# Patient Record
Sex: Female | Born: 1945
Health system: Southern US, Community
[De-identification: ages and names within clinical notes are randomized; demographics above are authoritative.]

## PROBLEM LIST (undated history)

## (undated) DIAGNOSIS — R9439 Abnormal result of other cardiovascular function study: Secondary | ICD-10-CM

## (undated) DIAGNOSIS — Z8719 Personal history of other diseases of the digestive system: Secondary | ICD-10-CM

## (undated) DIAGNOSIS — H269 Unspecified cataract: Secondary | ICD-10-CM

## (undated) DIAGNOSIS — R079 Chest pain, unspecified: Secondary | ICD-10-CM

## (undated) DIAGNOSIS — I10 Essential (primary) hypertension: Secondary | ICD-10-CM

## (undated) DIAGNOSIS — M1990 Unspecified osteoarthritis, unspecified site: Secondary | ICD-10-CM

## (undated) DIAGNOSIS — R011 Cardiac murmur, unspecified: Secondary | ICD-10-CM

## (undated) DIAGNOSIS — M79603 Pain in arm, unspecified: Secondary | ICD-10-CM

## (undated) DIAGNOSIS — R9431 Abnormal electrocardiogram [ECG] [EKG]: Secondary | ICD-10-CM

## (undated) DIAGNOSIS — K219 Gastro-esophageal reflux disease without esophagitis: Secondary | ICD-10-CM

## (undated) DIAGNOSIS — J811 Chronic pulmonary edema: Secondary | ICD-10-CM

## (undated) HISTORY — DX: Pain in arm, unspecified: M79.603

## (undated) HISTORY — PX: CHOLECYSTECTOMY: SHX55

## (undated) HISTORY — PX: HERNIA REPAIR: SHX51

## (undated) HISTORY — PX: DILATION AND CURETTAGE OF UTERUS: SHX78

## (undated) HISTORY — DX: Chest pain, unspecified: R07.9

## (undated) HISTORY — PX: BREAST EXCISIONAL BIOPSY: SUR124

## (undated) HISTORY — DX: Abnormal result of other cardiovascular function study: R94.39

## (undated) HISTORY — PX: ESOPHAGOGASTRODUODENOSCOPY ENDOSCOPY: SHX5814

## (undated) HISTORY — DX: Chronic pulmonary edema: J81.1

## (undated) HISTORY — DX: Abnormal electrocardiogram (ECG) (EKG): R94.31

## (undated) HISTORY — PX: BREAST SURGERY: SHX581

## (undated) HISTORY — PX: COLONOSCOPY: SHX174

## (undated) HISTORY — PX: EYE SURGERY: SHX253

## (undated) HISTORY — PX: ABDOMINAL HYSTERECTOMY: SHX81

## (undated) HISTORY — PX: OTHER SURGICAL HISTORY: SHX169

---

## 1998-10-25 ENCOUNTER — Emergency Department (HOSPITAL_COMMUNITY): Admission: EM | Admit: 1998-10-25 | Discharge: 1998-10-25 | Payer: Self-pay | Admitting: Emergency Medicine

## 1998-11-16 ENCOUNTER — Emergency Department (HOSPITAL_COMMUNITY): Admission: EM | Admit: 1998-11-16 | Discharge: 1998-11-16 | Payer: Self-pay | Admitting: Emergency Medicine

## 1998-11-16 ENCOUNTER — Encounter: Payer: Self-pay | Admitting: Emergency Medicine

## 1998-12-02 ENCOUNTER — Emergency Department (HOSPITAL_COMMUNITY): Admission: EM | Admit: 1998-12-02 | Discharge: 1998-12-02 | Payer: Self-pay | Admitting: Emergency Medicine

## 1998-12-02 ENCOUNTER — Encounter: Payer: Self-pay | Admitting: Emergency Medicine

## 1999-02-16 ENCOUNTER — Ambulatory Visit (HOSPITAL_COMMUNITY): Admission: RE | Admit: 1999-02-16 | Discharge: 1999-02-16 | Payer: Self-pay

## 1999-11-20 ENCOUNTER — Ambulatory Visit (HOSPITAL_COMMUNITY): Admission: RE | Admit: 1999-11-20 | Discharge: 1999-11-20 | Payer: Self-pay | Admitting: Family Medicine

## 2000-04-19 ENCOUNTER — Ambulatory Visit (HOSPITAL_COMMUNITY): Admission: RE | Admit: 2000-04-19 | Discharge: 2000-04-19 | Payer: Self-pay | Admitting: Internal Medicine

## 2000-04-19 ENCOUNTER — Encounter: Payer: Self-pay | Admitting: Internal Medicine

## 2000-06-02 ENCOUNTER — Encounter: Payer: Self-pay | Admitting: Emergency Medicine

## 2000-06-02 ENCOUNTER — Encounter: Payer: Self-pay | Admitting: Internal Medicine

## 2000-06-02 ENCOUNTER — Inpatient Hospital Stay (HOSPITAL_COMMUNITY): Admission: EM | Admit: 2000-06-02 | Discharge: 2000-06-03 | Payer: Self-pay | Admitting: Emergency Medicine

## 2000-06-02 IMAGING — MR MR HEAD WO/W CM
9 of 11 series · 36 of 48 positions shown · IV contrast (20CC  OMNISCAN)
Comparison: none

FINDINGS
CLINICAL DATA: CHEST PAIN, SEVERE HEADACHES FOR A FEW DAYS. EVALUATE FOR ACUTE INTRACRANIAL
ABNORMALITY IN THIS 54-YEAR-OLD LADY.  REPORTEDLY NEGATIVE CT SCAN.
MRI OF THE BRAIN WITH AND WITHOUT IV CONTRAST:
MULTIPLANAR, MULTISEQUENCE IMAGES WERE ACQUIRED ACCORDING TO STANDARD PROTOCOL PRIOR TO AND
FOLLOWING IV INJECTION OF 20 CC OF OMNISCAN.
VERTEBRAL, BASILAR, AND INTERNAL CAROTID ARTERIES ARE PATENT. THE CRANIOVERTEBRAL JUNCTION APPEARS
UNREMARKABLE. DIFFUSION IMAGES ARE NEGATIVE FOR ACUTE/SUBACUTE INFARCTION. NO ABNORMAL CONTRAST
ENHANCEMENT. THERE ARE A FEW MINUTE FOCI OF INCREASED T2 PROLONGATION INVOLVING THE WHITE MATTER OF
THE FRONTAL AND PARIETAL LOBES IN THEIR MID TO UPPER ASPECTS, AND ALSO THE POSTERIOR PARIETO-
OCCIPITAL WHITE MATTER. THE FINDINGS ARE NOT REMARKABLE FOR THE PATIENT'S AGE AND ARE LIKELY A
RESULT OF CHRONIC SMALL VESSEL DISEASE.  DURAL VENOUS SINUSES APPEAR PATENT. THERE IS NO MR
EVIDENCE FOR HEMORRHAGE. 2D TIME-OF-FLIGHT IMAGES TO EVALUATE THE DURAL VEINS REVEAL NO THROMBI OR
VENOUS OBSTRUCTION. RIGHT TRANSVERSE SINUS APPEARS TO BE HYPOPLASTIC. LEFT TRANSVERSE SINUS IS
DOMINANT. THIS REPRESENTS A NORMAL ANATOMIC VARIATION. THE PARANASAL AND MASTOID SINUSES ARE
AERATED.
IMPRESSION
FINDINGS ARE COMPATIBLE WITH INCIDENTAL MILD CHANGES OF SMALL VESSEL DISEASE INVOLVING THE CEREBRAL
WHITE MATTER. EXAMINATION IS OTHERWISE UNREMARKABLE.

[Series 1: T1 · sagittal · 5.0mm · 0.43mm/px · 2 of 12 slices shown (1 of 2)]
[im 1/12]
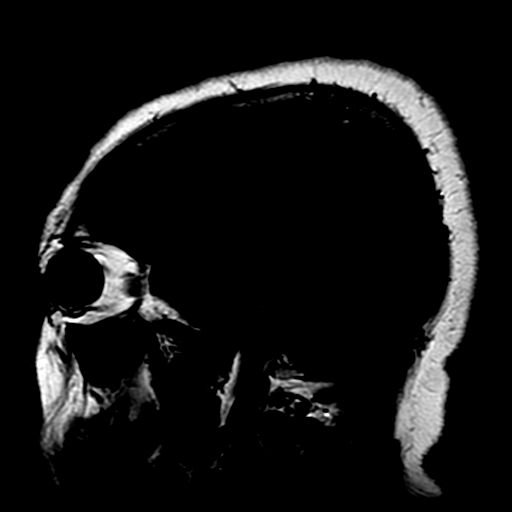
[im 12/12]
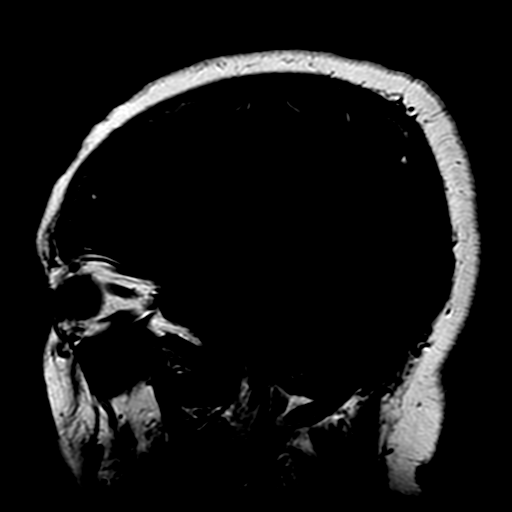

[Series 2: DWI · axial · 5.0mm · 1.25mm/px · z∈[-61,+72]mm · 5 of 40 slices shown]
[im 1/40]
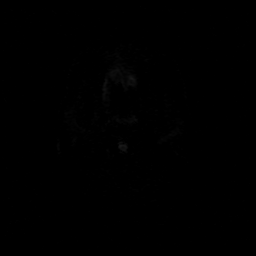
[im 10/40]
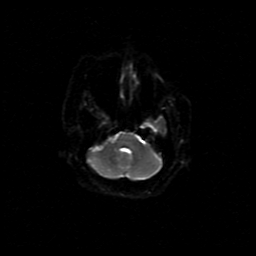
[im 20/40]
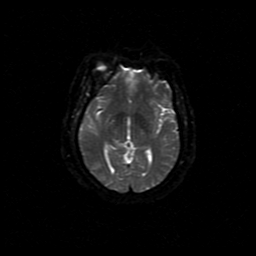
[im 30/40]
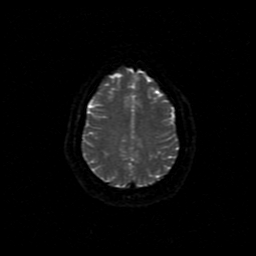
[im 40/40]
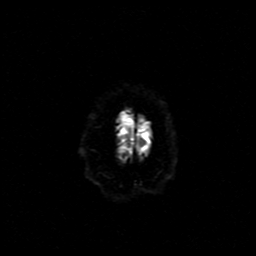

[Series 3: T2 · axial · 5.0mm · 0.43mm/px · z∈[-62,+72]mm · 3 of 20 slices shown (1 of 3)]
[im 1/20]
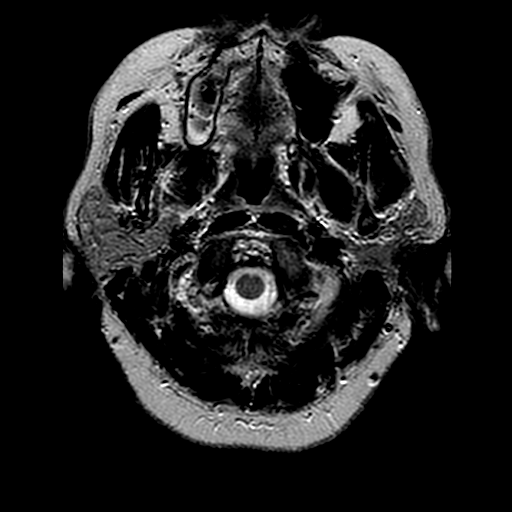
[im 10/20]
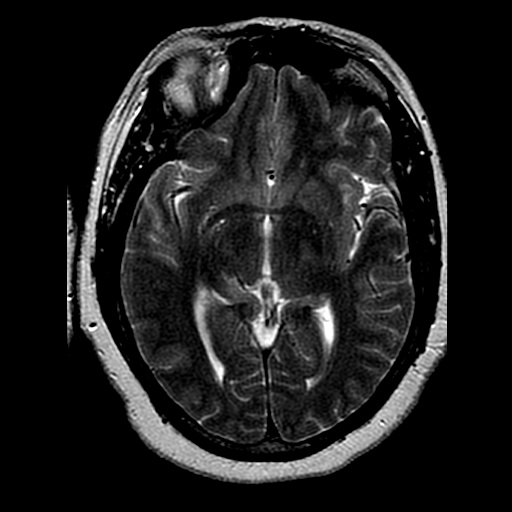
[im 20/20]
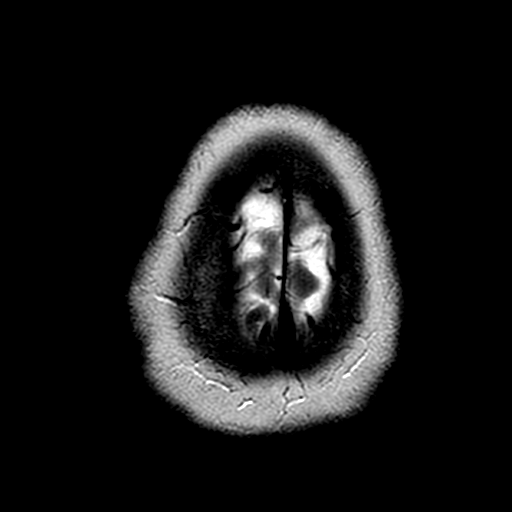

[Series 4: mpgr cor (blood) · coronal · 5.0mm · 0.86mm/px · 5 of 40 slices shown]
[im 1/40]
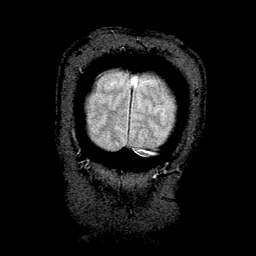
[im 10/40]
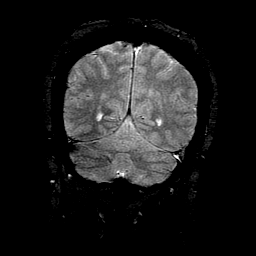
[im 20/40]
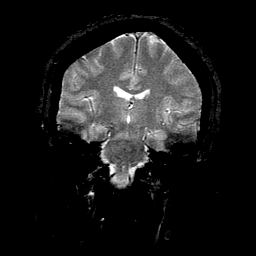
[im 30/40]
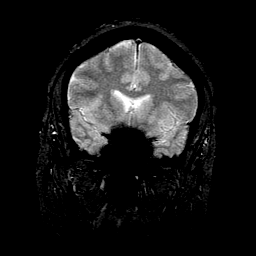
[im 40/40]
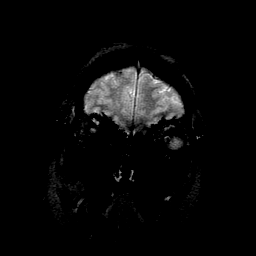

[Series 5: FLAIR · axial · 5.0mm · 0.86mm/px · z∈[-49,+82]mm · 3 of 20 slices shown]
[im 1/20]
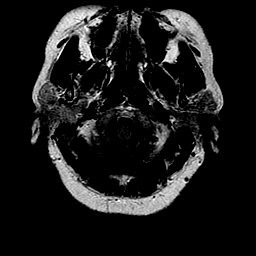
[im 10/20]
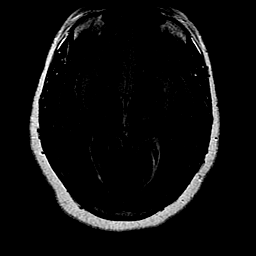
[im 20/20]
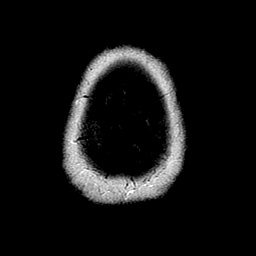

[Series 6: T2 · axial · 5.0mm · 0.43mm/px · z∈[-49,+82]mm · 3 of 20 slices shown (2 of 3)]
[im 1/20]
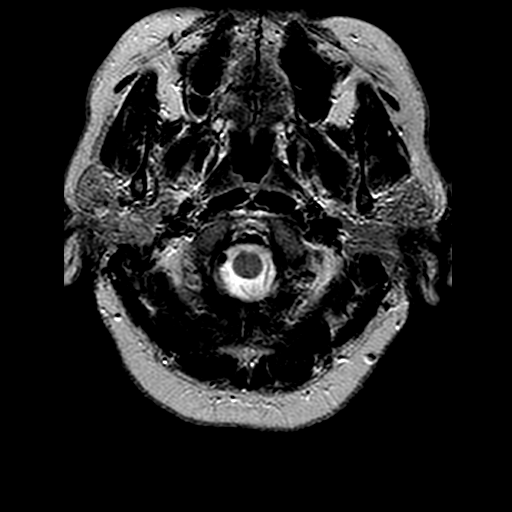
[im 10/20]
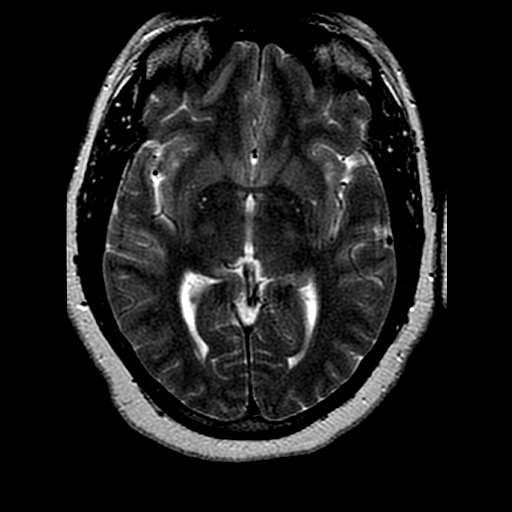
[im 20/20]
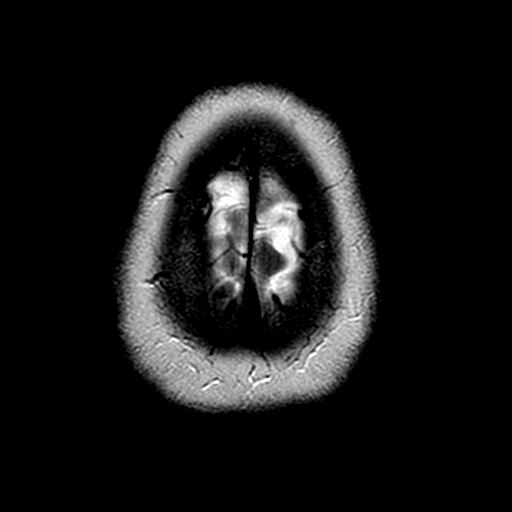

[Series 7: T2 · coronal · 5.0mm · 0.43mm/px · 3 of 22 slices shown (3 of 3)]
[im 1/22]
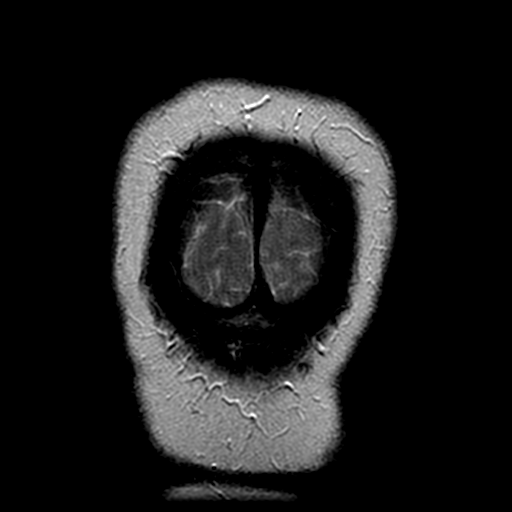
[im 11/22]
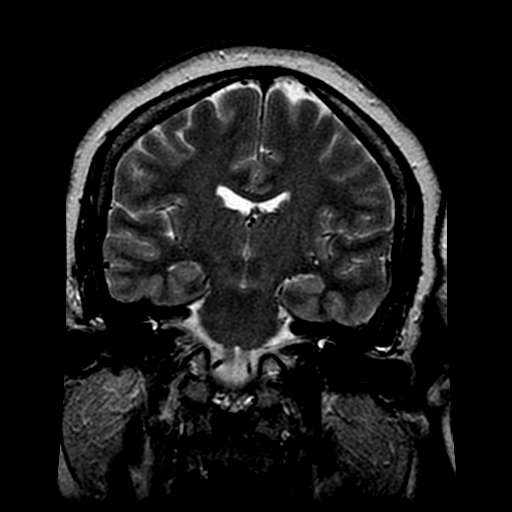
[im 22/22]
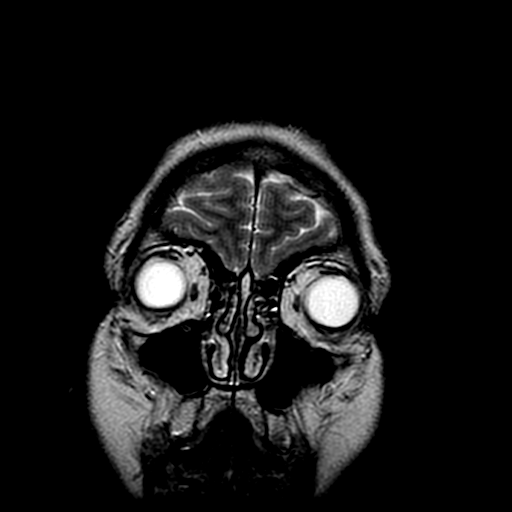

[Series 9: cor (id) spgr · coronal · 2.0mm · 0.86mm/px · 9 of 64 slices shown]
[im 1/64]
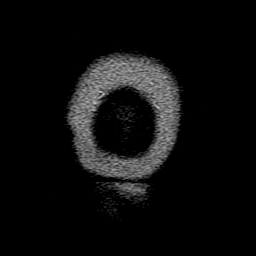
[im 8/64]
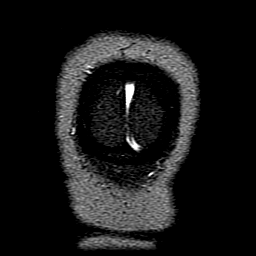
[im 16/64]
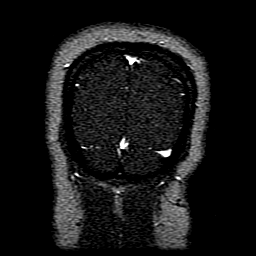
[im 24/64]
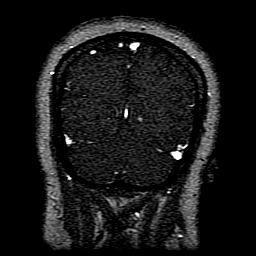
[im 32/64]
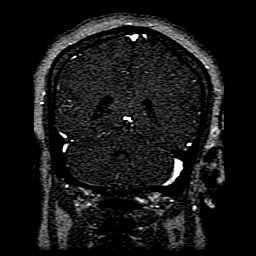
[im 40/64]
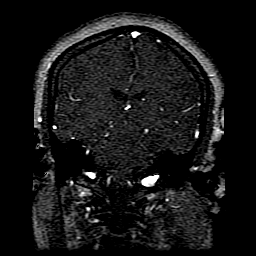
[im 48/64]
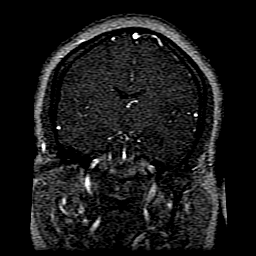
[im 56/64]
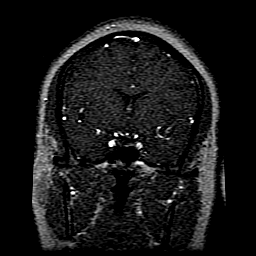
[im 64/64]
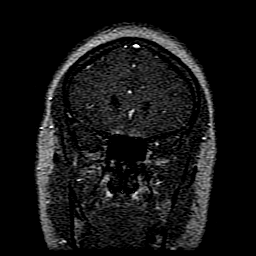

[Series 11: T1 · coronal · 5.0mm · 0.86mm/px · 3 of 22 slices shown (2 of 2)]
[im 1/22]
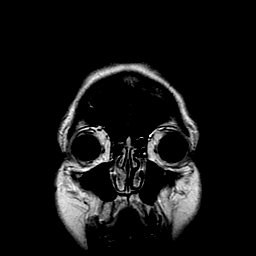
[im 11/22]
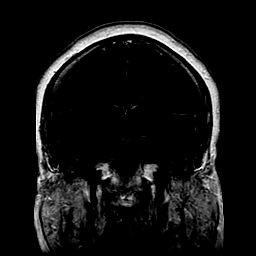
[im 22/22]
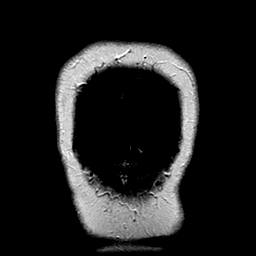

[36 of 48 positions shown; findings below may reference images not displayed]

## 2000-06-03 ENCOUNTER — Encounter: Payer: Self-pay | Admitting: Internal Medicine

## 2000-06-14 ENCOUNTER — Ambulatory Visit (HOSPITAL_COMMUNITY): Admission: RE | Admit: 2000-06-14 | Discharge: 2000-06-14 | Payer: Self-pay | Admitting: Internal Medicine

## 2000-06-14 ENCOUNTER — Encounter: Payer: Self-pay | Admitting: Internal Medicine

## 2000-09-05 ENCOUNTER — Other Ambulatory Visit: Admission: RE | Admit: 2000-09-05 | Discharge: 2000-09-05 | Payer: Self-pay | Admitting: Obstetrics and Gynecology

## 2001-03-03 ENCOUNTER — Encounter: Payer: Self-pay | Admitting: Gastroenterology

## 2001-03-03 ENCOUNTER — Ambulatory Visit (HOSPITAL_COMMUNITY): Admission: RE | Admit: 2001-03-03 | Discharge: 2001-03-03 | Payer: Self-pay | Admitting: Gastroenterology

## 2001-03-25 ENCOUNTER — Emergency Department (HOSPITAL_COMMUNITY): Admission: EM | Admit: 2001-03-25 | Discharge: 2001-03-25 | Payer: Self-pay | Admitting: Emergency Medicine

## 2001-04-05 ENCOUNTER — Ambulatory Visit (HOSPITAL_COMMUNITY): Admission: RE | Admit: 2001-04-05 | Discharge: 2001-04-05 | Payer: Self-pay | Admitting: Gastroenterology

## 2001-05-06 ENCOUNTER — Emergency Department (HOSPITAL_COMMUNITY): Admission: EM | Admit: 2001-05-06 | Discharge: 2001-05-07 | Payer: Self-pay | Admitting: Emergency Medicine

## 2001-11-14 ENCOUNTER — Ambulatory Visit (HOSPITAL_COMMUNITY): Admission: RE | Admit: 2001-11-14 | Discharge: 2001-11-14 | Payer: Self-pay | Admitting: Internal Medicine

## 2001-11-14 ENCOUNTER — Encounter: Payer: Self-pay | Admitting: Internal Medicine

## 2001-11-15 ENCOUNTER — Encounter: Payer: Self-pay | Admitting: Internal Medicine

## 2001-11-15 ENCOUNTER — Encounter: Admission: RE | Admit: 2001-11-15 | Discharge: 2001-11-15 | Payer: Self-pay | Admitting: Internal Medicine

## 2001-12-13 ENCOUNTER — Encounter: Payer: Self-pay | Admitting: Emergency Medicine

## 2001-12-13 ENCOUNTER — Emergency Department (HOSPITAL_COMMUNITY): Admission: EM | Admit: 2001-12-13 | Discharge: 2001-12-13 | Payer: Self-pay | Admitting: Emergency Medicine

## 2002-02-26 ENCOUNTER — Emergency Department (HOSPITAL_COMMUNITY): Admission: EM | Admit: 2002-02-26 | Discharge: 2002-02-26 | Payer: Self-pay | Admitting: Emergency Medicine

## 2002-02-26 ENCOUNTER — Encounter: Payer: Self-pay | Admitting: Emergency Medicine

## 2002-05-03 ENCOUNTER — Other Ambulatory Visit: Admission: RE | Admit: 2002-05-03 | Discharge: 2002-05-03 | Payer: Self-pay | Admitting: Obstetrics and Gynecology

## 2002-06-25 ENCOUNTER — Encounter (INDEPENDENT_AMBULATORY_CARE_PROVIDER_SITE_OTHER): Payer: Self-pay | Admitting: Specialist

## 2002-06-25 ENCOUNTER — Inpatient Hospital Stay (HOSPITAL_COMMUNITY): Admission: RE | Admit: 2002-06-25 | Discharge: 2002-06-27 | Payer: Self-pay | Admitting: Obstetrics and Gynecology

## 2002-06-30 ENCOUNTER — Inpatient Hospital Stay (HOSPITAL_COMMUNITY): Admission: AD | Admit: 2002-06-30 | Discharge: 2002-06-30 | Payer: Self-pay | Admitting: Obstetrics and Gynecology

## 2003-01-15 ENCOUNTER — Encounter: Payer: Self-pay | Admitting: Obstetrics and Gynecology

## 2003-01-15 ENCOUNTER — Ambulatory Visit (HOSPITAL_COMMUNITY): Admission: RE | Admit: 2003-01-15 | Discharge: 2003-01-15 | Payer: Self-pay | Admitting: Obstetrics and Gynecology

## 2003-04-16 ENCOUNTER — Encounter: Admission: RE | Admit: 2003-04-16 | Discharge: 2003-04-16 | Payer: Self-pay | Admitting: Internal Medicine

## 2003-04-16 ENCOUNTER — Encounter: Payer: Self-pay | Admitting: Internal Medicine

## 2003-11-18 ENCOUNTER — Encounter: Admission: RE | Admit: 2003-11-18 | Discharge: 2003-11-18 | Payer: Self-pay | Admitting: Internal Medicine

## 2003-11-18 IMAGING — US US ABDOMEN COMPLETE
1 series · 13 of 25 positions shown · IV contrast (agent unspecified)
Comparison: none

CLINICAL DATA: Diffuse abdominal pain and right flank pain.  History of hernia repair.
ULTRASOUND ABDOMEN COMPLETE
The liver is slightly echogenic in echotexture without a focal lesion.  There is no intra- or extrahepatic biliary ductal dilatation.  The gallbladder has a normal appearance.
The pancreas is slightly inhomogeneous in echotexture.  There is slight prominence of the pancreatic head but the size of the pancreatic head is probably not changed from the prior contrast CT of the abdomen on 04/16/03.  As needed, further evaluation with contrast enhanced pancreatic protocol CT could provide additional characterization of the pancreatic head.
The kidneys have a normal appearance.  The spleen is difficult to visualize because of overlying bowel gas.  No hydronephrosis is seen.  The abdominal aorta is not aneurysmal.  The inferior vena cava is not distended.
IMPRESSION
1.  Echogenic liver parenchyma is consistent with fatty infiltration.
2.  Slightly inhomogeneous pancreas with prominence of the pancreatic head though the pancreatic head size is probably not changed since CT of the abdomen in April 2003.  As needed, a pancreatic protocol CT could be helpful especially if there is clinical concern regarding pancreatic abnormality.

[Series 1: unknown · 13 of 54 slices shown]
[im 1/54]
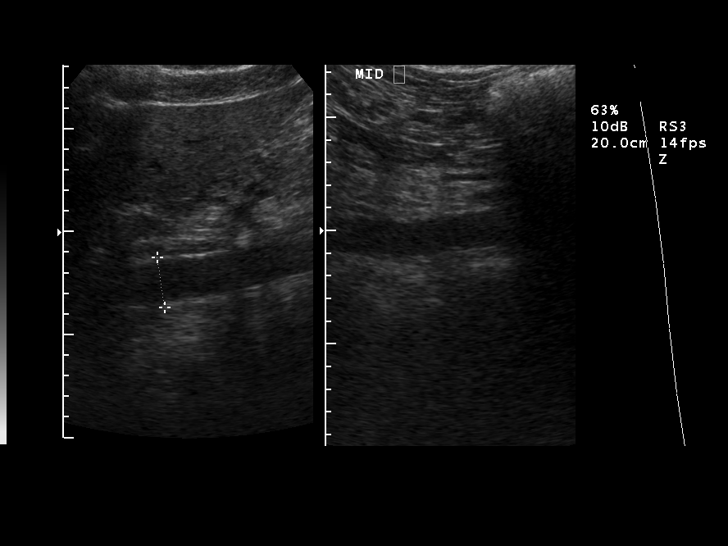
[im 5/54]
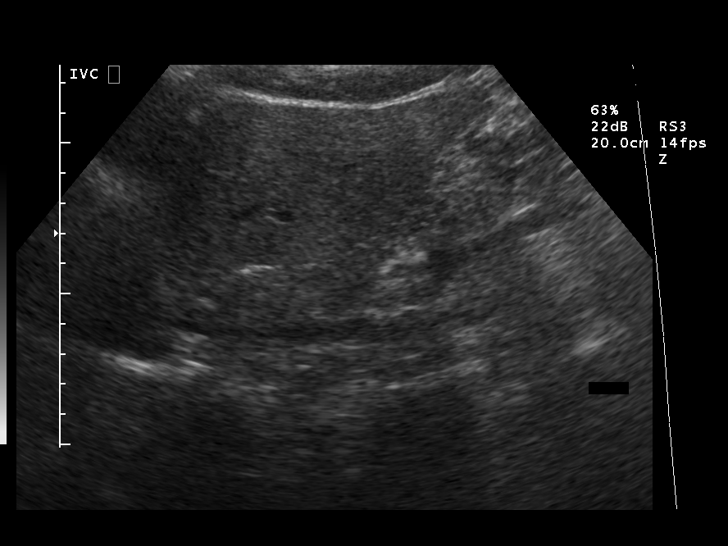
[im 9/54]
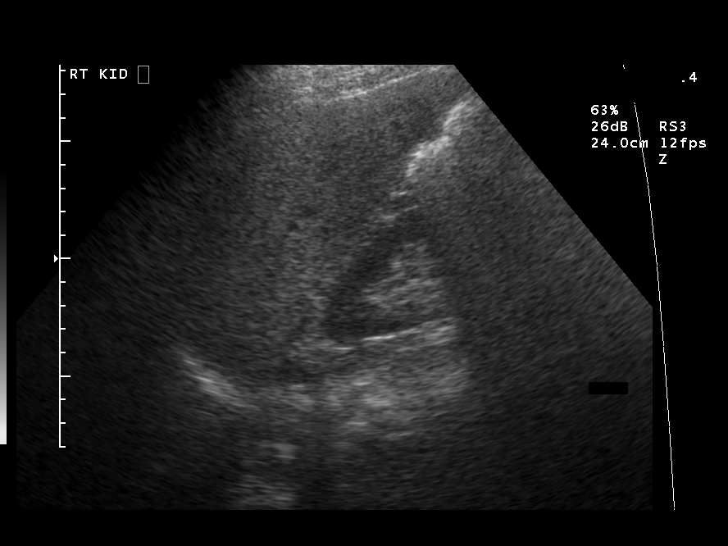
[im 14/54]
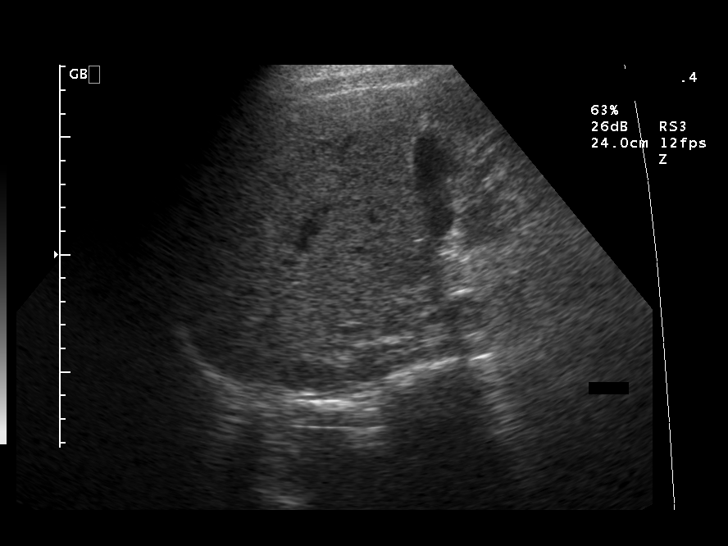
[im 18/54]
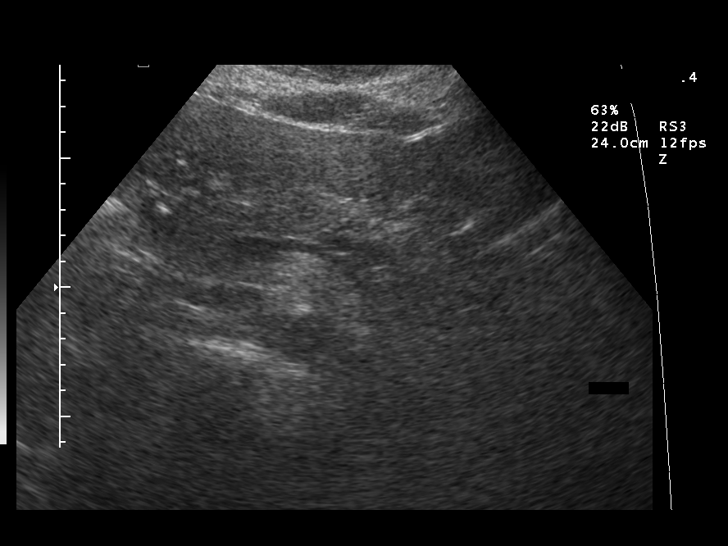
[im 23/54]
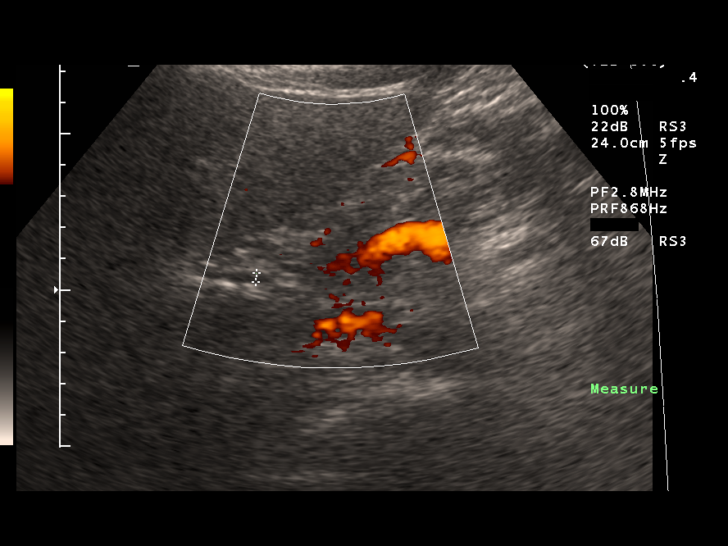
[im 27/54]
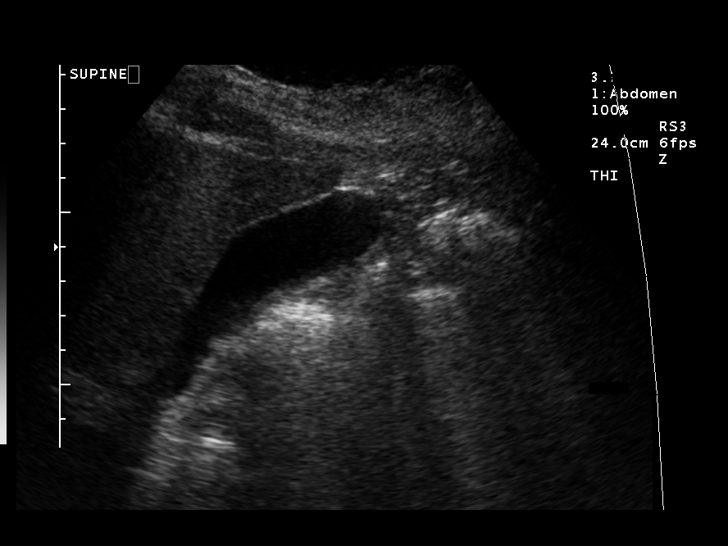
[im 31/54]
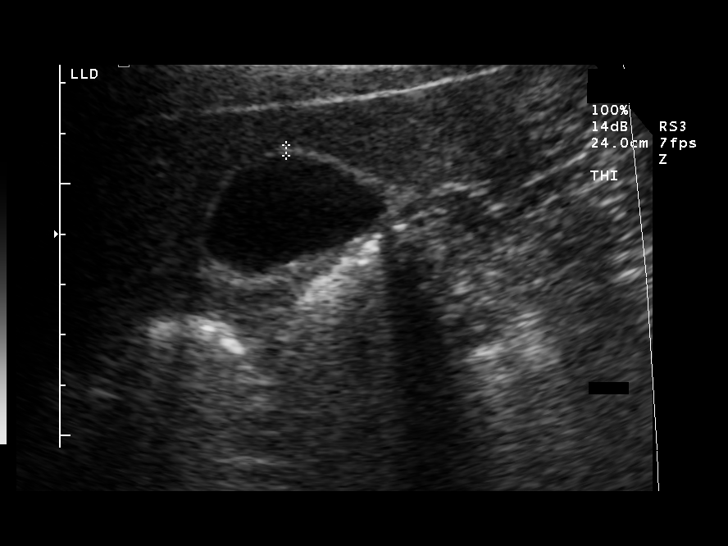
[im 36/54]
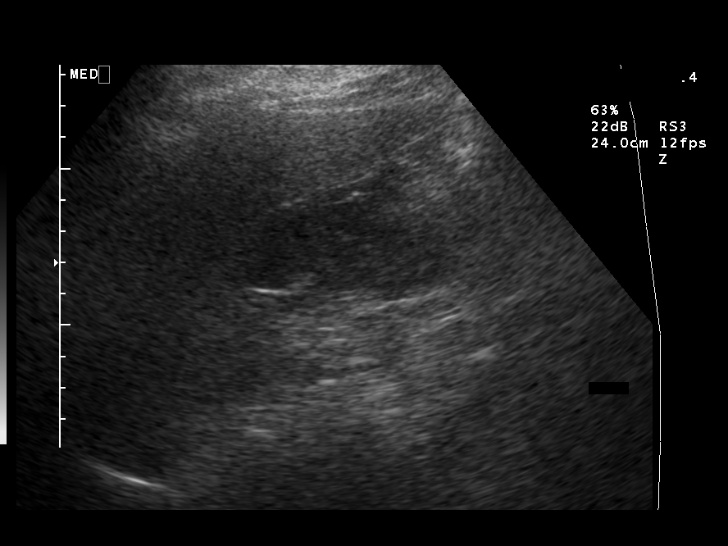
[im 40/54]
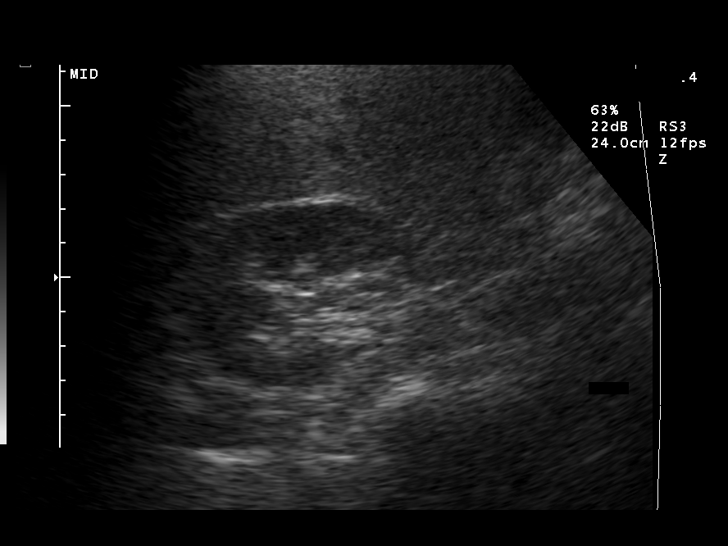
[im 45/54]
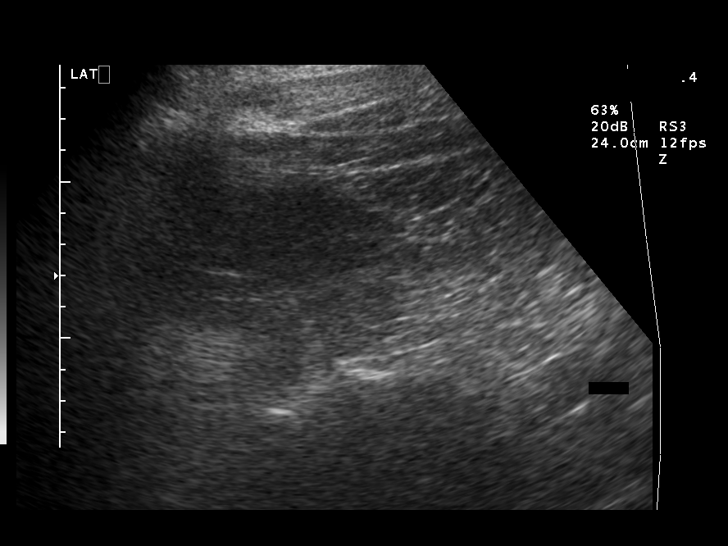
[im 49/54]
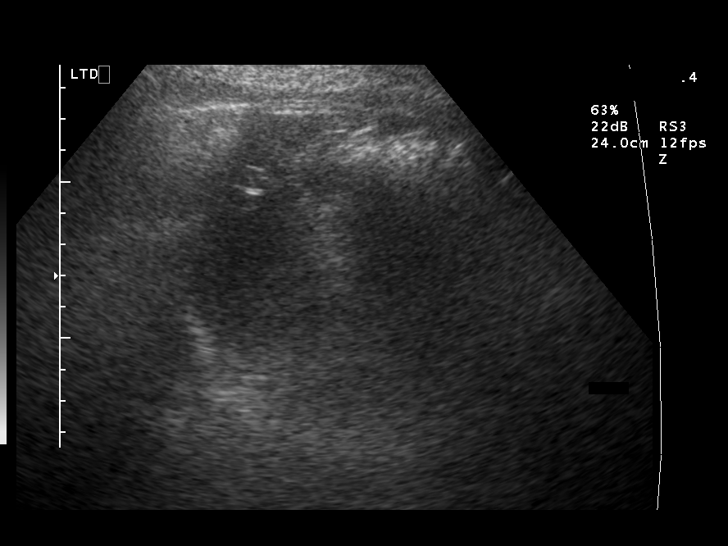
[im 54/54]
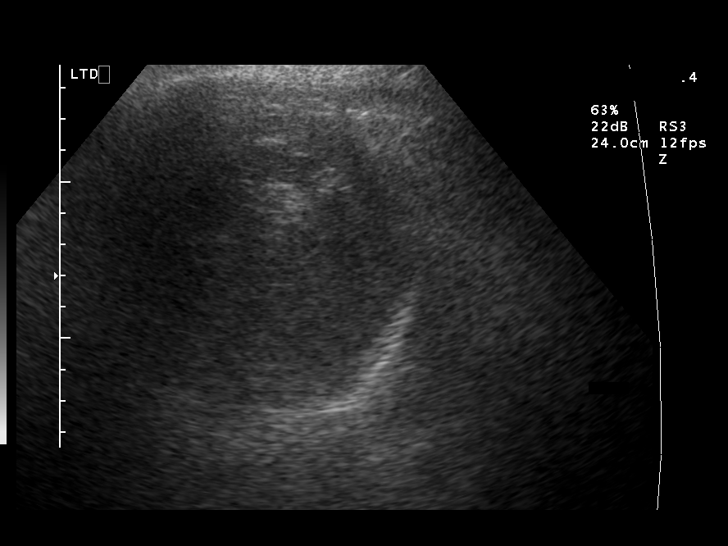

[13 of 25 positions shown; findings below may reference images not displayed]

## 2003-12-03 ENCOUNTER — Other Ambulatory Visit: Admission: RE | Admit: 2003-12-03 | Discharge: 2003-12-03 | Payer: Self-pay | Admitting: Obstetrics and Gynecology

## 2004-01-16 ENCOUNTER — Ambulatory Visit (HOSPITAL_COMMUNITY): Admission: RE | Admit: 2004-01-16 | Discharge: 2004-01-16 | Payer: Self-pay | Admitting: Obstetrics and Gynecology

## 2004-03-23 ENCOUNTER — Emergency Department (HOSPITAL_COMMUNITY): Admission: EM | Admit: 2004-03-23 | Discharge: 2004-03-23 | Payer: Self-pay | Admitting: Emergency Medicine

## 2004-03-23 IMAGING — CT CT HEAD W/O CM
1 of 2 series · 13 of 30 positions shown, 17 images · non-contrast
Comparison: none

CLINICAL DATA: Vertigo and blurred vision.
 CT OF THE HEAD WITHOUT CONTRAST ? 03/23/04
 Comparing report from MRI of the brain dated 06/02/00.

[Series 2: brain · axial · 0.47mm/px · z∈[+160,+280]mm · 13 of 28 slices shown, 17 images]
[im 2/28  brain]
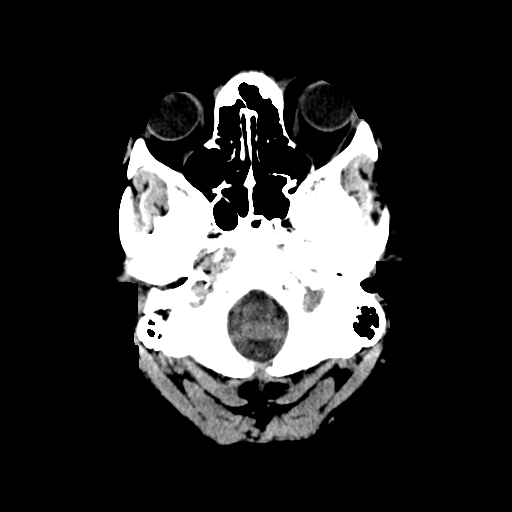
[im 2/28  bone]
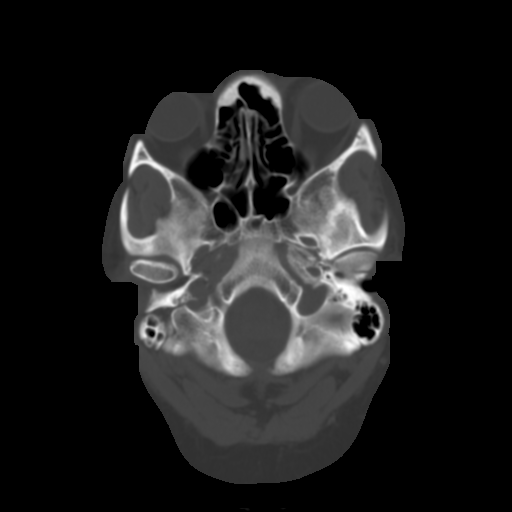
[im 4/28  brain]
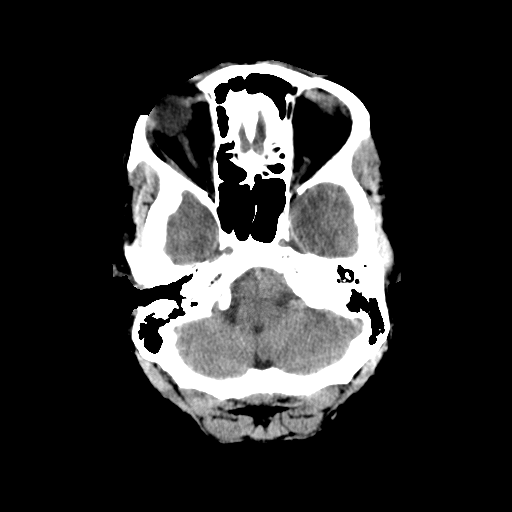
[im 6/28  brain]
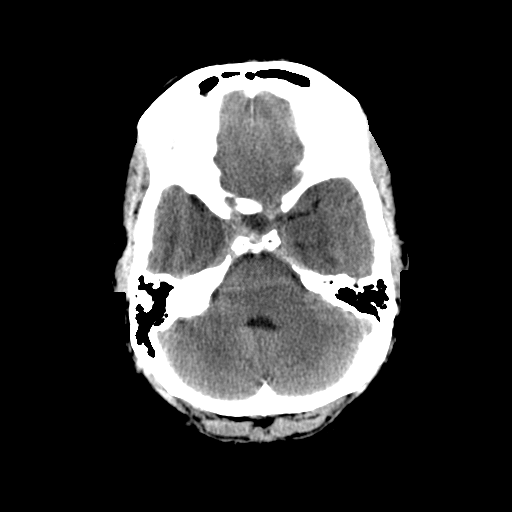
[im 8/28  brain]
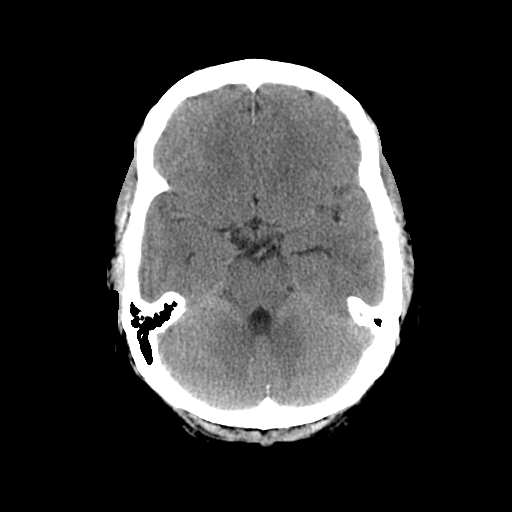
[im 10/28  brain]
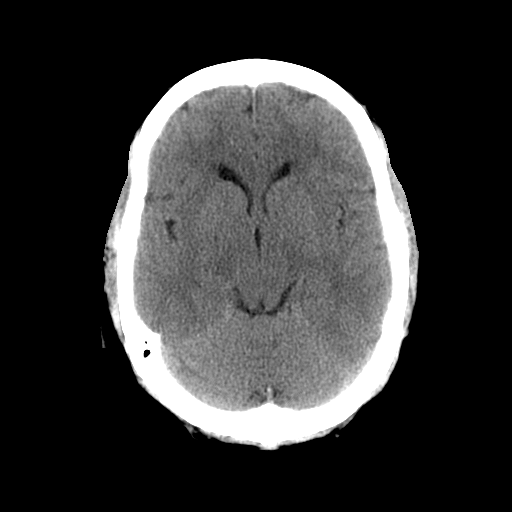
[im 10/28  bone]
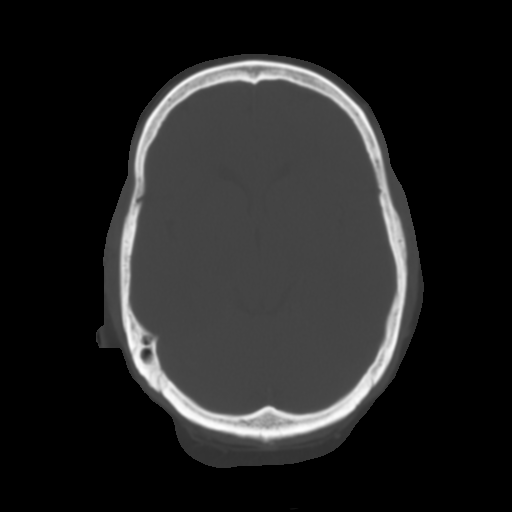
[im 12/28  brain]
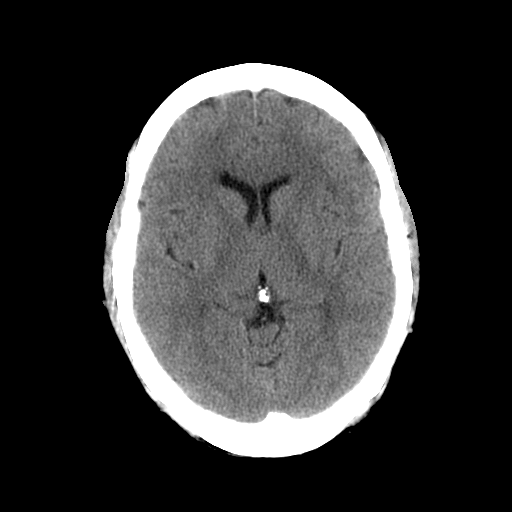
[im 14/28  brain]
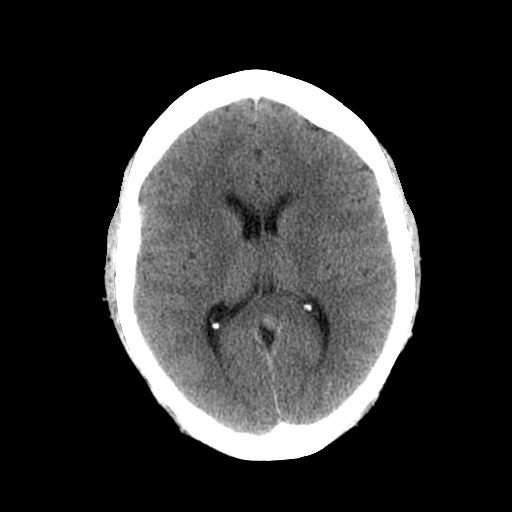
[im 16/28  brain]
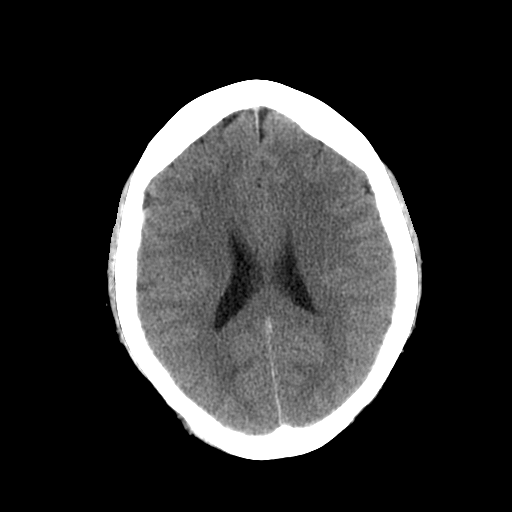
[im 18/28  brain]
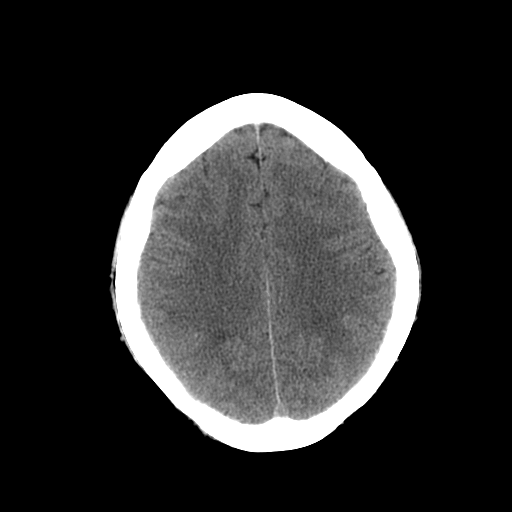
[im 18/28  bone]
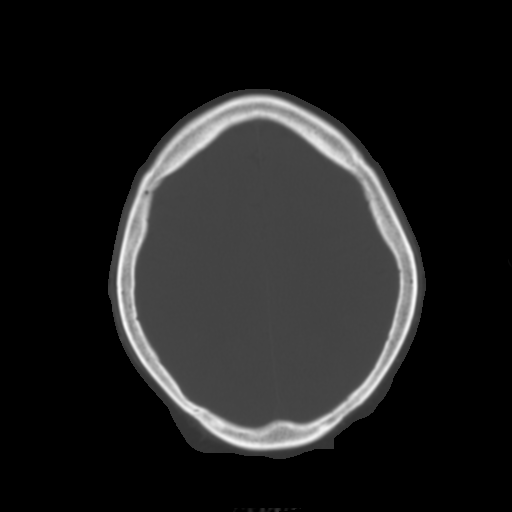
[im 20/28  brain]
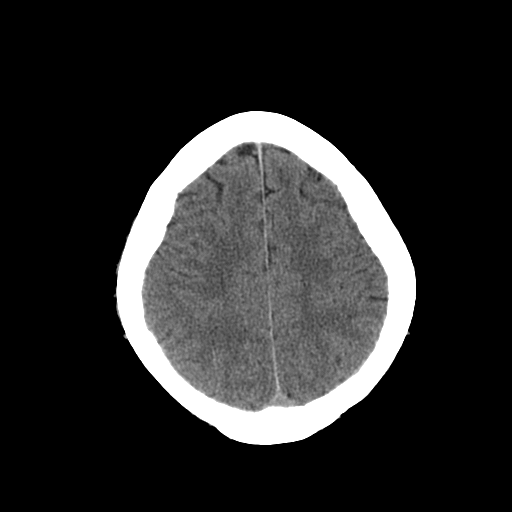
[im 22/28  brain]
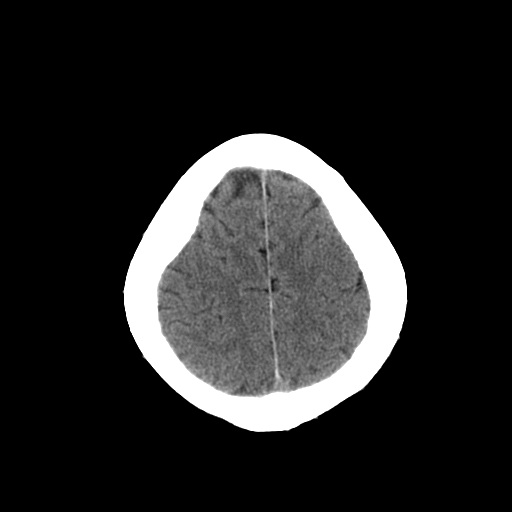
[im 24/28  brain]
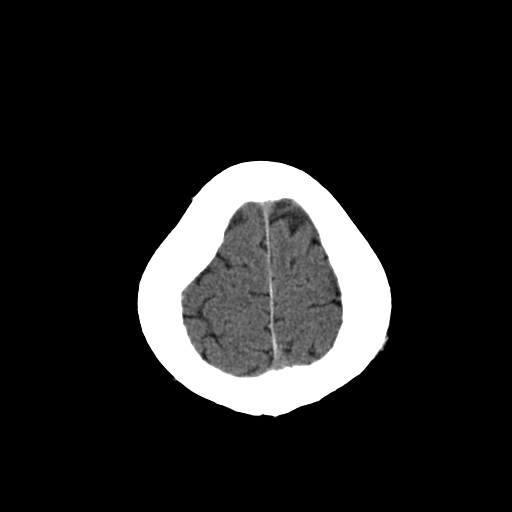
[im 26/28  brain]
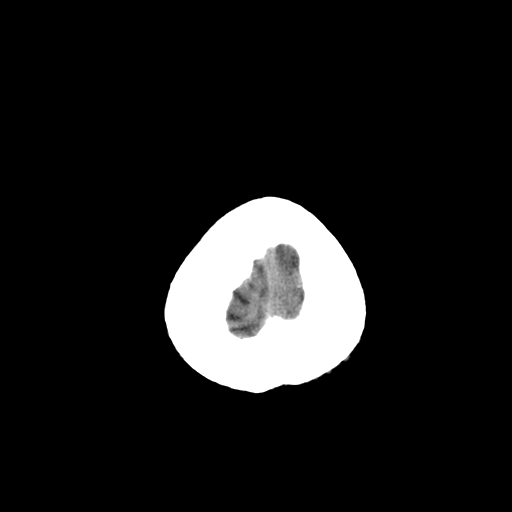
[im 26/28  bone]
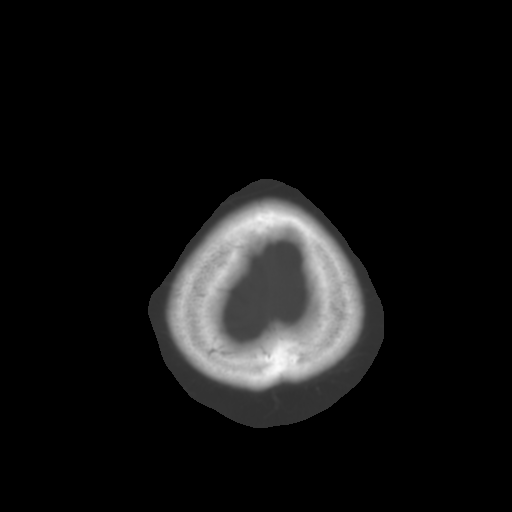

[13 of 30 positions shown; findings below may reference images not displayed]

FINDINGS: The posterior fossa structures appear normal.  No significant intracranial findings.  The internal auditory canals appear normal.  No evidence of otitis media or mastoiditis.  No cerebellopontine angle mass is identified.  
 IMPRESSION
 No significant intracranial findings.  No findings to explain the patient?s recent vertigo and blurred vision.  MRI may be helpful if clinically warranted.

## 2004-04-16 ENCOUNTER — Encounter: Admission: RE | Admit: 2004-04-16 | Discharge: 2004-04-16 | Payer: Self-pay | Admitting: Internal Medicine

## 2004-04-16 IMAGING — MR MR HEAD W/O CM
8 series · 48 of 48 positions shown · non-contrast
Comparison: none

CLINICAL DATA: Dizziness left arm, weakness.
 MRI BRAIN WITHOUT CONTRAST
 We were unsuccessful in obtaining venous access for contrast.  Comparison MRI 06/02/00 from [email or web address]].
 Again noted are small scattered white matter hyperintensities bilaterally which are stable and are most likely due to small chronic infarcts.  No new lesions are identified and there is no mass. 
 IMPRESSION
 No significant change from 0000 and no acute abnormality.

[Series 21: f11: 2:routine_brain/t1_s · sagittal · 5.0mm · 0.90mm/px · 4 of 15 slices shown]
[im 1/15]
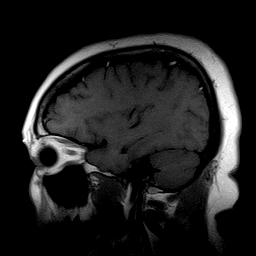
[im 5/15]
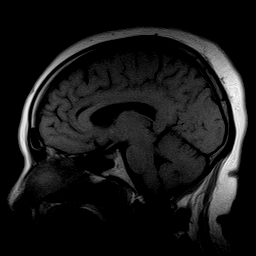
[im 10/15]
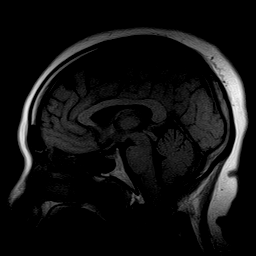
[im 15/15]
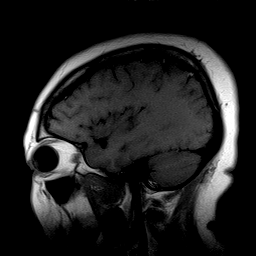

[Series 22: f11: 19:mean_s8_s9_s10_s11 · axial · 10.0mm · 2.03mm/px · z∈[-53,-13]mm · 2 of 5 slices shown]
[im 1/5]
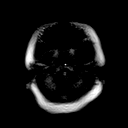
[im 5/5]
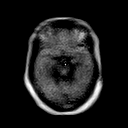

[Series 23: f11: 20:mean_s13_s14_s15_s · axial · 10.0mm · 2.03mm/px · z∈[-3,+37]mm · 2 of 5 slices shown]
[im 1/5]
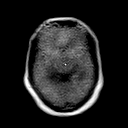
[im 5/5]
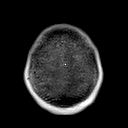

[Series 25: f11: 18:routine_brain/tse_ · axial · 5.0mm · 0.90mm/px · z∈[-73,+53]mm · 6 of 19 slices shown]
[im 1/19]
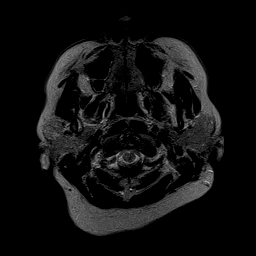
[im 4/19]
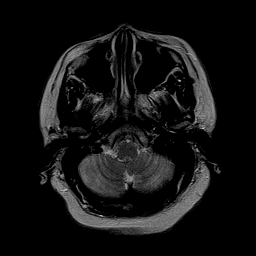
[im 8/19]
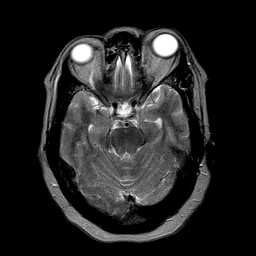
[im 11/19]
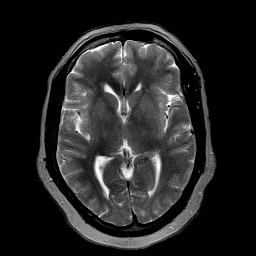
[im 15/19]
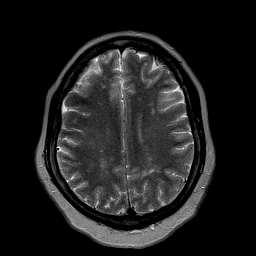
[im 19/19]
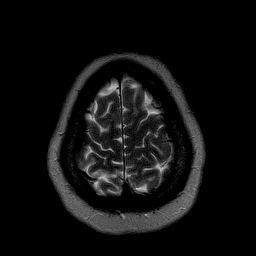

[Series 27: f11: 24:routine_brain/fl3d · axial · 4.0mm · 0.90mm/px · z∈[-70,+54]mm · 11 of 34 slices shown]
[im 1/34]
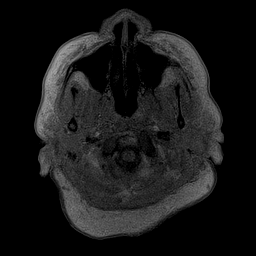
[im 4/34]
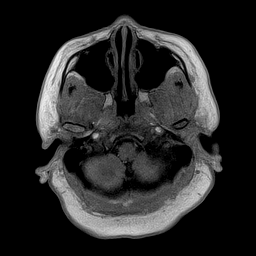
[im 7/34]
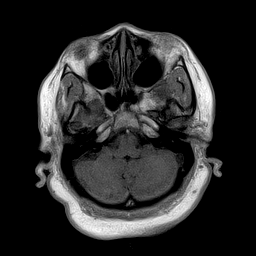
[im 10/34]
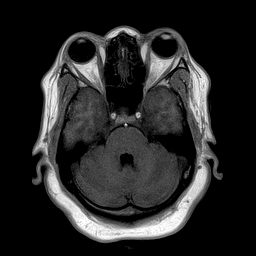
[im 14/34]
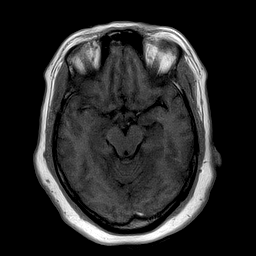
[im 17/34]
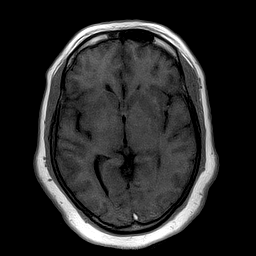
[im 20/34]
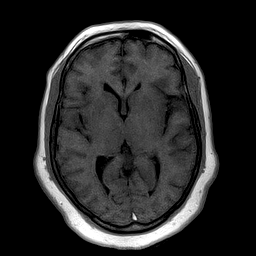
[im 24/34]
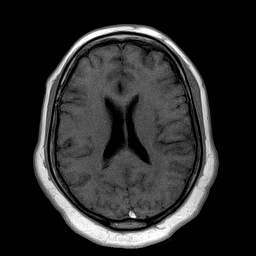
[im 27/34]
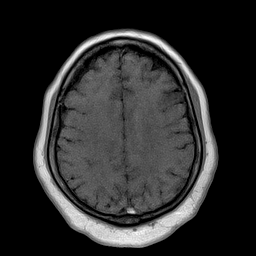
[im 30/34]
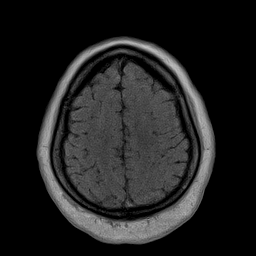
[im 34/34]
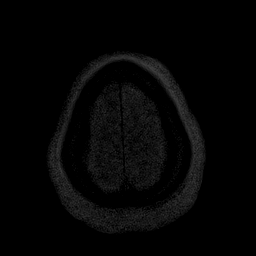

[Series 28: f11: 26:routine_brain/axia · axial · 6.0mm · 0.90mm/px · z∈[-72,+53]mm · 6 of 17 slices shown]
[im 1/17]
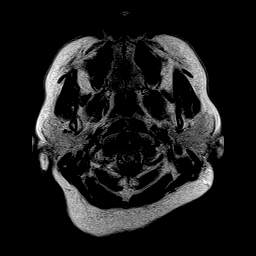
[im 4/17]
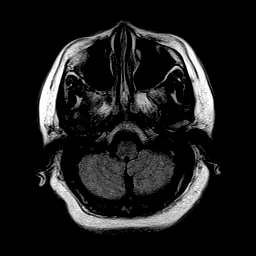
[im 7/17]
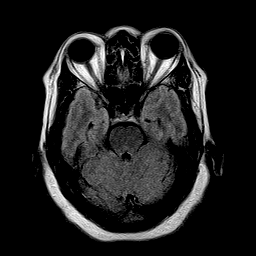
[im 10/17]
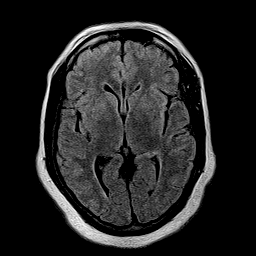
[im 13/17]
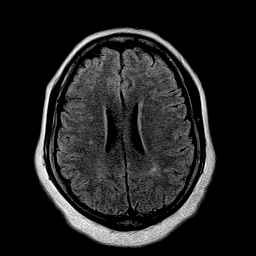
[im 17/17]
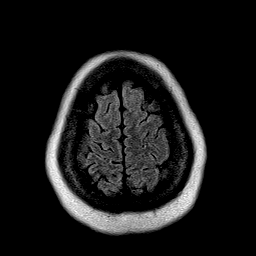

[Series 30: f11: 29:routine_brain/cor_ · coronal · 5.0mm · 0.90mm/px · 15 of 46 slices shown]
[im 1/46]
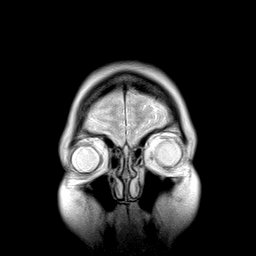
[im 4/46]
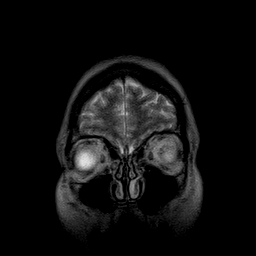
[im 7/46]
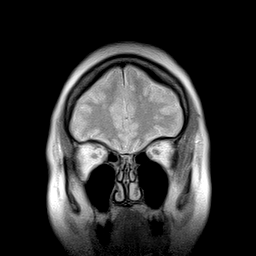
[im 10/46]
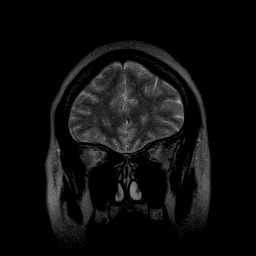
[im 13/46]
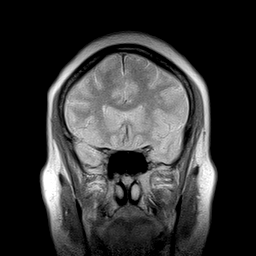
[im 17/46]
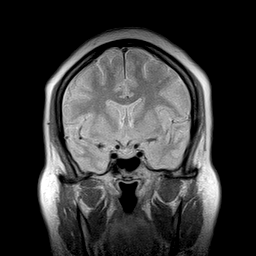
[im 20/46]
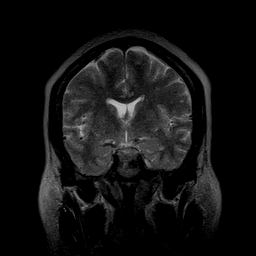
[im 23/46]
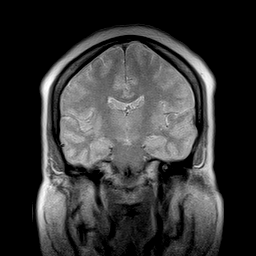
[im 26/46]
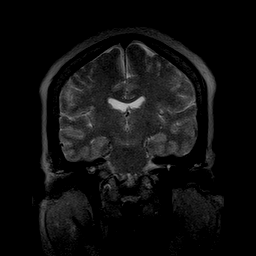
[im 29/46]
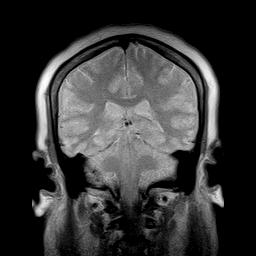
[im 33/46]
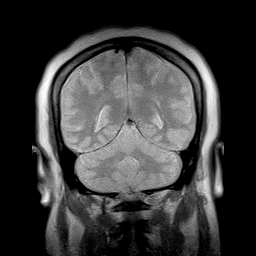
[im 36/46]
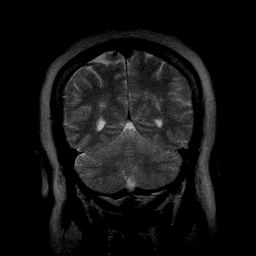
[im 39/46]
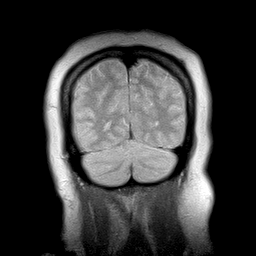
[im 42/46]
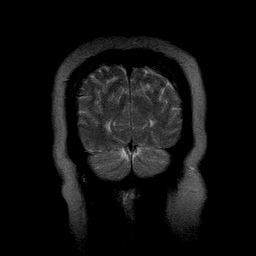
[im 46/46]
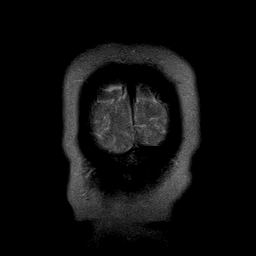

[Series 31: slice display · axial · 4.0mm · 0.90mm/px · z∈[-2,+150]mm · 2 of 7 slices shown]
[im 1/7]
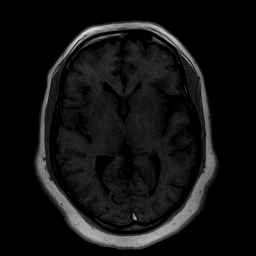
[im 7/7]
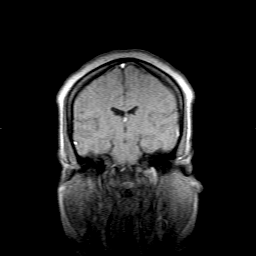

[48 of 48 positions shown; findings below may reference images not displayed]

## 2005-01-19 ENCOUNTER — Ambulatory Visit (HOSPITAL_COMMUNITY): Admission: RE | Admit: 2005-01-19 | Discharge: 2005-01-19 | Payer: Self-pay

## 2005-05-03 ENCOUNTER — Emergency Department (HOSPITAL_COMMUNITY): Admission: EM | Admit: 2005-05-03 | Discharge: 2005-05-03 | Payer: Self-pay | Admitting: Emergency Medicine

## 2005-05-03 IMAGING — CT CT HEAD W/O CM
1 of 2 series · 13 of 30 positions shown, 17 images · IV contrast (agent unspecified)
Comparison: 03/23/04 and MRI of the brain from 04/16/04.

CLINICAL DATA: Rt sided weakness and numbness.  
 HEAD CT WITHOUT CONTRAST:
TECHNIQUE: 5mm collimated images were obtained from the base of the skull through the vertex according to standard protocol without contrast.

[Series 2: brain · axial · 0.47mm/px · z∈[+130,+256]mm · 13 of 32 slices shown, 17 images]
[im 3/32  brain]
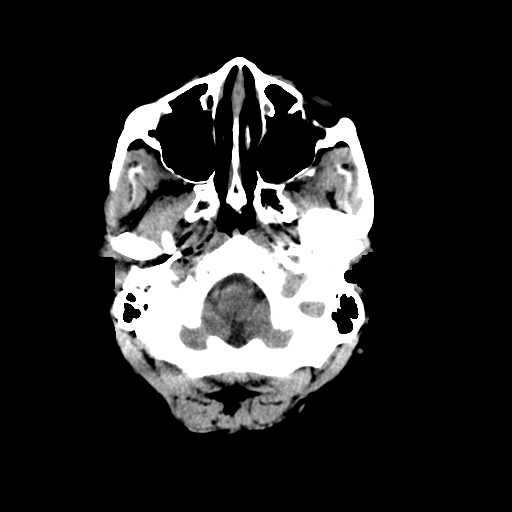
[im 3/32  bone]
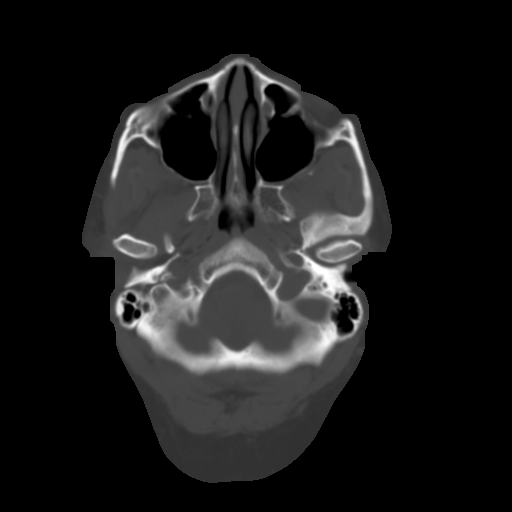
[im 5/32  brain]
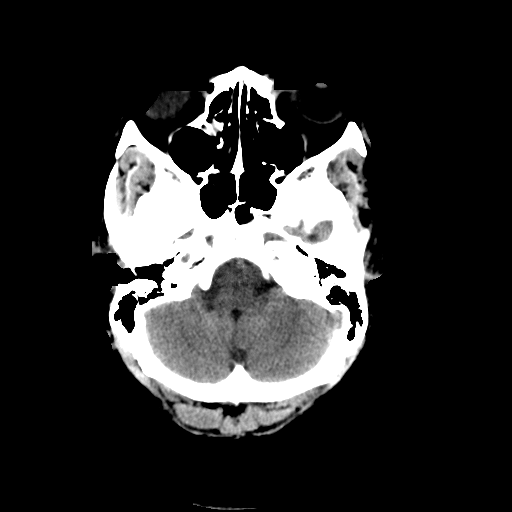
[im 7/32  brain]
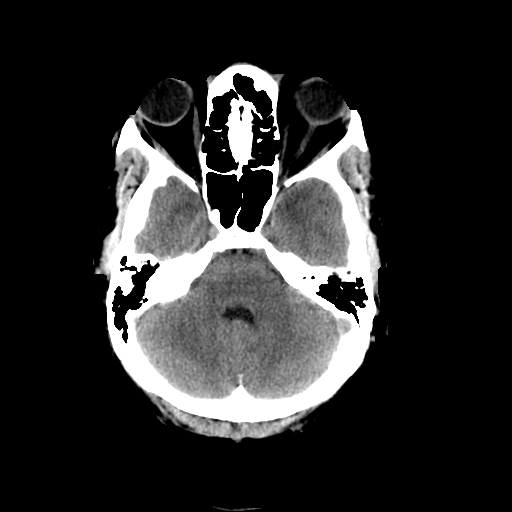
[im 9/32  brain]
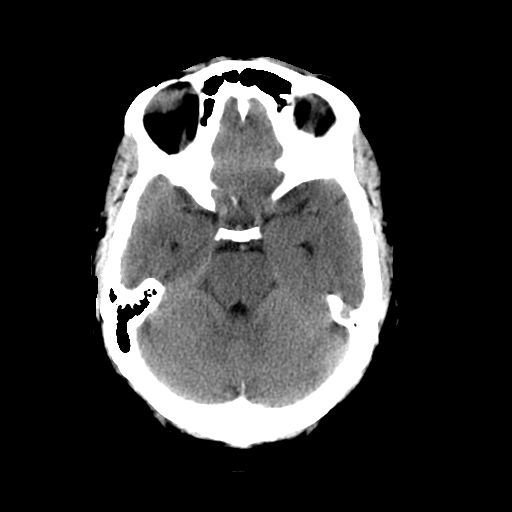
[im 12/32  brain]
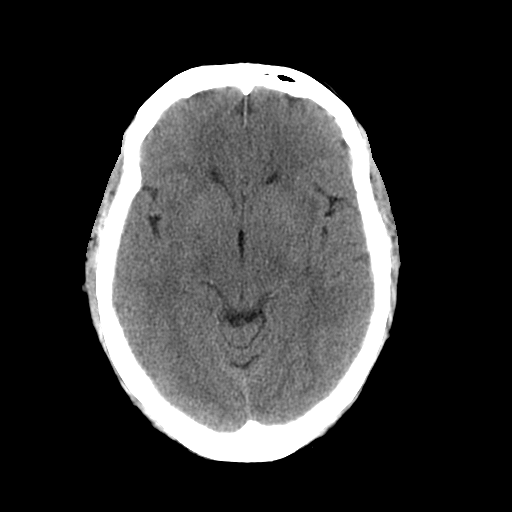
[im 12/32  bone]
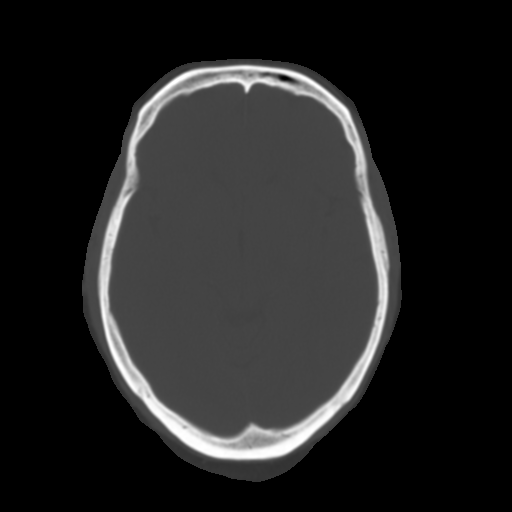
[im 14/32  brain]
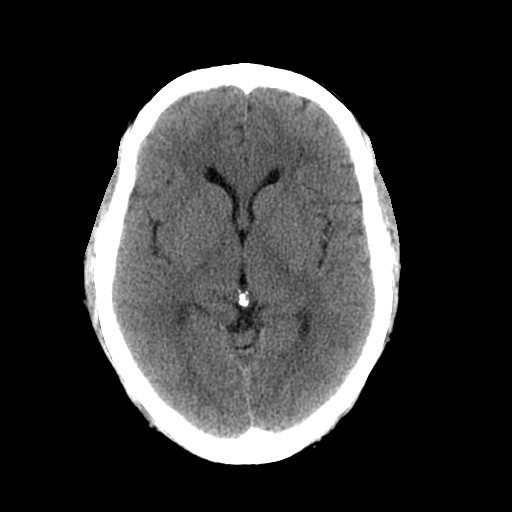
[im 16/32  brain]
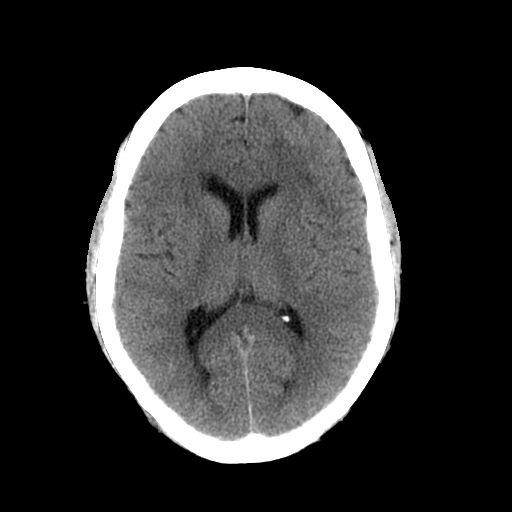
[im 18/32  brain]
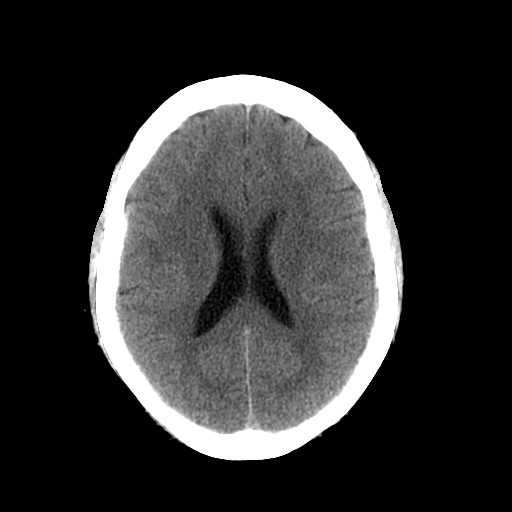
[im 20/32  brain]
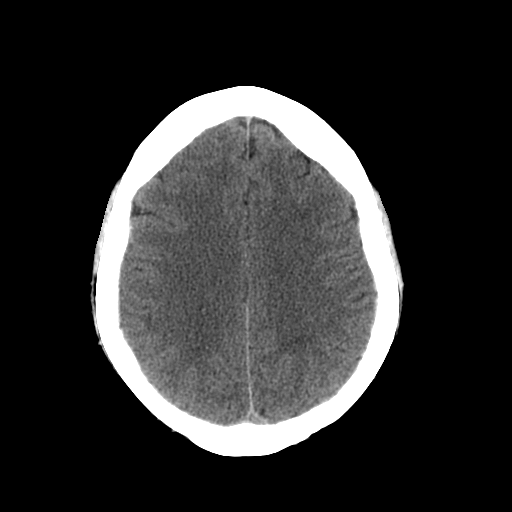
[im 20/32  bone]
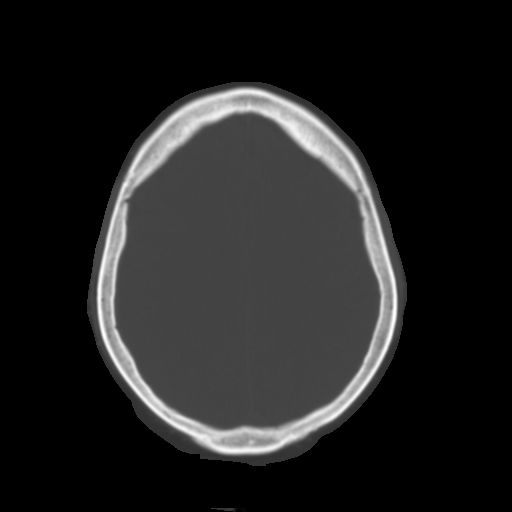
[im 23/32  brain]
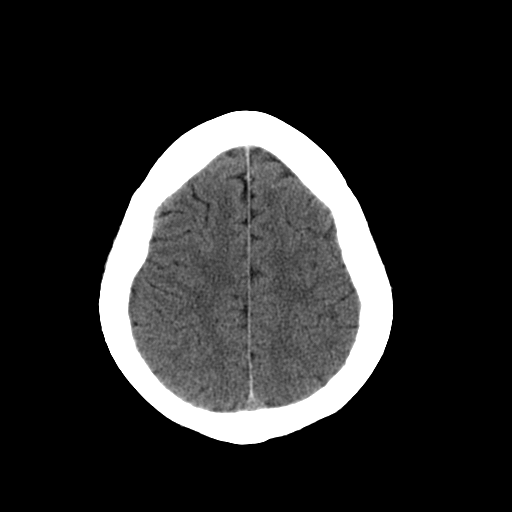
[im 25/32  brain]
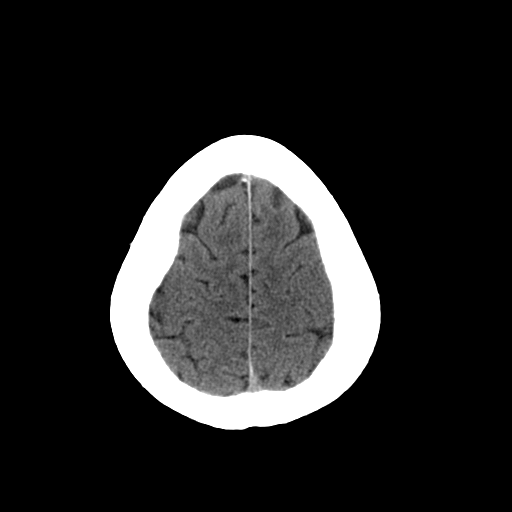
[im 27/32  brain]
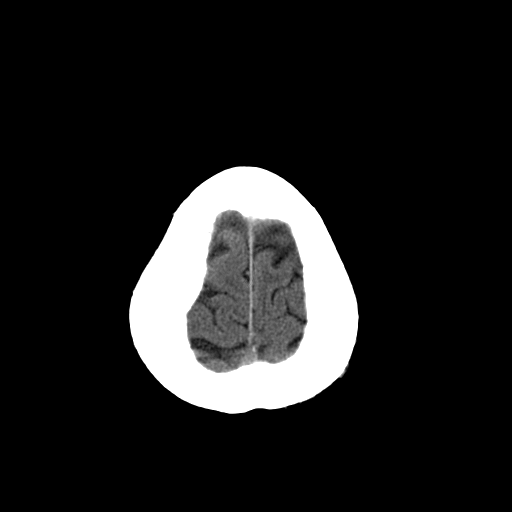
[im 29/32  brain]
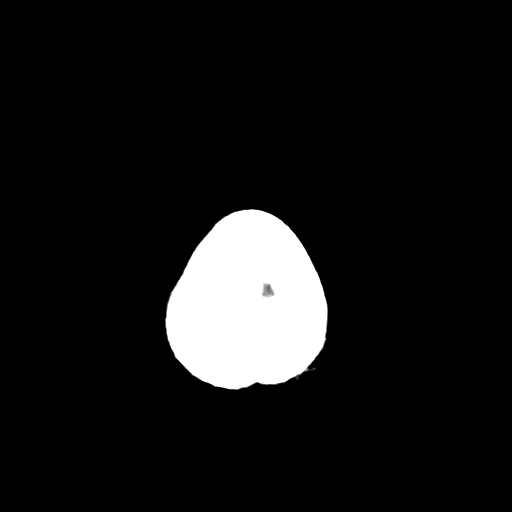
[im 29/32  bone]
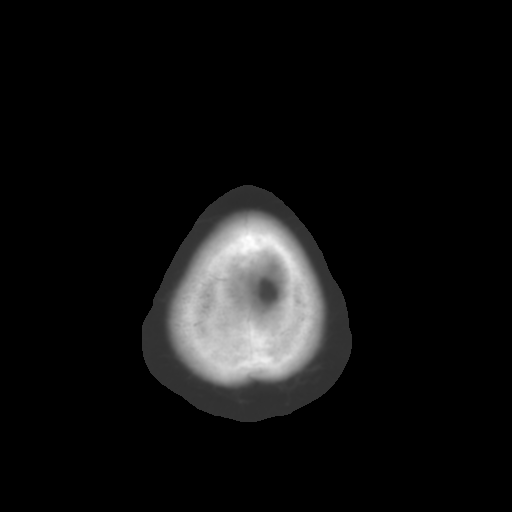

[13 of 30 positions shown; findings below may reference images not displayed]

There is no evidence of intracranial hemorrhage, brain edema, or mass effect.  No other intra-axial abnormalities are seen, and the ventricles are within normal limits.  No abnormal extra-axial fluid collections or masses are identified.  No skull abnormalities are noted.
IMPRESSION: Negative non-contrast head CT.

## 2005-05-12 ENCOUNTER — Emergency Department (HOSPITAL_COMMUNITY): Admission: EM | Admit: 2005-05-12 | Discharge: 2005-05-12 | Payer: Self-pay | Admitting: Emergency Medicine

## 2005-12-22 IMAGING — CR DG CHEST 1V PORT
1 series · 1 of 1 positions shown · non-contrast
Comparison: none

CLINICAL DATA: Chest pain and left arm pain.
 PORTABLE SEMIERECT CHEST ? 12/22/05 AT [DATE] P.M.:
 Comparison examination of 02/26/02.

[view not recorded]
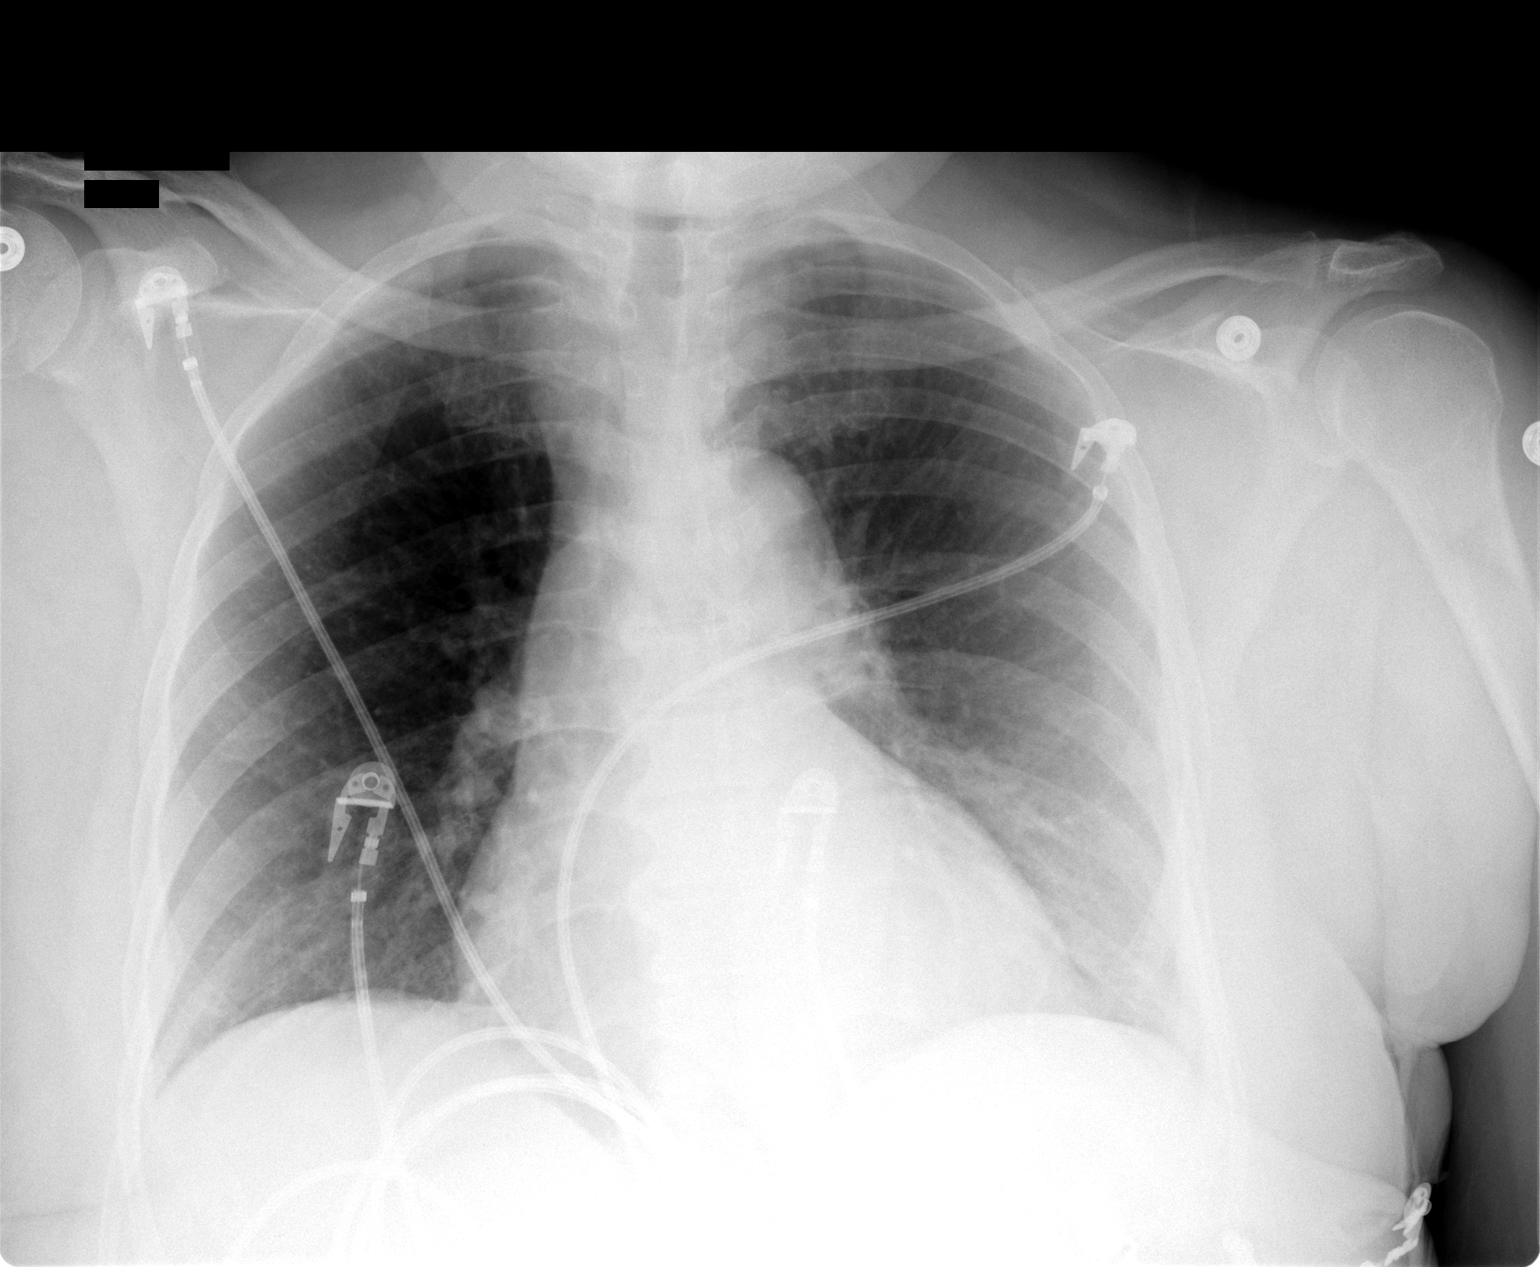

[1 of 1 positions shown; findings below may reference images not displayed]

FINDINGS: Cardiomegaly.  Central pulmonary vascular prominent.  Mildly tortuous aorta.  No segmental infiltrate or congestive heart failure.  Follow-up PA and lateral chest with better inspiration is recommended for further evaluation of underlying lung parenchyma.
IMPRESSION: 1.  Cardiomegaly.
 2.  Central pulmonary vascular prominence.
 3.    Tortuous aorta.  Follow-up as noted above.

## 2005-12-23 ENCOUNTER — Inpatient Hospital Stay (HOSPITAL_COMMUNITY): Admission: EM | Admit: 2005-12-23 | Discharge: 2005-12-24 | Payer: Self-pay | Admitting: Emergency Medicine

## 2005-12-23 IMAGING — CR DG HUMERUS 2V *L*
3 series · 3 of 3 positions shown · non-contrast
Comparison: none

CLINICAL DATA: Left arm and chest pain

LEFT HUMERUS - 2  VIEW:

[view not recorded (1 of 3)]
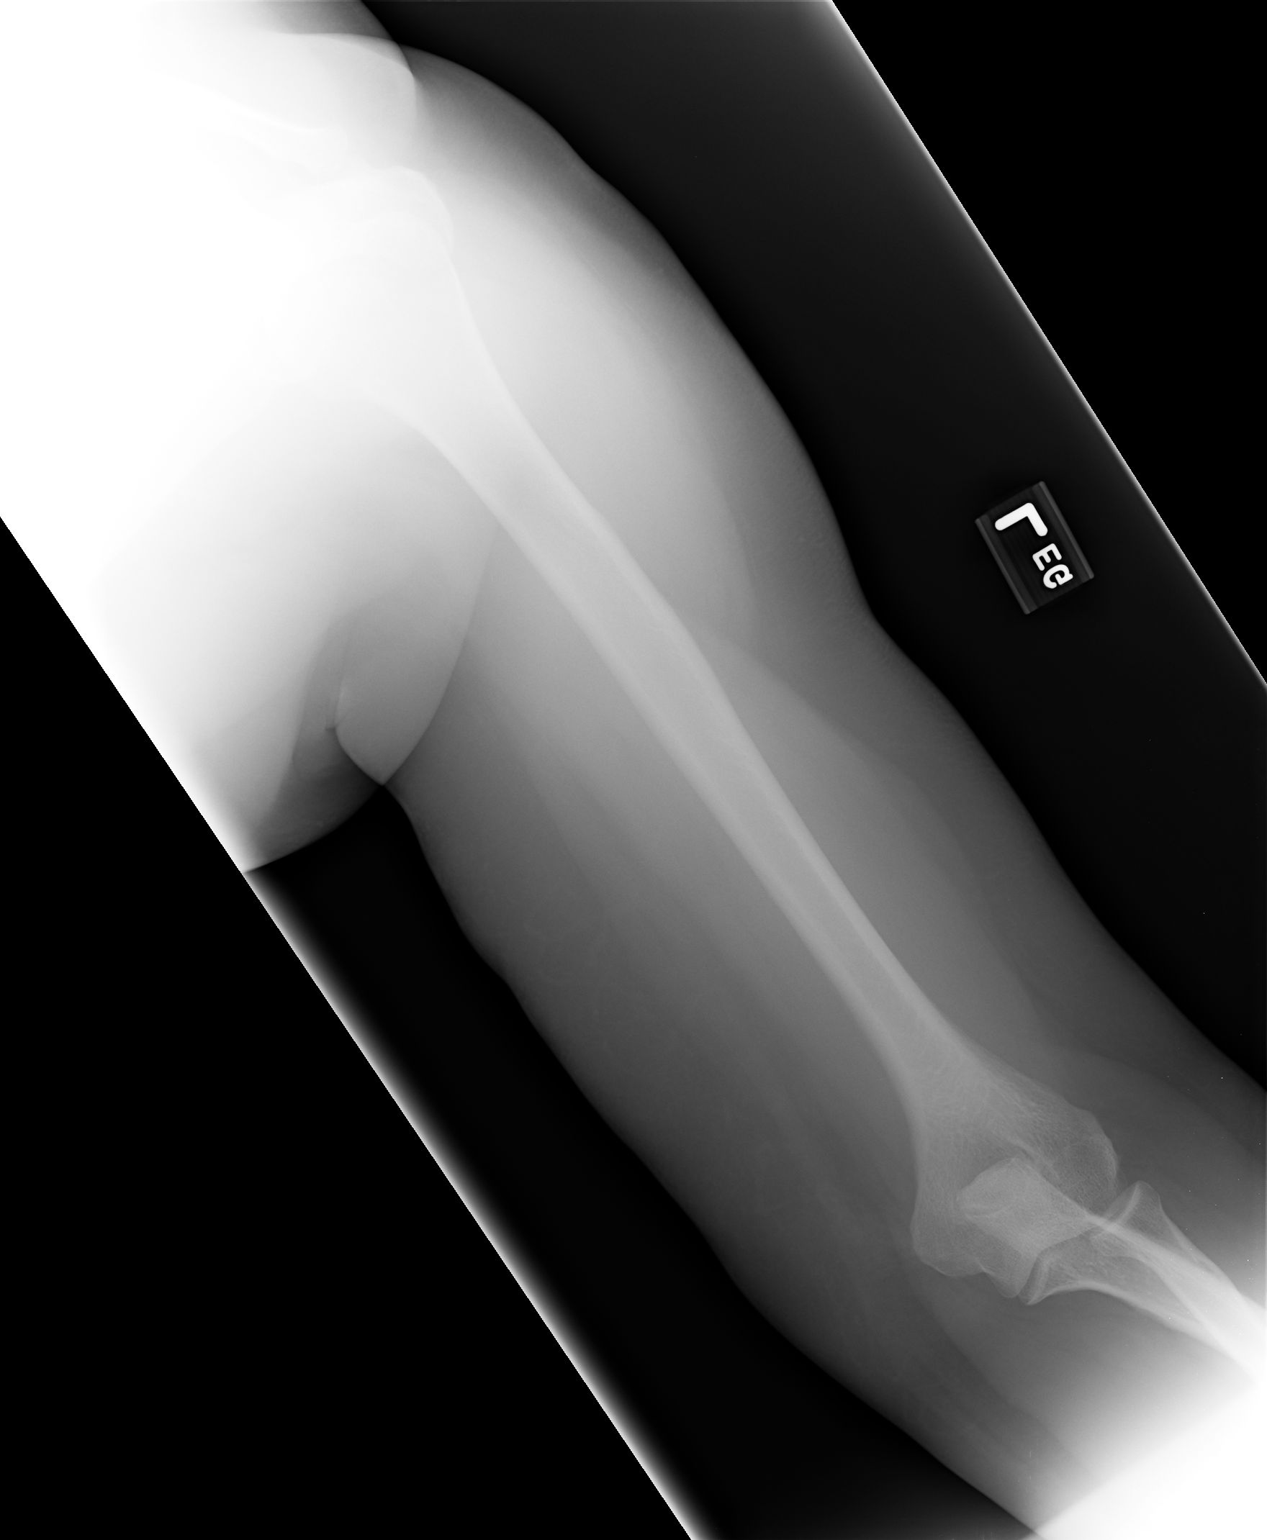

[view not recorded (2 of 3)]
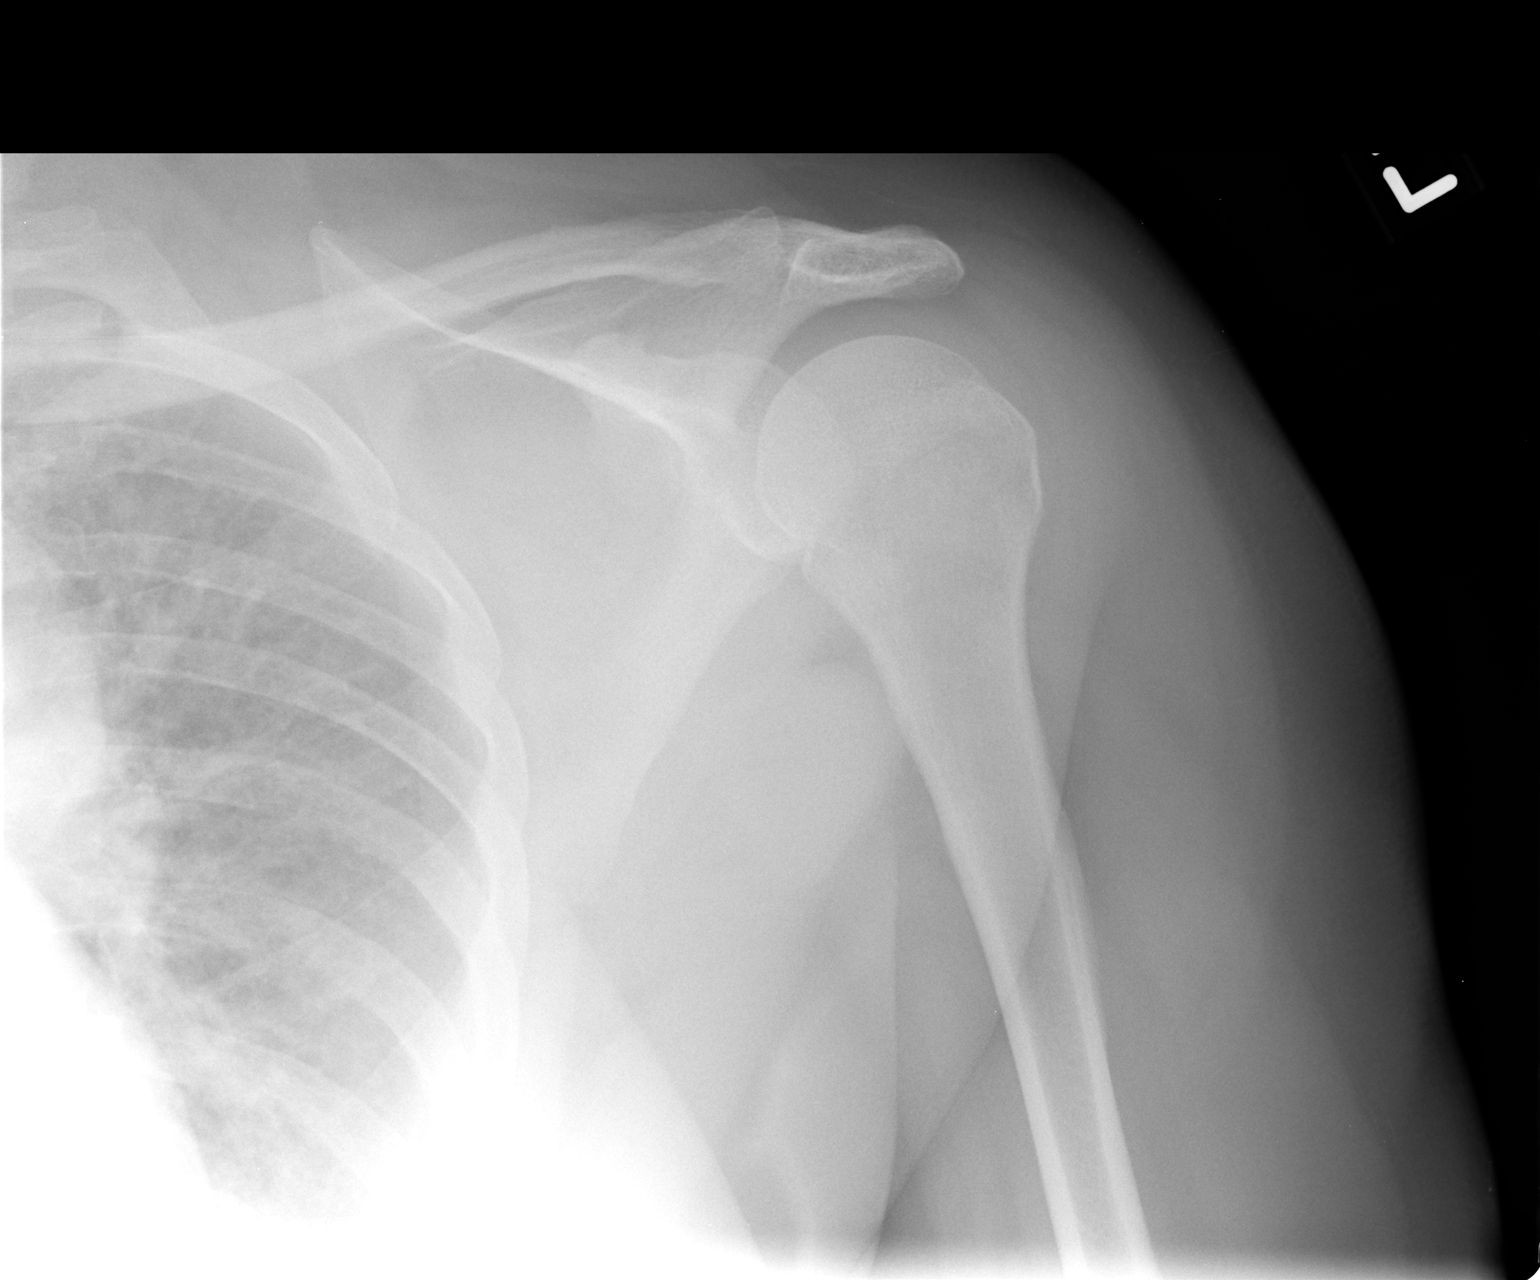

[view not recorded (3 of 3)]
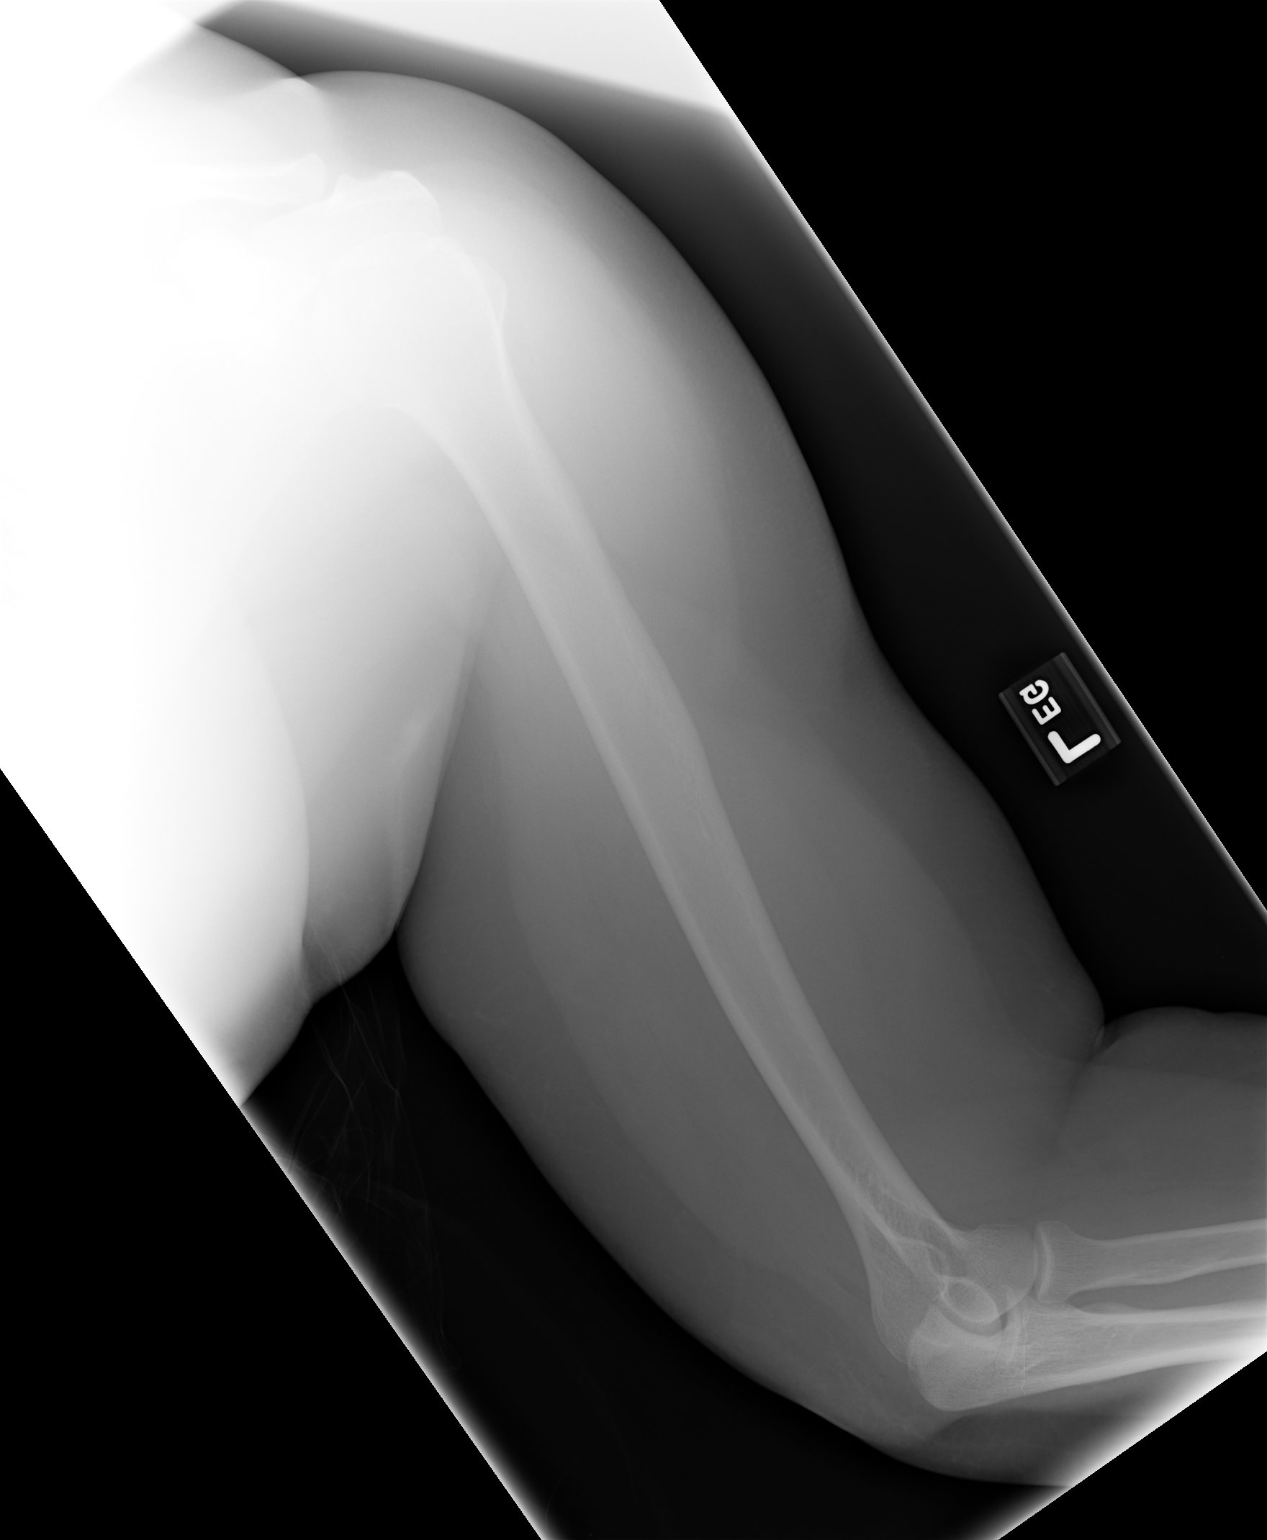

[3 of 3 positions shown; findings below may reference images not displayed]

FINDINGS: There is no evidence of fracture or other focal bone lesions.  Soft
tissues are unremarkable.
IMPRESSION: Negative.

## 2005-12-23 IMAGING — CT CT ANGIO CHEST
1 of 5 series · 14 of 30 positions shown · IV contrast (120 ML OMNI 300)
Comparison: None available.

CLINICAL DATA: Chest pain.  Dyspnea.  
 CT ANGIOGRAPHY OF CHEST:
TECHNIQUE: Multidetector CT imaging of the chest was performed during bolus injection of intravenous contrast.  Multiplanar CT angiographic image reconstructions were generated to evaluate the vascular anatomy.
 Contrast:  120 cc Omnipaque 300

[Series 3: pe · axial · 0.70mm/px · z∈[-327,-55]mm · 14 of 510 slices shown]
[im 37/510  lung]
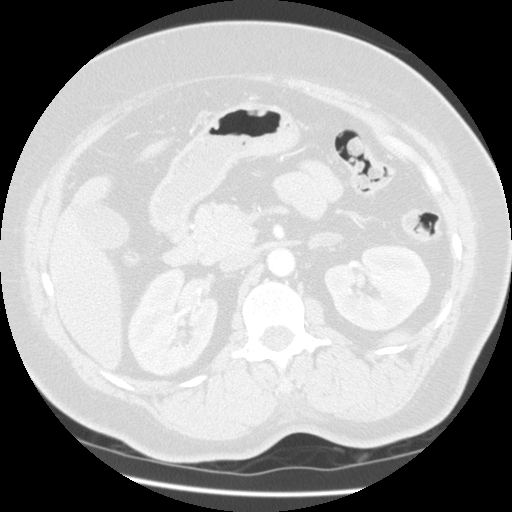
[im 73/510  mediastinal]
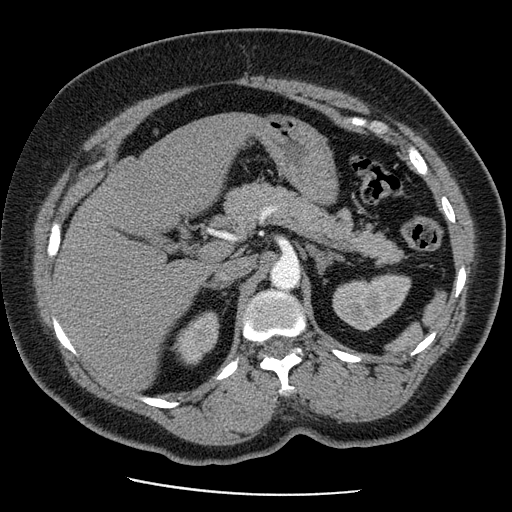
[im 110/510  lung]
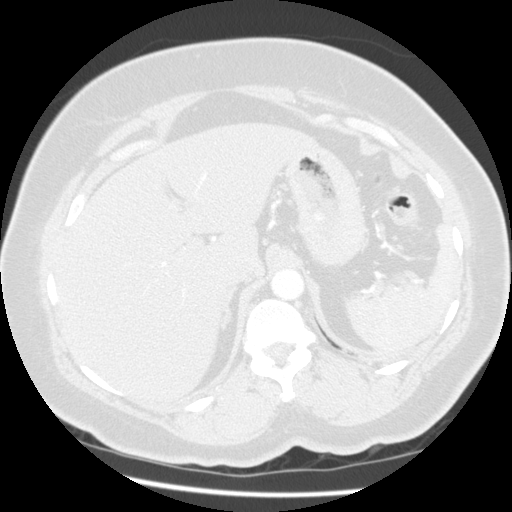
[im 146/510  mediastinal]
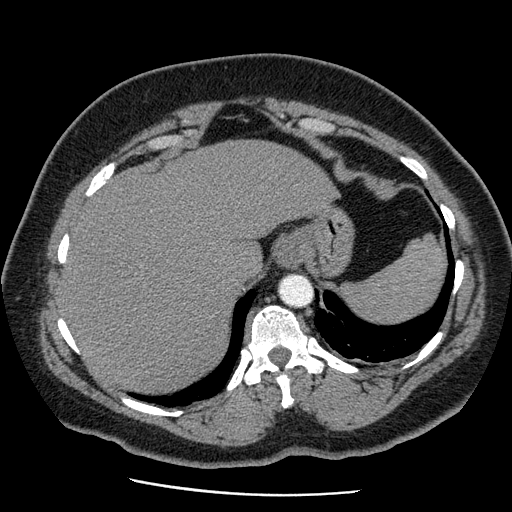
[im 182/510  lung]
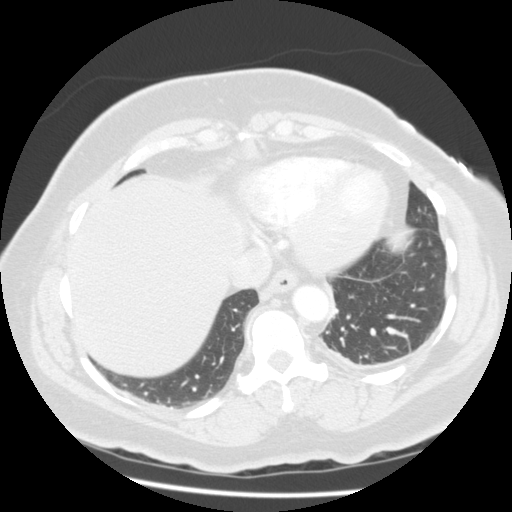
[im 219/510  mediastinal]
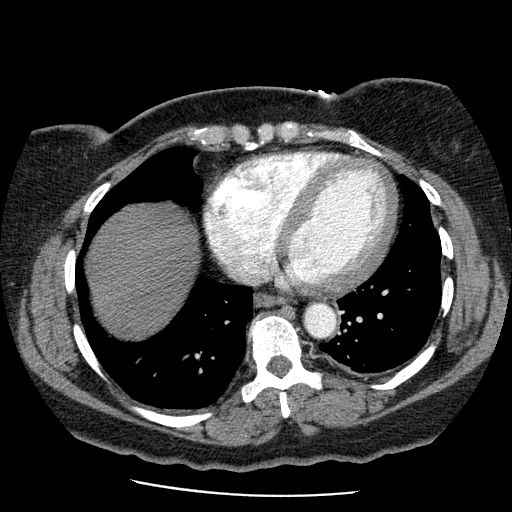
[im 240/510  lung]
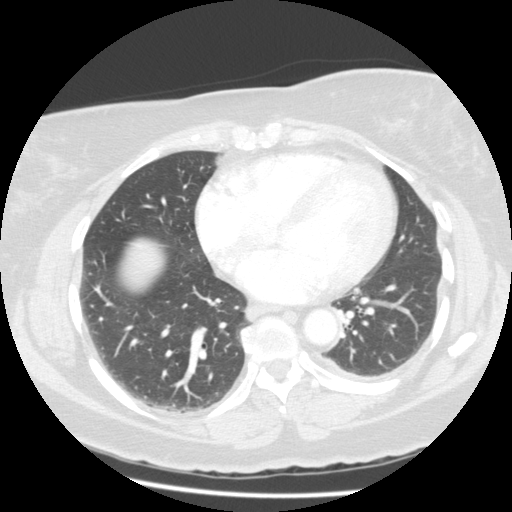
[im 255/510  mediastinal]
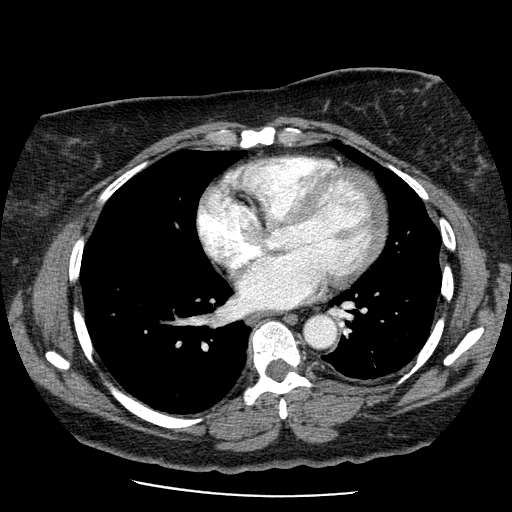
[im 291/510  lung]
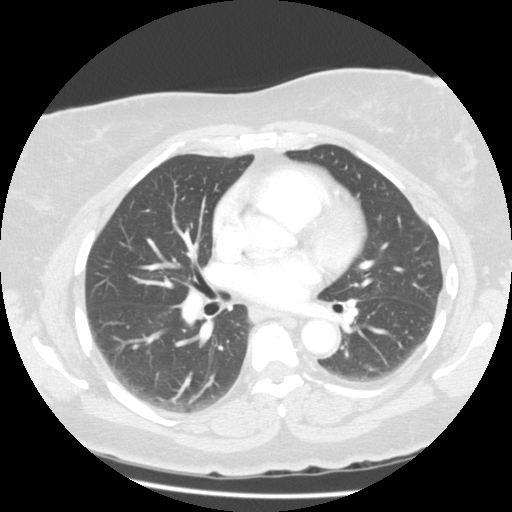
[im 328/510  mediastinal]
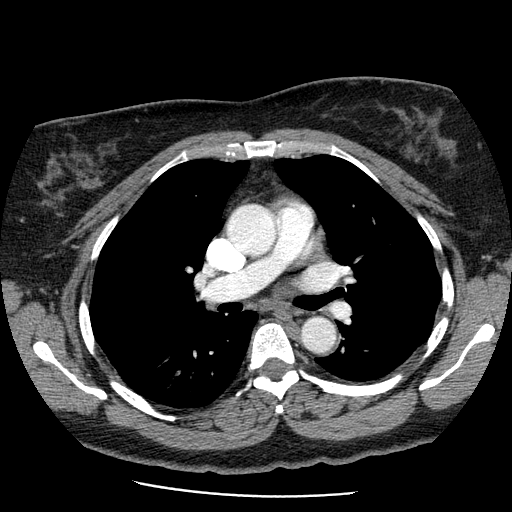
[im 364/510  lung]
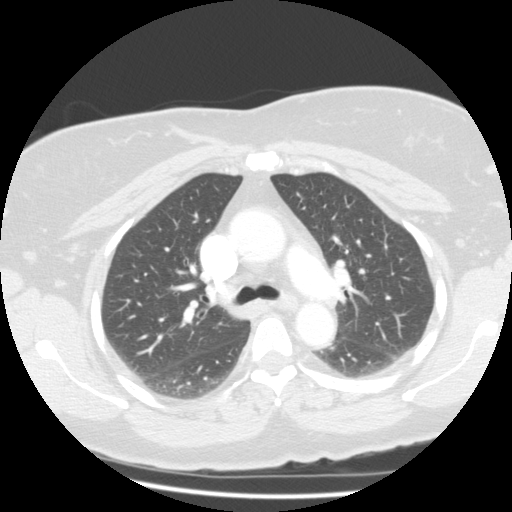
[im 400/510  mediastinal]
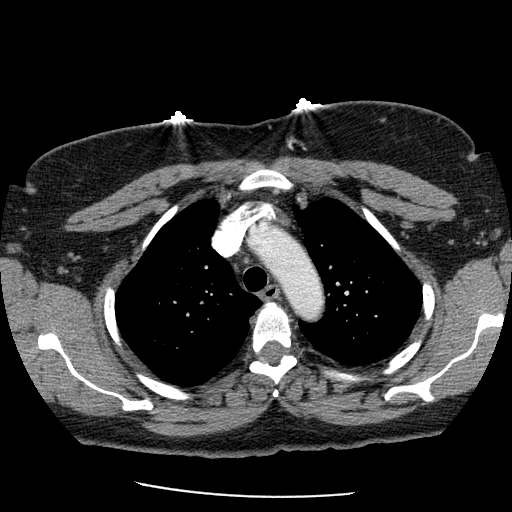
[im 437/510  lung]
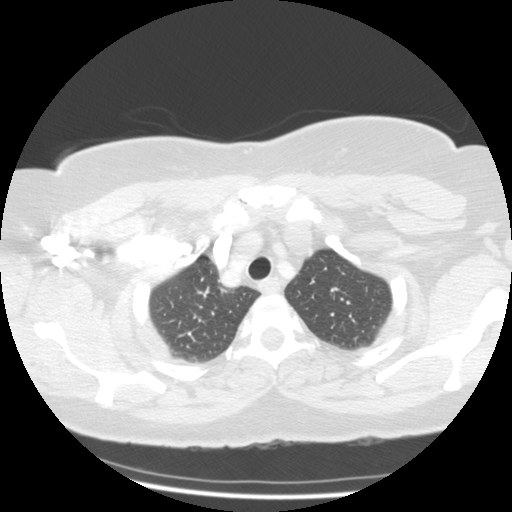
[im 473/510  mediastinal]
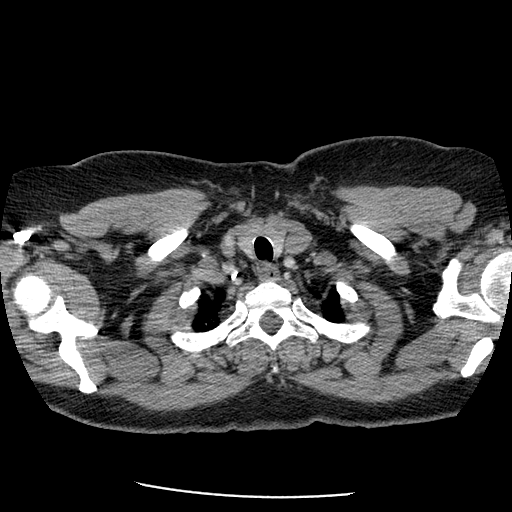

[14 of 30 positions shown; findings below may reference images not displayed]

FINDINGS: This study is technically adequate for the evaluation of pulmonary emboli.  
 No filling defects are identified in the pulmonary arterial system to suggest pulmonary emboli.  Mild cardiomegaly is identified.  There is no evidence of aortic dissection or aneurysm.  No enlarged lymph nodes in the axillary, mediastinal, or hilar regions are present.  No pleural or pericardial effusions are identified.  Minimal left basilar atelectasis is identified.  Mild thoracic spondylosis and degenerative disc disease identified.
IMPRESSION: 1.  No acute abnormality.  Specifically, no evidence of aortic dissection, aneurysm, or pulmonary emboli.  
 2.  Cardiomegaly.

## 2006-01-20 ENCOUNTER — Ambulatory Visit (HOSPITAL_COMMUNITY): Admission: RE | Admit: 2006-01-20 | Discharge: 2006-01-20 | Payer: Self-pay | Admitting: Internal Medicine

## 2006-03-02 ENCOUNTER — Encounter: Payer: Self-pay | Admitting: Emergency Medicine

## 2006-08-24 ENCOUNTER — Emergency Department (HOSPITAL_COMMUNITY): Admission: EM | Admit: 2006-08-24 | Discharge: 2006-08-24 | Payer: Self-pay | Admitting: Emergency Medicine

## 2006-10-15 ENCOUNTER — Emergency Department (HOSPITAL_COMMUNITY): Admission: EM | Admit: 2006-10-15 | Discharge: 2006-10-15 | Payer: Self-pay | Admitting: Emergency Medicine

## 2006-10-15 IMAGING — US US ABDOMEN COMPLETE
1 series · 14 of 25 positions shown · non-contrast
Comparison: 11/18/03.

CLINICAL DATA: Abdominal pain. 
ABDOMEN ULTRASOUND COMPLETE ? 10/15/06:
TECHNIQUE: Complete abdominal ultrasound examination was performed including evaluation of the liver, gallbladder, bile ducts, pancreas, kidneys, spleen, IVC, and abdominal aorta.

[Series 1: unknown · 0.32mm/px · 14 of 59 slices shown]
[im 1/59]
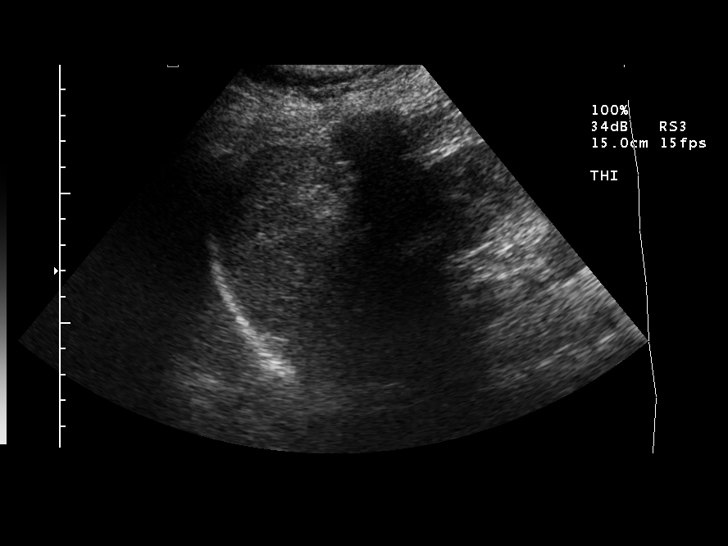
[im 5/59]
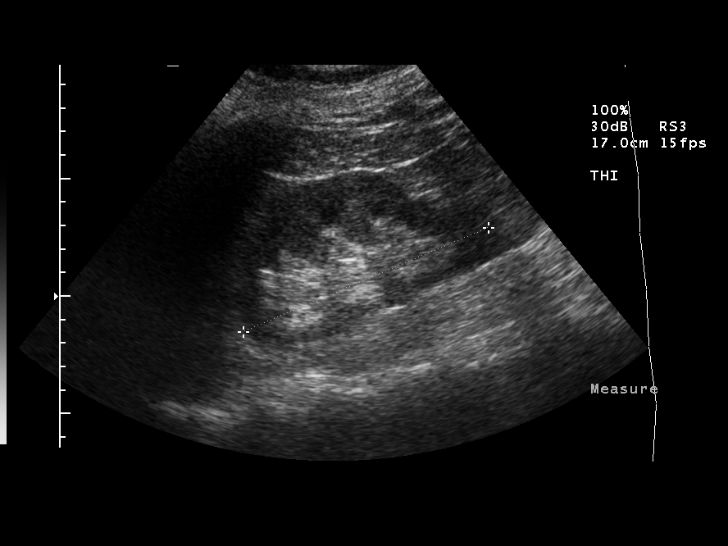
[im 10/59]
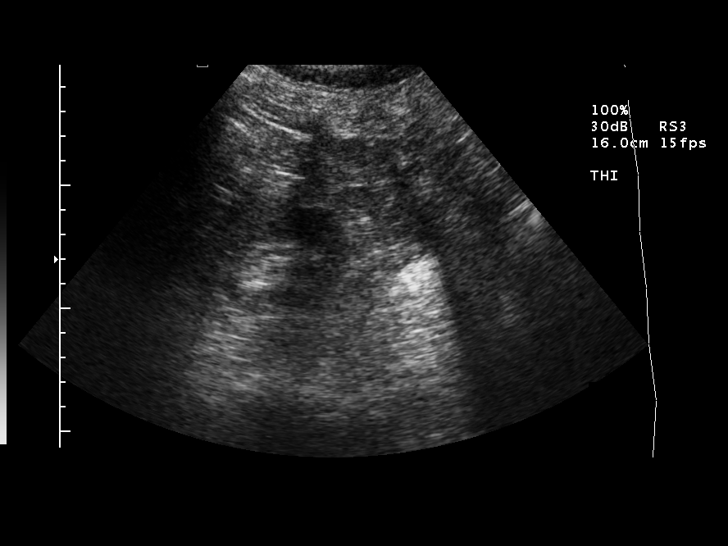
[im 15/59]
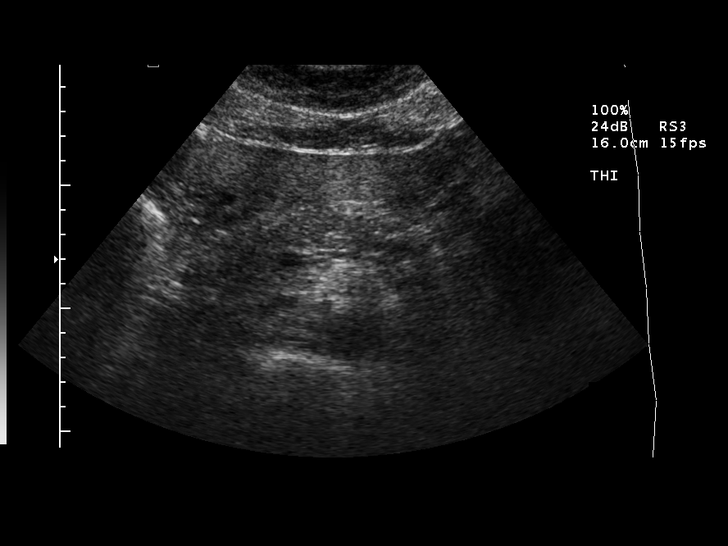
[im 20/59]
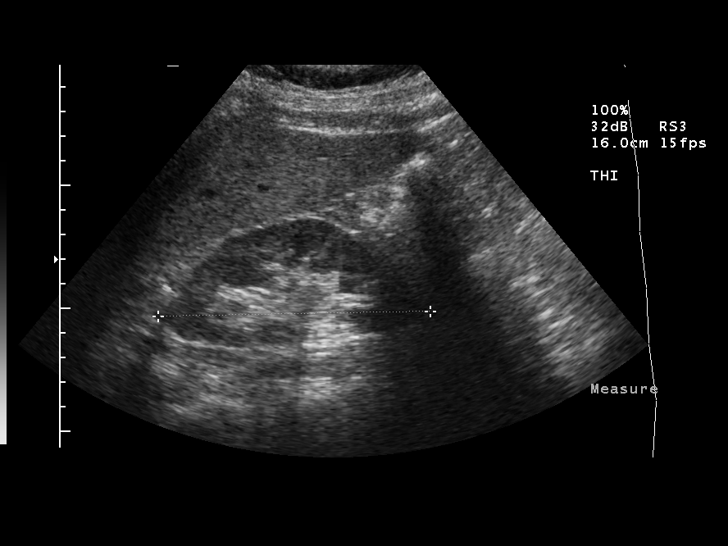
[im 22/59]
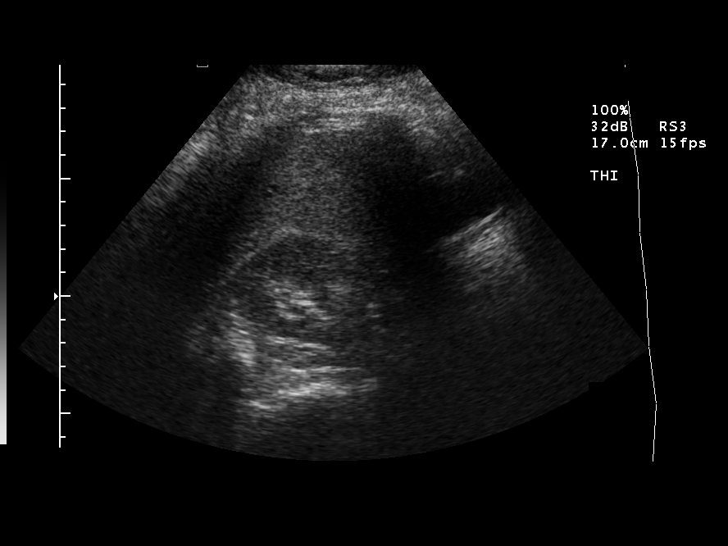
[im 27/59]
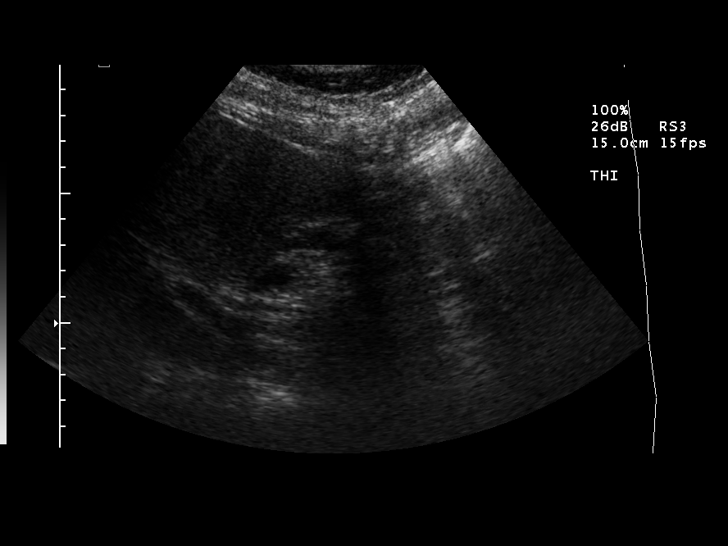
[im 32/59]
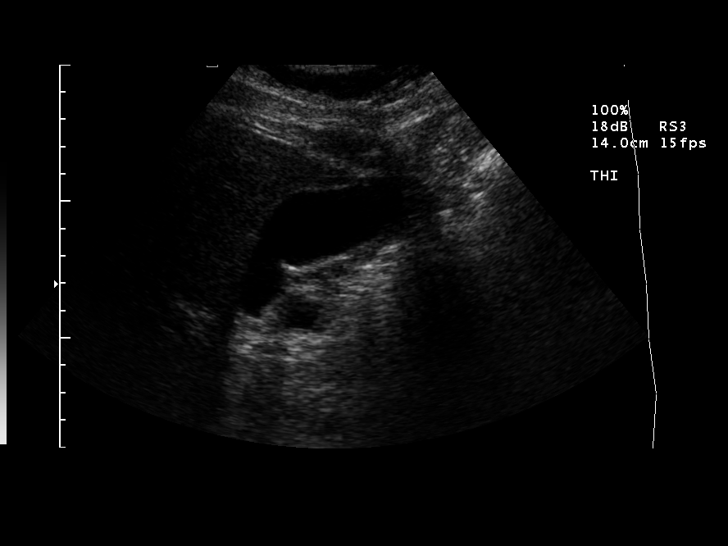
[im 37/59]
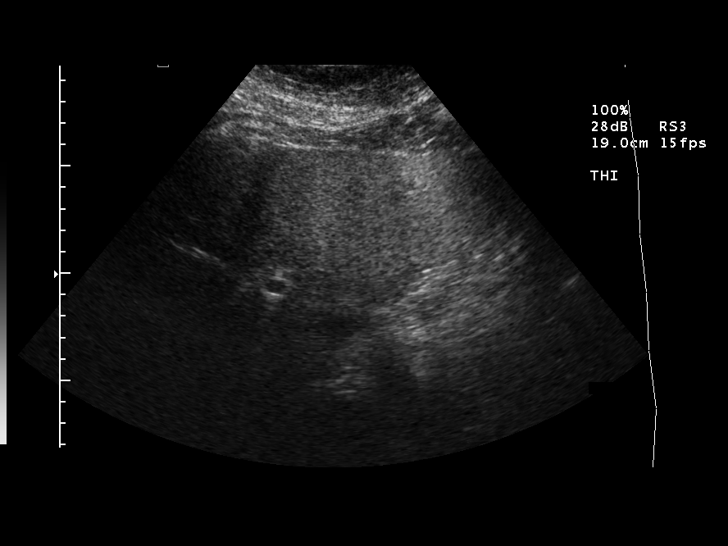
[im 39/59]
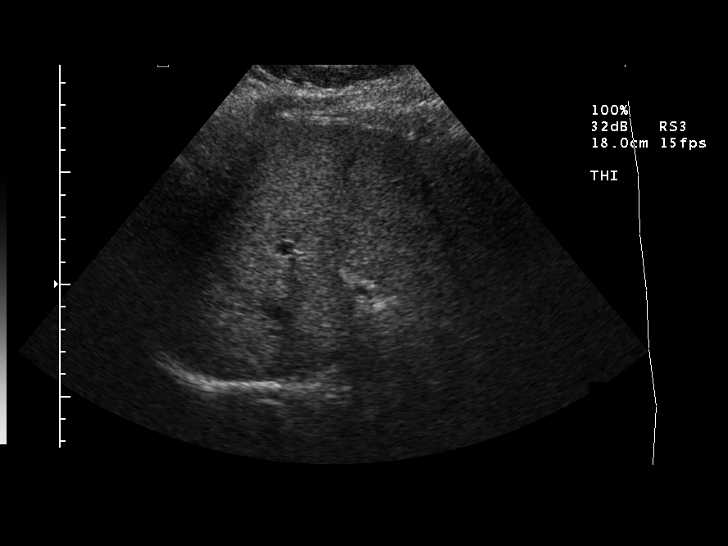
[im 44/59]
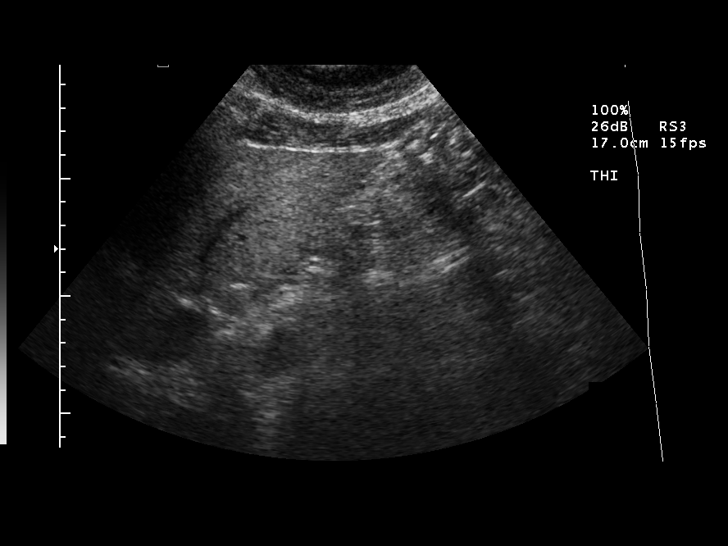
[im 49/59]
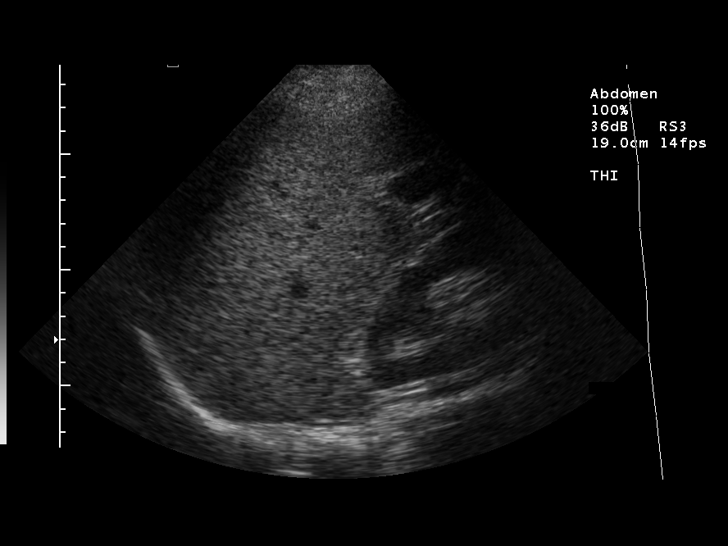
[im 54/59]
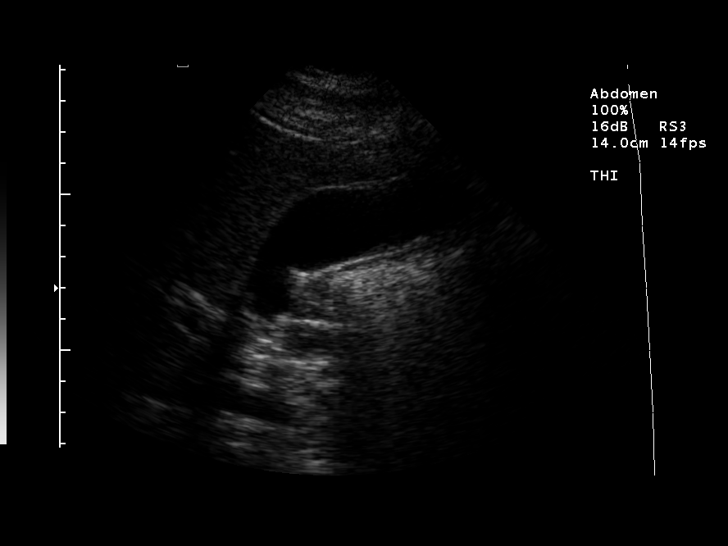
[im 59/59]
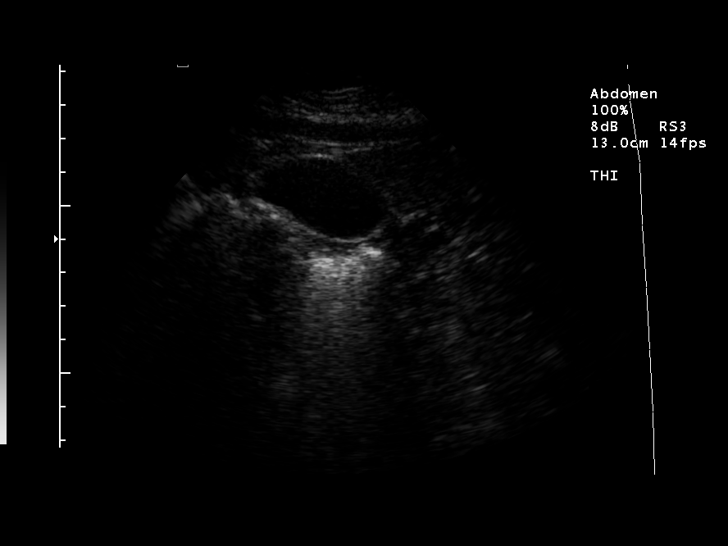

[14 of 25 positions shown; findings below may reference images not displayed]

FINDINGS: Mild echogenicity of the liver is compatible with fatty infiltration.  The remainder of the liver is unremarkable.  The gallbladder, CBD, inferior vena cava, pancreas, spleen, kidneys, and abdominal aorta are unremarkable.  There is no evidence of free fluid.
IMPRESSION: Fatty infiltration of the liver ? otherwise unremarkable abdominal ultrasound.

## 2008-03-09 ENCOUNTER — Emergency Department (HOSPITAL_COMMUNITY): Admission: EM | Admit: 2008-03-09 | Discharge: 2008-03-09 | Payer: Self-pay | Admitting: Emergency Medicine

## 2008-03-09 IMAGING — CT CT PELVIS W/ CM
1 of 3 series · 15 of 32 positions shown, 19 images · IV contrast (APPLIED)
Comparison: Ultrasound 10/15/2006

CT ABDOMEN

CLINICAL DATA: Hypertension.  Diabetes.  Abdominal pain acute
onset

CT ABDOMEN AND PELVIS WITH CONTRAST
TECHNIQUE: Multidetector CT imaging of the abdomen and pelvis was
performed using the standard protocol following bolus
administration of intravenous contrast.
Contrast: 100 ml [id number]

[Series 2: abd/pelv with 5.0 b31f st · axial · 0.71mm/px · z∈[-442,-17]mm · 15 of 95 slices shown, 19 images]
[im 5/95  soft-tissue]
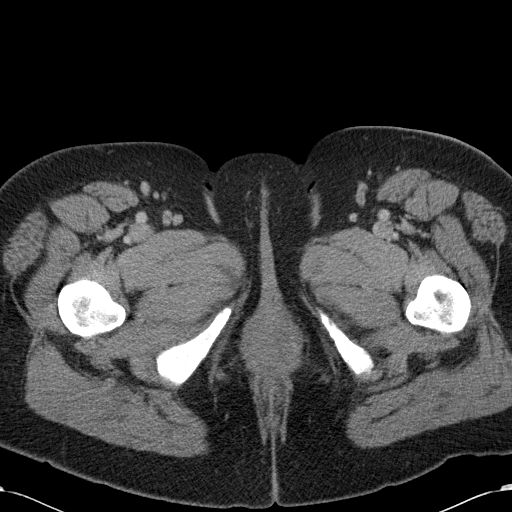
[im 5/95  bone]
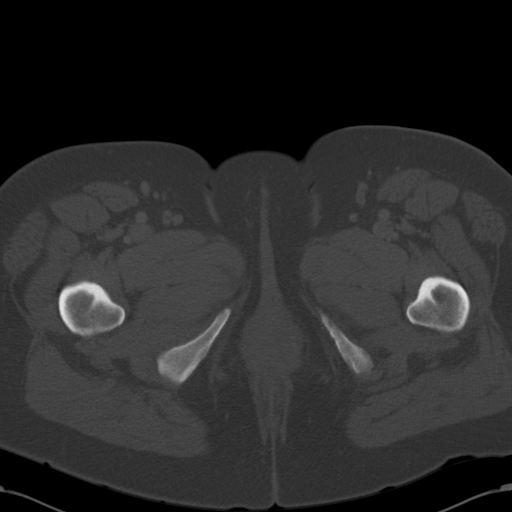
[im 13/95  soft-tissue]
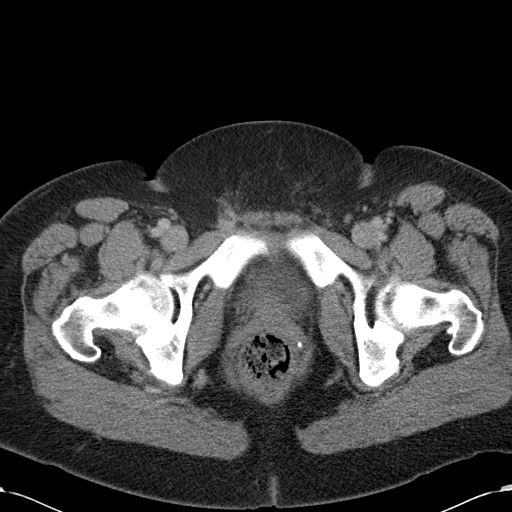
[im 22/95  soft-tissue]
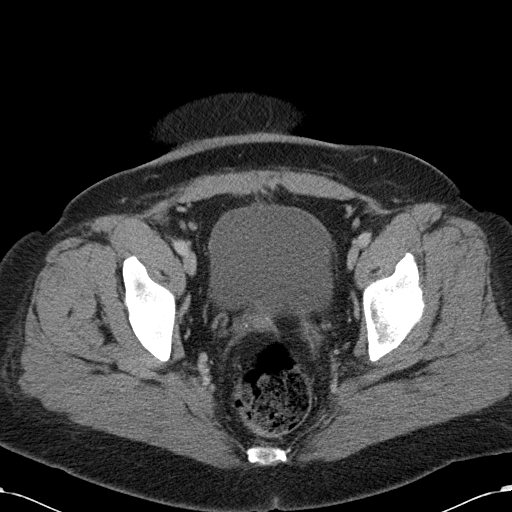
[im 26/95  soft-tissue]
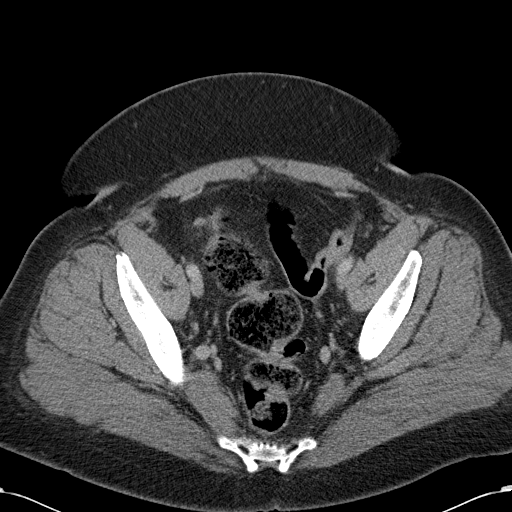
[im 35/95  soft-tissue]
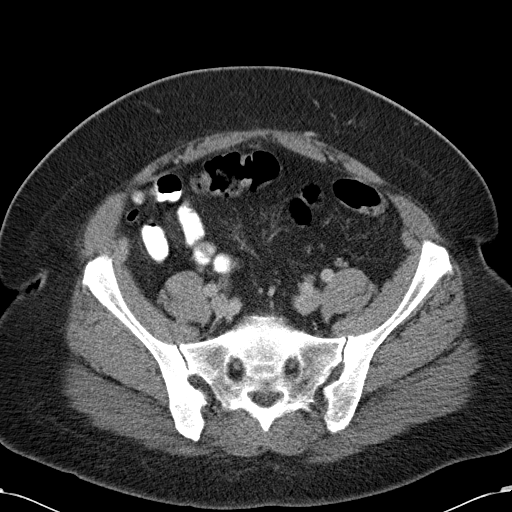
[im 39/95  soft-tissue]
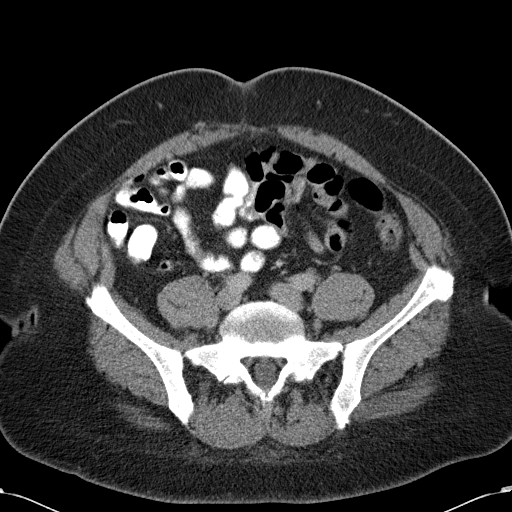
[im 48/95  soft-tissue]
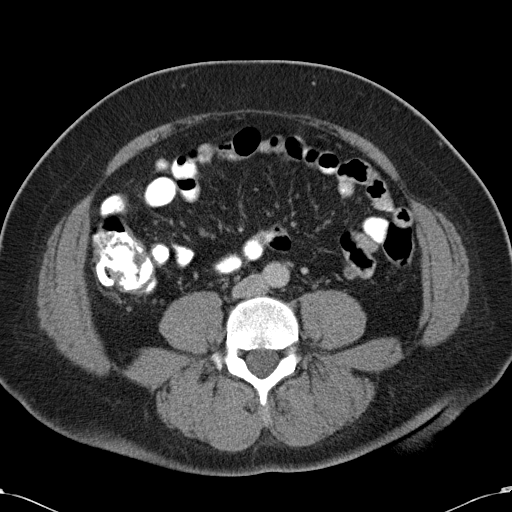
[im 56/95  soft-tissue]
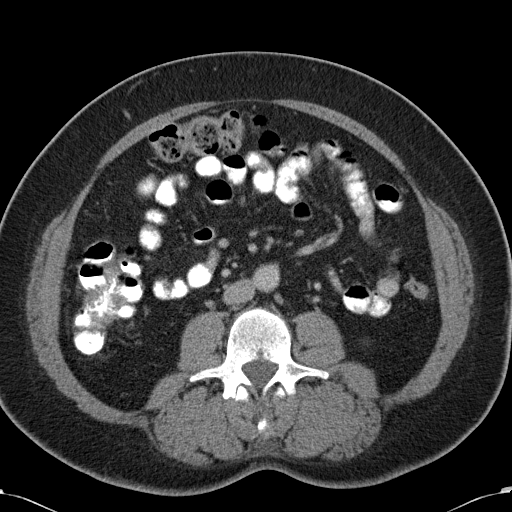
[im 60/95  soft-tissue]
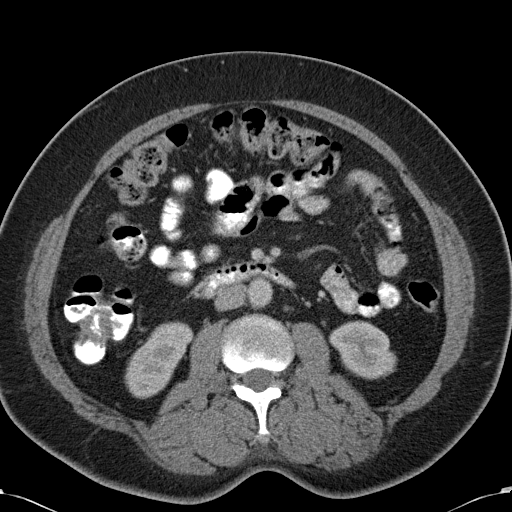
[im 60/95  bone]
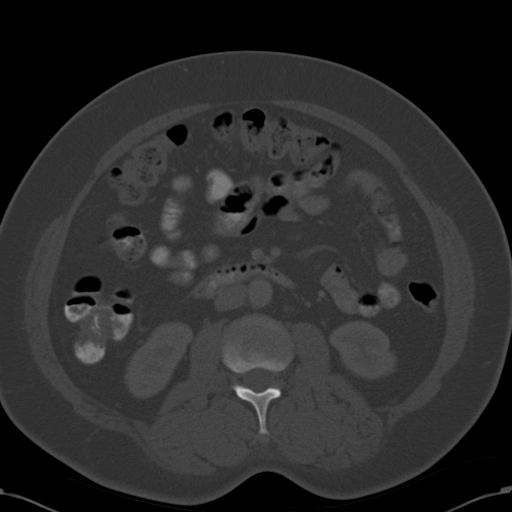
[im 69/95  soft-tissue]
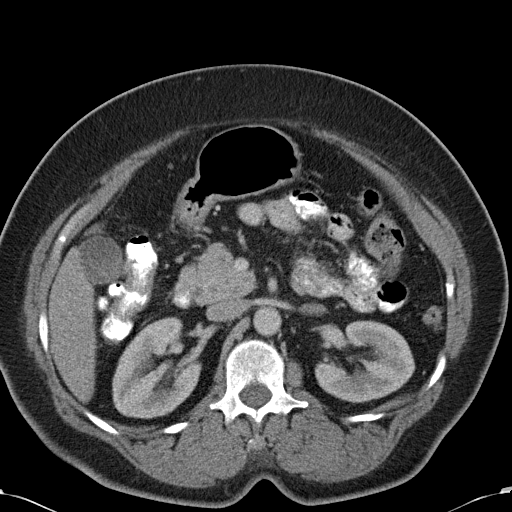
[im 73/95  soft-tissue]
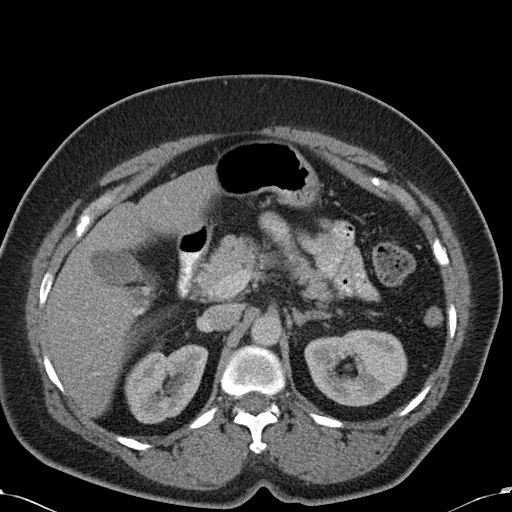
[im 77/95  lung]
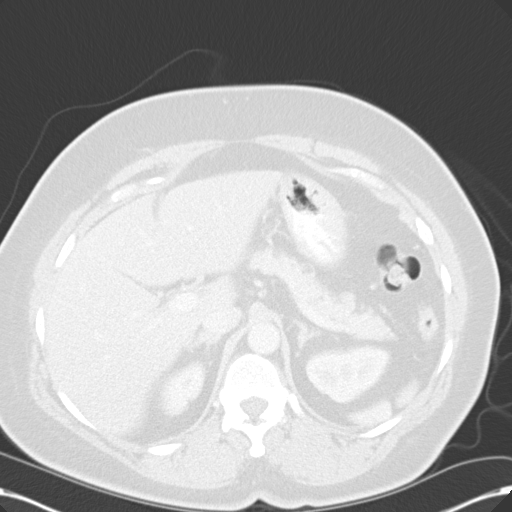
[im 82/95  soft-tissue]
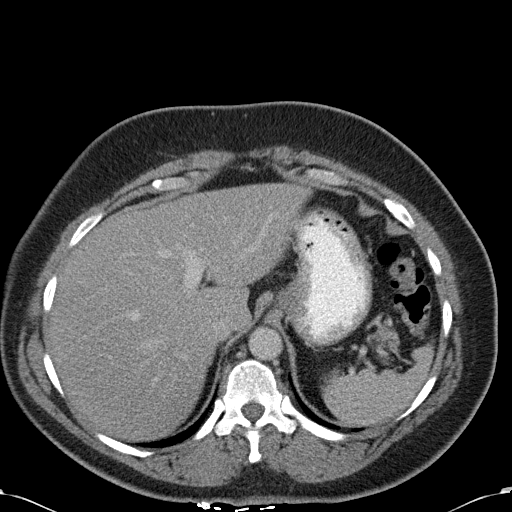
[im 82/95  lung]
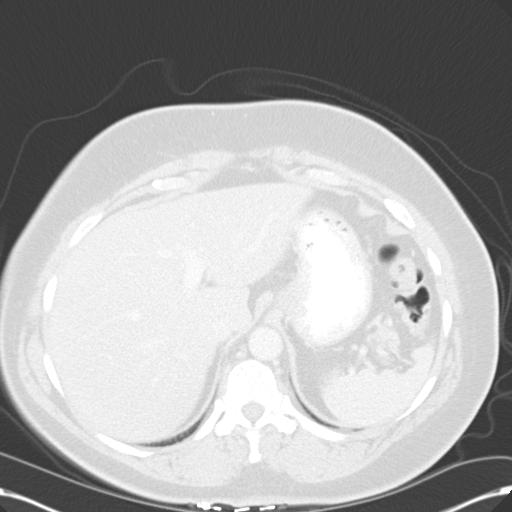
[im 86/95  lung]
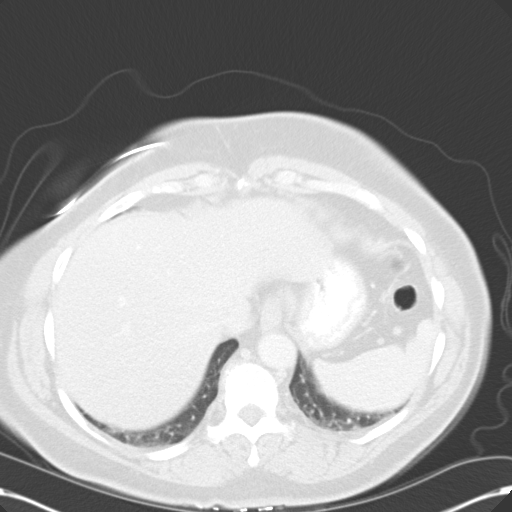
[im 90/95  soft-tissue]
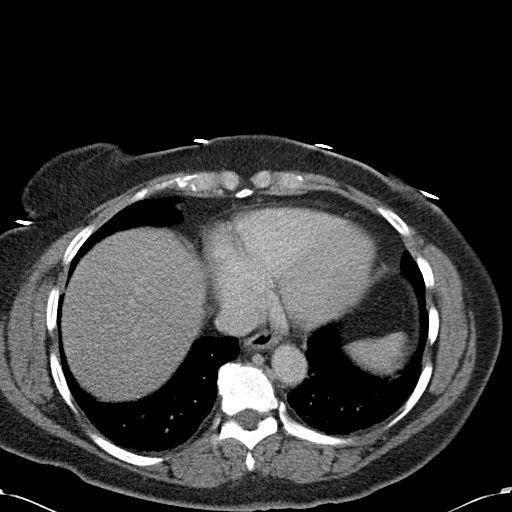
[im 90/95  lung]
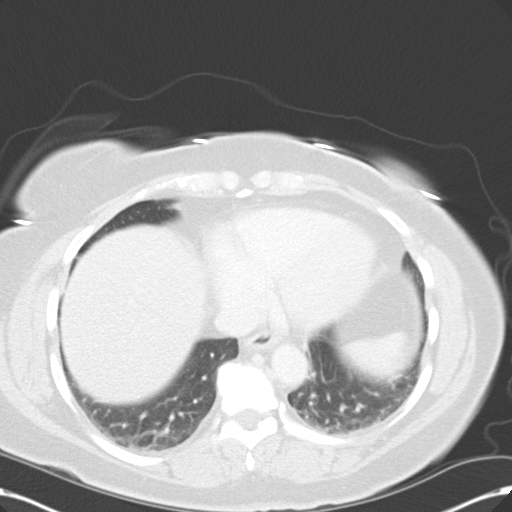

[15 of 32 positions shown; findings below may reference images not displayed]

FINDINGS: Lung bases are clear.  No pleural or pericardial fluid.
The liver shows fatty change without a focal lesion or biliary
ductal dilatation.  No calcified gallstones.  The spleen is normal.
The pancreas is normal.  The adrenal glands are normal.  The
kidneys are normal.  The aorta and IVC are normal.  No
retroperitoneal mass or adenopathy.  No free intraperitoneal fluid
or air.  No bowel pathology seen in the abdominal portion of the
scan.
IMPRESSION: Negative except for mild fatty change of the liver.  No cause of
acute pain is identified.

CT PELVIS
FINDINGS: The appendix appears normal.  The patient has a large
amount of fecal matter in the rectum and sigmoid colon.  This could
be a source of pain.  There is no sign of diverticulitis however.
There has been previous hysterectomy.  No sign of mass or
adenopathy.
IMPRESSION: Fairly large amount of fecal matter in the rectosigmoid region.
This could be a cause of pain.  No other abnormality is evident.

## 2008-09-26 ENCOUNTER — Emergency Department (HOSPITAL_COMMUNITY): Admission: EM | Admit: 2008-09-26 | Discharge: 2008-09-26 | Payer: Self-pay | Admitting: Family Medicine

## 2008-09-26 IMAGING — CR DG CHEST 2V
2 series · 2 of 2 positions shown · non-contrast
Comparison: 12/22/2005

CLINICAL DATA: Cough

CHEST - 2 VIEW

[view not recorded (1 of 2)]
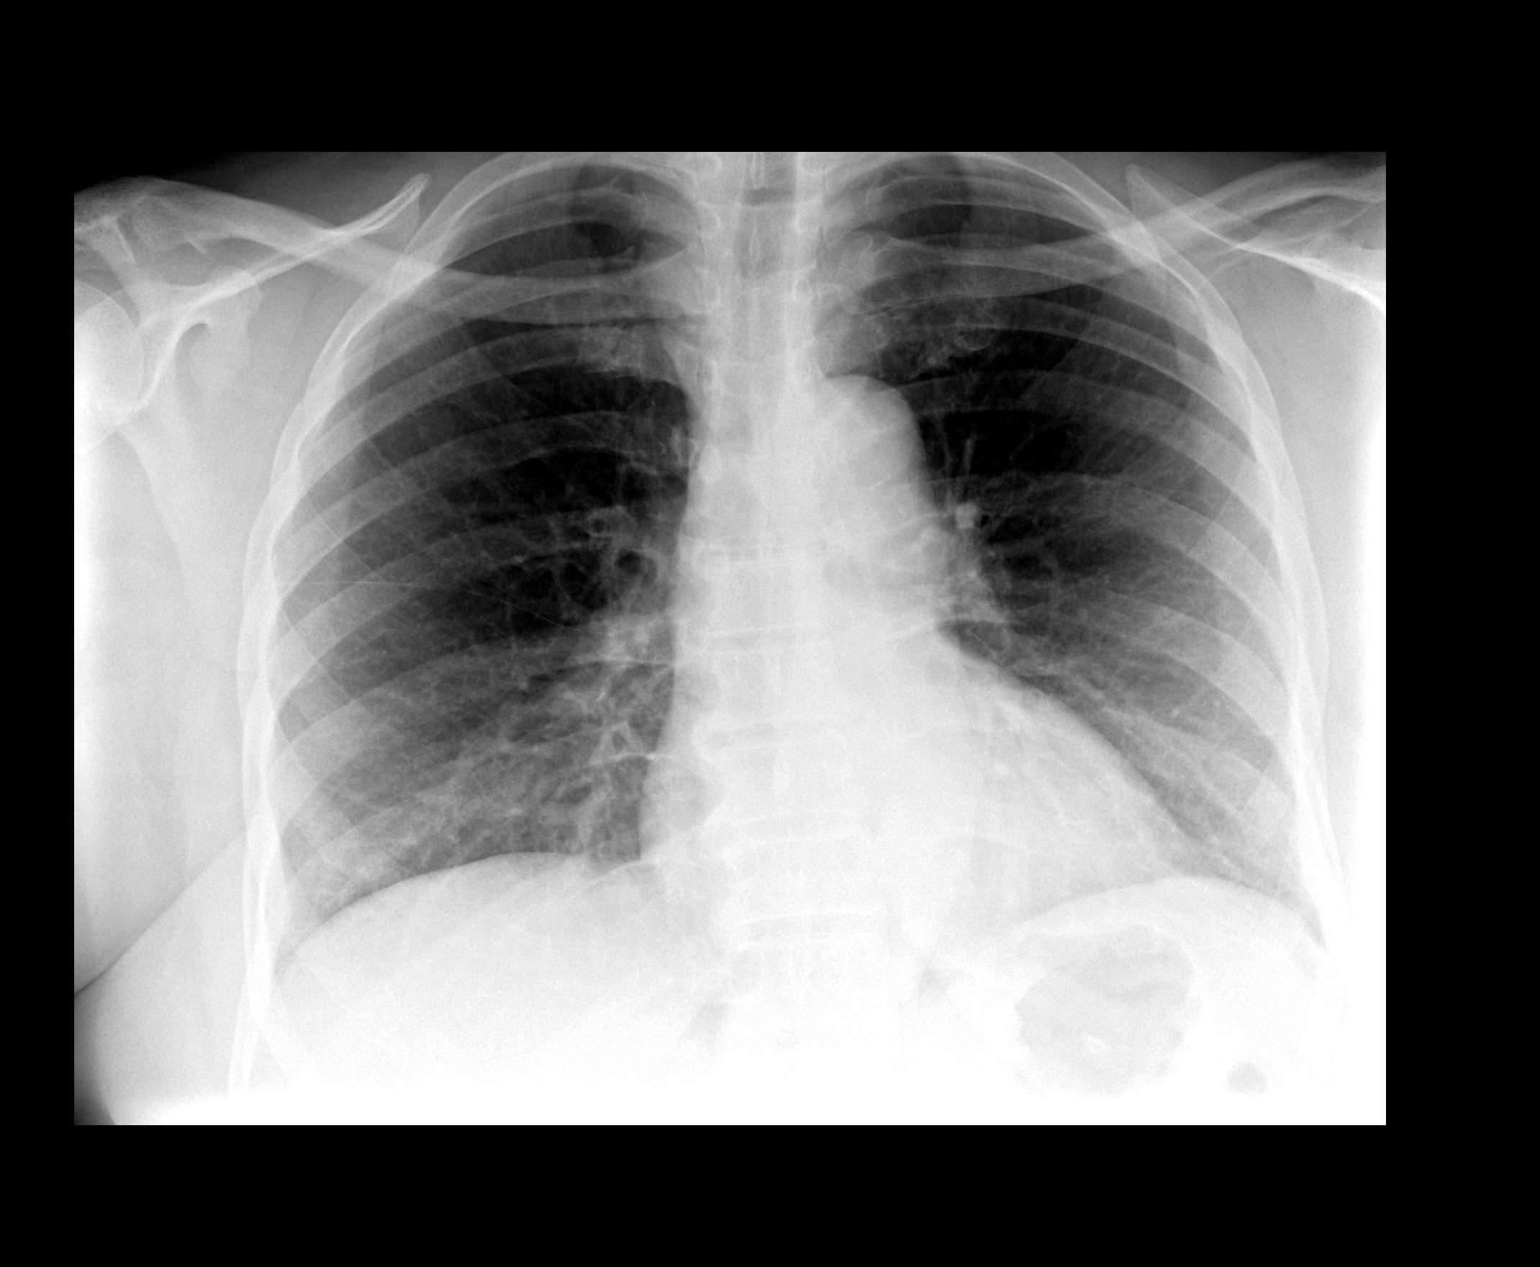

[view not recorded (2 of 2)]
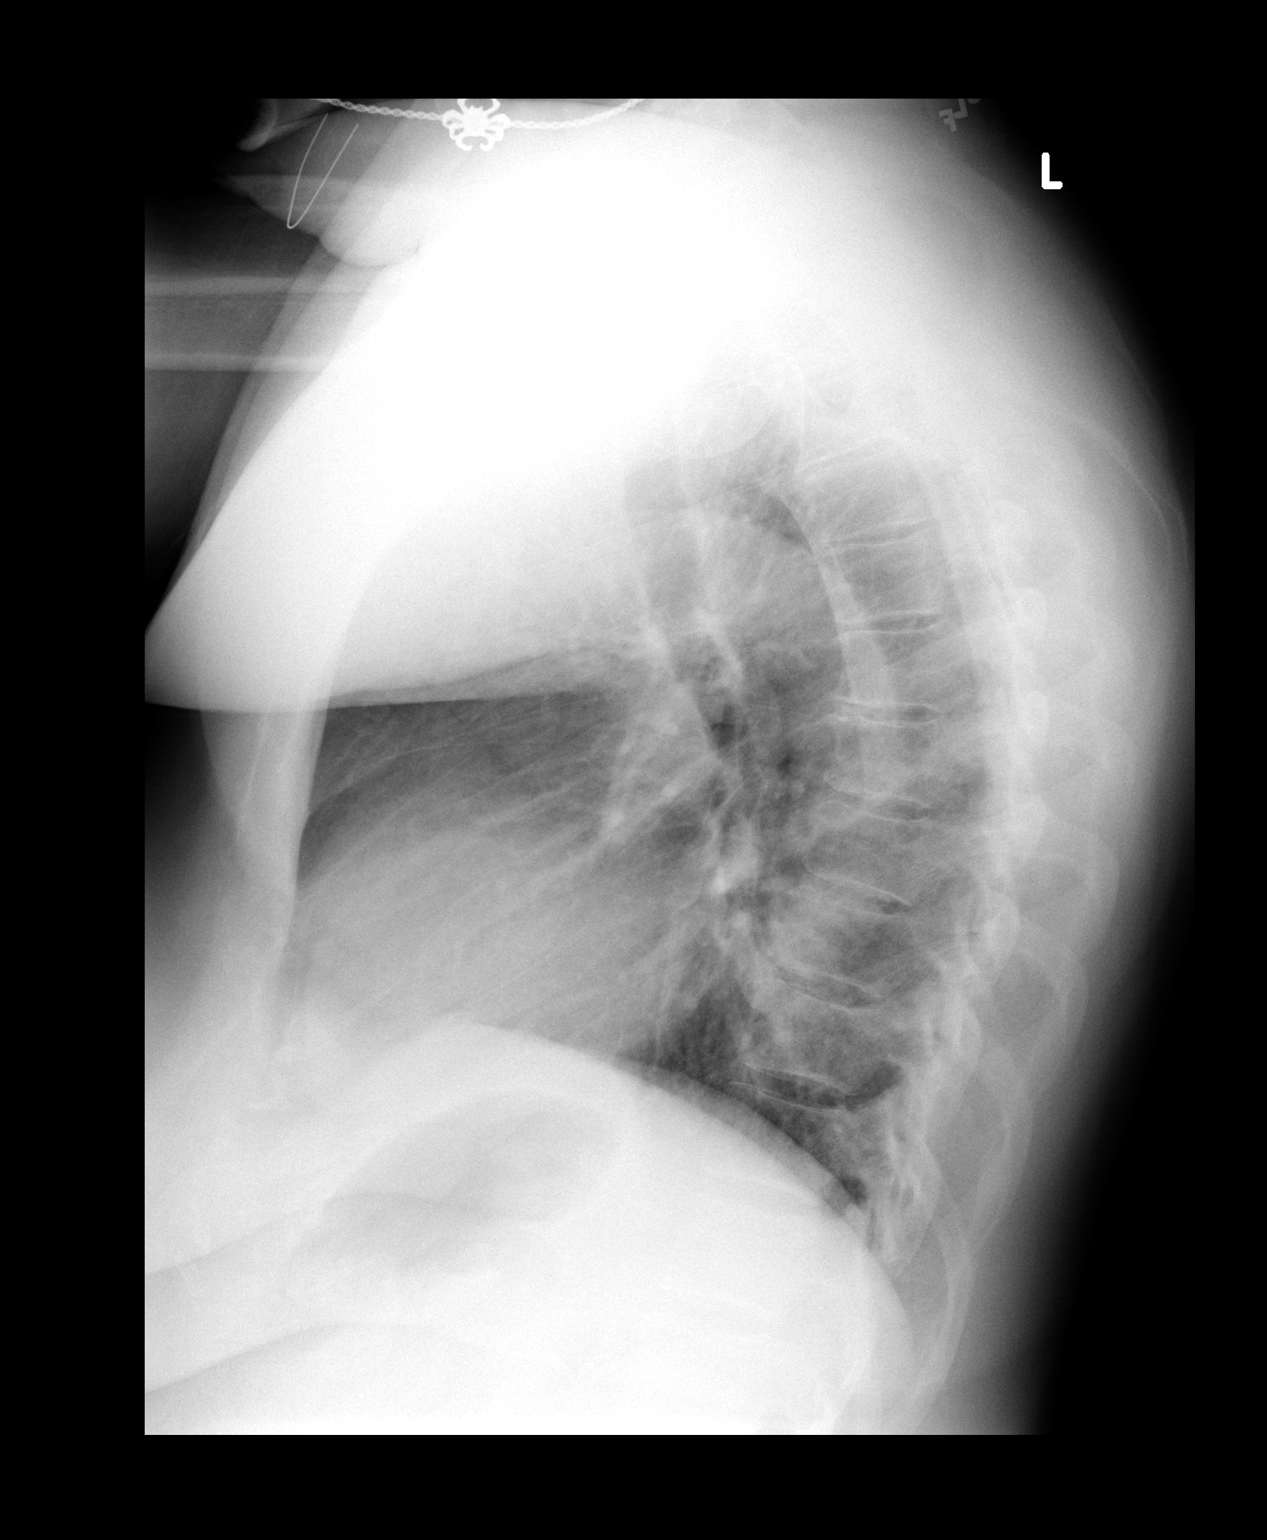

[2 of 2 positions shown; findings below may reference images not displayed]

FINDINGS: Borderline cardiomegaly is noted.  No acute infiltrate or
pleural effusion.  Mild degenerative changes thoracic spine.  Very
mild central increased bronchial markings noted. No pulmonary
edema.  No focal consolidation.
IMPRESSION: No acute infiltrate or edema.  Very mild central increased
bronchial markings noted without focal consolidation.

## 2008-12-08 ENCOUNTER — Emergency Department (HOSPITAL_COMMUNITY): Admission: EM | Admit: 2008-12-08 | Discharge: 2008-12-08 | Payer: Self-pay | Admitting: Emergency Medicine

## 2008-12-08 IMAGING — CR DG CHEST 2V
2 series · 2 of 2 positions shown · non-contrast
Comparison: 09/26/2008

CLINICAL DATA: Chest pain

CHEST - 2 VIEW

[w chest pa]
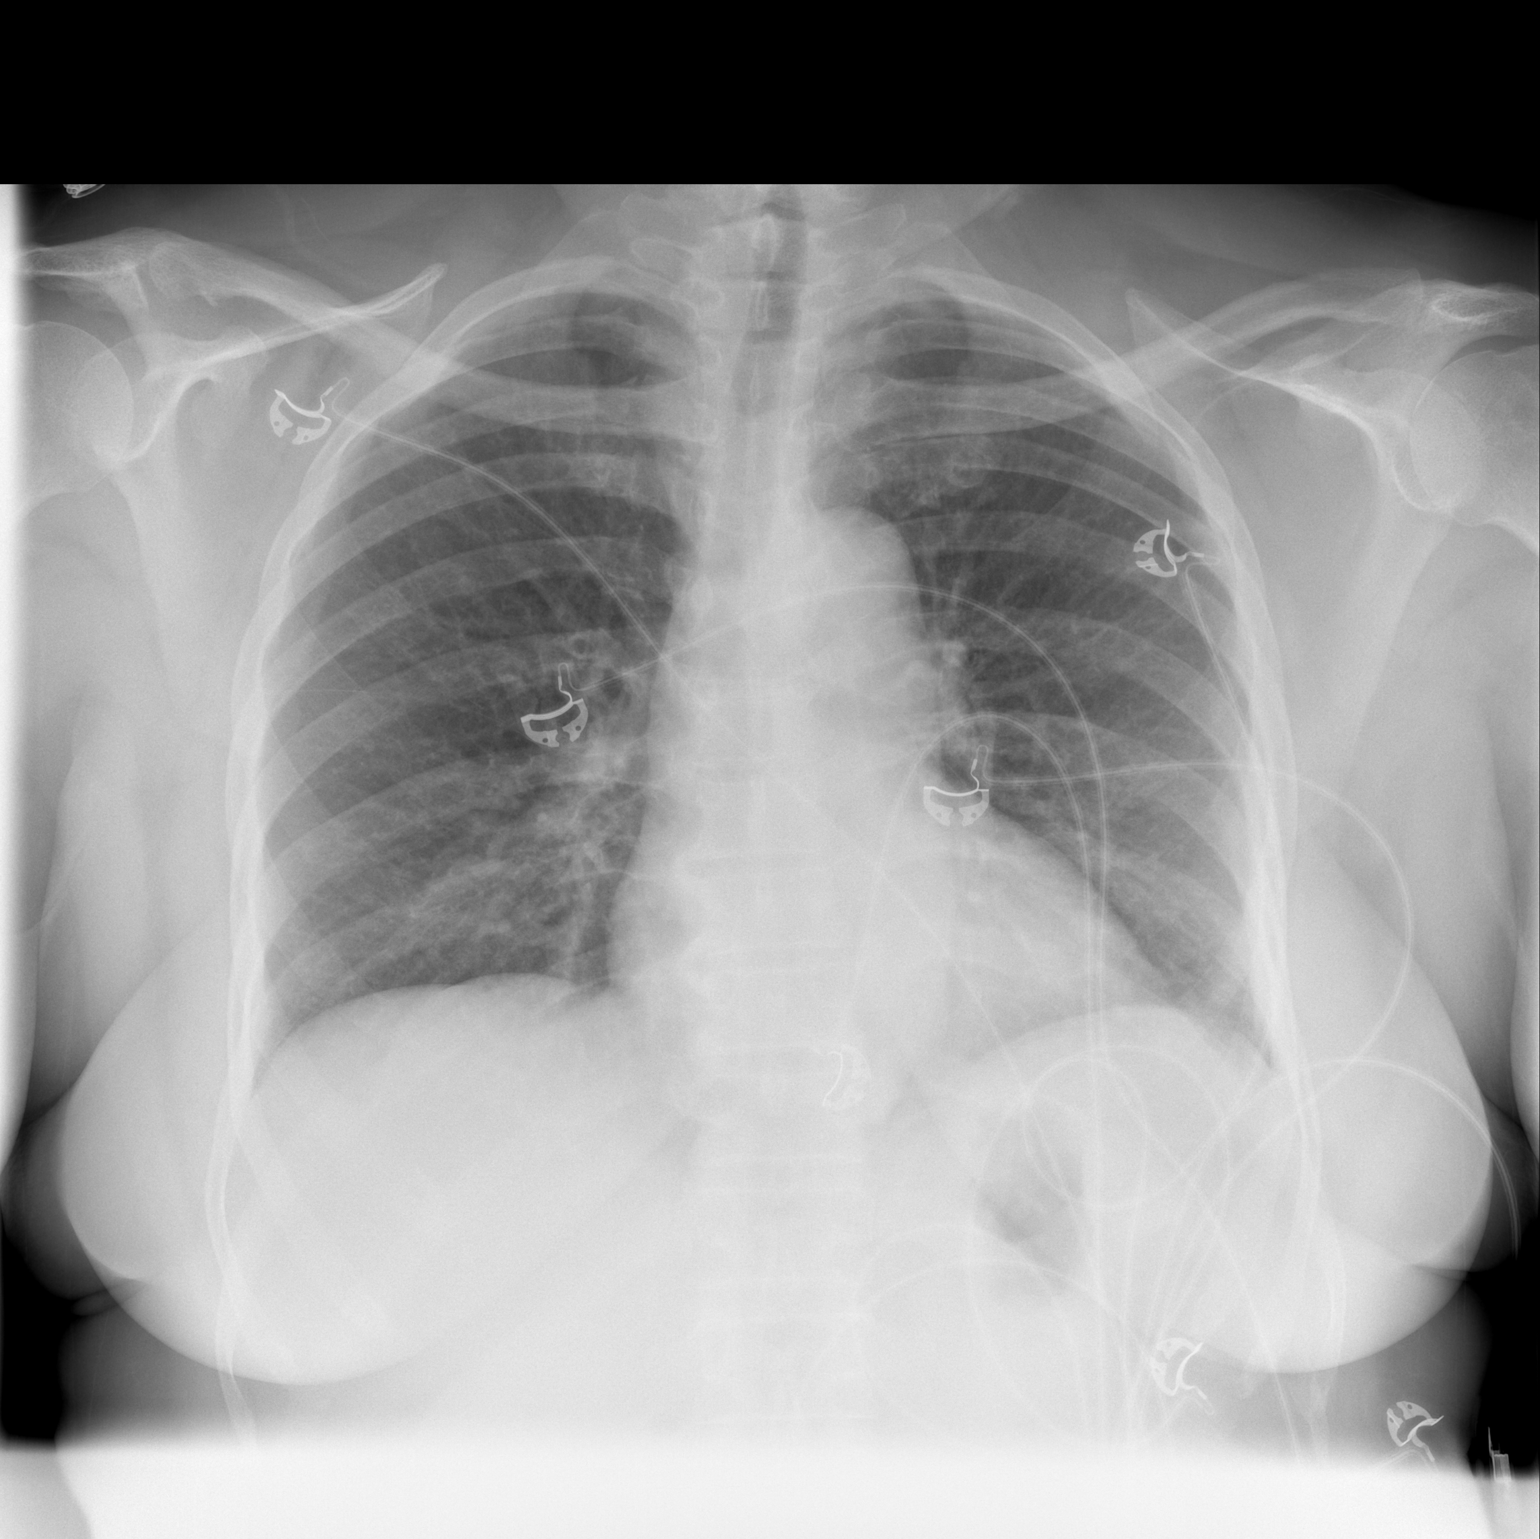

[w chest lat]
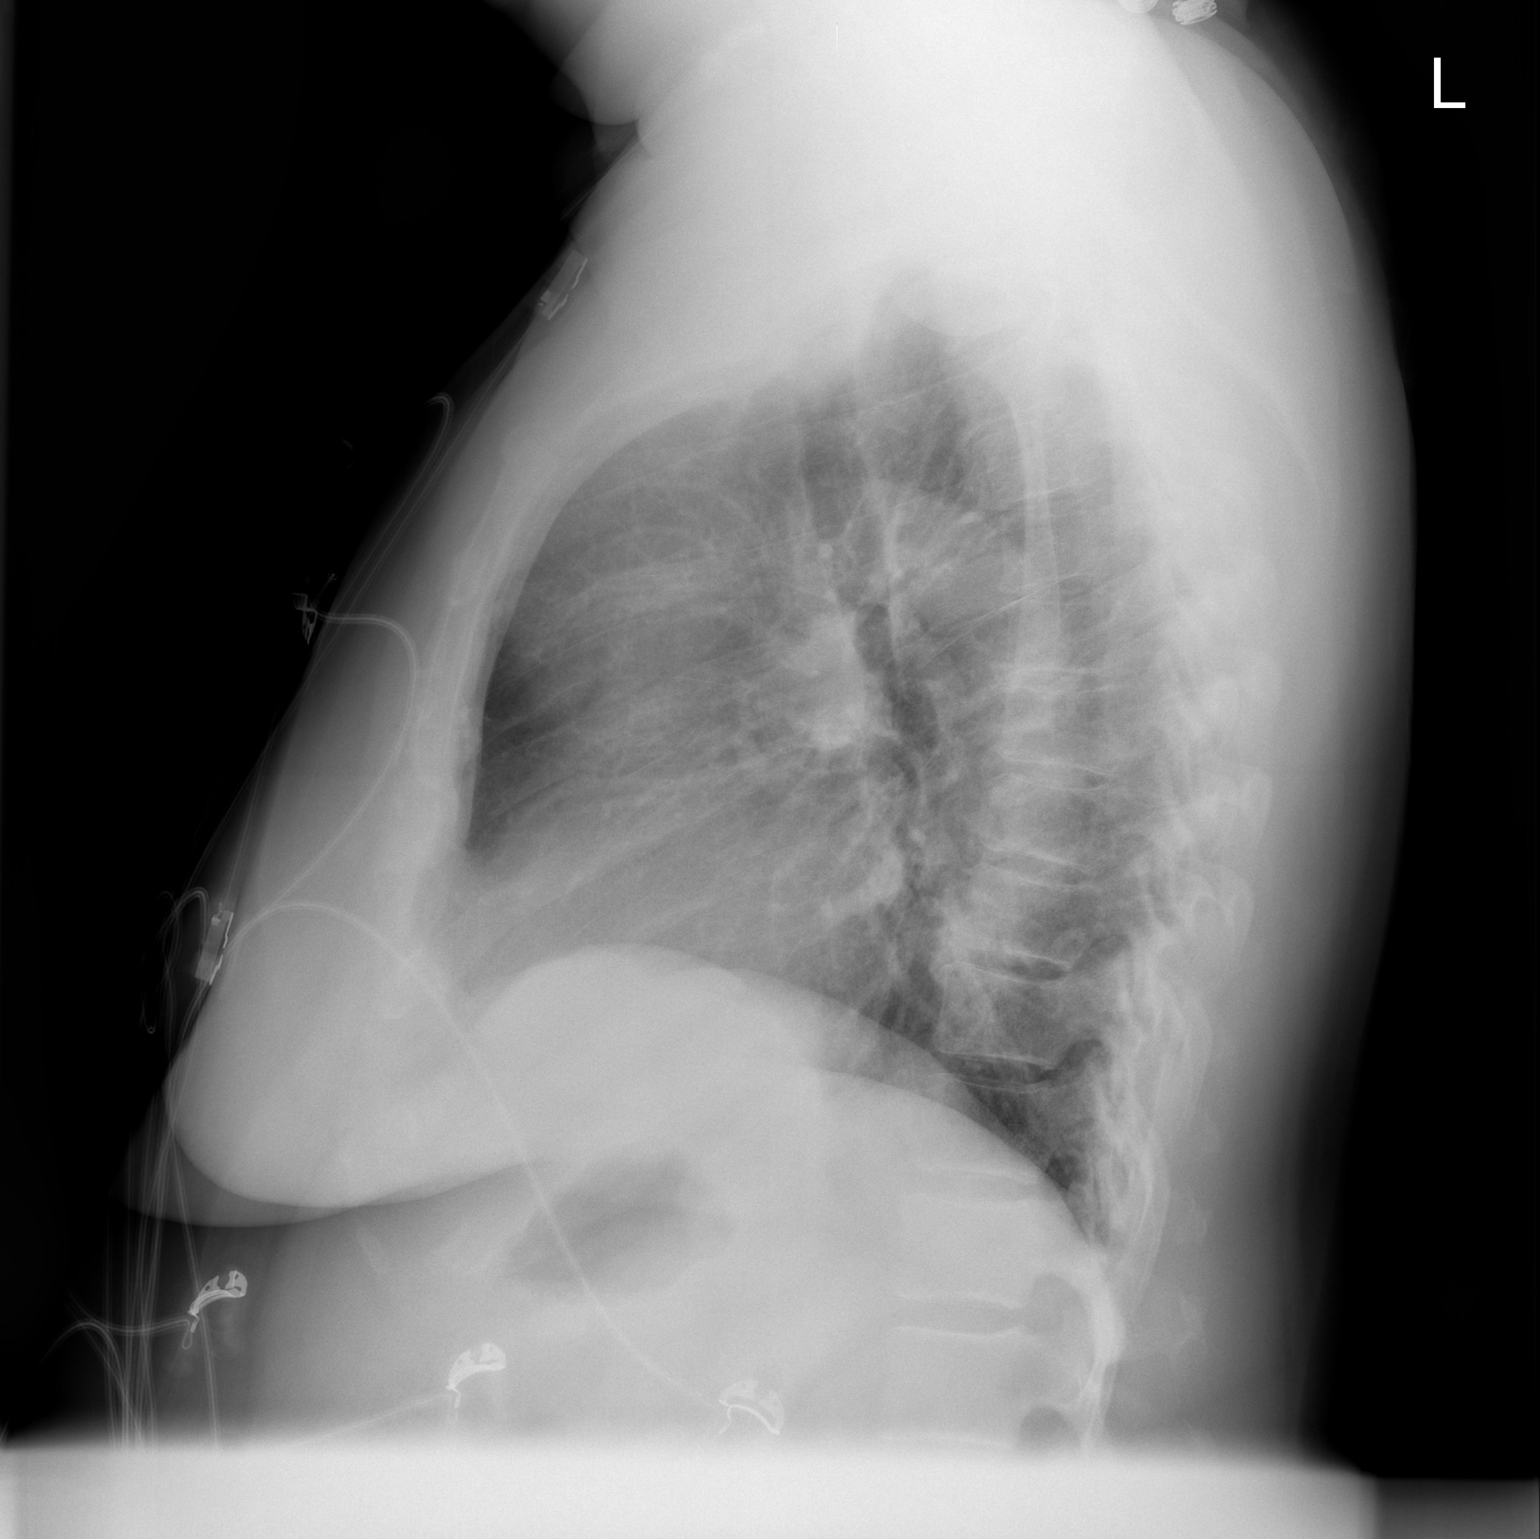

[2 of 2 positions shown; findings below may reference images not displayed]

FINDINGS: Cardiac leads overlie the chest.  Heart size is normal.
Lung volumes are low but clear.  No pleural effusion.  No acute
osseous finding.
IMPRESSION: No acute cardiopulmonary process.

## 2008-12-30 ENCOUNTER — Encounter: Admission: RE | Admit: 2008-12-30 | Discharge: 2008-12-30 | Payer: Self-pay | Admitting: Obstetrics and Gynecology

## 2009-02-27 ENCOUNTER — Encounter: Admission: RE | Admit: 2009-02-27 | Discharge: 2009-02-27 | Payer: Self-pay | Admitting: Internal Medicine

## 2009-02-27 IMAGING — US US ABDOMEN COMPLETE
1 series · 14 of 25 positions shown · non-contrast
Comparison: [HOSPITAL] abdominal ultrasound 10/15/2006
and abdominal CT 03/09/2008.

CLINICAL DATA: Right upper quadrant abdominal pain with nausea.
Hypertension and diabetes.

COMPLETE ABDOMINAL ULTRASOUND

[Series 1: us abdomen complete · 0.33mm/px · 14 of 65 slices shown]
[im 1/65]
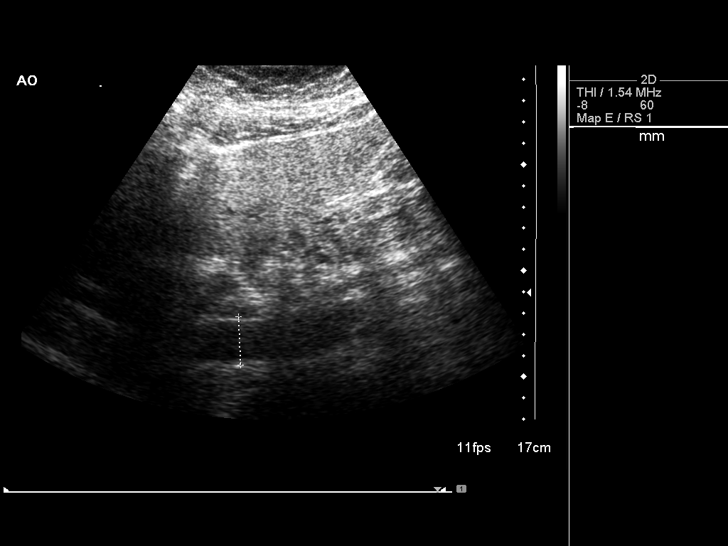
[im 6/65]
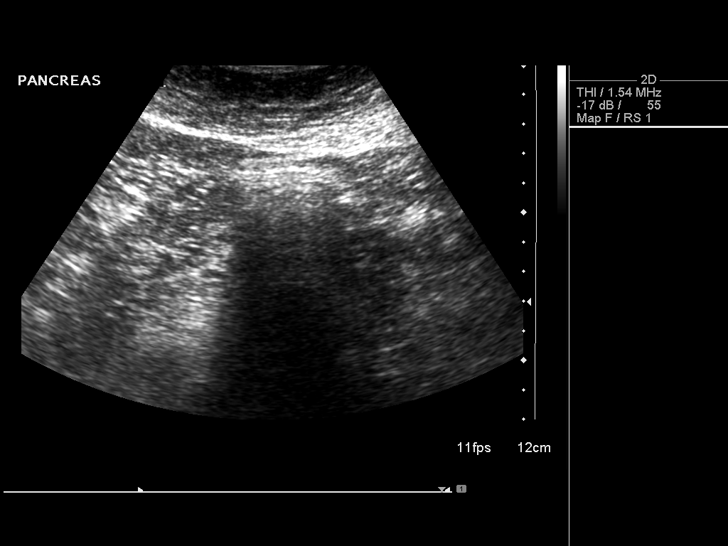
[im 11/65]
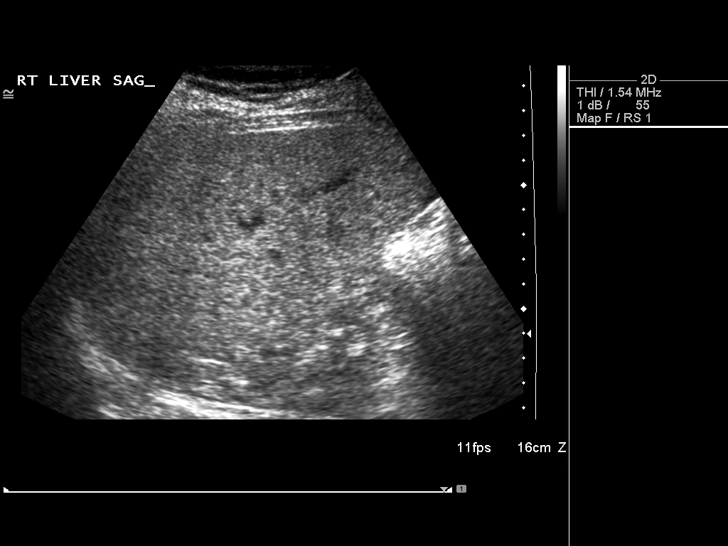
[im 17/65]
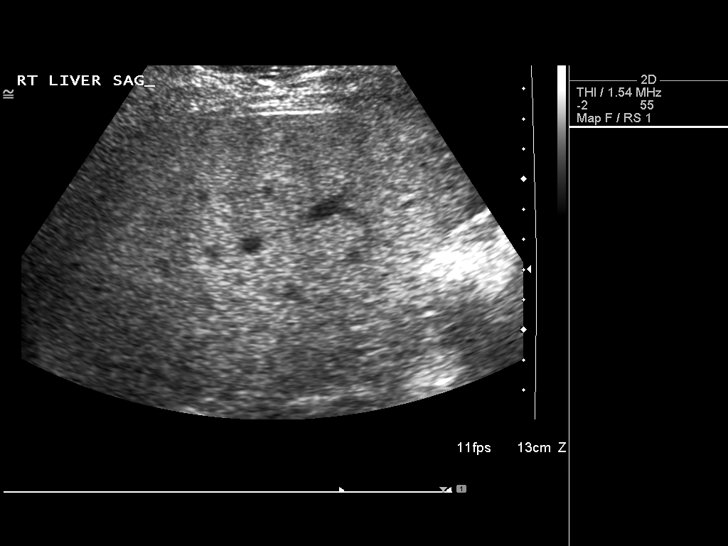
[im 22/65]
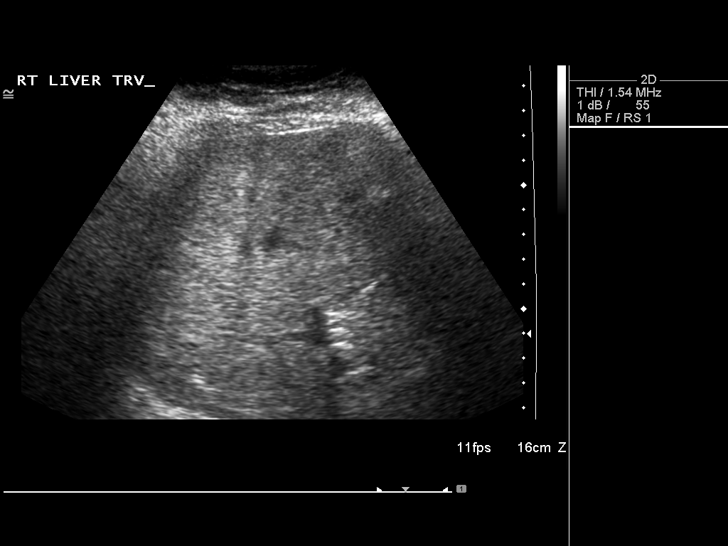
[im 25/65]
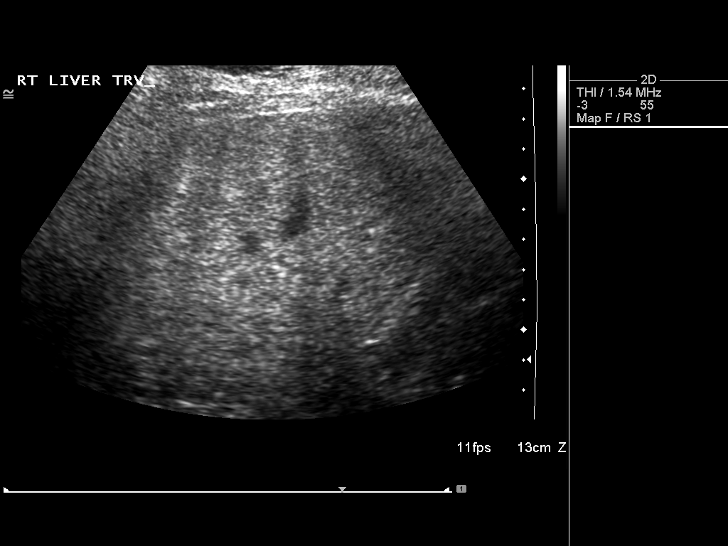
[im 30/65]
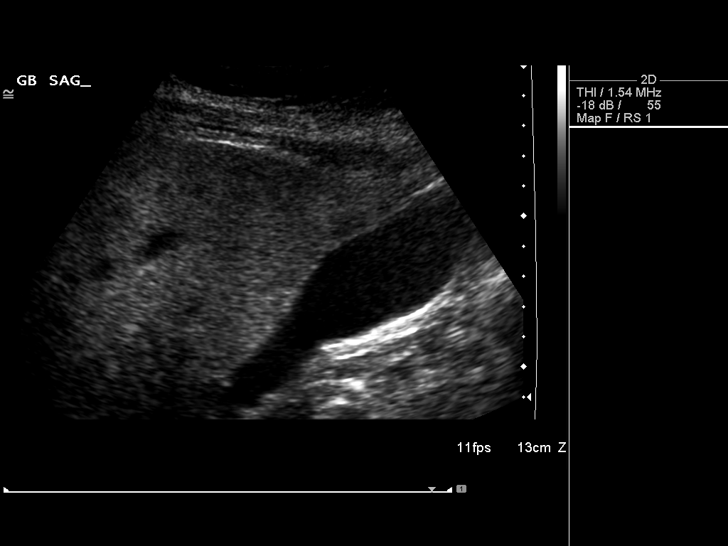
[im 35/65]
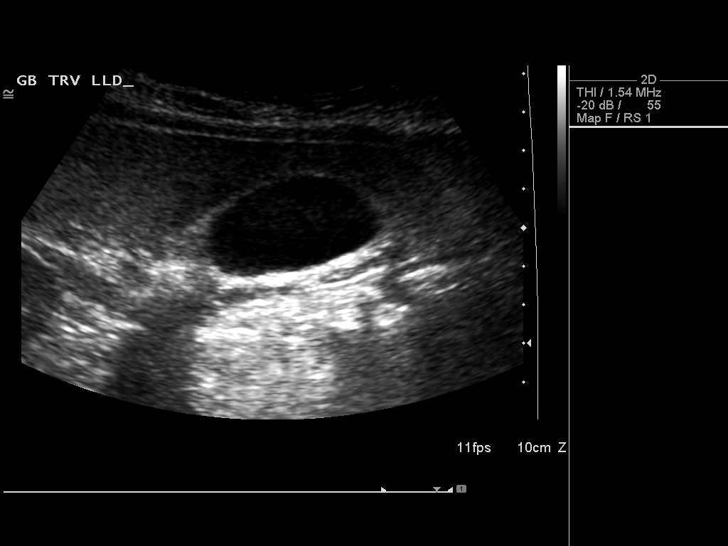
[im 41/65]
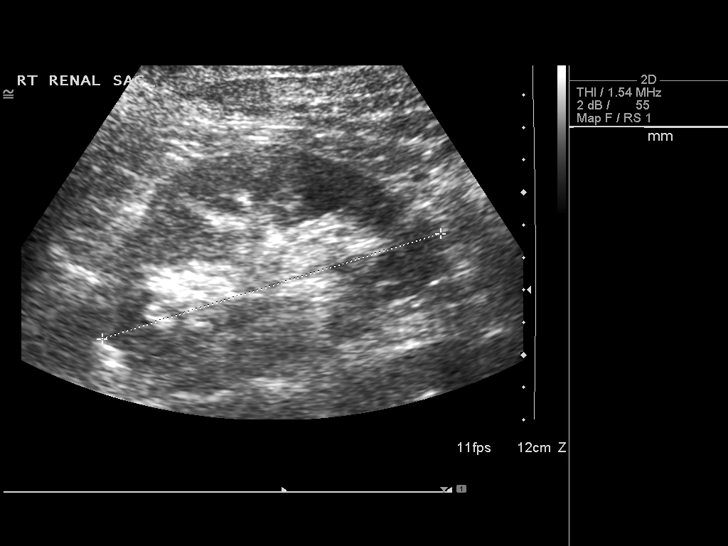
[im 43/65]
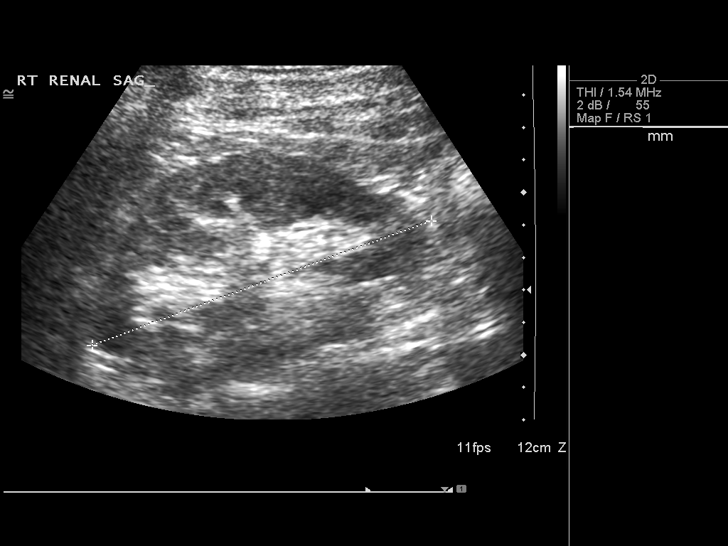
[im 49/65]
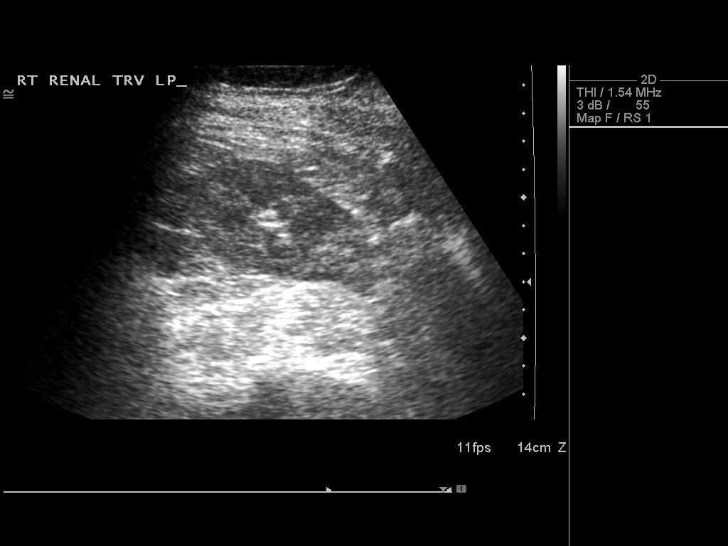
[im 54/65]
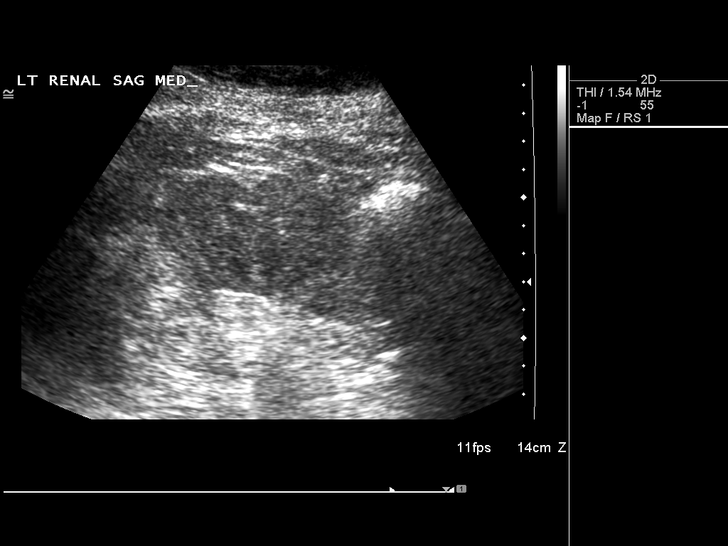
[im 59/65]
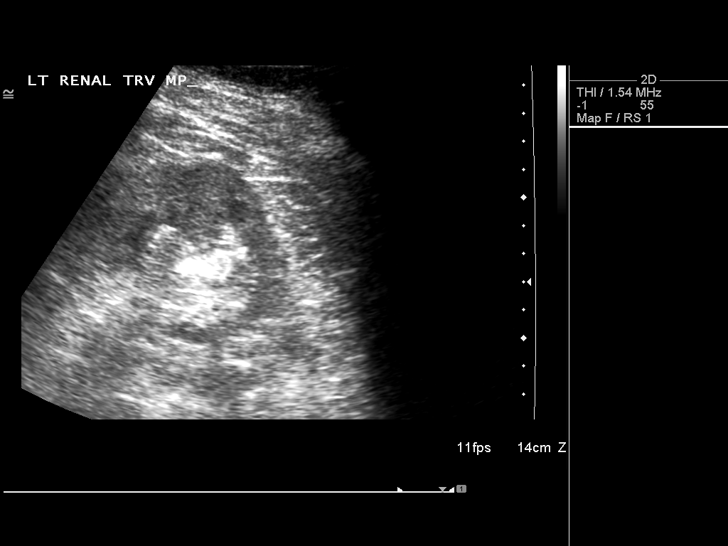
[im 65/65]
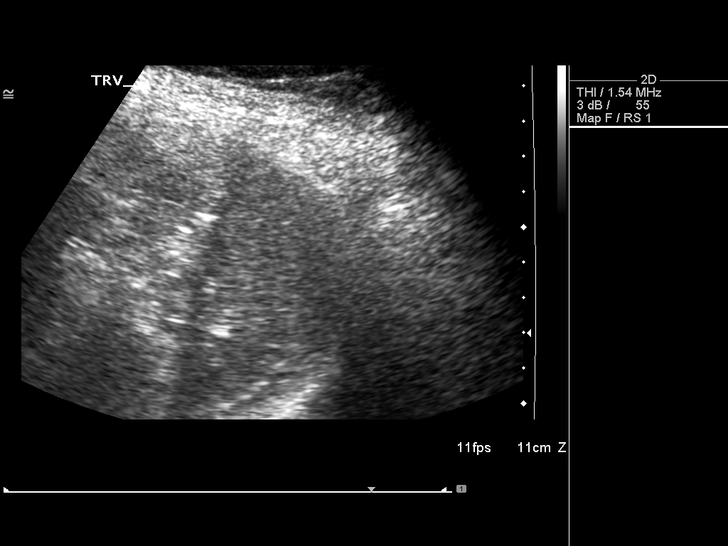

[14 of 25 positions shown; findings below may reference images not displayed]

FINDINGS: Gallbladder:  Gallbladder sonographically normal without sludge or
gallstones with normal 2 mm wall thickness.

Common bile duct:  No dilated intrahepatic or extrahepatic bile
ducts are seen with common bile duct measuring normally at 4 mm.

Liver:  Stable normal sized liver with increased echogenicity of
slight fatty change with no focal liver lesions intervally noted.

IVC:  Sonographically normal.

Pancreas:  Obscured by overlying gastrointestinal gas.

Spleen:  Sonographically normal measuring 4.4 cm long.

Right Kidney:  Sonographically normal measuring 11.1 cm long.

Left Kidney:  Sonographically normal measuring 11.3 cm long.

Abdominal aorta:  Sonographically normal measuring a maximum
diameter 2.3 cm.

Other Findings:  No free fluid.
IMPRESSION: 1.  Obscuration of pancreas.
2.  Stable slight diffuse fatty infiltration liver.
3.  Otherwise, normal.

## 2009-03-12 ENCOUNTER — Ambulatory Visit (HOSPITAL_COMMUNITY): Admission: RE | Admit: 2009-03-12 | Discharge: 2009-03-12 | Payer: Self-pay | Admitting: Gastroenterology

## 2009-12-23 ENCOUNTER — Emergency Department (HOSPITAL_COMMUNITY): Admission: EM | Admit: 2009-12-23 | Discharge: 2009-12-23 | Payer: Self-pay | Admitting: Emergency Medicine

## 2009-12-23 IMAGING — CT CT HEAD W/O CM
1 of 2 series · 13 of 30 positions shown, 17 images · non-contrast
Comparison: 05/03/2005

CLINICAL DATA: Chest pain.  Facial numbness.  Dizziness.

CT HEAD WITHOUT CONTRAST
TECHNIQUE: Contiguous axial images were obtained from the base of
the skull through the vertex without contrast.

[Series 2: brain · axial · 0.47mm/px · z∈[+145,+272]mm · 13 of 28 slices shown, 17 images]
[im 2/28  brain]
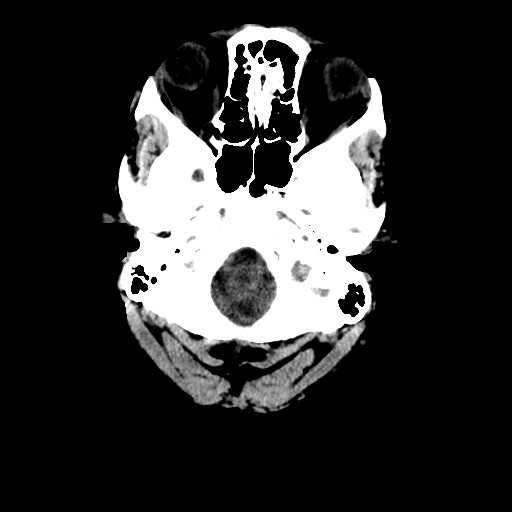
[im 2/28  bone]
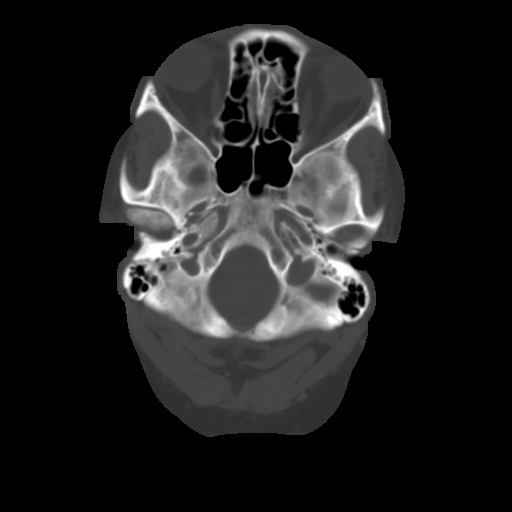
[im 4/28  brain]
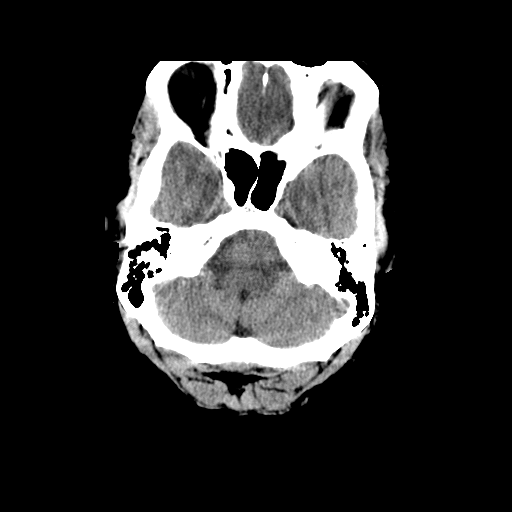
[im 6/28  brain]
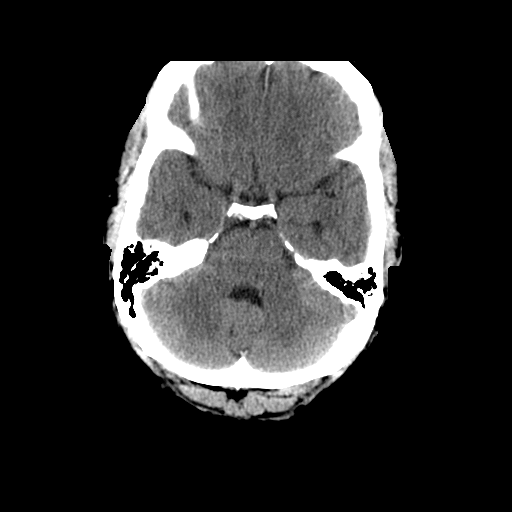
[im 8/28  brain]
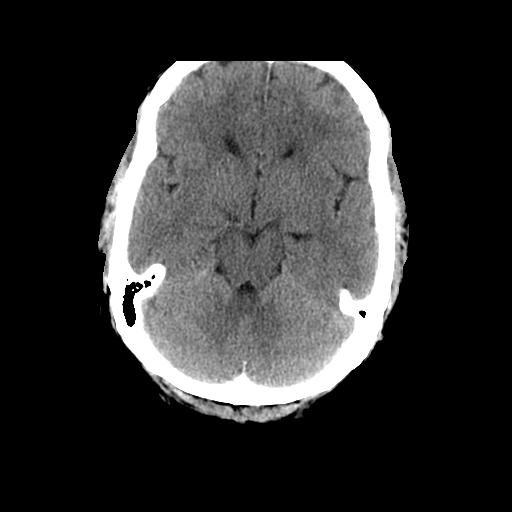
[im 10/28  brain]
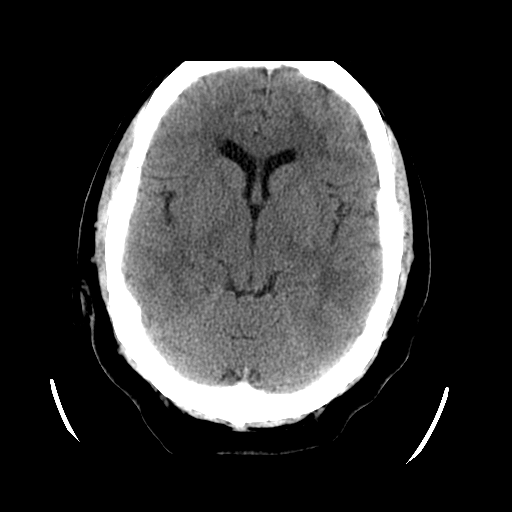
[im 10/28  bone]
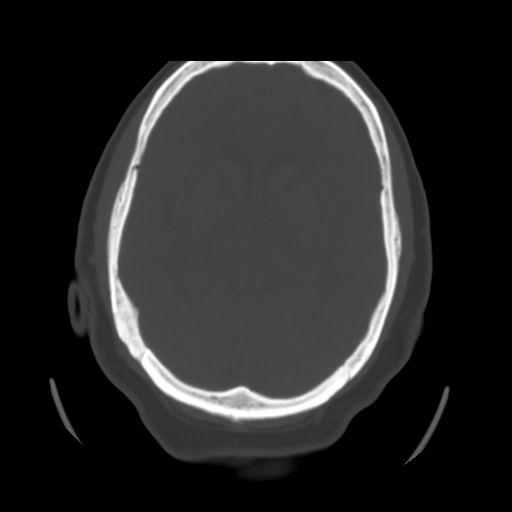
[im 12/28  brain]
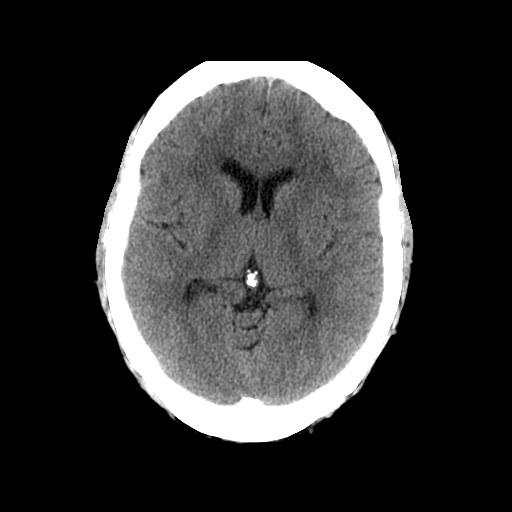
[im 14/28  brain]
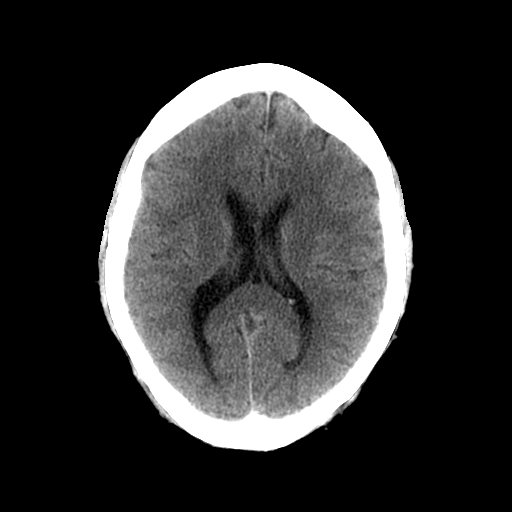
[im 16/28  brain]
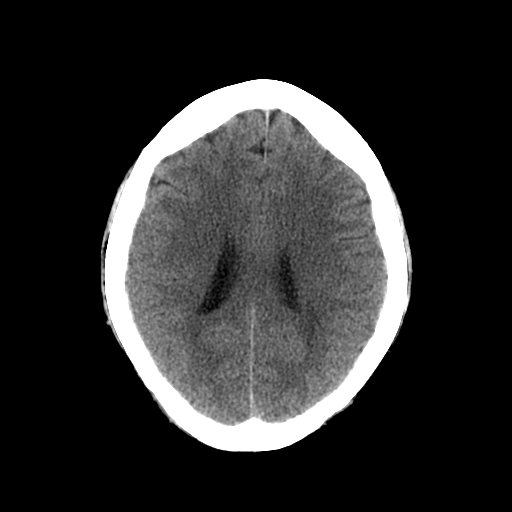
[im 18/28  brain]
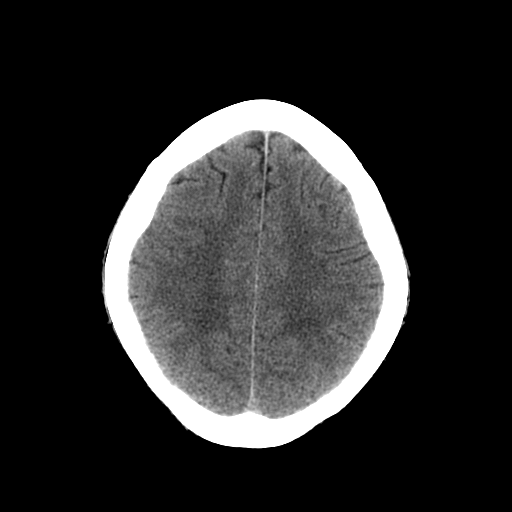
[im 18/28  bone]
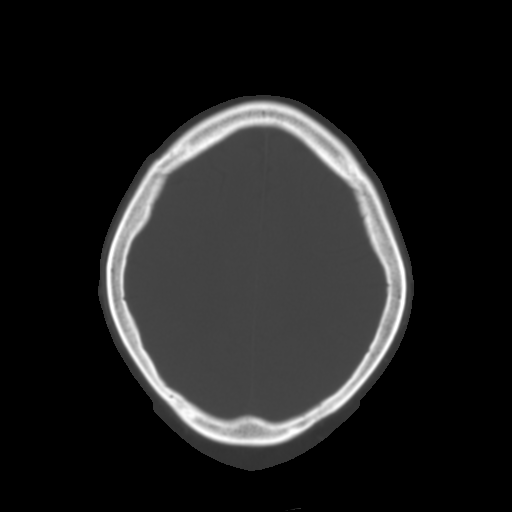
[im 20/28  brain]
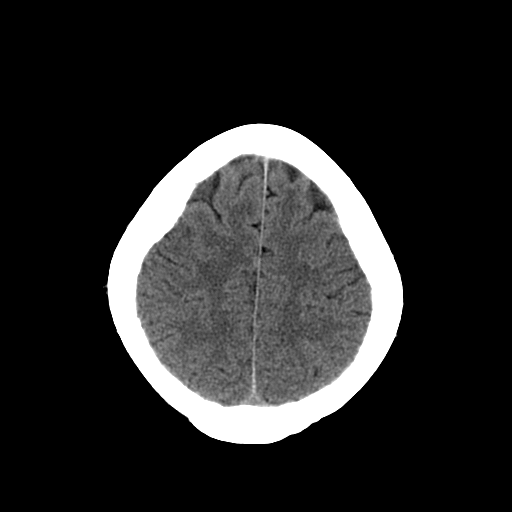
[im 22/28  brain]
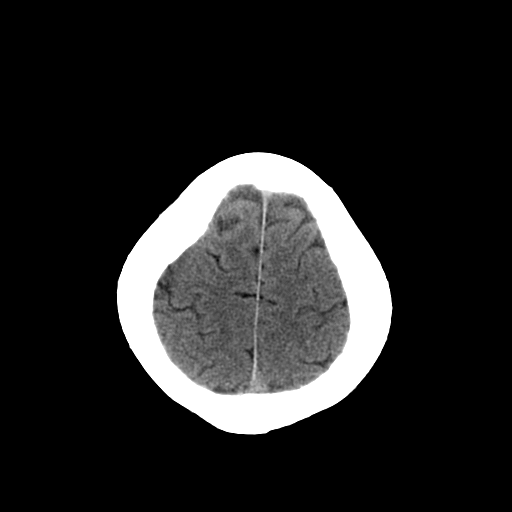
[im 24/28  brain]
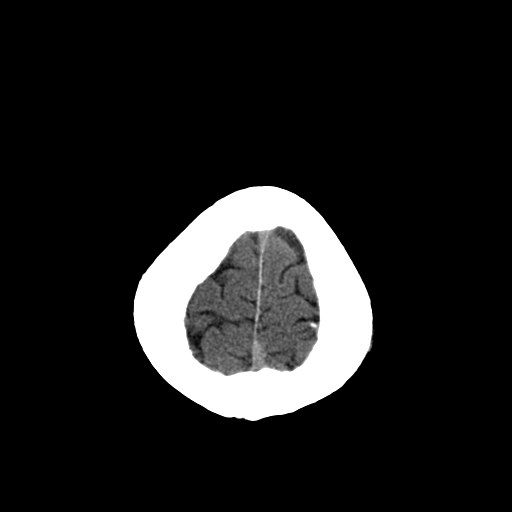
[im 26/28  brain]
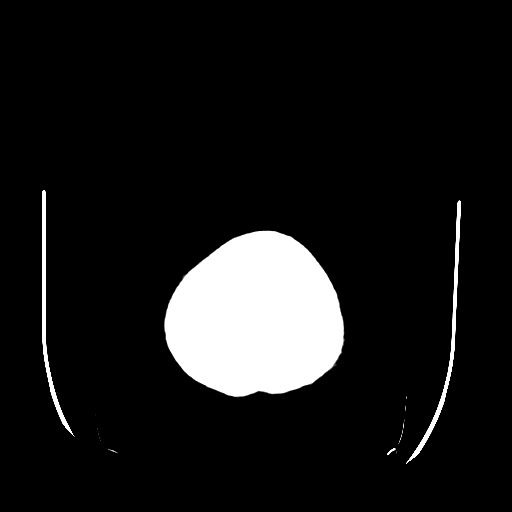
[im 26/28  bone]
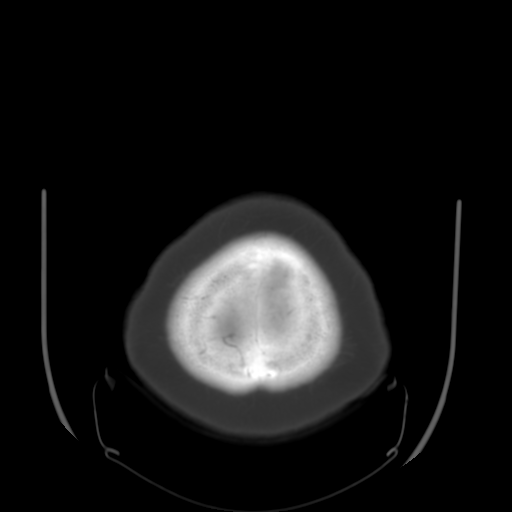

[13 of 30 positions shown; findings below may reference images not displayed]

FINDINGS: No acute intracranial abnormality.  Specifically, no
hemorrhage, hydrocephalus, mass lesion, acute infarction, or
significant intracranial injury.  No acute calvarial abnormality.
Visualized paranasal sinuses and mastoids clear.  Orbital soft
tissues unremarkable.
IMPRESSION: Normal for patient's age.

## 2009-12-23 IMAGING — CR DG CHEST 1V PORT
1 series · 1 of 1 positions shown · non-contrast
Comparison: 12/08/2008 and 09/26/2008.

CLINICAL DATA: Chest pain.

PORTABLE CHEST - 1 VIEW

[AP]
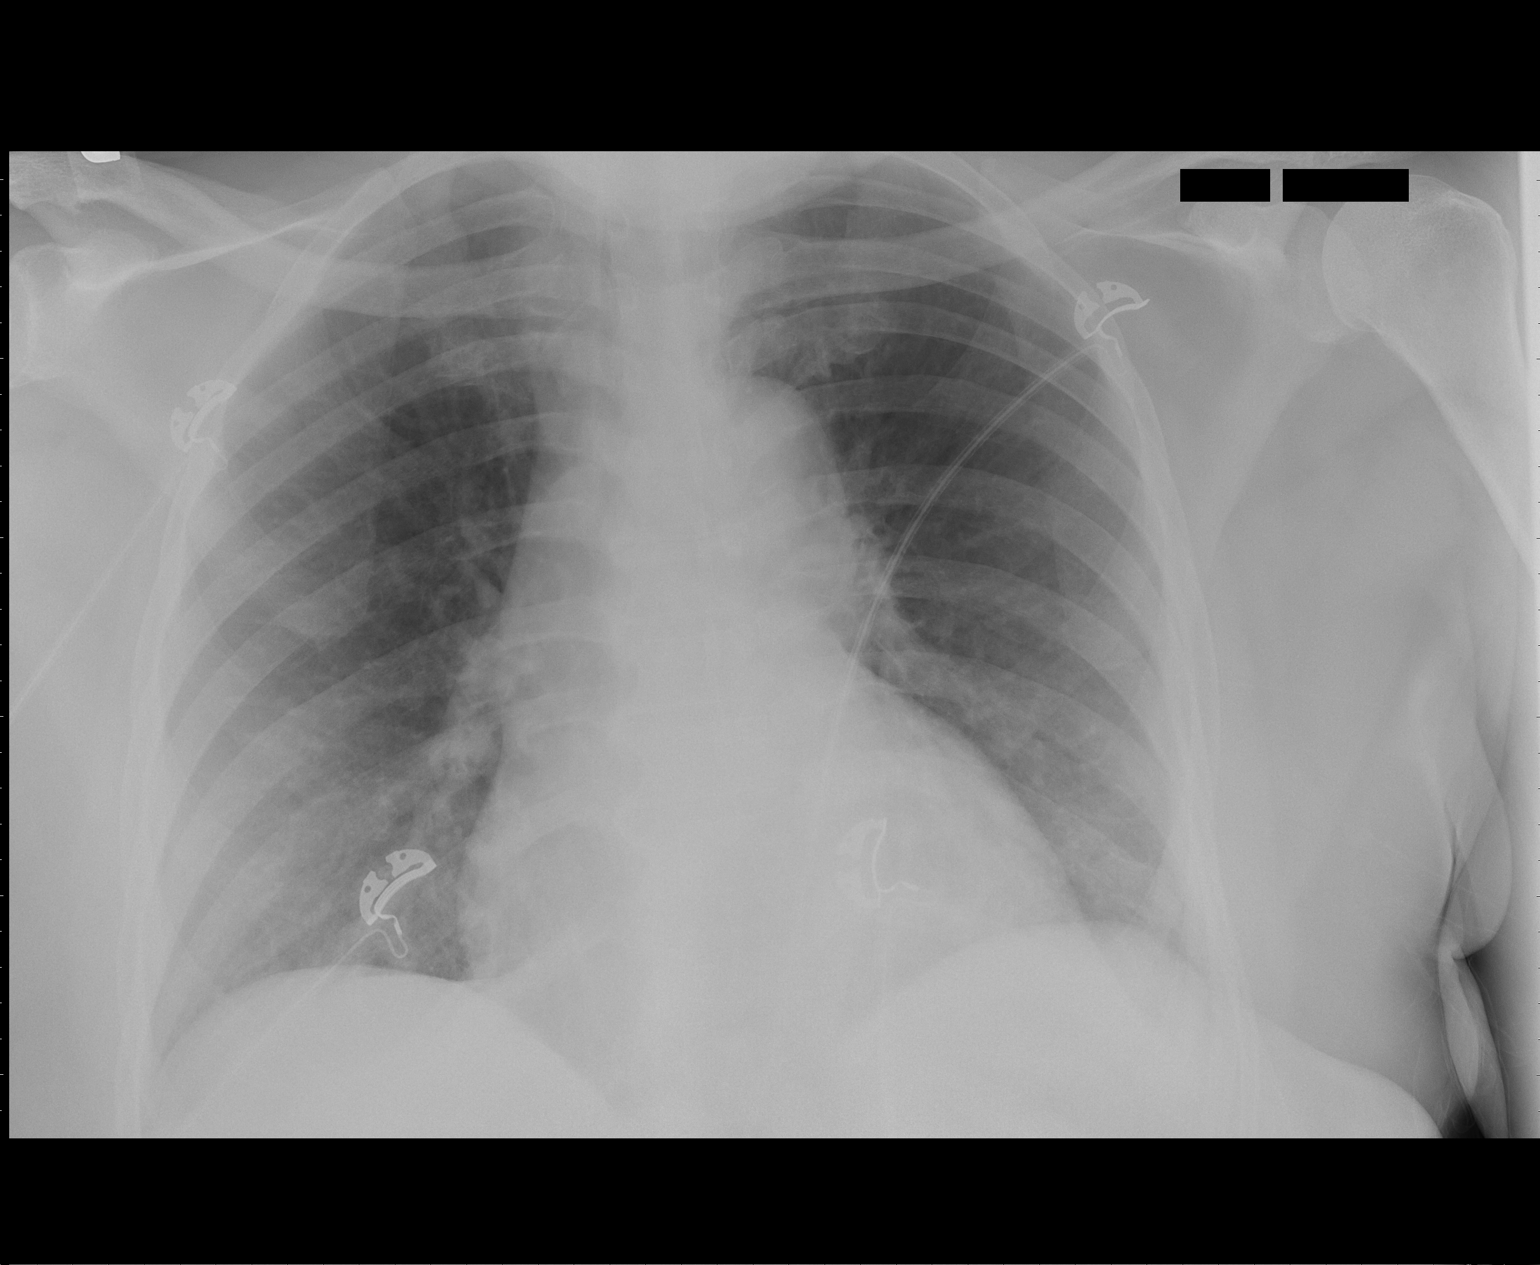

[1 of 1 positions shown; findings below may reference images not displayed]

FINDINGS: 2694 hours.  The heart size and mediastinal contours are
stable.  The lungs are clear.  There is no pleural effusion or
pneumothorax.  No acute osseous findings are identified.
IMPRESSION: Stable examination.  No active cardiopulmonary process.

## 2010-04-04 ENCOUNTER — Emergency Department (HOSPITAL_COMMUNITY): Admission: EM | Admit: 2010-04-04 | Discharge: 2010-04-04 | Payer: Self-pay | Admitting: Emergency Medicine

## 2010-11-22 ENCOUNTER — Encounter: Payer: Self-pay | Admitting: Obstetrics and Gynecology

## 2011-01-18 LAB — URINALYSIS, ROUTINE W REFLEX MICROSCOPIC
Hgb urine dipstick: NEGATIVE
Nitrite: NEGATIVE
Specific Gravity, Urine: 1.022 (ref 1.005–1.030)
Urobilinogen, UA: 0.2 mg/dL (ref 0.0–1.0)

## 2011-01-22 LAB — CBC
HCT: 39.1 % (ref 36.0–46.0)
MCHC: 33.8 g/dL (ref 30.0–36.0)
MCV: 93.2 fL (ref 78.0–100.0)
WBC: 5 10*3/uL (ref 4.0–10.5)

## 2011-01-22 LAB — POCT I-STAT, CHEM 8
Calcium, Ion: 1.04 mmol/L — ABNORMAL LOW (ref 1.12–1.32)
HCT: 40 % (ref 36.0–46.0)
Hemoglobin: 13.6 g/dL (ref 12.0–15.0)
TCO2: 26 mmol/L (ref 0–100)

## 2011-01-22 LAB — DIFFERENTIAL
Basophils Absolute: 0 10*3/uL (ref 0.0–0.1)
Basophils Relative: 0 % (ref 0–1)
Eosinophils Absolute: 0 10*3/uL (ref 0.0–0.7)
Eosinophils Relative: 1 % (ref 0–5)
Neutro Abs: 2.7 10*3/uL (ref 1.7–7.7)

## 2011-01-22 LAB — URINALYSIS, ROUTINE W REFLEX MICROSCOPIC
Glucose, UA: NEGATIVE mg/dL
Hgb urine dipstick: NEGATIVE
Protein, ur: NEGATIVE mg/dL
Specific Gravity, Urine: 1.015 (ref 1.005–1.030)
pH: 6 (ref 5.0–8.0)

## 2011-01-22 LAB — LIPASE, BLOOD: Lipase: 42 U/L (ref 11–59)

## 2011-01-22 LAB — POCT CARDIAC MARKERS

## 2011-01-22 LAB — GLUCOSE, CAPILLARY

## 2011-02-21 ENCOUNTER — Emergency Department (INDEPENDENT_AMBULATORY_CARE_PROVIDER_SITE_OTHER): Payer: No Typology Code available for payment source

## 2011-02-21 ENCOUNTER — Emergency Department (HOSPITAL_BASED_OUTPATIENT_CLINIC_OR_DEPARTMENT_OTHER)
Admission: EM | Admit: 2011-02-21 | Discharge: 2011-02-21 | Disposition: A | Discharge status: Home / Self Care | Payer: No Typology Code available for payment source | Attending: Emergency Medicine | Admitting: Emergency Medicine

## 2011-02-21 DIAGNOSIS — M25519 Pain in unspecified shoulder: Secondary | ICD-10-CM | POA: Insufficient documentation

## 2011-02-21 DIAGNOSIS — M542 Cervicalgia: Secondary | ICD-10-CM | POA: Insufficient documentation

## 2011-02-21 DIAGNOSIS — T07XXXA Unspecified multiple injuries, initial encounter: Secondary | ICD-10-CM | POA: Insufficient documentation

## 2011-02-21 DIAGNOSIS — Y92410 Unspecified street and highway as the place of occurrence of the external cause: Secondary | ICD-10-CM | POA: Insufficient documentation

## 2011-02-21 DIAGNOSIS — R51 Headache: Secondary | ICD-10-CM

## 2011-02-21 DIAGNOSIS — K219 Gastro-esophageal reflux disease without esophagitis: Secondary | ICD-10-CM | POA: Insufficient documentation

## 2011-02-21 DIAGNOSIS — I10 Essential (primary) hypertension: Secondary | ICD-10-CM | POA: Insufficient documentation
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
## 2011-03-19 NOTE — Discharge Summary (Signed)
Diane [name], Diane [name]                ACCOUNT NO.:  [id number]   MEDICAL RECORD NO.:  [id number]          PATIENT TYPE:  INP   LOCATION:  3705                         FACILITY:  [facility]   PHYSICIAN:  Isidor Holts, M.D.  DATE OF BIRTH:  [birth year]   DATE OF ADMISSION:  12/22/2005  DATE OF DISCHARGE:                                 DISCHARGE SUMMARY   PRIMARY MEDICAL DOCTOR:  Dr. Juline Patch   DISCHARGE DIAGNOSES:  1.  Atypical chest pain.  2.  Recurrent left arm pain, likely neuropathic, secondary to cervical      degenerative joint disease.  3.  Hypertension.  4.  Obesity.  5.  Dyslipidemia.   DISCHARGE MEDICATIONS:  1.  Aspirin 81 mg p.o. daily (over-the-counter).  2.  Coreg 12.5 mg p.o. b.i.d.  3.  Micardis 80 mg p.o. b.i.d.  4.  Motrin 600 mg p.o. t.i.d. with food for 1 week and then p.r.n. q.8h.   PROCEDURES:  1.  Portable chest x-ray dated December 22, 2005. This showed cardiomegaly,      central pulmonary vascular prominence, tortuous aorta.  2.  X-ray left humerus, December 21, 2005. This showed no evidence of      fracture or other focal bony lesions. Soft tissues are unremarkable.  3.  Chest CT angiogram dated December 23, 2005. This showed no acute      abnormality; specifically, no evidence of aortic dissection, aneurysm,      or pulmonary emboli. Cardiomegaly is noted.  4.  Venous Doppler, left upper extremity, dated December 21, 2005. This was      negative for DVT.   CONSULTATIONS:  Dr. Yates Decamp, cardiologist.   ADMISSION HISTORY:  As in H&P notes of December 22, 2005, dictated by Dr.  Michaelyn Barter. However, in brief, this is a 65 year old female, with  known history of hypertension, dyslipidemia, stress incontinence, who  presents with severe left arm pain, which appeared to be recurrent,  described as a stabbing sensation, primarily located over the left forearm  associated with some numbness and tingling. Also, occasional left-sided  sensation of  pressure across her chest, not associated with shortness of  breath. This responded to morphine in the emergency department. The patient  was considered to have risk factors for coronary artery disease. She was  therefore admitted for further evaluation, investigation, and management.   CLINICAL COURSE:  #1 - ATYPICAL CHEST PAIN. The patient presents with chest  pain, which is somewhat atypical in description, described as a heaviness  across the left side of her chest, traveling to her back, but not her neck.  Sometimes associated with sharp stabbing left arm pain, associated with  tingling/numbness. The patient was initially placed on intravenous infusion  of nitroglycerin along with p.r.n. morphine. She was already on beta blocker  at the time of presentation. Aspirin was added to treatment. Cardiology  consultation was called, which was kindly provided by Dr Yates Decamp who  elicited on detailed history, that the patient had been investigated for  chest pain in April 2004, subsequently had cardiac catheterization with no  significant  CAD. Ejection fraction at that time was 45-50%. She was also  followed up by Dr. Jenne Campus in September 2005. EKG was negative for ischemic  changes and cardiac enzymes remained unelevated. Per cardiology, it is  unlikely the patient has significant coronary artery disease. Recommendation  was to discontinue nitrates and carry out risk factor modification. The  patient was noted to have a somewhat elevated D-dimer at 0.68. She therefore  underwent chest CT angiogram on December 23, 2005, which was negative for  PE. Also showed no evidence of aortic aneurysm or dissection. As of a.m.  December 24, 2005, the patient was asymptomatic and keen to be discharged.   #2 - RECURRENT LEFT ARM PAIN. This aching has some atypical features,  including association with numbness/tingling. However, musculoskeletal  system examination revealed no neck stiffness, no restriction  of neck  movements or shoulder movements. It did not prove possible to reproduce the  pain. Suspicion, is that this is likely musculoskeletal in addition to a  neuropathic component, possibly from radiculopathy from cervical DJD. The  patient has been commenced on NSAID therapy and it is recommended that she  follow up with her PMD, Dr. Juline Patch, in due course. Perhaps imaging of  the neck with MRI will be indicated if symptoms persist. The patient did  have a humeral x-ray which was negative for any bony or soft tissue  abnormality. She also had ultrasound evaluation of the affected upper  extremity which was negative for DVT.   #3 - DYSLIPIDEMIA. The patient carries a diagnosis of dyslipidemia, although  she does not appear to be on a statin at present. Review of lipid profile  showed the following findings:  Total cholesterol 180, triglycerides 89, HDL  44, LDL 118. This appears to be an acceptable profile. She has been  reassured accordingly.   #4 - HYPERTENSION. This was adequately controlled with beta blockade and  Micardis during the course of the patient's hospital stay.   DISPOSITION:  The patient was discharged in satisfactory condition on  December 24, 2005.   DIET:  Healthy heart diet.   ACTIVITY:  As tolerated.   WOUND CARE:  Not applicable.   PAIN MANAGEMENT:  Not applicable.   FOLLOW-UP INSTRUCTIONS:  The patient is instructed to follow up with her  PMD, Dr. Juline Patch, within 1 week. She has the phone number and has been  instructed to call for an appointment. She has verbalized understanding.      Isidor Holts, M.D.  Electronically Signed     CO/MEDQ  D:  12/24/2005  T:  12/24/2005  Job:  [id number]   cc:   Juline Patch, M.D.  Fax: [phone]   Cristy Hilts. Jacinto Halim, MD  Fax: [phone]

## 2011-03-19 NOTE — Discharge Summary (Signed)
NAME:  Diane [name], Diane [name]                          ACCOUNT NO.:  [id number]   MEDICAL RECORD NO.:  [id number]                   PATIENT TYPE:  INP   LOCATION:  9138                                 FACILITY:  [facility]   PHYSICIAN:  Randye Lobo, M.D.                DATE OF BIRTH:  02-02-1946   DATE OF ADMISSION:  06/25/2002  DATE OF DISCHARGE:  06/27/2002                                 DISCHARGE SUMMARY   ADMISSION DIAGNOSES:  1. Simple endometrial hyperplasia.  2. Potential genuine stress incontinence.   DISCHARGE DIAGNOSES:  1. Simple endometrial hyperplasia.  2. Uterine leiomyoma.  3. Adenomyosis.  4. Potential genuine stress incontinence.  5. Status post total abdominal hysterectomy, bilateral salpingo-     oophorectomy, abdominal Burch procedure, cystoscopy, suprapubic catheter     placement.   SIGNIFICANT OPERATIONS AND PROCEDURES:  Total abdominal hysterectomy with  bilateral salpingo-oophorectomy and abdominal Burch procedure with  cystoscopy and suprapubic catheter placement at the [facility] on  06/25/02 under the direction of Dr. Conley Simmonds and with the assistance of  Dr. Malva Limes.   PERTINENT HISTORY AND PHYSICAL EXAMINATION:  The patient is a 65 year old  gravida 3, para 3 African-American female, status post bilateral tubal  ligation, who presented to the office with post menopausal bleeding and a  complaint of urinary incontinence with coughing and sneezing.  Endometrial  biopsy in the office documented simple endometrial hyperplasia without  atypia and an endometrial thickness measuring 10.4 mm on ultrasound.  The  patient underwent simple office cystometrics, and maximal bladder capacity  was 350 cc at which time the patient had a strong urge.  No detrusor  instability was appreciated and no genuine stress incontinence was noted at  this time.  Because of the patient's postmenopausal bleeding and the urinary  incontinence which was significant for her,  a plan was made to proceed with  surgical treatment after risks, benefits and alternatives were discussed  with her.   PAST MEDICAL HISTORY:  The patient's past medical history was significant  for hypertension controlled by Altace and a history of deep venous  thrombosis with each of her three pregnancies.   HOSPITAL COURSE:  The patient was admitted on 06/25/02 at which time she  underwent a total abdominal hysterectomy with bilateral salpingo-  oophorectomy and abdominal Burch procedure with cystoscopy and suprapubic  catheter placement under the direction of Dr. Conley Simmonds with the  assistance of Dr. Malva Limes.  The findings at surgery documented a  normal uterus and ovaries.  The fallopian tubes are consistent with a  bilateral tubal ligation and were otherwise normal. The appendix was normal  in appearance.  The cystoscopy demonstrated patent ureters bilaterally and  no evidence of sutures in the urethra or the bladder.  Estimated blood loss  from surgery was 150 cc.  There were no complications.   The patient was treated during  her hospitalization for deep venous  thrombosis prophylaxis with both preoperative and postoperative heparin 5000  units subcutaneous q.12h. in addition to TED hose and PAS compression  stockings.  The patient also had early ambulation.  The patient had no  problems with any pain or discomfort in her lower extremities throughout her  hospitalization.   The patient's postoperative course was unremarkable. She initially had a  morphine PCA which was successfully converted over to Percocet and Motrin  which controlled her pain well.  The patient had her diet advanced to normal  which she was tolerating at the time of discharge.   The patient was able to void spontaneously 150 to 375 cc with no residuals.  The suprapubic catheter was therefore removed on postoperative day #2 prior  to her discharge.  The patient's final pathology report documented a  benign  cervix with chronic cervicitis.  There was simple endometrial hypoplasia  associated with focal polypoid mucosa and submucosal leiomyoma.  Adenomyosis  was also present in the uterine specimen.  There was a small benign  paratubal cyst and unremarkable ovarian sutures.   The patient's discharge hematocrit was 35.9%.  The patient was tolerating  this well.   The patient was found to be in good condition and ready for discharge on  postoperative day #2.  Her staples were removed prior to discharge.  The  patient will be discharged to home. She will have decreased activities over  the next six weeks and will not lift anything heavier than 15 pounds for the  next three months.  The patient was given a prescription for Percocet one to  two p.o. q.4-6h. p.r.n. She will also take Ibuprofen 600 mg p.o. q.6h.  p.r.n.  The patient will resume her Altace in her usual dosage.  The patient  will follow up in the office in four weeks.  She will call sooner if she  experiences any problems with fever, difficulty voiding, incisional problems  with redness or drainage, increased abdominal pain, heavy vaginal bleeding,  pain in her extremities or shortness of breath or any other concerns.                                               Randye Lobo, M.D.    BES/MEDQ  D:  06/27/2002  T:  06/28/2002  Job:  [id number]

## 2011-03-19 NOTE — H&P (Signed)
Diane [name], Diane [name]                ACCOUNT NO.:  [id number]   MEDICAL RECORD NO.:  [id number]          PATIENT TYPE:  EMS   LOCATION:  MAJO                         FACILITY:  [facility]   PHYSICIAN:  Michaelyn Barter, M.D. DATE OF BIRTH:  12/17/1945   DATE OF ADMISSION:  12/22/2005  DATE OF DISCHARGE:                                HISTORY & PHYSICAL   PRIMARY CARE PHYSICIAN:  Dr. Juline Patch.   CHIEF COMPLAINT:  Left arm pain and chest pain.   HISTORY OF PRESENT ILLNESS:  Diane [name] is a 65 year old female with a past  medical history of hypertension, who states that today while at work she  developed severe left arm pain. This occurred at approximately 1:00 p.m. She  states that the pain becomes as intense as 9 to 10/10. It is characterized  by a stabbing sensation and it is primarily located over the left forearm  anterior surface. The pain comes and goes. She stated that she also  experiences a left sided heavy pressure sensation across her chest. It  appears to come during episodes in which she is experiencing the left arm  pain. The heaviness in her chest also travels to her back, but not her neck.  She states that the left arm pain aggravates her chest pain. Morphine has  helped calm down both her arm and chest pain. She also complained of having  some numbness within her left hands. She has never had the left arm pain  before. She has experienced some nausea, but denies emesis, no fevers or  chills. No cough, no shortness of breath. She did state that she felt dry  earlier and stated that it was similar to feeling dehydrated.   PAST MEDICAL HISTORY:  1.  Irregular heart beat diagnosed approximately 5 years ago.  2.  Hypertension.  3.  Simple endometrial hyperplasia.  4.  Stress incontinence.  5.  Uterine leiomyoma.  6.  History of adenomyosis.  7.  Hypercholesterolemia.   PAST SURGICAL HISTORY:  1.  Total abdominal hysterectomy.  2.  Bilateral salpingo-oophorectomy.  3.   Abdominal Burch procedure.   ALLERGIES:  Codeine produces a rash.   HOME MEDICATIONS:  1.  Coreg 12.5 mg b.i.d.  2.  Micardis 80 mg p.o. b.i.d.  3.  Prevacid.   SOCIAL HISTORY:  Cigarettes, the patient denies. Alcohol, the patient  denies.   FAMILY HISTORY:  Mother:  The patient never knew her mother. Father had a  history of arthritis.   REVIEW OF SYSTEMS:  As per HPI, otherwise all other systems are negative.   PHYSICAL EXAMINATION:  GENERAL:  The patient is cooperative. She  occasionally grabs her left arm, upper region, secondary to pain. She shows  no sign of respiratory distress.  VITAL SIGNS:  Temperature 97.2, blood pressure 140/83, heart rate 74,  respirations 20, O2 sat 97% on room air.  HEENT:  Normocephalic and atraumatic, anicteric. Pupils are equal reactive  to light. Extraocular movements are intact. Oral mucosa is pink. No  exudates. No thrush.  NECK:  No JVD, no lymphadenopathy. Thyroid is not palpable.  Soft, carotid  upstrokes bilaterally.  CARDIAC:  S1, S2 present, regular rate and rhythm, no murmurs, no gallops,  no rubs.  RESPIRATORY:  No crackles or wheezes.  ABDOMEN:  Soft, nontender, nondistended, positive bowel sounds, no  organomegaly.  EXTREMITIES:  Trace distal leg edema.  NEUROLOGICALLY:  The patient is alert and oriented x3. Cranial nerves II-XII  are intact.  MUSCULOSKELETAL:  5/5 upper and lower extremity strength.   LABORATORY DATA:  PH is 7.395, CO2 is 48.2, bicarb 29.5. White blood cell  count 5.6, hemoglobin 13.5, hematocrit 40.4, platelets 260. PT 13.8, INR  1.0. Sodium is 139, potassium 3.9, chloride 108, BUN is 12, creatinine 0.8,  glucose 84. CK MB, POC, is 1.1 and 1.4 respectively. Troponin I, POC, is  less than 0.05. Myoglobin, POC, is 81.9 and 90.4. Chest x-ray reveals  cardiomegaly, no edema, mild pulmonary vascular prominence, tortuous aorta.  EKG reveals normal sinus rhythm, no obvious T waves or ST segment  abnormalities.  Transition is achieved by lead V3.   ASSESSMENT AND PLAN:  Diane [name] is a 65 year old patient with a history of  hypertension, hypercholesterolemia, here for evaluation of left arm pain,  accompanied by chest discomfort.   PLAN:  1.  Chest pain: The patient's chest pain has some atypical features to it.      The etiology of which is likely cardiac versus noncardiac in origin. The      patient does have multiple risk factors for having a cardiac event,      including hypertension, obesity, hypercholesterolemia, menopause      secondary to hysterectomy. Will rule the patient out for ACS via      following her cardiac enzymes  troponin I and CK MB x3 q.8h. apart. IV      nitroglycerin has been initiated in the ER. Will continue this for now,      along with oxygen, p.r.n. morphine and aspirin. Will consider      consultation with cardiology. It may be helpful if it is possible to      obtain the patient's records from her previous stress test and/or      cardiac catheterization. Will check a fasting lipid profile.  2.  Hypertension: The patient's blood pressure is slightly elevated. Will      adjust the dose of her current medications to optimize blood pressure      control.  3.  Left arm pain: The description is somewhat atypical. Again, will rule      the patient out for ACS. Will also order      an x-ray of the left arm and a venous duplex to rule out DVT. Will also      check a D-dimer.  4.  GI prophylaxis:  Will provide Protonix.  5.  DVT prophylaxis:  Will provide Lovenox.      Michaelyn Barter, M.D.  Electronically Signed     OR/MEDQ  D:  12/22/2005  T:  12/22/2005  Job:  [id number]   cc:   Juline Patch, M.D.  Fax: [phone]

## 2011-03-19 NOTE — Procedures (Signed)
[place]. [facility]  Patient:    Diane [name], Diane [name]                         MRN: [id number] Proc. Date: 04/05/01 Adm. Date:  [id number] Attending:  Charna Elizabeth CC:         Lilly Cove, M.D.   Procedure Report  DATE OF BIRTH:  04/08/1946  REFERRING PHYSICIAN:  Lilly Cove, M.D.  PROCEDURE PERFORMED:  Colonoscopy.  ENDOSCOPIST:  Anselmo Rod, M.D.  INSTRUMENT USED:  Olympus video colonoscope.  INDICATIONS FOR PROCEDURE:  Blood in stool in a 65 year old African-American female rule out colonic polyps, masses, hemorrhoids etc.  PREPROCEDURE PREPARATION:  Informed consent was procured from the patient. The patient was fasted for eight hours prior to the procedure and prepped with a bottle of magnesium citrate and a gallon of NuLytely the night prior to the procedure.  PREPROCEDURE PHYSICAL:  The patient had stable vital signs.  Neck supple. Chest clear to auscultation.  S1, S2 regular.  Abdomen soft with normal abdominal bowel sounds.  DESCRIPTION OF PROCEDURE:  The patient was placed in the left lateral decubitus position and sedated with 50 mg of Demerol and 6 mg of Versed intravenously.  Once the patient was adequately sedated and maintained on low-flow oxygen and continuous cardiac monitoring, the Olympus video colonoscope was advanced from the rectum to the cecum with some difficulty. There was a large amount of residual stool in the colon.  Multiple washes were done.  No large masses or polyps were seen.  Very small lesions could have been missed.  No hemorrhoids or AVMs were noticed.  No erosions, or ulcerations ____________ procedure was complete up to the cecum.  IMPRESSION: 1. Unrevealing colonoscopy. 2. Large amount of residual colon.  Small lesions could have been missed.  RECOMMENDATIONS:  Outpatient follow-up is advised.  Further recommendations to be made at that time.   DD:  04/05/01 TD:  04/06/01 Job: [id number]

## 2011-03-19 NOTE — H&P (Signed)
[place]  Patient:    Diane [name], Diane [name]                         MRN: [id number] Adm. Date:  [id number] Attending:  Wilson Singer                         History and Physical  HISTORY OF PRESENT ILLNESS:  This is a 65 year old African-American lady who has a history of hypertension and hypercholesterolemia who now presents with intermittent central dull chest pain associated with shortness of breath.  She started this approximately ten hours ago and has had episodes lasting approximately five minutes each time.  She feels slightly nauseous with the pain and the pain is dull and tight in nature.  It does not seem to radiate. She also has had a headache for the last 2-3 days and this is nonspecific and not associated with loss of consciousness, dizziness or any other neurological symptoms.  PAST MEDICAL HISTORY:  History of bilateral tubal ligation, history of hypertension and hypercholesterolemia.  MEDICATIONS:  Altace 5 mg q.d.  ALLERGIES:  No known drug allergies.  SOCIAL HISTORY:  She is a married lady who lives with her husband.  She does not smoke and does not drink alcohol.  She works in Clinical biochemist at [employer].  FAMILY HISTORY:  No family history of early coronary artery disease.  REVIEW OF SYSTEMS:  Apart from symptoms mentioned above, there are no other symptoms referable to cardiovascular, respiratory, HEENT, gastroenteric, neurological, musculoskeletal, dermatologic, psychiatric systems.  PHYSICAL EXAMINATION:  She is afebrile and hemodynamically stable.  She is currently pain-free having been on a nitroglycerin drip and sublingual nitroglycerin have relieved her pain.  CARDIOVASCULAR EXAM:  Heart sounds are present, normal with no gallop rhythm. Jugular venous pressure was not raised.  Bilateral brachial pulses are present and normal.  RESPIRATORY EXAM:  Lung fields are clear.  ABDOMEN:  Soft, nontender with no  hepatosplenomegaly.  NEUROLOGICAL:  She is alert and oriented with no focal neurological signs.  LABORATORY DATA:  Electrocardiogram shows T wave inversion laterally and this is a clear change from previous electrocardiogram done when she presented with chest pain in February of 2000.  Further lab work is pending.  IMPRESSION: 1. Chest pain.  I think this is likely unstable angina and she will get cardiac serial enzymes, electrocardiograms and she will be on nitroglycerin drip with intravenous heparin also.  We will get cardiology to consult on her as soon as possible and if they think she needs a cardiac catheterization.  2. Hypertension.  Will continue her ACE inhibitor.  3. Hypercholesterolemia.  Further recommendations will depend on progress. DD:  06/02/00 TD:  06/02/00 Job: [id number]

## 2011-03-19 NOTE — Op Note (Signed)
NAME:  Diane [name], Diane [name]                          ACCOUNT NO.:  [id number]   MEDICAL RECORD NO.:  [id number]                   PATIENT TYPE:  INP   LOCATION:  9198                                 FACILITY:  [facility]   PHYSICIAN:  Randye Lobo, M.D.                DATE OF BIRTH:  1946/07/30   DATE OF PROCEDURE:  06/25/2002  DATE OF DISCHARGE:                                 OPERATIVE REPORT   PREOPERATIVE DIAGNOSES:  1. Simple endometrial hyperplasia without atypia.  2. Potential genuine stress incontinence.   POSTOPERATIVE DIAGNOSES:  1. Simple endometrial hyperplasia without atypia.  2. Potential genuine stress incontinence.   PROCEDURE:  Total abdominal hysterectomy with bilateral salpingo-  oophorectomy, abdominal Burch procedure, cystoscopy, suprapubic catheter  placement.   SURGEON:  Randye Lobo, M.D.   ASSISTANT:  Malva Limes, MD   ANESTHESIA:  General endotracheal.   IV FLUIDS:  2200 cc Ringer's lactate.   ESTIMATED BLOOD LOSS:  150 cc.   URINE OUTPUT:  250 cc, blood tinged after cystoscopy.   COMPLICATIONS:  None.   INDICATIONS FOR PROCEDURE:  The patient was a 65 year old gravida 3, para 3  African-American female status post bilateral tubal ligation who presented  to the office with postmenopausal bleeding and on endometrial biopsy was  found to have simple endometrial hyperplasia.  Pelvic ultrasound documented  endometrial stripe measuring 10.4 mm in the largest thickness.  In addition  to the above symptoms, the patient reported urinary incontinence with  coughing and sneezing which was problematic for her.  The patient did  undergo simple office cystometrics which did not document the presence of  stress incontinence.  The patient's bladder could be filled only to 350 cc  at which time she developed a strong urge to void and the bladder Foley  needed to be discontinued at this time.  The patient was given a diagnosis  of simple endometrial hyperplasia and  potential genuine stress incontinence.  The patient chose to proceed with surgical treatment of the above after the  risks, benefits, alternatives were discussed with her.   FINDINGS:  At the time of laparotomy the patient was noted to have a normal  uterus and ovaries.  The fallopian tubes were consistent with a prior  bilateral tubal ligation and were otherwise normal.  The patient had a  normal appendix.   Cystoscopy documented the absence of sutures in the urethra and bladder.  The trigone and the bladder dome were noted to be normal and the bladder was  visualized throughout 360 degrees.  The ureters were noted to be patent  bilaterally after the injection of indigo carmine dye intravenously.  The  urethra was noted to have an abrasion along the mucosa at the 6 o'clock  position after performing the cystoscopy.   SPECIMENS:  The uterus, tubes, and ovaries were sent to pathology.   PROCEDURE:  The patient was  taken to the operating room after she was  properly identified.  The patient did receive Ancef 1 g intravenously for  antibiotic prophylaxis and she received a combination of subcutaneous  heparin, PAS stockings, and ted hose for DVT prophylaxis.   In the operating room the patient was placed on the operating room table and  received general endotracheal anesthesia.  She was then placed in the dorsal  lithotomy position at which time the abdomen and the vagina were sterilely  prepped and draped.  A 30 cc Foley catheter was then sterilely placed inside  the bladder.   The procedure began by creating a Pfannenstiel sharply with a scalpel.  The  incision as carried down to the rectus fascia using monopolar cautery.  The  fascia was then scored in the midline with a scalpel and the incision was  carried out bilaterally using sharp dissection with the scissors.  The  rectus muscles were dissected off to the underlying fascia, again using  sharp dissection.  The rectus muscles  were then divided in the midline and  the parietoperitoneum was identified and grasped with a snap clamp.  It was  ultimately entered bluntly.  The peritoneal incision was extended cranially  and caudally with the Metzenbaum scissors.   A self retaining retractor was then placed in the peritoneal cavity and the  bladder was packed into the upper abdomen.  An examination of the pelvic and  abdominal organs was performed and the findings were as noted above.   The hysterectomy was performed next.  Two long Kelly clamps were placed  across the adnexal structures bilaterally.  The round ligament on the  patient's right-hand side was identified and suture ligated with a  transfixing suture of 0 Vicryl.  The broad ligament was then opened sharply  with a combination of monopolar cautery and the Metzenbaum scissors.  The  dissection was carried across the anterior leaf of the broad ligament to  assist in performing the vesicouterine dissection on this side.  The ureter  was next identified along the right pelvic side wall and a window was  created through the posterior leaf of the broad ligament on the same side.  The infundibulopelvic ligament was then clamped and was sharply divided.  It  was ligated with a transfixing suture of 0 Vicryl followed by a free tie of  the same.  Hemostasis was excellent.  The uterine artery on this side was  then skeletonized and clamped with a Heaney clamp.  The same procedure that  was performed on the patient's right-hand side was then repeated on the left-  hand side.  Again, the ureter was identified on the patient's left pelvic  side wall prior to isolating the infundibulopelvic ligament.  The bladder  was dissected off of the lower uterine segment using sharp dissection with  the Metzenbaum scissors and the left uterine artery was skeletonized, at  which time it was clamped with a Heaney clamp.  Each of the uterine arteries were then sharply divided and  suture ligated with a suture of 0 Vicryl  bilaterally.  The remainder of the cardinal and uterosacral ligaments were  then clamped, sharply divided, and ligated with transfixing sutures of 0  Vicryl.  A curved Heaney clamp was then placed across the top of the vagina  at the base of the cervix on the right-hand side and the vagina was entered.  The top of the vagina was circumscribed with a Satinski scissors and the  specimen was removed and sent to pathology.  Angle sutures were then placed.  Hemostasis was noted to be good at this point.  The remainder of the vagina  was closed by placing interrupted figure-of-eight sutures.   The pelvis was then irrigated and a small amount of bleeding was appreciated  along the peritoneum of the vaginal cuff on the patient's left-hand side  just posterior to the angle suture.  This responded well to monopolar  cautery.  Hemostasis was otherwise excellent.   The Burch procedure was performed next at this time.  A left hand was placed  inside the vagina and the paravaginal dissection was performed bluntly using  a combination of monopolar cautery and blunt dissection down to the level of  the __________  bilaterally.  The Cooper's ligaments were then also  identified and adipose tissue overlying them was dissected away, again using  the combination of blunt dissection and monopolar cautery.  A series of a  total of four Burch sutures were placed with two on each side of the  urethra.  The sutures on the left-hand side were placed first.  A figure-of-  eight suture of 0 Ethibond was placed in the paravaginal tissue at the level  of the mid urethra on the patient's left-hand side.  A 0 Ethibond suture was  used to come through the paravaginal tissue at the level of the  urethrovesical junction and a single __________ suture just lateral to this.  On the patient's right-hand side a figure-of-eight suture was placed in the  paravaginal tissue at both the  level of the mid urethra and lateral to it  and at the level of the urethrovesical junction and just lateral to the  suture at the level of the mid urethra.  Each arm of the sutures were then  brought up through the Cooper's ligament on the ipsilateral side and the  knots were tied above the Cooper's ligaments while a hand elevated the  tissue from below.  This provided excellent elevation of the paravaginal  tissue __________  was created underneath the urethra.   Cystoscopy was performed next after the Foley catheter was removed from the  urethra.  Please refer to the findings as noted above.  A 16 French Foley  catheter was then replaced inside the urinary bladder after a suprapubic  catheter had been placed under direct visualization of the laparoscope.  The  suprapubic catheter was secured to the skin using 2-0 Ethibond suture.   The pelvis was found to be hemostatic and the self retaining retractors and the lap pads were removed.  The peritoneum was closed with a running suture  of 3-0 plain.  The rectus muscles were brought together in the midline with  a single figure-of-eight suture of 0 Vicryl.  The fascia was closed with a  running suture of 0 Vicryl and the subcutaneous tissue was then examined.  The tissue was irrigated and small bleeding vessels were cauterized with  monopolar cautery.  The skin was closed with staples followed by a sterile  bandage.  Both the suprapubic catheter and the Foley catheter were left to  gravity drainage.  This concluded the patient's procedure.  There were no  complications.  All needle, instrument, and sponge counts were correct.   The patient was awakened and extubated and escorted to the recovery room in  good condition.  Randye Lobo, M.D.    BES/MEDQ  D:  06/25/2002  T:  06/25/2002  Job:  [id number]
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
## 2011-09-24 ENCOUNTER — Other Ambulatory Visit: Payer: Self-pay | Admitting: Internal Medicine

## 2011-09-24 DIAGNOSIS — R1011 Right upper quadrant pain: Secondary | ICD-10-CM

## 2011-09-29 ENCOUNTER — Ambulatory Visit
Admission: RE | Admit: 2011-09-29 | Discharge: 2011-09-29 | Disposition: A | Discharge status: Home / Self Care | Payer: Medicare Other | Source: Ambulatory Visit | Attending: Internal Medicine | Admitting: Internal Medicine

## 2011-09-29 DIAGNOSIS — R1011 Right upper quadrant pain: Secondary | ICD-10-CM

## 2011-09-29 IMAGING — CT CT ABD-PELV W/ CM
3 of 5 series · 13 of 36 positions shown, 19 images · IV contrast (READICAT/WATER & [ID] OMNI 300)
Comparison: CT abdomen pelvis of 03/09/2008

CLINICAL DATA: Right upper quadrant pain, laparoscopic
cholecystectomy in 7050 with persistent pain

CT ABDOMEN AND PELVIS WITH CONTRAST
TECHNIQUE: Multidetector CT imaging of the abdomen and pelvis was
performed following the standard protocol during bolus
administration of intravenous contrast.
Contrast: 125mL OMNIPAQUE IOHEXOL 300 MG/ML IV SOLN

[Series 3: abd/pelvis with · axial · 0.82mm/px · z∈[-364,-29]mm · 7 of 91 slices shown, 12 images]
[im 12/91  soft-tissue]
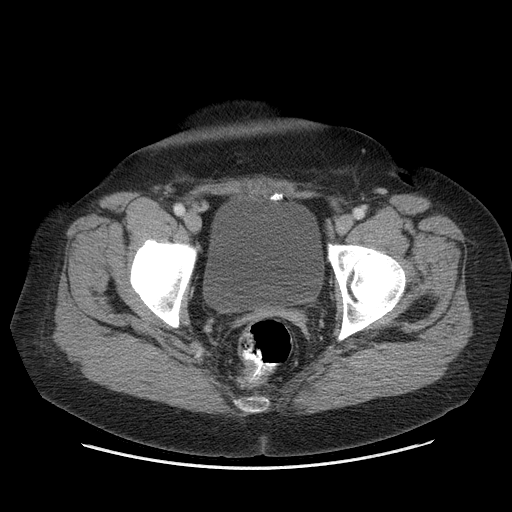
[im 12/91  bone]
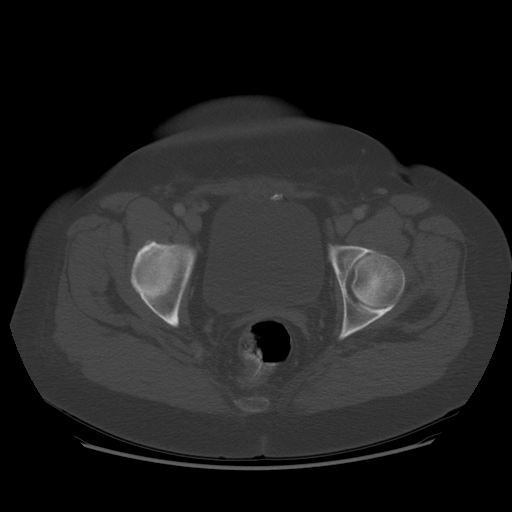
[im 23/91  soft-tissue]
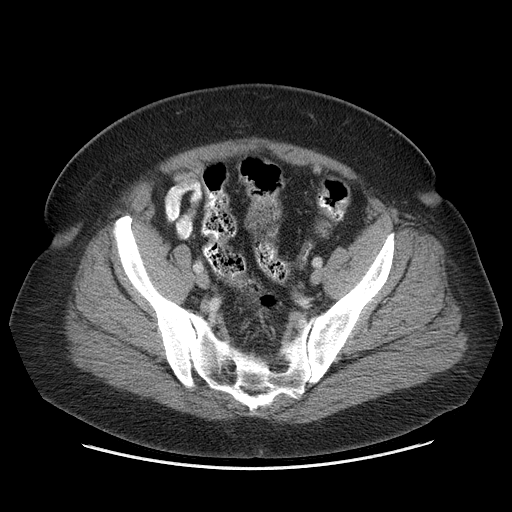
[im 34/91  soft-tissue]
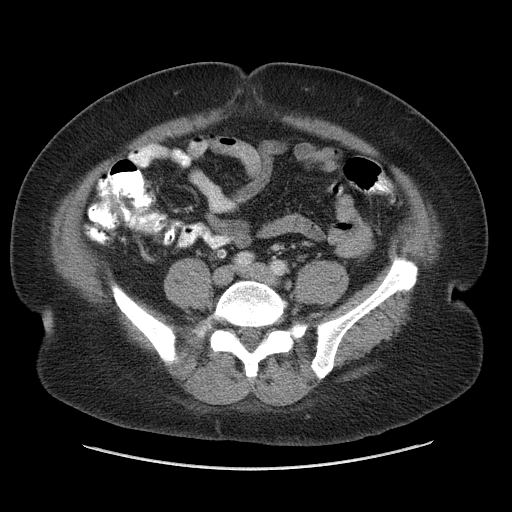
[im 46/91  soft-tissue]
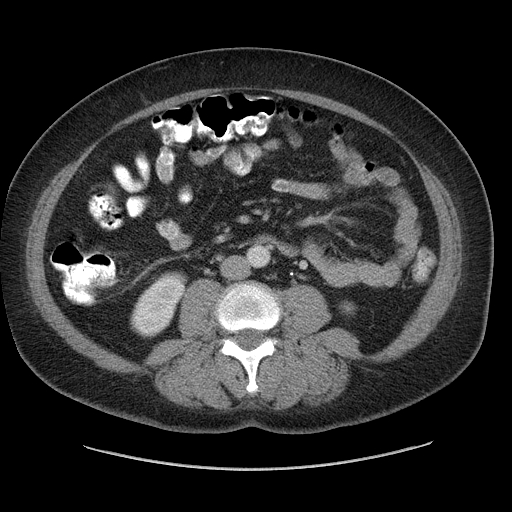
[im 46/91  lung]
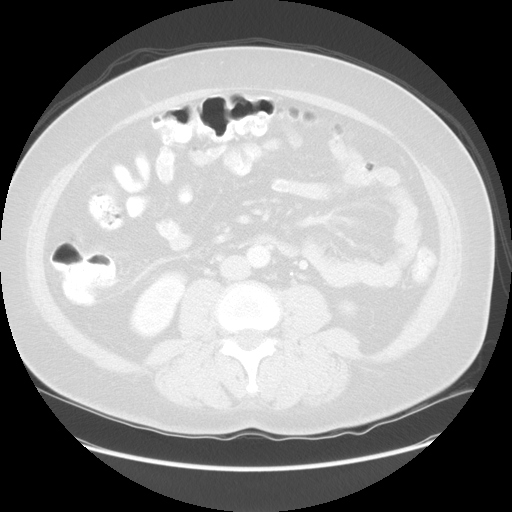
[im 57/91  soft-tissue]
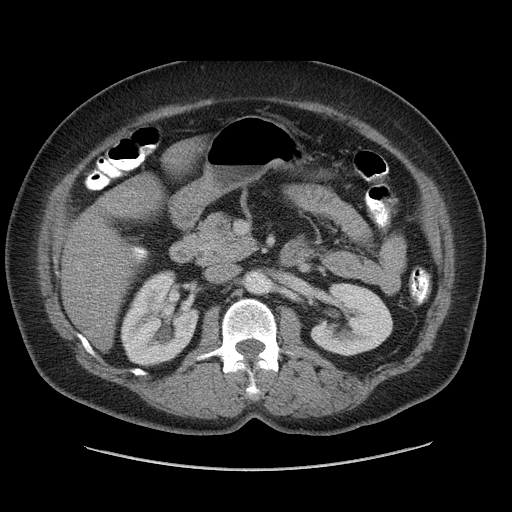
[im 57/91  lung]
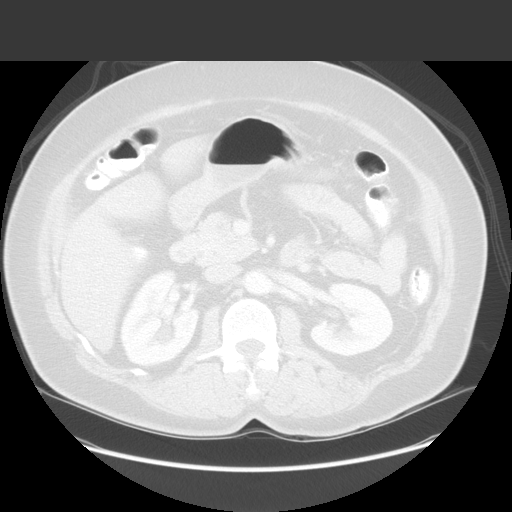
[im 68/91  soft-tissue]
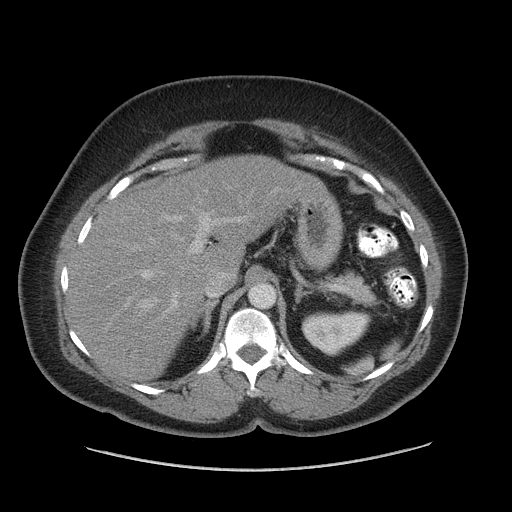
[im 68/91  lung]
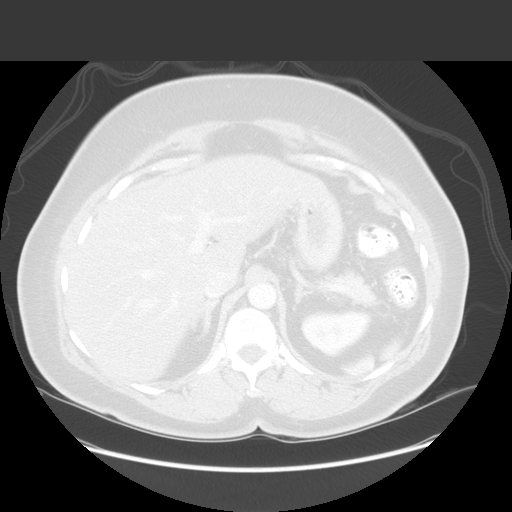
[im 79/91  soft-tissue]
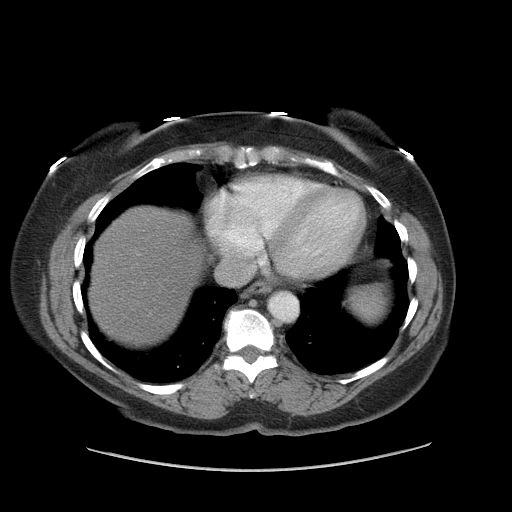
[im 79/91  lung]
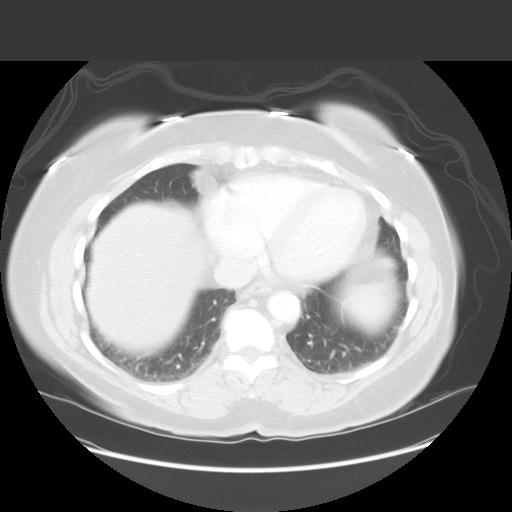

[Series 601: coronal body · coronal · 0.97mm/px · 1 of 141 slices shown, 2 images]
[im 47/141  soft-tissue]
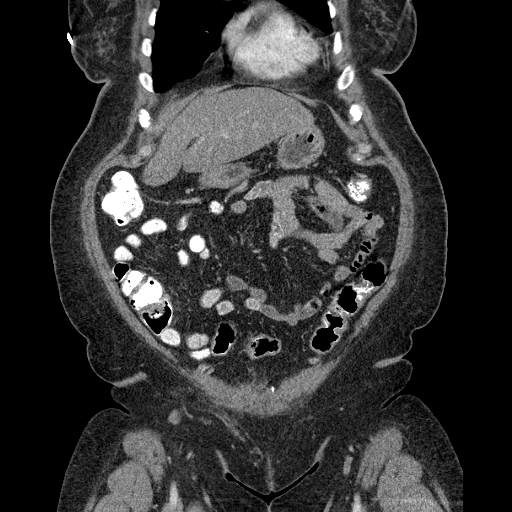
[im 47/141  bone]
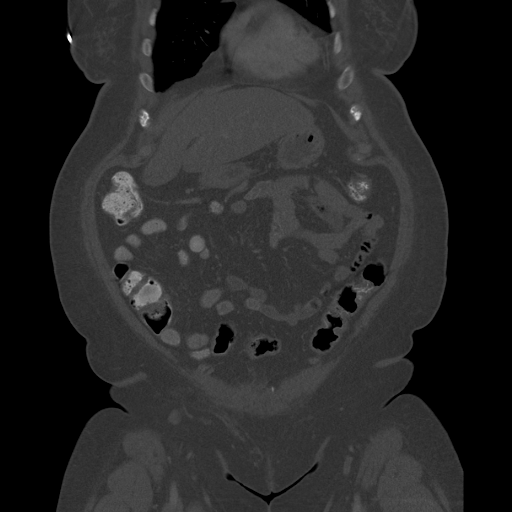

[Series 602: sagittal body · sagittal · 0.97mm/px · 5 of 169 slices shown]
[im 11/169  soft-tissue]
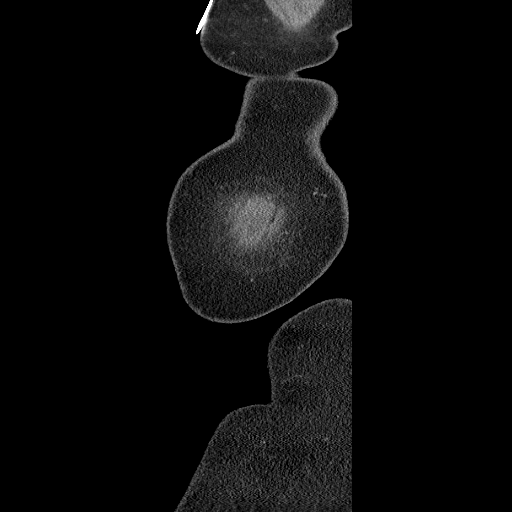
[im 32/169  soft-tissue]
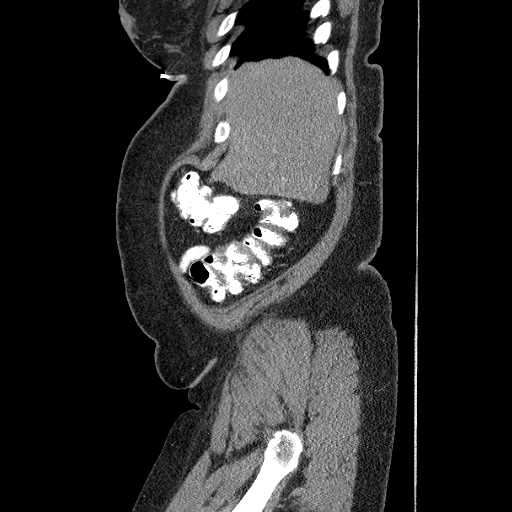
[im 53/169  soft-tissue]
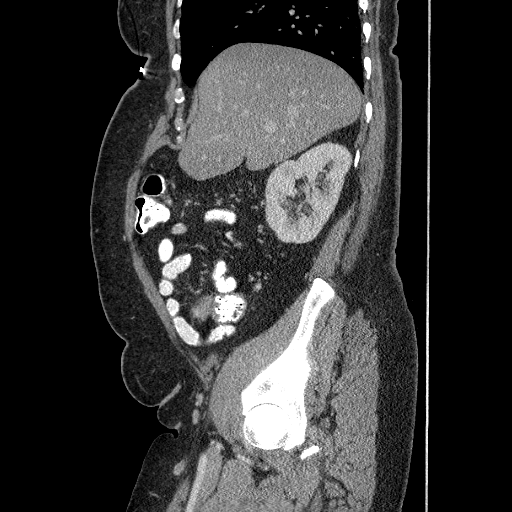
[im 74/169  soft-tissue]
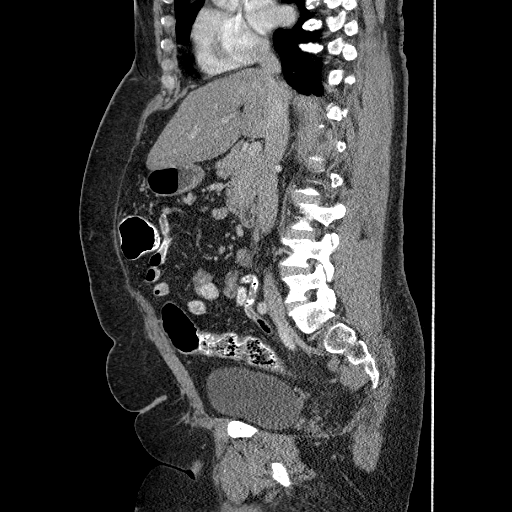
[im 95/169  soft-tissue]
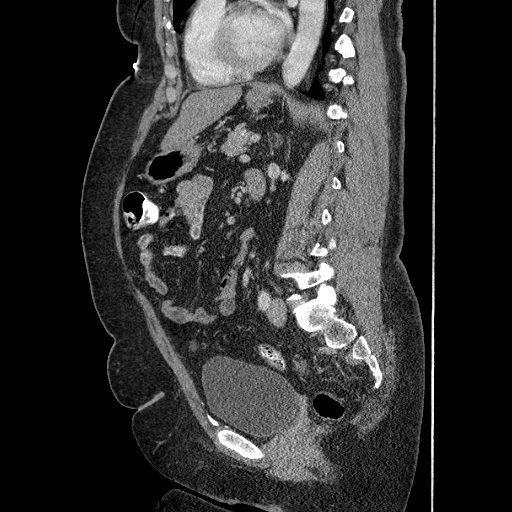

[13 of 36 positions shown; findings below may reference images not displayed]

FINDINGS: The lung bases are clear.  The liver is diffusely low in
attenuation consistent with fatty infiltration.  No focal
abnormality is seen.  No ductal dilatation is noted.  Surgical
clips are present from prior cholecystectomy.  The pancreas is
normal in size and the pancreatic duct is not dilated.  The adrenal
glands and spleen are unremarkable.  Small splenules or nodes are
present more medially.  The stomach is not well distended but no
abnormality is seen.  The kidneys enhance with no evidence of
calculus or hydronephrosis. On delayed images, the pelvocaliceal
systems are unremarkable.  The ureters are normal in caliber.  The
abdominal aorta appears normal.  No adenopathy is seen.

The appendix is visualized in the right lower quadrant and fills
with contrast and air.  There is no evidence of appendicitis.  The
terminal ileum is well visualized with no abnormality noted.  The
urinary bladder is unremarkable.  The uterus has previously been
resected.  No adnexal lesion is seen.  No abnormality of the colon
is seen.  No bony abnormality is seen.
IMPRESSION: 1.  No explanation for the patient's pain is seen.
2.  No renal calculi.  No hydronephrosis.
3.  The appendix and terminal ileum appear normal.

## 2011-09-29 MED ORDER — IOHEXOL 300 MG/ML  SOLN
125.0000 mL | Freq: Once | INTRAMUSCULAR | Status: AC | PRN
  Administered 2011-09-29: 125 mL via INTRAVENOUS

## 2011-11-11 DIAGNOSIS — R112 Nausea with vomiting, unspecified: Secondary | ICD-10-CM | POA: Diagnosis not present

## 2011-11-12 DIAGNOSIS — H11159 Pinguecula, unspecified eye: Secondary | ICD-10-CM | POA: Diagnosis not present

## 2011-11-12 DIAGNOSIS — H2510 Age-related nuclear cataract, unspecified eye: Secondary | ICD-10-CM | POA: Diagnosis not present

## 2011-11-12 DIAGNOSIS — H26499 Other secondary cataract, unspecified eye: Secondary | ICD-10-CM | POA: Diagnosis not present

## 2011-12-11 ENCOUNTER — Encounter (HOSPITAL_BASED_OUTPATIENT_CLINIC_OR_DEPARTMENT_OTHER): Payer: Self-pay | Admitting: Emergency Medicine

## 2011-12-11 ENCOUNTER — Other Ambulatory Visit: Payer: Self-pay

## 2011-12-11 ENCOUNTER — Emergency Department (HOSPITAL_BASED_OUTPATIENT_CLINIC_OR_DEPARTMENT_OTHER)
Admission: EM | Admit: 2011-12-11 | Discharge: 2011-12-11 | Disposition: A | Discharge status: Home / Self Care | Payer: Medicare Other | Attending: Emergency Medicine | Admitting: Emergency Medicine

## 2011-12-11 ENCOUNTER — Emergency Department (INDEPENDENT_AMBULATORY_CARE_PROVIDER_SITE_OTHER): Payer: Medicare Other

## 2011-12-11 DIAGNOSIS — J069 Acute upper respiratory infection, unspecified: Secondary | ICD-10-CM

## 2011-12-11 DIAGNOSIS — J329 Chronic sinusitis, unspecified: Secondary | ICD-10-CM | POA: Insufficient documentation

## 2011-12-11 DIAGNOSIS — I10 Essential (primary) hypertension: Secondary | ICD-10-CM | POA: Diagnosis not present

## 2011-12-11 DIAGNOSIS — R5381 Other malaise: Secondary | ICD-10-CM | POA: Diagnosis not present

## 2011-12-11 DIAGNOSIS — E119 Type 2 diabetes mellitus without complications: Secondary | ICD-10-CM | POA: Diagnosis not present

## 2011-12-11 DIAGNOSIS — J019 Acute sinusitis, unspecified: Secondary | ICD-10-CM | POA: Diagnosis not present

## 2011-12-11 HISTORY — DX: Essential (primary) hypertension: I10

## 2011-12-11 IMAGING — CR DG CHEST 2V
2 series · 2 of 2 positions shown · non-contrast
Comparison: 12/23/2009

CLINICAL DATA: URI symptoms

CHEST - 2 VIEW

[w chest pa]
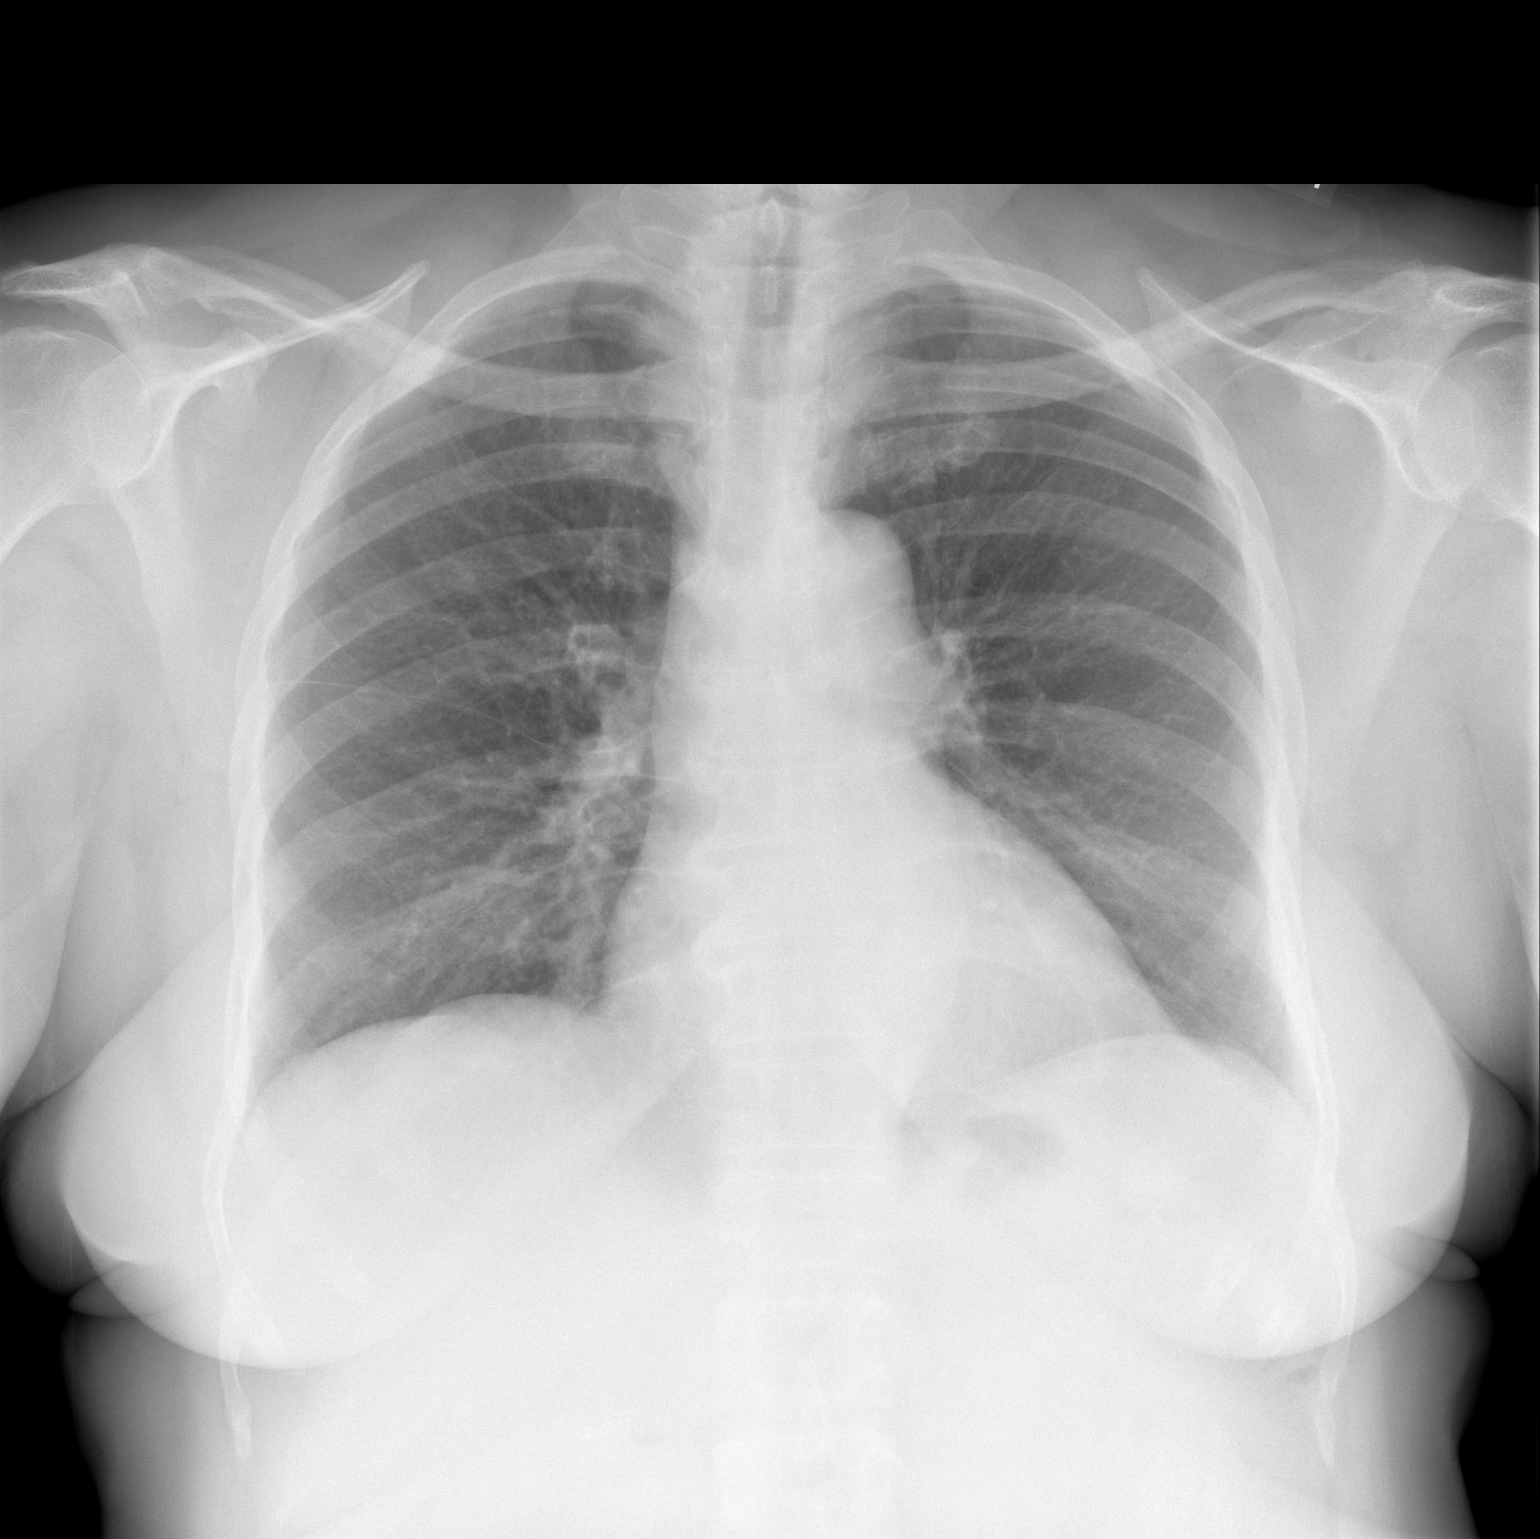

[w chest lat]
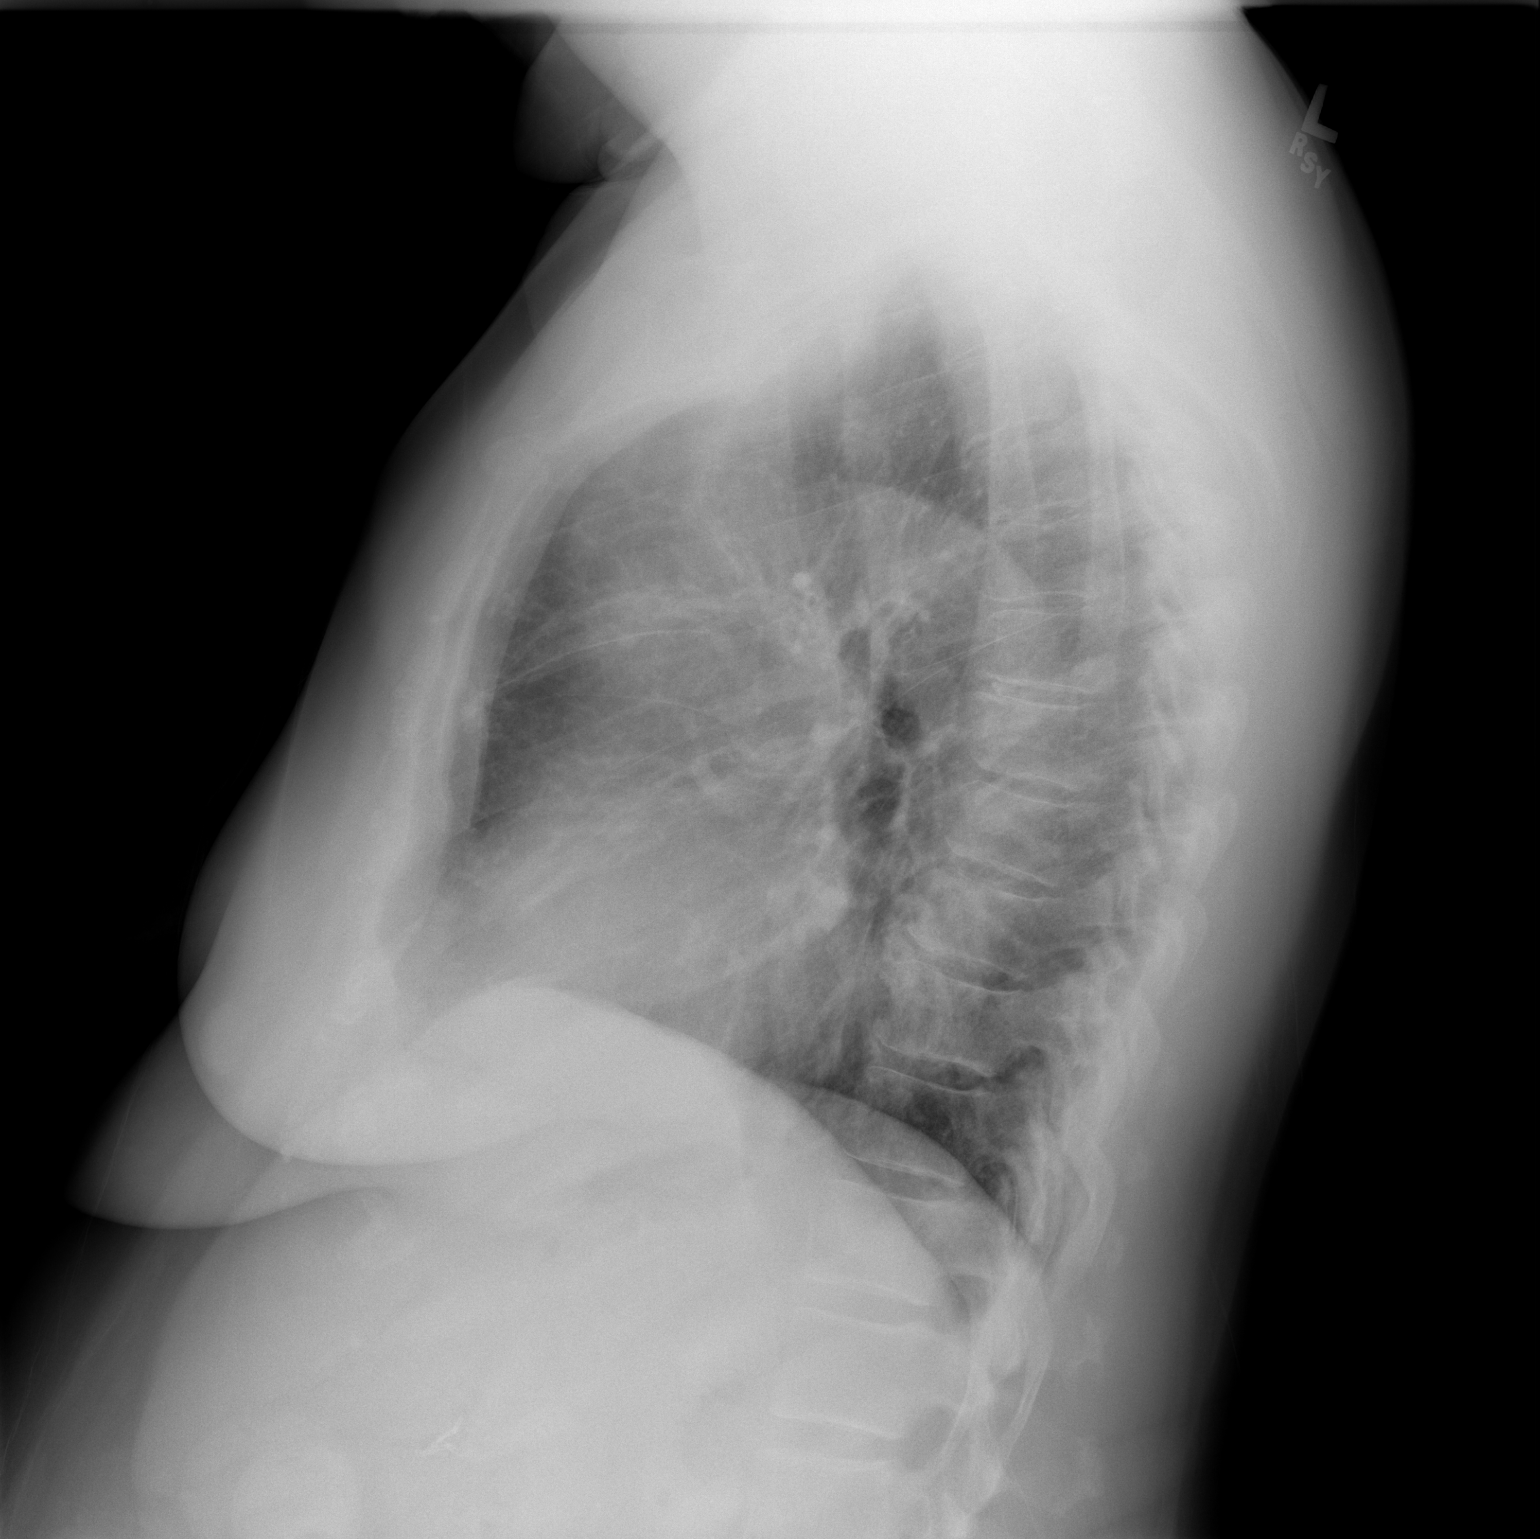

[2 of 2 positions shown; findings below may reference images not displayed]

FINDINGS: Lungs are clear. No pleural effusion or pneumothorax.

Cardiomediastinal silhouette is within normal limits.

Mild degenerative changes of the visualized thoracolumbar spine.
IMPRESSION: No evidence of acute cardiopulmonary disease.

## 2011-12-11 MED ORDER — AMOXICILLIN 500 MG PO CAPS: 1000.0000 mg | ORAL_CAPSULE | Freq: Three times a day (TID) | ORAL | Status: AC

## 2011-12-11 MED ORDER — PSEUDOEPHEDRINE HCL 60 MG PO TABS: 60.0000 mg | ORAL_TABLET | ORAL | Status: AC | PRN

## 2011-12-11 NOTE — ED Notes (Signed)
Pt c/o URI sx x 1 week; reports coughing up yellow sputum

## 2011-12-11 NOTE — ED Provider Notes (Signed)
History     CSN: [id number]  Arrival date & time 12/11/11  1259   First MD Initiated Contact with Patient 12/11/11 1312      Chief Complaint  Patient presents with  . URI    (Consider location/radiation/quality/duration/timing/severity/associated sxs/prior treatment) HPI Comments: Patient presents with one week of cough, congestion, sinus pressure and subjective fevers. She has some chest pain with coughing but no shortness of breath and no chest pain exertion. She denies any nausea, vomiting, abdominal pain. She's had sick contacts at home. She's been using camphor oral and Vicks VapoRub without relief. Negative flu shot this year. She has a history of diabetes and hypertension. She denies any difficulty breathing or swallowing.  The history is provided by the patient.    Past Medical History  Diagnosis Date  . Hypertension   . Diabetes mellitus     Past Surgical History  Procedure Date  . Abdominal hysterectomy   . Cholecystectomy   . Hernia repair     No family history on file.  History  Substance Use Topics  . Smoking status: Never Smoker   . Smokeless tobacco: Not on file  . Alcohol Use: No    OB History    Grav Para Term Preterm Abortions TAB SAB Ect Mult Living                  Review of Systems  Constitutional: Positive for chills and fatigue. Negative for fever, activity change and appetite change.  HENT: Positive for congestion, sore throat, rhinorrhea and postnasal drip.   Respiratory: Positive for cough. Negative for chest tightness and shortness of breath.   Cardiovascular: Negative for chest pain.  Gastrointestinal: Negative for nausea, vomiting and abdominal pain.  Genitourinary: Negative for dysuria, hematuria, vaginal bleeding and vaginal discharge.  Musculoskeletal: Positive for myalgias and arthralgias. Negative for back pain.  Skin: Negative for rash.  Neurological: Negative for weakness and headaches.    Allergies  Keflex and  Codeine  Home Medications   Current Outpatient Rx  Name Route Sig Dispense Refill  . METFORMIN HCL 500 MG PO TABS Oral Take 500 mg by mouth 2 (two) times daily with a meal.    . TWYNSTA PO Oral Take by mouth daily.    . AMOXICILLIN 500 MG PO CAPS Oral Take 2 capsules (1,000 mg total) by mouth 3 (three) times daily. 60 capsule 0  . PSEUDOEPHEDRINE HCL 60 MG PO TABS Oral Take 1 tablet (60 mg total) by mouth every 4 (four) hours as needed for congestion. 30 tablet 0    BP 173/92  Pulse 103  Temp(Src) 98.4 F (36.9 C) (Oral)  Resp 20  SpO2 96%  Physical Exam  Constitutional: She is oriented to person, place, and time. She appears well-developed and well-nourished. No distress.  HENT:  Head: Normocephalic and atraumatic.  Mouth/Throat: Oropharynx is clear and moist. No oropharyngeal exudate.  Eyes: Conjunctivae are normal. Pupils are equal, round, and reactive to light.  Neck: Normal range of motion. Neck supple.  Cardiovascular: Normal rate, regular rhythm and normal heart sounds.   Pulmonary/Chest: Effort normal and breath sounds normal. No respiratory distress. She has no wheezes.  Abdominal: Soft. There is no tenderness. There is no rebound and no guarding.  Musculoskeletal: Normal range of motion. She exhibits no edema and no tenderness.  Neurological: She is alert and oriented to person, place, and time. No cranial nerve deficit.  Skin: Skin is warm.    ED Course  Procedures (including  critical care time)   Labs Reviewed  RAPID STREP SCREEN   Dg Chest 2 View  12/11/2011  *RADIOLOGY REPORT*  Clinical Data: URI symptoms  CHEST - 2 VIEW  Comparison: 12/23/2009  Findings: Lungs are clear. No pleural effusion or pneumothorax.  Cardiomediastinal silhouette is within normal limits.  Mild degenerative changes of the visualized thoracolumbar spine.  IMPRESSION: No evidence of acute cardiopulmonary disease.  Original Report Authenticated By: Charline Bills, M.D.     1. Sinusitis        MDM  URI symptoms with cough, congestion, sinus pressure. Nontoxic on exam.  Lungs clear.    Date: 12/11/2011  Rate: 94  Rhythm: normal sinus rhythm  QRS Axis: normal  Intervals: QT prolonged  ST/T Wave abnormalities: nonspecific T wave changes  Conduction Disutrbances:none  Narrative Interpretation: T-wave flattening lateral leads  Old EKG Reviewed: unchanged       Glynn Octave, MD 12/11/11 [phone]

## 2011-12-16 DIAGNOSIS — H02839 Dermatochalasis of unspecified eye, unspecified eyelid: Secondary | ICD-10-CM | POA: Diagnosis not present

## 2011-12-16 DIAGNOSIS — L908 Other atrophic disorders of skin: Secondary | ICD-10-CM | POA: Diagnosis not present

## 2011-12-16 DIAGNOSIS — H02429 Myogenic ptosis of unspecified eyelid: Secondary | ICD-10-CM | POA: Diagnosis not present

## 2011-12-16 DIAGNOSIS — H5340 Unspecified visual field defects: Secondary | ICD-10-CM | POA: Diagnosis not present

## 2012-02-02 DIAGNOSIS — I10 Essential (primary) hypertension: Secondary | ICD-10-CM | POA: Diagnosis not present

## 2012-02-02 DIAGNOSIS — G589 Mononeuropathy, unspecified: Secondary | ICD-10-CM | POA: Diagnosis not present

## 2012-02-07 ENCOUNTER — Other Ambulatory Visit (HOSPITAL_COMMUNITY): Payer: Self-pay | Admitting: Internal Medicine

## 2012-02-07 DIAGNOSIS — G589 Mononeuropathy, unspecified: Secondary | ICD-10-CM | POA: Diagnosis not present

## 2012-02-07 DIAGNOSIS — G1229 Other motor neuron disease: Secondary | ICD-10-CM | POA: Diagnosis not present

## 2012-02-07 DIAGNOSIS — Z1231 Encounter for screening mammogram for malignant neoplasm of breast: Secondary | ICD-10-CM

## 2012-02-16 DIAGNOSIS — H02429 Myogenic ptosis of unspecified eyelid: Secondary | ICD-10-CM | POA: Diagnosis not present

## 2012-02-16 DIAGNOSIS — H5340 Unspecified visual field defects: Secondary | ICD-10-CM | POA: Diagnosis not present

## 2012-02-16 DIAGNOSIS — H02839 Dermatochalasis of unspecified eye, unspecified eyelid: Secondary | ICD-10-CM | POA: Diagnosis not present

## 2012-02-16 DIAGNOSIS — H02409 Unspecified ptosis of unspecified eyelid: Secondary | ICD-10-CM | POA: Diagnosis not present

## 2012-02-16 DIAGNOSIS — H539 Unspecified visual disturbance: Secondary | ICD-10-CM | POA: Diagnosis not present

## 2012-03-02 ENCOUNTER — Ambulatory Visit (HOSPITAL_COMMUNITY)
Admission: RE | Admit: 2012-03-02 | Discharge: 2012-03-02 | Disposition: A | Discharge status: Home / Self Care | Payer: Medicare Other | Source: Ambulatory Visit | Attending: Internal Medicine | Admitting: Internal Medicine

## 2012-03-02 ENCOUNTER — Other Ambulatory Visit (HOSPITAL_COMMUNITY): Payer: Self-pay | Admitting: Internal Medicine

## 2012-03-02 DIAGNOSIS — Z1231 Encounter for screening mammogram for malignant neoplasm of breast: Secondary | ICD-10-CM | POA: Insufficient documentation

## 2012-05-01 DIAGNOSIS — L600 Ingrowing nail: Secondary | ICD-10-CM | POA: Diagnosis not present

## 2012-05-01 DIAGNOSIS — L03039 Cellulitis of unspecified toe: Secondary | ICD-10-CM | POA: Diagnosis not present

## 2012-05-08 DIAGNOSIS — M79609 Pain in unspecified limb: Secondary | ICD-10-CM | POA: Diagnosis not present

## 2012-06-03 DIAGNOSIS — L03039 Cellulitis of unspecified toe: Secondary | ICD-10-CM | POA: Diagnosis not present

## 2012-06-03 DIAGNOSIS — L851 Acquired keratosis [keratoderma] palmaris et plantaris: Secondary | ICD-10-CM | POA: Diagnosis not present

## 2012-06-03 DIAGNOSIS — L600 Ingrowing nail: Secondary | ICD-10-CM | POA: Diagnosis not present

## 2012-06-03 DIAGNOSIS — M7750 Other enthesopathy of unspecified foot: Secondary | ICD-10-CM | POA: Diagnosis not present

## 2012-06-21 DIAGNOSIS — R7989 Other specified abnormal findings of blood chemistry: Secondary | ICD-10-CM | POA: Diagnosis not present

## 2012-06-21 DIAGNOSIS — E119 Type 2 diabetes mellitus without complications: Secondary | ICD-10-CM | POA: Diagnosis not present

## 2012-06-21 DIAGNOSIS — R42 Dizziness and giddiness: Secondary | ICD-10-CM | POA: Diagnosis not present

## 2012-06-21 DIAGNOSIS — R9431 Abnormal electrocardiogram [ECG] [EKG]: Secondary | ICD-10-CM | POA: Diagnosis not present

## 2012-06-21 DIAGNOSIS — I10 Essential (primary) hypertension: Secondary | ICD-10-CM | POA: Diagnosis not present

## 2012-06-21 DIAGNOSIS — R51 Headache: Secondary | ICD-10-CM | POA: Diagnosis not present

## 2012-08-01 DIAGNOSIS — I10 Essential (primary) hypertension: Secondary | ICD-10-CM | POA: Diagnosis not present

## 2012-08-01 DIAGNOSIS — E119 Type 2 diabetes mellitus without complications: Secondary | ICD-10-CM | POA: Diagnosis not present

## 2012-08-01 DIAGNOSIS — Z23 Encounter for immunization: Secondary | ICD-10-CM | POA: Diagnosis not present

## 2012-08-09 DIAGNOSIS — I739 Peripheral vascular disease, unspecified: Secondary | ICD-10-CM | POA: Diagnosis not present

## 2012-08-09 DIAGNOSIS — R0989 Other specified symptoms and signs involving the circulatory and respiratory systems: Secondary | ICD-10-CM | POA: Diagnosis not present

## 2012-08-09 DIAGNOSIS — G450 Vertebro-basilar artery syndrome: Secondary | ICD-10-CM | POA: Diagnosis not present

## 2012-08-15 DIAGNOSIS — K625 Hemorrhage of anus and rectum: Secondary | ICD-10-CM | POA: Diagnosis not present

## 2012-08-15 DIAGNOSIS — K219 Gastro-esophageal reflux disease without esophagitis: Secondary | ICD-10-CM | POA: Diagnosis not present

## 2012-08-15 DIAGNOSIS — Z86010 Personal history of colonic polyps: Secondary | ICD-10-CM | POA: Diagnosis not present

## 2012-08-23 DIAGNOSIS — K649 Unspecified hemorrhoids: Secondary | ICD-10-CM | POA: Diagnosis not present

## 2012-08-23 DIAGNOSIS — K648 Other hemorrhoids: Secondary | ICD-10-CM | POA: Diagnosis not present

## 2012-08-23 DIAGNOSIS — K625 Hemorrhage of anus and rectum: Secondary | ICD-10-CM | POA: Diagnosis not present

## 2012-08-23 DIAGNOSIS — D126 Benign neoplasm of colon, unspecified: Secondary | ICD-10-CM | POA: Diagnosis not present

## 2012-08-23 DIAGNOSIS — E669 Obesity, unspecified: Secondary | ICD-10-CM | POA: Diagnosis not present

## 2012-09-14 DIAGNOSIS — M79609 Pain in unspecified limb: Secondary | ICD-10-CM | POA: Diagnosis not present

## 2012-09-14 DIAGNOSIS — K648 Other hemorrhoids: Secondary | ICD-10-CM | POA: Diagnosis not present

## 2012-12-20 DIAGNOSIS — R7309 Other abnormal glucose: Secondary | ICD-10-CM | POA: Diagnosis not present

## 2012-12-20 DIAGNOSIS — Z1331 Encounter for screening for depression: Secondary | ICD-10-CM | POA: Diagnosis not present

## 2012-12-20 DIAGNOSIS — E119 Type 2 diabetes mellitus without complications: Secondary | ICD-10-CM | POA: Diagnosis not present

## 2013-02-14 ENCOUNTER — Other Ambulatory Visit (HOSPITAL_COMMUNITY): Payer: Self-pay | Admitting: Internal Medicine

## 2013-02-14 DIAGNOSIS — L0390 Cellulitis, unspecified: Secondary | ICD-10-CM | POA: Diagnosis not present

## 2013-02-14 DIAGNOSIS — L0291 Cutaneous abscess, unspecified: Secondary | ICD-10-CM | POA: Diagnosis not present

## 2013-02-14 DIAGNOSIS — Z0000 Encounter for general adult medical examination without abnormal findings: Secondary | ICD-10-CM

## 2013-02-21 DIAGNOSIS — R32 Unspecified urinary incontinence: Secondary | ICD-10-CM | POA: Diagnosis not present

## 2013-02-21 DIAGNOSIS — M79609 Pain in unspecified limb: Secondary | ICD-10-CM | POA: Diagnosis not present

## 2013-02-21 DIAGNOSIS — H547 Unspecified visual loss: Secondary | ICD-10-CM | POA: Diagnosis not present

## 2013-03-02 DIAGNOSIS — H0230 Blepharochalasis unspecified eye, unspecified eyelid: Secondary | ICD-10-CM | POA: Diagnosis not present

## 2013-03-02 DIAGNOSIS — E119 Type 2 diabetes mellitus without complications: Secondary | ICD-10-CM | POA: Diagnosis not present

## 2013-03-02 DIAGNOSIS — H1045 Other chronic allergic conjunctivitis: Secondary | ICD-10-CM | POA: Diagnosis not present

## 2013-03-05 DIAGNOSIS — H1045 Other chronic allergic conjunctivitis: Secondary | ICD-10-CM | POA: Diagnosis not present

## 2013-03-13 ENCOUNTER — Other Ambulatory Visit: Payer: Self-pay | Admitting: Internal Medicine

## 2013-03-13 DIAGNOSIS — R21 Rash and other nonspecific skin eruption: Secondary | ICD-10-CM | POA: Diagnosis not present

## 2013-03-13 DIAGNOSIS — R609 Edema, unspecified: Secondary | ICD-10-CM | POA: Diagnosis not present

## 2013-03-13 DIAGNOSIS — I8310 Varicose veins of unspecified lower extremity with inflammation: Secondary | ICD-10-CM | POA: Diagnosis not present

## 2013-05-07 DIAGNOSIS — H35379 Puckering of macula, unspecified eye: Secondary | ICD-10-CM | POA: Diagnosis not present

## 2013-06-06 ENCOUNTER — Ambulatory Visit (HOSPITAL_COMMUNITY): Payer: Medicare Other

## 2013-06-22 DIAGNOSIS — R079 Chest pain, unspecified: Secondary | ICD-10-CM | POA: Diagnosis not present

## 2013-06-22 DIAGNOSIS — E119 Type 2 diabetes mellitus without complications: Secondary | ICD-10-CM | POA: Diagnosis not present

## 2013-06-22 DIAGNOSIS — I10 Essential (primary) hypertension: Secondary | ICD-10-CM | POA: Diagnosis not present

## 2013-06-22 DIAGNOSIS — I739 Peripheral vascular disease, unspecified: Secondary | ICD-10-CM | POA: Diagnosis not present

## 2013-06-23 DIAGNOSIS — E119 Type 2 diabetes mellitus without complications: Secondary | ICD-10-CM | POA: Diagnosis not present

## 2013-06-25 DIAGNOSIS — I10 Essential (primary) hypertension: Secondary | ICD-10-CM | POA: Diagnosis not present

## 2013-06-25 DIAGNOSIS — R079 Chest pain, unspecified: Secondary | ICD-10-CM | POA: Diagnosis not present

## 2013-06-25 DIAGNOSIS — E785 Hyperlipidemia, unspecified: Secondary | ICD-10-CM | POA: Diagnosis not present

## 2013-06-27 DIAGNOSIS — R079 Chest pain, unspecified: Secondary | ICD-10-CM | POA: Diagnosis not present

## 2013-06-29 DIAGNOSIS — M7989 Other specified soft tissue disorders: Secondary | ICD-10-CM | POA: Diagnosis not present

## 2013-06-29 DIAGNOSIS — M79609 Pain in unspecified limb: Secondary | ICD-10-CM | POA: Diagnosis not present

## 2013-06-30 DIAGNOSIS — M79609 Pain in unspecified limb: Secondary | ICD-10-CM | POA: Diagnosis not present

## 2013-07-05 DIAGNOSIS — M79609 Pain in unspecified limb: Secondary | ICD-10-CM | POA: Diagnosis not present

## 2013-07-05 DIAGNOSIS — R609 Edema, unspecified: Secondary | ICD-10-CM | POA: Diagnosis not present

## 2013-07-12 DIAGNOSIS — E119 Type 2 diabetes mellitus without complications: Secondary | ICD-10-CM | POA: Diagnosis not present

## 2013-07-12 DIAGNOSIS — R1011 Right upper quadrant pain: Secondary | ICD-10-CM | POA: Diagnosis not present

## 2013-07-18 ENCOUNTER — Other Ambulatory Visit (HOSPITAL_COMMUNITY): Payer: Self-pay | Admitting: Internal Medicine

## 2013-07-18 DIAGNOSIS — Z1231 Encounter for screening mammogram for malignant neoplasm of breast: Secondary | ICD-10-CM

## 2013-07-19 ENCOUNTER — Ambulatory Visit (HOSPITAL_COMMUNITY)
Admission: RE | Admit: 2013-07-19 | Discharge: 2013-07-19 | Disposition: A | Discharge status: Home / Self Care | Payer: Medicare Other | Source: Ambulatory Visit | Attending: Internal Medicine | Admitting: Internal Medicine

## 2013-07-19 DIAGNOSIS — Z1231 Encounter for screening mammogram for malignant neoplasm of breast: Secondary | ICD-10-CM | POA: Diagnosis not present

## 2013-07-30 DIAGNOSIS — M7730 Calcaneal spur, unspecified foot: Secondary | ICD-10-CM | POA: Diagnosis not present

## 2013-07-30 DIAGNOSIS — M722 Plantar fascial fibromatosis: Secondary | ICD-10-CM | POA: Diagnosis not present

## 2013-08-08 DIAGNOSIS — R079 Chest pain, unspecified: Secondary | ICD-10-CM | POA: Diagnosis not present

## 2013-08-08 DIAGNOSIS — E119 Type 2 diabetes mellitus without complications: Secondary | ICD-10-CM | POA: Diagnosis not present

## 2013-08-21 DIAGNOSIS — M7660 Achilles tendinitis, unspecified leg: Secondary | ICD-10-CM | POA: Diagnosis not present

## 2013-08-21 DIAGNOSIS — M76829 Posterior tibial tendinitis, unspecified leg: Secondary | ICD-10-CM | POA: Diagnosis not present

## 2013-09-01 DIAGNOSIS — I83893 Varicose veins of bilateral lower extremities with other complications: Secondary | ICD-10-CM | POA: Diagnosis not present

## 2013-09-01 DIAGNOSIS — G2581 Restless legs syndrome: Secondary | ICD-10-CM | POA: Diagnosis not present

## 2013-09-01 DIAGNOSIS — I872 Venous insufficiency (chronic) (peripheral): Secondary | ICD-10-CM | POA: Diagnosis not present

## 2013-09-01 DIAGNOSIS — M7989 Other specified soft tissue disorders: Secondary | ICD-10-CM | POA: Diagnosis not present

## 2013-09-04 DIAGNOSIS — Z0000 Encounter for general adult medical examination without abnormal findings: Secondary | ICD-10-CM | POA: Diagnosis not present

## 2013-09-04 DIAGNOSIS — E119 Type 2 diabetes mellitus without complications: Secondary | ICD-10-CM | POA: Diagnosis not present

## 2013-09-06 DIAGNOSIS — R9389 Abnormal findings on diagnostic imaging of other specified body structures: Secondary | ICD-10-CM | POA: Diagnosis not present

## 2013-09-06 DIAGNOSIS — R079 Chest pain, unspecified: Secondary | ICD-10-CM | POA: Diagnosis not present

## 2013-10-18 ENCOUNTER — Emergency Department (HOSPITAL_BASED_OUTPATIENT_CLINIC_OR_DEPARTMENT_OTHER): Payer: Medicare Other

## 2013-10-18 ENCOUNTER — Observation Stay (HOSPITAL_BASED_OUTPATIENT_CLINIC_OR_DEPARTMENT_OTHER)
Admission: EM | Admit: 2013-10-18 | Discharge: 2013-10-19 | Disposition: A | Discharge status: Home / Self Care | Payer: Medicare Other | Attending: Internal Medicine | Admitting: Internal Medicine

## 2013-10-18 ENCOUNTER — Encounter (HOSPITAL_BASED_OUTPATIENT_CLINIC_OR_DEPARTMENT_OTHER): Payer: Self-pay | Admitting: Emergency Medicine

## 2013-10-18 DIAGNOSIS — R9439 Abnormal result of other cardiovascular function study: Secondary | ICD-10-CM

## 2013-10-18 DIAGNOSIS — I200 Unstable angina: Secondary | ICD-10-CM

## 2013-10-18 DIAGNOSIS — R42 Dizziness and giddiness: Secondary | ICD-10-CM | POA: Insufficient documentation

## 2013-10-18 DIAGNOSIS — M79603 Pain in arm, unspecified: Secondary | ICD-10-CM

## 2013-10-18 DIAGNOSIS — R0602 Shortness of breath: Secondary | ICD-10-CM | POA: Insufficient documentation

## 2013-10-18 DIAGNOSIS — M79609 Pain in unspecified limb: Secondary | ICD-10-CM | POA: Diagnosis not present

## 2013-10-18 DIAGNOSIS — M79602 Pain in left arm: Secondary | ICD-10-CM

## 2013-10-18 DIAGNOSIS — R071 Chest pain on breathing: Secondary | ICD-10-CM | POA: Diagnosis not present

## 2013-10-18 DIAGNOSIS — R079 Chest pain, unspecified: Principal | ICD-10-CM | POA: Diagnosis present

## 2013-10-18 DIAGNOSIS — E119 Type 2 diabetes mellitus without complications: Secondary | ICD-10-CM | POA: Insufficient documentation

## 2013-10-18 DIAGNOSIS — I10 Essential (primary) hypertension: Secondary | ICD-10-CM | POA: Diagnosis not present

## 2013-10-18 HISTORY — DX: Abnormal result of other cardiovascular function study: R94.39

## 2013-10-18 HISTORY — DX: Pain in arm, unspecified: M79.603

## 2013-10-18 HISTORY — DX: Chest pain, unspecified: R07.9

## 2013-10-18 LAB — COMPREHENSIVE METABOLIC PANEL
AST: 14 U/L (ref 0–37)
BUN: 16 mg/dL (ref 6–23)
CO2: 25 mEq/L (ref 19–32)
Calcium: 9.1 mg/dL (ref 8.4–10.5)
Creatinine, Ser: 0.8 mg/dL (ref 0.50–1.10)
GFR calc Af Amer: 86 mL/min — ABNORMAL LOW (ref >90)
GFR calc non Af Amer: 75 mL/min — ABNORMAL LOW (ref >90)
Total Bilirubin: 0.2 mg/dL — ABNORMAL LOW (ref 0.3–1.2)

## 2013-10-18 LAB — CBC WITH DIFFERENTIAL/PLATELET
Basophils Absolute: 0 10*3/uL (ref 0.0–0.1)
Eosinophils Relative: 1 % (ref 0–5)
HCT: 38.4 % (ref 36.0–46.0)
Hemoglobin: 12.9 g/dL (ref 12.0–15.0)
Lymphocytes Relative: 33 % (ref 12–46)
MCHC: 33.6 g/dL (ref 30.0–36.0)
MCV: 90.8 fL (ref 78.0–100.0)
Monocytes Absolute: 0.5 10*3/uL (ref 0.1–1.0)
Monocytes Relative: 9 % (ref 3–12)
RDW: 14 % (ref 11.5–15.5)
WBC: 5.9 10*3/uL (ref 4.0–10.5)

## 2013-10-18 LAB — URINALYSIS, ROUTINE W REFLEX MICROSCOPIC
Glucose, UA: NEGATIVE mg/dL
Ketones, ur: NEGATIVE mg/dL
Leukocytes, UA: NEGATIVE
Nitrite: NEGATIVE
Protein, ur: NEGATIVE mg/dL
Urobilinogen, UA: 1 mg/dL (ref 0.0–1.0)

## 2013-10-18 LAB — TROPONIN I: Troponin I: <0.3 ng/mL (ref <0.30)

## 2013-10-18 IMAGING — CR DG CHEST 2V
2 series · 2 of 2 positions shown · non-contrast
Comparison: 12/11/2011

CLINICAL DATA: Chest pain for 3 months

EXAM:
CHEST  2 VIEW

[w chest pa]
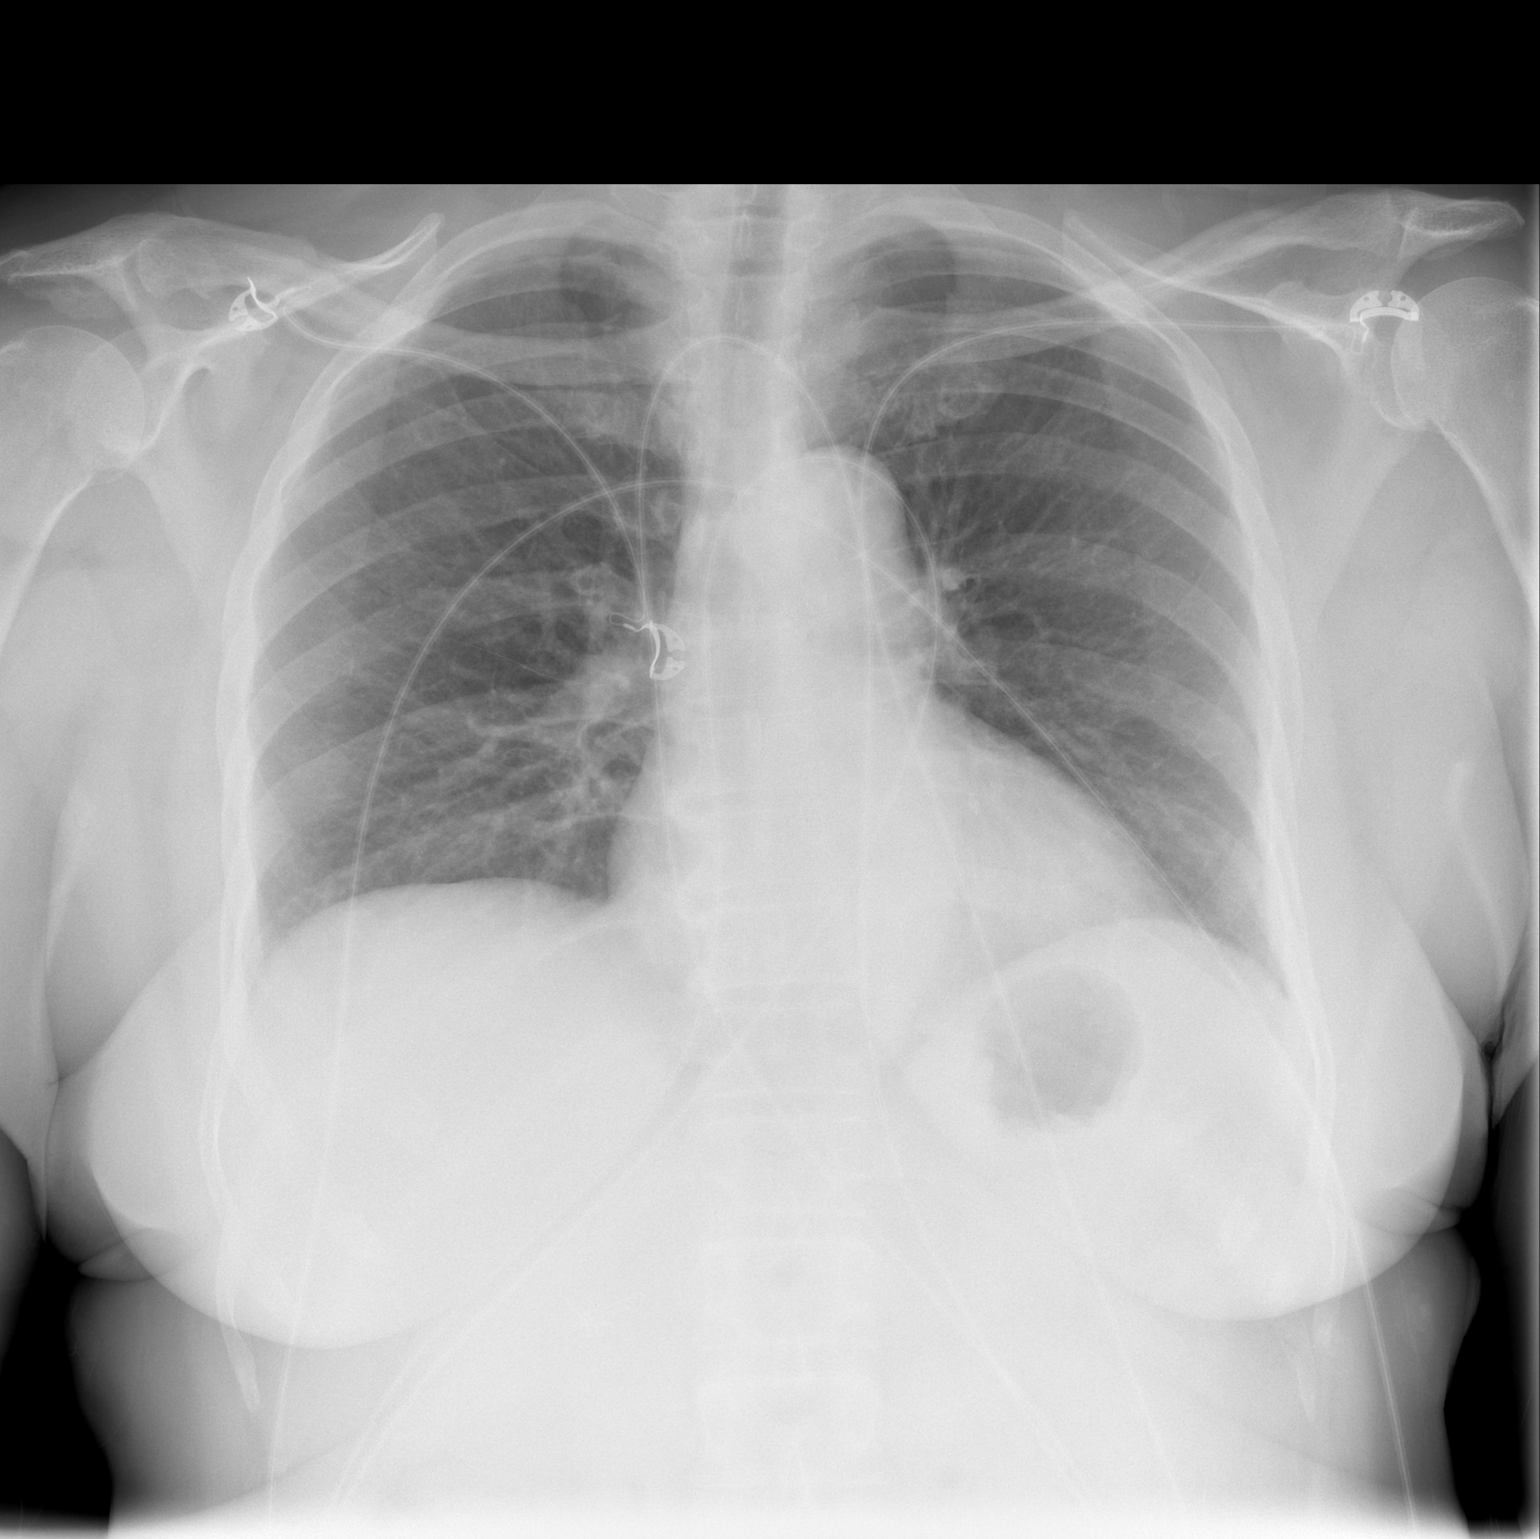

[w chest lat]
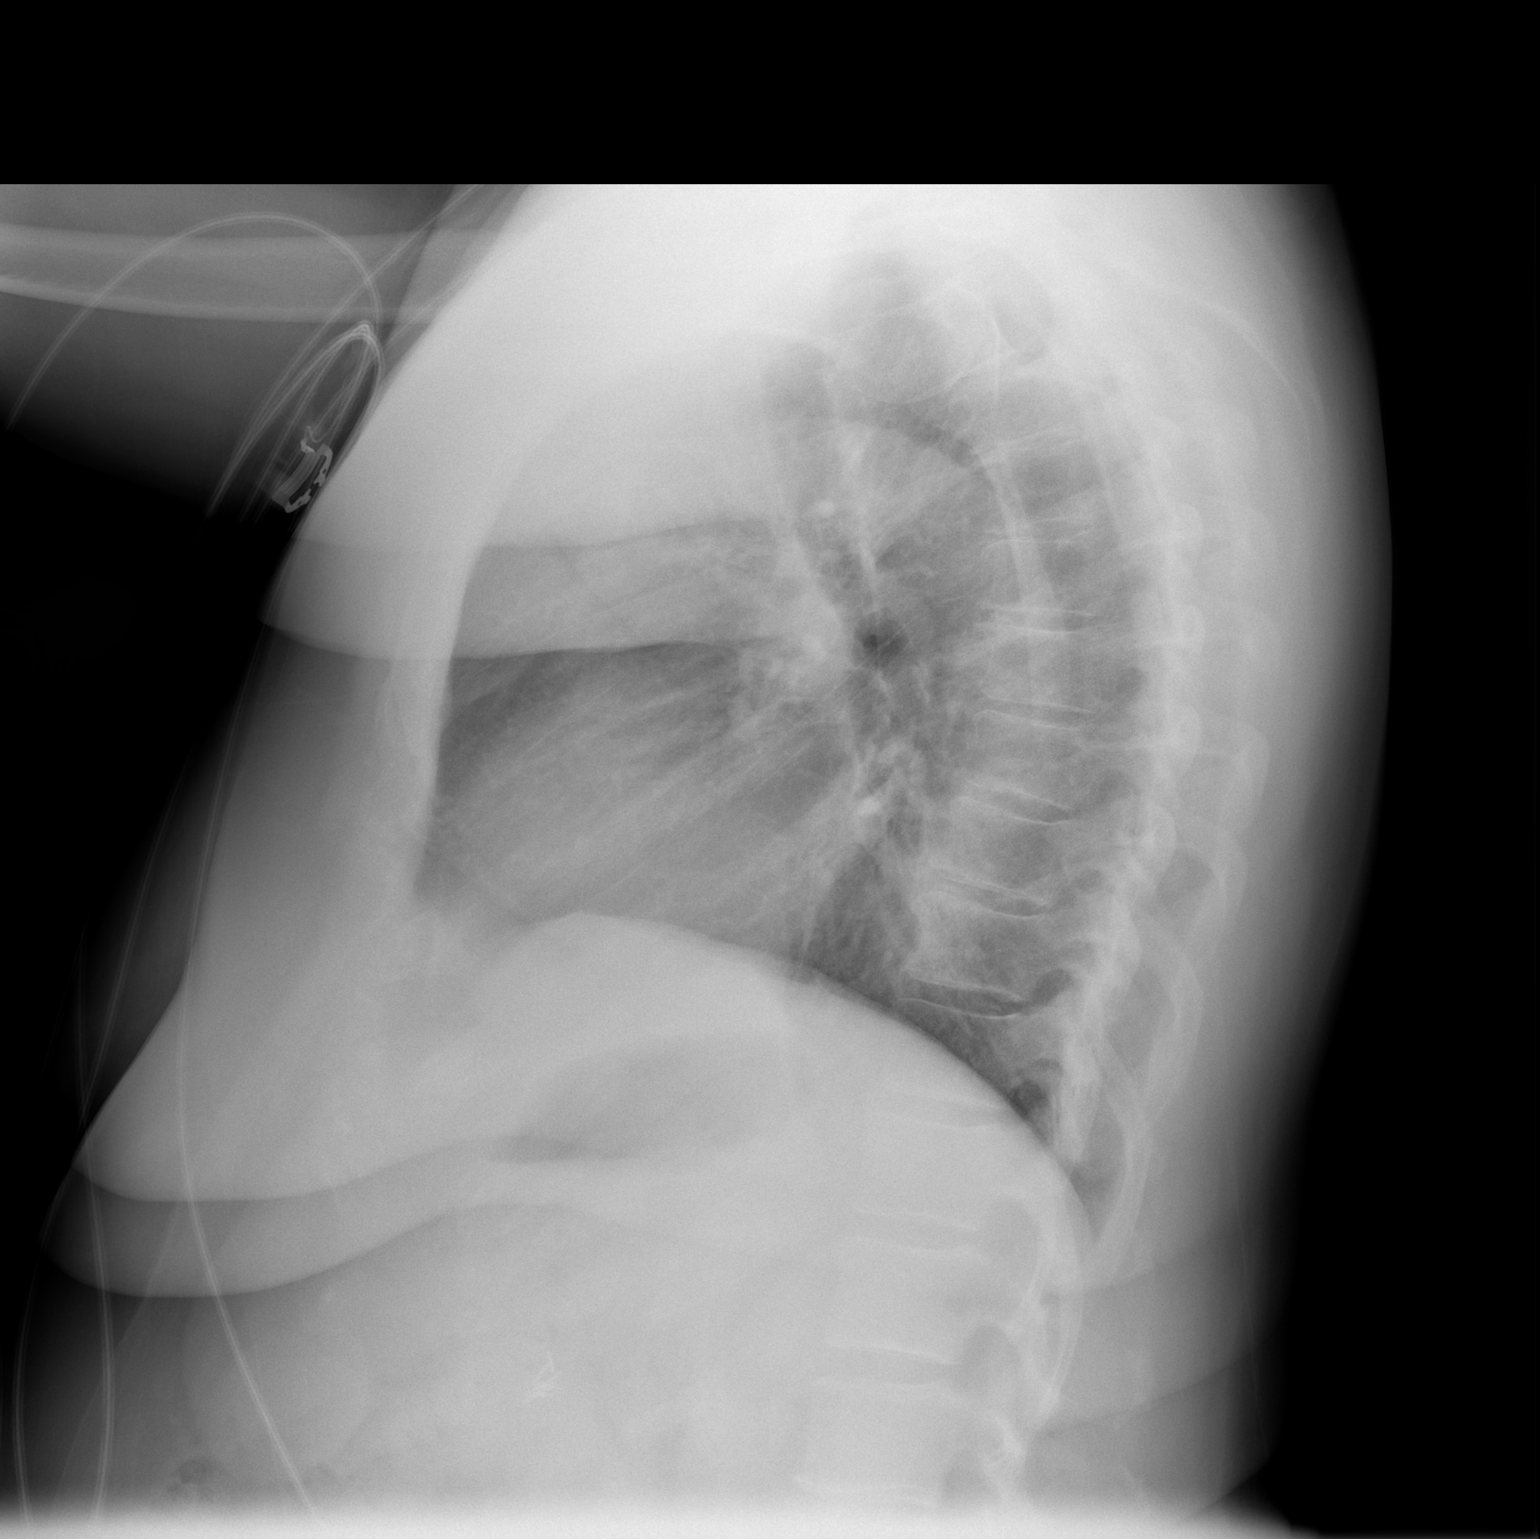

[2 of 2 positions shown; findings below may reference images not displayed]

FINDINGS: Cardiomediastinal silhouette is stable. No acute infiltrate or
pleural effusion. No pulmonary edema. Stable mild degenerative
changes thoracic spine.
IMPRESSION: No active cardiopulmonary disease.

## 2013-10-18 MED ORDER — SODIUM CHLORIDE 0.9 % IJ SOLN: 3.0000 mL | INTRAMUSCULAR | Status: DC | PRN

## 2013-10-18 MED ORDER — SODIUM CHLORIDE 0.9 % IV SOLN: 250.0000 mL | INTRAVENOUS | Status: DC | PRN

## 2013-10-18 MED ORDER — ASPIRIN 81 MG PO CHEW
81.0000 mg | CHEWABLE_TABLET | Freq: Every day | ORAL | Status: DC
  Administered 2013-10-19: 81 mg via ORAL
  Filled 2013-10-18: qty 1

## 2013-10-18 MED ORDER — ASPIRIN EC 81 MG PO TBEC
81.0000 mg | DELAYED_RELEASE_TABLET | Freq: Every day | ORAL | Status: DC
  Filled 2013-10-18: qty 1

## 2013-10-18 MED ORDER — METFORMIN HCL 500 MG PO TABS
500.0000 mg | ORAL_TABLET | Freq: Every day | ORAL | Status: DC
  Filled 2013-10-18 (×2): qty 1

## 2013-10-18 MED ORDER — ASPIRIN 81 MG PO CHEW: 324.0000 mg | CHEWABLE_TABLET | Freq: Once | ORAL | Status: DC

## 2013-10-18 MED ORDER — ASPIRIN 81 MG PO CHEW
324.0000 mg | CHEWABLE_TABLET | Freq: Once | ORAL | Status: AC
  Administered 2013-10-18: 324 mg via ORAL
  Filled 2013-10-18: qty 4

## 2013-10-18 MED ORDER — SODIUM CHLORIDE 0.9 % IJ SOLN: 3.0000 mL | Freq: Two times a day (BID) | INTRAMUSCULAR | Status: DC

## 2013-10-18 MED ORDER — HYDROCODONE-ACETAMINOPHEN 5-325 MG PO TABS: 1.0000 | ORAL_TABLET | ORAL | Status: DC | PRN

## 2013-10-18 NOTE — ED Notes (Signed)
Attempted IV x 1 without success.  

## 2013-10-18 NOTE — ED Notes (Signed)
Pt. Is in SR on monitor wit no sob

## 2013-10-18 NOTE — ED Notes (Signed)
Report given to misty, rn.

## 2013-10-18 NOTE — ED Notes (Signed)
Pt to room 3 in w/c, ekg in progress, pt reports "heaviness" to her chest off and on x 3 months, seen by a cardiologist in [place] while visiting a few weeks ago and had nuclear stress test and u/s in November, was told her test results were "a false positive".

## 2013-10-18 NOTE — H&P (Signed)
PCP:   Juline Patch, MD   Chief Complaint:  Arm pain  HPI: 67 yo female with dm, htn and recent cardiac cath done one month ago was normal (initiated after an abnormal stress test showing ??reversible area of ischemia, after the cath the cardiologist told her she had a "false positive test") transferred here from [facility] center for chest pain concerns for acs.  Pt is complaining of sharp shooting pain from her shoulder down her arm that is sharp lasts for several minutes then her arm gets weak and tingling in her fingers.  This has happened several times over the last day.  No neck pain.  No fevers.  No headache.  No facial n/t.  No facial drooping or slurred speech.  No pain worse with movement of left shoulder or elbow.  No recent trauma.  No associated sob/n/v.  Pain free now.  Review of Systems:  Positive and negative as per HPI otherwise all other systems are negative  Past Medical History: Past Medical History  Diagnosis Date  . Hypertension   . Diabetes mellitus    Past Surgical History  Procedure Laterality Date  . Abdominal hysterectomy    . Cholecystectomy    . Hernia repair      Medications: Prior to Admission medications   Medication Sig Start Date End Date Taking? Authorizing Provider  aspirin 81 MG chewable tablet Chew 81 mg by mouth daily.   Yes Historical Provider, MD  metFORMIN (GLUCOPHAGE) 500 MG tablet Take 500 mg by mouth daily with breakfast.    Yes Historical Provider, MD  omeprazole (PRILOSEC) 20 MG capsule Take 20 mg by mouth daily.   Yes Historical Provider, MD  Telmisartan-Amlodipine (TWYNSTA) 80-5 MG TABS Take 1 tablet by mouth daily.   Yes Historical Provider, MD    Allergies:   Allergies  Allergen Reactions  . Cephalexin Other (See Comments)    "blocked my urine"  . Codeine Rash    Social History:  reports that she has never smoked. She does not have any smokeless tobacco history on file. She reports that she does not drink alcohol or use  illicit drugs.  Family History: History reviewed. No pertinent family history.  Physical Exam: Filed Vitals:   10/18/13 1527 10/18/13 1830 10/18/13 1921 10/18/13 2029  BP: 160/88 160/88 153/88 193/97  Pulse: 80 79  78  Temp: 99.2 F (37.3 C)   97 F (36.1 C)  TempSrc: Oral   Oral  Resp: 18 17    Height: 5\' 8"  (1.727 m)   5\' 8"  (1.727 m)  Weight: 104.327 kg (230 lb)     SpO2: 100% 100%  100%   General appearance: alert, cooperative and no distress Head: Normocephalic, without obvious abnormality, atraumatic Eyes: negative Nose: Nares normal. Septum midline. Mucosa normal. No drainage or sinus tenderness. Neck: no JVD and supple, symmetrical, trachea midline Lungs: clear to auscultation bilaterally Heart: regular rate and rhythm, S1, S2 normal, no murmur, click, rub or gallop Abdomen: soft, non-tender; bowel sounds normal; no masses,  no organomegaly Extremities: extremities normal, atraumatic, no cyanosis or edema Pulses: 2+ and symmetric Skin: Skin color, texture, turgor normal. No rashes or lesions Neurologic: Grossly normal    Labs on Admission:   Recent Labs  10/18/13 1630  NA 142  K 3.9  CL 107  CO2 25  GLUCOSE 103*  BUN 16  CREATININE 0.80  CALCIUM 9.1    Recent Labs  10/18/13 1630  AST 14  ALT 15  ALKPHOS 78  BILITOT 0.2*  PROT 7.0  ALBUMIN 3.5    Recent Labs  10/18/13 1630  WBC 5.9  NEUTROABS 3.4  HGB 12.9  HCT 38.4  MCV 90.8  PLT 211    Recent Labs  10/18/13 1630  TROPONINI <0.30   Radiological Exams on Admission: Dg Chest 2 View  10/18/2013   CLINICAL DATA:  Chest pain for 3 months  EXAM: CHEST  2 VIEW  COMPARISON:  12/11/2011  FINDINGS: Cardiomediastinal silhouette is stable. No acute infiltrate or pleural effusion. No pulmonary edema. Stable mild degenerative changes thoracic spine.  IMPRESSION: No active cardiopulmonary disease.   Electronically Signed   By: Natasha Mead M.D.   On: 10/18/2013 16:38    Assessment/Plan  67 yo  female with left arm sharp shooting stabbing pain with brief weakness/tingling in fingers  Principal Problem:   Chest pain- actually shoulder/arm pain.  Highly doubt acs with recent cath.  Serial enzymes.  No further cardiac w/u.  ekg nsr no acute changes.  Will ck c spine series in am, as pain sounds neurological in nature (more peripheral than central).    Active Problems:   Hypertension   Diabetes   Cardiovascular stress test abnormal   Arm pain    Ambrea Hegler A 10/18/2013, 11:50 PM

## 2013-10-18 NOTE — ED Provider Notes (Signed)
CSN: [id number]     Arrival date & time 10/18/13  1524 History   First MD Initiated Contact with Patient 10/18/13 1531     Chief Complaint  Patient presents with  . Chest Pain  . Arm Pain   (Consider location/radiation/quality/duration/timing/severity/associated sxs/prior Treatment) HPI Comments: Patient complains of a 2 day history of pain in her left arm it comes on with exertion and going upstairs. Radiates into her chest. It lasts about 2 minutes and resolved. She denies any trauma or injury. She denies any cardiac history. She reports a "false positive" stress test in [place] 2 months ago. She's never had a heart attack. She is a diabetic and hypertensive. She endorses some pain in her chest that seems to be reproducible and unrelated to the arm pain. She denies any focal weakness, numbness or tingling. She denies any nausea or arm pain comes on she feels dizzy, lightheaded like she is going to pass out and has shortness of breath.  The history is provided by the patient.    Past Medical History  Diagnosis Date  . Hypertension   . Diabetes mellitus    Past Surgical History  Procedure Laterality Date  . Abdominal hysterectomy    . Cholecystectomy    . Hernia repair     History reviewed. No pertinent family history. History  Substance Use Topics  . Smoking status: Never Smoker   . Smokeless tobacco: Not on file  . Alcohol Use: No   OB History   Grav Para Term Preterm Abortions TAB SAB Ect Mult Living                 Review of Systems  Constitutional: Negative for fever, activity change and appetite change.  Respiratory: Positive for chest tightness and shortness of breath. Negative for cough.   Cardiovascular: Positive for chest pain.  Gastrointestinal: Negative for nausea, vomiting and abdominal pain.  Genitourinary: Negative for dysuria, hematuria, vaginal bleeding and vaginal discharge.  Musculoskeletal: Negative for arthralgias and myalgias.  Skin: Negative for  rash.  Neurological: Positive for dizziness and light-headedness. Negative for weakness and headaches.  A complete 10 system review of systems was obtained and all systems are negative except as noted in the HPI and PMH.    Allergies  Cephalexin and Codeine  Home Medications   Current Outpatient Rx  Name  Route  Sig  Dispense  Refill  . omeprazole (PRILOSEC) 20 MG capsule   Oral   Take 20 mg by mouth daily.         . metFORMIN (GLUCOPHAGE) 500 MG tablet   Oral   Take 500 mg by mouth 2 (two) times daily with a meal.         . Telmisartan-Amlodipine (TWYNSTA PO)   Oral   Take by mouth daily.          BP 160/88  Pulse 79  Temp(Src) 99.2 F (37.3 C) (Oral)  Resp 17  Ht 5\' 8"  (1.727 m)  Wt 230 lb (104.327 kg)  BMI 34.98 kg/m2  SpO2 100% Physical Exam  Constitutional: She is oriented to person, place, and time. She appears well-developed and well-nourished. No distress.  HENT:  Head: Normocephalic and atraumatic.  Mouth/Throat: Oropharynx is clear and moist. No oropharyngeal exudate.  Eyes: Conjunctivae and EOM are normal. Pupils are equal, round, and reactive to light.  Neck: Normal range of motion. Neck supple.  Cardiovascular: Normal rate, regular rhythm and normal heart sounds.   No murmur heard. Pulmonary/Chest:  Effort normal and breath sounds normal. No respiratory distress. She exhibits tenderness.  L chest wall tenderness  Abdominal: Soft. There is no tenderness. There is no rebound and no guarding.  Musculoskeletal: Normal range of motion. She exhibits no edema and no tenderness.  There is a slight area of patchy erythema to left upper arm that is nontender. No tenderness to palpation. Equal radial pulses, 5 out of 5 strength bilaterally  Neurological: She is alert and oriented to person, place, and time. No cranial nerve deficit. She exhibits normal muscle tone. Coordination normal.  Skin: Skin is warm.    ED Course  Procedures (including critical care  time) Labs Review Labs Reviewed  COMPREHENSIVE METABOLIC PANEL - Abnormal; Notable for the following:    Glucose, Bld 103 (*)    Total Bilirubin 0.2 (*)    GFR calc non Af Amer 75 (*)    GFR calc Af Amer 86 (*)    All other components within normal limits  URINALYSIS, ROUTINE W REFLEX MICROSCOPIC - Abnormal; Notable for the following:    Bilirubin Urine SMALL (*)    All other components within normal limits  CBC WITH DIFFERENTIAL  TROPONIN I   Imaging Review Dg Chest 2 View  10/18/2013   CLINICAL DATA:  Chest pain for 3 months  EXAM: CHEST  2 VIEW  COMPARISON:  12/11/2011  FINDINGS: Cardiomediastinal silhouette is stable. No acute infiltrate or pleural effusion. No pulmonary edema. Stable mild degenerative changes thoracic spine.  IMPRESSION: No active cardiopulmonary disease.   Electronically Signed   By: Natasha Mead M.D.   On: 10/18/2013 16:38    EKG Interpretation    Date/Time:  Thursday October 18 2013 15:35:36 EST Ventricular Rate:  77 PR Interval:  146 QRS Duration: 88 QT Interval:  420 QTC Calculation: 475 R Axis:   24 Text Interpretation:  Normal sinus rhythm Normal ECG No significant change was found Confirmed by Manus Gunning  MD, Azaliyah Kennard ([id number]) on 10/18/2013 3:43:30 PM            MDM   1. Chest pain    2 day history of arm pain that comes and goes with exertion. Associated with reproducible chest pain.  EKG normal sinus rhythm without ST changes. Pain-free at this time.  Records obtained from [facility] medical [facility] in [place]. Echocardiogram 06/27/2013 showed normal left ventricular function with minimal mitral and tricuspid regurgitation. Nuclear stress test 08/08/2013 showed  reversible defect in the basal inferior wall and may represent a small region of ischemia.  Troponin negative. EKG without acute ischemic change. Patient continues to have episodes of left upper arm pain that come and go in the ED. She's been given aspirin. She denies  any chest pain unless you palpate her chest wall.  Given her age and risk factors and ongoing arm pain she will need admission for cardiac evaluation.  Glynn Octave, MD 10/18/13 [phone]

## 2013-10-18 NOTE — Procedures (Signed)
RCS     Performed arterial puncture for labwork. Venipunctures by RN were not successful.

## 2013-10-18 NOTE — Progress Notes (Signed)
67yo with hx of HTN, dm, nonsmoker with 2 day hx of exertional L arm pain with exertion to chest, Recent abnrl stress test in [place]. Per pt, subsequent tests were unremarkable. No SOB. No acute changes on EKG. Trop neg x 1. Accepted to med-tele.

## 2013-10-19 ENCOUNTER — Observation Stay (HOSPITAL_COMMUNITY): Payer: Medicare Other

## 2013-10-19 DIAGNOSIS — G459 Transient cerebral ischemic attack, unspecified: Secondary | ICD-10-CM | POA: Diagnosis not present

## 2013-10-19 DIAGNOSIS — M79609 Pain in unspecified limb: Secondary | ICD-10-CM | POA: Diagnosis not present

## 2013-10-19 DIAGNOSIS — R079 Chest pain, unspecified: Secondary | ICD-10-CM | POA: Diagnosis not present

## 2013-10-19 DIAGNOSIS — M542 Cervicalgia: Secondary | ICD-10-CM | POA: Diagnosis not present

## 2013-10-19 DIAGNOSIS — E119 Type 2 diabetes mellitus without complications: Secondary | ICD-10-CM | POA: Diagnosis not present

## 2013-10-19 LAB — TROPONIN I
Troponin I: <0.3 ng/mL (ref <0.30)
Troponin I: <0.3 ng/mL (ref <0.30)

## 2013-10-19 LAB — GLUCOSE, CAPILLARY

## 2013-10-19 LAB — HEMOGLOBIN A1C
Hgb A1c MFr Bld: 7 % — ABNORMAL HIGH (ref <5.7)
Mean Plasma Glucose: 154 mg/dL — ABNORMAL HIGH (ref <117)

## 2013-10-19 IMAGING — MR MR HEAD W/O CM
9 of 10 series · 35 of 48 positions shown · non-contrast
Comparison: CT 12/23/2009.  MRI 04/16/2004

CLINICAL DATA: Left arm weakness.  Rule out stroke

EXAM:
MRI HEAD WITHOUT CONTRAST
TECHNIQUE: Multiplanar, multiecho pulse sequences of the brain and surrounding
structures were obtained without intravenous contrast.

[Series 3: T1 · sagittal · 5.0mm · 0.47mm/px · 4 of 23 slices shown]
[im 1/23]
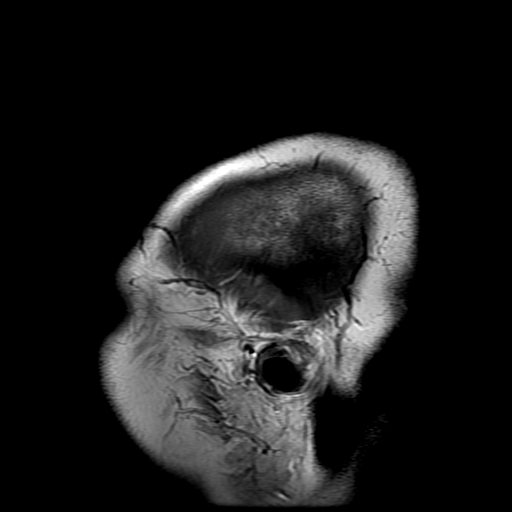
[im 8/23]
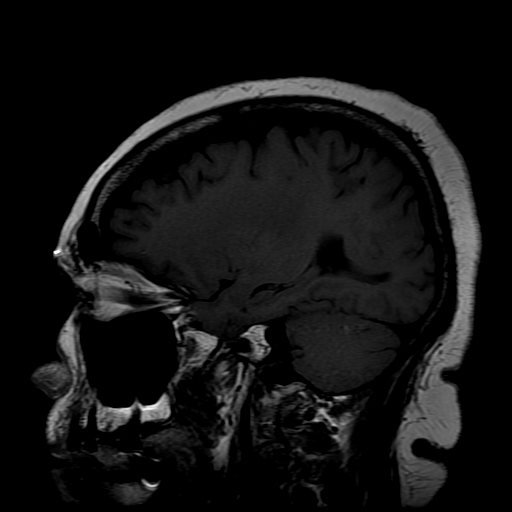
[im 15/23]
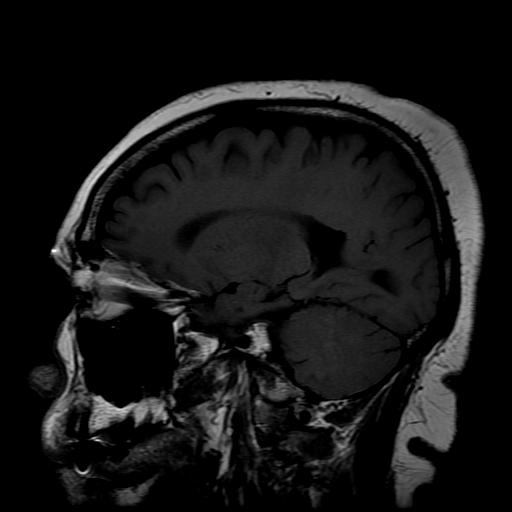
[im 23/23]
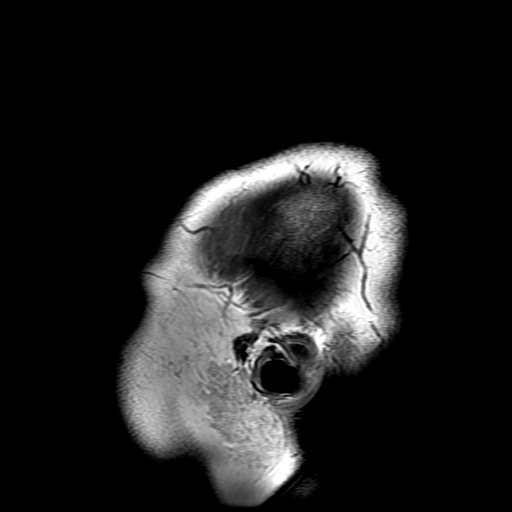

[Series 4: DWI · axial · 5.0mm · 1.09mm/px · z∈[-70,+79]mm · 7 of 62 slices shown (1 of 4)]
[im 1/62]
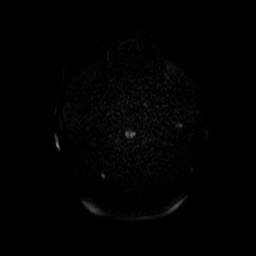
[im 11/62]
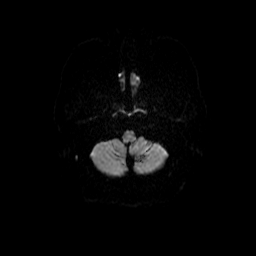
[im 21/62]
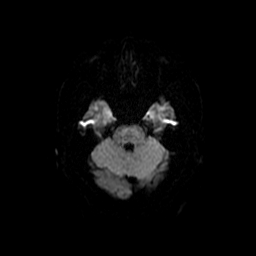
[im 31/62]
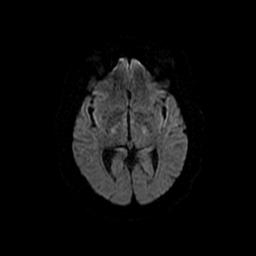
[im 41/62]
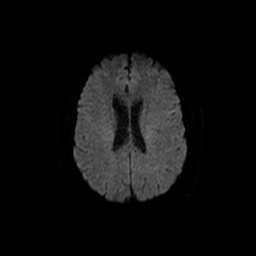
[im 51/62]
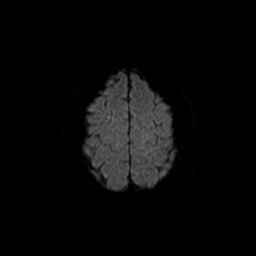
[im 62/62]
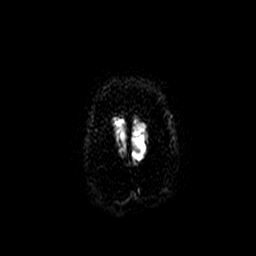

[Series 5: DWI · coronal · 5.0mm · 1.09mm/px · 8 of 70 slices shown (2 of 4)]
[im 1/70]
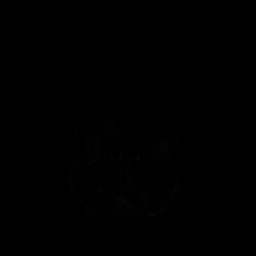
[im 10/70]
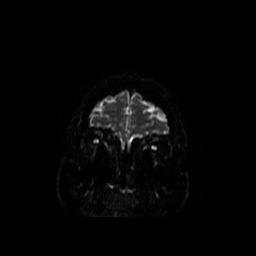
[im 20/70]
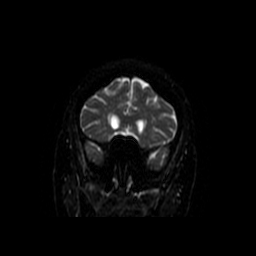
[im 30/70]
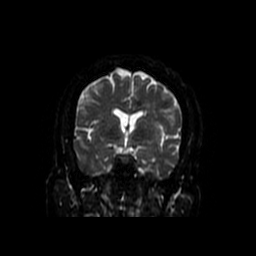
[im 40/70]
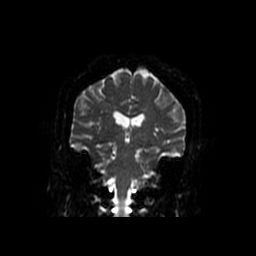
[im 50/70]
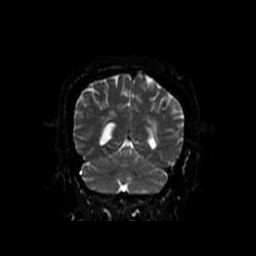
[im 60/70]
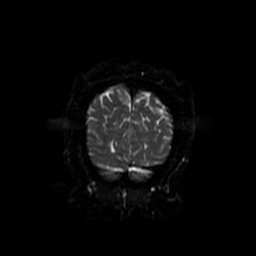
[im 70/70]
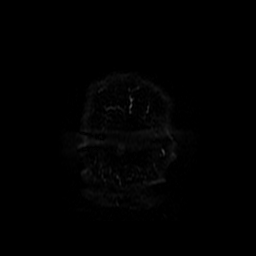

[Series 6: T2 · axial · 5.0mm · 0.43mm/px · z∈[-72,+74]mm · 2 of 22 slices shown (1 of 2)]
[im 1/22]
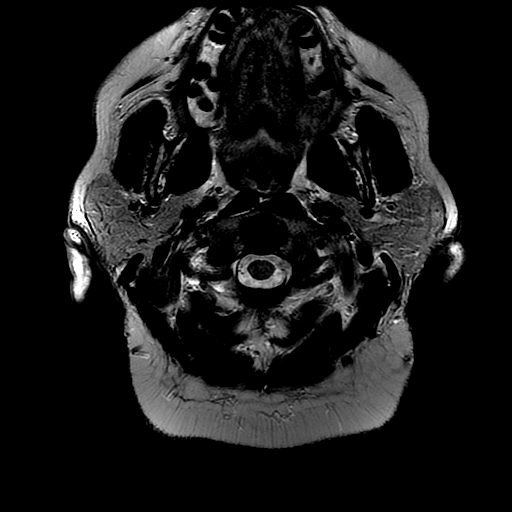
[im 22/22]
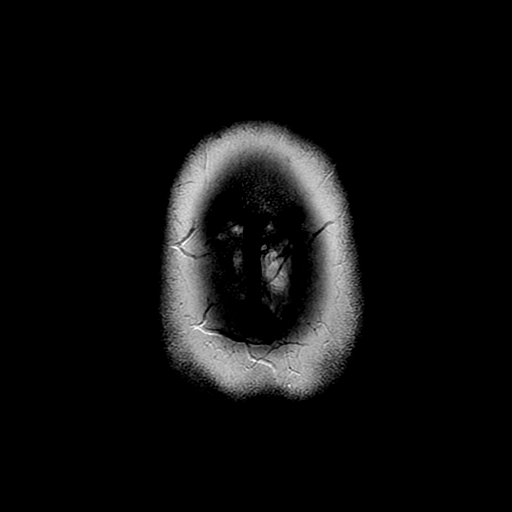

[Series 7: FLAIR · axial · 5.0mm · 0.43mm/px · z∈[-72,+74]mm · 2 of 22 slices shown]
[im 1/22]
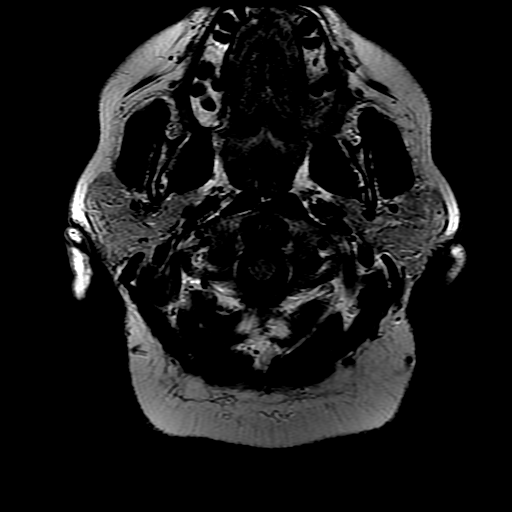
[im 22/22]
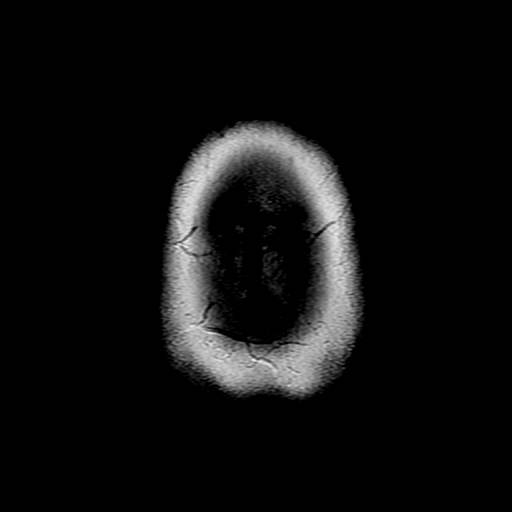

[Series 8: ax mpgr · axial · 5.0mm · 0.43mm/px · z∈[-72,+74]mm · 2 of 22 slices shown]
[im 1/22]
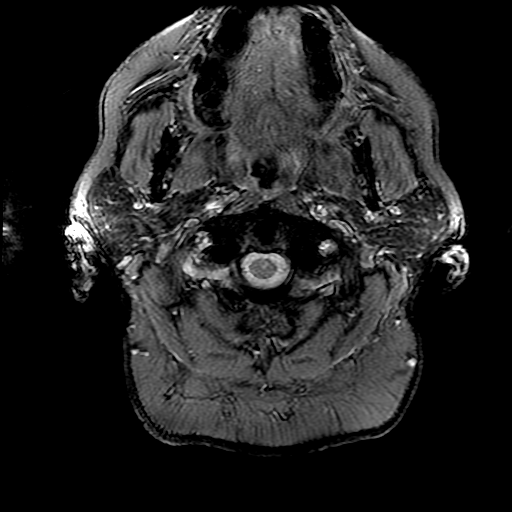
[im 22/22]
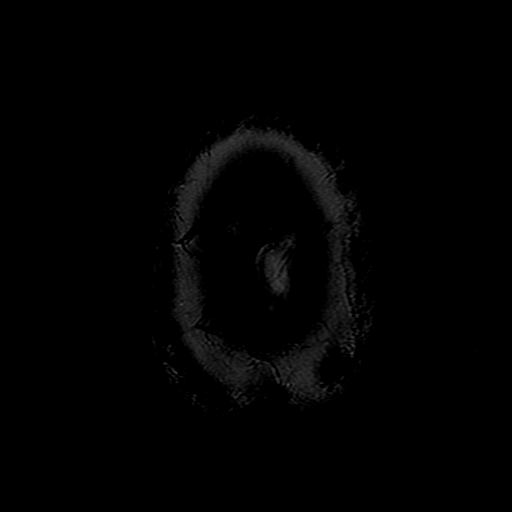

[Series 10: T2 · coronal · 5.0mm · 0.39mm/px · 3 of 27 slices shown (2 of 2)]
[im 1/27]
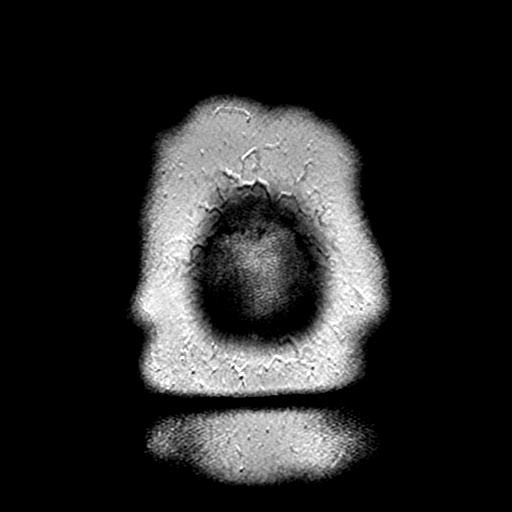
[im 14/27]
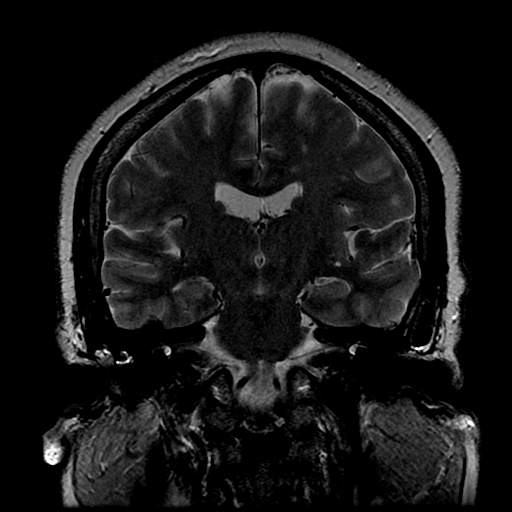
[im 27/27]
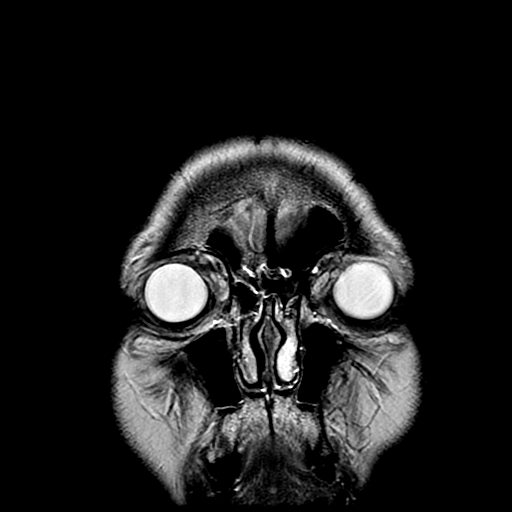

[Series 400: DWI · axial · 5.0mm · 1.09mm/px · z∈[-70,+79]mm · 3 of 31 slices shown (3 of 4)]
[im 1/31]
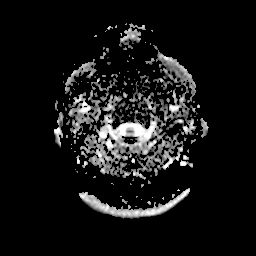
[im 16/31]
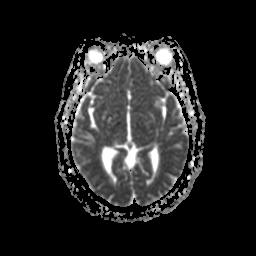
[im 31/31]
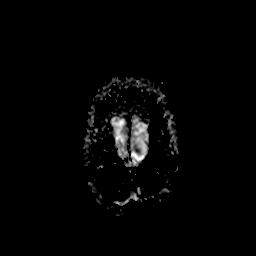

[Series 500: DWI · coronal · 5.0mm · 1.09mm/px · 4 of 35 slices shown (4 of 4)]
[im 1/35]
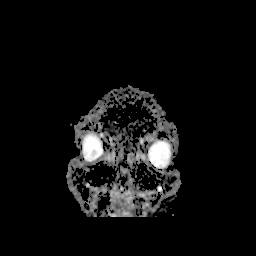
[im 12/35]
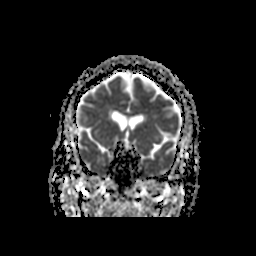
[im 23/35]
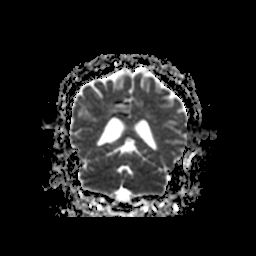
[im 35/35]
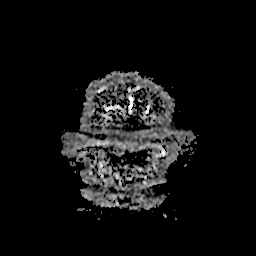

[35 of 48 positions shown; findings below may reference images not displayed]

FINDINGS: Multiple small areas of hyperintensity in the cerebral white matter
bilaterally. This has progressed significantly since 1224. These are
probably in the deep and subcortical white matter. The brainstem and
cerebellum are intact.

Diffusion-weighted imaging is negative for acute infarct. Negative
for hemorrhage or mass.

Ventricle size is normal. No midline shift. Vessels of the base of
brain are patent.

Paranasal sinuses are clear.
IMPRESSION: Moderately severe chronic white matter changes with significant
progression since 1224. This pattern is most consistent with chronic
microvascular ischemia, especially in this patient with hypertension
and diabetes.

No acute infarct.

## 2013-10-19 IMAGING — MR MR CERVICAL SPINE W/O CM
4 of 5 series · 19 of 48 positions shown · non-contrast
Comparison: Cervical spine radiographs dated 02/21/2011

CLINICAL DATA: Neck pain. Left arm weakness with numbness in the
fingers.

EXAM:
MRI CERVICAL SPINE WITHOUT CONTRAST
TECHNIQUE: Multiplanar, multisequence MR imaging was performed. No intravenous
contrast was administered.

[Series 2: T2 · sagittal · 3.0mm · 0.43mm/px · 6 of 12 slices shown (1 of 2)]
[im 1/12]
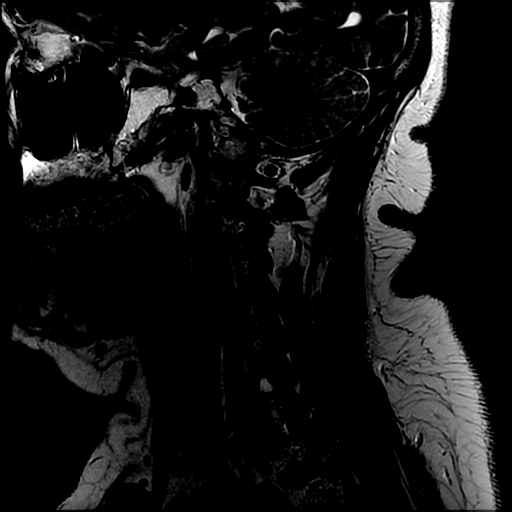
[im 3/12]
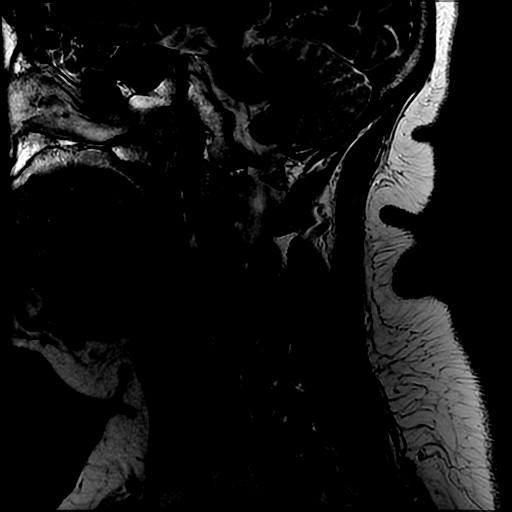
[im 5/12]
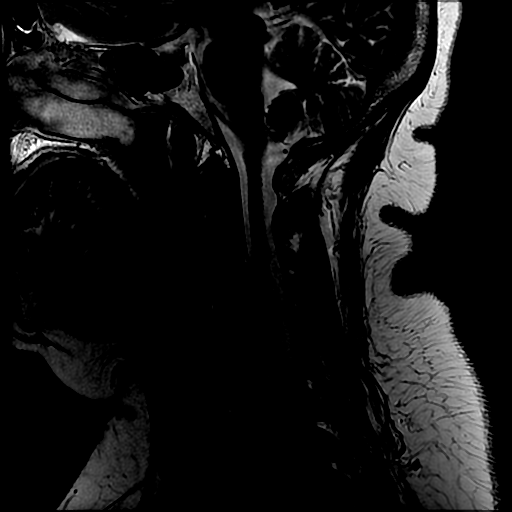
[im 7/12]
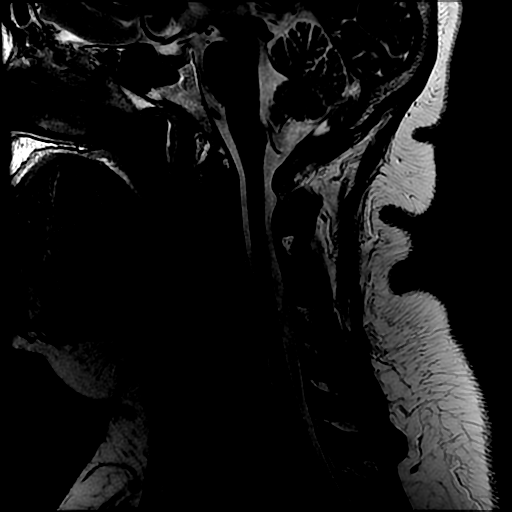
[im 9/12]
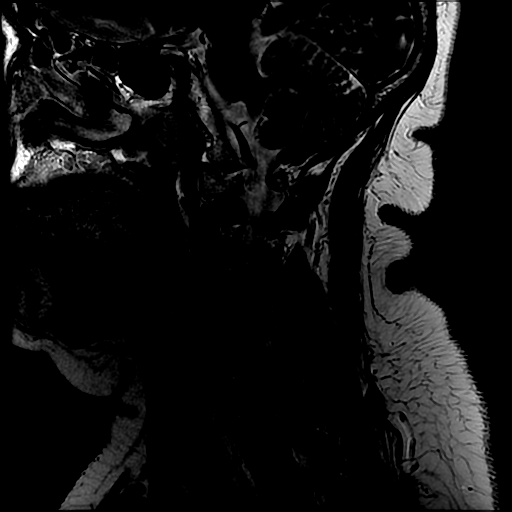
[im 12/12]
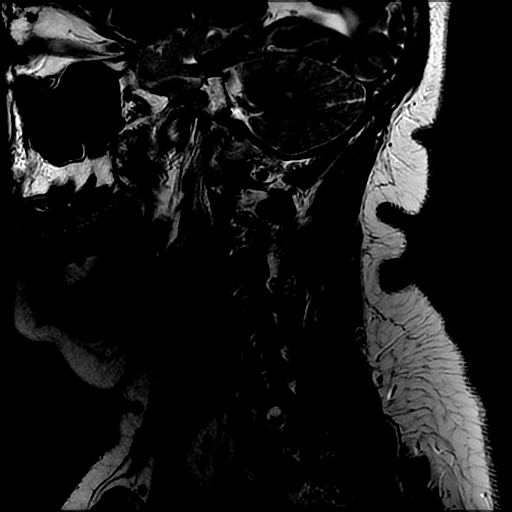

[Series 3: T1 · sagittal · 3.0mm · 0.43mm/px · 3 of 12 slices shown]
[im 3/12]
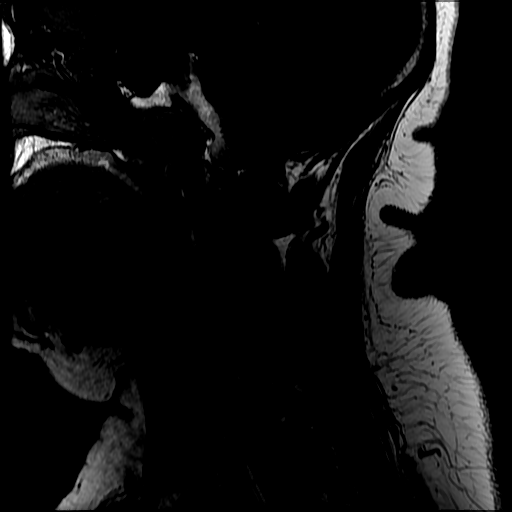
[im 7/12]
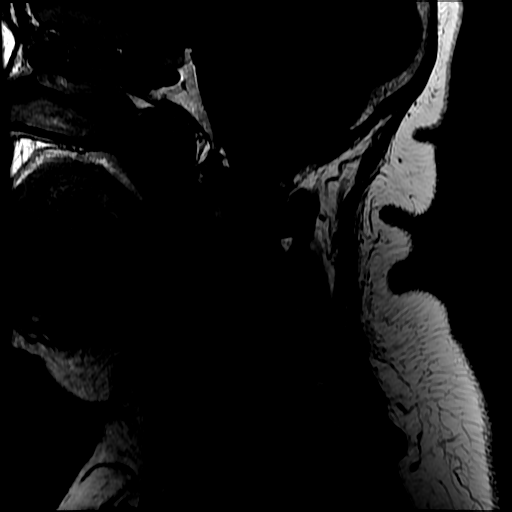
[im 12/12]
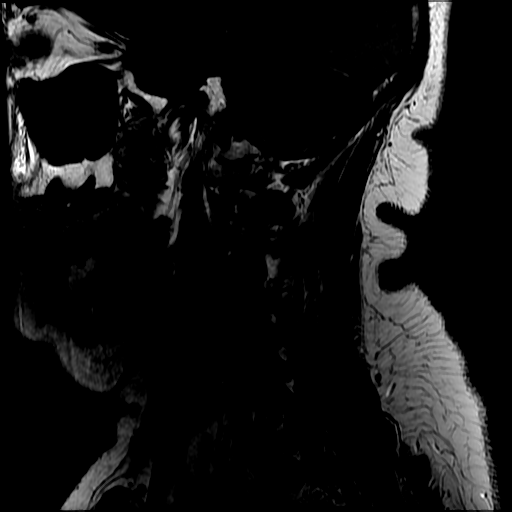

[Series 4: sag ir · sagittal · 3.0mm · 0.43mm/px · 3 of 12 slices shown]
[im 3/12]
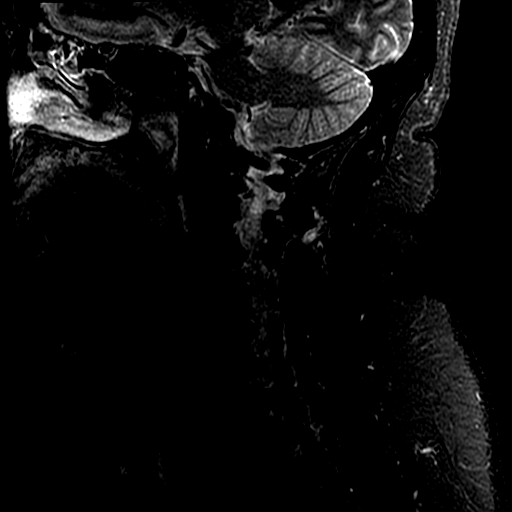
[im 7/12]
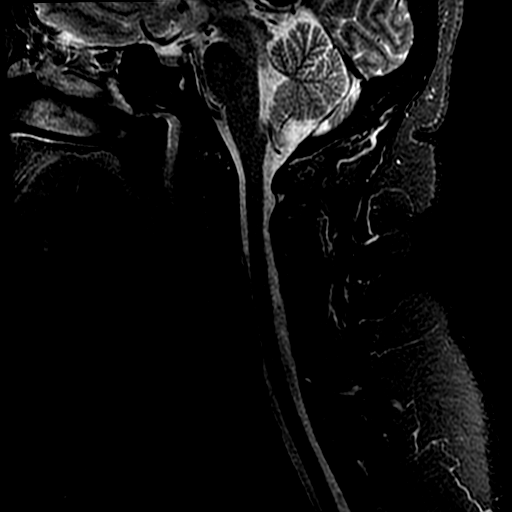
[im 12/12]
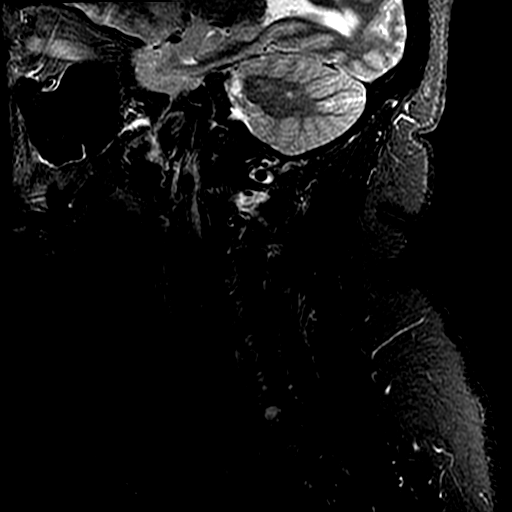

[Series 6: T2 · axial · 3.0mm · 0.39mm/px · z∈[-206,-123]mm · 7 of 29 slices shown (2 of 2)]
[im 1/29]
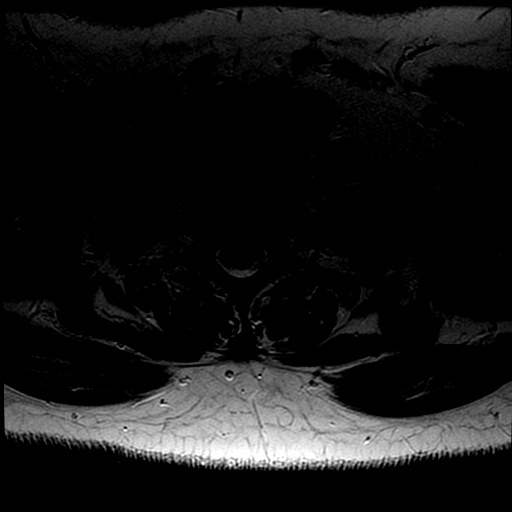
[im 5/29]
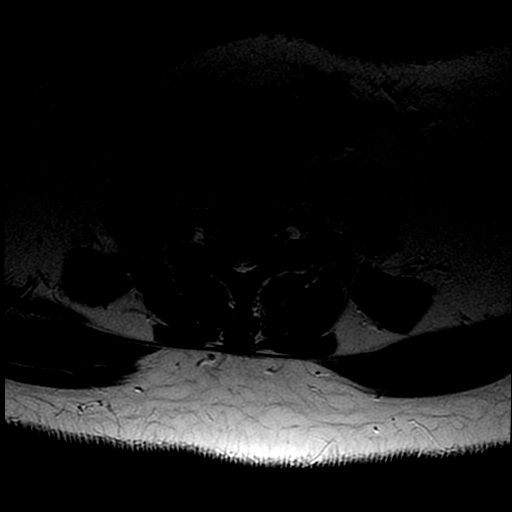
[im 9/29]
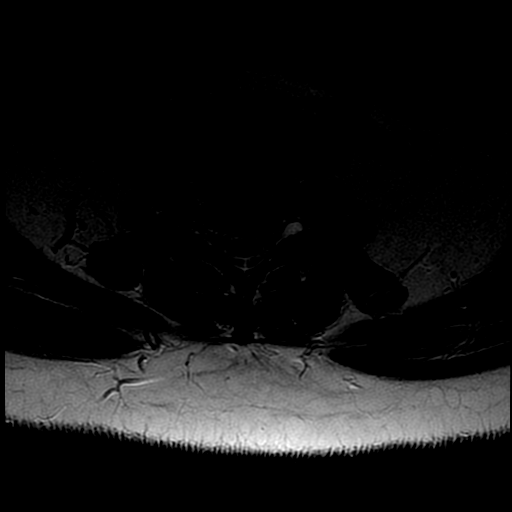
[im 13/29]
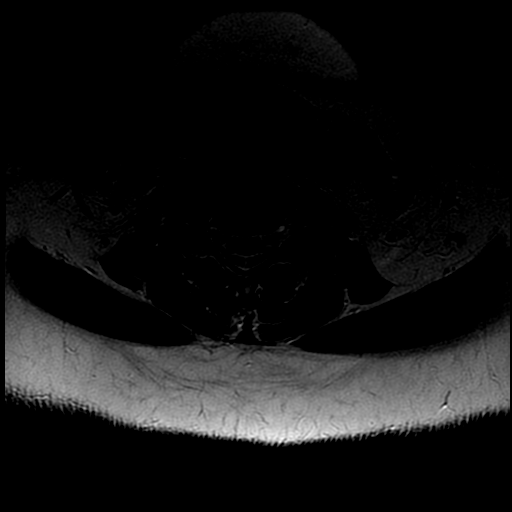
[im 15/29]
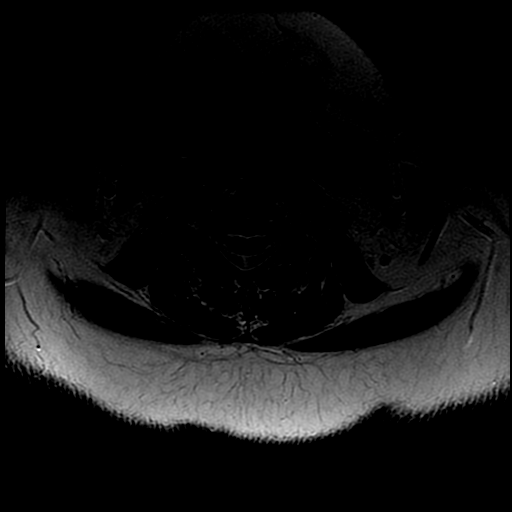
[im 17/29]
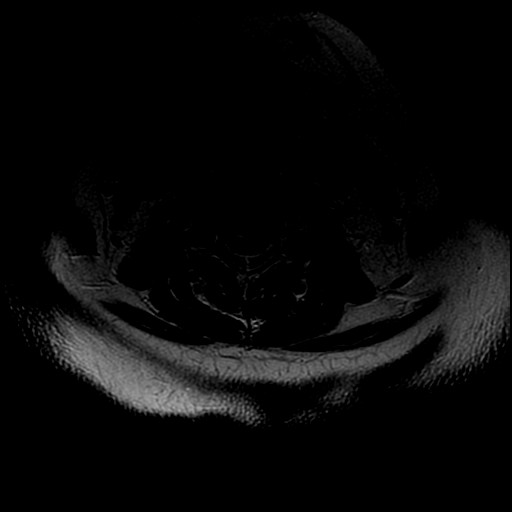
[im 25/29]
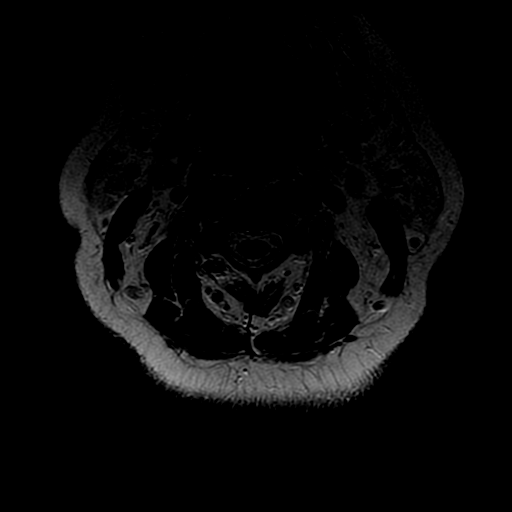

[19 of 48 positions shown; findings below may reference images not displayed]

FINDINGS: The visualized intracranial contents are normal except for a tiny
white matter signal abnormality in the right cerebellar hemisphere
consistent with small vessel ischemic disease as described on the
brain MRI of this same date.

Cervical spinal cord and paraspinal soft tissues are normal.

There is no cervical or foraminal stenosis. There is no facet
arthritis in the cervical spine.

C1-2 and C2-3:  Normal.

C3-4: Minimal uncinate spurs to the right with no neural
impingement.

C4-5:  Small broad-based disc bulge with no neural impingement.

C5-6:  Tiny central disc bulge.

C6-7:  Normal.

C7-T1: Tiny disc bulge central and to the left with no neural
impingement.

T1-2:  Normal.
IMPRESSION: No significant abnormality of the cervical spine.

## 2013-10-19 MED ORDER — CYCLOBENZAPRINE HCL 5 MG PO TABS
5.0000 mg | ORAL_TABLET | Freq: Three times a day (TID) | ORAL | Status: DC
  Administered 2013-10-19: 5 mg via ORAL
  Filled 2013-10-19 (×3): qty 1

## 2013-10-19 MED ORDER — MELOXICAM 7.5 MG PO TABS: 7.5000 mg | ORAL_TABLET | Freq: Every day | ORAL | Status: DC

## 2013-10-19 MED ORDER — MELOXICAM 7.5 MG PO TABS
7.5000 mg | ORAL_TABLET | Freq: Every day | ORAL | Status: DC
  Administered 2013-10-19: 7.5 mg via ORAL
  Filled 2013-10-19: qty 1

## 2013-10-19 MED ORDER — HYDROCODONE-ACETAMINOPHEN 5-325 MG PO TABS: 1.0000 | ORAL_TABLET | Freq: Four times a day (QID) | ORAL | Status: DC | PRN

## 2013-10-19 MED ORDER — CYCLOBENZAPRINE HCL 5 MG PO TABS: 5.0000 mg | ORAL_TABLET | Freq: Three times a day (TID) | ORAL | Status: DC

## 2013-10-19 MED ORDER — INSULIN ASPART 100 UNIT/ML ~~LOC~~ SOLN: 0.0000 [IU] | Freq: Every day | SUBCUTANEOUS | Status: DC

## 2013-10-19 MED ORDER — INSULIN ASPART 100 UNIT/ML ~~LOC~~ SOLN
0.0000 [IU] | Freq: Three times a day (TID) | SUBCUTANEOUS | Status: DC
  Administered 2013-10-19: 2 [IU] via SUBCUTANEOUS

## 2013-10-19 NOTE — Progress Notes (Signed)
Occupational Therapy Evaluation Patient Details Name: Diane [name] MRN: [id number] DOB: 05-14-1946 Today's Date: 10/19/2013 Time: 4098-1191 OT Time Calculation (min): 22 min  OT Assessment / Plan / Recommendation History of present illness 67 yo female with dm, htn and recent cardiac cath done one month ago was normal (initiated after an abnormal stress test showing ??reversible area of ischemia, after the cath the cardiologist told her she had a "false positive test") transferred here from [facility] center for chest pain concerns for acs.  Pt is complaining of sharp shooting pain from her shoulder down her arm that is sharp lasts for several minutes then her arm gets weak and tingling in her fingers.  This has happened several times over the last day.  No neck pain.  No fevers.  No headache.  No facial n/t.  No facial drooping or slurred speech. No pain worse with movement of left shoulder or elbow.  No recent trauma.  No associated sob/n/v.  Pain free now.   Clinical Impression   Pt c/o specific L shoulder pain with movement at times. Pt appears tender over supraspinatus and also reports pain in post aspect of shoulder. Pt with tight posterior capsule. Rec pt follow up wit outpt orthopedic MD to further assess possible L shoulder dysfunction.? Pt appears to be havnig muscular spasms at times from shoulder dysfunction. If pt developing adhesive capsulitis or bursitis/tendonitis. Pt will be able to [place] up with this in her [place].     OT Assessment  All further OT needs can be met in the next venue of care    Follow Up Recommendations  Other (comment) (TBD by ortho)    Barriers to Discharge      Equipment Recommendations  None recommended by OT    Recommendations for Other Services Other (comment) (orthopedic consult  - outpt)  Frequency       Precautions / Restrictions Precautions Precautions: None Restrictions Weight Bearing Restrictions: No   Pertinent Vitals/Pain no apparent  distress     ADL  Transfers/Ambulation Related to ADLs: mod I ADL Comments: mod I with all ADL    OT Diagnosis: Acute pain;Generalized weakness  OT Problem List: Decreased strength;Decreased range of motion;Impaired UE functional use;Pain OT Treatment Interventions:     OT Goals(Current goals can be found in the care plan section) Acute Rehab OT Goals Patient Stated Goal: to go home OT Goal Formulation:  (eval only)  Visit Information  Last OT Received On: 10/19/13 Assistance Needed: +1 History of Present Illness: 67 yo female with dm, htn and recent cardiac cath done one month ago was normal (initiated after an abnormal stress test showing ??reversible area of ischemia, after the cath the cardiologist told her she had a "false positive test") transferred here from [facility] center for chest pain concerns for acs.  Pt is complaining of sharp shooting pain from her shoulder down her arm that is sharp lasts for several minutes then her arm gets weak and tingling in her fingers.  This has happened several times over the last day.  No neck pain.  No fevers.  No headache.  No facial n/t.  No facial drooping or slurred speech. No pain worse with movement of left shoulder or elbow.  No recent trauma.  No associated sob/n/v.  Pain free now.       Prior Functioning     Home Living Family/patient expects to be discharged to:: Private residence Living Arrangements: Spouse/significant other Available Help at Discharge: Family Type of Home:  House Home Access: Level entry Home Layout: Two level Alternate Level Stairs-Rails: Left;Right Home Equipment: None Prior Function Level of Independence: Independent Communication Communication: No difficulties         Vision/Perception     Cognition  Cognition Arousal/Alertness: Lethargic;Suspect due to medications Behavior During Therapy: WFL for tasks assessed/performed Overall Cognitive Status: Within Functional Limits for tasks assessed     Extremity/Trunk Assessment Upper Extremity Assessment Upper Extremity Assessment: LUE deficits/detail LUE Deficits / Details: Pt c/o "tightness in upper arm". Pt positive for supraspinatus tendonitis. Tender over tendon. Also c/o tendernous posterior shoulder. Limited ROM in shoulder flex, ab and ER. PROM WFL Lower Extremity Assessment Lower Extremity Assessment: Overall WFL for tasks assessed Cervical / Trunk Assessment Cervical / Trunk Assessment: Normal     Mobility Transfers Transfers: Sit to Stand;Stand to Sit Sit to Stand: 7: Independent Stand to Sit: 7: Independent     Exercise     Balance     End of Session OT - End of Session Activity Tolerance: Patient tolerated treatment well Patient left: Other (comment) (with PT) Nurse Communication: Other (comment) (outpt needs)  GO Functional Assessment Tool Used: clinical judgemetn Functional Limitation: Self care Self Care Current Status (Z6109): At least 1 percent but less than 20 percent impaired, limited or restricted Self Care Goal Status (U0454): 0 percent impaired, limited or restricted Self Care Discharge Status [phone]): 0 percent impaired, limited or restricted   Derry Arbogast,HILLARY 10/19/2013, 3:18 PM [facility], OTR/L  [phone] 10/19/2013

## 2013-10-19 NOTE — Progress Notes (Signed)
I notified Dr. Isidoro Donning of PT/Ot recommendations. She stated it was ok to discharge home

## 2013-10-19 NOTE — Progress Notes (Signed)
Pt d/c'd to private vehicle with family. Assessment unchanged from this am.

## 2013-10-19 NOTE — Evaluation (Signed)
Physical Therapy Evaluation Patient Details Name: Diane [name] MRN: [id number] DOB: [birth year] Today's Date: 10/19/2013 Time: 2130-8657 PT Time Calculation (min): 13 min  PT Assessment / Plan / Recommendation History of Present Illness  67 yo female with dm, htn and recent cardiac cath done one month ago was normal (initiated after an abnormal stress test showing ??reversible area of ischemia, after the cath the cardiologist told her she had a "false positive test") transferred here from [facility] center for chest pain concerns for acs.  Pt is complaining of sharp shooting pain from her shoulder down her arm that is sharp lasts for several minutes then her arm gets weak and tingling in her fingers.  This has happened several times over the last day.  No neck pain.  No fevers.  No headache.  No facial n/t.  No facial drooping or slurred speech. No pain worse with movement of left shoulder or elbow.  No recent trauma.  No associated sob/n/v.  Pain free now.  Clinical Impression  Pt is independent/mod I with all mobility and gait.  L shoulder ROM limited will benefit from orthopedist follow up.  No further acute PT needs at this time.    PT Assessment  Patent does not need any further PT services    Follow Up Recommendations  No PT follow up    Does the patient have the potential to tolerate intense rehabilitation      Barriers to Discharge        Equipment Recommendations  None recommended by PT    Recommendations for Other Services     Frequency      Precautions / Restrictions Restrictions Weight Bearing Restrictions: No   Pertinent Vitals/Pain No c/o pain at this time.      Mobility  Transfers Transfers: Sit to Stand;Stand to Sit Sit to Stand: 7: Independent Stand to Sit: 7: Independent Ambulation/Gait Ambulation/Gait Assistance: 6: Modified independent (Device/Increase time) Ambulation Distance (Feet): 150 Feet Ambulation/Gait Assistance Details: slow cadence, no LOB, no  AD Stairs: Yes Stairs Assistance: 6: Modified independent (Device/Increase time) Stair Management Technique: One rail Right Number of Stairs: 12    Exercises     PT Diagnosis:    PT Problem List:   PT Treatment Interventions:       PT Goals(Current goals can be found in the care plan section) Acute Rehab PT Goals PT Goal Formulation: No goals set, d/c therapy  Visit Information  Last PT Received On: 10/19/13 Assistance Needed: +1 History of Present Illness: 67 yo female with dm, htn and recent cardiac cath done one month ago was normal (initiated after an abnormal stress test showing ??reversible area of ischemia, after the cath the cardiologist told her she had a "false positive test") transferred here from [facility] center for chest pain concerns for acs.  Pt is complaining of sharp shooting pain from her shoulder down her arm that is sharp lasts for several minutes then her arm gets weak and tingling in her fingers.  This has happened several times over the last day.  No neck pain.  No fevers.  No headache.  No facial n/t.  No facial drooping or slurred speech. No pain worse with movement of left shoulder or elbow.  No recent trauma.  No associated sob/n/v.  Pain free now.       Prior Functioning  Home Living Family/patient expects to be discharged to:: Private residence Living Arrangements: Spouse/significant other Available Help at Discharge: Family Type of Home: House Home Access:  Level entry Home Layout: Two level Alternate Level Stairs-Rails: Left;Right Home Equipment: None Prior Function Level of Independence: Independent Communication Communication: No difficulties    Cognition  Cognition Arousal/Alertness: Awake/alert Behavior During Therapy: WFL for tasks assessed/performed Overall Cognitive Status: Within Functional Limits for tasks assessed    Extremity/Trunk Assessment Lower Extremity Assessment Lower Extremity Assessment: Generalized weakness Cervical / Trunk  Assessment Cervical / Trunk Assessment: Normal   Balance    End of Session PT - End of Session Activity Tolerance: Patient tolerated treatment well Patient left: in bed;with call bell/phone within reach Nurse Communication: Mobility status  GP Functional Assessment Tool Used: clincal judgement Functional Limitation: Mobility: Walking and moving around Mobility: Walking and Moving Around Current Status ([id number]): At least 1 percent but less than 20 percent impaired, limited or restricted Mobility: Walking and Moving Around Goal Status [phone]): At least 1 percent but less than 20 percent impaired, limited or restricted Mobility: Walking and Moving Around Discharge Status [phone]): At least 1 percent but less than 20 percent impaired, limited or restricted   [facility] 10/19/2013, 2:04 PM

## 2013-10-19 NOTE — Discharge Summary (Signed)
Physician Discharge Summary  Patient ID: Diane [name] MRN: [id number] DOB/AGE: Jul 05, 1946 67 y.o.  Admit date: 10/18/2013 Discharge date: 10/19/2013  Primary Care Physician:  Juline Patch, MD  Discharge Diagnoses:   Left arm numbness and pain . Hypertension . Diabetes   Consults:  None   Recommendations for Outpatient Follow-up:  Patient will likely benefit from outpatient orthopedics evaluation  Allergies:   Allergies  Allergen Reactions  . Cephalexin Other (See Comments)    "blocked my urine"  . Codeine Rash     Discharge Medications:   Medication List         aspirin 81 MG chewable tablet  Chew 81 mg by mouth daily.     cyclobenzaprine 5 MG tablet  Commonly known as:  FLEXERIL  Take 1 tablet (5 mg total) by mouth 3 (three) times daily. X 10days, then as needed     HYDROcodone-acetaminophen 5-325 MG per tablet  Commonly known as:  NORCO/VICODIN  Take 1 tablet by mouth every 6 (six) hours as needed for moderate pain.     meloxicam 7.5 MG tablet  Commonly known as:  MOBIC  Take 1 tablet (7.5 mg total) by mouth daily.     metFORMIN 500 MG tablet  Commonly known as:  GLUCOPHAGE  Take 500 mg by mouth daily with breakfast.     omeprazole 20 MG capsule  Commonly known as:  PRILOSEC  Take 20 mg by mouth daily.     TWYNSTA 80-5 MG Tabs  Generic drug:  Telmisartan-Amlodipine  Take 1 tablet by mouth daily.         Brief H and P: For complete details please refer to admission H and P, but in brief patient is a 67 year old female with diabetes, hypertension, recent cardiac cath done one month ago at [place] which was normal and presented from that center [facility] for chest pain concerning for ACS. Patient however reported as sharp shooting pain from her shoulder down her arm then I'm getting weak and tingling in her fingers, happened several times over the last days. No neck pain however she reported that during the hospitalization to me that she  occasionally has neck pain. No recent trauma. Patient was admitted for further workup.  Hospital Course:  Chest pain: Patient actually did not report any chest pain she reported left shoulder/arm pain. She also reported that she was in [place] with her daughter when she underwent a stress test which was slightly abnormal hence had a cardiac catheterization in [place] last month which was completely normal without any obstructive coronary disease. Troponins were negative.  Left arm pain with numbness: Likely musculoskeletal, Initially there was a concern for possibility of stroke and MRI of the brain was done, which showed moderately severe chronic white matter changes with significant progression since 2005 but no acute infarct. MRI of the C-spine was done which showed small broad-based disc bulge at C4-C5 and tiny disc bulge at C7-T1 but no new impingement in both. Patient was placed on Flexeril, Mobic and pain control. Occupational therapy was consulted for recommendations regarding improving strength in the left arm.   Day of Discharge BP 135/64  Pulse 76  Temp(Src) 97.6 F (36.4 C) (Oral)  Resp 18  Ht 5\' 8"  (1.727 m)  Wt 104.327 kg (230 lb)  BMI 34.98 kg/m2  SpO2 97%  Physical Exam: General: Alert and awake oriented x3 not in any acute distress. CVS: S1-S2 clear no murmur rubs or gallops Chest: clear to auscultation bilaterally,  no wheezing rales or rhonchi Abdomen: soft nontender, nondistended, normal bowel sounds Extremities: no cyanosis, clubbing or edema noted bilaterally Range of movement normal in the shoulder. Mild left arm weakness with grip.   The results of significant diagnostics from this hospitalization (including imaging, microbiology, ancillary and laboratory) are listed below for reference.    LAB RESULTS: Basic Metabolic Panel:  Recent Labs Lab 10/18/13 1630  NA 142  K 3.9  CL 107  CO2 25  GLUCOSE 103*  BUN 16  CREATININE 0.80  CALCIUM 9.1   Liver  Function Tests:  Recent Labs Lab 10/18/13 1630  AST 14  ALT 15  ALKPHOS 78  BILITOT 0.2*  PROT 7.0  ALBUMIN 3.5   No results found for this basename: LIPASE, AMYLASE,  in the last 168 hours No results found for this basename: AMMONIA,  in the last 168 hours CBC:  Recent Labs Lab 10/18/13 1630  WBC 5.9  NEUTROABS 3.4  HGB 12.9  HCT 38.4  MCV 90.8  PLT 211   Cardiac Enzymes:  Recent Labs Lab 10/18/13 1630 10/19/13 0615  TROPONINI <0.30 <0.30   BNP: No components found with this basename: POCBNP,  CBG:  Recent Labs Lab 10/19/13 0956 10/19/13 1134  GLUCAP 176* 100*    Significant Diagnostic Studies:  Dg Chest 2 View  10/18/2013   CLINICAL DATA:  Chest pain for 3 months  EXAM: CHEST  2 VIEW  COMPARISON:  12/11/2011  FINDINGS: Cardiomediastinal silhouette is stable. No acute infiltrate or pleural effusion. No pulmonary edema. Stable mild degenerative changes thoracic spine.  IMPRESSION: No active cardiopulmonary disease.   Electronically Signed   By: Natasha Mead M.D.   On: 10/18/2013 16:38   Mr Brain Wo Contrast  10/19/2013   CLINICAL DATA:  Left arm weakness.  Rule out stroke  EXAM: MRI HEAD WITHOUT CONTRAST  TECHNIQUE: Multiplanar, multiecho pulse sequences of the brain and surrounding structures were obtained without intravenous contrast.  COMPARISON:  CT 12/23/2009.  MRI 04/16/2004  FINDINGS: Multiple small areas of hyperintensity in the cerebral white matter bilaterally. This has progressed significantly since 2005. These are probably in the deep and subcortical white matter. The brainstem and cerebellum are intact.  Diffusion-weighted imaging is negative for acute infarct. Negative for hemorrhage or mass.  Ventricle size is normal. No midline shift. Vessels of the base of brain are patent.  Paranasal sinuses are clear.  IMPRESSION: Moderately severe chronic white matter changes with significant progression since 2005. This pattern is most consistent with chronic  microvascular ischemia, especially in this patient with hypertension and diabetes.  No acute infarct.   Electronically Signed   By: Marlan Palau M.D.   On: 10/19/2013 11:20   Mr Cervical Spine Wo Contrast  10/19/2013   CLINICAL DATA:  Neck pain. Left arm weakness with numbness in the fingers.  EXAM: MRI CERVICAL SPINE WITHOUT CONTRAST  TECHNIQUE: Multiplanar, multisequence MR imaging was performed. No intravenous contrast was administered.  COMPARISON:  Cervical spine radiographs dated 02/21/2011  FINDINGS: The visualized intracranial contents are normal except for a tiny white matter signal abnormality in the right cerebellar hemisphere consistent with small vessel ischemic disease as described on the brain MRI of this same date.  Cervical spinal cord and paraspinal soft tissues are normal.  There is no cervical or foraminal stenosis. There is no facet arthritis in the cervical spine.  C1-2 and C2-3:  Normal.  C3-4: Minimal uncinate spurs to the right with no neural impingement.  C4-5:  Small broad-based disc bulge with no neural impingement.  C5-6:  Tiny central disc bulge.  C6-7:  Normal.  C7-T1: Tiny disc bulge central and to the left with no neural impingement.  T1-2:  Normal.  IMPRESSION: No significant abnormality of the cervical spine.   Electronically Signed   By: Geanie Cooley M.D.   On: 10/19/2013 11:33     Disposition and Follow-up:     Discharge Orders   Future Orders Complete By Expires   Diet - low sodium heart healthy  As directed    Increase activity slowly  As directed        DISPOSITION: Home DIET: Carb modified diet  DISCHARGE FOLLOW-UP Follow-up Information   Follow up with PANG,RICHARD, MD. Schedule an appointment as soon as possible for a visit in 2 weeks. (For hospital followup)    Specialty:  Internal Medicine   Contact information:   [address] [place] [address] [phone]       Time spent on Discharge: 40  minutes  Signed:   Amarya Kuehl M.D. [facility] 10/19/2013, 12:26 PM Pager: [phone]

## 2013-11-07 DIAGNOSIS — R609 Edema, unspecified: Secondary | ICD-10-CM | POA: Diagnosis not present

## 2013-11-07 DIAGNOSIS — I10 Essential (primary) hypertension: Secondary | ICD-10-CM | POA: Diagnosis not present

## 2013-11-07 DIAGNOSIS — M79609 Pain in unspecified limb: Secondary | ICD-10-CM | POA: Diagnosis not present

## 2013-11-14 DIAGNOSIS — R609 Edema, unspecified: Secondary | ICD-10-CM | POA: Diagnosis not present

## 2013-11-14 DIAGNOSIS — IMO0001 Reserved for inherently not codable concepts without codable children: Secondary | ICD-10-CM | POA: Diagnosis not present

## 2013-11-14 DIAGNOSIS — Z23 Encounter for immunization: Secondary | ICD-10-CM | POA: Diagnosis not present

## 2013-12-05 DIAGNOSIS — M79609 Pain in unspecified limb: Secondary | ICD-10-CM | POA: Diagnosis not present

## 2013-12-05 DIAGNOSIS — M7150 Other bursitis, not elsewhere classified, unspecified site: Secondary | ICD-10-CM | POA: Diagnosis not present

## 2013-12-05 DIAGNOSIS — M722 Plantar fascial fibromatosis: Secondary | ICD-10-CM | POA: Diagnosis not present

## 2013-12-05 DIAGNOSIS — M7730 Calcaneal spur, unspecified foot: Secondary | ICD-10-CM | POA: Diagnosis not present

## 2013-12-12 DIAGNOSIS — L988 Other specified disorders of the skin and subcutaneous tissue: Secondary | ICD-10-CM | POA: Diagnosis not present

## 2014-01-02 DIAGNOSIS — M67919 Unspecified disorder of synovium and tendon, unspecified shoulder: Secondary | ICD-10-CM | POA: Diagnosis not present

## 2014-01-02 DIAGNOSIS — M62838 Other muscle spasm: Secondary | ICD-10-CM | POA: Diagnosis not present

## 2014-02-11 DIAGNOSIS — E119 Type 2 diabetes mellitus without complications: Secondary | ICD-10-CM | POA: Diagnosis not present

## 2014-02-11 DIAGNOSIS — M79609 Pain in unspecified limb: Secondary | ICD-10-CM | POA: Diagnosis not present

## 2014-02-11 DIAGNOSIS — M25519 Pain in unspecified shoulder: Secondary | ICD-10-CM | POA: Diagnosis not present

## 2014-02-11 DIAGNOSIS — M542 Cervicalgia: Secondary | ICD-10-CM | POA: Diagnosis not present

## 2014-02-11 DIAGNOSIS — E785 Hyperlipidemia, unspecified: Secondary | ICD-10-CM | POA: Diagnosis not present

## 2014-02-13 DIAGNOSIS — G542 Cervical root disorders, not elsewhere classified: Secondary | ICD-10-CM | POA: Diagnosis not present

## 2014-02-20 DIAGNOSIS — M5020 Other cervical disc displacement, unspecified cervical region: Secondary | ICD-10-CM | POA: Diagnosis not present

## 2014-02-20 DIAGNOSIS — M542 Cervicalgia: Secondary | ICD-10-CM | POA: Diagnosis not present

## 2014-02-26 DIAGNOSIS — H04129 Dry eye syndrome of unspecified lacrimal gland: Secondary | ICD-10-CM | POA: Diagnosis not present

## 2014-02-26 DIAGNOSIS — M5020 Other cervical disc displacement, unspecified cervical region: Secondary | ICD-10-CM | POA: Diagnosis not present

## 2014-02-26 DIAGNOSIS — H40039 Anatomical narrow angle, unspecified eye: Secondary | ICD-10-CM | POA: Diagnosis not present

## 2014-02-26 DIAGNOSIS — M5412 Radiculopathy, cervical region: Secondary | ICD-10-CM | POA: Diagnosis not present

## 2014-02-27 DIAGNOSIS — M5412 Radiculopathy, cervical region: Secondary | ICD-10-CM | POA: Diagnosis not present

## 2014-02-27 DIAGNOSIS — Z5181 Encounter for therapeutic drug level monitoring: Secondary | ICD-10-CM | POA: Diagnosis not present

## 2014-02-27 DIAGNOSIS — M5020 Other cervical disc displacement, unspecified cervical region: Secondary | ICD-10-CM | POA: Diagnosis not present

## 2014-02-27 DIAGNOSIS — Z79899 Other long term (current) drug therapy: Secondary | ICD-10-CM | POA: Diagnosis not present

## 2014-03-04 DIAGNOSIS — M5020 Other cervical disc displacement, unspecified cervical region: Secondary | ICD-10-CM | POA: Diagnosis not present

## 2014-03-04 DIAGNOSIS — M5412 Radiculopathy, cervical region: Secondary | ICD-10-CM | POA: Diagnosis not present

## 2014-03-05 DIAGNOSIS — H40039 Anatomical narrow angle, unspecified eye: Secondary | ICD-10-CM | POA: Diagnosis not present

## 2014-03-19 DIAGNOSIS — M5020 Other cervical disc displacement, unspecified cervical region: Secondary | ICD-10-CM | POA: Diagnosis not present

## 2014-03-19 DIAGNOSIS — M5412 Radiculopathy, cervical region: Secondary | ICD-10-CM | POA: Diagnosis not present

## 2014-03-29 DIAGNOSIS — M5126 Other intervertebral disc displacement, lumbar region: Secondary | ICD-10-CM | POA: Diagnosis not present

## 2014-04-01 DIAGNOSIS — M5126 Other intervertebral disc displacement, lumbar region: Secondary | ICD-10-CM | POA: Diagnosis not present

## 2014-04-03 DIAGNOSIS — M5126 Other intervertebral disc displacement, lumbar region: Secondary | ICD-10-CM | POA: Diagnosis not present

## 2014-04-05 DIAGNOSIS — M5020 Other cervical disc displacement, unspecified cervical region: Secondary | ICD-10-CM | POA: Diagnosis not present

## 2014-04-05 DIAGNOSIS — M5412 Radiculopathy, cervical region: Secondary | ICD-10-CM | POA: Diagnosis not present

## 2014-06-15 ENCOUNTER — Observation Stay (HOSPITAL_COMMUNITY)
Admission: EM | Admit: 2014-06-15 | Discharge: 2014-06-16 | Disposition: A | Discharge status: Home / Self Care | Payer: Medicare Other | Attending: Internal Medicine | Admitting: Internal Medicine

## 2014-06-15 ENCOUNTER — Encounter (HOSPITAL_COMMUNITY): Payer: Self-pay | Admitting: Emergency Medicine

## 2014-06-15 ENCOUNTER — Emergency Department (HOSPITAL_COMMUNITY): Payer: Medicare Other

## 2014-06-15 DIAGNOSIS — E119 Type 2 diabetes mellitus without complications: Secondary | ICD-10-CM | POA: Diagnosis not present

## 2014-06-15 DIAGNOSIS — Z7982 Long term (current) use of aspirin: Secondary | ICD-10-CM | POA: Diagnosis not present

## 2014-06-15 DIAGNOSIS — I10 Essential (primary) hypertension: Secondary | ICD-10-CM | POA: Diagnosis not present

## 2014-06-15 DIAGNOSIS — R079 Chest pain, unspecified: Secondary | ICD-10-CM | POA: Diagnosis not present

## 2014-06-15 DIAGNOSIS — M7989 Other specified soft tissue disorders: Secondary | ICD-10-CM

## 2014-06-15 DIAGNOSIS — Z79899 Other long term (current) drug therapy: Secondary | ICD-10-CM | POA: Insufficient documentation

## 2014-06-15 LAB — GLUCOSE, CAPILLARY
GLUCOSE-CAPILLARY: 192 mg/dL — AB (ref 70–99)
GLUCOSE-CAPILLARY: 135 mg/dL — AB (ref 70–99)
Glucose-Capillary: 151 mg/dL — ABNORMAL HIGH (ref 70–99)

## 2014-06-15 LAB — CBC WITH DIFFERENTIAL/PLATELET
Basophils Absolute: 0 10*3/uL (ref 0.0–0.1)
Basophils Relative: 0 % (ref 0–1)
EOS PCT: 2 % (ref 0–5)
Eosinophils Absolute: 0.1 10*3/uL (ref 0.0–0.7)
HEMATOCRIT: 38.8 % (ref 36.0–46.0)
Hemoglobin: 12.8 g/dL (ref 12.0–15.0)
LYMPHS PCT: 33 % (ref 12–46)
Lymphs Abs: 1.9 10*3/uL (ref 0.7–4.0)
MCH: 30.1 pg (ref 26.0–34.0)
MCHC: 33 g/dL (ref 30.0–36.0)
MCV: 91.3 fL (ref 78.0–100.0)
MONO ABS: 0.5 10*3/uL (ref 0.1–1.0)
Monocytes Relative: 9 % (ref 3–12)
Neutro Abs: 3.3 10*3/uL (ref 1.7–7.7)
Neutrophils Relative %: 56 % (ref 43–77)
Platelets: 232 10*3/uL (ref 150–400)
RBC: 4.25 MIL/uL (ref 3.87–5.11)
RDW: 14.5 % (ref 11.5–15.5)
WBC: 5.9 10*3/uL (ref 4.0–10.5)

## 2014-06-15 LAB — URINALYSIS, ROUTINE W REFLEX MICROSCOPIC
BILIRUBIN URINE: NEGATIVE
GLUCOSE, UA: NEGATIVE mg/dL
Hgb urine dipstick: NEGATIVE
Ketones, ur: NEGATIVE mg/dL
Leukocytes, UA: NEGATIVE
Nitrite: NEGATIVE
PROTEIN: NEGATIVE mg/dL
Specific Gravity, Urine: 1.015 (ref 1.005–1.030)
UROBILINOGEN UA: 0.2 mg/dL (ref 0.0–1.0)
pH: 6 (ref 5.0–8.0)

## 2014-06-15 LAB — CREATININE, SERUM: Creatinine, Ser: 0.52 mg/dL (ref 0.50–1.10)

## 2014-06-15 LAB — TROPONIN I
Troponin I: <0.3 ng/mL (ref <0.30)
Troponin I: <0.3 ng/mL (ref <0.30)

## 2014-06-15 LAB — CBC
HEMATOCRIT: 37.4 % (ref 36.0–46.0)
Hemoglobin: 12.1 g/dL (ref 12.0–15.0)
MCH: 29.7 pg (ref 26.0–34.0)
MCHC: 32.4 g/dL (ref 30.0–36.0)
MCV: 91.7 fL (ref 78.0–100.0)
Platelets: 217 10*3/uL (ref 150–400)
RBC: 4.08 MIL/uL (ref 3.87–5.11)
RDW: 14.3 % (ref 11.5–15.5)
WBC: 5 10*3/uL (ref 4.0–10.5)

## 2014-06-15 LAB — BASIC METABOLIC PANEL
Anion gap: 11 (ref 5–15)
BUN: 15 mg/dL (ref 6–23)
CO2: 25 meq/L (ref 19–32)
Calcium: 9.3 mg/dL (ref 8.4–10.5)
Chloride: 106 mEq/L (ref 96–112)
Creatinine, Ser: 0.6 mg/dL (ref 0.50–1.10)
GFR calc Af Amer: >90 mL/min (ref >90)
GFR calc non Af Amer: >90 mL/min (ref >90)
GLUCOSE: 148 mg/dL — AB (ref 70–99)
POTASSIUM: 3.9 meq/L (ref 3.7–5.3)
SODIUM: 142 meq/L (ref 137–147)

## 2014-06-15 LAB — I-STAT TROPONIN, ED: Troponin i, poc: 0 ng/mL (ref 0.00–0.08)

## 2014-06-15 IMAGING — CR DG CHEST 2V
2 series · 2 of 2 positions shown · non-contrast
Comparison: 10/18/2013

CLINICAL DATA: Left-sided chest pain for 2 days.

EXAM:
CHEST  2 VIEW

[w chest pa]
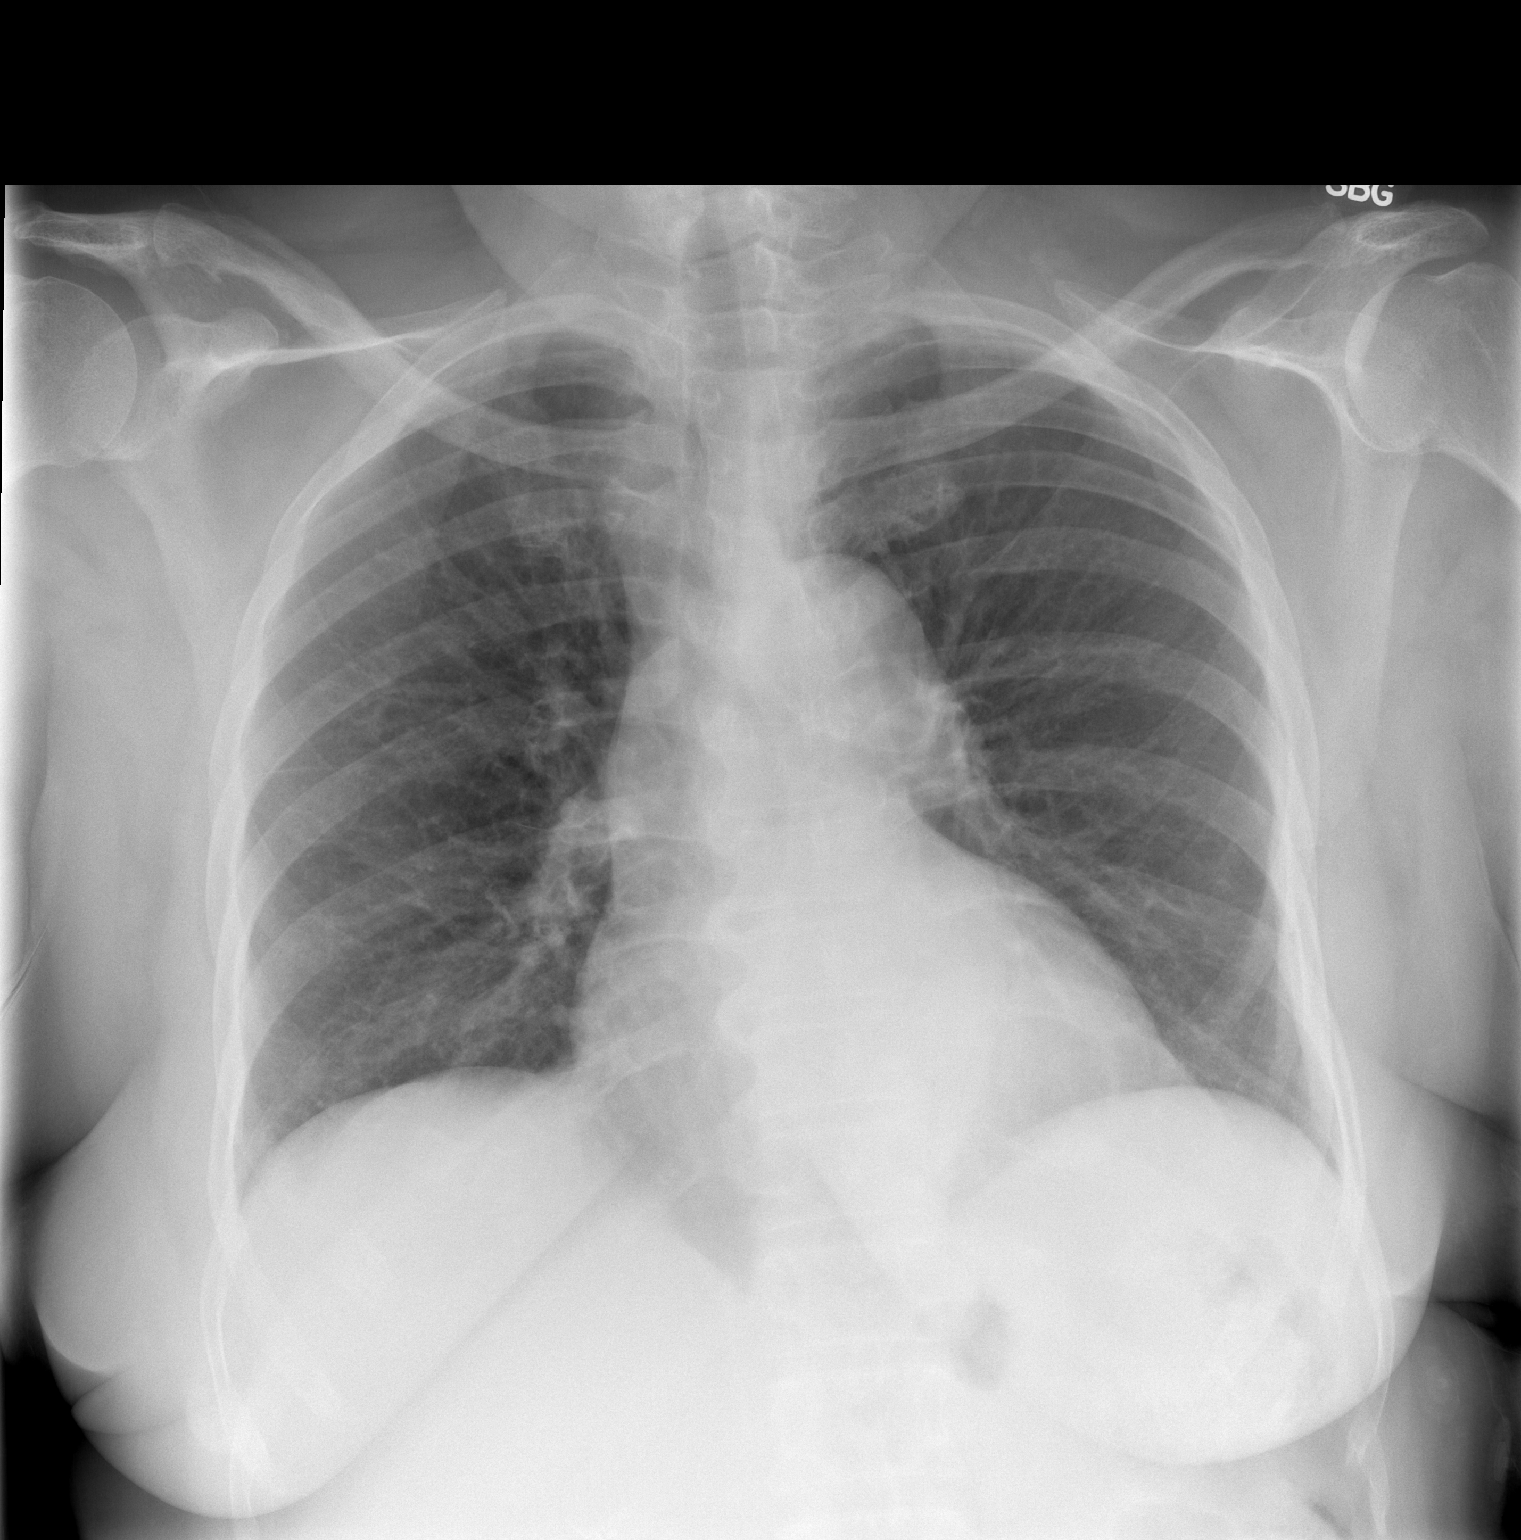

[w chest lat]
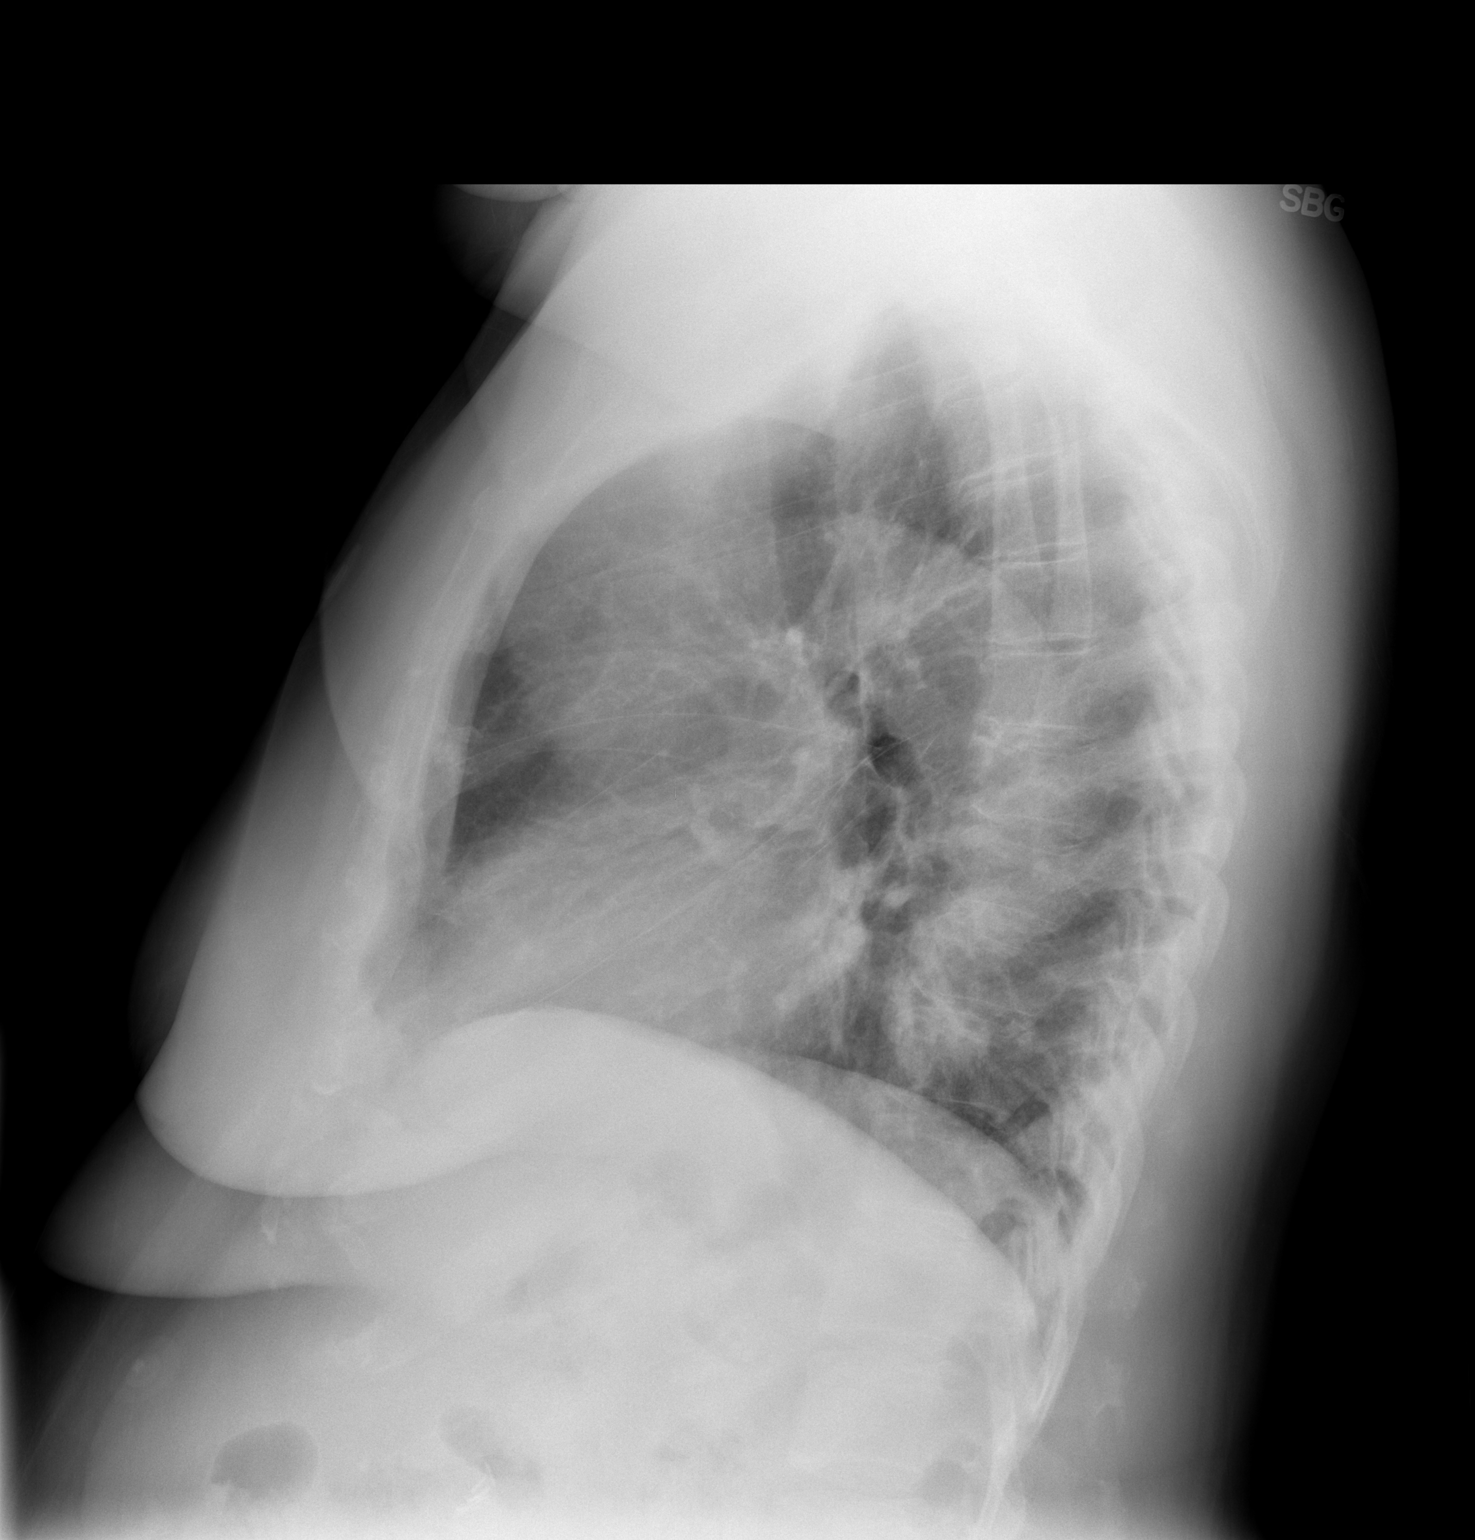

[2 of 2 positions shown; findings below may reference images not displayed]

FINDINGS: Cardiac silhouette is normal in size. Aorta is uncoiled. No
mediastinal or hilar masses. No evidence of adenopathy.

Clear lungs.  No pleural effusion or pneumothorax.

The bony thorax is intact.
IMPRESSION: No active cardiopulmonary disease.

## 2014-06-15 MED ORDER — GI COCKTAIL ~~LOC~~
30.0000 mL | Freq: Four times a day (QID) | ORAL | Status: DC | PRN
  Administered 2014-06-15: 30 mL via ORAL
  Filled 2014-06-15: qty 30

## 2014-06-15 MED ORDER — ACETAMINOPHEN 325 MG PO TABS: 650.0000 mg | ORAL_TABLET | ORAL | Status: DC | PRN

## 2014-06-15 MED ORDER — TELMISARTAN-AMLODIPINE 80-5 MG PO TABS: 1.0000 | ORAL_TABLET | Freq: Every day | ORAL | Status: DC

## 2014-06-15 MED ORDER — MORPHINE SULFATE 4 MG/ML IJ SOLN: 4.0000 mg | Freq: Once | INTRAMUSCULAR | Status: DC

## 2014-06-15 MED ORDER — ALPRAZOLAM 0.25 MG PO TABS: 0.2500 mg | ORAL_TABLET | Freq: Two times a day (BID) | ORAL | Status: DC | PRN

## 2014-06-15 MED ORDER — MORPHINE SULFATE 2 MG/ML IJ SOLN: 2.0000 mg | INTRAMUSCULAR | Status: DC | PRN

## 2014-06-15 MED ORDER — IRBESARTAN 300 MG PO TABS
300.0000 mg | ORAL_TABLET | Freq: Every day | ORAL | Status: DC
  Administered 2014-06-15 – 2014-06-16 (×2): 300 mg via ORAL
  Filled 2014-06-15 (×2): qty 1

## 2014-06-15 MED ORDER — KETOROLAC TROMETHAMINE 30 MG/ML IJ SOLN
30.0000 mg | Freq: Three times a day (TID) | INTRAMUSCULAR | Status: DC
  Administered 2014-06-15 – 2014-06-16 (×4): 30 mg via INTRAVENOUS
  Filled 2014-06-15 (×6): qty 1

## 2014-06-15 MED ORDER — INSULIN ASPART 100 UNIT/ML ~~LOC~~ SOLN
0.0000 [IU] | Freq: Three times a day (TID) | SUBCUTANEOUS | Status: DC
  Administered 2014-06-16: 1 [IU] via SUBCUTANEOUS
  Administered 2014-06-16: 2 [IU] via SUBCUTANEOUS

## 2014-06-15 MED ORDER — ASPIRIN EC 325 MG PO TBEC
325.0000 mg | DELAYED_RELEASE_TABLET | Freq: Every day | ORAL | Status: DC
  Administered 2014-06-15 – 2014-06-16 (×2): 325 mg via ORAL
  Filled 2014-06-15 (×2): qty 1

## 2014-06-15 MED ORDER — AMLODIPINE BESYLATE 5 MG PO TABS
5.0000 mg | ORAL_TABLET | Freq: Every day | ORAL | Status: DC
  Administered 2014-06-15 – 2014-06-16 (×2): 5 mg via ORAL
  Filled 2014-06-15 (×2): qty 1

## 2014-06-15 MED ORDER — ASPIRIN 81 MG PO CHEW
324.0000 mg | CHEWABLE_TABLET | Freq: Once | ORAL | Status: AC
  Administered 2014-06-15: 324 mg via ORAL
  Filled 2014-06-15: qty 4

## 2014-06-15 MED ORDER — ZOLPIDEM TARTRATE 5 MG PO TABS
5.0000 mg | ORAL_TABLET | Freq: Every evening | ORAL | Status: DC | PRN
Start: 2014-06-15 — End: 2014-06-16

## 2014-06-15 MED ORDER — NITROGLYCERIN 0.4 MG SL SUBL
0.4000 mg | SUBLINGUAL_TABLET | SUBLINGUAL | Status: AC | PRN
  Administered 2014-06-15 (×3): 0.4 mg via SUBLINGUAL
  Filled 2014-06-15: qty 1

## 2014-06-15 MED ORDER — ENOXAPARIN SODIUM 40 MG/0.4ML ~~LOC~~ SOLN
40.0000 mg | SUBCUTANEOUS | Status: DC
  Administered 2014-06-15 – 2014-06-16 (×2): 40 mg via SUBCUTANEOUS
  Filled 2014-06-15 (×2): qty 0.4

## 2014-06-15 MED ORDER — NITROGLYCERIN 0.4 MG SL SUBL: 0.4000 mg | SUBLINGUAL_TABLET | SUBLINGUAL | Status: DC | PRN

## 2014-06-15 MED ORDER — ALBUTEROL SULFATE (2.5 MG/3ML) 0.083% IN NEBU: 2.5000 mg | INHALATION_SOLUTION | RESPIRATORY_TRACT | Status: DC | PRN

## 2014-06-15 MED ORDER — ONDANSETRON HCL 4 MG/2ML IJ SOLN: 4.0000 mg | Freq: Four times a day (QID) | INTRAMUSCULAR | Status: DC | PRN

## 2014-06-15 NOTE — ED Provider Notes (Signed)
CSN: [id number]     Arrival date & time 06/15/14  4166 History   First MD Initiated Contact with Patient 06/15/14 0602     Chief Complaint  Patient presents with  . Chest Pain     (Consider location/radiation/quality/duration/timing/severity/associated sxs/prior Treatment) HPI Comments: Patient is a 68 yo F PMHx significant for HTN, DM presenting to the ED for substernal sharp chest pain with radiation into left chest that awoke the patient at 3AM. Attempted to take Prilosec with no relief. Patient states she had some reflux like symptoms for two days prior, but this chest pain was different from her reflux symptoms. No SOB, diaphoresis, nausea, vomiting. Last echocardiogram last year. Last stress test October.   Patient is a 68 y.o. female presenting with chest pain.  Chest Pain   Past Medical History  Diagnosis Date  . Hypertension   . Diabetes mellitus    Past Surgical History  Procedure Laterality Date  . Abdominal hysterectomy    . Cholecystectomy    . Hernia repair     History reviewed. No pertinent family history. History  Substance Use Topics  . Smoking status: Never Smoker   . Smokeless tobacco: Not on file  . Alcohol Use: No   OB History   Grav Para Term Preterm Abortions TAB SAB Ect Mult Living                 Review of Systems  Cardiovascular: Positive for chest pain.  All other systems reviewed and are negative.     Allergies  Cephalexin and Codeine  Home Medications   Prior to Admission medications   Medication Sig Start Date End Date Taking? Authorizing Provider  aspirin 81 MG chewable tablet Chew 81 mg by mouth daily.   Yes Historical Provider, MD  metFORMIN (GLUCOPHAGE) 500 MG tablet Take 500 mg by mouth daily with breakfast.    Yes Historical Provider, MD  omeprazole (PRILOSEC) 20 MG capsule Take 20 mg by mouth daily.   Yes Historical Provider, MD  Telmisartan-Amlodipine (TWYNSTA) 80-5 MG TABS Take 1 tablet by mouth daily.   Yes Historical  Provider, MD   BP 134/76  Pulse 78  Temp(Src) 97.9 F (36.6 C) (Oral)  Resp 13  SpO2 89% Physical Exam  Nursing note and vitals reviewed. Constitutional: She is oriented to person, place, and time. She appears well-developed and well-nourished. No distress.  Uncomfortable appearing.   HENT:  Head: Normocephalic and atraumatic.  Right Ear: External ear normal.  Left Ear: External ear normal.  Nose: Nose normal.  Mouth/Throat: Oropharynx is clear and moist.  Eyes: Conjunctivae are normal.  Neck: Neck supple.  Cardiovascular: Normal rate, regular rhythm and normal heart sounds.   Pulmonary/Chest: Effort normal and breath sounds normal. No respiratory distress. She exhibits no tenderness.  Abdominal: Soft. Bowel sounds are normal. There is no tenderness.  Musculoskeletal: She exhibits no edema.  MAE x 4  Neurological: She is alert and oriented to person, place, and time.  Skin: Skin is warm and dry. She is not diaphoretic.    ED Course  Procedures (including critical care time) Medications  morphine 4 MG/ML injection 4 mg (not administered)  aspirin chewable tablet 324 mg (324 mg Oral Given 06/15/14 0637)  nitroGLYCERIN (NITROSTAT) SL tablet 0.4 mg (0.4 mg Sublingual Given 06/15/14 0650)    Labs Review Labs Reviewed  BASIC METABOLIC PANEL - Abnormal; Notable for the following:    Glucose, Bld 148 (*)    All other components within normal  limits  CBC WITH DIFFERENTIAL  URINALYSIS, ROUTINE W REFLEX MICROSCOPIC  I-STAT CHEM 8, ED  Randolm Idol, ED    Imaging Review Dg Chest 2 View  06/15/2014   CLINICAL DATA:  Left-sided chest pain for 2 days.  EXAM: CHEST  2 VIEW  COMPARISON:  10/18/2013  FINDINGS: Cardiac silhouette is normal in size. Aorta is uncoiled. No mediastinal or hilar masses. No evidence of adenopathy.  Clear lungs.  No pleural effusion or pneumothorax.  The bony thorax is intact.  IMPRESSION: No active cardiopulmonary disease.   Electronically Signed   By:  Lajean Manes M.D.   On: 06/15/2014 08:03     EKG Interpretation   Date/Time:  Saturday June 15 2014 04:38:37 EDT Ventricular Rate:  78 PR Interval:  150 QRS Duration: 84 QT Interval:  420 QTC Calculation: 478 R Axis:   35 Text Interpretation:  Normal sinus rhythm Normal ECG Confirmed by OTTER   MD, OLGA ([id number]) on 06/15/2014 5:43:51 AM      MDM   Final diagnoses:  Chest pain, unspecified chest pain type    Filed Vitals:   06/15/14 0654  BP: 134/76  Pulse: 78  Temp:   Resp: 13   Afebrile, NAD, non-toxic appearing, AAOx4.  Concern for cardiac etiology of Chest Pain. Hospitalist has been consulted and will see patient in the ED for likely admit. Pt does not meet criteria for CP protocol and a further evaluation is recommended. Pt has been re-evaluated prior to consult and VSS, NAD, heart RRR, pain 0/10, lungs CTAB. No acute abnormalities found on EKG and first round of cardiac enzymes negative. This case was discussed with Dr. Sharol Given who agrees with plan to admit.      Harlow Mares, PA-C 06/15/14 1104

## 2014-06-15 NOTE — ED Notes (Signed)
Woke up out of sleep for substernal cp. Been taking Prilosec.duration x 2 days.

## 2014-06-15 NOTE — H&P (Signed)
PATIENT DETAILS Name: Diane [name] Age: 68 y.o. Sex: female Date of Birth: 1946-06-30 Admit Date: 06/15/2014 NLG:[id number],HERDEYC, MD   CHIEF COMPLAINT:  Chest pain  HPI: CHARLA [name] is a 68 y.o. female with a Past Medical History of hypertension and diabetes who presents today with the above noted complaint. Per patient, approximately 2 days back, she developed pain in her right shoulder area along with some tenderness in her right and left side of the chest, she took Aleve yesterday and the pain in her right shoulder completely resolved. This morning, she woke up with left-sided chest pain, which she describes as pressure-like. Pain is worse when she takes a deep breath, also worse upon movement of her left arm. Pain is easily reproducible on tenderness. Patient claims that the pain was 10/10 at its worst this morning, and got relieved with nitroglycerin. She denies any nausea or vomiting, but did claim to have more shortness of breath and some mild diaphoresis this morning. She was brought to the emergency room for further evaluation, I was subsequently asked to admit this patient for further evaluation and treatment. Please note, patient claims that last year when she was in the ER, she had a negative stress test and a negative cardiac catheterization. There is no history of headache, fever, nausea, vomiting, diarrhea or abdominal pain. Patient claims to have chronic bilateral lower extremity edema.   ALLERGIES:   Allergies  Allergen Reactions  . Cephalexin Other (See Comments)    "blocked my urine"  . Codeine Rash    PAST MEDICAL HISTORY: Past Medical History  Diagnosis Date  . Hypertension   . Diabetes mellitus     PAST SURGICAL HISTORY: Past Surgical History  Procedure Laterality Date  . Abdominal hysterectomy    . Cholecystectomy    . Hernia repair      MEDICATIONS AT HOME: Prior to Admission medications   Medication Sig Start Date End Date Taking?  Authorizing Provider  aspirin 81 MG chewable tablet Chew 81 mg by mouth daily.   Yes Historical Provider, MD  metFORMIN (GLUCOPHAGE) 500 MG tablet Take 500 mg by mouth daily with breakfast.    Yes Historical Provider, MD  omeprazole (PRILOSEC) 20 MG capsule Take 20 mg by mouth daily.   Yes Historical Provider, MD  Telmisartan-Amlodipine (TWYNSTA) 80-5 MG TABS Take 1 tablet by mouth daily.   Yes Historical Provider, MD    FAMILY HISTORY: History reviewed. No pertinent family history.  SOCIAL HISTORY:  reports that she has never smoked. She does not have any smokeless tobacco history on file. She reports that she does not drink alcohol or use illicit drugs.  REVIEW OF SYSTEMS:  Constitutional:   No  weight loss, night sweats,  Fevers, chills, fatigue.  HEENT:    No headaches, Difficulty swallowing,Tooth/dental problems,Sore throat,  No sneezing, itching, ear ache, nasal congestion, post nasal drip,   Cardio-vascular: No  Orthopnea, PND, swelling in lower extremities, anasarca,    dizziness, palpitations  GI:  No heartburn, indigestion, abdominal pain, nausea, vomiting, diarrhea, change in       bowel habits, loss of appetite  Resp: No shortness of breath with exertion or at rest.  No excess mucus, no productive cough, No non-productive cough,  No coughing up of blood.No change in color of mucus.No wheezing.No chest wall deformity  Skin:  no rash or lesions.  GU:  no dysuria, change in color of urine, no urgency or frequency.  No  flank pain.  Musculoskeletal: No joint pain or swelling.  No decreased range of motion.  No back pain.  Psych: No change in mood or affect. No depression or anxiety.  No memory loss.   PHYSICAL EXAM: Blood pressure 137/79, pulse 65, temperature 97.9 F (36.6 C), temperature source Oral, resp. rate 19, SpO2 94.00%.  General appearance :Awake, alert, not in any distress. Speech Clear. Not toxic Looking HEENT: Atraumatic and Normocephalic, pupils  equally reactive to light and accomodation Neck: supple, no JVD. No cervical lymphadenopathy.  Chest:Good air entry bilaterally, no added sounds. Tenderness on palpation on the left parasternal border and mid left chest area. CVS: S1 S2 regular, no murmurs.  Abdomen: Bowel sounds present, Non tender and not distended with no gaurding, rigidity or rebound. Extremities: B/L Lower Ext shows 1+ edema, both legs are warm to touch Neurology: Awake alert, and oriented X 3, CN II-XII intact, Non focal Skin:No Rash Wounds:N/A  LABS ON ADMISSION:   Recent Labs  06/15/14 0556  NA 142  K 3.9  CL 106  CO2 25  GLUCOSE 148*  BUN 15  CREATININE 0.60  CALCIUM 9.3   No results found for this basename: AST, ALT, ALKPHOS, BILITOT, PROT, ALBUMIN,  in the last 72 hours No results found for this basename: LIPASE, AMYLASE,  in the last 72 hours  Recent Labs  06/15/14 0524  WBC 5.9  NEUTROABS 3.3  HGB 12.8  HCT 38.8  MCV 91.3  PLT 232   No results found for this basename: CKTOTAL, CKMB, CKMBINDEX, TROPONINI,  in the last 72 hours No results found for this basename: DDIMER,  in the last 72 hours No components found with this basename: POCBNP,    RADIOLOGIC STUDIES ON ADMISSION: Dg Chest 2 View  06/15/2014   CLINICAL DATA:  Left-sided chest pain for 2 days.  EXAM: CHEST  2 VIEW  COMPARISON:  10/18/2013  FINDINGS: Cardiac silhouette is normal in size. Aorta is uncoiled. No mediastinal or hilar masses. No evidence of adenopathy.  Clear lungs.  No pleural effusion or pneumothorax.  The bony thorax is intact.  IMPRESSION: No active cardiopulmonary disease.   Electronically Signed   By: Lajean Manes M.D.   On: 06/15/2014 08:03     EKG: Independently reviewed. Normal sinus rhythm  ASSESSMENT AND PLAN: Present on Admission:  . Chest pain - Suspect this is atypical, as it is easily reproducible on palpation and also on movement of the left arm. However she does have some risk factors, would admit  and cycle cardiac enzymes. Since she had recent workup last year including a cardiac cath and nuclear stress test, if her enzymes are negative, suspect she can be discharged home tomorrow. I will place on aspirin, and start on scheduled Toradol. Check echocardiogram.  . Hypertension - Continue with amlodipine and telmisartan. She does have lower extremity edema, we'll check a Doppler ultrasound of her legs, if negative, suspect we can stop amlodipine, and place her on a diuretic.   . Diabetes - Hold metformin, place on SSI while inpatient.  . Bilateral lower extremity edema - Suspect secondary to amlodipine treatment. However has a recent travel history-traveled to [place] from [place]. Check lower extremity Doppler. We'll also check a 2-D echocardiogram. If all of these tests are negative, may need to stop amlodipine, and start her on a diuretic regimen.  Further plan will depend as patient's clinical course evolves and further radiologic and laboratory data become available. Patient will be monitored closely.  Above noted plan was discussed with patient/,she was in agreement.   DVT Prophylaxis: Prophylactic Lovenox   Code Status: Full Code  Total time spent for admission equals 45 minutes.  [facility] Pager [phone]  If 7PM-7AM, please contact night-coverage [email or web address] Password [email or web address] 06/15/2014, 9:17 AM  **Disclaimer: This note may have been dictated with voice recognition software. Similar sounding words can inadvertently be transcribed and this note may contain transcription errors which may not have been corrected upon publication of note.**

## 2014-06-15 NOTE — ED Provider Notes (Signed)
Medical screening examination/treatment/procedure(s) were performed by non-physician practitioner and as supervising physician I was immediately available for consultation/collaboration.   EKG Interpretation   Date/Time:  Saturday June 15 2014 04:38:37 EDT Ventricular Rate:  78 PR Interval:  150 QRS Duration: 84 QT Interval:  420 QTC Calculation: 478 R Axis:   35 Text Interpretation:  Normal sinus rhythm Normal ECG Confirmed by Kanin Lia   MD, Yamil Dougher ([id number]) on 06/15/2014 5:43:51 AM       Kalman Drape, MD 06/15/14 2125

## 2014-06-15 NOTE — ED Notes (Signed)
C/o L CP, sob and dizziness. (denies: nausea, cough, congestion, fever, vd), pain onset Thursday. "Was taking medication for gas". No aleviating factors, worse when lying down. "Aleve helped a little". Alert, NAD, calm, interactive, speech clear, no dyspnea noted.

## 2014-06-15 NOTE — Progress Notes (Signed)
Utilization Review Completed.Diane [name] T8/15/2015  

## 2014-06-15 NOTE — ED Notes (Addendum)
Pt preoccupied with sleeping, states, "I'm really tired". Positioned for sleep, eyes closed, heavy resps, sonorous resps, arousable to voice.

## 2014-06-15 NOTE — Progress Notes (Signed)
*  PRELIMINARY RESULTS* Vascular Ultrasound Lower extremity venous duplex has been completed.  Preliminary findings: No evidence of DVT  Landry Mellow, RDMS, RVT  06/15/2014, 3:55 PM

## 2014-06-16 DIAGNOSIS — E119 Type 2 diabetes mellitus without complications: Secondary | ICD-10-CM | POA: Diagnosis not present

## 2014-06-16 DIAGNOSIS — R079 Chest pain, unspecified: Secondary | ICD-10-CM | POA: Diagnosis not present

## 2014-06-16 DIAGNOSIS — R072 Precordial pain: Secondary | ICD-10-CM

## 2014-06-16 DIAGNOSIS — I10 Essential (primary) hypertension: Secondary | ICD-10-CM | POA: Diagnosis not present

## 2014-06-16 LAB — GLUCOSE, CAPILLARY
GLUCOSE-CAPILLARY: 86 mg/dL (ref 70–99)
Glucose-Capillary: 132 mg/dL — ABNORMAL HIGH (ref 70–99)
Glucose-Capillary: 162 mg/dL — ABNORMAL HIGH (ref 70–99)

## 2014-06-16 NOTE — Discharge Summary (Signed)
Physician Discharge Summary  Diane [name] AYT:[id number] DOB: 1946/04/22 DOA: 06/15/2014  PCP: Tommy Medal, MD  Admit date: 06/15/2014 Discharge date: 06/16/2014  Time spent: >45 minutes   Discharge Diagnoses:  Principal Problem:   Chest pain Active Problems:   Hypertension   Diabetes   Discharge Condition: stable  Diet recommendation: heart healthy, carb modified  Filed Weights   06/15/14 1011  Weight: 105.235 kg (232 lb)    History of present illness:  Diane [name] is a 68 y.o. female with a Past Medical History of hypertension and diabetes who presents today with the above noted complaint. Per patient, approximately 2 days back, she developed pain in her right shoulder area along with some tenderness in her right and left side of the chest, she took Aleve yesterday and the pain in her right shoulder completely resolved. This morning, she woke up with left-sided chest pain, which she describes as pressure-like. Pain is worse when she takes a deep breath, also worse upon movement of her left arm. Pain is easily reproducible on tenderness.   Hospital Course:  Chest pain - resolved with IV Toradol  - negative Troponins x 3 sets and apparently a normal cath last November therefore, will not do any further work up    Hypertension  - Continue with amlodipine and telmisartan.    lower extremity edema - Doppler ultrasound of her legs negative, ECHO negative for heart failure- cont TEDS  . Diabetes  - resume metformin  Procedures:  none  Consultations:  none  Discharge Exam: Filed Vitals:   06/16/14 1415  BP: 138/72  Pulse: 75  Temp: 98.3 F (36.8 C)  Resp: 12    General: AAo x 3, no distress Cardiovascular: RRR, no murmurs Respiratory: CTA b/l   Discharge Instructions You were cared for by a hospitalist during your hospital stay. If you have any questions about your discharge medications or the care you received while you were in the hospital after you  are discharged, you can call the unit and asked to speak with the hospitalist on call if the hospitalist that took care of you is not available. Once you are discharged, your primary care physician will handle any further medical issues. Please note that NO REFILLS for any discharge medications will be authorized once you are discharged, as it is imperative that you return to your primary care physician (or establish a relationship with a primary care physician if you do not have one) for your aftercare needs so that they can reassess your need for medications and monitor your lab values.      Discharge Instructions   Diet - low sodium heart healthy    Complete by:  As directed   Carb modified     Increase activity slowly    Complete by:  As directed             Medication List         aspirin 81 MG chewable tablet  Chew 81 mg by mouth daily.     metFORMIN 500 MG tablet  Commonly known as:  GLUCOPHAGE  Take 500 mg by mouth daily with breakfast.     omeprazole 20 MG capsule  Commonly known as:  PRILOSEC  Take 20 mg by mouth daily.     TWYNSTA 80-5 MG Tabs  Generic drug:  Telmisartan-Amlodipine  Take 1 tablet by mouth daily.       Allergies  Allergen Reactions  . Cephalexin Other (See Comments)    "  blocked my urine"  . Codeine Rash      The results of significant diagnostics from this hospitalization (including imaging, microbiology, ancillary and laboratory) are listed below for reference.    Significant Diagnostic Studies: Dg Chest 2 View  06/15/2014   CLINICAL DATA:  Left-sided chest pain for 2 days.  EXAM: CHEST  2 VIEW  COMPARISON:  10/18/2013  FINDINGS: Cardiac silhouette is normal in size. Aorta is uncoiled. No mediastinal or hilar masses. No evidence of adenopathy.  Clear lungs.  No pleural effusion or pneumothorax.  The bony thorax is intact.  IMPRESSION: No active cardiopulmonary disease.   Electronically Signed   By: Lajean Manes M.D.   On: 06/15/2014 08:03     Microbiology: No results found for this or any previous visit (from the past 240 hour(s)).   Labs: Basic Metabolic Panel:  Recent Labs Lab 06/15/14 0556 06/15/14 1110  NA 142  --   K 3.9  --   CL 106  --   CO2 25  --   GLUCOSE 148*  --   BUN 15  --   CREATININE 0.60 0.52  CALCIUM 9.3  --    Liver Function Tests: No results found for this basename: AST, ALT, ALKPHOS, BILITOT, PROT, ALBUMIN,  in the last 168 hours No results found for this basename: LIPASE, AMYLASE,  in the last 168 hours No results found for this basename: AMMONIA,  in the last 168 hours CBC:  Recent Labs Lab 06/15/14 0524 06/15/14 1110  WBC 5.9 5.0  NEUTROABS 3.3  --   HGB 12.8 12.1  HCT 38.8 37.4  MCV 91.3 91.7  PLT 232 217   Cardiac Enzymes:  Recent Labs Lab 06/15/14 1110 06/15/14 1433 06/15/14 1810  TROPONINI <0.30 <0.30 <0.30   BNP: BNP (last 3 results) No results found for this basename: PROBNP,  in the last 8760 hours CBG:  Recent Labs Lab 06/15/14 1141 06/15/14 1659 06/15/14 2041 06/16/14 0741 06/16/14 1139  GLUCAP 192* 135* 151* 132* 162*       SignedDebbe Odea, MD  [facility] 06/16/2014, 4:01 PM

## 2014-06-16 NOTE — Progress Notes (Signed)
  Echocardiogram 2D Echocardiogram has been performed.  Mauricio Po 06/16/2014, 9:59 AM

## 2014-07-24 ENCOUNTER — Other Ambulatory Visit (HOSPITAL_COMMUNITY): Payer: Self-pay | Admitting: Internal Medicine

## 2014-07-24 DIAGNOSIS — Z1231 Encounter for screening mammogram for malignant neoplasm of breast: Secondary | ICD-10-CM

## 2014-07-24 DIAGNOSIS — I10 Essential (primary) hypertension: Secondary | ICD-10-CM | POA: Diagnosis not present

## 2014-07-24 DIAGNOSIS — IMO0001 Reserved for inherently not codable concepts without codable children: Secondary | ICD-10-CM | POA: Diagnosis not present

## 2014-07-24 DIAGNOSIS — R109 Unspecified abdominal pain: Secondary | ICD-10-CM | POA: Diagnosis not present

## 2014-07-25 ENCOUNTER — Ambulatory Visit (HOSPITAL_COMMUNITY)
Admission: RE | Admit: 2014-07-25 | Discharge: 2014-07-25 | Disposition: A | Discharge status: Home / Self Care | Payer: Medicare Other | Source: Ambulatory Visit | Attending: Internal Medicine | Admitting: Internal Medicine

## 2014-07-25 ENCOUNTER — Other Ambulatory Visit: Payer: Self-pay | Admitting: Internal Medicine

## 2014-07-25 DIAGNOSIS — Z1231 Encounter for screening mammogram for malignant neoplasm of breast: Secondary | ICD-10-CM | POA: Insufficient documentation

## 2014-07-25 DIAGNOSIS — R1031 Right lower quadrant pain: Secondary | ICD-10-CM

## 2014-08-01 ENCOUNTER — Ambulatory Visit
Admission: RE | Admit: 2014-08-01 | Discharge: 2014-08-01 | Disposition: A | Discharge status: Home / Self Care | Payer: Medicare Other | Source: Ambulatory Visit | Attending: Internal Medicine | Admitting: Internal Medicine

## 2014-08-01 ENCOUNTER — Other Ambulatory Visit: Payer: Self-pay | Admitting: Internal Medicine

## 2014-08-01 DIAGNOSIS — R1031 Right lower quadrant pain: Secondary | ICD-10-CM | POA: Diagnosis not present

## 2014-08-01 DIAGNOSIS — Z9049 Acquired absence of other specified parts of digestive tract: Secondary | ICD-10-CM | POA: Diagnosis not present

## 2014-08-01 DIAGNOSIS — K429 Umbilical hernia without obstruction or gangrene: Secondary | ICD-10-CM | POA: Diagnosis not present

## 2014-08-01 DIAGNOSIS — Z90710 Acquired absence of both cervix and uterus: Secondary | ICD-10-CM | POA: Diagnosis not present

## 2014-08-01 IMAGING — CT CT ABD-PELV W/O CM
3 of 4 series · 13 of 36 positions shown, 19 images · IV contrast (READICAT/WATER)
Comparison: 09/29/2011

CLINICAL DATA: RIGHT flank and RIGHT lower quadrant pain for
several months, past history of cholecystectomy, hysterectomy,
hypertension, diabetes

EXAM:
CT ABDOMEN AND PELVIS WITHOUT CONTRAST
TECHNIQUE: Multidetector CT imaging of the abdomen and pelvis was performed
following the standard protocol without IV contrast. Sagittal and
coronal MPR images reconstructed from axial data set. IV access not
administered due to inability to establish adequate IV access.
Patient drank dilute oral contrast for exam.

[Series 3: abd/pelvis w/o · axial · non-contrast · 0.86mm/px · z∈[-381,-6]mm · 8 of 97 slices shown, 13 images]
[im 11/97  soft-tissue]
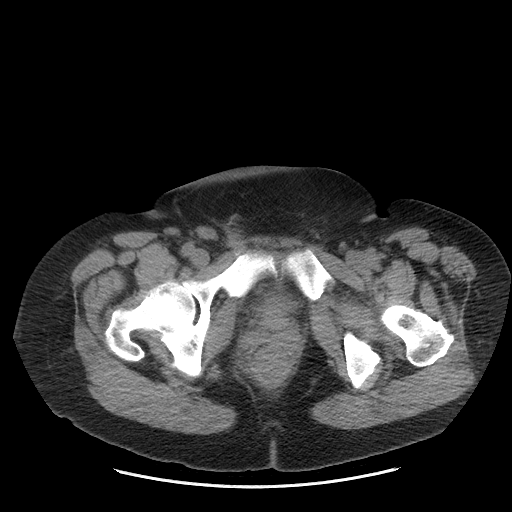
[im 11/97  bone]
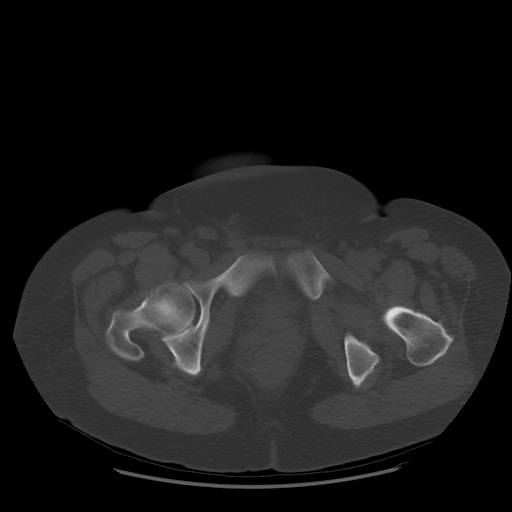
[im 22/97  soft-tissue]
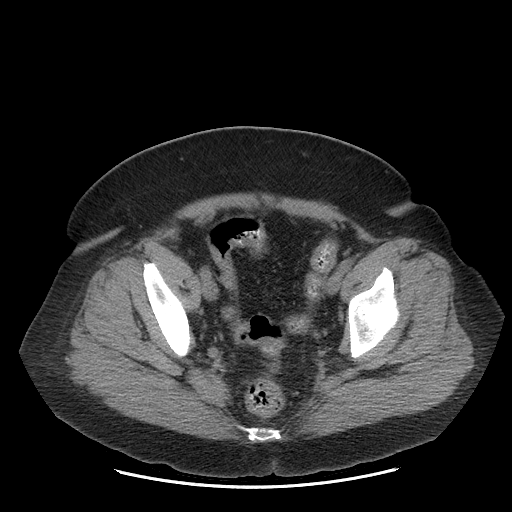
[im 33/97  soft-tissue]
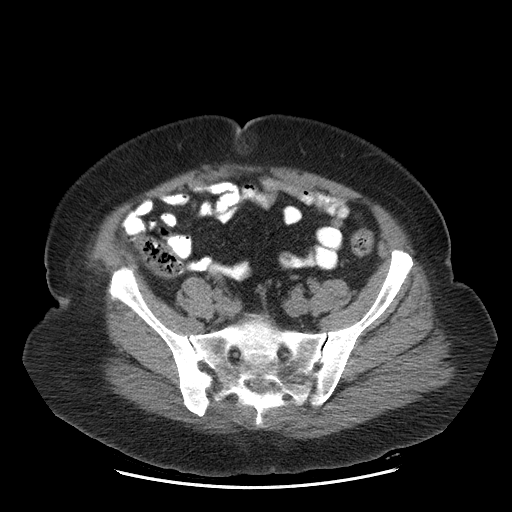
[im 43/97  soft-tissue]
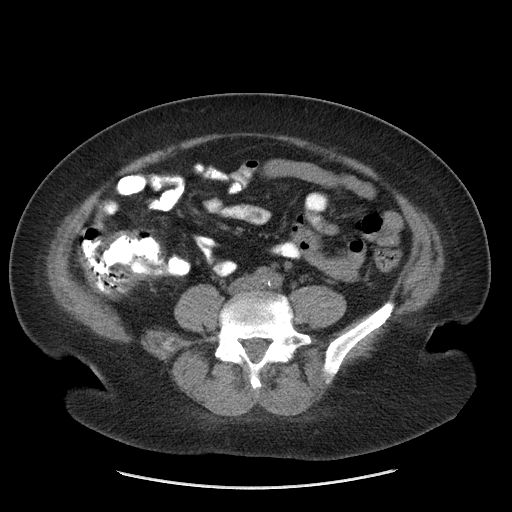
[im 54/97  soft-tissue]
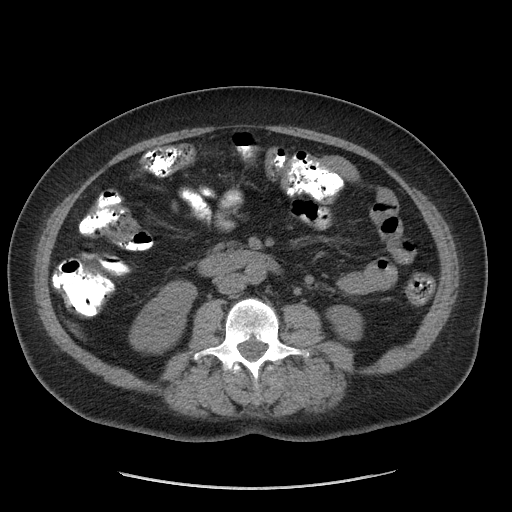
[im 54/97  lung]
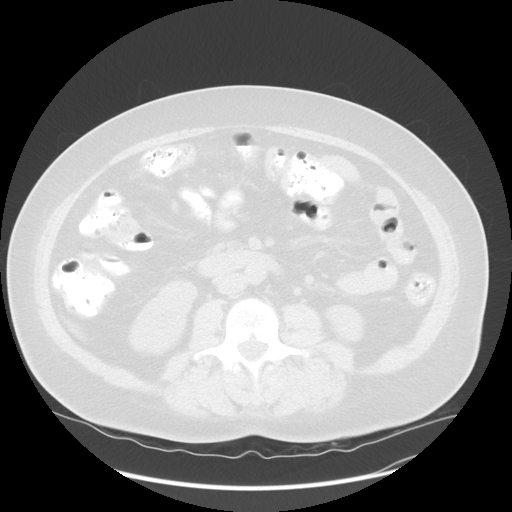
[im 65/97  soft-tissue]
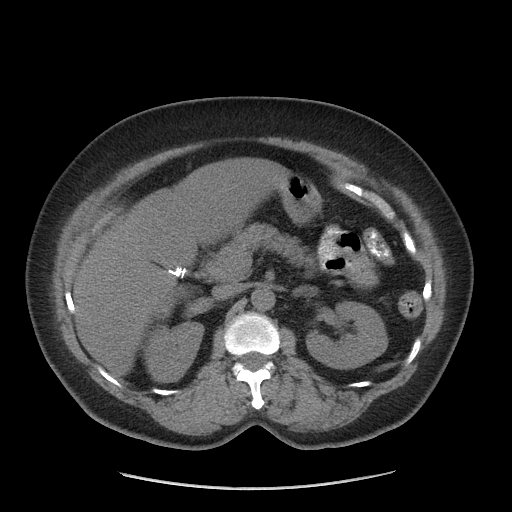
[im 65/97  lung]
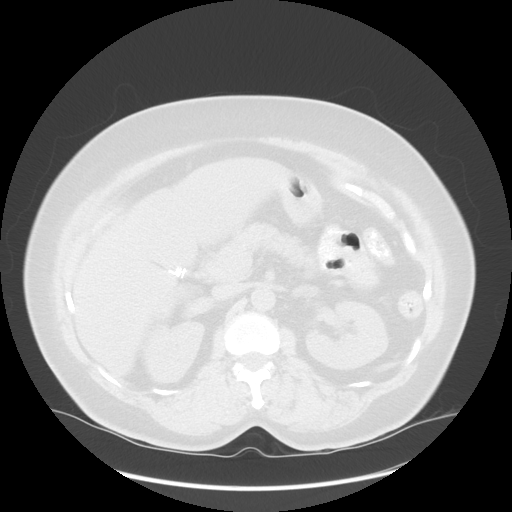
[im 75/97  soft-tissue]
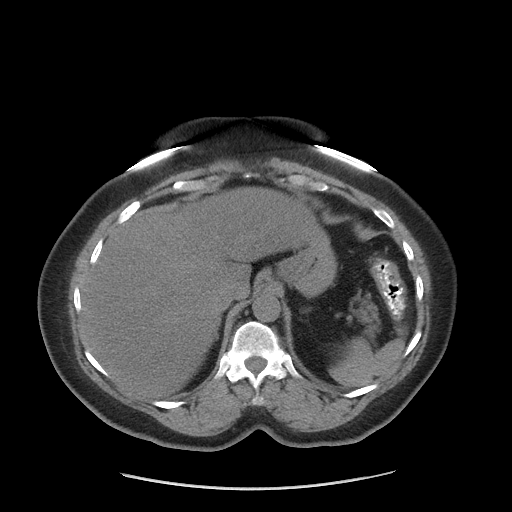
[im 75/97  lung]
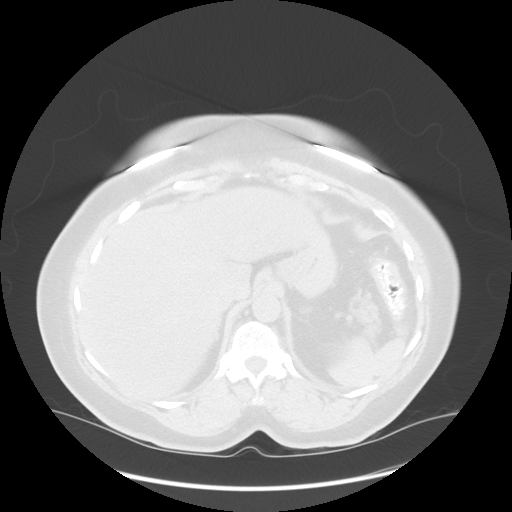
[im 86/97  soft-tissue]
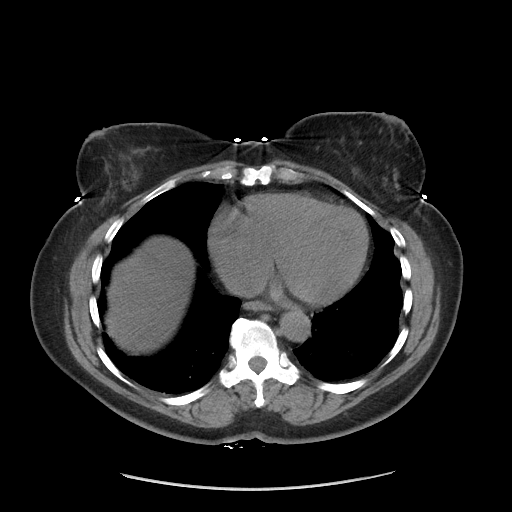
[im 86/97  lung]
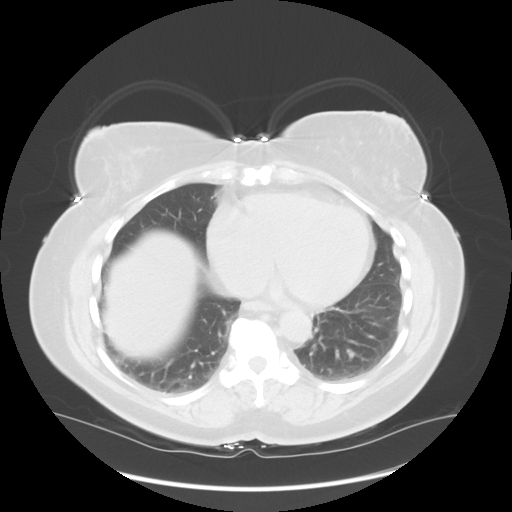

[Series 601: coronal body · coronal · 0.95mm/px · 1 of 141 slices shown, 2 images]
[im 47/141  soft-tissue]
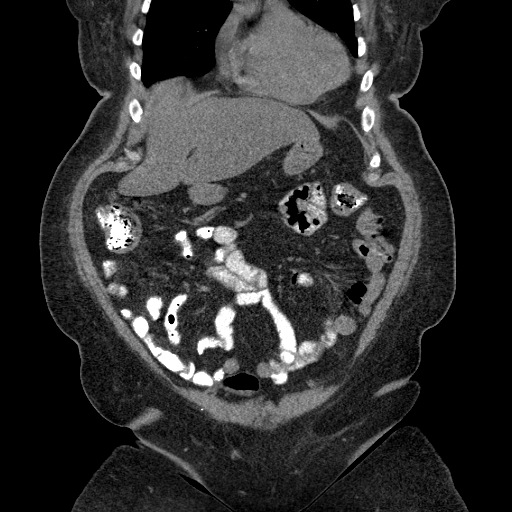
[im 47/141  bone]
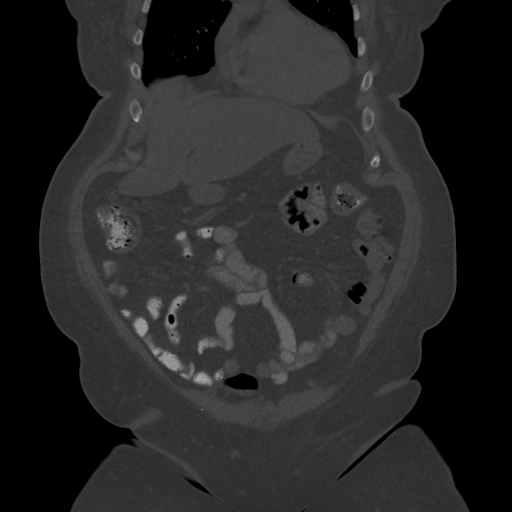

[Series 602: sagittal body · sagittal · 0.95mm/px · 4 of 175 slices shown]
[im 20/175  soft-tissue]
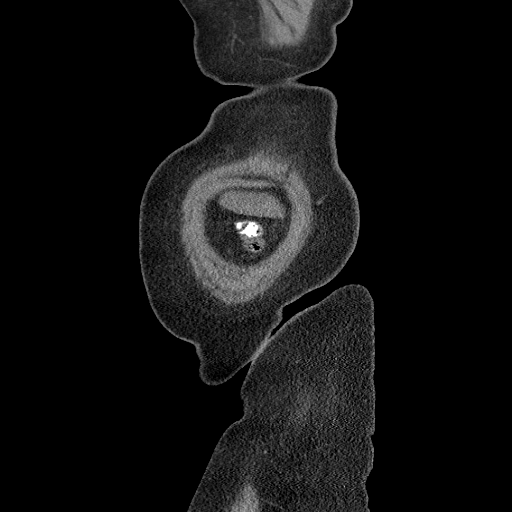
[im 39/175  soft-tissue]
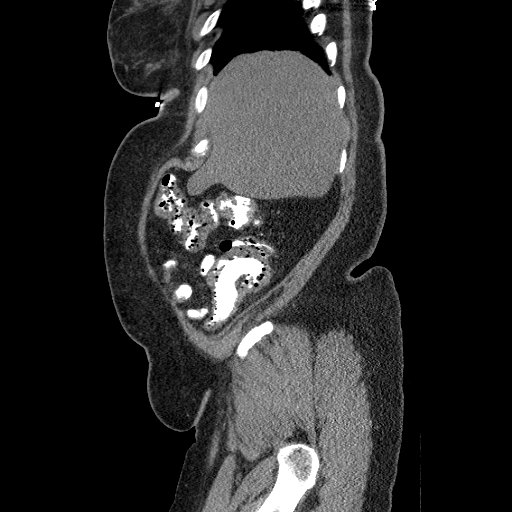
[im 59/175  soft-tissue]
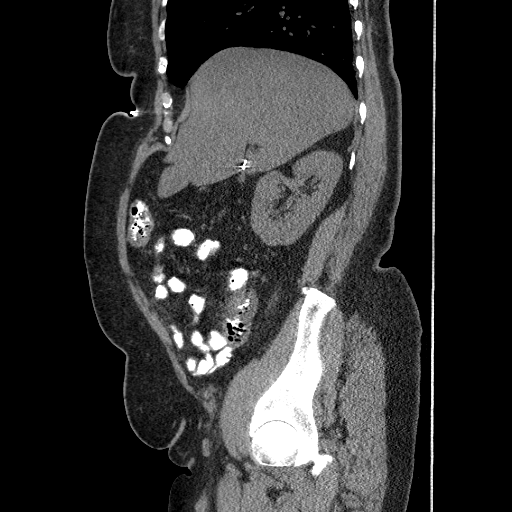
[im 78/175  soft-tissue]
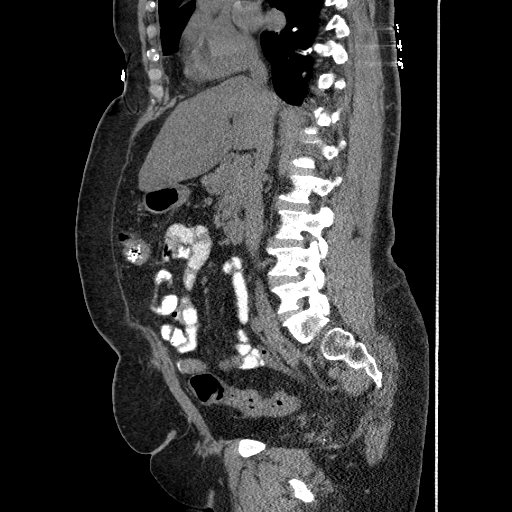

[13 of 36 positions shown; findings below may reference images not displayed]

FINDINGS: Dependent bibasilar atelectasis.

Gallbladder and uterus surgically absent with nonvisualization of
the ovaries.

Normal appendix.

Within limits of a nonenhanced exam, no focal abnormalities of the
liver, spleen, pancreas, kidneys, or adrenal glands.

Stomach and bowel loops unremarkable.

Tiny umbilical hernia containing fat.

Normal appearing bladder in ureters.

No mass, adenopathy, free fluid, or inflammatory process.

Osseous structures unremarkable.
IMPRESSION: Tiny umbilical hernia containing fat.

No acute intra-abdominal or intrapelvic abnormalities.

## 2014-08-21 DIAGNOSIS — I10 Essential (primary) hypertension: Secondary | ICD-10-CM | POA: Diagnosis not present

## 2014-08-21 DIAGNOSIS — M793 Panniculitis, unspecified: Secondary | ICD-10-CM | POA: Diagnosis not present

## 2014-08-21 DIAGNOSIS — E1165 Type 2 diabetes mellitus with hyperglycemia: Secondary | ICD-10-CM | POA: Diagnosis not present

## 2014-08-21 DIAGNOSIS — R21 Rash and other nonspecific skin eruption: Secondary | ICD-10-CM | POA: Diagnosis not present

## 2014-09-05 DIAGNOSIS — I8393 Asymptomatic varicose veins of bilateral lower extremities: Secondary | ICD-10-CM | POA: Diagnosis not present

## 2014-11-08 ENCOUNTER — Ambulatory Visit (INDEPENDENT_AMBULATORY_CARE_PROVIDER_SITE_OTHER): Payer: Medicare Other | Admitting: Family Medicine

## 2014-11-08 VITALS — BP 128/70 | HR 89 | Temp 98.9°F | Resp 16 | Ht 68.5 in | Wt 234.8 lb

## 2014-11-08 DIAGNOSIS — L03119 Cellulitis of unspecified part of limb: Secondary | ICD-10-CM

## 2014-11-08 MED ORDER — DOXYCYCLINE HYCLATE 100 MG PO TABS: 100.0000 mg | ORAL_TABLET | Freq: Two times a day (BID) | ORAL | Status: DC

## 2014-11-08 MED ORDER — TRAMADOL HCL 50 MG PO TABS: 50.0000 mg | ORAL_TABLET | Freq: Three times a day (TID) | ORAL | Status: DC | PRN

## 2014-11-08 NOTE — Progress Notes (Signed)
This is a 69 year old married woman who felt sudden sharp pains in her left popliteal area yesterday followed by papules. She noted a "snapper" insect in her sheets which she thoroughly washed.  Patient noted sharp pains in her right hip area with red elevated and tender plaques.  Objective: Patient has 2 papules which are tender in the popliteal area.  Patient also has 2x1 cm irregular elevated wheals which are erythematous in the right lateral trochanteric area.  This chart was scribed in my presence and reviewed by me personally.    ICD-9-CM ICD-10-CM   1. Cellulitis of lower extremity, unspecified laterality 682.6 L03.119 doxycycline (VIBRA-TABS) 100 MG tablet     traMADol (ULTRAM) 50 MG tablet   These appear to be MRSA type infections. The Tegaderm both sides of her body tends to mitigate against them being zoster outbreak.  Signed, Robyn Haber, MD

## 2014-11-08 NOTE — Patient Instructions (Signed)

## 2014-11-11 DIAGNOSIS — Z0000 Encounter for general adult medical examination without abnormal findings: Secondary | ICD-10-CM | POA: Diagnosis not present

## 2014-11-11 DIAGNOSIS — L0390 Cellulitis, unspecified: Secondary | ICD-10-CM | POA: Diagnosis not present

## 2014-11-11 DIAGNOSIS — Z1389 Encounter for screening for other disorder: Secondary | ICD-10-CM | POA: Diagnosis not present

## 2014-11-28 DIAGNOSIS — H43812 Vitreous degeneration, left eye: Secondary | ICD-10-CM | POA: Diagnosis not present

## 2014-11-28 DIAGNOSIS — H25811 Combined forms of age-related cataract, right eye: Secondary | ICD-10-CM | POA: Diagnosis not present

## 2014-11-28 DIAGNOSIS — H35372 Puckering of macula, left eye: Secondary | ICD-10-CM | POA: Diagnosis not present

## 2014-11-28 DIAGNOSIS — Z961 Presence of intraocular lens: Secondary | ICD-10-CM | POA: Diagnosis not present

## 2014-11-28 DIAGNOSIS — H10413 Chronic giant papillary conjunctivitis, bilateral: Secondary | ICD-10-CM | POA: Diagnosis not present

## 2015-01-06 DIAGNOSIS — E876 Hypokalemia: Secondary | ICD-10-CM | POA: Diagnosis not present

## 2015-01-06 DIAGNOSIS — R109 Unspecified abdominal pain: Secondary | ICD-10-CM | POA: Diagnosis not present

## 2015-01-06 DIAGNOSIS — E1165 Type 2 diabetes mellitus with hyperglycemia: Secondary | ICD-10-CM | POA: Diagnosis not present

## 2015-02-13 DIAGNOSIS — R1011 Right upper quadrant pain: Secondary | ICD-10-CM | POA: Diagnosis not present

## 2015-02-14 ENCOUNTER — Other Ambulatory Visit (HOSPITAL_COMMUNITY): Payer: Self-pay | Admitting: Internal Medicine

## 2015-02-14 DIAGNOSIS — R1011 Right upper quadrant pain: Secondary | ICD-10-CM

## 2015-02-17 ENCOUNTER — Ambulatory Visit (HOSPITAL_COMMUNITY)
Admission: RE | Admit: 2015-02-17 | Discharge: 2015-02-17 | Disposition: A | Discharge status: Home / Self Care | Payer: Medicare Other | Source: Ambulatory Visit | Attending: Internal Medicine | Admitting: Internal Medicine

## 2015-02-17 DIAGNOSIS — R1011 Right upper quadrant pain: Secondary | ICD-10-CM | POA: Insufficient documentation

## 2015-02-17 DIAGNOSIS — Z9049 Acquired absence of other specified parts of digestive tract: Secondary | ICD-10-CM | POA: Diagnosis not present

## 2015-02-17 IMAGING — US US ABDOMEN COMPLETE
1 series · 14 of 25 positions shown · non-contrast
Comparison: 08/01/2014 CT abdomen

CLINICAL DATA: Right upper quadrant pain.  Tenderness touch.

EXAM:
ULTRASOUND ABDOMEN COMPLETE

[Series 1: us abdomen complete · 0.20mm/px · 14 of 42 slices shown]
[im 1/42]
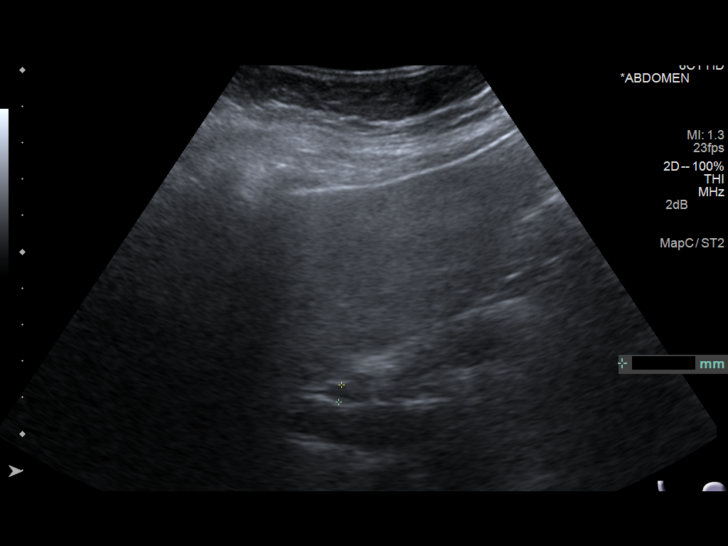
[im 4/42]
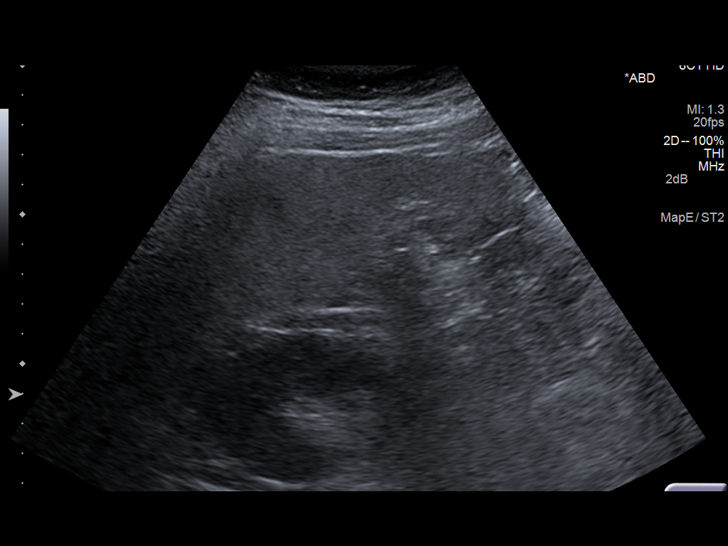
[im 7/42]
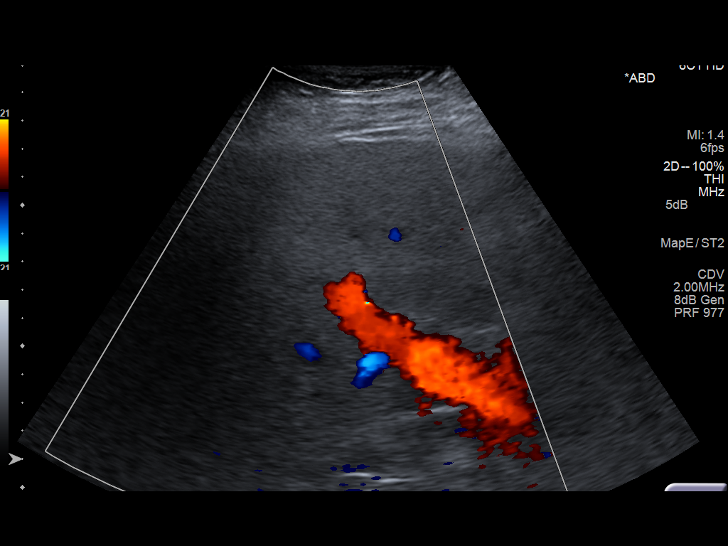
[im 11/42]
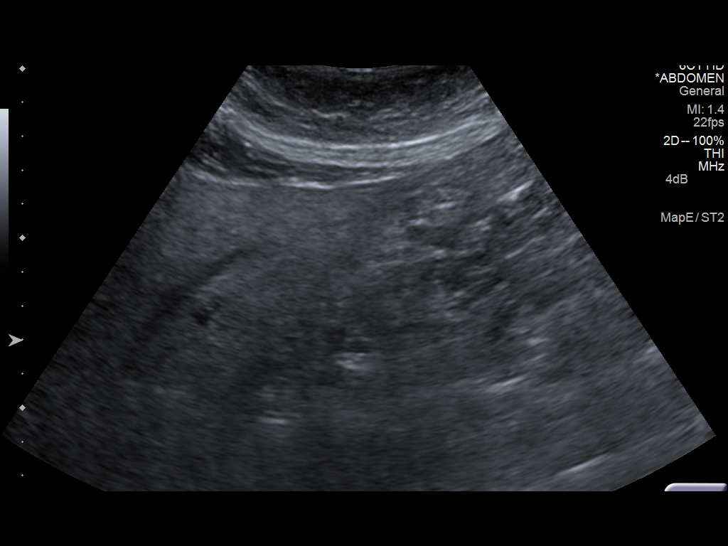
[im 14/42]
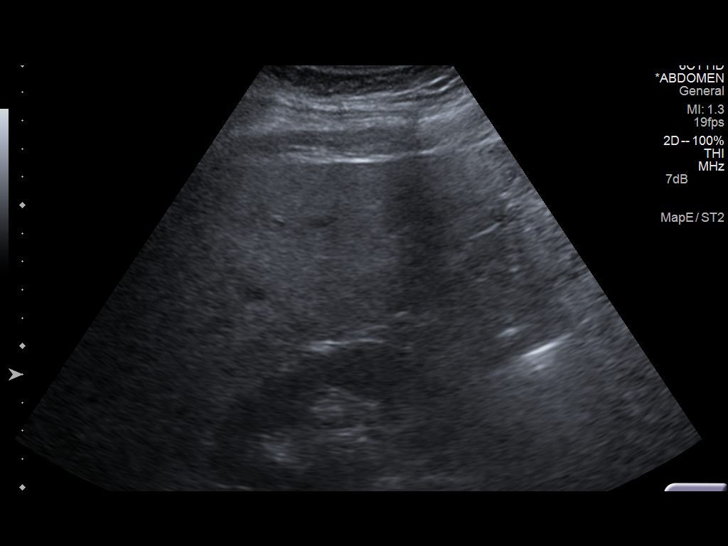
[im 16/42]
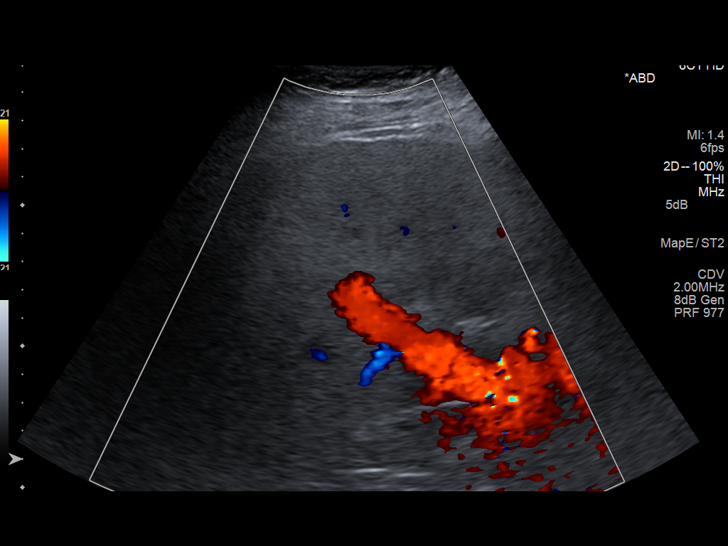
[im 19/42]
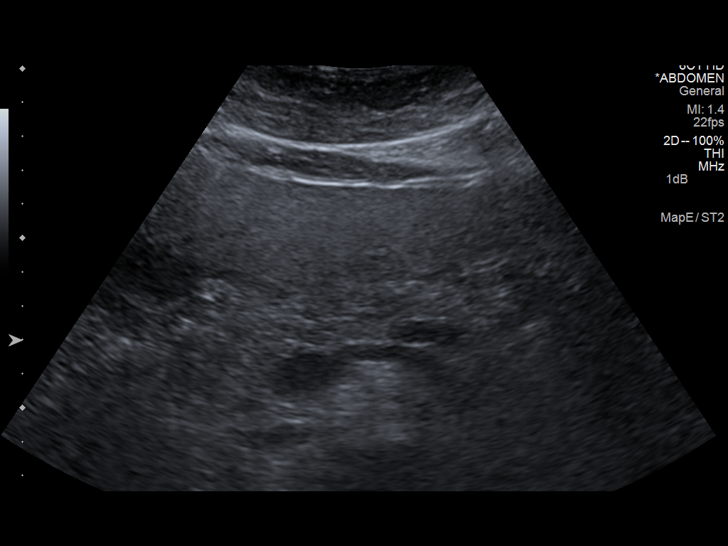
[im 23/42]
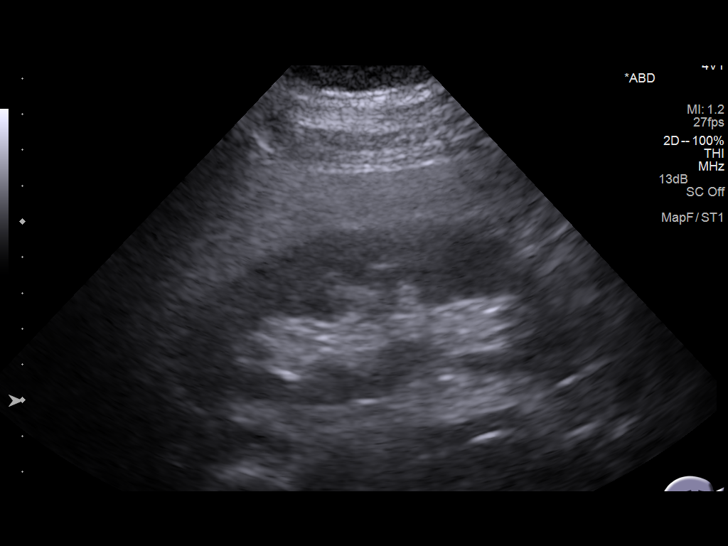
[im 26/42]
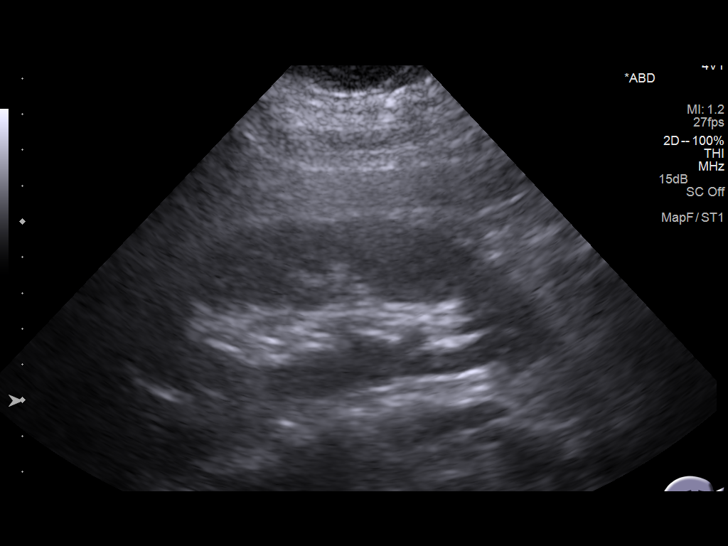
[im 28/42]
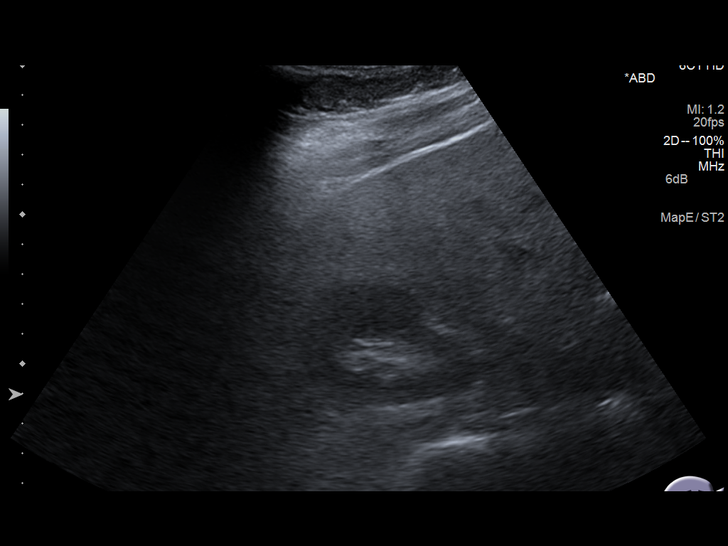
[im 31/42]
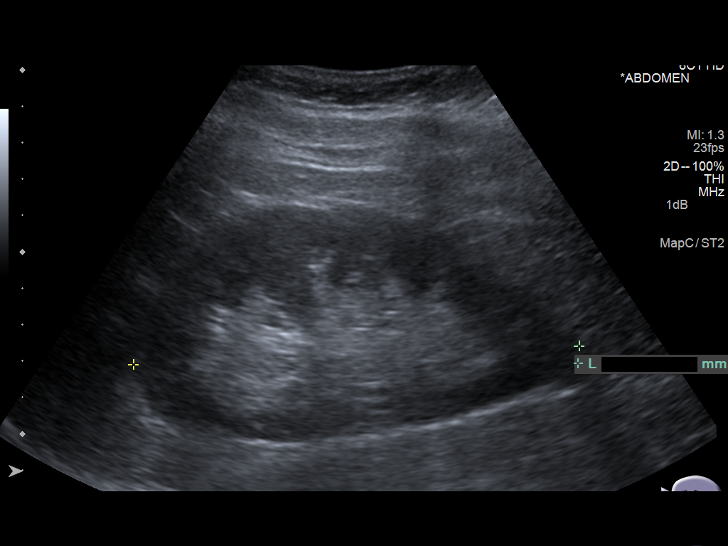
[im 35/42]
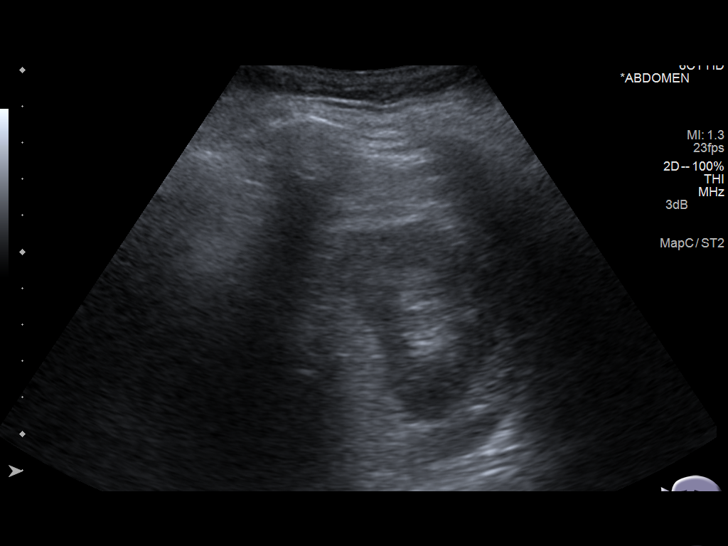
[im 38/42]
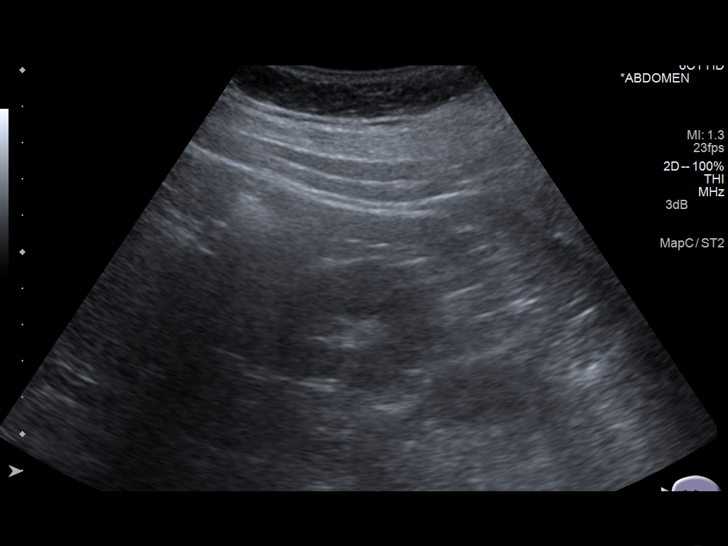
[im 42/42]
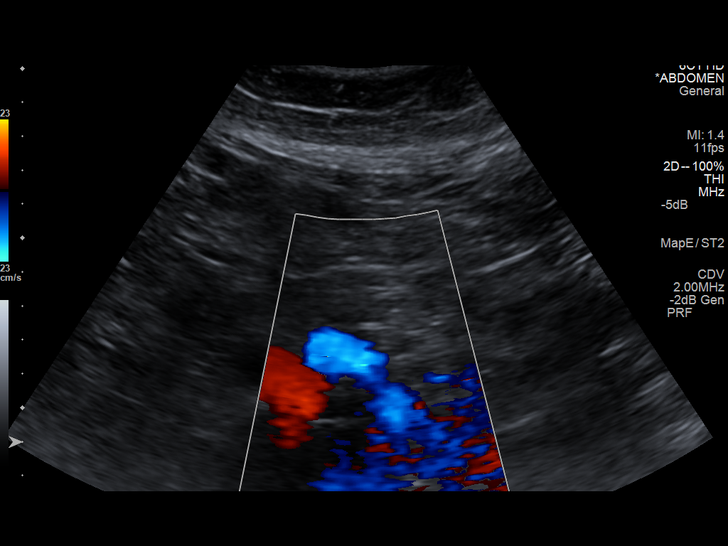

[14 of 25 positions shown; findings below may reference images not displayed]

FINDINGS: Gallbladder: Prior cholecystectomy.

Common bile duct: Diameter: 4.8 mm

Liver: No focal lesion identified. Increased hepatic parenchymal
echogenicity.

IVC: No abnormality visualized.

Pancreas: Visualized portion unremarkable. Portions of the pancreas
are obscured by overlying bowel gas.

Spleen: Size and appearance within normal limits.

Right Kidney: Length: 11.7 cm. Echogenicity within normal limits. No
mass or hydronephrosis visualized.

Left Kidney: Length: 12.2 cm. Echogenicity within normal limits. No
mass or hydronephrosis visualized.

Abdominal aorta: No aneurysm visualized.

Other findings: None.
IMPRESSION: 1. Prior cholecystectomy.
2. Increased hepatic parenchymal echogenicity as can be seen with
hepatic steatosis.

## 2015-03-12 DIAGNOSIS — L308 Other specified dermatitis: Secondary | ICD-10-CM | POA: Diagnosis not present

## 2015-03-12 DIAGNOSIS — L259 Unspecified contact dermatitis, unspecified cause: Secondary | ICD-10-CM | POA: Diagnosis not present

## 2015-04-28 ENCOUNTER — Other Ambulatory Visit: Payer: Self-pay

## 2015-05-02 DIAGNOSIS — E119 Type 2 diabetes mellitus without complications: Secondary | ICD-10-CM | POA: Diagnosis not present

## 2015-05-02 DIAGNOSIS — I10 Essential (primary) hypertension: Secondary | ICD-10-CM | POA: Diagnosis not present

## 2015-05-02 DIAGNOSIS — E0840 Diabetes mellitus due to underlying condition with diabetic neuropathy, unspecified: Secondary | ICD-10-CM | POA: Diagnosis not present

## 2015-05-27 DIAGNOSIS — E1140 Type 2 diabetes mellitus with diabetic neuropathy, unspecified: Secondary | ICD-10-CM | POA: Diagnosis not present

## 2015-05-28 DIAGNOSIS — L600 Ingrowing nail: Secondary | ICD-10-CM | POA: Diagnosis not present

## 2015-06-11 DIAGNOSIS — L03032 Cellulitis of left toe: Secondary | ICD-10-CM | POA: Diagnosis not present

## 2015-06-24 ENCOUNTER — Emergency Department (INDEPENDENT_AMBULATORY_CARE_PROVIDER_SITE_OTHER)
Admission: EM | Admit: 2015-06-24 | Discharge: 2015-06-24 | Disposition: A | Discharge status: Home / Self Care | Payer: Medicare Other | Source: Home / Self Care | Attending: Emergency Medicine | Admitting: Emergency Medicine

## 2015-06-24 ENCOUNTER — Encounter (HOSPITAL_COMMUNITY): Payer: Self-pay | Admitting: Emergency Medicine

## 2015-06-24 DIAGNOSIS — K2100 Gastro-esophageal reflux disease with esophagitis, without bleeding: Secondary | ICD-10-CM

## 2015-06-24 DIAGNOSIS — R141 Gas pain: Secondary | ICD-10-CM

## 2015-06-24 DIAGNOSIS — K2970 Gastritis, unspecified, without bleeding: Secondary | ICD-10-CM | POA: Diagnosis not present

## 2015-06-24 MED ORDER — SUCRALFATE 1 G PO TABS: ORAL_TABLET | ORAL | Status: DC

## 2015-06-24 NOTE — Discharge Instructions (Signed)
Gastroesophageal Reflux Disease, Adult Take the Carafate before meals and bedtime to coat your stomach Take Zantac 150 mg twice a day along with your omeprazole  Gastroesophageal reflux disease (GERD) happens when acid from your stomach flows up into the esophagus. When acid comes in contact with the esophagus, the acid causes soreness (inflammation) in the esophagus. Over time, GERD may create small holes (ulcers) in the lining of the esophagus. CAUSES   Increased body weight. This puts pressure on the stomach, making acid rise from the stomach into the esophagus.  Smoking. This increases acid production in the stomach.  Drinking alcohol. This causes decreased pressure in the lower esophageal sphincter (valve or ring of muscle between the esophagus and stomach), allowing acid from the stomach into the esophagus.  Late evening meals and a full stomach. This increases pressure and acid production in the stomach.  A malformed lower esophageal sphincter. Sometimes, no cause is found. SYMPTOMS   Burning pain in the lower part of the mid-chest behind the breastbone and in the mid-stomach area. This may occur twice a week or more often.  Trouble swallowing.  Sore throat.  Dry cough.  Asthma-like symptoms including chest tightness, shortness of breath, or wheezing. DIAGNOSIS  Your caregiver may be able to diagnose GERD based on your symptoms. In some cases, X-rays and other tests may be done to check for complications or to check the condition of your stomach and esophagus. TREATMENT  Your caregiver may recommend over-the-counter or prescription medicines to help decrease acid production. Ask your caregiver before starting or adding any new medicines.  HOME CARE INSTRUCTIONS   Change the factors that you can control. Ask your caregiver for guidance concerning weight loss, quitting smoking, and alcohol consumption.  Avoid foods and drinks that make your symptoms worse, such  as:  Caffeine or alcoholic drinks.  Chocolate.  Peppermint or mint flavorings.  Garlic and onions.  Spicy foods.  Citrus fruits, such as oranges, lemons, or limes.  Tomato-based foods such as sauce, chili, salsa, and pizza.  Fried and fatty foods.  Avoid lying down for the 3 hours prior to your bedtime or prior to taking a nap.  Eat small, frequent meals instead of large meals.  Wear loose-fitting clothing. Do not wear anything tight around your waist that causes pressure on your stomach.  Raise the head of your bed 6 to 8 inches with wood blocks to help you sleep. Extra pillows will not help.  Only take over-the-counter or prescription medicines for pain, discomfort, or fever as directed by your caregiver.  Do not take aspirin, ibuprofen, or other nonsteroidal anti-inflammatory drugs (NSAIDs). SEEK IMMEDIATE MEDICAL CARE IF:   You have pain in your arms, neck, jaw, teeth, or back.  Your pain increases or changes in intensity or duration.  You develop nausea, vomiting, or sweating (diaphoresis).  You develop shortness of breath, or you faint.  Your vomit is green, yellow, black, or looks like coffee grounds or blood.  Your stool is red, bloody, or black. These symptoms could be signs of other problems, such as heart disease, gastric bleeding, or esophageal bleeding. MAKE SURE YOU:   Understand these instructions.  Will watch your condition.  Will get help right away if you are not doing well or get worse. Document Released: 07/28/2005 Document Revised: 01/10/2012 Document Reviewed: 05/07/2011 [facility] Patient Information 2015 [place], [place]. This information is not intended to replace advice given to you by your health care provider. Make sure you discuss any questions  you have with your health care provider.  Abdominal Pain Many things can cause abdominal pain. Usually, abdominal pain is not caused by a disease and will improve without treatment. It can often  be observed and treated at home. Your health care provider will do a physical exam and possibly order blood tests and X-rays to help determine the seriousness of your pain. However, in many cases, more time must pass before a clear cause of the pain can be found. Before that point, your health care provider may not know if you need more testing or further treatment. HOME CARE INSTRUCTIONS  Monitor your abdominal pain for any changes. The following actions may help to alleviate any discomfort you are experiencing:  Only take over-the-counter or prescription medicines as directed by your health care provider.  Do not take laxatives unless directed to do so by your health care provider.  Try a clear liquid diet (broth, tea, or water) as directed by your health care provider. Slowly move to a bland diet as tolerated. SEEK MEDICAL CARE IF:  You have unexplained abdominal pain.  You have abdominal pain associated with nausea or diarrhea.  You have pain when you urinate or have a bowel movement.  You experience abdominal pain that wakes you in the night.  You have abdominal pain that is worsened or improved by eating food.  You have abdominal pain that is worsened with eating fatty foods.  You have a fever. SEEK IMMEDIATE MEDICAL CARE IF:   Your pain does not go away within 2 hours.  You keep throwing up (vomiting).  Your pain is felt only in portions of the abdomen, such as the right side or the left lower portion of the abdomen.  You pass bloody or black tarry stools. MAKE SURE YOU:  Understand these instructions.   Will watch your condition.   Will get help right away if you are not doing well or get worse.  Document Released: 07/28/2005 Document Revised: 10/23/2013 Document Reviewed: 06/27/2013 [facility] Patient Information 2015 [place], [place]. This information is not intended to replace advice given to you by your health care provider. Make sure you discuss any questions you  have with your health care provider.  Bloating Bloating is the feeling of fullness in your belly. You may feel as though your pants are too tight. Often the cause of bloating is overeating, retaining fluids, or having gas in your bowel. It is also caused by swallowing air and eating foods that cause gas. Irritable bowel syndrome is one of the most common causes of bloating. Constipation is also a common cause. Sometimes more serious problems can cause bloating. SYMPTOMS  Usually there is a feeling of fullness, as though your abdomen is bulged out. There may be mild discomfort.  DIAGNOSIS  Usually no particular testing is necessary for most bloating. If the condition persists and seems to become worse, your caregiver may do additional testing.  TREATMENT   There is no direct treatment for bloating.  Do not put gas into the bowel. Avoid chewing gum and sucking on candy. These tend to make you swallow air. Swallowing air can also be a nervous habit. Try to avoid this.  Avoiding high residue diets will help. Eat foods with soluble fibers (examples include root vegetables, apples, or barley) and substitute dairy products with soy and rice products. This helps irritable bowel syndrome.  If constipation is the cause, then a high residue diet with more fiber will help.  Avoid carbonated beverages.  Over-the-counter preparations are available that help reduce gas. Your pharmacist can help you with this. SEEK MEDICAL CARE IF:   Bloating continues and seems to be getting worse.  You notice a weight gain.  You have a weight loss but the bloating is getting worse.  You have changes in your bowel habits or develop nausea or vomiting. SEEK IMMEDIATE MEDICAL CARE IF:   You develop shortness of breath or swelling in your legs.  You have an increase in abdominal pain or develop chest pain. Document Released: 08/18/2006 Document Revised: 01/10/2012 Document Reviewed: 10/06/2007 [facility] Patient  Information 2015 [place], [place]. This information is not intended to replace advice given to you by your health care provider. Make sure you discuss any questions you have with your health care provider.  Esophagitis Esophagitis is inflammation of the esophagus. It can involve swelling, soreness, and pain in the esophagus. This condition can make it difficult and painful to swallow. CAUSES  Most causes of esophagitis are not serious. Many different factors can cause esophagitis, including:  Gastroesophageal reflux disease (GERD). This is when acid from your stomach flows up into the esophagus.  Recurrent vomiting.  An allergic-type reaction.  Certain medicines, especially those that come in large pills.  Ingestion of harmful chemicals, such as household cleaning products.  Heavy alcohol use.  An infection of the esophagus.  Radiation treatment for cancer.  Certain diseases such as sarcoidosis, Crohn's disease, and scleroderma. These diseases may cause recurrent esophagitis. SYMPTOMS   Trouble swallowing.  Painful swallowing.  Chest pain.  Difficulty breathing.  Nausea.  Vomiting.  Abdominal pain. DIAGNOSIS  Your caregiver will take your history and do a physical exam. Depending upon what your caregiver finds, certain tests may also be done, including:  Barium X-ray. You will drink a solution that coats the esophagus, and X-rays will be taken.  Endoscopy. A lighted tube is put down the esophagus so your caregiver can examine the area.  Allergy tests. These can sometimes be arranged through follow-up visits. TREATMENT  Treatment will depend on the cause of your esophagitis. In some cases, steroids or other medicines may be given to help relieve your symptoms or to treat the underlying cause of your condition. Medicines that may be recommended include:  Viscous lidocaine, to soothe the esophagus.  Antacids.  Acid reducers.  Proton pump inhibitors.  Antiviral  medicines for certain viral infections of the esophagus.  Antifungal medicines for certain fungal infections of the esophagus.  Antibiotic medicines, depending on the cause of the esophagitis. HOME CARE INSTRUCTIONS   Avoid foods and drinks that seem to make your symptoms worse.  Eat small, frequent meals instead of large meals.  Avoid eating for the 3 hours prior to your bedtime.  If you have trouble taking pills, use a pill splitter to decrease the size and likelihood of the pill getting stuck or injuring the esophagus on the way down. Drinking water after taking a pill also helps.  Stop smoking if you smoke.  Maintain a healthy weight.  Wear loose-fitting clothing. Do not wear anything tight around your waist that causes pressure on your stomach.  Raise the head of your bed 6 to 8 inches with wood blocks to help you sleep. Extra pillows will not help.  Only take over-the-counter or prescription medicines as directed by your caregiver. SEEK IMMEDIATE MEDICAL CARE IF:  You have severe chest pain that radiates into your arm, neck, or jaw.  You feel sweaty, dizzy, or lightheaded.  You have  shortness of breath.  You vomit blood.  You have difficulty or pain with swallowing.  You have bloody or black, tarry stools.  You have a fever.  You have a burning sensation in the chest more than 3 times a week for more than 2 weeks.  You cannot swallow, drink, or eat.  You drool because you cannot swallow your saliva. MAKE SURE YOU:  Understand these instructions.  Will watch your condition.  Will get help right away if you are not doing well or get worse. Document Released: 11/25/2004 Document Revised: 01/10/2012 Document Reviewed: 06/18/2011 [facility] Patient Information 2015 [place], [place]. This information is not intended to replace advice given to you by your health care provider. Make sure you discuss any questions you have with your health care provider.  Food Choices  for Gastroesophageal Reflux Disease When you have gastroesophageal reflux disease (GERD), the foods you eat and your eating habits are very important. Choosing the right foods can help ease the discomfort of GERD. WHAT GENERAL GUIDELINES DO I NEED TO FOLLOW?  Choose fruits, vegetables, whole grains, low-fat dairy products, and low-fat meat, fish, and poultry.  Limit fats such as oils, salad dressings, butter, nuts, and avocado.  Keep a food diary to identify foods that cause symptoms.  Avoid foods that cause reflux. These may be different for different people.  Eat frequent small meals instead of three large meals each day.  Eat your meals slowly, in a relaxed setting.  Limit fried foods.  Cook foods using methods other than frying.  Avoid drinking alcohol.  Avoid drinking large amounts of liquids with your meals.  Avoid bending over or lying down until 2-3 hours after eating. WHAT FOODS ARE NOT RECOMMENDED? The following are some foods and drinks that may worsen your symptoms: Vegetables Tomatoes. Tomato juice. Tomato and spaghetti sauce. Chili peppers. Onion and garlic. Horseradish. Fruits Oranges, grapefruit, and lemon (fruit and juice). Meats High-fat meats, fish, and poultry. This includes hot dogs, ribs, ham, sausage, salami, and bacon. Dairy Whole milk and chocolate milk. Sour cream. Cream. Butter. Ice cream. Cream cheese.  Beverages Coffee and tea, with or without caffeine. Carbonated beverages or energy drinks. Condiments Hot sauce. Barbecue sauce.  Sweets/Desserts Chocolate and cocoa. Donuts. Peppermint and spearmint. Fats and Oils High-fat foods, including [place] fries and potato chips. Other Vinegar. Strong spices, such as black pepper, white pepper, red pepper, cayenne, curry powder, cloves, ginger, and chili powder. The items listed above may not be a complete list of foods and beverages to avoid. Contact your dietitian for more information. Document  Released: 10/18/2005 Document Revised: 10/23/2013 Document Reviewed: 08/22/2013 [facility] Patient Information 2015 [place], [place]. This information is not intended to replace advice given to you by your health care provider. Make sure you discuss any questions you have with your health care provider.

## 2015-06-24 NOTE — ED Provider Notes (Signed)
CSN: [id number]     Arrival date & time 06/24/15  1706 History   First MD Initiated Contact with Patient 06/24/15 1923     Chief Complaint  Patient presents with  . Gastrophageal Reflux   (Consider location/radiation/quality/duration/timing/severity/associated sxs/prior Treatment) HPI Comments: 69 year old female with a 2-3 year history of GERD. For the past 3 days it is been substantially worse, especially when in the supine position. She can feel acid running up her esophagus and into her throat causing choking and burning. She is taking omeprazole 20 mg twice a day but this does not seem to be helping anymore. She does not eat after 8:00 at night and goes to bed after midnight. She has not tried any other medicines for her symptoms. As of now nothing else is making it better. Lying supine makes it worse. It is a burning type discomfort along with the choking feeling. Is associated with epigastric discomfort. She states her bowel movements are regular and normal. She has had 2 BMs today and states that she is sure that she is not constipated.   Past Medical History  Diagnosis Date  . Hypertension   . Diabetes mellitus    Past Surgical History  Procedure Laterality Date  . Abdominal hysterectomy    . Cholecystectomy    . Hernia repair     History reviewed. No pertinent family history. Social History  Substance Use Topics  . Smoking status: Never Smoker   . Smokeless tobacco: None  . Alcohol Use: No   OB History    No data available     Review of Systems  Constitutional: Positive for activity change. Negative for fever and fatigue.  HENT: Negative.   Respiratory: Negative.  Negative for cough and shortness of breath.   Cardiovascular: Negative for chest pain and leg swelling.  Gastrointestinal:       As per history of present illness  Genitourinary: Negative.   Musculoskeletal: Negative.   Neurological: Negative.   Hematological: Negative.     Allergies   Cephalexin and Codeine  Home Medications   Prior to Admission medications   Medication Sig Start Date End Date Taking? Authorizing Provider  aspirin 81 MG chewable tablet Chew 81 mg by mouth daily.   Yes Historical Provider, MD  furosemide (LASIX) 20 MG tablet Take 20 mg by mouth 2 (two) times a week.   Yes Historical Provider, MD  gabapentin (NEURONTIN) 300 MG capsule Take 300 mg by mouth 3 (three) times daily.   Yes Historical Provider, MD  metFORMIN (GLUCOPHAGE) 500 MG tablet Take 500 mg by mouth daily with breakfast.    Yes Historical Provider, MD  omeprazole (PRILOSEC) 20 MG capsule Take 20 mg by mouth daily.   Yes Historical Provider, MD  Telmisartan-Amlodipine (TWYNSTA) 80-5 MG TABS Take 1 tablet by mouth daily.   Yes Historical Provider, MD  doxycycline (VIBRA-TABS) 100 MG tablet Take 1 tablet (100 mg total) by mouth 2 (two) times daily. 11/08/14   Robyn Haber, MD  sucralfate (CARAFATE) 1 G tablet Take one tablet qid ac and hs for stomach 06/24/15   Janne Napoleon, NP  traMADol (ULTRAM) 50 MG tablet Take 1 tablet (50 mg total) by mouth every 8 (eight) hours as needed. 11/08/14   Robyn Haber, MD   BP 152/82 mmHg  Pulse 76  Temp(Src) 97.8 F (36.6 C) (Oral)  Resp 20  SpO2 98% Physical Exam  Constitutional: She is oriented to person, place, and time. She appears well-developed and well-nourished. No distress.  Neck: Normal range of motion. Neck supple.  Cardiovascular: Normal rate and regular rhythm.   Pulmonary/Chest: Effort normal and breath sounds normal. No respiratory distress.  Abdominal: Bowel sounds are normal. She exhibits distension. She exhibits no mass. There is no rebound and no guarding.  Abdomen is mildly distended. It percusses tympanic quadrants. It is not tense. There is no fluid wave. Greatest tenderness is in the epigastric. There is another generalized tenderness. No rebound or guarding. Bowel sounds are normal active.  Musculoskeletal: She exhibits no edema.   Neurological: She is alert and oriented to person, place, and time. She exhibits normal muscle tone.  Skin: Skin is warm and dry. No erythema.  Psychiatric: She has a normal mood and affect.  Nursing note and vitals reviewed.   ED Course  Procedures (including critical care time) Labs Review Labs Reviewed - No data to display  Imaging Review No results found.   MDM   1. Gastritis   2. Gastroesophageal reflux disease with esophagitis   3. Abdominal gas pain    Take the Carafate before meals and bedtime to coat your stomach Take Zantac 150 mg twice a day along with your omeprazole Call your PCP this week, need referral to GI Read instructions on other care options.   Janne Napoleon, NP 06/24/15 1943

## 2015-06-24 NOTE — ED Notes (Signed)
Pt has been suffering from acid reflux for three days.  Pt states she cant sleep even with three pillows under her head.  She says it feels like it is choking her.  She took two Omeprazole today, with no relief.

## 2015-06-30 DIAGNOSIS — K219 Gastro-esophageal reflux disease without esophagitis: Secondary | ICD-10-CM | POA: Diagnosis not present

## 2015-07-03 DIAGNOSIS — R1013 Epigastric pain: Secondary | ICD-10-CM | POA: Diagnosis not present

## 2015-07-03 DIAGNOSIS — K429 Umbilical hernia without obstruction or gangrene: Secondary | ICD-10-CM | POA: Diagnosis not present

## 2015-07-03 DIAGNOSIS — K760 Fatty (change of) liver, not elsewhere classified: Secondary | ICD-10-CM | POA: Diagnosis not present

## 2015-07-03 DIAGNOSIS — R1310 Dysphagia, unspecified: Secondary | ICD-10-CM | POA: Diagnosis not present

## 2015-07-03 DIAGNOSIS — K219 Gastro-esophageal reflux disease without esophagitis: Secondary | ICD-10-CM | POA: Diagnosis not present

## 2015-07-03 DIAGNOSIS — Z1211 Encounter for screening for malignant neoplasm of colon: Secondary | ICD-10-CM | POA: Diagnosis not present

## 2015-07-28 DIAGNOSIS — K219 Gastro-esophageal reflux disease without esophagitis: Secondary | ICD-10-CM | POA: Diagnosis not present

## 2015-07-28 DIAGNOSIS — K2950 Unspecified chronic gastritis without bleeding: Secondary | ICD-10-CM | POA: Diagnosis not present

## 2015-07-28 DIAGNOSIS — R1310 Dysphagia, unspecified: Secondary | ICD-10-CM | POA: Diagnosis not present

## 2015-07-28 DIAGNOSIS — Z1211 Encounter for screening for malignant neoplasm of colon: Secondary | ICD-10-CM | POA: Diagnosis not present

## 2015-07-28 DIAGNOSIS — K621 Rectal polyp: Secondary | ICD-10-CM | POA: Diagnosis not present

## 2015-07-28 DIAGNOSIS — K635 Polyp of colon: Secondary | ICD-10-CM | POA: Diagnosis not present

## 2015-08-12 ENCOUNTER — Other Ambulatory Visit: Payer: Self-pay | Admitting: Gastroenterology

## 2015-08-12 DIAGNOSIS — K760 Fatty (change of) liver, not elsewhere classified: Secondary | ICD-10-CM | POA: Diagnosis not present

## 2015-08-12 DIAGNOSIS — K219 Gastro-esophageal reflux disease without esophagitis: Secondary | ICD-10-CM | POA: Diagnosis not present

## 2015-08-12 DIAGNOSIS — R1033 Periumbilical pain: Secondary | ICD-10-CM | POA: Diagnosis not present

## 2015-08-21 ENCOUNTER — Other Ambulatory Visit: Payer: Self-pay

## 2015-08-21 DIAGNOSIS — Z1231 Encounter for screening mammogram for malignant neoplasm of breast: Secondary | ICD-10-CM

## 2015-08-22 ENCOUNTER — Encounter (HOSPITAL_COMMUNITY): Payer: Self-pay

## 2015-08-22 ENCOUNTER — Other Ambulatory Visit: Payer: Self-pay | Admitting: Gastroenterology

## 2015-08-22 ENCOUNTER — Ambulatory Visit
Admission: RE | Admit: 2015-08-22 | Discharge: 2015-08-22 | Disposition: A | Discharge status: Home / Self Care | Payer: Medicare Other | Source: Ambulatory Visit | Attending: Gastroenterology | Admitting: Gastroenterology

## 2015-08-22 ENCOUNTER — Ambulatory Visit (HOSPITAL_COMMUNITY)
Admission: RE | Admit: 2015-08-22 | Discharge: 2015-08-22 | Disposition: A | Discharge status: Home / Self Care | Payer: Medicare Other | Source: Ambulatory Visit | Attending: Gastroenterology | Admitting: Gastroenterology

## 2015-08-22 DIAGNOSIS — Z9049 Acquired absence of other specified parts of digestive tract: Secondary | ICD-10-CM | POA: Insufficient documentation

## 2015-08-22 DIAGNOSIS — R1033 Periumbilical pain: Secondary | ICD-10-CM | POA: Diagnosis not present

## 2015-08-22 DIAGNOSIS — K429 Umbilical hernia without obstruction or gangrene: Secondary | ICD-10-CM | POA: Diagnosis not present

## 2015-08-22 DIAGNOSIS — K760 Fatty (change of) liver, not elsewhere classified: Secondary | ICD-10-CM | POA: Diagnosis not present

## 2015-08-22 DIAGNOSIS — Z90710 Acquired absence of both cervix and uterus: Secondary | ICD-10-CM | POA: Insufficient documentation

## 2015-08-22 IMAGING — CT CT ABD-PELV W/ CM
2 of 5 series · 10 of 46 positions shown, 11 images · IV contrast (Iodine)
Comparison: 08/01/2014

CLINICAL DATA: Periumbilical abdominal pain for 1 month.

EXAM:
CT ABDOMEN AND PELVIS WITH CONTRAST
TECHNIQUE: Multidetector CT imaging of the abdomen and pelvis was performed
using the standard protocol following bolus administration of
intravenous contrast.
CONTRAST:  100mL OMNIPAQUE IOHEXOL 300 MG/ML  SOLN

[Series 201: routine, idose (2) · axial · 0.85mm/px · z∈[-410,-45]mm · 7 of 97 slices shown, 8 images]
[im 12/97  soft-tissue]
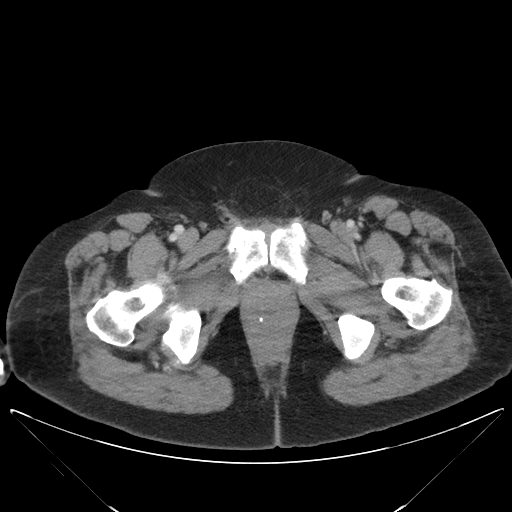
[im 12/97  bone]
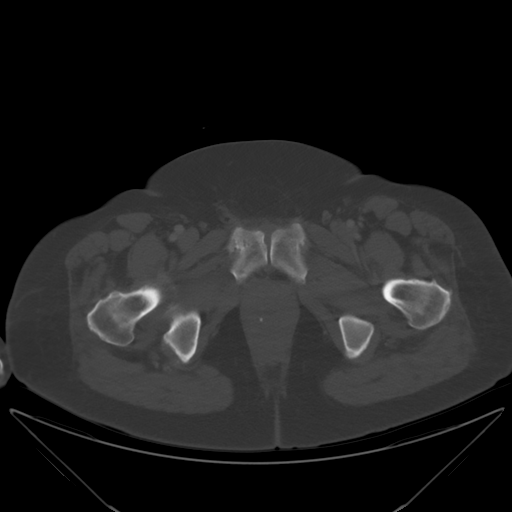
[im 23/97  soft-tissue]
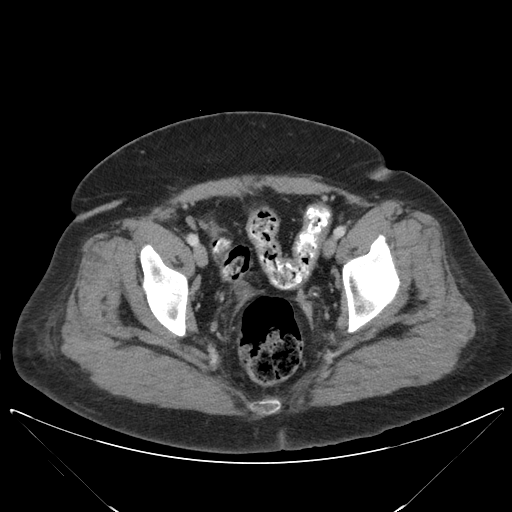
[im 34/97  soft-tissue]
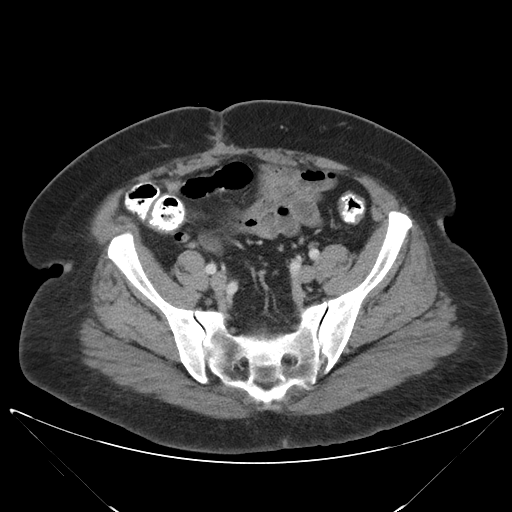
[im 51/97  soft-tissue]
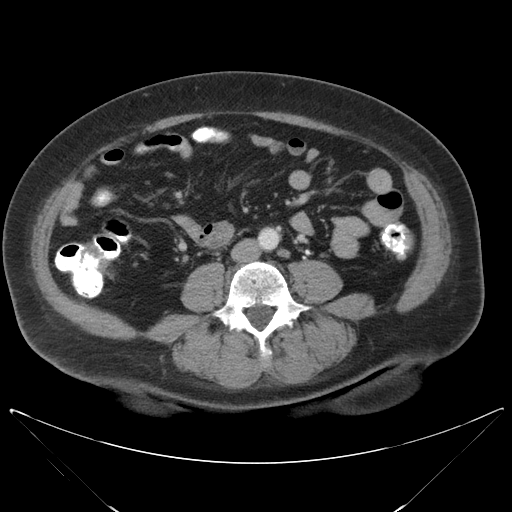
[im 63/97  soft-tissue]
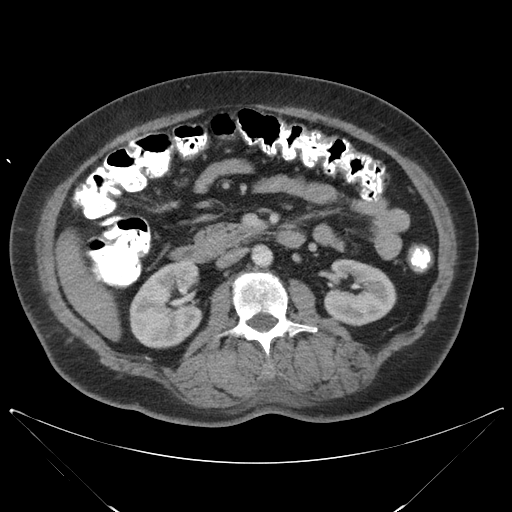
[im 74/97  soft-tissue]
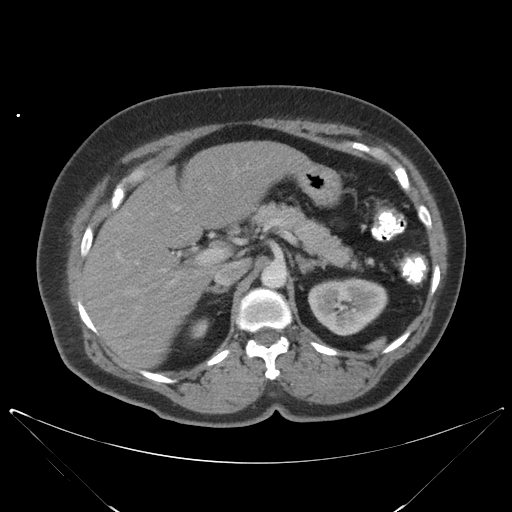
[im 85/97  soft-tissue]
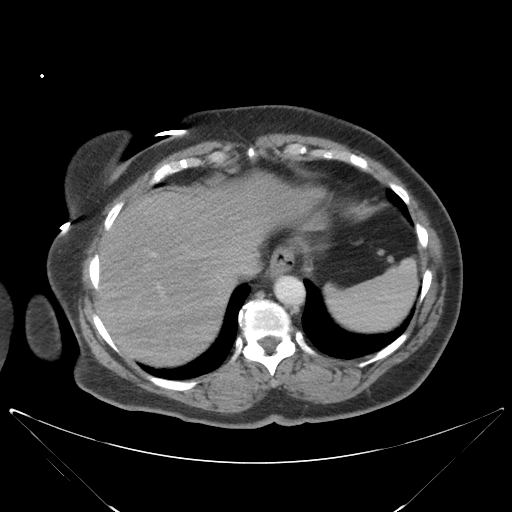

[Series 203: coronals, idose (2) · coronal · 0.45mm/px · 3 of 138 slices shown]
[im 46/138  soft-tissue]
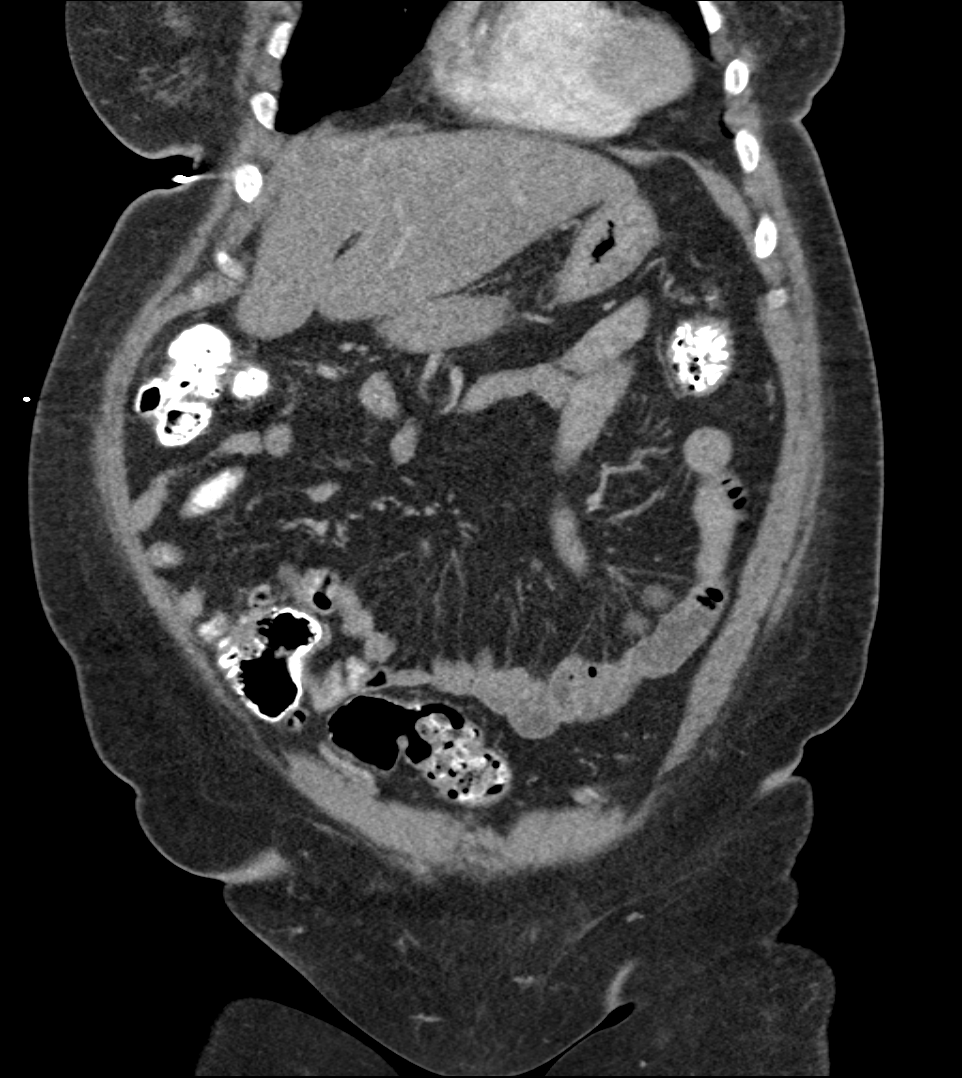
[im 61/138  soft-tissue]
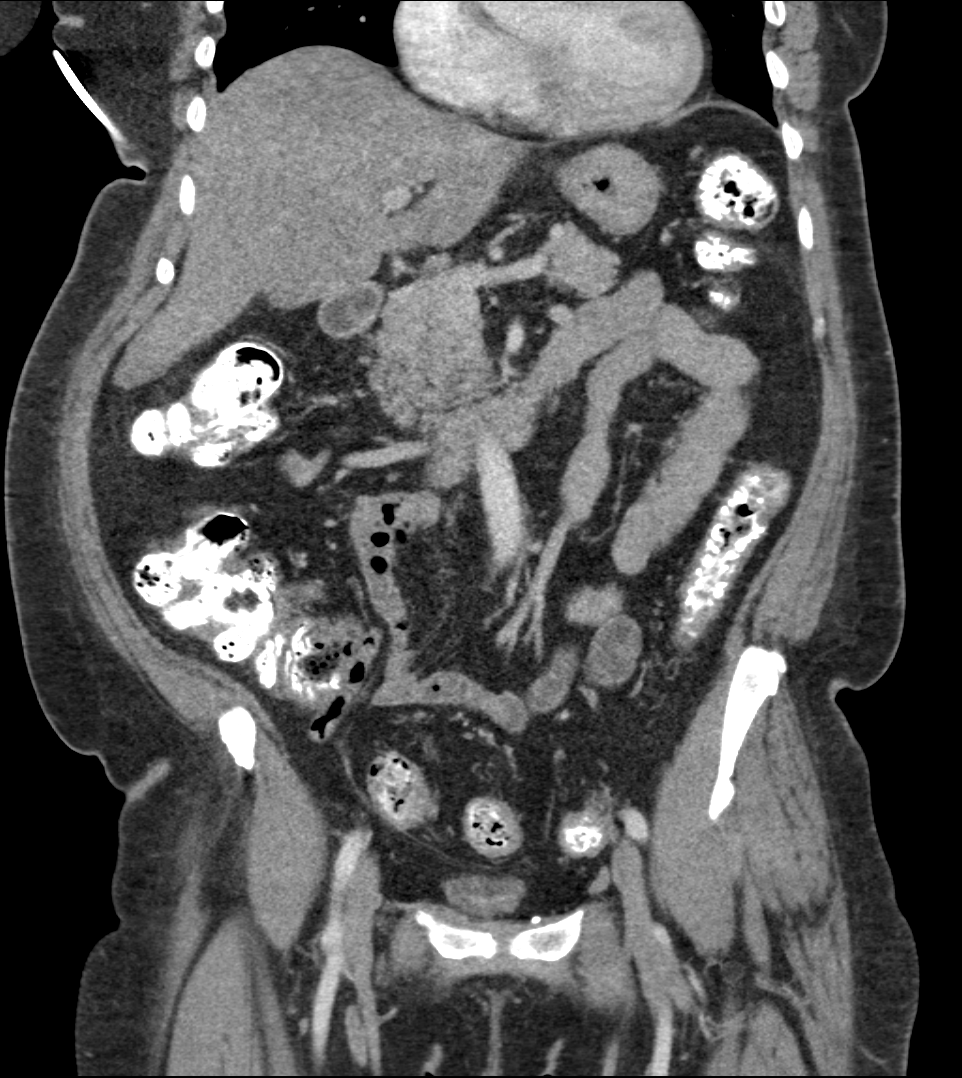
[im 77/138  soft-tissue]
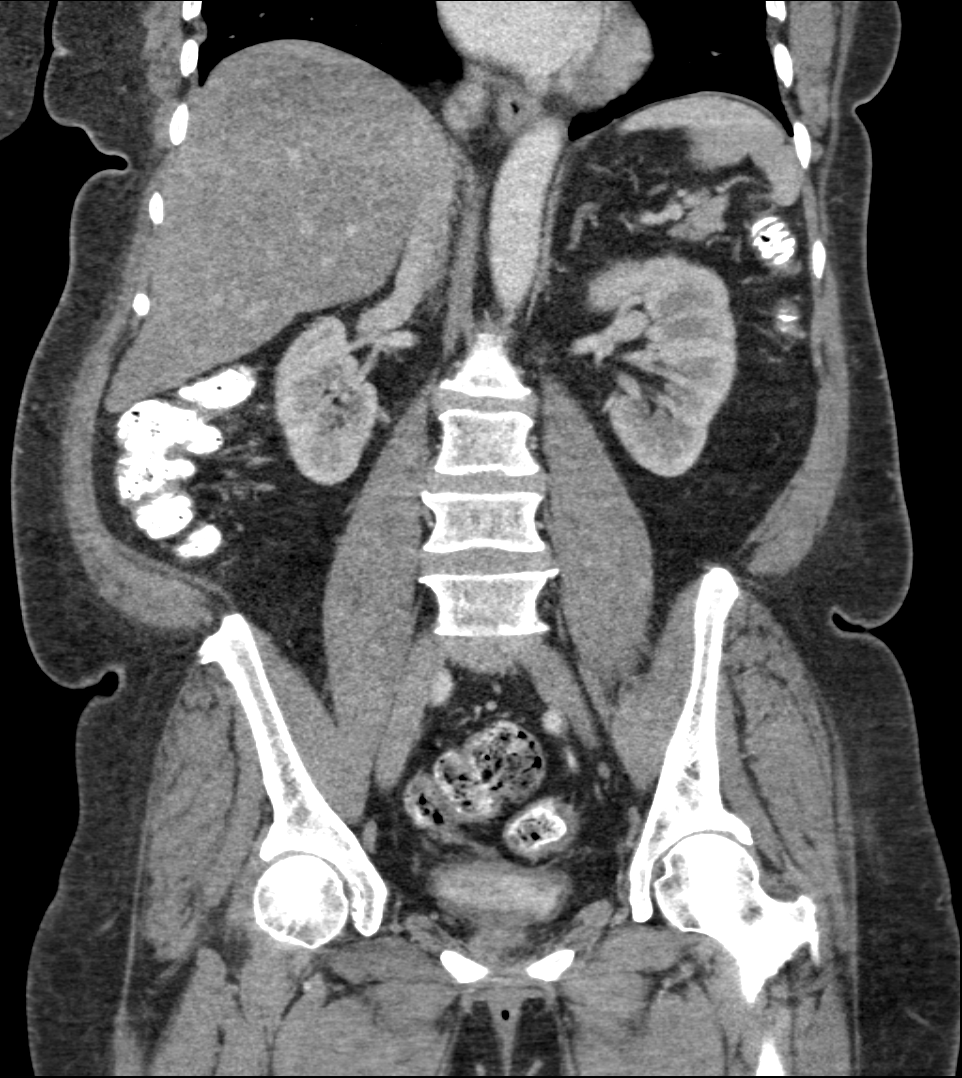

[10 of 46 positions shown; findings below may reference images not displayed]

FINDINGS: Lower chest:  No acute findings.

Hepatobiliary: Diffuse hepatic steatosis again noted. No liver
masses identified. Prior cholecystectomy noted. No evidence of
biliary dilatation.

Pancreas: No mass, inflammatory changes, or other significant
abnormality.

Spleen: Within normal limits in size and appearance.

Adrenals/Urinary Tract: No masses identified. No evidence of
hydronephrosis.

Stomach/Bowel: Tiny hiatal hernia noted. No evidence of obstruction,
inflammatory process, or abnormal fluid collections. Normal appendix
visualized.

Vascular/Lymphatic: No pathologically enlarged lymph nodes. No
evidence of abdominal aortic aneurysm.

Reproductive: Prior hysterectomy noted. Adnexal regions are
unremarkable in appearance.

Other: Small periumbilical hernia again seen containing only fat. No
evidence herniated bowel loops.

Musculoskeletal:  No suspicious bone lesions identified.
IMPRESSION: No acute findings.

Stable tiny periumbilical hernia containing only fat. Probable tiny
hiatal hernia also noted.

Diffuse hepatic steatosis.

## 2015-08-22 IMAGING — CT CT ABD-PELV W/ CM
1 series · 1 of 1 positions shown · non-contrast
Comparison: none

[Series 1: topogram 0.6 t20f · coronal · 0.6mm · 1.00mm/px · 1 of 1 slices shown]
[im 1/1]
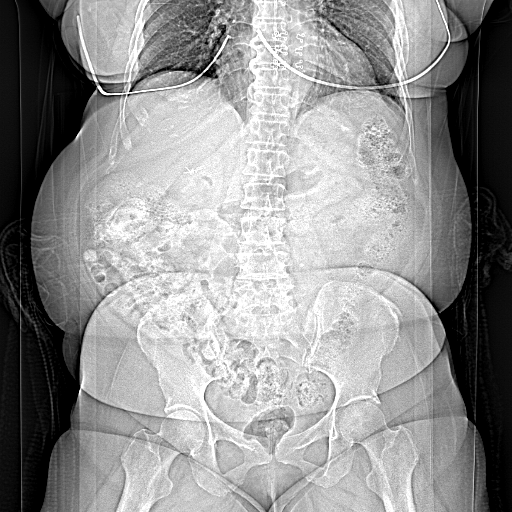

[1 of 1 positions shown; findings below may reference images not displayed]

Canned report from images found in remote index.

Refer to host system for actual result text.

## 2015-08-22 MED ORDER — IOHEXOL 300 MG/ML  SOLN
100.0000 mL | Freq: Once | INTRAMUSCULAR | Status: AC | PRN
  Administered 2015-08-22: 100 mL via INTRAVENOUS

## 2015-09-15 ENCOUNTER — Ambulatory Visit
Admission: RE | Admit: 2015-09-15 | Discharge: 2015-09-15 | Disposition: A | Discharge status: Home / Self Care | Payer: Medicare Other | Source: Ambulatory Visit

## 2015-09-15 DIAGNOSIS — Z1231 Encounter for screening mammogram for malignant neoplasm of breast: Secondary | ICD-10-CM

## 2015-09-17 ENCOUNTER — Other Ambulatory Visit: Payer: Self-pay | Admitting: Internal Medicine

## 2015-09-17 DIAGNOSIS — R928 Other abnormal and inconclusive findings on diagnostic imaging of breast: Secondary | ICD-10-CM

## 2015-09-24 ENCOUNTER — Ambulatory Visit
Admission: RE | Admit: 2015-09-24 | Discharge: 2015-09-24 | Disposition: A | Discharge status: Home / Self Care | Payer: Medicare Other | Source: Ambulatory Visit | Attending: Internal Medicine | Admitting: Internal Medicine

## 2015-09-24 DIAGNOSIS — R928 Other abnormal and inconclusive findings on diagnostic imaging of breast: Secondary | ICD-10-CM

## 2015-10-17 DIAGNOSIS — H35372 Puckering of macula, left eye: Secondary | ICD-10-CM | POA: Diagnosis not present

## 2015-10-17 DIAGNOSIS — H2513 Age-related nuclear cataract, bilateral: Secondary | ICD-10-CM | POA: Diagnosis not present

## 2015-10-17 DIAGNOSIS — H10413 Chronic giant papillary conjunctivitis, bilateral: Secondary | ICD-10-CM | POA: Diagnosis not present

## 2015-10-26 ENCOUNTER — Emergency Department (HOSPITAL_BASED_OUTPATIENT_CLINIC_OR_DEPARTMENT_OTHER): Payer: Medicare Other

## 2015-10-26 ENCOUNTER — Other Ambulatory Visit: Payer: Self-pay

## 2015-10-26 ENCOUNTER — Encounter (HOSPITAL_BASED_OUTPATIENT_CLINIC_OR_DEPARTMENT_OTHER): Payer: Self-pay | Admitting: *Deleted

## 2015-10-26 ENCOUNTER — Emergency Department (HOSPITAL_BASED_OUTPATIENT_CLINIC_OR_DEPARTMENT_OTHER)
Admission: EM | Admit: 2015-10-26 | Discharge: 2015-10-26 | Disposition: A | Discharge status: Home / Self Care | Payer: Medicare Other | Attending: Emergency Medicine | Admitting: Emergency Medicine

## 2015-10-26 DIAGNOSIS — R252 Cramp and spasm: Secondary | ICD-10-CM | POA: Diagnosis not present

## 2015-10-26 DIAGNOSIS — I10 Essential (primary) hypertension: Secondary | ICD-10-CM | POA: Insufficient documentation

## 2015-10-26 DIAGNOSIS — Z7982 Long term (current) use of aspirin: Secondary | ICD-10-CM | POA: Insufficient documentation

## 2015-10-26 DIAGNOSIS — Z7984 Long term (current) use of oral hypoglycemic drugs: Secondary | ICD-10-CM | POA: Insufficient documentation

## 2015-10-26 DIAGNOSIS — R079 Chest pain, unspecified: Secondary | ICD-10-CM | POA: Diagnosis not present

## 2015-10-26 DIAGNOSIS — E119 Type 2 diabetes mellitus without complications: Secondary | ICD-10-CM | POA: Insufficient documentation

## 2015-10-26 DIAGNOSIS — Z79899 Other long term (current) drug therapy: Secondary | ICD-10-CM | POA: Diagnosis not present

## 2015-10-26 DIAGNOSIS — Z792 Long term (current) use of antibiotics: Secondary | ICD-10-CM | POA: Diagnosis not present

## 2015-10-26 DIAGNOSIS — R0789 Other chest pain: Secondary | ICD-10-CM | POA: Diagnosis not present

## 2015-10-26 LAB — CBC
HEMATOCRIT: 39.3 % (ref 36.0–46.0)
HEMOGLOBIN: 12.7 g/dL (ref 12.0–15.0)
MCH: 29.3 pg (ref 26.0–34.0)
MCHC: 32.3 g/dL (ref 30.0–36.0)
MCV: 90.8 fL (ref 78.0–100.0)
PLATELETS: 237 10*3/uL (ref 150–400)
RBC: 4.33 MIL/uL (ref 3.87–5.11)
RDW: 14.5 % (ref 11.5–15.5)
WBC: 5.1 10*3/uL (ref 4.0–10.5)

## 2015-10-26 LAB — BASIC METABOLIC PANEL
ANION GAP: 5 (ref 5–15)
BUN: 13 mg/dL (ref 6–20)
CO2: 28 mmol/L (ref 22–32)
Calcium: 8.5 mg/dL — ABNORMAL LOW (ref 8.9–10.3)
Chloride: 108 mmol/L (ref 101–111)
Creatinine, Ser: 0.63 mg/dL (ref 0.44–1.00)
GFR calc Af Amer: >60 mL/min (ref >60)
GFR calc non Af Amer: >60 mL/min (ref >60)
GLUCOSE: 153 mg/dL — AB (ref 65–99)
POTASSIUM: 3.8 mmol/L (ref 3.5–5.1)
Sodium: 141 mmol/L (ref 135–145)

## 2015-10-26 LAB — TROPONIN I: Troponin I: <0.03 ng/mL (ref <0.031)

## 2015-10-26 IMAGING — DX DG CHEST 2V
2 series · 2 of 2 positions shown · non-contrast
Comparison: 06/15/2014 and 10/18/2013.

CLINICAL DATA: Left-sided chest pain for 1 week, worsening today.
History of hypertension and diabetes.

EXAM:
CHEST  2 VIEW

[chest pa]
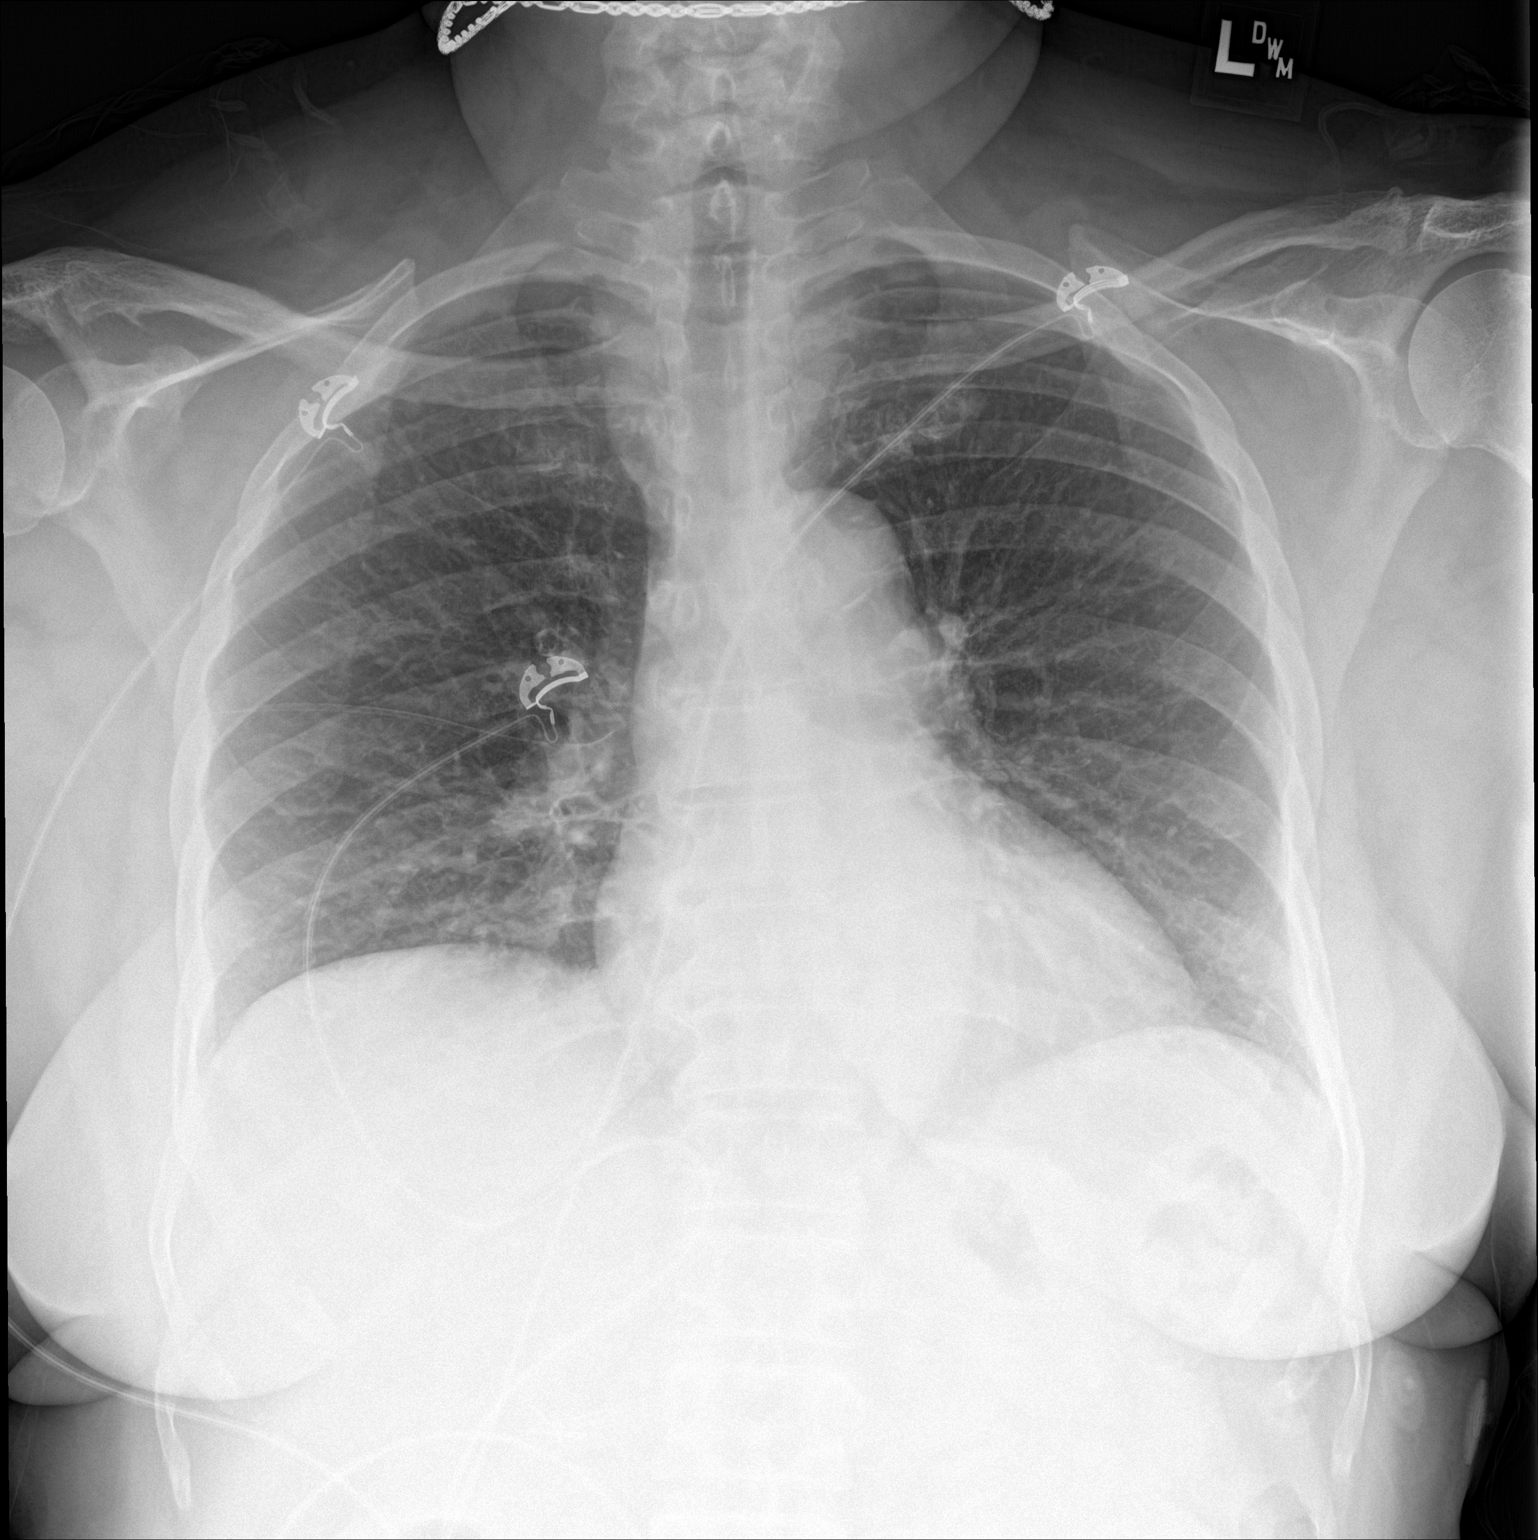

[chest lat]
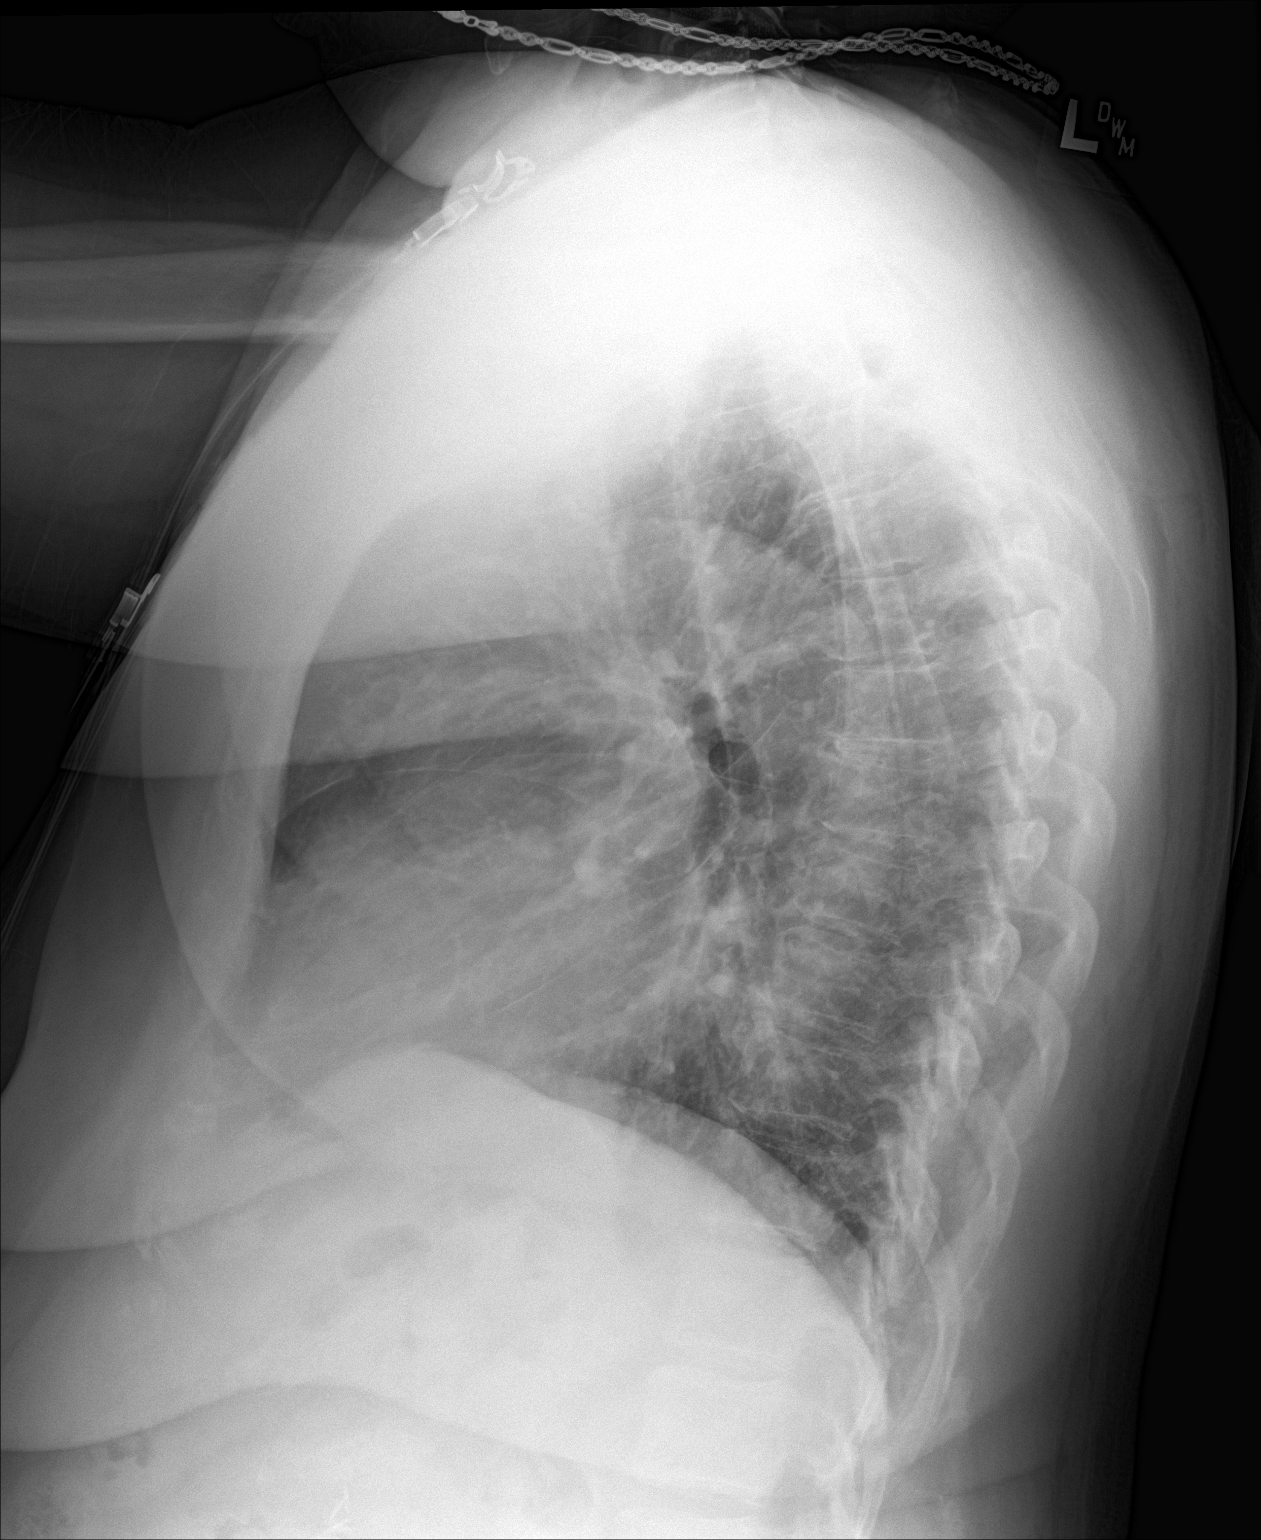

[2 of 2 positions shown; findings below may reference images not displayed]

FINDINGS: The heart size and mediastinal contours are stable. Right
paratracheal density appears unchanged, attributed to vascular
structures. I believe there is mild chronic central airway
thickening. There is no confluent airspace opacity, edema or pleural
effusion. The bones appear unchanged.
IMPRESSION: No acute cardiopulmonary process. Probable chronic central airway
thickening.

## 2015-10-26 MED ORDER — NITROGLYCERIN 0.4 MG SL SUBL: 0.4000 mg | SUBLINGUAL_TABLET | SUBLINGUAL | Status: DC | PRN

## 2015-10-26 MED ORDER — IBUPROFEN 800 MG PO TABS
800.0000 mg | ORAL_TABLET | Freq: Once | ORAL | Status: AC
  Administered 2015-10-26: 800 mg via ORAL
  Filled 2015-10-26: qty 1

## 2015-10-26 NOTE — ED Notes (Signed)
MD at bedside. 

## 2015-10-26 NOTE — Discharge Instructions (Signed)
Ms. SHERRILYNN ROSELLE,  Nice meeting you! Please follow-up with your primary care provider regarding this chest pain visit. You may take 800 mg ibuprofen three times a day with meals for your pain for up to two weeks. Return to the emergency department if you develop chest pain again, shortness of breath, nausea/vomiting, or become sweaty. Feel better soon!  S. Wendie Simmer, PA-C

## 2015-10-26 NOTE — ED Notes (Signed)
Pt in exam room, placed on cont cardiac monitor with cont POX and int NBP

## 2015-10-26 NOTE — ED Notes (Signed)
Chest  Pain x 1 week, intermittent- states pain has "been there all night"- has taken a hot shower without relief- states she has not taken her medications this morning

## 2015-10-26 NOTE — ED Notes (Signed)
Pt returns from radiology, chest pain reassessed

## 2015-10-26 NOTE — ED Provider Notes (Signed)
CSN: [id number]     Arrival date & time 10/26/15  1158 History   First MD Initiated Contact with Patient 10/26/15 1210     Chief Complaint  Patient presents with  . Chest Pain   HPI Diane [name] is a 69 y.o. F PMH significant for HTN, DM presenting with a one week history of CP. She describes the chest pain as similar to muscle cramps she has had in the past but more severe, 8/10 pain scale, radiating to her left arm, sharp, worse when laying down. She denies fevers, chills, abdominal pain, recent surgeries, exogenous estrogen, extremity swelling, personal or family history of clotting disorders,  N/V/D.  Past Medical History  Diagnosis Date  . Hypertension   . Diabetes mellitus    Past Surgical History  Procedure Laterality Date  . Abdominal hysterectomy    . Cholecystectomy    . Hernia repair     No family history on file. Social History  Substance Use Topics  . Smoking status: Never Smoker   . Smokeless tobacco: None  . Alcohol Use: No   OB History    No data available     Review of Systems  Ten systems are reviewed and are negative for acute change except as noted in the HPI   Allergies  Cephalexin and Codeine  Home Medications   Prior to Admission medications   Medication Sig Start Date End Date Taking? Authorizing Provider  aspirin 81 MG chewable tablet Chew 81 mg by mouth daily.   Yes Historical Provider, MD  furosemide (LASIX) 20 MG tablet Take 20 mg by mouth 2 (two) times a week.   Yes Historical Provider, MD  gabapentin (NEURONTIN) 300 MG capsule Take 300 mg by mouth 3 (three) times daily.   Yes Historical Provider, MD  metFORMIN (GLUCOPHAGE) 500 MG tablet Take 500 mg by mouth daily with breakfast.    Yes Historical Provider, MD  omeprazole (PRILOSEC) 20 MG capsule Take 20 mg by mouth daily.   Yes Historical Provider, MD  Telmisartan-Amlodipine (TWYNSTA) 80-5 MG TABS Take 1 tablet by mouth daily.   Yes Historical Provider, MD  doxycycline (VIBRA-TABS) 100  MG tablet Take 1 tablet (100 mg total) by mouth 2 (two) times daily. 11/08/14   Robyn Haber, MD  sucralfate (CARAFATE) 1 G tablet Take one tablet qid ac and hs for stomach 06/24/15   Janne Napoleon, NP  traMADol (ULTRAM) 50 MG tablet Take 1 tablet (50 mg total) by mouth every 8 (eight) hours as needed. 11/08/14   Robyn Haber, MD   BP 179/87 mmHg  Pulse 78  Temp(Src) 98.4 F (36.9 C) (Oral)  Resp 18  SpO2 99% Physical Exam  Constitutional: She appears well-developed and well-nourished. No distress.  HENT:  Head: Normocephalic and atraumatic.  Mouth/Throat: Oropharynx is clear and moist. No oropharyngeal exudate.  Eyes: Conjunctivae are normal. Pupils are equal, round, and reactive to light. Right eye exhibits no discharge. Left eye exhibits no discharge. No scleral icterus.  Neck: No tracheal deviation present.  Cardiovascular: Normal rate, regular rhythm, normal heart sounds and intact distal pulses.  Exam reveals no gallop and no friction rub.   No murmur heard. Pulmonary/Chest: Effort normal and breath sounds normal. No respiratory distress. She has no wheezes. She has no rales. She exhibits no tenderness.  Abdominal: Soft. Bowel sounds are normal. She exhibits no distension and no mass. There is no tenderness. There is no rebound and no guarding.  Musculoskeletal: She exhibits no edema.  Lymphadenopathy:    She has no cervical adenopathy.  Neurological: She is alert. Coordination normal.  Skin: Skin is warm and dry. No rash noted. She is not diaphoretic. No erythema.  Psychiatric: She has a normal mood and affect. Her behavior is normal.  Nursing note and vitals reviewed.   ED Course  Procedures (including critical care time) Labs Review Labs Reviewed  BASIC METABOLIC PANEL - Abnormal; Notable for the following:    Glucose, Bld 153 (*)    Calcium 8.5 (*)    All other components within normal limits  CBC  TROPONIN I  TROPONIN I   Imaging Review Dg Chest 2  View  10/26/2015  CLINICAL DATA:  Left-sided chest pain for 1 week, worsening today. History of hypertension and diabetes. EXAM: CHEST  2 VIEW COMPARISON:  06/15/2014 and 10/18/2013. FINDINGS: The heart size and mediastinal contours are stable. Right paratracheal density appears unchanged, attributed to vascular structures. I believe there is mild chronic central airway thickening. There is no confluent airspace opacity, edema or pleural effusion. The bones appear unchanged. IMPRESSION: No acute cardiopulmonary process. Probable chronic central airway thickening. Electronically Signed   By: Richardean Sale M.D.   On: 10/26/2015 12:43   I have personally reviewed and evaluated these images and lab results as part of my medical decision-making.   EKG Interpretation   Date/Time:  Sunday October 26 2015 12:01:17 EST Ventricular Rate:  86 PR Interval:  158 QRS Duration: 86 QT Interval:  384 QTC Calculation: 459 R Axis:   29 Text Interpretation:  Normal sinus rhythm Baseline wander Interpretation  limited secondary to artifact  in v1  No significant change since last  tracing Confirmed by KNOTT MD, DANIEL [id number]) on 10/26/2015 12:16:39 PM      MDM   Final diagnoses:  Chest pain, unspecified chest pain type   Hypertensive, patient did not take HTN meds today (unsure of what she takes). Based on patient history and physical exam, most likely etiologies include ACS vs MSK. Less likely etiologies include  Prinzmetal's/cocaine-induced angina, pericarditis/pericardial effusion, cardiac tamponade, constrictive pericarditis, myocarditis, aortic dissection, thoracic aortic aneurysm, CHF/acute pulmonary edema,  pneumonia, pleuritis, pneumothorax, tension pneumothorax, pulmonary embolism, pulmonary HTN, GERD, esophageal spasm, Mallory-Weiss tear, Boerhaave syndrome, peptic ulcer diease, biliary disease, pancreatitis, herpes zoster, anxiety, sickle cell chest crisis. Troponin x 2 negative. Hyperglycemia  of 153. Unremarkable CBC.  CXR and EKG negative for acute changes.  Patient may be safely discharged home. Discussed reasons for return. Patient to follow-up with primary care provider within one week. Patient in understanding and agreement with the plan.    [place], PA-C 11/08/15 1837  Leo Grosser, MD 11/09/15 1155

## 2015-10-26 NOTE — ED Notes (Signed)
Resting quietly, cont to deny any further chest pain. Denies any discomfort at all. Cont on cardiac monitor

## 2015-10-26 NOTE — ED Notes (Signed)
Pt d/c home with family member- ambulatory at d/c with stable gait

## 2015-10-26 NOTE — ED Notes (Signed)
Family at bedside. 

## 2015-10-30 DIAGNOSIS — M25511 Pain in right shoulder: Secondary | ICD-10-CM | POA: Diagnosis not present

## 2015-10-30 DIAGNOSIS — R1031 Right lower quadrant pain: Secondary | ICD-10-CM | POA: Diagnosis not present

## 2015-11-02 HISTORY — PX: CATARACT EXTRACTION W/ INTRAOCULAR LENS IMPLANT: SHX1309

## 2015-12-04 DIAGNOSIS — M25511 Pain in right shoulder: Secondary | ICD-10-CM | POA: Diagnosis not present

## 2015-12-09 DIAGNOSIS — H25811 Combined forms of age-related cataract, right eye: Secondary | ICD-10-CM | POA: Diagnosis not present

## 2015-12-09 DIAGNOSIS — H35372 Puckering of macula, left eye: Secondary | ICD-10-CM | POA: Diagnosis not present

## 2015-12-09 DIAGNOSIS — H40012 Open angle with borderline findings, low risk, left eye: Secondary | ICD-10-CM | POA: Diagnosis not present

## 2015-12-12 DIAGNOSIS — M25511 Pain in right shoulder: Secondary | ICD-10-CM | POA: Diagnosis not present

## 2015-12-17 DIAGNOSIS — M25511 Pain in right shoulder: Secondary | ICD-10-CM | POA: Diagnosis not present

## 2016-01-01 DIAGNOSIS — M792 Neuralgia and neuritis, unspecified: Secondary | ICD-10-CM | POA: Diagnosis not present

## 2016-01-02 DIAGNOSIS — J029 Acute pharyngitis, unspecified: Secondary | ICD-10-CM | POA: Diagnosis not present

## 2016-01-02 DIAGNOSIS — H6690 Otitis media, unspecified, unspecified ear: Secondary | ICD-10-CM | POA: Diagnosis not present

## 2016-01-02 DIAGNOSIS — J069 Acute upper respiratory infection, unspecified: Secondary | ICD-10-CM | POA: Diagnosis not present

## 2016-01-08 DIAGNOSIS — J189 Pneumonia, unspecified organism: Secondary | ICD-10-CM | POA: Diagnosis not present

## 2016-01-15 DIAGNOSIS — J309 Allergic rhinitis, unspecified: Secondary | ICD-10-CM | POA: Diagnosis not present

## 2016-01-19 ENCOUNTER — Other Ambulatory Visit: Payer: Self-pay | Admitting: Internal Medicine

## 2016-01-19 DIAGNOSIS — T17310A Gastric contents in larynx causing asphyxiation, initial encounter: Secondary | ICD-10-CM

## 2016-01-20 ENCOUNTER — Ambulatory Visit
Admission: RE | Admit: 2016-01-20 | Discharge: 2016-01-20 | Disposition: A | Discharge status: Home / Self Care | Payer: Medicare Other | Source: Ambulatory Visit | Attending: Internal Medicine | Admitting: Internal Medicine

## 2016-01-20 DIAGNOSIS — R42 Dizziness and giddiness: Secondary | ICD-10-CM | POA: Diagnosis not present

## 2016-01-20 DIAGNOSIS — T17310A Gastric contents in larynx causing asphyxiation, initial encounter: Secondary | ICD-10-CM

## 2016-01-20 DIAGNOSIS — E041 Nontoxic single thyroid nodule: Secondary | ICD-10-CM | POA: Diagnosis not present

## 2016-01-20 IMAGING — US US THYROID
1 series · 14 of 25 positions shown · non-contrast
Comparison: None.

CLINICAL DATA: Choking sensation.

EXAM:
THYROID ULTRASOUND
TECHNIQUE: Ultrasound examination of the thyroid gland and adjacent soft
tissues was performed.

[Series 1: us thyroid · 0.07mm/px · 14 of 32 slices shown]
[im 1/32]
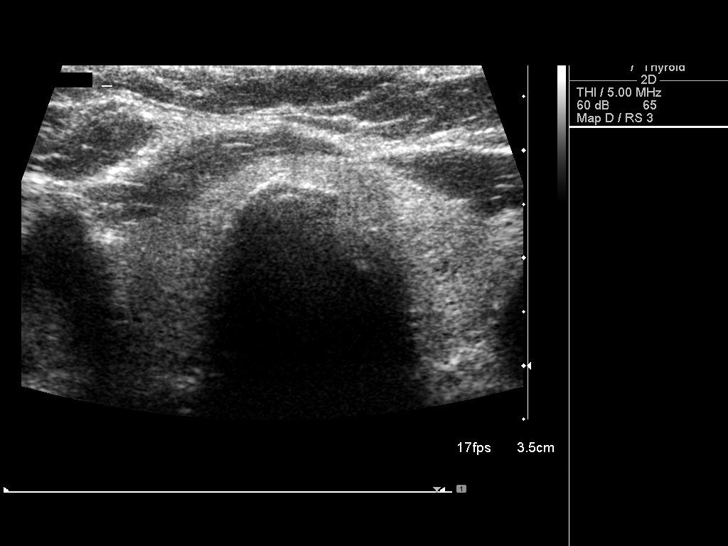
[im 3/32]
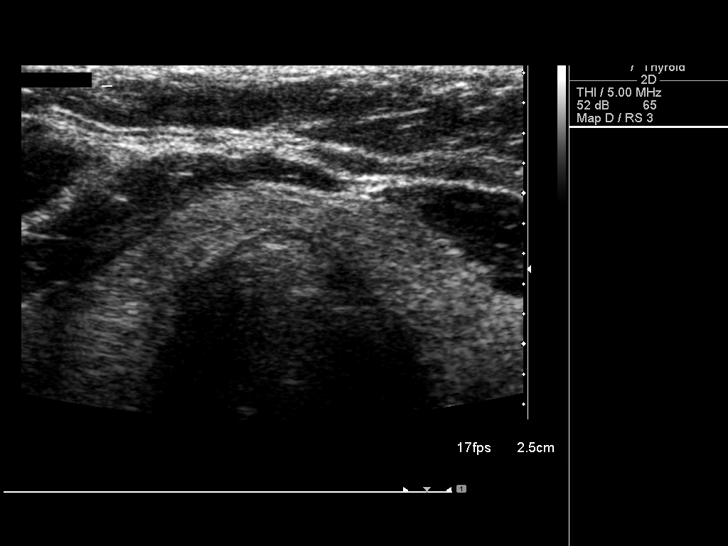
[im 6/32]
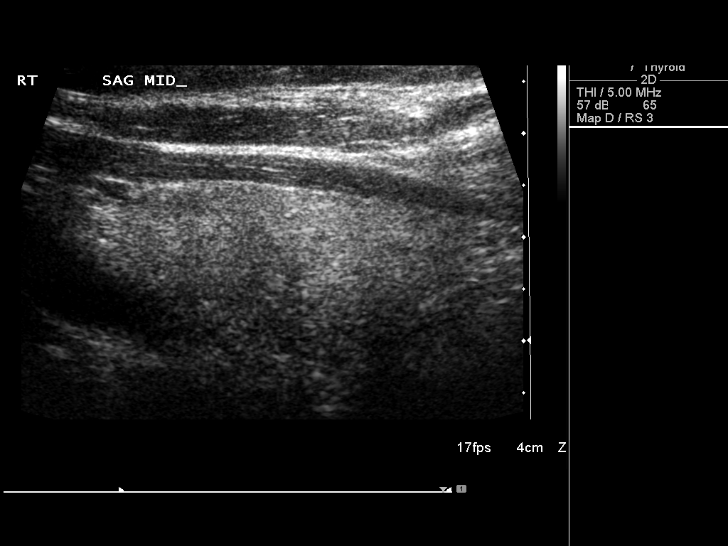
[im 8/32]
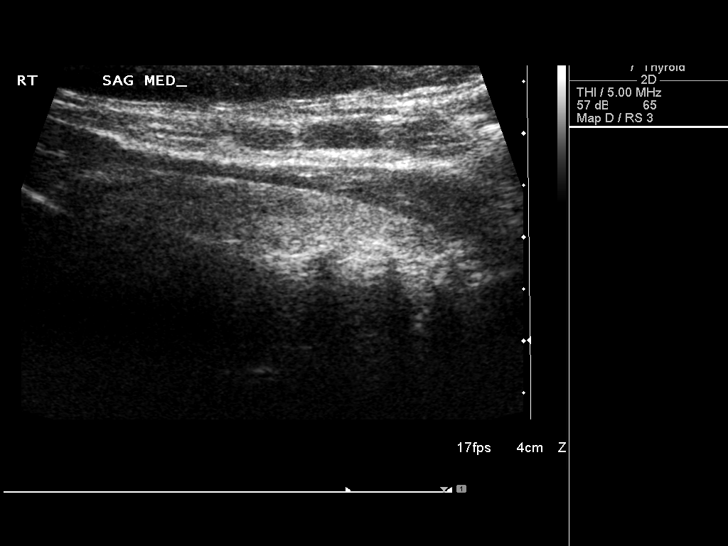
[im 11/32]
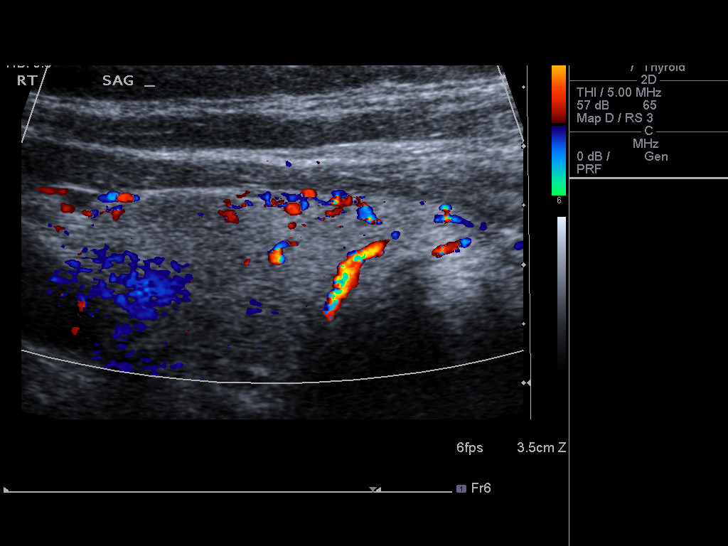
[im 12/32]
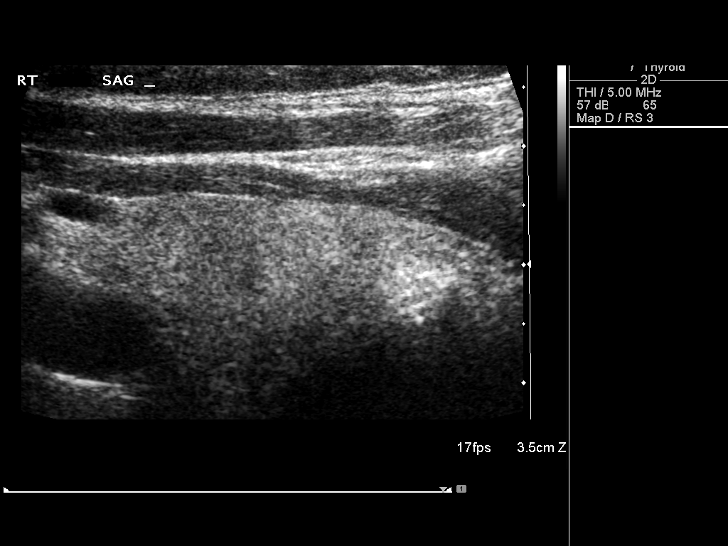
[im 15/32]
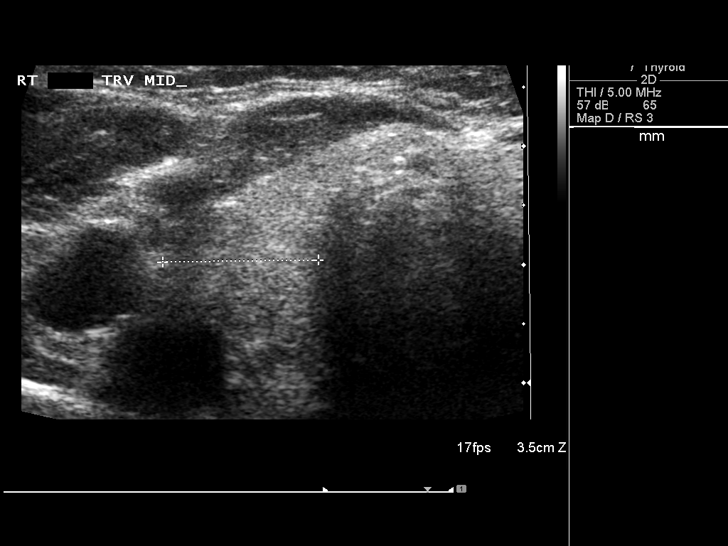
[im 17/32]
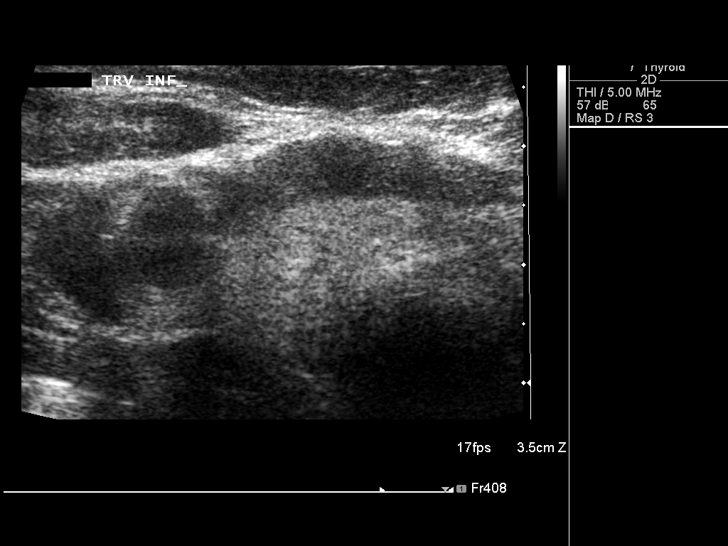
[im 20/32]
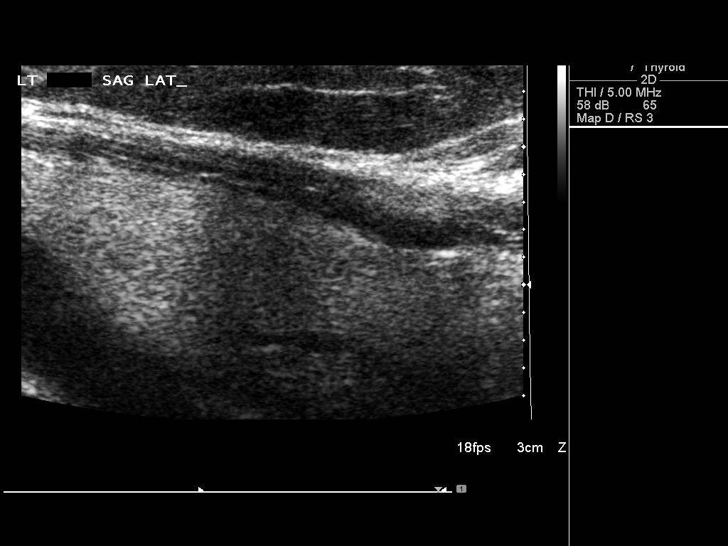
[im 21/32]
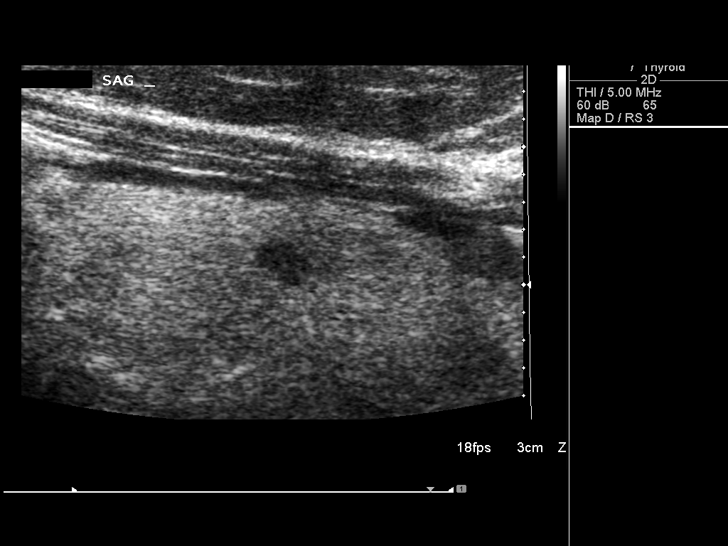
[im 24/32]
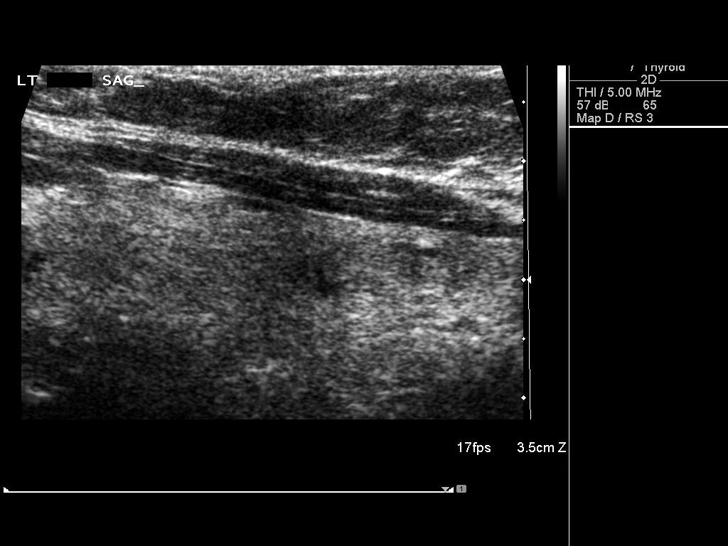
[im 26/32]
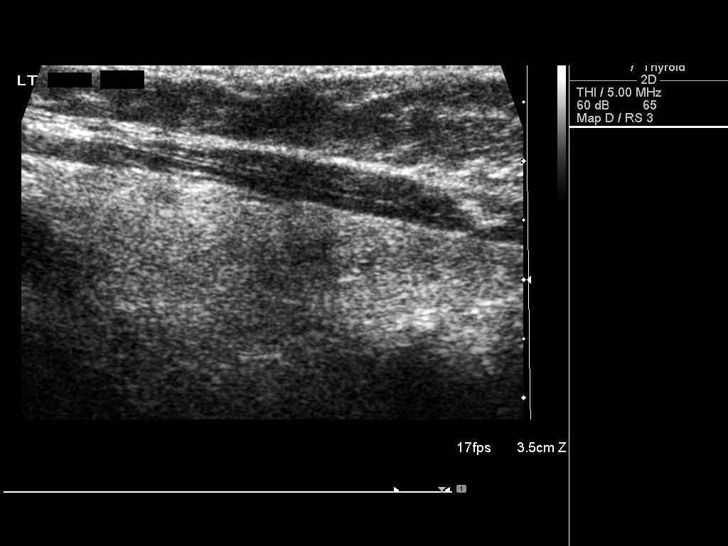
[im 29/32]
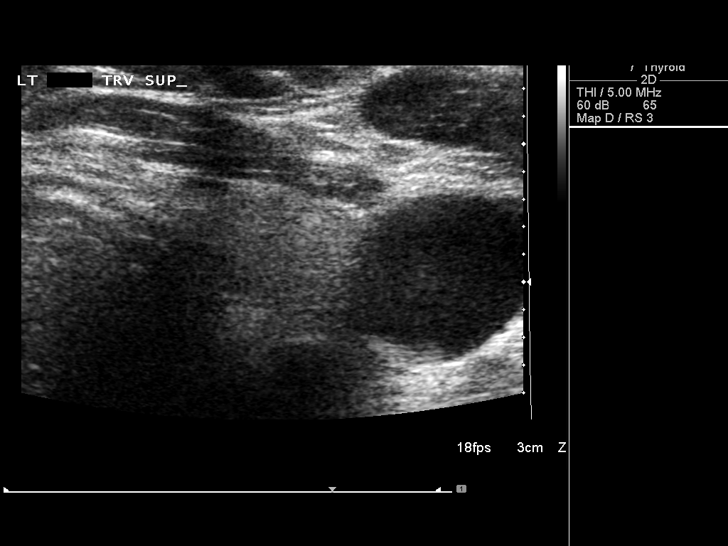
[im 32/32]
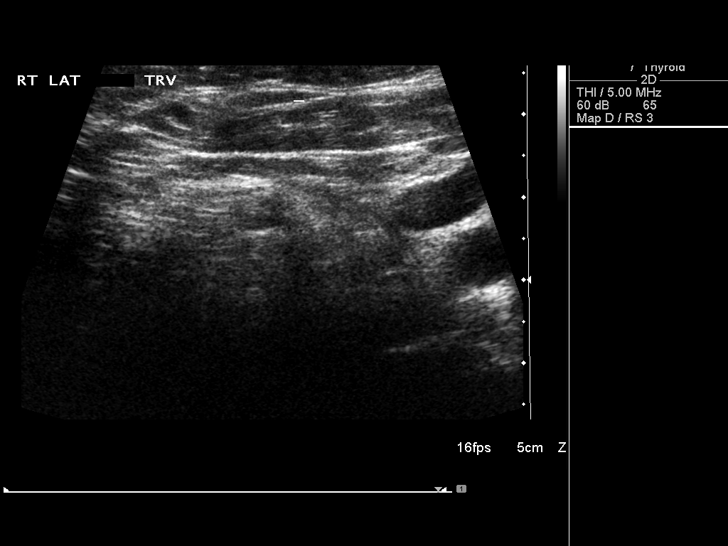

[14 of 25 positions shown; findings below may reference images not displayed]

FINDINGS: Right thyroid lobe

Measurements: 4.0 x 1.7 x 1.3 cm. Right thyroid tissue is mildly
heterogeneous without a focal nodule.

Left thyroid lobe

Measurements: 4.1 x 1.4 x 1.6 cm. Left thyroid tissue is mildly
heterogeneous with a 0.4 cm nodule in the midportion. No other
thyroid nodules.

Isthmus

Thickness: 0.3 cm.  No nodules visualized.

Lymphadenopathy

None visualized.
IMPRESSION: Normal size of the thyroid gland with one small left thyroid nodule.
Findings do not meet current SRU consensus criteria for biopsy.
Follow-up by clinical exam is recommended. If patient has known risk
factors for thyroid carcinoma, consider follow-up ultrasound in 12
months. If patient is clinically hyperthyroid, consider nuclear
medicine thyroid uptake and scan.Reference: Management of Thyroid
Nodules Detected at US: Society of Radiologists in Ultrasound

## 2016-01-22 ENCOUNTER — Other Ambulatory Visit: Payer: Medicare Other

## 2016-02-24 DIAGNOSIS — R109 Unspecified abdominal pain: Secondary | ICD-10-CM | POA: Diagnosis not present

## 2016-02-24 DIAGNOSIS — E1165 Type 2 diabetes mellitus with hyperglycemia: Secondary | ICD-10-CM | POA: Diagnosis not present

## 2016-03-10 DIAGNOSIS — E1165 Type 2 diabetes mellitus with hyperglycemia: Secondary | ICD-10-CM | POA: Diagnosis not present

## 2016-03-16 DIAGNOSIS — R109 Unspecified abdominal pain: Secondary | ICD-10-CM | POA: Diagnosis not present

## 2016-03-25 DIAGNOSIS — R1011 Right upper quadrant pain: Secondary | ICD-10-CM | POA: Diagnosis not present

## 2016-03-25 DIAGNOSIS — G894 Chronic pain syndrome: Secondary | ICD-10-CM | POA: Diagnosis not present

## 2016-03-25 DIAGNOSIS — M542 Cervicalgia: Secondary | ICD-10-CM | POA: Diagnosis not present

## 2016-04-07 DIAGNOSIS — E119 Type 2 diabetes mellitus without complications: Secondary | ICD-10-CM | POA: Diagnosis not present

## 2016-04-07 DIAGNOSIS — H2511 Age-related nuclear cataract, right eye: Secondary | ICD-10-CM | POA: Diagnosis not present

## 2016-04-07 DIAGNOSIS — H5315 Visual distortions of shape and size: Secondary | ICD-10-CM | POA: Diagnosis not present

## 2016-04-07 DIAGNOSIS — H35372 Puckering of macula, left eye: Secondary | ICD-10-CM | POA: Diagnosis not present

## 2016-05-27 DIAGNOSIS — M25519 Pain in unspecified shoulder: Secondary | ICD-10-CM | POA: Diagnosis not present

## 2016-05-27 DIAGNOSIS — M064 Inflammatory polyarthropathy: Secondary | ICD-10-CM | POA: Diagnosis not present

## 2016-05-27 DIAGNOSIS — M1990 Unspecified osteoarthritis, unspecified site: Secondary | ICD-10-CM | POA: Diagnosis not present

## 2016-05-27 DIAGNOSIS — M19041 Primary osteoarthritis, right hand: Secondary | ICD-10-CM | POA: Diagnosis not present

## 2016-05-27 DIAGNOSIS — M25511 Pain in right shoulder: Secondary | ICD-10-CM | POA: Diagnosis not present

## 2016-05-27 DIAGNOSIS — M19042 Primary osteoarthritis, left hand: Secondary | ICD-10-CM | POA: Diagnosis not present

## 2016-05-27 DIAGNOSIS — M542 Cervicalgia: Secondary | ICD-10-CM | POA: Diagnosis not present

## 2016-06-08 DIAGNOSIS — I10 Essential (primary) hypertension: Secondary | ICD-10-CM | POA: Diagnosis not present

## 2016-06-08 DIAGNOSIS — E1165 Type 2 diabetes mellitus with hyperglycemia: Secondary | ICD-10-CM | POA: Diagnosis not present

## 2016-06-10 DIAGNOSIS — I10 Essential (primary) hypertension: Secondary | ICD-10-CM | POA: Diagnosis not present

## 2016-06-10 DIAGNOSIS — E1165 Type 2 diabetes mellitus with hyperglycemia: Secondary | ICD-10-CM | POA: Diagnosis not present

## 2016-06-17 DIAGNOSIS — M069 Rheumatoid arthritis, unspecified: Secondary | ICD-10-CM | POA: Diagnosis not present

## 2016-06-17 DIAGNOSIS — M1990 Unspecified osteoarthritis, unspecified site: Secondary | ICD-10-CM | POA: Diagnosis not present

## 2016-06-17 DIAGNOSIS — R21 Rash and other nonspecific skin eruption: Secondary | ICD-10-CM | POA: Diagnosis not present

## 2016-06-17 DIAGNOSIS — M25519 Pain in unspecified shoulder: Secondary | ICD-10-CM | POA: Diagnosis not present

## 2016-07-21 ENCOUNTER — Other Ambulatory Visit: Payer: Self-pay | Admitting: Internal Medicine

## 2016-07-21 DIAGNOSIS — N644 Mastodynia: Secondary | ICD-10-CM

## 2016-07-23 ENCOUNTER — Ambulatory Visit
Admission: RE | Admit: 2016-07-23 | Discharge: 2016-07-23 | Disposition: A | Discharge status: Home / Self Care | Payer: Medicare Other | Source: Ambulatory Visit | Attending: Internal Medicine | Admitting: Internal Medicine

## 2016-07-23 DIAGNOSIS — N644 Mastodynia: Secondary | ICD-10-CM

## 2016-09-06 DIAGNOSIS — I10 Essential (primary) hypertension: Secondary | ICD-10-CM | POA: Diagnosis not present

## 2016-09-06 DIAGNOSIS — E1165 Type 2 diabetes mellitus with hyperglycemia: Secondary | ICD-10-CM | POA: Diagnosis not present

## 2016-09-08 ENCOUNTER — Other Ambulatory Visit: Payer: Self-pay | Admitting: Internal Medicine

## 2016-09-08 DIAGNOSIS — Z1231 Encounter for screening mammogram for malignant neoplasm of breast: Secondary | ICD-10-CM

## 2016-09-09 DIAGNOSIS — Z23 Encounter for immunization: Secondary | ICD-10-CM | POA: Diagnosis not present

## 2016-09-09 DIAGNOSIS — R1013 Epigastric pain: Secondary | ICD-10-CM | POA: Diagnosis not present

## 2016-09-10 ENCOUNTER — Other Ambulatory Visit: Payer: Self-pay | Admitting: Internal Medicine

## 2016-09-10 DIAGNOSIS — R1013 Epigastric pain: Secondary | ICD-10-CM

## 2016-09-14 DIAGNOSIS — K436 Other and unspecified ventral hernia with obstruction, without gangrene: Secondary | ICD-10-CM | POA: Diagnosis not present

## 2016-09-14 DIAGNOSIS — K760 Fatty (change of) liver, not elsewhere classified: Secondary | ICD-10-CM | POA: Diagnosis not present

## 2016-09-14 DIAGNOSIS — R1011 Right upper quadrant pain: Secondary | ICD-10-CM | POA: Diagnosis not present

## 2016-09-14 DIAGNOSIS — K219 Gastro-esophageal reflux disease without esophagitis: Secondary | ICD-10-CM | POA: Diagnosis not present

## 2016-09-14 DIAGNOSIS — K5730 Diverticulosis of large intestine without perforation or abscess without bleeding: Secondary | ICD-10-CM | POA: Diagnosis not present

## 2016-09-16 DIAGNOSIS — M1990 Unspecified osteoarthritis, unspecified site: Secondary | ICD-10-CM | POA: Diagnosis not present

## 2016-09-16 DIAGNOSIS — M064 Inflammatory polyarthropathy: Secondary | ICD-10-CM | POA: Diagnosis not present

## 2016-09-16 DIAGNOSIS — R21 Rash and other nonspecific skin eruption: Secondary | ICD-10-CM | POA: Diagnosis not present

## 2016-09-17 ENCOUNTER — Ambulatory Visit
Admission: RE | Admit: 2016-09-17 | Discharge: 2016-09-17 | Disposition: A | Discharge status: Home / Self Care | Payer: Medicare Other | Source: Ambulatory Visit | Attending: Internal Medicine | Admitting: Internal Medicine

## 2016-09-17 DIAGNOSIS — K760 Fatty (change of) liver, not elsewhere classified: Secondary | ICD-10-CM | POA: Diagnosis not present

## 2016-09-17 DIAGNOSIS — R1013 Epigastric pain: Secondary | ICD-10-CM

## 2016-09-17 DIAGNOSIS — K429 Umbilical hernia without obstruction or gangrene: Secondary | ICD-10-CM | POA: Diagnosis not present

## 2016-09-17 IMAGING — CT CT ABD-PELV W/ CM
2 of 5 series · 16 of 46 positions shown, 18 images · IV contrast (APPLIED)
Comparison: 08/22/2015.

CLINICAL DATA: Diffuse abdominal pain. Umbilical hernia. Previous
hernia repair, cholecystectomy and hysterectomy with bilateral
oophorectomy.

EXAM:
CT ABDOMEN AND PELVIS WITH CONTRAST
TECHNIQUE: Multidetector CT imaging of the abdomen and pelvis was performed
using the standard protocol following bolus administration of
intravenous contrast.
CONTRAST:  100mL ALPNTX-ZHH IOPAMIDOL (ALPNTX-ZHH) INJECTION 61%

[Series 2: abd/pelvis w/cm · axial · 0.78mm/px · z∈[+842,+1247]mm · 13 of 91 slices shown, 15 images]
[im 5/91  soft-tissue]
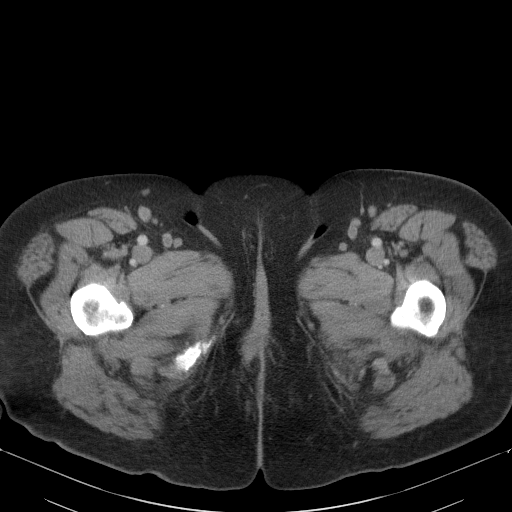
[im 5/91  bone]
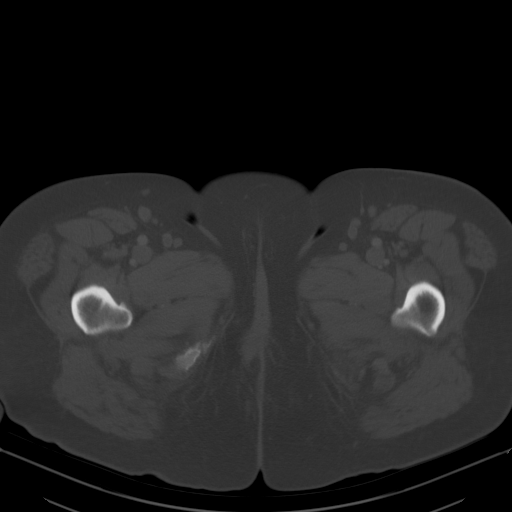
[im 15/91  soft-tissue]
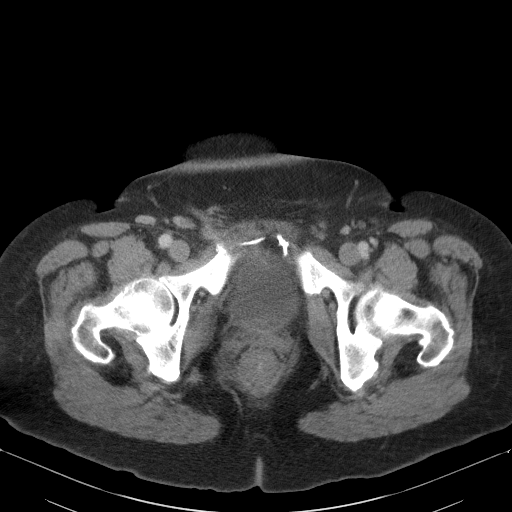
[im 19/91  soft-tissue]
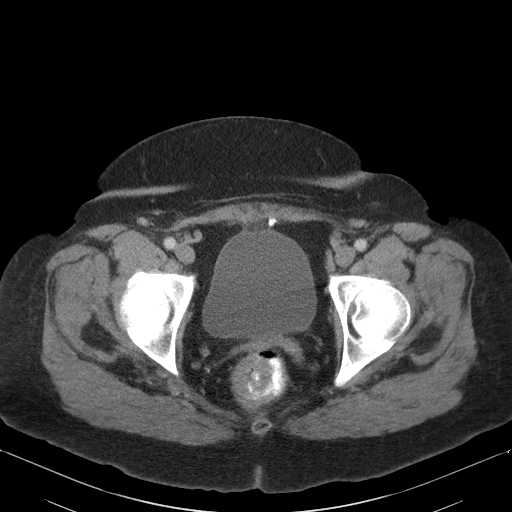
[im 24/91  soft-tissue]
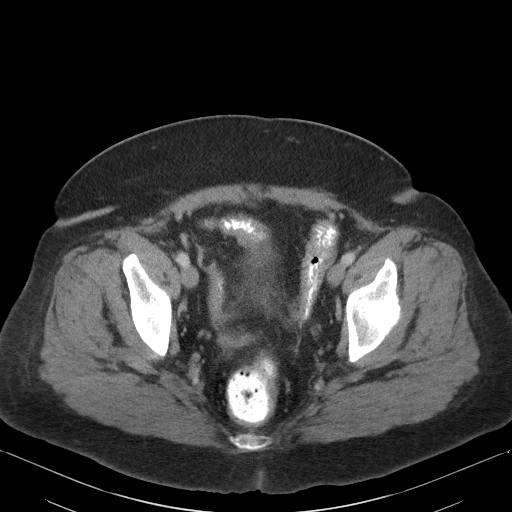
[im 34/91  soft-tissue]
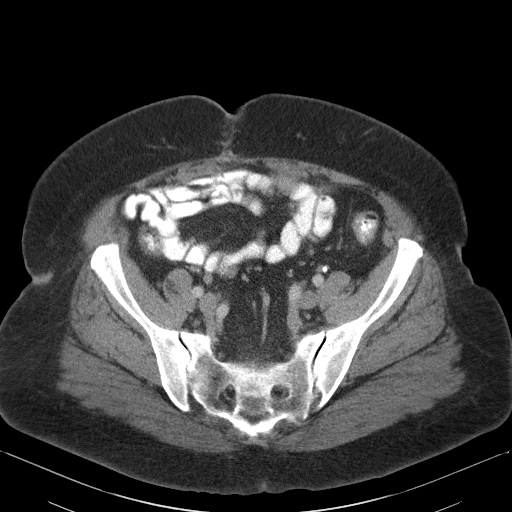
[im 38/91  soft-tissue]
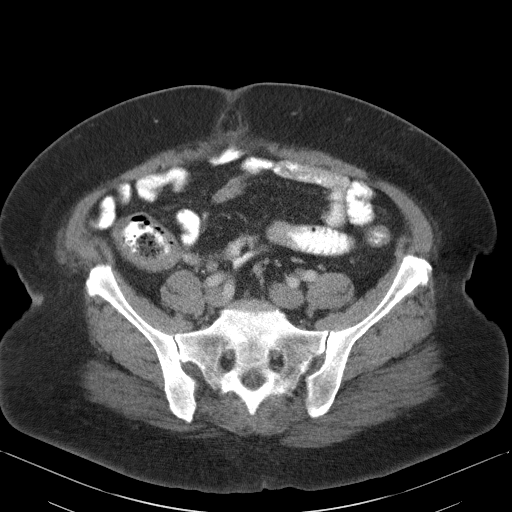
[im 48/91  soft-tissue]
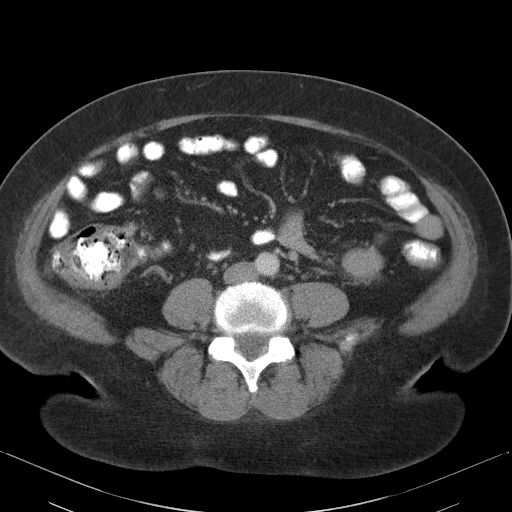
[im 53/91  soft-tissue]
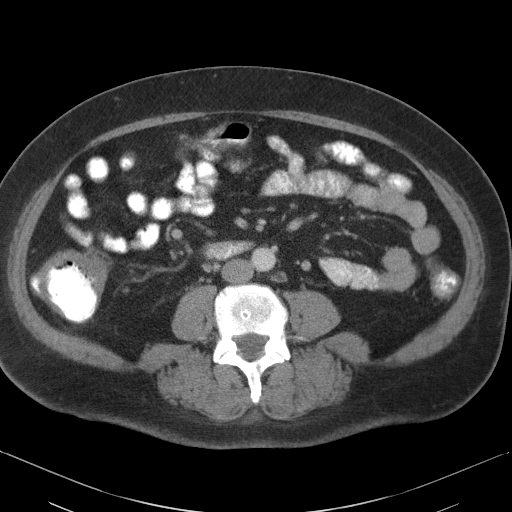
[im 57/91  soft-tissue]
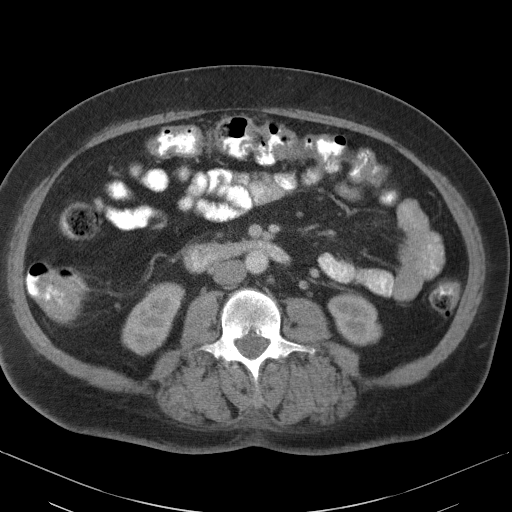
[im 57/91  bone]
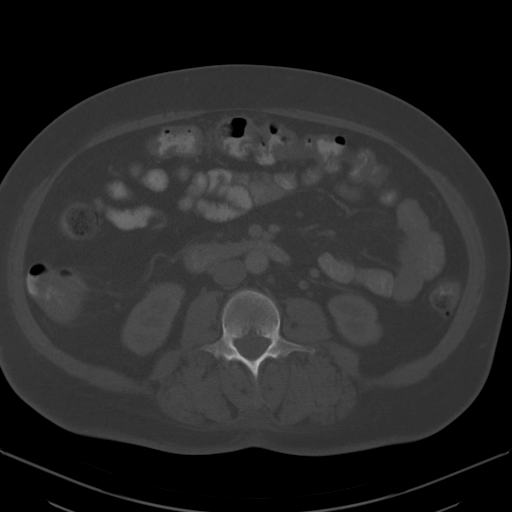
[im 67/91  soft-tissue]
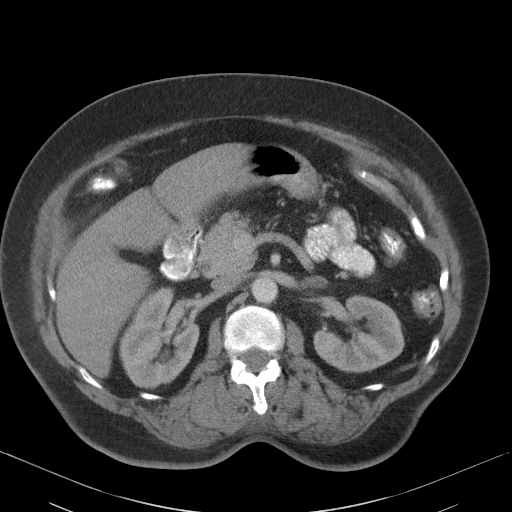
[im 72/91  soft-tissue]
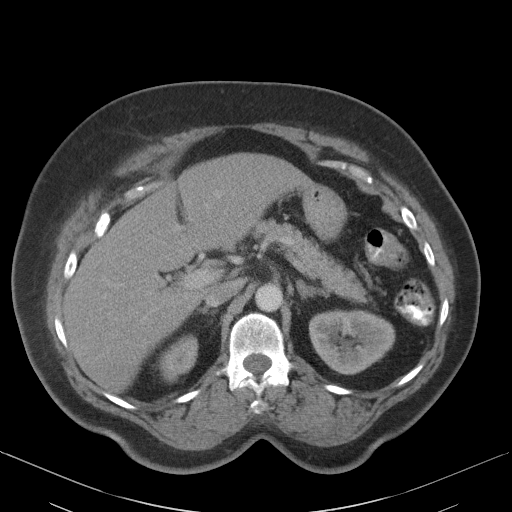
[im 76/91  soft-tissue]
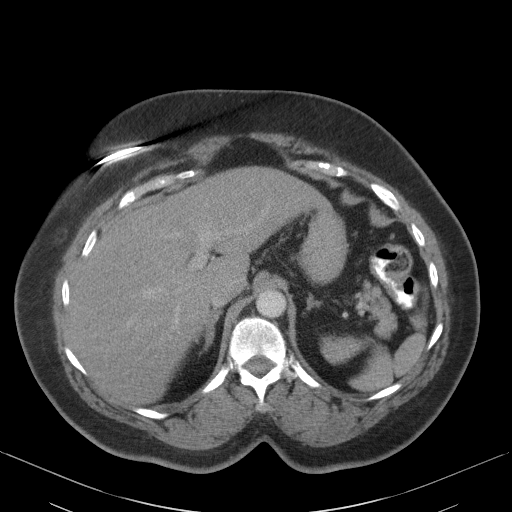
[im 86/91  soft-tissue]
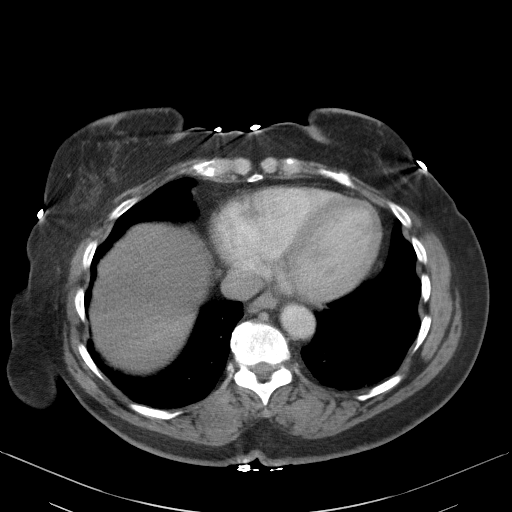

[Series 3: cor · coronal · 0.79mm/px · 3 of 112 slices shown]
[im 38/112  soft-tissue]
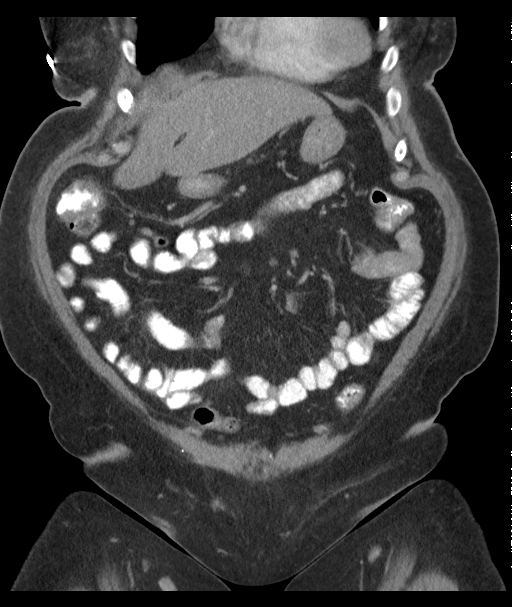
[im 50/112  soft-tissue]
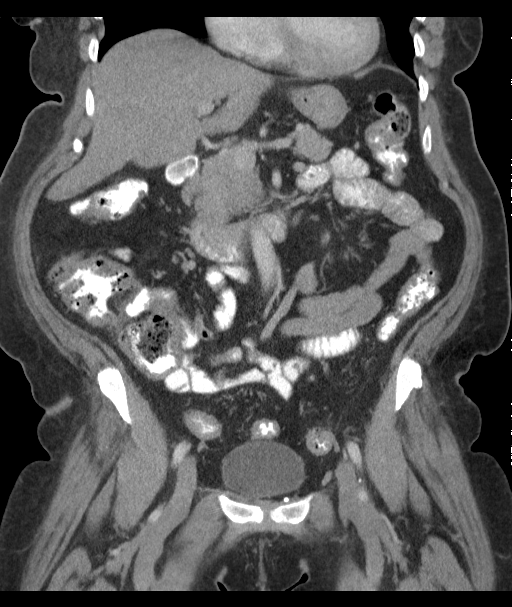
[im 62/112  soft-tissue]
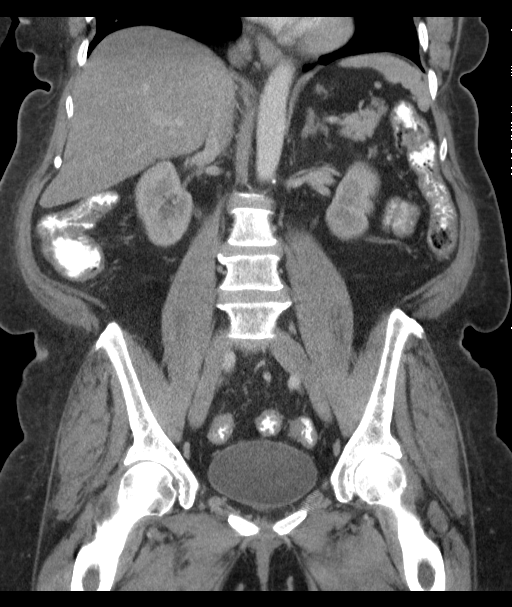

[16 of 46 positions shown; findings below may reference images not displayed]

FINDINGS: Lower chest: Minimal bilateral dependent atelectasis.

Hepatobiliary: Mild diffuse low density of the liver relative to the
spleen without significant change. Cholecystectomy clips.

Pancreas: Unremarkable. No pancreatic ductal dilatation or
surrounding inflammatory changes.

Spleen: Normal in size without focal abnormality.

Adrenals/Urinary Tract: Adrenal glands are unremarkable. Kidneys are
normal, without renal calculi, focal lesion, or hydronephrosis.
Bladder is unremarkable.

Stomach/Bowel: Stomach is within normal limits. Appendix appears
normal. No evidence of bowel wall thickening, distention, or
inflammatory changes.

Vascular/Lymphatic: Mild arterial calcifications, not involving the
abdominal aorta. No enlarged lymph nodes.

Reproductive: Surgically absent uterus and ovaries.

Other: Small umbilical hernia containing fat.

Musculoskeletal: Lumbar and lower thoracic spine degenerative
changes.
IMPRESSION: 1. No acute abnormality.
2. Stable mild diffuse hepatic steatosis.
3. Stable small umbilical hernia containing fat.

## 2016-09-17 MED ORDER — IOPAMIDOL (ISOVUE-300) INJECTION 61%
100.0000 mL | Freq: Once | INTRAVENOUS | Status: AC | PRN
  Administered 2016-09-17: 100 mL via INTRAVENOUS

## 2016-09-21 DIAGNOSIS — E1165 Type 2 diabetes mellitus with hyperglycemia: Secondary | ICD-10-CM | POA: Diagnosis not present

## 2016-09-21 DIAGNOSIS — I10 Essential (primary) hypertension: Secondary | ICD-10-CM | POA: Diagnosis not present

## 2016-09-21 DIAGNOSIS — Z780 Asymptomatic menopausal state: Secondary | ICD-10-CM | POA: Diagnosis not present

## 2016-09-21 DIAGNOSIS — K760 Fatty (change of) liver, not elsewhere classified: Secondary | ICD-10-CM | POA: Diagnosis not present

## 2016-10-01 DIAGNOSIS — E1165 Type 2 diabetes mellitus with hyperglycemia: Secondary | ICD-10-CM | POA: Diagnosis not present

## 2016-10-01 DIAGNOSIS — H6503 Acute serous otitis media, bilateral: Secondary | ICD-10-CM | POA: Diagnosis not present

## 2016-10-07 ENCOUNTER — Ambulatory Visit
Admission: RE | Admit: 2016-10-07 | Discharge: 2016-10-07 | Disposition: A | Discharge status: Home / Self Care | Payer: Medicare Other | Source: Ambulatory Visit | Attending: Internal Medicine | Admitting: Internal Medicine

## 2016-10-07 DIAGNOSIS — Z1231 Encounter for screening mammogram for malignant neoplasm of breast: Secondary | ICD-10-CM

## 2016-10-11 DIAGNOSIS — H2513 Age-related nuclear cataract, bilateral: Secondary | ICD-10-CM | POA: Diagnosis not present

## 2016-10-11 DIAGNOSIS — H35372 Puckering of macula, left eye: Secondary | ICD-10-CM | POA: Diagnosis not present

## 2016-10-11 DIAGNOSIS — E119 Type 2 diabetes mellitus without complications: Secondary | ICD-10-CM | POA: Diagnosis not present

## 2016-10-11 DIAGNOSIS — H43813 Vitreous degeneration, bilateral: Secondary | ICD-10-CM | POA: Diagnosis not present

## 2016-10-14 DIAGNOSIS — M12279 Villonodular synovitis (pigmented), unspecified ankle and foot: Secondary | ICD-10-CM | POA: Diagnosis not present

## 2016-10-14 DIAGNOSIS — M792 Neuralgia and neuritis, unspecified: Secondary | ICD-10-CM | POA: Diagnosis not present

## 2016-10-14 DIAGNOSIS — L600 Ingrowing nail: Secondary | ICD-10-CM | POA: Diagnosis not present

## 2016-10-14 DIAGNOSIS — L03032 Cellulitis of left toe: Secondary | ICD-10-CM | POA: Diagnosis not present

## 2016-11-04 DIAGNOSIS — M792 Neuralgia and neuritis, unspecified: Secondary | ICD-10-CM | POA: Diagnosis not present

## 2016-12-02 DIAGNOSIS — G5601 Carpal tunnel syndrome, right upper limb: Secondary | ICD-10-CM | POA: Diagnosis not present

## 2016-12-02 DIAGNOSIS — M542 Cervicalgia: Secondary | ICD-10-CM | POA: Diagnosis not present

## 2016-12-02 DIAGNOSIS — G609 Hereditary and idiopathic neuropathy, unspecified: Secondary | ICD-10-CM | POA: Diagnosis not present

## 2016-12-02 DIAGNOSIS — R634 Abnormal weight loss: Secondary | ICD-10-CM | POA: Diagnosis not present

## 2016-12-02 DIAGNOSIS — G603 Idiopathic progressive neuropathy: Secondary | ICD-10-CM | POA: Diagnosis not present

## 2016-12-02 DIAGNOSIS — R202 Paresthesia of skin: Secondary | ICD-10-CM | POA: Diagnosis not present

## 2016-12-02 DIAGNOSIS — M5417 Radiculopathy, lumbosacral region: Secondary | ICD-10-CM | POA: Diagnosis not present

## 2016-12-02 DIAGNOSIS — M5412 Radiculopathy, cervical region: Secondary | ICD-10-CM | POA: Diagnosis not present

## 2016-12-16 DIAGNOSIS — R21 Rash and other nonspecific skin eruption: Secondary | ICD-10-CM | POA: Diagnosis not present

## 2016-12-16 DIAGNOSIS — M064 Inflammatory polyarthropathy: Secondary | ICD-10-CM | POA: Diagnosis not present

## 2016-12-16 DIAGNOSIS — M25511 Pain in right shoulder: Secondary | ICD-10-CM | POA: Diagnosis not present

## 2016-12-16 DIAGNOSIS — M1990 Unspecified osteoarthritis, unspecified site: Secondary | ICD-10-CM | POA: Diagnosis not present

## 2016-12-17 ENCOUNTER — Other Ambulatory Visit: Payer: Self-pay | Admitting: Rheumatology

## 2016-12-17 DIAGNOSIS — M25511 Pain in right shoulder: Secondary | ICD-10-CM

## 2016-12-27 ENCOUNTER — Ambulatory Visit
Admission: RE | Admit: 2016-12-27 | Discharge: 2016-12-27 | Disposition: A | Discharge status: Home / Self Care | Payer: Medicare Other | Source: Ambulatory Visit | Attending: Rheumatology | Admitting: Rheumatology

## 2016-12-27 DIAGNOSIS — M25511 Pain in right shoulder: Secondary | ICD-10-CM

## 2016-12-27 IMAGING — MR MR SHOULDER*R* W/O CM
4 of 5 series · 14 of 40 positions shown · non-contrast
Comparison: None.

CLINICAL DATA: Right shoulder pain for 1 year.  No known injury.

EXAM:
MRI OF THE RIGHT SHOULDER WITHOUT CONTRAST
TECHNIQUE: Multiplanar, multisequence MR imaging of the shoulder was performed.
No intravenous contrast was administered.

[Series 6: T2 fat-sat · axial · left · 3.0mm · 0.44mm/px · z∈[-22,+48]mm · 3 of 25 slices shown (1 of 3)]
[im 3/25]
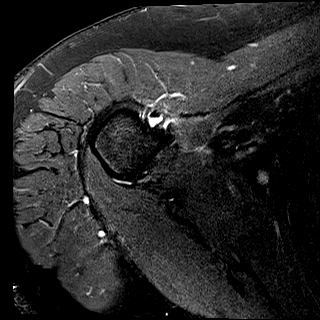
[im 14/25]
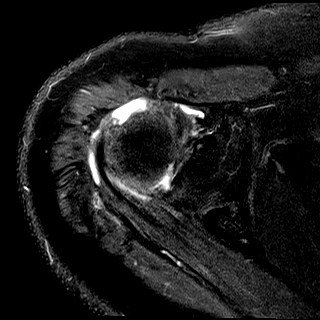
[im 22/25]
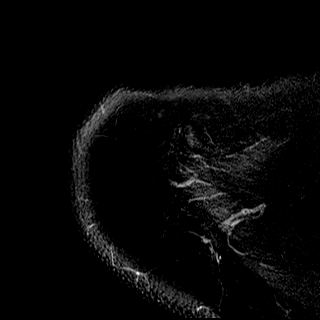

[Series 7: T2 fat-sat · oblique · left · 3.0mm · 0.44mm/px · 3 of 21 slices shown (2 of 3)]
[im 4/21]
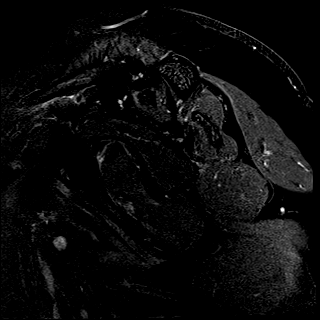
[im 11/21]
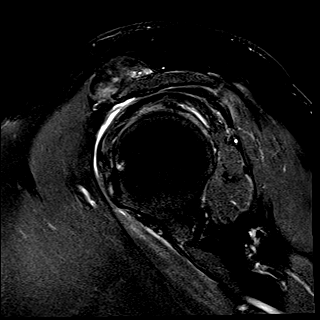
[im 17/21]
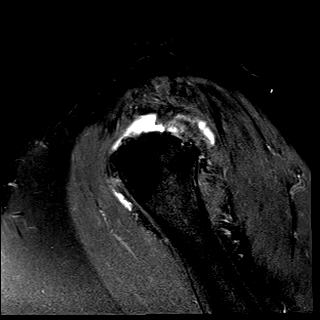

[Series 9: PD · oblique · left · 3.0mm · 0.18mm/px · 5 of 24 slices shown]
[im 1/24]
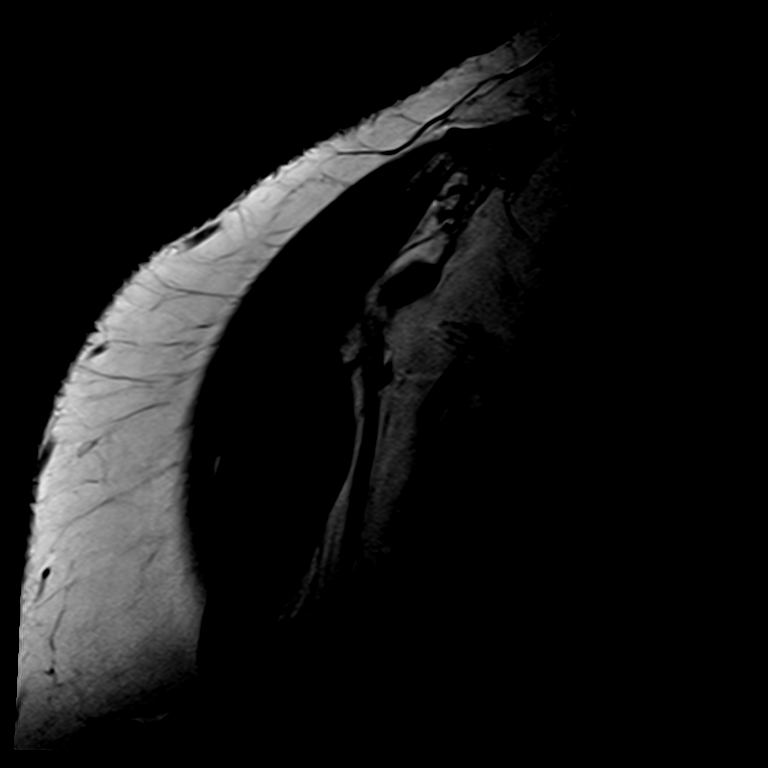
[im 4/24]
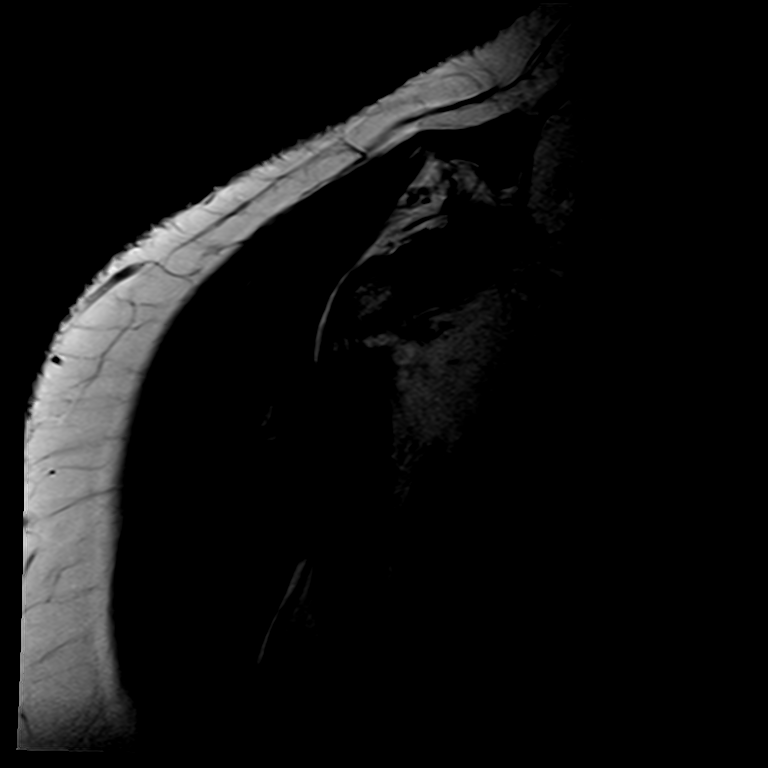
[im 7/24]
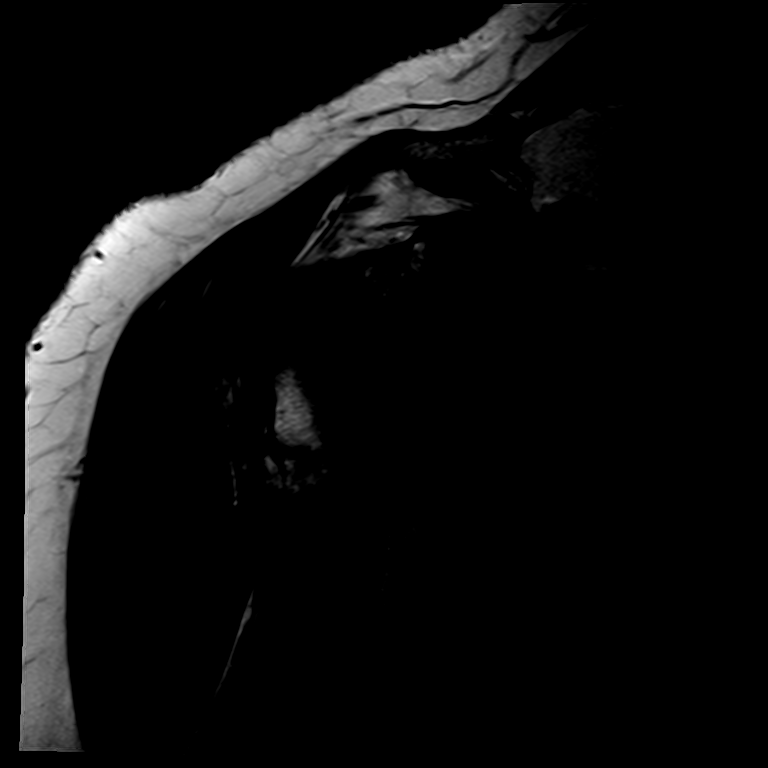
[im 14/24]
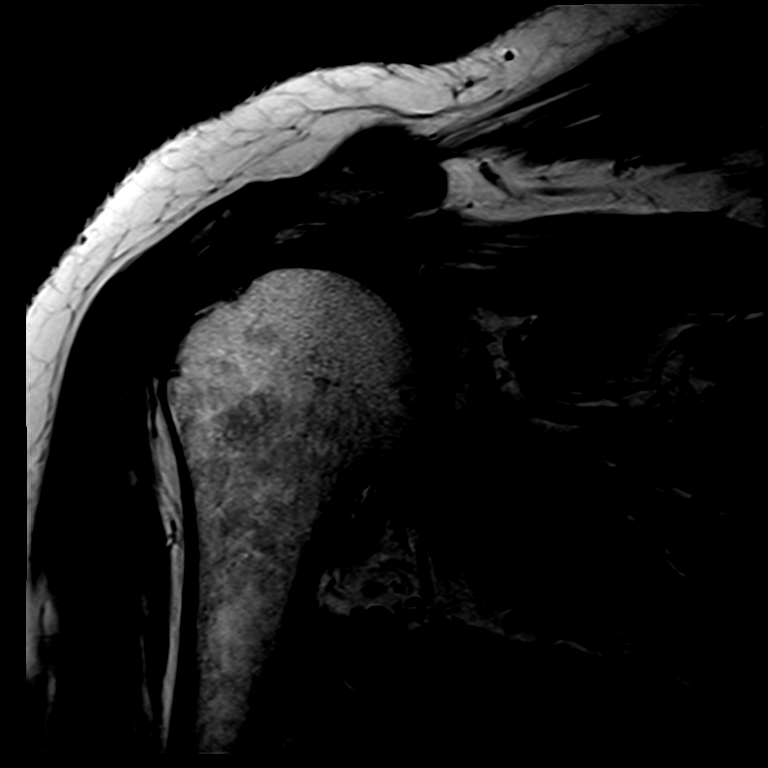
[im 20/24]
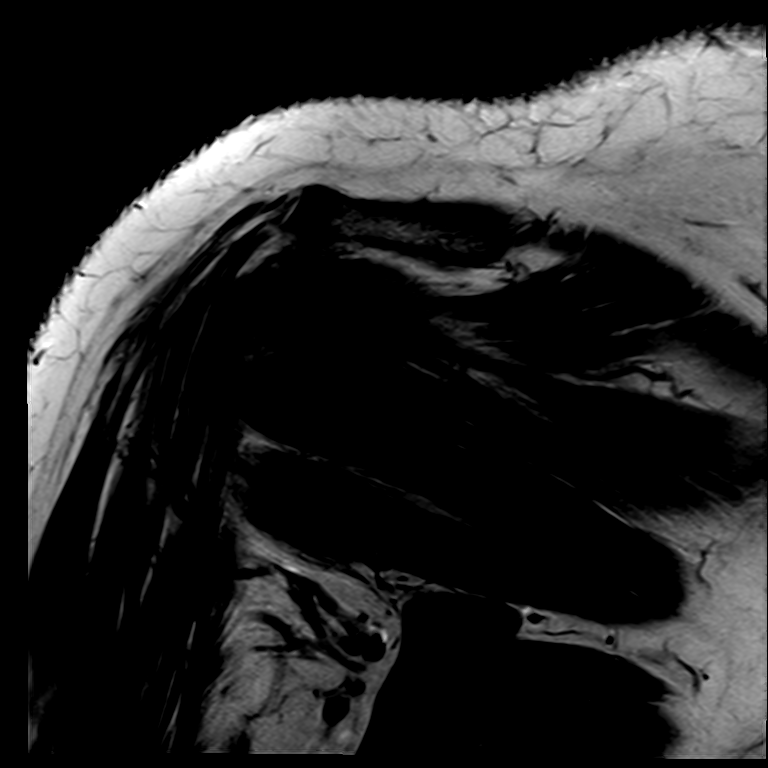

[Series 10: T2 fat-sat · oblique · left · 3.0mm · 0.22mm/px · 3 of 24 slices shown (3 of 3)]
[im 4/24]
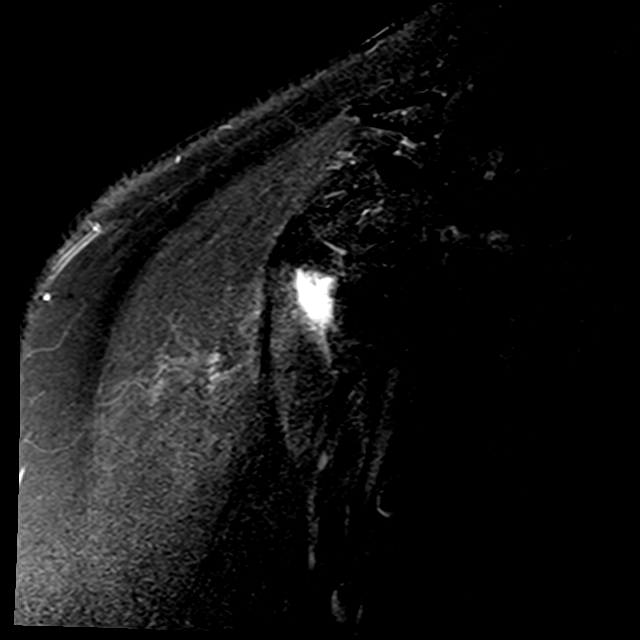
[im 14/24]
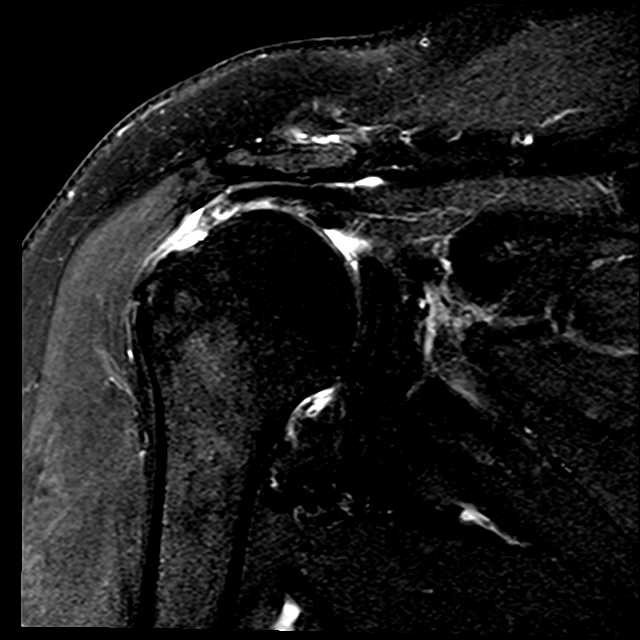
[im 20/24]
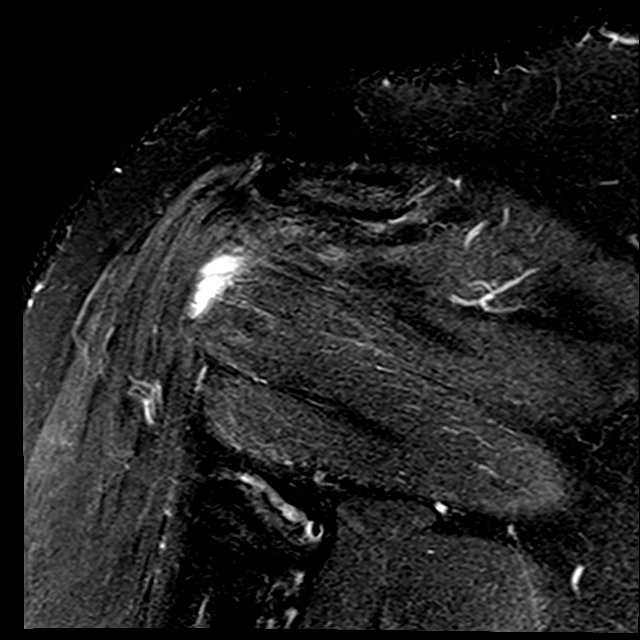

[14 of 40 positions shown; findings below may reference images not displayed]

FINDINGS: Rotator cuff: Complete tear of the supraspinatus tendon with 2 cm of
retraction. Moderate tendinosis of the infraspinatus tendon. Teres
minor tendon is intact. Moderate tendinosis of the subscapularis
tendon with a partial-thickness tear.

Muscles: Mild muscle atrophy of the supraspinatus and infraspinatus
muscles.

Biceps long head: Mild tendinosis of the intraarticular portion of
the long head of the biceps tendon.

Acromioclavicular Joint: Moderate arthropathy of the
acromioclavicular joint. Type I acromion. Small amount of
subacromial/subdeltoid bursal fluid.

Glenohumeral Joint: Mild partial-thickness cartilage loss of the
humeral head. Mild partial-thickness cartilage loss of the superior
glenoid.

Labrum: Grossly intact, but evaluation is limited by lack of
intraarticular fluid.

Bones: No focal marrow signal abnormality. No fracture or
dislocation.

Other: No fluid collection or hematoma.
IMPRESSION: 1. Complete tear of the supraspinatus tendon with 2 cm of
retraction.
2. Moderate tendinosis of the infraspinatus tendon.
3. Moderate tendinosis of the subscapularis tendon with a
partial-thickness tear.
4. Mild tendinosis of the intraarticular portion of the long head of
the biceps tendon.

## 2016-12-29 DIAGNOSIS — S43431A Superior glenoid labrum lesion of right shoulder, initial encounter: Secondary | ICD-10-CM | POA: Diagnosis not present

## 2016-12-29 DIAGNOSIS — M19011 Primary osteoarthritis, right shoulder: Secondary | ICD-10-CM | POA: Diagnosis not present

## 2016-12-29 DIAGNOSIS — S46011D Strain of muscle(s) and tendon(s) of the rotator cuff of right shoulder, subsequent encounter: Secondary | ICD-10-CM | POA: Diagnosis not present

## 2017-02-11 ENCOUNTER — Other Ambulatory Visit: Payer: Self-pay | Admitting: Orthopedic Surgery

## 2017-02-17 NOTE — H&P (Signed)
  Diane [name] is an 71 y.o. female.    Chief Complaint: right shoulder pain  HPI: Pt is a 71 y.o. female complaining of right shoulder pain for multiple years. Pain had continually increased since the beginning. X-rays in the clinic show cuff tear and AC arthrosis right shoulder. Pt has tried various conservative treatments which have failed to alleviate their symptoms, including injections and therapy. Various options are discussed with the patient. Risks, benefits and expectations were discussed with the patient. Patient understand the risks, benefits and expectations and wishes to proceed with surgery.   PCP:  Luna Fuse., MD  D/C Plans: Home  PMH: Past Medical History:  Diagnosis Date  . Diabetes mellitus   . Hypertension     PSH: Past Surgical History:  Procedure Laterality Date  . ABDOMINAL HYSTERECTOMY    . CHOLECYSTECTOMY    . HERNIA REPAIR      Social History:  reports that she has never smoked. She does not have any smokeless tobacco history on file. She reports that she does not drink alcohol or use drugs.  Allergies:  Allergies  Allergen Reactions  . Cephalexin Other (See Comments)    "blocked my urine"  . Codeine Rash    Medications: No current facility-administered medications for this encounter.    Current Outpatient Prescriptions  Medication Sig Dispense Refill  . aspirin 81 MG chewable tablet Chew 81 mg by mouth daily.    Marland Kitchen doxycycline (VIBRA-TABS) 100 MG tablet Take 1 tablet (100 mg total) by mouth 2 (two) times daily. 20 tablet 0  . furosemide (LASIX) 20 MG tablet Take 20 mg by mouth 2 (two) times a week.    . gabapentin (NEURONTIN) 300 MG capsule Take 300 mg by mouth 3 (three) times daily.    . metFORMIN (GLUCOPHAGE) 500 MG tablet Take 500 mg by mouth daily with breakfast.     . omeprazole (PRILOSEC) 20 MG capsule Take 20 mg by mouth daily.    . sucralfate (CARAFATE) 1 G tablet Take one tablet qid ac and hs for stomach 90 tablet 0  .  Telmisartan-Amlodipine (TWYNSTA) 80-5 MG TABS Take 1 tablet by mouth daily.    . traMADol (ULTRAM) 50 MG tablet Take 1 tablet (50 mg total) by mouth every 8 (eight) hours as needed. 30 tablet 0    No results found for this or any previous visit (from the past 48 hour(s)). No results found.  ROS: Pain with rom of the right upper extremity  Physical Exam:  Alert and oriented 71 y.o. female in no acute distress Cranial nerves 2-12 intact Cervical spine: full rom with no tenderness, nv intact distally Chest: active breath sounds bilaterally, no wheeze rhonchi or rales Heart: regular rate and rhythm, no murmur Abd: non tender non distended with active bowel sounds Hip is stable with rom  Right shoulder with painful rom nv intact distally Mild weakness with ER testing No rashes or edema  Assessment/Plan Assessment: right shoulder cuff tear   Plan: Patient will undergo a right shoulder scope and cuff repair by Dr. Veverly Fells at [facility]. Risks benefits and expectations were discussed with the patient. Patient understand risks, benefits and expectations and wishes to proceed.

## 2017-03-02 ENCOUNTER — Encounter (HOSPITAL_COMMUNITY)
Admission: RE | Admit: 2017-03-02 | Discharge: 2017-03-02 | Disposition: A | Discharge status: Home / Self Care | Payer: Medicare Other | Source: Ambulatory Visit | Attending: Orthopedic Surgery | Admitting: Orthopedic Surgery

## 2017-03-02 ENCOUNTER — Encounter (HOSPITAL_COMMUNITY): Payer: Self-pay

## 2017-03-02 DIAGNOSIS — R011 Cardiac murmur, unspecified: Secondary | ICD-10-CM | POA: Diagnosis not present

## 2017-03-02 DIAGNOSIS — E119 Type 2 diabetes mellitus without complications: Secondary | ICD-10-CM | POA: Insufficient documentation

## 2017-03-02 DIAGNOSIS — Z01810 Encounter for preprocedural cardiovascular examination: Secondary | ICD-10-CM | POA: Diagnosis not present

## 2017-03-02 DIAGNOSIS — K219 Gastro-esophageal reflux disease without esophagitis: Secondary | ICD-10-CM | POA: Insufficient documentation

## 2017-03-02 DIAGNOSIS — M1990 Unspecified osteoarthritis, unspecified site: Secondary | ICD-10-CM | POA: Insufficient documentation

## 2017-03-02 DIAGNOSIS — I10 Essential (primary) hypertension: Secondary | ICD-10-CM | POA: Insufficient documentation

## 2017-03-02 DIAGNOSIS — Z01812 Encounter for preprocedural laboratory examination: Secondary | ICD-10-CM | POA: Insufficient documentation

## 2017-03-02 HISTORY — DX: Cardiac murmur, unspecified: R01.1

## 2017-03-02 HISTORY — DX: Unspecified osteoarthritis, unspecified site: M19.90

## 2017-03-02 HISTORY — DX: Gastro-esophageal reflux disease without esophagitis: K21.9

## 2017-03-02 LAB — CBC
HCT: 39.9 % (ref 36.0–46.0)
Hemoglobin: 13.1 g/dL (ref 12.0–15.0)
MCH: 29.6 pg (ref 26.0–34.0)
MCHC: 32.8 g/dL (ref 30.0–36.0)
MCV: 90.1 fL (ref 78.0–100.0)
Platelets: 242 10*3/uL (ref 150–400)
RBC: 4.43 MIL/uL (ref 3.87–5.11)
RDW: 15 % (ref 11.5–15.5)
WBC: 6.3 10*3/uL (ref 4.0–10.5)

## 2017-03-02 LAB — BASIC METABOLIC PANEL
ANION GAP: 8 (ref 5–15)
BUN: 12 mg/dL (ref 6–20)
CO2: 26 mmol/L (ref 22–32)
Calcium: 9 mg/dL (ref 8.9–10.3)
Chloride: 105 mmol/L (ref 101–111)
Creatinine, Ser: 0.61 mg/dL (ref 0.44–1.00)
Glucose, Bld: 92 mg/dL (ref 65–99)
POTASSIUM: 3.6 mmol/L (ref 3.5–5.1)
SODIUM: 139 mmol/L (ref 135–145)

## 2017-03-02 LAB — GLUCOSE, CAPILLARY: GLUCOSE-CAPILLARY: 110 mg/dL — AB (ref 65–99)

## 2017-03-02 NOTE — Pre-Procedure Instructions (Addendum)
    Da Michelle Barna  03/02/2017      [facility] [place], [address] [place] [address] Phone: [phone] Fax: [phone]    Your procedure is scheduled on May 11th, Friday   Report to [facility] Admitting at 8:30 AM             (posted surgery time 10:35am - 1:05pm)   Call this number if you have problems the [facility] of surgery:  [phone], otherwise call [phone] Mon - Fri  From 8-4:30pm   Remember:  Do not eat food or drink liquids after midnight Thursday.   Take these medicines the morning of surgery with A SIP OF WATER : Gabapentin, Twynsta.               DO NOT take any diabetes medication the morning of your surgery.              4-5 days prior to surgery, STOP taking any Vitamins, Herbal Supplements, Anti-inflammatories.   Do not wear jewelry, make-up or nail polish.  Do not wear lotions, powders,  perfumes, or deoderant.  Do not shave 48 hours prior to surgery.     Do not bring valuables to the hospital.  [facility] is not responsible for any belongings or valuables.  Contacts, dentures or bridgework may not be worn into surgery.  Leave your suitcase in the car.  After surgery it may be brought to your room.  For patients admitted to the hospital, discharge time will be determined by your treatment team.  Please read over the following fact sheets that you were given. Pain Booklet, MRSA Information and Surgical Site Infection Prevention,

## 2017-03-02 NOTE — Progress Notes (Signed)
   03/02/17 1454  OBSTRUCTIVE SLEEP APNEA  Have you ever been diagnosed with sleep apnea through a sleep study? No  Do you snore loudly (loud enough to be heard through closed doors)?  0  Do you often feel tired, fatigued, or sleepy during the daytime (such as falling asleep during driving or talking to someone)? 0  Has anyone observed you stop breathing during your sleep? 0  Do you have, or are you being treated for high blood pressure? 1  BMI more than 35 kg/m2? 1  Age > 50 (1-yes) 1  Neck circumference greater than:Female 16 inches or larger, Female 17inches or larger? 1  Female Gender (Yes=1) 0  Obstructive Sleep Apnea Score 4  Score 5 or greater  Results sent to PCP

## 2017-03-02 NOTE — Progress Notes (Signed)
Patient states that back in 2013, in [place] she was having irregular heart beat with some chest discomfort.  Cath or some kind of heart test was done, and results were negative. I have requested ?? Info from a [place] in [place]. (time frame 2013-2015 ) She had said she was told she had a murmur "yrs ago", denies any other cardiac related issues or problems currently. She did go see a foot MD ("black toenail" after nail cut out) and toenail was numb.  Foot MD sent her to neurologist @ [facility] in [place] thought she had a pinched nerve. she hasn't gone back to because "he didn't give me any answers" PCP is Dr. Maudie Mercury, [place] 11/2016, but she did see the PA a few months ago for irregular heartbeats--? Does NOT check her sugars at all, "just take the pills" but last A1c was 7.0 in 10/2013 (repeated today) Currently denies any other cardiac tests.  Sees no cardiologist.

## 2017-03-03 ENCOUNTER — Inpatient Hospital Stay (HOSPITAL_COMMUNITY): Admission: RE | Admit: 2017-03-03 | Payer: Medicare Other | Source: Ambulatory Visit

## 2017-03-03 LAB — HEMOGLOBIN A1C
HEMOGLOBIN A1C: 6.2 % — AB (ref 4.8–5.6)
MEAN PLASMA GLUCOSE: 131 mg/dL

## 2017-03-04 NOTE — Progress Notes (Signed)
Anesthesia chart review: Patient is a 71 year old female scheduled for right shoulder arthroscopy, subacromial decompression, open biceps tenodesis, open rotator cuff repair, open distal clavicle excision on 03/11/2017 by Dr. Veverly Fells.  History includes never smoker, hypertension, murmur, diabetes mellitus type 2, burns (cooking oil), GERD, hysterectomy, cholecystectomy '10, breast biopsy, hernia repair. BMI is consistent with obesity. ED visits and/or hospitalizations for chest pain 08/2013, 06/2015, and 10/2015. No history of MI. Mildly abnormal stress test 2014 (see below). Normal EF and wall motion by 06/2014 echo.    PCP is Dr. Jani Gravel at [facility]. He cleared patient for surgery. Last office visit received was from 09/09/16. Patient denied any current cardiac issues at PAT.   Meds include  aspirin 81 mg, Neurontin, Claritin, metformin, telmisartan-amlodipine.  BP (!) 148/75   Pulse 85   Temp 36.8 C   Resp 20   Ht 5\' 8"  (1.727 m)   Wt 233 lb 6.4 oz (105.9 kg)   SpO2 97%   BMI 35.49 kg/m   EKG 03/02/17: NSR.  Echo 06/16/14 (done during chest pain admission; negative troponin X 3; relieved with IV Toradol): Study Conclusions - Left ventricle: The cavity size was normal. Systolic function was normal. The estimated ejection fraction was in the range of 55% to 60%. Wall motion was normal; there were no regional wall motion abnormalities. Left ventricular diastolic function parameters were normal.  Nuclear stress test 08/08/13 ([place], [place]): Impressions: Mildly abnormal study Exercise capacity 8 METS, average for age and gender. No chest pain. Appropriate heart rate and blood pressure response. Heart rhythm showed normal sinus rhythm with rare VPDs. No EKG abnormalities noted. Myocardial perfusion SPECT results are borderline and 91% of MPHR. Review of raw data shows breast attenuation artifact and diaphragmatic artifact. There is a  small, mild resting defect seen in the distal anterior wall at rest and gets better with stress and prone imaging and exhibits normal wall motion that likely represents breast attenuation. There is a small, mild somewhat reversible defect in the basal inferior wall that may represent a small area of ischemia although diaphragmatic attenuation artifact cannot be ruled out. Gated wall motion analysis showed normal wall motion with post stress LVEF 64% and post stress LVEDV of 12 mL. Normal LV ejection fraction.  Staff at [facility] [phone]) stated they had no record that patient ever had a cardiac cath.   Preoperative labs noted. CBC and BMET within normal limits. A1c 6.2.  Discussed with anesthesiologist Dr. Marcie Bal. Stress test > 3 years ago. Unremarkable echo 2015. EKG normal. DM well controlled. Patient has medical clearance for surgery. If she remains asymptomatic from a CV standpoint and otherwise no acute changes then it is anticipated that she can proceed as planned.  [facility] Short Stay Center/Anesthesiology Phone [phone] 03/04/2017 4:54 PM

## 2017-03-10 MED ORDER — CLINDAMYCIN PHOSPHATE 900 MG/50ML IV SOLN
900.0000 mg | INTRAVENOUS | Status: AC
  Administered 2017-03-11: 900 mg via INTRAVENOUS
  Filled 2017-03-10 (×2): qty 50

## 2017-03-11 ENCOUNTER — Ambulatory Visit (HOSPITAL_COMMUNITY)
Admission: RE | Admit: 2017-03-11 | Discharge: 2017-03-11 | Disposition: A | Discharge status: Home / Self Care | Payer: Medicare Other | Source: Ambulatory Visit | Attending: Orthopedic Surgery | Admitting: Orthopedic Surgery

## 2017-03-11 ENCOUNTER — Ambulatory Visit (HOSPITAL_COMMUNITY): Payer: Medicare Other | Admitting: Certified Registered"

## 2017-03-11 ENCOUNTER — Encounter (HOSPITAL_COMMUNITY): Payer: Self-pay | Admitting: Anesthesiology

## 2017-03-11 ENCOUNTER — Encounter (HOSPITAL_COMMUNITY)
Admission: RE | Disposition: A | Discharge status: Home / Self Care | Payer: Self-pay | Source: Ambulatory Visit | Attending: Orthopedic Surgery

## 2017-03-11 ENCOUNTER — Ambulatory Visit (HOSPITAL_COMMUNITY): Payer: Medicare Other | Admitting: Vascular Surgery

## 2017-03-11 DIAGNOSIS — E119 Type 2 diabetes mellitus without complications: Secondary | ICD-10-CM | POA: Diagnosis not present

## 2017-03-11 DIAGNOSIS — G8918 Other acute postprocedural pain: Secondary | ICD-10-CM | POA: Diagnosis not present

## 2017-03-11 DIAGNOSIS — I10 Essential (primary) hypertension: Secondary | ICD-10-CM | POA: Insufficient documentation

## 2017-03-11 DIAGNOSIS — Z7982 Long term (current) use of aspirin: Secondary | ICD-10-CM | POA: Diagnosis not present

## 2017-03-11 DIAGNOSIS — Z79899 Other long term (current) drug therapy: Secondary | ICD-10-CM | POA: Diagnosis not present

## 2017-03-11 DIAGNOSIS — S43431A Superior glenoid labrum lesion of right shoulder, initial encounter: Secondary | ICD-10-CM | POA: Diagnosis not present

## 2017-03-11 DIAGNOSIS — Z7984 Long term (current) use of oral hypoglycemic drugs: Secondary | ICD-10-CM | POA: Insufficient documentation

## 2017-03-11 DIAGNOSIS — M25511 Pain in right shoulder: Secondary | ICD-10-CM | POA: Diagnosis present

## 2017-03-11 DIAGNOSIS — M75121 Complete rotator cuff tear or rupture of right shoulder, not specified as traumatic: Secondary | ICD-10-CM | POA: Diagnosis not present

## 2017-03-11 DIAGNOSIS — M19011 Primary osteoarthritis, right shoulder: Secondary | ICD-10-CM | POA: Diagnosis not present

## 2017-03-11 DIAGNOSIS — X58XXXA Exposure to other specified factors, initial encounter: Secondary | ICD-10-CM | POA: Insufficient documentation

## 2017-03-11 DIAGNOSIS — M75101 Unspecified rotator cuff tear or rupture of right shoulder, not specified as traumatic: Secondary | ICD-10-CM | POA: Diagnosis not present

## 2017-03-11 HISTORY — PX: SHOULDER ARTHROSCOPY WITH SUBACROMIAL DECOMPRESSION AND OPEN ROTATOR C: SHX5688

## 2017-03-11 LAB — GLUCOSE, CAPILLARY
GLUCOSE-CAPILLARY: 123 mg/dL — AB (ref 65–99)
Glucose-Capillary: 134 mg/dL — ABNORMAL HIGH (ref 65–99)

## 2017-03-11 SURGERY — SHOULDER ARTHROSCOPY WITH SUBACROMIAL DECOMPRESSION AND OPEN ROTATOR CUFF REPAIR, OPEN BICEPS TENDON REPAIR
Anesthesia: General | Site: Shoulder | Laterality: Right

## 2017-03-11 MED ORDER — OXYCODONE-ACETAMINOPHEN 5-325 MG PO TABS: 1.0000 | ORAL_TABLET | ORAL | 0 refills | Status: DC | PRN

## 2017-03-11 MED ORDER — MIDAZOLAM HCL 2 MG/2ML IJ SOLN
INTRAMUSCULAR | Status: AC
  Filled 2017-03-11: qty 2

## 2017-03-11 MED ORDER — PHENYLEPHRINE HCL 10 MG/ML IJ SOLN
0.0000 ug/min | INTRAMUSCULAR | Status: DC
  Filled 2017-03-11: qty 1

## 2017-03-11 MED ORDER — FENTANYL CITRATE (PF) 100 MCG/2ML IJ SOLN
INTRAMUSCULAR | Status: AC
  Administered 2017-03-11: 50 ug
  Filled 2017-03-11: qty 2

## 2017-03-11 MED ORDER — LACTATED RINGERS IV SOLN
INTRAVENOUS | Status: DC
  Administered 2017-03-11 (×2): via INTRAVENOUS

## 2017-03-11 MED ORDER — ONDANSETRON HCL 4 MG/2ML IJ SOLN
INTRAMUSCULAR | Status: DC | PRN
  Administered 2017-03-11: 4 mg via INTRAVENOUS

## 2017-03-11 MED ORDER — SUCCINYLCHOLINE CHLORIDE 200 MG/10ML IV SOSY
PREFILLED_SYRINGE | INTRAVENOUS | Status: AC
  Filled 2017-03-11: qty 10

## 2017-03-11 MED ORDER — EPHEDRINE SULFATE 50 MG/ML IJ SOLN
INTRAMUSCULAR | Status: DC | PRN
  Administered 2017-03-11: 5 mg via INTRAVENOUS
  Administered 2017-03-11: 10 mg via INTRAVENOUS
  Administered 2017-03-11 (×2): 5 mg via INTRAVENOUS
  Administered 2017-03-11: 15 mg via INTRAVENOUS
  Administered 2017-03-11: 5 mg via INTRAVENOUS

## 2017-03-11 MED ORDER — FENTANYL CITRATE (PF) 250 MCG/5ML IJ SOLN
INTRAMUSCULAR | Status: AC
  Filled 2017-03-11: qty 5

## 2017-03-11 MED ORDER — PHENYLEPHRINE HCL 10 MG/ML IJ SOLN
INTRAVENOUS | Status: DC | PRN
  Administered 2017-03-11: 25 ug/min via INTRAVENOUS

## 2017-03-11 MED ORDER — LIDOCAINE HCL (CARDIAC) 20 MG/ML IV SOLN
INTRAVENOUS | Status: DC | PRN
  Administered 2017-03-11: 100 mg via INTRAVENOUS

## 2017-03-11 MED ORDER — FENTANYL CITRATE (PF) 100 MCG/2ML IJ SOLN
INTRAMUSCULAR | Status: DC | PRN
Start: 2017-03-11 — End: 2017-03-11
  Administered 2017-03-11: 50 ug via INTRAVENOUS

## 2017-03-11 MED ORDER — KETOROLAC TROMETHAMINE 30 MG/ML IJ SOLN
INTRAMUSCULAR | Status: AC
  Filled 2017-03-11: qty 1

## 2017-03-11 MED ORDER — SUCCINYLCHOLINE 20MG/ML (10ML) SYRINGE FOR MEDFUSION PUMP - OPTIME
INTRAMUSCULAR | Status: DC | PRN
  Administered 2017-03-11: 100 mg via INTRAVENOUS

## 2017-03-11 MED ORDER — MIDAZOLAM HCL 5 MG/5ML IJ SOLN
INTRAMUSCULAR | Status: DC | PRN
  Administered 2017-03-11: 1 mg via INTRAVENOUS

## 2017-03-11 MED ORDER — ROCURONIUM BROMIDE 10 MG/ML (PF) SYRINGE
PREFILLED_SYRINGE | INTRAVENOUS | Status: AC
  Filled 2017-03-11: qty 5

## 2017-03-11 MED ORDER — EPINEPHRINE PF 1 MG/ML IJ SOLN
INTRAMUSCULAR | Status: AC
  Filled 2017-03-11: qty 1

## 2017-03-11 MED ORDER — KETOROLAC TROMETHAMINE 30 MG/ML IJ SOLN
INTRAMUSCULAR | Status: DC | PRN
  Administered 2017-03-11: 30 mg via INTRAVENOUS

## 2017-03-11 MED ORDER — BUPIVACAINE HCL (PF) 0.25 % IJ SOLN
INTRAMUSCULAR | Status: AC
  Filled 2017-03-11: qty 30

## 2017-03-11 MED ORDER — SODIUM CHLORIDE 0.9 % IR SOLN
Status: DC | PRN
  Administered 2017-03-11: 6000 mL

## 2017-03-11 MED ORDER — SUGAMMADEX SODIUM 200 MG/2ML IV SOLN
INTRAVENOUS | Status: AC
  Filled 2017-03-11: qty 2

## 2017-03-11 MED ORDER — SUGAMMADEX SODIUM 200 MG/2ML IV SOLN
INTRAVENOUS | Status: DC | PRN
  Administered 2017-03-11: 211.8 mg via INTRAVENOUS

## 2017-03-11 MED ORDER — METHOCARBAMOL 500 MG PO TABS: 500.0000 mg | ORAL_TABLET | Freq: Three times a day (TID) | ORAL | 1 refills | Status: DC | PRN

## 2017-03-11 MED ORDER — PROPOFOL 10 MG/ML IV BOLUS
INTRAVENOUS | Status: DC | PRN
  Administered 2017-03-11: 200 mg via INTRAVENOUS

## 2017-03-11 MED ORDER — BUPIVACAINE-EPINEPHRINE 0.25% -1:200000 IJ SOLN
INTRAMUSCULAR | Status: DC | PRN
  Administered 2017-03-11: 9 mL

## 2017-03-11 MED ORDER — CHLORHEXIDINE GLUCONATE 4 % EX LIQD: 60.0000 mL | Freq: Once | CUTANEOUS | Status: DC

## 2017-03-11 MED ORDER — MIDAZOLAM HCL 2 MG/2ML IJ SOLN
INTRAMUSCULAR | Status: AC
  Administered 2017-03-11: 1 mg
  Filled 2017-03-11: qty 2

## 2017-03-11 MED ORDER — PROPOFOL 10 MG/ML IV BOLUS
INTRAVENOUS | Status: AC
  Filled 2017-03-11: qty 20

## 2017-03-11 MED ORDER — PHENYLEPHRINE 40 MCG/ML (10ML) SYRINGE FOR IV PUSH (FOR BLOOD PRESSURE SUPPORT)
PREFILLED_SYRINGE | INTRAVENOUS | Status: AC
  Filled 2017-03-11: qty 10

## 2017-03-11 MED ORDER — ROCURONIUM BROMIDE 100 MG/10ML IV SOLN
INTRAVENOUS | Status: DC | PRN
  Administered 2017-03-11: 40 mg via INTRAVENOUS

## 2017-03-11 SURGICAL SUPPLY — 67 items
ANCH SUT 2 1.3X1 LD 1 STRN (Anchor) ×1 IMPLANT
ANCH SUT 360D 2 2 LD 2 STRN (Anchor) ×2 IMPLANT
ANCHOR ALL- SUT RC 2 SUT Y-K (Anchor) ×4 IMPLANT
ANCHOR ALL-SUT FLEX 1.3 Y-KNOT (Anchor) ×2 IMPLANT
ANCHOR ALL-SUT RC 2 SUT Y-K (Anchor) IMPLANT
BIT DRILL 1.3M DISPOSABLE (BIT) ×2 IMPLANT
BLADE SURG 11 STRL SS (BLADE) ×3 IMPLANT
BLADE W/14.0X25.5MM (BLADE) ×3 IMPLANT
BUR OVAL 4.0 (BURR) IMPLANT
CLOSURE WOUND 1/2 X4 (GAUZE/BANDAGES/DRESSINGS) ×1
COVER SURGICAL LIGHT HANDLE (MISCELLANEOUS) ×3 IMPLANT
DRAPE INCISE IOBAN 66X45 STRL (DRAPES) ×3 IMPLANT
DRAPE STERI 35X30 U-POUCH (DRAPES) ×3 IMPLANT
DRAPE U-SHAPE 47X51 STRL (DRAPES) ×3 IMPLANT
DRSG EMULSION OIL 3X3 NADH (GAUZE/BANDAGES/DRESSINGS) ×4 IMPLANT
DRSG PAD ABDOMINAL 8X10 ST (GAUZE/BANDAGES/DRESSINGS) ×4 IMPLANT
DURAPREP 26ML APPLICATOR (WOUND CARE) ×3 IMPLANT
ELECT NDL TIP 2.8 STRL (NEEDLE) ×1 IMPLANT
ELECT NEEDLE TIP 2.8 STRL (NEEDLE) ×3 IMPLANT
ELECT REM PT RETURN 9FT ADLT (ELECTROSURGICAL)
ELECTRODE REM PT RTRN 9FT ADLT (ELECTROSURGICAL) IMPLANT
GAUZE SPONGE 4X4 12PLY STRL (GAUZE/BANDAGES/DRESSINGS) ×3 IMPLANT
GLOVE BIOGEL PI ORTHO PRO 7.5 (GLOVE) ×2
GLOVE BIOGEL PI ORTHO PRO SZ8 (GLOVE) ×2
GLOVE ORTHO TXT STRL SZ7.5 (GLOVE) ×3 IMPLANT
GLOVE PI ORTHO PRO STRL 7.5 (GLOVE) ×1 IMPLANT
GLOVE PI ORTHO PRO STRL SZ8 (GLOVE) ×1 IMPLANT
GLOVE SURG ORTHO 8.5 STRL (GLOVE) ×3 IMPLANT
GOWN STRL REUS W/ TWL XL LVL3 (GOWN DISPOSABLE) ×4 IMPLANT
GOWN STRL REUS W/TWL XL LVL3 (GOWN DISPOSABLE) ×9
KIT BASIN OR (CUSTOM PROCEDURE TRAY) ×3 IMPLANT
KIT ROOM TURNOVER OR (KITS) ×3 IMPLANT
MANIFOLD NEPTUNE II (INSTRUMENTS) ×3 IMPLANT
NDL HYPO 25GX1X1/2 BEV (NEEDLE) ×1 IMPLANT
NDL SPNL 18GX3.5 QUINCKE PK (NEEDLE) ×1 IMPLANT
NDL SUT .5 MAYO 1.404X.05X (NEEDLE) ×1 IMPLANT
NDL SUT 6 .5 CRC .975X.05 MAYO (NEEDLE) ×1 IMPLANT
NEEDLE HYPO 25GX1X1/2 BEV (NEEDLE) ×3 IMPLANT
NEEDLE MAYO TAPER (NEEDLE) ×6
NEEDLE SPNL 18GX3.5 QUINCKE PK (NEEDLE) ×3 IMPLANT
NS IRRIG 1000ML POUR BTL (IV SOLUTION) ×3 IMPLANT
PACK SHOULDER (CUSTOM PROCEDURE TRAY) ×3 IMPLANT
PAD ARMBOARD 7.5X6 YLW CONV (MISCELLANEOUS) ×6 IMPLANT
RESECTOR FULL RADIUS 4.2MM (BLADE) ×3 IMPLANT
SET ARTHROSCOPY TUBING (MISCELLANEOUS) ×3
SET ARTHROSCOPY TUBING LN (MISCELLANEOUS) ×1 IMPLANT
SLING ARM FOAM STRAP LRG (SOFTGOODS) ×1 IMPLANT
SLING ARM FOAM STRAP MED (SOFTGOODS) IMPLANT
STRIP CLOSURE SKIN 1/2X4 (GAUZE/BANDAGES/DRESSINGS) ×2 IMPLANT
SUT BONE WAX W31G (SUTURE) ×3 IMPLANT
SUT FIBERWIRE #2 38 T-5 BLUE (SUTURE)
SUT HI-FI 2 STRAND C-2 40 (SUTURE) ×4 IMPLANT
SUT MNCRL AB 3-0 PS2 18 (SUTURE) ×3 IMPLANT
SUT VIC AB 0 CT1 27 (SUTURE) ×3
SUT VIC AB 0 CT1 27XBRD ANBCTR (SUTURE) ×1 IMPLANT
SUT VIC AB 0 CT2 27 (SUTURE) ×2 IMPLANT
SUT VIC AB 2-0 CT1 27 (SUTURE) ×3
SUT VIC AB 2-0 CT1 TAPERPNT 27 (SUTURE) ×1 IMPLANT
SUTURE FIBERWR #2 38 T-5 BLUE (SUTURE) IMPLANT
SYR CONTROL 10ML LL (SYRINGE) ×3 IMPLANT
TOWEL OR 17X24 6PK STRL BLUE (TOWEL DISPOSABLE) ×3 IMPLANT
TOWEL OR 17X26 10 PK STRL BLUE (TOWEL DISPOSABLE) ×3 IMPLANT
TUBE CONNECTING 12'X1/4 (SUCTIONS) ×1
TUBE CONNECTING 12X1/4 (SUCTIONS) ×2 IMPLANT
WAND HAND CNTRL MULTIVAC 90 (MISCELLANEOUS) ×3 IMPLANT
WATER STERILE IRR 1000ML POUR (IV SOLUTION) ×3 IMPLANT
YANKAUER SUCT BULB TIP NO VENT (SUCTIONS) ×2 IMPLANT

## 2017-03-11 NOTE — Anesthesia Procedure Notes (Addendum)
Anesthesia Regional Block: Interscalene brachial plexus block   Pre-Anesthetic Checklist: ,, timeout performed, Correct Patient, Correct Site, Correct Laterality, Correct Procedure, Correct Position, site marked, Risks and benefits discussed,  Surgical consent,  Pre-op evaluation,  At surgeon's request and post-op pain management  Laterality: Right and Upper  Prep: Dura Prep       Needles:  Injection technique: Single-shot  Needle Type: Echogenic Stimulator Needle     Needle Length: 9cm  Needle Gauge: 21   Needle insertion depth: 4 cm   Additional Needles:   Procedures: ultrasound guided,,,,,,,,  Narrative:  Start time: 03/11/2017 9:30 AM End time: 03/11/2017 9:41 AM Injection made incrementally with aspirations every 5 mL.  Performed by: Personally  Anesthesiologist: Lyn Hollingshead

## 2017-03-11 NOTE — Transfer of Care (Signed)
Immediate Anesthesia Transfer of Care Note  Patient: Diane [name]  Procedure(s) Performed: Procedure(s): RIGHT SHOULDER ARTHROSCOPY, subacromial decompression, MINI-OPEN rotator cuff repair, OPEN distal clavicle resection (Right)  Patient Location: PACU  Anesthesia Type:General  Level of Consciousness: awake, alert  and sedated  Airway & Oxygen Therapy: Patient connected to nasal cannula oxygen  Post-op Assessment: Post -op Vital signs reviewed and stable  Post vital signs: stable  Last Vitals:  Vitals:   03/11/17 0900  BP: (!) 196/84  Pulse: 82  Resp: 20  Temp: 36.8 C    Last Pain:  Vitals:   03/11/17 0900  PainSc: 6       Patients Stated Pain Goal: 3 (91/63/84 6659)  Complications: No apparent anesthesia complications

## 2017-03-11 NOTE — Interval H&P Note (Signed)
History and Physical Interval Note:  03/11/2017 9:58 AM  Diane [name]  has presented today for surgery, with the diagnosis of Right shoulder rotator cuff tear, SLAP, AC degenerative joint disease  The various methods of treatment have been discussed with the patient and family. After consideration of risks, benefits and other options for treatment, the patient has consented to  Procedure(s): RIGHT SHOULDER SCOPE, subacromial decompression, MINI-OPEN rotator cuff repair, OPEN distal clavicle resection (Right) as a surgical intervention .  The patient's history has been reviewed, patient examined, no change in status, stable for surgery.  I have reviewed the patient's chart and labs.  Questions were answered to the patient's satisfaction.     Calvina Liptak,STEVEN R

## 2017-03-11 NOTE — Anesthesia Procedure Notes (Signed)
Procedure Name: Intubation Date/Time: 03/11/2017 10:15 AM Performed by: Lavell Luster Pre-anesthesia Checklist: Patient identified, Emergency Drugs available, Suction available, Patient being monitored and Timeout performed Patient Re-evaluated:Patient Re-evaluated prior to inductionOxygen Delivery Method: Circle system utilized Preoxygenation: Pre-oxygenation with 100% oxygen Intubation Type: IV induction Ventilation: Two handed mask ventilation required Laryngoscope Size: Mac and 3 Grade View: Grade II Tube type: Oral Tube size: 7.5 mm Number of attempts: 1 Airway Equipment and Method: Stylet Placement Confirmation: ETT inserted through vocal cords under direct vision,  positive ETCO2 and breath sounds checked- equal and bilateral Secured at: 23 cm Tube secured with: Tape Dental Injury: Teeth and Oropharynx as per pre-operative assessment

## 2017-03-11 NOTE — Anesthesia Postprocedure Evaluation (Addendum)
Anesthesia Post Note  Patient: Diane [name]  Procedure(s) Performed: Procedure(s) (LRB): RIGHT SHOULDER ARTHROSCOPY, subacromial decompression, MINI-OPEN rotator cuff repair, OPEN distal clavicle resection (Right)  Patient location during evaluation: PACU Anesthesia Type: General Level of consciousness: awake and sedated Pain management: pain level controlled Vital Signs Assessment: post-procedure vital signs reviewed and stable Respiratory status: spontaneous breathing Cardiovascular status: stable Postop Assessment: no signs of nausea or vomiting Anesthetic complications: no        Last Vitals:  Vitals:   03/11/17 1232 03/11/17 1245  BP: 116/71 120/70  Pulse: 81 81  Resp: (!) 23 18  Temp: 36.4 C     Last Pain:  Vitals:   03/11/17 1232  PainSc: 0-No pain   Pain Goal: Patients Stated Pain Goal: 3 (03/11/17 0900)               [name] JR,JOHN Joseguadalupe Stan

## 2017-03-11 NOTE — Brief Op Note (Signed)
03/11/2017  12:43 PM  PATIENT:  Diane [name]  71 y.o. female  PRE-OPERATIVE DIAGNOSIS:  Right shoulder rotator cuff tear, SLAP, AC degenerative joint disease  POST-OPERATIVE DIAGNOSIS:  Right shoulder rotator cuff tear, SLAP, AC degenerative joint disease  PROCEDURE:  Procedure(s): RIGHT SHOULDER ARTHROSCOPY, subacromial decompression, MINI-OPEN rotator cuff repair, OPEN distal clavicle resection (Right)  SURGEON:  Surgeon(s) and Role:    Netta Cedars, MD - Primary  PHYSICIAN ASSISTANT:   ASSISTANTS: Ventura Bruns, PA-C   ANESTHESIA:   regional and general  EBL:  Total I/O In: 1000 [I.V.:1000] Out: 200 [Blood:200]  BLOOD ADMINISTERED:none  DRAINS: none   LOCAL MEDICATIONS USED:  MARCAINE     SPECIMEN:  No Specimen  DISPOSITION OF SPECIMEN:  N/A  COUNTS:  YES  TOURNIQUET:  * No tourniquets in log *  DICTATION: .Other Dictation: Dictation Number [id number]  PLAN OF CARE: Discharge to home after PACU  PATIENT DISPOSITION:  PACU - hemodynamically stable.   Delay start of Pharmacological VTE agent (>24hrs) due to surgical blood loss or risk of bleeding: not applicable

## 2017-03-11 NOTE — Anesthesia Preprocedure Evaluation (Addendum)
Anesthesia Evaluation  Patient identified by MRN, date of birth, ID band Patient awake    Reviewed: Allergy & Precautions, NPO status , Patient's Chart, lab work & pertinent test results  Airway Mallampati: II       Dental no notable dental hx.    Pulmonary neg pulmonary ROS,    Pulmonary exam normal breath sounds clear to auscultation       Cardiovascular hypertension, Normal cardiovascular exam Rhythm:Regular Rate:Normal + Systolic murmurs    Neuro/Psych negative neurological ROS  negative psych ROS   GI/Hepatic Neg liver ROS,   Endo/Other  diabetes, Type 2, Oral Hypoglycemic Agents  Renal/GU negative Renal ROS     Musculoskeletal   Abdominal (+) + obese,   Peds  Hematology negative hematology ROS (+)   Anesthesia Other Findings [address] [place], [place] [address]              [phone]  ------------------------------------------------------------------- Transthoracic Echocardiography  Patient:  Kalkidan, Caudell MR #:    [id number] Study Date: 06/16/2014 Gender:   F Age:    71 Height:   172.7 cm Weight:   105.2 kg BSA:    2.29 m^2 Pt. Status: Room:    3W28C  ATTENDING  Debbe Odea [id number] ADMITTING  Ghimire, Shanker M ORDERING   Ghimire, Shanker M SONOGRAPHER Mauricio Po, RDCS, CCT PERFORMING  [facility], Inpatient  cc:  ------------------------------------------------------------------- LV EF: 55% -  60%  -------------------------------------------------------------------  Reproductive/Obstetrics                            Anesthesia Physical Anesthesia Plan  ASA: II  Anesthesia Plan: General   Post-op Pain Management: GA combined w/ Regional for post-op pain   Induction: Intravenous  Airway Management Planned: Oral ETT  Additional  Equipment:   Intra-op Plan:   Post-operative Plan: Extubation in OR  Informed Consent: I have reviewed the patients History and Physical, chart, labs and discussed the procedure including the risks, benefits and alternatives for the proposed anesthesia with the patient or authorized representative who has indicated his/her understanding and acceptance.   Dental advisory given  Plan Discussed with: CRNA and Surgeon  Anesthesia Plan Comments:        Anesthesia Quick Evaluation

## 2017-03-11 NOTE — Discharge Instructions (Signed)
Ice to the shoulder as much as possible.  Keep the incision clean and dry and covered for one week, then ok to get It wet in the shower.  Change the bandage to Band Aids on Monday .    Keep the sling on while up walking around, may remove the sling while seated in the home. Hug a pillow when you are not in the sling to rest the shoulder in a good position. Lean to your right to allow gravity and the pendulum effect to get that sling out from under the arm and then back up under there when going back in it.  Do the following exercises every hour while awake to prevent stiffness - Pendulums, Lap Slides, Hug-Hitch Hike (door hinge ) rotation exercise  Follow up with therapy early next week and with Dr Veverly Fells in his office in two weeks call [phone]  [phone] for appoinment

## 2017-03-14 ENCOUNTER — Encounter (HOSPITAL_COMMUNITY): Payer: Self-pay | Admitting: Orthopedic Surgery

## 2017-03-14 DIAGNOSIS — M25511 Pain in right shoulder: Secondary | ICD-10-CM | POA: Diagnosis not present

## 2017-03-14 NOTE — Op Note (Signed)
NAME:  ,                                 ACCOUNT NO.:  MEDICAL RECORD NO.:  [id number]  LOCATION:                                 FACILITY:  PHYSICIAN:  Doran Heater. Veverly Fells, M.D.      DATE OF BIRTH:  DATE OF PROCEDURE:  03/11/2017 DATE OF DISCHARGE:                              OPERATIVE REPORT   PREOPERATIVE DIAGNOSIS:  Right shoulder rotator cuff tear, SLAP tear, acromioclavicular joint arthritis.  POSTOPERATIVE DIAGNOSIS:  Right shoulder rotator cuff tear, SLAP tear, acromioclavicular joint arthritis.  PROCEDURE PERFORMED:  Right shoulder arthroscopy with extensive intra- articular debridement of torn superior labrum anterior to posterior, arthroscopic biceps tenotomy, followed by arthroscopic subacromial decompression with CA ligament release, open distal clavicle resection, mini open rotator cuff repair, and biceps tenodesis.  ATTENDING SURGEON:  Doran Heater. Veverly Fells, MD.  ASSISTANT:  Ventura Bruns, [facility], who had scrubbed during the entire procedure and necessary for satisfactory completion of surgery.  ANESTHESIA:  General anesthesia was used plus interscalene block.  ESTIMATED BLOOD LOSS:  Minimal.  FLUID REPLACEMENT:  1200 mL crystalloid.  INSTRUMENT COUNTS:  Correct.  COMPLICATIONS:  There were no complications.  ANTIBIOTICS:  Perioperative antibiotics were given.  INDICATIONS:  The patient is a 71 year old female with a history of worsening right shoulder pain and dysfunction secondary to a rotator cuff tear, full thickness with advanced AC arthritis with outlet impingement as well as a SLAP tear.  The patient has had a failure of conservative management, desires operative treatment to restore function and eliminate pain.  Informed consent obtained.  DESCRIPTION OF PROCEDURE:  After adequate level of anesthesia achieved, the patient was positioned in a modified beach-chair position.  Right shoulder was correctly identified and sterilely prepped and draped in the  usual manner.  Time-out was called.  We initiated surgery using standard scope portals including anterior, posterior, and lateral portals.  I identified significant tearing of the superior labrum, biceps anchor, performed a labral debridement with biceps tenotomy. Debrided back to stable labral rim.  Anterior-inferior, posterior- inferior labrum intact.  Articular cartilage in good shape. Subscapularis was intact.  Rotator cuff at the supraspinatus and infraspinatus tear and teres minor was normal.  We then went ahead and placed the scope in subacromial space thorough bursectomy and acromioplasty was performed creating a type 1 acromial shape with a butcher block technique utilizing a high-speed bur.  We had released the CA ligament.  Nice decompression of rotator cuff outlet.  We then directed our attention towards the [facility] joint.  We performed an open incision.  A small saber incision created over the [facility] joint.  Dissection down through subcutaneous tissues.  The incision was __________ skin lines.  We identified the deltotrapezial fascia and incised in line with distal clavicle.  Subperiosteal dissection of distal clavicle performed, followed by excision of distal 2-3 mm of bone using oscillating saw.  We irrigated thoroughly the AC interval, applied bone wax to the cut in the clavicle.  We ensured that posterior and anterior AC ligaments intact. We then repaired deltotrapezial fascia anatomically with 0 Vicryl suture,  followed by 2-0 Vicryl for subcutaneous closure, and 4-0 Monocryl for skin.  We dressed the rotator cuff and biceps through a mini open incision, started at the anterolateral border of the acromion, extending distally about 3-4 cm dissection down through subcutaneous tissues using needle-tip Bovie after 10 blade scalpel for the incision. We identified the deltoid raphae between the anterior and lateral heads of the deltoid.  We used a needle-tip Bovie to open that up,  placed Arthrex distractor.  We had a nice view of the bicipital groove.  We used the needle-tip Bovie to open up the soft tissue over the top of the groove, delivered the biceps tendon out of the wound.  We whipstitched to the appropriate length with #2 Hi-Fi suture with baseball stitch.  We cut off the remaining biceps tendon.  We then went ahead and prepared the floor of the biceps groove with a needle-tip Bovie and a Soil scientist getting down to bleeding bone.  We placed a Y-Knot flex anchor through the floor of the biceps groove and brought that suture up in a mattress fashion through the reinforced portion of tendon.  We then took the longitudinal whipstitch ends and brought them up through a soft tissue bridge proximal to the biceps groove involving the proximal subscapularis and also the supraspinatus.  We tied that up top.  Thus, we had both longitudinal pull as well as compression.  We then went to the rotator cuff tear.  This is U-shaped tear.  We went ahead, mobilized with a Cobb elevator and a cuff grasper.  We placed #2 Hi-Fi suture x4 and the free end of the tendon to mobilize it also for repair of the lateral row.  We prepared the greater tuberosity with rongeur, curette, placed 2 Y-Knot RC anchors at the articular margin of the greater tuberosity and brought the sutures up.  There were double loaded with #2 Hi-Fi suture, so we had 2 mattress sutures per anchor reinforcing the medial portion of the footprint.  We then took the sutures down through drill holes and tied over bone bridge.  For the lateral row, we had a solid watertight closure.  We ranged the shoulder, no relative motion at the repair site, no impingement.  We irrigated thoroughly and then closed the deltoid anatomically with 0 Vicryl suture, followed by 2-0 Vicryl for subcutaneous closure, and 4-0 Monocryl for skin and portals. Steri-Strips applied, followed by sterile dressing.  The patient tolerated the  surgery well.     Doran Heater. Veverly Fells, M.D.     SRN/MEDQ  D:  03/11/2017  T:  03/11/2017  Job:  [id number]

## 2017-03-15 DIAGNOSIS — Z0000 Encounter for general adult medical examination without abnormal findings: Secondary | ICD-10-CM | POA: Diagnosis not present

## 2017-03-17 DIAGNOSIS — M25511 Pain in right shoulder: Secondary | ICD-10-CM | POA: Diagnosis not present

## 2017-03-22 DIAGNOSIS — I10 Essential (primary) hypertension: Secondary | ICD-10-CM | POA: Diagnosis not present

## 2017-03-22 DIAGNOSIS — Z0000 Encounter for general adult medical examination without abnormal findings: Secondary | ICD-10-CM | POA: Diagnosis not present

## 2017-03-22 DIAGNOSIS — M19011 Primary osteoarthritis, right shoulder: Secondary | ICD-10-CM | POA: Diagnosis not present

## 2017-03-22 DIAGNOSIS — E1165 Type 2 diabetes mellitus with hyperglycemia: Secondary | ICD-10-CM | POA: Diagnosis not present

## 2017-03-22 DIAGNOSIS — Z4789 Encounter for other orthopedic aftercare: Secondary | ICD-10-CM | POA: Diagnosis not present

## 2017-03-22 DIAGNOSIS — S46011D Strain of muscle(s) and tendon(s) of the rotator cuff of right shoulder, subsequent encounter: Secondary | ICD-10-CM | POA: Diagnosis not present

## 2017-03-23 DIAGNOSIS — N390 Urinary tract infection, site not specified: Secondary | ICD-10-CM | POA: Diagnosis not present

## 2017-03-23 DIAGNOSIS — E1165 Type 2 diabetes mellitus with hyperglycemia: Secondary | ICD-10-CM | POA: Diagnosis not present

## 2017-03-23 DIAGNOSIS — R21 Rash and other nonspecific skin eruption: Secondary | ICD-10-CM | POA: Diagnosis not present

## 2017-03-23 DIAGNOSIS — M25511 Pain in right shoulder: Secondary | ICD-10-CM | POA: Diagnosis not present

## 2017-03-23 DIAGNOSIS — M1990 Unspecified osteoarthritis, unspecified site: Secondary | ICD-10-CM | POA: Diagnosis not present

## 2017-03-23 DIAGNOSIS — I10 Essential (primary) hypertension: Secondary | ICD-10-CM | POA: Diagnosis not present

## 2017-03-23 DIAGNOSIS — M064 Inflammatory polyarthropathy: Secondary | ICD-10-CM | POA: Diagnosis not present

## 2017-03-24 DIAGNOSIS — M25511 Pain in right shoulder: Secondary | ICD-10-CM | POA: Diagnosis not present

## 2017-03-30 DIAGNOSIS — M25511 Pain in right shoulder: Secondary | ICD-10-CM | POA: Diagnosis not present

## 2017-04-01 DIAGNOSIS — M25511 Pain in right shoulder: Secondary | ICD-10-CM | POA: Diagnosis not present

## 2017-04-05 DIAGNOSIS — M25511 Pain in right shoulder: Secondary | ICD-10-CM | POA: Diagnosis not present

## 2017-04-07 DIAGNOSIS — M25511 Pain in right shoulder: Secondary | ICD-10-CM | POA: Diagnosis not present

## 2017-04-08 NOTE — Addendum Note (Signed)
Addendum  created 04/08/17 1231 by Lyn Hollingshead, MD   Sign clinical note

## 2017-04-14 DIAGNOSIS — M25511 Pain in right shoulder: Secondary | ICD-10-CM | POA: Diagnosis not present

## 2017-04-19 ENCOUNTER — Encounter (HOSPITAL_COMMUNITY): Payer: Self-pay | Admitting: Emergency Medicine

## 2017-04-19 ENCOUNTER — Observation Stay (HOSPITAL_COMMUNITY)
Admission: EM | Admit: 2017-04-19 | Discharge: 2017-04-21 | Disposition: A | Discharge status: Home / Self Care | Payer: Medicare Other | Attending: Internal Medicine | Admitting: Internal Medicine

## 2017-04-19 ENCOUNTER — Emergency Department (HOSPITAL_COMMUNITY): Payer: Medicare Other

## 2017-04-19 ENCOUNTER — Ambulatory Visit (HOSPITAL_COMMUNITY)
Admission: EM | Admit: 2017-04-19 | Discharge: 2017-04-19 | Disposition: A | Discharge status: Nursing Home (Includes ICF) | Payer: Medicare Other | Source: Home / Self Care

## 2017-04-19 DIAGNOSIS — R072 Precordial pain: Secondary | ICD-10-CM

## 2017-04-19 DIAGNOSIS — R011 Cardiac murmur, unspecified: Secondary | ICD-10-CM | POA: Diagnosis not present

## 2017-04-19 DIAGNOSIS — M1990 Unspecified osteoarthritis, unspecified site: Secondary | ICD-10-CM | POA: Diagnosis not present

## 2017-04-19 DIAGNOSIS — Z7982 Long term (current) use of aspirin: Secondary | ICD-10-CM | POA: Insufficient documentation

## 2017-04-19 DIAGNOSIS — E1140 Type 2 diabetes mellitus with diabetic neuropathy, unspecified: Secondary | ICD-10-CM | POA: Insufficient documentation

## 2017-04-19 DIAGNOSIS — E784 Other hyperlipidemia: Secondary | ICD-10-CM | POA: Insufficient documentation

## 2017-04-19 DIAGNOSIS — J810 Acute pulmonary edema: Secondary | ICD-10-CM | POA: Diagnosis not present

## 2017-04-19 DIAGNOSIS — I10 Essential (primary) hypertension: Secondary | ICD-10-CM | POA: Diagnosis not present

## 2017-04-19 DIAGNOSIS — Z7984 Long term (current) use of oral hypoglycemic drugs: Secondary | ICD-10-CM | POA: Insufficient documentation

## 2017-04-19 DIAGNOSIS — R0789 Other chest pain: Secondary | ICD-10-CM | POA: Diagnosis not present

## 2017-04-19 DIAGNOSIS — R21 Rash and other nonspecific skin eruption: Secondary | ICD-10-CM | POA: Insufficient documentation

## 2017-04-19 DIAGNOSIS — I110 Hypertensive heart disease with heart failure: Secondary | ICD-10-CM | POA: Insufficient documentation

## 2017-04-19 DIAGNOSIS — R079 Chest pain, unspecified: Secondary | ICD-10-CM | POA: Diagnosis not present

## 2017-04-19 DIAGNOSIS — Z6835 Body mass index (BMI) 35.0-35.9, adult: Secondary | ICD-10-CM | POA: Diagnosis not present

## 2017-04-19 DIAGNOSIS — I4581 Long QT syndrome: Secondary | ICD-10-CM | POA: Diagnosis not present

## 2017-04-19 DIAGNOSIS — E118 Type 2 diabetes mellitus with unspecified complications: Secondary | ICD-10-CM

## 2017-04-19 DIAGNOSIS — R9431 Abnormal electrocardiogram [ECG] [EKG]: Secondary | ICD-10-CM | POA: Diagnosis not present

## 2017-04-19 DIAGNOSIS — E669 Obesity, unspecified: Secondary | ICD-10-CM | POA: Insufficient documentation

## 2017-04-19 DIAGNOSIS — K219 Gastro-esophageal reflux disease without esophagitis: Secondary | ICD-10-CM | POA: Insufficient documentation

## 2017-04-19 DIAGNOSIS — E7849 Other hyperlipidemia: Secondary | ICD-10-CM

## 2017-04-19 DIAGNOSIS — J811 Chronic pulmonary edema: Secondary | ICD-10-CM | POA: Diagnosis present

## 2017-04-19 DIAGNOSIS — I509 Heart failure, unspecified: Secondary | ICD-10-CM | POA: Insufficient documentation

## 2017-04-19 DIAGNOSIS — R0602 Shortness of breath: Secondary | ICD-10-CM

## 2017-04-19 LAB — CBC
HEMATOCRIT: 39.8 % (ref 36.0–46.0)
HEMOGLOBIN: 12.9 g/dL (ref 12.0–15.0)
MCH: 29.1 pg (ref 26.0–34.0)
MCHC: 32.4 g/dL (ref 30.0–36.0)
MCV: 89.6 fL (ref 78.0–100.0)
Platelets: 247 10*3/uL (ref 150–400)
RBC: 4.44 MIL/uL (ref 3.87–5.11)
RDW: 14.5 % (ref 11.5–15.5)
WBC: 6.3 10*3/uL (ref 4.0–10.5)

## 2017-04-19 LAB — BASIC METABOLIC PANEL
ANION GAP: 8 (ref 5–15)
BUN: 11 mg/dL (ref 6–20)
CHLORIDE: 108 mmol/L (ref 101–111)
CO2: 24 mmol/L (ref 22–32)
Calcium: 9.2 mg/dL (ref 8.9–10.3)
Creatinine, Ser: 0.67 mg/dL (ref 0.44–1.00)
GFR calc Af Amer: >60 mL/min (ref >60)
GLUCOSE: 106 mg/dL — AB (ref 65–99)
POTASSIUM: 3.9 mmol/L (ref 3.5–5.1)
Sodium: 140 mmol/L (ref 135–145)

## 2017-04-19 LAB — I-STAT TROPONIN, ED: Troponin i, poc: 0 ng/mL (ref 0.00–0.08)

## 2017-04-19 LAB — BRAIN NATRIURETIC PEPTIDE: B Natriuretic Peptide: 49.7 pg/mL (ref 0.0–100.0)

## 2017-04-19 LAB — TROPONIN I: Troponin I: <0.03 ng/mL (ref <0.03)

## 2017-04-19 LAB — GLUCOSE, CAPILLARY: GLUCOSE-CAPILLARY: 159 mg/dL — AB (ref 65–99)

## 2017-04-19 IMAGING — DX DG CHEST 2V
2 series · 2 of 2 positions shown · non-contrast
Comparison: Chest radiographs 10/26/2015 and earlier.

CLINICAL DATA: 70-year-old female with left chest pain, pain under
the left breast for 2 days. Shortness of breath. Recent left
shoulder surgery.

EXAM:
CHEST  2 VIEW

[chest pa]
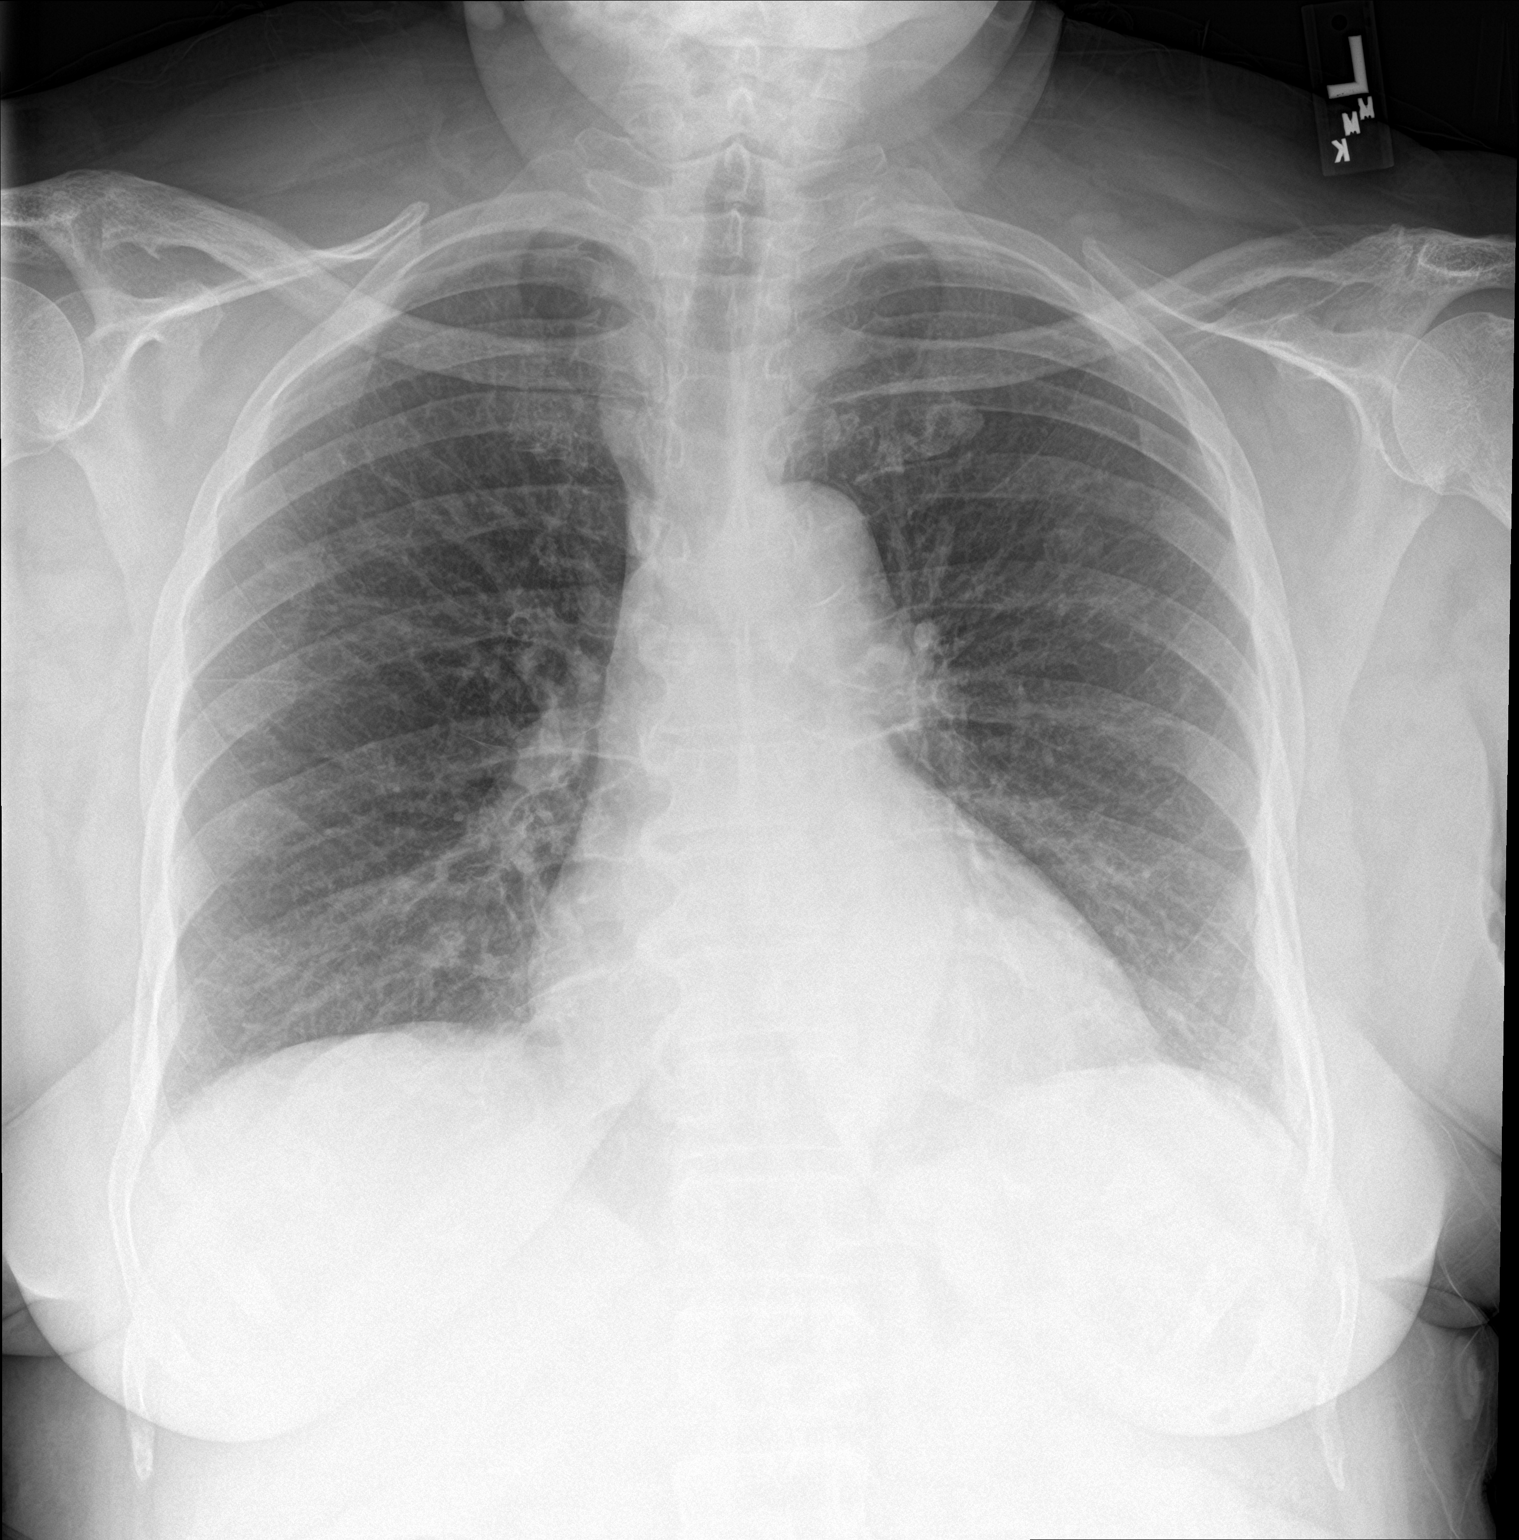

[chest lat]
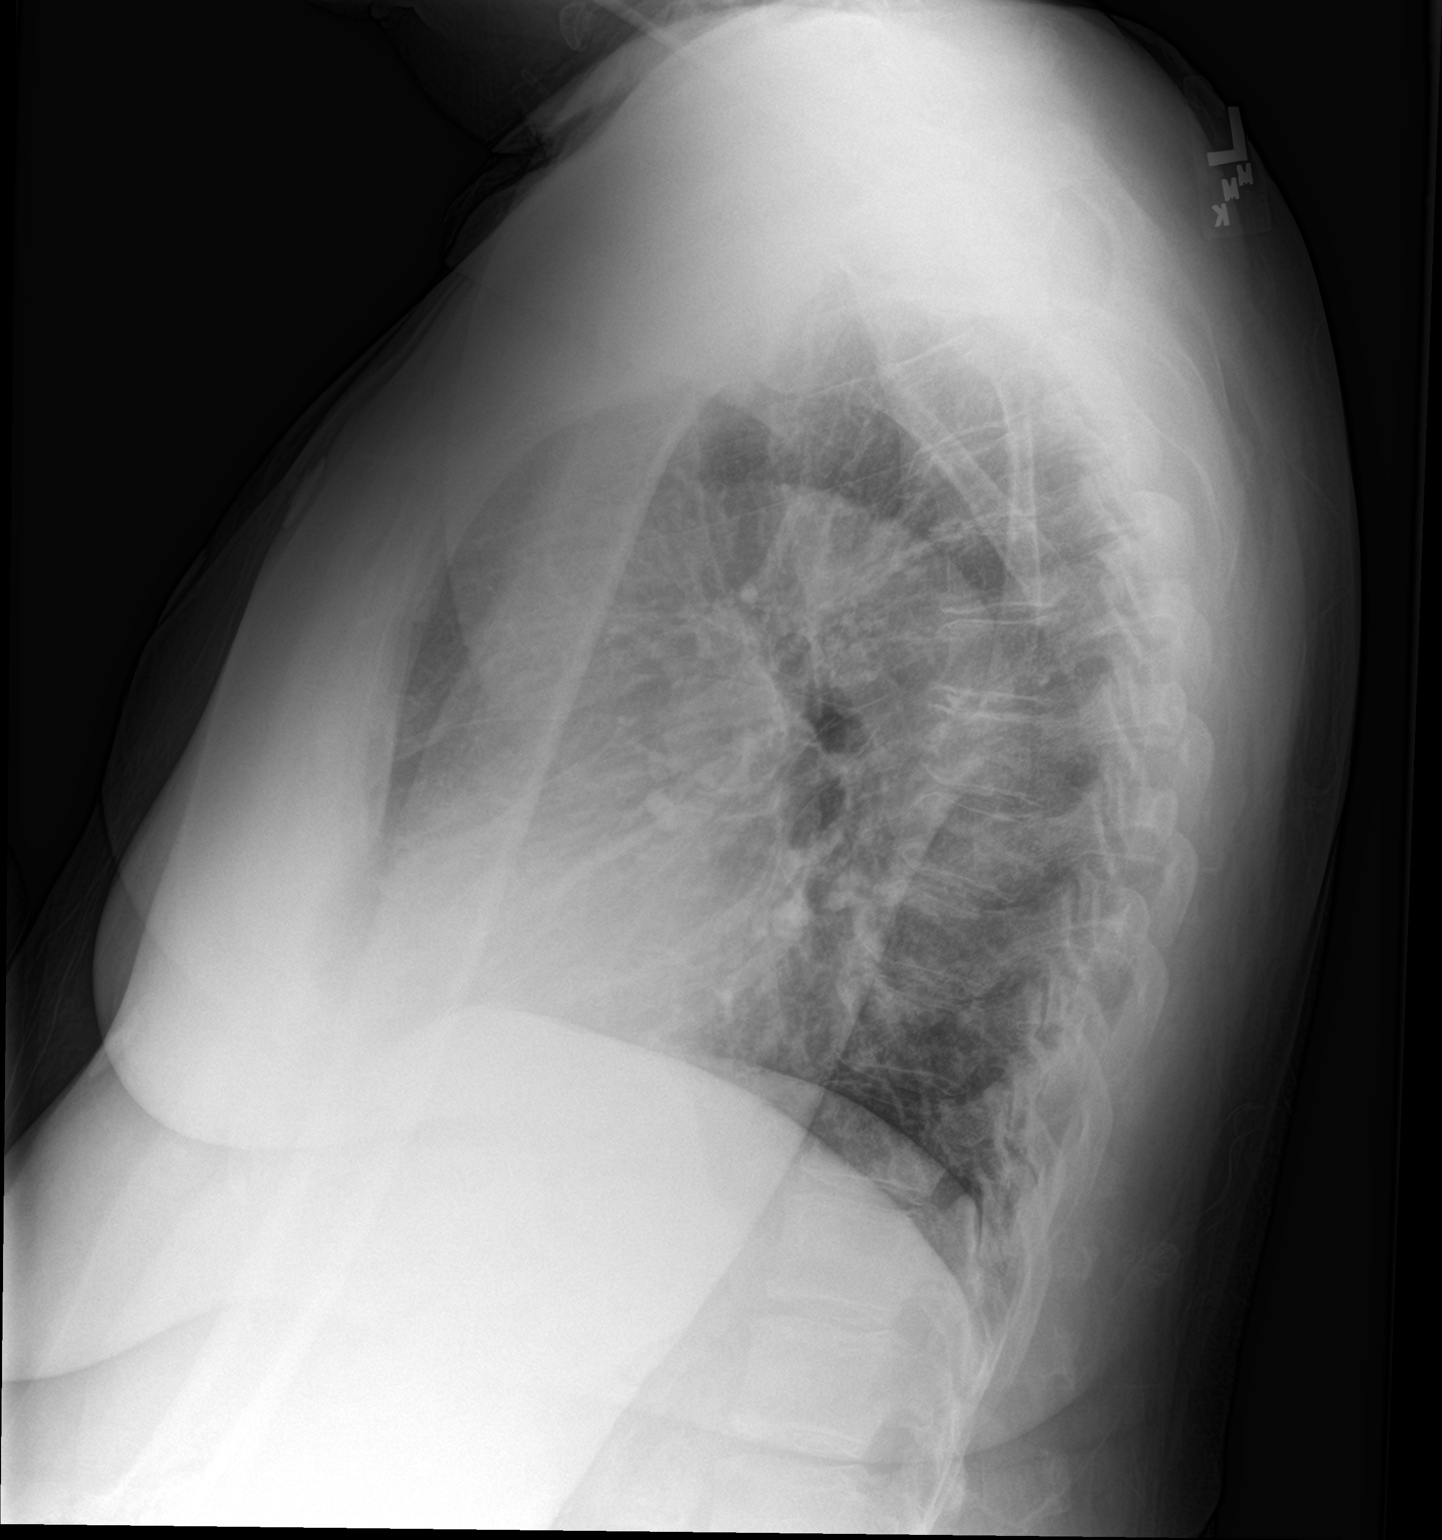

[2 of 2 positions shown; findings below may reference images not displayed]

FINDINGS: Stable lung volumes. Stable mild cardiomegaly. Other mediastinal
contours are within normal limits. Visualized tracheal air column is
within normal limits. No pneumothorax, pleural effusion or confluent
pulmonary opacity. Mildly increased pulmonary interstitial markings
today appear related to increased vascularity. No pleural fluid
identified. No acute osseous abnormality identified. Paucity of
bowel gas in the upper abdomen.
IMPRESSION: 1. Mild increased pulmonary interstitial opacity.
Top differential considerations include mild/developing interstitial
edema, chronic pulmonary interstitial disease increase since 8710,
and less likely acute viral/atypical respiratory infection.
2. No other acute cardiopulmonary abnormality.

## 2017-04-19 MED ORDER — HYDROXYCHLOROQUINE SULFATE 200 MG PO TABS
200.0000 mg | ORAL_TABLET | Freq: Two times a day (BID) | ORAL | Status: DC
  Administered 2017-04-20 – 2017-04-21 (×3): 200 mg via ORAL
  Filled 2017-04-19 (×4): qty 1

## 2017-04-19 MED ORDER — NAPHAZOLINE-GLYCERIN 0.012-0.2 % OP SOLN
1.0000 [drp] | Freq: Three times a day (TID) | OPHTHALMIC | Status: DC | PRN
  Filled 2017-04-19: qty 15

## 2017-04-19 MED ORDER — ACETAMINOPHEN 325 MG PO TABS: 650.0000 mg | ORAL_TABLET | ORAL | Status: DC | PRN

## 2017-04-19 MED ORDER — ASPIRIN 81 MG PO CHEW
324.0000 mg | CHEWABLE_TABLET | Freq: Once | ORAL | Status: AC
  Administered 2017-04-19: 324 mg via ORAL
  Filled 2017-04-19: qty 4

## 2017-04-19 MED ORDER — TELMISARTAN-AMLODIPINE 80-5 MG PO TABS: 1.0000 | ORAL_TABLET | Freq: Every day | ORAL | Status: DC

## 2017-04-19 MED ORDER — ENOXAPARIN SODIUM 40 MG/0.4ML ~~LOC~~ SOLN
40.0000 mg | SUBCUTANEOUS | Status: DC
  Administered 2017-04-19 – 2017-04-20 (×2): 40 mg via SUBCUTANEOUS
  Filled 2017-04-19 (×3): qty 0.4

## 2017-04-19 MED ORDER — LORATADINE 10 MG PO TABS: 10.0000 mg | ORAL_TABLET | Freq: Every day | ORAL | Status: DC | PRN

## 2017-04-19 MED ORDER — GI COCKTAIL ~~LOC~~: 30.0000 mL | Freq: Four times a day (QID) | ORAL | Status: DC | PRN

## 2017-04-19 MED ORDER — FUROSEMIDE 10 MG/ML IJ SOLN
20.0000 mg | Freq: Once | INTRAMUSCULAR | Status: AC
  Administered 2017-04-19: 20 mg via INTRAVENOUS
  Filled 2017-04-19: qty 2

## 2017-04-19 MED ORDER — MORPHINE SULFATE (PF) 4 MG/ML IV SOLN: 2.0000 mg | INTRAVENOUS | Status: DC | PRN

## 2017-04-19 MED ORDER — ONDANSETRON HCL 4 MG/2ML IJ SOLN: 4.0000 mg | Freq: Four times a day (QID) | INTRAMUSCULAR | Status: DC | PRN

## 2017-04-19 MED ORDER — IRBESARTAN 150 MG PO TABS
300.0000 mg | ORAL_TABLET | Freq: Every day | ORAL | Status: DC
  Administered 2017-04-20 – 2017-04-21 (×2): 300 mg via ORAL
  Filled 2017-04-19 (×3): qty 2

## 2017-04-19 MED ORDER — GABAPENTIN 300 MG PO CAPS
300.0000 mg | ORAL_CAPSULE | Freq: Two times a day (BID) | ORAL | Status: DC
  Administered 2017-04-19 – 2017-04-21 (×4): 300 mg via ORAL
  Filled 2017-04-19 (×4): qty 1

## 2017-04-19 MED ORDER — AMLODIPINE BESYLATE 5 MG PO TABS
5.0000 mg | ORAL_TABLET | Freq: Every day | ORAL | Status: DC
  Administered 2017-04-20 – 2017-04-21 (×2): 5 mg via ORAL
  Filled 2017-04-19 (×3): qty 1

## 2017-04-19 MED ORDER — INSULIN ASPART 100 UNIT/ML ~~LOC~~ SOLN: 0.0000 [IU] | Freq: Every day | SUBCUTANEOUS | Status: DC

## 2017-04-19 MED ORDER — PNEUMOCOCCAL VAC POLYVALENT 25 MCG/0.5ML IJ INJ
0.5000 mL | INJECTION | INTRAMUSCULAR | Status: DC
  Filled 2017-04-19 (×2): qty 0.5

## 2017-04-19 MED ORDER — ASPIRIN 81 MG PO CHEW
81.0000 mg | CHEWABLE_TABLET | Freq: Every day | ORAL | Status: DC
  Administered 2017-04-19 – 2017-04-20 (×2): 81 mg via ORAL
  Filled 2017-04-19 (×2): qty 1

## 2017-04-19 MED ORDER — INSULIN ASPART 100 UNIT/ML ~~LOC~~ SOLN
0.0000 [IU] | Freq: Three times a day (TID) | SUBCUTANEOUS | Status: DC
  Administered 2017-04-20 – 2017-04-21 (×3): 1 [IU] via SUBCUTANEOUS
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
## 2017-04-19 NOTE — ED Notes (Signed)
ED Provider at bedside. 

## 2017-04-19 NOTE — ED Triage Notes (Signed)
Pt sts CP x 2 days; pt denies other sx
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
## 2017-04-19 NOTE — ED Notes (Signed)
Patient given something to eat per EDP approval  

## 2017-04-20 ENCOUNTER — Observation Stay (HOSPITAL_COMMUNITY): Payer: Medicare Other

## 2017-04-20 ENCOUNTER — Observation Stay (HOSPITAL_BASED_OUTPATIENT_CLINIC_OR_DEPARTMENT_OTHER): Payer: Medicare Other

## 2017-04-20 DIAGNOSIS — M1990 Unspecified osteoarthritis, unspecified site: Secondary | ICD-10-CM | POA: Diagnosis not present

## 2017-04-20 DIAGNOSIS — I10 Essential (primary) hypertension: Secondary | ICD-10-CM

## 2017-04-20 DIAGNOSIS — I110 Hypertensive heart disease with heart failure: Secondary | ICD-10-CM | POA: Diagnosis not present

## 2017-04-20 DIAGNOSIS — R9431 Abnormal electrocardiogram [ECG] [EKG]: Secondary | ICD-10-CM

## 2017-04-20 DIAGNOSIS — J811 Chronic pulmonary edema: Secondary | ICD-10-CM | POA: Diagnosis present

## 2017-04-20 DIAGNOSIS — R0602 Shortness of breath: Secondary | ICD-10-CM | POA: Diagnosis not present

## 2017-04-20 DIAGNOSIS — R079 Chest pain, unspecified: Secondary | ICD-10-CM

## 2017-04-20 DIAGNOSIS — R0789 Other chest pain: Secondary | ICD-10-CM | POA: Diagnosis not present

## 2017-04-20 DIAGNOSIS — R011 Cardiac murmur, unspecified: Secondary | ICD-10-CM | POA: Diagnosis not present

## 2017-04-20 DIAGNOSIS — I509 Heart failure, unspecified: Secondary | ICD-10-CM | POA: Diagnosis not present

## 2017-04-20 DIAGNOSIS — E784 Other hyperlipidemia: Secondary | ICD-10-CM | POA: Diagnosis not present

## 2017-04-20 DIAGNOSIS — Z6835 Body mass index (BMI) 35.0-35.9, adult: Secondary | ICD-10-CM | POA: Diagnosis not present

## 2017-04-20 DIAGNOSIS — I5030 Unspecified diastolic (congestive) heart failure: Secondary | ICD-10-CM | POA: Diagnosis not present

## 2017-04-20 DIAGNOSIS — E669 Obesity, unspecified: Secondary | ICD-10-CM | POA: Diagnosis not present

## 2017-04-20 DIAGNOSIS — R600 Localized edema: Secondary | ICD-10-CM | POA: Diagnosis not present

## 2017-04-20 DIAGNOSIS — I4581 Long QT syndrome: Secondary | ICD-10-CM | POA: Diagnosis not present

## 2017-04-20 DIAGNOSIS — R21 Rash and other nonspecific skin eruption: Secondary | ICD-10-CM | POA: Diagnosis not present

## 2017-04-20 DIAGNOSIS — E1140 Type 2 diabetes mellitus with diabetic neuropathy, unspecified: Secondary | ICD-10-CM | POA: Diagnosis not present

## 2017-04-20 HISTORY — DX: Abnormal electrocardiogram (ECG) (EKG): R94.31

## 2017-04-20 HISTORY — DX: Chronic pulmonary edema: J81.1

## 2017-04-20 LAB — ECHOCARDIOGRAM COMPLETE
AO mean calculated velocity dopler: 110 cm/s
AV Area mean vel: 2.33 cm2
AV VEL mean LVOT/AV: 0.82
AV peak Index: 0.91
AV vel: 2.3
AVA: 2.3 cm2
AVAREAMEANVIN: 1.01 cm2/m2
AVAREAVTI: 2.09 cm2
AVAREAVTIIND: 1 cm2/m2
AVG: 6 mmHg
AVLVOTPG: 7 mmHg
AVPG: 14 mmHg
AVPKVEL: 185 cm/s
Ao pk vel: 0.74 m/s
Ao-asc: 35 cm
CHL CUP AV VALUE AREA INDEX: 1
E decel time: 257 msec
EERAT: 6.56
FS: 28 % (ref 28–44)
HEIGHTINCHES: 68 in
IV/PV OW: 1.14
LA ID, A-P, ES: 40 mm
LA diam end sys: 40 mm
LA diam index: 1.74 cm/m2
LA vol A4C: 34.9 ml
LAVOL: 44 mL
LAVOLIN: 19.1 mL/m2
LV TDI E'LATERAL: 9.76
LV TDI E'MEDIAL: 3.99
LV dias vol: 93 mL (ref 46–106)
LV e' LATERAL: 9.76 cm/s
LVDIAVOLIN: 40 mL/m2
LVEEAVG: 6.56
LVEEMED: 6.56
LVOT VTI: 27 cm
LVOT area: 2.84 cm2
LVOT peak vel: 136 cm/s
LVOTD: 19 mm
LVOTSV: 77 mL
LVOTVTI: 0.81 cm
LVSYSVOL: 37 mL (ref 14–42)
LVSYSVOLIN: 16 mL/m2
Lateral S' vel: 15 cm/s
MV Dec: 257
MV pk A vel: 83.4 m/s
MVPKEVEL: 64 m/s
PW: 11.6 mm — AB (ref 0.6–1.1)
RV TAPSE: 25.3 mm
RV sys press: 27 mmHg
Reg peak vel: 247 cm/s
Simpson's disk: 60
Stroke v: 56 ml
TR max vel: 247 cm/s
VTI: 33.3 cm
WEIGHTICAEL: 3760 [oz_av]

## 2017-04-20 LAB — GLUCOSE, CAPILLARY
GLUCOSE-CAPILLARY: 136 mg/dL — AB (ref 65–99)
GLUCOSE-CAPILLARY: 144 mg/dL — AB (ref 65–99)
GLUCOSE-CAPILLARY: 115 mg/dL — AB (ref 65–99)

## 2017-04-20 LAB — CBC WITH DIFFERENTIAL/PLATELET
Basophils Absolute: 0 10*3/uL (ref 0.0–0.1)
Basophils Relative: 0 %
EOS ABS: 0.2 10*3/uL (ref 0.0–0.7)
EOS PCT: 3 %
HCT: 38.6 % (ref 36.0–46.0)
Hemoglobin: 12.4 g/dL (ref 12.0–15.0)
LYMPHS ABS: 2 10*3/uL (ref 0.7–4.0)
LYMPHS PCT: 31 %
MCH: 28.8 pg (ref 26.0–34.0)
MCHC: 32.1 g/dL (ref 30.0–36.0)
MCV: 89.6 fL (ref 78.0–100.0)
MONO ABS: 0.6 10*3/uL (ref 0.1–1.0)
Monocytes Relative: 9 %
Neutro Abs: 3.7 10*3/uL (ref 1.7–7.7)
Neutrophils Relative %: 57 %
PLATELETS: 233 10*3/uL (ref 150–400)
RBC: 4.31 MIL/uL (ref 3.87–5.11)
RDW: 14.4 % (ref 11.5–15.5)
WBC: 6.5 10*3/uL (ref 4.0–10.5)

## 2017-04-20 LAB — NM MYOCAR MULTI W/SPECT W/WALL MOTION / EF
Exercise duration (min): 5 min
Exercise duration (sec): 1 s
Peak HR: 94 {beats}/min
Rest HR: 78 {beats}/min

## 2017-04-20 LAB — BASIC METABOLIC PANEL
ANION GAP: 9 (ref 5–15)
BUN: 9 mg/dL (ref 6–20)
CHLORIDE: 105 mmol/L (ref 101–111)
CO2: 26 mmol/L (ref 22–32)
CREATININE: 0.53 mg/dL (ref 0.44–1.00)
Calcium: 8.7 mg/dL — ABNORMAL LOW (ref 8.9–10.3)
GFR calc non Af Amer: >60 mL/min (ref >60)
Glucose, Bld: 127 mg/dL — ABNORMAL HIGH (ref 65–99)
POTASSIUM: 3.6 mmol/L (ref 3.5–5.1)
SODIUM: 140 mmol/L (ref 135–145)

## 2017-04-20 LAB — LIPID PANEL
CHOL/HDL RATIO: 4.1 ratio
Cholesterol: 160 mg/dL (ref 0–200)
HDL: 39 mg/dL — ABNORMAL LOW (ref >40)
LDL Cholesterol: 89 mg/dL (ref 0–99)
Triglycerides: 160 mg/dL — ABNORMAL HIGH (ref <150)
VLDL: 32 mg/dL (ref 0–40)

## 2017-04-20 LAB — PROTIME-INR
INR: 1.13
PROTHROMBIN TIME: 14.6 s (ref 11.4–15.2)

## 2017-04-20 LAB — TROPONIN I

## 2017-04-20 IMAGING — CR DG CHEST 2V
2 series · 2 of 2 positions shown · non-contrast
Comparison: 04/19/2017

CLINICAL DATA: Shortness of Breath

EXAM:
CHEST  2 VIEW

[chest pa]
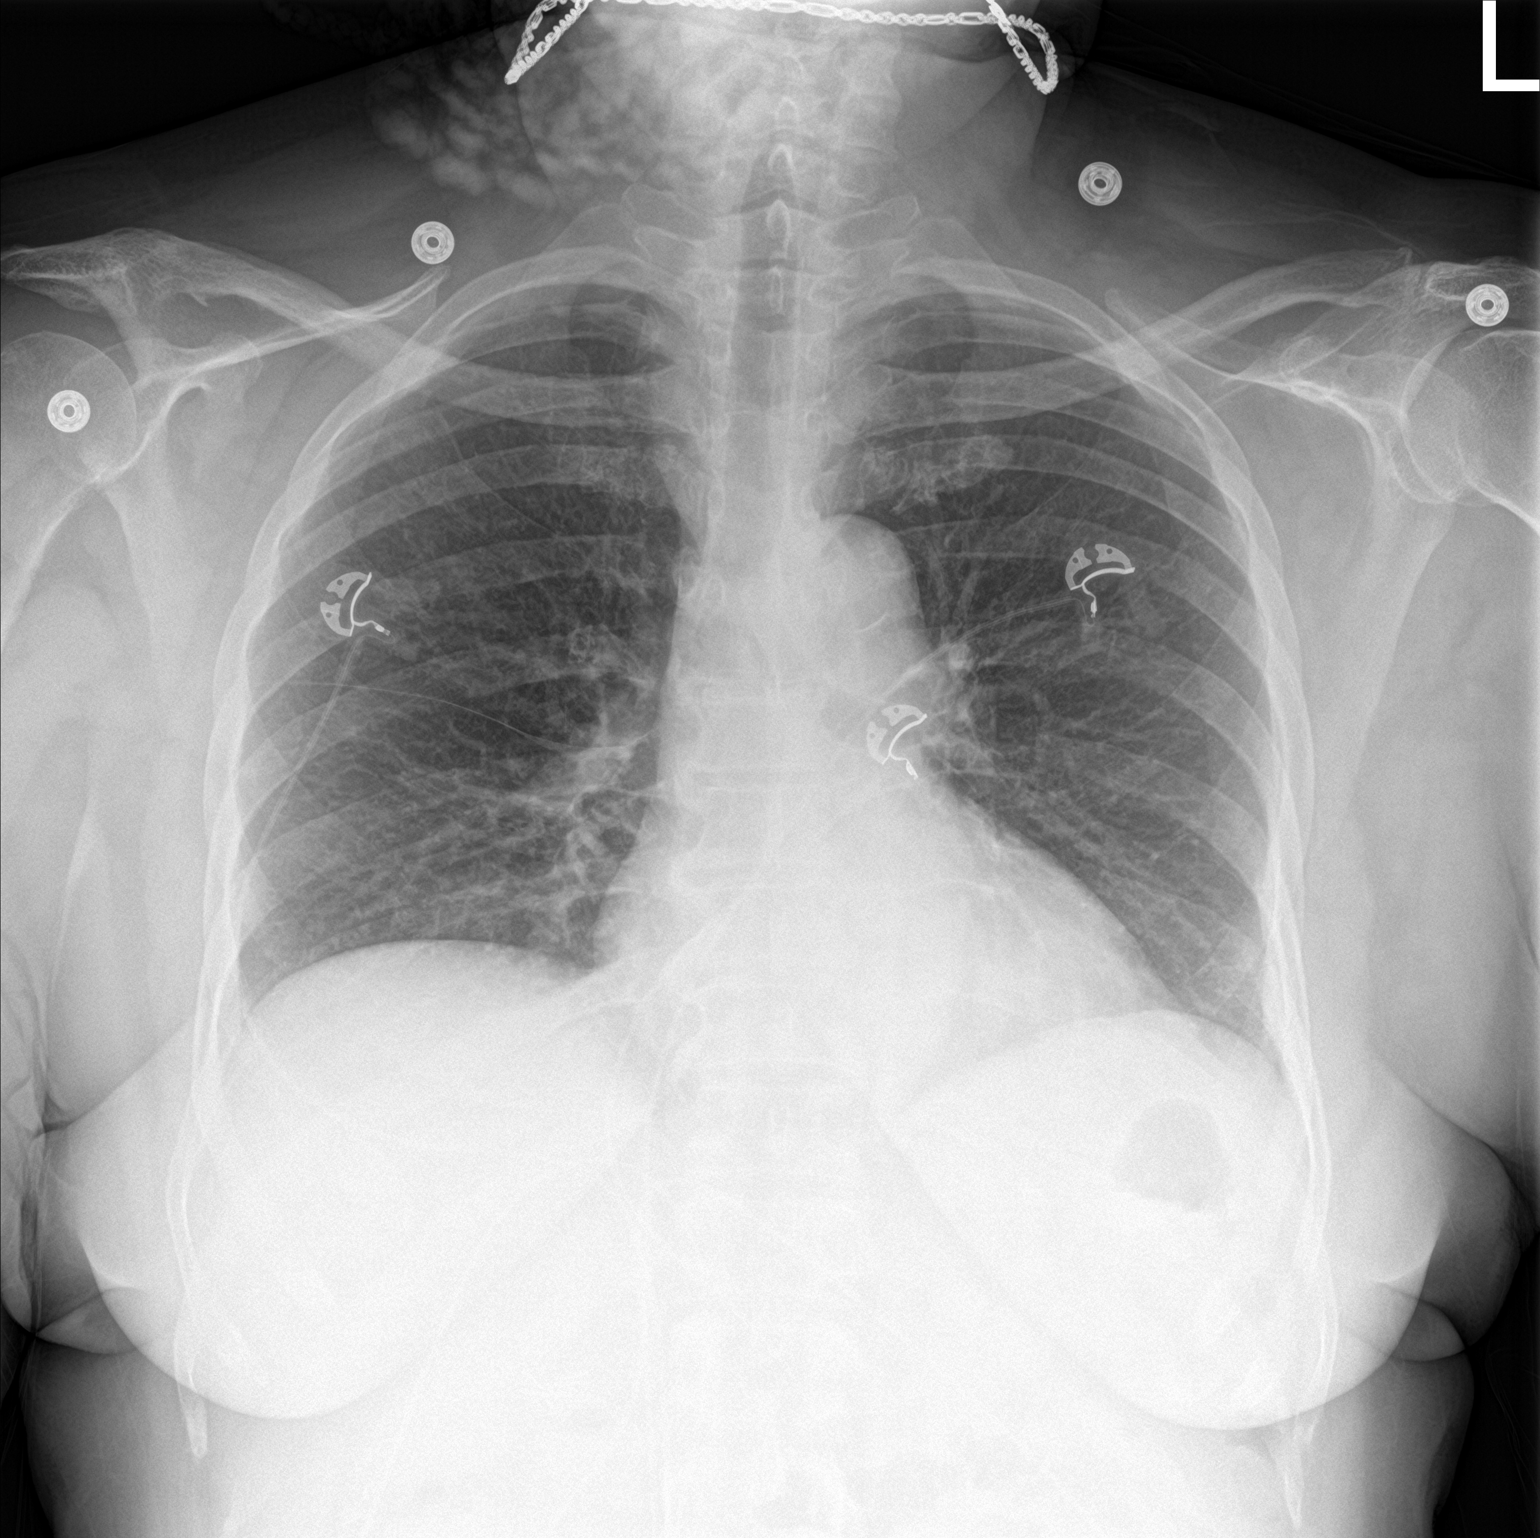

[chest lat]
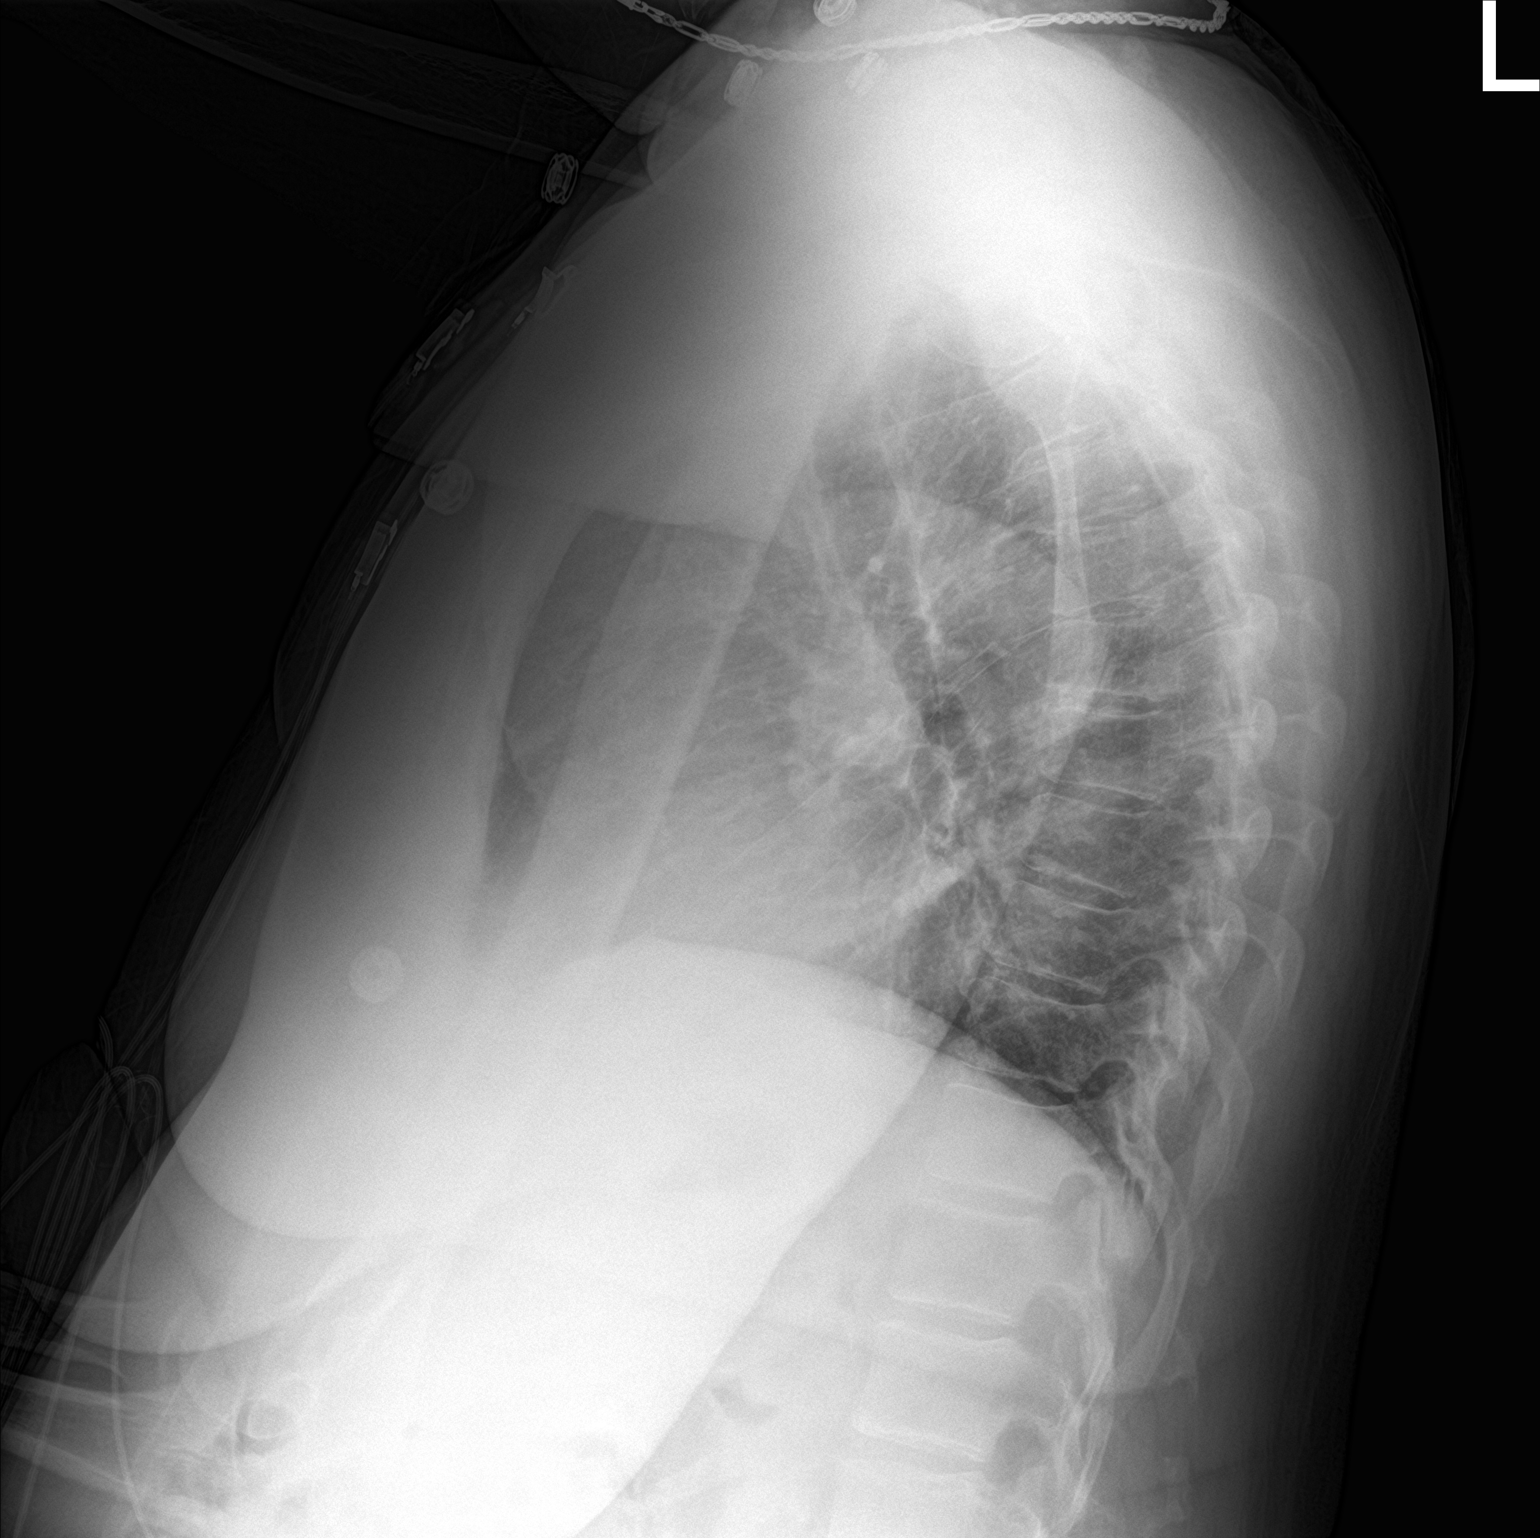

[2 of 2 positions shown; findings below may reference images not displayed]

FINDINGS: Cardiac shadow is within normal limits. Lungs are well aerated
bilaterally. No focal infiltrate or sizable effusion is seen. No
acute bony abnormality is noted.
IMPRESSION: No active cardiopulmonary disease.

## 2017-04-20 MED ORDER — TECHNETIUM TC 99M TETROFOSMIN IV KIT
10.0000 | PACK | Freq: Once | INTRAVENOUS | Status: AC | PRN
  Administered 2017-04-20: 10 via INTRAVENOUS

## 2017-04-20 MED ORDER — SODIUM CHLORIDE 0.9 % IV SOLN: 250.0000 mL | INTRAVENOUS | Status: DC | PRN

## 2017-04-20 MED ORDER — PANTOPRAZOLE SODIUM 40 MG IV SOLR
40.0000 mg | INTRAVENOUS | Status: DC
  Administered 2017-04-20 – 2017-04-21 (×2): 40 mg via INTRAVENOUS
  Filled 2017-04-20 (×2): qty 40

## 2017-04-20 MED ORDER — HYDROCHLOROTHIAZIDE 25 MG PO TABS
25.0000 mg | ORAL_TABLET | Freq: Every day | ORAL | Status: DC
  Administered 2017-04-20 – 2017-04-21 (×2): 25 mg via ORAL
  Filled 2017-04-20 (×2): qty 1

## 2017-04-20 MED ORDER — SODIUM CHLORIDE 0.9 % WEIGHT BASED INFUSION
3.0000 mL/kg/h | INTRAVENOUS | Status: DC
  Administered 2017-04-21: 3 mL/kg/h via INTRAVENOUS

## 2017-04-20 MED ORDER — REGADENOSON 0.4 MG/5ML IV SOLN
0.4000 mg | Freq: Once | INTRAVENOUS | Status: AC
  Administered 2017-04-20: 0.4 mg via INTRAVENOUS
  Filled 2017-04-20: qty 5

## 2017-04-20 MED ORDER — TECHNETIUM TC 99M TETROFOSMIN IV KIT
30.0000 | PACK | Freq: Once | INTRAVENOUS | Status: AC | PRN
  Administered 2017-04-20: 30 via INTRAVENOUS

## 2017-04-20 MED ORDER — ASPIRIN 81 MG PO CHEW
81.0000 mg | CHEWABLE_TABLET | ORAL | Status: AC
  Administered 2017-04-21: 81 mg via ORAL
  Filled 2017-04-20: qty 1

## 2017-04-20 MED ORDER — REGADENOSON 0.4 MG/5ML IV SOLN
INTRAVENOUS | Status: AC
  Administered 2017-04-20: 0.4 mg via INTRAVENOUS
  Filled 2017-04-20: qty 5

## 2017-04-20 MED ORDER — SODIUM CHLORIDE 0.9 % WEIGHT BASED INFUSION: 1.0000 mL/kg/h | INTRAVENOUS | Status: DC

## 2017-04-20 MED ORDER — ASPIRIN 81 MG PO CHEW: 81.0000 mg | CHEWABLE_TABLET | Freq: Every day | ORAL | Status: DC

## 2017-04-20 MED ORDER — SODIUM CHLORIDE 0.9% FLUSH
3.0000 mL | Freq: Two times a day (BID) | INTRAVENOUS | Status: DC
  Administered 2017-04-20 – 2017-04-21 (×2): 3 mL via INTRAVENOUS

## 2017-04-20 MED ORDER — POTASSIUM CHLORIDE CRYS ER 10 MEQ PO TBCR
20.0000 meq | EXTENDED_RELEASE_TABLET | Freq: Every day | ORAL | Status: DC
  Administered 2017-04-20 – 2017-04-21 (×2): 20 meq via ORAL
  Filled 2017-04-20 (×2): qty 2

## 2017-04-20 MED ORDER — SODIUM CHLORIDE 0.9% FLUSH: 3.0000 mL | INTRAVENOUS | Status: DC | PRN

## 2017-04-20 NOTE — Progress Notes (Signed)
   Nuclear stress test showed:   Large size, moderate intensity reversible (SDS 6) distal anterior, apical, septal and inferoseptal perfusion defect consistent with ischemia. LVEF 67% with normal wall motion. This is a high risk study.  I have discussed the results with Dr. Meda Coffee who recommends cardiac cath. I have spoken with the patient and provided the results. I have discussed recommendation for cardiac catheterization and answered her questions. The patient understands that risks included but are not limited to stroke (1 in 1000), death (1 in 16), kidney failure [usually temporary] (1 in 500), bleeding (1 in 200), allergic reaction [possibly serious] (1 in 200).   She agrees to proceed. She has been scheduled for tomorrow at 11:00 with Dr. Gwenlyn Found.  Daune Perch, AGNP-C 04/20/2017  6:00 PM Pager: [phone]

## 2017-04-20 NOTE — Progress Notes (Signed)
  Echocardiogram 2D Echocardiogram has been performed.  Diane [name] 04/20/2017, 4:32 PM

## 2017-04-20 NOTE — Care Management Obs Status (Signed)
[facility] NOTIFICATION   Patient Details  Name: MACKENA PLUMMER MRN: [id number] Date of Birth: 1946/07/28   Medicare Observation Status Notification Given:  Yes    Bethena Roys, RN 04/20/2017, 5:16 PM

## 2017-04-20 NOTE — Progress Notes (Signed)
PROGRESS NOTE    Diane [name]  ZOX:[id number] DOB: 06/09/1946 DOA: 04/19/2017 PCP: Jani Gravel, MD   Outpatient Specialists:     Brief Narrative:  Diane [name] is a 71 y.o. female with medical history significant of HTN, DM type II, heart murmur, arthritis, and GERD; who presents with a 2 day history of intermittent chest pain. Patient reports having intermittent left sternal chest tightness and pressure that is sometimes described as sharp. Denies radiation of pain and rates it a 5 out of 10. Symptoms last usually less than 5 minutes and had normally occurred while the patient is laying down. Patient had tried to treat symptoms with Alka-Seltzer with some mild relief as she thought symptoms  were related to gas pains.  Also reports taking Aleve for  The pains at night. Since the surgery she reports sleep on 3 pillows because when she is lying down it feels like she is choking. Associated symptoms include shortness of breath and leg swelling. Denies having any lightheadedness, sweats, nausea, vomiting, abdominal pain, cough, or recent travel. Patient reports having a negative stress test previously back in 2014. Last echocardiogram was in 06/2014 with EF of 55-60%.   Assessment & Plan:   Principal Problem:   Chest pain Active Problems:   Hypertension   Diabetes (HCC)   Prolonged Q-T interval on ECG   Pulmonary edema   Chest pain -high risk stress test -echo pending -await cards recommendations  HTN -continue home meds  DM -last HgbA1C: 6.2  GERD protonix    DVT prophylaxis:  Lovenox   Code Status: Full Code   Family Communication:   Disposition Plan:     Consultants:   cards     Subjective: Had some chest pain when tech was doing echo  Objective: Vitals:   04/20/17 1235 04/20/17 1305 04/20/17 1307 04/20/17 1314  BP: (!) 143/85 (!) 150/88 (!) 155/71   Pulse: 80 88 91 85  Resp:      Temp:      TempSrc:      SpO2:      Weight:      Height:         Intake/Output Summary (Last 24 hours) at 04/20/17 1542 Last data filed at 04/20/17 1100  Gross per 24 hour  Intake                0 ml  Output             2000 ml  Net            -2000 ml   Filed Weights   04/19/17 2115 04/20/17 0508  Weight: 106.6 kg (235 lb) 106.6 kg (235 lb)    Examination:  General exam: Appears calm and comfortable  Central nervous system: Alert and oriented. No focal neurological deficits. Extremities: Symmetric 5 x 5 power. Skin: No rashes, lesions or ulcers Psychiatry: Judgement and insight appear normal. Mood & affect appropriate.     Data Reviewed: I have personally reviewed following labs and imaging studies  CBC:  Recent Labs Lab 04/19/17 1348 04/20/17 0207  WBC 6.3 6.5  NEUTROABS  --  3.7  HGB 12.9 12.4  HCT 39.8 38.6  MCV 89.6 89.6  PLT 247 811   Basic Metabolic Panel:  Recent Labs Lab 04/19/17 1348 04/20/17 0207  NA 140 140  K 3.9 3.6  CL 108 105  CO2 24 26  GLUCOSE 106* 127*  BUN 11 9  CREATININE 0.67 0.53  CALCIUM  9.2 8.7*   GFR: Estimated Creatinine Clearance: 83.7 mL/min (by C-G formula based on SCr of 0.53 mg/dL). Liver Function Tests: No results for input(s): AST, ALT, ALKPHOS, BILITOT, PROT, ALBUMIN in the last 168 hours. No results for input(s): LIPASE, AMYLASE in the last 168 hours. No results for input(s): AMMONIA in the last 168 hours. Coagulation Profile: No results for input(s): INR, PROTIME in the last 168 hours. Cardiac Enzymes:  Recent Labs Lab 04/19/17 2203 04/20/17 0207  TROPONINI <0.03 <0.03   BNP (last 3 results) No results for input(s): PROBNP in the last 8760 hours. HbA1C: No results for input(s): HGBA1C in the last 72 hours. CBG:  Recent Labs Lab 04/19/17 2143 04/20/17 0722  GLUCAP 159* 136*   Lipid Profile:  Recent Labs  04/20/17 0207  CHOL 160  HDL 39*  LDLCALC 89  TRIG 160*  CHOLHDL 4.1   Thyroid Function Tests: No results for input(s): TSH, T4TOTAL, FREET4,  T3FREE, THYROIDAB in the last 72 hours. Anemia Panel: No results for input(s): VITAMINB12, FOLATE, FERRITIN, TIBC, IRON, RETICCTPCT in the last 72 hours. Urine analysis:    Component Value Date/Time   COLORURINE YELLOW 06/15/2014 0524   APPEARANCEUR CLEAR 06/15/2014 0524   LABSPEC 1.015 06/15/2014 0524   PHURINE 6.0 06/15/2014 0524   GLUCOSEU NEGATIVE 06/15/2014 0524   HGBUR NEGATIVE 06/15/2014 0524   BILIRUBINUR NEGATIVE 06/15/2014 0524   KETONESUR NEGATIVE 06/15/2014 0524   PROTEINUR NEGATIVE 06/15/2014 0524   UROBILINOGEN 0.2 06/15/2014 0524   NITRITE NEGATIVE 06/15/2014 0524   LEUKOCYTESUR NEGATIVE 06/15/2014 0524     )No results found for this or any previous visit (from the past 240 hour(s)).    Anti-infectives    Start     Dose/Rate Route Frequency Ordered Stop   04/19/17 2200  hydroxychloroquine (PLAQUENIL) tablet 200 mg    Comments:  WITH FOOD OR MILK     200 mg Oral 2 times daily 04/19/17 2020         Radiology Studies: Dg Chest 2 View  Result Date: 04/20/2017 CLINICAL DATA:  Shortness of Breath EXAM: CHEST  2 VIEW COMPARISON:  04/19/2017 FINDINGS: Cardiac shadow is within normal limits. Lungs are well aerated bilaterally. No focal infiltrate or sizable effusion is seen. No acute bony abnormality is noted. IMPRESSION: No active cardiopulmonary disease. Electronically Signed   By: Inez Catalina M.D.   On: 04/20/2017 07:38   Dg Chest 2 View  Result Date: 04/19/2017 CLINICAL DATA:  71 year old female with left chest pain, pain under the left breast for 2 days. Shortness of breath. Recent left shoulder surgery. EXAM: CHEST  2 VIEW COMPARISON:  Chest radiographs 10/26/2015 and earlier. FINDINGS: Stable lung volumes. Stable mild cardiomegaly. Other mediastinal contours are within normal limits. Visualized tracheal air column is within normal limits. No pneumothorax, pleural effusion or confluent pulmonary opacity. Mildly increased pulmonary interstitial markings today  appear related to increased vascularity. No pleural fluid identified. No acute osseous abnormality identified. Paucity of bowel gas in the upper abdomen. IMPRESSION: 1. Mild increased pulmonary interstitial opacity. Top differential considerations include mild/developing interstitial edema, chronic pulmonary interstitial disease increase since 2016, and less likely acute viral/atypical respiratory infection. 2. No other acute cardiopulmonary abnormality. Electronically Signed   By: Genevie Ann M.D.   On: 04/19/2017 15:19   Nm Myocar Multi W/spect W/wall Motion / Ef  Result Date: 04/20/2017  There was no ST segment deviation noted during stress.  Defect 1: There is a large defect of moderate severity.  Findings  consistent with ischemia.  Nuclear stress EF: 67%.  Large size, moderate intensity reversible (SDS 6) distal anterior, apical, septal and inferoseptal perfusion defect consistent with ischemia. LVEF 67% with normal wall motion. This is a high risk study.        Scheduled Meds: . amLODipine  5 mg Oral Daily   And  . irbesartan  300 mg Oral Daily  . aspirin  81 mg Oral Daily  . enoxaparin (LOVENOX) injection  40 mg Subcutaneous Q24H  . gabapentin  300 mg Oral BID  . hydrochlorothiazide  25 mg Oral Daily  . hydroxychloroquine  200 mg Oral BID  . insulin aspart  0-5 Units Subcutaneous QHS  . insulin aspart  0-9 Units Subcutaneous TID WC  . pantoprazole (PROTONIX) IV  40 mg Intravenous Q24H  . pneumococcal 23 valent vaccine  0.5 mL Intramuscular Tomorrow-1000  . potassium chloride  20 mEq Oral Daily   Continuous Infusions:   LOS: 0 days    Time spent: 25 min    [place], DO [facility] Pager [phone]  If 7PM-7AM, please contact night-coverage [email or web address] Password [facility] 04/20/2017, 3:42 PM
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
## 2017-04-20 NOTE — Progress Notes (Signed)
    Patient presented for Lexiscan nuclear stress test. Tolerated procedure well. Pending final stress imaging result.  Daune Perch, AGNP-C 04/20/2017  1:17 PM Pager: [phone]

## 2017-04-20 NOTE — Care Management Note (Signed)
Case Management Note  Patient Details  Name: Diane [name] MRN: [id number] Date of Birth: 18-Aug-1946  Subjective/Objective: Pt presented for Chest Pain- plan for cardiac cath 04-21-17. Pt is from home with husband. Plan will be to return home once stable.                    Action/Plan: No home needs identified at this time.   Expected Discharge Date:                  Expected Discharge Plan:  Home/Self Care  In-House Referral:  NA  Discharge planning Services  CM Consult  Post Acute Care Choice:  NA Choice offered to:  NA  DME Arranged:  N/A DME Agency:  NA  HH Arranged:  NA HH Agency:  NA  Status of Service:  Completed, signed off  If discussed at [place] of Stay Meetings, dates discussed:    Additional Comments:  Bethena Roys, RN 04/20/2017, 5:17 PM

## 2017-04-21 ENCOUNTER — Encounter (HOSPITAL_COMMUNITY)
Admission: EM | Disposition: A | Discharge status: Home / Self Care | Payer: Self-pay | Source: Home / Self Care | Attending: Emergency Medicine

## 2017-04-21 ENCOUNTER — Encounter (HOSPITAL_COMMUNITY): Payer: Self-pay | Admitting: Cardiovascular Disease

## 2017-04-21 DIAGNOSIS — I110 Hypertensive heart disease with heart failure: Secondary | ICD-10-CM | POA: Diagnosis not present

## 2017-04-21 DIAGNOSIS — R011 Cardiac murmur, unspecified: Secondary | ICD-10-CM | POA: Diagnosis not present

## 2017-04-21 DIAGNOSIS — Z6835 Body mass index (BMI) 35.0-35.9, adult: Secondary | ICD-10-CM | POA: Diagnosis not present

## 2017-04-21 DIAGNOSIS — R9431 Abnormal electrocardiogram [ECG] [EKG]: Secondary | ICD-10-CM | POA: Diagnosis not present

## 2017-04-21 DIAGNOSIS — M1990 Unspecified osteoarthritis, unspecified site: Secondary | ICD-10-CM | POA: Diagnosis not present

## 2017-04-21 DIAGNOSIS — E1140 Type 2 diabetes mellitus with diabetic neuropathy, unspecified: Secondary | ICD-10-CM | POA: Diagnosis not present

## 2017-04-21 DIAGNOSIS — E118 Type 2 diabetes mellitus with unspecified complications: Secondary | ICD-10-CM

## 2017-04-21 DIAGNOSIS — J810 Acute pulmonary edema: Secondary | ICD-10-CM

## 2017-04-21 DIAGNOSIS — I200 Unstable angina: Secondary | ICD-10-CM | POA: Diagnosis not present

## 2017-04-21 DIAGNOSIS — J811 Chronic pulmonary edema: Secondary | ICD-10-CM | POA: Diagnosis not present

## 2017-04-21 DIAGNOSIS — I4581 Long QT syndrome: Secondary | ICD-10-CM | POA: Diagnosis not present

## 2017-04-21 DIAGNOSIS — I509 Heart failure, unspecified: Secondary | ICD-10-CM | POA: Diagnosis not present

## 2017-04-21 DIAGNOSIS — I5031 Acute diastolic (congestive) heart failure: Secondary | ICD-10-CM

## 2017-04-21 DIAGNOSIS — R0789 Other chest pain: Secondary | ICD-10-CM | POA: Diagnosis not present

## 2017-04-21 DIAGNOSIS — E669 Obesity, unspecified: Secondary | ICD-10-CM | POA: Diagnosis not present

## 2017-04-21 DIAGNOSIS — E784 Other hyperlipidemia: Secondary | ICD-10-CM | POA: Diagnosis not present

## 2017-04-21 DIAGNOSIS — I10 Essential (primary) hypertension: Secondary | ICD-10-CM | POA: Diagnosis not present

## 2017-04-21 DIAGNOSIS — R21 Rash and other nonspecific skin eruption: Secondary | ICD-10-CM | POA: Diagnosis not present

## 2017-04-21 HISTORY — PX: LEFT HEART CATH AND CORONARY ANGIOGRAPHY: CATH118249

## 2017-04-21 LAB — GLUCOSE, CAPILLARY
GLUCOSE-CAPILLARY: 121 mg/dL — AB (ref 65–99)
GLUCOSE-CAPILLARY: 120 mg/dL — AB (ref 65–99)

## 2017-04-21 SURGERY — LEFT HEART CATH AND CORONARY ANGIOGRAPHY
Anesthesia: LOCAL

## 2017-04-21 MED ORDER — SODIUM CHLORIDE 0.9% FLUSH: 3.0000 mL | INTRAVENOUS | Status: DC | PRN

## 2017-04-21 MED ORDER — HEPARIN (PORCINE) IN NACL 2-0.9 UNIT/ML-% IJ SOLN
INTRAMUSCULAR | Status: AC
  Filled 2017-04-21: qty 1000

## 2017-04-21 MED ORDER — ACETAMINOPHEN 325 MG PO TABS: 650.0000 mg | ORAL_TABLET | ORAL | Status: DC | PRN

## 2017-04-21 MED ORDER — FENTANYL CITRATE (PF) 100 MCG/2ML IJ SOLN
INTRAMUSCULAR | Status: DC | PRN
  Administered 2017-04-21 (×2): 25 ug via INTRAVENOUS

## 2017-04-21 MED ORDER — LIDOCAINE HCL (PF) 1 % IJ SOLN
INTRAMUSCULAR | Status: DC | PRN
  Administered 2017-04-21: 2 mL

## 2017-04-21 MED ORDER — IOPAMIDOL (ISOVUE-370) INJECTION 76%
INTRAVENOUS | Status: AC
  Filled 2017-04-21: qty 100

## 2017-04-21 MED ORDER — FENTANYL CITRATE (PF) 100 MCG/2ML IJ SOLN
INTRAMUSCULAR | Status: AC
  Filled 2017-04-21: qty 2

## 2017-04-21 MED ORDER — HEPARIN SODIUM (PORCINE) 1000 UNIT/ML IJ SOLN
INTRAMUSCULAR | Status: AC
  Filled 2017-04-21: qty 1

## 2017-04-21 MED ORDER — SODIUM CHLORIDE 0.9 % IV SOLN: 250.0000 mL | INTRAVENOUS | Status: DC | PRN

## 2017-04-21 MED ORDER — ONDANSETRON HCL 4 MG/2ML IJ SOLN: 4.0000 mg | Freq: Four times a day (QID) | INTRAMUSCULAR | Status: DC | PRN

## 2017-04-21 MED ORDER — PANTOPRAZOLE SODIUM 40 MG PO TBEC: 40.0000 mg | DELAYED_RELEASE_TABLET | Freq: Every day | ORAL | 0 refills | Status: DC

## 2017-04-21 MED ORDER — POTASSIUM CHLORIDE CRYS ER 20 MEQ PO TBCR: 20.0000 meq | EXTENDED_RELEASE_TABLET | Freq: Every day | ORAL | 0 refills | Status: DC

## 2017-04-21 MED ORDER — HEPARIN (PORCINE) IN NACL 2-0.9 UNIT/ML-% IJ SOLN
INTRAMUSCULAR | Status: AC | PRN
  Administered 2017-04-21: 1000 mL

## 2017-04-21 MED ORDER — NITROGLYCERIN 1 MG/10 ML FOR IR/CATH LAB
INTRA_ARTERIAL | Status: AC
  Filled 2017-04-21: qty 10

## 2017-04-21 MED ORDER — IOPAMIDOL (ISOVUE-370) INJECTION 76%
INTRAVENOUS | Status: DC | PRN
  Administered 2017-04-21: 75 mL via INTRA_ARTERIAL

## 2017-04-21 MED ORDER — LIDOCAINE HCL (PF) 1 % IJ SOLN
INTRAMUSCULAR | Status: AC
  Filled 2017-04-21: qty 30

## 2017-04-21 MED ORDER — MIDAZOLAM HCL 2 MG/2ML IJ SOLN
INTRAMUSCULAR | Status: DC | PRN
  Administered 2017-04-21: 1 mg via INTRAVENOUS

## 2017-04-21 MED ORDER — VERAPAMIL HCL 2.5 MG/ML IV SOLN
INTRAVENOUS | Status: AC
  Filled 2017-04-21: qty 2

## 2017-04-21 MED ORDER — HEPARIN SODIUM (PORCINE) 1000 UNIT/ML IJ SOLN
INTRAMUSCULAR | Status: DC | PRN
  Administered 2017-04-21: 5000 [IU] via INTRAVENOUS

## 2017-04-21 MED ORDER — VERAPAMIL HCL 2.5 MG/ML IV SOLN
INTRA_ARTERIAL | Status: DC | PRN
  Administered 2017-04-21: 15 mL via INTRA_ARTERIAL

## 2017-04-21 MED ORDER — SODIUM CHLORIDE 0.9 % IV SOLN: INTRAVENOUS | Status: AC

## 2017-04-21 MED ORDER — SODIUM CHLORIDE 0.9% FLUSH: 3.0000 mL | Freq: Two times a day (BID) | INTRAVENOUS | Status: DC

## 2017-04-21 MED ORDER — ASPIRIN 81 MG PO CHEW: 81.0000 mg | CHEWABLE_TABLET | Freq: Every day | ORAL | Status: DC

## 2017-04-21 MED ORDER — PANTOPRAZOLE SODIUM 40 MG PO TBEC: 40.0000 mg | DELAYED_RELEASE_TABLET | Freq: Every day | ORAL | Status: DC

## 2017-04-21 MED ORDER — MIDAZOLAM HCL 2 MG/2ML IJ SOLN
INTRAMUSCULAR | Status: AC
  Filled 2017-04-21: qty 2

## 2017-04-21 MED ORDER — HYDROCHLOROTHIAZIDE 25 MG PO TABS: 25.0000 mg | ORAL_TABLET | Freq: Every day | ORAL | 0 refills | Status: DC

## 2017-04-21 SURGICAL SUPPLY — 14 items
CATH ANGIO 5F PIGTAIL 100CM (CATHETERS) IMPLANT
CATH INFINITI 5FR ANG PIGTAIL (CATHETERS) ×1 IMPLANT
CATH INFINITI JR4 5F (CATHETERS) ×1 IMPLANT
CATH OPTITORQUE TIG 4.0 5F (CATHETERS) ×1 IMPLANT
DEVICE RAD COMP TR BAND LRG (VASCULAR PRODUCTS) ×1 IMPLANT
GLIDESHEATH SLEND A-KIT 6F 22G (SHEATH) ×1 IMPLANT
GUIDEWIRE INQWIRE 1.5J.035X260 (WIRE) IMPLANT
INQWIRE 1.5J .035X260CM (WIRE) ×4
KIT HEART LEFT (KITS) ×2 IMPLANT
PACK CARDIAC CATHETERIZATION (CUSTOM PROCEDURE TRAY) ×2 IMPLANT
SYR MEDRAD MARK V 150ML (SYRINGE) ×2 IMPLANT
TRANSDUCER W/STOPCOCK (MISCELLANEOUS) ×2 IMPLANT
TUBING CIL FLEX 10 FLL-RA (TUBING) ×2 IMPLANT
WIRE HITORQ VERSACORE ST 145CM (WIRE) ×1 IMPLANT

## 2017-04-21 NOTE — Discharge Instructions (Addendum)
Radial Site Care Refer to this sheet in the next few weeks. These instructions provide you with information about caring for yourself after your procedure. Your health care provider may also give you more specific instructions. Your treatment has been planned according to current medical practices, but problems sometimes occur. Call your health care provider if you have any problems or questions after your procedure. What can I expect after the procedure? After your procedure, it is typical to have the following:  Bruising at the radial site that usually fades within 1-2 weeks.  Blood collecting in the tissue (hematoma) that may be painful to the touch. It should usually decrease in size and tenderness within 1-2 weeks.  Follow these instructions at home:  Take medicines only as directed by your health care provider.  You may shower 24-48 hours after the procedure or as directed by your health care provider. Remove the bandage (dressing) and gently wash the site with plain soap and water. Pat the area dry with a clean towel. Do not rub the site, because this may cause bleeding.  Do not take baths, swim, or use a hot tub until your health care provider approves.  Check your insertion site every day for redness, swelling, or drainage.  Do not apply powder or lotion to the site.  Do not flex or bend the affected arm for 24 hours or as directed by your health care provider.  Do not push or pull heavy objects with the affected arm for 24 hours or as directed by your health care provider.  Do not lift over 10 lb (4.5 kg) for 5 days after your procedure or as directed by your health care provider.  Ask your health care provider when it is okay to: ? Return to work or school. ? Resume usual physical activities or sports. ? Resume sexual activity.  Do not drive home if you are discharged the same day as the procedure. Have someone else drive you.  You may drive 24 hours after the procedure  unless otherwise instructed by your health care provider.  Do not operate machinery or power tools for 24 hours after the procedure.  If your procedure was done as an outpatient procedure, which means that you went home the same day as your procedure, a responsible adult should be with you for the first 24 hours after you arrive home.  Keep all follow-up visits as directed by your health care provider. This is important. Contact a health care provider if:  You have a fever.  You have chills.  You have increased bleeding from the radial site. Hold pressure on the site. Get help right away if:  You have unusual pain at the radial site.  You have redness, warmth, or swelling at the radial site.  You have drainage (other than a small amount of blood on the dressing) from the radial site.  The radial site is bleeding, and the bleeding does not stop after 30 minutes of holding steady pressure on the site.  Your arm or hand becomes pale, cool, tingly, or numb. This information is not intended to replace advice given to you by your health care provider. Make sure you discuss any questions you have with your health care provider. Document Released: 11/20/2010 Document Revised: 03/25/2016 Document Reviewed: 05/06/2014 Elsevier Interactive Patient Education  2018 [place] TO BRING ALL OF YOUR MEDICATIONS TO EACH OF YOUR FOLLOW-UP OFFICE VISITS.  PLEASE ATTEND ALL SCHEDULED FOLLOW-UP APPOINTMENTS.   Activity: Increase  activity slowly as tolerated. You may shower, but no soaking baths (or swimming) for 1 week. No driving for 24 hours. No lifting over 5 lbs for 1 week. No sexual activity for 1 week.   You May Return to Work: in 1 week (if applicable)  Wound Care: You may wash cath site gently with soap and water. Keep cath site clean and dry. If you notice pain, swelling, bleeding or pus at your cath site, please call [phone].

## 2017-04-21 NOTE — Interval H&P Note (Signed)
Cath Lab Visit (complete for each Cath Lab visit)  Clinical Evaluation Leading to the Procedure:   ACS: Yes.    Non-ACS:    Anginal Classification: CCS III  Anti-ischemic medical therapy: Minimal Therapy (1 class of medications)  Non-Invasive Test Results: Intermediate-risk stress test findings: cardiac mortality 1-3%/year  Prior CABG: No previous CABG      History and Physical Interval Note:  04/21/2017 11:09 AM  Diane [name]  has presented today for surgery, with the diagnosis of positive stress - cp  The various methods of treatment have been discussed with the patient and family. After consideration of risks, benefits and other options for treatment, the patient has consented to  Procedure(s): Left Heart Cath and Coronary Angiography (N/A) as a surgical intervention .  The patient's history has been reviewed, patient examined, no change in status, stable for surgery.  I have reviewed the patient's chart and labs.  Questions were answered to the patient's satisfaction.     Quay Burow

## 2017-04-21 NOTE — Plan of Care (Signed)
Problem: Education: Goal: Knowledge of [facility] Education information/materials will improve Outcome: Progressing Patient aware of plan of care.  RN provided medication education to patient on all medications administered thus far this shift.  Patient stated understanding.     

## 2017-04-21 NOTE — Progress Notes (Addendum)
Progress Note  [name] Date of Encounter: 04/21/2017  Primary Cardiologist: new patient  Subjective   The patient feels better today, denies chest pain.  Inpatient Medications    Scheduled Meds: . amLODipine  5 mg Oral Daily   And  . irbesartan  300 mg Oral Daily  . [START ON 04/22/2017] aspirin  81 mg Oral Daily  . enoxaparin (LOVENOX) injection  40 mg Subcutaneous Q24H  . gabapentin  300 mg Oral BID  . hydrochlorothiazide  25 mg Oral Daily  . hydroxychloroquine  200 mg Oral BID  . insulin aspart  0-5 Units Subcutaneous QHS  . insulin aspart  0-9 Units Subcutaneous TID WC  . pantoprazole (PROTONIX) IV  40 mg Intravenous Q24H  . pneumococcal 23 valent vaccine  0.5 mL Intramuscular Tomorrow-1000  . potassium chloride  20 mEq Oral Daily   Continuous Infusions:  PRN Meds: acetaminophen, gi cocktail, loratadine, morphine injection, naphazoline-glycerin, ondansetron (ZOFRAN) IV   Vital Signs    Vitals:   04/21/17 1138 04/21/17 1143 04/21/17 1148 04/21/17 1153  BP: 138/81 (!) 145/79 135/79 135/79  Pulse: 83 87 85 80  Resp: 11 18 (!) 46 (!) 23  Temp:      TempSrc:      SpO2: 98% 99% 100% 100%  Weight:      Height:        Intake/Output Summary (Last 24 hours) at 04/21/17 1216 Last data filed at 04/21/17 0900  Gross per 24 hour  Intake              483 ml  Output              600 ml  Net             -117 ml   Filed Weights   04/19/17 2115 04/20/17 0508 04/21/17 0408  Weight: 235 lb (106.6 kg) 235 lb (106.6 kg) 229 lb 12.8 oz (104.2 kg)    Telemetry    SR - Personally Reviewed  ECG    SR - Personally Reviewed  Physical Exam   GEN: No acute distress.   Neck: No JVD Cardiac: RRR, 2/6 murmurs, rubs, or gallops.  Respiratory: Clear to auscultation bilaterally. GI: Soft, nontender, non-distended  MS: No edema; No deformity. Neuro:  Nonfocal  Psych: Normal affect   Labs    Chemistry Recent Labs Lab 04/19/17 1348 04/20/17 0207    NA 140 140  K 3.9 3.6  CL 108 105  CO2 24 26  GLUCOSE 106* 127*  BUN 11 9  CREATININE 0.67 0.53  CALCIUM 9.2 8.7*  GFRNONAA >60 >60  GFRAA >60 >60  ANIONGAP 8 9     Hematology Recent Labs Lab 04/19/17 1348 04/20/17 0207  WBC 6.3 6.5  RBC 4.44 4.31  HGB 12.9 12.4  HCT 39.8 38.6  MCV 89.6 89.6  MCH 29.1 28.8  MCHC 32.4 32.1  RDW 14.5 14.4  PLT 247 233    Cardiac Enzymes Recent Labs Lab 04/19/17 2203 04/20/17 0207  TROPONINI <0.03 <0.03    Recent Labs Lab 04/19/17 1410  TROPIPOC 0.00     BNP Recent Labs Lab 04/19/17 1404  BNP 49.7     DDimer No results for input(s): DDIMER in the last 168 hours.   Radiology    Dg Chest 2 View  Result Date: 04/20/2017 CLINICAL DATA:  Shortness of Breath EXAM: CHEST  2 VIEW COMPARISON:  04/19/2017 FINDINGS: Cardiac shadow is within normal limits. Lungs are  well aerated bilaterally. No focal infiltrate or sizable effusion is seen. No acute bony abnormality is noted. IMPRESSION: No active cardiopulmonary disease. Electronically Signed   By: Inez Catalina M.D.   On: 04/20/2017 07:38   Dg Chest 2 View  Result Date: 04/19/2017 CLINICAL DATA:  71 year old female with left chest pain, pain under the left breast for 2 days. Shortness of breath. Recent left shoulder surgery. EXAM: CHEST  2 VIEW COMPARISON:  Chest radiographs 10/26/2015 and earlier. FINDINGS: Stable lung volumes. Stable mild cardiomegaly. Other mediastinal contours are within normal limits. Visualized tracheal air column is within normal limits. No pneumothorax, pleural effusion or confluent pulmonary opacity. Mildly increased pulmonary interstitial markings today appear related to increased vascularity. No pleural fluid identified. No acute osseous abnormality identified. Paucity of bowel gas in the upper abdomen. IMPRESSION: 1. Mild increased pulmonary interstitial opacity. Top differential considerations include mild/developing interstitial edema, chronic pulmonary  interstitial disease increase since 2016, and less likely acute viral/atypical respiratory infection. 2. No other acute cardiopulmonary abnormality. Electronically Signed   By: Genevie Ann M.D.   On: 04/19/2017 15:19   Nm Myocar Multi W/spect W/wall Motion / Ef  Result Date: 04/20/2017  There was no ST segment deviation noted during stress.  Defect 1: There is a large defect of moderate severity.  Findings consistent with ischemia.  Nuclear stress EF: 67%.  Large size, moderate intensity reversible (SDS 6) distal anterior, apical, septal and inferoseptal perfusion defect consistent with ischemia. LVEF 67% with normal wall motion. This is a high risk study.   TTE: 04/20/2017 - Left ventricle: The cavity size was normal. Wall thickness was   normal. Systolic function was normal. The estimated ejection   fraction was in the range of 60% to 65%. Wall motion was normal;   there were no regional wall motion abnormalities. Doppler   parameters are consistent with abnormal left ventricular   relaxation (grade 1 diastolic dysfunction). - Aortic valve: Mildly calcified annulus. Moderately thickened,   mildly calcified leaflets. There was very mild stenosis. Valve   area (VTI): 2.3 cm^2. Valve area (Vmax): 2.09 cm^2. Valve area   (Vmean): 2.33 cm^2.   Patient Profile     71 y.o. female with chest pain  Assessment & Plan    1. Chest pain 2. Hypertensive heart disease with CHF 3. Diabetes 4. Snoring 5. Murmur  71 y.o. female with a hx of Hypertension, diabetes type 2, heart murmur, arthritis, and GERD who is being seen today for the evaluation of chest pain. The pain is non-exertional left sided pressure like, wakes her up at night. There is mild associated increase in shortness of breath but no nausea, lightheadedness, palpitations. She has had negatively positive stress test in 2014 with normal left heart cath in [place] in 2014.  The patient has a history of GERD and felt like this was indigestion  or gas so she was taking Alka-Seltzer but without relief. She had normal EGD and colonoscopy a year ago. She is on no PPIs. Troponin negative x 3, ECG: NSR, nonspecific ST T wave abnormalities.  PE: systolic murmur 2/6, mild B/L LE edema.   A Lexiscan nuclear stress test yesterday was positive and she underwent cardiac catheterization today that was normal. BP uncontrolled, with added HCTZ 25 mg po daily, she has QT 444 ms, QTc 505 ms, however no QT prolonging drug, I will add KCl 10 mEq daily. I will repeat EKG today, unless prolongation in her QT we'll continue the same medical management,  she can be discharged home, her blood pressure is now controlled, we will arrange for outpatient follow-up.  Signed, Ena Dawley, MD  04/21/2017, 12:16 PM    Follow up ECG done to day shows shortened QT interval, the patient can be discharged on the current medication regimen.  Ena Dawley, MD 04/21/2017

## 2017-04-21 NOTE — Progress Notes (Signed)
Called to room by family member, pt found in restroom with bleeding to right wrist, pressure held for 5 minutes with hemostasis achieved, pressure dressing applied, assisted pt back to bed without difficulty, right arm elevated on pillow, pt advised to rest, and keep arm still and elevated, will re-evaluate in 1/2 hour.  Edward Qualia RN

## 2017-04-21 NOTE — Plan of Care (Signed)
Problem: Pain Managment: Goal: General experience of comfort will improve Outcome: Progressing Patient denies pain thus far this shift.  RN instructed patient to notify RN if patient started to experience any pain.  Patient agreeable to RN's instruction.

## 2017-04-21 NOTE — Progress Notes (Signed)
   The patient had a cardiac cath today that showed normal LV systolic function and normal coronary arteries. She is OK for discharge today after her cath site is stable. I have arranged for a cardiology follow up appointment.   Daune Perch, AGNP-C 04/21/2017  12:24 PM Pager: [phone]

## 2017-04-21 NOTE — Discharge Summary (Signed)
Physician Discharge Summary  Diane [name] YQM:[id number] DOB: 03/13/1946 DOA: 04/19/2017  PCP: Jani Gravel, MD  Admit date: 04/19/2017 Discharge date: 04/21/2017   Recommendations for Outpatient Follow-Up:   1. Monitoring of Qtc 2. May need to go back and see GI Collene Mares)   Discharge Diagnosis:   Principal Problem:   Chest pain Active Problems:   Hypertension   Diabetes ([place])   Prolonged Q-T interval on ECG   Pulmonary edema   Discharge disposition:  Home.  Discharge Condition: Improved.  Diet recommendation: Low sodium, heart healthy.  Carbohydrate-modified  Wound care: None.   History of Present Illness:   Diane [name] is a 71 y.o. female with medical history significant of HTN, DM type II, heart murmur, arthritis, and GERD; who presents with a 2 day history of intermittent chest pain. Patient reports having intermittent left sternal chest tightness and pressure that is sometimes described as sharp. Denies radiation of pain and rates it a 5 out of 10. Symptoms last usually less than 5 minutes and had normally occurred while the patient is laying down. Patient had tried to treat symptoms with Alka-Seltzer with some mild relief as she thought symptoms  were related to gas pains.  Also reports taking Aleve for  The pains at night. Since the surgery she reports sleep on 3 pillows because when she is lying down it feels like she is choking. Associated symptoms include shortness of breath and leg swelling. Denies having any lightheadedness, sweats, nausea, vomiting, abdominal pain, cough, or recent travel. Patient reports having a negative stress test previously back in 2014. Last echocardiogram was in 06/2014 with EF of 55-60%.   Hospital Course by Problem:   Chest pain -non cardiac- cath: Diane [name] had normal coronary arteries and normal LV function. I believe her stress test was false positive and her chest pain noncardiac. -PPI -GI follow up if discomfort continues  HTN-  uncontrolled HCTZ added by cardiology -QT stable on repeat EKG -K being supplemented  DM -last HgbA1C was 6.2     Medical Consultants:   cards  Discharge Exam:   Vitals:   04/21/17 1148 04/21/17 1153  BP: 135/79 135/79  Pulse: 85 80  Resp: (!) 46 (!) 23  Temp:     Vitals:   04/21/17 1138 04/21/17 1143 04/21/17 1148 04/21/17 1153  BP: 138/81 (!) 145/79 135/79 135/79  Pulse: 83 87 85 80  Resp: 11 18 (!) 46 (!) 23  Temp:      TempSrc:      SpO2: 98% 99% 100% 100%  Weight:      Height:        Gen:  NAD    The results of significant diagnostics from this hospitalization (including imaging, microbiology, ancillary and laboratory) are listed below for reference.     Procedures and Diagnostic Studies:   Dg Chest 2 View  Result Date: 04/20/2017 CLINICAL DATA:  Shortness of Breath EXAM: CHEST  2 VIEW COMPARISON:  04/19/2017 FINDINGS: Cardiac shadow is within normal limits. Lungs are well aerated bilaterally. No focal infiltrate or sizable effusion is seen. No acute bony abnormality is noted. IMPRESSION: No active cardiopulmonary disease. Electronically Signed   By: Inez Catalina M.D.   On: 04/20/2017 07:38   Dg Chest 2 View  Result Date: 04/19/2017 CLINICAL DATA:  71 year old female with left chest pain, pain under the left breast for 2 days. Shortness of breath. Recent left shoulder surgery. EXAM: CHEST  2 VIEW COMPARISON:  Chest radiographs  10/26/2015 and earlier. FINDINGS: Stable lung volumes. Stable mild cardiomegaly. Other mediastinal contours are within normal limits. Visualized tracheal air column is within normal limits. No pneumothorax, pleural effusion or confluent pulmonary opacity. Mildly increased pulmonary interstitial markings today appear related to increased vascularity. No pleural fluid identified. No acute osseous abnormality identified. Paucity of bowel gas in the upper abdomen. IMPRESSION: 1. Mild increased pulmonary interstitial opacity. Top differential  considerations include mild/developing interstitial edema, chronic pulmonary interstitial disease increase since 2016, and less likely acute viral/atypical respiratory infection. 2. No other acute cardiopulmonary abnormality. Electronically Signed   By: Genevie Ann M.D.   On: 04/19/2017 15:19   Nm Myocar Multi W/spect W/wall Motion / Ef  Result Date: 04/20/2017  There was no ST segment deviation noted during stress.  Defect 1: There is a large defect of moderate severity.  Findings consistent with ischemia.  Nuclear stress EF: 67%.  Large size, moderate intensity reversible (SDS 6) distal anterior, apical, septal and inferoseptal perfusion defect consistent with ischemia. LVEF 67% with normal wall motion. This is a high risk study.     Labs:   Basic Metabolic Panel:  Recent Labs Lab 04/19/17 1348 04/20/17 0207  NA 140 140  K 3.9 3.6  CL 108 105  CO2 24 26  GLUCOSE 106* 127*  BUN 11 9  CREATININE 0.67 0.53  CALCIUM 9.2 8.7*   GFR Estimated Creatinine Clearance: 82.6 mL/min (by C-G formula based on SCr of 0.53 mg/dL). Liver Function Tests: No results for input(s): AST, ALT, ALKPHOS, BILITOT, PROT, ALBUMIN in the last 168 hours. No results for input(s): LIPASE, AMYLASE in the last 168 hours. No results for input(s): AMMONIA in the last 168 hours. Coagulation profile  Recent Labs Lab 04/20/17 1950  INR 1.13    CBC:  Recent Labs Lab 04/19/17 1348 04/20/17 0207  WBC 6.3 6.5  NEUTROABS  --  3.7  HGB 12.9 12.4  HCT 39.8 38.6  MCV 89.6 89.6  PLT 247 233   Cardiac Enzymes:  Recent Labs Lab 04/19/17 2203 04/20/17 0207  TROPONINI <0.03 <0.03   BNP: Invalid input(s): POCBNP CBG:  Recent Labs Lab 04/20/17 0722 04/20/17 1703 04/20/17 2057 04/21/17 0718 04/21/17 1321  GLUCAP 136* 144* 115* 121* 120*   D-Dimer No results for input(s): DDIMER in the last 72 hours. Hgb A1c No results for input(s): HGBA1C in the last 72 hours. Lipid Profile  Recent Labs   04/20/17 0207  CHOL 160  HDL 39*  LDLCALC 89  TRIG 160*  CHOLHDL 4.1   Thyroid function studies No results for input(s): TSH, T4TOTAL, T3FREE, THYROIDAB in the last 72 hours.  Invalid input(s): FREET3 Anemia work up No results for input(s): VITAMINB12, FOLATE, FERRITIN, TIBC, IRON, RETICCTPCT in the last 72 hours. Microbiology No results found for this or any previous visit (from the past 240 hour(s)).   Discharge Instructions:   Discharge Instructions    Diet - low sodium heart healthy    Complete by:  As directed    Diet Carb Modified    Complete by:  As directed    Increase activity slowly    Complete by:  As directed      Allergies as of 04/21/2017      Reactions   Cephalexin Other (See Comments)   URINARY RETENTION = "blocked my urine"   Codeine Rash      Medication List    STOP taking these medications   ALEVE PM 220-25 MG Tabs Generic drug:  Naproxen  Sod-Diphenhydramine   methocarbamol 500 MG tablet Commonly known as:  ROBAXIN   oxyCODONE-acetaminophen 5-325 MG tablet Commonly known as:  ROXICET     TAKE these medications   ALIVE ONCE DAILY WOMENS 50+ Tabs Take 2 tablets by mouth daily.   aspirin 81 MG chewable tablet Chew 81 mg by mouth daily.   gabapentin 300 MG capsule Commonly known as:  NEURONTIN Take 300 mg by mouth 2 (two) times daily.   hydrochlorothiazide 25 MG tablet Commonly known as:  HYDRODIURIL Take 1 tablet (25 mg total) by mouth daily. Start taking on:  04/22/2017   hydroxychloroquine 200 MG tablet Commonly known as:  PLAQUENIL Take 200 mg by mouth 2 (two) times daily. WITH FOOD OR MILK   loratadine 10 MG tablet Commonly known as:  CLARITIN Take 10 mg by mouth daily as needed for allergies.   metFORMIN 500 MG 24 hr tablet Commonly known as:  GLUCOPHAGE-XR Take 500 mg by mouth daily with breakfast.   naproxen sodium 220 MG tablet Commonly known as:  ANAPROX Take 220-440 mg by mouth 2 (two) times daily as needed (for  pain).   pantoprazole 40 MG tablet Commonly known as:  PROTONIX Take 1 tablet (40 mg total) by mouth daily. Start taking on:  04/22/2017   potassium chloride SA 20 MEQ tablet Commonly known as:  K-DUR,KLOR-CON Take 1 tablet (20 mEq total) by mouth daily. Start taking on:  04/22/2017   tetrahydrozoline 0.05 % ophthalmic solution Place 1-2 drops into both eyes 3 (three) times daily as needed (for watery/red eyes.).   TWYNSTA 80-5 MG Tabs Generic drug:  Telmisartan-Amlodipine Take 1 tablet by mouth daily.      Follow-up Information    Lyda Jester M, PA-C Follow up.   Specialties:  Cardiology, Radiology Why:  On July 16 at 9:00 for cardiology hospital follow up.  Contact information: [address] [place] [address] [phone]        Jani Gravel, MD Follow up in 1 week(s).   Specialty:  Internal Medicine Why:  monitor Qtc Contact information: [place] [address] [phone]        Juanita Craver, MD Follow up in 2 week(s).   Specialty:  Gastroenterology Why:  if discomfort continues Contact information: [address], [place] [address] [phone]            Time coordinating discharge: 25 min  Signed:  Eiden Bagot U Cate Oravec   [facility] 04/21/2017, 1:40 PM

## 2017-04-21 NOTE — H&P (View-Only) (Signed)
   Nuclear stress test showed:   Large size, moderate intensity reversible (SDS 6) distal anterior, apical, septal and inferoseptal perfusion defect consistent with ischemia. LVEF 67% with normal wall motion. This is a high risk study.  I have discussed the results with Dr. Meda Coffee who recommends cardiac cath. I have spoken with the patient and provided the results. I have discussed recommendation for cardiac catheterization and answered her questions. The patient understands that risks included but are not limited to stroke (1 in 1000), death (1 in 2), kidney failure [usually temporary] (1 in 500), bleeding (1 in 200), allergic reaction [possibly serious] (1 in 200).   She agrees to proceed. She has been scheduled for tomorrow at 11:00 with Dr. Gwenlyn Found.  Daune Perch, AGNP-C 04/20/2017  6:00 PM Pager: [phone]

## 2017-05-03 DIAGNOSIS — R079 Chest pain, unspecified: Secondary | ICD-10-CM | POA: Diagnosis not present

## 2017-05-03 DIAGNOSIS — K219 Gastro-esophageal reflux disease without esophagitis: Secondary | ICD-10-CM | POA: Diagnosis not present

## 2017-05-03 DIAGNOSIS — K5730 Diverticulosis of large intestine without perforation or abscess without bleeding: Secondary | ICD-10-CM | POA: Diagnosis not present

## 2017-05-09 DIAGNOSIS — M25511 Pain in right shoulder: Secondary | ICD-10-CM | POA: Diagnosis not present

## 2017-05-12 DIAGNOSIS — M25511 Pain in right shoulder: Secondary | ICD-10-CM | POA: Diagnosis not present

## 2017-05-16 ENCOUNTER — Encounter: Payer: Self-pay | Admitting: Cardiology

## 2017-05-16 ENCOUNTER — Ambulatory Visit (INDEPENDENT_AMBULATORY_CARE_PROVIDER_SITE_OTHER): Payer: Medicare Other | Admitting: Cardiology

## 2017-05-16 VITALS — BP 122/66 | HR 84 | Ht 68.0 in | Wt 231.8 lb

## 2017-05-16 DIAGNOSIS — Z79899 Other long term (current) drug therapy: Secondary | ICD-10-CM | POA: Diagnosis not present

## 2017-05-16 NOTE — Patient Instructions (Signed)
Medication Instructions:   Your physician recommends that you continue on your current medications as directed. Please refer to the Current Medication list given to you today.   If you need a refill on your cardiac medications before your next appointment, please call your pharmacy.  Labwork: BNP TODAY    Testing/Procedures: NONE ORDERED  TODAY    Follow-Up:  Your physician wants you to follow-up in:  [place]  [place] will receive a reminder letter in the mail two months in advance. If you don't receive a letter, please call our office to schedule the follow-up appointment.      Any Other Special Instructions Will Be Listed Below (If Applicable).
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
## 2017-05-17 DIAGNOSIS — M25511 Pain in right shoulder: Secondary | ICD-10-CM | POA: Diagnosis not present

## 2017-05-17 LAB — BASIC METABOLIC PANEL
BUN/Creatinine Ratio: 27 (ref 12–28)
BUN: 16 mg/dL (ref 8–27)
CALCIUM: 9.4 mg/dL (ref 8.7–10.3)
CO2: 24 mmol/L (ref 20–29)
Chloride: 105 mmol/L (ref 96–106)
Creatinine, Ser: 0.6 mg/dL (ref 0.57–1.00)
GFR, EST AFRICAN AMERICAN: 106 mL/min/{1.73_m2} (ref >59)
GFR, EST NON AFRICAN AMERICAN: 92 mL/min/{1.73_m2} (ref >59)
Glucose: 152 mg/dL — ABNORMAL HIGH (ref 65–99)
Potassium: 3.6 mmol/L (ref 3.5–5.2)
Sodium: 141 mmol/L (ref 134–144)

## 2017-05-19 DIAGNOSIS — M25511 Pain in right shoulder: Secondary | ICD-10-CM | POA: Diagnosis not present

## 2017-05-24 DIAGNOSIS — M25511 Pain in right shoulder: Secondary | ICD-10-CM | POA: Diagnosis not present

## 2017-05-26 DIAGNOSIS — M25511 Pain in right shoulder: Secondary | ICD-10-CM | POA: Diagnosis not present

## 2017-05-31 DIAGNOSIS — H2511 Age-related nuclear cataract, right eye: Secondary | ICD-10-CM | POA: Diagnosis not present

## 2017-05-31 DIAGNOSIS — M25511 Pain in right shoulder: Secondary | ICD-10-CM | POA: Diagnosis not present

## 2017-05-31 DIAGNOSIS — H35372 Puckering of macula, left eye: Secondary | ICD-10-CM | POA: Diagnosis not present

## 2017-06-02 DIAGNOSIS — M25511 Pain in right shoulder: Secondary | ICD-10-CM | POA: Diagnosis not present

## 2017-06-09 DIAGNOSIS — M25511 Pain in right shoulder: Secondary | ICD-10-CM | POA: Diagnosis not present

## 2017-06-13 DIAGNOSIS — H6123 Impacted cerumen, bilateral: Secondary | ICD-10-CM | POA: Diagnosis not present

## 2017-06-13 DIAGNOSIS — E1165 Type 2 diabetes mellitus with hyperglycemia: Secondary | ICD-10-CM | POA: Diagnosis not present

## 2017-06-13 DIAGNOSIS — H9202 Otalgia, left ear: Secondary | ICD-10-CM | POA: Diagnosis not present

## 2017-06-13 DIAGNOSIS — R1030 Lower abdominal pain, unspecified: Secondary | ICD-10-CM | POA: Diagnosis not present

## 2017-06-14 DIAGNOSIS — H11012 Amyloid pterygium of left eye: Secondary | ICD-10-CM | POA: Diagnosis not present

## 2017-06-14 DIAGNOSIS — H2511 Age-related nuclear cataract, right eye: Secondary | ICD-10-CM | POA: Diagnosis not present

## 2017-06-14 DIAGNOSIS — Z961 Presence of intraocular lens: Secondary | ICD-10-CM | POA: Diagnosis not present

## 2017-06-16 DIAGNOSIS — M25511 Pain in right shoulder: Secondary | ICD-10-CM | POA: Diagnosis not present

## 2017-06-21 DIAGNOSIS — M25511 Pain in right shoulder: Secondary | ICD-10-CM | POA: Diagnosis not present

## 2017-06-21 IMAGING — NM NM HEPATO W/GB/PHARM/[PERSON_NAME]
1 series · 12 of 12 positions shown · non-contrast
Comparison: none

CLINICAL DATA: Right upper abdominal pain.  Negative ultrasound.

[Series 1: hepato · 4.46mm/px · 2 acquisitions, 12 frames shown]
[im 1/2]
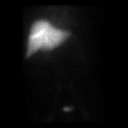
[im 1/2]
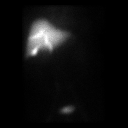
[im 1/2]
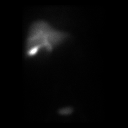
[im 1/2]
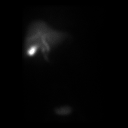
[im 1/2]
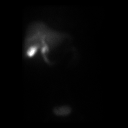
[im 1/2]
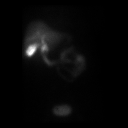
[im 2/2]
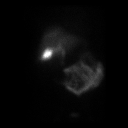
[im 2/2]
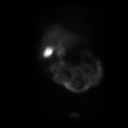
[im 2/2]
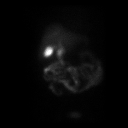
[im 2/2]
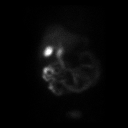
[im 2/2]
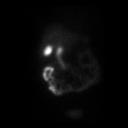
[im 2/2]
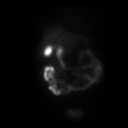

[12 of 12 positions shown; findings below may reference images not displayed]

HEPATOBILIARY SCINTIGRAPHY WITH EJECTION FRACTION

Anterior imaging after K.KmZi Qc991 Choletec IV. There is prompt
clearance of the radiopharmaceutical from the blood pool. Timely
visualization of activity in central bile ducts, small bowel, and
gallbladder.
After 1 hour, 2.0 mcg CCK was infused intravenously and imaging
continued. The patient describes no symptoms with infusion. The
calculated gallbladder ejection fraction over 30 minutes is 30.4%
(normal >30% at  thirty minutes (Ziessman et al., 6117 J. Nuclear
Med).

IMPRESSION
1. Patency of cystic and common bile ducts.
2. Borderline normal gallbladder ejection fraction.

## 2017-06-23 DIAGNOSIS — M19011 Primary osteoarthritis, right shoulder: Secondary | ICD-10-CM | POA: Diagnosis not present

## 2017-06-23 DIAGNOSIS — S46011D Strain of muscle(s) and tendon(s) of the rotator cuff of right shoulder, subsequent encounter: Secondary | ICD-10-CM | POA: Diagnosis not present

## 2017-06-23 DIAGNOSIS — Z4789 Encounter for other orthopedic aftercare: Secondary | ICD-10-CM | POA: Diagnosis not present

## 2017-06-28 DIAGNOSIS — M25511 Pain in right shoulder: Secondary | ICD-10-CM | POA: Diagnosis not present

## 2017-06-30 DIAGNOSIS — M25511 Pain in right shoulder: Secondary | ICD-10-CM | POA: Diagnosis not present

## 2017-07-05 DIAGNOSIS — M25511 Pain in right shoulder: Secondary | ICD-10-CM | POA: Diagnosis not present

## 2017-07-08 DIAGNOSIS — M25511 Pain in right shoulder: Secondary | ICD-10-CM | POA: Diagnosis not present

## 2017-07-12 DIAGNOSIS — M25511 Pain in right shoulder: Secondary | ICD-10-CM | POA: Diagnosis not present

## 2017-07-21 DIAGNOSIS — Z4789 Encounter for other orthopedic aftercare: Secondary | ICD-10-CM | POA: Diagnosis not present

## 2017-07-24 ENCOUNTER — Encounter (HOSPITAL_BASED_OUTPATIENT_CLINIC_OR_DEPARTMENT_OTHER): Payer: Self-pay | Admitting: Emergency Medicine

## 2017-07-24 ENCOUNTER — Emergency Department (HOSPITAL_BASED_OUTPATIENT_CLINIC_OR_DEPARTMENT_OTHER)
Admission: EM | Admit: 2017-07-24 | Discharge: 2017-07-24 | Disposition: A | Discharge status: Home / Self Care | Payer: Medicare Other | Attending: Emergency Medicine | Admitting: Emergency Medicine

## 2017-07-24 DIAGNOSIS — Z7984 Long term (current) use of oral hypoglycemic drugs: Secondary | ICD-10-CM | POA: Diagnosis not present

## 2017-07-24 DIAGNOSIS — I10 Essential (primary) hypertension: Secondary | ICD-10-CM | POA: Insufficient documentation

## 2017-07-24 DIAGNOSIS — H6692 Otitis media, unspecified, left ear: Secondary | ICD-10-CM | POA: Diagnosis not present

## 2017-07-24 DIAGNOSIS — Z7982 Long term (current) use of aspirin: Secondary | ICD-10-CM | POA: Diagnosis not present

## 2017-07-24 DIAGNOSIS — E119 Type 2 diabetes mellitus without complications: Secondary | ICD-10-CM | POA: Diagnosis not present

## 2017-07-24 DIAGNOSIS — H9202 Otalgia, left ear: Secondary | ICD-10-CM | POA: Diagnosis present

## 2017-07-24 MED ORDER — AMOXICILLIN-POT CLAVULANATE 875-125 MG PO TABS: 1.0000 | ORAL_TABLET | Freq: Two times a day (BID) | ORAL | 0 refills | Status: DC

## 2017-07-24 MED ORDER — AMOXICILLIN-POT CLAVULANATE 875-125 MG PO TABS
1.0000 | ORAL_TABLET | Freq: Once | ORAL | Status: AC
  Administered 2017-07-24: 1 via ORAL
  Filled 2017-07-24: qty 1

## 2017-07-24 NOTE — ED Triage Notes (Signed)
Pt c/o LT ear pain x 4 wks; sts was seen and tx by PCP initially, but pain has persisted.

## 2017-07-24 NOTE — ED Notes (Signed)
ED Provider at bedside. 
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
## 2017-07-24 NOTE — Discharge Instructions (Signed)
Take augmentin twice daily for a week.   If your symptoms are not improved, call ENT office for further evaluation   Return to ER if you have worse ear pain, purulent discharge from ear, worse neck pain or facial swelling, numbness, weakness

## 2017-08-05 DIAGNOSIS — M064 Inflammatory polyarthropathy: Secondary | ICD-10-CM | POA: Diagnosis not present

## 2017-08-05 DIAGNOSIS — R21 Rash and other nonspecific skin eruption: Secondary | ICD-10-CM | POA: Diagnosis not present

## 2017-08-05 DIAGNOSIS — M1990 Unspecified osteoarthritis, unspecified site: Secondary | ICD-10-CM | POA: Diagnosis not present

## 2017-08-05 DIAGNOSIS — M25511 Pain in right shoulder: Secondary | ICD-10-CM | POA: Diagnosis not present

## 2017-08-05 DIAGNOSIS — M0579 Rheumatoid arthritis with rheumatoid factor of multiple sites without organ or systems involvement: Secondary | ICD-10-CM | POA: Diagnosis not present

## 2017-08-05 DIAGNOSIS — M47812 Spondylosis without myelopathy or radiculopathy, cervical region: Secondary | ICD-10-CM | POA: Diagnosis not present

## 2017-08-05 DIAGNOSIS — Z79899 Other long term (current) drug therapy: Secondary | ICD-10-CM | POA: Diagnosis not present

## 2017-08-05 DIAGNOSIS — M79606 Pain in leg, unspecified: Secondary | ICD-10-CM | POA: Diagnosis not present

## 2017-08-16 DIAGNOSIS — H2511 Age-related nuclear cataract, right eye: Secondary | ICD-10-CM | POA: Diagnosis not present

## 2017-08-16 DIAGNOSIS — Z961 Presence of intraocular lens: Secondary | ICD-10-CM | POA: Diagnosis not present

## 2017-08-18 DIAGNOSIS — H9202 Otalgia, left ear: Secondary | ICD-10-CM | POA: Diagnosis not present

## 2017-08-18 DIAGNOSIS — M2669 Other specified disorders of temporomandibular joint: Secondary | ICD-10-CM | POA: Diagnosis not present

## 2017-09-15 DIAGNOSIS — E1142 Type 2 diabetes mellitus with diabetic polyneuropathy: Secondary | ICD-10-CM | POA: Diagnosis not present

## 2017-09-15 DIAGNOSIS — E1165 Type 2 diabetes mellitus with hyperglycemia: Secondary | ICD-10-CM | POA: Diagnosis not present

## 2017-10-06 DIAGNOSIS — E1165 Type 2 diabetes mellitus with hyperglycemia: Secondary | ICD-10-CM | POA: Diagnosis not present

## 2017-10-06 DIAGNOSIS — M0579 Rheumatoid arthritis with rheumatoid factor of multiple sites without organ or systems involvement: Secondary | ICD-10-CM | POA: Diagnosis not present

## 2017-10-06 DIAGNOSIS — M47812 Spondylosis without myelopathy or radiculopathy, cervical region: Secondary | ICD-10-CM | POA: Diagnosis not present

## 2017-10-06 DIAGNOSIS — R21 Rash and other nonspecific skin eruption: Secondary | ICD-10-CM | POA: Diagnosis not present

## 2017-10-06 DIAGNOSIS — M25539 Pain in unspecified wrist: Secondary | ICD-10-CM | POA: Diagnosis not present

## 2017-10-06 DIAGNOSIS — M79643 Pain in unspecified hand: Secondary | ICD-10-CM | POA: Diagnosis not present

## 2017-10-06 DIAGNOSIS — Z79899 Other long term (current) drug therapy: Secondary | ICD-10-CM | POA: Diagnosis not present

## 2017-10-06 DIAGNOSIS — M25511 Pain in right shoulder: Secondary | ICD-10-CM | POA: Diagnosis not present

## 2017-10-06 DIAGNOSIS — M1990 Unspecified osteoarthritis, unspecified site: Secondary | ICD-10-CM | POA: Diagnosis not present

## 2017-10-06 DIAGNOSIS — I10 Essential (primary) hypertension: Secondary | ICD-10-CM | POA: Diagnosis not present

## 2017-10-06 DIAGNOSIS — R252 Cramp and spasm: Secondary | ICD-10-CM | POA: Diagnosis not present

## 2017-10-13 DIAGNOSIS — I872 Venous insufficiency (chronic) (peripheral): Secondary | ICD-10-CM | POA: Diagnosis not present

## 2017-10-13 DIAGNOSIS — I10 Essential (primary) hypertension: Secondary | ICD-10-CM | POA: Diagnosis not present

## 2017-10-13 DIAGNOSIS — E1140 Type 2 diabetes mellitus with diabetic neuropathy, unspecified: Secondary | ICD-10-CM | POA: Diagnosis not present

## 2017-10-13 DIAGNOSIS — E1165 Type 2 diabetes mellitus with hyperglycemia: Secondary | ICD-10-CM | POA: Diagnosis not present

## 2017-10-13 DIAGNOSIS — K429 Umbilical hernia without obstruction or gangrene: Secondary | ICD-10-CM | POA: Diagnosis not present

## 2017-11-02 ENCOUNTER — Ambulatory Visit: Payer: Self-pay | Admitting: Surgery

## 2017-11-02 DIAGNOSIS — K429 Umbilical hernia without obstruction or gangrene: Secondary | ICD-10-CM | POA: Diagnosis not present

## 2017-11-02 NOTE — H&P (View-Only) (Signed)
History of Present Illness Diane [name]. Draedyn Weidinger MD; 11/02/2017 10:30 AM) The patient is a 72 year old female who presents with an umbilical hernia. Referred by Dr. Ashby Dawes for umbilical hernia  This is a 72 year old female who presents with an enlarging umbilical hernia. This has been present for several years. Recently he has become more uncomfortable. She also has a rectus diastases and is occasionally symptomatic. The patient had a laparoscopic cholecystectomy performed in [place] in 2010. She has some chronic issues with pain around some scar tissue in the subcutaneous tissues in her right upper quadrant. She denies any obstructive symptoms. She has no issues with digestion. The patient is undergoing a lot of stress right now as her husband has recently passed away. She would like to have this hernia repaired to relieve the tenderness.  She had a CT scan of the abdomen and pelvis in 2017. CLINICAL DATA: Diffuse abdominal pain. Umbilical hernia. Previous hernia repair, cholecystectomy and hysterectomy with bilateral oophorectomy.  EXAM: CT ABDOMEN AND PELVIS WITH CONTRAST  TECHNIQUE: Multidetector CT imaging of the abdomen and pelvis was performed using the standard protocol following bolus administration of intravenous contrast.  CONTRAST: 114mL ISOVUE-300 IOPAMIDOL (ISOVUE-300) INJECTION 61%  COMPARISON: 08/22/2015.  FINDINGS: Lower chest: Minimal bilateral dependent atelectasis.  Hepatobiliary: Mild diffuse low density of the liver relative to the spleen without significant change. Cholecystectomy clips.  Pancreas: Unremarkable. No pancreatic ductal dilatation or surrounding inflammatory changes.  Spleen: Normal in size without focal abnormality.  Adrenals/Urinary Tract: Adrenal glands are unremarkable. Kidneys are normal, without renal calculi, focal lesion, or hydronephrosis. Bladder is unremarkable.  Stomach/Bowel: Stomach is within normal limits.  Appendix appears normal. No evidence of bowel wall thickening, distention, or inflammatory changes.  Vascular/Lymphatic: Mild arterial calcifications, not involving the abdominal aorta. No enlarged lymph nodes.  Reproductive: Surgically absent uterus and ovaries.  Other: Small umbilical hernia containing fat.  Musculoskeletal: Lumbar and lower thoracic spine degenerative changes.  IMPRESSION: 1. No acute abnormality. 2. Stable mild diffuse hepatic steatosis. 3. Stable small umbilical hernia containing fat.   Electronically Signed By: Claudie Revering M.D. On: 09/17/2016 13:33   Past Surgical History (April Staton, CMA; 11/02/2017 10:08 AM) Cataract Surgery Left. Gallbladder Surgery - Open Hysterectomy (not due to cancer) - Complete Hysterectomy (not due to cancer) - Partial Shoulder Surgery Right.  Diagnostic Studies History (April Staton, [place]; 11/02/2017 10:08 AM) Mammogram 1-3 years ago Pap Smear >5 years ago  Allergies (April Staton, CMA; 11/02/2017 10:10 AM) Codeine and Related Cephalexin *CEPHALOSPORINS*  Social History (April Staton, [place]; 11/02/2017 10:08 AM) Caffeine use Coffee. No alcohol use No drug use Tobacco use Never smoker.  Family History (April Staton, [place]; 11/02/2017 10:08 AM) Arthritis Father. Cervical Cancer Mother. Heart Disease Brother, Father. Hypertension Brother.  Pregnancy / Birth History (April Staton, [place]; 11/02/2017 10:08 AM) Age at menarche 89 years. Age of menopause 51-55 Contraceptive History Oral contraceptives. Gravida 3 Irregular periods Maternal age 30-25 Para 3  Other Problems (April Staton, CMA; 11/02/2017 10:08 AM) Arthritis Depression High blood pressure Umbilical Hernia Repair     Review of Systems (April Staton CMA; 11/02/2017 10:08 AM) General Not Present- Appetite Loss, Chills, Fatigue, Fever, Night Sweats, Weight Gain and Weight Loss. Skin Not Present- Change in Wart/Mole, Dryness, Hives,  Jaundice, New Lesions, Non-Healing Wounds, Rash and Ulcer. HEENT Present- Earache, Seasonal Allergies and Wears glasses/contact lenses. Not Present- Hearing Loss, Hoarseness, Nose Bleed, Oral Ulcers, Ringing in the Ears, Sinus Pain, Sore Throat, Visual Disturbances and Yellow Eyes. Respiratory Not Present-  Bloody sputum, Chronic Cough, Difficulty Breathing, Snoring and Wheezing. Breast Not Present- Breast Mass, Breast Pain, Nipple Discharge and Skin Changes. Cardiovascular Present- Leg Cramps. Not Present- Chest Pain, Difficulty Breathing Lying Down, Palpitations, Rapid Heart Rate, Shortness of Breath and Swelling of Extremities. Gastrointestinal Present- Abdominal Pain. Not Present- Bloating, Bloody Stool, Change in Bowel Habits, Chronic diarrhea, Constipation, Difficulty Swallowing, Excessive gas, Gets full quickly at meals, Hemorrhoids, Indigestion, Nausea, Rectal Pain and Vomiting.  Vitals (April Staton CMA; 11/02/2017 10:11 AM) 11/02/2017 10:10 AM Weight: 230.25 lb Temp.: 98.26F(Oral)  Pulse: 95 (Regular)  BP: 150/82 (Sitting, Left Arm, Standard)      Physical Exam Rodman Key K. Jerzy Crotteau MD; 11/02/2017 10:31 AM)  The physical exam findings are as follows: Note:WDWN in NAD Eyes: Pupils equal, round; sclera anicteric HENT: Oral mucosa moist; good dentition Neck: No masses palpated, no thyromegaly Lungs: CTA bilaterally; normal respiratory effort CV: Regular rate and rhythm; no murmurs; extremities well-perfused with no edema Abd: +bowel sounds, soft, non-tender, no palpable organomegaly; mildly tender in RUQ - firm 1 cm area of scarring in the subcutaneous just above one of her port sites Small reducible umbilical hernia - moderately tender Skin: Warm, dry; no sign of jaundice Psychiatric - alert and oriented x 4; calm mood and affect    Assessment & Plan Rodman Key K. Jerre Diguglielmo MD; 06/01/2750 70:01 AM)  UMBILICAL HERNIA WITHOUT OBSTRUCTION OR GANGRENE (K42.9)  Current Plans Schedule  for Surgery - Umbilical hernia repair with mesh. The surgical procedure has been discussed with the patient. Potential risks, benefits, alternative treatments, and expected outcomes have been explained. All of the patient's questions at this time have been answered. The likelihood of reaching the patient's treatment goal is good. The patient understand the proposed surgical procedure and wishes to proceed.  Diane [name]. [name], MD, [facility]  General/ Trauma Surgery  11/02/2017 10:31 AM

## 2017-11-02 NOTE — H&P (Signed)
History of Present Illness Diane [name]. Diane Lacson MD; 11/02/2017 10:30 AM) The patient is a 72 year old female who presents with an umbilical hernia. Referred by Dr. Ashby Dawes for umbilical hernia  This is a 72 year old female who presents with an enlarging umbilical hernia. This has been present for several years. Recently he has become more uncomfortable. She also has a rectus diastases and is occasionally symptomatic. The patient had a laparoscopic cholecystectomy performed in [place] in 2010. She has some chronic issues with pain around some scar tissue in the subcutaneous tissues in her right upper quadrant. She denies any obstructive symptoms. She has no issues with digestion. The patient is undergoing a lot of stress right now as her husband has recently passed away. She would like to have this hernia repaired to relieve the tenderness.  She had a CT scan of the abdomen and pelvis in 2017. CLINICAL DATA: Diffuse abdominal pain. Umbilical hernia. Previous hernia repair, cholecystectomy and hysterectomy with bilateral oophorectomy.  EXAM: CT ABDOMEN AND PELVIS WITH CONTRAST  TECHNIQUE: Multidetector CT imaging of the abdomen and pelvis was performed using the standard protocol following bolus administration of intravenous contrast.  CONTRAST: 11mL ISOVUE-300 IOPAMIDOL (ISOVUE-300) INJECTION 61%  COMPARISON: 08/22/2015.  FINDINGS: Lower chest: Minimal bilateral dependent atelectasis.  Hepatobiliary: Mild diffuse low density of the liver relative to the spleen without significant change. Cholecystectomy clips.  Pancreas: Unremarkable. No pancreatic ductal dilatation or surrounding inflammatory changes.  Spleen: Normal in size without focal abnormality.  Adrenals/Urinary Tract: Adrenal glands are unremarkable. Kidneys are normal, without renal calculi, focal lesion, or hydronephrosis. Bladder is unremarkable.  Stomach/Bowel: Stomach is within normal limits.  Appendix appears normal. No evidence of bowel wall thickening, distention, or inflammatory changes.  Vascular/Lymphatic: Mild arterial calcifications, not involving the abdominal aorta. No enlarged lymph nodes.  Reproductive: Surgically absent uterus and ovaries.  Other: Small umbilical hernia containing fat.  Musculoskeletal: Lumbar and lower thoracic spine degenerative changes.  IMPRESSION: 1. No acute abnormality. 2. Stable mild diffuse hepatic steatosis. 3. Stable small umbilical hernia containing fat.   Electronically Signed By: Claudie Revering M.D. On: 09/17/2016 13:33   Past Surgical History (April Staton, CMA; 11/02/2017 10:08 AM) Cataract Surgery Left. Gallbladder Surgery - Open Hysterectomy (not due to cancer) - Complete Hysterectomy (not due to cancer) - Partial Shoulder Surgery Right.  Diagnostic Studies History (April Staton, [place]; 11/02/2017 10:08 AM) Mammogram 1-3 years ago Pap Smear >5 years ago  Allergies (April Staton, CMA; 11/02/2017 10:10 AM) Codeine and Related Cephalexin *CEPHALOSPORINS*  Social History (April Staton, [place]; 11/02/2017 10:08 AM) Caffeine use Coffee. No alcohol use No drug use Tobacco use Never smoker.  Family History (April Staton, [place]; 11/02/2017 10:08 AM) Arthritis Father. Cervical Cancer Mother. Heart Disease Brother, Father. Hypertension Brother.  Pregnancy / Birth History (April Staton, [place]; 11/02/2017 10:08 AM) Age at menarche 48 years. Age of menopause 51-55 Contraceptive History Oral contraceptives. Gravida 3 Irregular periods Maternal age 101-25 Para 3  Other Problems (April Staton, CMA; 11/02/2017 10:08 AM) Arthritis Depression High blood pressure Umbilical Hernia Repair     Review of Systems (April Staton CMA; 11/02/2017 10:08 AM) General Not Present- Appetite Loss, Chills, Fatigue, Fever, Night Sweats, Weight Gain and Weight Loss. Skin Not Present- Change in Wart/Mole, Dryness, Hives,  Jaundice, New Lesions, Non-Healing Wounds, Rash and Ulcer. HEENT Present- Earache, Seasonal Allergies and Wears glasses/contact lenses. Not Present- Hearing Loss, Hoarseness, Nose Bleed, Oral Ulcers, Ringing in the Ears, Sinus Pain, Sore Throat, Visual Disturbances and Yellow Eyes. Respiratory Not Present-  Bloody sputum, Chronic Cough, Difficulty Breathing, Snoring and Wheezing. Breast Not Present- Breast Mass, Breast Pain, Nipple Discharge and Skin Changes. Cardiovascular Present- Leg Cramps. Not Present- Chest Pain, Difficulty Breathing Lying Down, Palpitations, Rapid Heart Rate, Shortness of Breath and Swelling of Extremities. Gastrointestinal Present- Abdominal Pain. Not Present- Bloating, Bloody Stool, Change in Bowel Habits, Chronic diarrhea, Constipation, Difficulty Swallowing, Excessive gas, Gets full quickly at meals, Hemorrhoids, Indigestion, Nausea, Rectal Pain and Vomiting.  Vitals (April Staton CMA; 11/02/2017 10:11 AM) 11/02/2017 10:10 AM Weight: 230.25 lb Temp.: 98.25F(Oral)  Pulse: 95 (Regular)  BP: 150/82 (Sitting, Left Arm, Standard)      Physical Exam Rodman Key K. Noris Kulinski MD; 11/02/2017 10:31 AM)  The physical exam findings are as follows: Note:WDWN in NAD Eyes: Pupils equal, round; sclera anicteric HENT: Oral mucosa moist; good dentition Neck: No masses palpated, no thyromegaly Lungs: CTA bilaterally; normal respiratory effort CV: Regular rate and rhythm; no murmurs; extremities well-perfused with no edema Abd: +bowel sounds, soft, non-tender, no palpable organomegaly; mildly tender in RUQ - firm 1 cm area of scarring in the subcutaneous just above one of her port sites Small reducible umbilical hernia - moderately tender Skin: Warm, dry; no sign of jaundice Psychiatric - alert and oriented x 4; calm mood and affect    Assessment & Plan Rodman Key K. Butch Otterson MD; 04/07/7033 03:52 AM)  UMBILICAL HERNIA WITHOUT OBSTRUCTION OR GANGRENE (K42.9)  Current Plans Schedule  for Surgery - Umbilical hernia repair with mesh. The surgical procedure has been discussed with the patient. Potential risks, benefits, alternative treatments, and expected outcomes have been explained. All of the patient's questions at this time have been answered. The likelihood of reaching the patient's treatment goal is good. The patient understand the proposed surgical procedure and wishes to proceed.  Diane [name]. [name], MD, [facility]  General/ Trauma Surgery  11/02/2017 10:31 AM

## 2017-11-03 DIAGNOSIS — M1990 Unspecified osteoarthritis, unspecified site: Secondary | ICD-10-CM | POA: Diagnosis not present

## 2017-11-03 DIAGNOSIS — M25511 Pain in right shoulder: Secondary | ICD-10-CM | POA: Diagnosis not present

## 2017-11-03 DIAGNOSIS — Z79899 Other long term (current) drug therapy: Secondary | ICD-10-CM | POA: Diagnosis not present

## 2017-11-03 DIAGNOSIS — R21 Rash and other nonspecific skin eruption: Secondary | ICD-10-CM | POA: Diagnosis not present

## 2017-11-03 DIAGNOSIS — R252 Cramp and spasm: Secondary | ICD-10-CM | POA: Diagnosis not present

## 2017-11-03 DIAGNOSIS — M25539 Pain in unspecified wrist: Secondary | ICD-10-CM | POA: Diagnosis not present

## 2017-11-03 DIAGNOSIS — M47812 Spondylosis without myelopathy or radiculopathy, cervical region: Secondary | ICD-10-CM | POA: Diagnosis not present

## 2017-11-03 DIAGNOSIS — M0579 Rheumatoid arthritis with rheumatoid factor of multiple sites without organ or systems involvement: Secondary | ICD-10-CM | POA: Diagnosis not present

## 2017-11-03 DIAGNOSIS — M79643 Pain in unspecified hand: Secondary | ICD-10-CM | POA: Diagnosis not present

## 2017-11-21 ENCOUNTER — Encounter (HOSPITAL_COMMUNITY): Payer: Self-pay

## 2017-11-21 ENCOUNTER — Other Ambulatory Visit (HOSPITAL_COMMUNITY): Payer: Self-pay | Admitting: *Deleted

## 2017-11-21 ENCOUNTER — Other Ambulatory Visit: Payer: Self-pay

## 2017-11-21 ENCOUNTER — Encounter (HOSPITAL_COMMUNITY)
Admission: RE | Admit: 2017-11-21 | Discharge: 2017-11-21 | Disposition: A | Discharge status: Home / Self Care | Payer: Medicare Other | Source: Ambulatory Visit | Attending: Surgery | Admitting: Surgery

## 2017-11-21 DIAGNOSIS — Z90710 Acquired absence of both cervix and uterus: Secondary | ICD-10-CM | POA: Diagnosis not present

## 2017-11-21 DIAGNOSIS — I10 Essential (primary) hypertension: Secondary | ICD-10-CM | POA: Diagnosis not present

## 2017-11-21 DIAGNOSIS — Z7984 Long term (current) use of oral hypoglycemic drugs: Secondary | ICD-10-CM | POA: Diagnosis not present

## 2017-11-21 DIAGNOSIS — Z79899 Other long term (current) drug therapy: Secondary | ICD-10-CM | POA: Diagnosis not present

## 2017-11-21 DIAGNOSIS — K429 Umbilical hernia without obstruction or gangrene: Secondary | ICD-10-CM | POA: Diagnosis not present

## 2017-11-21 DIAGNOSIS — F329 Major depressive disorder, single episode, unspecified: Secondary | ICD-10-CM | POA: Diagnosis not present

## 2017-11-21 DIAGNOSIS — Z7982 Long term (current) use of aspirin: Secondary | ICD-10-CM | POA: Diagnosis not present

## 2017-11-21 DIAGNOSIS — Z9049 Acquired absence of other specified parts of digestive tract: Secondary | ICD-10-CM | POA: Diagnosis not present

## 2017-11-21 DIAGNOSIS — E119 Type 2 diabetes mellitus without complications: Secondary | ICD-10-CM | POA: Diagnosis not present

## 2017-11-21 HISTORY — DX: Personal history of other diseases of the digestive system: Z87.19

## 2017-11-21 LAB — BASIC METABOLIC PANEL
Anion gap: 13 (ref 5–15)
BUN: 13 mg/dL (ref 6–20)
CALCIUM: 8.8 mg/dL — AB (ref 8.9–10.3)
CO2: 20 mmol/L — AB (ref 22–32)
Chloride: 107 mmol/L (ref 101–111)
Creatinine, Ser: 0.63 mg/dL (ref 0.44–1.00)
GFR calc Af Amer: >60 mL/min (ref >60)
GLUCOSE: 83 mg/dL (ref 65–99)
Potassium: 4.6 mmol/L (ref 3.5–5.1)
Sodium: 140 mmol/L (ref 135–145)

## 2017-11-21 LAB — CBC
HCT: 41.8 % (ref 36.0–46.0)
Hemoglobin: 13.1 g/dL (ref 12.0–15.0)
MCH: 28.5 pg (ref 26.0–34.0)
MCHC: 31.3 g/dL (ref 30.0–36.0)
MCV: 90.9 fL (ref 78.0–100.0)
Platelets: 114 10*3/uL — ABNORMAL LOW (ref 150–400)
RBC: 4.6 MIL/uL (ref 3.87–5.11)
RDW: 15.6 % — AB (ref 11.5–15.5)
WBC: 6.7 10*3/uL (ref 4.0–10.5)

## 2017-11-21 LAB — GLUCOSE, CAPILLARY: GLUCOSE-CAPILLARY: 82 mg/dL (ref 65–99)

## 2017-11-21 NOTE — Progress Notes (Signed)
Pt denies cardiac history, chest pain or sob. Pt is a type 2 diabetic. She states her last A1C was a month ago and it was "7 something". I have requested A1C result from Dr. Loni Muse. Ramachandran's office. Pt states she does not check her blood sugar at home, never has gotten a CBG meter.

## 2017-11-21 NOTE — Pre-Procedure Instructions (Signed)
Diane [name]  11/21/2017    Your procedure is scheduled on Thursday, November 24, 2017 at 9:30 AM.   Report to [facility] Entrance "A" Admitting Office at 7:30 AM.   Call this number if you have problems the morning of surgery: [phone]   Questions prior to day of surgery, please call [phone] between 8 & 4 PM.   Remember:  Do not eat food or drink liquids after midnight Wednesday, 11/23/17.  Take these medicines the morning of surgery with A SIP OF WATER: Duloxetine (Cymbalta), Gabapentin (Neurontin), Prednisone, Loratadine (Claritin)- if needed, Omeprazole (Prilosec) - if needed  Do not take Metformin the morning of surgery.  Stop Multivitamins and NSAIDS (Naproxen, Aleve, Ibuprofen, etc) as of today.   How to Manage Your Diabetes Before Surgery   Why is it important to control my blood sugar before and after surgery?   Improving blood sugar levels before and after surgery helps healing and can limit problems.  A way of improving blood sugar control is eating a healthy diet by:  - Eating less sugar and carbohydrates  - Increasing activity/exercise  - Talk with your doctor about reaching your blood sugar goals  High blood sugars (greater than 180 mg/dL) can raise your risk of infections and slow down your recovery so you will need to focus on controlling your diabetes during the weeks before surgery.  Make sure that the doctor who takes care of your diabetes knows about your planned surgery including the date and location.  How do I manage my blood sugars before surgery?   Check your blood sugar at least 4 times a day, 2 days before surgery to make sure that they are not too high or low.  Check your blood sugar the morning of your surgery when you wake up and every 2 hours until you get to the Short-Stay unit.  Treat a low blood sugar (less than 70 mg/dL) with 1/2 cup of clear juice (cranberry or apple), 4 glucose tablets, OR glucose gel.  Recheck  blood sugar in 15 minutes after treatment (to make sure it is greater than 70 mg/dL).  If blood sugar is not greater than 70 mg/dL on re-check, call [phone] for further instructions.   Report your blood sugar to the Short-Stay nurse when you get to Short-Stay.  References:  [facility], 2007 "How to Manage your Diabetes Before and After Surgery".   Do not wear jewelry, make-up or nail polish.  Do not wear lotions, powders, perfumes or deodorant.  Do not shave 48 hours prior to surgery.    Do not bring valuables to the hospital.  [facility] is not responsible for any belongings or valuables.  Contacts, dentures or bridgework may not be worn into surgery.  Leave your suitcase in the car.  After surgery it may be brought to your room.  For patients admitted to the hospital, discharge time will be determined by your treatment team.  Patients discharged the day of surgery will not be allowed to drive home.   [facility] - Preparing for Surgery  Before surgery, you can play an important role.  Because skin is not sterile, your skin needs to be as free of germs as possible.  You can reduce the number of germs on you skin by washing with CHG (chlorahexidine gluconate) soap before surgery.  CHG is an antiseptic cleaner which kills germs and bonds with the skin to continue killing germs even after washing.  Please DO NOT use if you have an allergy to CHG or antibacterial soaps.  If your skin becomes reddened/irritated stop using the CHG and inform your nurse when you arrive at Short Stay.  Do not shave (including legs and underarms) for at least 48 hours prior to the first CHG shower.  You may shave your face.  Please follow these instructions carefully:   1.  Shower with CHG Soap the night before surgery and the                    morning of Surgery.  2.  If you choose to wash your hair, wash your hair first as usual with your       normal shampoo.  3.  After  you shampoo, rinse your hair and body thoroughly to remove the shampoo.  4.  Use CHG as you would any other liquid soap.  You can apply chg directly       to the skin and wash gently with scrungie or a clean washcloth.  5.  Apply the CHG Soap to your body ONLY FROM THE NECK DOWN.        Do not use on open wounds or open sores.  Avoid contact with your eyes, ears, mouth and genitals (private parts).  Wash genitals (private parts) with your normal soap.  6.  Wash thoroughly, paying special attention to the area where your surgery        will be performed.  7.  Thoroughly rinse your body with warm water from the neck down.  8.  DO NOT shower/wash with your normal soap after using and rinsing off       the CHG Soap.  9.  Pat yourself dry with a clean towel.            10.  Wear clean pajamas.            11.  Place clean sheets on your bed the night of your first shower and do not        sleep with pets.  Day of Surgery  Shower as above. Do not apply any lotions/deodorants the morning of surgery.  Please wear clean clothes to the hospital.   Please read over the fact sheets that you were given.

## 2017-11-23 MED ORDER — SODIUM CHLORIDE 0.9 % IV SOLN
1500.0000 mg | INTRAVENOUS | Status: AC
  Administered 2017-11-24: 1500 mg via INTRAVENOUS
  Filled 2017-11-23: qty 1500

## 2017-11-24 ENCOUNTER — Encounter (HOSPITAL_COMMUNITY)
Admission: RE | Disposition: A | Discharge status: Home / Self Care | Payer: Self-pay | Source: Ambulatory Visit | Attending: Surgery

## 2017-11-24 ENCOUNTER — Encounter (HOSPITAL_COMMUNITY): Payer: Self-pay | Admitting: Orthopedic Surgery

## 2017-11-24 ENCOUNTER — Ambulatory Visit (HOSPITAL_COMMUNITY): Payer: Medicare Other | Admitting: Anesthesiology

## 2017-11-24 ENCOUNTER — Ambulatory Visit (HOSPITAL_COMMUNITY): Payer: Medicare Other

## 2017-11-24 ENCOUNTER — Ambulatory Visit (HOSPITAL_COMMUNITY)
Admission: RE | Admit: 2017-11-24 | Discharge: 2017-11-24 | Disposition: A | Discharge status: Home / Self Care | Payer: Medicare Other | Source: Ambulatory Visit | Attending: Surgery | Admitting: Surgery

## 2017-11-24 DIAGNOSIS — Z7984 Long term (current) use of oral hypoglycemic drugs: Secondary | ICD-10-CM | POA: Insufficient documentation

## 2017-11-24 DIAGNOSIS — Z79899 Other long term (current) drug therapy: Secondary | ICD-10-CM | POA: Diagnosis not present

## 2017-11-24 DIAGNOSIS — Z90710 Acquired absence of both cervix and uterus: Secondary | ICD-10-CM | POA: Insufficient documentation

## 2017-11-24 DIAGNOSIS — K429 Umbilical hernia without obstruction or gangrene: Secondary | ICD-10-CM | POA: Insufficient documentation

## 2017-11-24 DIAGNOSIS — F329 Major depressive disorder, single episode, unspecified: Secondary | ICD-10-CM | POA: Insufficient documentation

## 2017-11-24 DIAGNOSIS — R079 Chest pain, unspecified: Secondary | ICD-10-CM | POA: Diagnosis not present

## 2017-11-24 DIAGNOSIS — Z7982 Long term (current) use of aspirin: Secondary | ICD-10-CM | POA: Insufficient documentation

## 2017-11-24 DIAGNOSIS — I10 Essential (primary) hypertension: Secondary | ICD-10-CM | POA: Diagnosis not present

## 2017-11-24 DIAGNOSIS — E119 Type 2 diabetes mellitus without complications: Secondary | ICD-10-CM | POA: Insufficient documentation

## 2017-11-24 DIAGNOSIS — Z9049 Acquired absence of other specified parts of digestive tract: Secondary | ICD-10-CM | POA: Insufficient documentation

## 2017-11-24 DIAGNOSIS — Z09 Encounter for follow-up examination after completed treatment for conditions other than malignant neoplasm: Secondary | ICD-10-CM

## 2017-11-24 HISTORY — PX: INSERTION OF MESH: SHX5868

## 2017-11-24 HISTORY — PX: UMBILICAL HERNIA REPAIR: SHX196

## 2017-11-24 LAB — GLUCOSE, CAPILLARY
GLUCOSE-CAPILLARY: 125 mg/dL — AB (ref 65–99)
GLUCOSE-CAPILLARY: 116 mg/dL — AB (ref 65–99)

## 2017-11-24 IMAGING — CR DG ABD PORTABLE 1V
2 series · 2 of 2 positions shown · non-contrast
Comparison: None.

CLINICAL DATA: Lost surgical needle

EXAM:
PORTABLE ABDOMEN - 1 VIEW

[AP (1 of 2)]
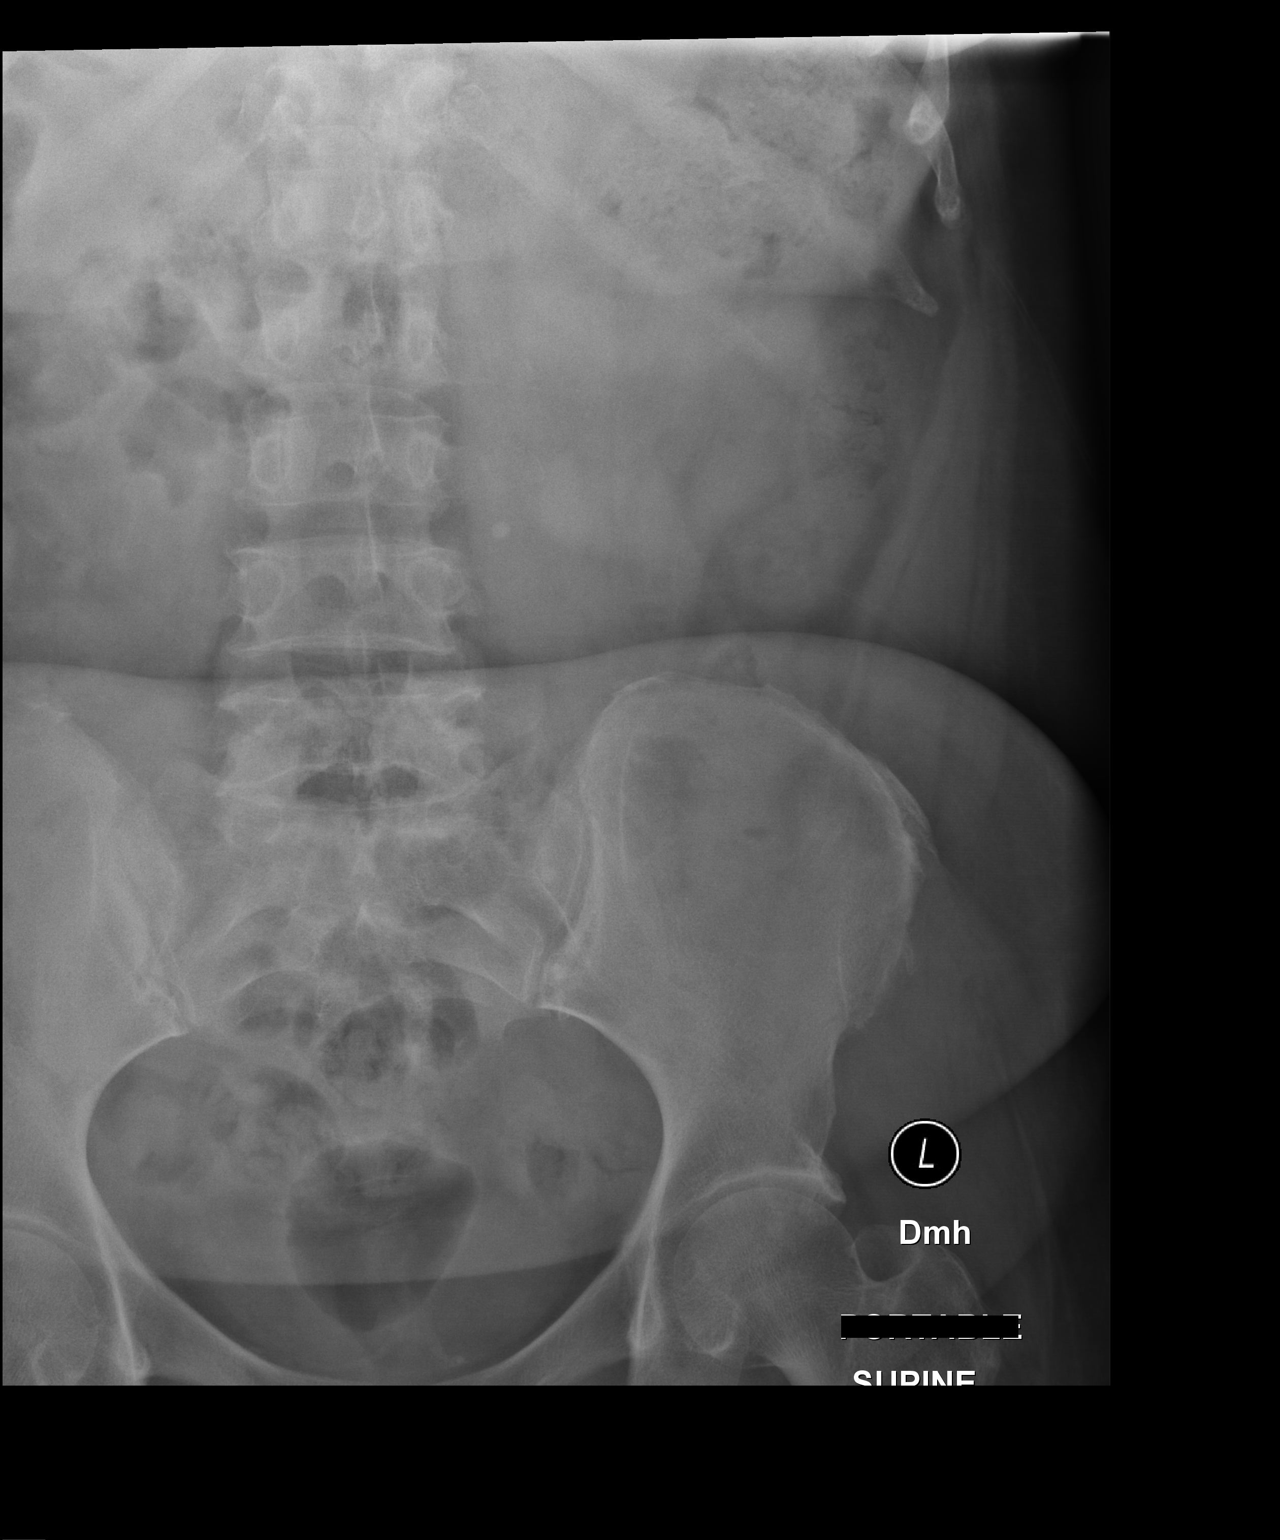

[AP (2 of 2)]
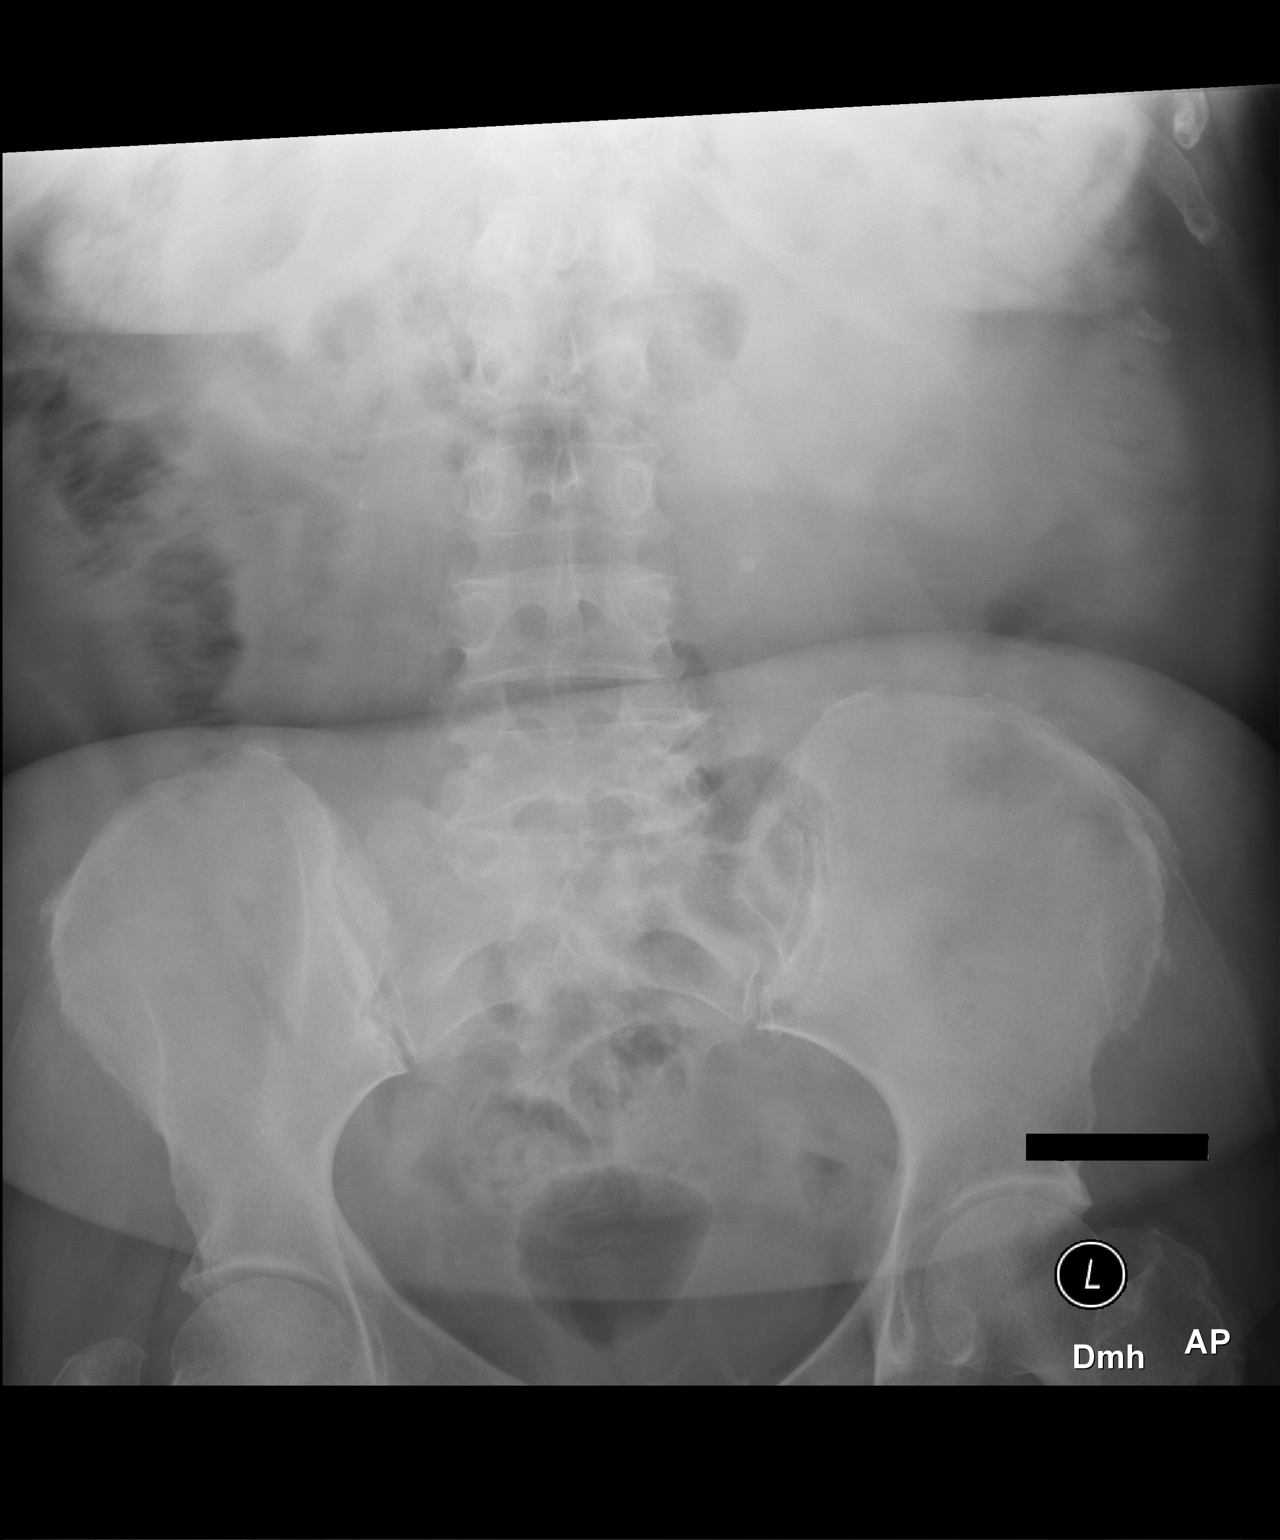

[2 of 2 positions shown; findings below may reference images not displayed]

FINDINGS: No surgical needle evident. There is a calcification to the left of
L3-4 measuring 5 x 5 mm. There is moderate stool in the colon. There
is no bowel dilatation or air-fluid level to suggest bowel
obstruction. No free air.
IMPRESSION: No retained needle evident. Calcification to the left of L3-4 on the
left. No bowel obstruction or free air evident.

Critical Value/emergent results were called by telephone at the time
of interpretation on 11/24/2017 at [DATE] to. Petunija Tariba, RN , who
verbally acknowledged these results.

## 2017-11-24 SURGERY — REPAIR, HERNIA, UMBILICAL, ADULT
Anesthesia: General | Site: Abdomen

## 2017-11-24 MED ORDER — ONDANSETRON HCL 4 MG/2ML IJ SOLN
INTRAMUSCULAR | Status: DC | PRN
  Administered 2017-11-24: 4 mg via INTRAVENOUS

## 2017-11-24 MED ORDER — CHLORHEXIDINE GLUCONATE CLOTH 2 % EX PADS: 6.0000 | MEDICATED_PAD | Freq: Once | CUTANEOUS | Status: DC

## 2017-11-24 MED ORDER — MIDAZOLAM HCL 2 MG/2ML IJ SOLN
INTRAMUSCULAR | Status: AC
  Filled 2017-11-24: qty 2

## 2017-11-24 MED ORDER — BUPIVACAINE-EPINEPHRINE (PF) 0.5% -1:200000 IJ SOLN
INTRAMUSCULAR | Status: DC | PRN
  Administered 2017-11-24: 10 mL

## 2017-11-24 MED ORDER — 0.9 % SODIUM CHLORIDE (POUR BTL) OPTIME
TOPICAL | Status: DC | PRN
  Administered 2017-11-24: 1000 mL

## 2017-11-24 MED ORDER — PROPOFOL 10 MG/ML IV BOLUS
INTRAVENOUS | Status: AC
  Filled 2017-11-24: qty 20

## 2017-11-24 MED ORDER — KETOROLAC TROMETHAMINE 30 MG/ML IJ SOLN
INTRAMUSCULAR | Status: AC
  Filled 2017-11-24: qty 1

## 2017-11-24 MED ORDER — FENTANYL CITRATE (PF) 250 MCG/5ML IJ SOLN
INTRAMUSCULAR | Status: AC
  Filled 2017-11-24: qty 5

## 2017-11-24 MED ORDER — PHENYLEPHRINE 40 MCG/ML (10ML) SYRINGE FOR IV PUSH (FOR BLOOD PRESSURE SUPPORT)
PREFILLED_SYRINGE | INTRAVENOUS | Status: DC | PRN
  Administered 2017-11-24: 80 ug via INTRAVENOUS

## 2017-11-24 MED ORDER — DEXAMETHASONE SODIUM PHOSPHATE 10 MG/ML IJ SOLN
INTRAMUSCULAR | Status: DC | PRN
  Administered 2017-11-24: 5 mg via INTRAVENOUS

## 2017-11-24 MED ORDER — FENTANYL CITRATE (PF) 250 MCG/5ML IJ SOLN
INTRAMUSCULAR | Status: DC | PRN
  Administered 2017-11-24 (×2): 25 ug via INTRAVENOUS

## 2017-11-24 MED ORDER — PROPOFOL 10 MG/ML IV BOLUS
INTRAVENOUS | Status: DC | PRN
  Administered 2017-11-24: 30 mg via INTRAVENOUS
  Administered 2017-11-24: 150 mg via INTRAVENOUS

## 2017-11-24 MED ORDER — DEXAMETHASONE SODIUM PHOSPHATE 10 MG/ML IJ SOLN
INTRAMUSCULAR | Status: AC
  Filled 2017-11-24: qty 1

## 2017-11-24 MED ORDER — FENTANYL CITRATE (PF) 100 MCG/2ML IJ SOLN: 25.0000 ug | INTRAMUSCULAR | Status: DC | PRN

## 2017-11-24 MED ORDER — LIDOCAINE 2% (20 MG/ML) 5 ML SYRINGE
INTRAMUSCULAR | Status: DC | PRN
  Administered 2017-11-24: 80 mg via INTRAVENOUS

## 2017-11-24 MED ORDER — KETOROLAC TROMETHAMINE 30 MG/ML IJ SOLN
INTRAMUSCULAR | Status: DC | PRN
  Administered 2017-11-24: 15 mg via INTRAVENOUS

## 2017-11-24 MED ORDER — LIDOCAINE 2% (20 MG/ML) 5 ML SYRINGE
INTRAMUSCULAR | Status: AC
  Filled 2017-11-24: qty 5

## 2017-11-24 MED ORDER — MIDAZOLAM HCL 5 MG/5ML IJ SOLN
INTRAMUSCULAR | Status: DC | PRN
  Administered 2017-11-24: 1 mg via INTRAVENOUS

## 2017-11-24 MED ORDER — OXYCODONE HCL 5 MG PO TABS: 5.0000 mg | ORAL_TABLET | Freq: Four times a day (QID) | ORAL | 0 refills | Status: DC | PRN

## 2017-11-24 MED ORDER — BUPIVACAINE-EPINEPHRINE (PF) 0.5% -1:200000 IJ SOLN
INTRAMUSCULAR | Status: AC
  Filled 2017-11-24: qty 30

## 2017-11-24 MED ORDER — ONDANSETRON HCL 4 MG/2ML IJ SOLN
INTRAMUSCULAR | Status: AC
  Filled 2017-11-24: qty 2

## 2017-11-24 MED ORDER — LACTATED RINGERS IV SOLN
INTRAVENOUS | Status: DC
  Administered 2017-11-24: 09:00:00 via INTRAVENOUS

## 2017-11-24 MED ORDER — PHENYLEPHRINE 40 MCG/ML (10ML) SYRINGE FOR IV PUSH (FOR BLOOD PRESSURE SUPPORT)
PREFILLED_SYRINGE | INTRAVENOUS | Status: AC
  Filled 2017-11-24: qty 10

## 2017-11-24 SURGICAL SUPPLY — 45 items
APL SKNCLS STERI-STRIP NONHPOA (GAUZE/BANDAGES/DRESSINGS) ×1
BENZOIN TINCTURE PRP APPL 2/3 (GAUZE/BANDAGES/DRESSINGS) ×3 IMPLANT
BLADE CLIPPER SURG (BLADE) IMPLANT
BLADE SURG 15 STRL LF DISP TIS (BLADE) ×1 IMPLANT
BLADE SURG 15 STRL SS (BLADE) ×3
CANISTER SUCT 3000ML PPV (MISCELLANEOUS) IMPLANT
CHLORAPREP W/TINT 26ML (MISCELLANEOUS) ×3 IMPLANT
CLOSURE WOUND 1/2 X4 (GAUZE/BANDAGES/DRESSINGS) ×1
COVER SURGICAL LIGHT HANDLE (MISCELLANEOUS) ×3 IMPLANT
DRAPE LAPAROTOMY 100X72 PEDS (DRAPES) ×3 IMPLANT
DRAPE UTILITY XL STRL (DRAPES) ×3 IMPLANT
DRSG TEGADERM 4X4.75 (GAUZE/BANDAGES/DRESSINGS) ×3 IMPLANT
ELECT CAUTERY BLADE 6.4 (BLADE) ×3 IMPLANT
ELECT REM PT RETURN 9FT ADLT (ELECTROSURGICAL) ×3
ELECTRODE REM PT RTRN 9FT ADLT (ELECTROSURGICAL) ×1 IMPLANT
GAUZE SPONGE 2X2 8PLY STRL LF (GAUZE/BANDAGES/DRESSINGS) ×1 IMPLANT
GAUZE SPONGE 4X4 16PLY XRAY LF (GAUZE/BANDAGES/DRESSINGS) ×3 IMPLANT
GLOVE BIO SURGEON STRL SZ7 (GLOVE) ×3 IMPLANT
GLOVE BIOGEL PI IND STRL 7.5 (GLOVE) ×1 IMPLANT
GLOVE BIOGEL PI INDICATOR 7.5 (GLOVE) ×2
GOWN STRL REUS W/ TWL LRG LVL3 (GOWN DISPOSABLE) ×2 IMPLANT
GOWN STRL REUS W/TWL LRG LVL3 (GOWN DISPOSABLE) ×6
KIT BASIN OR (CUSTOM PROCEDURE TRAY) ×3 IMPLANT
KIT ROOM TURNOVER OR (KITS) ×3 IMPLANT
MESH VENTRALEX ST 1-7/10 CRC S (Mesh General) ×2 IMPLANT
NDL HYPO 25GX1X1/2 BEV (NEEDLE) ×1 IMPLANT
NEEDLE HYPO 25GX1X1/2 BEV (NEEDLE) ×3 IMPLANT
NS IRRIG 1000ML POUR BTL (IV SOLUTION) ×3 IMPLANT
PACK SURGICAL SETUP 50X90 (CUSTOM PROCEDURE TRAY) ×3 IMPLANT
PAD ARMBOARD 7.5X6 YLW CONV (MISCELLANEOUS) ×3 IMPLANT
PENCIL BUTTON HOLSTER BLD 10FT (ELECTRODE) ×3 IMPLANT
SPONGE GAUZE 2X2 STER 10/PKG (GAUZE/BANDAGES/DRESSINGS) ×2
STRIP CLOSURE SKIN 1/2X4 (GAUZE/BANDAGES/DRESSINGS) ×2 IMPLANT
SUT MNCRL AB 4-0 PS2 18 (SUTURE) ×3 IMPLANT
SUT NOVA NAB DX-16 0-1 5-0 T12 (SUTURE) ×3 IMPLANT
SUT NOVA NAB GS-21 0 18 T12 DT (SUTURE) ×3 IMPLANT
SUT VIC AB 3-0 SH 27 (SUTURE) ×3
SUT VIC AB 3-0 SH 27X BRD (SUTURE) ×1 IMPLANT
SYR BULB 3OZ (MISCELLANEOUS) ×3 IMPLANT
SYR CONTROL 10ML LL (SYRINGE) ×3 IMPLANT
TOWEL OR 17X24 6PK STRL BLUE (TOWEL DISPOSABLE) ×3 IMPLANT
TOWEL OR 17X26 10 PK STRL BLUE (TOWEL DISPOSABLE) ×3 IMPLANT
TUBE CONNECTING 12'X1/4 (SUCTIONS)
TUBE CONNECTING 12X1/4 (SUCTIONS) IMPLANT
YANKAUER SUCT BULB TIP NO VENT (SUCTIONS) IMPLANT

## 2017-11-24 NOTE — Interval H&P Note (Signed)
History and Physical Interval Note:  11/24/2017 8:42 AM  Diane [name]  has presented today for surgery, with the diagnosis of UMBILICAL HERNIA  The various methods of treatment have been discussed with the patient and family. After consideration of risks, benefits and other options for treatment, the patient has consented to  Procedure(s): UMBILICAL HERINA REPAIR (N/A) INSERTION OF MESH (N/A) as a surgical intervention .  The patient's history has been reviewed, patient examined, no change in status, stable for surgery.  I have reviewed the patient's chart and labs.  Questions were answered to the patient's satisfaction.     Maia Petties

## 2017-11-24 NOTE — Anesthesia Procedure Notes (Signed)
Procedure Name: LMA Insertion Date/Time: 11/24/2017 9:22 AM Performed by: Freddie Breech, CRNA Pre-anesthesia Checklist: Patient identified, Emergency Drugs available, Suction available and Patient being monitored Patient Re-evaluated:Patient Re-evaluated prior to induction Oxygen Delivery Method: Circle System Utilized Preoxygenation: Pre-oxygenation with 100% oxygen Induction Type: IV induction Ventilation: Mask ventilation without difficulty LMA: LMA inserted LMA Size: 5.0 Number of attempts: 2 (CRNA attempted to place LMA and unable to achieve adequate seal.  LMA removed and replaced with seal greater than 20 cmH2O.  ) Airway Equipment and Method: Bite block Placement Confirmation: positive ETCO2 Tube secured with: Tape Dental Injury: Teeth and Oropharynx as per pre-operative assessment

## 2017-11-24 NOTE — Discharge Instructions (Signed)
[facility] _______[facility] ° °UMBILICAL  HERNIA REPAIR: POST OP INSTRUCTIONS ° °Always review your discharge instruction sheet given to you by the facility where your surgery was performed. °IF YOU HAVE DISABILITY OR FAMILY LEAVE FORMS, YOU MUST BRING THEM TO THE OFFICE FOR PROCESSING.   °DO NOT GIVE THEM TO YOUR DOCTOR. ° °1. A  prescription for pain medication may be given to you upon discharge.  Take your pain medication as prescribed, if needed.  If narcotic pain medicine is not needed, then you may take acetaminophen (Tylenol) or ibuprofen (Advil) as needed. °2. Take your usually prescribed medications unless otherwise directed. °If you need a refill on your pain medication, please contact your pharmacy.  They will contact our office to request authorization. Prescriptions will not be filled after 5 pm or on week-ends. °3. You should follow a light diet the first 24 hours after arrival home, such as soup and crackers, etc.  Be sure to include lots of fluids daily.  Resume your normal diet the day after surgery. °4.Most patients will experience some swelling and bruising around the umbilicus or in the groin and scrotum.  Ice packs and reclining will help.  Swelling and bruising can take several days to resolve.  °6. It is common to experience some constipation if taking pain medication after surgery.  Increasing fluid intake and taking a stool softener (such as Colace) will usually help or prevent this problem from occurring.  A mild laxative (Milk of Magnesia or Miralax) should be taken according to package directions if there are no bowel movements after 48 hours. °7. Unless discharge instructions indicate otherwise, you may remove your bandages 24-48 hours after surgery, and you may shower at that time.  You may have steri-strips (small skin tapes) in place directly over the incision.  These strips should be left on the skin for 7-10 days.  If your surgeon used skin glue on the incision, you may  shower in 24 hours.  The glue will flake off over the next 2-3 weeks.  Any sutures or staples will be removed at the office during your follow-up visit. °8. ACTIVITIES:  You may resume regular (light) daily activities beginning the next day--such as daily self-care, walking, climbing stairs--gradually increasing activities as tolerated.  You may have sexual intercourse when it is comfortable.  Refrain from any heavy lifting or straining until approved by your doctor. ° °a.You may drive when you are no longer taking prescription pain medication, you can comfortably wear a seatbelt, and you can safely maneuver your car and apply brakes. °b.RETURN TO WORK:   °_____________________________________________ ° °9.You should see your doctor in the office for a follow-up appointment approximately 2-3 weeks after your surgery.  Make sure that you call for this appointment within a day or two after you arrive home to insure a convenient appointment time. °10.OTHER INSTRUCTIONS: _________________________ °   _____________________________________ ° °WHEN TO CALL YOUR DOCTOR: °1. Fever over 101.0 °2. Inability to urinate °3. Nausea and/or vomiting °4. Extreme swelling or bruising °5. Continued bleeding from incision. °6. Increased pain, redness, or drainage from the incision ° °The clinic staff is available to answer your questions during regular business hours.  Please don’t hesitate to call and ask to speak to one of the nurses for clinical concerns.  If you have a medical emergency, go to the nearest emergency room or call 911.  A surgeon from [facility] is always on call at the hospital ° ° °[address], [address], [place], [place]  [address] ? ° [address] [address], [place], [place]   [address] °[phone] ? [phone] ? FAX [phone] °Web site: [email or web address] ° °

## 2017-11-24 NOTE — Transfer of Care (Signed)
Immediate Anesthesia Transfer of Care Note  Patient: Diane [name]  Procedure(s) Performed: UMBILICAL HERINA REPAIR (N/A Abdomen) INSERTION OF MESH (N/A Abdomen)  Patient Location: PACU  Anesthesia Type:General  Level of Consciousness: drowsy and patient cooperative  Airway & Oxygen Therapy: Patient Spontanous Breathing and Patient connected to face mask oxygen  Post-op Assessment: Report given to RN and Post -op Vital signs reviewed and stable  Post vital signs: Reviewed and stable  Last Vitals:  Vitals:   11/24/17 0809 11/24/17 1021  BP: (!) 149/66   Pulse: 89   Resp: 20   Temp: 36.9 C (P) 36.8 C  SpO2: 98%     Last Pain:  Vitals:   11/24/17 0809  TempSrc: Oral         Complications: No apparent anesthesia complications

## 2017-11-24 NOTE — Anesthesia Preprocedure Evaluation (Addendum)
Anesthesia Evaluation  Patient identified by MRN, date of birth, ID band Patient awake    Reviewed: Allergy & Precautions, H&P , Patient's Chart, lab work & pertinent test results, reviewed documented beta blocker date and time   Airway Mallampati: II  TM Distance: >3 FB Neck ROM: full    Dental no notable dental hx.    Pulmonary    Pulmonary exam normal breath sounds clear to auscultation       Cardiovascular hypertension,  Rhythm:regular Rate:Normal     Neuro/Psych    GI/Hepatic   Endo/Other  diabetes  Renal/GU      Musculoskeletal   Abdominal   Peds  Hematology   Anesthesia Other Findings Hypertension : per cath in 2018;normal coronary arteries and normal LV function. It is believed her stress test was false positive and her chest pain noncardiac   Diabetes mellitus  dx over 5 yrs ago GERD   prolonged Q-T interval on ECG  Pulmonary edema         Reproductive/Obstetrics                            Anesthesia Physical Anesthesia Plan  ASA: III  Anesthesia Plan: General   Post-op Pain Management:    Induction: Intravenous  PONV Risk Score and Plan: 2 and Dexamethasone, Ondansetron and Treatment may vary due to age or medical condition  Airway Management Planned: LMA  Additional Equipment:   Intra-op Plan:   Post-operative Plan:   Informed Consent: I have reviewed the patients History and Physical, chart, labs and discussed the procedure including the risks, benefits and alternatives for the proposed anesthesia with the patient or authorized representative who has indicated his/her understanding and acceptance.   Dental Advisory Given  Plan Discussed with: CRNA and Surgeon  Anesthesia Plan Comments: ( )       Anesthesia Quick Evaluation

## 2017-11-24 NOTE — Op Note (Signed)
Indications:  The patient presented with a history of an enlarging, uncomfortable umbilical hernia.  The patient was examined and we recommended umbilical hernia repair with mesh.  Pre-operative diagnosis:  Umbilical hernia  Post-operative diagnosis:  Same  Procedure:  Umbilical hernia repair with mesh  Procedure Details  The patient was seen again in the Holding Room. The risks, benefits, complications, treatment options, and expected outcomes were discussed with the patient. The possibilities of reaction to medication, pulmonary aspiration, perforation of viscus, bleeding, recurrent infection, the need for additional procedures, and development of a complication requiring transfusion or further operation were discussed with the patient and/or family. There was concurrence with the proposed plan, and informed consent was obtained. The site of surgery was properly noted/marked. The patient was taken to the Operating Room, identified as [name], and the procedure verified as umbilical hernia repair. A Time Out was held and the above information confirmed.  After an adequate level of general anesthesia was obtained, the patient's abdomen was prepped with Chloraprep and draped in sterile fashion.  We made a transverse incision above the umbilicus.  Dissection was carried down to the hernia sac with cautery.  We dissected bluntly around the hernia sac down to the edge of the fascial defect.  We reduced the hernia sac back into the pre-peritoneal space.  The fascial defect measured 1.8 cm.  We cleared the fascia in all directions.  A small Ventralex mesh was inserted into the pre-peritoneal space and was deployed.  The mesh was secured with four trans-fascial sutures of 0 Novofil.    While we were securing the mesh, one of the needles was lost.  The mesh was already in place and we carefully inspected the small wound.  The needle is fairly large and there was no sign of the needle within the surgical  wound.   The fascial defect was closed with multiple interrupted figure-of-eight 1 Novofil sutures.  The base of the umbilicus was tacked down with 3-0 Vicryl.  3-0 Vicryl was used to close the subcutaneous tissues and 4-0 Monocryl was used to close the skin.  Steri-strips and clean dressing were applied.    We obtained plain films of the abdomen which were negative for retained foreign body. The patient was extubated and brought to the recovery room in stable condition.  All sponge, instrument counts were correct prior to closure and at the conclusion of the case.   Estimated Blood Loss: Minimal          Complications: None; patient tolerated the procedure well.         Disposition: PACU - hemodynamically stable.         Condition: stable  Diane [name]. [name], MD, [facility]  General/ Trauma Surgery  11/24/2017 10:22 AM

## 2017-11-25 ENCOUNTER — Encounter (HOSPITAL_COMMUNITY): Payer: Self-pay | Admitting: Surgery

## 2017-11-29 NOTE — Anesthesia Postprocedure Evaluation (Signed)
Anesthesia Post Note  Patient: Diane [name]  Procedure(s) Performed: UMBILICAL HERINA REPAIR (N/A Abdomen) INSERTION OF MESH (N/A Abdomen)     Patient location during evaluation: PACU Anesthesia Type: General Level of consciousness: awake and alert Pain management: pain level controlled Vital Signs Assessment: post-procedure vital signs reviewed and stable Respiratory status: spontaneous breathing, nonlabored ventilation, respiratory function stable and patient connected to nasal cannula oxygen Cardiovascular status: blood pressure returned to baseline and stable Postop Assessment: no apparent nausea or vomiting Anesthetic complications: no    Last Vitals:  Vitals:   11/24/17 1100 11/24/17 1106  BP:  (!) 141/75  Pulse: 83 85  Resp: (!) 21 (!) 21  Temp: (!) 36.3 C   SpO2: 97% 97%    Last Pain:  Vitals:   11/24/17 1045  TempSrc:   PainSc: 0-No pain                 Anastasios Melander EDWARD

## 2017-12-06 ENCOUNTER — Other Ambulatory Visit: Payer: Self-pay | Admitting: Internal Medicine

## 2017-12-06 DIAGNOSIS — Z1231 Encounter for screening mammogram for malignant neoplasm of breast: Secondary | ICD-10-CM

## 2017-12-07 DIAGNOSIS — Z23 Encounter for immunization: Secondary | ICD-10-CM | POA: Diagnosis not present

## 2017-12-22 ENCOUNTER — Ambulatory Visit
Admission: RE | Admit: 2017-12-22 | Discharge: 2017-12-22 | Disposition: A | Discharge status: Home / Self Care | Payer: Medicare Other | Source: Ambulatory Visit | Attending: Internal Medicine | Admitting: Internal Medicine

## 2017-12-22 DIAGNOSIS — M79643 Pain in unspecified hand: Secondary | ICD-10-CM | POA: Diagnosis not present

## 2017-12-22 DIAGNOSIS — M25511 Pain in right shoulder: Secondary | ICD-10-CM | POA: Diagnosis not present

## 2017-12-22 DIAGNOSIS — M1990 Unspecified osteoarthritis, unspecified site: Secondary | ICD-10-CM | POA: Diagnosis not present

## 2017-12-22 DIAGNOSIS — Z1231 Encounter for screening mammogram for malignant neoplasm of breast: Secondary | ICD-10-CM | POA: Diagnosis not present

## 2017-12-22 DIAGNOSIS — M25539 Pain in unspecified wrist: Secondary | ICD-10-CM | POA: Diagnosis not present

## 2017-12-22 DIAGNOSIS — R21 Rash and other nonspecific skin eruption: Secondary | ICD-10-CM | POA: Diagnosis not present

## 2017-12-22 DIAGNOSIS — M0579 Rheumatoid arthritis with rheumatoid factor of multiple sites without organ or systems involvement: Secondary | ICD-10-CM | POA: Diagnosis not present

## 2017-12-22 DIAGNOSIS — Z79899 Other long term (current) drug therapy: Secondary | ICD-10-CM | POA: Diagnosis not present

## 2017-12-22 DIAGNOSIS — R252 Cramp and spasm: Secondary | ICD-10-CM | POA: Diagnosis not present

## 2017-12-22 DIAGNOSIS — M47812 Spondylosis without myelopathy or radiculopathy, cervical region: Secondary | ICD-10-CM | POA: Diagnosis not present

## 2018-02-02 DIAGNOSIS — I10 Essential (primary) hypertension: Secondary | ICD-10-CM | POA: Diagnosis not present

## 2018-02-02 DIAGNOSIS — E1165 Type 2 diabetes mellitus with hyperglycemia: Secondary | ICD-10-CM | POA: Diagnosis not present

## 2018-02-02 DIAGNOSIS — E1140 Type 2 diabetes mellitus with diabetic neuropathy, unspecified: Secondary | ICD-10-CM | POA: Diagnosis not present

## 2018-02-09 DIAGNOSIS — R110 Nausea: Secondary | ICD-10-CM | POA: Diagnosis not present

## 2018-02-09 DIAGNOSIS — E1142 Type 2 diabetes mellitus with diabetic polyneuropathy: Secondary | ICD-10-CM | POA: Diagnosis not present

## 2018-02-09 DIAGNOSIS — K760 Fatty (change of) liver, not elsewhere classified: Secondary | ICD-10-CM | POA: Diagnosis not present

## 2018-02-09 DIAGNOSIS — M545 Low back pain: Secondary | ICD-10-CM | POA: Diagnosis not present

## 2018-02-09 DIAGNOSIS — E1165 Type 2 diabetes mellitus with hyperglycemia: Secondary | ICD-10-CM | POA: Diagnosis not present

## 2018-02-09 DIAGNOSIS — I10 Essential (primary) hypertension: Secondary | ICD-10-CM | POA: Diagnosis not present

## 2018-02-16 DIAGNOSIS — I10 Essential (primary) hypertension: Secondary | ICD-10-CM | POA: Diagnosis not present

## 2018-02-16 DIAGNOSIS — M545 Low back pain: Secondary | ICD-10-CM | POA: Diagnosis not present

## 2018-02-16 DIAGNOSIS — E1142 Type 2 diabetes mellitus with diabetic polyneuropathy: Secondary | ICD-10-CM | POA: Diagnosis not present

## 2018-02-16 DIAGNOSIS — K760 Fatty (change of) liver, not elsewhere classified: Secondary | ICD-10-CM | POA: Diagnosis not present

## 2018-02-16 DIAGNOSIS — E1165 Type 2 diabetes mellitus with hyperglycemia: Secondary | ICD-10-CM | POA: Diagnosis not present

## 2018-02-16 DIAGNOSIS — R110 Nausea: Secondary | ICD-10-CM | POA: Diagnosis not present

## 2018-02-28 DIAGNOSIS — M47812 Spondylosis without myelopathy or radiculopathy, cervical region: Secondary | ICD-10-CM | POA: Diagnosis not present

## 2018-02-28 DIAGNOSIS — R252 Cramp and spasm: Secondary | ICD-10-CM | POA: Diagnosis not present

## 2018-02-28 DIAGNOSIS — M1990 Unspecified osteoarthritis, unspecified site: Secondary | ICD-10-CM | POA: Diagnosis not present

## 2018-02-28 DIAGNOSIS — M25511 Pain in right shoulder: Secondary | ICD-10-CM | POA: Diagnosis not present

## 2018-02-28 DIAGNOSIS — Z79899 Other long term (current) drug therapy: Secondary | ICD-10-CM | POA: Diagnosis not present

## 2018-02-28 DIAGNOSIS — M0579 Rheumatoid arthritis with rheumatoid factor of multiple sites without organ or systems involvement: Secondary | ICD-10-CM | POA: Diagnosis not present

## 2018-02-28 DIAGNOSIS — R21 Rash and other nonspecific skin eruption: Secondary | ICD-10-CM | POA: Diagnosis not present

## 2018-02-28 DIAGNOSIS — M79643 Pain in unspecified hand: Secondary | ICD-10-CM | POA: Diagnosis not present

## 2018-03-14 DIAGNOSIS — E1165 Type 2 diabetes mellitus with hyperglycemia: Secondary | ICD-10-CM | POA: Diagnosis not present

## 2018-03-14 DIAGNOSIS — Z9889 Other specified postprocedural states: Secondary | ICD-10-CM | POA: Diagnosis not present

## 2018-03-14 DIAGNOSIS — S40862A Insect bite (nonvenomous) of left upper arm, initial encounter: Secondary | ICD-10-CM | POA: Diagnosis not present

## 2018-03-14 DIAGNOSIS — M25511 Pain in right shoulder: Secondary | ICD-10-CM | POA: Diagnosis not present

## 2018-03-14 DIAGNOSIS — I10 Essential (primary) hypertension: Secondary | ICD-10-CM | POA: Diagnosis not present

## 2018-03-23 DIAGNOSIS — M25511 Pain in right shoulder: Secondary | ICD-10-CM | POA: Diagnosis not present

## 2018-03-30 DIAGNOSIS — M25811 Other specified joint disorders, right shoulder: Secondary | ICD-10-CM | POA: Diagnosis not present

## 2018-03-30 DIAGNOSIS — M25511 Pain in right shoulder: Secondary | ICD-10-CM | POA: Diagnosis not present

## 2018-04-11 DIAGNOSIS — G259 Extrapyramidal and movement disorder, unspecified: Secondary | ICD-10-CM | POA: Diagnosis not present

## 2018-04-11 DIAGNOSIS — I639 Cerebral infarction, unspecified: Secondary | ICD-10-CM | POA: Diagnosis not present

## 2018-04-11 DIAGNOSIS — M791 Myalgia, unspecified site: Secondary | ICD-10-CM | POA: Diagnosis not present

## 2018-04-11 DIAGNOSIS — R5383 Other fatigue: Secondary | ICD-10-CM | POA: Diagnosis not present

## 2018-04-13 ENCOUNTER — Other Ambulatory Visit: Payer: Self-pay | Admitting: Internal Medicine

## 2018-04-13 DIAGNOSIS — R5383 Other fatigue: Secondary | ICD-10-CM

## 2018-04-13 DIAGNOSIS — I639 Cerebral infarction, unspecified: Secondary | ICD-10-CM

## 2018-04-26 ENCOUNTER — Ambulatory Visit (INDEPENDENT_AMBULATORY_CARE_PROVIDER_SITE_OTHER): Payer: Medicare Other | Admitting: Cardiology

## 2018-04-26 ENCOUNTER — Encounter: Payer: Self-pay | Admitting: Cardiology

## 2018-04-26 VITALS — BP 140/74 | HR 95 | Ht 68.0 in | Wt 232.4 lb

## 2018-04-26 DIAGNOSIS — R011 Cardiac murmur, unspecified: Secondary | ICD-10-CM

## 2018-04-26 DIAGNOSIS — I639 Cerebral infarction, unspecified: Secondary | ICD-10-CM

## 2018-04-26 DIAGNOSIS — Z01818 Encounter for other preprocedural examination: Secondary | ICD-10-CM | POA: Diagnosis not present
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
## 2018-04-26 NOTE — Patient Instructions (Addendum)
Medication Instructions:   Your physician recommends that you continue on your current medications as directed. Please refer to the Current Medication list given to you today.   If you need a refill on your cardiac medications before your next appointment, please call your pharmacy.   Labwork: NONE ORDERED  TODAY'    Testing/Procedures:  Your physician has requested that you have an echocardiogram. Echocardiography is a painless test that uses sound waves to create images of your heart. It provides your doctor with information about the size and shape of your heart and how well your heart's chambers and valves are working. This procedure takes approximately one hour. There are no restrictions for this procedure.     Follow-Up: Your physician wants you to follow-up in: [place] will receive a reminder letter in the mail two months in advance. If you don't receive a letter, please call our office to schedule the follow-up appointment.     Any Other Special Instructions Will Be Listed Below (If Applicable).

## 2018-04-27 ENCOUNTER — Ambulatory Visit
Admission: RE | Admit: 2018-04-27 | Discharge: 2018-04-27 | Disposition: A | Discharge status: Home / Self Care | Payer: Medicare Other | Source: Ambulatory Visit | Attending: Internal Medicine | Admitting: Internal Medicine

## 2018-04-27 DIAGNOSIS — R5383 Other fatigue: Secondary | ICD-10-CM

## 2018-04-27 DIAGNOSIS — R531 Weakness: Secondary | ICD-10-CM | POA: Diagnosis not present

## 2018-04-27 DIAGNOSIS — I639 Cerebral infarction, unspecified: Secondary | ICD-10-CM

## 2018-04-27 IMAGING — CT CT HEAD W/O CM
4 series · 16 of 47 positions shown, 18 images · non-contrast
Comparison: 10/19/2013 brain MR and 12/23/2009 head CT

CLINICAL DATA: 72-year-old female with weakness and poor movement.
Fatigue for 3 weeks.

EXAM:
CT HEAD WITHOUT CONTRAST
TECHNIQUE: Contiguous axial images were obtained from the base of the skull
through the vertex without intravenous contrast.

[Series 2: head 5.00 hr40 s3 ibhc · axial · 0.44mm/px · z∈[-491,-371]mm · 7 of 33 slices shown, 9 images]
[im 5/33  brain]
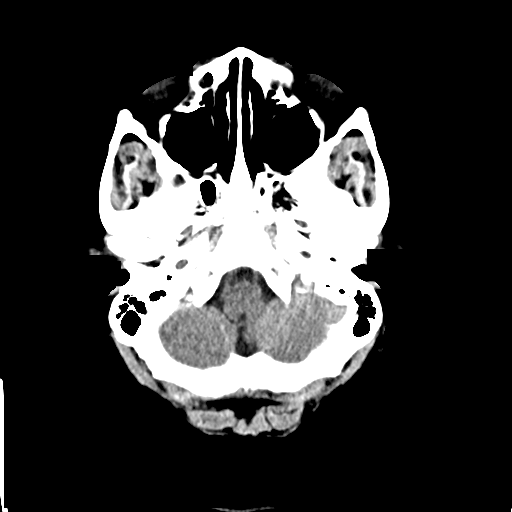
[im 5/33  bone]
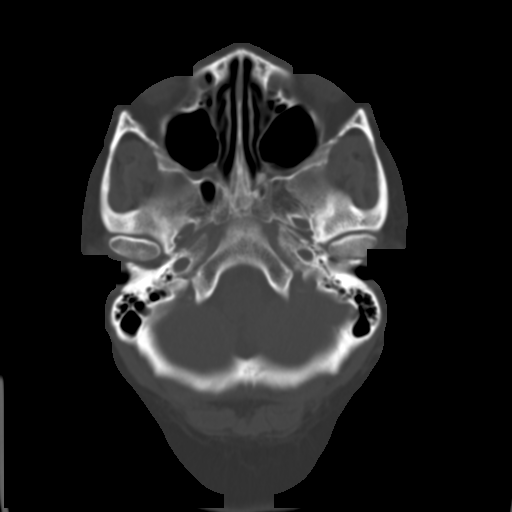
[im 9/33  brain]
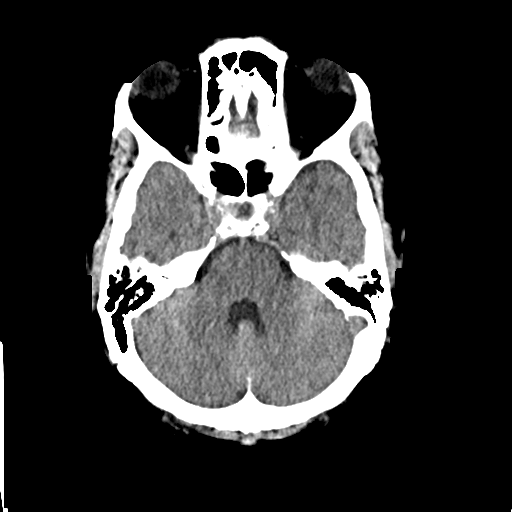
[im 13/33  brain]
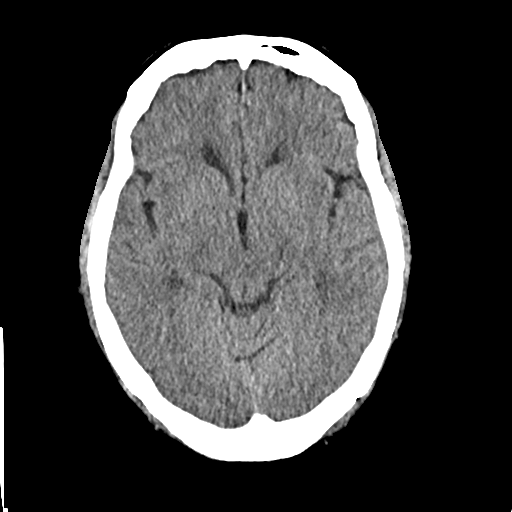
[im 17/33  brain]
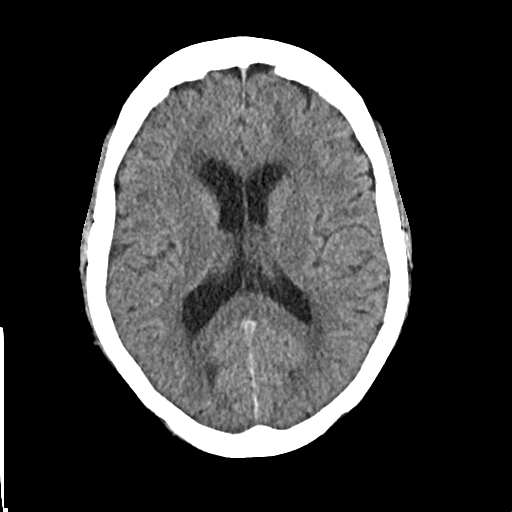
[im 21/33  brain]
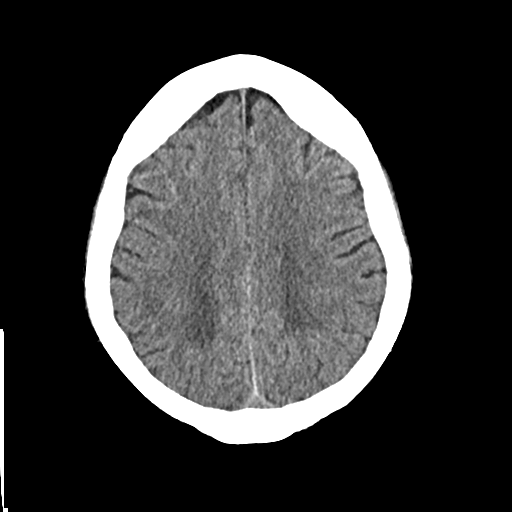
[im 21/33  bone]
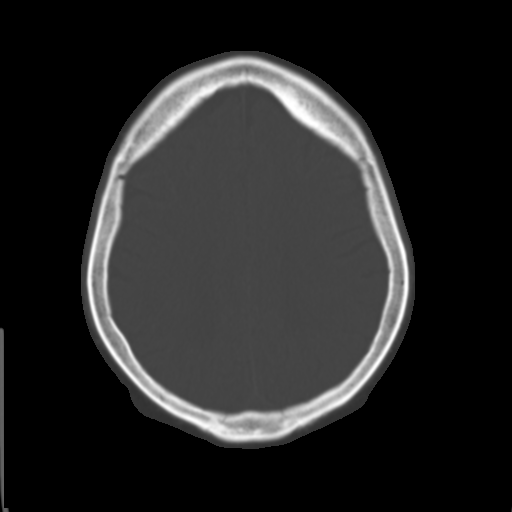
[im 25/33  brain]
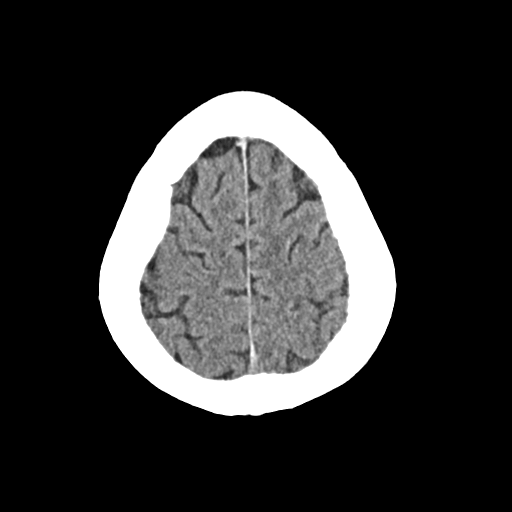
[im 29/33  brain]
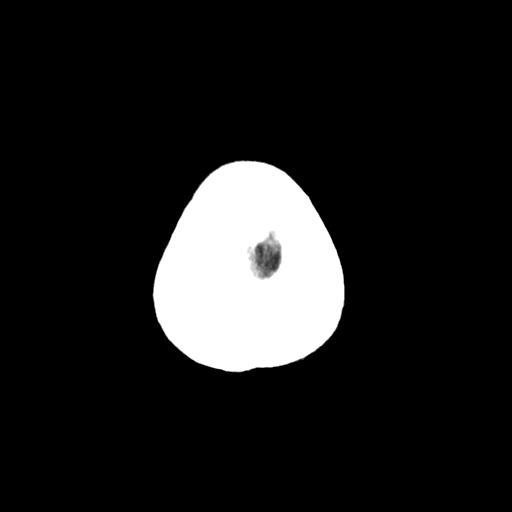

[Series 3: head 2.00 hr60 s3 bone · axial · 0.44mm/px · z∈[-496,-464]mm · 3 of 83 slices shown]
[im 9/83  bone]
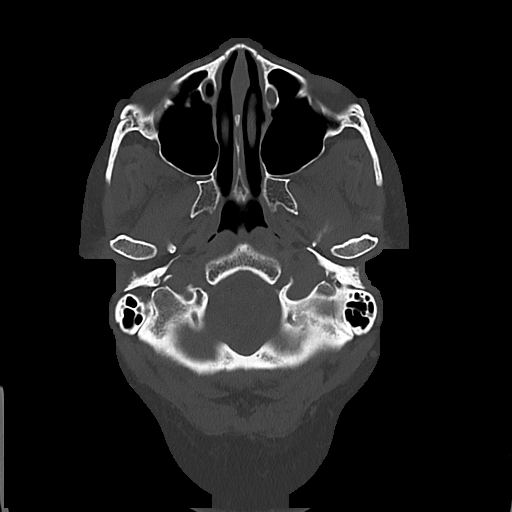
[im 17/83  bone]
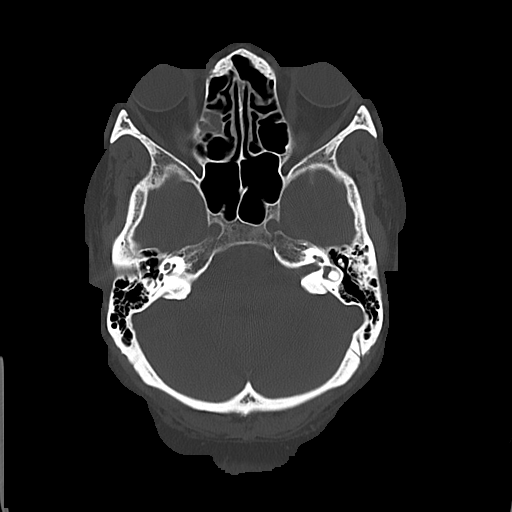
[im 25/83  bone]
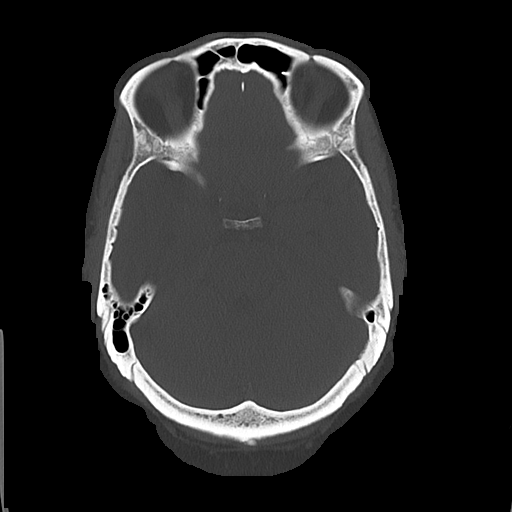

[Series 4: head 3.00 hr40 s3 sag · sagittal · 0.31mm/px · 3 of 57 slices shown]
[im 19/57  brain]
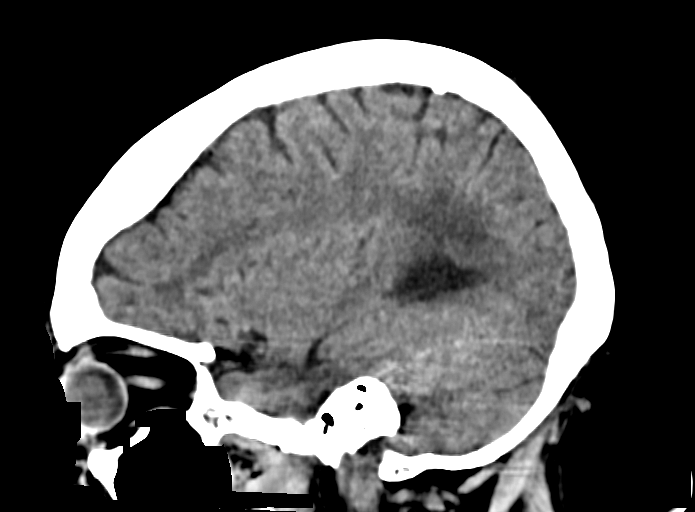
[im 29/57  brain]
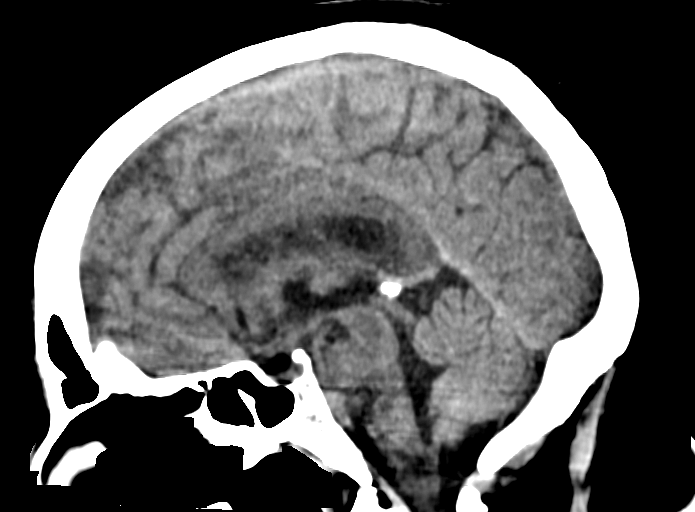
[im 38/57  brain]
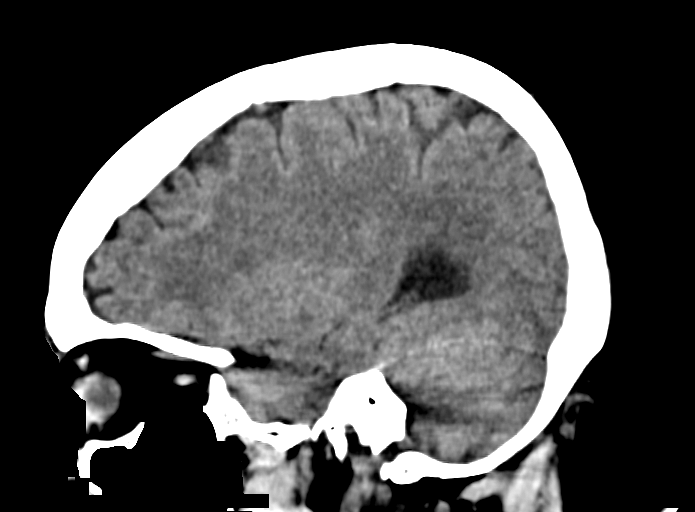

[Series 6: head 3.00 hr40 s3 cor · coronal · 0.31mm/px · 3 of 72 slices shown]
[im 24/72  brain]
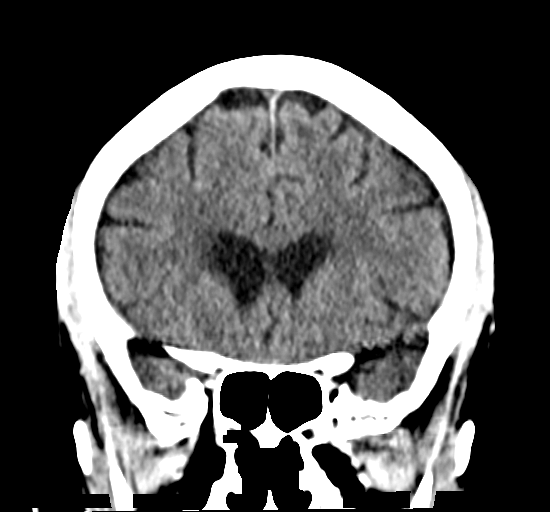
[im 32/72  brain]
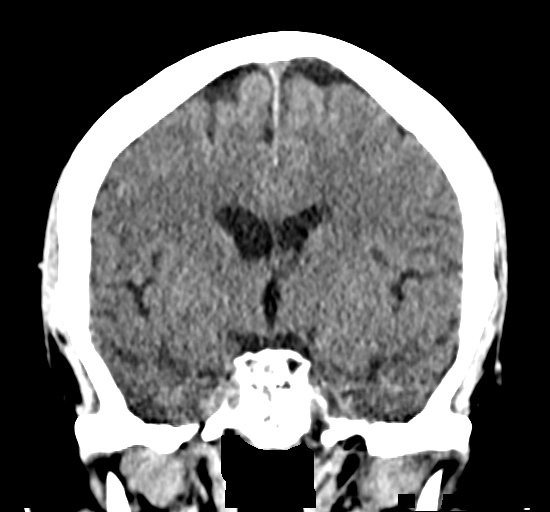
[im 40/72  brain]
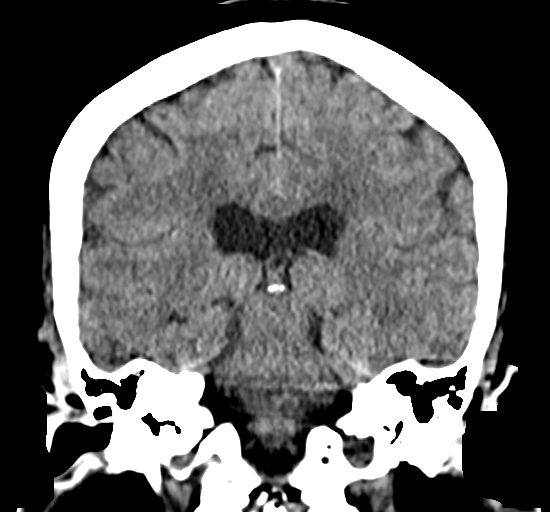

[16 of 47 positions shown; findings below may reference images not displayed]

FINDINGS: Brain: No evidence of acute infarction, hemorrhage, hydrocephalus,
extra-axial collection or mass lesion/mass effect.

Chronic small-vessel white matter ischemic changes are again noted.

Vascular: Intracranial atherosclerotic calcifications again noted.

Skull: Normal. Negative for fracture or focal lesion.

Sinuses/Orbits: Opacified mid RIGHT ethmoid air cells are noted. No
other abnormalities identified.

Other: None
IMPRESSION: 1. No evidence of acute intracranial abnormality
2. Chronic small-vessel white matter ischemic changes
3. Opacified mid RIGHT ethmoid air cells/sinus disease.

## 2018-05-01 DIAGNOSIS — J322 Chronic ethmoidal sinusitis: Secondary | ICD-10-CM | POA: Diagnosis not present

## 2018-05-01 DIAGNOSIS — R1084 Generalized abdominal pain: Secondary | ICD-10-CM | POA: Diagnosis not present

## 2018-05-01 DIAGNOSIS — M791 Myalgia, unspecified site: Secondary | ICD-10-CM | POA: Diagnosis not present

## 2018-05-01 DIAGNOSIS — R5383 Other fatigue: Secondary | ICD-10-CM | POA: Diagnosis not present

## 2018-05-01 DIAGNOSIS — G259 Extrapyramidal and movement disorder, unspecified: Secondary | ICD-10-CM | POA: Diagnosis not present

## 2018-05-02 ENCOUNTER — Ambulatory Visit (HOSPITAL_COMMUNITY): Payer: Medicare Other | Attending: Cardiovascular Disease

## 2018-05-02 ENCOUNTER — Other Ambulatory Visit: Payer: Self-pay

## 2018-05-02 DIAGNOSIS — Z01818 Encounter for other preprocedural examination: Secondary | ICD-10-CM | POA: Diagnosis not present

## 2018-05-02 DIAGNOSIS — I10 Essential (primary) hypertension: Secondary | ICD-10-CM | POA: Diagnosis not present

## 2018-05-02 DIAGNOSIS — R011 Cardiac murmur, unspecified: Secondary | ICD-10-CM

## 2018-05-02 DIAGNOSIS — E119 Type 2 diabetes mellitus without complications: Secondary | ICD-10-CM | POA: Diagnosis not present

## 2018-05-03 ENCOUNTER — Telehealth: Payer: Self-pay | Admitting: Cardiology

## 2018-05-03 NOTE — Telephone Encounter (Signed)
New message ° ° °Please call with echo results °

## 2018-05-03 NOTE — Telephone Encounter (Signed)
Spoke to the patient with Echo results and recommendations. She verbalized understanding.

## 2018-05-12 DIAGNOSIS — E1165 Type 2 diabetes mellitus with hyperglycemia: Secondary | ICD-10-CM | POA: Diagnosis not present

## 2018-05-12 DIAGNOSIS — I10 Essential (primary) hypertension: Secondary | ICD-10-CM | POA: Diagnosis not present

## 2018-05-15 DIAGNOSIS — K760 Fatty (change of) liver, not elsewhere classified: Secondary | ICD-10-CM | POA: Diagnosis not present

## 2018-05-15 DIAGNOSIS — E1165 Type 2 diabetes mellitus with hyperglycemia: Secondary | ICD-10-CM | POA: Diagnosis not present

## 2018-05-15 DIAGNOSIS — Z01818 Encounter for other preprocedural examination: Secondary | ICD-10-CM | POA: Diagnosis not present

## 2018-05-15 DIAGNOSIS — I10 Essential (primary) hypertension: Secondary | ICD-10-CM | POA: Diagnosis not present
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
## 2018-05-24 DIAGNOSIS — H2511 Age-related nuclear cataract, right eye: Secondary | ICD-10-CM | POA: Diagnosis not present

## 2018-05-29 ENCOUNTER — Encounter (HOSPITAL_COMMUNITY): Payer: Self-pay

## 2018-05-29 ENCOUNTER — Encounter (HOSPITAL_COMMUNITY)
Admission: RE | Admit: 2018-05-29 | Discharge: 2018-05-29 | Disposition: A | Discharge status: Home / Self Care | Payer: Medicare Other | Source: Ambulatory Visit | Attending: Orthopedic Surgery | Admitting: Orthopedic Surgery

## 2018-05-29 ENCOUNTER — Other Ambulatory Visit: Payer: Self-pay

## 2018-05-29 DIAGNOSIS — M12811 Other specific arthropathies, not elsewhere classified, right shoulder: Secondary | ICD-10-CM | POA: Diagnosis not present

## 2018-05-29 DIAGNOSIS — Z9049 Acquired absence of other specified parts of digestive tract: Secondary | ICD-10-CM | POA: Diagnosis not present

## 2018-05-29 DIAGNOSIS — I350 Nonrheumatic aortic (valve) stenosis: Secondary | ICD-10-CM | POA: Diagnosis not present

## 2018-05-29 DIAGNOSIS — Z79899 Other long term (current) drug therapy: Secondary | ICD-10-CM | POA: Diagnosis not present

## 2018-05-29 DIAGNOSIS — Z7984 Long term (current) use of oral hypoglycemic drugs: Secondary | ICD-10-CM | POA: Insufficient documentation

## 2018-05-29 DIAGNOSIS — Z7982 Long term (current) use of aspirin: Secondary | ICD-10-CM | POA: Insufficient documentation

## 2018-05-29 DIAGNOSIS — I10 Essential (primary) hypertension: Secondary | ICD-10-CM | POA: Insufficient documentation

## 2018-05-29 DIAGNOSIS — K219 Gastro-esophageal reflux disease without esophagitis: Secondary | ICD-10-CM | POA: Diagnosis not present

## 2018-05-29 DIAGNOSIS — Z0183 Encounter for blood typing: Secondary | ICD-10-CM | POA: Insufficient documentation

## 2018-05-29 DIAGNOSIS — Z01812 Encounter for preprocedural laboratory examination: Secondary | ICD-10-CM | POA: Diagnosis not present

## 2018-05-29 DIAGNOSIS — Z01818 Encounter for other preprocedural examination: Secondary | ICD-10-CM | POA: Diagnosis not present

## 2018-05-29 DIAGNOSIS — M069 Rheumatoid arthritis, unspecified: Secondary | ICD-10-CM | POA: Insufficient documentation

## 2018-05-29 DIAGNOSIS — Z90710 Acquired absence of both cervix and uterus: Secondary | ICD-10-CM | POA: Diagnosis not present

## 2018-05-29 DIAGNOSIS — E119 Type 2 diabetes mellitus without complications: Secondary | ICD-10-CM | POA: Diagnosis not present

## 2018-05-29 LAB — CBC
HCT: 37.1 % (ref 36.0–46.0)
Hemoglobin: 11.3 g/dL — ABNORMAL LOW (ref 12.0–15.0)
MCH: 26.8 pg (ref 26.0–34.0)
MCHC: 30.5 g/dL (ref 30.0–36.0)
MCV: 88.1 fL (ref 78.0–100.0)
Platelets: 297 10*3/uL (ref 150–400)
RBC: 4.21 MIL/uL (ref 3.87–5.11)
RDW: 17 % — ABNORMAL HIGH (ref 11.5–15.5)
WBC: 6.1 10*3/uL (ref 4.0–10.5)

## 2018-05-29 LAB — BASIC METABOLIC PANEL
Anion gap: 9 (ref 5–15)
BUN: 9 mg/dL (ref 8–23)
CALCIUM: 9 mg/dL (ref 8.9–10.3)
CHLORIDE: 109 mmol/L (ref 98–111)
CO2: 24 mmol/L (ref 22–32)
Creatinine, Ser: 0.92 mg/dL (ref 0.44–1.00)
GFR calc Af Amer: >60 mL/min (ref >60)
Glucose, Bld: 162 mg/dL — ABNORMAL HIGH (ref 70–99)
Potassium: 3.7 mmol/L (ref 3.5–5.1)
SODIUM: 142 mmol/L (ref 135–145)

## 2018-05-29 LAB — SURGICAL PCR SCREEN
MRSA, PCR: NEGATIVE
Staphylococcus aureus: NEGATIVE

## 2018-05-29 LAB — TYPE AND SCREEN
ABO/RH(D): B POS
Antibody Screen: NEGATIVE

## 2018-05-29 LAB — HEMOGLOBIN A1C
HEMOGLOBIN A1C: 6.9 % — AB (ref 4.8–5.6)
MEAN PLASMA GLUCOSE: 151.33 mg/dL

## 2018-05-29 LAB — GLUCOSE, CAPILLARY: GLUCOSE-CAPILLARY: 147 mg/dL — AB (ref 70–99)

## 2018-05-29 LAB — ABO/RH: ABO/RH(D): B POS

## 2018-05-29 NOTE — Pre-Procedure Instructions (Addendum)
Kyra Laffey Rivkin  05/29/2018      [id number] [address], [place] [address] Phone: [phone] Fax: [phone]    Your procedure is scheduled on June 02, 2018.  Report to [facility] Admitting at 530 AM.  Call this number if you have problems the morning of surgery:  [phone]   Remember:  Do not eat or drink after midnight.    Take these medicines the morning of surgery with A SIP OF WATER  Duloxetine (cymbalta) Fluticasone (flonase)-if needed Gabapentin (Neurontin) Loratadine (claritin) Oxycodone-if needed for pain Omeprazole (prilosec)  Follow your surgeon's instructions on when to hold/resume aspirin.  If no instructions were given call the office to determine how they would like to you take aspirin  7 days prior to surgery STOP taking any Aleve, Naproxen, Ibuprofen, Motrin, Advil, Goody's, BC's, all herbal medications, fish oil, and all vitamins     WHAT DO I DO ABOUT MY DIABETES MEDICATION?   Marland Kitchen Do not take oral diabetes medicines (pills) the morning of surgery- metformin (glucophage).  . The day of surgery, do not take other diabetes injectables, including Byetta (exenatide), Bydureon (exenatide ER), Victoza (liraglutide), or Trulicity (dulaglutide).   Reviewed and Endorsed by [facility] Patient Education Committee, August 2015  How to Manage Your Diabetes Before and After Surgery  Why is it important to control my blood sugar before and after surgery? . Improving blood sugar levels before and after surgery helps healing and can limit problems. . A way of improving blood sugar control is eating a healthy diet by: o  Eating less sugar and carbohydrates o  Increasing activity/exercise o  Talking with your doctor about reaching your blood sugar goals . High blood sugars (greater than 180 mg/dL) can raise your risk of infections and slow  your recovery, so you will need to focus on controlling your diabetes during the weeks before surgery. . Make sure that the doctor who takes care of your diabetes knows about your planned surgery including the date and location.  How do I manage my blood sugar before surgery? . Check your blood sugar at least 4 times a day, starting 2 days before surgery, to make sure that the level is not too high or low. o Check your blood sugar the morning of your surgery when you wake up and every 2 hours until you get to the Short Stay unit. . If your blood sugar is less than 70 mg/dL, you will need to treat for low blood sugar: o Do not take insulin. o Treat a low blood sugar (less than 70 mg/dL) with  cup of clear juice (cranberry or apple), 4 glucose tablets, OR glucose gel. Recheck blood sugar in 15 minutes after treatment (to make sure it is greater than 70 mg/dL). If your blood sugar is not greater than 70 mg/dL on recheck, call [phone] o  for further instructions. . Report your blood sugar to the short stay nurse when you get to Short Stay.  . If you are admitted to the hospital after surgery: o Your blood sugar will be checked by the staff and you will probably be given insulin after surgery (instead of oral diabetes medicines) to make sure you have good blood sugar levels. o The goal for blood sugar control after surgery is 80-180 mg/dL.    Do not wear jewelry,  make-up or nail polish.  Do not wear lotions, powders, or perfumes, or deodorant.  Do not shave 48 hours prior to surgery.    Do not bring valuables to the hospital.  [facility] is not responsible for any belongings or valuables.  Contacts, dentures or bridgework may not be worn into surgery.  Leave your suitcase in the car.  After surgery it may be brought to your room.  For patients admitted to the hospital, discharge time will be determined by your treatment team.  Patients discharged the day of surgery will not be allowed  to drive home.    [place]- Preparing For Surgery  Before surgery, you can play an important role. Because skin is not sterile, your skin needs to be as free of germs as possible. You can reduce the number of germs on your skin by washing with CHG (chlorahexidine gluconate) Soap before surgery.  CHG is an antiseptic cleaner which kills germs and bonds with the skin to continue killing germs even after washing.    Oral Hygiene is also important to reduce your risk of infection.  Remember - BRUSH YOUR TEETH THE MORNING OF SURGERY WITH YOUR REGULAR TOOTHPASTE  Please do not use if you have an allergy to CHG or antibacterial soaps. If your skin becomes reddened/irritated stop using the CHG.  Do not shave (including legs and underarms) for at least 48 hours prior to first CHG shower. It is OK to shave your face.  Please follow these instructions carefully.   1. Shower the NIGHT BEFORE SURGERY and the MORNING OF SURGERY with CHG.   2. If you chose to wash your hair, wash your hair first as usual with your normal shampoo.  3. After you shampoo, rinse your hair and body thoroughly to remove the shampoo.  4. Use CHG as you would any other liquid soap. You can apply CHG directly to the skin and wash gently with a scrungie or a clean washcloth.   5. Apply the CHG Soap to your body ONLY FROM THE NECK DOWN.  Do not use on open wounds or open sores. Avoid contact with your eyes, ears, mouth and genitals (private parts). Wash Face and genitals (private parts)  with your normal soap.  6. Wash thoroughly, paying special attention to the area where your surgery will be performed.  7. Thoroughly rinse your body with warm water from the neck down.  8. DO NOT shower/wash with your normal soap after using and rinsing off the CHG Soap.  9. Pat yourself dry with a CLEAN TOWEL.  10. Wear CLEAN PAJAMAS to bed the night before surgery, wear comfortable clothes the morning of surgery  11. Place CLEAN SHEETS  on your bed the night of your first shower and DO NOT SLEEP WITH PETS.  Day of Surgery:  Do not apply any deodorants/lotions.  Please wear clean clothes to the hospital/surgery center.   Remember to brush your teeth WITH YOUR REGULAR TOOTHPASTE.  Please read over the following fact sheets that you were given. Pain Booklet, Coughing and Deep Breathing and Surgical Site Infection Prevention

## 2018-05-29 NOTE — Progress Notes (Addendum)
PCP: Vianne Bulls, MD  Cardiologist: Mardi Mainland  EKG: 04/26/18 in EPIC  Stress test: 04/20/18 in EPIC  ECHO: 05/02/18 in Epic and on chart  Cardiac Cath: 04/21/17  Chest x-ray: pt denies past year, no recent respiratory complications
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
## 2018-06-02 ENCOUNTER — Other Ambulatory Visit: Payer: Self-pay

## 2018-06-02 ENCOUNTER — Inpatient Hospital Stay (HOSPITAL_COMMUNITY)
Admission: RE | Admit: 2018-06-02 | Discharge: 2018-06-04 | DRG: 483 | Disposition: A | Discharge status: Home / Self Care | Payer: Medicare Other | Attending: Orthopedic Surgery | Admitting: Orthopedic Surgery

## 2018-06-02 ENCOUNTER — Encounter (HOSPITAL_COMMUNITY): Payer: Self-pay | Admitting: Anesthesiology

## 2018-06-02 ENCOUNTER — Inpatient Hospital Stay (HOSPITAL_COMMUNITY): Payer: Medicare Other | Admitting: Anesthesiology

## 2018-06-02 ENCOUNTER — Encounter (HOSPITAL_COMMUNITY)
Admission: RE | Disposition: A | Discharge status: Home / Self Care | Payer: Self-pay | Source: Home / Self Care | Attending: Orthopedic Surgery

## 2018-06-02 ENCOUNTER — Inpatient Hospital Stay (HOSPITAL_COMMUNITY): Payer: Medicare Other

## 2018-06-02 ENCOUNTER — Inpatient Hospital Stay (HOSPITAL_COMMUNITY): Payer: Medicare Other | Admitting: Physician Assistant

## 2018-06-02 DIAGNOSIS — Z881 Allergy status to other antibiotic agents status: Secondary | ICD-10-CM

## 2018-06-02 DIAGNOSIS — M25511 Pain in right shoulder: Secondary | ICD-10-CM | POA: Diagnosis not present

## 2018-06-02 DIAGNOSIS — Z7984 Long term (current) use of oral hypoglycemic drugs: Secondary | ICD-10-CM

## 2018-06-02 DIAGNOSIS — Z7982 Long term (current) use of aspirin: Secondary | ICD-10-CM

## 2018-06-02 DIAGNOSIS — I10 Essential (primary) hypertension: Secondary | ICD-10-CM | POA: Diagnosis not present

## 2018-06-02 DIAGNOSIS — M75101 Unspecified rotator cuff tear or rupture of right shoulder, not specified as traumatic: Secondary | ICD-10-CM | POA: Diagnosis present

## 2018-06-02 DIAGNOSIS — M25811 Other specified joint disorders, right shoulder: Secondary | ICD-10-CM | POA: Diagnosis not present

## 2018-06-02 DIAGNOSIS — Z6837 Body mass index (BMI) 37.0-37.9, adult: Secondary | ICD-10-CM | POA: Diagnosis not present

## 2018-06-02 DIAGNOSIS — Z885 Allergy status to narcotic agent status: Secondary | ICD-10-CM

## 2018-06-02 DIAGNOSIS — E119 Type 2 diabetes mellitus without complications: Secondary | ICD-10-CM | POA: Diagnosis present

## 2018-06-02 DIAGNOSIS — M12811 Other specific arthropathies, not elsewhere classified, right shoulder: Secondary | ICD-10-CM | POA: Diagnosis not present

## 2018-06-02 DIAGNOSIS — K219 Gastro-esophageal reflux disease without esophagitis: Secondary | ICD-10-CM | POA: Diagnosis present

## 2018-06-02 DIAGNOSIS — Z79899 Other long term (current) drug therapy: Secondary | ICD-10-CM | POA: Diagnosis not present

## 2018-06-02 DIAGNOSIS — Z96611 Presence of right artificial shoulder joint: Secondary | ICD-10-CM

## 2018-06-02 DIAGNOSIS — M19011 Primary osteoarthritis, right shoulder: Secondary | ICD-10-CM | POA: Diagnosis not present

## 2018-06-02 DIAGNOSIS — G8918 Other acute postprocedural pain: Secondary | ICD-10-CM | POA: Diagnosis not present

## 2018-06-02 DIAGNOSIS — Z471 Aftercare following joint replacement surgery: Secondary | ICD-10-CM | POA: Diagnosis not present

## 2018-06-02 HISTORY — DX: Unspecified cataract: H26.9

## 2018-06-02 HISTORY — PX: REVERSE SHOULDER ARTHROPLASTY: SHX5054

## 2018-06-02 LAB — GLUCOSE, CAPILLARY
GLUCOSE-CAPILLARY: 147 mg/dL — AB (ref 70–99)
Glucose-Capillary: 162 mg/dL — ABNORMAL HIGH (ref 70–99)

## 2018-06-02 IMAGING — DX DG SHOULDER 2+V PORT*R*
1 series · 1 of 1 positions shown · non-contrast
Comparison: Radiographs May 27, 2016.

CLINICAL DATA: Status post right shoulder replacement.

EXAM:
PORTABLE RIGHT SHOULDER

[shoulder ap]
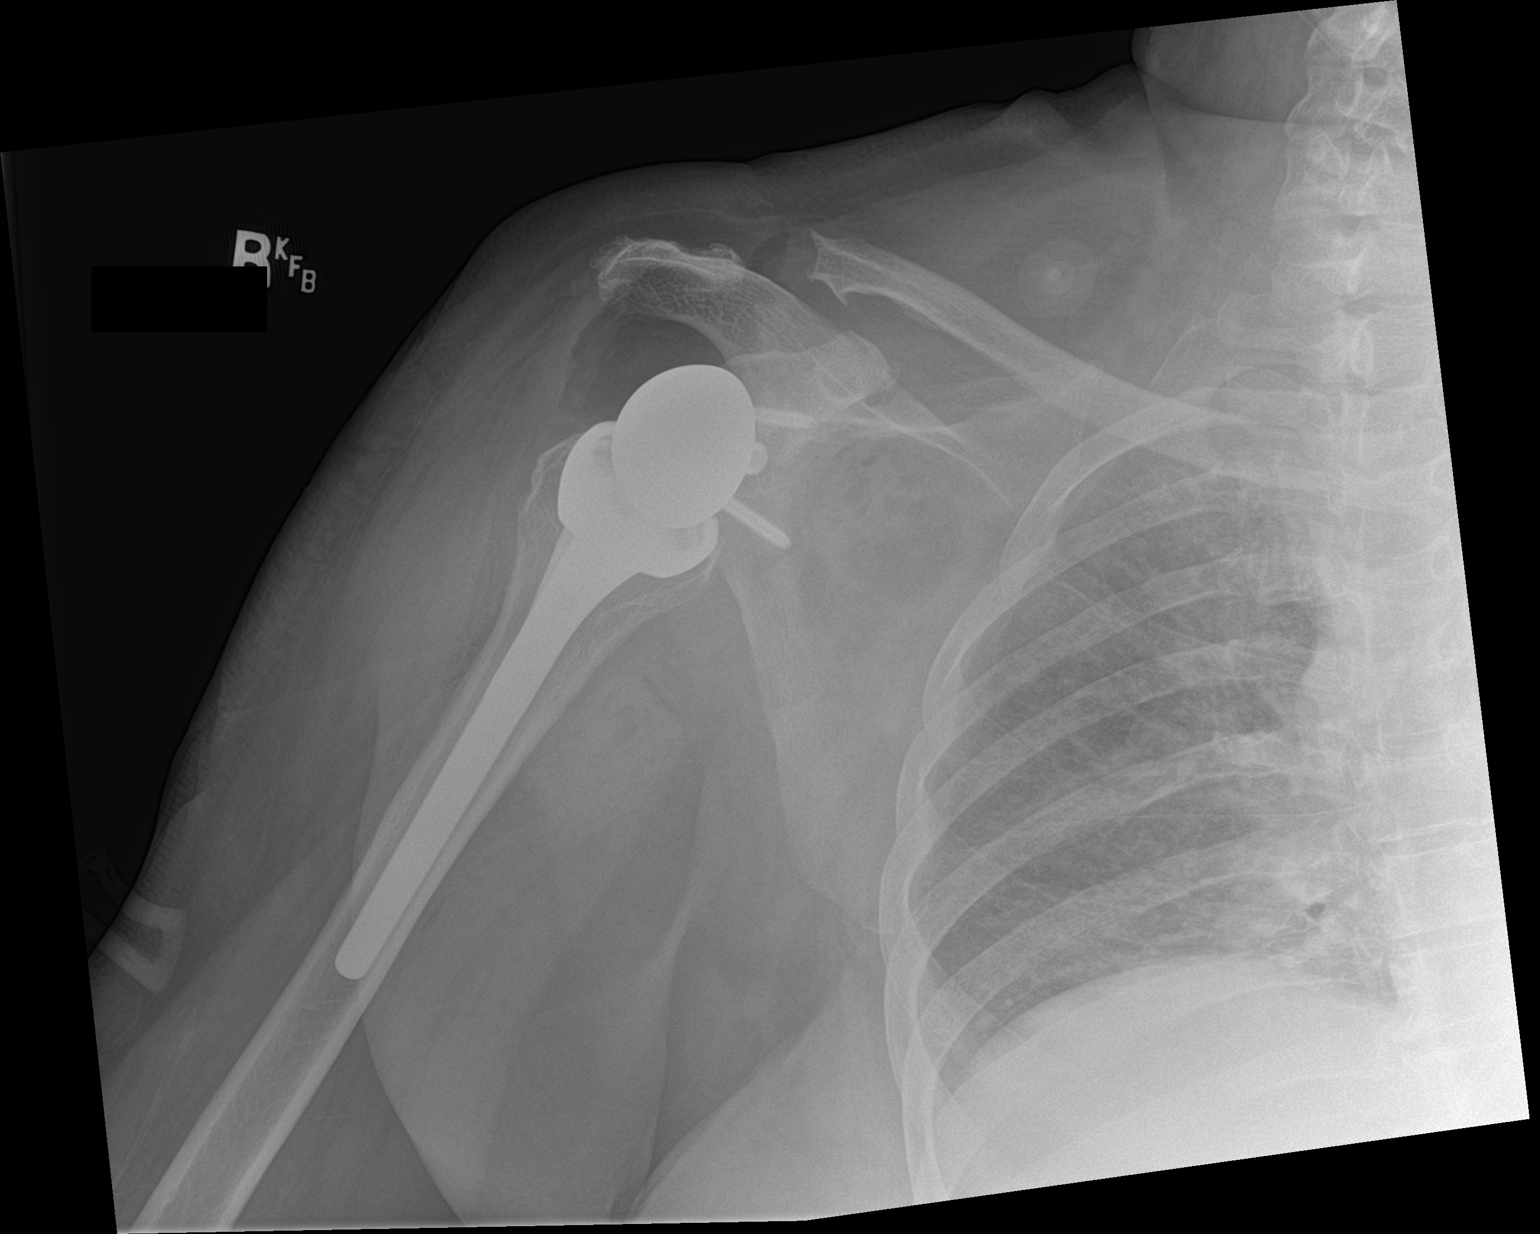

[1 of 1 positions shown; findings below may reference images not displayed]

FINDINGS: The glenoid and humeral components appear to be well situated. No
fracture or dislocation is noted. Expected postoperative changes are
seen in the surrounding soft tissues. Status post resection of
distal right clavicle.
IMPRESSION: Status post right shoulder arthroplasty.

## 2018-06-02 SURGERY — ARTHROPLASTY, SHOULDER, TOTAL, REVERSE
Anesthesia: Regional | Laterality: Right

## 2018-06-02 MED ORDER — ONDANSETRON HCL 4 MG/2ML IJ SOLN
INTRAMUSCULAR | Status: DC | PRN
  Administered 2018-06-02: 4 mg via INTRAVENOUS

## 2018-06-02 MED ORDER — BUPIVACAINE-EPINEPHRINE (PF) 0.25% -1:200000 IJ SOLN
INTRAMUSCULAR | Status: AC
  Filled 2018-06-02: qty 30

## 2018-06-02 MED ORDER — BUPIVACAINE-EPINEPHRINE (PF) 0.5% -1:200000 IJ SOLN
INTRAMUSCULAR | Status: DC | PRN
  Administered 2018-06-02: 30 mL via PERINEURAL

## 2018-06-02 MED ORDER — PHENYLEPHRINE HCL 10 MG/ML IJ SOLN
INTRAMUSCULAR | Status: DC | PRN
  Administered 2018-06-02: 25 ug/min via INTRAVENOUS

## 2018-06-02 MED ORDER — SUGAMMADEX SODIUM 200 MG/2ML IV SOLN
INTRAVENOUS | Status: DC | PRN
  Administered 2018-06-02: 200 mg via INTRAVENOUS

## 2018-06-02 MED ORDER — FENTANYL CITRATE (PF) 250 MCG/5ML IJ SOLN
INTRAMUSCULAR | Status: AC
  Filled 2018-06-02: qty 5

## 2018-06-02 MED ORDER — FENTANYL CITRATE (PF) 100 MCG/2ML IJ SOLN
INTRAMUSCULAR | Status: DC | PRN
  Administered 2018-06-02 (×2): 50 ug via INTRAVENOUS

## 2018-06-02 MED ORDER — ONDANSETRON HCL 4 MG PO TABS
4.0000 mg | ORAL_TABLET | Freq: Two times a day (BID) | ORAL | Status: DC
  Administered 2018-06-02 – 2018-06-04 (×3): 4 mg via ORAL
  Filled 2018-06-02 (×4): qty 1

## 2018-06-02 MED ORDER — LIDOCAINE HCL (CARDIAC) PF 100 MG/5ML IV SOSY
PREFILLED_SYRINGE | INTRAVENOUS | Status: DC | PRN
  Administered 2018-06-02: 40 mg via INTRAVENOUS

## 2018-06-02 MED ORDER — HYDROXYCHLOROQUINE SULFATE 200 MG PO TABS
200.0000 mg | ORAL_TABLET | Freq: Every day | ORAL | Status: DC
  Administered 2018-06-03 – 2018-06-04 (×2): 200 mg via ORAL
  Filled 2018-06-02 (×2): qty 1

## 2018-06-02 MED ORDER — ACETAMINOPHEN 325 MG PO TABS
325.0000 mg | ORAL_TABLET | Freq: Four times a day (QID) | ORAL | Status: DC | PRN
  Administered 2018-06-03: 650 mg via ORAL
  Filled 2018-06-02: qty 2

## 2018-06-02 MED ORDER — CLINDAMYCIN PHOSPHATE 900 MG/50ML IV SOLN
INTRAVENOUS | Status: AC
  Filled 2018-06-02: qty 50

## 2018-06-02 MED ORDER — BISACODYL 10 MG RE SUPP: 10.0000 mg | Freq: Every day | RECTAL | Status: DC | PRN

## 2018-06-02 MED ORDER — IRBESARTAN 300 MG PO TABS
300.0000 mg | ORAL_TABLET | Freq: Every day | ORAL | Status: DC
Start: 2018-06-02 — End: 2018-06-04
  Administered 2018-06-03 – 2018-06-04 (×2): 300 mg via ORAL
  Filled 2018-06-02 (×2): qty 1

## 2018-06-02 MED ORDER — DEXAMETHASONE SODIUM PHOSPHATE 10 MG/ML IJ SOLN
INTRAMUSCULAR | Status: DC | PRN
  Administered 2018-06-02: 5 mg via INTRAVENOUS

## 2018-06-02 MED ORDER — METHOCARBAMOL 500 MG PO TABS: 500.0000 mg | ORAL_TABLET | Freq: Three times a day (TID) | ORAL | 1 refills | Status: DC | PRN

## 2018-06-02 MED ORDER — CHLORHEXIDINE GLUCONATE 4 % EX LIQD: 60.0000 mL | Freq: Once | CUTANEOUS | Status: DC

## 2018-06-02 MED ORDER — OXYCODONE HCL 5 MG PO TABS
5.0000 mg | ORAL_TABLET | ORAL | Status: DC | PRN
  Administered 2018-06-03 (×4): 10 mg via ORAL
  Filled 2018-06-02 (×4): qty 2

## 2018-06-02 MED ORDER — METHOCARBAMOL 500 MG PO TABS
500.0000 mg | ORAL_TABLET | Freq: Four times a day (QID) | ORAL | Status: DC | PRN
  Administered 2018-06-02 – 2018-06-03 (×2): 500 mg via ORAL
  Filled 2018-06-02 (×2): qty 1

## 2018-06-02 MED ORDER — METOCLOPRAMIDE HCL 5 MG/ML IJ SOLN: 5.0000 mg | Freq: Three times a day (TID) | INTRAMUSCULAR | Status: DC | PRN

## 2018-06-02 MED ORDER — METOCLOPRAMIDE HCL 5 MG PO TABS: 5.0000 mg | ORAL_TABLET | Freq: Three times a day (TID) | ORAL | Status: DC | PRN

## 2018-06-02 MED ORDER — GABAPENTIN 300 MG PO CAPS
300.0000 mg | ORAL_CAPSULE | Freq: Three times a day (TID) | ORAL | Status: DC
  Administered 2018-06-02 – 2018-06-04 (×6): 300 mg via ORAL
  Filled 2018-06-02 (×6): qty 1

## 2018-06-02 MED ORDER — ASPIRIN 81 MG PO CHEW
81.0000 mg | CHEWABLE_TABLET | Freq: Every day | ORAL | Status: DC
  Administered 2018-06-03 – 2018-06-04 (×2): 81 mg via ORAL
  Filled 2018-06-02 (×2): qty 1

## 2018-06-02 MED ORDER — POLYETHYLENE GLYCOL 3350 17 G PO PACK
17.0000 g | PACK | Freq: Every day | ORAL | Status: DC | PRN
Start: 2018-06-02 — End: 2018-06-04

## 2018-06-02 MED ORDER — DEXAMETHASONE SODIUM PHOSPHATE 10 MG/ML IJ SOLN
INTRAMUSCULAR | Status: AC
  Filled 2018-06-02: qty 1

## 2018-06-02 MED ORDER — HYDROMORPHONE HCL 1 MG/ML IJ SOLN: 0.5000 mg | INTRAMUSCULAR | Status: DC | PRN

## 2018-06-02 MED ORDER — CLINDAMYCIN PHOSPHATE 900 MG/50ML IV SOLN
900.0000 mg | INTRAVENOUS | Status: AC
  Administered 2018-06-02: 900 mg via INTRAVENOUS

## 2018-06-02 MED ORDER — LACTATED RINGERS IV SOLN
INTRAVENOUS | Status: DC | PRN
  Administered 2018-06-02: 07:00:00 via INTRAVENOUS

## 2018-06-02 MED ORDER — ROCURONIUM BROMIDE 100 MG/10ML IV SOLN
INTRAVENOUS | Status: DC | PRN
  Administered 2018-06-02: 40 mg via INTRAVENOUS

## 2018-06-02 MED ORDER — DOCUSATE SODIUM 100 MG PO CAPS
100.0000 mg | ORAL_CAPSULE | Freq: Two times a day (BID) | ORAL | Status: DC
  Administered 2018-06-02 – 2018-06-04 (×4): 100 mg via ORAL
  Filled 2018-06-02 (×4): qty 1

## 2018-06-02 MED ORDER — OXYCODONE HCL 5 MG PO TABS: 5.0000 mg | ORAL_TABLET | ORAL | 0 refills | Status: DC | PRN

## 2018-06-02 MED ORDER — DULOXETINE HCL 30 MG PO CPEP
30.0000 mg | ORAL_CAPSULE | Freq: Two times a day (BID) | ORAL | Status: DC
  Administered 2018-06-02 – 2018-06-04 (×4): 30 mg via ORAL
  Filled 2018-06-02 (×4): qty 1

## 2018-06-02 MED ORDER — PANTOPRAZOLE SODIUM 40 MG PO TBEC
40.0000 mg | DELAYED_RELEASE_TABLET | Freq: Every day | ORAL | Status: DC
  Administered 2018-06-03 – 2018-06-04 (×2): 40 mg via ORAL
  Filled 2018-06-02 (×2): qty 1

## 2018-06-02 MED ORDER — FOLIC ACID 1 MG PO TABS
1.0000 mg | ORAL_TABLET | Freq: Every day | ORAL | Status: DC
  Administered 2018-06-03 – 2018-06-04 (×2): 1 mg via ORAL
  Filled 2018-06-02 (×2): qty 1

## 2018-06-02 MED ORDER — ONDANSETRON HCL 4 MG/2ML IJ SOLN
4.0000 mg | Freq: Four times a day (QID) | INTRAMUSCULAR | Status: DC | PRN
  Administered 2018-06-03: 4 mg via INTRAVENOUS
  Filled 2018-06-02: qty 2

## 2018-06-02 MED ORDER — PHENOL 1.4 % MT LIQD: 1.0000 | OROMUCOSAL | Status: DC | PRN

## 2018-06-02 MED ORDER — SUGAMMADEX SODIUM 200 MG/2ML IV SOLN
INTRAVENOUS | Status: AC
  Filled 2018-06-02: qty 2

## 2018-06-02 MED ORDER — HYDROMORPHONE HCL 1 MG/ML IJ SOLN: 0.2500 mg | INTRAMUSCULAR | Status: DC | PRN

## 2018-06-02 MED ORDER — PROPOFOL 10 MG/ML IV BOLUS
INTRAVENOUS | Status: DC | PRN
  Administered 2018-06-02: 120 mg via INTRAVENOUS

## 2018-06-02 MED ORDER — SODIUM CHLORIDE 0.9 % IV SOLN
INTRAVENOUS | Status: DC
  Administered 2018-06-02: 14:00:00 via INTRAVENOUS

## 2018-06-02 MED ORDER — BUPIVACAINE-EPINEPHRINE 0.25% -1:200000 IJ SOLN
INTRAMUSCULAR | Status: DC | PRN
  Administered 2018-06-02: 10 mL

## 2018-06-02 MED ORDER — MENTHOL 3 MG MT LOZG: 1.0000 | LOZENGE | OROMUCOSAL | Status: DC | PRN

## 2018-06-02 MED ORDER — PROPOFOL 10 MG/ML IV BOLUS
INTRAVENOUS | Status: AC
  Filled 2018-06-02: qty 20

## 2018-06-02 MED ORDER — 0.9 % SODIUM CHLORIDE (POUR BTL) OPTIME
TOPICAL | Status: DC | PRN
  Administered 2018-06-02: 1000 mL

## 2018-06-02 MED ORDER — DEXTROSE 5 % IV SOLN
500.0000 mg | Freq: Four times a day (QID) | INTRAVENOUS | Status: DC | PRN
  Filled 2018-06-02: qty 5

## 2018-06-02 MED ORDER — METFORMIN HCL ER 500 MG PO TB24
1000.0000 mg | ORAL_TABLET | Freq: Every day | ORAL | Status: DC
  Administered 2018-06-02 – 2018-06-03 (×2): 1000 mg via ORAL
  Filled 2018-06-02 (×2): qty 2

## 2018-06-02 MED ORDER — ONDANSETRON HCL 4 MG PO TABS: 4.0000 mg | ORAL_TABLET | Freq: Four times a day (QID) | ORAL | Status: DC | PRN

## 2018-06-02 MED ORDER — CLINDAMYCIN PHOSPHATE 600 MG/50ML IV SOLN
600.0000 mg | Freq: Four times a day (QID) | INTRAVENOUS | Status: AC
  Administered 2018-06-02 – 2018-06-03 (×2): 600 mg via INTRAVENOUS
  Filled 2018-06-02 (×3): qty 50

## 2018-06-02 SURGICAL SUPPLY — 81 items
AID PSTN UNV HD RSTRNT DISP (MISCELLANEOUS)
BIT DRILL 170X2.5X (BIT) IMPLANT
BIT DRILL 5/64X5 DISP (BIT) ×3 IMPLANT
BIT DRL 170X2.5X (BIT) ×1
BLADE SAG 18X100X1.27 (BLADE) ×3 IMPLANT
CLOSURE STERI-STRIP 1/2X4 (GAUZE/BANDAGES/DRESSINGS) ×1
CLOSURE WOUND 1/2 X4 (GAUZE/BANDAGES/DRESSINGS) ×1
CLSR STERI-STRIP ANTIMIC 1/2X4 (GAUZE/BANDAGES/DRESSINGS) ×1 IMPLANT
COVER SURGICAL LIGHT HANDLE (MISCELLANEOUS) ×3 IMPLANT
DRAPE INCISE IOBAN 66X45 STRL (DRAPES) ×3 IMPLANT
DRAPE ORTHO SPLIT 77X108 STRL (DRAPES) ×6
DRAPE SURG ORHT 6 SPLT 77X108 (DRAPES) ×2 IMPLANT
DRAPE U-SHAPE 47X51 STRL (DRAPES) ×3 IMPLANT
DRILL 2.5 (BIT) ×3
DRSG ADAPTIC 3X8 NADH LF (GAUZE/BANDAGES/DRESSINGS) ×3 IMPLANT
DRSG PAD ABDOMINAL 8X10 ST (GAUZE/BANDAGES/DRESSINGS) ×3 IMPLANT
DURAPREP 26ML APPLICATOR (WOUND CARE) ×3 IMPLANT
ECCENTRIC EPIPHYSI MODULAR SZ1 (Trauma) IMPLANT
ELECT BLADE 4.0 EZ CLEAN MEGAD (MISCELLANEOUS) ×3
ELECT NDL TIP 2.8 STRL (NEEDLE) ×1 IMPLANT
ELECT NEEDLE TIP 2.8 STRL (NEEDLE) ×3 IMPLANT
ELECT REM PT RETURN 9FT ADLT (ELECTROSURGICAL) ×3
ELECTRODE BLDE 4.0 EZ CLN MEGD (MISCELLANEOUS) ×1 IMPLANT
ELECTRODE REM PT RTRN 9FT ADLT (ELECTROSURGICAL) ×1 IMPLANT
GAUZE SPONGE 4X4 12PLY STRL (GAUZE/BANDAGES/DRESSINGS) ×3 IMPLANT
GAUZE SPONGE 4X4 12PLY STRL LF (GAUZE/BANDAGES/DRESSINGS) ×2 IMPLANT
GLENOSPHERE DELTA XTEND LAT (Shoulder) ×2 IMPLANT
GLOVE BIOGEL PI ORTHO PRO 7.5 (GLOVE) ×2
GLOVE BIOGEL PI ORTHO PRO SZ8 (GLOVE) ×2
GLOVE ORTHO TXT STRL SZ7.5 (GLOVE) ×3 IMPLANT
GLOVE PI ORTHO PRO STRL 7.5 (GLOVE) ×1 IMPLANT
GLOVE PI ORTHO PRO STRL SZ8 (GLOVE) ×1 IMPLANT
GLOVE SURG ORTHO 8.5 STRL (GLOVE) ×3 IMPLANT
GOWN STRL REUS W/ TWL LRG LVL3 (GOWN DISPOSABLE) ×1 IMPLANT
GOWN STRL REUS W/ TWL XL LVL3 (GOWN DISPOSABLE) ×2 IMPLANT
GOWN STRL REUS W/TWL LRG LVL3 (GOWN DISPOSABLE) ×3
GOWN STRL REUS W/TWL XL LVL3 (GOWN DISPOSABLE) ×6
KIT BASIN OR (CUSTOM PROCEDURE TRAY) ×3 IMPLANT
KIT TURNOVER KIT B (KITS) ×3 IMPLANT
MANIFOLD NEPTUNE II (INSTRUMENTS) ×3 IMPLANT
METAGLENE DELTA EXTEND (Trauma) IMPLANT
METAGLENE DXTEND (Trauma) ×3 IMPLANT
MODULAR ECCENTRIC EPIPHYSI SZ1 (Trauma) ×3 IMPLANT
NDL 1/2 CIR MAYO (NEEDLE) ×1 IMPLANT
NDL HYPO 25GX1X1/2 BEV (NEEDLE) ×1 IMPLANT
NEEDLE 1/2 CIR MAYO (NEEDLE) ×3 IMPLANT
NEEDLE HYPO 25GX1X1/2 BEV (NEEDLE) ×3 IMPLANT
NS IRRIG 1000ML POUR BTL (IV SOLUTION) ×3 IMPLANT
PACK SHOULDER (CUSTOM PROCEDURE TRAY) ×3 IMPLANT
PAD ABD 8X10 STRL (GAUZE/BANDAGES/DRESSINGS) ×2 IMPLANT
PAD ARMBOARD 7.5X6 YLW CONV (MISCELLANEOUS) ×6 IMPLANT
PENCIL BUTTON HOLSTER BLD 10FT (ELECTRODE) IMPLANT
PIN GUIDE 1.2 (PIN) ×2 IMPLANT
PIN GUIDE GLENOPHERE 1.5MX300M (PIN) ×2 IMPLANT
PIN METAGLENE 2.5 (PIN) ×2 IMPLANT
RESTRAINT HEAD UNIVERSAL NS (MISCELLANEOUS) ×1 IMPLANT
SCREW 4.5X18MM (Screw) ×3 IMPLANT
SCREW 48L (Screw) ×2 IMPLANT
SCREW BN 18X4.5XSTRL SHLDR (Screw) IMPLANT
SCREW LOCK DELTA XTEND 4.5X30 (Screw) ×2 IMPLANT
SLING ARM XL FOAM STRAP (SOFTGOODS) ×2 IMPLANT
SPACER 38 PLUS 3 (Spacer) ×2 IMPLANT
SPONGE LAP 18X18 X RAY DECT (DISPOSABLE) IMPLANT
SPONGE LAP 4X18 RFD (DISPOSABLE) ×3 IMPLANT
STEM DELTA DIA 10 HA (Stem) ×2 IMPLANT
STRIP CLOSURE SKIN 1/2X4 (GAUZE/BANDAGES/DRESSINGS) ×2 IMPLANT
SUCTION FRAZIER HANDLE 10FR (MISCELLANEOUS) ×2
SUCTION TUBE FRAZIER 10FR DISP (MISCELLANEOUS) ×1 IMPLANT
SUT FIBERWIRE #2 38 T-5 BLUE (SUTURE)
SUT MNCRL AB 4-0 PS2 18 (SUTURE) ×3 IMPLANT
SUT VIC AB 0 CT2 27 (SUTURE) ×3 IMPLANT
SUT VIC AB 2-0 CT1 27 (SUTURE) ×3
SUT VIC AB 2-0 CT1 TAPERPNT 27 (SUTURE) ×1 IMPLANT
SUT VICRYL 0 CT 1 36IN (SUTURE) ×3 IMPLANT
SUTURE FIBERWR #2 38 T-5 BLUE (SUTURE) IMPLANT
SYR CONTROL 10ML LL (SYRINGE) ×3 IMPLANT
TAPE CLOTH SURG 4X10 WHT LF (GAUZE/BANDAGES/DRESSINGS) ×2 IMPLANT
TOWEL OR 17X24 6PK STRL BLUE (TOWEL DISPOSABLE) ×3 IMPLANT
TOWEL OR 17X26 10 PK STRL BLUE (TOWEL DISPOSABLE) ×3 IMPLANT
TOWER CARTRIDGE SMART MIX (DISPOSABLE) IMPLANT
YANKAUER SUCT BULB TIP NO VENT (SUCTIONS) ×3 IMPLANT

## 2018-06-02 NOTE — Anesthesia Postprocedure Evaluation (Signed)
Anesthesia Post Note  Patient: Diane [name]  Procedure(s) Performed: REVERSE SHOULDER ARTHROPLASTY (Right )     Patient location during evaluation: PACU Anesthesia Type: Regional and General Level of consciousness: awake and alert Pain management: pain level controlled Vital Signs Assessment: post-procedure vital signs reviewed and stable Respiratory status: spontaneous breathing, nonlabored ventilation, respiratory function stable and patient connected to nasal cannula oxygen Cardiovascular status: blood pressure returned to baseline and stable Postop Assessment: no apparent nausea or vomiting Anesthetic complications: no    Last Vitals:  Vitals:   06/02/18 1215 06/02/18 1228  BP:    Pulse: 89   Resp: 13   Temp:  36.9 C  SpO2: 96%     Last Pain:  Vitals:   06/02/18 1130  TempSrc:   PainSc: 10-Worst pain ever                 Derrick Orris,W. EDMOND

## 2018-06-02 NOTE — Anesthesia Procedure Notes (Signed)
Procedure Name: Intubation Date/Time: 06/02/2018 7:40 AM Performed by: Kyung Rudd, CRNA Pre-anesthesia Checklist: Patient identified, Emergency Drugs available, Suction available, Timeout performed and Patient being monitored Patient Re-evaluated:Patient Re-evaluated prior to induction Oxygen Delivery Method: Circle system utilized Preoxygenation: Pre-oxygenation with 100% oxygen Induction Type: IV induction Ventilation: Mask ventilation without difficulty Laryngoscope Size: Mac and 4 Grade View: Grade I Tube type: Oral Tube size: 7.0 mm Number of attempts: 1 Airway Equipment and Method: Stylet Placement Confirmation: ETT inserted through vocal cords under direct vision,  positive ETCO2 and breath sounds checked- equal and bilateral Secured at: 20 cm Tube secured with: Tape Dental Injury: Teeth and Oropharynx as per pre-operative assessment

## 2018-06-02 NOTE — Op Note (Signed)
NAME: Diane [name], Diane [name] MEDICAL RECORD HQ:[id number] ACCOUNT [id number] DATE OF BIRTH:1946-08-31 FACILITY: [facility] LOCATION: [facility] PHYSICIAN:STEVEN Orlena Sheldon, MD  OPERATIVE REPORT  DATE OF PROCEDURE:  06/02/2018  PREOPERATIVE DIAGNOSIS:  Right shoulder rotator cuff tear arthropathy.  POSTOPERATIVE DIAGNOSIS:  Right shoulder rotator cuff tear arthropathy.  PROCEDURE PERFORMED:  Right reverse total shoulder arthroplasty using DePuy Delta Xtend prosthesis with subscapularis repair.  ATTENDING SURGEON:  Esmond Plants, MD  ASSISTANT:  Darol Destine, [place], who was scrubbed during the entire procedure and necessary for satisfactory completion of surgery.  ANESTHESIA:  General anesthesia was used plus interscalene block.  ESTIMATED BLOOD LOSS:  150 mL.  FLUID REPLACEMENT:  1500 mL crystalloid.  INSTRUMENT COUNTS:  Correct.  COMPLICATIONS:  None.  ANTIBIOTICS:  Perioperative antibiotics were given.  INDICATIONS:  The patient is a 72 year old female with worsening right shoulder pain and dysfunction secondary to rotator cuff tear arthropathy.  The patient had prior rotator cuff repair and developed advanced atrophy of the supraspinatus and  infraspinatus muscles creating rotator cuff insufficiency.  The patient has had progressive pain and declining function and desires operative treatment with reverse shoulder replacement to restore fixed focal mechanics to the shoulder and restore  functional and eliminate pain.  Informed consent obtained.  DESCRIPTION OF PROCEDURE:  After an adequate level of anesthesia was achieved, the patient was positioned in modified beach chair position.  Right shoulder correctly identified and sterilely prepped and draped in the usual manner.  Timeout called.  We  entered the patient's shoulder using standard deltopectoral incision starting at the coracoid process extending down to the anterior humerus.  Dissection down through subcutaneous tissues using  needle tip Bovie.  Cephalic vein taken laterally and the  deltoid pectoralis taken medially.  Conjoined tendon identified and retracted medially.  Scar tissue was released from the subdeltoid space.  We then released the subscapularis subperiosteally off the lesser tuberosity and tagged with #2 FiberWire suture  in a modified Mason-Allen suture technique.  We released the inferior capsule progressively externally rotating the humerus.  We then extended the shoulder, releasing the scarred remnant of the rotator cuff.  The tendon was completely intact.  All  suture lines intact; however, the muscle was extremely noncompliant and seemed to be replaced with scar tissue and fat.  We released and removed and the supraspinatus and infraspinatus rotating the teres minor.  Next, we extended the shoulder, entering  the proximal humerus with a 6 mm reamer and reaming up to a size 10.  We then went ahead and cut the head using a 10 intramedullary resection guide and we cut that head 10 degrees of retroversion using an oscillating saw.  Rongeur used to remove excess  osteophytes.  We next subluxed the humerus posteriorly and did a 360 degree capsular labral excision.  We got back to the native glenoid.  We were able to identify the 6 o'clock position in the scapular neck.  We then placed our central guide pin and  then reamed for the baseplate with the mini reamer.  We then did a peripheral hand reaming to make sure we had clearance for glenosphere and then drilled the central peg hole.  We then impacted the metaglene baseplate into position and then placed a 48  locked screw inferiorly, a 30 locked screw at the base of the coracoid and an 18 nonlocked screw posteriorly.  We had good baseplate fixation and screw fixation.  We then trialled with a 38+2 eccentric glenosphere trial.  We  directed our attention back  towards the humeral side.  We completed our humeral preparation with the EPI 1 right modular reamer.  We  reamed for the metaphyseal portion of the component on the humeral side.  We then assembled the 10 trial stem and the size 1 right metaphysis placed  on the 0 setting and impacted that in 10 degrees of retroversion.  We then trialed with a 38+3 trial.  We had excellent stability and very good range of motion with no impingement and no limitations to motion.  We removed the trial implants.  We selected  the 38+2 eccentric glenosphere and placed that in the appropriate position and impacted that and screwed that home.  That was secured to the finger sweep to make sure it was fully seated.  There was no soft tissue and the nerve was free and clear.  We  then completed our humeral preparation.  We drilled holes and placed sutures for subscap repair using impaction grafting technique with available bone graft and impacted the press-fit HA coated stem size 10 with a size 1 right metaphysis set on the 0  setting and placed in 10 degrees of retroversion.  We then selected a 38+3 poly and impacted that into position, reduced the shoulder, had nice little pop going in.  The axillary nerve was under some tension, but not too tight and shoulder was quite  stable in all ranges of motion.  We irrigated thoroughly, repaired the subscapularis anatomically back to the lesser tuberosity and then deltopectoral repair with 0 Vicryl suture followed by 2-0 Vicryl for subcutaneous closure and 4-0 Monocryl for skin.   The patient was placed in a shoulder sling after sterile dressing was applied and transported to recovery room in stable condition.  TN/NUANCE  D:06/02/2018 T:06/02/2018 JOB:[id number]

## 2018-06-02 NOTE — Brief Op Note (Signed)
06/02/2018  9:56 AM  PATIENT:  Diane [name]  72 y.o. female  PRE-OPERATIVE DIAGNOSIS:  Right shoulder rotator cuff arthropathy  POST-OPERATIVE DIAGNOSIS:  Right shoulder rotator cuff arthropathy  PROCEDURE:  Procedure(s): REVERSE SHOULDER ARTHROPLASTY (Right) DePuy Delta Xtend with subscap repair  SURGEON:  Surgeon(s) and Role:    Netta Cedars, MD - Primary  PHYSICIAN ASSISTANT:   ASSISTANTS: [place] Bruns, PA-C   ANESTHESIA:   regional and general  EBL:  150 mL   BLOOD ADMINISTERED:none  DRAINS: none   LOCAL MEDICATIONS USED:  MARCAINE     SPECIMEN:  No Specimen  DISPOSITION OF SPECIMEN:  N/A  COUNTS:  YES  TOURNIQUET:  * No tourniquets in log *  DICTATION: .[id number]  PLAN OF CARE: Admit to inpatient   PATIENT DISPOSITION:  PACU - hemodynamically stable.   Delay start of Pharmacological VTE agent (>24hrs) due to surgical blood loss or risk of bleeding: not applicable

## 2018-06-02 NOTE — Interval H&P Note (Signed)
History and Physical Interval Note:  06/02/2018 7:21 AM  Diane [name]  has presented today for surgery, with the diagnosis of Right shoulder rotator cuff arthropathy  The various methods of treatment have been discussed with the patient and family. After consideration of risks, benefits and other options for treatment, the patient has consented to  Procedure(s): REVERSE SHOULDER ARTHROPLASTY (Right) as a surgical intervention .  The patient's history has been reviewed, patient examined, no change in status, stable for surgery.  I have reviewed the patient's chart and labs.  Questions were answered to the patient's satisfaction.     Tymeir Weathington,STEVEN R

## 2018-06-02 NOTE — Plan of Care (Signed)
  Problem: Education: Goal: Knowledge of General Education information will improve Description Including pain rating scale, medication(s)/side effects and non-pharmacologic comfort measures Outcome: Progressing   

## 2018-06-02 NOTE — Anesthesia Preprocedure Evaluation (Addendum)
Anesthesia Evaluation  Patient identified by MRN, date of birth, ID band Patient awake    Reviewed: Allergy & Precautions, H&P , NPO status , Patient's Chart, lab work & pertinent test results  Airway Mallampati: II  TM Distance: >3 FB Neck ROM: Full    Dental no notable dental hx. (+) Partial Lower, Partial Upper, Dental Advisory Given   Pulmonary neg pulmonary ROS,    Pulmonary exam normal breath sounds clear to auscultation       Cardiovascular hypertension, Pt. on medications  Rhythm:Regular Rate:Normal     Neuro/Psych negative neurological ROS  negative psych ROS   GI/Hepatic Neg liver ROS, hiatal hernia, GERD  Medicated and Controlled,  Endo/Other  diabetes, Type 2, Oral Hypoglycemic AgentsMorbid obesity  Renal/GU negative Renal ROS  negative genitourinary   Musculoskeletal  (+) Arthritis , Osteoarthritis,    Abdominal   Peds  Hematology negative hematology ROS (+)   Anesthesia Other Findings   Reproductive/Obstetrics negative OB ROS                            Anesthesia Physical Anesthesia Plan  ASA: III  Anesthesia Plan: General   Post-op Pain Management:  Regional for Post-op pain   Induction: Intravenous  PONV Risk Score and Plan: 4 or greater and Ondansetron, Dexamethasone and Midazolam  Airway Management Planned: Oral ETT  Additional Equipment:   Intra-op Plan:   Post-operative Plan: Extubation in OR  Informed Consent: I have reviewed the patients History and Physical, chart, labs and discussed the procedure including the risks, benefits and alternatives for the proposed anesthesia with the patient or authorized representative who has indicated his/her understanding and acceptance.   Dental advisory given  Plan Discussed with: CRNA  Anesthesia Plan Comments:        Anesthesia Quick Evaluation

## 2018-06-02 NOTE — Anesthesia Procedure Notes (Signed)
Anesthesia Regional Block: Interscalene brachial plexus block   Pre-Anesthetic Checklist: ,, timeout performed, Correct Patient, Correct Site, Correct Laterality, Correct Procedure, Correct Position, site marked, Risks and benefits discussed, pre-op evaluation,  At surgeon's request and post-op pain management  Laterality: Right  Prep: Maximum Sterile Barrier Precautions used, chloraprep       Needles:  Injection technique: Single-shot  Needle Type: Echogenic Stimulator Needle     Needle Length: 5cm  Needle Gauge: 22     Additional Needles:   Procedures:, nerve stimulator,,, ultrasound used (permanent image in chart),,,,   Nerve Stimulator or Paresthesia:  Response: Biceps response,   Additional Responses:   Narrative:  Start time: 06/02/2018 6:51 AM End time: 06/02/2018 7:01 AM Injection made incrementally with aspirations every 5 mL. Anesthesiologist: Roderic Palau, MD  Additional Notes: 2% Lidocaine skin wheel.

## 2018-06-02 NOTE — Discharge Instructions (Signed)
Keep the incision clean and dry and intact, ok to shower and get the incision wet after one week.  Ok to remove sling as you feel you can.  Do not push up using your right arm out of a chair.Do not reach behind your back with the right arm.  Ok to use the right arm for light daily activity in front of you.  Follow up with Dr Veverly Fells in two weeks in the office, call [phone]

## 2018-06-02 NOTE — Transfer of Care (Signed)
Immediate Anesthesia Transfer of Care Note  Patient: Diane [name]  Procedure(s) Performed: REVERSE SHOULDER ARTHROPLASTY (Right )  Patient Location: PACU  Anesthesia Type:GA combined with regional for post-op pain  Level of Consciousness: awake, alert  and oriented  Airway & Oxygen Therapy: Patient Spontanous Breathing and Patient connected to nasal cannula oxygen  Post-op Assessment: Report given to RN and Post -op Vital signs reviewed and stable  Post vital signs: Reviewed and stable  Last Vitals:  Vitals Value Taken Time  BP    Temp    Pulse 90 06/02/2018  9:59 AM  Resp 17 06/02/2018  9:59 AM  SpO2 94 % 06/02/2018  9:59 AM  Vitals shown include unvalidated device data.  Last Pain:  Vitals:   06/02/18 0614  TempSrc:   PainSc: 0-No pain      Patients Stated Pain Goal: 3 (41/28/20 8138)  Complications: No apparent anesthesia complications

## 2018-06-03 LAB — HEMOGLOBIN AND HEMATOCRIT, BLOOD
HCT: 32.7 % — ABNORMAL LOW (ref 36.0–46.0)
HEMOGLOBIN: 9.9 g/dL — AB (ref 12.0–15.0)

## 2018-06-03 LAB — BASIC METABOLIC PANEL
Anion gap: 9 (ref 5–15)
BUN: 10 mg/dL (ref 8–23)
CALCIUM: 8.4 mg/dL — AB (ref 8.9–10.3)
CO2: 25 mmol/L (ref 22–32)
CREATININE: 0.69 mg/dL (ref 0.44–1.00)
Chloride: 106 mmol/L (ref 98–111)
GFR calc non Af Amer: >60 mL/min (ref >60)
GLUCOSE: 161 mg/dL — AB (ref 70–99)
Potassium: 3.8 mmol/L (ref 3.5–5.1)
Sodium: 140 mmol/L (ref 135–145)

## 2018-06-03 NOTE — Evaluation (Signed)
Occupational Therapy Evaluation Patient Details Name: Diane [name] MRN: [id number] DOB: 1946-10-15 Today's Date: 06/03/2018    History of Present Illness RIGHT REVERSE SHOULDER ARTHROPLASTY   Clinical Impression   Pt with decline in function and safety with ADLs with decreased strength, endurance, and function/ROM of R UE. Per MD, d/c for today will hold until tomorrow. Pt educated on ADL techniques, AROM allowed by MD per orders with handouts provided (lapslides,pendulums, hand/wrist/elbow), sling wear and positioning for sleeping. Pt reports significant pain since nerve block wearing off and given oral pain meds PTA with some lethargy present. PTA pt lived at home with her daughter and was independent with ADLs, IADLs, home mgt, no AD for mobility and was driving. Pt's daughter will ne bale to assist her at home; daughter not present this session. Pt would benefit from acute OT services to address impairments to maximize level of function and safety    Follow Up Recommendations  Follow surgeon's recommendation for DC plan and follow-up therapies;Supervision - Intermittent    Equipment Recommendations  None recommended by OT    Recommendations for Other Services       Precautions / Restrictions Precautions Precautions: Shoulder Shoulder Interventions: Shoulder sling/immobilizer;At all times;Off for dressing/bathing/exercises Precaution Booklet Issued: Yes (comment) Restrictions Weight Bearing Restrictions: Yes RUE Weight Bearing: Non weight bearing Other Position/Activity Restrictions: NO pushing pt load bearing, may perform gentle ADLs with elbow close per MD instructions      Mobility Bed Mobility Overal bed mobility: Needs Assistance Bed Mobility: Supine to Sit           General bed mobility comments: min A to elevate trunk  Transfers Overall transfer level: Needs assistance Equipment used: 1 person hand held assist Transfers: Sit to/from Stand Sit to Stand: Min  guard;Supervision         General transfer comment: pt initially unsteady    Balance Overall balance assessment: No apparent balance deficits (not formally assessed)                                         ADL either performed or assessed with clinical judgement   ADL Overall ADL's : Needs assistance/impaired Eating/Feeding: Set up;Sitting   Grooming: Wash/dry hands;Wash/dry face;Set up;Supervision/safety;Standing   Upper Body Bathing: Moderate assistance   Lower Body Bathing: Moderate assistance   Upper Body Dressing : Moderate assistance   Lower Body Dressing: Moderate assistance   Toilet Transfer: Copy Details (indicate cue type and reason): slow, guarded pace of moevment (suspect pain meds as cause) Toileting- Clothing Manipulation and Hygiene: Moderate assistance   Tub/ Shower Transfer: Supervision/safety   Functional mobility during ADLs: Supervision/safety General ADL Comments: pt educated on ADL techniques, no family present to review with also     Vision Baseline Vision/History: Wears glasses Wears Glasses: Reading only Patient Visual Report: No change from baseline       Perception     Praxis      Pertinent Vitals/Pain Pain Assessment: 0-10 Pain Score: 9  Pain Location: R shoulder, block worn off Pain Descriptors / Indicators: Aching Pain Intervention(s): Limited activity within patient's tolerance;Monitored during session;Premedicated before session;Repositioned     Hand Dominance Right   Extremity/Trunk Assessment Upper Extremity Assessment Upper Extremity Assessment: Overall WFL for tasks assessed       Cervical / Trunk Assessment Cervical / Trunk Assessment: Normal   Communication Communication Communication: No difficulties  Cognition Arousal/Alertness: Lethargic;Suspect due to medications Behavior During Therapy: [facility] for tasks assessed/performed Overall Cognitive Status: Within  Functional Limits for tasks assessed                                 General Comments: pt lethargic from oral pain meds and required cues to attend   General Comments       Exercises Shoulder Exercises Pendulum Exercise: Right;Seated;Standing;5 reps Elbow Flexion: AROM;5 reps;Seated Elbow Extension: AROM;5 reps;Seated Wrist Flexion: AROM;5 reps;Seated Wrist Extension: AROM;Seated;5 reps Digit Composite Flexion: AROM;5 reps;Seated Composite Extension: AROM;5 reps;Seated   Shoulder Instructions Shoulder Instructions Donning/doffing shirt without moving shoulder: Moderate assistance Method for sponge bathing under operated UE: Moderate assistance Donning/doffing sling/immobilizer: Moderate assistance Correct positioning of sling/immobilizer: Supervision/safety Pendulum exercises (written home exercise program): Minimal assistance ROM for elbow, wrist and digits of operated UE: Minimal assistance Sling wearing schedule (on at all times/off for ADL's): Supervision/safety Proper positioning of operated UE when showering: Supervision/safety Positioning of UE while sleeping: [place] expects to be discharged to:: Private residence Living Arrangements: Children Available Help at Discharge: Family Type of Home: House Home Access: Stairs to enter Technical brewer of Steps: 2   Home Layout: Two level;Full bath on main level;Bed/bath upstairs;1/2 bath on main level   Alternate Level Stairs-Rails: Right;Left Bathroom Shower/Tub: Teacher, early years/pre: Standard     Home Equipment: None          Prior Functioning/Environment Level of Independence: Independent                 OT Problem List: Decreased strength;Decreased activity tolerance;Impaired UE functional use;Decreased range of motion;Decreased coordination      OT Treatment/Interventions: Self-care/ADL training;Therapeutic  exercise;Patient/family education    OT Goals(Current goals can be found in the care plan section) Acute Rehab OT Goals Patient Stated Goal: get pain under control and go home OT Goal Formulation: With patient Time For Goal Achievement: 06/10/18 Potential to Achieve Goals: Good ADL Goals Pt Will Transfer to Toilet: with modified independence;ambulating Pt Will Perform Tub/Shower Transfer: with modified independence;ambulating Additional ADL Goal #1: Pt will be able to verbalize proper ADL techniques for UB ADLs for caregiver to assist Additional ADL Goal #2: Pt wil perform all ROM exercises of R UE as instructed by OT/allowed by MD per handouts provided  OT Frequency: Min 1X/week   Barriers to D/C:            Co-evaluation              AM-PAC PT "6 Clicks" Daily Activity     Outcome Measure Help from another person eating meals?: None Help from another person taking care of personal grooming?: A Little Help from another person toileting, which includes using toliet, bedpan, or urinal?: A Little Help from another person bathing (including washing, rinsing, drying)?: A Lot Help from another person to put on and taking off regular upper body clothing?: A Little Help from another person to put on and taking off regular lower body clothing?: A Lot 6 Click Score: 17   End of Session Equipment Utilized During Treatment: Other (comment)(R UE sling)  Activity Tolerance: Patient limited by fatigue;Patient limited by pain Patient left: in chair;with call bell/phone within reach  OT Visit Diagnosis: Muscle weakness (generalized) (M62.81);Pain Pain - Right/Left: Right Pain - part of body: Shoulder  Time: 1975-8832 OT Time Calculation (min): 45 min Charges:  OT General Charges $OT Visit: 1 Visit OT Evaluation $OT Eval Low Complexity: 1 Low OT Treatments $Self Care/Home Management : 8-22 mins $Therapeutic Exercise: 8-22 mins    Britt Bottom 06/03/2018, 1:04 PM

## 2018-06-03 NOTE — Plan of Care (Signed)
  Problem: Education: Goal: Knowledge of General Education information will improve Description: Including pain rating scale, medication(s)/side effects and non-pharmacologic comfort measures Outcome: Progressing   Problem: Clinical Measurements: Goal: Ability to maintain clinical measurements within normal limits will improve Outcome: Progressing Goal: Will remain free from infection Outcome: Progressing Goal: Diagnostic test results will improve Outcome: Progressing Goal: Respiratory complications will improve Outcome: Progressing Goal: Cardiovascular complication will be avoided Outcome: Progressing   Problem: Activity: Goal: Risk for activity intolerance will decrease Outcome: Progressing   Problem: Pain Managment: Goal: General experience of comfort will improve Outcome: Progressing   Problem: Safety: Goal: Ability to remain free from injury will improve Outcome: Progressing   

## 2018-06-03 NOTE — Progress Notes (Signed)
Orthopedics Progress Note  Subjective: Patient having quite a bit of pain s/p block wearing off  Objective:  Vitals:   06/03/18 0507 06/03/18 0826  BP: (!) 147/74 (!) 153/79  Pulse: 81 87  Resp:  15  Temp: 98.5 F (36.9 C) 98.7 F (37.1 C)  SpO2:  92%    General: Awake and alert  Musculoskeletal: Right shoulder dressing CDI, minimal distal swelling Neurovascularly intact  Lab Results  Component Value Date   WBC 6.1 05/29/2018   HGB 9.9 (L) 06/03/2018   HCT 32.7 (L) 06/03/2018   MCV 88.1 05/29/2018   PLT 297 05/29/2018       Component Value Date/Time   NA 140 06/03/2018 0643   NA 141 05/16/2017 0943   K 3.8 06/03/2018 0643   CL 106 06/03/2018 0643   CO2 25 06/03/2018 0643   GLUCOSE 161 (H) 06/03/2018 0643   BUN 10 06/03/2018 0643   BUN 16 05/16/2017 0943   CREATININE 0.69 06/03/2018 0643   CALCIUM 8.4 (L) 06/03/2018 0643   GFRNONAA >60 06/03/2018 0643   GFRAA >60 06/03/2018 0643    Lab Results  Component Value Date   INR 1.13 04/20/2017    Assessment/Plan: POD #1 s/p Procedure(s): REVERSE SHOULDER ARTHROPLASTY Plan to hold patient until tomorrow for pain control and therapy  Remo Lipps R. Veverly Fells, MD 06/03/2018 10:05 AM

## 2018-06-04 NOTE — Progress Notes (Signed)
Occupational Therapy Treatment Patient Details Name: Diane [name] MRN: [id number] DOB: 1946/05/20 Today's Date: 06/04/2018    History of present illness 72 yo female sp Right shoulder arthroplasty. PMH including arthritis, cataracts, DM, GERD, HTN, and pulmonary edema.    OT comments  Pt progressing towards established OT goals. Pt performing RUE exercises with Min A for support at elbow and to facilitate safe movements of RUE. Pt's daughter present throughout session. Reviewed all shoulder precautions, sling wear/managment, and compensatory techniques for ADLs. Pt and daughter verbalized and demonstrated understanding. Pt requiring Mod A for UB ADLs and Max A for sling management with assistance from daughter. Answering all pt questions in preparation for dc later today. Continue to recommend post-acute rehab per MD and supervision at home.    Follow Up Recommendations  Follow surgeon's recommendation for DC plan and follow-up therapies;Supervision - Intermittent    Equipment Recommendations  None recommended by OT    Recommendations for Other Services      Precautions / Restrictions Precautions Precautions: Shoulder Type of Shoulder Precautions: Reviewed all shoulder precautions and compensatory techniques during ADLs with pt adn family.  Shoulder Interventions: Shoulder sling/immobilizer;At all times;Off for dressing/bathing/exercises Precaution Booklet Issued: Yes (comment) Precaution Comments: AROM of elbow, wrist, and hand. PROM/AROM of shoulder with gentle lap slides and pendulums as well as gentle ROM for ADLs. Watch subscap repair with no pushing or weight bearing.  Required Braces or Orthoses: Sling Restrictions Weight Bearing Restrictions: Yes RUE Weight Bearing: Non weight bearing Other Position/Activity Restrictions: NO pushing pt load bearing, may perform gentle ADLs with elbow close per MD instructions       Mobility Bed Mobility               General bed  mobility comments: In recliner upon arrival  Transfers                      Balance Overall balance assessment: No apparent balance deficits (not formally assessed)                                         ADL either performed or assessed with clinical judgement   ADL Overall ADL's : Needs assistance/impaired           Upper Body Bathing Details (indicate cue type and reason): Reviewed bathing techniques with pt and daughter. Bother vebralized understanding and pt demosntrating techniques     Upper Body Dressing : Moderate assistance;Maximal assistance;With caregiver independent assisting;Sitting Upper Body Dressing Details (indicate cue type and reason): Reviewing UB dressing tehcniques with pt and daughter. pt requiring Max A for donning/doffing sling with daughters assistance.                    General ADL Comments: Reveiwing all education on shoulder precautions, exercises, ROM, sling management, and UB ADLs. Pt and daughter verbalized and demonstrated understanding.      Vision       Perception     Praxis      Cognition Arousal/Alertness: Awake/alert Behavior During Therapy: WFL for tasks assessed/performed Overall Cognitive Status: Within Functional Limits for tasks assessed                                 General Comments: Pt requiring increased cues and moving slowly.  Exercises Exercises: Shoulder;Hand exercises Shoulder Exercises Pendulum Exercise: Right;Seated;5 reps Elbow Flexion: AROM;5 reps;Seated Elbow Extension: AROM;5 reps;Seated Wrist Flexion: AROM;5 reps;Seated Wrist Extension: AROM;Seated;5 reps Digit Composite Flexion: AROM;5 reps;Seated Composite Extension: AROM;5 reps;Seated Hand Exercises Forearm Supination: AROM;Right;10 reps;Seated Forearm Pronation: AROM;Right;10 reps;Seated   Shoulder Instructions Shoulder Instructions Donning/doffing shirt without moving shoulder: Moderate  assistance Method for sponge bathing under operated UE: Moderate assistance Donning/doffing sling/immobilizer: Maximal assistance;Caregiver independent with task Correct positioning of sling/immobilizer: Supervision/safety;Caregiver independent with task Pendulum exercises (written home exercise program): Minimal assistance ROM for elbow, wrist and digits of operated UE: Minimal assistance Sling wearing schedule (on at all times/off for ADL's): Supervision/safety Proper positioning of operated UE when showering: Supervision/safety Positioning of UE while sleeping: Supervision/safety     General Comments Daughter, [name], present throughout session    Pertinent Vitals/ Pain       Pain Assessment: Faces Faces Pain Scale: Hurts even more Pain Location: R shoulder Pain Descriptors / Indicators: Aching Pain Intervention(s): Monitored during session;Limited activity within patient's tolerance;Repositioned  Home Living                                          Prior Functioning/Environment              Frequency  Min 1X/week        Progress Toward Goals  OT Goals(current goals can now be found in the care plan section)  Progress towards OT goals: Progressing toward goals  Acute Rehab OT Goals Patient Stated Goal: get pain under control and go home OT Goal Formulation: With patient Time For Goal Achievement: 06/10/18 Potential to Achieve Goals: Good ADL Goals Pt Will Transfer to Toilet: with modified independence;ambulating Pt Will Perform Tub/Shower Transfer: with modified independence;ambulating Additional ADL Goal #1: Pt will be able to verbalize proper ADL techniques for UB ADLs for caregiver to assist Additional ADL Goal #2: Pt wil perform all ROM exercises of R UE as instructed by OT/allowed by MD per handouts provided  Plan Discharge plan remains appropriate    Co-evaluation                 AM-PAC PT "6 Clicks" Daily Activity     Outcome  Measure   Help from another person eating meals?: None Help from another person taking care of personal grooming?: A Little Help from another person toileting, which includes using toliet, bedpan, or urinal?: A Little Help from another person bathing (including washing, rinsing, drying)?: A Lot Help from another person to put on and taking off regular upper body clothing?: A Little Help from another person to put on and taking off regular lower body clothing?: A Lot 6 Click Score: 17    End of Session Equipment Utilized During Treatment: Other (comment)(R UE sling)  OT Visit Diagnosis: Muscle weakness (generalized) (M62.81);Pain Pain - Right/Left: Right Pain - part of body: Shoulder   Activity Tolerance Patient tolerated treatment well   Patient Left in chair;with call bell/phone within reach;with family/visitor present   Nurse Communication Mobility status;Precautions;Weight bearing status        Time: 9381-0175 OT Time Calculation (min): 18 min  Charges: OT General Charges $OT Visit: 1 Visit OT Treatments $Self Care/Home Management : 8-22 mins  [place], OTR/L Acute Rehab Pager: [phone] Office: [place] 06/04/2018, 10:36 AM

## 2018-06-04 NOTE — Progress Notes (Signed)
Orthopedics Progress Note  Subjective: Improved pain today.  Objective:  Vitals:   06/03/18 2104 06/04/18 0511  BP: (!) 161/83 113/62  Pulse: 90 82  Resp: 17 16  Temp: 98.5 F (36.9 C) 99 F (37.2 C)  SpO2: 94% 96%    General: Awake and alert  Musculoskeletal: right shoulder dressing changed, mod swelling NVI Neurovascularly intact  Lab Results  Component Value Date   WBC 6.1 05/29/2018   HGB 9.9 (L) 06/03/2018   HCT 32.7 (L) 06/03/2018   MCV 88.1 05/29/2018   PLT 297 05/29/2018       Component Value Date/Time   NA 140 06/03/2018 0643   NA 141 05/16/2017 0943   K 3.8 06/03/2018 0643   CL 106 06/03/2018 0643   CO2 25 06/03/2018 0643   GLUCOSE 161 (H) 06/03/2018 0643   BUN 10 06/03/2018 0643   BUN 16 05/16/2017 0943   CREATININE 0.69 06/03/2018 0643   CALCIUM 8.4 (L) 06/03/2018 0643   GFRNONAA >60 06/03/2018 0643   GFRAA >60 06/03/2018 0643    Lab Results  Component Value Date   INR 1.13 04/20/2017    Assessment/Plan: POD #2 s/p Procedure(s): REVERSE SHOULDER ARTHROPLASTY Discharge home today Follow up in two weeks  Doran Heater. Veverly Fells, MD 06/04/2018 8:34 AM

## 2018-06-04 NOTE — Discharge Summary (Signed)
Orthopedic Discharge Summary        Physician Discharge Summary  Patient ID: Diane [name] MRN: [id number] DOB/AGE: 72/04/1946 72 y.o.  Admit date: 06/02/2018 Discharge date: 06/04/2018   Procedures:  Procedure(s) (LRB): REVERSE SHOULDER ARTHROPLASTY (Right)  Attending Physician:  Dr. Esmond Plants  Admission Diagnoses:   Right shoulder end stage arthritis, rotator cuff insufficiency  Discharge Diagnoses:  Right shoulder end stage arthritis, rotator cuff insufficiency   Past Medical History:  Diagnosis Date  . Abnormal nuclear stress test 10/18/2013  . Arm pain 10/18/2013  . Arthritis   . Cataract    RIGHT EYE  . Chest pain 10/18/2013  . Diabetes mellitus    dx over 5 yrs ago  . GERD (gastroesophageal reflux disease)   . Heart murmur    "yrs ago in [place] -- been here since 1999, "  . History of hiatal hernia   . Hypertension   . Prolonged Q-T interval on ECG 04/20/2017  . Pulmonary edema 04/20/2017    PCP: [name], MD   Discharged Condition: good  Hospital Course:  Patient underwent the above stated procedure on 06/02/2018. Patient tolerated the procedure well and brought to the recovery room in good condition and subsequently to the floor. Patient had an uncomplicated hospital course and was stable for discharge.   Disposition: Discharge disposition: 01-Home or Self Care      with follow up in 2 weeks   Follow-up Information    Netta Cedars, MD. Call in 2 weeks.   Specialty:  Orthopedic Surgery Why:  [phone] Contact information: [address] [place] [address] [phone]           Discharge Instructions    Call MD / Call 911   Complete by:  As directed    If you experience chest pain or shortness of breath, CALL 911 and be transported to the hospital emergency room.  If you develope a fever above 101 F, pus (white drainage) or increased drainage or redness at the wound, or calf pain, call your surgeon's  office.   Constipation Prevention   Complete by:  As directed    Drink plenty of fluids.  Prune juice may be helpful.  You may use a stool softener, such as Colace (over the counter) 100 mg twice a day.  Use MiraLax (over the counter) for constipation as needed.   Diet - low sodium heart healthy   Complete by:  As directed    Increase activity slowly as tolerated   Complete by:  As directed       Allergies as of 06/04/2018      Reactions   Cephalexin Other (See Comments)   URINARY RETENTION = "blocked my urine"   Codeine Rash   Nucynta [tapentadol Hcl] Other (See Comments)   Altered mental status      Medication List    STOP taking these medications   hydrochlorothiazide 25 MG tablet Commonly known as:  HYDRODIURIL     TAKE these medications   aspirin 81 MG chewable tablet Chew 81 mg by mouth daily.   DULoxetine 30 MG capsule Commonly known as:  CYMBALTA Take 30 mg by mouth 2 (two) times daily.   esomeprazole 40 MG capsule Commonly known as:  NEXIUM Take 40 mg by mouth daily.   folic acid 1 MG tablet Commonly known as:  FOLVITE TAKE 1 TABLET (1 mg) BY MOUTH DAILY   gabapentin 300 MG capsule Commonly known as:  NEURONTIN  Take 300 mg by mouth 3 (three) times daily.   hydroxychloroquine 200 MG tablet Commonly known as:  PLAQUENIL Take 200 mg by mouth daily. WITH FOOD OR MILK   metFORMIN 500 MG 24 hr tablet Commonly known as:  GLUCOPHAGE-XR Take 1,000 mg by mouth daily with supper.   methocarbamol 500 MG tablet Commonly known as:  ROBAXIN Take 1 tablet (500 mg total) by mouth 3 (three) times daily as needed.   methotrexate 2.5 MG tablet Commonly known as:  RHEUMATREX TAKE 8 TABLETS (20mg ) BY MOUTH ONCE WEEKLY ON THURSDAY   ondansetron 4 MG tablet Commonly known as:  ZOFRAN Take 4 mg by mouth 2 (two) times daily.   oxyCODONE 5 MG immediate release tablet Commonly known as:  ROXICODONE Take 1-2 tablets (5-10 mg total) by mouth every 4 (four) hours as  needed for moderate pain or severe pain. What changed:    how much to take  when to take this  reasons to take this   telmisartan 80 MG tablet Commonly known as:  MICARDIS Take 80 mg by mouth 2 (two) times daily.         Signed: Augustin Schooling 06/04/2018, 8:37 AM  [facility] is now [employer] [address], [place], [place], [place] [address] Phone: [place]

## 2018-06-06 ENCOUNTER — Encounter (HOSPITAL_COMMUNITY): Payer: Self-pay | Admitting: Orthopedic Surgery

## 2018-06-11 ENCOUNTER — Inpatient Hospital Stay (HOSPITAL_COMMUNITY)
Admission: EM | Admit: 2018-06-11 | Discharge: 2018-06-14 | DRG: 195 | Disposition: A | Discharge status: Skilled Nursing Facility | Payer: Medicare Other | Attending: Family Medicine | Admitting: Family Medicine

## 2018-06-11 ENCOUNTER — Emergency Department (HOSPITAL_COMMUNITY): Payer: Medicare Other

## 2018-06-11 ENCOUNTER — Other Ambulatory Visit: Payer: Self-pay

## 2018-06-11 ENCOUNTER — Encounter (HOSPITAL_COMMUNITY): Payer: Self-pay | Admitting: Emergency Medicine

## 2018-06-11 DIAGNOSIS — D649 Anemia, unspecified: Secondary | ICD-10-CM | POA: Diagnosis present

## 2018-06-11 DIAGNOSIS — Z888 Allergy status to other drugs, medicaments and biological substances status: Secondary | ICD-10-CM

## 2018-06-11 DIAGNOSIS — Z743 Need for continuous supervision: Secondary | ICD-10-CM | POA: Diagnosis not present

## 2018-06-11 DIAGNOSIS — I10 Essential (primary) hypertension: Secondary | ICD-10-CM | POA: Diagnosis present

## 2018-06-11 DIAGNOSIS — E876 Hypokalemia: Secondary | ICD-10-CM | POA: Diagnosis present

## 2018-06-11 DIAGNOSIS — E669 Obesity, unspecified: Secondary | ICD-10-CM | POA: Diagnosis present

## 2018-06-11 DIAGNOSIS — E1142 Type 2 diabetes mellitus with diabetic polyneuropathy: Secondary | ICD-10-CM | POA: Diagnosis present

## 2018-06-11 DIAGNOSIS — J181 Lobar pneumonia, unspecified organism: Secondary | ICD-10-CM | POA: Diagnosis present

## 2018-06-11 DIAGNOSIS — R296 Repeated falls: Secondary | ICD-10-CM | POA: Diagnosis present

## 2018-06-11 DIAGNOSIS — Z7951 Long term (current) use of inhaled steroids: Secondary | ICD-10-CM

## 2018-06-11 DIAGNOSIS — R262 Difficulty in walking, not elsewhere classified: Secondary | ICD-10-CM | POA: Diagnosis not present

## 2018-06-11 DIAGNOSIS — K219 Gastro-esophageal reflux disease without esophagitis: Secondary | ICD-10-CM | POA: Diagnosis present

## 2018-06-11 DIAGNOSIS — Z9049 Acquired absence of other specified parts of digestive tract: Secondary | ICD-10-CM | POA: Diagnosis not present

## 2018-06-11 DIAGNOSIS — E119 Type 2 diabetes mellitus without complications: Secondary | ICD-10-CM | POA: Diagnosis not present

## 2018-06-11 DIAGNOSIS — Z6835 Body mass index (BMI) 35.0-35.9, adult: Secondary | ICD-10-CM

## 2018-06-11 DIAGNOSIS — R32 Unspecified urinary incontinence: Secondary | ICD-10-CM | POA: Diagnosis present

## 2018-06-11 DIAGNOSIS — J811 Chronic pulmonary edema: Secondary | ICD-10-CM | POA: Diagnosis not present

## 2018-06-11 DIAGNOSIS — Z7982 Long term (current) use of aspirin: Secondary | ICD-10-CM

## 2018-06-11 DIAGNOSIS — Z7984 Long term (current) use of oral hypoglycemic drugs: Secondary | ICD-10-CM

## 2018-06-11 DIAGNOSIS — R488 Other symbolic dysfunctions: Secondary | ICD-10-CM | POA: Diagnosis not present

## 2018-06-11 DIAGNOSIS — Z789 Other specified health status: Secondary | ICD-10-CM | POA: Diagnosis not present

## 2018-06-11 DIAGNOSIS — Y95 Nosocomial condition: Secondary | ICD-10-CM | POA: Diagnosis present

## 2018-06-11 DIAGNOSIS — I350 Nonrheumatic aortic (valve) stenosis: Secondary | ICD-10-CM | POA: Diagnosis present

## 2018-06-11 DIAGNOSIS — K449 Diaphragmatic hernia without obstruction or gangrene: Secondary | ICD-10-CM | POA: Diagnosis present

## 2018-06-11 DIAGNOSIS — Z881 Allergy status to other antibiotic agents status: Secondary | ICD-10-CM

## 2018-06-11 DIAGNOSIS — Z96611 Presence of right artificial shoulder joint: Secondary | ICD-10-CM | POA: Diagnosis present

## 2018-06-11 DIAGNOSIS — J159 Unspecified bacterial pneumonia: Secondary | ICD-10-CM | POA: Diagnosis not present

## 2018-06-11 DIAGNOSIS — Z79899 Other long term (current) drug therapy: Secondary | ICD-10-CM

## 2018-06-11 DIAGNOSIS — M6281 Muscle weakness (generalized): Secondary | ICD-10-CM | POA: Diagnosis not present

## 2018-06-11 DIAGNOSIS — R112 Nausea with vomiting, unspecified: Secondary | ICD-10-CM | POA: Diagnosis present

## 2018-06-11 DIAGNOSIS — J189 Pneumonia, unspecified organism: Secondary | ICD-10-CM | POA: Diagnosis not present

## 2018-06-11 DIAGNOSIS — R05 Cough: Secondary | ICD-10-CM | POA: Diagnosis not present

## 2018-06-11 DIAGNOSIS — R279 Unspecified lack of coordination: Secondary | ICD-10-CM | POA: Diagnosis not present

## 2018-06-11 DIAGNOSIS — Z90710 Acquired absence of both cervix and uterus: Secondary | ICD-10-CM | POA: Diagnosis not present

## 2018-06-11 DIAGNOSIS — R2681 Unsteadiness on feet: Secondary | ICD-10-CM | POA: Diagnosis not present

## 2018-06-11 DIAGNOSIS — R269 Unspecified abnormalities of gait and mobility: Secondary | ICD-10-CM

## 2018-06-11 DIAGNOSIS — Z885 Allergy status to narcotic agent status: Secondary | ICD-10-CM | POA: Diagnosis not present

## 2018-06-11 DIAGNOSIS — L80 Vitiligo: Secondary | ICD-10-CM | POA: Diagnosis not present

## 2018-06-11 DIAGNOSIS — R51 Headache: Secondary | ICD-10-CM | POA: Diagnosis not present

## 2018-06-11 LAB — COMPREHENSIVE METABOLIC PANEL
ALK PHOS: 58 U/L (ref 38–126)
ALT: 39 U/L (ref 0–44)
ANION GAP: 12 (ref 5–15)
AST: 47 U/L — AB (ref 15–41)
Albumin: 2.8 g/dL — ABNORMAL LOW (ref 3.5–5.0)
BILIRUBIN TOTAL: 0.5 mg/dL (ref 0.3–1.2)
BUN: 9 mg/dL (ref 8–23)
CALCIUM: 8.4 mg/dL — AB (ref 8.9–10.3)
CO2: 25 mmol/L (ref 22–32)
CREATININE: 0.79 mg/dL (ref 0.44–1.00)
Chloride: 104 mmol/L (ref 98–111)
GFR calc Af Amer: >60 mL/min (ref >60)
GFR calc non Af Amer: >60 mL/min (ref >60)
GLUCOSE: 117 mg/dL — AB (ref 70–99)
Potassium: 3.3 mmol/L — ABNORMAL LOW (ref 3.5–5.1)
Sodium: 141 mmol/L (ref 135–145)
TOTAL PROTEIN: 6.7 g/dL (ref 6.5–8.1)

## 2018-06-11 LAB — URINALYSIS, ROUTINE W REFLEX MICROSCOPIC
Bilirubin Urine: NEGATIVE
Glucose, UA: NEGATIVE mg/dL
Ketones, ur: NEGATIVE mg/dL
Leukocytes, UA: NEGATIVE
NITRITE: NEGATIVE
PROTEIN: NEGATIVE mg/dL
SPECIFIC GRAVITY, URINE: 1.012 (ref 1.005–1.030)
pH: 6 (ref 5.0–8.0)

## 2018-06-11 LAB — I-STAT CG4 LACTIC ACID, ED
LACTIC ACID, VENOUS: 1.46 mmol/L (ref 0.5–1.9)
LACTIC ACID, VENOUS: 1.63 mmol/L (ref 0.5–1.9)

## 2018-06-11 LAB — CBC
HCT: 32.6 % — ABNORMAL LOW (ref 36.0–46.0)
Hemoglobin: 10 g/dL — ABNORMAL LOW (ref 12.0–15.0)
MCH: 26.5 pg (ref 26.0–34.0)
MCHC: 30.7 g/dL (ref 30.0–36.0)
MCV: 86.2 fL (ref 78.0–100.0)
PLATELETS: 295 10*3/uL (ref 150–400)
RBC: 3.78 MIL/uL — ABNORMAL LOW (ref 3.87–5.11)
RDW: 17.1 % — AB (ref 11.5–15.5)
WBC: 7.3 10*3/uL (ref 4.0–10.5)

## 2018-06-11 LAB — LIPASE, BLOOD: Lipase: 62 U/L — ABNORMAL HIGH (ref 11–51)

## 2018-06-11 IMAGING — CR DG CHEST 2V
2 series · 2 of 2 positions shown · non-contrast
Comparison: Two-view chest x-ray 04/20/2017

CLINICAL DATA: Right shoulder arthroplasty nine days ago. Fever and
cough. Nausea and vomiting.

EXAM:
CHEST - 2 VIEW

[chest lat]
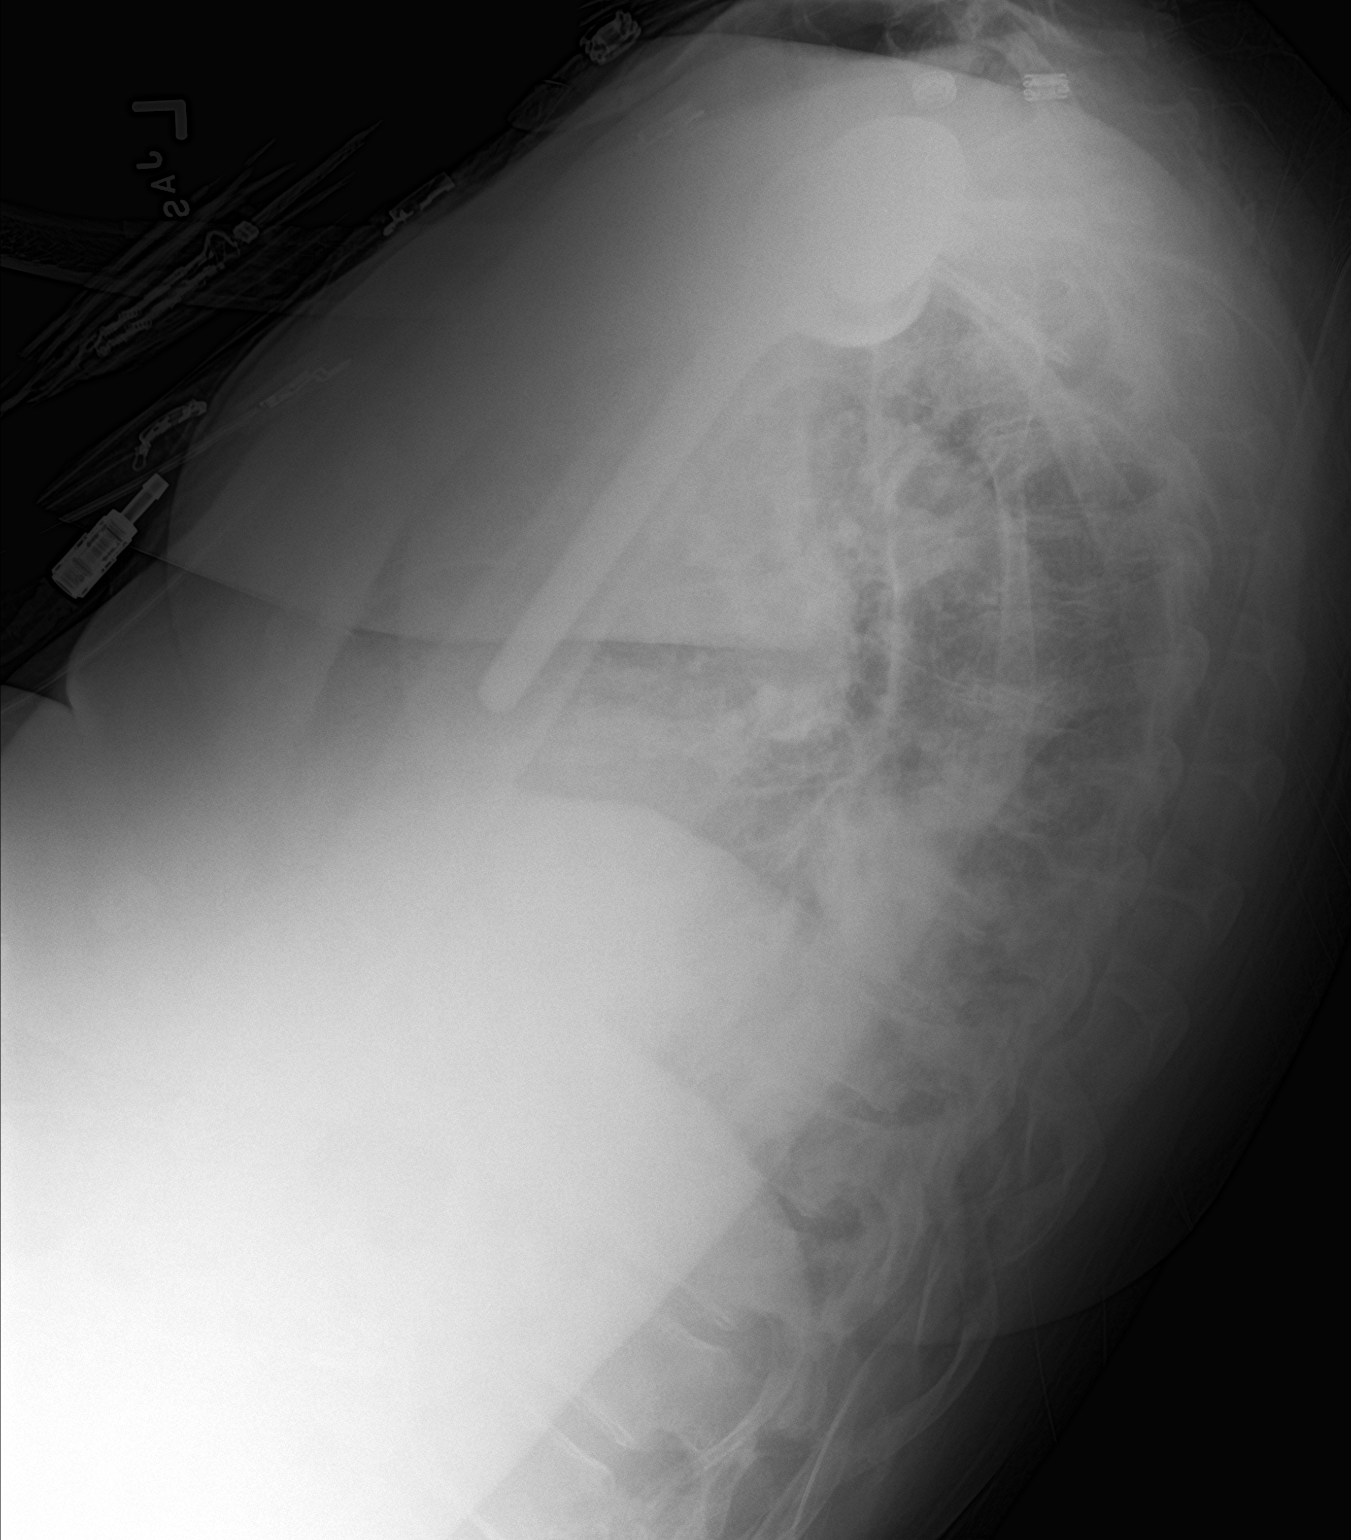

[chest ap]
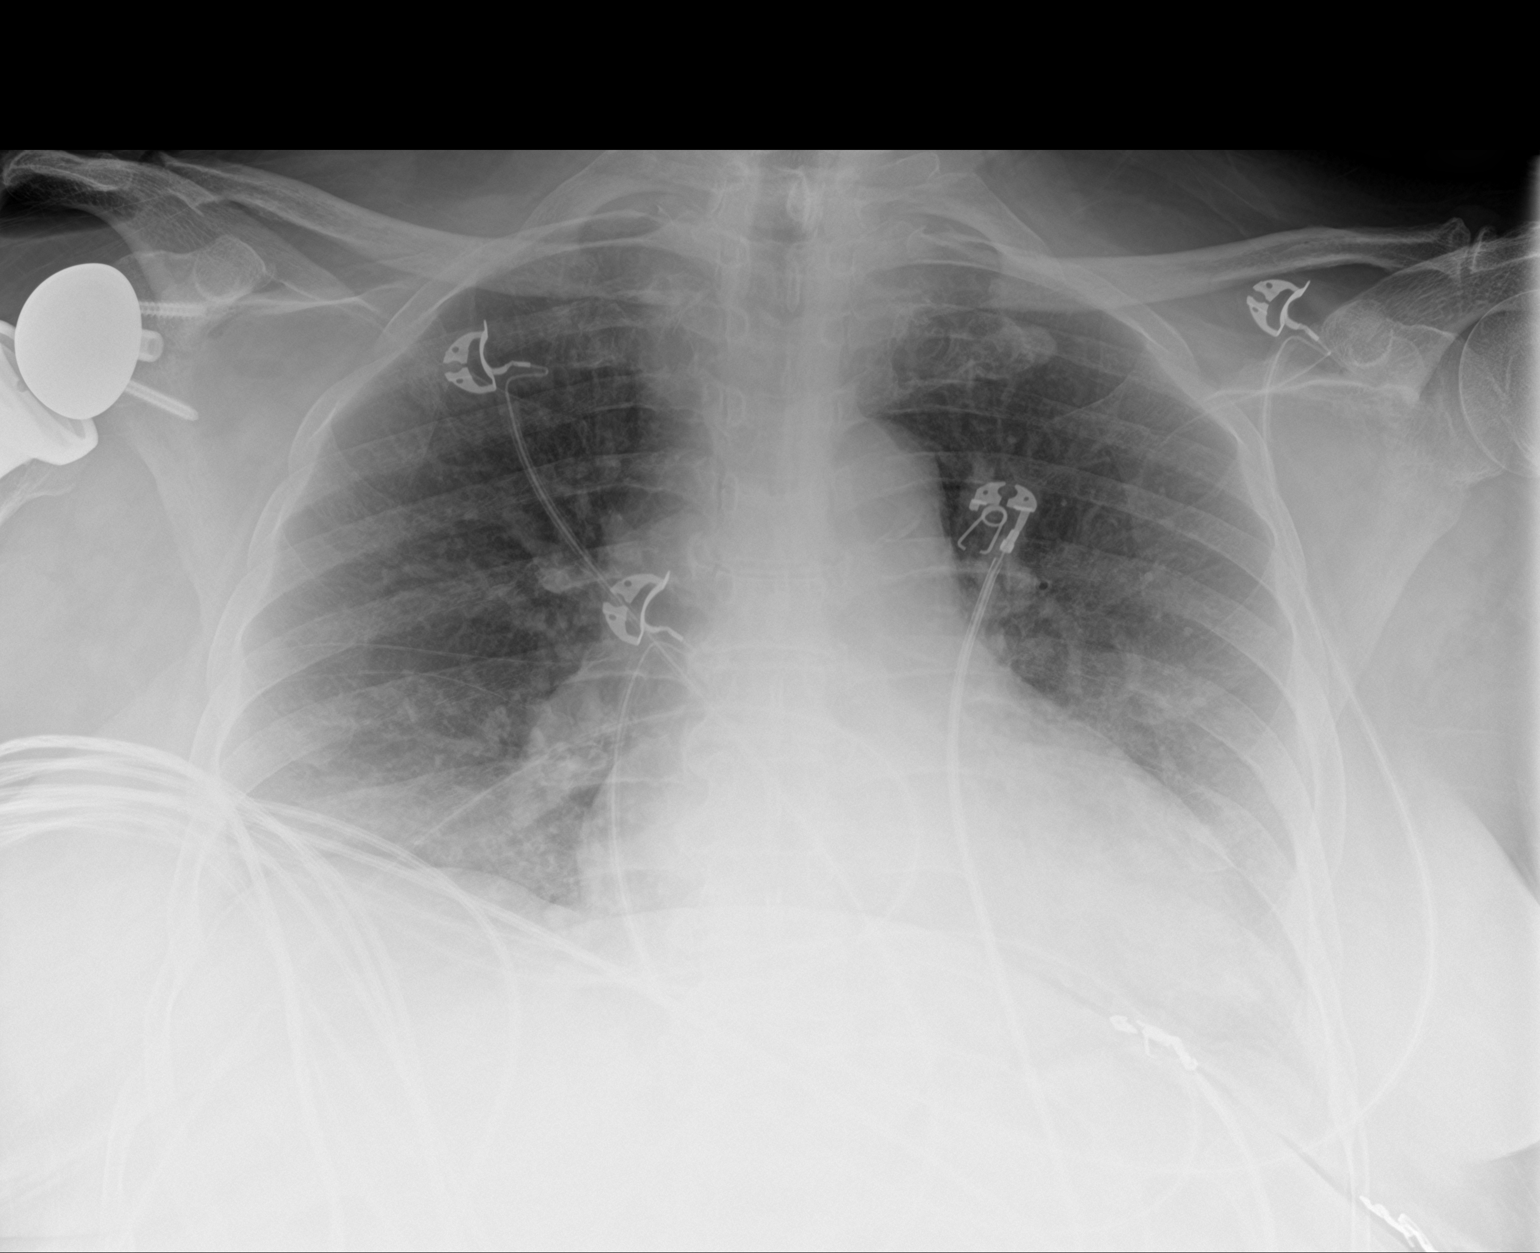

[2 of 2 positions shown; findings below may reference images not displayed]

FINDINGS: The heart size is exaggerate by low lung volumes. Mild pulmonary
vascular congestion is present. Right lower lobe airspace disease is
present. There is mild atelectasis on the left. The visualized soft
tissues and bony thorax are unremarkable.
IMPRESSION: 1. Right lower lobe airspace disease consistent with pneumonia.
2. Borderline cardiomegaly with mild pulmonary vascular congestion
and bibasilar atelectasis.

## 2018-06-11 MED ORDER — LEVOFLOXACIN IN D5W 750 MG/150ML IV SOLN
750.0000 mg | Freq: Once | INTRAVENOUS | Status: AC
  Administered 2018-06-11: 750 mg via INTRAVENOUS
  Filled 2018-06-11: qty 150

## 2018-06-11 MED ORDER — BENZONATATE 100 MG PO CAPS: 100.0000 mg | ORAL_CAPSULE | Freq: Three times a day (TID) | ORAL | Status: DC | PRN

## 2018-06-11 MED ORDER — GABAPENTIN 300 MG PO CAPS
300.0000 mg | ORAL_CAPSULE | Freq: Two times a day (BID) | ORAL | Status: DC
  Administered 2018-06-12 – 2018-06-14 (×6): 300 mg via ORAL
  Filled 2018-06-11 (×6): qty 1

## 2018-06-11 MED ORDER — FOLIC ACID 1 MG PO TABS
1.0000 mg | ORAL_TABLET | Freq: Every day | ORAL | Status: DC
  Administered 2018-06-12 – 2018-06-14 (×3): 1 mg via ORAL
  Filled 2018-06-11 (×3): qty 1

## 2018-06-11 MED ORDER — SODIUM CHLORIDE 0.9% FLUSH
3.0000 mL | Freq: Two times a day (BID) | INTRAVENOUS | Status: DC
  Administered 2018-06-12 – 2018-06-14 (×5): 3 mL via INTRAVENOUS

## 2018-06-11 MED ORDER — PANTOPRAZOLE SODIUM 40 MG PO TBEC
40.0000 mg | DELAYED_RELEASE_TABLET | Freq: Every day | ORAL | Status: DC
  Administered 2018-06-12 – 2018-06-14 (×3): 40 mg via ORAL
  Filled 2018-06-11 (×3): qty 1

## 2018-06-11 MED ORDER — SODIUM CHLORIDE 0.9 % IV BOLUS
1000.0000 mL | Freq: Once | INTRAVENOUS | Status: AC
  Administered 2018-06-11: 1000 mL via INTRAVENOUS

## 2018-06-11 MED ORDER — SODIUM CHLORIDE 0.9 % IV SOLN
2.0000 g | INTRAVENOUS | Status: DC
  Administered 2018-06-12 – 2018-06-14 (×3): 2 g via INTRAVENOUS
  Filled 2018-06-11 (×7): qty 20

## 2018-06-11 MED ORDER — LACTATED RINGERS IV SOLN
INTRAVENOUS | Status: AC
  Administered 2018-06-12: 01:00:00 via INTRAVENOUS

## 2018-06-11 MED ORDER — DULOXETINE HCL 30 MG PO CPEP
30.0000 mg | ORAL_CAPSULE | Freq: Two times a day (BID) | ORAL | Status: DC
  Administered 2018-06-12 – 2018-06-14 (×6): 30 mg via ORAL
  Filled 2018-06-11 (×7): qty 1

## 2018-06-11 MED ORDER — POTASSIUM CHLORIDE 20 MEQ/15ML (10%) PO SOLN
40.0000 meq | Freq: Once | ORAL | Status: AC
  Administered 2018-06-12: 40 meq via ORAL
  Filled 2018-06-11: qty 30

## 2018-06-11 MED ORDER — ENOXAPARIN SODIUM 40 MG/0.4ML ~~LOC~~ SOLN
40.0000 mg | SUBCUTANEOUS | Status: DC
  Administered 2018-06-12 – 2018-06-14 (×3): 40 mg via SUBCUTANEOUS
  Filled 2018-06-11 (×3): qty 0.4

## 2018-06-11 MED ORDER — ACETAMINOPHEN 500 MG PO TABS
1000.0000 mg | ORAL_TABLET | Freq: Three times a day (TID) | ORAL | Status: DC
  Administered 2018-06-12 – 2018-06-14 (×8): 1000 mg via ORAL
  Filled 2018-06-11 (×9): qty 2
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
## 2018-06-11 NOTE — H&P (Signed)
History and Physical   Diane [name] NAT:[id number] DOB: Aug 21, 1946 DOA: 06/11/2018  PCP: [name], MD  Chief Complaint: cough  HPI: this is a 72 year old woman with medical problems including obesity, mild aortic stenosis, hypertension, non-insulin-dependent diabetes, rheumatologic condition affecting the skin and hair taking methotrexate and hydroxychloroquine, peripheral neuropathy, and recent right shoulder replacement surgery on 06/02/2018 presenting with cough.  Patient is accompanied by her daughter who assists with the history. Reports underwent right shoulder replacement with orthopedic team, a few days after discharge developed a fever-orthopedic clinic recommended to take Tylenol 1 g 3 times a day. Fever persisted along with development of cough productive of clear green phlegm, primary care physician prescribed azithromycin as well as cough suppressant the Friday prior to admission. However, symptoms failed to improve, cough persisted associated with confusion, impaired memory, nausea and vomiting, and urinary incontinence associated with the coughing. Denies diarrhea, dysuria, abdominal pain.  At baseline, she is retired, never smoker never drinker of alcohol.  After surgery she took oxycodone as prescribed for the first 1-2 days, this discomfort. By auditory hallucinations and confusion, therefore she stopped.  ED Course: vital signs remarkable for systolic blood pressure 427C, respiratory 24, normal heart rate.  Diagnostics with labs CBC remarkable for hemoglobin 10, creatinine 0.79,  Potassium 3.3.  EKG with borderline prolonged QTC at 490 ms.  Chest x-ray revealed right lower airspace disease opacity consistent with pneumonia. She was treated with 1 L of normal saline as well as IV for quinolone therapy. Hospital medicine consult for further management.  Review of Systems: A complete ROS was obtained; pertinent positives negatives are denoted in the HPI. Otherwise, all  systems are negative.   Past Medical History:  Diagnosis Date  . Abnormal nuclear stress test 10/18/2013  . Arm pain 10/18/2013  . Arthritis   . Cataract    RIGHT EYE  . Chest pain 10/18/2013  . Diabetes mellitus    dx over 5 yrs ago  . GERD (gastroesophageal reflux disease)   . Heart murmur    "yrs ago in [place] -- been here since 1999, "  . History of hiatal hernia   . Hypertension   . Prolonged Q-T interval on ECG 04/20/2017  . Pulmonary edema 04/20/2017   Social History   Socioeconomic History  . Marital status: Widowed    Spouse name: Not on file  . Number of children: Not on file  . Years of education: Not on file  . Highest education level: Not on file  Occupational History  . Not on file  Social Needs  . Financial resource strain: Not on file  . Food insecurity:    Worry: Not on file    Inability: Not on file  . Transportation needs:    Medical: Not on file    Non-medical: Not on file  Tobacco Use  . Smoking status: Never Smoker  . Smokeless tobacco: Never Used  Substance and Sexual Activity  . Alcohol use: No  . Drug use: No  . Sexual activity: Not on file  Lifestyle  . Physical activity:    Days per week: Not on file    Minutes per session: Not on file  . Stress: Not on file  Relationships  . Social connections:    Talks on phone: Not on file    Gets together: Not on file    Attends religious service: Not on file    Active member of club or organization: Not on file  Attends meetings of clubs or organizations: Not on file    Relationship status: Not on file  . Intimate partner violence:    Fear of current or ex partner: Not on file    Emotionally abused: Not on file    Physically abused: Not on file    Forced sexual activity: Not on file  Other Topics Concern  . Not on file  Social History Narrative  . Not on file   Family History  Family history unknown: Yes   Daughter overweight.  Physical Exam: Vitals:   06/11/18 2045 06/11/18  2100 06/11/18 2115 06/11/18 2130  BP: 138/73 134/73 138/69 134/63  Pulse: 84 85 87 85  Resp: (!) 22 20 (!) 24 (!) 24  Temp:      TempSrc:      SpO2: 98% 100% 99% 97%  Height:       General: Appears mildly anxious, obese black woman, alert ENT: Grossly normal hearing, MMM. Cardiovascular: RRR. 2/6 systolic murmur RUSB. Respiratory: Globally reduced breath sounds likely 2/2 to body habitus, speaking in complete sentences without difficulty, on room air. Abdomen: Soft, non-tender. Skin: Hypopigmentation of upper back and scalp noted Musculoskeletal: Anterior right shoulder with adhesive bandage intact, no strike-through bleeding Psychiatric: Grossly normal mood and affect. Neurologic: Alert, answering questions appropriately  I have personally reviewed the following labs, culture data, and imaging studies.  Assessment/Plan:  #Pneumonia, bacterial suspected Course: fever with productive cough in setting of recent surgery on 06/02/2018, did not adequately improve on PO azithromycin in the outpatient setting, CURB65 score of 2 (age and confusion per family report) Plan: Given borderline prolonged QTc and recent right shoulder replacement, will avoid fluoroquinolone therapy for now. Will transition to ceftriaxone and monitor response. Suspect some degree of decreased inspiration / expansion of right lung may be at play given R shoulder surgery, incentive spirometry encouragement.  Tess perrles for cough suppressant.  #Other problems: -Anemia: hb of 10, suspect a degree of iron deficiency, check iron panel with AM labs -S/P right shoulder arthroplasty on 06/02/2018: PT/OT consulted, acetaminophen 1 g TID for pain control -HTN: will hold home ARB until clinical stability ensues -Aortic stenosis, mild: repeat TTE on 05/02/2018 did not reveal aortic stenosis -DM, non-insulin dependent: A1c within past year <7%, will hold home metformin, monitor with AM glucose -Peripheral neuropathy: continue home  gabapentin + duloxetine -Obesity: outpatient weight reduction -Autoimmune skin and hair condition:will hold MTX and HCQ for now in setting of active infection -Hypokalemia: K of 3.3 on admission, PO replacement  DVT prophylaxis: Subq Lovenox Code Status: full code - daughter confirms on admission Disposition Plan: Anticipate D/C home in 2-5 days Consults called: none, PT/OT ordered Admission status:  Admit to hospital medicine service   Cheri Rous, MD [facility] OIBB:[phone]  If 7PM-7AM, please contact night-coverage [email or web address] Password [email or web address]

## 2018-06-11 NOTE — ED Notes (Signed)
Attempted to call report, RN will return call for report  

## 2018-06-11 NOTE — ED Notes (Signed)
Pt unable to urinate with purwick in place, pt ok with in/out cath

## 2018-06-11 NOTE — ED Triage Notes (Signed)
Pt reports having surgery on her arm 8/2. Pt reports productive cough with green sputum. Pt spoke with doctor and was given antibiotic. Pt reports nausea, vomiting x 2 days. Pt reports some generalized abdominal pain. Daughter reports fever of 102 at home, has been treating it with Tylenol.

## 2018-06-12 ENCOUNTER — Encounter (HOSPITAL_COMMUNITY): Payer: Self-pay | Admitting: General Practice

## 2018-06-12 DIAGNOSIS — J181 Lobar pneumonia, unspecified organism: Principal | ICD-10-CM

## 2018-06-12 LAB — BASIC METABOLIC PANEL
ANION GAP: 12 (ref 5–15)
BUN: 8 mg/dL (ref 8–23)
CALCIUM: 8 mg/dL — AB (ref 8.9–10.3)
CO2: 21 mmol/L — AB (ref 22–32)
CREATININE: 0.67 mg/dL (ref 0.44–1.00)
Chloride: 109 mmol/L (ref 98–111)
GFR calc Af Amer: >60 mL/min (ref >60)
GLUCOSE: 130 mg/dL — AB (ref 70–99)
Potassium: 3.4 mmol/L — ABNORMAL LOW (ref 3.5–5.1)
Sodium: 142 mmol/L (ref 135–145)

## 2018-06-12 LAB — CBC
HCT: 27.5 % — ABNORMAL LOW (ref 36.0–46.0)
HEMOGLOBIN: 8.5 g/dL — AB (ref 12.0–15.0)
MCH: 26.2 pg (ref 26.0–34.0)
MCHC: 30.9 g/dL (ref 30.0–36.0)
MCV: 84.6 fL (ref 78.0–100.0)
PLATELETS: 272 10*3/uL (ref 150–400)
RBC: 3.25 MIL/uL — ABNORMAL LOW (ref 3.87–5.11)
RDW: 17 % — AB (ref 11.5–15.5)
WBC: 6.9 10*3/uL (ref 4.0–10.5)

## 2018-06-12 LAB — IRON AND TIBC
Iron: 14 ug/dL — ABNORMAL LOW (ref 28–170)
Saturation Ratios: 6 % — ABNORMAL LOW (ref 10.4–31.8)
TIBC: 223 ug/dL — ABNORMAL LOW (ref 250–450)
UIBC: 209 ug/dL

## 2018-06-12 LAB — MAGNESIUM: Magnesium: 2 mg/dL (ref 1.7–2.4)

## 2018-06-12 LAB — FERRITIN: Ferritin: 92 ng/mL (ref 11–307)

## 2018-06-12 LAB — FOLATE: Folate: 22.8 ng/mL

## 2018-06-12 LAB — VITAMIN B12: Vitamin B-12: 614 pg/mL (ref 180–914)

## 2018-06-12 MED ORDER — POTASSIUM CHLORIDE CRYS ER 20 MEQ PO TBCR
40.0000 meq | EXTENDED_RELEASE_TABLET | Freq: Once | ORAL | Status: AC
  Administered 2018-06-12: 40 meq via ORAL
  Filled 2018-06-12: qty 2

## 2018-06-12 MED ORDER — ENSURE ENLIVE PO LIQD
237.0000 mL | Freq: Two times a day (BID) | ORAL | Status: DC
  Administered 2018-06-12 – 2018-06-14 (×5): 237 mL via ORAL

## 2018-06-12 NOTE — Progress Notes (Signed)
Received patient from ED, AOx4, VSS, pain at 1/10 on right shoulder from previous surgery, ambulated to [facility] using FWW to void, oriented to room, bed controls and call light, gave crackers to eat and diet coke to drink.  Patient now resting on bed watching TV.  Will monitor.

## 2018-06-12 NOTE — Progress Notes (Signed)
PROGRESS NOTE    Diane [name]  PPJ:[id number] DOB: 18-Nov-1945 DOA: 06/11/2018 PCP: [name], MD   Brief Narrative:  this is a 72 year old woman with medical problems including obesity, mild aortic stenosis, hypertension, non-insulin-dependent diabetes, rheumatologic condition affecting the skin and hair taking methotrexate and hydroxychloroquine, peripheral neuropathy, and recent right shoulder replacement surgery on 06/02/2018 presenting with cough.  Patient is accompanied by her daughter who assists with the history. Reports underwent right shoulder replacement with orthopedic team, a few days after discharge developed a fever-orthopedic clinic recommended to take Tylenol 1 g 3 times a day. Fever persisted along with development of cough productive of clear green phlegm, primary care physician prescribed azithromycin as well as cough suppressant the Friday prior to admission. However, symptoms failed to improve, cough persisted associated with confusion, impaired memory, nausea and vomiting, and urinary incontinence associated with the coughing. Denies diarrhea, dysuria, abdominal pain.   Assessment & Plan:   Active Problems:   Pneumonia  #Pneumonia, bacterial suspected: fever with productive cough in setting of recent surgery on 06/02/2018, did not adequately improve on PO azithromycin in the outpatient setting, CURB65 score of 2 (age and confusion per family report).  Technically HCAP, but will treat with ceftriaxone for cap given stability. - ceftriaxone (sounds like pt has already received azithromycin) - sputum cx, urine strep, follow blood cx - incentive spirometer  Anemia: Hb decreased to 8.5 today.  Suspect dilutional.  Follow B12, folate.  Iron low.  Start iron.  No evidence of bleeding.  Follow.   S/P right shoulder arthroplasty on 06/02/2018: PT/OT consulted, acetaminophen 1 g TID for pain control - ortho alerted to admission  HTN: will hold home ARB until clinical  stability ensues.  BP stable, follow.  Aortic stenosis, mild: repeat TTE on 05/02/2018 did not reveal aortic stenosis  DM, non-insulin dependent: A1c within past year <7%, will hold home metformin, monitor with AM glucose  Peripheral neuropathy: continue home gabapentin + duloxetine  Obesity: outpatient weight reduction  Autoimmune skin and hair condition:will hold MTX and HCQ for now in setting of active infection  Hypokalemia: replete, follow mag  DVT prophylaxis: lovenox Code Status: full  Family Communication: discussed with daughter over phone Disposition Plan: possibly tomorrow   Consultants:   ortho  Procedures:   none  Antimicrobials: Anti-infectives (From admission, onward)   Start     Dose/Rate Route Frequency Ordered Stop   06/12/18 0800  cefTRIAXone (ROCEPHIN) 2 g in sodium chloride 0.9 % 100 mL IVPB     2 g 200 mL/hr over 30 Minutes Intravenous Every 24 hours 06/11/18 2324     06/11/18 2030  levofloxacin (LEVAQUIN) IVPB 750 mg     750 mg 100 mL/hr over 90 Minutes Intravenous  Once 06/11/18 2024 06/11/18 2334      Subjective: C/o cough.  Also didn't like way oxycodone made her feel. Denies any SOB.  Notes CP with cough.  Objective: Vitals:   06/12/18 0052 06/12/18 0100 06/12/18 0442 06/12/18 1418  BP: (!) 145/65  134/65 (!) 124/52  Pulse: 84  78 75  Resp: 20  18 18   Temp: 98.4 F (36.9 C)  98.2 F (36.8 C) 98.2 F (36.8 C)  TempSrc: Oral  Oral Oral  SpO2: 98%  97% 99%  Weight:  105.1 kg    Height:  5\' 8"  (1.727 m)      Intake/Output Summary (Last 24 hours) at 06/12/2018 1921 Last data filed at 06/12/2018 1823 Gross per 24 hour  Intake 1574.54 ml  Output -  Net 1574.54 ml   Filed Weights   06/12/18 0100  Weight: 105.1 kg    Examination:  General exam: Appears calm and comfortable  Respiratory system: Clear to auscultation. Respiratory effort normal. Cardiovascular system: S1 & S2 heard, RRR. Gastrointestinal system: Abdomen is  nondistended, soft and nontender. Central nervous system: Alert and oriented. No focal neurological deficits. Extremities: trace LEE.  Skin: No rashes, lesions or ulcers Psychiatry: Judgement and insight appear normal. Mood & affect appropriate.     Data Reviewed: I have personally reviewed following labs and imaging studies  CBC: Recent Labs  Lab 06/11/18 1952 06/12/18 0455  WBC 7.3 6.9  HGB 10.0* 8.5*  HCT 32.6* 27.5*  MCV 86.2 84.6  PLT 295 854   Basic Metabolic Panel: Recent Labs  Lab 06/11/18 1952 06/12/18 0455  NA 141 142  K 3.3* 3.4*  CL 104 109  CO2 25 21*  GLUCOSE 117* 130*  BUN 9 8  CREATININE 0.79 0.67  CALCIUM 8.4* 8.0*  MG  --  2.0   GFR: Estimated Creatinine Clearance: 80.7 mL/min (by C-G formula based on SCr of 0.67 mg/dL). Liver Function Tests: Recent Labs  Lab 06/11/18 1952  AST 47*  ALT 39  ALKPHOS 58  BILITOT 0.5  PROT 6.7  ALBUMIN 2.8*   Recent Labs  Lab 06/11/18 1952  LIPASE 62*   No results for input(s): AMMONIA in the last 168 hours. Coagulation Profile: No results for input(s): INR, PROTIME in the last 168 hours. Cardiac Enzymes: No results for input(s): CKTOTAL, CKMB, CKMBINDEX, TROPONINI in the last 168 hours. BNP (last 3 results) No results for input(s): PROBNP in the last 8760 hours. HbA1C: No results for input(s): HGBA1C in the last 72 hours. CBG: No results for input(s): GLUCAP in the last 168 hours. Lipid Profile: No results for input(s): CHOL, HDL, LDLCALC, TRIG, CHOLHDL, LDLDIRECT in the last 72 hours. Thyroid Function Tests: No results for input(s): TSH, T4TOTAL, FREET4, T3FREE, THYROIDAB in the last 72 hours. Anemia Panel: Recent Labs    06/12/18 0455  FERRITIN 92  TIBC 223*  IRON 14*   Sepsis Labs: Recent Labs  Lab 06/11/18 1941 06/11/18 2130  LATICACIDVEN 1.46 1.63    Recent Results (from the past 240 hour(s))  Culture, blood (routine x 2)     Status: None (Preliminary result)   Collection  Time: 06/11/18  8:17 PM  Result Value Ref Range Status   Specimen Description BLOOD RIGHT HAND  Final   Special Requests   Final    BOTTLES DRAWN AEROBIC AND ANAEROBIC Blood Culture adequate volume   Culture   Final    NO GROWTH < 24 HOURS Performed at [facility], [place] [address], [place], [place] [address]    Report Status PENDING  Incomplete  Culture, blood (routine x 2)     Status: None (Preliminary result)   Collection Time: 06/11/18  9:20 PM  Result Value Ref Range Status   Specimen Description BLOOD LEFT HAND  Final   Special Requests   Final    BOTTLES DRAWN AEROBIC AND ANAEROBIC Blood Culture results may not be optimal due to an inadequate volume of blood received in culture bottles   Culture   Final    NO GROWTH < 24 HOURS Performed at [facility], [place] [address], [place],  [address]    Report Status PENDING  Incomplete         Radiology Studies:  Dg Chest 2 View  Result Date: 06/11/2018 CLINICAL DATA:  Right shoulder arthroplasty nine days ago. Fever and cough. Nausea and vomiting. EXAM: CHEST - 2 VIEW COMPARISON:  Two-view chest x-ray 04/20/2017 FINDINGS: The heart size is exaggerate by low lung volumes. Mild pulmonary vascular congestion is present. Right lower lobe airspace disease is present. There is mild atelectasis on the left. The visualized soft tissues and bony thorax are unremarkable. IMPRESSION: 1. Right lower lobe airspace disease consistent with pneumonia. 2. Borderline cardiomegaly with mild pulmonary vascular congestion and bibasilar atelectasis. Electronically Signed   By: San Morelle M.D.   On: 06/11/2018 20:19        Scheduled Meds: . acetaminophen  1,000 mg Oral TID  . DULoxetine  30 mg Oral BID  . enoxaparin (LOVENOX) injection  40 mg Subcutaneous Q24H  . feeding supplement (ENSURE ENLIVE)  237 mL Oral BID BM  . folic acid  1 mg Oral Daily  . gabapentin  300 mg Oral BID  . pantoprazole  40 mg Oral Daily  . sodium  chloride flush  3 mL Intravenous Q12H   Continuous Infusions: . cefTRIAXone (ROCEPHIN)  IV 2 g (06/12/18 0754)     LOS: 1 day    Time spent: over 30 min MDM moderate with HCAP, worsening anemia.   Fayrene Helper, MD [facility] Pager [phone]  If 7PM-7AM, please contact night-coverage [email or web address] Password [facility] 06/12/2018, 7:21 PM

## 2018-06-12 NOTE — Evaluation (Signed)
Physical Therapy Evaluation Patient Details Name: Diane [name] MRN: [id number] DOB: 1946-06-23 Today's Date: 06/12/2018   History of Present Illness  72 yo female admitted with PNA. PMHx including arthritis, cataracts, DM, GERD, HTN, and pulmonary edema, Right reverse shoulder arthroplasty 06/02/18  Clinical Impression  Pt admitted with above diagnosis. Pt currently with functional limitations due to the deficits listed below (see PT Problem List). Pt ambulated 100' with min HHA and min A from behind for safety. She was unable to let go of OT's hand with gait and will need a SPC for home and will plan to train with it next session.  Pt will benefit from skilled PT to increase their independence and safety with mobility to allow discharge to the venue listed below.       Follow Up Recommendations Home health PT;Supervision for mobility/OOB    Equipment Recommendations  Cane    Recommendations for Other Services       Precautions / Restrictions Precautions Precautions: Shoulder Shoulder Interventions: Shoulder sling/immobilizer;At all times;Off for dressing/bathing/exercises(daughter to bring sling) Precaution Comments: AROM of elbow, wrist, and hand. PROM/AROM of shoulder with gentle lap slides and pendulums as well as gentle ROM for ADLs. Watch subscap repair with no pushing or weight bearing.  Required Braces or Orthoses: Sling Restrictions Weight Bearing Restrictions: Yes RUE Weight Bearing: Non weight bearing Other Position/Activity Restrictions: NO pushing or load bearing, may perform gentle ADLs with elbow close per MD instructions      Mobility  Bed Mobility Overal bed mobility: Needs Assistance Bed Mobility: Supine to Sit     Supine to sit: Supervision;HOB elevated     General bed mobility comments: pt pivoted from HPB elevated position, reports that she has been staying in lift chair recliner because bed mobility has been hard  Transfers Overall transfer level:  Needs assistance Equipment used: 1 person hand held assist Transfers: Sit to/from Stand Sit to Stand: Min assist         General transfer comment: pt initially unsteady  Ambulation/Gait Ambulation/Gait assistance: +2 safety/equipment;Min assist Gait Distance (Feet): 100 Feet Assistive device: 1 person hand held assist Gait Pattern/deviations: Step-through pattern;Decreased stride length Gait velocity: decreased Gait velocity interpretation: <1.31 ft/sec, indicative of household ambulator General Gait Details: very short step length. L HHA with guarding from behind but pt without LOB once she got moving. All mvmts very slow with stopping after each step. With cues to increase pace, her gait pattern improved and became more fluid but she quickly reverts to slow stilted pattern  Stairs            Wheelchair Mobility    Modified Rankin (Stroke Patients Only)       Balance Overall balance assessment: Needs assistance Sitting-balance support: Feet supported;No upper extremity supported Sitting balance-Leahy Scale: Good     Standing balance support: Single extremity supported Standing balance-Leahy Scale: Poor Standing balance comment: reliant on one UE A when up on feet                             Pertinent Vitals/Pain Pain Assessment: Faces Faces Pain Scale: Hurts a little bit Pain Location: R scapula area Pain Descriptors / Indicators: Sore Pain Intervention(s): Limited activity within patient's tolerance;Monitored during session    Home Living Family/patient expects to be discharged to:: Private residence Living Arrangements: Children Available Help at Discharge: Family Type of Home: House Home Access: Stairs to enter   [employer] of  Steps: 2 Home Layout: Two level;Full bath on main level;Bed/bath upstairs;1/2 bath on main level Home Equipment: None      Prior Function Level of Independence: Independent         Comments: pt was  independent prior to TSA, since then her daughter has been coming over every other day to help her up the stairs to bathe. Her other daughter has been living with her since her husband passed away last year     Hand Dominance   Dominant Hand: Right    Extremity/Trunk Assessment   Upper Extremity Assessment Upper Extremity Assessment: Defer to OT evaluation RUE Deficits / Details: s/p right reverse shoulder arthroplasty. Limited ROM at shoulder. Elbow distally WNL RUE Coordination: decreased gross motor    Lower Extremity Assessment Lower Extremity Assessment: Overall WFL for tasks assessed    Cervical / Trunk Assessment Cervical / Trunk Assessment: Normal  Communication   Communication: No difficulties  Cognition Arousal/Alertness: Awake/alert Behavior During Therapy: WFL for tasks assessed/performed Overall Cognitive Status: Within Functional Limits for tasks assessed                                 General Comments: Pt requiring increased cues and moving slowly.      General Comments      Exercises Other Exercises Other Exercises: Pt performed 10 reps of lap slides, elbow-wrist-forearm-hand exercises (all sitting); 10 reps each of all 4 pendulum exercises in standing   Assessment/Plan    PT Assessment Patient needs continued PT services  PT Problem List Decreased mobility;Decreased activity tolerance;Decreased knowledge of use of DME;Pain       PT Treatment Interventions DME instruction;Gait training;Stair training;Functional mobility training;Therapeutic activities;Therapeutic exercise;Patient/family education;Balance training    PT Goals (Current goals can be found in the Care Plan section)  Acute Rehab PT Goals Patient Stated Goal: to go home PT Goal Formulation: With patient Time For Goal Achievement: 06/26/18 Potential to Achieve Goals: Good    Frequency Min 3X/week   Barriers to discharge        Co-evaluation PT/OT/SLP  Co-Evaluation/Treatment: Yes Reason for Co-Treatment: To address functional/ADL transfers PT goals addressed during session: Mobility/safety with mobility;Balance         AM-PAC PT "6 Clicks" Daily Activity  Outcome Measure Difficulty turning over in bed (including adjusting bedclothes, sheets and blankets)?: Unable Difficulty moving from lying on back to sitting on the side of the bed? : Unable Difficulty sitting down on and standing up from a chair with arms (e.g., wheelchair, bedside commode, etc,.)?: Unable Help needed moving to and from a bed to chair (including a wheelchair)?: A Little Help needed walking in hospital room?: A Little Help needed climbing 3-5 steps with a railing? : A Little 6 Click Score: 12    End of Session Equipment Utilized During Treatment: Gait belt Activity Tolerance: Patient tolerated treatment well Patient left: in bed;with call bell/phone within reach Nurse Communication: Mobility status PT Visit Diagnosis: Other abnormalities of gait and mobility (R26.89);Pain Pain - Right/Left: Right Pain - part of body: (upper back)    Time: 1950-9326 PT Time Calculation (min) (ACUTE ONLY): 22 min   Charges:   PT Evaluation $PT Eval Moderate Complexity: Petros, PT  Acute Rehab Services  [place] 06/12/2018, 12:49 PM

## 2018-06-12 NOTE — Care Management Note (Addendum)
Case Management Note  Patient Details  Name: Diane [name] MRN: [id number] Date of Birth: Oct 06, 1946  Subjective/Objective:                    Action/Plan:  Confirmed face sheet information with patient and daughter at bedside. Patient is able to fill prescriptions and has transportation.  Karel Jarvis with [facility] to order [facility], HHPT/OT referral given to [facility] with [facility]   Expected Discharge Date:                  Expected Discharge Plan:  [place]  In-House Referral:     Discharge planning Services  CM Consult  Post Acute Care Choice:  Durable Medical Equipment, Home Health Choice offered to:  Adult Children, Patient  DME Arranged:  Kasandra Knudsen DME Agency:  [place] Arranged:  PT, OT [facility] Agency:  [place]  Status of Service:  Completed, signed off  If discussed at [place] of Stay Meetings, dates discussed:    Additional Comments:  Marilu Favre, RN 06/12/2018, 2:49 PM

## 2018-06-12 NOTE — Evaluation (Signed)
Occupational Therapy Evaluation Patient Details Name: TASHEBA HENSON MRN: [id number] DOB: 1946-03-13 Today's Date: 06/12/2018    History of Present Illness 72 yo female admitted with PNA. PMHx including arthritis, cataracts, DM, GERD, HTN, and pulmonary edema, Right reverse shoulder arthroplasty 06/02/18   Clinical Impression   This 72 yo female admitted with above as well as recent right shoulder surgery presents to acute OT (reports not having done her RUE exercises since D/C due to hallucinations/confusion with taking pain meds and then being readmitted). Due to recent shoulder sx pt with decreased independence with basic ADLs. (children have been A'ing her) and decreased balance. She will benefit from acute OT with follow up [place] post D/C.     Follow Up Recommendations  Home health OT;Supervision/Assistance - 24 hour    Equipment Recommendations  None recommended by OT       Precautions / Restrictions Precautions Precautions: Shoulder Shoulder Interventions: Shoulder sling/immobilizer;At all times;Off for dressing/bathing/exercises(pt's slings are at home, dtr to bring one of them in) Precaution Comments: AROM of elbow, wrist, and hand. PROM/AROM of shoulder with gentle lap slides and pendulums as well as gentle ROM for ADLs. Watch subscap repair with no pushing or weight bearing.  Restrictions Weight Bearing Restrictions: Yes RUE Weight Bearing: Non weight bearing      Mobility Bed Mobility Overal bed mobility: Needs Assistance Bed Mobility: Supine to Sit     Supine to sit: Supervision;HOB elevated        Transfers Overall transfer level: Needs assistance Equipment used: 1 person hand held assist Transfers: Sit to/from Stand Sit to Stand: Min assist         General transfer comment: pt initially unsteady    Balance Overall balance assessment: Needs assistance Sitting-balance support: Feet supported;No upper extremity supported Sitting balance-Leahy Scale:  Good       Standing balance-Leahy Scale: Poor Standing balance comment: reliant on one UE A when up on feet                           ADL either performed or assessed with clinical judgement   ADL Overall ADL's : Needs assistance/impaired Eating/Feeding: Modified independent;Sitting   Grooming: Minimal assistance;Sitting   Upper Body Bathing: Minimal assistance;Sitting   Lower Body Bathing: Moderate assistance Lower Body Bathing Details (indicate cue type and reason): min A sit<>stand Upper Body Dressing : Moderate assistance;Sitting   Lower Body Dressing: Maximal assistance Lower Body Dressing Details (indicate cue type and reason): min A sit<>stand Toilet Transfer: Minimal assistance;Ambulation Toilet Transfer Details (indicate cue type and reason): HHA +1 Toileting- Clothing Manipulation and Hygiene: Moderate assistance Toileting - Clothing Manipulation Details (indicate cue type and reason): min A sit<>stand             Vision Baseline Vision/History: Wears glasses Wears Glasses: Reading only Patient Visual Report: No change from baseline              Pertinent Vitals/Pain Pain Assessment: 0-10 Faces Pain Scale: Hurts a little bit Pain Location: R scapula area Pain Descriptors / Indicators: Sore Pain Intervention(s): Limited activity within patient's tolerance;Monitored during session(left heat pack for NT to pop and place once pt completed bath)     Hand Dominance Right   Extremity/Trunk Assessment Upper Extremity Assessment Upper Extremity Assessment: RUE deficits/detail RUE Deficits / Details: s/p right reverse shoulder arthroplasty. Limited ROM at shoulder. Elbow distally WNL RUE Coordination: decreased gross motor  Communication Communication Communication: No difficulties   Cognition Arousal/Alertness: Awake/alert Behavior During Therapy: WFL for tasks assessed/performed Overall Cognitive Status: Within Functional Limits  for tasks assessed                                        Exercises Other Exercises Other Exercises: Pt performed 10 reps of lap slides, elbow-wrist-forearm-hand exercises (all sitting); 10 reps each of all 4 pendulum exercises in standing        Home Living Family/patient expects to be discharged to:: Private residence Living Arrangements: Children Available Help at Discharge: Family Type of Home: House Home Access: Stairs to enter [employer] of Steps: 2   Home Layout: Two level;Full bath on main level;Bed/bath upstairs;1/2 bath on main level   Alternate Level Stairs-Rails: Right;Left Bathroom Shower/Tub: Teacher, early years/pre: Standard     Home Equipment: None          Prior Functioning/Environment Level of Independence: Independent                 OT Problem List: Decreased strength;Decreased activity tolerance;Impaired UE functional use;Decreased range of motion;Decreased coordination;Decreased knowledge of precautions;Impaired balance (sitting and/or standing);Pain      OT Treatment/Interventions: Self-care/ADL training;Therapeutic exercise;Patient/family education    OT Goals(Current goals can be found in the care plan section) Acute Rehab OT Goals Patient Stated Goal: to go home OT Goal Formulation: With patient Time For Goal Achievement: 06/26/18 Potential to Achieve Goals: Good  OT Frequency: Min 3X/week           Co-evaluation PT/OT/SLP Co-Evaluation/Treatment: Yes(partial) Reason for Co-Treatment: To address functional/ADL transfers;For patient/therapist safety          AM-PAC PT "6 Clicks" Daily Activity     Outcome Measure Help from another person eating meals?: None Help from another person taking care of personal grooming?: A Little Help from another person toileting, which includes using toliet, bedpan, or urinal?: A Lot Help from another person bathing (including washing, rinsing, drying)?: A  Lot Help from another person to put on and taking off regular upper body clothing?: A Lot Help from another person to put on and taking off regular lower body clothing?: A Lot 6 Click Score: 15   End of Session Equipment Utilized During Treatment: Gait belt Nurse Communication: (NT: applying heat packs to right scapula area post bath, how to help pt bath under right arm)  Activity Tolerance: Patient tolerated treatment well Patient left: (sitting EOB with NT just entering room to A with bath)  OT Visit Diagnosis: Unsteadiness on feet (R26.81);Muscle weakness (generalized) (M62.81);Pain Pain - Right/Left: Right Pain - part of body: (scapula area)                Time: 7353-2992 OT Time Calculation (min): 46 min Charges:  OT General Charges $OT Visit: 1 Visit OT Evaluation $OT Eval Moderate Complexity: 1 Mod OT Treatments $Therapeutic Exercise: 8-22 mins  [address], OTR/L [phone] 06/12/2018

## 2018-06-13 LAB — BASIC METABOLIC PANEL
Anion gap: 8 (ref 5–15)
BUN: 5 mg/dL — AB (ref 8–23)
CO2: 26 mmol/L (ref 22–32)
Calcium: 8.4 mg/dL — ABNORMAL LOW (ref 8.9–10.3)
Chloride: 110 mmol/L (ref 98–111)
Creatinine, Ser: 0.7 mg/dL (ref 0.44–1.00)
GFR calc Af Amer: >60 mL/min (ref >60)
GLUCOSE: 112 mg/dL — AB (ref 70–99)
POTASSIUM: 3.5 mmol/L (ref 3.5–5.1)
Sodium: 144 mmol/L (ref 135–145)

## 2018-06-13 LAB — CBC
HCT: 28.4 % — ABNORMAL LOW (ref 36.0–46.0)
Hemoglobin: 8.8 g/dL — ABNORMAL LOW (ref 12.0–15.0)
MCH: 26.1 pg (ref 26.0–34.0)
MCHC: 31 g/dL (ref 30.0–36.0)
MCV: 84.3 fL (ref 78.0–100.0)
PLATELETS: 315 10*3/uL (ref 150–400)
RBC: 3.37 MIL/uL — ABNORMAL LOW (ref 3.87–5.11)
RDW: 16.8 % — AB (ref 11.5–15.5)
WBC: 6.6 10*3/uL (ref 4.0–10.5)

## 2018-06-13 LAB — EXPECTORATED SPUTUM ASSESSMENT W REFEX TO RESP CULTURE

## 2018-06-13 LAB — MRSA PCR SCREENING: MRSA by PCR: NEGATIVE

## 2018-06-13 LAB — URINE CULTURE: CULTURE: NO GROWTH

## 2018-06-13 LAB — STREP PNEUMONIAE URINARY ANTIGEN: Strep Pneumo Urinary Antigen: NEGATIVE

## 2018-06-13 LAB — EXPECTORATED SPUTUM ASSESSMENT W GRAM STAIN, RFLX TO RESP C

## 2018-06-13 LAB — MAGNESIUM: Magnesium: 2 mg/dL (ref 1.7–2.4)

## 2018-06-13 MED ORDER — ADULT MULTIVITAMIN W/MINERALS CH
1.0000 | ORAL_TABLET | Freq: Every day | ORAL | Status: DC
  Administered 2018-06-13 – 2018-06-14 (×2): 1 via ORAL
  Filled 2018-06-13 (×2): qty 1

## 2018-06-13 NOTE — Plan of Care (Signed)
  Problem: Health Behavior/Discharge Planning: Goal: Ability to manage health-related needs will improve Outcome: Progressing   Problem: Clinical Measurements: Goal: Respiratory complications will improve Outcome: Progressing Goal: Cardiovascular complication will be avoided Outcome: Progressing

## 2018-06-13 NOTE — Progress Notes (Signed)
PROGRESS NOTE    Diane [name]  DEY:[id number] DOB: 1946-02-17 DOA: 06/11/2018 PCP: [name], MD   Brief Narrative:  this is a 72 year old woman with medical problems including obesity, mild aortic stenosis, hypertension, non-insulin-dependent diabetes, rheumatologic condition affecting the skin and hair taking methotrexate and hydroxychloroquine, peripheral neuropathy, and recent right shoulder replacement surgery on 06/02/2018 presenting with cough.  Patient is accompanied by her daughter who assists with the history. Reports underwent right shoulder replacement with orthopedic team, a few days after discharge developed a fever-orthopedic clinic recommended to take Tylenol 1 g 3 times a day. Fever persisted along with development of cough productive of clear green phlegm, primary care physician prescribed azithromycin as well as cough suppressant the Friday prior to admission. However, symptoms failed to improve, cough persisted associated with confusion, impaired memory, nausea and vomiting, and urinary incontinence associated with the coughing. Denies diarrhea, dysuria, abdominal pain.   Assessment & Plan:   Active Problems:   Community acquired pneumonia of right lower lobe of lung ([place])  #Pneumonia, bacterial suspected: fever with productive cough in setting of recent surgery on 06/02/2018, did not adequately improve on PO azithromycin in the outpatient setting, CURB65 score of 2 (age and confusion per family report).  Technically HCAP, but will treat with ceftriaxone for cap given stability. - Continue ceftriaxone (sounds like pt has already received azithromycin) and transition to PO tomorrow - sputum cx (pending), urine strep (negative), follow blood cx NGTDx2 days - incentive spirometer  Anemia: Hb stable to 8.8 today.  Suspect dilutional.  Follow B12, folate (normal).  Iron low.  Start iron.  No evidence of bleeding.  Follow.   S/P right shoulder arthroplasty on  06/02/2018: PT/OT consulted, acetaminophen 1 g TID for pain control - ortho aware of admission, discussed yesterday 8/12  HTN: continue to hold arb.  BP stable, follow.  Aortic stenosis, mild: repeat TTE on 05/02/2018 did not reveal aortic stenosis  DM, non-insulin dependent: A1c within past year <7%, will hold home metformin, monitor with AM glucose.  Stable.   Peripheral neuropathy: continue home gabapentin + duloxetine  Obesity: outpatient weight reduction  Autoimmune skin and hair condition:will hold MTX and HCQ for now in setting of active infection  Hypokalemia: improved, follow  Gait Difficulty: on discussion with pt and daughter, pt has had recurrent falls since her husband passed away last 08-08-2023.  Seems to have declined in some ways since then.  Discussed her mood, which she denies depression (she's on cymbalta).  She had CT scan for "weakness and poor movement" on 6/28 which was notable for chronic small vessel ischemic changes, but no acute abnormalities.  She describes episodes of "freezing" to me and her daughter notes some memory problems.  ?parkinsonism.  Doesn't have shuffling gait from her description and I did not note obvious pill rolling tremor, but would be worth neurology follow up as outpatient.   DVT prophylaxis: lovenox Code Status: full  Family Communication: discussed with daughter at bedside Disposition Plan: likely SNF when bed available   Consultants:   ortho  Procedures:   none  Antimicrobials: Anti-infectives (From admission, onward)   Start     Dose/Rate Route Frequency Ordered Stop   06/12/18 0800  cefTRIAXone (ROCEPHIN) 2 g in sodium chloride 0.9 % 100 mL IVPB     2 g 200 mL/hr over 30 Minutes Intravenous Every 24 hours 06/11/18 2324     06/11/18 2030  levofloxacin (LEVAQUIN) IVPB 750 mg     750 mg  100 mL/hr over 90 Minutes Intravenous  Once 06/11/18 2024 06/11/18 2334      Subjective: Feeling ebtter. Appetite better. Feels like she's  moving slow at time. Freezes.    Objective: Vitals:   06/12/18 1418 06/12/18 2153 06/13/18 0519 06/13/18 1351  BP: (!) 124/52 (!) 152/72 (!) 143/76 139/67  Pulse: 75 81 75 77  Resp: 18  18 17   Temp: 98.2 F (36.8 C) 98.3 F (36.8 C) 98 F (36.7 C) 98.2 F (36.8 C)  TempSrc: Oral Oral Oral Oral  SpO2: 99% 98% 99% 99%  Weight:      Height:        Intake/Output Summary (Last 24 hours) at 06/13/2018 1710 Last data filed at 06/13/2018 1415 Gross per 24 hour  Intake 150 ml  Output 400 ml  Net -250 ml   Filed Weights   06/12/18 0100  Weight: 105.1 kg    Examination:  General: No acute distress. Cardiovascular: Heart sounds show a regular rate, and rhythm.  Lungs: Clear to auscultation bilaterally with good air movement. No rales, rhonchi or wheezes. Abdomen: Soft, nontender, nondistended Neurological: Alert and oriented 3. Moves all extremities 4 with equal strength (other than RUE which was deferred). Cranial nerves II through XII intact. Skin: Warm and dry. No rashes or lesions. Extremities: No clubbing or cyanosis. No edema. Pedal pulses 2+. Psychiatric: Mood and affect are normal. Insight and judgment are appropriate.    Data Reviewed: I have personally reviewed following labs and imaging studies  CBC: Recent Labs  Lab 06/11/18 1952 06/12/18 0455 06/13/18 0500  WBC 7.3 6.9 6.6  HGB 10.0* 8.5* 8.8*  HCT 32.6* 27.5* 28.4*  MCV 86.2 84.6 84.3  PLT 295 272 923   Basic Metabolic Panel: Recent Labs  Lab 06/11/18 1952 06/12/18 0455 06/13/18 0500  NA 141 142 144  K 3.3* 3.4* 3.5  CL 104 109 110  CO2 25 21* 26  GLUCOSE 117* 130* 112*  BUN 9 8 5*  CREATININE 0.79 0.67 0.70  CALCIUM 8.4* 8.0* 8.4*  MG  --  2.0 2.0   GFR: Estimated Creatinine Clearance: 80.7 mL/min (by C-G formula based on SCr of 0.7 mg/dL). Liver Function Tests: Recent Labs  Lab 06/11/18 1952  AST 47*  ALT 39  ALKPHOS 58  BILITOT 0.5  PROT 6.7  ALBUMIN 2.8*   Recent Labs  Lab  06/11/18 1952  LIPASE 62*   No results for input(s): AMMONIA in the last 168 hours. Coagulation Profile: No results for input(s): INR, PROTIME in the last 168 hours. Cardiac Enzymes: No results for input(s): CKTOTAL, CKMB, CKMBINDEX, TROPONINI in the last 168 hours. BNP (last 3 results) No results for input(s): PROBNP in the last 8760 hours. HbA1C: No results for input(s): HGBA1C in the last 72 hours. CBG: No results for input(s): GLUCAP in the last 168 hours. Lipid Profile: No results for input(s): CHOL, HDL, LDLCALC, TRIG, CHOLHDL, LDLDIRECT in the last 72 hours. Thyroid Function Tests: No results for input(s): TSH, T4TOTAL, FREET4, T3FREE, THYROIDAB in the last 72 hours. Anemia Panel: Recent Labs    06/12/18 0455 06/12/18 2017  VITAMINB12  --  614  FOLATE  --  22.8  FERRITIN 92  --   TIBC 223*  --   IRON 14*  --    Sepsis Labs: Recent Labs  Lab 06/11/18 1941 06/11/18 2130  LATICACIDVEN 1.46 1.63    Recent Results (from the past 240 hour(s))  Culture, blood (routine x 2)  Status: None (Preliminary result)   Collection Time: 06/11/18  8:17 PM  Result Value Ref Range Status   Specimen Description BLOOD RIGHT HAND  Final   Special Requests   Final    BOTTLES DRAWN AEROBIC AND ANAEROBIC Blood Culture adequate volume   Culture   Final    NO GROWTH 2 DAYS Performed at [facility], [address], [place], [place] [address]    Report Status PENDING  Incomplete  Culture, blood (routine x 2)     Status: None (Preliminary result)   Collection Time: 06/11/18  9:20 PM  Result Value Ref Range Status   Specimen Description BLOOD LEFT HAND  Final   Special Requests   Final    BOTTLES DRAWN AEROBIC AND ANAEROBIC Blood Culture results may not be optimal due to an inadequate volume of blood received in culture bottles   Culture   Final    NO GROWTH 2 DAYS Performed at [facility], [place] [address] [place], [place] [address]    Report Status PENDING   Incomplete  Urine culture     Status: None   Collection Time: 06/11/18 10:18 PM  Result Value Ref Range Status   Specimen Description URINE, CATHETERIZED  Final   Special Requests NONE  Final   Culture   Final    NO GROWTH Performed at [facility], [address] [place], [place] [address]    Report Status 06/13/2018 FINAL  Final  Culture, sputum-assessment     Status: None   Collection Time: 06/12/18  6:44 PM  Result Value Ref Range Status   Specimen Description EXPECTORATED SPUTUM  Final   Special Requests NONE  Final   Sputum evaluation   Final    THIS SPECIMEN IS ACCEPTABLE FOR SPUTUM CULTURE Performed at [facility], [place] [address] [place], [place] [address]    Report Status 06/13/2018 FINAL  Final  Culture, respiratory     Status: None (Preliminary result)   Collection Time: 06/12/18  6:44 PM  Result Value Ref Range Status   Specimen Description EXPECTORATED SPUTUM  Final   Special Requests NONE Reflexed from [id number]  Final   Gram Stain   Final    MODERATE WBC PRESENT,BOTH PMN AND MONONUCLEAR NO ORGANISMS SEEN Performed at [facility], [place] [address], [place], [place] [address]    Culture PENDING  Incomplete   Report Status PENDING  Incomplete  MRSA PCR Screening     Status: None   Collection Time: 06/12/18 11:25 PM  Result Value Ref Range Status   MRSA by PCR NEGATIVE NEGATIVE Final    Comment:        The GeneXpert MRSA Assay (FDA approved for NASAL specimens only), is one component of a comprehensive MRSA colonization surveillance program. It is not intended to diagnose MRSA infection nor to guide or monitor treatment for MRSA infections. Performed at [facility], [place] [address], [place], [place] [address]          Radiology Studies: Dg Chest 2 View  Result Date: 06/11/2018 CLINICAL DATA:  Right shoulder arthroplasty nine days ago. Fever and cough. Nausea and vomiting. EXAM: CHEST - 2 VIEW COMPARISON:  Two-view chest x-ray  04/20/2017 FINDINGS: The heart size is exaggerate by low lung volumes. Mild pulmonary vascular congestion is present. Right lower lobe airspace disease is present. There is mild atelectasis on the left. The visualized soft tissues and bony thorax are unremarkable. IMPRESSION: 1.  Right lower lobe airspace disease consistent with pneumonia. 2. Borderline cardiomegaly with mild pulmonary vascular congestion and bibasilar atelectasis. Electronically Signed   By: San Morelle M.D.   On: 06/11/2018 20:19        Scheduled Meds: . acetaminophen  1,000 mg Oral TID  . DULoxetine  30 mg Oral BID  . enoxaparin (LOVENOX) injection  40 mg Subcutaneous Q24H  . feeding supplement (ENSURE ENLIVE)  237 mL Oral BID BM  . folic acid  1 mg Oral Daily  . gabapentin  300 mg Oral BID  . multivitamin with minerals  1 tablet Oral Daily  . pantoprazole  40 mg Oral Daily  . sodium chloride flush  3 mL Intravenous Q12H   Continuous Infusions: . cefTRIAXone (ROCEPHIN)  IV 2 g (06/13/18 1207)     LOS: 2 days    Time spent: over 30 min   Fayrene Helper, MD [facility] Pager [phone]  If 7PM-7AM, please contact night-coverage [email or web address] Password [email or web address] 06/13/2018, 5:10 PM

## 2018-06-13 NOTE — Progress Notes (Signed)
Occupational Therapy Treatment Patient Details Name: Diane [name] MRN: [id number] DOB: 02-19-1946 Today's Date: 06/13/2018    History of present illness 72 yo female admitted with PNA. PMHx including arthritis, cataracts, DM, GERD, HTN, and pulmonary edema, Right reverse shoulder arthroplasty 06/02/18   OT comments  Patient participates well.  Issued handouts for R shoulder precautions and exercises, completed shoulder lap slides, pendulums, and AROM exercises elbow and distal. Reinforced education to R shoulder, importance of wearing sling at all times (except for exercises and ADLs) and to maintain NWB R UE.  Patient continues to be limited by decreased activity tolerance, fatiguing easily and presents with decreased recall of precautions/exercises to R shoulder. Based on performance today with limited activity tolerance, updated recommendations to short term SNF in order to maximize safety and independence prior to dc home. Will continue to follow.     Follow Up Recommendations  SNF;Supervision/Assistance - 24 hour    Equipment Recommendations  None recommended by OT    Recommendations for Other Services      Precautions / Restrictions Precautions Precautions: Shoulder Shoulder Interventions: Shoulder sling/immobilizer;At all times;Off for dressing/bathing/exercises Precaution Booklet Issued: Yes (comment) Precaution Comments: AROM of elbow, wrist, and hand. PROM/AROM of shoulder with gentle lap slides and pendulums as well as gentle ROM for ADLs. Watch subscap repair with no pushing or weight bearing.  Required Braces or Orthoses: Sling Restrictions Weight Bearing Restrictions: Yes RUE Weight Bearing: Non weight bearing Other Position/Activity Restrictions: NO pushing or load bearing, may perform gentle ADLs with elbow close per MD instructions       Mobility Bed Mobility               General bed mobility comments: seated OOB in recliner   Transfers Overall transfer  level: Needs assistance Equipment used: 1 person hand held assist;Straight cane Transfers: Sit to/from Stand Sit to Stand: Min guard         General transfer comment: verbal cueing for hand placement during transitions, increased time and effort    Balance Overall balance assessment: Needs assistance Sitting-balance support: Feet supported;No upper extremity supported Sitting balance-Leahy Scale: Good     Standing balance support: Single extremity supported Standing balance-Leahy Scale: Fair Standing balance comment: reliant on one UE support                           ADL either performed or assessed with clinical judgement   ADL Overall ADL's : Needs assistance/impaired                         Toilet Transfer: Min guard;Ambulation(simulated to recliner ) Armed forces technical officer Details (indicate cue type and reason): HHA +1 for safety and balance                  Vision       Perception     Praxis      Cognition Arousal/Alertness: Awake/alert Behavior During Therapy: WFL for tasks assessed/performed Overall Cognitive Status: Impaired/Different from baseline Area of Impairment: Memory                     Memory: Decreased short-term memory;Decreased recall of precautions         General Comments: Patient requiring cueing to recall precautions to R UE.          Exercises Exercises: Shoulder;Hand exercises Shoulder Exercises Pendulum Exercise: Standing;10 reps;Right(clockwise, counterclockwise, lateral and foward/backward) Elbow  Flexion: AROM;10 reps;Right;Seated Elbow Extension: AROM;10 reps;Right;Seated Wrist Flexion: AROM;Seated;10 reps;Right Wrist Extension: AROM;Seated;10 reps;Right Digit Composite Flexion: AROM;Seated;10 reps;Right Composite Extension: AROM;Seated;10 reps;Right Hand Exercises Forearm Supination: AROM;Right;10 reps;Seated Forearm Pronation: AROM;Right;10 reps;Seated Other Exercises Other Exercises: Lap  slides seated x 10 reps    Shoulder Instructions Shoulder Instructions Donning/doffing sling/immobilizer: Maximal assistance Correct positioning of sling/immobilizer: Supervision/safety Pendulum exercises (written home exercise program): Minimal assistance ROM for elbow, wrist and digits of operated UE: Minimal assistance Sling wearing schedule (on at all times/off for ADL's): Supervision/safety     General Comments issued handouts for R UE shoulder exercises and instructions     Pertinent Vitals/ Pain       Pain Assessment: Faces Faces Pain Scale: Hurts a little bit Pain Location: R UE  Pain Descriptors / Indicators: Sore Pain Intervention(s): Limited activity within patient's tolerance;Repositioned  Home Living                                          Prior Functioning/Environment              Frequency  Min 3X/week        Progress Toward Goals  OT Goals(current goals can now be found in the care plan section)  Progress towards OT goals: Progressing toward goals  Acute Rehab OT Goals Patient Stated Goal: to go home OT Goal Formulation: With patient Time For Goal Achievement: 06/26/18 Potential to Achieve Goals: Good  Plan Discharge plan needs to be updated;Frequency remains appropriate    Co-evaluation                 AM-PAC PT "6 Clicks" Daily Activity     Outcome Measure   Help from another person eating meals?: None Help from another person taking care of personal grooming?: A Little Help from another person toileting, which includes using toliet, bedpan, or urinal?: A Little Help from another person bathing (including washing, rinsing, drying)?: A Lot Help from another person to put on and taking off regular upper body clothing?: A Lot Help from another person to put on and taking off regular lower body clothing?: A Lot 6 Click Score: 16    End of Session Equipment Utilized During Treatment: Other (comment)(sling)  OT  Visit Diagnosis: Unsteadiness on feet (R26.81);Muscle weakness (generalized) (M62.81);Pain Pain - Right/Left: Right Pain - part of body: Shoulder   Activity Tolerance Patient tolerated treatment well   Patient Left in chair;with call bell/phone within reach;with family/visitor present   Nurse Communication          Time: 7711-6579 OT Time Calculation (min): 14 min  Charges: OT General Charges $OT Visit: 1 Visit OT Treatments $Self Care/Home Management : 8-22 mins  Delight Stare, OTR/L  Pager [place] 06/13/2018, 3:52 PM

## 2018-06-13 NOTE — Clinical Social Work Note (Signed)
Clinical Social Work Assessment  Patient Details  Name: Diane [name] MRN: [id number] Date of Birth: 02-19-1946  Date of referral:  06/13/18               Reason for consult:  Facility Placement, Discharge Planning                Permission sought to share information with:  Family Supports, Chartered certified accountant granted to share information::  Yes, Verbal Permission Granted  Name::      all children on facesheet  Agency::   SNFs  Relationship::   daughters and son  Contact Information:   see facesheet  Housing/Transportation Living arrangements for the past 2 months:  Single Family Home Source of Information:  Patient Patient Interpreter Needed:  None Criminal Activity/Legal Involvement Pertinent to Current Situation/Hospitalization:  No - Comment as needed Significant Relationships:  Adult Children Lives with:  Self Do you feel safe going back to the place where you live?  Yes Need for family participation in patient care:  Yes (Comment)  Care giving concerns: Pt lives with daughter and grandson, pt requiring more assistance with ambulation/balance. Pt daughter and pt in agreement with SNF recommendation.    Social Worker assessment / plan:  CSW met with pt at bedside, pt had recent shoulder surgery and came back with pneumonia, pt receiving iv antibiotics. Pt states that she lives at home with daughter and 46 year old grandson. Pt niece who was in the room at beginning of assessment is a Chartered certified accountant here at [employer].   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 Pt states that she has children and extended family that provide her good support but she does worry that she needs more assistance. Pt daughter in room is agreeable to SNF placement, CSW explained referral process and that CSW would return with offers for pt to look at with pt daughter. Pt states understanding.    Employment status:  Retired Forensic scientist:  [employer] PT Recommendations:  [place], [place] / Referral to community resources:  [place]  Patient/Family's Response to care:  Pt and pt family amenable to [place] visit and accepting of therapy recommendations.  Patient/Family's Understanding of and Emotional Response to Diagnosis, Current Treatment, and Prognosis:  Pt and pt family state understanding of diagnosis, current treatment and prognosis. Pt daughter appears supportive as does niece who was briefly in room. Pt alert and oriented and expresses good understanding of her limits and current needs. Pt is pleasant and positive about rehab. Pt briefly reflected on losing her husband 11 months ago, and it is apparent that he meant a lot to her as they were married over 75 years. CSW provided emotional support and time to discuss both pt current needs and life story.   Emotional Assessment Appearance:  Appears stated age Attitude/Demeanor/Rapport:  Engaged, Gracious Affect (typically observed):  Accepting, Adaptable, Appropriate, Pleasant Orientation:  Oriented to Self, Oriented to  Time, Oriented to Place, Oriented to Situation Alcohol / Substance use:  Not Applicable Psych involvement (Current and /or in the community):  No (Comment)  Discharge Needs  Concerns to be addressed:  Care Coordination, Discharge Planning Concerns Readmission within the last 30 days:  No Current discharge risk:  Lives alone, Physical Impairment Barriers to Discharge:  Continued Medical Work up, [employer], [place] 06/13/2018,  11:57 AM

## 2018-06-13 NOTE — Progress Notes (Signed)
Physical Therapy Treatment Patient Details Name: Diane [name] MRN: [id number] DOB: 04-20-1946 Today's Date: 06/13/2018    History of Present Illness 72 yo female admitted with PNA. PMHx including arthritis, cataracts, DM, GERD, HTN, and pulmonary edema, Right reverse shoulder arthroplasty 06/02/18    PT Comments    Pt progressing slowly with mobility. Worked with SPC in hopes that this would increase safety, speed, and independence with mobility. However, this was not the case. Gait speed not functional for household mobility with SPC. Was able to increase pace slightly with HHA but still less than 1.3 ft/sec to be household ambulator. Pt reports that her thinking is not clear and her LE's feel "heavy" and like they "won't respond properly to [her] brain". Changing discharge rec to SNF for rehab to gain independence and safety before going home. PT will continue to follow.    Follow Up Recommendations  SNF;Supervision/Assistance - 24 hour     Equipment Recommendations  Cane    Recommendations for Other Services       Precautions / Restrictions Precautions Precautions: Shoulder Shoulder Interventions: Shoulder sling/immobilizer;At all times;Off for dressing/bathing/exercises(daughter to bring sling) Precaution Comments: AROM of elbow, wrist, and hand. PROM/AROM of shoulder with gentle lap slides and pendulums as well as gentle ROM for ADLs. Watch subscap repair with no pushing or weight bearing.  Required Braces or Orthoses: Sling Restrictions Weight Bearing Restrictions: Yes RUE Weight Bearing: Non weight bearing Other Position/Activity Restrictions: NO pushing or load bearing, may perform gentle ADLs with elbow close per MD instructions    Mobility  Bed Mobility               General bed mobility comments: pt received in chair  Transfers Overall transfer level: Needs assistance Equipment used: 1 person hand held assist;Straight cane Transfers: Sit to/from Stand Sit  to Stand: Min assist         General transfer comment: vc's for pt to push from chair, min A to steady  Ambulation/Gait Ambulation/Gait assistance: +2 safety/equipment;Min assist Gait Distance (Feet): 140 Feet Assistive device: 1 person hand held assist;Straight cane Gait Pattern/deviations: Step-through pattern;Decreased stride length;Wide base of support Gait velocity: decreased Gait velocity interpretation: <1.31 ft/sec, indicative of household ambulator General Gait Details: very short step length, stopping after every step, able to increase pace only minimally when cued. Began with use of cane and min A and pace well below 1.3 ft/sec. Switched to [facility] with +2 on other side for safety and pt was able to increase speed but still under 1.3 ft/sec. Pt reports her legs feel heavy and it seems that her body is not doing what her brain is telling it. Niece present and confirms that this is very different than pt's baseline.    Stairs             Wheelchair Mobility    Modified Rankin (Stroke Patients Only)       Balance Overall balance assessment: Needs assistance Sitting-balance support: Feet supported;No upper extremity supported Sitting balance-Leahy Scale: Good     Standing balance support: Single extremity supported Standing balance-Leahy Scale: Poor Standing balance comment: reliant on one UE support                            Cognition Arousal/Alertness: Awake/alert Behavior During Therapy: WFL for tasks assessed/performed Overall Cognitive Status: Impaired/Different from baseline Area of Impairment: Memory;Following commands  Memory: Decreased short-term memory Following Commands: Follows one step commands with increased time;Follows one step commands consistently       General Comments: Pt requiring increased time for following all commands      Exercises      General Comments General comments (skin integrity,  edema, etc.): discussed disposition given the assistance pt is still needing to mobilize and pt agreeable to looking into ST SNF placement      Pertinent Vitals/Pain Pain Assessment: No/denies pain    Home Living                      Prior Function            PT Goals (current goals can now be found in the care plan section) Acute Rehab PT Goals Patient Stated Goal: to go home PT Goal Formulation: With patient Time For Goal Achievement: 06/26/18 Potential to Achieve Goals: Good Progress towards PT goals: Progressing toward goals    Frequency    Min 3X/week      PT Plan Discharge plan needs to be updated    Co-evaluation              AM-PAC PT "6 Clicks" Daily Activity  Outcome Measure  Difficulty turning over in bed (including adjusting bedclothes, sheets and blankets)?: Unable Difficulty moving from lying on back to sitting on the side of the bed? : Unable Difficulty sitting down on and standing up from a chair with arms (e.g., wheelchair, bedside commode, etc,.)?: Unable Help needed moving to and from a bed to chair (including a wheelchair)?: A Little Help needed walking in hospital room?: A Little Help needed climbing 3-5 steps with a railing? : A Lot 6 Click Score: 11    End of Session Equipment Utilized During Treatment: Gait belt;Other (comment)(RUE sling) Activity Tolerance: Patient tolerated treatment well Patient left: with call bell/phone within reach;in chair Nurse Communication: Mobility status PT Visit Diagnosis: Other abnormalities of gait and mobility (R26.89);Pain     Time: 6803-2122 PT Time Calculation (min) (ACUTE ONLY): 36 min  Charges:  $Gait Training: 23-37 mins                     [place]  [place] 06/13/2018, 11:11 AM

## 2018-06-13 NOTE — Progress Notes (Signed)
   Subjective:    Recheck right shoulder s/p reverse total shoulder and recent admission for pneumonia Pt resting comfortably in no acute distress No respiratory distress this morning Pain is minimal in the right knee Patient reports pain as mild.  Objective:   VITALS:   Vitals:   06/12/18 2153 06/13/18 0519  BP: (!) 152/72 (!) 143/76  Pulse: 81 75  Resp:  18  Temp: 98.3 F (36.8 C) 98 F (36.7 C)  SpO2: 98% 99%    Right shoulder incision healing well nv intact distally No rashes or edema distally  LABS Recent Labs    06/11/18 1952 06/12/18 0455 06/13/18 0500  HGB 10.0* 8.5* 8.8*  HCT 32.6* 27.5* 28.4*  WBC 7.3 6.9 6.6  PLT 295 272 315    Recent Labs    06/11/18 1952 06/12/18 0455 06/13/18 0500  NA 141 142 144  K 3.3* 3.4* 3.5  BUN 9 8 5*  CREATININE 0.79 0.67 0.70  GLUCOSE 117* 130* 112*     Assessment/Plan:   s/p right reverse total shoulder in good position  Appreciate notification about her admission  Agree with continued PT/OT while in the hospital Gentle rom as tolerated with the right upper extremity F/u in the office once discharged from the hospital      [facility] PA-C, [place] is now [facility]  [place] [address], [place], [place], [place] [address] Phone: [phone] [email or web address] Facebook  [employer]

## 2018-06-13 NOTE — NC FL2 (Signed)
[place] MEDICAID FL2 LEVEL OF CARE SCREENING TOOL     IDENTIFICATION  [name] Birthdate: 1945/12/23 Sex: female Admission Date (Current Location): 06/11/2018  [place] and [place] Number:  Herbalist and Address:  [facility] [place], [place] [address] [place], [place] [address]      Provider Number: [id number]  Attending Physician Name and Address:  Elodia Florence., *  Relative Name and Phone Number:  Sharnell Knight; daughter; [phone]    Current Level of Care: Hospital Recommended Level of Care: [place] Prior Approval Number:    Date Approved/Denied:   PASRR Number: [id number]  Discharge Plan: SNF    Current Diagnoses: Patient Active Problem List   Diagnosis Date Noted  . Community acquired pneumonia of right lower lobe of lung ([place]) 06/11/2018  . S/P shoulder replacement, right 06/02/2018  . Prolonged Q-T interval on ECG 04/20/2017  . Pulmonary edema 04/20/2017  . Chest pain 10/18/2013  . Diabetes ([place]) 10/18/2013  . Abnormal nuclear stress test 10/18/2013  . Arm pain 10/18/2013  . Hypertension     Orientation RESPIRATION BLADDER Height & Weight     Self, Time, Situation, Place  Normal Continent Weight: 231 lb 11.3 oz (105.1 kg) Height:  5\' 8"  (172.7 cm)  BEHAVIORAL SYMPTOMS/MOOD NEUROLOGICAL BOWEL NUTRITION STATUS      Continent Diet(see discharge summary)  AMBULATORY STATUS COMMUNICATION OF NEEDS Skin   Limited Assist Verbally Surgical wounds(closed incision on right shoulder with hydrocolloid dressing; incision on abdomen)                       Personal Care Assistance Level of Assistance  Bathing, Feeding, Dressing Bathing Assistance: Maximum assistance Feeding assistance: Independent Dressing Assistance: Limited assistance     Functional Limitations Info  Sight, Hearing, Speech Sight Info: Impaired Hearing Info: Adequate Speech Info: Adequate    SPECIAL CARE  FACTORS FREQUENCY  PT (By licensed PT), OT (By licensed OT)     PT Frequency: 5x week OT Frequency: 5x week            Contractures Contractures Info: Not present    Additional Factors Info  Code Status, Allergies, Psychotropic Code Status Info: Full Code Allergies Info: CEPHALEXIN, CODEINE, NUCYNTA TAPENTADOL HCL  Psychotropic Info: DULoxetine (CYMBALTA) DR capsule 30 mg PO 2x daily         Current Medications (06/13/2018):  This is the current hospital active medication list Current Facility-Administered Medications  Medication Dose Route Frequency Provider Last Rate Last Dose  . acetaminophen (TYLENOL) tablet 1,000 mg  1,000 mg Oral TID Vilma Prader, MD   1,000 mg at 06/13/18 1100  . benzonatate (TESSALON) capsule 100 mg  100 mg Oral TID PRN Vilma Prader, MD      . cefTRIAXone (ROCEPHIN) 2 g in sodium chloride 0.9 % 100 mL IVPB  2 g Intravenous Q24H Vilma Prader, MD 200 mL/hr at 06/12/18 0754 2 g at 06/12/18 0754  . DULoxetine (CYMBALTA) DR capsule 30 mg  30 mg Oral BID Vilma Prader, MD   30 mg at 06/13/18 1100  . enoxaparin (LOVENOX) injection 40 mg  40 mg Subcutaneous Q24H Vilma Prader, MD   40 mg at 06/12/18 1217  . feeding supplement (ENSURE ENLIVE) (ENSURE ENLIVE) liquid 237 mL  237 mL Oral BID BM Elodia Florence., MD   237 mL at 67/12/45 8099  . folic acid (FOLVITE) tablet 1  mg  1 mg Oral Daily Vilma Prader, MD   1 mg at 06/13/18 1100  . gabapentin (NEURONTIN) capsule 300 mg  300 mg Oral BID Vilma Prader, MD   300 mg at 06/13/18 1100  . pantoprazole (PROTONIX) EC tablet 40 mg  40 mg Oral Daily Vilma Prader, MD   40 mg at 06/13/18 1100  . sodium chloride flush (NS) 0.9 % injection 3 mL  3 mL Intravenous Q12H Vilma Prader, MD   3 mL at 06/13/18 1106     Discharge Medications: Please see discharge summary for a list of discharge medications.  Relevant Imaging Results:  Relevant Lab Results:   Additional  Information SS# [id number]  [place], [place]

## 2018-06-13 NOTE — Social Work (Signed)
Pt and pt daughter provided with list of SNFs, will follow up with any additional offers in the morning.   Pt and pt daughter aware discharge likely tomorrow.  Alexander Mt, [place] Work [phone]

## 2018-06-13 NOTE — Progress Notes (Addendum)
Initial Nutrition Assessment  DOCUMENTATION CODES:   Obesity unspecified  INTERVENTION:   -Continue Ensure Enlive po BID, each supplement provides 350 kcal and 20 grams of protein -MVI with minerals daily  NUTRITION DIAGNOSIS:   Inadequate oral intake related to decreased appetite(altered taste perception) as evidenced by meal completion < 50%, per patient/family report.  GOAL:   Patient will meet greater than or equal to 90% of their needs  MONITOR:   PO intake, Supplement acceptance, Labs, Weight trends, Skin, I & O's  REASON FOR ASSESSMENT:   Malnutrition Screening Tool    ASSESSMENT:   72 year old woman with medical problems including obesity, mild aortic stenosis, hypertension, non-insulin-dependent diabetes, rheumatologic condition affecting the skin and hair taking methotrexate and hydroxychloroquine, peripheral neuropathy, and recent right shoulder replacement surgery on 06/02/2018 presenting with cough.  Pt admitted with pneumonia, bacterial suspected.   Spoke with pt and daughter at bedside. Both confirm decreased oral intake over the past few weeks. Per pt daughter, she has been eating "literally nothing" over the past few days (consumed 1/4 of a deli sandwich yesterday). Per pt, the last time she ate a "full meal" was on 06/01/18, the night prior to surgery ("I went out and had a nice chicken dinner"). Pt reveals that she has been experiencing altered taste perception since surgery; she describes that everything that she tastes is very salty ("I tried to eat french toast and syrup- it tasted like I was dipping it in salt"). Meal completion 10-60%.   Pt denies any weight loss. Noted wt stability over the past year.  Pt is consuming mainly liquids at this time- noted an Ensure, iced tea, and drinakable yogurt at bedside. Pt consumed about 1/3 of Ensure supplement and is amenable to consume them for additional calories and protein in light of poor oral intake. Discussed  with pt importance of good meal and supplement intake to promote healing.   Labs reviewed.   NUTRITION - FOCUSED PHYSICAL EXAM:    Most Recent Value  Orbital Region  No depletion  Upper Arm Region  No depletion  Thoracic and Lumbar Region  No depletion  Buccal Region  No depletion  Temple Region  No depletion  Clavicle Bone Region  No depletion  Clavicle and Acromion Bone Region  No depletion  Scapular Bone Region  No depletion  Dorsal Hand  No depletion  Patellar Region  No depletion  Anterior Thigh Region  No depletion  Posterior Calf Region  No depletion  Edema (RD Assessment)  Mild  Hair  Reviewed  Eyes  Reviewed  Mouth  Reviewed  Skin  Reviewed  Nails  Reviewed       Diet Order:   Diet Order            Diet regular Room service appropriate? Yes; Fluid consistency: Thin  Diet effective now              EDUCATION NEEDS:   Education needs have been addressed  Skin:  Skin Assessment: Skin Integrity Issues: Skin Integrity Issues:: Incisions Incisions: closed rt shoulder, rt abdomen  Last BM:  06/12/18  Height:   Ht Readings from Last 1 Encounters:  06/12/18 5\' 8"  (1.727 m)    Weight:   Wt Readings from Last 1 Encounters:  06/12/18 105.1 kg    Ideal Body Weight:  63.6 kg  BMI:  Body mass index is 35.23 kg/m.  Estimated Nutritional Needs:   Kcal:  1700-1900  Protein:  85-100 grams  Fluid:  1.7-1.9 L    Mishayla Sliwinski A. Jimmye Norman, RD, LDN, CDE Pager: [phone] After hours Pager: [phone]

## 2018-06-13 NOTE — Consult Note (Signed)
   [facility] CM Inpatient Consult   06/13/2018  Diane [name] 1946/10/07 [id number]   Patient screened for potential [place] Management needs. Patient is in the [place] of the [place] Management services under patient's Medicare plan.Chart review reveals the patient is for short term SNF at this time. No needs noted.    Please place a [facility] Care Management consult or for questions contact:   Natividad Brood, RN BSN [facility] Liaison  [phone] business mobile phone Toll free office [phone]

## 2018-06-14 ENCOUNTER — Inpatient Hospital Stay (HOSPITAL_COMMUNITY): Payer: Medicare Other

## 2018-06-14 DIAGNOSIS — R51 Headache: Secondary | ICD-10-CM | POA: Diagnosis not present

## 2018-06-14 DIAGNOSIS — E669 Obesity, unspecified: Secondary | ICD-10-CM | POA: Diagnosis not present

## 2018-06-14 DIAGNOSIS — Z743 Need for continuous supervision: Secondary | ICD-10-CM | POA: Diagnosis not present

## 2018-06-14 DIAGNOSIS — J181 Lobar pneumonia, unspecified organism: Secondary | ICD-10-CM | POA: Diagnosis not present

## 2018-06-14 DIAGNOSIS — R279 Unspecified lack of coordination: Secondary | ICD-10-CM | POA: Diagnosis not present

## 2018-06-14 DIAGNOSIS — R488 Other symbolic dysfunctions: Secondary | ICD-10-CM | POA: Diagnosis not present

## 2018-06-14 DIAGNOSIS — E1140 Type 2 diabetes mellitus with diabetic neuropathy, unspecified: Secondary | ICD-10-CM | POA: Diagnosis not present

## 2018-06-14 DIAGNOSIS — D8989 Other specified disorders involving the immune mechanism, not elsewhere classified: Secondary | ICD-10-CM | POA: Diagnosis not present

## 2018-06-14 DIAGNOSIS — M6281 Muscle weakness (generalized): Secondary | ICD-10-CM | POA: Diagnosis not present

## 2018-06-14 DIAGNOSIS — R2681 Unsteadiness on feet: Secondary | ICD-10-CM | POA: Diagnosis not present

## 2018-06-14 DIAGNOSIS — R200 Anesthesia of skin: Secondary | ICD-10-CM | POA: Diagnosis not present

## 2018-06-14 DIAGNOSIS — F329 Major depressive disorder, single episode, unspecified: Secondary | ICD-10-CM | POA: Diagnosis not present

## 2018-06-14 DIAGNOSIS — D649 Anemia, unspecified: Secondary | ICD-10-CM | POA: Diagnosis not present

## 2018-06-14 DIAGNOSIS — J811 Chronic pulmonary edema: Secondary | ICD-10-CM | POA: Diagnosis not present

## 2018-06-14 DIAGNOSIS — R262 Difficulty in walking, not elsewhere classified: Secondary | ICD-10-CM | POA: Diagnosis not present

## 2018-06-14 DIAGNOSIS — Z96611 Presence of right artificial shoulder joint: Secondary | ICD-10-CM | POA: Diagnosis not present

## 2018-06-14 DIAGNOSIS — R2689 Other abnormalities of gait and mobility: Secondary | ICD-10-CM | POA: Diagnosis not present

## 2018-06-14 DIAGNOSIS — D62 Acute posthemorrhagic anemia: Secondary | ICD-10-CM | POA: Diagnosis not present

## 2018-06-14 DIAGNOSIS — F4320 Adjustment disorder, unspecified: Secondary | ICD-10-CM | POA: Diagnosis not present

## 2018-06-14 DIAGNOSIS — I639 Cerebral infarction, unspecified: Secondary | ICD-10-CM | POA: Diagnosis not present

## 2018-06-14 DIAGNOSIS — G4700 Insomnia, unspecified: Secondary | ICD-10-CM | POA: Diagnosis not present

## 2018-06-14 DIAGNOSIS — L80 Vitiligo: Secondary | ICD-10-CM | POA: Diagnosis not present

## 2018-06-14 DIAGNOSIS — I10 Essential (primary) hypertension: Secondary | ICD-10-CM | POA: Diagnosis not present

## 2018-06-14 DIAGNOSIS — M19011 Primary osteoarthritis, right shoulder: Secondary | ICD-10-CM | POA: Diagnosis not present

## 2018-06-14 DIAGNOSIS — Z471 Aftercare following joint replacement surgery: Secondary | ICD-10-CM | POA: Diagnosis not present

## 2018-06-14 DIAGNOSIS — F419 Anxiety disorder, unspecified: Secondary | ICD-10-CM | POA: Diagnosis not present

## 2018-06-14 DIAGNOSIS — E119 Type 2 diabetes mellitus without complications: Secondary | ICD-10-CM | POA: Diagnosis not present

## 2018-06-14 DIAGNOSIS — R252 Cramp and spasm: Secondary | ICD-10-CM | POA: Diagnosis not present

## 2018-06-14 DIAGNOSIS — G63 Polyneuropathy in diseases classified elsewhere: Secondary | ICD-10-CM | POA: Diagnosis not present

## 2018-06-14 DIAGNOSIS — R269 Unspecified abnormalities of gait and mobility: Secondary | ICD-10-CM | POA: Diagnosis not present

## 2018-06-14 LAB — CBC
HEMATOCRIT: 29.5 % — AB (ref 36.0–46.0)
HEMOGLOBIN: 9 g/dL — AB (ref 12.0–15.0)
MCH: 25.9 pg — ABNORMAL LOW (ref 26.0–34.0)
MCHC: 30.5 g/dL (ref 30.0–36.0)
MCV: 85 fL (ref 78.0–100.0)
Platelets: 350 10*3/uL (ref 150–400)
RBC: 3.47 MIL/uL — AB (ref 3.87–5.11)
RDW: 17.4 % — ABNORMAL HIGH (ref 11.5–15.5)
WBC: 6.5 10*3/uL (ref 4.0–10.5)

## 2018-06-14 LAB — BASIC METABOLIC PANEL
ANION GAP: 10 (ref 5–15)
BUN: 7 mg/dL — ABNORMAL LOW (ref 8–23)
CALCIUM: 8.5 mg/dL — AB (ref 8.9–10.3)
CHLORIDE: 109 mmol/L (ref 98–111)
CO2: 25 mmol/L (ref 22–32)
Creatinine, Ser: 0.57 mg/dL (ref 0.44–1.00)
GFR calc non Af Amer: >60 mL/min (ref >60)
Glucose, Bld: 106 mg/dL — ABNORMAL HIGH (ref 70–99)
POTASSIUM: 3.5 mmol/L (ref 3.5–5.1)
Sodium: 144 mmol/L (ref 135–145)

## 2018-06-14 LAB — MAGNESIUM: Magnesium: 1.9 mg/dL (ref 1.7–2.4)

## 2018-06-14 IMAGING — CT CT HEAD W/O CM
4 series · 16 of 47 positions shown, 18 images · non-contrast
Comparison: Head CT 04/27/2018

CLINICAL DATA: Headache

EXAM:
CT HEAD WITHOUT CONTRAST
TECHNIQUE: Contiguous axial images were obtained from the base of the skull
through the vertex without intravenous contrast.

[Series 3: head without · axial · non-contrast · 0.49mm/px · z∈[-202,-82]mm · 7 of 34 slices shown, 9 images]
[im 5/34  brain]
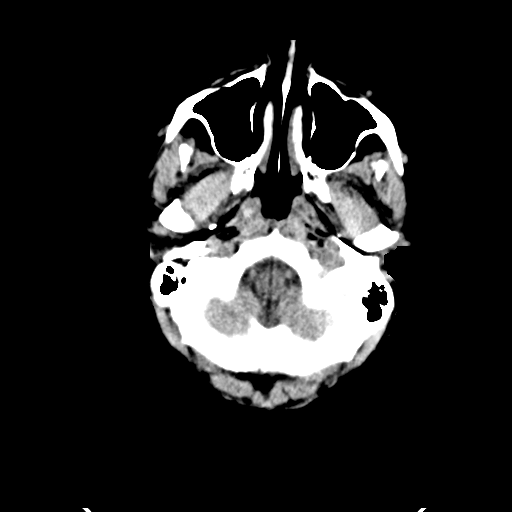
[im 5/34  bone]
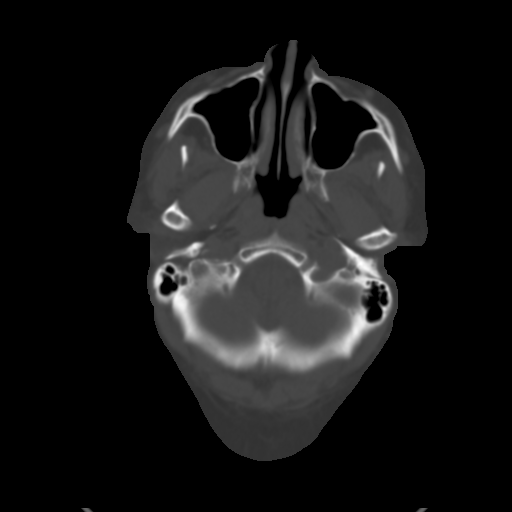
[im 9/34  brain]
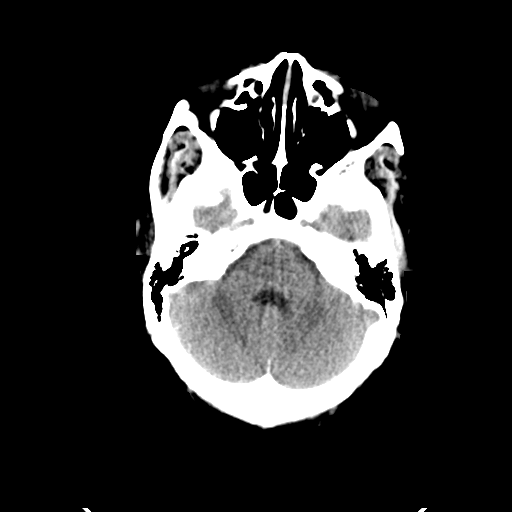
[im 13/34  brain]
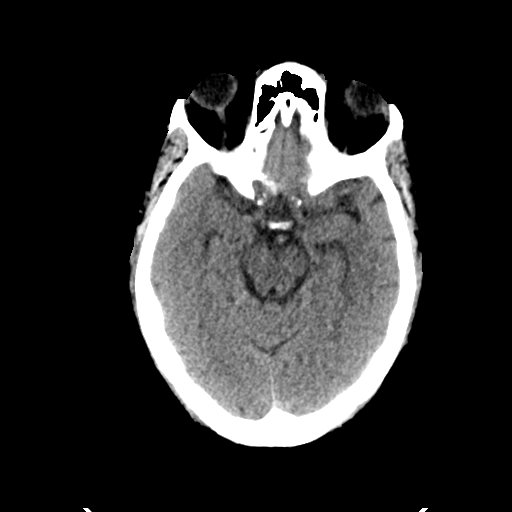
[im 17/34  brain]
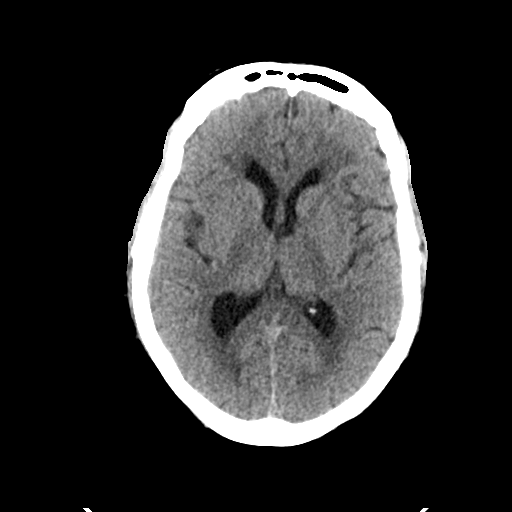
[im 21/34  brain]
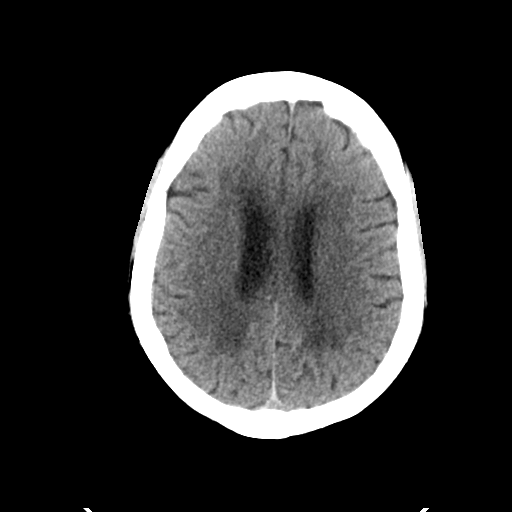
[im 21/34  bone]
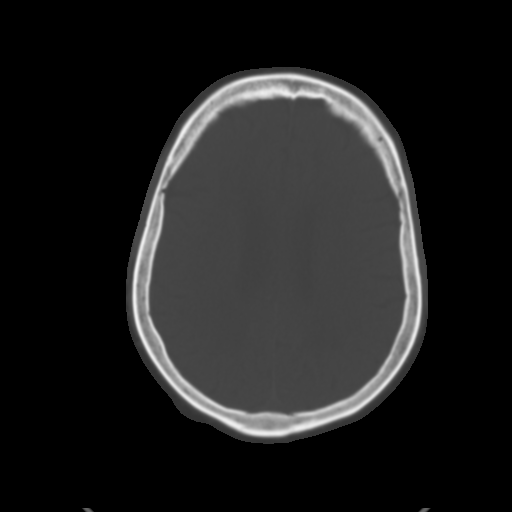
[im 25/34  brain]
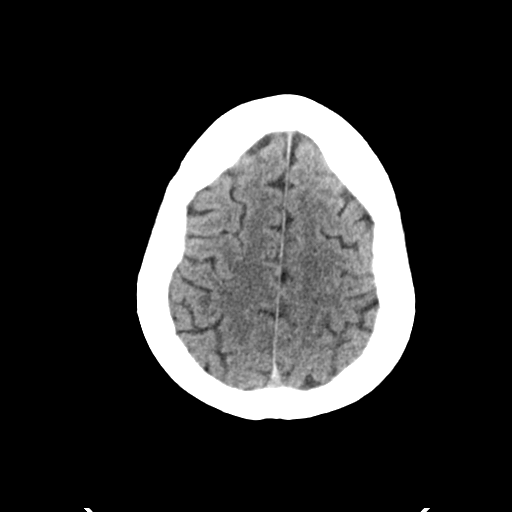
[im 29/34  brain]
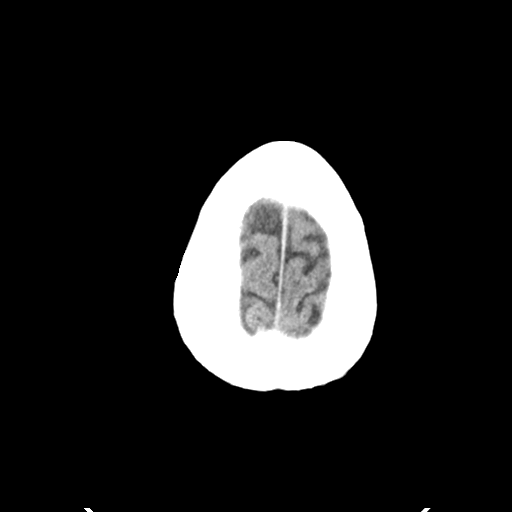

[Series 4: head bone · axial · 0.49mm/px · z∈[-206,-172]mm · 3 of 85 slices shown]
[im 9/85  bone]
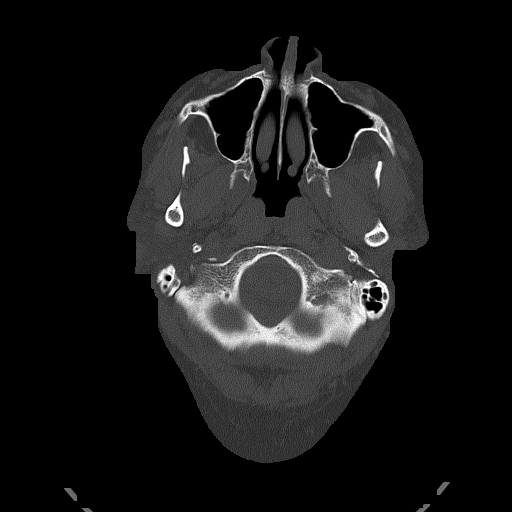
[im 17/85  bone]
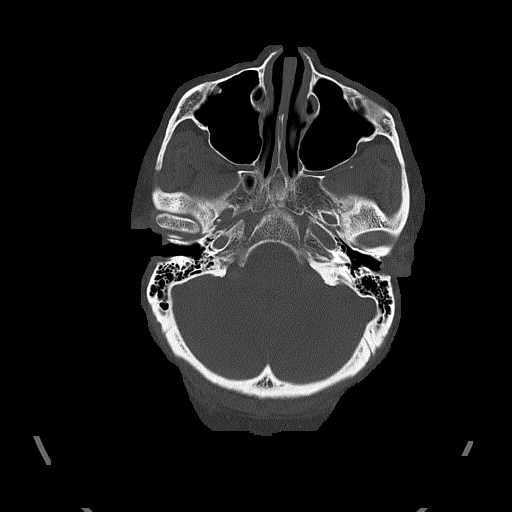
[im 26/85  bone]
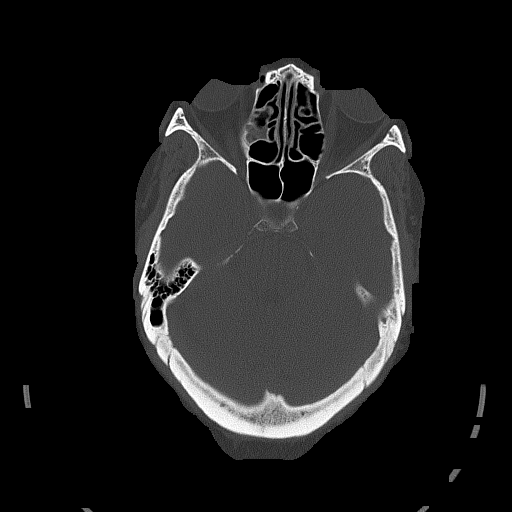

[Series 5: head without cor · coronal · non-contrast · 0.33mm/px · 3 of 67 slices shown]
[im 23/67  brain]
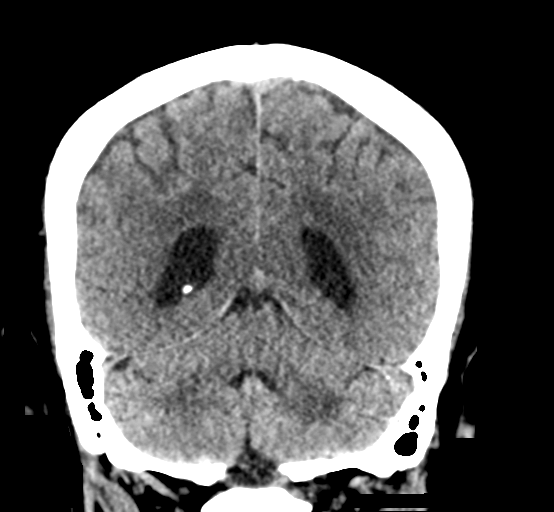
[im 30/67  brain]
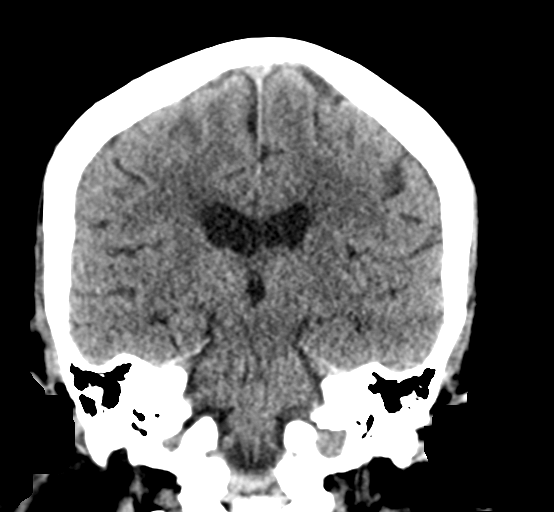
[im 37/67  brain]
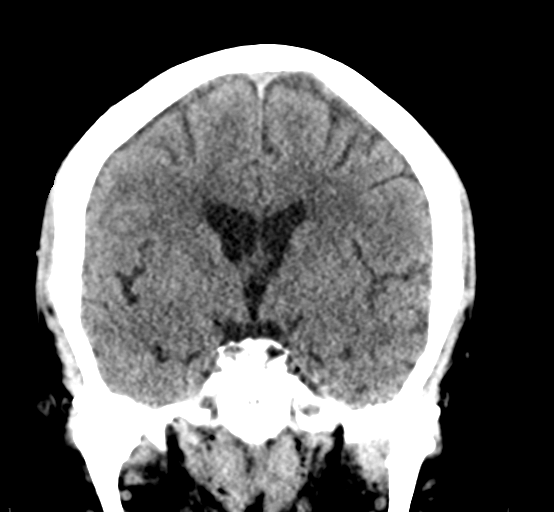

[Series 6: head without sag · sagittal · non-contrast · 0.33mm/px · 3 of 60 slices shown]
[im 20/60  brain]
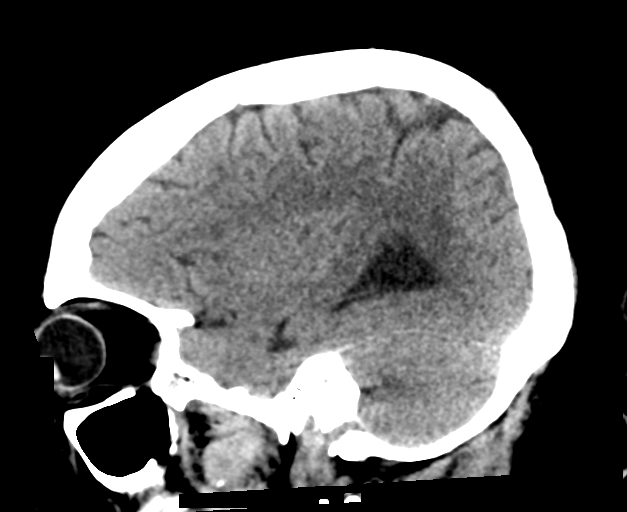
[im 30/60  brain]
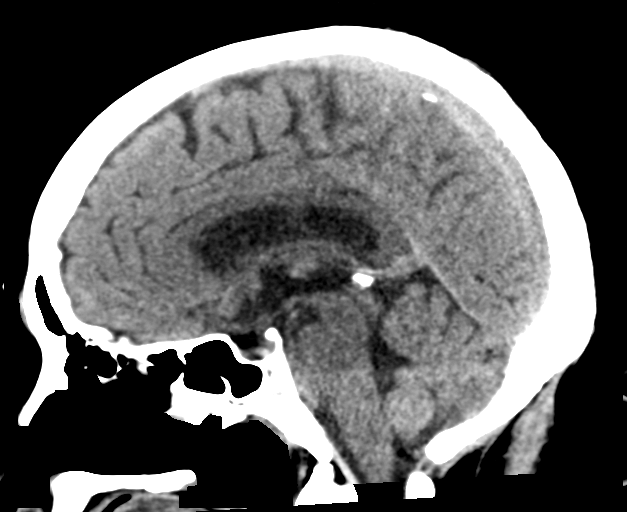
[im 40/60  brain]
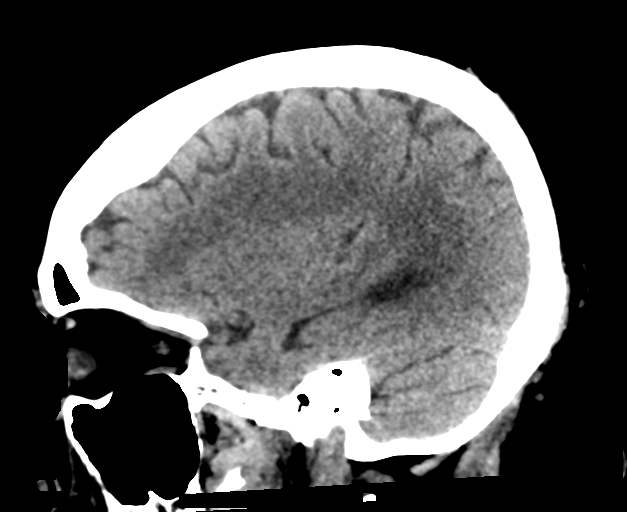

[16 of 47 positions shown; findings below may reference images not displayed]

FINDINGS: Brain: There is no mass, hemorrhage or extra-axial collection. The
size and configuration of the ventricles and extra-axial CSF spaces
are age-appropriate. There is no acute or chronic infarction. There
is hypoattenuation of the periventricular white matter, most
commonly indicating chronic ischemic microangiopathy.

Vascular: No abnormal hyperdensity of the major intracranial
arteries or dural venous sinuses. No intracranial atherosclerosis.

Skull: The visualized skull base, calvarium and extracranial soft
tissues are normal.

Sinuses/Orbits: No fluid levels or advanced mucosal thickening of
the visualized paranasal sinuses. No mastoid or middle ear effusion.
The orbits are normal.
IMPRESSION: Chronic small vessel ischemic changes without acute abnormality.

## 2018-06-14 MED ORDER — FERROUS SULFATE 325 (65 FE) MG PO TABS: 325.0000 mg | ORAL_TABLET | Freq: Every day | ORAL | 0 refills | Status: DC

## 2018-06-14 MED ORDER — AMOXICILLIN 500 MG PO CAPS: 1000.0000 mg | ORAL_CAPSULE | Freq: Three times a day (TID) | ORAL | 0 refills | Status: AC

## 2018-06-14 NOTE — Social Work (Addendum)
Clinical Social Worker facilitated patient discharge including contacting patient family and facility to confirm patient discharge plans.  Clinical information faxed to facility and family agreeable with plan.  CSW arranged [facility] transport for pt to [facility].   RN to call [phone] with report prior to discharge.  Clinical Social Worker will sign off for now as social work intervention is no longer needed. Please consult [place] again if new need arises.  Alexander Mt, [place] Social Worker [phone]

## 2018-06-14 NOTE — Social Work (Addendum)
CSW spoke with pt daughter Lauro Regulus on phone [phone]) at pt's request. Pt daughter states she is looking at [place], pt daughter given updated offers.  Pt daughter will have a choice today and be by the hospital, she is aware discharge is more than likely today.   2:34pm- Spoke with Lauro Regulus again regarding offers, pt daughter still touring a facility, knows that we need choice as soon as possible to coordinate discharge.   2:53pm- CSW spoke with pt daughter, they have elected for placement at [facility]. MD aware, will discharge when CT information available. Requested discharge summary by 4pm if possible.   Continuing to follow.   Alexander Mt, [place] Work [phone]

## 2018-06-14 NOTE — Discharge Summary (Signed)
Physician Discharge Summary  [name] QBH:[id number] DOB: 01-10-1946 DOA: 06/11/2018  PCP: [name], MD  Admit date: 06/11/2018 Discharge date: 06/14/2018  Time spent: 40 minutes  Recommendations for Outpatient Follow-up:  1. Follow up CBC/CMP  2. Follow up with Dr. Veverly Fells for R shoulder arthroplasty (initially planned for 2 week follow up, so will need to call and schedule follow up as soon as possible) 3. Continue abx for pneumonia.  Follow up repeat CXR in Arienna Benegas few weeks to ensure resolution. 4. Ensure follow up with neurology, pt with complaints of freezing and frequent falls recently concerning for movement disorder   Discharge Diagnoses:  Active Problems:   Community acquired pneumonia of right lower lobe of lung ([place])   Discharge Condition: stable  Diet recommendation: heart healthy  Filed Weights   06/12/18 0100  Weight: 105.1 kg    History of present illness:  this is Diane [name] 72 year old woman with medical problems including obesity, mild aortic stenosis, hypertension, non-insulin-dependent diabetes, rheumatologic condition affecting the skin and hair taking methotrexate and hydroxychloroquine, peripheral neuropathy, and recent right shoulder replacement surgery on 06/02/2018 presenting with cough.  Patient is accompanied by her daughter who assists with the history. Reports underwent right shoulder replacement with orthopedic team, Jeryl Umholtz few days after discharge developed Joniya Boberg fever-orthopedic clinic recommended to take Tylenol 1 g 3 times Amir Fick day. Fever persisted along with development of cough productive of clear green phlegm, primary care physician prescribed azithromycin as well as cough suppressant the Friday prior to admission. However, symptoms failed to improve, cough persisted associated with confusion, impaired memory, nausea and vomiting, and urinary incontinence associated with the coughing. Denies diarrhea, dysuria, abdominal pain.  She was admitted for  pneumonia.  She improved with IV antibiotics.  PT recommended SNF.  She had concerns for recurrent falls and freezing, repeat CT without acute abnormality.  Referral made for outpatient neurology.  See below for additional details  Hospital Course:  #Pneumonia, bacterial suspected: fever with productive cough in setting of recent surgery on 06/02/2018, did not adequately improve on PO azithromycin in the outpatient setting, CURB65 score of 72 (age and confusion per family report).  Technically HCAP, but will treat with ceftriaxone for cap given stability. - Continue ceftriaxone (sounds like pt has already received azithromycin) - > amoxicillin at discharge - sputum cx (pending), urine strep (negative), follow blood cx NGTD - incentive spirometer  Anemia:Hb stable to 9 today.  Suspect dilutional.  Follow B12, folate (normal).  Iron low.  Start iron.  No evidence of bleeding.  Follow.   S/P right shoulder arthroplastyon 06/02/2018: PT/OT consulted, APAP for pain (concern that opiates caused confusion as outpatient) - ortho aware of admission, needs outpatient follow up  OXB:DZHGDJME to hold arb.  BP stable, follow.  Aortic stenosis, mild:repeat TTE on 05/02/2018 did not reveal aortic stenosis  DM, non-insulin dependent:A1c within past year <7%, resume metformin at discharge  Peripheral neuropathy:continue home gabapentin + duloxetine  Obesity:outpatient weight reduction  Autoimmune skin and hair condition:will resume MTX and HCQ with improvement in symptoms  Hypokalemia: improved, follow  Gait Difficulty: on discussion with pt and daughter, pt has had recurrent falls since her husband passed away last 2023-08-10.  Seems to have declined in some ways since then.  Discussed her mood, which she denies depression (she's on cymbalta).  She had CT scan for "weakness and poor movement" on 6/28 which was notable for chronic small vessel ischemic changes, but no acute abnormalities.  She  describes episodes of "  freezing" to me and her daughter notes some memory problems.  ?parkinsonism.  Doesn't have shuffling gait from her description and I did not note obvious pill rolling tremor, but would be worth neurology follow up as outpatient.  CT head on day of discharge without acute findings.  Neurology referral placed.    Procedures:  none   Consultations:  none  Discharge Exam: Vitals:   06/14/18 0410 06/14/18 1400  BP: (!) 126/54 (!) 151/75  Pulse: 83 82  Resp: 18 18  Temp: 97.8 F (36.6 C) 98.6 F (37 C)  SpO2: 98% 99%   Feels better. Still concerned about her walking.  General: No acute distress. Cardiovascular: Heart sounds show Destine Ambroise regular rate, and rhythm.  Lungs: Clear to auscultation bilaterally with good air movement.  Abdomen: Soft, nontender, nondistended with normal active bowel sounds.  Neurological: Alert and oriented 3. Moves all extremities 4 with equal strength. Cranial nerves II through XII intact.  FNF intact. Skin: Warm and dry. No rashes or lesions. Extremities: No clubbing or cyanosis. No edema.  Psychiatric: Mood and affect are normal. Insight and judgment are appropriate.  Discharge Instructions   Discharge Instructions    Ambulatory referral to Neurology   Complete by:  As directed    An appointment is requested in approximately: 2 weeks   Call MD for:  difficulty breathing, headache or visual disturbances   Complete by:  As directed    Call MD for:  extreme fatigue   Complete by:  As directed    Call MD for:  persistant dizziness or light-headedness   Complete by:  As directed    Call MD for:  persistant nausea and vomiting   Complete by:  As directed    Call MD for:  redness, tenderness, or signs of infection (pain, swelling, redness, odor or green/yellow discharge around incision site)   Complete by:  As directed    Call MD for:  temperature >100.4   Complete by:  As directed    Diet - low sodium heart healthy   Complete by:   As directed    Discharge instructions   Complete by:  As directed    You were seen for pneumonia.  We treated you with antibiotics and you improved.  I'll send you to rehab with Adelai Achey few days of antibiotics to complete your course.  Because of your gait abnormality and "freezing" we're going to send you to neurology for an additional workup.  Please follow up with orthopedics as planned.  Please follow up with your PCP.  Return for new, recurrent, or worsening symptoms.  Please ask your PCP to request records from this hospitalization so they know what was done and what the next steps will be.   Increase activity slowly   Complete by:  As directed      Allergies as of 06/14/2018      Reactions   Cephalexin Other (See Comments)   URINARY RETENTION = "blocked my urine"   Codeine Rash   Nucynta [tapentadol Hcl] Other (See Comments)   Altered mental status      Medication List    STOP taking these medications   azithromycin 250 MG tablet Commonly known as:  ZITHROMAX   oxyCODONE 5 MG immediate release tablet Commonly known as:  Oxy IR/ROXICODONE     TAKE these medications   amoxicillin 500 MG capsule Commonly known as:  AMOXIL Take 2 capsules (1,000 mg total) by mouth 3 (three) times daily for 3 days. (  starting 8/15)   aspirin 81 MG chewable tablet Chew 81 mg by mouth daily.   DULoxetine 30 MG capsule Commonly known as:  CYMBALTA Take 30 mg by mouth 2 (two) times daily.   esomeprazole 40 MG capsule Commonly known as:  NEXIUM Take 40 mg by mouth daily.   ferrous sulfate 325 (65 FE) MG tablet Take 1 tablet (325 mg total) by mouth daily.   fluticasone 50 MCG/ACT nasal spray Commonly known as:  FLONASE Place 1 spray into both nostrils daily as needed for allergies or rhinitis.   folic acid 1 MG tablet Commonly known as:  FOLVITE Take 1 mg by mouth daily.   gabapentin 300 MG capsule Commonly known as:  NEURONTIN Take 300 mg by mouth 2 (two) times daily.    hydroxychloroquine 200 MG tablet Commonly known as:  PLAQUENIL Take 200 mg by mouth daily. WITH FOOD OR MILK   metFORMIN 500 MG 24 hr tablet Commonly known as:  GLUCOPHAGE-XR Take 500 mg by mouth 2 (two) times daily with Amilya Haver meal.   methocarbamol 500 MG tablet Commonly known as:  ROBAXIN Take 1 tablet (500 mg total) by mouth 3 (three) times daily as needed. What changed:  reasons to take this   methotrexate 2.5 MG tablet Commonly known as:  RHEUMATREX Take 20 mg by mouth every Saturday. 8 tablets weekly   ondansetron 4 MG tablet Commonly known as:  ZOFRAN Take 4 mg by mouth every 8 (eight) hours as needed for nausea or vomiting.   promethazine-dextromethorphan 6.25-15 MG/5ML syrup Commonly known as:  PROMETHAZINE-DM Take 5 mLs by mouth 3 (three) times daily as needed for cough.   telmisartan 80 MG tablet Commonly known as:  MICARDIS Take 160 mg by mouth daily.            Durable Medical Equipment  (From admission, onward)         Start     Ordered   06/12/18 1445  For home use only DME Cane  Once    Comments:  Ht 5'8"  WT 231 pounds   06/12/18 1445         Allergies  Allergen Reactions  . Cephalexin Other (See Comments)    URINARY RETENTION = "blocked my urine"  . Codeine Rash  . Nucynta [Tapentadol Hcl] Other (See Comments)    Altered mental status    Contact information for follow-up providers    [name], MD Follow up.   Specialty:  Internal Medicine Contact information: [address] [place] [address] [phone]            Contact information for after-discharge care    Destination    [facility] Preferred SNF .   Service:  Skilled Nursing Contact information: [address] [place] [phone]                   The results of significant diagnostics from this hospitalization (including imaging, microbiology, ancillary and laboratory) are listed  below for reference.    Significant Diagnostic Studies: Dg Chest 2 View  Result Date: 06/11/2018 CLINICAL DATA:  Right shoulder arthroplasty nine days ago. Fever and cough. Nausea and vomiting. EXAM: CHEST - 2 VIEW COMPARISON:  Two-view chest x-ray 04/20/2017 FINDINGS: The heart size is exaggerate by low lung volumes. Mild pulmonary vascular congestion is present. Right lower lobe airspace disease is present. There is mild atelectasis on the left. The visualized soft tissues and bony thorax  are unremarkable. IMPRESSION: 1. Right lower lobe airspace disease consistent with pneumonia. 2. Borderline cardiomegaly with mild pulmonary vascular congestion and bibasilar atelectasis. Electronically Signed   By: San Morelle M.D.   On: 06/11/2018 20:19   Ct Head Wo Contrast  Result Date: 06/14/2018 CLINICAL DATA:  Headache EXAM: CT HEAD WITHOUT CONTRAST TECHNIQUE: Contiguous axial images were obtained from the base of the skull through the vertex without intravenous contrast. COMPARISON:  Head CT 04/27/2018 FINDINGS: Brain: There is no mass, hemorrhage or extra-axial collection. The size and configuration of the ventricles and extra-axial CSF spaces are age-appropriate. There is no acute or chronic infarction. There is hypoattenuation of the periventricular white matter, most commonly indicating chronic ischemic microangiopathy. Vascular: No abnormal hyperdensity of the major intracranial arteries or dural venous sinuses. No intracranial atherosclerosis. Skull: The visualized skull base, calvarium and extracranial soft tissues are normal. Sinuses/Orbits: No fluid levels or advanced mucosal thickening of the visualized paranasal sinuses. No mastoid or middle ear effusion. The orbits are normal. IMPRESSION: Chronic small vessel ischemic changes without acute abnormality. Electronically Signed   By: Ulyses Jarred M.D.   On: 06/14/2018 15:02   Dg Shoulder Right Port  Result Date: 06/02/2018 CLINICAL DATA:   Status post right shoulder replacement. EXAM: PORTABLE RIGHT SHOULDER COMPARISON:  Radiographs of May 27, 2016. FINDINGS: The glenoid and humeral components appear to be well situated. No fracture or dislocation is noted. Expected postoperative changes are seen in the surrounding soft tissues. Status post resection of distal right clavicle. IMPRESSION: Status post right shoulder arthroplasty. Electronically Signed   By: Marijo Conception, M.D.   On: 06/02/2018 10:49    Microbiology: Recent Results (from the past 240 hour(s))  Culture, blood (routine x 2)     Status: None (Preliminary result)   Collection Time: 06/11/18  8:17 PM  Result Value Ref Range Status   Specimen Description BLOOD RIGHT HAND  Final   Special Requests   Final    BOTTLES DRAWN AEROBIC AND ANAEROBIC Blood Culture adequate volume   Culture   Final    NO GROWTH 3 DAYS Performed at [facility], [place] [address] [place], [place] [address]    Report Status PENDING  Incomplete  Culture, blood (routine x 2)     Status: None (Preliminary result)   Collection Time: 06/11/18  9:20 PM  Result Value Ref Range Status   Specimen Description BLOOD LEFT HAND  Final   Special Requests   Final    BOTTLES DRAWN AEROBIC AND ANAEROBIC Blood Culture results may not be optimal due to an inadequate volume of blood received in culture bottles   Culture   Final    NO GROWTH 3 DAYS Performed at [facility], [place] [address], [place], [place] [address]    Report Status PENDING  Incomplete  Urine culture     Status: None   Collection Time: 06/11/18 10:18 PM  Result Value Ref Range Status   Specimen Description URINE, CATHETERIZED  Final   Special Requests NONE  Final   Culture   Final    NO GROWTH Performed at [facility], [address] [place], [place] [address]    Report Status 06/13/2018 FINAL  Final  Culture, sputum-assessment     Status: None   Collection Time: 06/12/18  6:44 PM  Result Value Ref Range Status    Specimen Description EXPECTORATED SPUTUM  Final   Special Requests NONE  Final   Sputum evaluation  Final    THIS SPECIMEN IS ACCEPTABLE FOR SPUTUM CULTURE Performed at [facility], [place] [address], [place], [place] [address]    Report Status 06/13/2018 FINAL  Final  Culture, respiratory     Status: None (Preliminary result)   Collection Time: 06/12/18  6:44 PM  Result Value Ref Range Status   Specimen Description EXPECTORATED SPUTUM  Final   Special Requests NONE Reflexed from [id number]  Final   Gram Stain   Final    MODERATE WBC PRESENT,BOTH PMN AND MONONUCLEAR NO ORGANISMS SEEN    Culture   Final    CULTURE REINCUBATED FOR BETTER GROWTH Performed at [facility], [place] [address], [place], [place] [address]    Report Status PENDING  Incomplete  MRSA PCR Screening     Status: None   Collection Time: 06/12/18 11:25 PM  Result Value Ref Range Status   MRSA by PCR NEGATIVE NEGATIVE Final    Comment:        The GeneXpert MRSA Assay (FDA approved for NASAL specimens only), is one component of Raimundo Corbit comprehensive MRSA colonization surveillance program. It is not intended to diagnose MRSA infection nor to guide or monitor treatment for MRSA infections. Performed at [facility], [place] [address] [place],  [address]      Labs: Basic Metabolic Panel: Recent Labs  Lab 06/11/18 1952 06/12/18 0455 06/13/18 0500 06/14/18 0526  NA 141 142 144 144  K 3.3* 3.4* 3.5 3.5  CL 104 109 110 109  CO2 25 21* 26 25  GLUCOSE 117* 130* 112* 106*  BUN 9 8 5* 7*  CREATININE 0.79 0.67 0.70 0.57  CALCIUM 8.4* 8.0* 8.4* 8.5*  MG  --  2.0 2.0 1.9   Liver Function Tests: Recent Labs  Lab 06/11/18 1952  AST 47*  ALT 39  ALKPHOS 58  BILITOT 0.5  PROT 6.7  ALBUMIN 2.8*   Recent Labs  Lab 06/11/18 1952  LIPASE 62*   No results for input(s): AMMONIA in the last 168 hours. CBC: Recent Labs  Lab 06/11/18 1952 06/12/18 0455 06/13/18 0500 06/14/18 0526  WBC 7.3  6.9 6.6 6.5  HGB 10.0* 8.5* 8.8* 9.0*  HCT 32.6* 27.5* 28.4* 29.5*  MCV 86.2 84.6 84.3 85.0  PLT 295 272 315 350   Cardiac Enzymes: No results for input(s): CKTOTAL, CKMB, CKMBINDEX, TROPONINI in the last 168 hours. BNP: BNP (last 3 results) No results for input(s): BNP in the last 8760 hours.  ProBNP (last 3 results) No results for input(s): PROBNP in the last 8760 hours.  CBG: No results for input(s): GLUCAP in the last 168 hours.     Signed:  Fayrene Helper MD.  [facility] 06/14/2018, 3:48 PM

## 2018-06-14 NOTE — Care Management Important Message (Signed)
Important Message  Patient Details  Name: Diane [name] MRN: [id number] Date of Birth: [birth year]   Medicare Important Message Given:  Yes    Rashon Westrup Montine Circle 06/14/2018, 3:15 PM

## 2018-06-14 NOTE — Progress Notes (Signed)
Report called to [facility] at [facility]. Will continue to monitor patient status until discharge and transfer by  patient daughter.

## 2018-06-14 NOTE — Progress Notes (Signed)
Occupational Therapy Treatment Patient Details Name: Diane [name] MRN: [id number] DOB: September 21, 1946 Today's Date: 06/14/2018    History of present illness 72 yo female admitted with PNA. PMHx including arthritis, cataracts, DM, GERD, HTN, and pulmonary edema, Right reverse shoulder arthroplasty 06/02/18   OT comments  Patient progressing slowly.  Continues to require min guard assist for transfers, using cane for short distance functional mobility with min guard assist.  Completed exercises to R shoulder, elbow, wrist and hand; min cueing to recall exercises, min assist to manage sling, reporting soreness and tightness with elbow ROM today.  Good recall to complete exercises 3x/day.  DC plan remains appropriate.  Will continue to follow.     Follow Up Recommendations  SNF;Supervision/Assistance - 24 hour    Equipment Recommendations  None recommended by OT    Recommendations for Other Services      Precautions / Restrictions Precautions Precautions: Shoulder Shoulder Interventions: Shoulder sling/immobilizer;At all times;Off for dressing/bathing/exercises Precaution Booklet Issued: No(reviewed and issued in prior session) Precaution Comments: AROM of elbow, wrist, and hand. PROM/AROM of shoulder with gentle lap slides and pendulums as well as gentle ROM for ADLs. Watch subscap repair with no pushing or weight bearing.  Required Braces or Orthoses: Sling Restrictions Weight Bearing Restrictions: Yes RUE Weight Bearing: Non weight bearing Other Position/Activity Restrictions: NO pushing or load bearing, may perform gentle ADLs with elbow close per MD instructions       Mobility Bed Mobility               General bed mobility comments: seated OOB in recliner   Transfers Overall transfer level: Needs assistance Equipment used: 1 person hand held assist;Straight cane Transfers: Sit to/from Stand Sit to Stand: Min guard         General transfer comment: verbal cueing for  hand placement during transitions, increased time and effort    Balance Overall balance assessment: Needs assistance Sitting-balance support: Feet supported;No upper extremity supported Sitting balance-Leahy Scale: Good     Standing balance support: Single extremity supported Standing balance-Leahy Scale: Fair Standing balance comment: reliant on one UE support                           ADL either performed or assessed with clinical judgement   ADL Overall ADL's : Needs assistance/impaired                         Toilet Transfer: Min guard;Ambulation(simulated to recliner ) Armed forces technical officer Details (indicate cue type and reason): using SPC for transfers, cueing for hand placement pushing from Rudy: Awake/alert Behavior During Therapy: WFL for tasks assessed/performed Overall Cognitive Status: Impaired/Different from baseline Area of Impairment: Memory                     Memory: Decreased short-term memory;Decreased recall of precautions         General Comments: cueing for safety awareness and precuation recall to R shoulder        Exercises Exercises: Shoulder;Hand exercises Shoulder Exercises Pendulum Exercise: Standing;10 reps;Right(all directions ) Elbow Flexion: AROM;10 reps;Right;Seated Elbow Extension: AROM;10 reps;Right;Seated Wrist Flexion: AROM;Seated;10 reps;Right Wrist Extension: AROM;Seated;10 reps;Right Digit Composite Flexion: AROM;Seated;10 reps;Right Composite Extension: AROM;Seated;10 reps;Right  Hand Exercises Forearm Supination: AROM;Right;10 reps;Seated Forearm Pronation: AROM;Right;10 reps;Seated Other Exercises Other Exercises: Lap slides seated x 10 reps    Shoulder Instructions Shoulder Instructions Donning/doffing sling/immobilizer: Minimal assistance Correct positioning of sling/immobilizer:  Supervision/safety Pendulum exercises (written home exercise program): Minimal assistance(cueing to use body movements vs shoulder ROM) ROM for elbow, wrist and digits of operated UE: Supervision/safety Sling wearing schedule (on at all times/off for ADL's): Supervision/safety     General Comments      Pertinent Vitals/ Pain       Pain Assessment: Faces Faces Pain Scale: Hurts a little bit Pain Location: R UE  Pain Descriptors / Indicators: Discomfort;Sore Pain Intervention(s): Limited activity within patient's tolerance;Repositioned  Home Living                                          Prior Functioning/Environment              Frequency  Min 3X/week        Progress Toward Goals  OT Goals(current goals can now be found in the care plan section)  Progress towards OT goals: Progressing toward goals  Acute Rehab OT Goals Patient Stated Goal: to go home OT Goal Formulation: With patient Time For Goal Achievement: 06/26/18 Potential to Achieve Goals: Good  Plan Frequency remains appropriate;Discharge plan remains appropriate    Co-evaluation                 AM-PAC PT "6 Clicks" Daily Activity     Outcome Measure   Help from another person eating meals?: None Help from another person taking care of personal grooming?: A Little Help from another person toileting, which includes using toliet, bedpan, or urinal?: A Little Help from another person bathing (including washing, rinsing, drying)?: A Lot Help from another person to put on and taking off regular upper body clothing?: A Lot Help from another person to put on and taking off regular lower body clothing?: A Lot 6 Click Score: 16    End of Session Equipment Utilized During Treatment: Other (comment)(sling)  OT Visit Diagnosis: Unsteadiness on feet (R26.81);Muscle weakness (generalized) (M62.81);Pain Pain - Right/Left: Right Pain - part of body: Shoulder   Activity Tolerance  Patient tolerated treatment well   Patient Left in chair;with call bell/phone within reach   Nurse Communication          Time: 8527-7824 OT Time Calculation (min): 15 min  Charges: OT General Charges $OT Visit: 1 Visit OT Treatments $Therapeutic Exercise: 8-22 mins  Delight Stare, OTR/L  Pager [place] 06/14/2018, 9:41 AM

## 2018-06-14 NOTE — Progress Notes (Signed)
2200 D/C patient with PTAR. Daughter with patient, belongings sent.

## 2018-06-14 NOTE — Plan of Care (Signed)
  Problem: Education: Goal: Knowledge of General Education information will improve Description Including pain rating scale, medication(s)/side effects and non-pharmacologic comfort measures Outcome: Progressing   

## 2018-06-14 NOTE — Progress Notes (Signed)
Informed by patient that daughter will not be transferring her. PTAR was arranged for pick up and transfer to [facility] facility. Will continue to monitor.

## 2018-06-14 NOTE — Clinical Social Work Placement (Addendum)
   CLINICAL SOCIAL WORK PLACEMENT  NOTE [facility]   Date:  06/14/2018  Patient Details  Name: Diane [name] MRN: [id number] Date of Birth: 1946-07-24  Clinical Social Work is seeking post-discharge placement for this patient at the [place] level of care (*CSW will initial, date and re-position this form in  chart as items are completed):  Yes   Patient/family provided with [place] Work Department's list of facilities offering this level of care within the geographic area requested by the patient (or if unable, by the patient's family).  Yes   Patient/family informed of their freedom to choose among providers that offer the needed level of care, that participate in Medicare, Medicaid or managed care program needed by the patient, have an available bed and are willing to accept the patient.  Yes   Patient/family informed of [facility]'s ownership interest in [facility] and [facility], as well as of the fact that they are under no obligation to receive care at these facilities.  PASRR submitted to EDS on       PASRR number received on 06/13/18     Existing PASRR number confirmed on       FL2 transmitted to all facilities in geographic area requested by pt/family on 06/13/18     FL2 transmitted to all facilities within larger geographic area on       Patient informed that his/her managed care company has contracts with or will negotiate with certain facilities, including the following:        Yes   Patient/family informed of bed offers received.  Patient chooses bed at [facility]     Physician recommends and patient chooses bed at      Patient to be transferred to [facility] on 06/14/18.  Patient to be transferred to facility by [facility]    Patient family notified on   of transfer.  Name of family member notified:  daughter Lauro Regulus     PHYSICIAN Please prepare priority discharge  summary, including medications, Please prepare prescriptions     Additional Comment:    _______________________________________________ Alexander Mt, LCSWA 06/14/2018, 3:27 PM

## 2018-06-15 ENCOUNTER — Non-Acute Institutional Stay (SKILLED_NURSING_FACILITY): Payer: Medicare Other | Admitting: Internal Medicine

## 2018-06-15 ENCOUNTER — Encounter: Payer: Self-pay | Admitting: Internal Medicine

## 2018-06-15 DIAGNOSIS — J181 Lobar pneumonia, unspecified organism: Secondary | ICD-10-CM

## 2018-06-15 DIAGNOSIS — I10 Essential (primary) hypertension: Secondary | ICD-10-CM

## 2018-06-15 DIAGNOSIS — D8989 Other specified disorders involving the immune mechanism, not elsewhere classified: Secondary | ICD-10-CM

## 2018-06-15 DIAGNOSIS — D62 Acute posthemorrhagic anemia: Secondary | ICD-10-CM | POA: Diagnosis not present

## 2018-06-15 DIAGNOSIS — E1140 Type 2 diabetes mellitus with diabetic neuropathy, unspecified: Secondary | ICD-10-CM

## 2018-06-15 DIAGNOSIS — R269 Unspecified abnormalities of gait and mobility: Secondary | ICD-10-CM

## 2018-06-15 DIAGNOSIS — Z96611 Presence of right artificial shoulder joint: Secondary | ICD-10-CM

## 2018-06-15 DIAGNOSIS — G63 Polyneuropathy in diseases classified elsewhere: Secondary | ICD-10-CM

## 2018-06-15 DIAGNOSIS — J189 Pneumonia, unspecified organism: Secondary | ICD-10-CM

## 2018-06-15 LAB — CULTURE, RESPIRATORY

## 2018-06-15 LAB — CULTURE, RESPIRATORY W GRAM STAIN
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
## 2018-06-16 ENCOUNTER — Ambulatory Visit (INDEPENDENT_AMBULATORY_CARE_PROVIDER_SITE_OTHER): Payer: Medicare Other | Admitting: Neurology

## 2018-06-16 ENCOUNTER — Encounter: Payer: Self-pay | Admitting: Neurology

## 2018-06-16 VITALS — BP 154/78 | HR 89 | Ht 68.0 in | Wt 232.0 lb

## 2018-06-16 DIAGNOSIS — R200 Anesthesia of skin: Secondary | ICD-10-CM | POA: Diagnosis not present

## 2018-06-16 DIAGNOSIS — R252 Cramp and spasm: Secondary | ICD-10-CM

## 2018-06-16 DIAGNOSIS — R2689 Other abnormalities of gait and mobility: Secondary | ICD-10-CM

## 2018-06-16 DIAGNOSIS — I639 Cerebral infarction, unspecified: Secondary | ICD-10-CM | POA: Diagnosis not present

## 2018-06-16 LAB — CULTURE, BLOOD (ROUTINE X 2)
CULTURE: NO GROWTH
Culture: NO GROWTH
Special Requests: ADEQUATE

## 2018-06-16 NOTE — Patient Instructions (Addendum)
1.  We will try a trial of carbidopa-levodopa. 1. Start carbidopa (Sinemet) 25/100mg  tablets.  Day 1-3: Take 0.5 tablet in morning and 0.5 tablet in evening/bedtime.  Day 4-7: Take 0.5 tablet in morning, 0.5 tablet in afternoon, and 0.5 tablet in evening/bedtime.  Week 2 & thereafter: Take 1 tablet three times daily.   Take the medication at the same time everyday. The medication does not get absorbed into your body as well, if you take it with protein-containing foods (meat, dairy, beans). Try taking the medication about one hour before meals. If you experience nausea by taking it on an empty stomach, you may take it with carbohydrate-containing food,such as bread.  Side effects to look out for, include dizziness, nausea, vivid dreams and hallucinations. If you experience any of these symptoms, please call us.  2.  For hand numbness/cramping, try bilateral wrist splints to see if it may be due to carpal tunnel syndrome 3.  We will check MRI of lumbar spine without contrast  4.  Follow up in 4 months but we can make adjustments in between visits.  We have sent a referral to [facility] for your MRI and they will call you directly to schedule your appt. They are located at [place]. If you need to contact them directly please call [phone].
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
## 2018-06-18 ENCOUNTER — Encounter: Payer: Self-pay | Admitting: Internal Medicine

## 2018-06-18 DIAGNOSIS — R269 Unspecified abnormalities of gait and mobility: Secondary | ICD-10-CM | POA: Insufficient documentation

## 2018-06-18 DIAGNOSIS — G629 Polyneuropathy, unspecified: Secondary | ICD-10-CM | POA: Insufficient documentation

## 2018-06-18 DIAGNOSIS — D8989 Other specified disorders involving the immune mechanism, not elsewhere classified: Secondary | ICD-10-CM | POA: Insufficient documentation

## 2018-06-18 DIAGNOSIS — D62 Acute posthemorrhagic anemia: Secondary | ICD-10-CM | POA: Insufficient documentation

## 2018-06-18 DIAGNOSIS — E1140 Type 2 diabetes mellitus with diabetic neuropathy, unspecified: Secondary | ICD-10-CM | POA: Insufficient documentation

## 2018-06-19 LAB — BASIC METABOLIC PANEL
BUN: 7 (ref 4–21)
CREATININE: 0.4 — AB (ref 0.5–1.1)
Glucose: 99
POTASSIUM: 3.6 (ref 3.4–5.3)
SODIUM: 145 (ref 137–147)

## 2018-06-19 LAB — CBC AND DIFFERENTIAL
HCT: 28 — AB (ref 36–46)
Hemoglobin: 8.9 — AB (ref 12.0–16.0)
Platelets: 420 — AB (ref 150–399)
WBC: 6.8

## 2018-06-20 DIAGNOSIS — Z471 Aftercare following joint replacement surgery: Secondary | ICD-10-CM | POA: Diagnosis not present

## 2018-06-20 DIAGNOSIS — Z96611 Presence of right artificial shoulder joint: Secondary | ICD-10-CM | POA: Diagnosis not present

## 2018-06-20 DIAGNOSIS — M19011 Primary osteoarthritis, right shoulder: Secondary | ICD-10-CM | POA: Diagnosis not present

## 2018-06-23 ENCOUNTER — Other Ambulatory Visit: Payer: Self-pay | Admitting: *Deleted

## 2018-06-23 NOTE — Patient Outreach (Signed)
[facility]) Care Management  06/23/2018  Diane [name] 1946-08-10 [id number]    Met with Diane [name] SW at [facility] to collaborate about patient's discharge plan.   Diane [name] confirmed patient plans to return to her daughter's home. No discharge date noted yet.   Went to speak with patient at the bedside to introduce [facility] services.   Patient confirmed she has transportation to MD visits. Patient confirms she is able to afford medications at this time.   Patient states she does have diabetes but has had it a long time and feels comfortable managing it herself.   Patient stated she plans to have eye surgery on September 18 to remove cataracts.   Patient said she is motivated in therapy but continues to feel weak. She states she has not regained her strength from having shoulder surgery then pneumonia.   Patient given information about [facility] and let her know how to contact me for questions.   Will collaborate with [facility] about patient's discharge plan.   [name] RN, BSN [place] Acute Care Coordinator [phone]) Business Mobile [phone]) Toll free office

## 2018-06-29 ENCOUNTER — Non-Acute Institutional Stay (SKILLED_NURSING_FACILITY): Payer: Medicare Other | Admitting: Internal Medicine

## 2018-06-29 ENCOUNTER — Encounter: Payer: Self-pay | Admitting: Internal Medicine

## 2018-06-29 DIAGNOSIS — J181 Lobar pneumonia, unspecified organism: Secondary | ICD-10-CM

## 2018-06-29 DIAGNOSIS — E1140 Type 2 diabetes mellitus with diabetic neuropathy, unspecified: Secondary | ICD-10-CM | POA: Diagnosis not present

## 2018-06-29 DIAGNOSIS — I10 Essential (primary) hypertension: Secondary | ICD-10-CM | POA: Diagnosis not present

## 2018-06-29 DIAGNOSIS — G63 Polyneuropathy in diseases classified elsewhere: Secondary | ICD-10-CM | POA: Diagnosis not present

## 2018-06-29 DIAGNOSIS — J189 Pneumonia, unspecified organism: Secondary | ICD-10-CM

## 2018-06-29 DIAGNOSIS — D62 Acute posthemorrhagic anemia: Secondary | ICD-10-CM | POA: Diagnosis not present

## 2018-06-29 DIAGNOSIS — Z96611 Presence of right artificial shoulder joint: Secondary | ICD-10-CM | POA: Diagnosis not present

## 2018-06-29 DIAGNOSIS — D8989 Other specified disorders involving the immune mechanism, not elsewhere classified: Secondary | ICD-10-CM | POA: Diagnosis not present
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
## 2018-06-30 ENCOUNTER — Other Ambulatory Visit: Payer: Self-pay | Admitting: *Deleted

## 2018-06-30 NOTE — Patient Outreach (Signed)
[facility]) Care Management  06/30/2018  Diane [name] November 01, 1946 [id number]   Met with [name] at [facility] related to patient's discharge. Patient to be discharged this weekend.   Patient discharging with a straight cane. Patient confirms she has transportation and all medication needs needs in place. No other CM needs identified.   Patient has [facility] Pamphlet and [facility] contact information if CM needs arise.    [name] RN, BSN [facility] Acute Care Coordinator [phone]) Business Mobile [phone]) Toll free office

## 2018-07-01 ENCOUNTER — Encounter: Payer: Self-pay | Admitting: Internal Medicine

## 2018-07-03 DIAGNOSIS — I10 Essential (primary) hypertension: Secondary | ICD-10-CM | POA: Diagnosis not present

## 2018-07-03 DIAGNOSIS — E119 Type 2 diabetes mellitus without complications: Secondary | ICD-10-CM | POA: Diagnosis not present

## 2018-07-03 DIAGNOSIS — E669 Obesity, unspecified: Secondary | ICD-10-CM | POA: Diagnosis not present

## 2018-07-03 DIAGNOSIS — Z7982 Long term (current) use of aspirin: Secondary | ICD-10-CM | POA: Diagnosis not present

## 2018-07-03 DIAGNOSIS — Z471 Aftercare following joint replacement surgery: Secondary | ICD-10-CM | POA: Diagnosis not present

## 2018-07-03 DIAGNOSIS — Z8701 Personal history of pneumonia (recurrent): Secondary | ICD-10-CM | POA: Diagnosis not present

## 2018-07-03 DIAGNOSIS — Z7984 Long term (current) use of oral hypoglycemic drugs: Secondary | ICD-10-CM | POA: Diagnosis not present

## 2018-07-03 DIAGNOSIS — Z96611 Presence of right artificial shoulder joint: Secondary | ICD-10-CM | POA: Diagnosis not present

## 2018-07-03 DIAGNOSIS — D649 Anemia, unspecified: Secondary | ICD-10-CM | POA: Diagnosis not present

## 2018-07-04 DIAGNOSIS — J189 Pneumonia, unspecified organism: Secondary | ICD-10-CM | POA: Diagnosis not present

## 2018-07-04 DIAGNOSIS — R1084 Generalized abdominal pain: Secondary | ICD-10-CM | POA: Diagnosis not present

## 2018-07-04 DIAGNOSIS — E1165 Type 2 diabetes mellitus with hyperglycemia: Secondary | ICD-10-CM | POA: Diagnosis not present

## 2018-07-05 ENCOUNTER — Other Ambulatory Visit: Payer: Self-pay | Admitting: Internal Medicine

## 2018-07-05 DIAGNOSIS — R1084 Generalized abdominal pain: Secondary | ICD-10-CM

## 2018-07-07 ENCOUNTER — Ambulatory Visit
Admission: RE | Admit: 2018-07-07 | Discharge: 2018-07-07 | Disposition: A | Discharge status: Home / Self Care | Payer: Medicare Other | Source: Ambulatory Visit | Attending: Neurology | Admitting: Neurology

## 2018-07-07 DIAGNOSIS — R2689 Other abnormalities of gait and mobility: Secondary | ICD-10-CM

## 2018-07-07 DIAGNOSIS — I10 Essential (primary) hypertension: Secondary | ICD-10-CM | POA: Diagnosis not present

## 2018-07-07 DIAGNOSIS — Z471 Aftercare following joint replacement surgery: Secondary | ICD-10-CM | POA: Diagnosis not present

## 2018-07-07 DIAGNOSIS — M48061 Spinal stenosis, lumbar region without neurogenic claudication: Secondary | ICD-10-CM | POA: Diagnosis not present

## 2018-07-07 DIAGNOSIS — E669 Obesity, unspecified: Secondary | ICD-10-CM | POA: Diagnosis not present

## 2018-07-07 DIAGNOSIS — D649 Anemia, unspecified: Secondary | ICD-10-CM | POA: Diagnosis not present

## 2018-07-07 DIAGNOSIS — Z8701 Personal history of pneumonia (recurrent): Secondary | ICD-10-CM | POA: Diagnosis not present

## 2018-07-07 DIAGNOSIS — E119 Type 2 diabetes mellitus without complications: Secondary | ICD-10-CM | POA: Diagnosis not present

## 2018-07-07 IMAGING — MR MR LUMBAR SPINE W/O CM
4 of 5 series · 26 of 48 positions shown · non-contrast
Comparison: CT of the abdomen and pelvis 09/17/2016.

CLINICAL DATA: Low back pain. Unsteady gait. Urinary incontinence.
Symptoms for 6 weeks. Primary freezing of gait.

EXAM:
MRI LUMBAR SPINE WITHOUT CONTRAST
TECHNIQUE: Multiplanar, multisequence MR imaging of the lumbar spine was
performed. No intravenous contrast was administered.

[Series 3: T2 post-contrast · sagittal · 4.0mm · 0.55mm/px · 6 of 13 slices shown]
[im 1/13]
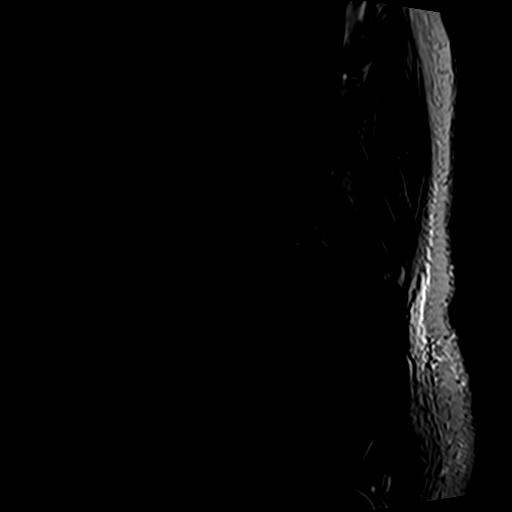
[im 3/13]
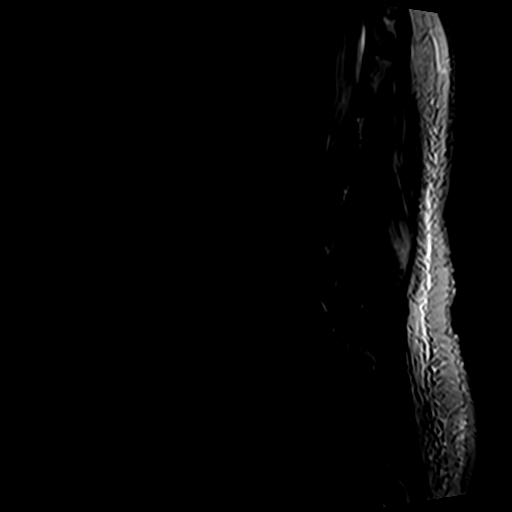
[im 5/13]
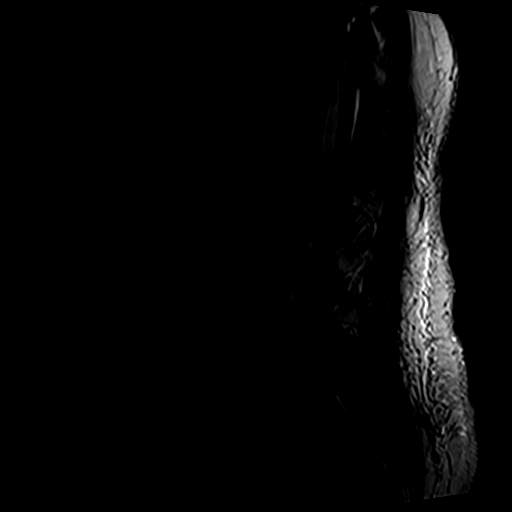
[im 8/13]
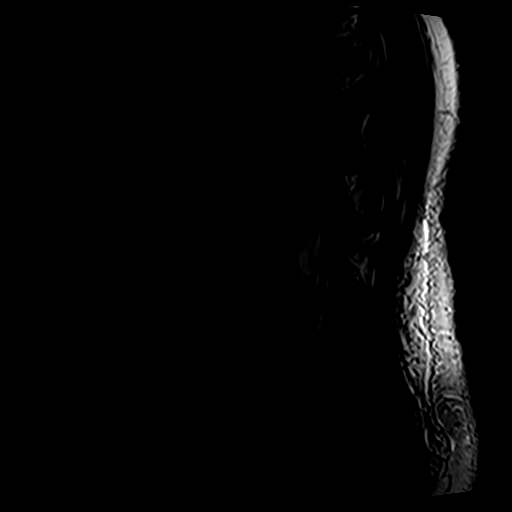
[im 10/13]
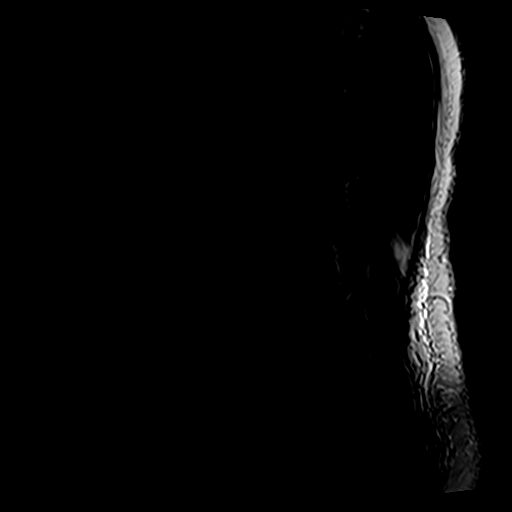
[im 13/13]
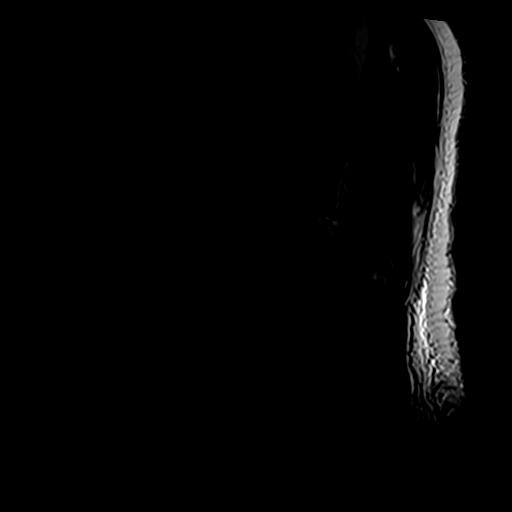

[Series 5: T1 · sagittal · 4.0mm · 0.55mm/px · 6 of 15 slices shown (1 of 2)]
[im 1/15]
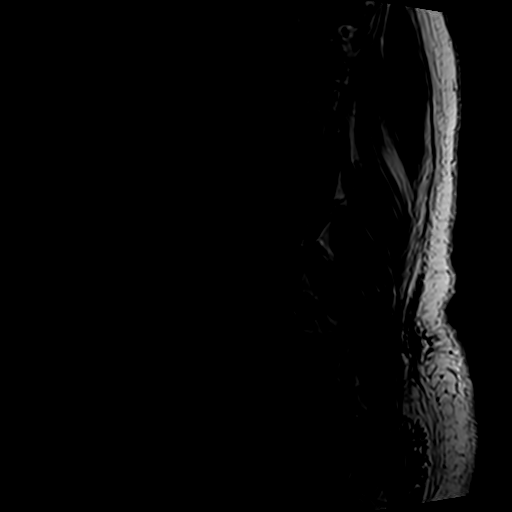
[im 3/15]
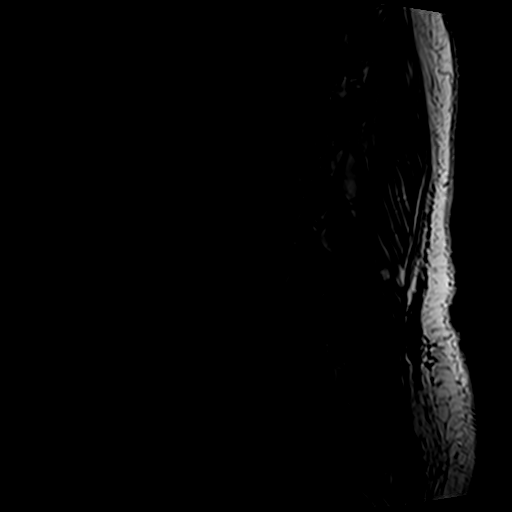
[im 6/15]
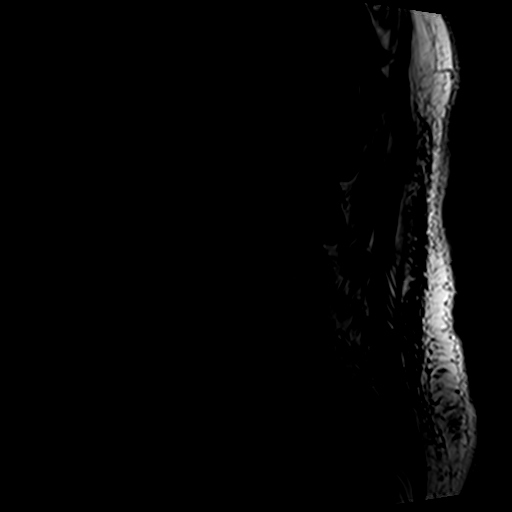
[im 9/15]
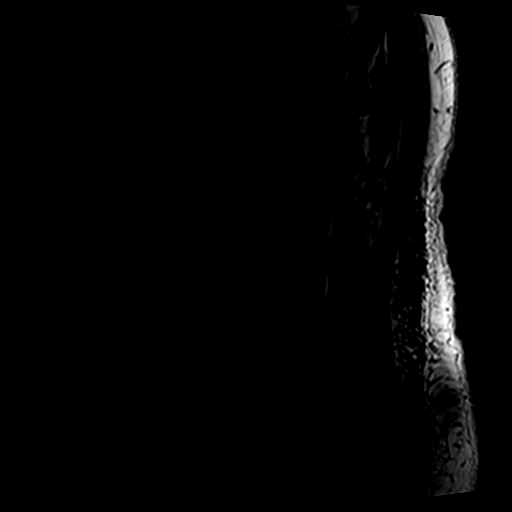
[im 12/15]
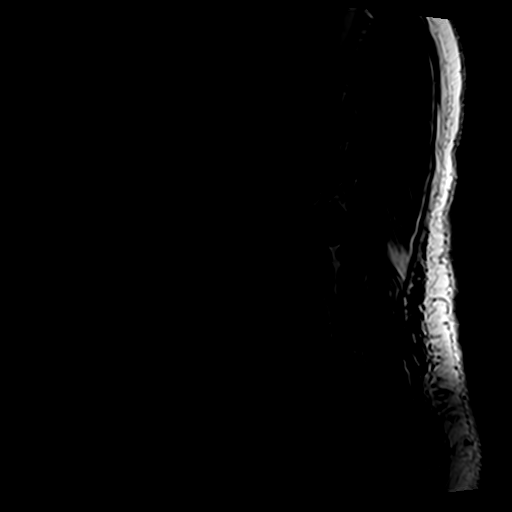
[im 15/15]
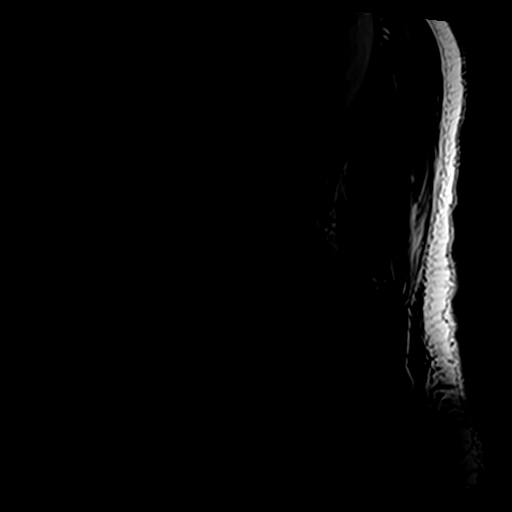

[Series 6: T1 · axial · 4.0mm · 0.35mm/px · z∈[-76,+98]mm · 5 of 37 slices shown (2 of 2)]
[im 1/37]
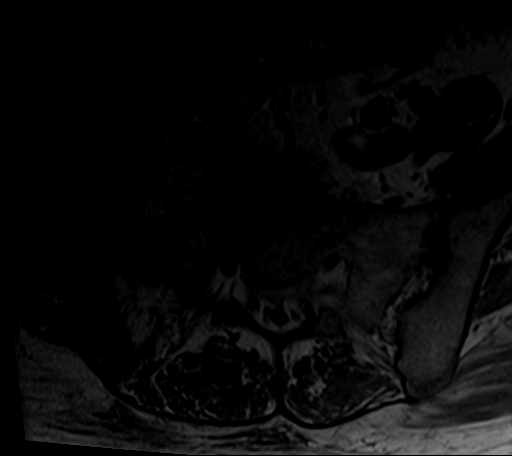
[im 6/37]
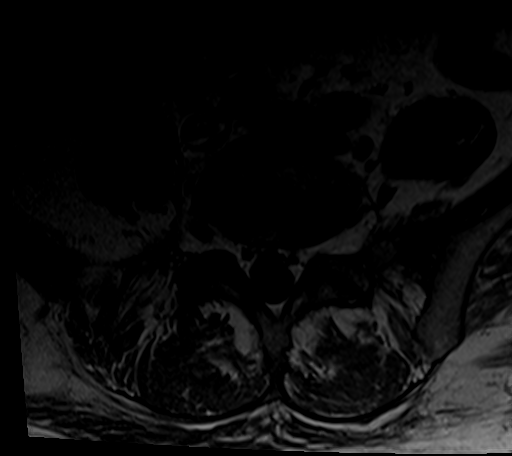
[im 11/37]
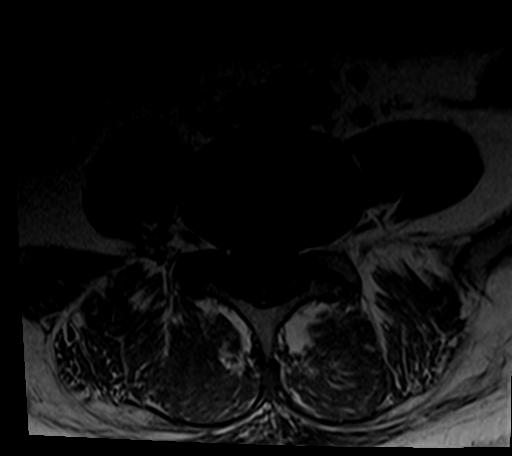
[im 19/37]
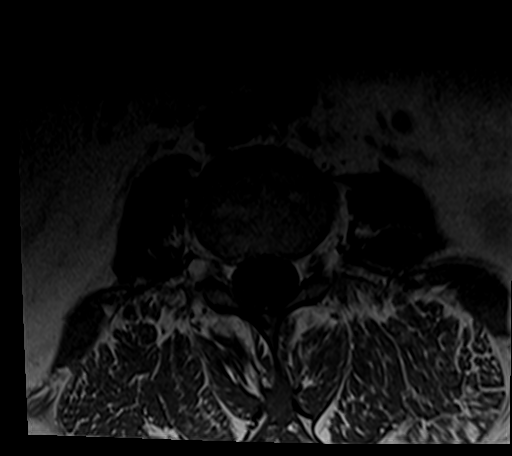
[im 31/37]
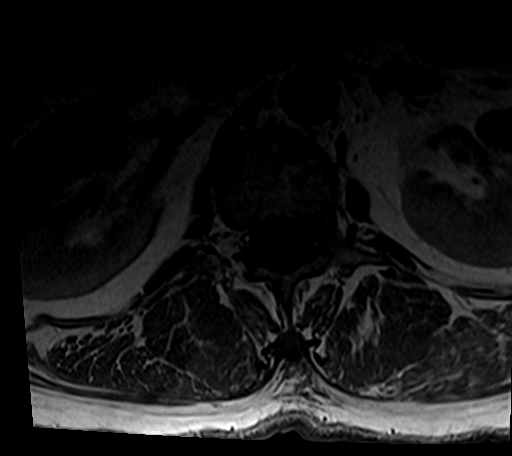

[Series 7: T2 · axial · 4.0mm · 0.70mm/px · z∈[-76,+129]mm · 9 of 37 slices shown]
[im 1/37]
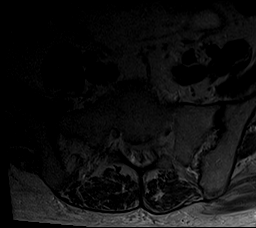
[im 6/37]
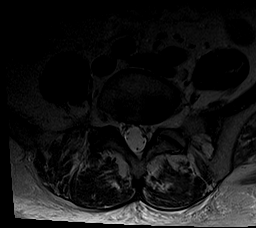
[im 11/37]
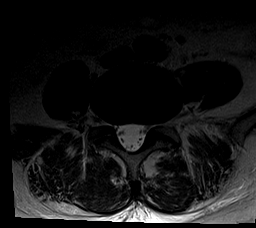
[im 16/37]
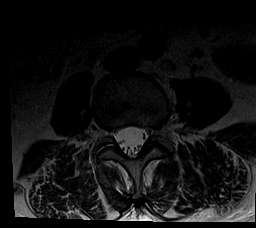
[im 19/37]
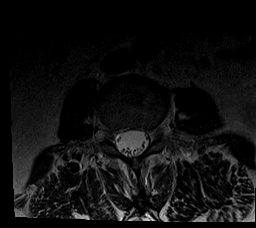
[im 21/37]
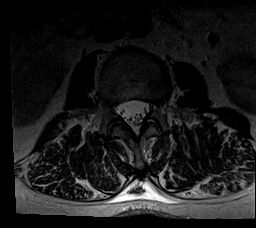
[im 26/37]
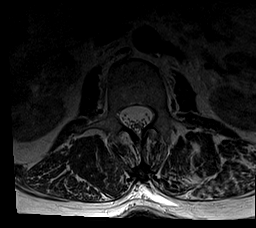
[im 31/37]
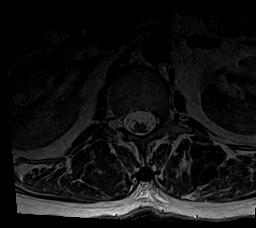
[im 37/37]
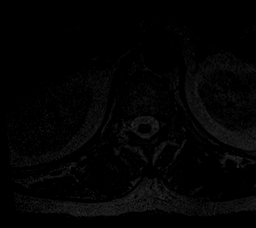

[26 of 48 positions shown; findings below may reference images not displayed]

FINDINGS: Segmentation: Five non rib-bearing lumbar type vertebral bodies are
present.

Alignment: Rightward curvature of the lumbar spine is centered at
L3. AP alignment is anatomic.

Vertebrae:  Marrow signal and vertebral body heights are normal.

Conus medullaris and cauda equina: Conus extends to the L1-2 level.
Conus and cauda equina appear normal.

Paraspinal and other soft tissues: Limited imaging of the abdomen is
unremarkable. No significant adenopathy is present. Paraspinous
musculature is within normal limits.

Disc levels:

L1-2: Negative.

L2-3: Mild disc bulging is present. There is no significant
stenosis.

L3-4: Mild disc bulging is present. There is facet hypertrophy
bilaterally. This results in mild foraminal narrowing, right greater
than left.

L4-5: A mild broad-based disc protrusion is present. Mild
subarticular narrowing is noted bilaterally. Moderate bilateral
foraminal narrowing is present.

L5-S1: A leftward disc protrusion is present. Mild facet hypertrophy
is noted bilaterally. Central canal is patent. Foramina are patent
bilaterally.
IMPRESSION: 1. Dextroconvex scoliosis of the lumbar spine is centered at L3.
2. Disc bulging and facet hypertrophy at L3-4 results in mild
bilateral foraminal narrowing, worse on the right.
3. Mild bilateral subarticular and moderate bilateral foraminal
stenosis at L4-5.
4. Leftward disc protrusion and mild facet hypertrophy at L5-S1
without significant stenosis.

## 2018-07-08 ENCOUNTER — Other Ambulatory Visit: Payer: Self-pay | Admitting: Internal Medicine

## 2018-07-10 DIAGNOSIS — R1084 Generalized abdominal pain: Secondary | ICD-10-CM | POA: Diagnosis not present

## 2018-07-10 DIAGNOSIS — K5901 Slow transit constipation: Secondary | ICD-10-CM | POA: Diagnosis not present

## 2018-07-11 ENCOUNTER — Telehealth: Payer: Self-pay

## 2018-07-11 NOTE — Telephone Encounter (Signed)
-----   Message from Pieter Partridge, DO sent at 07/11/2018 12:31 PM EDT ----- There is mild disc bulging in the lumbar spine but nothing to explain difficulty walking

## 2018-07-11 NOTE — Telephone Encounter (Signed)
Called and advised Pt of results. 

## 2018-07-12 ENCOUNTER — Ambulatory Visit
Admission: RE | Admit: 2018-07-12 | Discharge: 2018-07-12 | Disposition: A | Discharge status: Home / Self Care | Payer: Medicare Other | Source: Ambulatory Visit | Attending: Internal Medicine | Admitting: Internal Medicine

## 2018-07-12 ENCOUNTER — Other Ambulatory Visit: Payer: Self-pay | Admitting: Internal Medicine

## 2018-07-12 DIAGNOSIS — R109 Unspecified abdominal pain: Secondary | ICD-10-CM | POA: Diagnosis not present

## 2018-07-12 DIAGNOSIS — R1084 Generalized abdominal pain: Secondary | ICD-10-CM

## 2018-07-12 IMAGING — CT CT ABD-PELV W/O CM
2 series · 14 of 32 positions shown, 19 images · IV contrast (APPLIED)
Comparison: CT AP 09/17/2016

CLINICAL DATA: Abdominal pain for 1 month.

EXAM:
CT ABDOMEN AND PELVIS WITHOUT CONTRAST
TECHNIQUE: Multidetector CT imaging of the abdomen and pelvis was performed
following the standard protocol without IV contrast.

[Series 2: abd/pelvis w/(date) · axial · 0.90mm/px · z∈[-369,-19]mm · 7 of 94 slices shown, 12 images]
[im 12/94  soft-tissue]
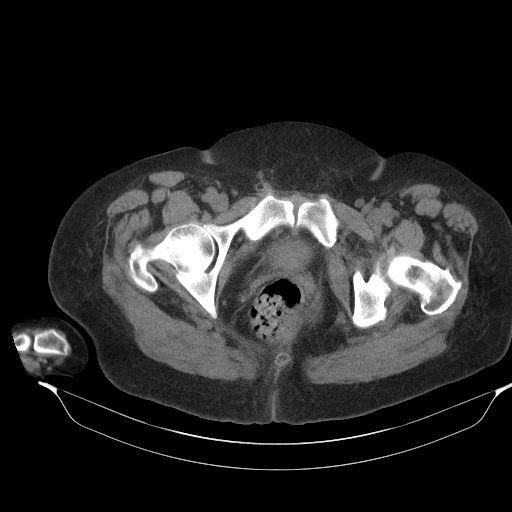
[im 12/94  bone]
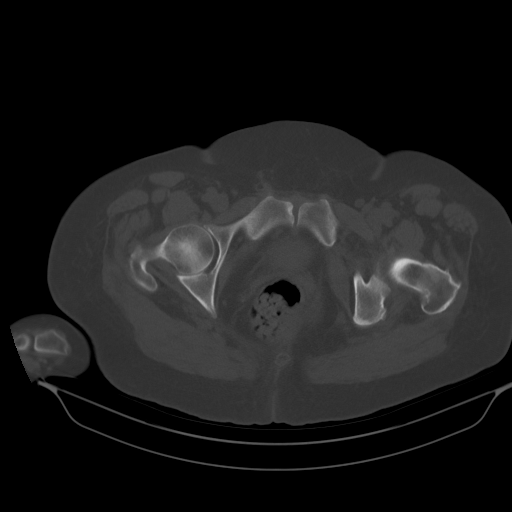
[im 24/94  soft-tissue]
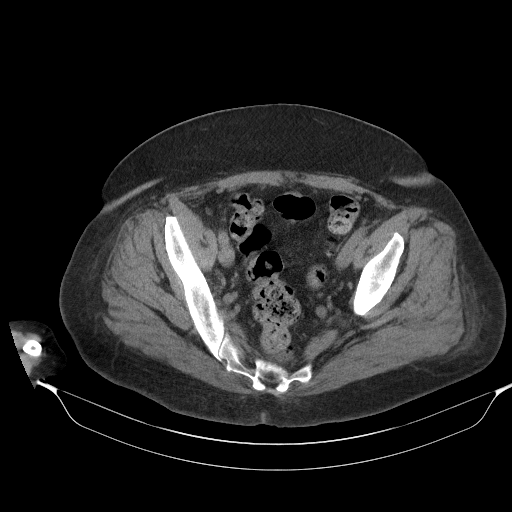
[im 35/94  soft-tissue]
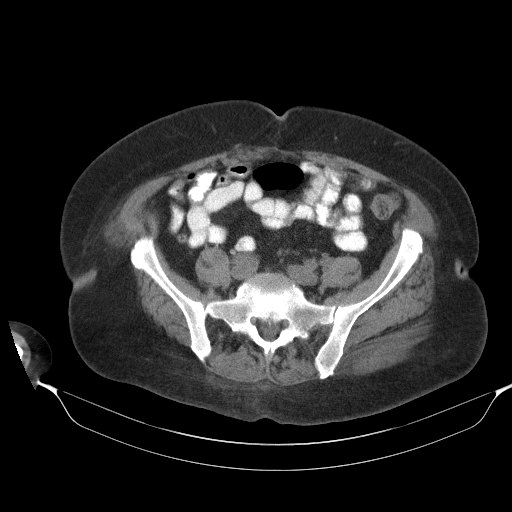
[im 47/94  soft-tissue]
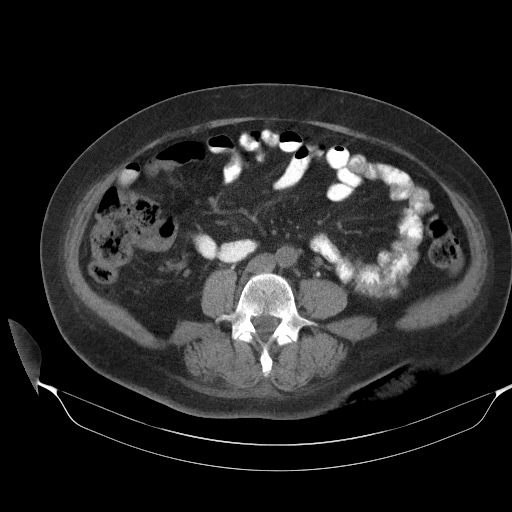
[im 47/94  lung]
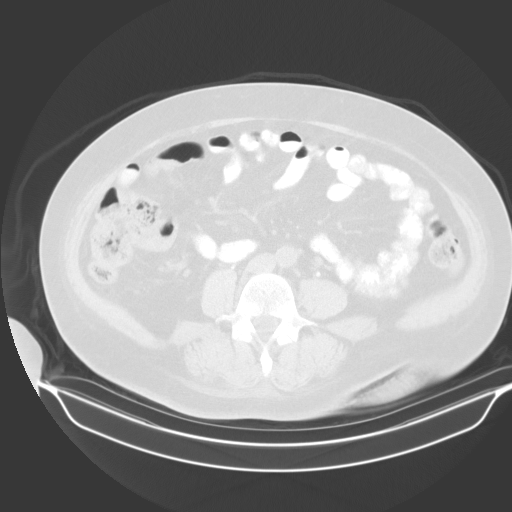
[im 59/94  soft-tissue]
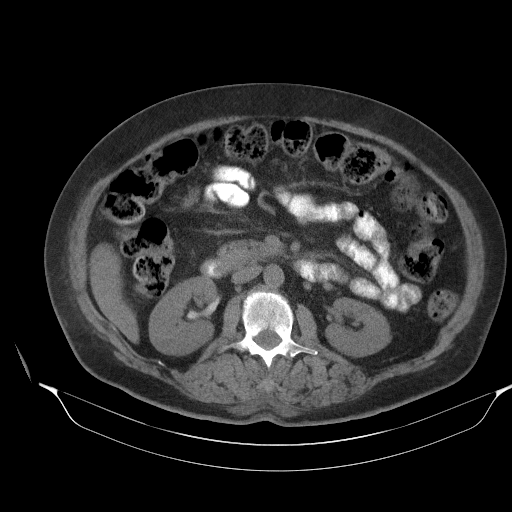
[im 59/94  lung]
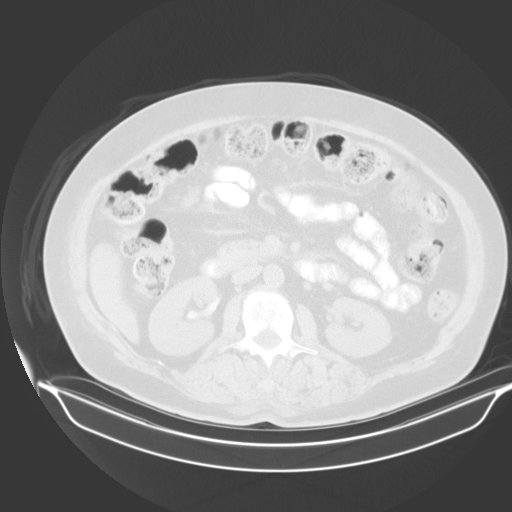
[im 70/94  soft-tissue]
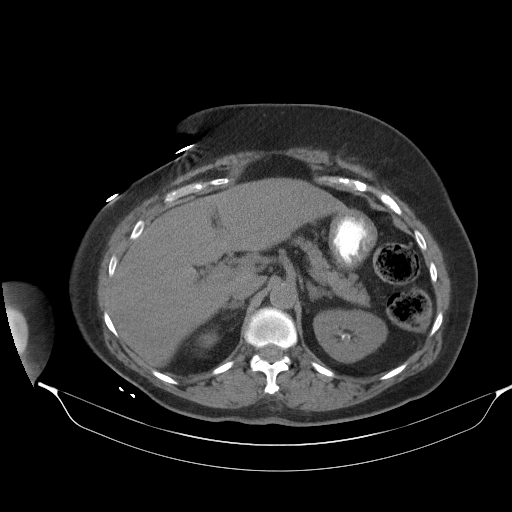
[im 70/94  lung]
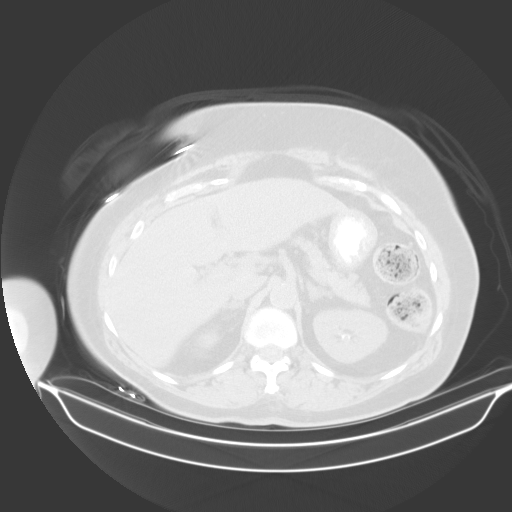
[im 82/94  soft-tissue]
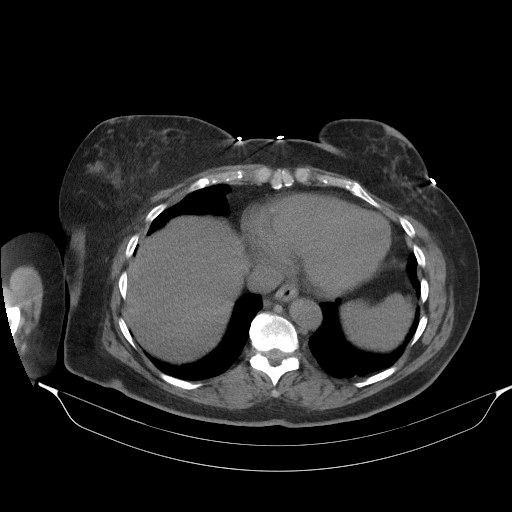
[im 82/94  lung]
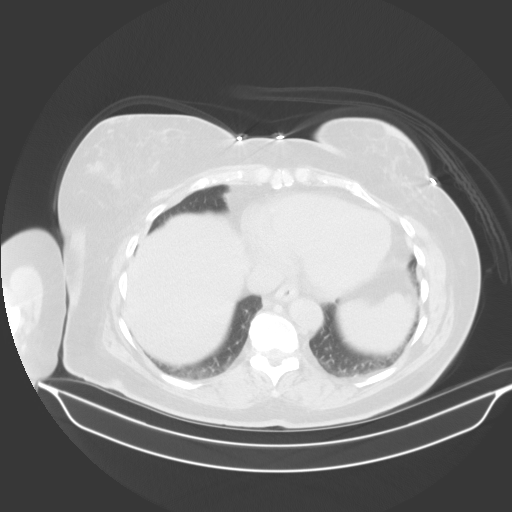

[Series 5: lung · axial · 0.90mm/px · z∈[-385,-45]mm · 7 of 235 slices shown]
[im 22/235  bone]
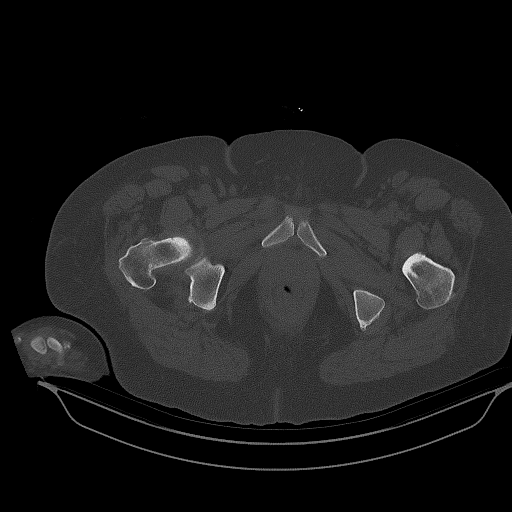
[im 43/235  bone]
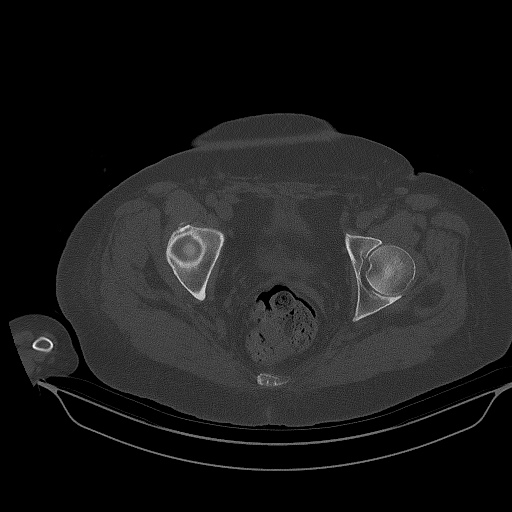
[im 75/235  bone]
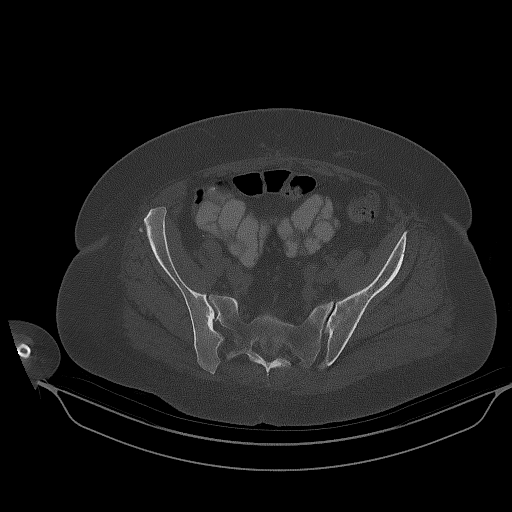
[im 107/235  bone]
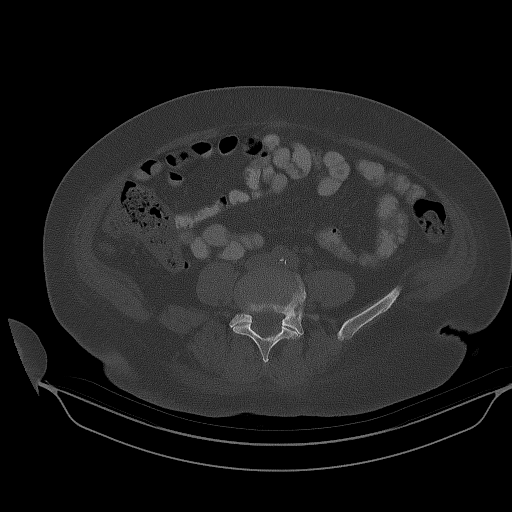
[im 128/235  bone]
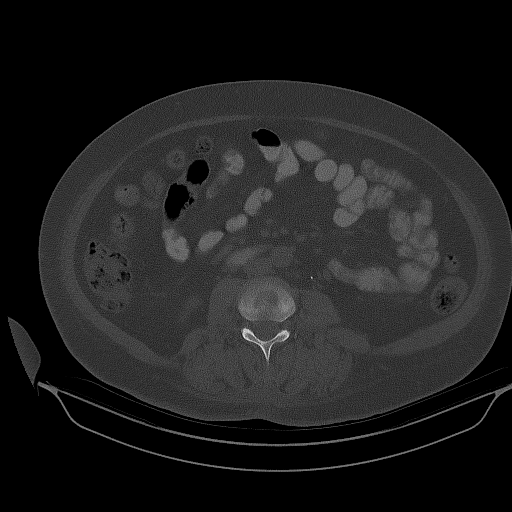
[im 160/235  bone]
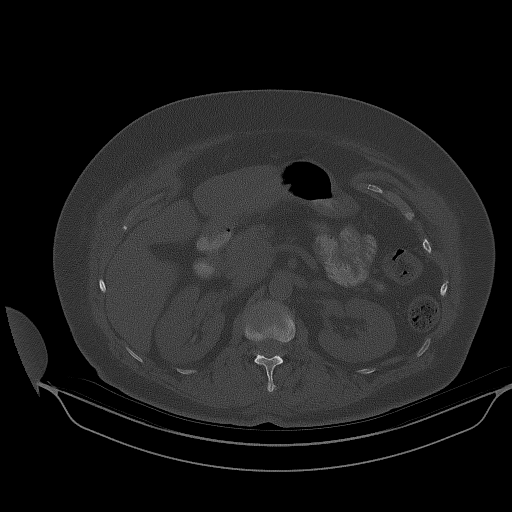
[im 192/235  bone]
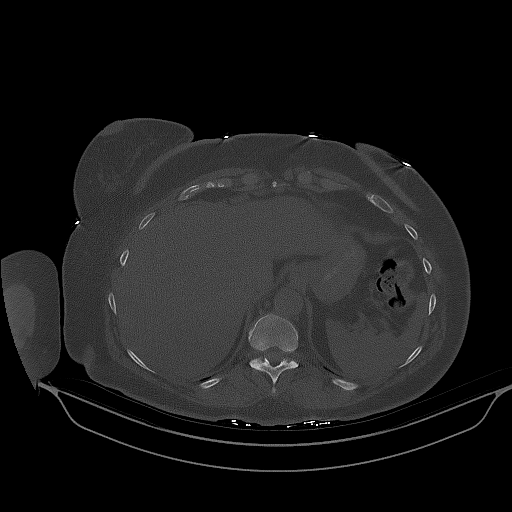

[14 of 32 positions shown; findings below may reference images not displayed]

FINDINGS: Lower chest: No acute abnormality.

Hepatobiliary: No focal liver abnormality is seen. Status post
cholecystectomy. No biliary dilatation.

Pancreas: Unremarkable. No pancreatic ductal dilatation or
surrounding inflammatory changes.

Spleen: Normal in size without focal abnormality.

Adrenals/Urinary Tract: Normal appearance of the right adrenal
gland. 1.4 cm left adrenal adenoma. Unchanged. High attenuation is
identified within the collecting systems both kidneys and within the
urinary bladder. No kidney mass or hydronephrosis identified. The
urinary bladder appears normal.

Stomach/Bowel: Stomach appears normal. The small bowel loops have a
normal course and caliber. Normal appearance of the appendix.
Unremarkable appearance of the colon.

Vascular/Lymphatic: Aortic atherosclerosis. No aneurysm. No upper
abdominal adenopathy identified. No pelvic or inguinal adenopathy.

Reproductive: Status post hysterectomy. No adnexal masses.

Other: Status post repair of umbilical hernia. No abdominopelvic
ascites.

Musculoskeletal: No acute or significant osseous findings.
IMPRESSION: 1. No acute findings within the abdomen or pelvis.
2. Status post hysterectomy and cholecystectomy.
3.  Aortic Atherosclerosis ([id number]).
4. High attenuation material within the collecting systems and
urinary bladder is noted. Has the patient had any intravenous
contrast enhanced imaging performed at an outside facility?

## 2018-07-12 MED ORDER — IOPAMIDOL (ISOVUE-300) INJECTION 61%: 100.0000 mL | Freq: Once | INTRAVENOUS | Status: DC | PRN

## 2018-07-13 DIAGNOSIS — E669 Obesity, unspecified: Secondary | ICD-10-CM | POA: Diagnosis not present

## 2018-07-13 DIAGNOSIS — D649 Anemia, unspecified: Secondary | ICD-10-CM | POA: Diagnosis not present

## 2018-07-13 DIAGNOSIS — E119 Type 2 diabetes mellitus without complications: Secondary | ICD-10-CM | POA: Diagnosis not present

## 2018-07-13 DIAGNOSIS — I10 Essential (primary) hypertension: Secondary | ICD-10-CM | POA: Diagnosis not present

## 2018-07-13 DIAGNOSIS — Z8701 Personal history of pneumonia (recurrent): Secondary | ICD-10-CM | POA: Diagnosis not present

## 2018-07-13 DIAGNOSIS — Z471 Aftercare following joint replacement surgery: Secondary | ICD-10-CM | POA: Diagnosis not present

## 2018-07-14 DIAGNOSIS — E119 Type 2 diabetes mellitus without complications: Secondary | ICD-10-CM | POA: Diagnosis not present

## 2018-07-14 DIAGNOSIS — E669 Obesity, unspecified: Secondary | ICD-10-CM | POA: Diagnosis not present

## 2018-07-14 DIAGNOSIS — D649 Anemia, unspecified: Secondary | ICD-10-CM | POA: Diagnosis not present

## 2018-07-14 DIAGNOSIS — I10 Essential (primary) hypertension: Secondary | ICD-10-CM | POA: Diagnosis not present

## 2018-07-14 DIAGNOSIS — Z8701 Personal history of pneumonia (recurrent): Secondary | ICD-10-CM | POA: Diagnosis not present

## 2018-07-14 DIAGNOSIS — Z471 Aftercare following joint replacement surgery: Secondary | ICD-10-CM | POA: Diagnosis not present

## 2018-07-17 DIAGNOSIS — E669 Obesity, unspecified: Secondary | ICD-10-CM | POA: Diagnosis not present

## 2018-07-17 DIAGNOSIS — D649 Anemia, unspecified: Secondary | ICD-10-CM | POA: Diagnosis not present

## 2018-07-17 DIAGNOSIS — Z471 Aftercare following joint replacement surgery: Secondary | ICD-10-CM | POA: Diagnosis not present

## 2018-07-17 DIAGNOSIS — I10 Essential (primary) hypertension: Secondary | ICD-10-CM | POA: Diagnosis not present

## 2018-07-17 DIAGNOSIS — Z8701 Personal history of pneumonia (recurrent): Secondary | ICD-10-CM | POA: Diagnosis not present

## 2018-07-17 DIAGNOSIS — E119 Type 2 diabetes mellitus without complications: Secondary | ICD-10-CM | POA: Diagnosis not present

## 2018-07-18 DIAGNOSIS — Z8701 Personal history of pneumonia (recurrent): Secondary | ICD-10-CM | POA: Diagnosis not present

## 2018-07-18 DIAGNOSIS — I10 Essential (primary) hypertension: Secondary | ICD-10-CM | POA: Diagnosis not present

## 2018-07-18 DIAGNOSIS — Z471 Aftercare following joint replacement surgery: Secondary | ICD-10-CM | POA: Diagnosis not present

## 2018-07-18 DIAGNOSIS — E119 Type 2 diabetes mellitus without complications: Secondary | ICD-10-CM | POA: Diagnosis not present

## 2018-07-18 DIAGNOSIS — E669 Obesity, unspecified: Secondary | ICD-10-CM | POA: Diagnosis not present

## 2018-07-18 DIAGNOSIS — D649 Anemia, unspecified: Secondary | ICD-10-CM | POA: Diagnosis not present

## 2018-07-19 DIAGNOSIS — Z79899 Other long term (current) drug therapy: Secondary | ICD-10-CM | POA: Diagnosis not present

## 2018-07-19 DIAGNOSIS — R252 Cramp and spasm: Secondary | ICD-10-CM | POA: Diagnosis not present

## 2018-07-19 DIAGNOSIS — M25511 Pain in right shoulder: Secondary | ICD-10-CM | POA: Diagnosis not present

## 2018-07-19 DIAGNOSIS — M0579 Rheumatoid arthritis with rheumatoid factor of multiple sites without organ or systems involvement: Secondary | ICD-10-CM | POA: Diagnosis not present

## 2018-07-19 DIAGNOSIS — M47812 Spondylosis without myelopathy or radiculopathy, cervical region: Secondary | ICD-10-CM | POA: Diagnosis not present

## 2018-07-19 DIAGNOSIS — R202 Paresthesia of skin: Secondary | ICD-10-CM | POA: Diagnosis not present

## 2018-07-19 DIAGNOSIS — Z13820 Encounter for screening for osteoporosis: Secondary | ICD-10-CM | POA: Diagnosis not present

## 2018-07-19 DIAGNOSIS — M79643 Pain in unspecified hand: Secondary | ICD-10-CM | POA: Diagnosis not present

## 2018-07-19 DIAGNOSIS — M1990 Unspecified osteoarthritis, unspecified site: Secondary | ICD-10-CM | POA: Diagnosis not present

## 2018-07-19 DIAGNOSIS — R21 Rash and other nonspecific skin eruption: Secondary | ICD-10-CM | POA: Diagnosis not present

## 2018-07-20 DIAGNOSIS — K5730 Diverticulosis of large intestine without perforation or abscess without bleeding: Secondary | ICD-10-CM | POA: Diagnosis not present

## 2018-07-20 DIAGNOSIS — Z471 Aftercare following joint replacement surgery: Secondary | ICD-10-CM | POA: Diagnosis not present

## 2018-07-20 DIAGNOSIS — K5900 Constipation, unspecified: Secondary | ICD-10-CM | POA: Diagnosis not present

## 2018-07-20 DIAGNOSIS — R109 Unspecified abdominal pain: Secondary | ICD-10-CM | POA: Diagnosis not present

## 2018-07-20 DIAGNOSIS — M25811 Other specified joint disorders, right shoulder: Secondary | ICD-10-CM | POA: Diagnosis not present

## 2018-07-20 DIAGNOSIS — K760 Fatty (change of) liver, not elsewhere classified: Secondary | ICD-10-CM | POA: Diagnosis not present

## 2018-07-21 DIAGNOSIS — E1165 Type 2 diabetes mellitus with hyperglycemia: Secondary | ICD-10-CM | POA: Diagnosis not present

## 2018-07-21 DIAGNOSIS — Z471 Aftercare following joint replacement surgery: Secondary | ICD-10-CM | POA: Diagnosis not present

## 2018-07-21 DIAGNOSIS — K760 Fatty (change of) liver, not elsewhere classified: Secondary | ICD-10-CM | POA: Diagnosis not present

## 2018-07-21 DIAGNOSIS — Z8701 Personal history of pneumonia (recurrent): Secondary | ICD-10-CM | POA: Diagnosis not present

## 2018-07-21 DIAGNOSIS — E669 Obesity, unspecified: Secondary | ICD-10-CM | POA: Diagnosis not present

## 2018-07-21 DIAGNOSIS — I10 Essential (primary) hypertension: Secondary | ICD-10-CM | POA: Diagnosis not present

## 2018-07-21 DIAGNOSIS — E119 Type 2 diabetes mellitus without complications: Secondary | ICD-10-CM | POA: Diagnosis not present

## 2018-07-21 DIAGNOSIS — D649 Anemia, unspecified: Secondary | ICD-10-CM | POA: Diagnosis not present

## 2018-07-25 DIAGNOSIS — Z8701 Personal history of pneumonia (recurrent): Secondary | ICD-10-CM | POA: Diagnosis not present

## 2018-07-25 DIAGNOSIS — E119 Type 2 diabetes mellitus without complications: Secondary | ICD-10-CM | POA: Diagnosis not present

## 2018-07-25 DIAGNOSIS — I10 Essential (primary) hypertension: Secondary | ICD-10-CM | POA: Diagnosis not present

## 2018-07-25 DIAGNOSIS — E669 Obesity, unspecified: Secondary | ICD-10-CM | POA: Diagnosis not present

## 2018-07-25 DIAGNOSIS — Z471 Aftercare following joint replacement surgery: Secondary | ICD-10-CM | POA: Diagnosis not present

## 2018-07-25 DIAGNOSIS — D649 Anemia, unspecified: Secondary | ICD-10-CM | POA: Diagnosis not present

## 2018-07-26 ENCOUNTER — Encounter: Payer: Self-pay | Admitting: Neurology

## 2018-07-26 ENCOUNTER — Other Ambulatory Visit: Payer: Self-pay | Admitting: *Deleted

## 2018-07-26 DIAGNOSIS — E669 Obesity, unspecified: Secondary | ICD-10-CM | POA: Diagnosis not present

## 2018-07-26 DIAGNOSIS — R202 Paresthesia of skin: Secondary | ICD-10-CM

## 2018-07-26 DIAGNOSIS — D649 Anemia, unspecified: Secondary | ICD-10-CM | POA: Diagnosis not present

## 2018-07-26 DIAGNOSIS — I10 Essential (primary) hypertension: Secondary | ICD-10-CM | POA: Diagnosis not present

## 2018-07-26 DIAGNOSIS — E119 Type 2 diabetes mellitus without complications: Secondary | ICD-10-CM | POA: Diagnosis not present

## 2018-07-26 DIAGNOSIS — Z471 Aftercare following joint replacement surgery: Secondary | ICD-10-CM | POA: Diagnosis not present

## 2018-07-26 DIAGNOSIS — Z8701 Personal history of pneumonia (recurrent): Secondary | ICD-10-CM | POA: Diagnosis not present

## 2018-07-28 DIAGNOSIS — E669 Obesity, unspecified: Secondary | ICD-10-CM | POA: Diagnosis not present

## 2018-07-28 DIAGNOSIS — I10 Essential (primary) hypertension: Secondary | ICD-10-CM | POA: Diagnosis not present

## 2018-07-28 DIAGNOSIS — E119 Type 2 diabetes mellitus without complications: Secondary | ICD-10-CM | POA: Diagnosis not present

## 2018-07-28 DIAGNOSIS — D649 Anemia, unspecified: Secondary | ICD-10-CM | POA: Diagnosis not present

## 2018-07-28 DIAGNOSIS — Z471 Aftercare following joint replacement surgery: Secondary | ICD-10-CM | POA: Diagnosis not present

## 2018-07-28 DIAGNOSIS — Z8701 Personal history of pneumonia (recurrent): Secondary | ICD-10-CM | POA: Diagnosis not present

## 2018-08-01 DIAGNOSIS — Z471 Aftercare following joint replacement surgery: Secondary | ICD-10-CM | POA: Diagnosis not present

## 2018-08-01 DIAGNOSIS — E119 Type 2 diabetes mellitus without complications: Secondary | ICD-10-CM | POA: Diagnosis not present

## 2018-08-01 DIAGNOSIS — D649 Anemia, unspecified: Secondary | ICD-10-CM | POA: Diagnosis not present

## 2018-08-01 DIAGNOSIS — E669 Obesity, unspecified: Secondary | ICD-10-CM | POA: Diagnosis not present

## 2018-08-01 DIAGNOSIS — I10 Essential (primary) hypertension: Secondary | ICD-10-CM | POA: Diagnosis not present

## 2018-08-01 DIAGNOSIS — Z8701 Personal history of pneumonia (recurrent): Secondary | ICD-10-CM | POA: Diagnosis not present

## 2018-08-04 DIAGNOSIS — I10 Essential (primary) hypertension: Secondary | ICD-10-CM | POA: Diagnosis not present

## 2018-08-04 DIAGNOSIS — Z8701 Personal history of pneumonia (recurrent): Secondary | ICD-10-CM | POA: Diagnosis not present

## 2018-08-04 DIAGNOSIS — Z471 Aftercare following joint replacement surgery: Secondary | ICD-10-CM | POA: Diagnosis not present

## 2018-08-04 DIAGNOSIS — D649 Anemia, unspecified: Secondary | ICD-10-CM | POA: Diagnosis not present

## 2018-08-04 DIAGNOSIS — E669 Obesity, unspecified: Secondary | ICD-10-CM | POA: Diagnosis not present

## 2018-08-04 DIAGNOSIS — E119 Type 2 diabetes mellitus without complications: Secondary | ICD-10-CM | POA: Diagnosis not present

## 2018-08-08 DIAGNOSIS — E669 Obesity, unspecified: Secondary | ICD-10-CM | POA: Diagnosis not present

## 2018-08-08 DIAGNOSIS — Z471 Aftercare following joint replacement surgery: Secondary | ICD-10-CM | POA: Diagnosis not present

## 2018-08-08 DIAGNOSIS — Z8701 Personal history of pneumonia (recurrent): Secondary | ICD-10-CM | POA: Diagnosis not present

## 2018-08-08 DIAGNOSIS — E119 Type 2 diabetes mellitus without complications: Secondary | ICD-10-CM | POA: Diagnosis not present

## 2018-08-08 DIAGNOSIS — D649 Anemia, unspecified: Secondary | ICD-10-CM | POA: Diagnosis not present

## 2018-08-08 DIAGNOSIS — I10 Essential (primary) hypertension: Secondary | ICD-10-CM | POA: Diagnosis not present

## 2018-08-14 DIAGNOSIS — R790 Abnormal level of blood mineral: Secondary | ICD-10-CM | POA: Diagnosis not present

## 2018-08-14 DIAGNOSIS — Z0000 Encounter for general adult medical examination without abnormal findings: Secondary | ICD-10-CM | POA: Diagnosis not present

## 2018-08-14 DIAGNOSIS — Z23 Encounter for immunization: Secondary | ICD-10-CM | POA: Diagnosis not present

## 2018-08-21 DIAGNOSIS — E1142 Type 2 diabetes mellitus with diabetic polyneuropathy: Secondary | ICD-10-CM | POA: Diagnosis not present

## 2018-08-21 DIAGNOSIS — M069 Rheumatoid arthritis, unspecified: Secondary | ICD-10-CM | POA: Diagnosis not present

## 2018-08-21 DIAGNOSIS — E1165 Type 2 diabetes mellitus with hyperglycemia: Secondary | ICD-10-CM | POA: Diagnosis not present

## 2018-08-21 DIAGNOSIS — E041 Nontoxic single thyroid nodule: Secondary | ICD-10-CM | POA: Diagnosis not present

## 2018-08-21 DIAGNOSIS — K760 Fatty (change of) liver, not elsewhere classified: Secondary | ICD-10-CM | POA: Diagnosis not present

## 2018-08-21 DIAGNOSIS — I700 Atherosclerosis of aorta: Secondary | ICD-10-CM | POA: Diagnosis not present

## 2018-08-21 DIAGNOSIS — I10 Essential (primary) hypertension: Secondary | ICD-10-CM | POA: Diagnosis not present

## 2018-08-22 ENCOUNTER — Encounter: Payer: Medicare Other | Admitting: Neurology

## 2018-08-23 ENCOUNTER — Other Ambulatory Visit: Payer: Self-pay | Admitting: Internal Medicine

## 2018-08-24 ENCOUNTER — Other Ambulatory Visit: Payer: Self-pay | Admitting: Internal Medicine

## 2018-08-24 DIAGNOSIS — E041 Nontoxic single thyroid nodule: Secondary | ICD-10-CM

## 2018-08-28 ENCOUNTER — Ambulatory Visit
Admission: RE | Admit: 2018-08-28 | Discharge: 2018-08-28 | Disposition: A | Discharge status: Home / Self Care | Payer: Medicare Other | Source: Ambulatory Visit | Attending: Internal Medicine | Admitting: Internal Medicine

## 2018-08-28 DIAGNOSIS — E041 Nontoxic single thyroid nodule: Secondary | ICD-10-CM

## 2018-08-31 DIAGNOSIS — M349 Systemic sclerosis, unspecified: Secondary | ICD-10-CM | POA: Diagnosis not present

## 2018-08-31 DIAGNOSIS — R202 Paresthesia of skin: Secondary | ICD-10-CM | POA: Diagnosis not present

## 2018-08-31 DIAGNOSIS — R252 Cramp and spasm: Secondary | ICD-10-CM | POA: Diagnosis not present

## 2018-08-31 DIAGNOSIS — Z79899 Other long term (current) drug therapy: Secondary | ICD-10-CM | POA: Diagnosis not present

## 2018-08-31 DIAGNOSIS — M0579 Rheumatoid arthritis with rheumatoid factor of multiple sites without organ or systems involvement: Secondary | ICD-10-CM | POA: Diagnosis not present

## 2018-08-31 DIAGNOSIS — M1990 Unspecified osteoarthritis, unspecified site: Secondary | ICD-10-CM | POA: Diagnosis not present

## 2018-08-31 DIAGNOSIS — M79643 Pain in unspecified hand: Secondary | ICD-10-CM | POA: Diagnosis not present

## 2018-09-22 ENCOUNTER — Other Ambulatory Visit: Payer: Self-pay | Admitting: Internal Medicine

## 2018-09-26 ENCOUNTER — Ambulatory Visit (INDEPENDENT_AMBULATORY_CARE_PROVIDER_SITE_OTHER): Payer: Medicare Other | Admitting: Neurology

## 2018-09-26 DIAGNOSIS — R202 Paresthesia of skin: Secondary | ICD-10-CM

## 2018-09-26 DIAGNOSIS — Z96611 Presence of right artificial shoulder joint: Secondary | ICD-10-CM | POA: Diagnosis not present

## 2018-09-26 DIAGNOSIS — Z471 Aftercare following joint replacement surgery: Secondary | ICD-10-CM | POA: Diagnosis not present

## 2018-09-26 DIAGNOSIS — G5622 Lesion of ulnar nerve, left upper limb: Secondary | ICD-10-CM

## 2018-09-26 NOTE — Procedures (Signed)
[facility]  [address], [address]  [place], [place] [address] Tel: [phone] Fax:  [phone] Test Date:  09/26/2018  Patient: Diane [name] DOB: [birth year] Physician: Narda Amber, DO  Sex: Female Height: 5\' 8"  Ref Phys: Lahoma Rocker, MD  ID#: [id number] Temp: 35.0C Technician:    Patient Complaints: This is a 72 year old female with history of rheumatoid arthritis referred for evaluation of left hand numbness and tingling over the fourth and fifth fingers.  NCV & EMG Findings: Electrodiagnostic testing was limited to nerve conduction only, due to intolerance of testing during the needle EMG component.  Findings are as follows: 1. Left ulnar sensory response is absent.  Left median sensory response is within normal limits. 2. Left ulnar motor response shows markedly reduced amplitude (3.6 mV) and decreased conduction velocity across the elbow (A Elbow-B Elbow, 43 m/s).   3. Needle electrode examination was terminated at patient's request due to pain.    Impression: This is a limited study, as needle electrode examination was not performed due to pain.  Nerve conduction study shows a left ulnar neuropathy across the elbow, which is severe in degree electrically.  A superimposed cervical radiculopathy cannot be excluded.  Correlate clinically.   ___________________________ Narda Amber, DO    Nerve Conduction Studies Anti Sensory Summary Table   Site NR Peak (ms) Norm Peak (ms) P-T Amp (V) Norm P-T Amp  Left Median Anti Sensory (2nd Digit)  35C  Wrist    3.3 <3.8 19.2 >10  Left Ulnar Anti Sensory (5th Digit)  35C  Wrist NR  <3.2  >5   Motor Summary Table   Site NR Onset (ms) Norm Onset (ms) O-P Amp (mV) Norm O-P Amp Site1 Site2 Delta-0 (ms) Dist (cm) Vel (m/s) Norm Vel (m/s)  Left Median Motor (Abd Poll Brev)  35C  Wrist    3.0 <4.0 7.2 >5 Elbow Wrist 5.1 29.0 57 >50  Elbow    8.1  7.0         Left Ulnar Motor (Abd Dig Minimi)  35C  Wrist     2.7 <3.1 3.6 >7 B Elbow Wrist 5.4 28.0 52 >50  B Elbow    8.1  3.0  A Elbow B Elbow 2.3 10.0 43 >50  A Elbow    10.4  3.0         Left Ulnar (FDI) Motor (1st DI)  35C  Wrist    4.1 <4.5 3.0 >7         Comparison Summary Table   Site NR Peak (ms) Norm Peak (ms) P-T Amp (V) Site1 Site2 Delta-P (ms) Norm Delta (ms)  Left Median/Ulnar Palm Comparison (Wrist - 8cm)  35C  Median Palm    2.0 <2.2 18.1 Median Palm Ulnar Palm    Ulnar Palm NR  <2.2           Waveforms:

## 2018-10-06 ENCOUNTER — Other Ambulatory Visit: Payer: Self-pay | Admitting: Internal Medicine

## 2018-10-10 DIAGNOSIS — H2511 Age-related nuclear cataract, right eye: Secondary | ICD-10-CM | POA: Diagnosis not present

## 2018-10-10 DIAGNOSIS — Z961 Presence of intraocular lens: Secondary | ICD-10-CM | POA: Diagnosis not present

## 2018-10-12 DIAGNOSIS — M79643 Pain in unspecified hand: Secondary | ICD-10-CM | POA: Diagnosis not present

## 2018-10-12 DIAGNOSIS — M1990 Unspecified osteoarthritis, unspecified site: Secondary | ICD-10-CM | POA: Diagnosis not present

## 2018-10-12 DIAGNOSIS — G5620 Lesion of ulnar nerve, unspecified upper limb: Secondary | ICD-10-CM | POA: Diagnosis not present

## 2018-10-12 DIAGNOSIS — M0579 Rheumatoid arthritis with rheumatoid factor of multiple sites without organ or systems involvement: Secondary | ICD-10-CM | POA: Diagnosis not present

## 2018-10-12 DIAGNOSIS — R252 Cramp and spasm: Secondary | ICD-10-CM | POA: Diagnosis not present

## 2018-10-12 DIAGNOSIS — Z79899 Other long term (current) drug therapy: Secondary | ICD-10-CM | POA: Diagnosis not present

## 2018-10-12 DIAGNOSIS — M349 Systemic sclerosis, unspecified: Secondary | ICD-10-CM | POA: Diagnosis not present

## 2018-10-12 DIAGNOSIS — R202 Paresthesia of skin: Secondary | ICD-10-CM | POA: Diagnosis not present

## 2018-10-13 DIAGNOSIS — M0579 Rheumatoid arthritis with rheumatoid factor of multiple sites without organ or systems involvement: Secondary | ICD-10-CM | POA: Diagnosis not present

## 2018-10-13 DIAGNOSIS — I10 Essential (primary) hypertension: Secondary | ICD-10-CM | POA: Diagnosis not present

## 2018-10-13 DIAGNOSIS — D509 Iron deficiency anemia, unspecified: Secondary | ICD-10-CM | POA: Diagnosis not present

## 2018-10-19 DIAGNOSIS — M069 Rheumatoid arthritis, unspecified: Secondary | ICD-10-CM | POA: Diagnosis not present

## 2018-10-19 DIAGNOSIS — L819 Disorder of pigmentation, unspecified: Secondary | ICD-10-CM | POA: Diagnosis not present

## 2018-10-19 DIAGNOSIS — Z7984 Long term (current) use of oral hypoglycemic drugs: Secondary | ICD-10-CM | POA: Diagnosis not present

## 2018-10-19 DIAGNOSIS — R0602 Shortness of breath: Secondary | ICD-10-CM | POA: Diagnosis not present

## 2018-10-19 DIAGNOSIS — R768 Other specified abnormal immunological findings in serum: Secondary | ICD-10-CM | POA: Diagnosis not present

## 2018-10-19 DIAGNOSIS — M341 CR(E)ST syndrome: Secondary | ICD-10-CM | POA: Diagnosis not present

## 2018-10-19 DIAGNOSIS — M1990 Unspecified osteoarthritis, unspecified site: Secondary | ICD-10-CM | POA: Diagnosis not present

## 2018-10-19 DIAGNOSIS — M349 Systemic sclerosis, unspecified: Secondary | ICD-10-CM | POA: Diagnosis not present

## 2018-10-19 DIAGNOSIS — R234 Changes in skin texture: Secondary | ICD-10-CM | POA: Diagnosis not present

## 2018-10-19 DIAGNOSIS — E119 Type 2 diabetes mellitus without complications: Secondary | ICD-10-CM | POA: Diagnosis not present

## 2018-10-30 DIAGNOSIS — M341 CR(E)ST syndrome: Secondary | ICD-10-CM | POA: Diagnosis not present

## 2018-10-30 DIAGNOSIS — M1990 Unspecified osteoarthritis, unspecified site: Secondary | ICD-10-CM | POA: Diagnosis not present

## 2018-11-02 DIAGNOSIS — M0579 Rheumatoid arthritis with rheumatoid factor of multiple sites without organ or systems involvement: Secondary | ICD-10-CM | POA: Diagnosis not present

## 2018-11-08 ENCOUNTER — Ambulatory Visit: Payer: Medicare Other | Admitting: Neurology

## 2018-11-09 ENCOUNTER — Other Ambulatory Visit: Payer: Self-pay | Admitting: Internal Medicine

## 2018-11-09 ENCOUNTER — Ambulatory Visit: Payer: Medicare Other | Admitting: Neurology

## 2018-11-13 DIAGNOSIS — H2511 Age-related nuclear cataract, right eye: Secondary | ICD-10-CM | POA: Diagnosis not present

## 2018-11-13 DIAGNOSIS — R011 Cardiac murmur, unspecified: Secondary | ICD-10-CM | POA: Diagnosis not present

## 2018-11-13 DIAGNOSIS — Z885 Allergy status to narcotic agent status: Secondary | ICD-10-CM | POA: Diagnosis not present

## 2018-11-13 DIAGNOSIS — E1136 Type 2 diabetes mellitus with diabetic cataract: Secondary | ICD-10-CM | POA: Diagnosis not present

## 2018-11-13 DIAGNOSIS — Z7984 Long term (current) use of oral hypoglycemic drugs: Secondary | ICD-10-CM | POA: Diagnosis not present

## 2018-11-13 DIAGNOSIS — M1990 Unspecified osteoarthritis, unspecified site: Secondary | ICD-10-CM | POA: Diagnosis not present

## 2018-11-13 DIAGNOSIS — I10 Essential (primary) hypertension: Secondary | ICD-10-CM | POA: Diagnosis not present

## 2018-11-13 DIAGNOSIS — Z79899 Other long term (current) drug therapy: Secondary | ICD-10-CM | POA: Diagnosis not present

## 2018-11-22 ENCOUNTER — Other Ambulatory Visit: Payer: Self-pay | Admitting: Internal Medicine

## 2018-11-23 DIAGNOSIS — R202 Paresthesia of skin: Secondary | ICD-10-CM | POA: Diagnosis not present

## 2018-11-23 DIAGNOSIS — M1990 Unspecified osteoarthritis, unspecified site: Secondary | ICD-10-CM | POA: Diagnosis not present

## 2018-11-23 DIAGNOSIS — R252 Cramp and spasm: Secondary | ICD-10-CM | POA: Diagnosis not present

## 2018-11-23 DIAGNOSIS — M7989 Other specified soft tissue disorders: Secondary | ICD-10-CM | POA: Diagnosis not present

## 2018-11-23 DIAGNOSIS — M0579 Rheumatoid arthritis with rheumatoid factor of multiple sites without organ or systems involvement: Secondary | ICD-10-CM | POA: Diagnosis not present

## 2018-11-23 DIAGNOSIS — M349 Systemic sclerosis, unspecified: Secondary | ICD-10-CM | POA: Diagnosis not present

## 2018-11-23 DIAGNOSIS — Z79899 Other long term (current) drug therapy: Secondary | ICD-10-CM | POA: Diagnosis not present

## 2018-11-23 DIAGNOSIS — M79643 Pain in unspecified hand: Secondary | ICD-10-CM | POA: Diagnosis not present

## 2018-11-23 DIAGNOSIS — G5620 Lesion of ulnar nerve, unspecified upper limb: Secondary | ICD-10-CM | POA: Diagnosis not present

## 2018-11-28 NOTE — Progress Notes (Signed)
NEUROLOGY FOLLOW UP OFFICE NOTE  Diane [name] [id number]  HISTORY OF PRESENT ILLNESS: Diane [name] is a 73 year old woman with mild aortic stenosis, hypertension, non-insulin-dependent diabetes, and autoimmune disorder of skin and hair who follows up for possible primary freezing of gait.  UPDATE:  MRI of lumbar spine from 07/08/2018 was personally reviewed and demonstrated "1.  Dextroconvex scoliosis of the lumbar spine...centered at L3, 2.  Disc bulging and facet hypertrophy at L3-4 [resulting] in mild bilateral foraminal narrowing, worse on the right, 3. Mild bilateral subarticular and moderate bilateral foraminal stenosis at L4-5, 4. Leftward disc protrusion and mild facet hypertrophy at L5-S1 without significant stenosis."  She also endorsed bilateral cramping, numbness and pain in the hands.  She had NCV-EMG of upper extremities on 09/26/18 which demonstrated severe left ulnar neuropathy at the elbow.  However, exam was limited as needle exam not performed due to pain, so cervical radiculopathy could not be ruled out.Marland Kitchen    HISTORY: She began having balance problems in June 2019.  She would be walking and then stop for a moment but she would not be able to move her leg to initiate another step.  It would last a minute.  It has happened about 2 or 3 times.    CT of head without contrast from 04/27/18 personally reviewed and demonstrated chronic small vessel ischemic changes but no acute abnormality.  Repeat CT of head from 06/14/18 was personally reviewed and revealed no changes.  On 06/02/18, she had right shoulder surgery.  After the surgery, she developed urinary incontinence.  She developed significantly worsening gait, specifically inability to move her legs.  She needs assistance getting in and out of chair.  She would start shaking when walking or getting up from a chair.  She denies low back pain or radicular pain, numbness or weakness down the legs.  She denies perineal numbness.   Her hands started burning and cramping.  She has arthritis in the hands.  She was admitted to the hospital again on 06/11/18 for pneumonia.  She is currently in a SNF undergoing physical therapy.  There is no family history of Parkinson's disease.  PAST MEDICAL HISTORY: Past Medical History:  Diagnosis Date  . Abnormal nuclear stress test 10/18/2013  . Arm pain 10/18/2013  . Arthritis   . Cataract    RIGHT EYE  . Chest pain 10/18/2013  . Diabetes mellitus    dx over 5 yrs ago  . GERD (gastroesophageal reflux disease)   . Heart murmur    "yrs ago in [place] -- been here since 1999, "  . History of hiatal hernia   . Hypertension   . Prolonged Q-T interval on ECG 04/20/2017  . Pulmonary edema 04/20/2017    MEDICATIONS: Current Outpatient Medications on File Prior to Visit  Medication Sig Dispense Refill  . amLODipine (NORVASC) 5 MG tablet Take 5 mg by mouth daily.    Marland Kitchen aspirin 81 MG chewable tablet Chew 81 mg by mouth daily.    . DULoxetine (CYMBALTA) 30 MG capsule Take 30 mg by mouth 2 (two) times daily.    Marland Kitchen ENSURE (ENSURE) Take 237 mLs by mouth.    . esomeprazole (NEXIUM) 40 MG capsule Take 40 mg by mouth daily.  2  . ferrous sulfate 325 (65 FE) MG tablet Take 1 tablet (325 mg total) by mouth daily. 30 tablet 0  . fluticasone (FLONASE) 50 MCG/ACT nasal spray Place 1 spray into both nostrils daily as  needed for allergies or rhinitis.    . folic acid (FOLVITE) 1 MG tablet Take 1 mg by mouth daily.   3  . gabapentin (NEURONTIN) 300 MG capsule Take 300 mg by mouth 2 (two) times daily.     . hydroxychloroquine (PLAQUENIL) 200 MG tablet Take 200 mg by mouth daily. WITH FOOD OR MILK  2  . metFORMIN (GLUCOPHAGE-XR) 500 MG 24 hr tablet Take 500 mg by mouth 2 (two) times daily with a meal.   2  . methocarbamol (ROBAXIN) 500 MG tablet Take 1 tablet (500 mg total) by mouth 3 (three) times daily as needed. (Patient taking differently: Take 500 mg by mouth 3 (three) times daily as needed for  muscle spasms. ) 60 tablet 1  . methotrexate (RHEUMATREX) 2.5 MG tablet Take 20 mg by mouth every Saturday. 8 tablets weekly  3  . ondansetron (ZOFRAN) 4 MG tablet Take 4 mg by mouth every 8 (eight) hours as needed for nausea or vomiting.     . promethazine-dextromethorphan (PROMETHAZINE-DM) 6.25-15 MG/5ML syrup Take 5 mLs by mouth 3 (three) times daily as needed for cough.   0  . telmisartan (MICARDIS) 80 MG tablet Take 160 mg by mouth daily.   0  . traZODone (DESYREL) 50 MG tablet Take 50 mg by mouth at bedtime.     No current facility-administered medications on file prior to visit.     ALLERGIES: Allergies  Allergen Reactions  . Cephalexin Other (See Comments)    URINARY RETENTION = "blocked my urine"  . Codeine Rash  . Nucynta [Tapentadol Hcl] Other (See Comments)    Altered mental status    FAMILY HISTORY: Family History  Family history unknown: Yes   SOCIAL HISTORY: Social History   Socioeconomic History  . Marital status: Widowed    Spouse name: Not on file  . Number of children: Not on file  . Years of education: Not on file  . Highest education level: Not on file  Occupational History  . Not on file  Social Needs  . Financial resource strain: Not on file  . Food insecurity:    Worry: Not on file    Inability: Not on file  . Transportation needs:    Medical: Not on file    Non-medical: Not on file  Tobacco Use  . Smoking status: Never Smoker  . Smokeless tobacco: Never Used  Substance and Sexual Activity  . Alcohol use: Never    Frequency: Never  . Drug use: Never  . Sexual activity: Not Currently  Lifestyle  . Physical activity:    Days per week: Not on file    Minutes per session: Not on file  . Stress: Not on file  Relationships  . Social connections:    Talks on phone: Not on file    Gets together: Not on file    Attends religious service: Not on file    Active member of club or organization: Not on file    Attends meetings of clubs or  organizations: Not on file    Relationship status: Not on file  . Intimate partner violence:    Fear of current or ex partner: Not on file    Emotionally abused: Not on file    Physically abused: Not on file    Forced sexual activity: Not on file  Other Topics Concern  . Not on file  Social History Narrative  . Not on file    REVIEW OF SYSTEMS: Constitutional: No fevers,  chills, or sweats, no generalized fatigue, change in appetite Eyes: No visual changes, double vision, eye pain Ear, nose and throat: No hearing loss, ear pain, nasal congestion, sore throat Cardiovascular: No chest pain, palpitations Respiratory:  No shortness of breath at rest or with exertion, wheezes GastrointestinaI: No nausea, vomiting, diarrhea, abdominal pain, fecal incontinence Genitourinary:  No dysuria, urinary retention or frequency Musculoskeletal:  No neck pain, back pain Integumentary: Vitiligo.  No rash, pruritus, skin lesions Neurological: as above Psychiatric: No depression, insomnia, anxiety Endocrine: No palpitations, fatigue, diaphoresis, mood swings, change in appetite, change in weight, increased thirst Hematologic/Lymphatic:  No purpura, petechiae. Allergic/Immunologic: no itchy/runny eyes, nasal congestion, recent allergic reactions, rashes  PHYSICAL EXAM: Blood pressure (!) 106/56, pulse 98, height 5\' 8"  (1.727 m), weight 228 lb (103.4 kg), SpO2 96 %. General: No acute distress.  Patient appears well-groomed.  Head:  Normocephalic/atraumatic Eyes:  Fundi examined but not visualized Neck: supple, no paraspinal tenderness, full range of motion Heart:  Murmur with regular rate.  Pauses noted. Lungs:  Clear to auscultation bilaterally Back: No paraspinal tenderness Neurological Exam: alert and oriented to person, place, and time. Attention span and concentration intact, recent and remote memory intact, fund of knowledge intact.  Speech fluent and not dysarthric, language intact.  CN II-XII  intact. Bulk and tone normal without rigidity; muscle strength 5/5 throughout, no tremor or bradykinesia.  Sensation to pinprick intact and vibration just slightly reduced in feet.  Decreased light touch in 4th and 5th fingers of left hand.  Deep tendon reflexes absent throughout.  Finger to nose testing intact.  No longer requiring wheelchair.  Does not need assistance to stand up (uses both arms to push off) or ambulate.  Mildly wide-based.  Able to pick up feet.  Romberg negative.  IMPRESSION: 1.  Unsteady gait.  Unsteady but improved.  She doesn't present with shuffling or freezing of gait to suggest Parkinson's disease or other Parkinson's plus syndrome.    2.  Left hand numbness (4th and 5th digits).  Likely left ulnar neuropathy at elbow.  However, cervical radiculopathy (C8) cannot be ruled out.  Given the continued balance problems and left hand numbness, we will check MRI of cervical spine to evaluate for cervical spinal stenosis and left C8 radiculopathy.  If unremarkable, then numbness due to ulnar neuropathy. She stated she would not be interested in surgery for ulnar nerve. Unsteady gait uncertain but possibly mild polyneuropathy.  She declines PT.  3.  Murmur with pauses.  PLAN: 1.  MRI of cervical spine 2.  Recommend following up with her cardiologist.  17 minutes spent face to face with patient, over 50% spent discussing diagnosis.Metta Clines, DO  CC:  [name], MD

## 2018-11-29 ENCOUNTER — Ambulatory Visit (INDEPENDENT_AMBULATORY_CARE_PROVIDER_SITE_OTHER): Payer: Medicare Other | Admitting: Neurology

## 2018-11-29 ENCOUNTER — Encounter: Payer: Self-pay | Admitting: Neurology

## 2018-11-29 VITALS — BP 106/56 | HR 98 | Ht 68.0 in | Wt 228.0 lb

## 2018-11-29 DIAGNOSIS — R2681 Unsteadiness on feet: Secondary | ICD-10-CM

## 2018-11-29 DIAGNOSIS — R200 Anesthesia of skin: Secondary | ICD-10-CM | POA: Diagnosis not present

## 2018-11-29 DIAGNOSIS — G5622 Lesion of ulnar nerve, left upper limb: Secondary | ICD-10-CM | POA: Diagnosis not present

## 2018-11-29 DIAGNOSIS — R202 Paresthesia of skin: Secondary | ICD-10-CM

## 2018-11-29 NOTE — Patient Instructions (Addendum)
1.  We will check MRI of cervical spine to evaluate for cervical spinal stenosis. 2.  If MRI does not explain balance issues, then we may not find a specific cause for balance problems.  We have sent a referral to [facility] for your MRI and they will call you directly to schedule your appt. They are located at [place]. If you need to contact them directly please call [phone].

## 2018-12-05 DIAGNOSIS — L308 Other specified dermatitis: Secondary | ICD-10-CM | POA: Diagnosis not present

## 2018-12-05 DIAGNOSIS — E11620 Type 2 diabetes mellitus with diabetic dermatitis: Secondary | ICD-10-CM | POA: Diagnosis not present

## 2018-12-05 DIAGNOSIS — L940 Localized scleroderma [morphea]: Secondary | ICD-10-CM | POA: Diagnosis not present

## 2018-12-05 DIAGNOSIS — M349 Systemic sclerosis, unspecified: Secondary | ICD-10-CM | POA: Diagnosis not present

## 2018-12-05 DIAGNOSIS — I87312 Chronic venous hypertension (idiopathic) with ulcer of left lower extremity: Secondary | ICD-10-CM | POA: Diagnosis not present

## 2018-12-05 DIAGNOSIS — I872 Venous insufficiency (chronic) (peripheral): Secondary | ICD-10-CM | POA: Diagnosis not present

## 2018-12-06 DIAGNOSIS — M0579 Rheumatoid arthritis with rheumatoid factor of multiple sites without organ or systems involvement: Secondary | ICD-10-CM | POA: Diagnosis not present

## 2018-12-11 ENCOUNTER — Ambulatory Visit
Admission: RE | Admit: 2018-12-11 | Discharge: 2018-12-11 | Disposition: A | Discharge status: Home / Self Care | Payer: Medicare Other | Source: Ambulatory Visit | Attending: Neurology | Admitting: Neurology

## 2018-12-11 DIAGNOSIS — I10 Essential (primary) hypertension: Secondary | ICD-10-CM | POA: Diagnosis not present

## 2018-12-11 DIAGNOSIS — G5622 Lesion of ulnar nerve, left upper limb: Secondary | ICD-10-CM

## 2018-12-11 DIAGNOSIS — M50223 Other cervical disc displacement at C6-C7 level: Secondary | ICD-10-CM | POA: Diagnosis not present

## 2018-12-11 DIAGNOSIS — R200 Anesthesia of skin: Secondary | ICD-10-CM

## 2018-12-11 DIAGNOSIS — E1142 Type 2 diabetes mellitus with diabetic polyneuropathy: Secondary | ICD-10-CM | POA: Diagnosis not present

## 2018-12-11 DIAGNOSIS — R2681 Unsteadiness on feet: Secondary | ICD-10-CM

## 2018-12-11 DIAGNOSIS — E1165 Type 2 diabetes mellitus with hyperglycemia: Secondary | ICD-10-CM | POA: Diagnosis not present

## 2018-12-11 DIAGNOSIS — M50222 Other cervical disc displacement at C5-C6 level: Secondary | ICD-10-CM | POA: Diagnosis not present

## 2018-12-11 DIAGNOSIS — K760 Fatty (change of) liver, not elsewhere classified: Secondary | ICD-10-CM | POA: Diagnosis not present

## 2018-12-11 DIAGNOSIS — R202 Paresthesia of skin: Secondary | ICD-10-CM

## 2018-12-11 IMAGING — MR MR CERVICAL SPINE W/O CM
4 of 5 series · 27 of 48 positions shown · non-contrast
Comparison: MRI cervical spine 10/19/2013.

CLINICAL DATA: Unsteady gait. Tingling in the left hand and ulnar
neuropathy of the left elbow.

EXAM:
MRI CERVICAL SPINE WITHOUT CONTRAST
TECHNIQUE: Multiplanar, multisequence MR imaging of the cervical spine was
performed. No intravenous contrast was administered.

[Series 5: T1 · sagittal · 3.0mm · 0.66mm/px · 6 of 13 slices shown]
[im 1/13]
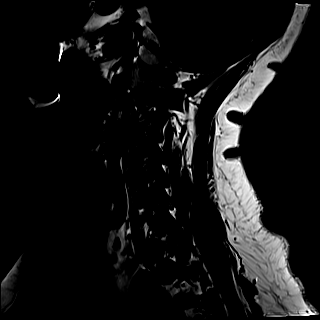
[im 3/13]
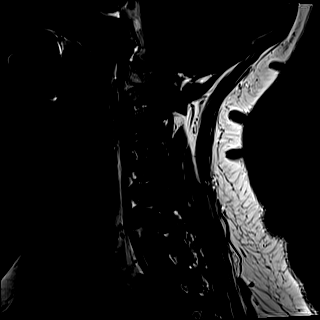
[im 5/13]
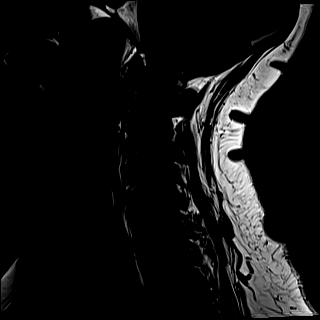
[im 8/13]
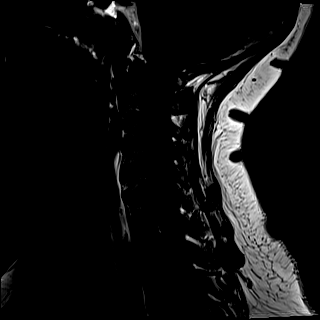
[im 10/13]
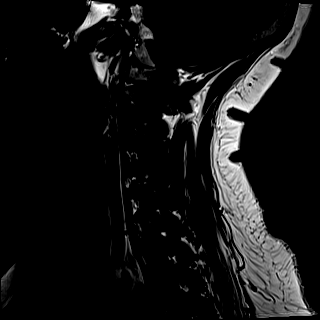
[im 13/13]
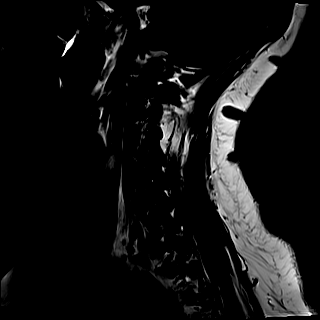

[Series 6: T2 · sagittal · 3.0mm · 0.55mm/px · 7 of 13 slices shown (1 of 2)]
[im 1/13]
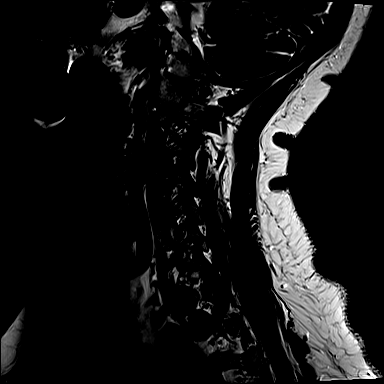
[im 3/13]
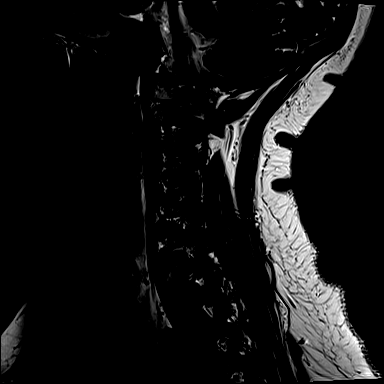
[im 5/13]
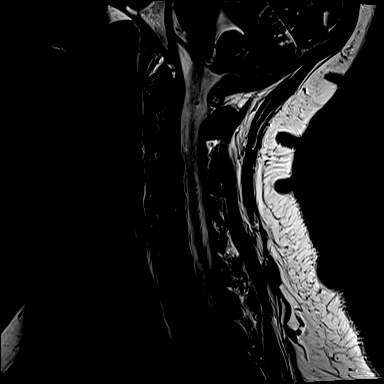
[im 7/13]
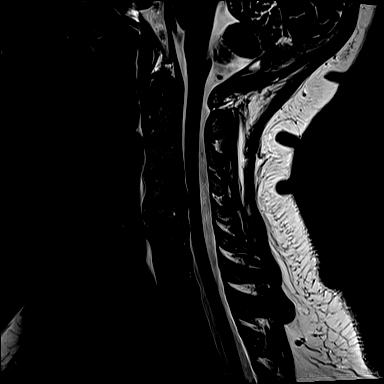
[im 9/13]
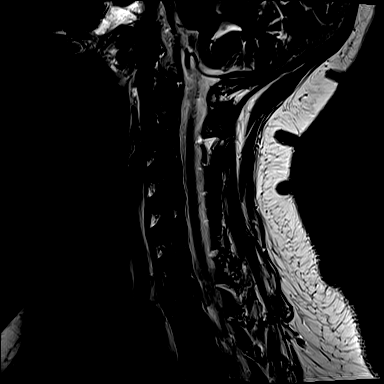
[im 11/13]
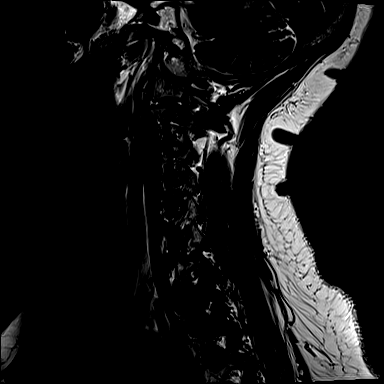
[im 13/13]
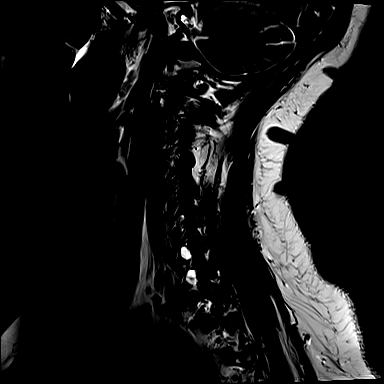

[Series 7: STIR · sagittal · 3.0mm · 0.33mm/px · 6 of 13 slices shown]
[im 1/13]
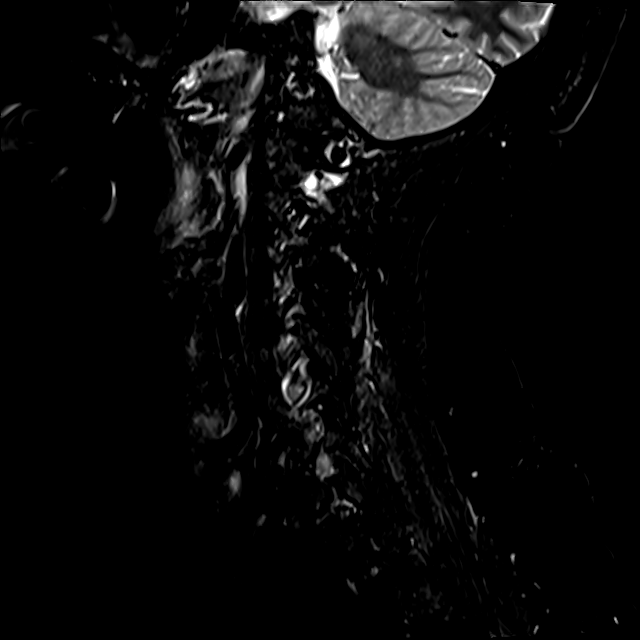
[im 3/13]
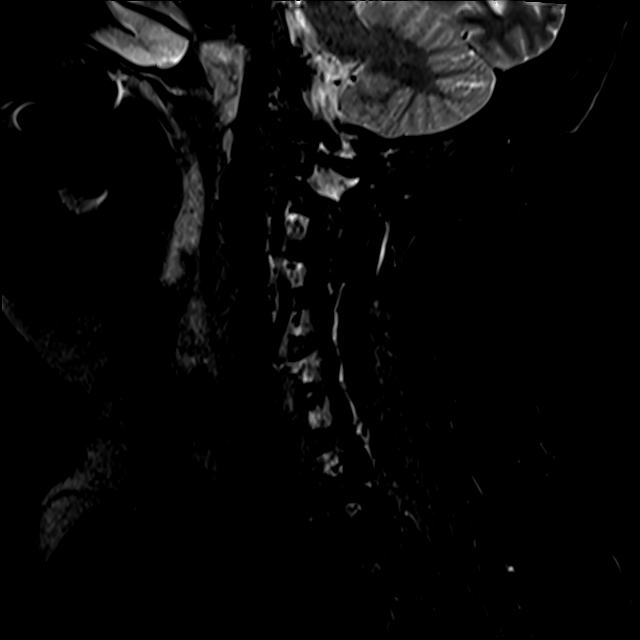
[im 5/13]
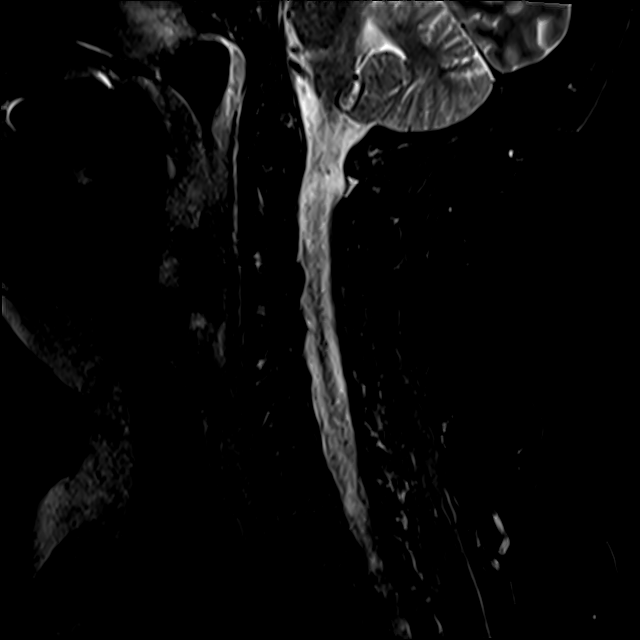
[im 7/13]
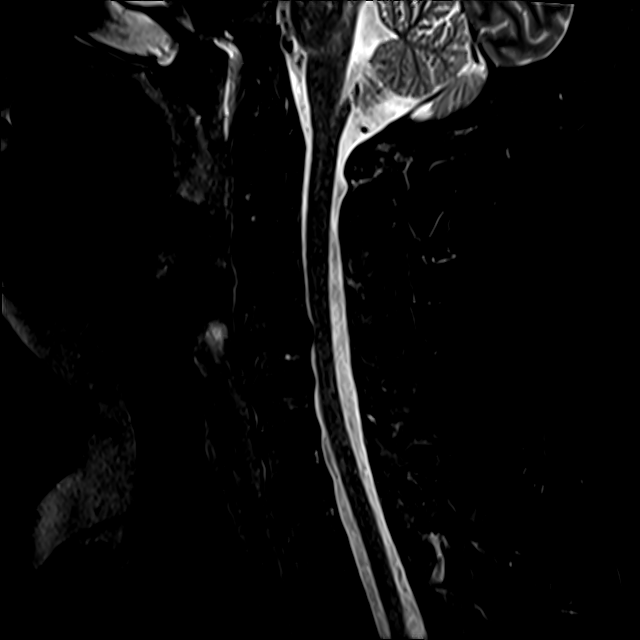
[im 9/13]
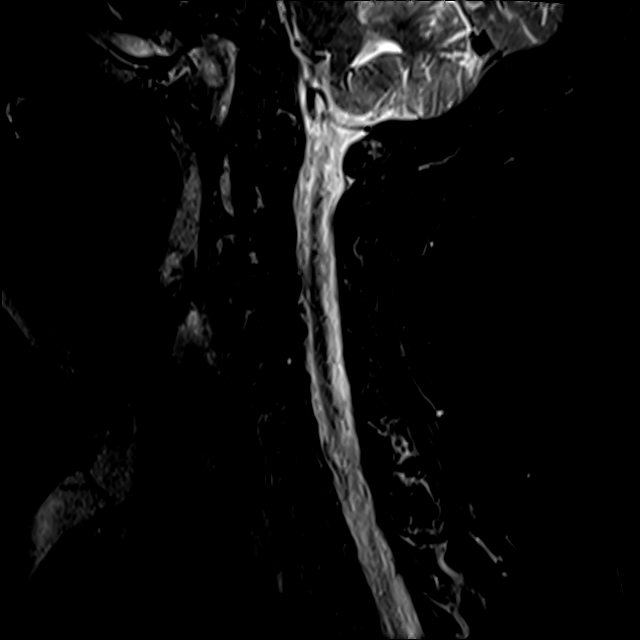
[im 11/13]
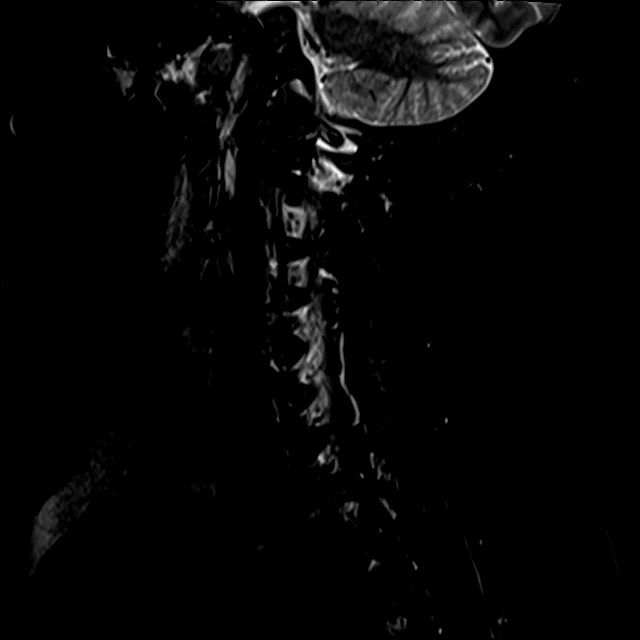

[Series 10: T2 · axial · 3.0mm · 0.50mm/px · z∈[-45,+51]mm · 8 of 28 slices shown (2 of 2)]
[im 1/28]
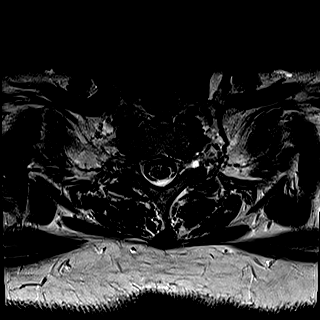
[im 5/28]
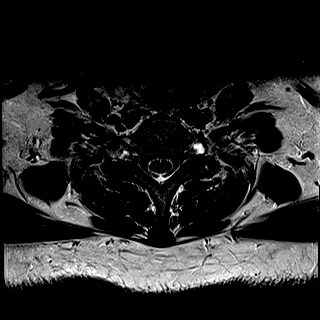
[im 9/28]
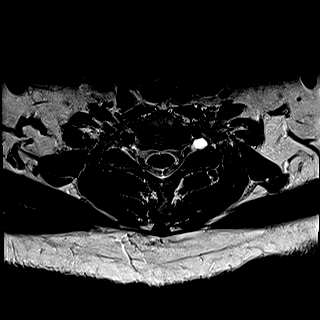
[im 13/28]
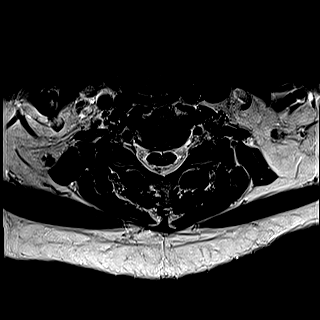
[im 15/28]
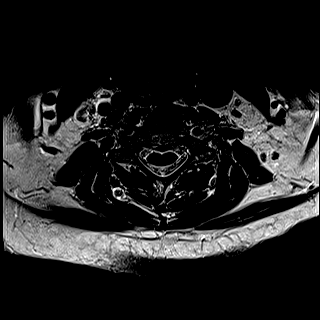
[im 19/28]
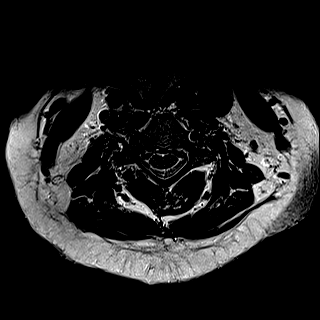
[im 23/28]
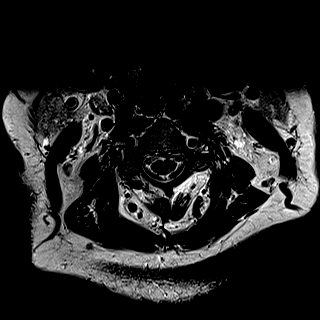
[im 28/28]
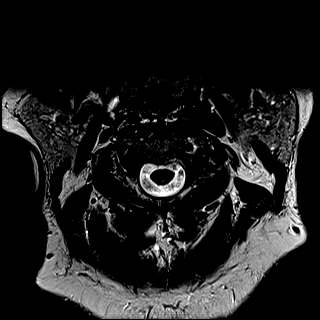

[27 of 48 positions shown; findings below may reference images not displayed]

FINDINGS: Alignment: Maintained.

Vertebrae: Height and signal are unremarkable.

Cord: Normal signal throughout. Patchy T2 hyperintensity in the pons
and midbrain is likely due to chronic microvascular ischemic change.

Posterior Fossa, vertebral arteries, paraspinal tissues: Negative.

Disc levels:

C2-3: Negative.

C3-4: Minimal disc bulge. Mild facet degenerative change. No
stenosis.

C4-5: Shallow central disc protrusion effaces the ventral thecal sac
but the central canal and foramina are widely patent.

C5-6: Minimal disc bulge without stenosis.

C6-7: Minimal disc bulge without stenosis. Small perineural cyst
about the exiting left C7 root is unchanged.

C7-T1: Minimal disc bulge without stenosis.
IMPRESSION: Negative for central canal or foraminal stenosis.

No change in a shallow central disc protrusion effacing the ventral
thecal sac without central canal or foraminal stenosis.

No change in a shallow disc bulge at C5-6 without stenosis.

## 2018-12-13 ENCOUNTER — Other Ambulatory Visit: Payer: Self-pay | Admitting: Internal Medicine

## 2018-12-13 ENCOUNTER — Encounter: Payer: Self-pay | Admitting: *Deleted

## 2018-12-13 ENCOUNTER — Telehealth: Payer: Self-pay | Admitting: *Deleted

## 2018-12-13 NOTE — Telephone Encounter (Signed)
Results sent via My Chart.  

## 2018-12-13 NOTE — Telephone Encounter (Signed)
-----   Message from Pieter Partridge, DO sent at 12/12/2018  6:50 AM EST ----- MRI of cervical spine is unrevealing.

## 2018-12-18 DIAGNOSIS — I700 Atherosclerosis of aorta: Secondary | ICD-10-CM | POA: Diagnosis not present

## 2018-12-18 DIAGNOSIS — I10 Essential (primary) hypertension: Secondary | ICD-10-CM | POA: Diagnosis not present

## 2018-12-18 DIAGNOSIS — K760 Fatty (change of) liver, not elsewhere classified: Secondary | ICD-10-CM | POA: Diagnosis not present

## 2018-12-18 DIAGNOSIS — E1142 Type 2 diabetes mellitus with diabetic polyneuropathy: Secondary | ICD-10-CM | POA: Diagnosis not present

## 2018-12-18 DIAGNOSIS — E1165 Type 2 diabetes mellitus with hyperglycemia: Secondary | ICD-10-CM | POA: Diagnosis not present

## 2018-12-20 DIAGNOSIS — M0579 Rheumatoid arthritis with rheumatoid factor of multiple sites without organ or systems involvement: Secondary | ICD-10-CM | POA: Diagnosis not present

## 2018-12-26 DIAGNOSIS — Z961 Presence of intraocular lens: Secondary | ICD-10-CM | POA: Diagnosis not present

## 2018-12-26 DIAGNOSIS — Z9841 Cataract extraction status, right eye: Secondary | ICD-10-CM | POA: Diagnosis not present

## 2018-12-29 DIAGNOSIS — I10 Essential (primary) hypertension: Secondary | ICD-10-CM | POA: Diagnosis not present

## 2018-12-29 DIAGNOSIS — H9202 Otalgia, left ear: Secondary | ICD-10-CM | POA: Diagnosis not present

## 2018-12-29 DIAGNOSIS — E1165 Type 2 diabetes mellitus with hyperglycemia: Secondary | ICD-10-CM | POA: Diagnosis not present

## 2019-01-02 ENCOUNTER — Other Ambulatory Visit: Payer: Self-pay | Admitting: Internal Medicine

## 2019-01-03 ENCOUNTER — Other Ambulatory Visit: Payer: Self-pay | Admitting: Internal Medicine

## 2019-01-03 DIAGNOSIS — M0579 Rheumatoid arthritis with rheumatoid factor of multiple sites without organ or systems involvement: Secondary | ICD-10-CM | POA: Diagnosis not present

## 2019-01-03 DIAGNOSIS — R51 Headache: Principal | ICD-10-CM

## 2019-01-03 DIAGNOSIS — R519 Headache, unspecified: Secondary | ICD-10-CM

## 2019-01-04 DIAGNOSIS — M349 Systemic sclerosis, unspecified: Secondary | ICD-10-CM | POA: Diagnosis not present

## 2019-01-04 DIAGNOSIS — R202 Paresthesia of skin: Secondary | ICD-10-CM | POA: Diagnosis not present

## 2019-01-04 DIAGNOSIS — H33312 Horseshoe tear of retina without detachment, left eye: Secondary | ICD-10-CM | POA: Diagnosis not present

## 2019-01-04 DIAGNOSIS — H35373 Puckering of macula, bilateral: Secondary | ICD-10-CM | POA: Diagnosis not present

## 2019-01-04 DIAGNOSIS — R252 Cramp and spasm: Secondary | ICD-10-CM | POA: Diagnosis not present

## 2019-01-04 DIAGNOSIS — H43813 Vitreous degeneration, bilateral: Secondary | ICD-10-CM | POA: Diagnosis not present

## 2019-01-04 DIAGNOSIS — H59031 Cystoid macular edema following cataract surgery, right eye: Secondary | ICD-10-CM | POA: Diagnosis not present

## 2019-01-04 DIAGNOSIS — M1990 Unspecified osteoarthritis, unspecified site: Secondary | ICD-10-CM | POA: Diagnosis not present

## 2019-01-04 DIAGNOSIS — M7989 Other specified soft tissue disorders: Secondary | ICD-10-CM | POA: Diagnosis not present

## 2019-01-04 DIAGNOSIS — K760 Fatty (change of) liver, not elsewhere classified: Secondary | ICD-10-CM | POA: Diagnosis not present

## 2019-01-04 DIAGNOSIS — Z79899 Other long term (current) drug therapy: Secondary | ICD-10-CM | POA: Diagnosis not present

## 2019-01-04 DIAGNOSIS — M0579 Rheumatoid arthritis with rheumatoid factor of multiple sites without organ or systems involvement: Secondary | ICD-10-CM | POA: Diagnosis not present

## 2019-01-04 DIAGNOSIS — G5620 Lesion of ulnar nerve, unspecified upper limb: Secondary | ICD-10-CM | POA: Diagnosis not present

## 2019-01-04 DIAGNOSIS — M79643 Pain in unspecified hand: Secondary | ICD-10-CM | POA: Diagnosis not present

## 2019-01-04 DIAGNOSIS — H35033 Hypertensive retinopathy, bilateral: Secondary | ICD-10-CM | POA: Diagnosis not present

## 2019-01-04 DIAGNOSIS — I700 Atherosclerosis of aorta: Secondary | ICD-10-CM | POA: Diagnosis not present

## 2019-01-05 ENCOUNTER — Ambulatory Visit
Admission: RE | Admit: 2019-01-05 | Discharge: 2019-01-05 | Disposition: A | Discharge status: Home / Self Care | Payer: Medicare Other | Source: Ambulatory Visit | Attending: Internal Medicine | Admitting: Internal Medicine

## 2019-01-05 DIAGNOSIS — M0579 Rheumatoid arthritis with rheumatoid factor of multiple sites without organ or systems involvement: Secondary | ICD-10-CM | POA: Diagnosis not present

## 2019-01-05 DIAGNOSIS — J0100 Acute maxillary sinusitis, unspecified: Secondary | ICD-10-CM | POA: Diagnosis not present

## 2019-01-05 DIAGNOSIS — R519 Headache, unspecified: Secondary | ICD-10-CM

## 2019-01-05 DIAGNOSIS — R51 Headache: Principal | ICD-10-CM

## 2019-01-05 DIAGNOSIS — Z79899 Other long term (current) drug therapy: Secondary | ICD-10-CM | POA: Diagnosis not present

## 2019-01-05 IMAGING — CT CT MAXILLOFACIAL W/O CM
1 series · 16 of 30 positions shown, 20 images · non-contrast
Comparison: Paranasal sinus x-ray dated December 29, 2018. CT head
dated June 14, 2018.

CLINICAL DATA: Chronic left-sided facial pain. Indeterminate
opacity overlying the right maxillary sinus on recent x-ray.

EXAM:
CT MAXILLOFACIAL WITHOUT CONTRAST
TECHNIQUE: Multidetector CT imaging of the maxillofacial structures was
performed. Multiplanar CT image reconstructions were also generated.

[Series 4: face soft · axial · 0.44mm/px · z∈[-250,-78]mm · 16 of 94 slices shown, 20 images]
[im 4/94  brain]
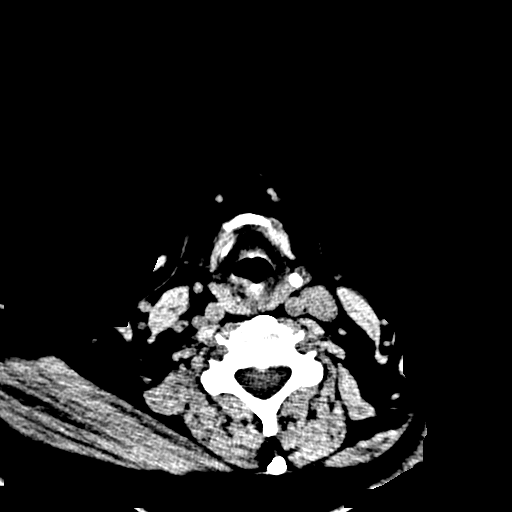
[im 4/94  bone]
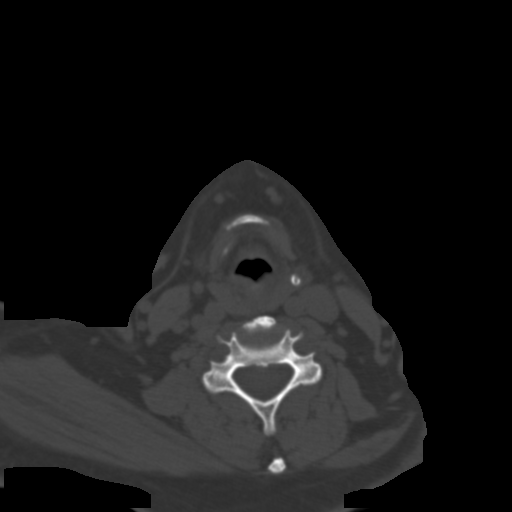
[im 10/94  bone]
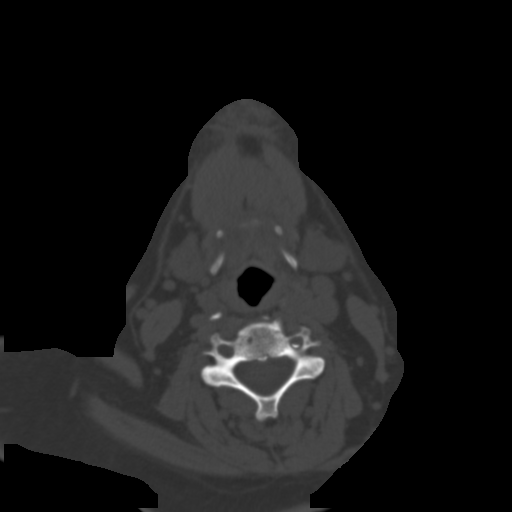
[im 17/94  bone]
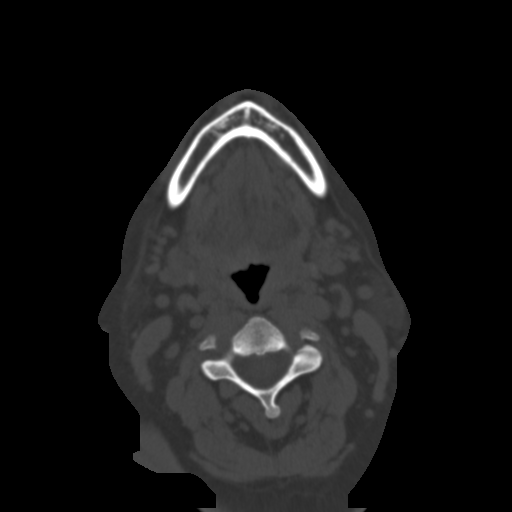
[im 23/94  bone]
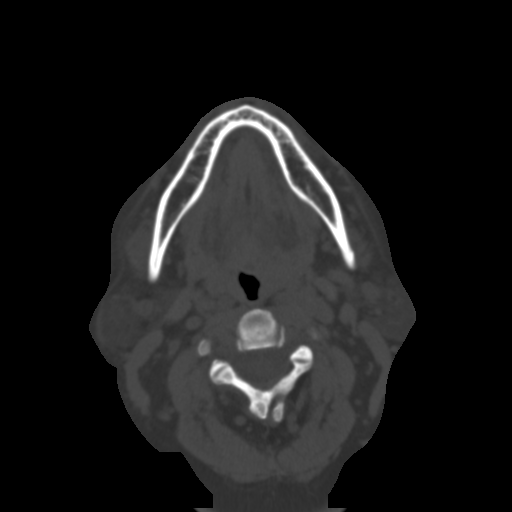
[im 26/94  brain]
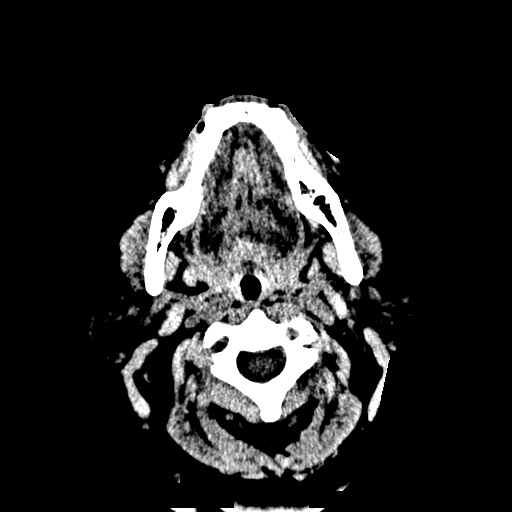
[im 26/94  bone]
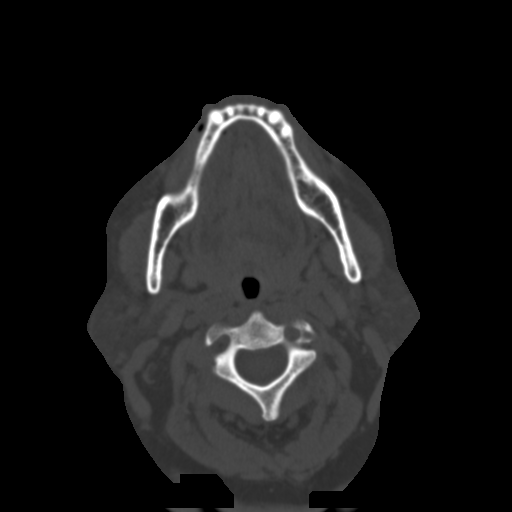
[im 33/94  bone]
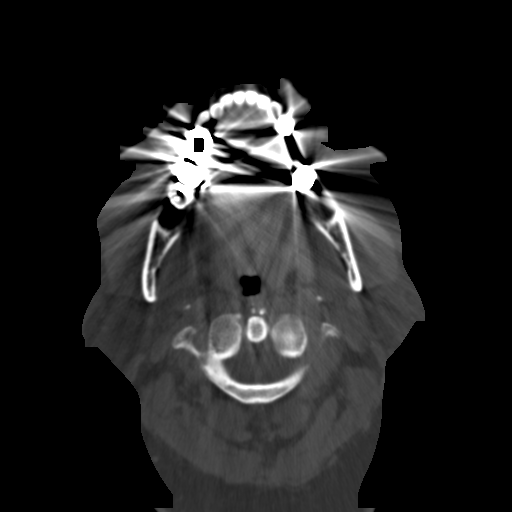
[im 39/94  bone]
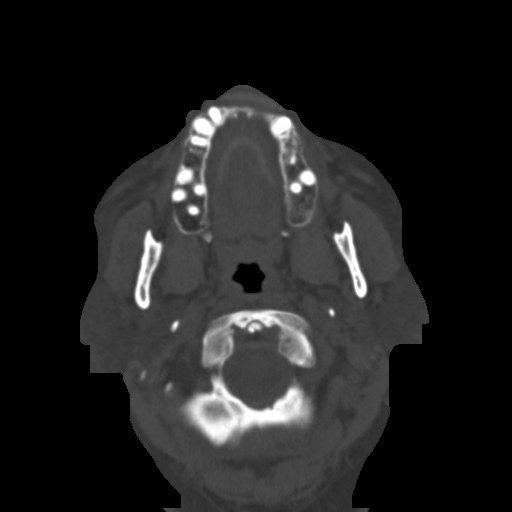
[im 45/94  bone]
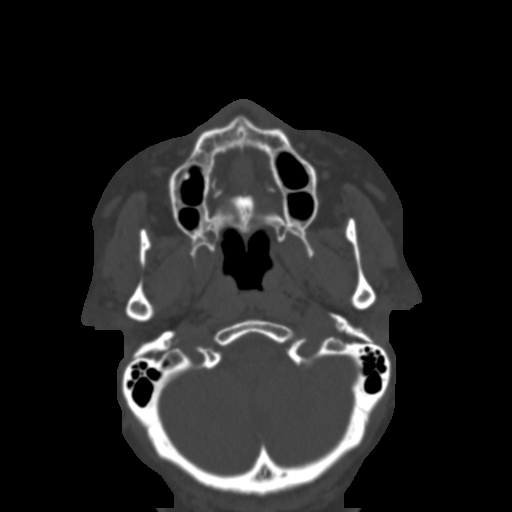
[im 49/94  brain]
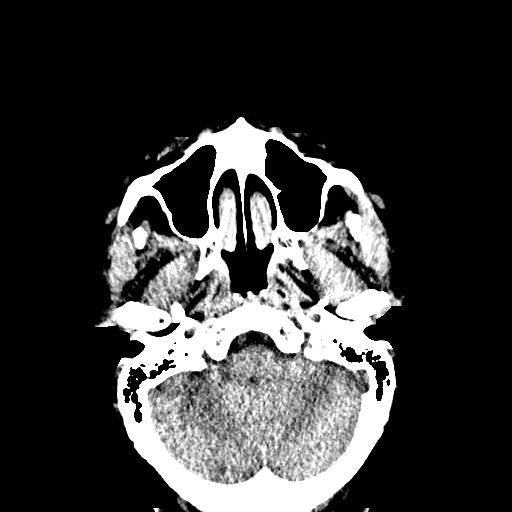
[im 49/94  bone]
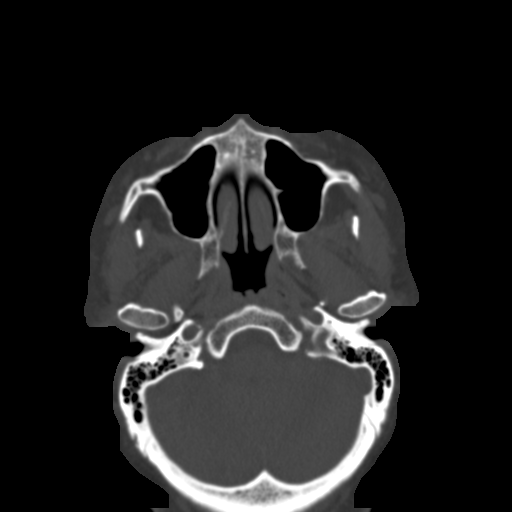
[im 55/94  bone]
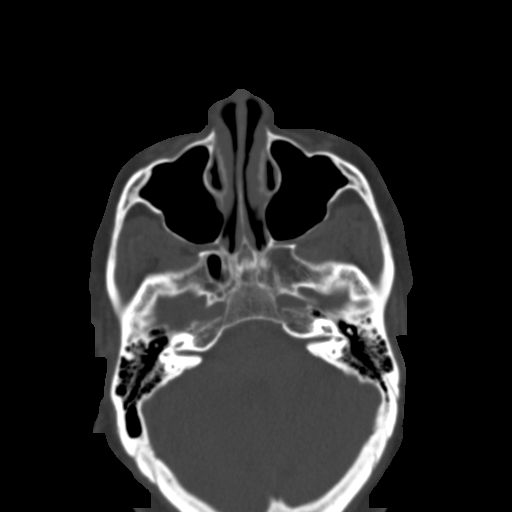
[im 61/94  bone]
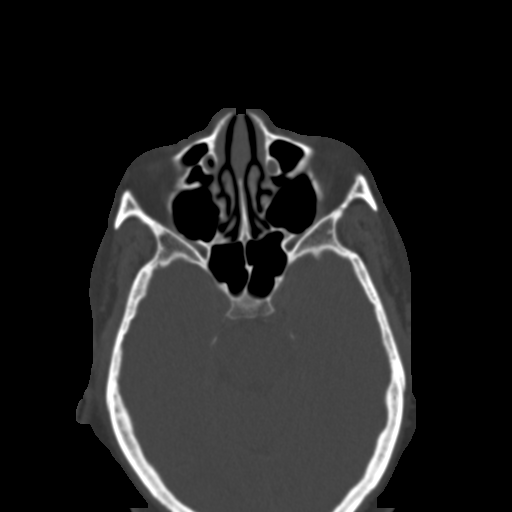
[im 68/94  bone]
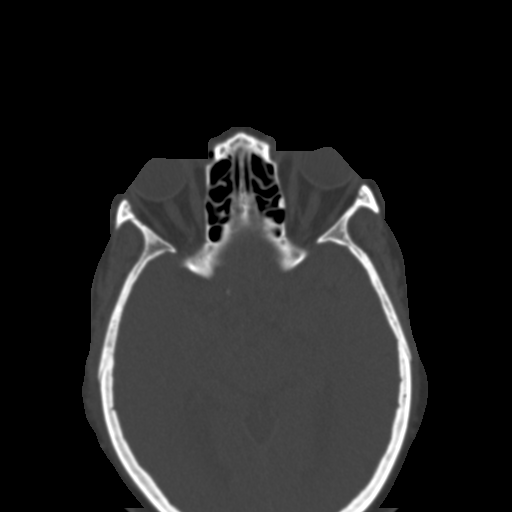
[im 71/94  brain]
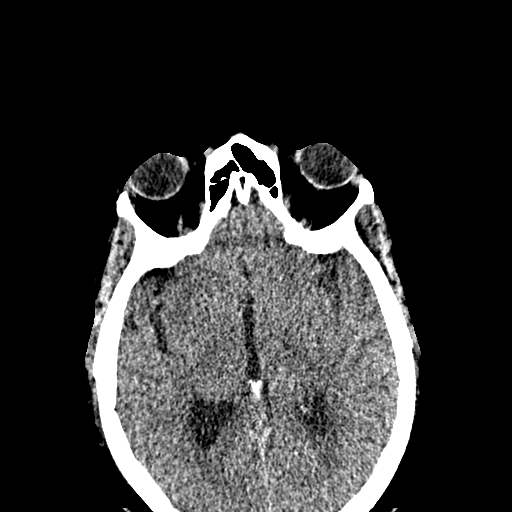
[im 71/94  bone]
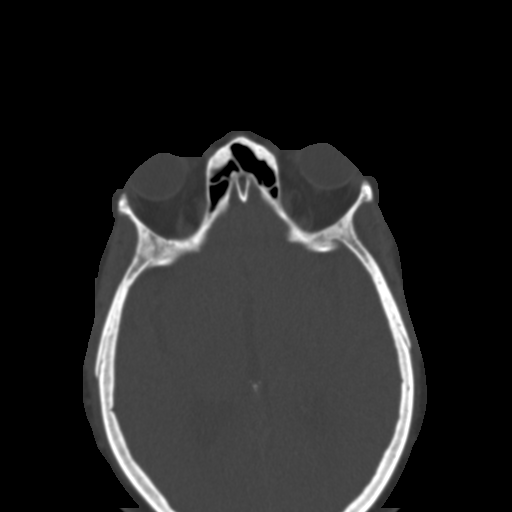
[im 77/94  bone]
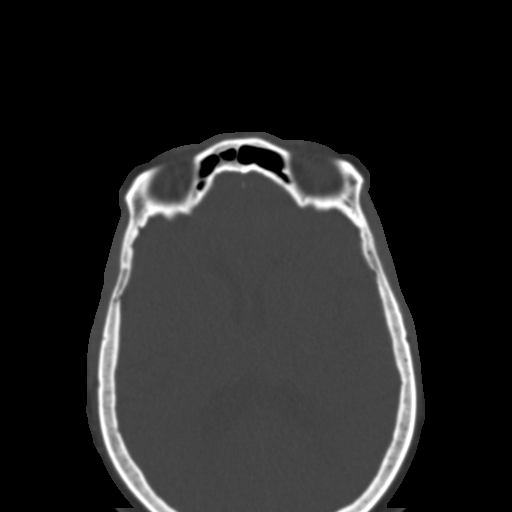
[im 84/94  bone]
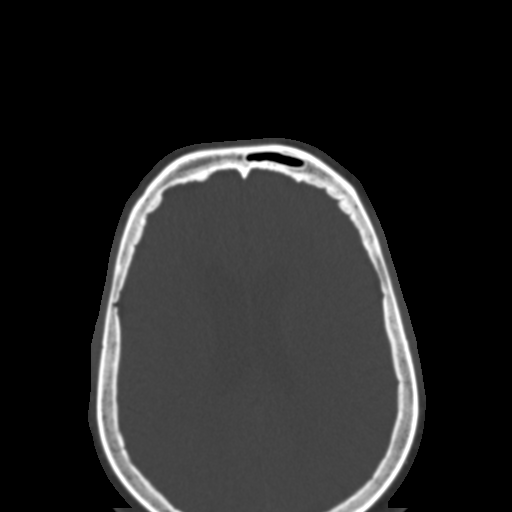
[im 90/94  bone]
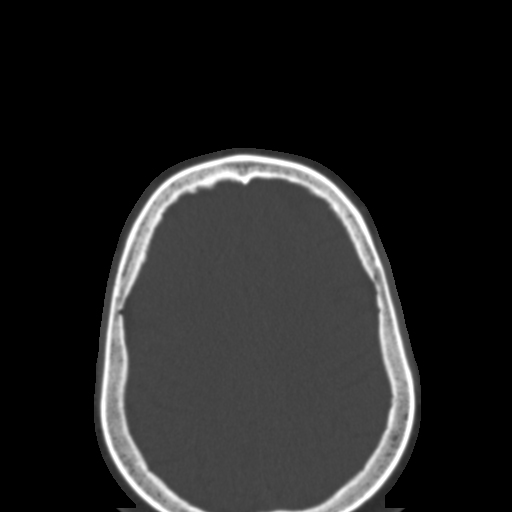

[16 of 30 positions shown; findings below may reference images not displayed]

FINDINGS: Osseous: No fracture or mandibular dislocation. No destructive
process.

Orbits: Negative. No traumatic or inflammatory finding. Prior
bilateral lens replacement.

Sinuses: Minimal opacification of the right ethmoid air cells.
Paranasal sinuses are otherwise clear. The mastoid air cells are
clear.

Soft tissues: Negative.

Limited intracranial: No significant or unexpected finding.
IMPRESSION: 1. No evidence of sinusitis or mass lesion. The opacity projecting
over the right maxillary sinus on recent x-ray corresponds to the
right nasal ala.

## 2019-01-08 DIAGNOSIS — D509 Iron deficiency anemia, unspecified: Secondary | ICD-10-CM | POA: Diagnosis not present

## 2019-01-08 DIAGNOSIS — D649 Anemia, unspecified: Secondary | ICD-10-CM | POA: Diagnosis not present

## 2019-01-10 ENCOUNTER — Other Ambulatory Visit: Payer: Medicare Other

## 2019-01-18 DIAGNOSIS — M0579 Rheumatoid arthritis with rheumatoid factor of multiple sites without organ or systems involvement: Secondary | ICD-10-CM | POA: Diagnosis not present

## 2019-02-01 DIAGNOSIS — H59031 Cystoid macular edema following cataract surgery, right eye: Secondary | ICD-10-CM | POA: Diagnosis not present

## 2019-02-01 DIAGNOSIS — M0579 Rheumatoid arthritis with rheumatoid factor of multiple sites without organ or systems involvement: Secondary | ICD-10-CM | POA: Diagnosis not present

## 2019-02-01 DIAGNOSIS — H35373 Puckering of macula, bilateral: Secondary | ICD-10-CM | POA: Diagnosis not present

## 2019-02-07 ENCOUNTER — Other Ambulatory Visit: Payer: Self-pay | Admitting: Internal Medicine

## 2019-02-15 DIAGNOSIS — M0579 Rheumatoid arthritis with rheumatoid factor of multiple sites without organ or systems involvement: Secondary | ICD-10-CM | POA: Diagnosis not present

## 2019-02-19 DIAGNOSIS — R221 Localized swelling, mass and lump, neck: Secondary | ICD-10-CM | POA: Diagnosis not present

## 2019-02-19 DIAGNOSIS — R1031 Right lower quadrant pain: Secondary | ICD-10-CM | POA: Diagnosis not present

## 2019-02-19 DIAGNOSIS — R1084 Generalized abdominal pain: Secondary | ICD-10-CM | POA: Diagnosis not present

## 2019-02-26 ENCOUNTER — Ambulatory Visit
Admission: RE | Admit: 2019-02-26 | Discharge: 2019-02-26 | Disposition: A | Discharge status: Home / Self Care | Payer: Medicare Other | Source: Ambulatory Visit | Attending: Surgery | Admitting: Surgery

## 2019-02-26 ENCOUNTER — Other Ambulatory Visit: Payer: Self-pay | Admitting: Surgery

## 2019-02-26 ENCOUNTER — Other Ambulatory Visit: Payer: Self-pay

## 2019-02-26 DIAGNOSIS — G8929 Other chronic pain: Secondary | ICD-10-CM

## 2019-02-26 DIAGNOSIS — R1031 Right lower quadrant pain: Secondary | ICD-10-CM | POA: Diagnosis not present

## 2019-02-26 DIAGNOSIS — R109 Unspecified abdominal pain: Secondary | ICD-10-CM | POA: Diagnosis not present

## 2019-02-26 IMAGING — CT CT ABDOMEN AND PELVIS WITH CONTRAST
1 of 3 series · 13 of 32 positions shown, 19 images · IV contrast (APPLIED)
Comparison: Noncontrast CT on 07/12/2018

CLINICAL DATA: Right lower quadrant pain for 3 weeks. Prior
cholecystectomy and hysterectomy.

Creatinine was obtained on site at [HOSPITAL] at [HOSPITAL].
Results: Creatinine 0.6 mg/dL.
EXAM:
CT ABDOMEN AND PELVIS WITH CONTRAST
TECHNIQUE: Multidetector CT imaging of the abdomen and pelvis was performed
using the standard protocol following bolus administration of
intravenous contrast.
CONTRAST:  100mL EFRKDU-AYY IOPAMIDOL (EFRKDU-AYY) INJECTION 61%

[Series 2: abd/pelvis w/cm · axial · 0.86mm/px · z∈[-581,-156]mm · 13 of 99 slices shown, 19 images]
[im 7/99  soft-tissue]
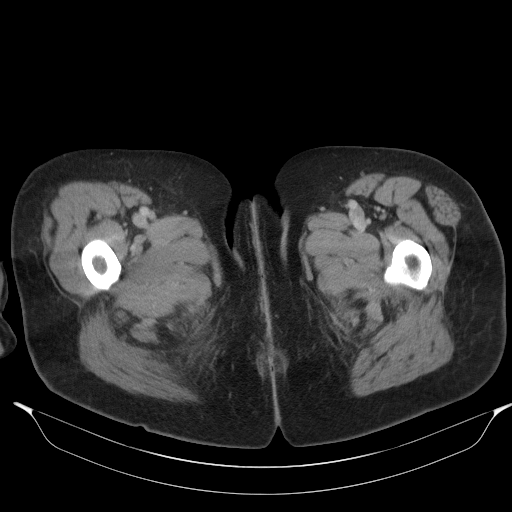
[im 7/99  bone]
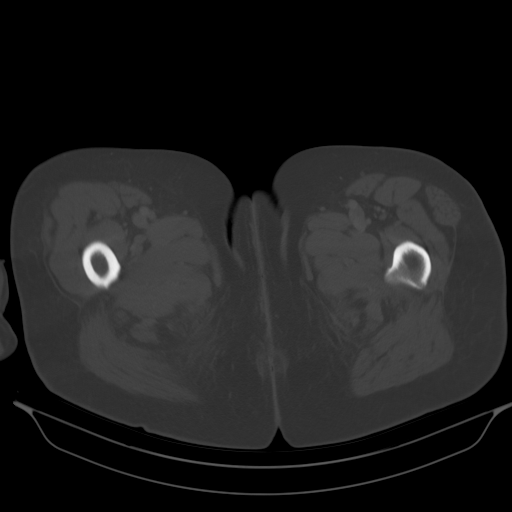
[im 14/99  soft-tissue]
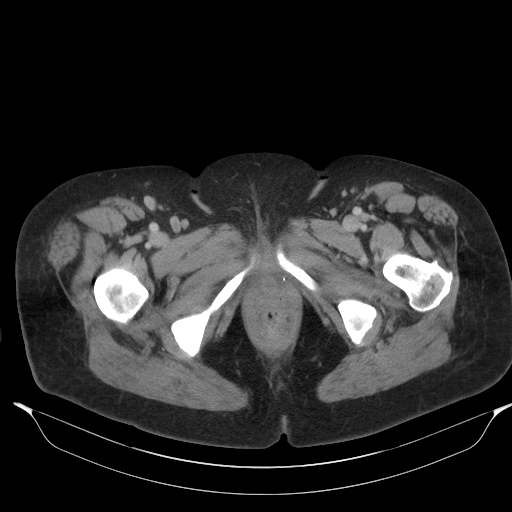
[im 20/99  soft-tissue]
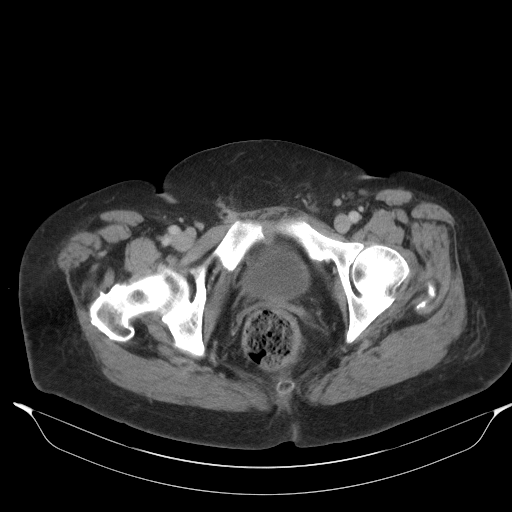
[im 27/99  soft-tissue]
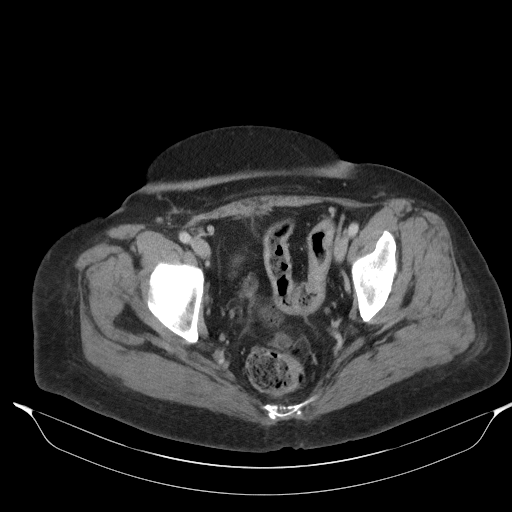
[im 33/99  soft-tissue]
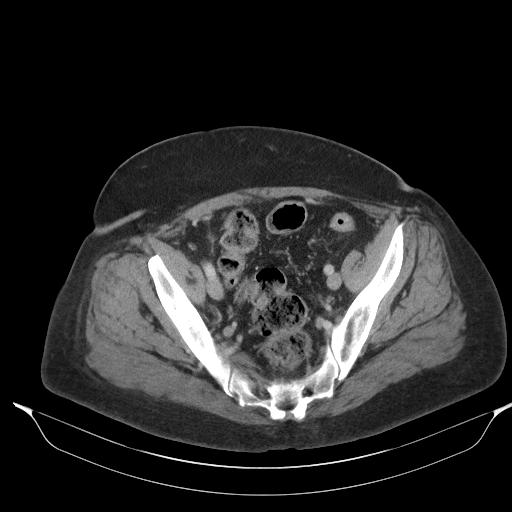
[im 40/99  soft-tissue]
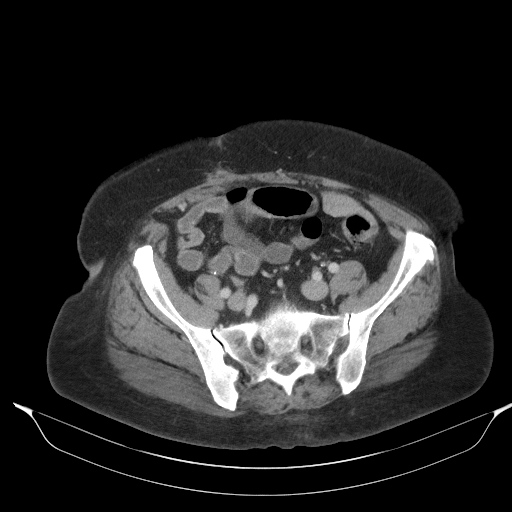
[im 53/99  soft-tissue]
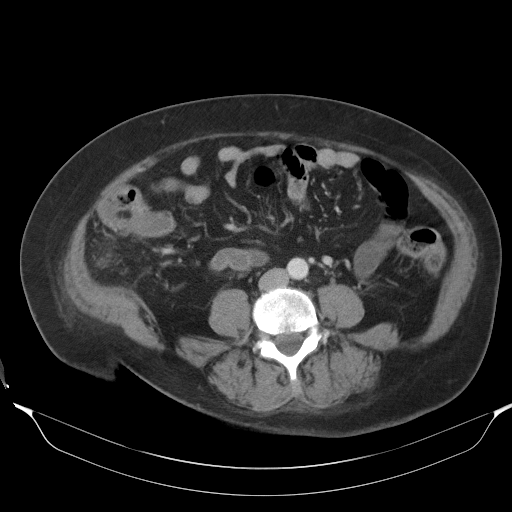
[im 59/99  soft-tissue]
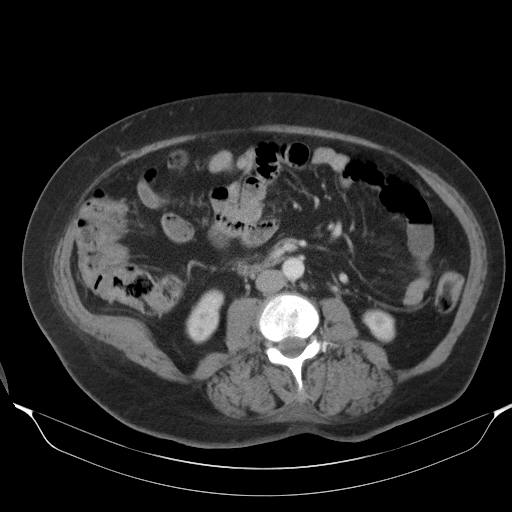
[im 66/99  soft-tissue]
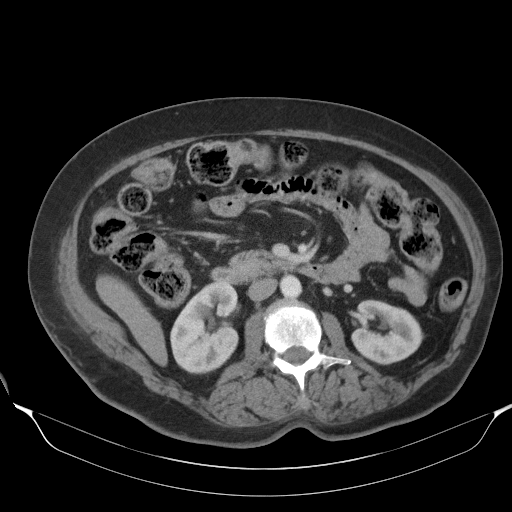
[im 66/99  bone]
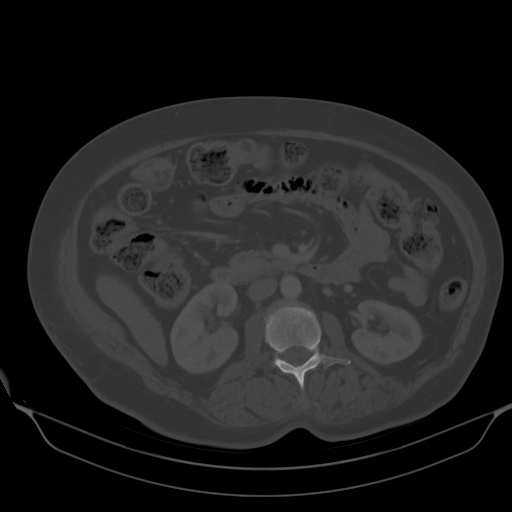
[im 72/99  soft-tissue]
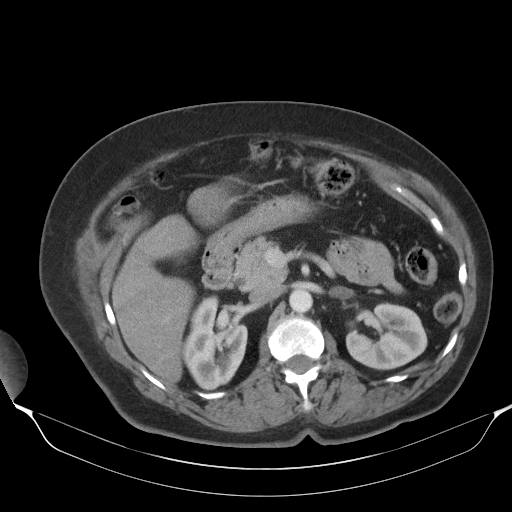
[im 72/99  lung]
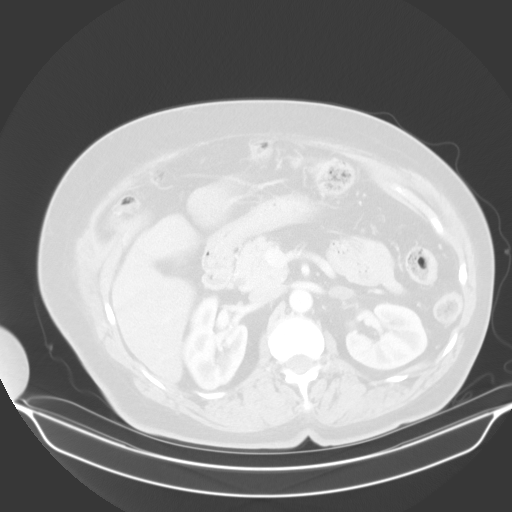
[im 79/99  soft-tissue]
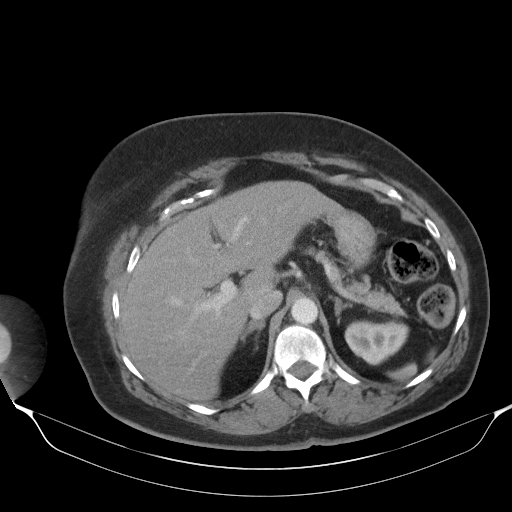
[im 79/99  lung]
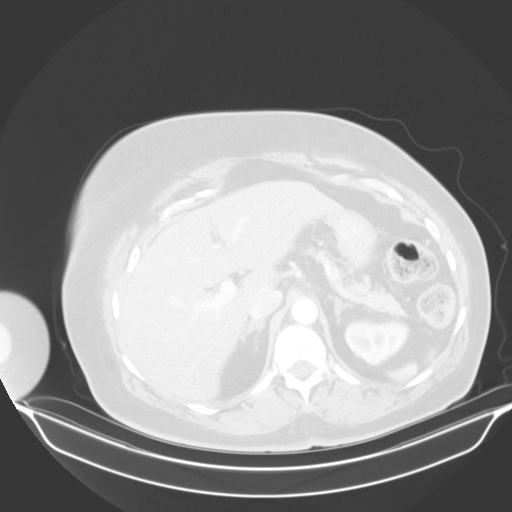
[im 85/99  soft-tissue]
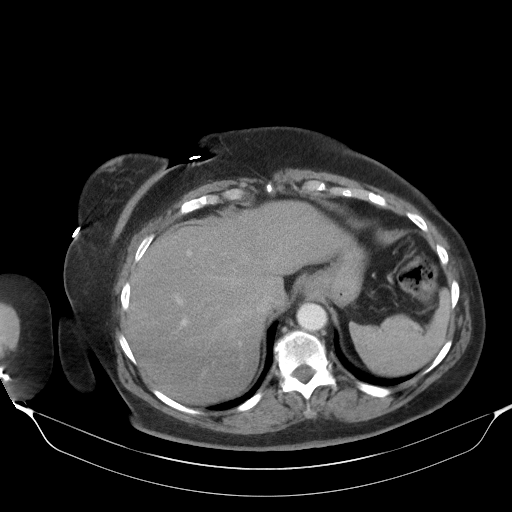
[im 85/99  lung]
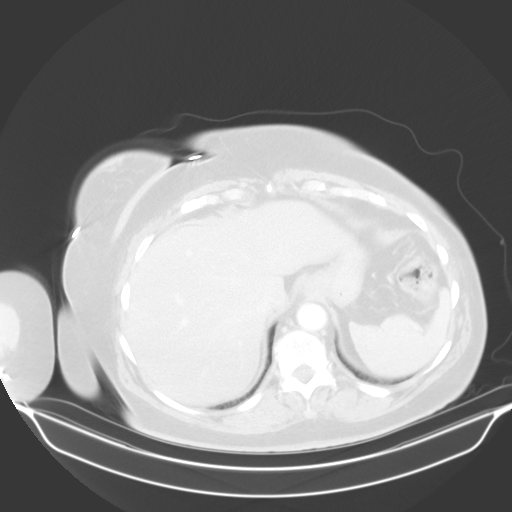
[im 92/99  soft-tissue]
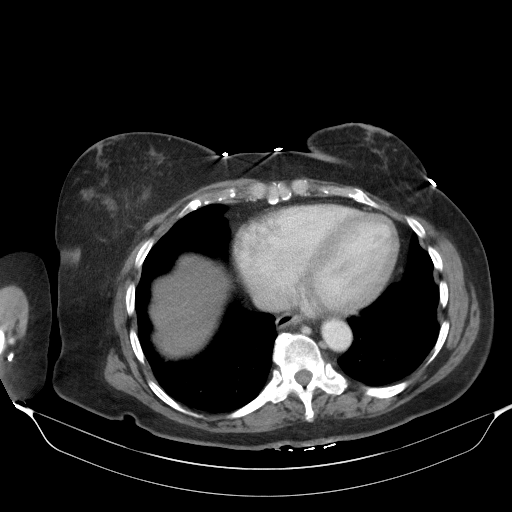
[im 92/99  lung]
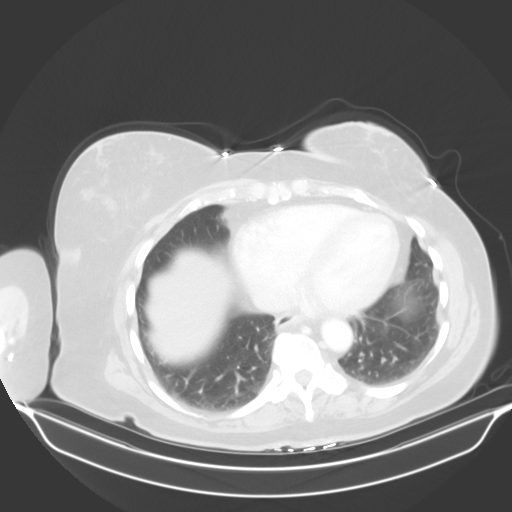

[13 of 32 positions shown; findings below may reference images not displayed]

FINDINGS: Lower Chest: No acute findings.

Hepatobiliary: No hepatic masses identified. Mild hepatic steatosis.
Prior cholecystectomy. No evidence of biliary obstruction.

Pancreas:  No mass or inflammatory changes.

Spleen: Within normal limits in size and appearance.

Adrenals/Urinary Tract: No masses identified. No evidence of
hydronephrosis. Unremarkable unopacified urinary bladder.

Stomach/Bowel: No evidence of obstruction, inflammatory process or
abnormal fluid collections. Normal appendix visualized.

Vascular/Lymphatic: No pathologically enlarged lymph nodes. No
abdominal aortic aneurysm.

Reproductive: Prior hysterectomy noted. Adnexal regions are
unremarkable in appearance.

Other:  None.

Musculoskeletal:  No suspicious bone lesions identified.
IMPRESSION: No evidence of appendicitis or other acute findings.

Mild hepatic steatosis.

## 2019-02-26 MED ORDER — IOPAMIDOL (ISOVUE-300) INJECTION 61%
100.0000 mL | Freq: Once | INTRAVENOUS | Status: AC | PRN
  Administered 2019-02-26: 100 mL via INTRAVENOUS

## 2019-03-15 DIAGNOSIS — H35373 Puckering of macula, bilateral: Secondary | ICD-10-CM | POA: Diagnosis not present

## 2019-03-15 DIAGNOSIS — H35033 Hypertensive retinopathy, bilateral: Secondary | ICD-10-CM | POA: Diagnosis not present

## 2019-03-15 DIAGNOSIS — H59031 Cystoid macular edema following cataract surgery, right eye: Secondary | ICD-10-CM | POA: Diagnosis not present

## 2019-03-15 DIAGNOSIS — M0579 Rheumatoid arthritis with rheumatoid factor of multiple sites without organ or systems involvement: Secondary | ICD-10-CM | POA: Diagnosis not present

## 2019-03-15 DIAGNOSIS — H33312 Horseshoe tear of retina without detachment, left eye: Secondary | ICD-10-CM | POA: Diagnosis not present

## 2019-04-10 DIAGNOSIS — M349 Systemic sclerosis, unspecified: Secondary | ICD-10-CM | POA: Diagnosis not present

## 2019-04-10 DIAGNOSIS — K760 Fatty (change of) liver, not elsewhere classified: Secondary | ICD-10-CM | POA: Diagnosis not present

## 2019-04-10 DIAGNOSIS — G5620 Lesion of ulnar nerve, unspecified upper limb: Secondary | ICD-10-CM | POA: Diagnosis not present

## 2019-04-10 DIAGNOSIS — R202 Paresthesia of skin: Secondary | ICD-10-CM | POA: Diagnosis not present

## 2019-04-10 DIAGNOSIS — M1990 Unspecified osteoarthritis, unspecified site: Secondary | ICD-10-CM | POA: Diagnosis not present

## 2019-04-10 DIAGNOSIS — M7989 Other specified soft tissue disorders: Secondary | ICD-10-CM | POA: Diagnosis not present

## 2019-04-10 DIAGNOSIS — M0579 Rheumatoid arthritis with rheumatoid factor of multiple sites without organ or systems involvement: Secondary | ICD-10-CM | POA: Diagnosis not present

## 2019-04-10 DIAGNOSIS — M25511 Pain in right shoulder: Secondary | ICD-10-CM | POA: Diagnosis not present

## 2019-04-10 DIAGNOSIS — I700 Atherosclerosis of aorta: Secondary | ICD-10-CM | POA: Diagnosis not present

## 2019-04-10 DIAGNOSIS — R252 Cramp and spasm: Secondary | ICD-10-CM | POA: Diagnosis not present

## 2019-04-10 DIAGNOSIS — S4991XA Unspecified injury of right shoulder and upper arm, initial encounter: Secondary | ICD-10-CM | POA: Diagnosis not present

## 2019-04-10 DIAGNOSIS — M79643 Pain in unspecified hand: Secondary | ICD-10-CM | POA: Diagnosis not present

## 2019-04-10 DIAGNOSIS — Z79899 Other long term (current) drug therapy: Secondary | ICD-10-CM | POA: Diagnosis not present

## 2019-04-12 DIAGNOSIS — M0579 Rheumatoid arthritis with rheumatoid factor of multiple sites without organ or systems involvement: Secondary | ICD-10-CM | POA: Diagnosis not present

## 2019-04-17 DIAGNOSIS — E1165 Type 2 diabetes mellitus with hyperglycemia: Secondary | ICD-10-CM | POA: Diagnosis not present

## 2019-04-17 DIAGNOSIS — K760 Fatty (change of) liver, not elsewhere classified: Secondary | ICD-10-CM | POA: Diagnosis not present

## 2019-04-17 DIAGNOSIS — I10 Essential (primary) hypertension: Secondary | ICD-10-CM | POA: Diagnosis not present

## 2019-05-02 DIAGNOSIS — E1142 Type 2 diabetes mellitus with diabetic polyneuropathy: Secondary | ICD-10-CM | POA: Diagnosis not present

## 2019-05-02 DIAGNOSIS — E1165 Type 2 diabetes mellitus with hyperglycemia: Secondary | ICD-10-CM | POA: Diagnosis not present

## 2019-05-02 DIAGNOSIS — I10 Essential (primary) hypertension: Secondary | ICD-10-CM | POA: Diagnosis not present

## 2019-05-02 DIAGNOSIS — Z7189 Other specified counseling: Secondary | ICD-10-CM | POA: Diagnosis not present

## 2019-05-02 DIAGNOSIS — K760 Fatty (change of) liver, not elsewhere classified: Secondary | ICD-10-CM | POA: Diagnosis not present

## 2019-05-10 DIAGNOSIS — M0579 Rheumatoid arthritis with rheumatoid factor of multiple sites without organ or systems involvement: Secondary | ICD-10-CM | POA: Diagnosis not present

## 2019-05-14 DIAGNOSIS — Z79899 Other long term (current) drug therapy: Secondary | ICD-10-CM | POA: Diagnosis not present

## 2019-05-17 DIAGNOSIS — H59031 Cystoid macular edema following cataract surgery, right eye: Secondary | ICD-10-CM | POA: Diagnosis not present

## 2019-05-17 DIAGNOSIS — H35373 Puckering of macula, bilateral: Secondary | ICD-10-CM | POA: Diagnosis not present

## 2019-06-05 DIAGNOSIS — I10 Essential (primary) hypertension: Secondary | ICD-10-CM | POA: Diagnosis not present

## 2019-06-07 DIAGNOSIS — M0579 Rheumatoid arthritis with rheumatoid factor of multiple sites without organ or systems involvement: Secondary | ICD-10-CM | POA: Diagnosis not present

## 2019-06-27 DIAGNOSIS — I10 Essential (primary) hypertension: Secondary | ICD-10-CM | POA: Diagnosis not present

## 2019-06-27 DIAGNOSIS — M545 Low back pain: Secondary | ICD-10-CM | POA: Diagnosis not present

## 2019-06-27 DIAGNOSIS — E1165 Type 2 diabetes mellitus with hyperglycemia: Secondary | ICD-10-CM | POA: Diagnosis not present

## 2019-06-27 DIAGNOSIS — M5430 Sciatica, unspecified side: Secondary | ICD-10-CM | POA: Diagnosis not present

## 2019-07-04 DIAGNOSIS — Z23 Encounter for immunization: Secondary | ICD-10-CM | POA: Diagnosis not present

## 2019-07-04 DIAGNOSIS — I10 Essential (primary) hypertension: Secondary | ICD-10-CM | POA: Diagnosis not present

## 2019-07-05 DIAGNOSIS — M0579 Rheumatoid arthritis with rheumatoid factor of multiple sites without organ or systems involvement: Secondary | ICD-10-CM | POA: Diagnosis not present

## 2019-07-11 DIAGNOSIS — R252 Cramp and spasm: Secondary | ICD-10-CM | POA: Diagnosis not present

## 2019-07-11 DIAGNOSIS — K760 Fatty (change of) liver, not elsewhere classified: Secondary | ICD-10-CM | POA: Diagnosis not present

## 2019-07-11 DIAGNOSIS — M26622 Arthralgia of left temporomandibular joint: Secondary | ICD-10-CM | POA: Diagnosis not present

## 2019-07-11 DIAGNOSIS — M7989 Other specified soft tissue disorders: Secondary | ICD-10-CM | POA: Diagnosis not present

## 2019-07-11 DIAGNOSIS — M79643 Pain in unspecified hand: Secondary | ICD-10-CM | POA: Diagnosis not present

## 2019-07-11 DIAGNOSIS — G5620 Lesion of ulnar nerve, unspecified upper limb: Secondary | ICD-10-CM | POA: Diagnosis not present

## 2019-07-11 DIAGNOSIS — R202 Paresthesia of skin: Secondary | ICD-10-CM | POA: Diagnosis not present

## 2019-07-11 DIAGNOSIS — M349 Systemic sclerosis, unspecified: Secondary | ICD-10-CM | POA: Diagnosis not present

## 2019-07-11 DIAGNOSIS — I700 Atherosclerosis of aorta: Secondary | ICD-10-CM | POA: Diagnosis not present

## 2019-07-11 DIAGNOSIS — Z79899 Other long term (current) drug therapy: Secondary | ICD-10-CM | POA: Diagnosis not present

## 2019-07-11 DIAGNOSIS — M1990 Unspecified osteoarthritis, unspecified site: Secondary | ICD-10-CM | POA: Diagnosis not present

## 2019-07-11 DIAGNOSIS — M0579 Rheumatoid arthritis with rheumatoid factor of multiple sites without organ or systems involvement: Secondary | ICD-10-CM | POA: Diagnosis not present

## 2019-08-02 DIAGNOSIS — M0579 Rheumatoid arthritis with rheumatoid factor of multiple sites without organ or systems involvement: Secondary | ICD-10-CM | POA: Diagnosis not present

## 2019-08-10 ENCOUNTER — Other Ambulatory Visit: Payer: Self-pay | Admitting: Internal Medicine

## 2019-08-10 DIAGNOSIS — Z1231 Encounter for screening mammogram for malignant neoplasm of breast: Secondary | ICD-10-CM

## 2019-08-13 ENCOUNTER — Encounter: Payer: Self-pay | Admitting: *Deleted

## 2019-08-14 ENCOUNTER — Other Ambulatory Visit: Payer: Self-pay

## 2019-08-14 ENCOUNTER — Encounter: Payer: Self-pay | Admitting: Diagnostic Neuroimaging

## 2019-08-14 ENCOUNTER — Ambulatory Visit (INDEPENDENT_AMBULATORY_CARE_PROVIDER_SITE_OTHER): Payer: Medicare Other | Admitting: Diagnostic Neuroimaging

## 2019-08-14 VITALS — BP 133/74 | HR 87 | Temp 97.8°F | Ht 68.0 in | Wt 232.0 lb

## 2019-08-14 DIAGNOSIS — R531 Weakness: Secondary | ICD-10-CM | POA: Diagnosis not present

## 2019-08-14 DIAGNOSIS — R269 Unspecified abnormalities of gait and mobility: Secondary | ICD-10-CM

## 2019-08-14 DIAGNOSIS — G44039 Episodic paroxysmal hemicrania, not intractable: Secondary | ICD-10-CM | POA: Diagnosis not present

## 2019-08-14 MED ORDER — INDOMETHACIN ER 75 MG PO CPCR: 75.0000 mg | ORAL_CAPSULE | Freq: Every day | ORAL | 1 refills | Status: DC

## 2019-08-14 NOTE — Patient Instructions (Signed)
GAIT DIFFICULTY / DIZZINESS (due to diabetic neuropathy, pain, weakness, rheumatoid arthritis) - continue supportive care; pain mgmt; use cane / walker; follow up with PT - check CK, aldolase (myopathy eval)  LEFT SIDED HEADACHES (throbbing, photophobia, 1 hr, daily; chronic migraine vs paroxysmal hemicrania) - trial of indomethacin 75mg  daily
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
## 2019-08-15 ENCOUNTER — Telehealth: Payer: Self-pay | Admitting: *Deleted

## 2019-08-15 LAB — ALDOLASE: Aldolase: 4 U/L (ref 3.3–10.3)

## 2019-08-15 LAB — CK: Total CK: 75 U/L (ref 32–182)

## 2019-08-15 NOTE — Telephone Encounter (Signed)
Started PA for Indomethacin on CMM, key: [id number], dx: G44.039. Information sent to [facility].

## 2019-08-15 NOTE — Telephone Encounter (Signed)
Spoke with patient and informed her that her labs are normal. She verbalized understanding, appreciation.

## 2019-08-15 NOTE — Telephone Encounter (Signed)
Per CMM, Indomethacin approved, Case [id number], Coverage Start Date:07/16/2019;Coverage End Date:08/14/2020. Approval faxed to [facility], [place].

## 2019-08-21 DIAGNOSIS — M26622 Arthralgia of left temporomandibular joint: Secondary | ICD-10-CM | POA: Diagnosis not present

## 2019-08-21 DIAGNOSIS — R202 Paresthesia of skin: Secondary | ICD-10-CM | POA: Diagnosis not present

## 2019-08-21 DIAGNOSIS — I700 Atherosclerosis of aorta: Secondary | ICD-10-CM | POA: Diagnosis not present

## 2019-08-21 DIAGNOSIS — Z79899 Other long term (current) drug therapy: Secondary | ICD-10-CM | POA: Diagnosis not present

## 2019-08-21 DIAGNOSIS — M349 Systemic sclerosis, unspecified: Secondary | ICD-10-CM | POA: Diagnosis not present

## 2019-08-21 DIAGNOSIS — M1990 Unspecified osteoarthritis, unspecified site: Secondary | ICD-10-CM | POA: Diagnosis not present

## 2019-08-21 DIAGNOSIS — G5620 Lesion of ulnar nerve, unspecified upper limb: Secondary | ICD-10-CM | POA: Diagnosis not present

## 2019-08-21 DIAGNOSIS — M0579 Rheumatoid arthritis with rheumatoid factor of multiple sites without organ or systems involvement: Secondary | ICD-10-CM | POA: Diagnosis not present

## 2019-08-21 DIAGNOSIS — R252 Cramp and spasm: Secondary | ICD-10-CM | POA: Diagnosis not present

## 2019-08-21 DIAGNOSIS — K760 Fatty (change of) liver, not elsewhere classified: Secondary | ICD-10-CM | POA: Diagnosis not present

## 2019-08-21 DIAGNOSIS — M7989 Other specified soft tissue disorders: Secondary | ICD-10-CM | POA: Diagnosis not present

## 2019-08-21 DIAGNOSIS — M79643 Pain in unspecified hand: Secondary | ICD-10-CM | POA: Diagnosis not present

## 2019-08-30 DIAGNOSIS — K760 Fatty (change of) liver, not elsewhere classified: Secondary | ICD-10-CM | POA: Diagnosis not present

## 2019-08-30 DIAGNOSIS — E1142 Type 2 diabetes mellitus with diabetic polyneuropathy: Secondary | ICD-10-CM | POA: Diagnosis not present

## 2019-08-30 DIAGNOSIS — M0579 Rheumatoid arthritis with rheumatoid factor of multiple sites without organ or systems involvement: Secondary | ICD-10-CM | POA: Diagnosis not present

## 2019-08-30 DIAGNOSIS — E1165 Type 2 diabetes mellitus with hyperglycemia: Secondary | ICD-10-CM | POA: Diagnosis not present

## 2019-08-30 DIAGNOSIS — I10 Essential (primary) hypertension: Secondary | ICD-10-CM | POA: Diagnosis not present

## 2019-09-03 DIAGNOSIS — H33312 Horseshoe tear of retina without detachment, left eye: Secondary | ICD-10-CM | POA: Diagnosis not present

## 2019-09-03 DIAGNOSIS — H43393 Other vitreous opacities, bilateral: Secondary | ICD-10-CM | POA: Diagnosis not present

## 2019-09-03 DIAGNOSIS — H43813 Vitreous degeneration, bilateral: Secondary | ICD-10-CM | POA: Diagnosis not present

## 2019-09-03 DIAGNOSIS — H35373 Puckering of macula, bilateral: Secondary | ICD-10-CM | POA: Diagnosis not present

## 2019-09-05 DIAGNOSIS — Z7189 Other specified counseling: Secondary | ICD-10-CM | POA: Diagnosis not present

## 2019-09-05 DIAGNOSIS — I700 Atherosclerosis of aorta: Secondary | ICD-10-CM | POA: Diagnosis not present

## 2019-09-05 DIAGNOSIS — E1165 Type 2 diabetes mellitus with hyperglycemia: Secondary | ICD-10-CM | POA: Diagnosis not present

## 2019-09-05 DIAGNOSIS — I10 Essential (primary) hypertension: Secondary | ICD-10-CM | POA: Diagnosis not present

## 2019-09-05 DIAGNOSIS — I872 Venous insufficiency (chronic) (peripheral): Secondary | ICD-10-CM | POA: Diagnosis not present

## 2019-09-05 DIAGNOSIS — M341 CR(E)ST syndrome: Secondary | ICD-10-CM | POA: Diagnosis not present

## 2019-09-05 DIAGNOSIS — K760 Fatty (change of) liver, not elsewhere classified: Secondary | ICD-10-CM | POA: Diagnosis not present

## 2019-09-05 DIAGNOSIS — E1142 Type 2 diabetes mellitus with diabetic polyneuropathy: Secondary | ICD-10-CM | POA: Diagnosis not present

## 2019-09-05 DIAGNOSIS — Z0000 Encounter for general adult medical examination without abnormal findings: Secondary | ICD-10-CM | POA: Diagnosis not present

## 2019-09-05 DIAGNOSIS — M0579 Rheumatoid arthritis with rheumatoid factor of multiple sites without organ or systems involvement: Secondary | ICD-10-CM | POA: Diagnosis not present

## 2019-09-25 ENCOUNTER — Ambulatory Visit
Admission: RE | Admit: 2019-09-25 | Discharge: 2019-09-25 | Disposition: A | Discharge status: Home / Self Care | Payer: Medicare Other | Source: Ambulatory Visit | Attending: Internal Medicine | Admitting: Internal Medicine

## 2019-09-25 ENCOUNTER — Other Ambulatory Visit: Payer: Self-pay

## 2019-09-25 DIAGNOSIS — Z1231 Encounter for screening mammogram for malignant neoplasm of breast: Secondary | ICD-10-CM | POA: Diagnosis not present

## 2019-09-25 IMAGING — MG DIGITAL SCREENING BILAT W/ TOMO W/ CAD
8 series · 8 of 24 positions shown · non-contrast
Comparison: Previous exam(s).

CLINICAL DATA: Screening. Please note, sub optimal positioning due
to patient shoulder pain; the best possible images were obtained.

EXAM:
DIGITAL SCREENING BILATERAL MAMMOGRAM WITH TOMO AND CAD

[L CC synth-2D]
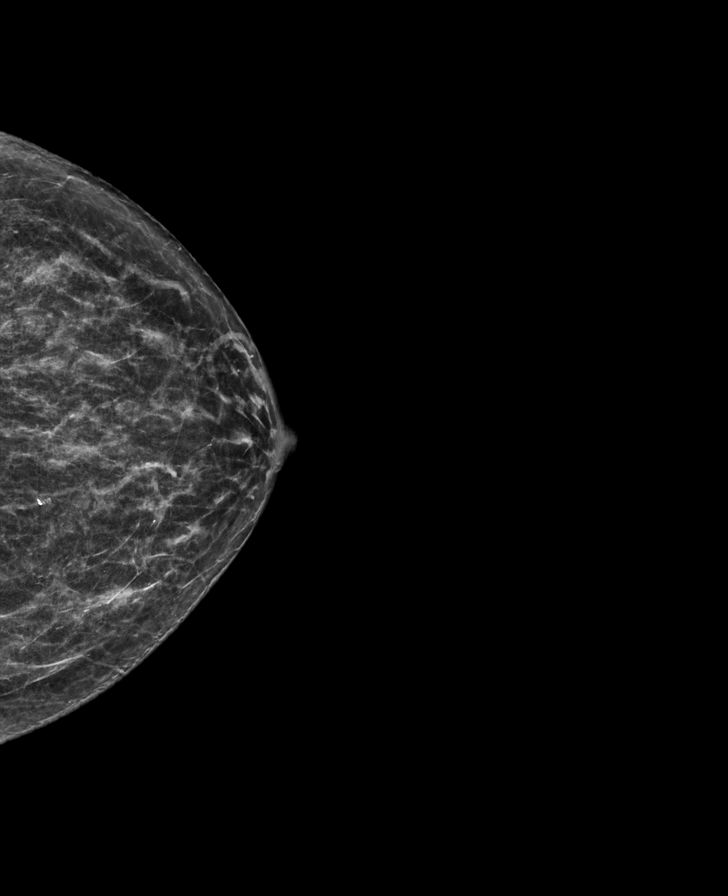

[R MLO synth-2D]
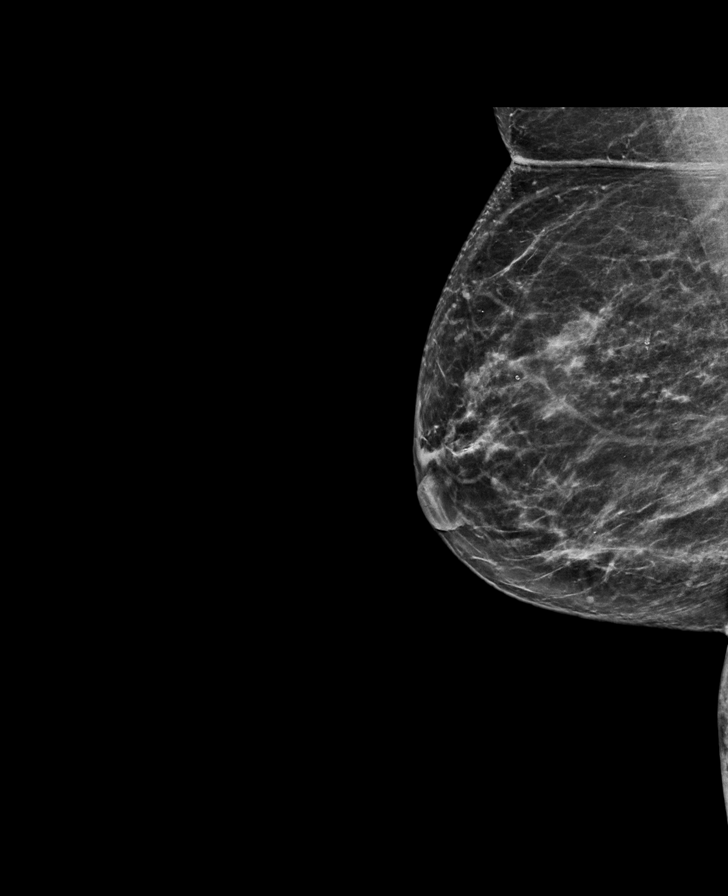

[R CC synth-2D]
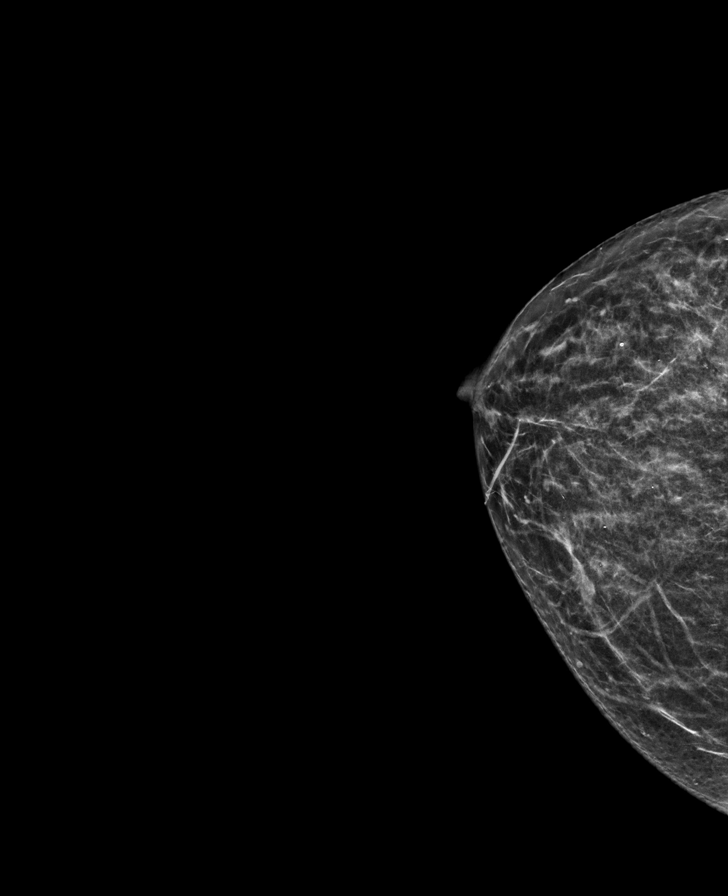

[L MLO synth-2D]
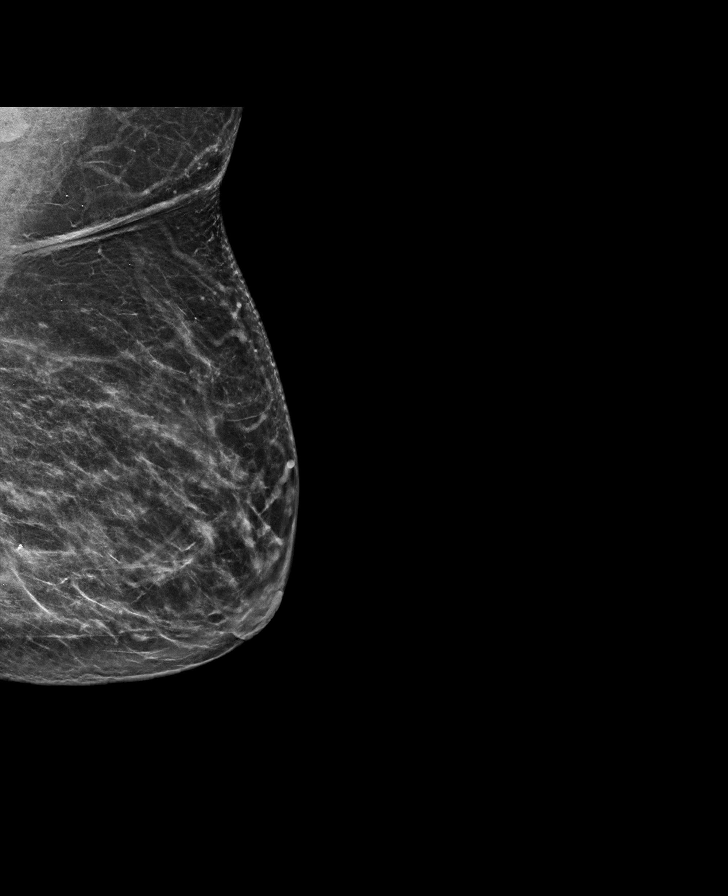

[R CC tomo · tomo slice 28/55.0]
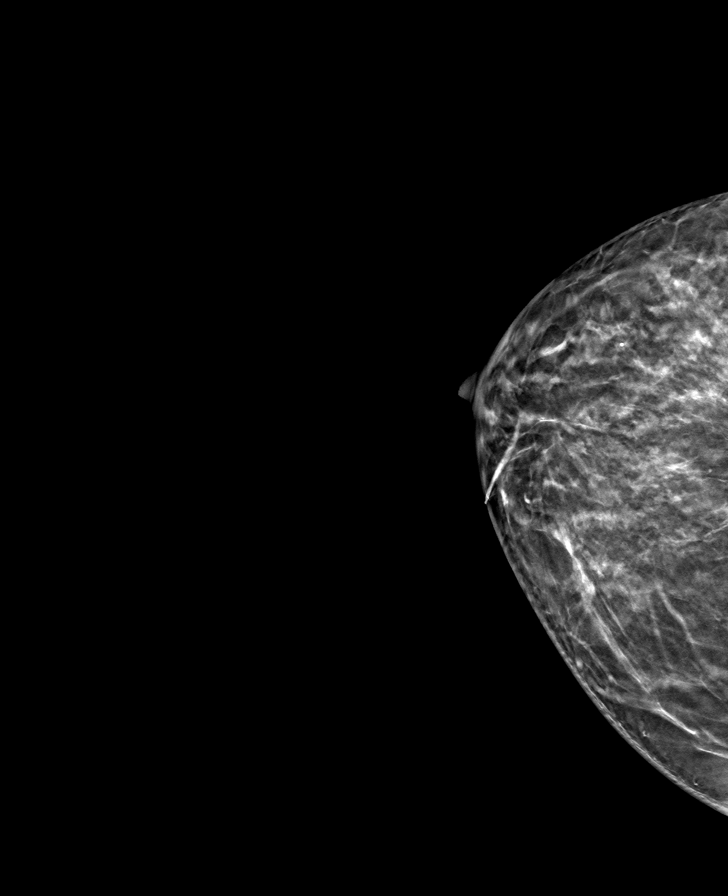

[L MLO tomo · tomo slice 39/76.0]
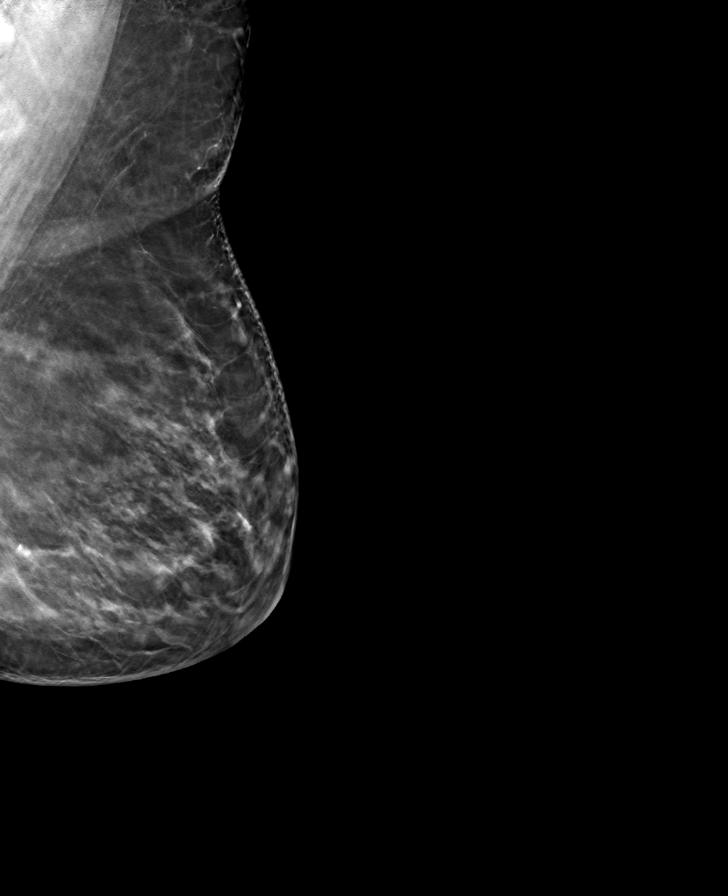

[R MLO tomo · tomo slice 37/73.0]
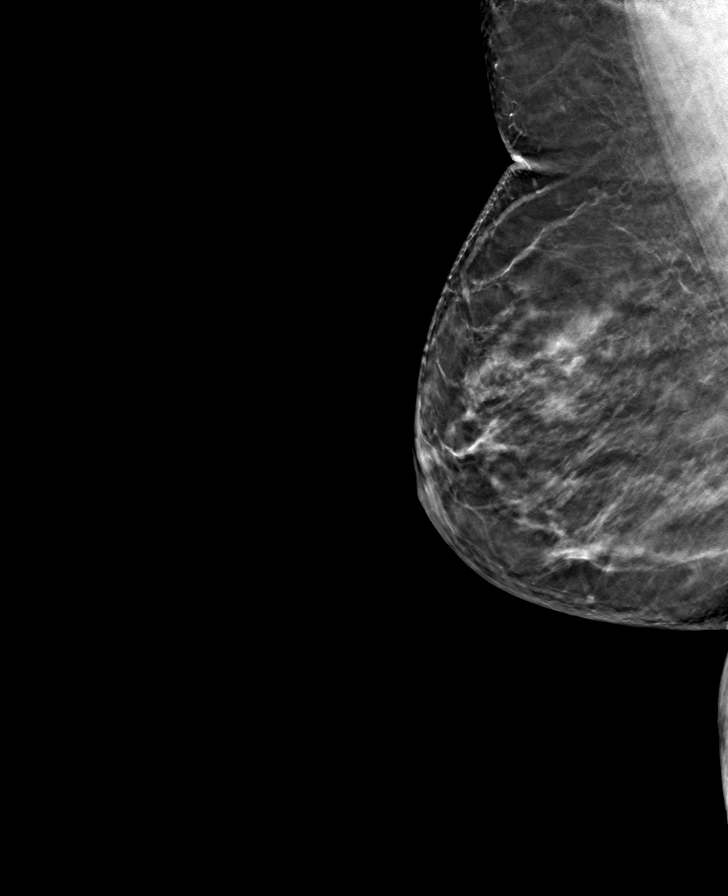

[L CC tomo · tomo slice 29/57.0]
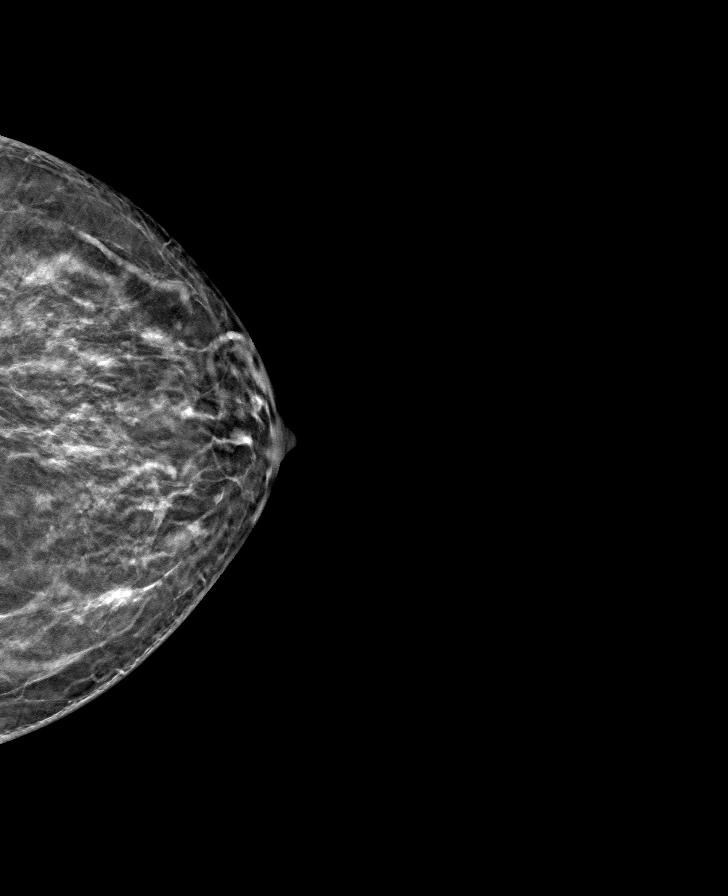

[8 of 24 positions shown; findings below may reference images not displayed]

ACR Breast Density Category c: The breast tissue is heterogeneously
dense, which may obscure small masses.
FINDINGS: There are no findings suspicious for malignancy. Images were
processed with CAD.
IMPRESSION: No mammographic evidence of malignancy. A result letter of this
screening mammogram will be mailed directly to the patient.

RECOMMENDATION:
Screening mammogram in one year. (Code:SU-6-GWB)

BI-RADS CATEGORY  1: Negative.

## 2019-12-09 ENCOUNTER — Emergency Department (HOSPITAL_BASED_OUTPATIENT_CLINIC_OR_DEPARTMENT_OTHER): Payer: Medicare HMO

## 2019-12-09 ENCOUNTER — Encounter (HOSPITAL_BASED_OUTPATIENT_CLINIC_OR_DEPARTMENT_OTHER): Payer: Self-pay | Admitting: *Deleted

## 2019-12-09 ENCOUNTER — Emergency Department (HOSPITAL_BASED_OUTPATIENT_CLINIC_OR_DEPARTMENT_OTHER)
Admission: EM | Admit: 2019-12-09 | Discharge: 2019-12-10 | Disposition: A | Discharge status: Home / Self Care | Payer: Medicare HMO | Attending: Emergency Medicine | Admitting: Emergency Medicine

## 2019-12-09 ENCOUNTER — Other Ambulatory Visit: Payer: Self-pay

## 2019-12-09 DIAGNOSIS — I10 Essential (primary) hypertension: Secondary | ICD-10-CM | POA: Insufficient documentation

## 2019-12-09 DIAGNOSIS — B372 Candidiasis of skin and nail: Secondary | ICD-10-CM | POA: Insufficient documentation

## 2019-12-09 DIAGNOSIS — R109 Unspecified abdominal pain: Secondary | ICD-10-CM | POA: Diagnosis not present

## 2019-12-09 DIAGNOSIS — Z7984 Long term (current) use of oral hypoglycemic drugs: Secondary | ICD-10-CM | POA: Diagnosis not present

## 2019-12-09 DIAGNOSIS — Z7982 Long term (current) use of aspirin: Secondary | ICD-10-CM | POA: Diagnosis not present

## 2019-12-09 DIAGNOSIS — E1140 Type 2 diabetes mellitus with diabetic neuropathy, unspecified: Secondary | ICD-10-CM | POA: Insufficient documentation

## 2019-12-09 DIAGNOSIS — Z79899 Other long term (current) drug therapy: Secondary | ICD-10-CM | POA: Insufficient documentation

## 2019-12-09 DIAGNOSIS — R1031 Right lower quadrant pain: Secondary | ICD-10-CM | POA: Insufficient documentation

## 2019-12-09 DIAGNOSIS — B379 Candidiasis, unspecified: Secondary | ICD-10-CM | POA: Diagnosis not present

## 2019-12-09 DIAGNOSIS — M79606 Pain in leg, unspecified: Secondary | ICD-10-CM | POA: Diagnosis present

## 2019-12-09 LAB — CBC WITH DIFFERENTIAL/PLATELET
Abs Immature Granulocytes: 0.02 10*3/uL (ref 0.00–0.07)
Basophils Absolute: 0 10*3/uL (ref 0.0–0.1)
Basophils Relative: 0 %
Eosinophils Absolute: 0 10*3/uL (ref 0.0–0.5)
Eosinophils Relative: 0 %
HCT: 39.1 % (ref 36.0–46.0)
Hemoglobin: 12.3 g/dL (ref 12.0–15.0)
Immature Granulocytes: 0 %
Lymphocytes Relative: 42 %
Lymphs Abs: 3.1 10*3/uL (ref 0.7–4.0)
MCH: 28.5 pg (ref 26.0–34.0)
MCHC: 31.5 g/dL (ref 30.0–36.0)
MCV: 90.5 fL (ref 80.0–100.0)
Monocytes Absolute: 0.7 10*3/uL (ref 0.1–1.0)
Monocytes Relative: 9 %
Neutro Abs: 3.5 10*3/uL (ref 1.7–7.7)
Neutrophils Relative %: 49 %
Platelets: 255 10*3/uL (ref 150–400)
RBC: 4.32 MIL/uL (ref 3.87–5.11)
RDW: 19.5 % — ABNORMAL HIGH (ref 11.5–15.5)
WBC: 7.3 10*3/uL (ref 4.0–10.5)
nRBC: 0 % (ref 0.0–0.2)

## 2019-12-09 LAB — COMPREHENSIVE METABOLIC PANEL
ALT: 20 U/L (ref 0–44)
AST: 20 U/L (ref 15–41)
Albumin: 3.6 g/dL (ref 3.5–5.0)
Alkaline Phosphatase: 57 U/L (ref 38–126)
Anion gap: 9 (ref 5–15)
BUN: 11 mg/dL (ref 8–23)
CO2: 26 mmol/L (ref 22–32)
Calcium: 8.8 mg/dL — ABNORMAL LOW (ref 8.9–10.3)
Chloride: 106 mmol/L (ref 98–111)
Creatinine, Ser: 0.55 mg/dL (ref 0.44–1.00)
GFR calc Af Amer: >60 mL/min (ref >60)
GFR calc non Af Amer: >60 mL/min (ref >60)
Glucose, Bld: 126 mg/dL — ABNORMAL HIGH (ref 70–99)
Potassium: 3.5 mmol/L (ref 3.5–5.1)
Sodium: 141 mmol/L (ref 135–145)
Total Bilirubin: 0.5 mg/dL (ref 0.3–1.2)
Total Protein: 7.2 g/dL (ref 6.5–8.1)

## 2019-12-09 LAB — URINALYSIS, ROUTINE W REFLEX MICROSCOPIC
Bilirubin Urine: NEGATIVE
Glucose, UA: NEGATIVE mg/dL
Hgb urine dipstick: NEGATIVE
Ketones, ur: NEGATIVE mg/dL
Leukocytes,Ua: NEGATIVE
Nitrite: NEGATIVE
Protein, ur: NEGATIVE mg/dL
Specific Gravity, Urine: 1.02 (ref 1.005–1.030)
pH: 6.5 (ref 5.0–8.0)

## 2019-12-09 IMAGING — CT CT ABD-PELV W/ CM
2 of 5 series · 17 of 46 positions shown, 19 images · IV contrast (Omnipaque)
Comparison: 02/26/2019

CLINICAL DATA: Right lower quadrant abdominal pain. Suspected
appendicitis.

EXAM:
CT ABDOMEN AND PELVIS WITH CONTRAST
TECHNIQUE: Multidetector CT imaging of the abdomen and pelvis was performed
using the standard protocol following bolus administration of
intravenous contrast.
CONTRAST:  100mL OMNIPAQUE IOHEXOL 300 MG/ML  SOLN

[Series 2: axial st · axial · 0.86mm/px · z∈[+682,+1107]mm · 14 of 95 slices shown, 16 images]
[im 5/95  soft-tissue]
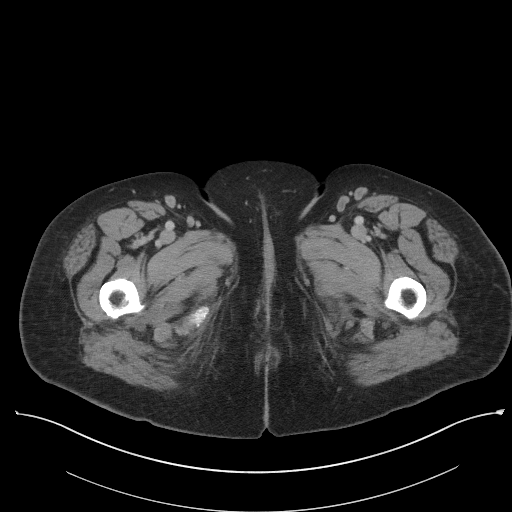
[im 5/95  bone]
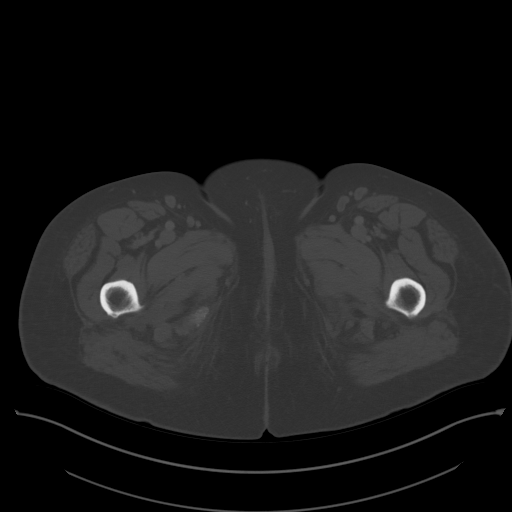
[im 14/95  soft-tissue]
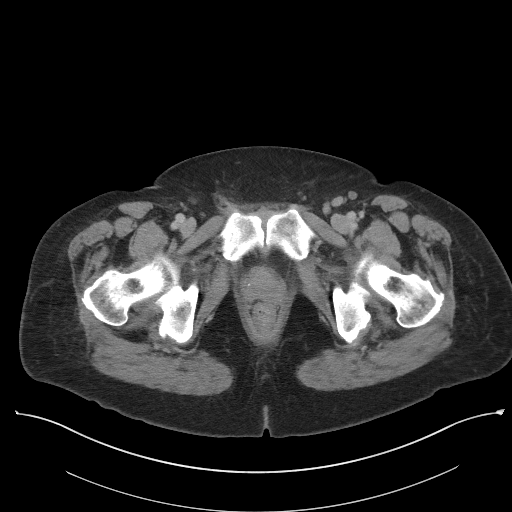
[im 18/95  soft-tissue]
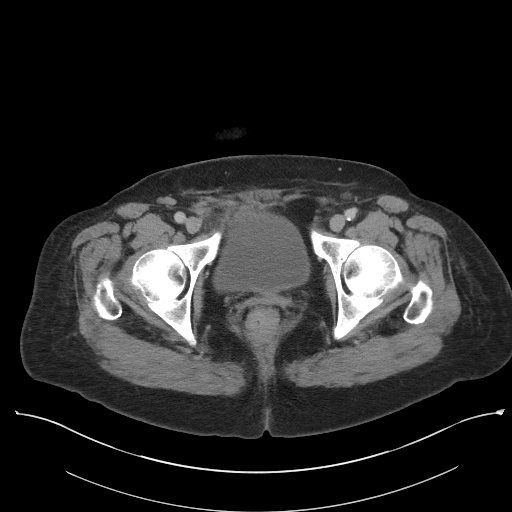
[im 27/95  soft-tissue]
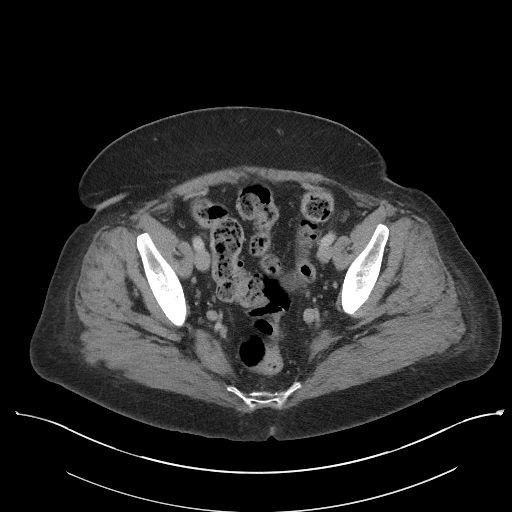
[im 32/95  soft-tissue]
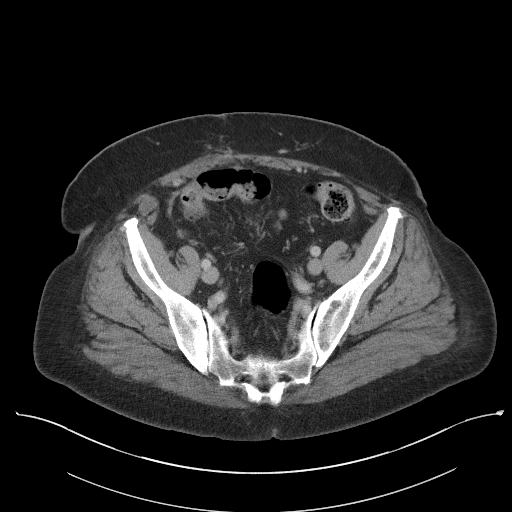
[im 36/95  soft-tissue]
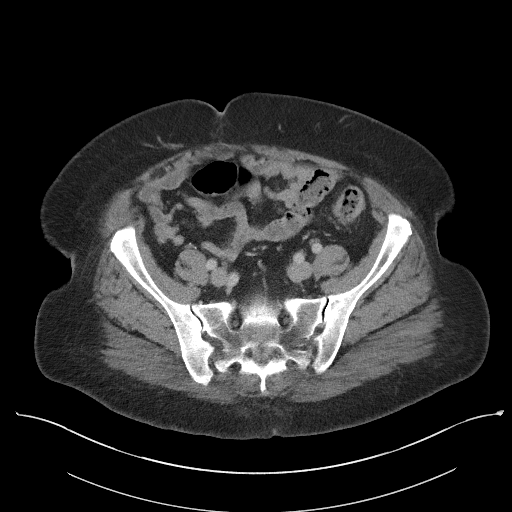
[im 45/95  soft-tissue]
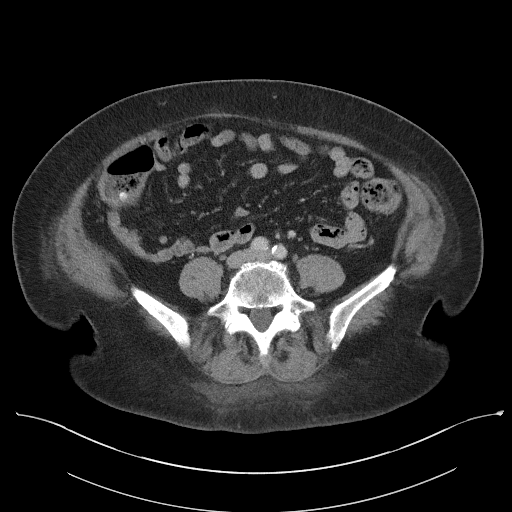
[im 50/95  soft-tissue]
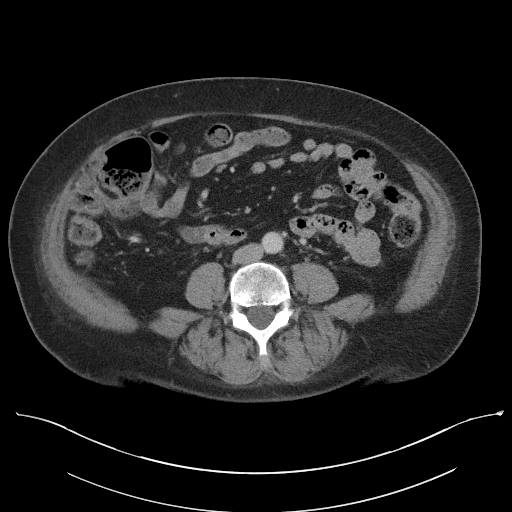
[im 59/95  soft-tissue]
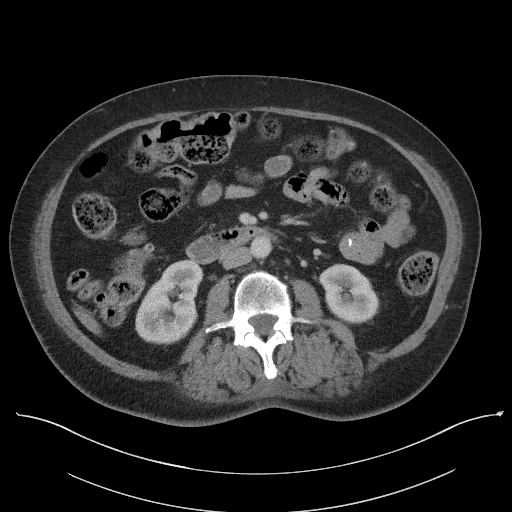
[im 59/95  bone]
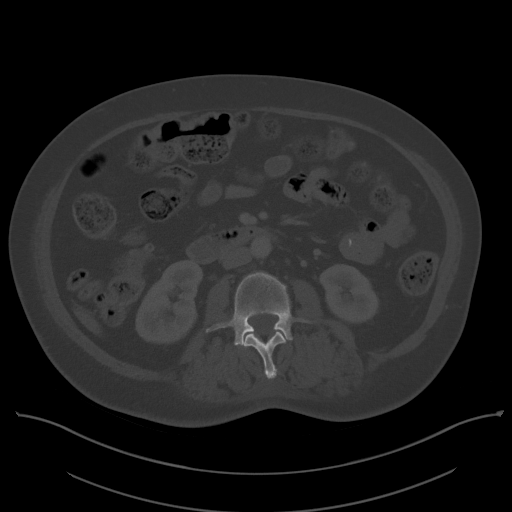
[im 63/95  soft-tissue]
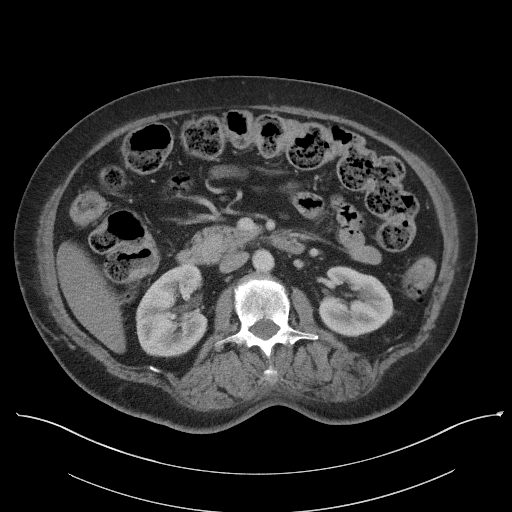
[im 72/95  soft-tissue]
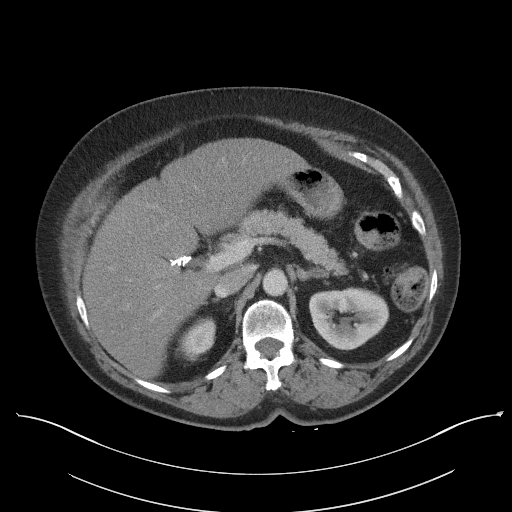
[im 77/95  soft-tissue]
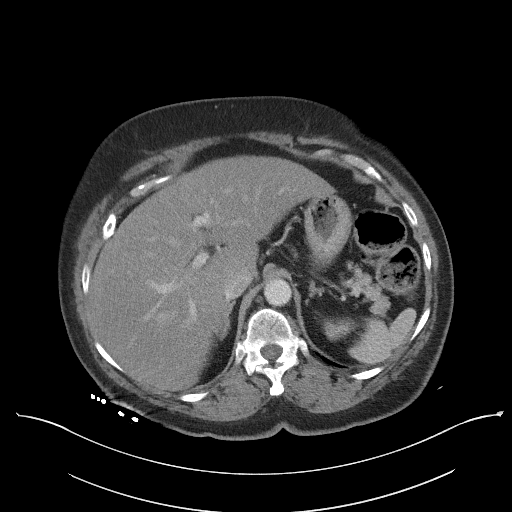
[im 81/95  soft-tissue]
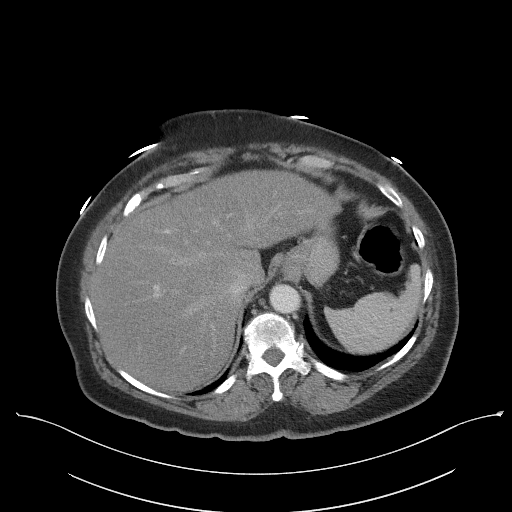
[im 90/95  soft-tissue]
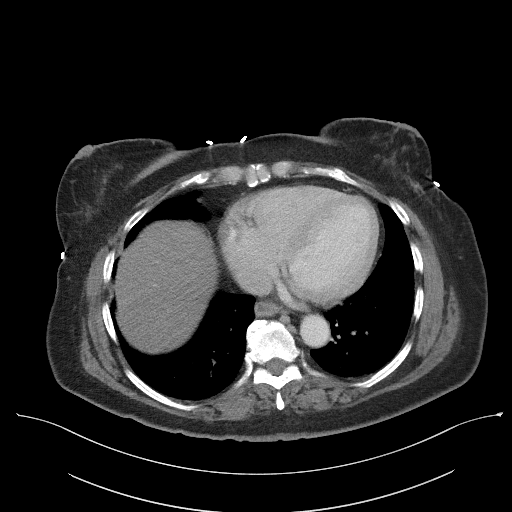

[Series 5: coronal st · coronal · 0.90mm/px · 3 of 104 slices shown]
[im 35/104  soft-tissue]
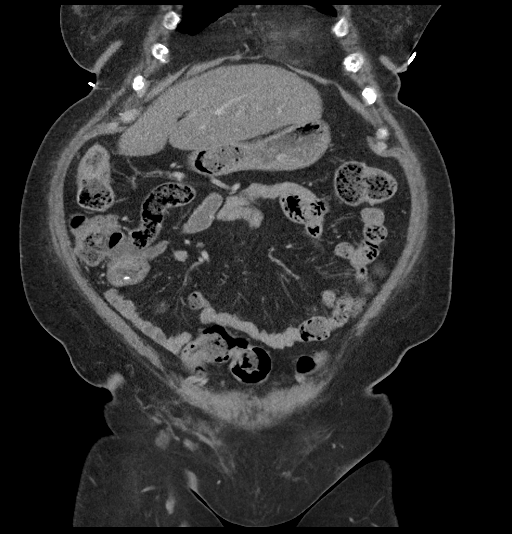
[im 46/104  soft-tissue]
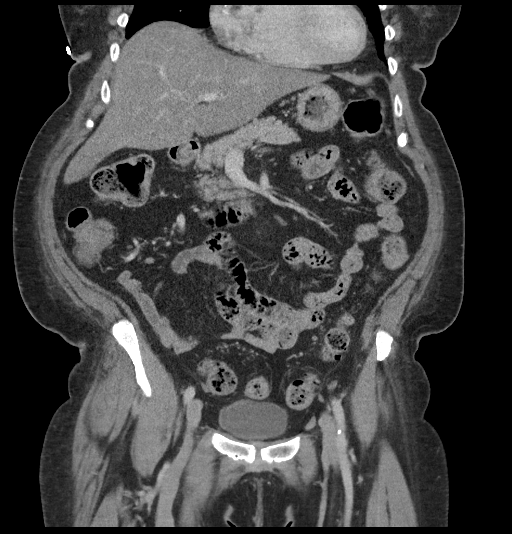
[im 58/104  soft-tissue]
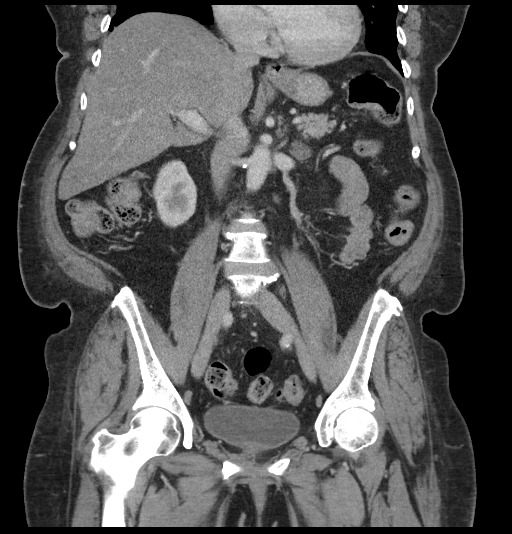

[17 of 46 positions shown; findings below may reference images not displayed]

FINDINGS: Lower chest: Lung bases are clear.  No pleural or pericardial fluid.

Hepatobiliary: Fatty change of the liver as seen previously.
Previous cholecystectomy.

Pancreas: Normal

Spleen: Normal

Adrenals/Urinary Tract: Adrenal glands are normal. Kidneys are
normal. No cyst, mass, stone or hydronephrosis. Bladder is normal.

Stomach/Bowel: Moderate amount of stool and gas in the colon. Fecal
appearance of contents of the distal small intestine suggests
constipation. The appendix is normal. No evidence of diverticulitis.

Vascular/Lymphatic: Aortic atherosclerosis. No aneurysm. IVC is
normal. No retroperitoneal adenopathy.

Reproductive: Previous hysterectomy.  No pelvic mass.

Other: No free fluid or air.

Musculoskeletal: Normal
IMPRESSION: Fatty liver, which can be painful.  Previous cholecystectomy.

Fairly large amount of gas and fecal matter in the colon. Fecal type
material in the distal small bowel. This suggests delayed transit
and can be seen with constipation.

Normal appearing appendix.  No evidence of diverticulitis.

## 2019-12-09 MED ORDER — IOHEXOL 300 MG/ML  SOLN
100.0000 mL | Freq: Once | INTRAMUSCULAR | Status: AC | PRN
  Administered 2019-12-09: 100 mL via INTRAVENOUS
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
## 2019-12-09 NOTE — ED Notes (Signed)
ED Provider at bedside. 

## 2019-12-09 NOTE — ED Notes (Signed)
Taken to CT at this time. 

## 2019-12-09 NOTE — ED Triage Notes (Addendum)
Pt reports right leg cramp since yesterday. Points to groin for location of pain

## 2019-12-10 MED ORDER — NYSTATIN 100000 UNIT/GM EX CREA: TOPICAL_CREAM | CUTANEOUS | 0 refills | Status: DC

## 2019-12-10 MED ORDER — KETOROLAC TROMETHAMINE 15 MG/ML IJ SOLN
15.0000 mg | Freq: Once | INTRAMUSCULAR | Status: AC
  Administered 2019-12-10: 15 mg via INTRAVENOUS
  Filled 2019-12-10: qty 1

## 2019-12-10 MED ORDER — ACETAMINOPHEN 500 MG PO TABS: 500.0000 mg | ORAL_TABLET | Freq: Four times a day (QID) | ORAL | 0 refills | Status: DC | PRN

## 2019-12-10 NOTE — Discharge Instructions (Addendum)
You were seen today for right groin pain.  Your CT scan is reassuring.  You may have some constipation but this is not consistent with where your pain is.  You do have a yeast infection in the fold of your right groin.  Apply cream as instructed.  Take Tylenol as needed for pain.  If you have any ongoing or worsening symptoms you need to be reevaluated.

## 2019-12-10 NOTE — ED Provider Notes (Signed)
Patient signed out pending CT scan.  In brief, patient presented with right groin and right lower quadrant pain.  Lab work largely reassuring.  CT scan obtained to rule appendicitis or other etiology.  CT scan reviewed and is largely reassuring.  She does have some evidence of stool in her colon which could be seen with constipation.  She also has a fatty liver but this is not consistent with location of pain.  On my evaluation, patient is truly tender in the right groin and right mons pubis area.  No overlying skin changes, crepitus.  She does have some evidence of yeast infection in the inguinal fold.  No significant inguinal adenopathy.  She remains nontoxic and her vital signs are reassuring.  She is able to tolerate fluids.  She reports that she does not have any functional symptoms of constipation with a normal bowel movement earlier today.  Recommend supportive measures.  Will treat yeast infection and recommend Tylenol for pain and discomfort.  She was given strict return precautions  After history, exam, and medical workup I feel the patient has been appropriately medically screened and is safe for discharge home. Pertinent diagnoses were discussed with the patient. Patient was given return precautions.    Merryl Hacker, MD 12/10/19 [phone]

## 2019-12-11 DIAGNOSIS — R69 Illness, unspecified: Secondary | ICD-10-CM | POA: Diagnosis not present

## 2019-12-24 DIAGNOSIS — M0579 Rheumatoid arthritis with rheumatoid factor of multiple sites without organ or systems involvement: Secondary | ICD-10-CM | POA: Diagnosis not present

## 2019-12-24 DIAGNOSIS — M79643 Pain in unspecified hand: Secondary | ICD-10-CM | POA: Diagnosis not present

## 2019-12-24 DIAGNOSIS — M7989 Other specified soft tissue disorders: Secondary | ICD-10-CM | POA: Diagnosis not present

## 2019-12-24 DIAGNOSIS — Z79899 Other long term (current) drug therapy: Secondary | ICD-10-CM | POA: Diagnosis not present

## 2019-12-24 DIAGNOSIS — M1990 Unspecified osteoarthritis, unspecified site: Secondary | ICD-10-CM | POA: Diagnosis not present

## 2019-12-24 DIAGNOSIS — R252 Cramp and spasm: Secondary | ICD-10-CM | POA: Diagnosis not present

## 2019-12-24 DIAGNOSIS — M349 Systemic sclerosis, unspecified: Secondary | ICD-10-CM | POA: Diagnosis not present

## 2019-12-24 DIAGNOSIS — K760 Fatty (change of) liver, not elsewhere classified: Secondary | ICD-10-CM | POA: Diagnosis not present

## 2020-01-03 DIAGNOSIS — R69 Illness, unspecified: Secondary | ICD-10-CM | POA: Diagnosis not present

## 2020-01-23 DIAGNOSIS — I10 Essential (primary) hypertension: Secondary | ICD-10-CM | POA: Diagnosis not present

## 2020-01-23 DIAGNOSIS — E1142 Type 2 diabetes mellitus with diabetic polyneuropathy: Secondary | ICD-10-CM | POA: Diagnosis not present

## 2020-01-23 DIAGNOSIS — E1165 Type 2 diabetes mellitus with hyperglycemia: Secondary | ICD-10-CM | POA: Diagnosis not present

## 2020-01-24 DIAGNOSIS — Z0100 Encounter for examination of eyes and vision without abnormal findings: Secondary | ICD-10-CM | POA: Diagnosis not present

## 2020-01-30 DIAGNOSIS — I10 Essential (primary) hypertension: Secondary | ICD-10-CM | POA: Diagnosis not present

## 2020-01-30 DIAGNOSIS — R1084 Generalized abdominal pain: Secondary | ICD-10-CM | POA: Diagnosis not present

## 2020-01-30 DIAGNOSIS — E1165 Type 2 diabetes mellitus with hyperglycemia: Secondary | ICD-10-CM | POA: Diagnosis not present

## 2020-01-30 DIAGNOSIS — R519 Headache, unspecified: Secondary | ICD-10-CM | POA: Diagnosis not present

## 2020-02-04 DIAGNOSIS — K760 Fatty (change of) liver, not elsewhere classified: Secondary | ICD-10-CM | POA: Diagnosis not present

## 2020-02-04 DIAGNOSIS — R252 Cramp and spasm: Secondary | ICD-10-CM | POA: Diagnosis not present

## 2020-02-04 DIAGNOSIS — M0579 Rheumatoid arthritis with rheumatoid factor of multiple sites without organ or systems involvement: Secondary | ICD-10-CM | POA: Diagnosis not present

## 2020-02-04 DIAGNOSIS — L940 Localized scleroderma [morphea]: Secondary | ICD-10-CM | POA: Diagnosis not present

## 2020-02-04 DIAGNOSIS — M79643 Pain in unspecified hand: Secondary | ICD-10-CM | POA: Diagnosis not present

## 2020-02-04 DIAGNOSIS — M7989 Other specified soft tissue disorders: Secondary | ICD-10-CM | POA: Diagnosis not present

## 2020-02-04 DIAGNOSIS — M349 Systemic sclerosis, unspecified: Secondary | ICD-10-CM | POA: Diagnosis not present

## 2020-02-04 DIAGNOSIS — M1990 Unspecified osteoarthritis, unspecified site: Secondary | ICD-10-CM | POA: Diagnosis not present

## 2020-02-04 DIAGNOSIS — Z79899 Other long term (current) drug therapy: Secondary | ICD-10-CM | POA: Diagnosis not present

## 2020-02-16 ENCOUNTER — Other Ambulatory Visit (HOSPITAL_COMMUNITY)
Admission: RE | Admit: 2020-02-16 | Discharge: 2020-02-16 | Disposition: A | Discharge status: Home / Self Care | Payer: Medicare HMO | Source: Ambulatory Visit | Attending: Family Medicine | Admitting: Family Medicine

## 2020-02-16 DIAGNOSIS — Z01812 Encounter for preprocedural laboratory examination: Secondary | ICD-10-CM | POA: Diagnosis not present

## 2020-02-16 DIAGNOSIS — Z20822 Contact with and (suspected) exposure to covid-19: Secondary | ICD-10-CM | POA: Diagnosis not present

## 2020-02-16 LAB — SARS CORONAVIRUS 2 (TAT 6-24 HRS): SARS Coronavirus 2: NEGATIVE

## 2020-02-18 ENCOUNTER — Other Ambulatory Visit: Payer: Self-pay | Admitting: *Deleted

## 2020-02-18 DIAGNOSIS — R0609 Other forms of dyspnea: Secondary | ICD-10-CM

## 2020-02-19 DIAGNOSIS — Z049 Encounter for examination and observation for unspecified reason: Secondary | ICD-10-CM | POA: Diagnosis not present

## 2020-02-19 DIAGNOSIS — R519 Headache, unspecified: Secondary | ICD-10-CM | POA: Diagnosis not present

## 2020-02-21 DIAGNOSIS — M349 Systemic sclerosis, unspecified: Secondary | ICD-10-CM | POA: Diagnosis not present

## 2020-02-21 DIAGNOSIS — I700 Atherosclerosis of aorta: Secondary | ICD-10-CM | POA: Diagnosis not present

## 2020-02-26 ENCOUNTER — Other Ambulatory Visit: Payer: Self-pay | Admitting: Specialist

## 2020-02-26 DIAGNOSIS — R519 Headache, unspecified: Secondary | ICD-10-CM

## 2020-03-03 DIAGNOSIS — Z008 Encounter for other general examination: Secondary | ICD-10-CM | POA: Diagnosis not present

## 2020-03-03 DIAGNOSIS — Z6835 Body mass index (BMI) 35.0-35.9, adult: Secondary | ICD-10-CM | POA: Diagnosis not present

## 2020-03-03 DIAGNOSIS — Z791 Long term (current) use of non-steroidal anti-inflammatories (NSAID): Secondary | ICD-10-CM | POA: Diagnosis not present

## 2020-03-03 DIAGNOSIS — Z7982 Long term (current) use of aspirin: Secondary | ICD-10-CM | POA: Diagnosis not present

## 2020-03-03 DIAGNOSIS — R32 Unspecified urinary incontinence: Secondary | ICD-10-CM | POA: Diagnosis not present

## 2020-03-03 DIAGNOSIS — M0550 Rheumatoid polyneuropathy with rheumatoid arthritis of unspecified site: Secondary | ICD-10-CM | POA: Diagnosis not present

## 2020-03-03 DIAGNOSIS — K219 Gastro-esophageal reflux disease without esophagitis: Secondary | ICD-10-CM | POA: Diagnosis not present

## 2020-03-03 DIAGNOSIS — E1142 Type 2 diabetes mellitus with diabetic polyneuropathy: Secondary | ICD-10-CM | POA: Diagnosis not present

## 2020-03-03 DIAGNOSIS — G8929 Other chronic pain: Secondary | ICD-10-CM | POA: Diagnosis not present

## 2020-03-03 DIAGNOSIS — I10 Essential (primary) hypertension: Secondary | ICD-10-CM | POA: Diagnosis not present

## 2020-03-04 DIAGNOSIS — M791 Myalgia, unspecified site: Secondary | ICD-10-CM | POA: Diagnosis not present

## 2020-03-04 DIAGNOSIS — M542 Cervicalgia: Secondary | ICD-10-CM | POA: Diagnosis not present

## 2020-03-04 DIAGNOSIS — G518 Other disorders of facial nerve: Secondary | ICD-10-CM | POA: Diagnosis not present

## 2020-03-04 DIAGNOSIS — R519 Headache, unspecified: Secondary | ICD-10-CM | POA: Diagnosis not present

## 2020-03-06 DIAGNOSIS — Z86010 Personal history of colonic polyps: Secondary | ICD-10-CM | POA: Diagnosis not present

## 2020-03-06 DIAGNOSIS — R1033 Periumbilical pain: Secondary | ICD-10-CM | POA: Diagnosis not present

## 2020-03-06 DIAGNOSIS — K760 Fatty (change of) liver, not elsewhere classified: Secondary | ICD-10-CM | POA: Diagnosis not present

## 2020-03-06 DIAGNOSIS — E669 Obesity, unspecified: Secondary | ICD-10-CM | POA: Diagnosis not present

## 2020-03-06 DIAGNOSIS — K219 Gastro-esophageal reflux disease without esophagitis: Secondary | ICD-10-CM | POA: Diagnosis not present

## 2020-03-06 DIAGNOSIS — R1013 Epigastric pain: Secondary | ICD-10-CM | POA: Diagnosis not present

## 2020-03-06 DIAGNOSIS — K5730 Diverticulosis of large intestine without perforation or abscess without bleeding: Secondary | ICD-10-CM | POA: Diagnosis not present

## 2020-03-07 ENCOUNTER — Other Ambulatory Visit: Payer: Self-pay | Admitting: Gastroenterology

## 2020-03-07 DIAGNOSIS — R1013 Epigastric pain: Secondary | ICD-10-CM

## 2020-03-10 DIAGNOSIS — R69 Illness, unspecified: Secondary | ICD-10-CM | POA: Diagnosis not present

## 2020-03-12 DIAGNOSIS — K219 Gastro-esophageal reflux disease without esophagitis: Secondary | ICD-10-CM | POA: Diagnosis not present

## 2020-03-12 DIAGNOSIS — R1013 Epigastric pain: Secondary | ICD-10-CM | POA: Diagnosis not present

## 2020-03-12 DIAGNOSIS — K319 Disease of stomach and duodenum, unspecified: Secondary | ICD-10-CM | POA: Diagnosis not present

## 2020-03-12 DIAGNOSIS — K2980 Duodenitis without bleeding: Secondary | ICD-10-CM | POA: Diagnosis not present

## 2020-03-12 DIAGNOSIS — K2970 Gastritis, unspecified, without bleeding: Secondary | ICD-10-CM | POA: Diagnosis not present

## 2020-03-18 DIAGNOSIS — M542 Cervicalgia: Secondary | ICD-10-CM | POA: Diagnosis not present

## 2020-03-18 DIAGNOSIS — G518 Other disorders of facial nerve: Secondary | ICD-10-CM | POA: Diagnosis not present

## 2020-03-18 DIAGNOSIS — R519 Headache, unspecified: Secondary | ICD-10-CM | POA: Diagnosis not present

## 2020-03-18 DIAGNOSIS — M791 Myalgia, unspecified site: Secondary | ICD-10-CM | POA: Diagnosis not present

## 2020-03-20 ENCOUNTER — Other Ambulatory Visit: Payer: Self-pay

## 2020-03-20 ENCOUNTER — Ambulatory Visit
Admission: RE | Admit: 2020-03-20 | Discharge: 2020-03-20 | Disposition: A | Discharge status: Home / Self Care | Payer: Medicare HMO | Source: Ambulatory Visit | Attending: Specialist | Admitting: Specialist

## 2020-03-20 DIAGNOSIS — R519 Headache, unspecified: Secondary | ICD-10-CM

## 2020-03-20 IMAGING — MR MR HEAD WO/W CM
12 series · 48 of 48 positions shown · IV contrast (multihance)
Comparison: 6068

CLINICAL DATA: Headaches

EXAM:
MRI HEAD WITHOUT AND WITH CONTRAST
TECHNIQUE: Multiplanar, multiecho pulse sequences of the brain and surrounding
structures were obtained without and with intravenous contrast.
CONTRAST:  20mL MULTIHANCE GADOBENATE DIMEGLUMINE 529 MG/ML IV SOLN

[Series 2: T1 · sagittal · 5.0mm · 0.45mm/px · 2 of 22 slices shown]
[im 1/22]
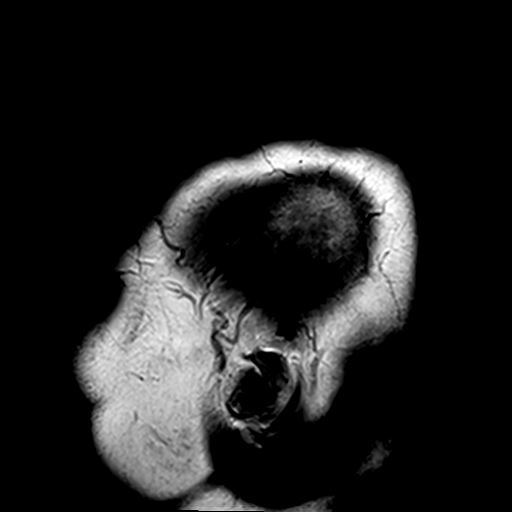
[im 22/22]
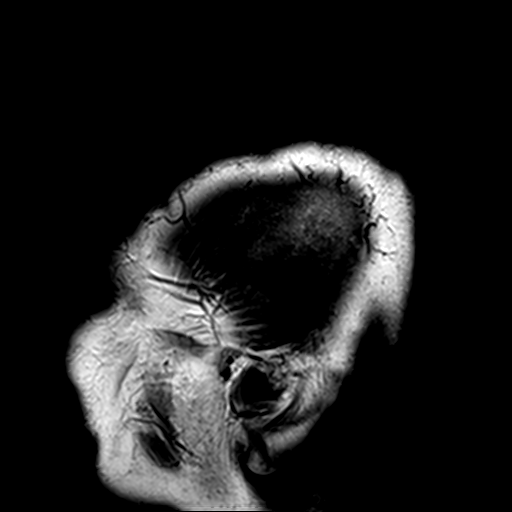

[Series 3: DWI · axial · 3.0mm · 1.80mm/px · z∈[-49,+95]mm · 7 of 100 slices shown (1 of 4)]
[im 1/100]
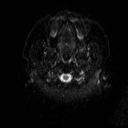
[im 17/100]
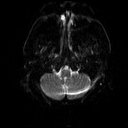
[im 34/100]
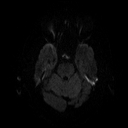
[im 50/100]
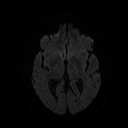
[im 67/100]
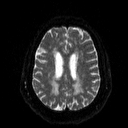
[im 83/100]
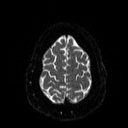
[im 100/100]
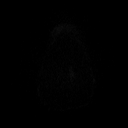

[Series 4: DWI · axial · 3.0mm · 1.80mm/px · z∈[-49,+95]mm · 3 of 50 slices shown (2 of 4)]
[im 1/50]
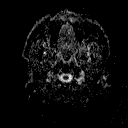
[im 25/50]
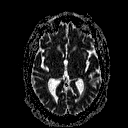
[im 50/50]
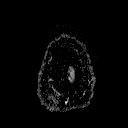

[Series 5: DWI · coronal · 5.0mm · 1.80mm/px · 4 of 63 slices shown (3 of 4)]
[im 1/63]
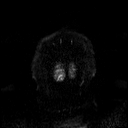
[im 21/63]
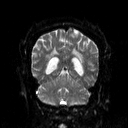
[im 42/63]
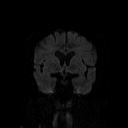
[im 63/63]
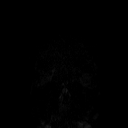

[Series 6: DWI · coronal · 5.0mm · 1.80mm/px · 2 of 34 slices shown (4 of 4)]
[im 1/34]
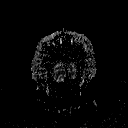
[im 34/34]
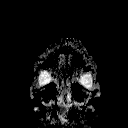

[Series 7: FLAIR · axial · 3.0mm · 0.45mm/px · z∈[-48,+93]mm · 2 of 32 slices shown]
[im 1/32]
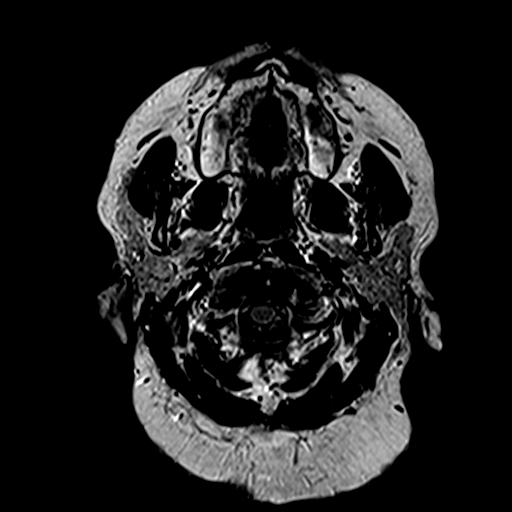
[im 32/32]
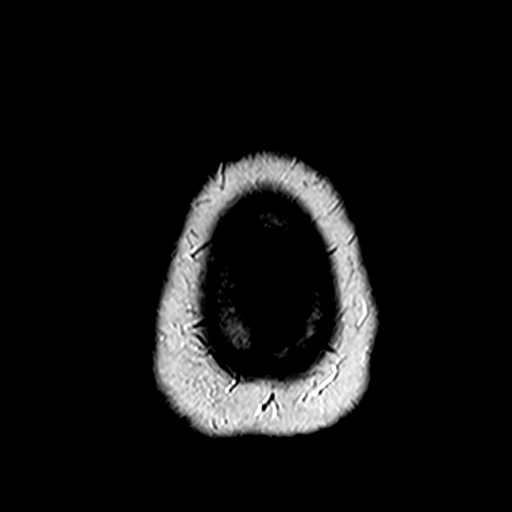

[Series 8: T2 · axial · 5.0mm · 0.51mm/px · 1 of 22 slices shown (1 of 2)]
[im 1/22]
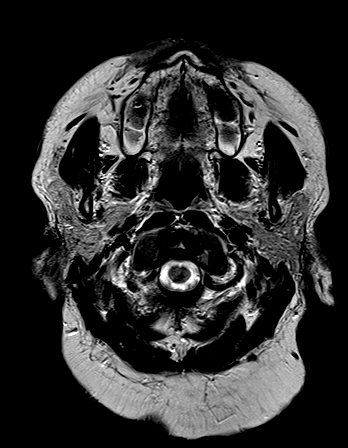

[Series 10: swi_images · axial · 4.0mm · 0.90mm/px · z∈[-55,+98]mm · 3 of 40 slices shown]
[im 1/40]
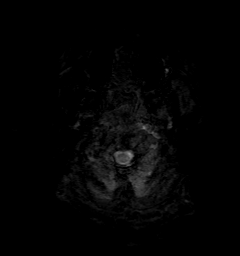
[im 20/40]
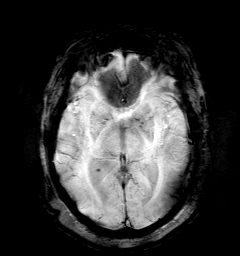
[im 40/40]
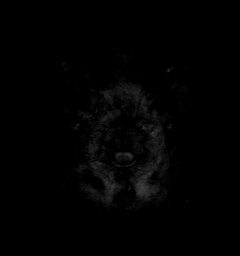

[Series 11: t1_mpr_(person_name) · axial · 1.0mm · 0.75mm/px · z∈[-49,+91]mm · 10 of 144 slices shown (1 of 2)]
[im 1/144]
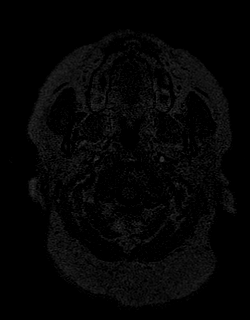
[im 16/144]
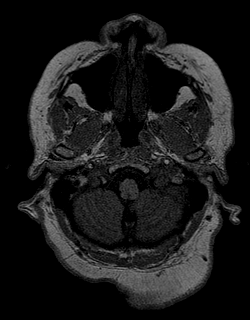
[im 32/144]
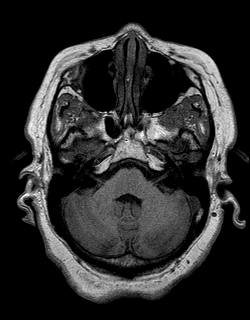
[im 48/144]
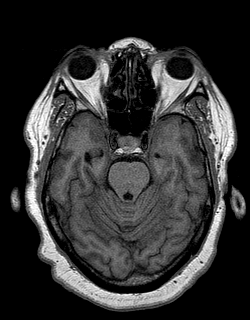
[im 64/144]
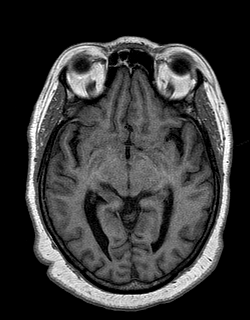
[im 80/144]
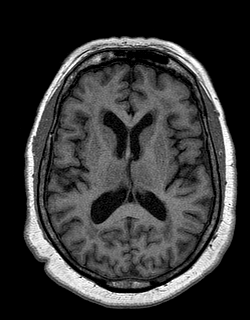
[im 96/144]
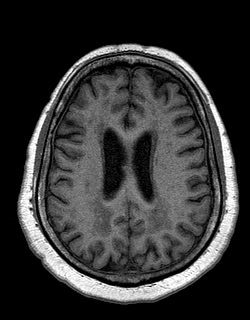
[im 112/144]
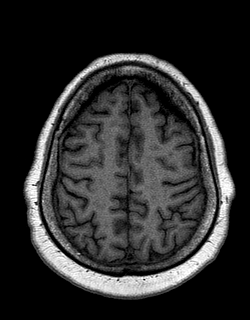
[im 128/144]
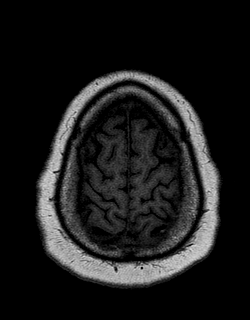
[im 144/144]
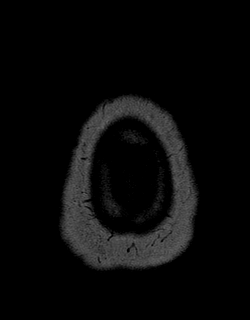

[Series 12: T2 · coronal · 5.0mm · 0.45mm/px · 2 of 27 slices shown (2 of 2)]
[im 1/27]
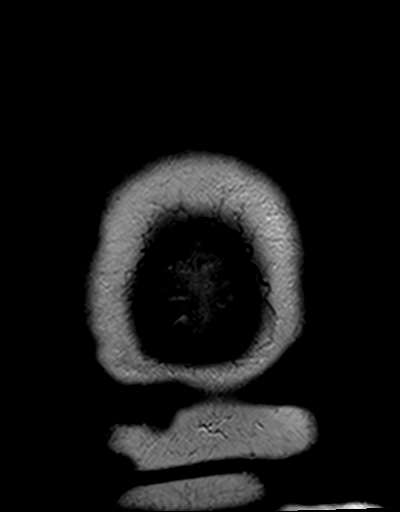
[im 27/27]
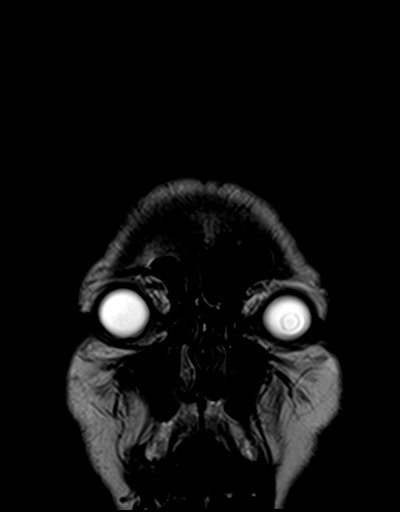

[Series 13: t1_mpr_(person_name) · axial · 1.0mm · 0.75mm/px · z∈[-49,+91]mm · 10 of 144 slices shown (2 of 2)]
[im 1/144]
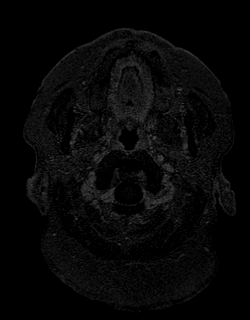
[im 16/144]
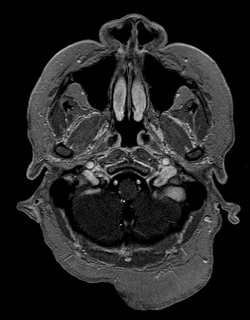
[im 32/144]
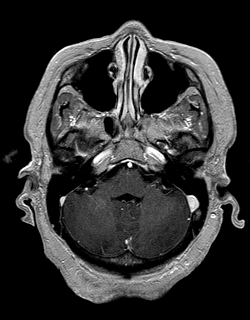
[im 48/144]
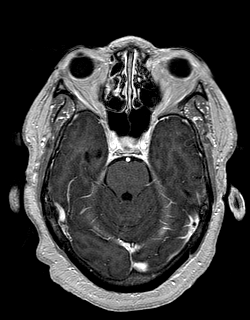
[im 64/144]
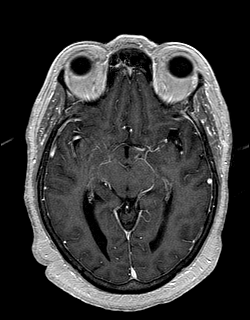
[im 80/144]
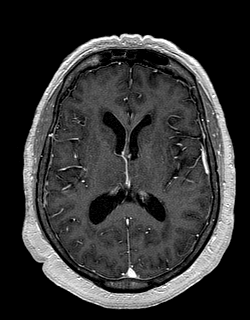
[im 96/144]
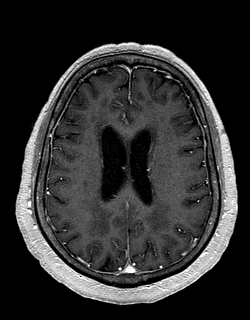
[im 112/144]
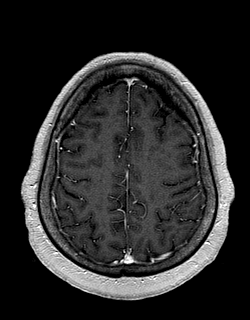
[im 128/144]
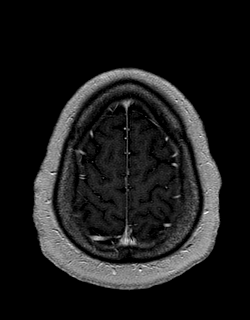
[im 144/144]
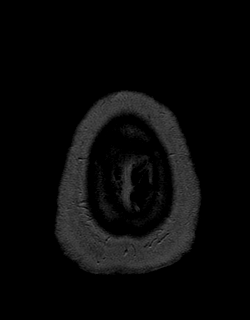

[Series 14: post cor · coronal · 5.0mm · 0.45mm/px · 2 of 27 slices shown]
[im 1/27]
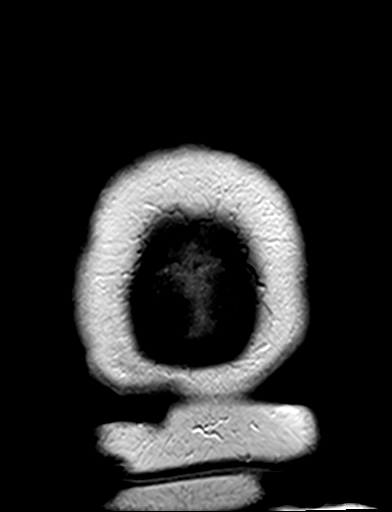
[im 27/27]
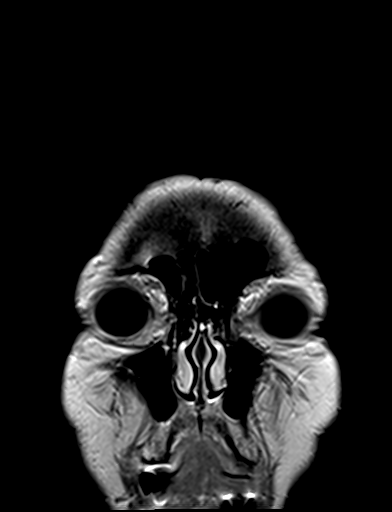

[48 of 48 positions shown; findings below may reference images not displayed]

FINDINGS: Brain: There is no acute infarction or intracranial hemorrhage.
There is no intracranial mass, mass effect, or edema. There is no
hydrocephalus or extra-axial fluid collection. Patchy and confluent
areas of T2 hyperintensity in the supratentorial and pontine white
matter are nonspecific but probably reflect moderate chronic
microvascular ischemic changes. No abnormal enhancement.

Vascular: Major vessel flow voids at the skull base are preserved.

Skull and upper cervical spine: Normal marrow signal is preserved.

Sinuses/Orbits: Minor mucosal thickening.  Orbits are unremarkable.

Other: Sella is unremarkable.  Mastoid air cells are clear.
IMPRESSION: No intracranial mass or abnormal enhancement.

Moderate chronic microvascular ischemic changes.

## 2020-03-20 MED ORDER — GADOBENATE DIMEGLUMINE 529 MG/ML IV SOLN
20.0000 mL | Freq: Once | INTRAVENOUS | Status: AC | PRN
  Administered 2020-03-20: 20 mL via INTRAVENOUS

## 2020-03-24 ENCOUNTER — Ambulatory Visit
Admission: RE | Admit: 2020-03-24 | Discharge: 2020-03-24 | Disposition: A | Discharge status: Home / Self Care | Payer: Medicare HMO | Source: Ambulatory Visit | Attending: Gastroenterology | Admitting: Gastroenterology

## 2020-03-24 DIAGNOSIS — K6389 Other specified diseases of intestine: Secondary | ICD-10-CM | POA: Diagnosis not present

## 2020-03-24 DIAGNOSIS — R1013 Epigastric pain: Secondary | ICD-10-CM

## 2020-03-24 IMAGING — CT CT ABD-PELV W/ CM
2 of 5 series · 13 of 46 positions shown, 15 images · IV contrast (iopamidol)
Comparison: 12/09/2019

CLINICAL DATA: Abdominal pain, bloating

EXAM:
CT ABDOMEN AND PELVIS WITH CONTRAST
TECHNIQUE: Multidetector CT imaging of the abdomen and pelvis was performed
using the standard protocol following bolus administration of
intravenous contrast.
CONTRAST:  100mL 6I8GRN-6TT IOPAMIDOL (6I8GRN-6TT) INJECTION 61%
Creatinine was obtained on site at [HOSPITAL] at [REDACTED].
Results: Creatinine 0.6 mg/dL.

[Series 2: abd pelvis 5.00 br40 s3 axial · axial · 0.69mm/px · z∈[+1257,+1672]mm · 10 of 93 slices shown, 12 images]
[im 5/93  soft-tissue]
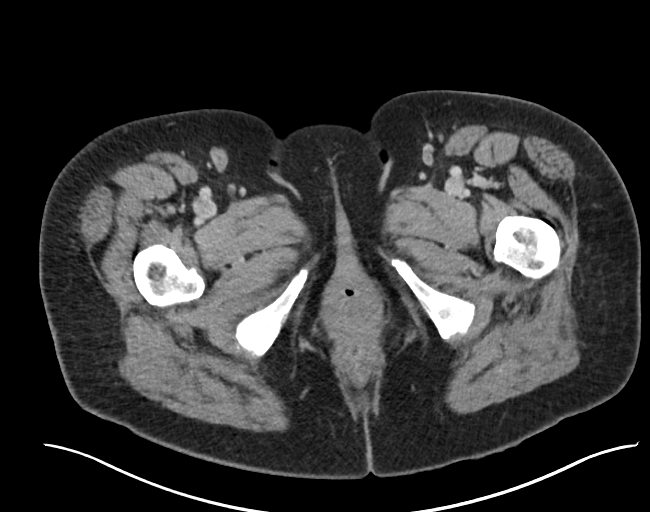
[im 5/93  bone]
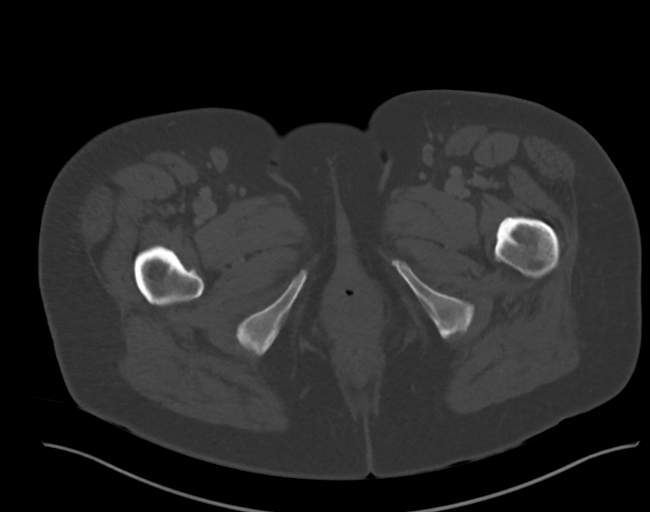
[im 14/93  soft-tissue]
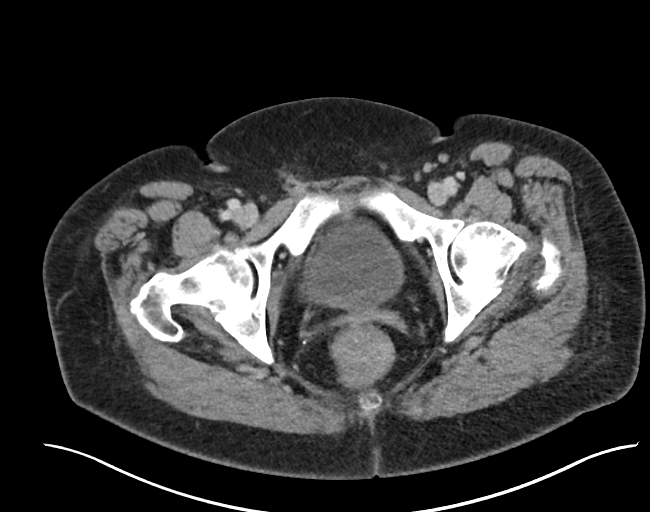
[im 24/93  soft-tissue]
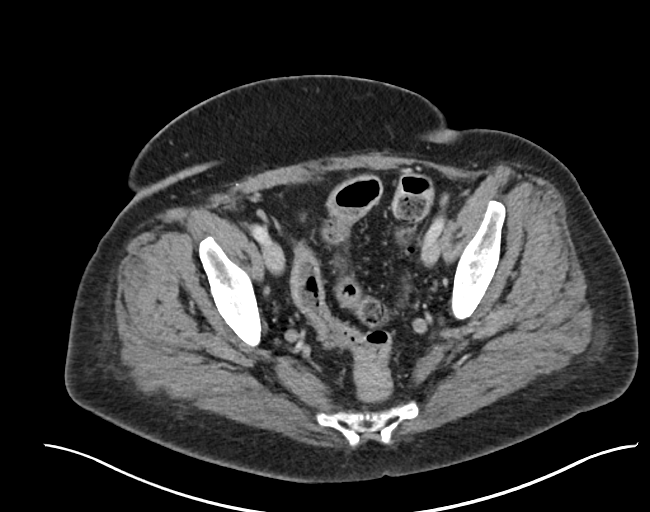
[im 33/93  soft-tissue]
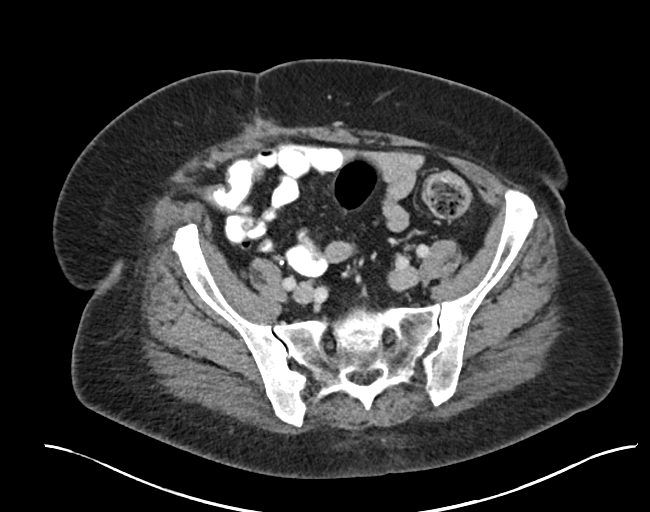
[im 42/93  soft-tissue]
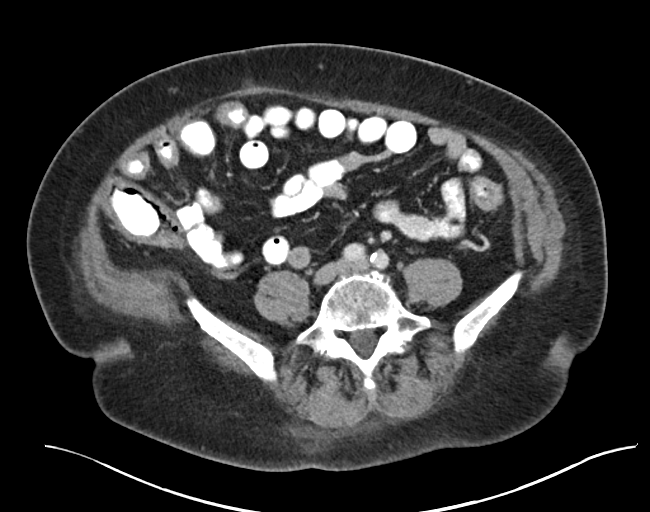
[im 51/93  soft-tissue]
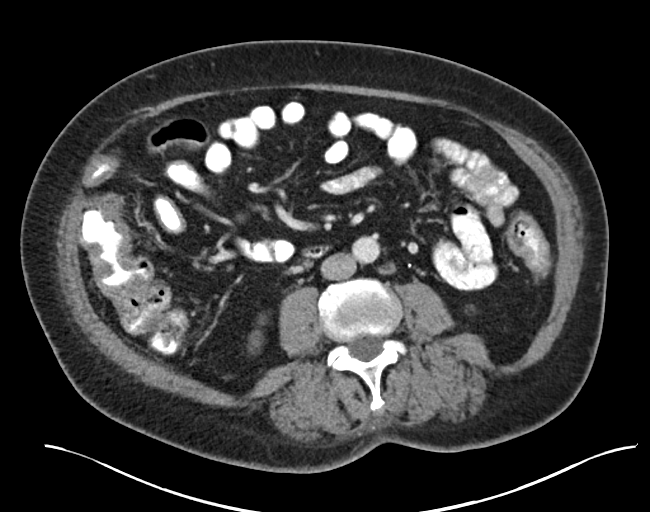
[im 60/93  soft-tissue]
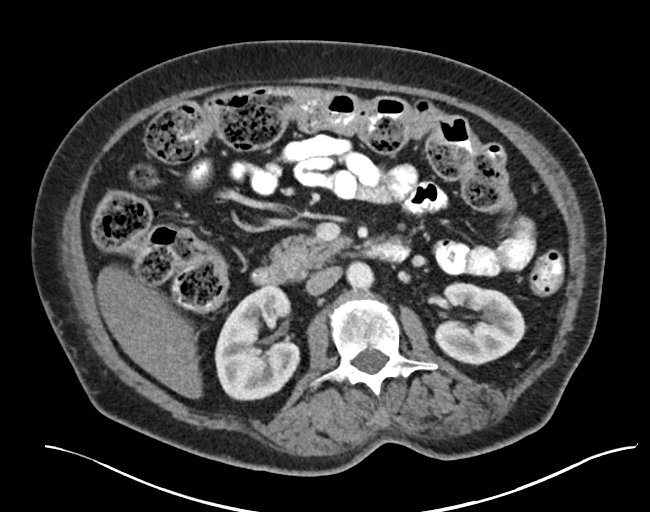
[im 70/93  soft-tissue]
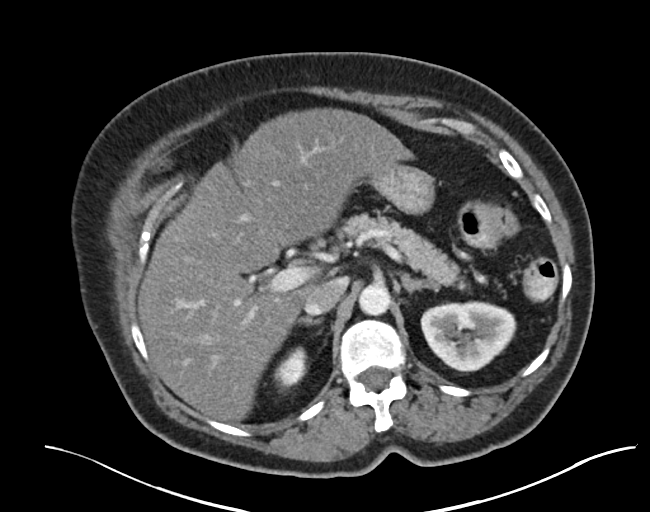
[im 79/93  soft-tissue]
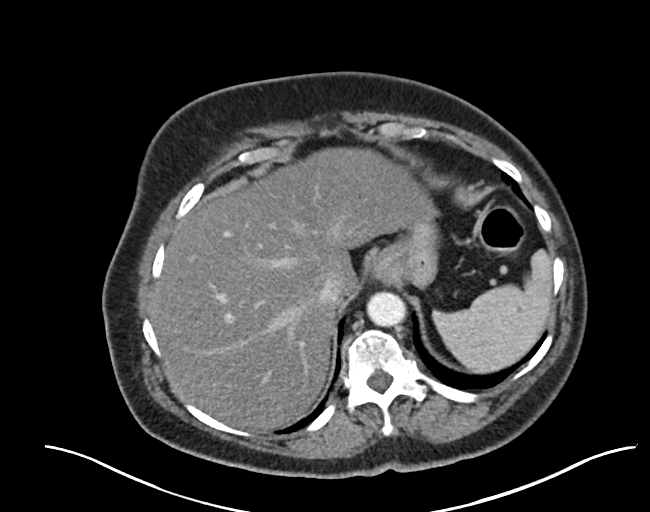
[im 79/93  bone]
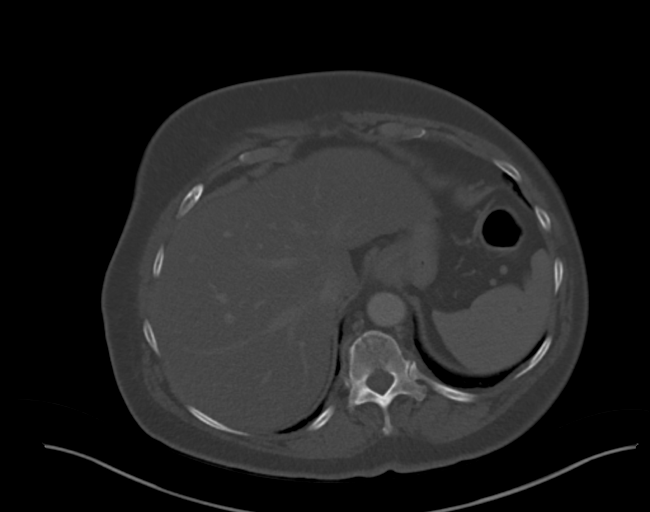
[im 88/93  soft-tissue]
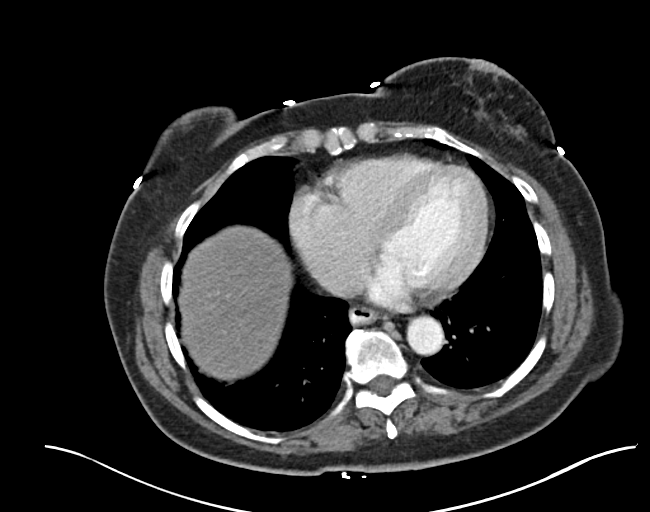

[Series 6: abd pelvis 2.00 br40 s3 cor · coronal · 0.88mm/px · 3 of 177 slices shown]
[im 59/177  soft-tissue]
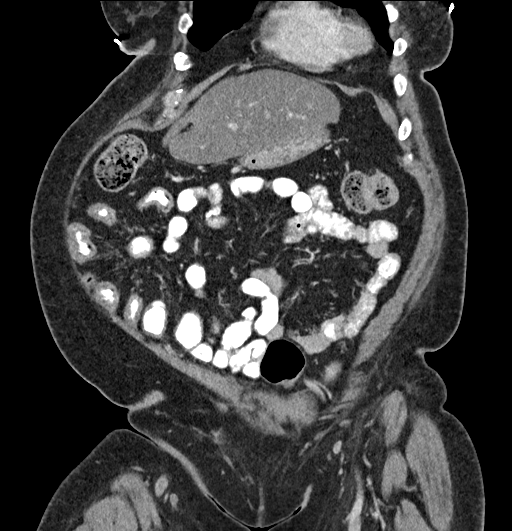
[im 79/177  soft-tissue]
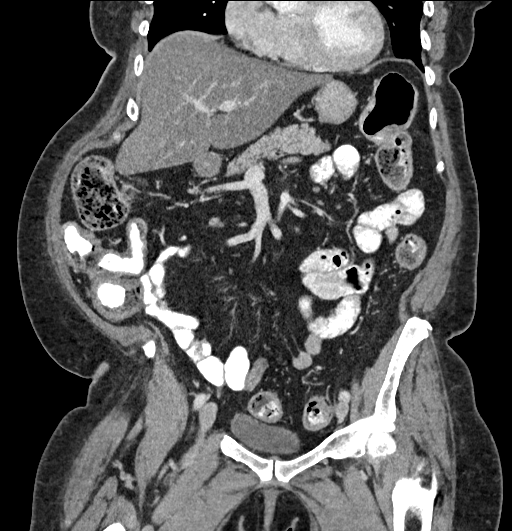
[im 98/177  soft-tissue]
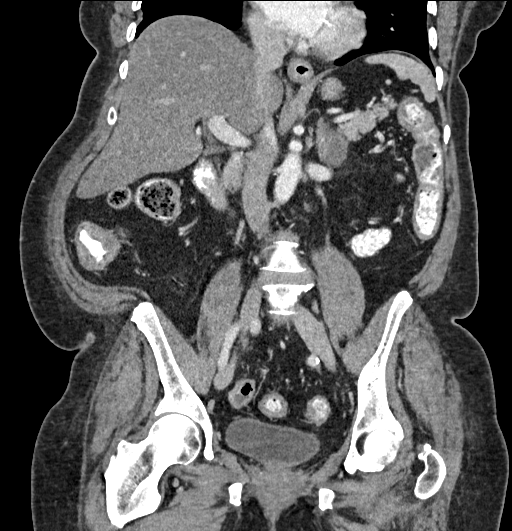

[13 of 46 positions shown; findings below may reference images not displayed]

FINDINGS: Lower chest: Lung bases are essentially clear, noting faint
subpleural reticulation at the lung bases.

Hepatobiliary: Liver is within normal limits.

Status post cholecystectomy. No intrahepatic or extrahepatic ductal
dilatation.

Pancreas: Within normal limits.

Spleen: Within normal limits.

Adrenals/Urinary Tract: Adrenal glands are within normal limits.

Kidneys are within normal limits.  No hydronephrosis.

Bladder is within normal limits.

Stomach/Bowel: Stomach is notable for a tiny hiatal hernia.

No evidence of bowel obstruction.

Normal appendix (series 2/image 51).

Mild wall thickening involving the ascending colon (series 2/image
44), new, suggesting infectious/inflammatory colitis. No pneumatosis
or free air.

Mild to moderate right colonic stool burden.

Vascular/Lymphatic: No evidence of abdominal aortic aneurysm.

Mild atherosclerotic calcifications of the abdominal aorta and
branch vessels.

No suspicious abdominopelvic lymphadenopathy.

Reproductive: Status post hysterectomy.

No adnexal masses.

Other: No abdominopelvic ascites.

Musculoskeletal: Degenerative changes of the visualized
thoracolumbar spine.
IMPRESSION: Mild wall thickening involving the ascending colon, suggesting
infectious/inflammatory colitis. No pneumatosis or free air.

No evidence of bowel obstruction.  Normal appendix.

## 2020-03-24 MED ORDER — IOPAMIDOL (ISOVUE-300) INJECTION 61%
100.0000 mL | Freq: Once | INTRAVENOUS | Status: AC | PRN
  Administered 2020-03-24: 100 mL via INTRAVENOUS

## 2020-04-02 ENCOUNTER — Emergency Department (HOSPITAL_COMMUNITY): Payer: Medicare HMO

## 2020-04-02 ENCOUNTER — Emergency Department (HOSPITAL_COMMUNITY)
Admission: EM | Admit: 2020-04-02 | Discharge: 2020-04-02 | Disposition: A | Discharge status: Home / Self Care | Payer: Medicare HMO | Attending: Emergency Medicine | Admitting: Emergency Medicine

## 2020-04-02 ENCOUNTER — Encounter (HOSPITAL_COMMUNITY): Payer: Self-pay | Admitting: Emergency Medicine

## 2020-04-02 ENCOUNTER — Other Ambulatory Visit: Payer: Self-pay

## 2020-04-02 DIAGNOSIS — Z79899 Other long term (current) drug therapy: Secondary | ICD-10-CM | POA: Insufficient documentation

## 2020-04-02 DIAGNOSIS — Y999 Unspecified external cause status: Secondary | ICD-10-CM | POA: Insufficient documentation

## 2020-04-02 DIAGNOSIS — Z7984 Long term (current) use of oral hypoglycemic drugs: Secondary | ICD-10-CM | POA: Insufficient documentation

## 2020-04-02 DIAGNOSIS — R0789 Other chest pain: Secondary | ICD-10-CM | POA: Diagnosis not present

## 2020-04-02 DIAGNOSIS — S8011XA Contusion of right lower leg, initial encounter: Secondary | ICD-10-CM

## 2020-04-02 DIAGNOSIS — S299XXA Unspecified injury of thorax, initial encounter: Secondary | ICD-10-CM | POA: Diagnosis not present

## 2020-04-02 DIAGNOSIS — I10 Essential (primary) hypertension: Secondary | ICD-10-CM | POA: Diagnosis not present

## 2020-04-02 DIAGNOSIS — S0990XA Unspecified injury of head, initial encounter: Secondary | ICD-10-CM | POA: Diagnosis not present

## 2020-04-02 DIAGNOSIS — S20212A Contusion of left front wall of thorax, initial encounter: Secondary | ICD-10-CM

## 2020-04-02 DIAGNOSIS — Y93I9 Activity, other involving external motion: Secondary | ICD-10-CM | POA: Insufficient documentation

## 2020-04-02 DIAGNOSIS — R519 Headache, unspecified: Secondary | ICD-10-CM | POA: Diagnosis not present

## 2020-04-02 DIAGNOSIS — R109 Unspecified abdominal pain: Secondary | ICD-10-CM | POA: Diagnosis not present

## 2020-04-02 DIAGNOSIS — R911 Solitary pulmonary nodule: Secondary | ICD-10-CM

## 2020-04-02 DIAGNOSIS — Y92410 Unspecified street and highway as the place of occurrence of the external cause: Secondary | ICD-10-CM | POA: Diagnosis not present

## 2020-04-02 DIAGNOSIS — R079 Chest pain, unspecified: Secondary | ICD-10-CM | POA: Diagnosis not present

## 2020-04-02 DIAGNOSIS — Z7982 Long term (current) use of aspirin: Secondary | ICD-10-CM | POA: Diagnosis not present

## 2020-04-02 DIAGNOSIS — S161XXA Strain of muscle, fascia and tendon at neck level, initial encounter: Secondary | ICD-10-CM

## 2020-04-02 DIAGNOSIS — E119 Type 2 diabetes mellitus without complications: Secondary | ICD-10-CM | POA: Insufficient documentation

## 2020-04-02 DIAGNOSIS — R102 Pelvic and perineal pain: Secondary | ICD-10-CM | POA: Diagnosis not present

## 2020-04-02 DIAGNOSIS — M79661 Pain in right lower leg: Secondary | ICD-10-CM | POA: Diagnosis not present
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
## 2020-04-02 IMAGING — CR DG TIBIA/FIBULA 2V*R*
3 series · 3 of 3 positions shown · non-contrast
Comparison: None.

CLINICAL DATA: 73-year-old female with right lower extremity pain.

EXAM:
RIGHT TIBIA AND FIBULA - 2 VIEW

[x tib-fib lat right (1 of 2)]
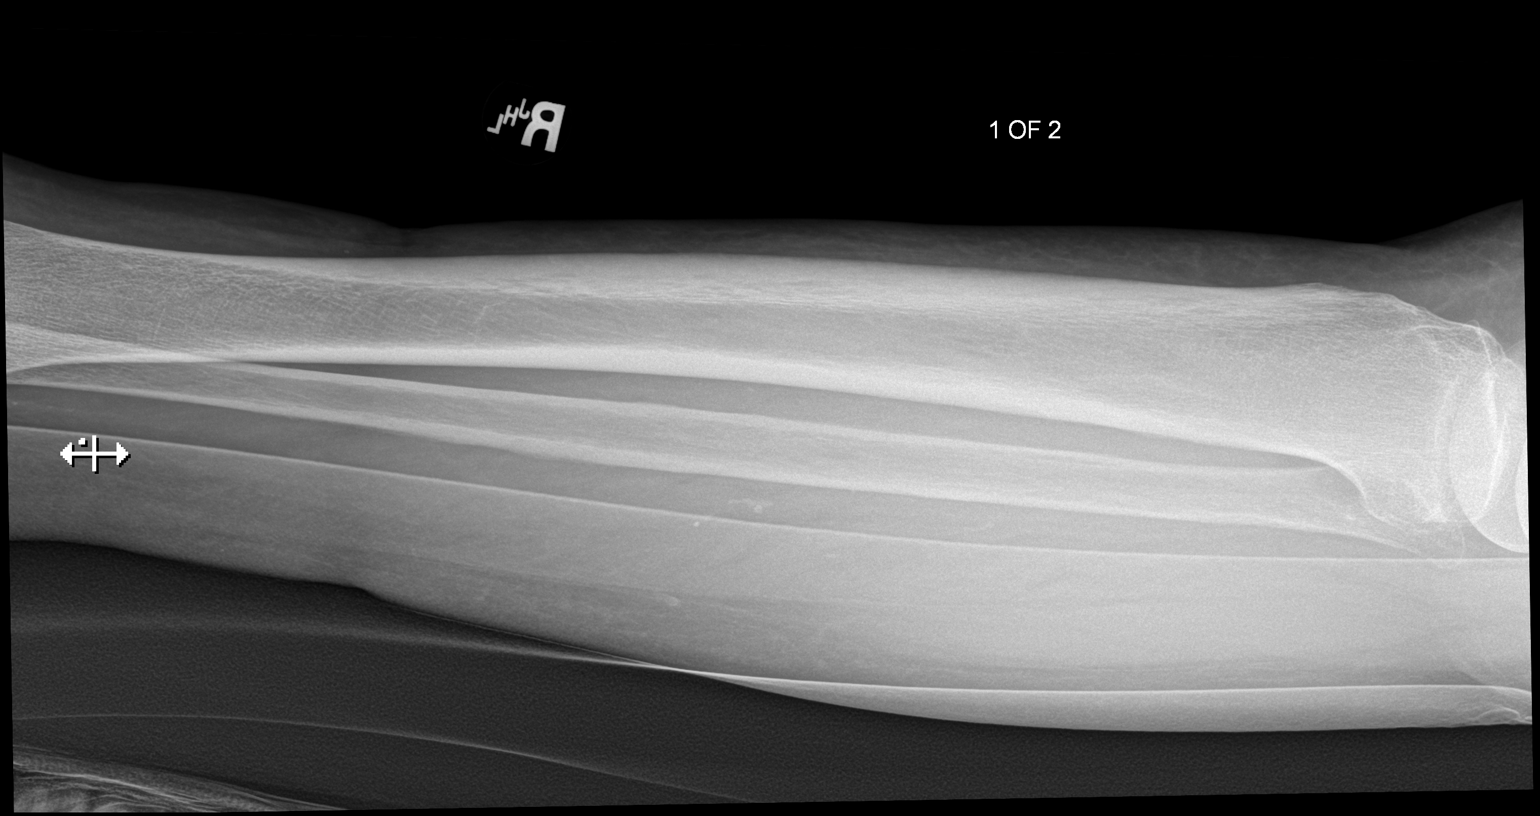

[x tib-fib lat right (2 of 2)]
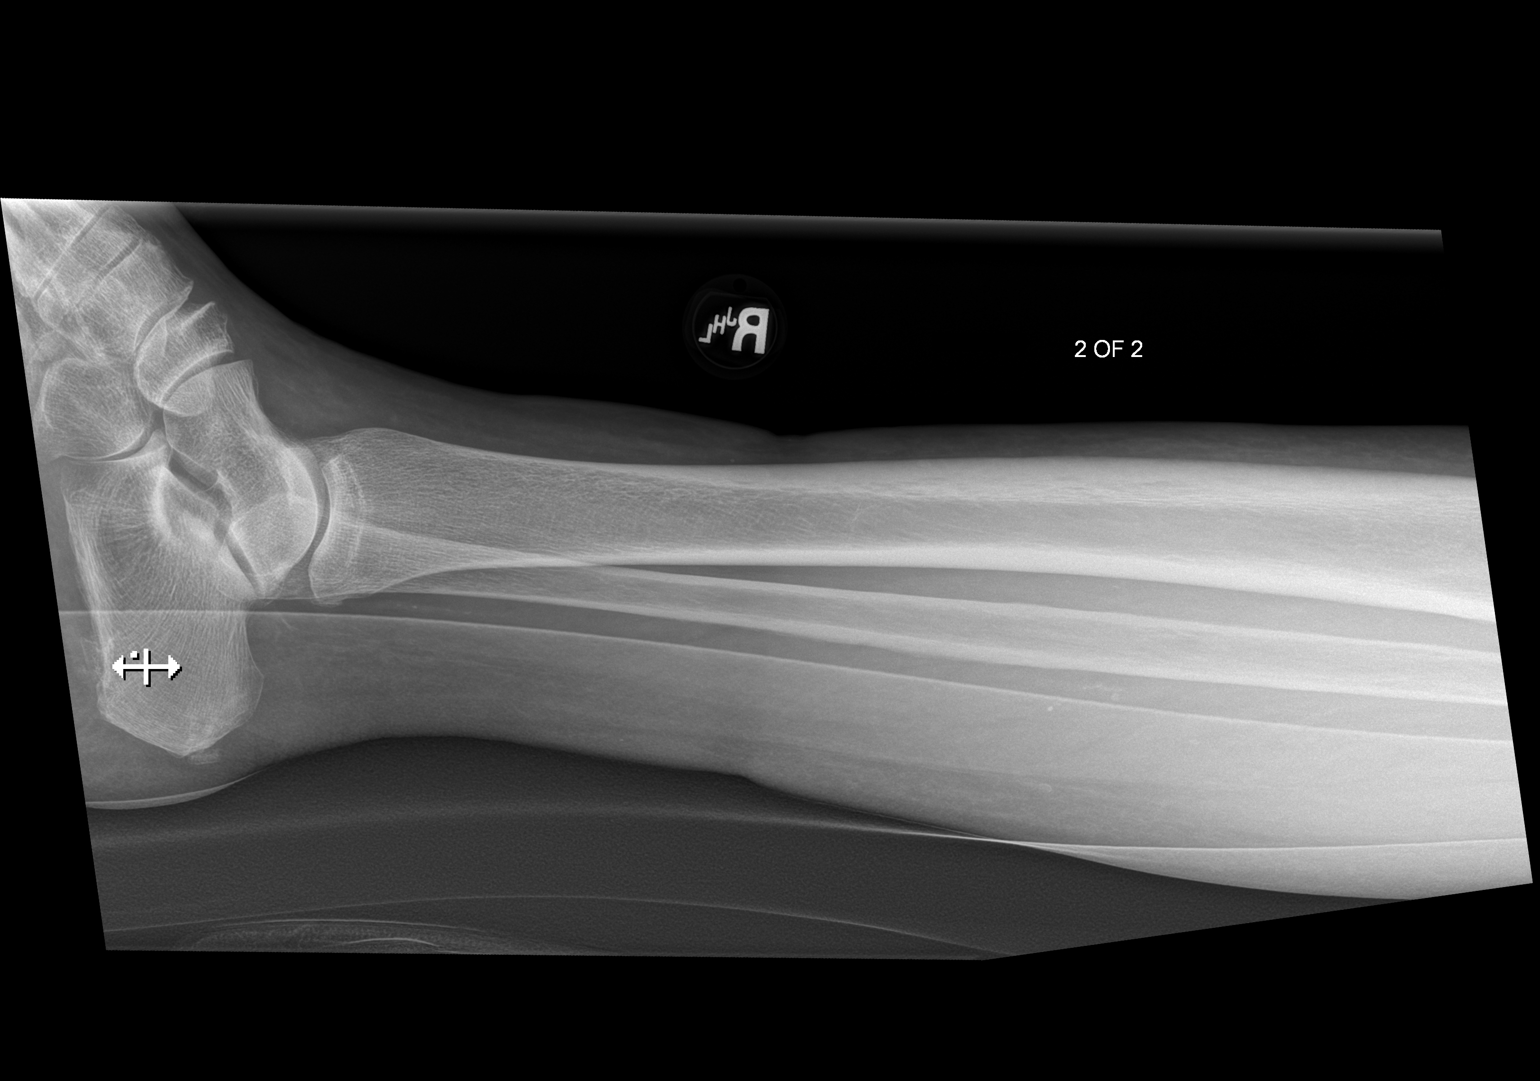

[x tib-fib ap right]
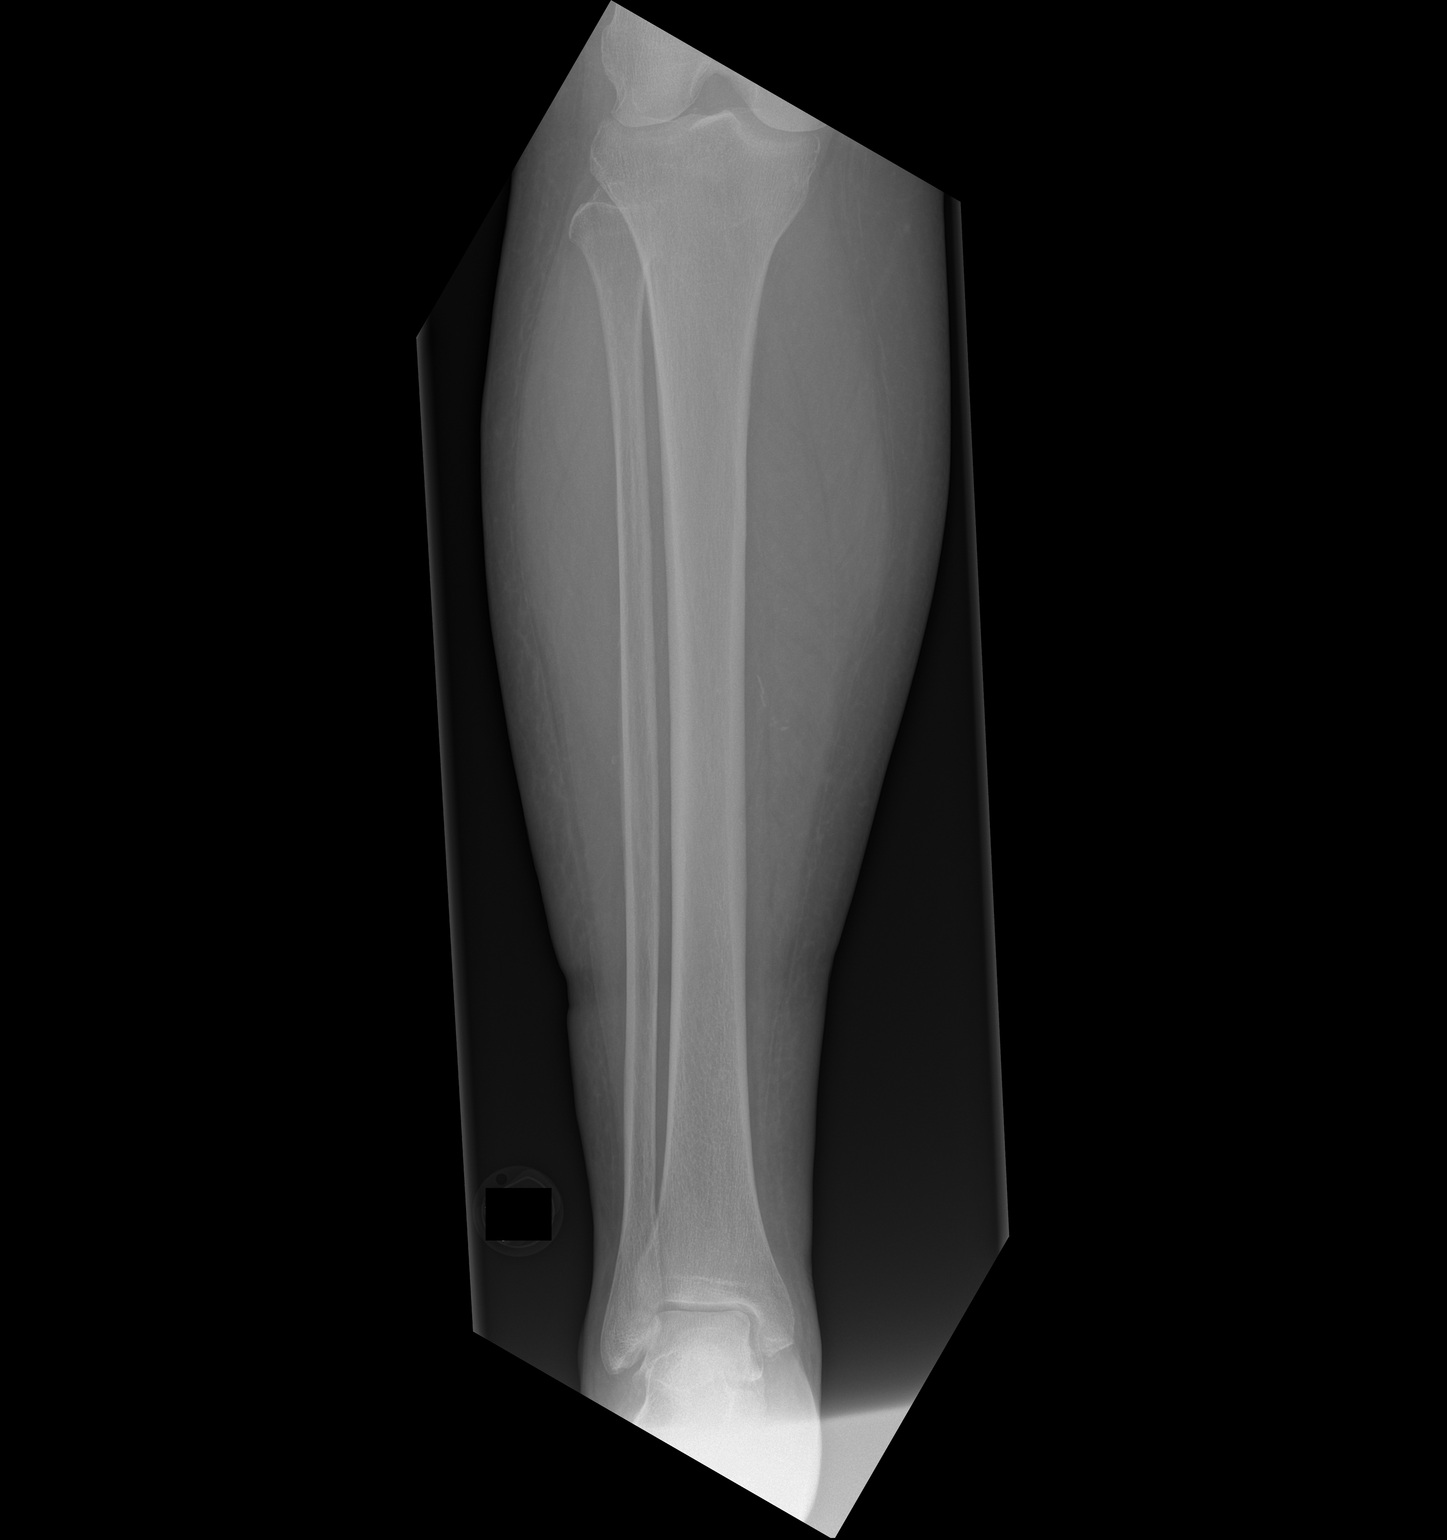

[3 of 3 positions shown; findings below may reference images not displayed]

FINDINGS: There is no acute fracture or dislocation. No significant arthritic
changes. The soft tissues are unremarkable.
IMPRESSION: Negative.
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
## 2020-04-02 MED ORDER — HYDROCODONE-ACETAMINOPHEN 5-325 MG PO TABS: 1.0000 | ORAL_TABLET | ORAL | 0 refills | Status: DC | PRN

## 2020-04-02 MED ORDER — HYDROCODONE-ACETAMINOPHEN 5-325 MG PO TABS
1.0000 | ORAL_TABLET | Freq: Once | ORAL | Status: AC
  Administered 2020-04-02: 1 via ORAL
  Filled 2020-04-02: qty 1

## 2020-04-02 NOTE — ED Notes (Signed)
Unsuccessful IV attempt by this RN 

## 2020-04-02 NOTE — ED Notes (Signed)
Patient transported to CT 

## 2020-04-02 NOTE — ED Notes (Signed)
Charge RN attempting IV at this time.

## 2020-04-02 NOTE — ED Notes (Signed)
IV team reports two ahead of patient.

## 2020-04-02 NOTE — Discharge Instructions (Addendum)
You will need a repeat CT scan of your chest in 3-6 months to evaluate the lung nodule.
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
## 2020-04-02 NOTE — ED Notes (Signed)
MD made aware of delay, as patient is difficult stick.

## 2020-04-02 NOTE — ED Notes (Signed)
Patient reports daughter is on the way to provide transport.
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
## 2020-05-21 DIAGNOSIS — R1084 Generalized abdominal pain: Secondary | ICD-10-CM | POA: Diagnosis not present

## 2020-05-21 DIAGNOSIS — I10 Essential (primary) hypertension: Secondary | ICD-10-CM | POA: Diagnosis not present

## 2020-05-21 DIAGNOSIS — E1165 Type 2 diabetes mellitus with hyperglycemia: Secondary | ICD-10-CM | POA: Diagnosis not present

## 2020-05-21 DIAGNOSIS — R519 Headache, unspecified: Secondary | ICD-10-CM | POA: Diagnosis not present

## 2020-05-28 DIAGNOSIS — I10 Essential (primary) hypertension: Secondary | ICD-10-CM | POA: Diagnosis not present

## 2020-05-28 DIAGNOSIS — R519 Headache, unspecified: Secondary | ICD-10-CM | POA: Diagnosis not present

## 2020-05-28 DIAGNOSIS — R1084 Generalized abdominal pain: Secondary | ICD-10-CM | POA: Diagnosis not present

## 2020-05-28 DIAGNOSIS — E1165 Type 2 diabetes mellitus with hyperglycemia: Secondary | ICD-10-CM | POA: Diagnosis not present

## 2020-05-28 DIAGNOSIS — E1142 Type 2 diabetes mellitus with diabetic polyneuropathy: Secondary | ICD-10-CM | POA: Diagnosis not present

## 2020-06-03 DIAGNOSIS — R1033 Periumbilical pain: Secondary | ICD-10-CM | POA: Diagnosis not present

## 2020-06-03 DIAGNOSIS — R933 Abnormal findings on diagnostic imaging of other parts of digestive tract: Secondary | ICD-10-CM | POA: Diagnosis not present

## 2020-06-03 DIAGNOSIS — Z86010 Personal history of colonic polyps: Secondary | ICD-10-CM | POA: Diagnosis not present

## 2020-06-03 DIAGNOSIS — E669 Obesity, unspecified: Secondary | ICD-10-CM | POA: Diagnosis not present

## 2020-06-03 DIAGNOSIS — K760 Fatty (change of) liver, not elsewhere classified: Secondary | ICD-10-CM | POA: Diagnosis not present

## 2020-06-03 DIAGNOSIS — K5730 Diverticulosis of large intestine without perforation or abscess without bleeding: Secondary | ICD-10-CM | POA: Diagnosis not present

## 2020-06-06 DIAGNOSIS — R933 Abnormal findings on diagnostic imaging of other parts of digestive tract: Secondary | ICD-10-CM | POA: Diagnosis not present

## 2020-06-06 DIAGNOSIS — R194 Change in bowel habit: Secondary | ICD-10-CM | POA: Diagnosis not present

## 2020-06-06 DIAGNOSIS — Z1211 Encounter for screening for malignant neoplasm of colon: Secondary | ICD-10-CM | POA: Diagnosis not present

## 2020-06-26 ENCOUNTER — Other Ambulatory Visit: Payer: Self-pay | Admitting: Internal Medicine

## 2020-06-26 DIAGNOSIS — M79604 Pain in right leg: Secondary | ICD-10-CM

## 2020-06-26 DIAGNOSIS — R600 Localized edema: Secondary | ICD-10-CM | POA: Diagnosis not present

## 2020-06-26 DIAGNOSIS — K5730 Diverticulosis of large intestine without perforation or abscess without bleeding: Secondary | ICD-10-CM | POA: Diagnosis not present

## 2020-06-26 DIAGNOSIS — R1011 Right upper quadrant pain: Secondary | ICD-10-CM | POA: Diagnosis not present

## 2020-06-26 DIAGNOSIS — I872 Venous insufficiency (chronic) (peripheral): Secondary | ICD-10-CM | POA: Diagnosis not present

## 2020-06-26 DIAGNOSIS — E669 Obesity, unspecified: Secondary | ICD-10-CM | POA: Diagnosis not present

## 2020-06-26 DIAGNOSIS — K219 Gastro-esophageal reflux disease without esophagitis: Secondary | ICD-10-CM | POA: Diagnosis not present

## 2020-06-27 ENCOUNTER — Ambulatory Visit
Admission: RE | Admit: 2020-06-27 | Discharge: 2020-06-27 | Disposition: A | Discharge status: Home / Self Care | Payer: Medicare HMO | Source: Ambulatory Visit | Attending: Internal Medicine | Admitting: Internal Medicine

## 2020-06-27 DIAGNOSIS — M79604 Pain in right leg: Secondary | ICD-10-CM | POA: Diagnosis not present

## 2020-06-27 DIAGNOSIS — M79661 Pain in right lower leg: Secondary | ICD-10-CM | POA: Diagnosis not present

## 2020-06-27 IMAGING — US US EXTREM LOW VENOUS*R*
1 series · 13 of 24 positions shown · non-contrast
Comparison: None.

CLINICAL DATA: Right lower extremity pain for the past 2 weeks.
Evaluate for DVT.



[Series 1: us extrem low venous*right* · 0.08mm/px · 13 of 32 slices shown]
[im 1/32]
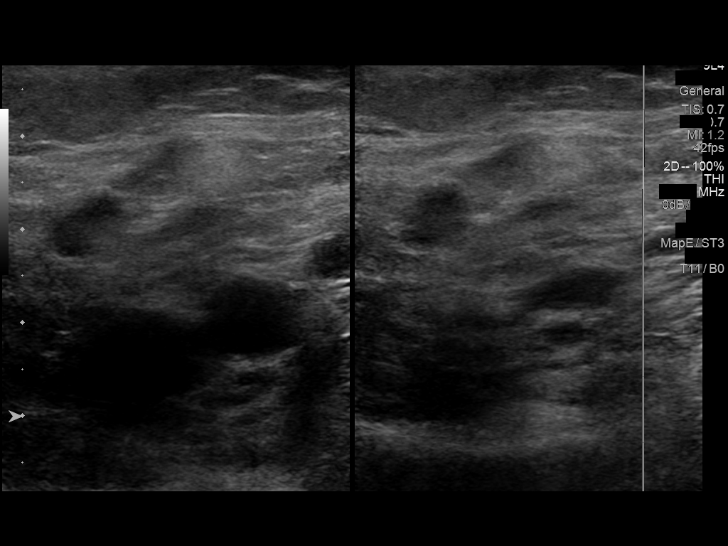
[im 3/32]
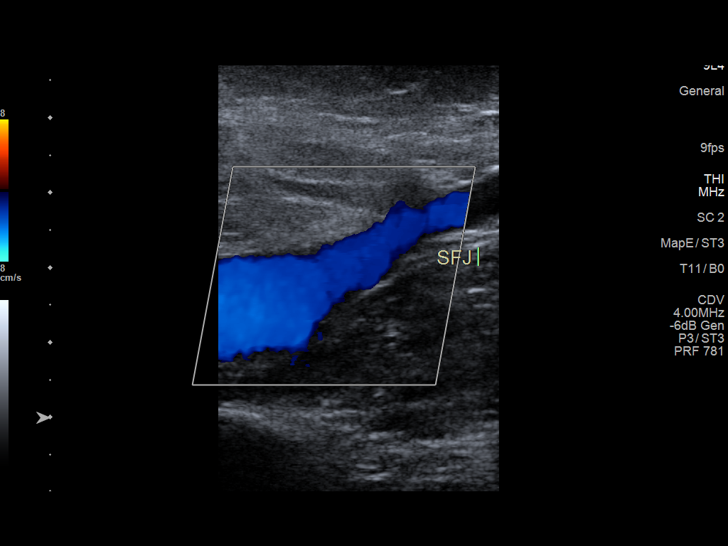
[im 6/32]
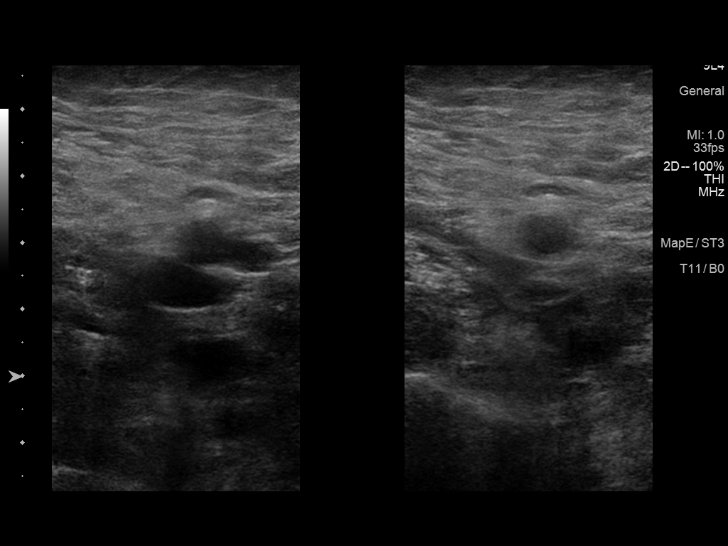
[im 9/32]
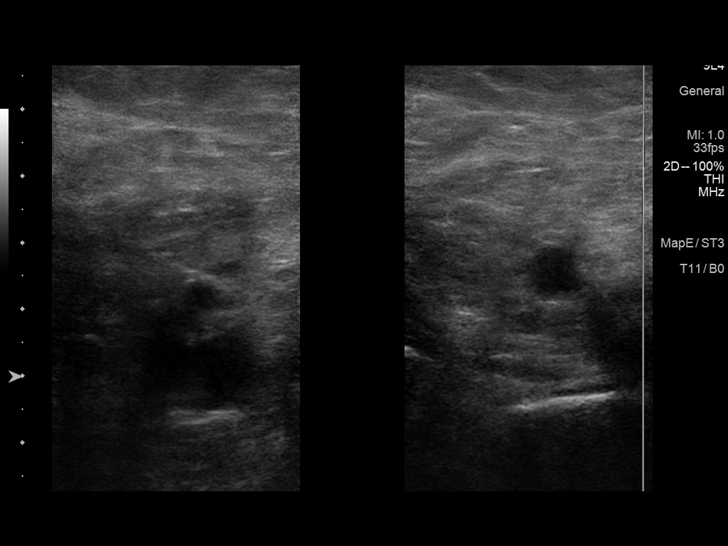
[im 11/32]
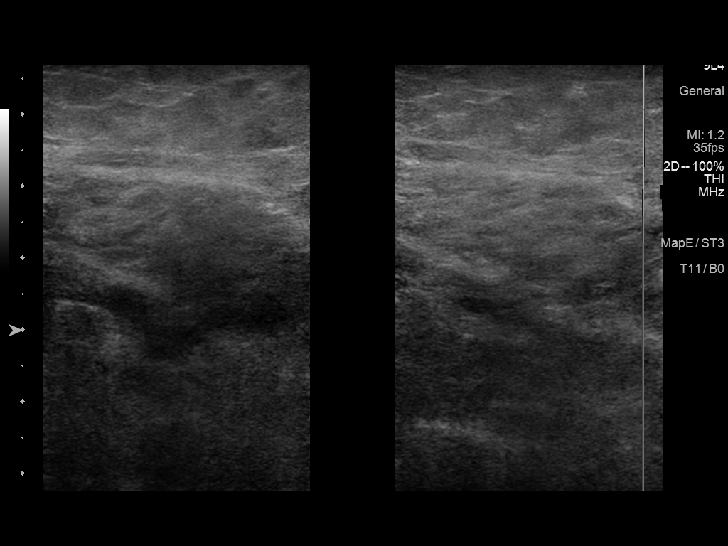
[im 14/32]
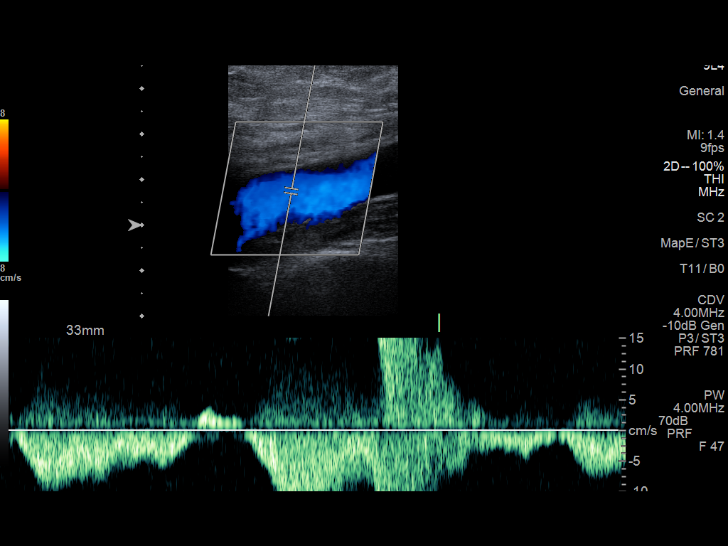
[im 17/32]
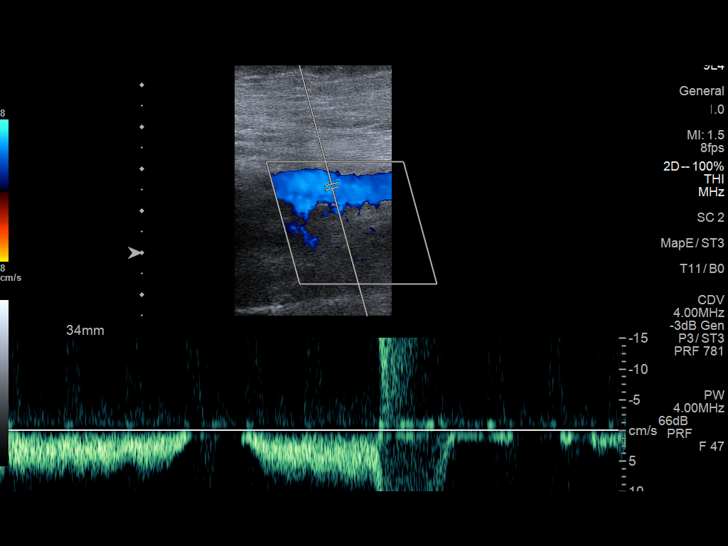
[im 18/32]
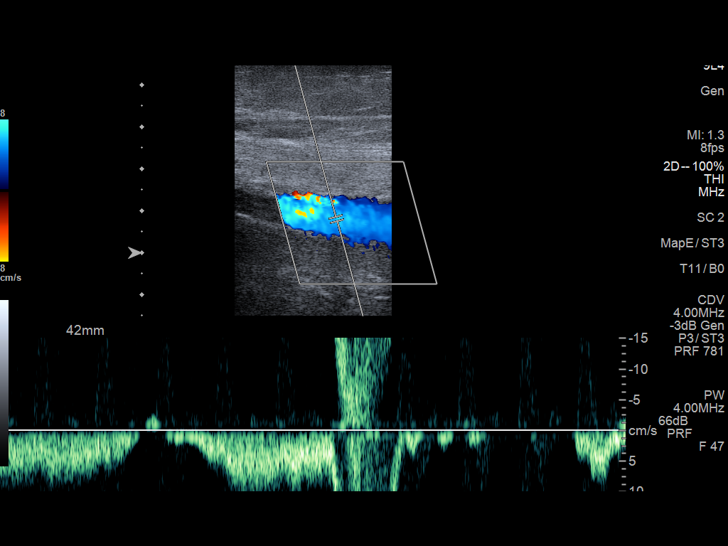
[im 21/32]
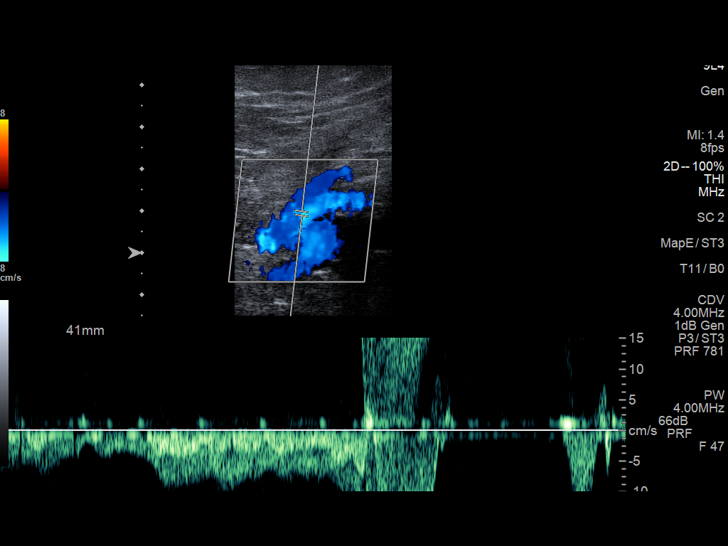
[im 23/32]
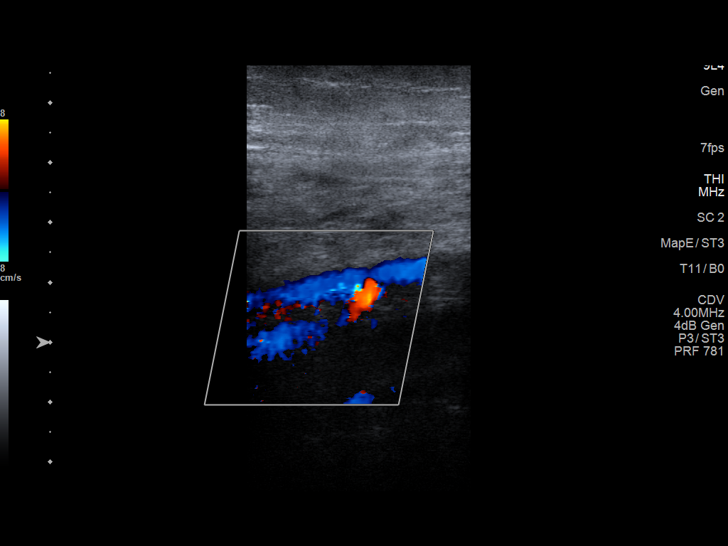
[im 26/32]
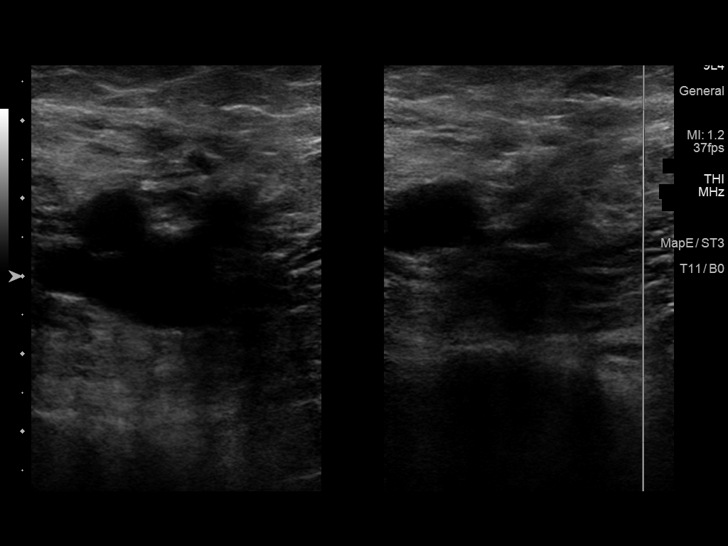
[im 29/32]
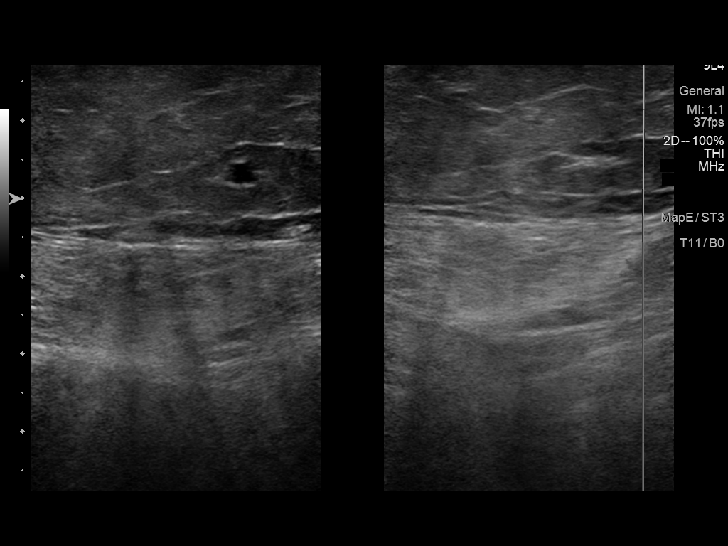
[im 32/32]
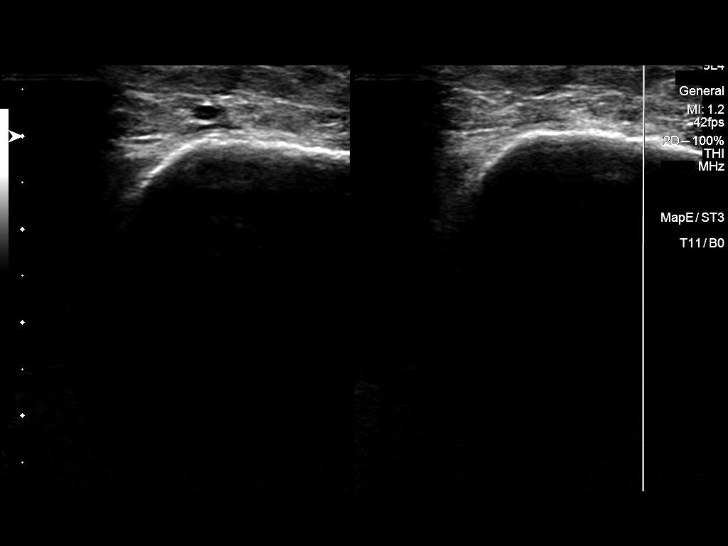

[13 of 24 positions shown; findings below may reference images not displayed]

FINDINGS: Contralateral Common Femoral Vein: Respiratory phasicity is normal
and symmetric with the symptomatic side. No evidence of thrombus.
Normal compressibility.

Common Femoral Vein: No evidence of thrombus. Normal
compressibility, respiratory phasicity and response to augmentation.

Saphenofemoral Junction: No evidence of thrombus. Normal
compressibility and flow on color Doppler imaging.

Profunda Femoral Vein: No evidence of thrombus. Normal
compressibility and flow on color Doppler imaging.

Femoral Vein: No evidence of thrombus. Normal compressibility,
respiratory phasicity and response to augmentation.

Popliteal Vein: No evidence of thrombus. Normal compressibility,
respiratory phasicity and response to augmentation.

Calf Veins: No evidence of thrombus. Normal compressibility and flow
on color Doppler imaging.

Superficial Great Saphenous Vein: No evidence of thrombus. Normal
compressibility.

Venous Reflux:  None.

Other Findings:  None.
IMPRESSION: No evidence of DVT within the right lower extremity.

## 2020-07-16 DIAGNOSIS — M349 Systemic sclerosis, unspecified: Secondary | ICD-10-CM | POA: Diagnosis not present

## 2020-07-16 DIAGNOSIS — M1990 Unspecified osteoarthritis, unspecified site: Secondary | ICD-10-CM | POA: Diagnosis not present

## 2020-07-16 DIAGNOSIS — M79643 Pain in unspecified hand: Secondary | ICD-10-CM | POA: Diagnosis not present

## 2020-07-16 DIAGNOSIS — Z79899 Other long term (current) drug therapy: Secondary | ICD-10-CM | POA: Diagnosis not present

## 2020-07-16 DIAGNOSIS — M0579 Rheumatoid arthritis with rheumatoid factor of multiple sites without organ or systems involvement: Secondary | ICD-10-CM | POA: Diagnosis not present

## 2020-07-16 DIAGNOSIS — M7989 Other specified soft tissue disorders: Secondary | ICD-10-CM | POA: Diagnosis not present

## 2020-07-16 DIAGNOSIS — R252 Cramp and spasm: Secondary | ICD-10-CM | POA: Diagnosis not present

## 2020-07-16 DIAGNOSIS — K760 Fatty (change of) liver, not elsewhere classified: Secondary | ICD-10-CM | POA: Diagnosis not present

## 2020-09-09 DIAGNOSIS — E1142 Type 2 diabetes mellitus with diabetic polyneuropathy: Secondary | ICD-10-CM | POA: Diagnosis not present

## 2020-09-09 DIAGNOSIS — I10 Essential (primary) hypertension: Secondary | ICD-10-CM | POA: Diagnosis not present

## 2020-09-09 DIAGNOSIS — E1165 Type 2 diabetes mellitus with hyperglycemia: Secondary | ICD-10-CM | POA: Diagnosis not present

## 2020-09-09 DIAGNOSIS — Z0000 Encounter for general adult medical examination without abnormal findings: Secondary | ICD-10-CM | POA: Diagnosis not present

## 2020-09-09 DIAGNOSIS — Z23 Encounter for immunization: Secondary | ICD-10-CM | POA: Diagnosis not present

## 2020-09-16 DIAGNOSIS — M0579 Rheumatoid arthritis with rheumatoid factor of multiple sites without organ or systems involvement: Secondary | ICD-10-CM | POA: Diagnosis not present

## 2020-09-16 DIAGNOSIS — R519 Headache, unspecified: Secondary | ICD-10-CM | POA: Diagnosis not present

## 2020-09-16 DIAGNOSIS — I10 Essential (primary) hypertension: Secondary | ICD-10-CM | POA: Diagnosis not present

## 2020-09-16 DIAGNOSIS — K760 Fatty (change of) liver, not elsewhere classified: Secondary | ICD-10-CM | POA: Diagnosis not present

## 2020-09-16 DIAGNOSIS — M341 CR(E)ST syndrome: Secondary | ICD-10-CM | POA: Diagnosis not present

## 2020-09-16 DIAGNOSIS — Z0000 Encounter for general adult medical examination without abnormal findings: Secondary | ICD-10-CM | POA: Diagnosis not present

## 2020-09-16 DIAGNOSIS — E1142 Type 2 diabetes mellitus with diabetic polyneuropathy: Secondary | ICD-10-CM | POA: Diagnosis not present

## 2020-09-16 DIAGNOSIS — M349 Systemic sclerosis, unspecified: Secondary | ICD-10-CM | POA: Diagnosis not present

## 2020-09-16 DIAGNOSIS — I700 Atherosclerosis of aorta: Secondary | ICD-10-CM | POA: Diagnosis not present

## 2020-09-16 DIAGNOSIS — E1165 Type 2 diabetes mellitus with hyperglycemia: Secondary | ICD-10-CM | POA: Diagnosis not present

## 2020-10-13 ENCOUNTER — Other Ambulatory Visit: Payer: Self-pay | Admitting: Internal Medicine

## 2020-10-13 DIAGNOSIS — R911 Solitary pulmonary nodule: Secondary | ICD-10-CM

## 2020-10-14 ENCOUNTER — Ambulatory Visit: Payer: Medicare HMO | Admitting: Podiatry

## 2020-10-14 ENCOUNTER — Other Ambulatory Visit: Payer: Self-pay

## 2020-10-14 DIAGNOSIS — L84 Corns and callosities: Secondary | ICD-10-CM

## 2020-10-14 DIAGNOSIS — E1142 Type 2 diabetes mellitus with diabetic polyneuropathy: Secondary | ICD-10-CM

## 2020-10-14 NOTE — Progress Notes (Signed)
  Subjective:  Patient ID: Diane [name], female    DOB: [birth year],  MRN: [id number]  Chief Complaint  Patient presents with  . Callouses    Bilateral plantar callouses  . Diabetes    Diabetic foot exam    74 y.o. female presents with the above complaint. History confirmed with patient. Reports paresthesias for which she takes Gabapentin  Objective:  Physical Exam: warm, good capillary refill, nail exam onychomycosis of the toenails, no trophic changes or ulcerative lesions. DP pulses palpable, PT pulses palpable and protective sensation intact Left Foot: HPK submet 5  Right Foot: HPK submet 1,5   No images are attached to the encounter.  Assessment:   1. DM type 2 with diabetic peripheral neuropathy (HCC)   2. Callus of foot      Plan:  Patient was evaluated and treated and all questions answered.  Diabetes and DPN (small fiber neuropathy) -Patient is diabetic with a qualifying condition for at risk foot care.  Procedure: Paring of Lesion Rationale: painful hyperkeratotic lesion Type of Debridement: manual, sharp debridement. Instrumentation: 312 blade Number of Lesions: 3  Return in about 3 months (around 01/12/2021) for Diabetic Foot Care.

## 2020-10-15 DIAGNOSIS — Z79899 Other long term (current) drug therapy: Secondary | ICD-10-CM | POA: Diagnosis not present

## 2020-10-15 DIAGNOSIS — R252 Cramp and spasm: Secondary | ICD-10-CM | POA: Diagnosis not present

## 2020-10-15 DIAGNOSIS — M0579 Rheumatoid arthritis with rheumatoid factor of multiple sites without organ or systems involvement: Secondary | ICD-10-CM | POA: Diagnosis not present

## 2020-10-15 DIAGNOSIS — M79643 Pain in unspecified hand: Secondary | ICD-10-CM | POA: Diagnosis not present

## 2020-10-15 DIAGNOSIS — K760 Fatty (change of) liver, not elsewhere classified: Secondary | ICD-10-CM | POA: Diagnosis not present

## 2020-10-15 DIAGNOSIS — M1990 Unspecified osteoarthritis, unspecified site: Secondary | ICD-10-CM | POA: Diagnosis not present

## 2020-10-15 DIAGNOSIS — M7989 Other specified soft tissue disorders: Secondary | ICD-10-CM | POA: Diagnosis not present

## 2020-10-15 DIAGNOSIS — M349 Systemic sclerosis, unspecified: Secondary | ICD-10-CM | POA: Diagnosis not present

## 2020-10-30 ENCOUNTER — Other Ambulatory Visit: Payer: Medicare HMO

## 2020-11-13 DIAGNOSIS — M542 Cervicalgia: Secondary | ICD-10-CM | POA: Diagnosis not present

## 2020-11-13 DIAGNOSIS — M25511 Pain in right shoulder: Secondary | ICD-10-CM | POA: Diagnosis not present

## 2020-11-21 ENCOUNTER — Ambulatory Visit
Admission: RE | Admit: 2020-11-21 | Discharge: 2020-11-21 | Disposition: A | Discharge status: Home / Self Care | Payer: Medicare HMO | Source: Ambulatory Visit | Attending: Internal Medicine | Admitting: Internal Medicine

## 2020-11-21 DIAGNOSIS — I700 Atherosclerosis of aorta: Secondary | ICD-10-CM | POA: Diagnosis not present

## 2020-11-21 DIAGNOSIS — R911 Solitary pulmonary nodule: Secondary | ICD-10-CM

## 2020-11-21 DIAGNOSIS — R918 Other nonspecific abnormal finding of lung field: Secondary | ICD-10-CM | POA: Diagnosis not present

## 2020-11-21 DIAGNOSIS — I2510 Atherosclerotic heart disease of native coronary artery without angina pectoris: Secondary | ICD-10-CM | POA: Diagnosis not present

## 2020-11-21 IMAGING — CT CT CHEST W/O CM
2 of 4 series · 15 of 36 positions shown, 18 images · non-contrast
Comparison: 04/02/2020

CLINICAL DATA: Fall right upper lobe airspace opacity.

EXAM:
CT CHEST WITHOUT CONTRAST
TECHNIQUE: Multidetector CT imaging of the chest was performed following the
standard protocol without IV contrast.

[Series 2: chest 2.00 br40 s3 · axial · 0.72mm/px · z∈[+1625,+1867]mm · 12 of 143 slices shown, 15 images (1 of 2)]
[im 11/143  mediastinal]
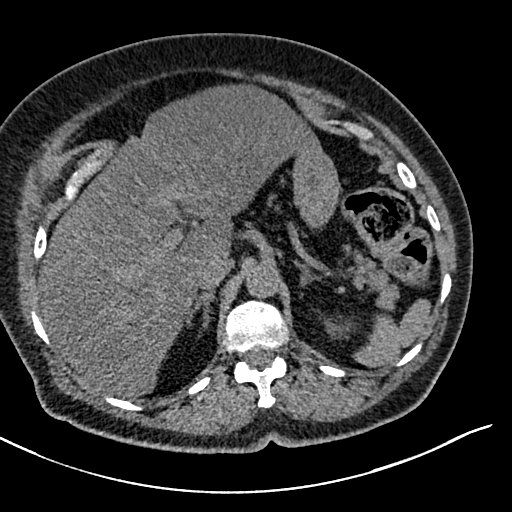
[im 11/143  lung]
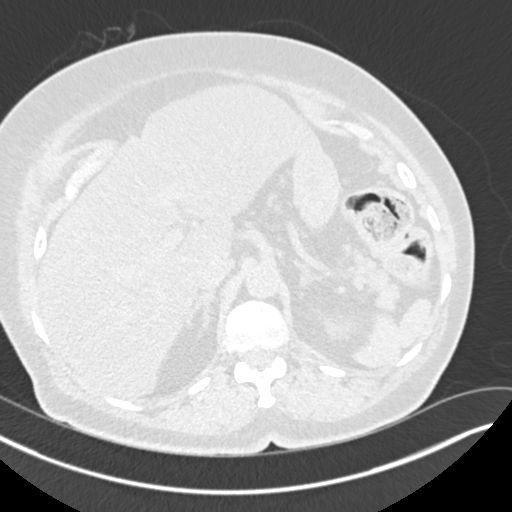
[im 22/143  lung]
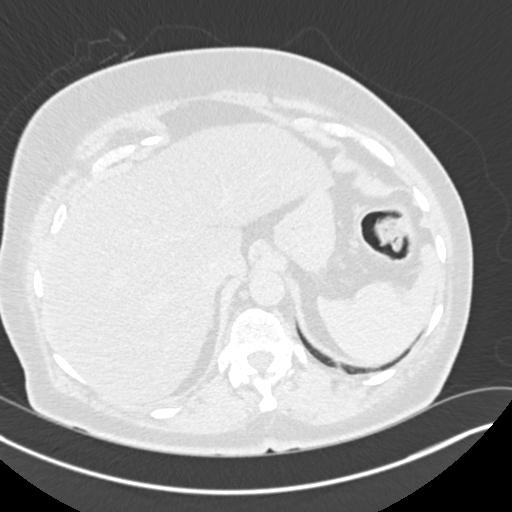
[im 33/143  lung]
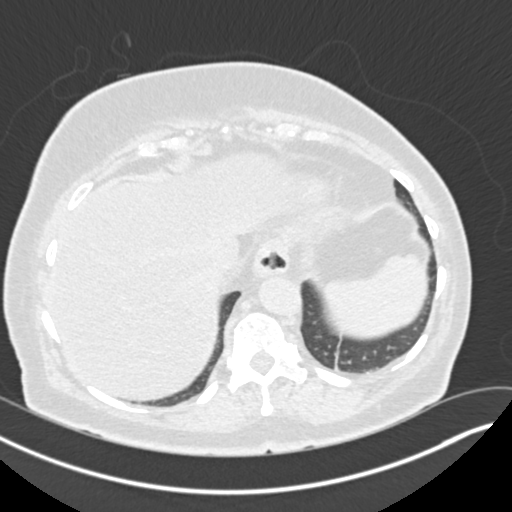
[im 44/143  lung]
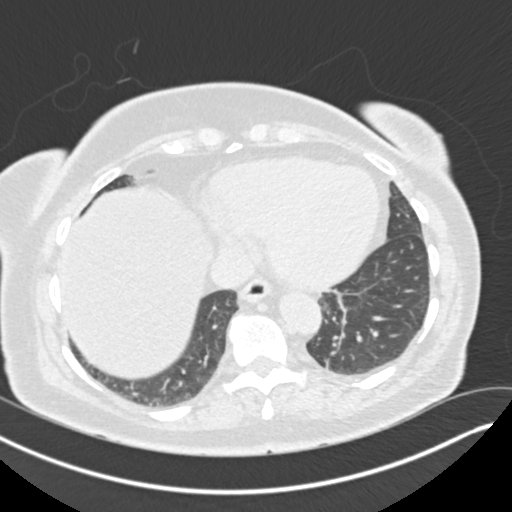
[im 55/143  mediastinal]
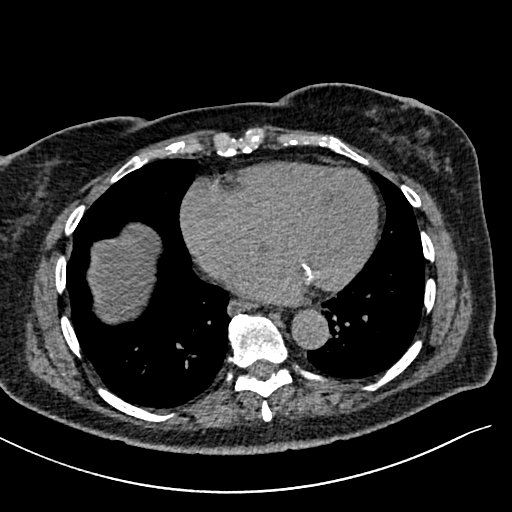
[im 55/143  lung]
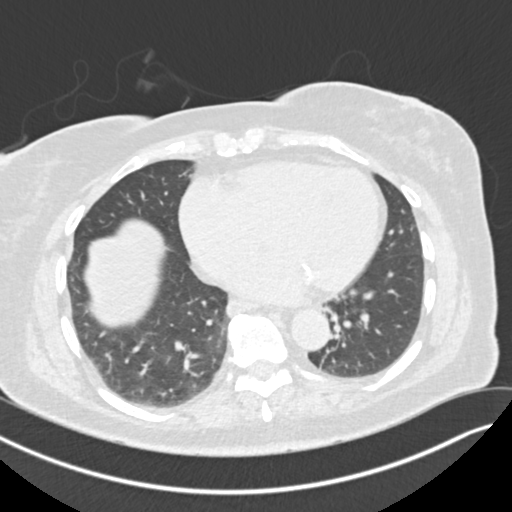
[im 66/143  lung]
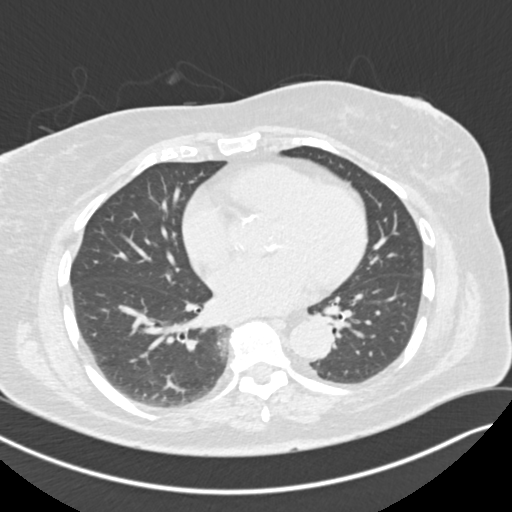
[im 77/143  lung]
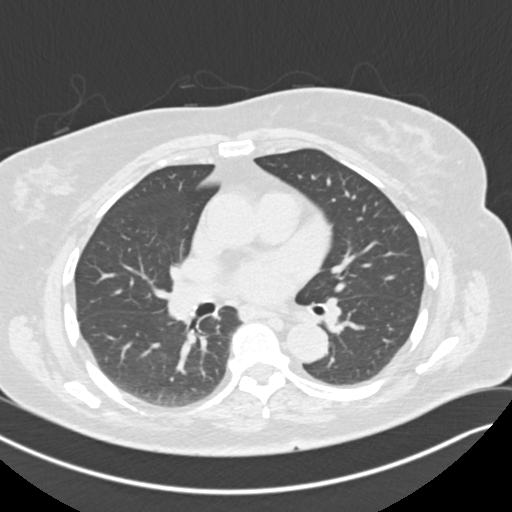
[im 88/143  lung]
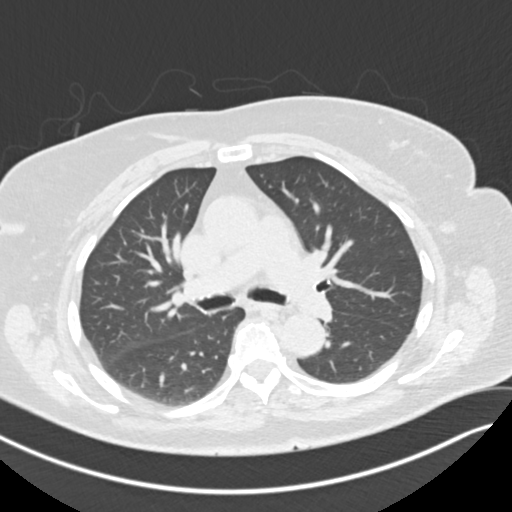
[im 99/143  mediastinal]
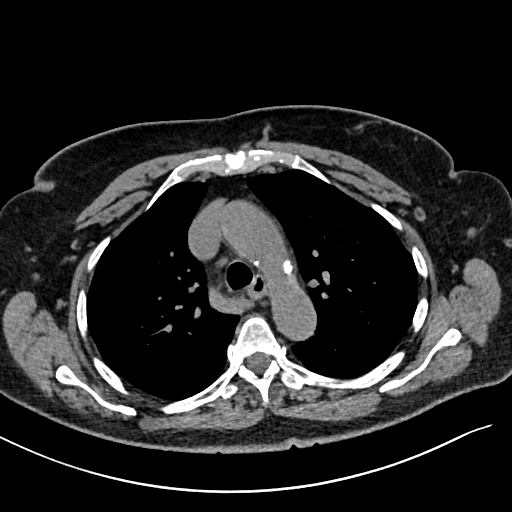
[im 99/143  lung]
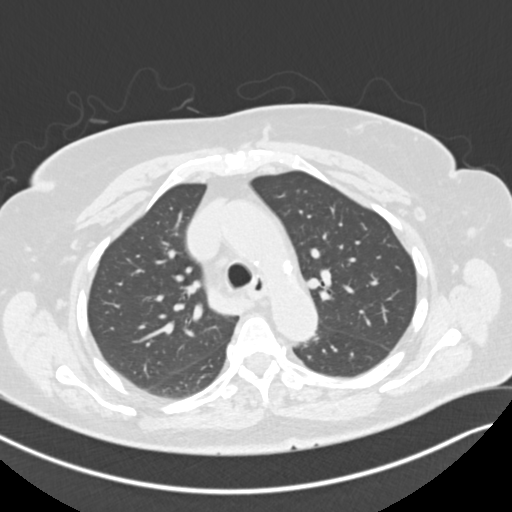
[im 110/143  lung]
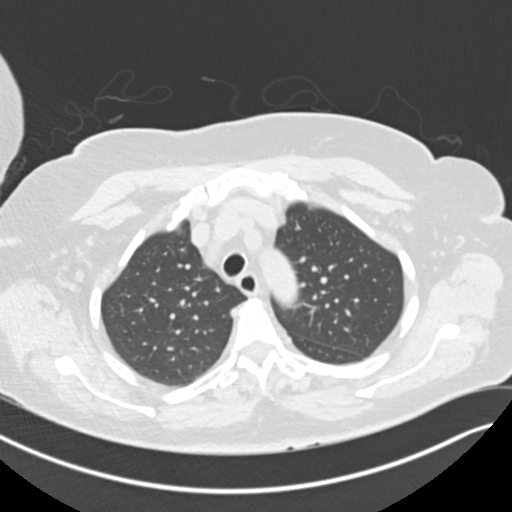
[im 121/143  lung]
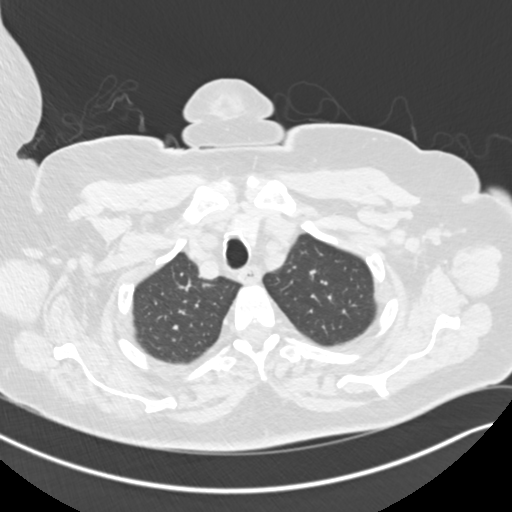
[im 132/143  lung]
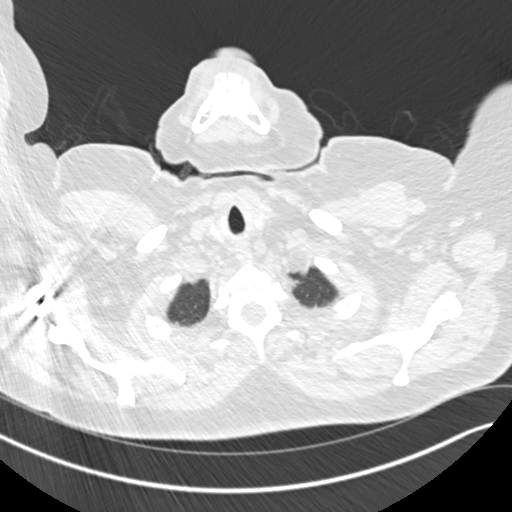

[Series 4: chest 2.00 br40 s3 · coronal · 0.56mm/px · 3 of 184 slices shown (2 of 2)]
[im 37/184  lung]
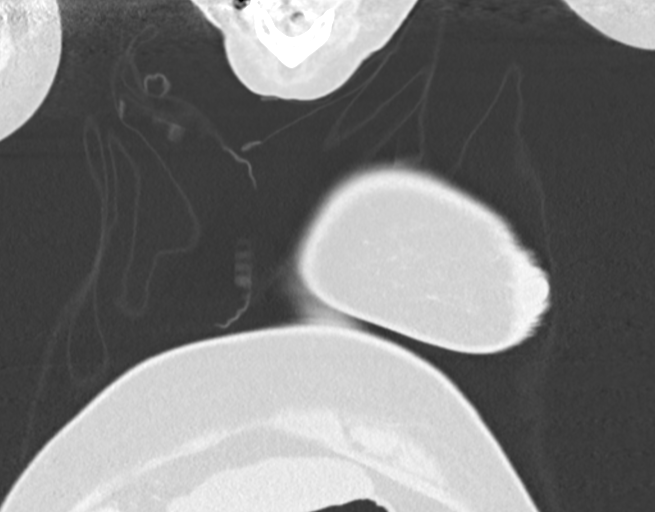
[im 74/184  lung]
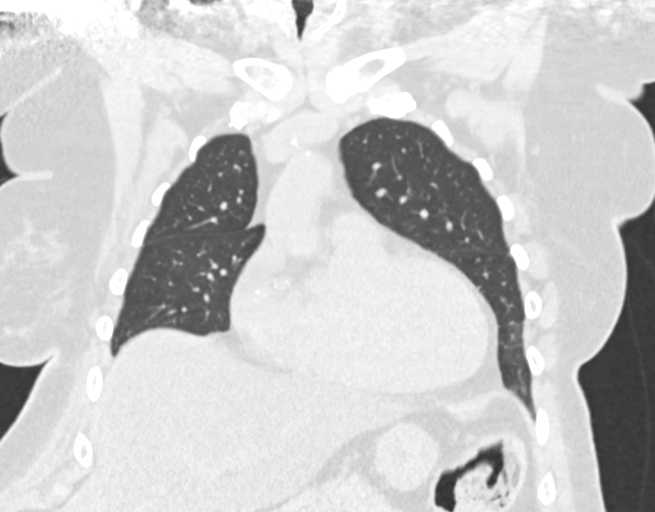
[im 110/184  lung]
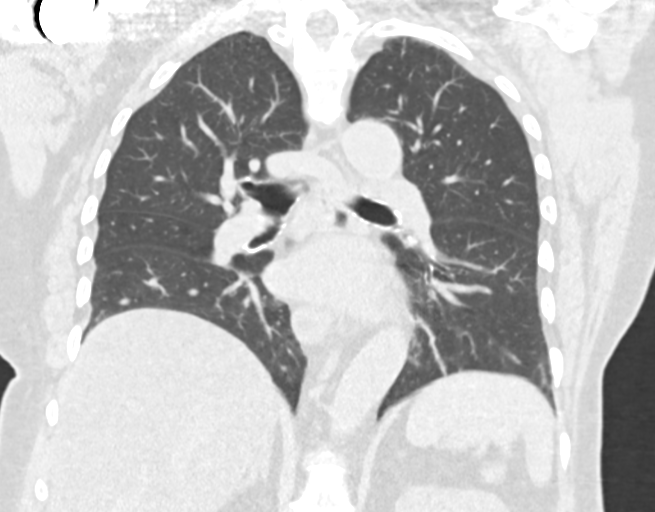

[15 of 36 positions shown; findings below may reference images not displayed]

FINDINGS: Cardiovascular: No significant vascular findings. Normal heart size.
No pericardial effusion. Thoracic aortic atherosclerosis. Coronary
artery atherosclerosis.

Mediastinum/Nodes: No enlarged mediastinal or axillary lymph nodes.
Thyroid gland, trachea, and esophagus demonstrate no significant
findings.

Lungs/Pleura: Interval resolution of right upper lobe airspace
disease. Mild left basilar scarring versus atelectasis. No pleural
effusion or pneumothorax.

Upper Abdomen: No acute upper abdominal abnormality. Diffuse low
attenuation of the liver as can be seen with hepatic steatosis.

Musculoskeletal: No acute osseous abnormality. No aggressive osseous
lesion.
IMPRESSION: 1. Interval resolution of right upper lobe airspace disease.
2. Hepatic steatosis.
3. Aortic Atherosclerosis ([id number]).

## 2020-11-26 DIAGNOSIS — M25511 Pain in right shoulder: Secondary | ICD-10-CM | POA: Diagnosis not present

## 2020-12-01 ENCOUNTER — Other Ambulatory Visit: Payer: Self-pay | Admitting: Internal Medicine

## 2020-12-01 DIAGNOSIS — Z1231 Encounter for screening mammogram for malignant neoplasm of breast: Secondary | ICD-10-CM

## 2021-01-06 DIAGNOSIS — E1142 Type 2 diabetes mellitus with diabetic polyneuropathy: Secondary | ICD-10-CM | POA: Diagnosis not present

## 2021-01-06 DIAGNOSIS — I700 Atherosclerosis of aorta: Secondary | ICD-10-CM | POA: Diagnosis not present

## 2021-01-06 DIAGNOSIS — E1165 Type 2 diabetes mellitus with hyperglycemia: Secondary | ICD-10-CM | POA: Diagnosis not present

## 2021-01-13 ENCOUNTER — Ambulatory Visit: Payer: Medicare HMO | Admitting: Podiatry

## 2021-01-13 DIAGNOSIS — E782 Mixed hyperlipidemia: Secondary | ICD-10-CM | POA: Diagnosis not present

## 2021-01-13 DIAGNOSIS — R600 Localized edema: Secondary | ICD-10-CM | POA: Diagnosis not present

## 2021-01-13 DIAGNOSIS — K760 Fatty (change of) liver, not elsewhere classified: Secondary | ICD-10-CM | POA: Diagnosis not present

## 2021-01-13 DIAGNOSIS — L309 Dermatitis, unspecified: Secondary | ICD-10-CM | POA: Diagnosis not present

## 2021-01-13 DIAGNOSIS — I872 Venous insufficiency (chronic) (peripheral): Secondary | ICD-10-CM | POA: Diagnosis not present

## 2021-01-13 DIAGNOSIS — E1165 Type 2 diabetes mellitus with hyperglycemia: Secondary | ICD-10-CM | POA: Diagnosis not present

## 2021-01-13 DIAGNOSIS — R21 Rash and other nonspecific skin eruption: Secondary | ICD-10-CM | POA: Diagnosis not present

## 2021-01-13 DIAGNOSIS — I10 Essential (primary) hypertension: Secondary | ICD-10-CM | POA: Diagnosis not present

## 2021-01-14 ENCOUNTER — Inpatient Hospital Stay: Admission: RE | Admit: 2021-01-14 | Payer: Medicare Other | Source: Ambulatory Visit

## 2021-01-14 ENCOUNTER — Other Ambulatory Visit: Payer: Self-pay

## 2021-01-14 ENCOUNTER — Ambulatory Visit
Admission: RE | Admit: 2021-01-14 | Discharge: 2021-01-14 | Disposition: A | Discharge status: Home / Self Care | Payer: Medicare Other | Source: Ambulatory Visit | Attending: Internal Medicine | Admitting: Internal Medicine

## 2021-01-14 DIAGNOSIS — Z1231 Encounter for screening mammogram for malignant neoplasm of breast: Secondary | ICD-10-CM | POA: Diagnosis not present

## 2021-01-14 IMAGING — MG MM DIGITAL SCREENING BILAT W/ TOMO AND CAD
6 of 10 series · 6 of 30 positions shown · non-contrast
Comparison: Previous exam(s).

CLINICAL DATA: Screening.

EXAM:
DIGITAL SCREENING BILATERAL MAMMOGRAM WITH TOMOSYNTHESIS AND CAD
TECHNIQUE: Bilateral screening digital craniocaudal and mediolateral oblique
mammograms were obtained. Bilateral screening digital breast
tomosynthesis was performed. The images were evaluated with
computer-aided detection.

[L MLO synth-2D (1 of 2)]
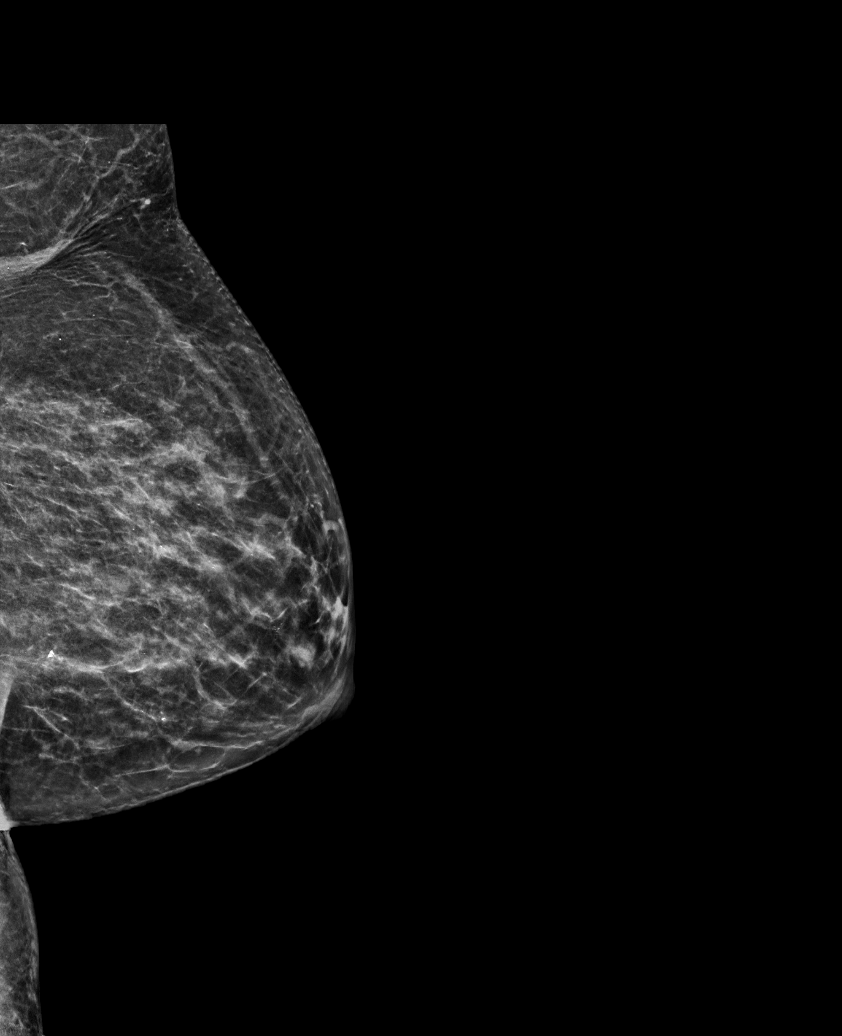

[L MLO synth-2D (2 of 2)]
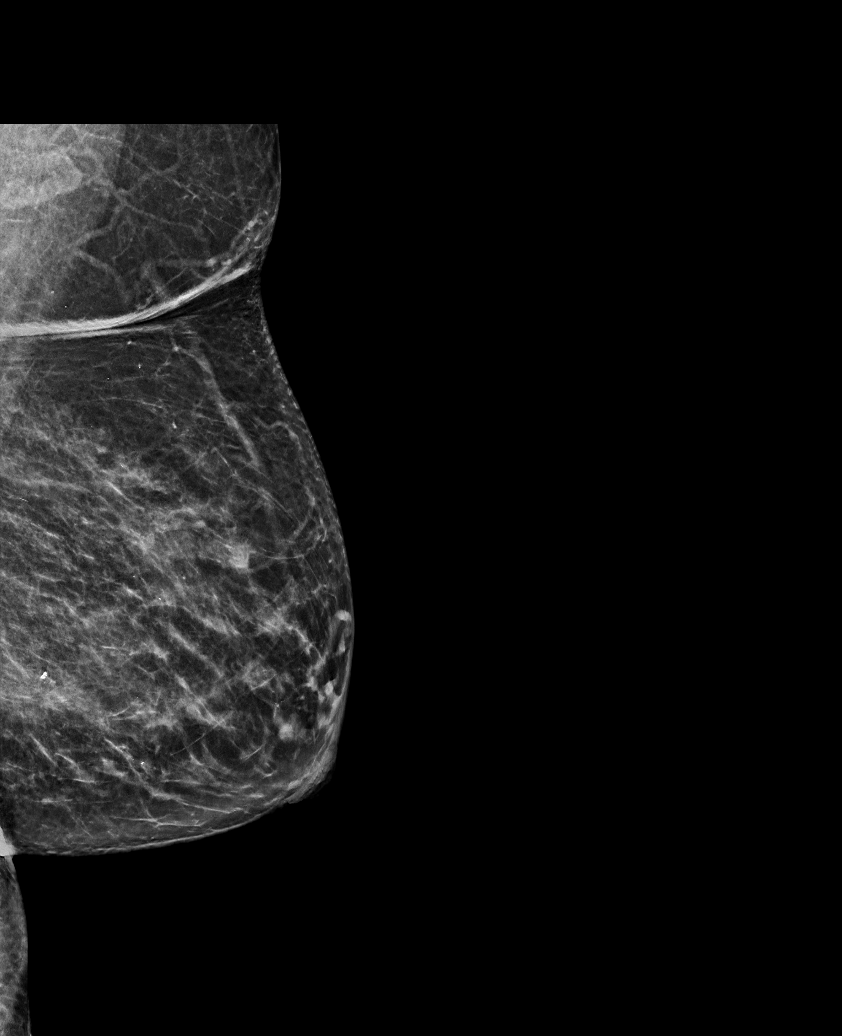

[R MLO synth-2D]
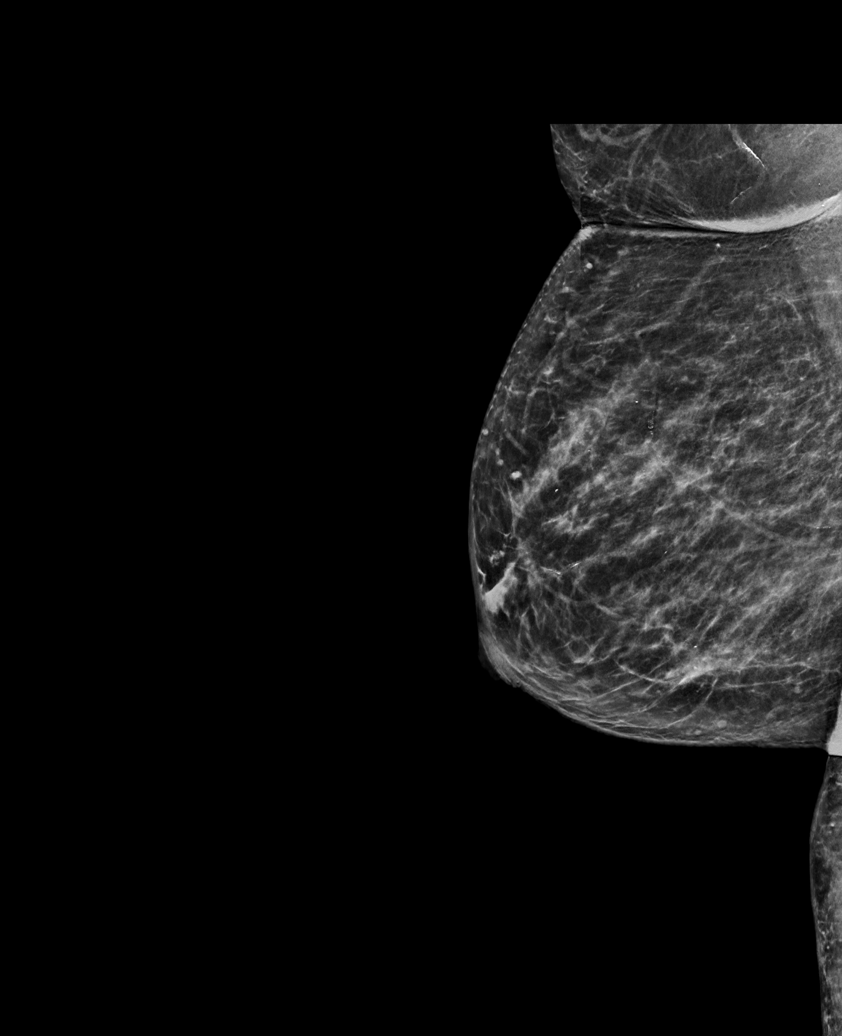

[L CC synth-2D]
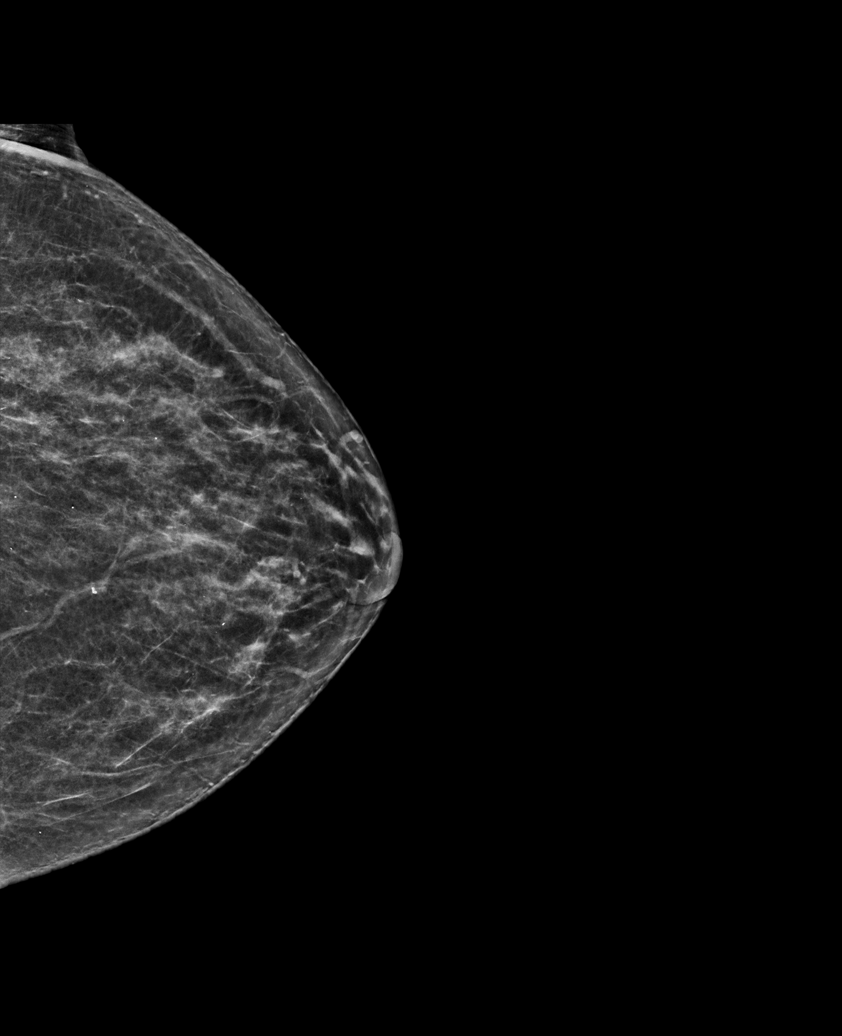

[R CC synth-2D]
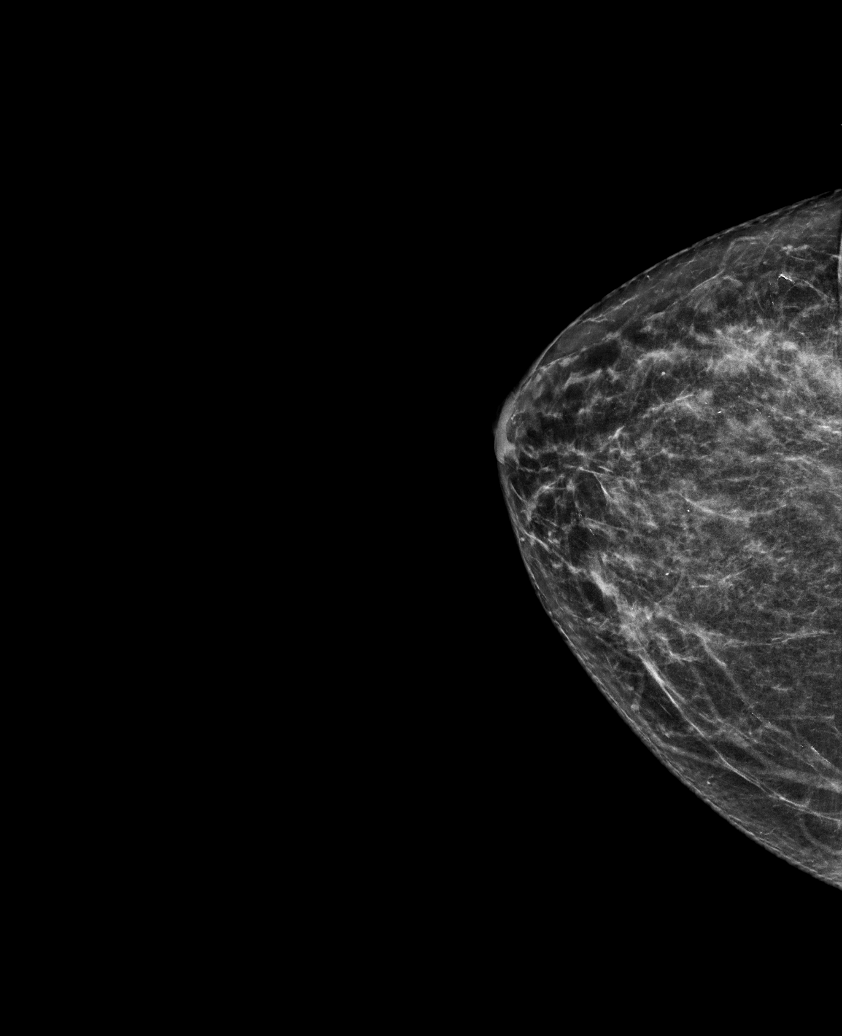

[L MLO tomo · tomo slice 35/70.0]
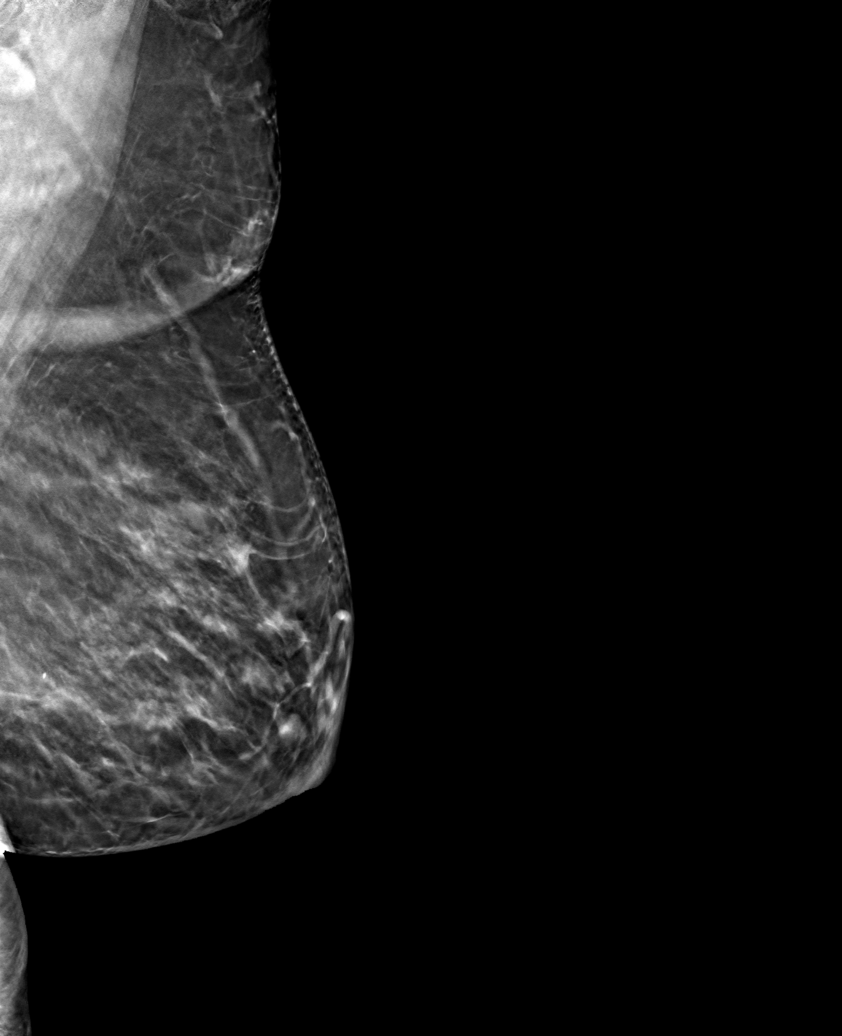

[6 of 30 positions shown; findings below may reference images not displayed]

ACR Breast Density Category c: The breast tissue is heterogeneously
dense, which may obscure small masses.
FINDINGS: There are no findings suspicious for malignancy. The images were
evaluated with computer-aided detection.
IMPRESSION: No mammographic evidence of malignancy. A result letter of this
screening mammogram will be mailed directly to the patient.

RECOMMENDATION:
Screening mammogram in one year. (Code:T4-5-GWO)

BI-RADS CATEGORY  1: Negative.

## 2021-01-22 ENCOUNTER — Inpatient Hospital Stay (HOSPITAL_BASED_OUTPATIENT_CLINIC_OR_DEPARTMENT_OTHER)
Admission: EM | Admit: 2021-01-22 | Discharge: 2021-01-30 | DRG: 388 | Disposition: A | Discharge status: Home Health | Payer: Medicare Other | Attending: Internal Medicine | Admitting: Internal Medicine

## 2021-01-22 ENCOUNTER — Emergency Department (HOSPITAL_BASED_OUTPATIENT_CLINIC_OR_DEPARTMENT_OTHER): Payer: Medicare Other

## 2021-01-22 ENCOUNTER — Other Ambulatory Visit: Payer: Self-pay

## 2021-01-22 ENCOUNTER — Encounter (HOSPITAL_BASED_OUTPATIENT_CLINIC_OR_DEPARTMENT_OTHER): Payer: Self-pay

## 2021-01-22 DIAGNOSIS — F419 Anxiety disorder, unspecified: Secondary | ICD-10-CM | POA: Diagnosis present

## 2021-01-22 DIAGNOSIS — Z96611 Presence of right artificial shoulder joint: Secondary | ICD-10-CM | POA: Diagnosis present

## 2021-01-22 DIAGNOSIS — J9811 Atelectasis: Secondary | ICD-10-CM | POA: Diagnosis not present

## 2021-01-22 DIAGNOSIS — Z794 Long term (current) use of insulin: Secondary | ICD-10-CM

## 2021-01-22 DIAGNOSIS — R413 Other amnesia: Secondary | ICD-10-CM | POA: Diagnosis present

## 2021-01-22 DIAGNOSIS — E870 Hyperosmolality and hypernatremia: Secondary | ICD-10-CM | POA: Diagnosis not present

## 2021-01-22 DIAGNOSIS — R4189 Other symptoms and signs involving cognitive functions and awareness: Secondary | ICD-10-CM | POA: Diagnosis present

## 2021-01-22 DIAGNOSIS — E669 Obesity, unspecified: Secondary | ICD-10-CM | POA: Diagnosis present

## 2021-01-22 DIAGNOSIS — K5669 Other partial intestinal obstruction: Principal | ICD-10-CM | POA: Diagnosis present

## 2021-01-22 DIAGNOSIS — Z6832 Body mass index (BMI) 32.0-32.9, adult: Secondary | ICD-10-CM | POA: Diagnosis not present

## 2021-01-22 DIAGNOSIS — J9690 Respiratory failure, unspecified, unspecified whether with hypoxia or hypercapnia: Secondary | ICD-10-CM | POA: Diagnosis not present

## 2021-01-22 DIAGNOSIS — J9601 Acute respiratory failure with hypoxia: Secondary | ICD-10-CM | POA: Diagnosis not present

## 2021-01-22 DIAGNOSIS — K219 Gastro-esophageal reflux disease without esophagitis: Secondary | ICD-10-CM | POA: Diagnosis present

## 2021-01-22 DIAGNOSIS — E1140 Type 2 diabetes mellitus with diabetic neuropathy, unspecified: Secondary | ICD-10-CM | POA: Diagnosis not present

## 2021-01-22 DIAGNOSIS — Z452 Encounter for adjustment and management of vascular access device: Secondary | ICD-10-CM | POA: Diagnosis not present

## 2021-01-22 DIAGNOSIS — E1142 Type 2 diabetes mellitus with diabetic polyneuropathy: Secondary | ICD-10-CM | POA: Diagnosis present

## 2021-01-22 DIAGNOSIS — E1165 Type 2 diabetes mellitus with hyperglycemia: Secondary | ICD-10-CM | POA: Diagnosis present

## 2021-01-22 DIAGNOSIS — E872 Acidosis: Secondary | ICD-10-CM | POA: Diagnosis present

## 2021-01-22 DIAGNOSIS — I4581 Long QT syndrome: Secondary | ICD-10-CM | POA: Diagnosis not present

## 2021-01-22 DIAGNOSIS — E860 Dehydration: Secondary | ICD-10-CM | POA: Diagnosis not present

## 2021-01-22 DIAGNOSIS — N179 Acute kidney failure, unspecified: Secondary | ICD-10-CM

## 2021-01-22 DIAGNOSIS — R1110 Vomiting, unspecified: Secondary | ICD-10-CM | POA: Diagnosis not present

## 2021-01-22 DIAGNOSIS — R000 Tachycardia, unspecified: Secondary | ICD-10-CM | POA: Diagnosis not present

## 2021-01-22 DIAGNOSIS — R933 Abnormal findings on diagnostic imaging of other parts of digestive tract: Secondary | ICD-10-CM | POA: Diagnosis not present

## 2021-01-22 DIAGNOSIS — Z885 Allergy status to narcotic agent status: Secondary | ICD-10-CM

## 2021-01-22 DIAGNOSIS — K631 Perforation of intestine (nontraumatic): Secondary | ICD-10-CM | POA: Diagnosis present

## 2021-01-22 DIAGNOSIS — Z20822 Contact with and (suspected) exposure to covid-19: Secondary | ICD-10-CM | POA: Diagnosis not present

## 2021-01-22 DIAGNOSIS — Z90710 Acquired absence of both cervix and uterus: Secondary | ICD-10-CM

## 2021-01-22 DIAGNOSIS — Z4682 Encounter for fitting and adjustment of non-vascular catheter: Secondary | ICD-10-CM | POA: Diagnosis not present

## 2021-01-22 DIAGNOSIS — R2681 Unsteadiness on feet: Secondary | ICD-10-CM | POA: Diagnosis not present

## 2021-01-22 DIAGNOSIS — E876 Hypokalemia: Secondary | ICD-10-CM | POA: Diagnosis present

## 2021-01-22 DIAGNOSIS — Z7982 Long term (current) use of aspirin: Secondary | ICD-10-CM

## 2021-01-22 DIAGNOSIS — M1990 Unspecified osteoarthritis, unspecified site: Secondary | ICD-10-CM | POA: Diagnosis present

## 2021-01-22 DIAGNOSIS — R9431 Abnormal electrocardiogram [ECG] [EKG]: Secondary | ICD-10-CM | POA: Diagnosis present

## 2021-01-22 DIAGNOSIS — I10 Essential (primary) hypertension: Secondary | ICD-10-CM | POA: Diagnosis not present

## 2021-01-22 DIAGNOSIS — R1084 Generalized abdominal pain: Secondary | ICD-10-CM | POA: Diagnosis not present

## 2021-01-22 DIAGNOSIS — F32A Depression, unspecified: Secondary | ICD-10-CM | POA: Diagnosis present

## 2021-01-22 DIAGNOSIS — M069 Rheumatoid arthritis, unspecified: Secondary | ICD-10-CM | POA: Diagnosis not present

## 2021-01-22 DIAGNOSIS — I2510 Atherosclerotic heart disease of native coronary artery without angina pectoris: Secondary | ICD-10-CM | POA: Diagnosis not present

## 2021-01-22 DIAGNOSIS — G629 Polyneuropathy, unspecified: Secondary | ICD-10-CM

## 2021-01-22 DIAGNOSIS — R109 Unspecified abdominal pain: Secondary | ICD-10-CM | POA: Diagnosis not present

## 2021-01-22 DIAGNOSIS — G63 Polyneuropathy in diseases classified elsewhere: Secondary | ICD-10-CM | POA: Diagnosis not present

## 2021-01-22 DIAGNOSIS — K56609 Unspecified intestinal obstruction, unspecified as to partial versus complete obstruction: Secondary | ICD-10-CM

## 2021-01-22 DIAGNOSIS — R531 Weakness: Secondary | ICD-10-CM | POA: Diagnosis not present

## 2021-01-22 DIAGNOSIS — Z9049 Acquired absence of other specified parts of digestive tract: Secondary | ICD-10-CM

## 2021-01-22 DIAGNOSIS — K56600 Partial intestinal obstruction, unspecified as to cause: Secondary | ICD-10-CM | POA: Diagnosis not present

## 2021-01-22 DIAGNOSIS — M2681 Anterior soft tissue impingement: Secondary | ICD-10-CM | POA: Diagnosis not present

## 2021-01-22 DIAGNOSIS — Z888 Allergy status to other drugs, medicaments and biological substances status: Secondary | ICD-10-CM | POA: Diagnosis not present

## 2021-01-22 DIAGNOSIS — Z79899 Other long term (current) drug therapy: Secondary | ICD-10-CM

## 2021-01-22 LAB — COMPREHENSIVE METABOLIC PANEL
ALT: 16 U/L (ref 0–44)
AST: 22 U/L (ref 15–41)
Albumin: 3.9 g/dL (ref 3.5–5.0)
Alkaline Phosphatase: 55 U/L (ref 38–126)
Anion gap: 10 (ref 5–15)
BUN: 8 mg/dL (ref 8–23)
CO2: 27 mmol/L (ref 22–32)
Calcium: 9.1 mg/dL (ref 8.9–10.3)
Chloride: 100 mmol/L (ref 98–111)
Creatinine, Ser: 0.6 mg/dL (ref 0.44–1.00)
GFR, Estimated: >60 mL/min (ref >60)
Glucose, Bld: 225 mg/dL — ABNORMAL HIGH (ref 70–99)
Potassium: 3.9 mmol/L (ref 3.5–5.1)
Sodium: 137 mmol/L (ref 135–145)
Total Bilirubin: 0.4 mg/dL (ref 0.3–1.2)
Total Protein: 7.8 g/dL (ref 6.5–8.1)

## 2021-01-22 LAB — HEMOGLOBIN A1C
Hgb A1c MFr Bld: 7.3 % — ABNORMAL HIGH (ref 4.8–5.6)
Mean Plasma Glucose: 162.81 mg/dL

## 2021-01-22 LAB — CBC WITH DIFFERENTIAL/PLATELET
Abs Immature Granulocytes: 0.03 10*3/uL (ref 0.00–0.07)
Basophils Absolute: 0 10*3/uL (ref 0.0–0.1)
Basophils Relative: 0 %
Eosinophils Absolute: 0 10*3/uL (ref 0.0–0.5)
Eosinophils Relative: 0 %
HCT: 44.1 % (ref 36.0–46.0)
Hemoglobin: 14.3 g/dL (ref 12.0–15.0)
Immature Granulocytes: 0 %
Lymphocytes Relative: 10 %
Lymphs Abs: 0.8 10*3/uL (ref 0.7–4.0)
MCH: 29.4 pg (ref 26.0–34.0)
MCHC: 32.4 g/dL (ref 30.0–36.0)
MCV: 90.6 fL (ref 80.0–100.0)
Monocytes Absolute: 0.5 10*3/uL (ref 0.1–1.0)
Monocytes Relative: 6 %
Neutro Abs: 7.2 10*3/uL (ref 1.7–7.7)
Neutrophils Relative %: 84 %
Platelets: 232 10*3/uL (ref 150–400)
RBC: 4.87 MIL/uL (ref 3.87–5.11)
RDW: 14.6 % (ref 11.5–15.5)
WBC: 8.6 10*3/uL (ref 4.0–10.5)
nRBC: 0 % (ref 0.0–0.2)

## 2021-01-22 LAB — RESP PANEL BY RT-PCR (FLU A&B, COVID) ARPGX2
Influenza A by PCR: NEGATIVE
Influenza B by PCR: NEGATIVE
SARS Coronavirus 2 by RT PCR: NEGATIVE

## 2021-01-22 LAB — GLUCOSE, CAPILLARY: Glucose-Capillary: 251 mg/dL — ABNORMAL HIGH (ref 70–99)

## 2021-01-22 LAB — LIPASE, BLOOD: Lipase: 42 U/L (ref 11–51)

## 2021-01-22 IMAGING — CT CT ABD-PELV W/ CM
2 of 8 series · 14 of 46 positions shown, 18 images · IV contrast (Omnipaque)
Comparison: 04/02/2020

CLINICAL DATA: Nausea, vomiting, mid upper abdominal pain since
last night, history hypertension, diabetes mellitus, GERD,
hysterectomy, cholecystectomy, umbilical hernia repair

EXAM:
CT ABDOMEN AND PELVIS WITH CONTRAST
TECHNIQUE: Multidetector CT imaging of the abdomen and pelvis was performed
using the standard protocol following bolus administration of
intravenous contrast. Sagittal and coronal MPR images reconstructed
from axial data set.
CONTRAST:  100mL OMNIPAQUE IOHEXOL 300 MG/ML SOLN IV. No oral
contrast.

[Series 2: axial st · axial · 0.93mm/px · z∈[-359,+16]mm · 11 of 86 slices shown, 15 images]
[im 6/86  soft-tissue]
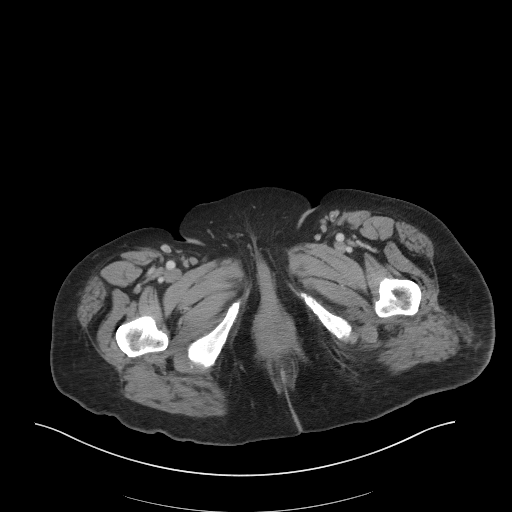
[im 6/86  bone]
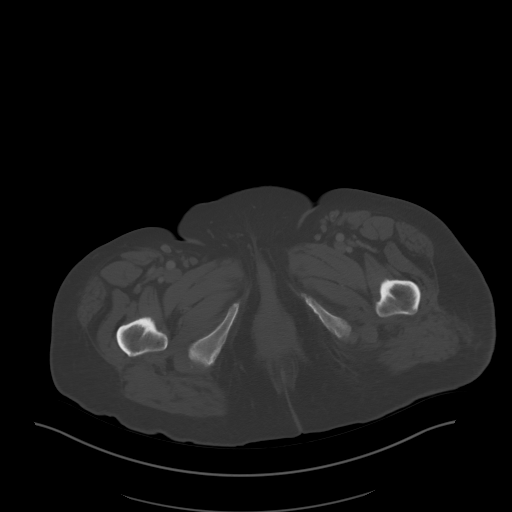
[im 16/86  soft-tissue]
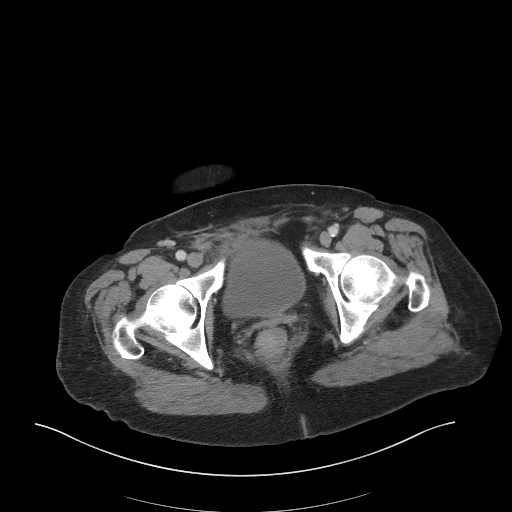
[im 26/86  soft-tissue]
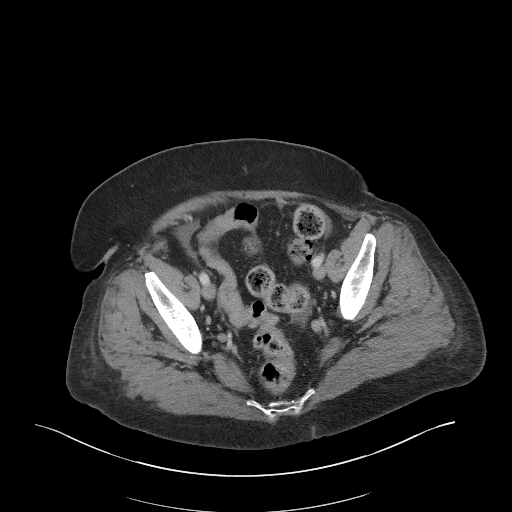
[im 36/86  soft-tissue]
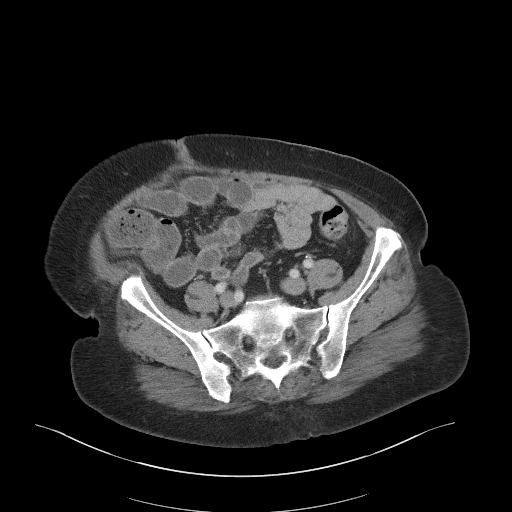
[im 46/86  soft-tissue]
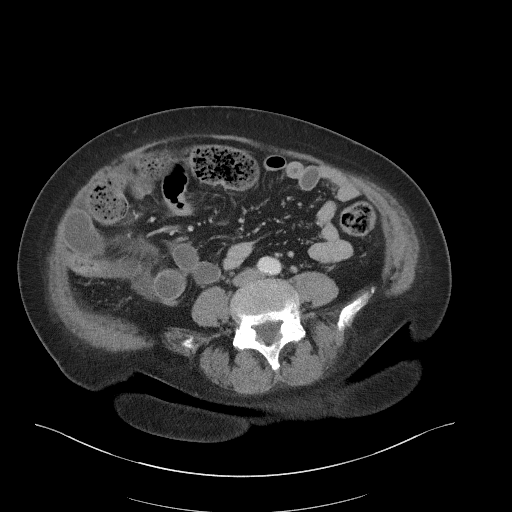
[im 51/86  soft-tissue]
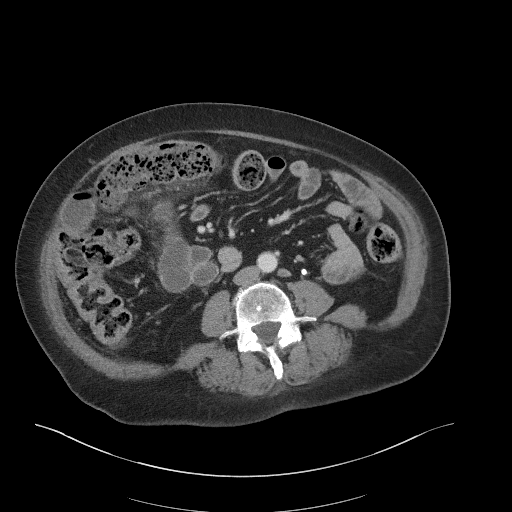
[im 61/86  soft-tissue]
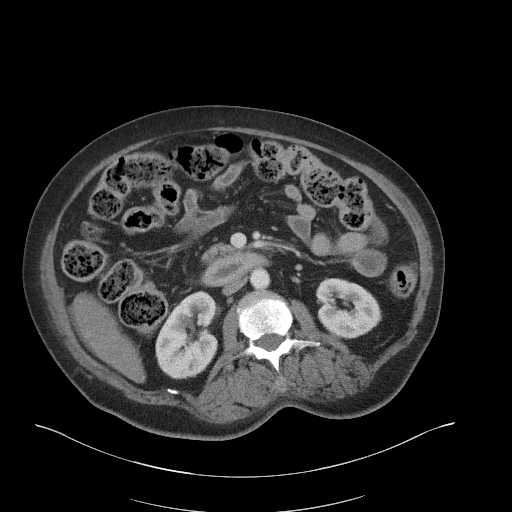
[im 66/86  lung]
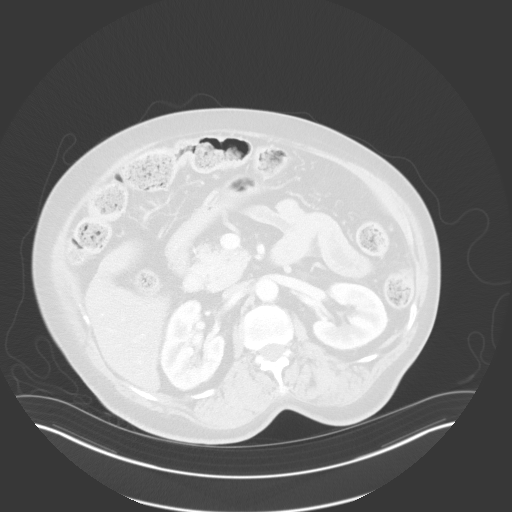
[im 71/86  soft-tissue]
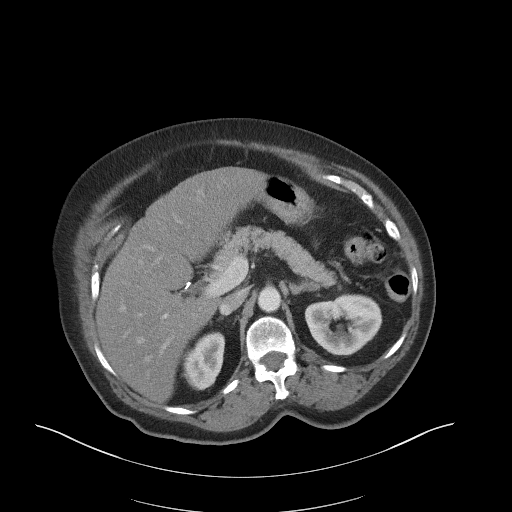
[im 71/86  lung]
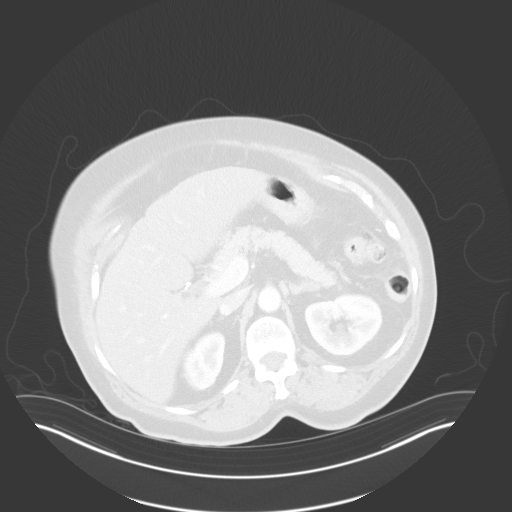
[im 76/86  lung]
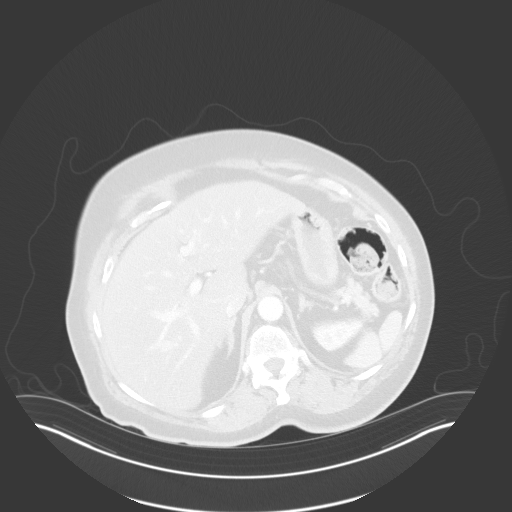
[im 81/86  soft-tissue]
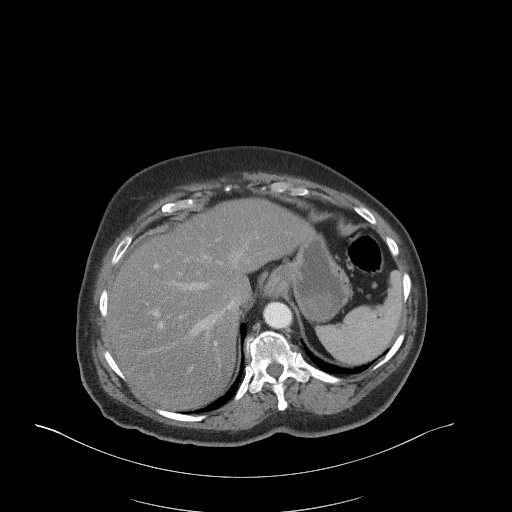
[im 81/86  lung]
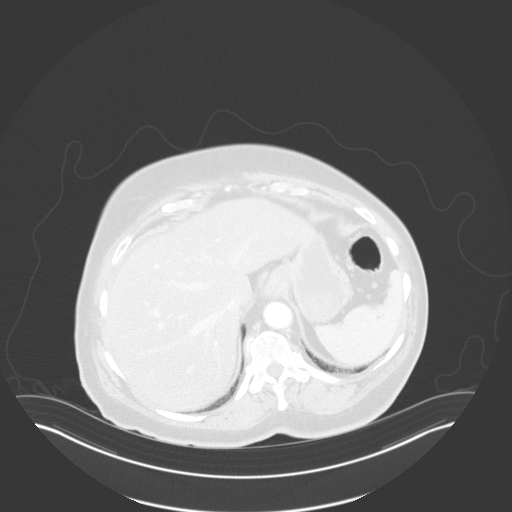
[im 81/86  bone]
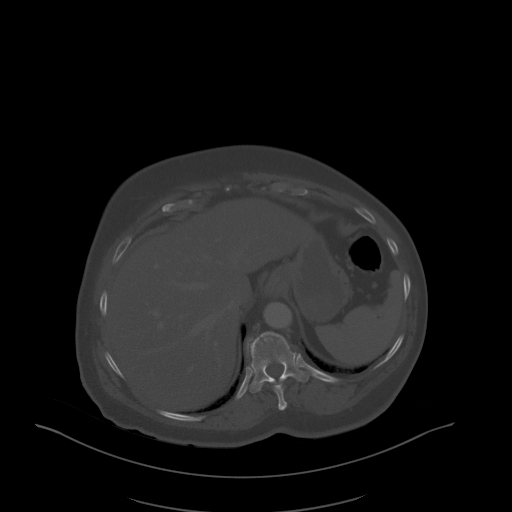

[Series 5: coronal st · coronal · 0.85mm/px · 3 of 107 slices shown]
[im 27/107  soft-tissue]
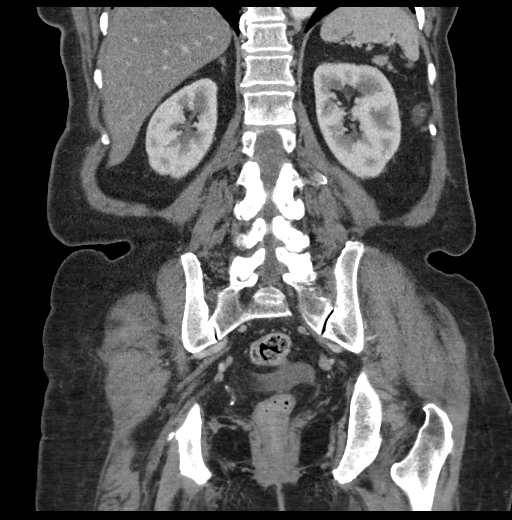
[im 54/107  soft-tissue]
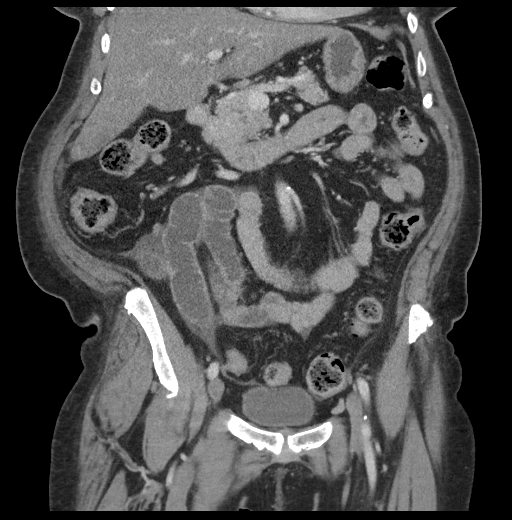
[im 80/107  soft-tissue]
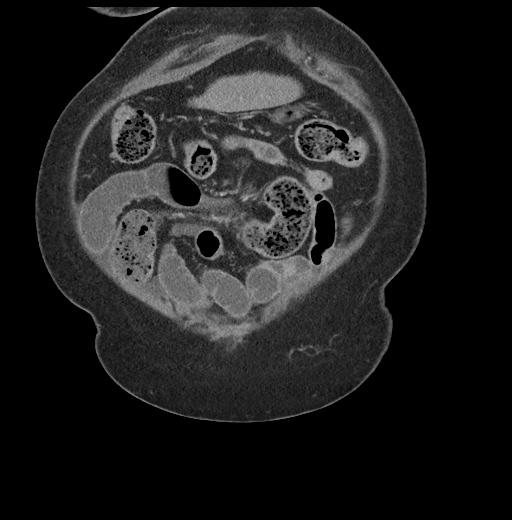

[14 of 46 positions shown; findings below may reference images not displayed]

FINDINGS: Lower chest: Bibasilar atelectasis

Hepatobiliary: Gallbladder surgically absent. Fatty infiltration of
liver. No focal hepatic mass.

Pancreas: Normal appearance

Spleen: Normal appearance. Question tiny splenules versus normal
sized lymph nodes

Adrenals/Urinary Tract: Thickening of adrenal glands without
discrete mass. Kidneys, ureters, and bladder normal appearance

Stomach/Bowel: Base of appendix is minimally thickened but remainder
of appendix is normal caliber without inflammatory changes. Dilated
small loops distally, some containing fluid but most distal loops
contain stool sign. Associated edema within some of the mesentery of
the fluid filled small bowel loops. Dilated small bowel loops with
stool extend to the ileocecal valve. Prominent stool is seen
throughout the colon. Mild wall thickening of the cecum and
ascending colon. Stomach and remaining bowel loops unremarkable.

Vascular/Lymphatic: Vascular structures patent. Atherosclerotic
calcifications at the origins of the renal arteries and at aortic
bifurcation. Aorta normal caliber. Few scattered normal size
mesenteric and retroperitoneal lymph nodes without adenopathy.

Reproductive: Uterus surgically absent. Nonvisualization of ovaries.

Other: Free fluid in pelvis, interloop, perihepatic, and minimally
at RIGHT gutter. No free air. No hernia.

Musculoskeletal: Osseous structures unremarkable.
IMPRESSION: Partial small bowel obstruction, with dilated fluid-filled loops of
small bowel as well as stool sign within distended distal small
bowel loops extending to the ileocecal valve.

Associated mesenteric edema within some of the fluid-filled small
bowel loops in the RIGHT mid abdomen as well as mild wall thickening
of the cecum.

Thickening of base of appendix though the remainder of the appendix
appears normal.

Differential diagnosis would include stricture at the ileocecal
valve with partial small bowel obstruction, inflammatory bowel
disease involving the cecum and ileocecal valve with partial
obstruction versus functional obstruction, or partial small bowel
obstruction secondary to adhesions with associated edema and
inflammation.

Findings do not appear to represent appendicitis and there is no
evidence of perforation or abscess.

## 2021-01-22 IMAGING — DX DG ABDOMEN 1V
1 series · 1 of 1 positions shown · non-contrast
Comparison: 01/22/2021

CLINICAL DATA: Enteric catheter placement

EXAM:
ABDOMEN - 1 VIEW

[abdomen kub]
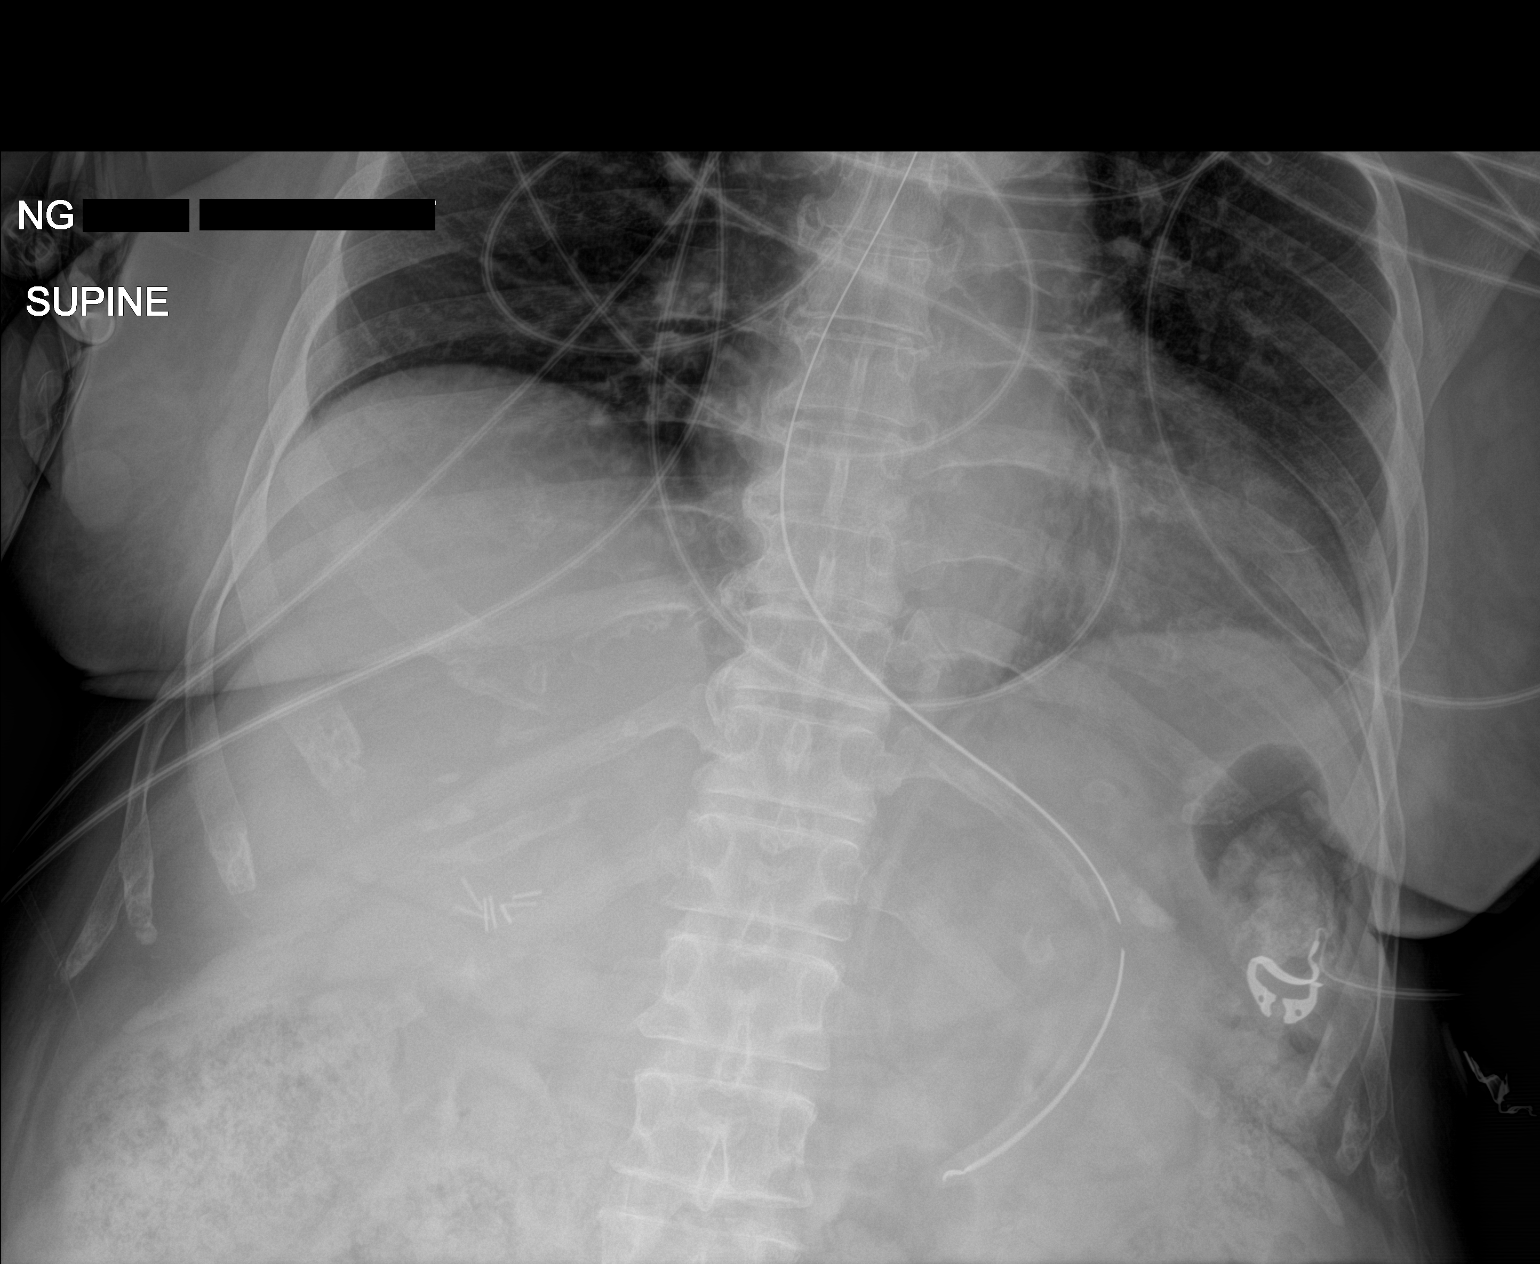

[1 of 1 positions shown; findings below may reference images not displayed]

FINDINGS: Frontal view of the lower chest and upper abdomen demonstrates
enteric catheter passing below diaphragm tip and side port
projecting over the gastric body. Lung bases are clear. Moderate
stool within the colon.
IMPRESSION: 1. Enteric catheter overlying gastric body.

## 2021-01-22 MED ORDER — METOCLOPRAMIDE HCL 5 MG/ML IJ SOLN
10.0000 mg | Freq: Three times a day (TID) | INTRAMUSCULAR | Status: AC | PRN
  Filled 2021-01-22: qty 2

## 2021-01-22 MED ORDER — ENOXAPARIN SODIUM 40 MG/0.4ML ~~LOC~~ SOLN
40.0000 mg | SUBCUTANEOUS | Status: DC
  Administered 2021-01-23 – 2021-01-30 (×8): 40 mg via SUBCUTANEOUS
  Filled 2021-01-22 (×8): qty 0.4

## 2021-01-22 MED ORDER — IOHEXOL 300 MG/ML  SOLN
100.0000 mL | Freq: Once | INTRAMUSCULAR | Status: AC | PRN
  Administered 2021-01-22: 100 mL via INTRAVENOUS

## 2021-01-22 MED ORDER — HYDROMORPHONE HCL 1 MG/ML IJ SOLN
0.5000 mg | Freq: Once | INTRAMUSCULAR | Status: AC
  Administered 2021-01-22: 0.5 mg via INTRAVENOUS
  Filled 2021-01-22: qty 1

## 2021-01-22 MED ORDER — HYDRALAZINE HCL 20 MG/ML IJ SOLN
5.0000 mg | Freq: Four times a day (QID) | INTRAMUSCULAR | Status: DC | PRN
  Administered 2021-01-22 – 2021-01-27 (×5): 5 mg via INTRAVENOUS
  Filled 2021-01-22 (×5): qty 1

## 2021-01-22 MED ORDER — PROCHLORPERAZINE EDISYLATE 10 MG/2ML IJ SOLN: 10.0000 mg | Freq: Four times a day (QID) | INTRAMUSCULAR | Status: DC | PRN

## 2021-01-22 MED ORDER — LIDOCAINE HCL URETHRAL/MUCOSAL 2 % EX GEL
1.0000 "application " | Freq: Once | CUTANEOUS | Status: AC
  Administered 2021-01-22: 1
  Filled 2021-01-22: qty 11

## 2021-01-22 MED ORDER — LORAZEPAM 2 MG/ML IJ SOLN
1.0000 mg | Freq: Once | INTRAMUSCULAR | Status: AC
  Administered 2021-01-22: 1 mg via INTRAVENOUS
  Filled 2021-01-22: qty 1

## 2021-01-22 MED ORDER — ENALAPRILAT 1.25 MG/ML IV SOLN
0.6250 mg | Freq: Four times a day (QID) | INTRAVENOUS | Status: DC
  Administered 2021-01-22 – 2021-01-23 (×3): 0.625 mg via INTRAVENOUS
  Filled 2021-01-22 (×5): qty 0.5

## 2021-01-22 MED ORDER — HYDROMORPHONE HCL 1 MG/ML IJ SOLN
1.0000 mg | Freq: Once | INTRAMUSCULAR | Status: DC
Start: 2021-01-22 — End: 2021-01-22

## 2021-01-22 MED ORDER — INSULIN ASPART 100 UNIT/ML ~~LOC~~ SOLN
0.0000 [IU] | Freq: Three times a day (TID) | SUBCUTANEOUS | Status: DC
  Administered 2021-01-23: 11 [IU] via SUBCUTANEOUS
  Administered 2021-01-23: 8 [IU] via SUBCUTANEOUS

## 2021-01-22 MED ORDER — HYDROMORPHONE HCL 1 MG/ML IJ SOLN
0.5000 mg | INTRAMUSCULAR | Status: DC | PRN
  Administered 2021-01-22 – 2021-01-26 (×7): 1 mg via INTRAVENOUS
  Filled 2021-01-22 (×8): qty 1

## 2021-01-22 MED ORDER — SODIUM CHLORIDE 0.9 % IV SOLN: INTRAVENOUS | Status: DC

## 2021-01-22 MED ORDER — HYDROMORPHONE HCL 1 MG/ML IJ SOLN
0.5000 mg | Freq: Once | INTRAMUSCULAR | Status: AC
Start: 2021-01-22 — End: 2021-01-22
  Administered 2021-01-22: 0.5 mg via INTRAVENOUS
  Filled 2021-01-22: qty 1

## 2021-01-22 MED ORDER — DIATRIZOATE MEGLUMINE & SODIUM 66-10 % PO SOLN
90.0000 mL | Freq: Once | ORAL | Status: AC
  Administered 2021-01-22: 90 mL via NASOGASTRIC
  Filled 2021-01-22: qty 90
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
## 2021-01-22 NOTE — ED Triage Notes (Signed)
N/V with associated mid to upper abd pain since last PM.  Denies any diarrhea or CP/SOB/fevers.

## 2021-01-23 ENCOUNTER — Inpatient Hospital Stay (HOSPITAL_COMMUNITY): Payer: Medicare Other

## 2021-01-23 DIAGNOSIS — N179 Acute kidney failure, unspecified: Secondary | ICD-10-CM

## 2021-01-23 DIAGNOSIS — M069 Rheumatoid arthritis, unspecified: Secondary | ICD-10-CM

## 2021-01-23 LAB — CBC
HCT: 53.7 % — ABNORMAL HIGH (ref 36.0–46.0)
Hemoglobin: 17.7 g/dL — ABNORMAL HIGH (ref 12.0–15.0)
MCH: 29.2 pg (ref 26.0–34.0)
MCHC: 33 g/dL (ref 30.0–36.0)
MCV: 88.6 fL (ref 80.0–100.0)
Platelets: 311 10*3/uL (ref 150–400)
RBC: 6.06 MIL/uL — ABNORMAL HIGH (ref 3.87–5.11)
RDW: 14.6 % (ref 11.5–15.5)
WBC: 14.9 10*3/uL — ABNORMAL HIGH (ref 4.0–10.5)
nRBC: 0 % (ref 0.0–0.2)

## 2021-01-23 LAB — BASIC METABOLIC PANEL
Anion gap: 16 — ABNORMAL HIGH (ref 5–15)
BUN: 18 mg/dL (ref 8–23)
CO2: 17 mmol/L — ABNORMAL LOW (ref 22–32)
Calcium: 9 mg/dL (ref 8.9–10.3)
Chloride: 104 mmol/L (ref 98–111)
Creatinine, Ser: 1.03 mg/dL — ABNORMAL HIGH (ref 0.44–1.00)
GFR, Estimated: 57 mL/min — ABNORMAL LOW (ref >60)
Glucose, Bld: 316 mg/dL — ABNORMAL HIGH (ref 70–99)
Potassium: 4.1 mmol/L (ref 3.5–5.1)
Sodium: 137 mmol/L (ref 135–145)

## 2021-01-23 LAB — GLUCOSE, CAPILLARY
Glucose-Capillary: 320 mg/dL — ABNORMAL HIGH (ref 70–99)
Glucose-Capillary: 251 mg/dL — ABNORMAL HIGH (ref 70–99)
Glucose-Capillary: 203 mg/dL — ABNORMAL HIGH (ref 70–99)
Glucose-Capillary: 222 mg/dL — ABNORMAL HIGH (ref 70–99)

## 2021-01-23 LAB — LACTIC ACID, PLASMA
Lactic Acid, Venous: 4.2 mmol/L (ref 0.5–1.9)
Lactic Acid, Venous: 3.5 mmol/L (ref 0.5–1.9)
Lactic Acid, Venous: 2.8 mmol/L (ref 0.5–1.9)

## 2021-01-23 IMAGING — DX DG ABD PORTABLE 1V
1 series · 3 of 3 positions shown · non-contrast
Comparison: 01/23/2021 at [DATE] a.m.

CLINICAL DATA: Abdominal pain, small-bowel obstruction

EXAM:
PORTABLE ABDOMEN - 1 VIEW

[Series 1: abdomen · 0.14mm/px · 3 of 3 slices shown]
[im 1/3]
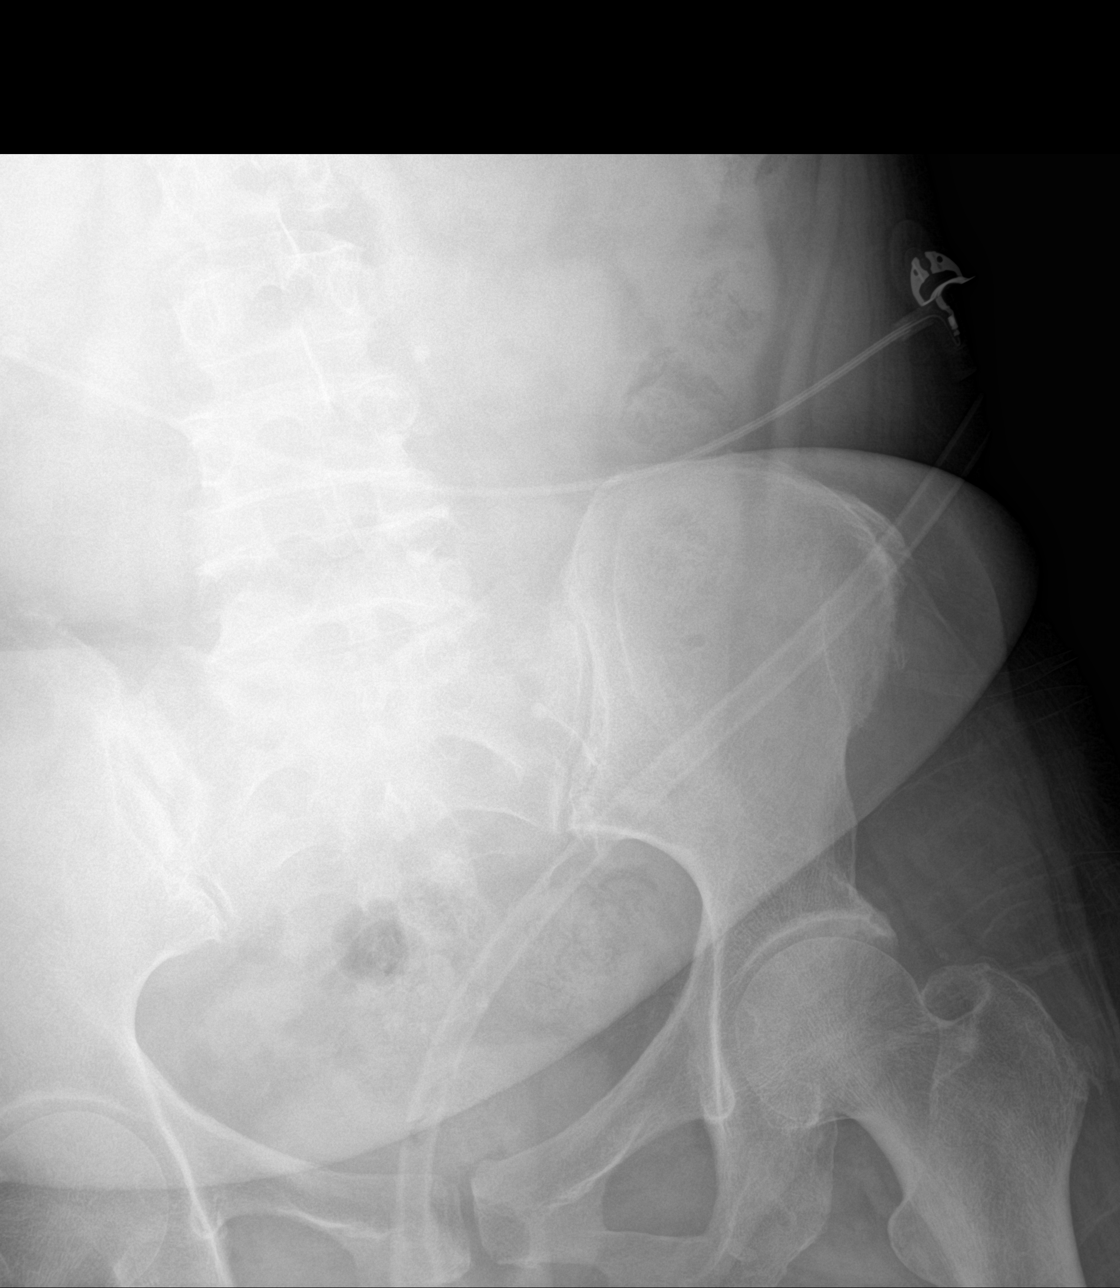
[im 2/3]
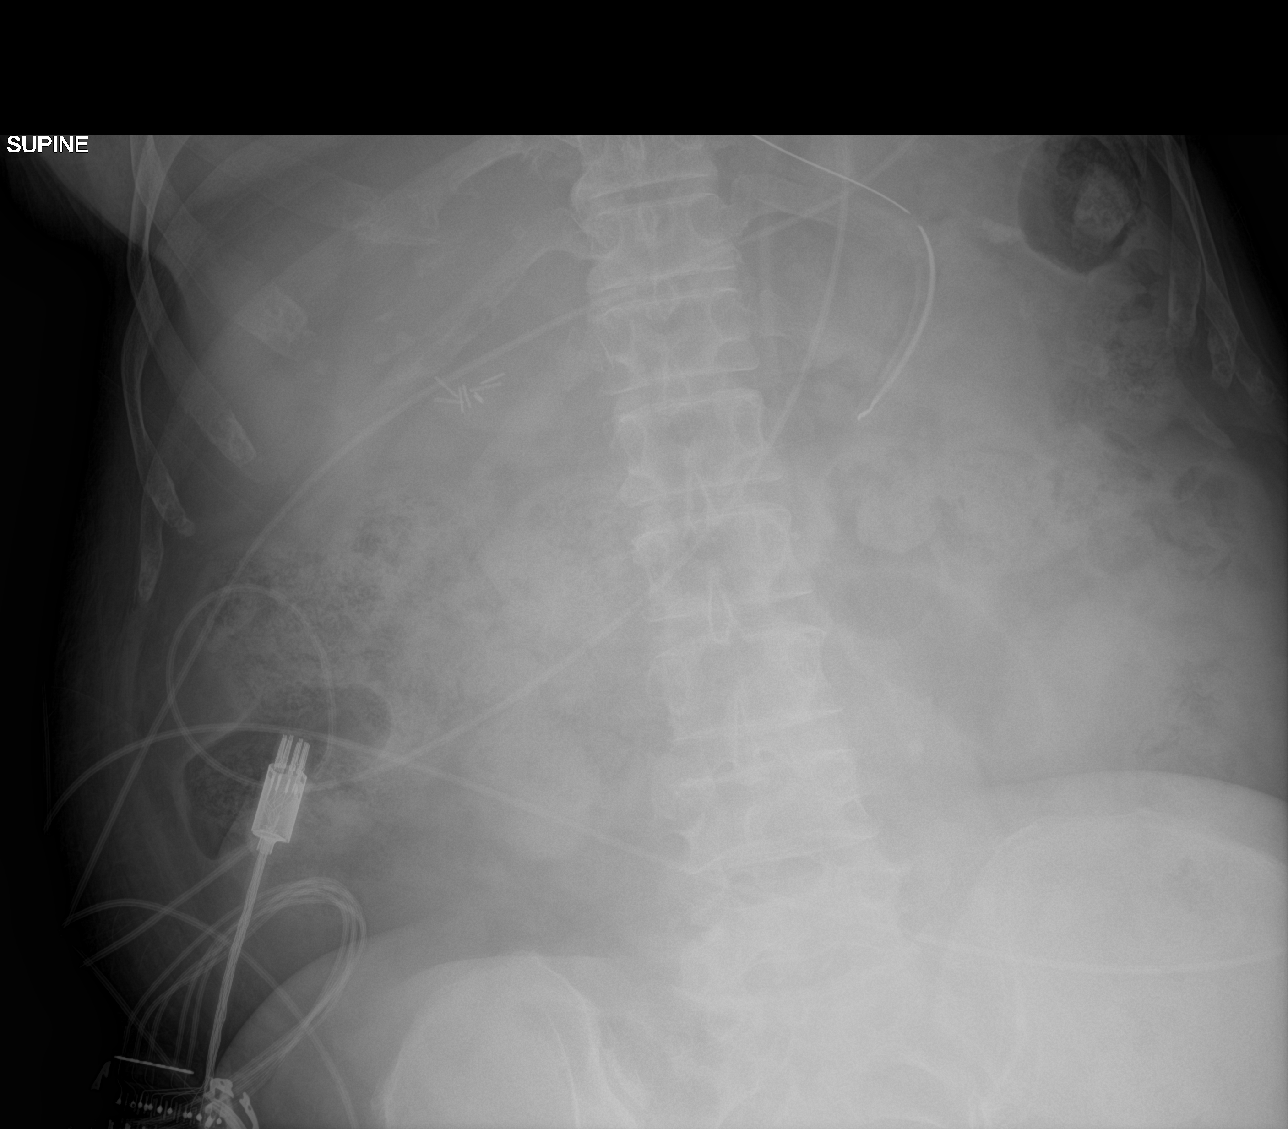
[im 3/3]
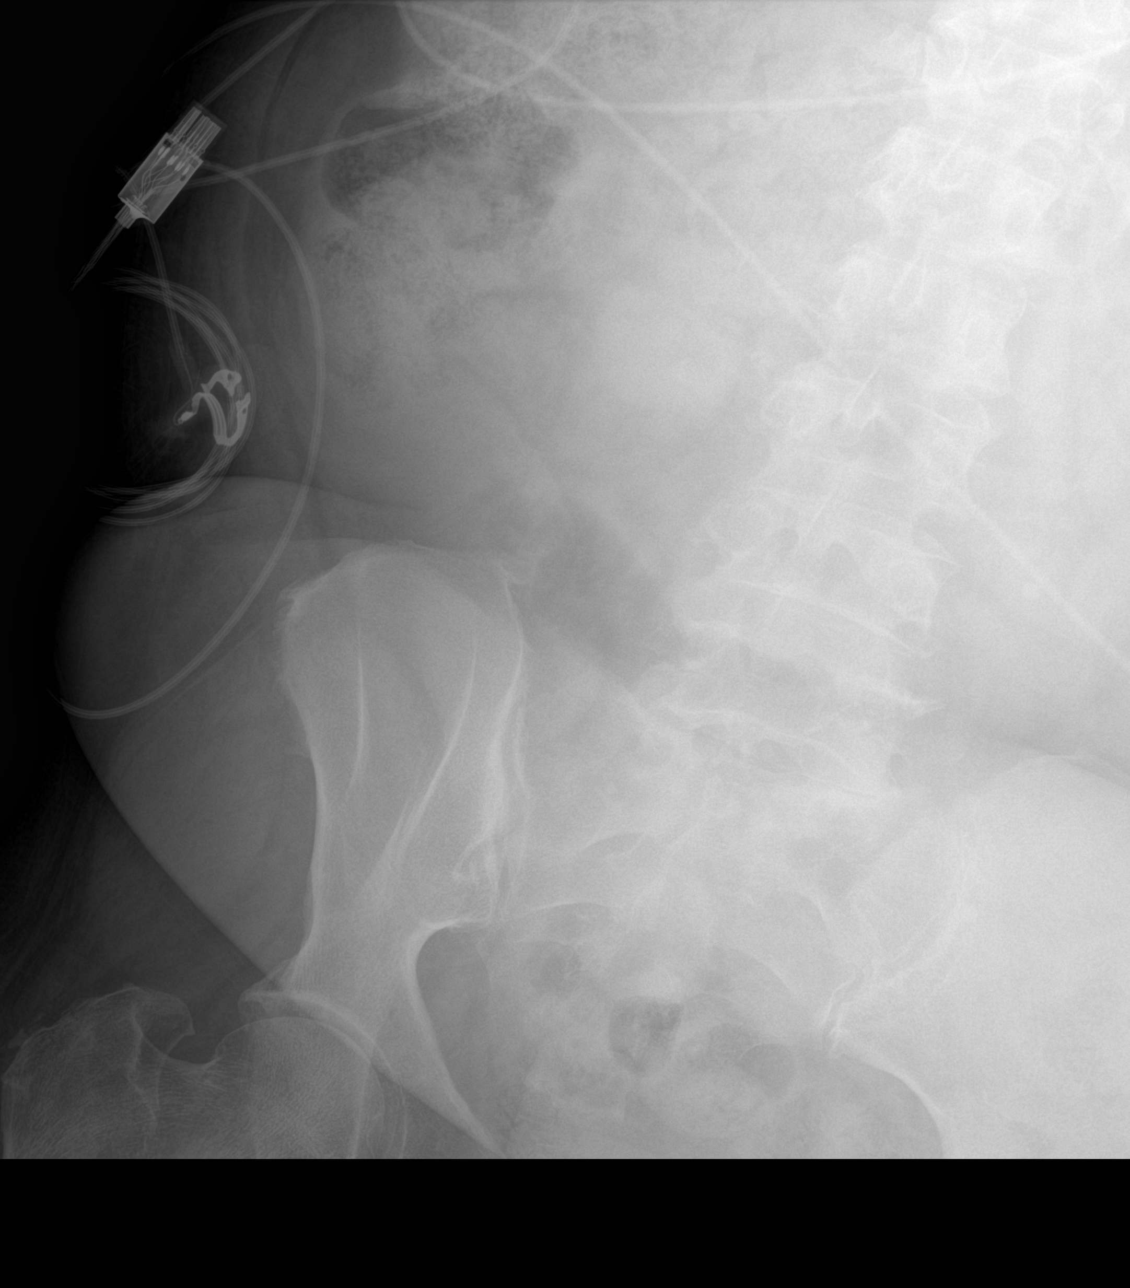

[3 of 3 positions shown; findings below may reference images not displayed]

FINDINGS: Supine frontal views of the abdomen and pelvis are obtained. Enteric
catheter tip and side port project over the gastric body. Moderate
stool throughout the colon. Distended gas-filled loops of small
bowel are seen within the central abdomen, measuring up to 3.4 cm in
diameter. The previously administered oral contrast is too dilute to
be visualized. No acute bony abnormalities.
IMPRESSION: 1. Persisting gaseous distension of small bowel consistent with
ileus or obstruction. The previously administered oral contrast is
too dilute to be identified on this exam.
2. Moderate stool throughout the colon.

## 2021-01-23 IMAGING — DX DG CHEST 1V PORT
1 series · 1 of 1 positions shown · non-contrast
Comparison: 04/02/2020

CLINICAL DATA: Respiratory failure

EXAM:
PORTABLE CHEST 1 VIEW

[chest ap]
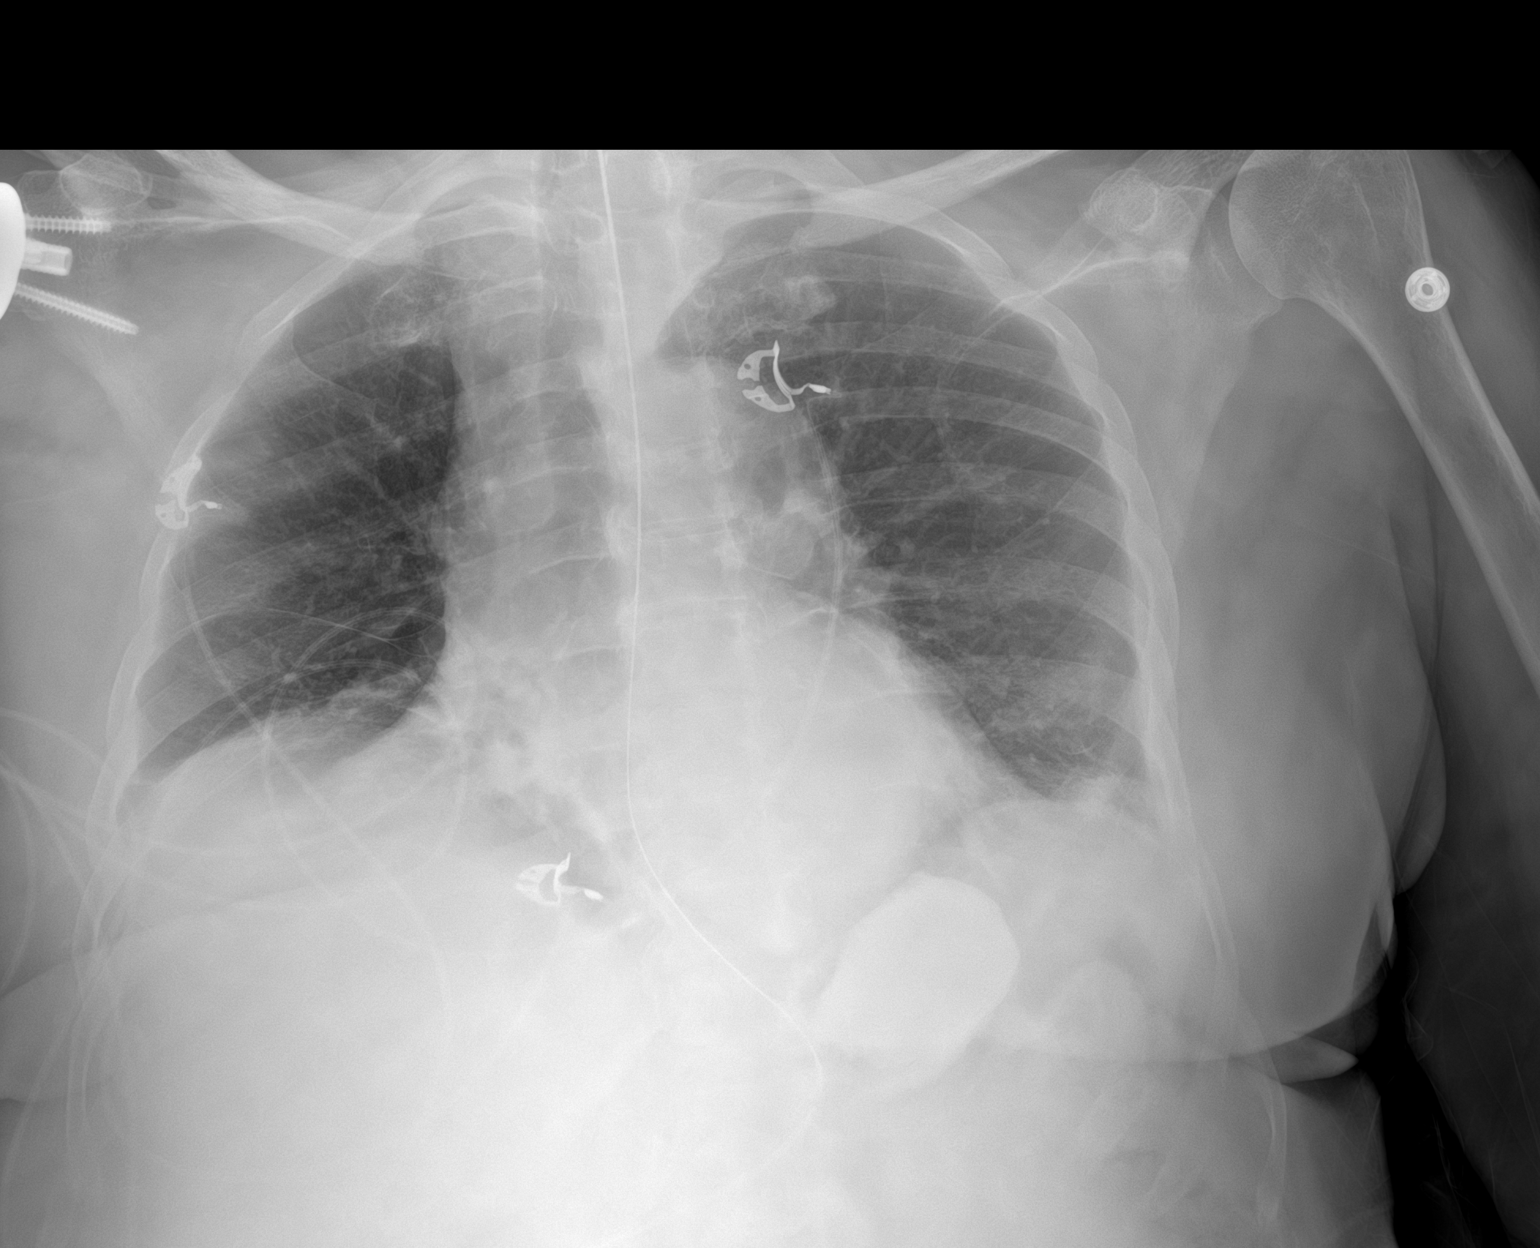

[1 of 1 positions shown; findings below may reference images not displayed]

FINDINGS: Low volume AP portable examination with perhaps minimal bibasilar
atelectasis. Mild cardiomegaly. Esophagogastric tube with tip and
side port below the diaphragm.
IMPRESSION: 1. Low volume AP portable examination with perhaps minimal bibasilar
atelectasis.
2. Mild cardiomegaly.

## 2021-01-23 IMAGING — DX DG ABD PORTABLE 1V
2 series · 2 of 2 positions shown · non-contrast
Comparison: CT abdomen and pelvis January 22, 2021; abdominal
radiograph January 22, 2021

CLINICAL DATA: Recent small-bowel obstruction

EXAM:
PORTABLE ABDOMEN - 1 VIEW

[abdomen kub (1 of 2)]
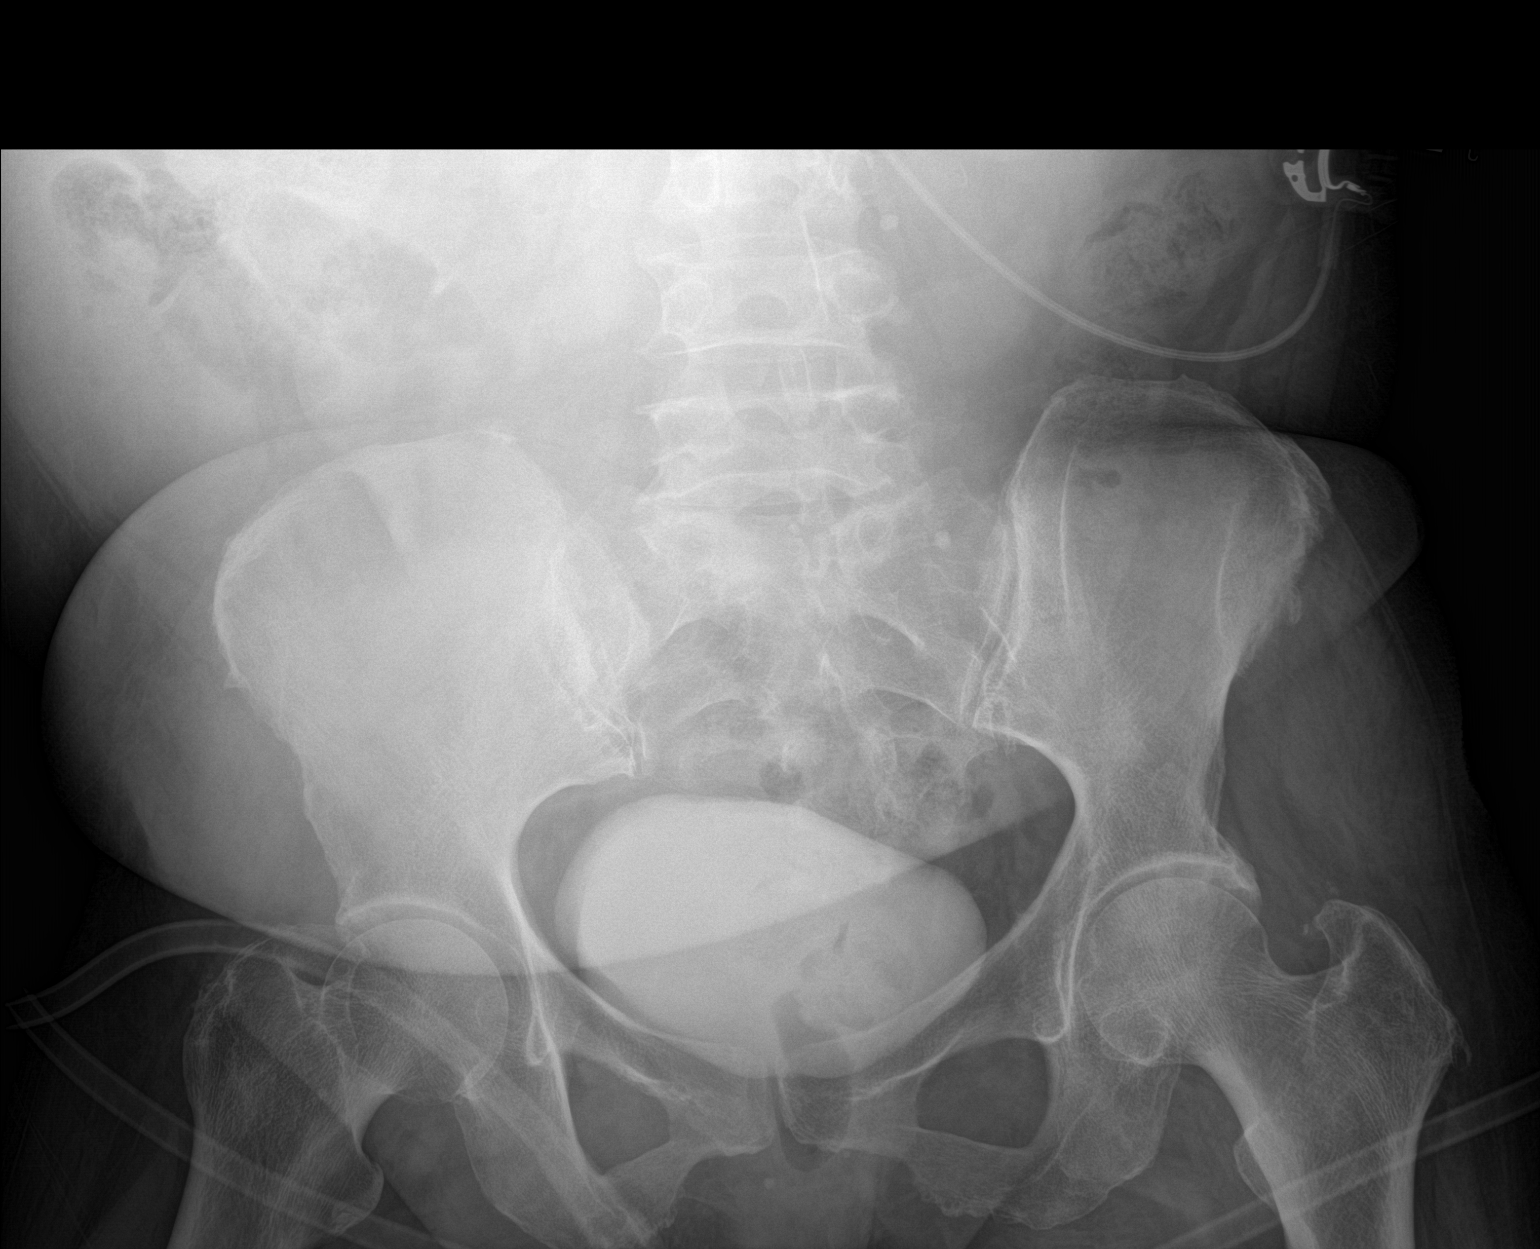

[abdomen kub (2 of 2)]
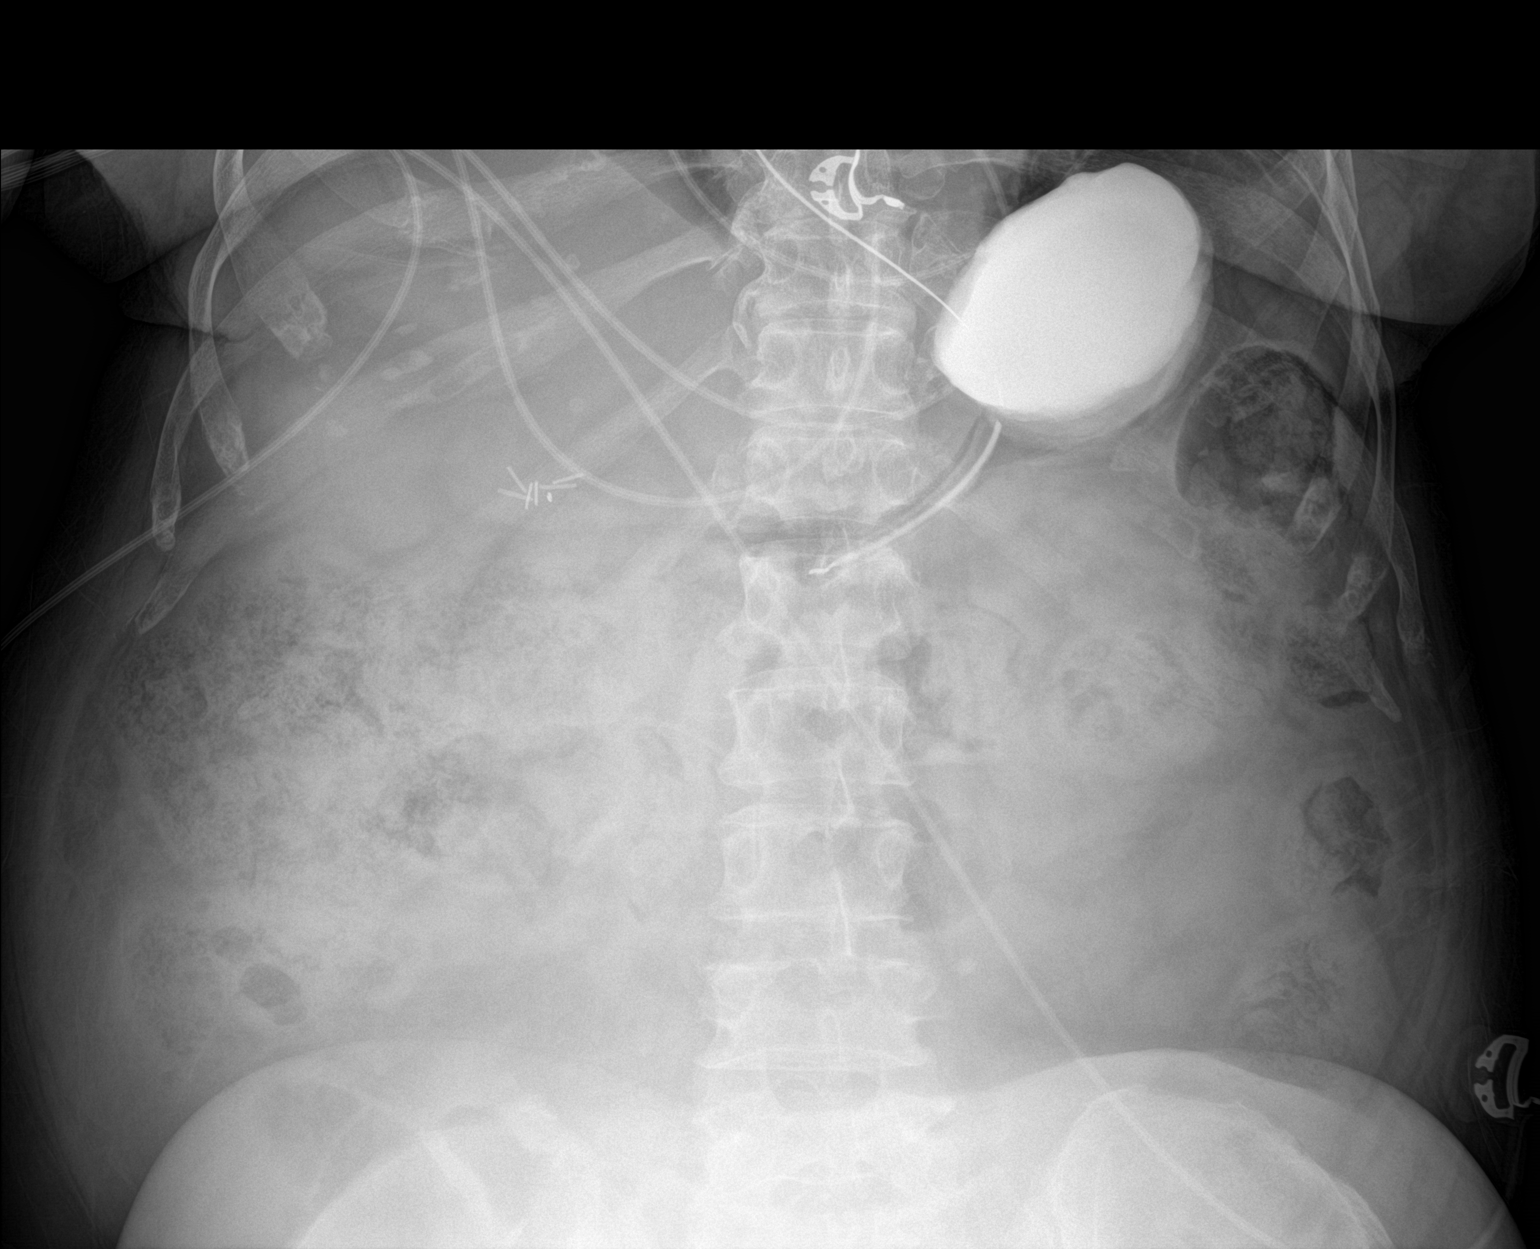

[2 of 2 positions shown; findings below may reference images not displayed]

FINDINGS: Nasogastric tube tip and side port in stomach. There is diffuse
stool throughout the colon. There is oral contrast in the stomach;
oral contrast seen beyond the stomach. There is intravenous contrast
in the urinary bladder. There is currently no appreciable bowel
dilatation or air-fluid level to suggest bowel obstruction. No free
air. Scattered calcifications noted on the left which may have
vascular etiology. Visualized lung bases clear.
IMPRESSION: Nasogastric tube tip and side port in stomach. No contrast beyond
the stomach. Question a degree of ileus.

Diffuse stool throughout colon. No bowel dilatation or air-fluid
level to suggest bowel obstruction by radiography. No free air
evident.

## 2021-01-23 MED ORDER — INSULIN ASPART 100 UNIT/ML ~~LOC~~ SOLN
0.0000 [IU] | Freq: Four times a day (QID) | SUBCUTANEOUS | Status: DC
  Administered 2021-01-23 – 2021-01-24 (×2): 5 [IU] via SUBCUTANEOUS
  Administered 2021-01-24: 3 [IU] via SUBCUTANEOUS
  Administered 2021-01-24: 5 [IU] via SUBCUTANEOUS
  Administered 2021-01-24 – 2021-01-25 (×2): 3 [IU] via SUBCUTANEOUS
  Administered 2021-01-25: 5 [IU] via SUBCUTANEOUS
  Administered 2021-01-25: 2 [IU] via SUBCUTANEOUS
  Administered 2021-01-26: 3 [IU] via SUBCUTANEOUS
  Administered 2021-01-26: 8 [IU] via SUBCUTANEOUS
  Administered 2021-01-27: 2 [IU] via SUBCUTANEOUS
  Administered 2021-01-27: 5 [IU] via SUBCUTANEOUS
  Administered 2021-01-27: 3 [IU] via SUBCUTANEOUS
  Administered 2021-01-27 – 2021-01-28 (×2): 2 [IU] via SUBCUTANEOUS
  Administered 2021-01-28: 5 [IU] via SUBCUTANEOUS
  Administered 2021-01-28: 3 [IU] via SUBCUTANEOUS
  Administered 2021-01-29: 5 [IU] via SUBCUTANEOUS
  Administered 2021-01-29: 11 [IU] via SUBCUTANEOUS
  Administered 2021-01-29 – 2021-01-30 (×2): 2 [IU] via SUBCUTANEOUS
  Administered 2021-01-30: 5 [IU] via SUBCUTANEOUS

## 2021-01-23 MED ORDER — PIPERACILLIN-TAZOBACTAM 3.375 G IVPB
3.3750 g | Freq: Three times a day (TID) | INTRAVENOUS | Status: AC
  Administered 2021-01-23 – 2021-01-27 (×14): 3.375 g via INTRAVENOUS
  Filled 2021-01-23 (×14): qty 50

## 2021-01-23 MED ORDER — METHYLPREDNISOLONE SODIUM SUCC 40 MG IJ SOLR
40.0000 mg | Freq: Every day | INTRAMUSCULAR | Status: DC
  Administered 2021-01-23 – 2021-01-29 (×7): 40 mg via INTRAVENOUS
  Filled 2021-01-23 (×7): qty 1

## 2021-01-23 MED ORDER — WHITE PETROLATUM EX OINT
TOPICAL_OINTMENT | CUTANEOUS | Status: AC
  Administered 2021-01-23: 0.2
  Filled 2021-01-23: qty 28.35

## 2021-01-23 MED ORDER — INSULIN GLARGINE 100 UNIT/ML ~~LOC~~ SOLN
15.0000 [IU] | Freq: Every day | SUBCUTANEOUS | Status: DC
  Administered 2021-01-23 – 2021-01-29 (×7): 15 [IU] via SUBCUTANEOUS
  Filled 2021-01-23 (×8): qty 0.15

## 2021-01-23 MED ORDER — SODIUM CHLORIDE 0.9 % IV BOLUS
1000.0000 mL | Freq: Once | INTRAVENOUS | Status: AC
  Administered 2021-01-23: 1000 mL via INTRAVENOUS

## 2021-01-23 NOTE — Progress Notes (Signed)
Subjective: CC: Patient reports upper abdominal pain, bloating and nausea. NGT tubing had become dislodged from cannister and she was vomiting when I entered the room.  After I fixed this, she had greater than 150 cc immediately into NG tube canister.  She reports her nausea did improve some after this.  She denies any flatus or BM.   Follow-up questioning from Dr. Dois Davenport note, she is unsure who did her colonoscopy or what that showed  Objective: Vital signs in last 24 hours: Temp:  [98.1 F (36.7 C)-99.1 F (37.3 C)] 99.1 F (37.3 C) (03/25 0541) Pulse Rate:  [78-114] 114 (03/25 0806) Resp:  [12-18] 16 (03/25 0541) BP: (118-192)/(76-112) 118/76 (03/25 0806) SpO2:  [89 %-98 %] 96 % (03/25 0541) Weight:  [98.4 kg] 98.4 kg (03/24 0847) Last BM Date: 01/21/21 (PTA per patient)  Intake/Output from previous day: 03/24 0701 - 03/25 0700 In: 2386.7 [I.V.:1420.1; IV Piggyback:966.7] Out: 625 [Urine:200; Emesis/NG output:425] Intake/Output this shift: No intake/output data recorded.  PE: Gen:  Alert, NAD, pleasant Pulm: rate and effort normal Abd: Soft, moderate distension, generalized abdominal tenderness greater in the upper abdomen without peritonitis, hypoactive bowel sounds, NGT in place with bilious output in cannister (~750cc) Psych: A&Ox3  Skin: no rashes noted, warm and dry   Lab Results:  Recent Labs    01/22/21 0923 01/23/21 0059  WBC 8.6 14.9*  HGB 14.3 17.7*  HCT 44.1 53.7*  PLT 232 311   BMET Recent Labs    01/22/21 0923 01/23/21 0059  NA 137 137  K 3.9 4.1  CL 100 104  CO2 27 17*  GLUCOSE 225* 316*  BUN 8 18  CREATININE 0.60 1.03*  CALCIUM 9.1 9.0   PT/INR No results for input(s): LABPROT, INR in the last 72 hours. CMP     Component Value Date/Time   NA 137 01/23/2021 0059   NA 145 06/19/2018 0000   K 4.1 01/23/2021 0059   CL 104 01/23/2021 0059   CO2 17 (L) 01/23/2021 0059   GLUCOSE 316 (H) 01/23/2021 0059   BUN 18 01/23/2021 0059    BUN 7 06/19/2018 0000   CREATININE 1.03 (H) 01/23/2021 0059   CALCIUM 9.0 01/23/2021 0059   PROT 7.8 01/22/2021 0923   ALBUMIN 3.9 01/22/2021 0923   AST 22 01/22/2021 0923   ALT 16 01/22/2021 0923   ALKPHOS 55 01/22/2021 0923   BILITOT 0.4 01/22/2021 0923   GFRNONAA 57 (L) 01/23/2021 0059   GFRAA >60 12/09/2019 2243   Lipase     Component Value Date/Time   LIPASE 42 01/22/2021 0923       Studies/Results: DG Abdomen 1 View  Result Date: 01/22/2021 CLINICAL DATA:  Enteric catheter placement EXAM: ABDOMEN - 1 VIEW COMPARISON:  01/22/2021 FINDINGS: Frontal view of the lower chest and upper abdomen demonstrates enteric catheter passing below diaphragm tip and side port projecting over the gastric body. Lung bases are clear. Moderate stool within the colon. IMPRESSION: 1. Enteric catheter overlying gastric body. Electronically Signed   By: Randa Ngo M.D.   On: 01/22/2021 15:25   CT Abdomen Pelvis W Contrast  Result Date: 01/22/2021 CLINICAL DATA:  Nausea, vomiting, mid upper abdominal pain since last night, history hypertension, diabetes mellitus, GERD, hysterectomy, cholecystectomy, umbilical hernia repair EXAM: CT ABDOMEN AND PELVIS WITH CONTRAST TECHNIQUE: Multidetector CT imaging of the abdomen and pelvis was performed using the standard protocol following bolus administration of intravenous contrast. Sagittal and coronal MPR images reconstructed from  axial data set. CONTRAST:  168mL OMNIPAQUE IOHEXOL 300 MG/ML SOLN IV. No oral contrast. COMPARISON:  04/02/2020 FINDINGS: Lower chest: Bibasilar atelectasis Hepatobiliary: Gallbladder surgically absent. Fatty infiltration of liver. No focal hepatic mass. Pancreas: Normal appearance Spleen: Normal appearance. Question tiny splenules versus normal sized lymph nodes Adrenals/Urinary Tract: Thickening of adrenal glands without discrete mass. Kidneys, ureters, and bladder normal appearance Stomach/Bowel: Base of appendix is minimally  thickened but remainder of appendix is normal caliber without inflammatory changes. Dilated small loops distally, some containing fluid but most distal loops contain stool sign. Associated edema within some of the mesentery of the fluid filled small bowel loops. Dilated small bowel loops with stool extend to the ileocecal valve. Prominent stool is seen throughout the colon. Mild wall thickening of the cecum and ascending colon. Stomach and remaining bowel loops unremarkable. Vascular/Lymphatic: Vascular structures patent. Atherosclerotic calcifications at the origins of the renal arteries and at aortic bifurcation. Aorta normal caliber. Few scattered normal size mesenteric and retroperitoneal lymph nodes without adenopathy. Reproductive: Uterus surgically absent. Nonvisualization of ovaries. Other: Free fluid in pelvis, interloop, perihepatic, and minimally at RIGHT gutter. No free air. No hernia. Musculoskeletal: Osseous structures unremarkable. IMPRESSION: Partial small bowel obstruction, with dilated fluid-filled loops of small bowel as well as stool sign within distended distal small bowel loops extending to the ileocecal valve. Associated mesenteric edema within some of the fluid-filled small bowel loops in the RIGHT mid abdomen as well as mild wall thickening of the cecum. Thickening of base of appendix though the remainder of the appendix appears normal. Differential diagnosis would include stricture at the ileocecal valve with partial small bowel obstruction, inflammatory bowel disease involving the cecum and ileocecal valve with partial obstruction versus functional obstruction, or partial small bowel obstruction secondary to adhesions with associated edema and inflammation. Findings do not appear to represent appendicitis and there is no evidence of perforation or abscess. Electronically Signed   By: Lavonia Dana M.D.   On: 01/22/2021 11:23   DG Abd Portable 1V-Small Bowel Obstruction Protocol-initial, 8  hr delay  Result Date: 01/23/2021 CLINICAL DATA:  Recent small-bowel obstruction EXAM: PORTABLE ABDOMEN - 1 VIEW COMPARISON:  CT abdomen and pelvis January 22, 2021; abdominal radiograph January 22, 2021 FINDINGS: Nasogastric tube tip and side port in stomach. There is diffuse stool throughout the colon. There is oral contrast in the stomach; oral contrast seen beyond the stomach. There is intravenous contrast in the urinary bladder. There is currently no appreciable bowel dilatation or air-fluid level to suggest bowel obstruction. No free air. Scattered calcifications noted on the left which may have vascular etiology. Visualized lung bases clear. IMPRESSION: Nasogastric tube tip and side port in stomach. No contrast beyond the stomach. Question a degree of ileus. Diffuse stool throughout colon. No bowel dilatation or air-fluid level to suggest bowel obstruction by radiography. No free air evident. Electronically Signed   By: Lowella Grip III M.D.   On: 01/23/2021 08:24    Anti-infectives: Anti-infectives (From admission, onward)   None     CT 3/24 Partial small bowel obstruction, with dilated fluid-filled loops of small bowel as well as stool sign within distended distal small bowel loops extending to the ileocecal valve.  Associated mesenteric edema within some of the fluid-filled small bowel loops in the RIGHT mid abdomen as well as mild wall thickening of the cecum.  Thickening of base of appendix though the remainder of the appendix appears normal.  Differential diagnosis would include stricture at the ileocecal valve  with partial small bowel obstruction, inflammatory bowel disease involving the cecum and ileocecal valve with partial obstruction versus functional obstruction, or partial small bowel obstruction secondary to adhesions with associated edema and inflammation.  Findings do not appear to represent appendicitis and there is no evidence of perforation or  abscess.  Assessment/Plan Diabetes mellitus type 2 Obesity Hypertension Rheumatoid arthritis on Plaquenil and methotrexate  SBO - Hx of hysterectomy, lap chole and umbilical hernia repair - CT scan findings as noted above. She believes her last colonoscopy was last year but she is unsure who performed this and what it showed - Although she does have thickening at the base of the appendix on CT, radiology felt the remainder of the appendix appeared normal and did not represent appendicitis. She does not have focal tenderness of the RLQ  - Continue sbo protocol. If she does not improve in 24-48 hours, may need to consider OR - Mobilize  FEN - NPO, NGT VTE - SCDs, okay for chemical prophylaxis from our standpoint ID - None   LOS: 1 day    Jillyn Ledger , [facility] 01/23/2021, 8:35 AM Please see Amion for pager number during day hours 7:00am-4:30pm

## 2021-01-23 NOTE — Progress Notes (Signed)
   01/23/21 1516  Assess: MEWS Score  Temp 98.2 F (36.8 C)  BP (!) 166/97  Pulse Rate (!) 115  Resp 17  SpO2 98 %  Assess: MEWS Score  MEWS Temp 0  MEWS Systolic 0  MEWS Pulse 2  MEWS RR 0  MEWS LOC 0  MEWS Score 2  MEWS Score Color Yellow  Assess: if the MEWS score is Yellow or Red  Were vital signs taken at a resting state? Yes  Focused Assessment No change from prior assessment  Early Detection of Sepsis Score *See Row Information* Low  MEWS guidelines implemented *See Row Information* Yes  Take Vital Signs  Increase Vital Sign Frequency  Yellow: Q 2hr X 2 then Q 4hr X 2, if remains yellow, continue Q 4hrs  Escalate  MEWS: Escalate Yellow: discuss with charge nurse/RN and consider discussing with provider and RRT  Notify: Charge Nurse/RN  Name of Charge Nurse/RN Notified Gaspar Bidding, RN  Date Charge Nurse/RN Notified 01/23/21  Time Charge Nurse/RN Notified 1534  Document  Patient Outcome Other (Comment)  Progress note created (see row info) Yes    Patient remains yellow MEWS after PRN hydralazine administered. Yellow MEWS protocol started. NT and charge nurse are aware.
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
## 2021-01-23 NOTE — Progress Notes (Signed)
PROGRESS NOTE    Diane [name]  ZDG:[id number] DOB: May 18, 1946 DOA: 01/22/2021 PCP: [name], MD     Brief Narrative:  Diane [name] is a 75 y.o. female with medical history significant of HTN, IDDM, rheumatoid arthritis, anxiety, depression, mild cognitive deficit who presented with new onset of abdominal pain, nausea, vomiting. Her symptoms started last night after dinner.  Abdominal pain vaguely localized on the right aspect near umbilicus, pain has been cramping intermittent but becomes more constant this morning. Abdominal x-ray showed small bowel perforation, CT abdomen showed dilated bowels, suspicious for ileus cecal wall stricture.  General surgery was consulted.  New events last 24 hours / Subjective: Patient remains a poor historian.  She is able to tell me that she normally lives at home alone, has children who lives nearby in [place].  Continues to have generalized abdominal pain worse on the right side.  Admits to nausea.  Asking for water  Assessment & Plan:   Principal Problem:   SBO (small bowel obstruction) (HCC) Active Problems:   Prolonged Q-T interval on ECG   Type 2 diabetes mellitus with diabetic neuropathy, unspecified (HCC)   Peripheral neuropathy   AKI (acute kidney injury) ([place])   Rheumatoid arthritis (HCC)   pSBO -CT A/P: Partial small bowel obstruction, with dilated fluid-filled loops of small bowel as well as stool sign within distended distal small bowel loops extending to the ileocecal valve. Associated mesenteric edema within some of the fluid-filled small bowel loops in the RIGHT mid abdomen as well as mild wall thickening of the cecum. Thickening of base of appendix though the remainder of the appendix appears normal. Findings do not appear to represent appendicitis and there is no evidence of perforation or abscess. -Continue NGT, n.p.o., IV fluid -General surgery following  Severe sepsis, not present on admission, question abdominal  source -Patient was afebrile, HR 81, RR 18, WBC 8.6 at the time of admission.  This morning, patient remains tachycardic with leukocytosis 14.9 and lactic acidosis 4.2 with AKI -Covid, influenza negative -Check blood culture, UA and urine culture, chest x-ray -Start empiric IV Zosyn to cover intra-abdominal source -Repeat lactic acid -IV fluid  Acute hypoxemic respiratory failure -Patient with SPO2 89% on room air, currently on 2 L nasal cannula O2, wean as able -Check chest x-ray   AKI -Presented with creatinine 0.6, up to 1.03 today -Continue to trend BMP, continue IV fluid  Diabetes mellitus, insulin-dependent, poorly controlled with hyperglycemia -Hemoglobin A1c 7.3 -Hold home Metformin, Neurontin -Added Lantus, continue sliding scale insulin  Hypertension -Hold home medication including Norvasc, telmisartan while n.p.o.  Rheumatoid arthritis -Hold home Plaquenil, Norco, methotrexate  Anxiety/depression -Hold home Cymbalta  Mild cognitive deficit -Per daughter, patient has had some memory loss over the years, possibly early beginning of dementia.  Patient does live at home alone and is independent of ADLs at baseline  Prolonged QTc -Repeat EKG reviewed independently reveals QTC 500   DVT prophylaxis:  enoxaparin (LOVENOX) injection 40 mg Start: 01/23/21 1000  Code Status: Full code Family Communication: Updated daughter Larena Glassman over the phone.  She was unable to add much to the history of present illness or her previous work-up from GI Disposition Plan:  Status is: Inpatient  Remains inpatient appropriate because:IV treatments appropriate due to intensity of illness or inability to take PO and Inpatient level of care appropriate due to severity of illness   Dispo: The patient is from: Home  Anticipated d/c is to: Home              Patient currently is not medically stable to d/c.  Continue treatment for SBO   Difficult to place patient  No      Consultants:   General surgery   Antimicrobials:  Anti-infectives (From admission, onward)   None        Objective: Vitals:   01/22/21 2345 01/23/21 0541 01/23/21 0806 01/23/21 0838  BP: (!) 151/98 138/80 118/76 133/72  Pulse:  (!) 110 (!) 114 (!) 110  Resp:  16  17  Temp:  99.1 F (37.3 C)  98.1 F (36.7 C)  TempSrc:  Oral  Oral  SpO2:  96%  96%  Weight:      Height:        Intake/Output Summary (Last 24 hours) at 01/23/2021 1236 Last data filed at 01/23/2021 0900 Gross per 24 hour  Intake 2386.73 ml  Output 850 ml  Net 1536.73 ml   Filed Weights   01/22/21 0847  Weight: 98.4 kg    Examination:  General exam: Appears calm and comfortable  Respiratory system: Clear to auscultation anteriorly. Respiratory effort normal. No respiratory distress. Cardiovascular system: S1 & S2 heard, tachycardic, regular. No murmurs. No pedal edema. Gastrointestinal system: Abdomen is mildly distended, tender to palpation diffusely, worse on the right hemiabdomen Central nervous system: Alert, nonfocal exam Extremities: Symmetric in appearance  Skin: No rashes, lesions or ulcers on exposed skin  Psychiatry: Remains a very poor historian   Data Reviewed: I have personally reviewed following labs and imaging studies  CBC: Recent Labs  Lab 01/22/21 0923 01/23/21 0059  WBC 8.6 14.9*  NEUTROABS 7.2  --   HGB 14.3 17.7*  HCT 44.1 53.7*  MCV 90.6 88.6  PLT 232 979   Basic Metabolic Panel: Recent Labs  Lab 01/22/21 0923 01/23/21 0059  NA 137 137  K 3.9 4.1  CL 100 104  CO2 27 17*  GLUCOSE 225* 316*  BUN 8 18  CREATININE 0.60 1.03*  CALCIUM 9.1 9.0   GFR: Estimated Creatinine Clearance: 58.8 mL/min (A) (by C-G formula based on SCr of 1.03 mg/dL (H)). Liver Function Tests: Recent Labs  Lab 01/22/21 0923  AST 22  ALT 16  ALKPHOS 55  BILITOT 0.4  PROT 7.8  ALBUMIN 3.9   Recent Labs  Lab 01/22/21 0923  LIPASE 42   No results for input(s):  AMMONIA in the last 168 hours. Coagulation Profile: No results for input(s): INR, PROTIME in the last 168 hours. Cardiac Enzymes: No results for input(s): CKTOTAL, CKMB, CKMBINDEX, TROPONINI in the last 168 hours. BNP (last 3 results) No results for input(s): PROBNP in the last 8760 hours. HbA1C: Recent Labs    01/22/21 1753  HGBA1C 7.3*   CBG: Recent Labs  Lab 01/22/21 2101 01/23/21 0718 01/23/21 1151  GLUCAP 251* 320* 251*   Lipid Profile: No results for input(s): CHOL, HDL, LDLCALC, TRIG, CHOLHDL, LDLDIRECT in the last 72 hours. Thyroid Function Tests: No results for input(s): TSH, T4TOTAL, FREET4, T3FREE, THYROIDAB in the last 72 hours. Anemia Panel: No results for input(s): VITAMINB12, FOLATE, FERRITIN, TIBC, IRON, RETICCTPCT in the last 72 hours. Sepsis Labs: Recent Labs  Lab 01/23/21 0059  LATICACIDVEN 4.2*    Recent Results (from the past 240 hour(s))  Resp Panel by RT-PCR (Flu A&B, Covid) Nasopharyngeal Swab     Status: None   Collection Time: 01/22/21  3:40 PM   Specimen: Nasopharyngeal Swab; Nasopharyngeal(NP)  swabs in vial transport medium  Result Value Ref Range Status   SARS Coronavirus 2 by RT PCR NEGATIVE NEGATIVE Final    Comment: (NOTE) SARS-CoV-2 target nucleic acids are NOT DETECTED.  The SARS-CoV-2 RNA is generally detectable in upper respiratory specimens during the acute phase of infection. The lowest concentration of SARS-CoV-2 viral copies this assay can detect is 138 copies/mL. A negative result does not preclude SARS-Cov-2 infection and should not be used as the sole basis for treatment or other patient management decisions. A negative result may occur with  improper specimen collection/handling, submission of specimen other than nasopharyngeal swab, presence of viral mutation(s) within the areas targeted by this assay, and inadequate number of viral copies(<138 copies/mL). A negative result must be combined with clinical observations,  patient history, and epidemiological information. The expected result is Negative.  Fact Sheet for Patients:  EntrepreneurPulse.com.au  Fact Sheet for Healthcare Providers:  IncredibleEmployment.be  This test is no t yet approved or cleared by the [place] FDA and  has been authorized for detection and/or diagnosis of SARS-CoV-2 by FDA under an Emergency Use Authorization (EUA). This EUA will remain  in effect (meaning this test can be used) for the duration of the COVID-19 declaration under Section 564(b)(1) of the Act, 21 U.S.C.section 360bbb-3(b)(1), unless the authorization is terminated  or revoked sooner.       Influenza A by PCR NEGATIVE NEGATIVE Final   Influenza B by PCR NEGATIVE NEGATIVE Final    Comment: (NOTE) The Xpert Xpress SARS-CoV-2/FLU/RSV plus assay is intended as an aid in the diagnosis of influenza from Nasopharyngeal swab specimens and should not be used as a sole basis for treatment. Nasal washings and aspirates are unacceptable for Xpert Xpress SARS-CoV-2/FLU/RSV testing.  Fact Sheet for Patients: EntrepreneurPulse.com.au  Fact Sheet for Healthcare Providers: IncredibleEmployment.be  This test is not yet approved or cleared by the [place] FDA and has been authorized for detection and/or diagnosis of SARS-CoV-2 by FDA under an Emergency Use Authorization (EUA). This EUA will remain in effect (meaning this test can be used) for the duration of the COVID-19 declaration under Section 564(b)(1) of the Act, 21 U.S.C. section 360bbb-3(b)(1), unless the authorization is terminated or revoked.  Performed at [facility], [address] [place], [place] [address]       Radiology Studies: DG Abdomen 1 View  Result Date: 01/22/2021 CLINICAL DATA:  Enteric catheter placement EXAM: ABDOMEN - 1 VIEW COMPARISON:  01/22/2021 FINDINGS: Frontal view of the lower chest  and upper abdomen demonstrates enteric catheter passing below diaphragm tip and side port projecting over the gastric body. Lung bases are clear. Moderate stool within the colon. IMPRESSION: 1. Enteric catheter overlying gastric body. Electronically Signed   By: Randa Ngo M.D.   On: 01/22/2021 15:25   CT Abdomen Pelvis W Contrast  Result Date: 01/22/2021 CLINICAL DATA:  Nausea, vomiting, mid upper abdominal pain since last night, history hypertension, diabetes mellitus, GERD, hysterectomy, cholecystectomy, umbilical hernia repair EXAM: CT ABDOMEN AND PELVIS WITH CONTRAST TECHNIQUE: Multidetector CT imaging of the abdomen and pelvis was performed using the standard protocol following bolus administration of intravenous contrast. Sagittal and coronal MPR images reconstructed from axial data set. CONTRAST:  138mL OMNIPAQUE IOHEXOL 300 MG/ML SOLN IV. No oral contrast. COMPARISON:  04/02/2020 FINDINGS: Lower chest: Bibasilar atelectasis Hepatobiliary: Gallbladder surgically absent. Fatty infiltration of liver. No focal hepatic mass. Pancreas: Normal appearance Spleen: Normal appearance. Question tiny splenules versus normal sized lymph nodes Adrenals/Urinary  Tract: Thickening of adrenal glands without discrete mass. Kidneys, ureters, and bladder normal appearance Stomach/Bowel: Base of appendix is minimally thickened but remainder of appendix is normal caliber without inflammatory changes. Dilated small loops distally, some containing fluid but most distal loops contain stool sign. Associated edema within some of the mesentery of the fluid filled small bowel loops. Dilated small bowel loops with stool extend to the ileocecal valve. Prominent stool is seen throughout the colon. Mild wall thickening of the cecum and ascending colon. Stomach and remaining bowel loops unremarkable. Vascular/Lymphatic: Vascular structures patent. Atherosclerotic calcifications at the origins of the renal arteries and at aortic  bifurcation. Aorta normal caliber. Few scattered normal size mesenteric and retroperitoneal lymph nodes without adenopathy. Reproductive: Uterus surgically absent. Nonvisualization of ovaries. Other: Free fluid in pelvis, interloop, perihepatic, and minimally at RIGHT gutter. No free air. No hernia. Musculoskeletal: Osseous structures unremarkable. IMPRESSION: Partial small bowel obstruction, with dilated fluid-filled loops of small bowel as well as stool sign within distended distal small bowel loops extending to the ileocecal valve. Associated mesenteric edema within some of the fluid-filled small bowel loops in the RIGHT mid abdomen as well as mild wall thickening of the cecum. Thickening of base of appendix though the remainder of the appendix appears normal. Differential diagnosis would include stricture at the ileocecal valve with partial small bowel obstruction, inflammatory bowel disease involving the cecum and ileocecal valve with partial obstruction versus functional obstruction, or partial small bowel obstruction secondary to adhesions with associated edema and inflammation. Findings do not appear to represent appendicitis and there is no evidence of perforation or abscess. Electronically Signed   By: Lavonia Dana M.D.   On: 01/22/2021 11:23   DG Abd Portable 1V-Small Bowel Obstruction Protocol-initial, 8 hr delay  Result Date: 01/23/2021 CLINICAL DATA:  Recent small-bowel obstruction EXAM: PORTABLE ABDOMEN - 1 VIEW COMPARISON:  CT abdomen and pelvis January 22, 2021; abdominal radiograph January 22, 2021 FINDINGS: Nasogastric tube tip and side port in stomach. There is diffuse stool throughout the colon. There is oral contrast in the stomach; oral contrast seen beyond the stomach. There is intravenous contrast in the urinary bladder. There is currently no appreciable bowel dilatation or air-fluid level to suggest bowel obstruction. No free air. Scattered calcifications noted on the left which may have  vascular etiology. Visualized lung bases clear. IMPRESSION: Nasogastric tube tip and side port in stomach. No contrast beyond the stomach. Question a degree of ileus. Diffuse stool throughout colon. No bowel dilatation or air-fluid level to suggest bowel obstruction by radiography. No free air evident. Electronically Signed   By: Lowella Grip III M.D.   On: 01/23/2021 08:24      Scheduled Meds: . enalaprilat  0.625 mg Intravenous Q6H  . enoxaparin (LOVENOX) injection  40 mg Subcutaneous Q24H  . insulin aspart  0-15 Units Subcutaneous TID WC   Continuous Infusions: . sodium chloride 100 mL/hr at 01/22/21 1834     LOS: 1 day      Time spent: 50 minutes   Dessa Phi, DO [facility] 01/23/2021, 12:36 PM   Available via Epic secure chat 7am-7pm After these hours, please refer to coverage provider listed on [email or web address]

## 2021-01-23 NOTE — Progress Notes (Signed)
Inpatient Diabetes Program Recommendations  AACE/ADA: New Consensus Statement on Inpatient Glycemic Control (2015)  Target Ranges:  Prepandial:   less than 140 mg/dL      Peak postprandial:   less than 180 mg/dL (1-2 hours)      Critically ill patients:  140 - 180 mg/dL   Lab Results  Component Value Date   GLUCAP 251 (H) 01/23/2021   HGBA1C 7.3 (H) 01/22/2021    Review of Glycemic Control Results for ROOSEVELT, BISHER (MRN [id number]) as of 01/23/2021 12:05  Ref. Range 01/22/2021 21:01 01/23/2021 07:18 01/23/2021 11:51  Glucose-Capillary Latest Ref Range: 70 - 99 mg/dL 251 (H) 320 (H)  Novolog 11 units given 251 (H)   Diabetes history: DM 2 Outpatient Diabetes medications: metformin 500 mg Daily  Current orders for Inpatient glycemic control: Novolog 0-15 units tid  A1c 7.3% on 3/24   Inpatient Diabetes Program Recommendations:    - Start Lantus 15 units  Thanks,  Tama Headings RN, MSN, BC-ADM Inpatient Diabetes Coordinator Team Pager [phone] (8a-5p)

## 2021-01-23 NOTE — Progress Notes (Addendum)
I contacted Dr. Lorie Apley office and was able to get records from scope last year. Patient had a flex-sig on 06/06/20 and had poor prep. Will formally consult Dr. Collene Mares to see if she would consider scoping during admission given abnormal CT scan findings. Patient has been started on abx by primary team.

## 2021-01-24 ENCOUNTER — Inpatient Hospital Stay (HOSPITAL_COMMUNITY): Payer: Medicare Other

## 2021-01-24 LAB — BASIC METABOLIC PANEL
Anion gap: 12 (ref 5–15)
BUN: 48 mg/dL — ABNORMAL HIGH (ref 8–23)
CO2: 23 mmol/L (ref 22–32)
Calcium: 8.7 mg/dL — ABNORMAL LOW (ref 8.9–10.3)
Chloride: 105 mmol/L (ref 98–111)
Creatinine, Ser: 1.27 mg/dL — ABNORMAL HIGH (ref 0.44–1.00)
GFR, Estimated: 44 mL/min — ABNORMAL LOW (ref >60)
Glucose, Bld: 224 mg/dL — ABNORMAL HIGH (ref 70–99)
Potassium: 3.8 mmol/L (ref 3.5–5.1)
Sodium: 140 mmol/L (ref 135–145)

## 2021-01-24 LAB — URINALYSIS, ROUTINE W REFLEX MICROSCOPIC
Bilirubin Urine: NEGATIVE
Glucose, UA: NEGATIVE mg/dL
Hgb urine dipstick: NEGATIVE
Ketones, ur: NEGATIVE mg/dL
Leukocytes,Ua: NEGATIVE
Nitrite: NEGATIVE
Protein, ur: NEGATIVE mg/dL
Specific Gravity, Urine: 1.024 (ref 1.005–1.030)
pH: 5 (ref 5.0–8.0)

## 2021-01-24 LAB — GLUCOSE, CAPILLARY
Glucose-Capillary: 119 mg/dL — ABNORMAL HIGH (ref 70–99)
Glucose-Capillary: 158 mg/dL — ABNORMAL HIGH (ref 70–99)
Glucose-Capillary: 188 mg/dL — ABNORMAL HIGH (ref 70–99)
Glucose-Capillary: 203 mg/dL — ABNORMAL HIGH (ref 70–99)
Glucose-Capillary: 234 mg/dL — ABNORMAL HIGH (ref 70–99)

## 2021-01-24 LAB — CBC
HCT: 45.2 % (ref 36.0–46.0)
Hemoglobin: 14.9 g/dL (ref 12.0–15.0)
MCH: 29.1 pg (ref 26.0–34.0)
MCHC: 33 g/dL (ref 30.0–36.0)
MCV: 88.3 fL (ref 80.0–100.0)
Platelets: 252 10*3/uL (ref 150–400)
RBC: 5.12 MIL/uL — ABNORMAL HIGH (ref 3.87–5.11)
RDW: 14.8 % (ref 11.5–15.5)
WBC: 7.1 10*3/uL (ref 4.0–10.5)
nRBC: 0 % (ref 0.0–0.2)

## 2021-01-24 IMAGING — DX DG ABD PORTABLE 1V
3 series · 3 of 3 positions shown · non-contrast
Comparison: 01/23/2021 and prior studies

CLINICAL DATA: Abdominal pain and small-bowel obstruction.

EXAM:
PORTABLE ABDOMEN - 1 VIEW

[abdomen supine (1 of 3)]
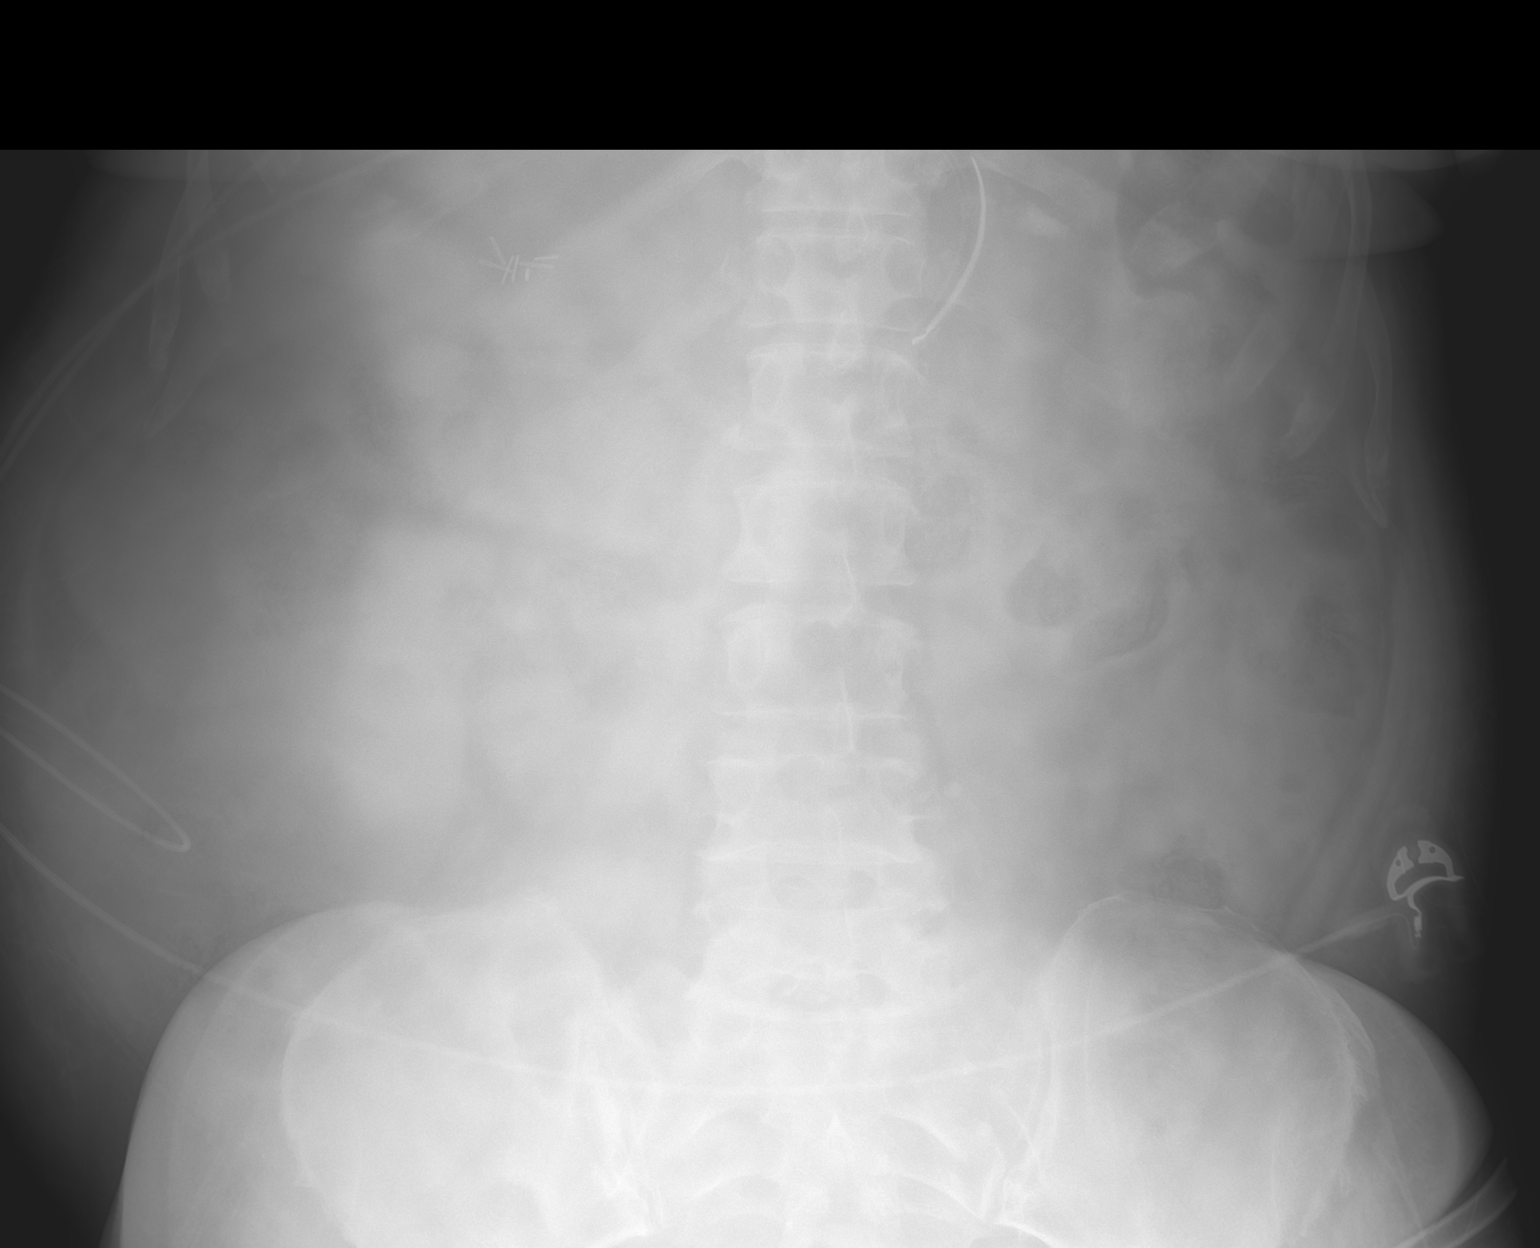

[abdomen supine (2 of 3)]
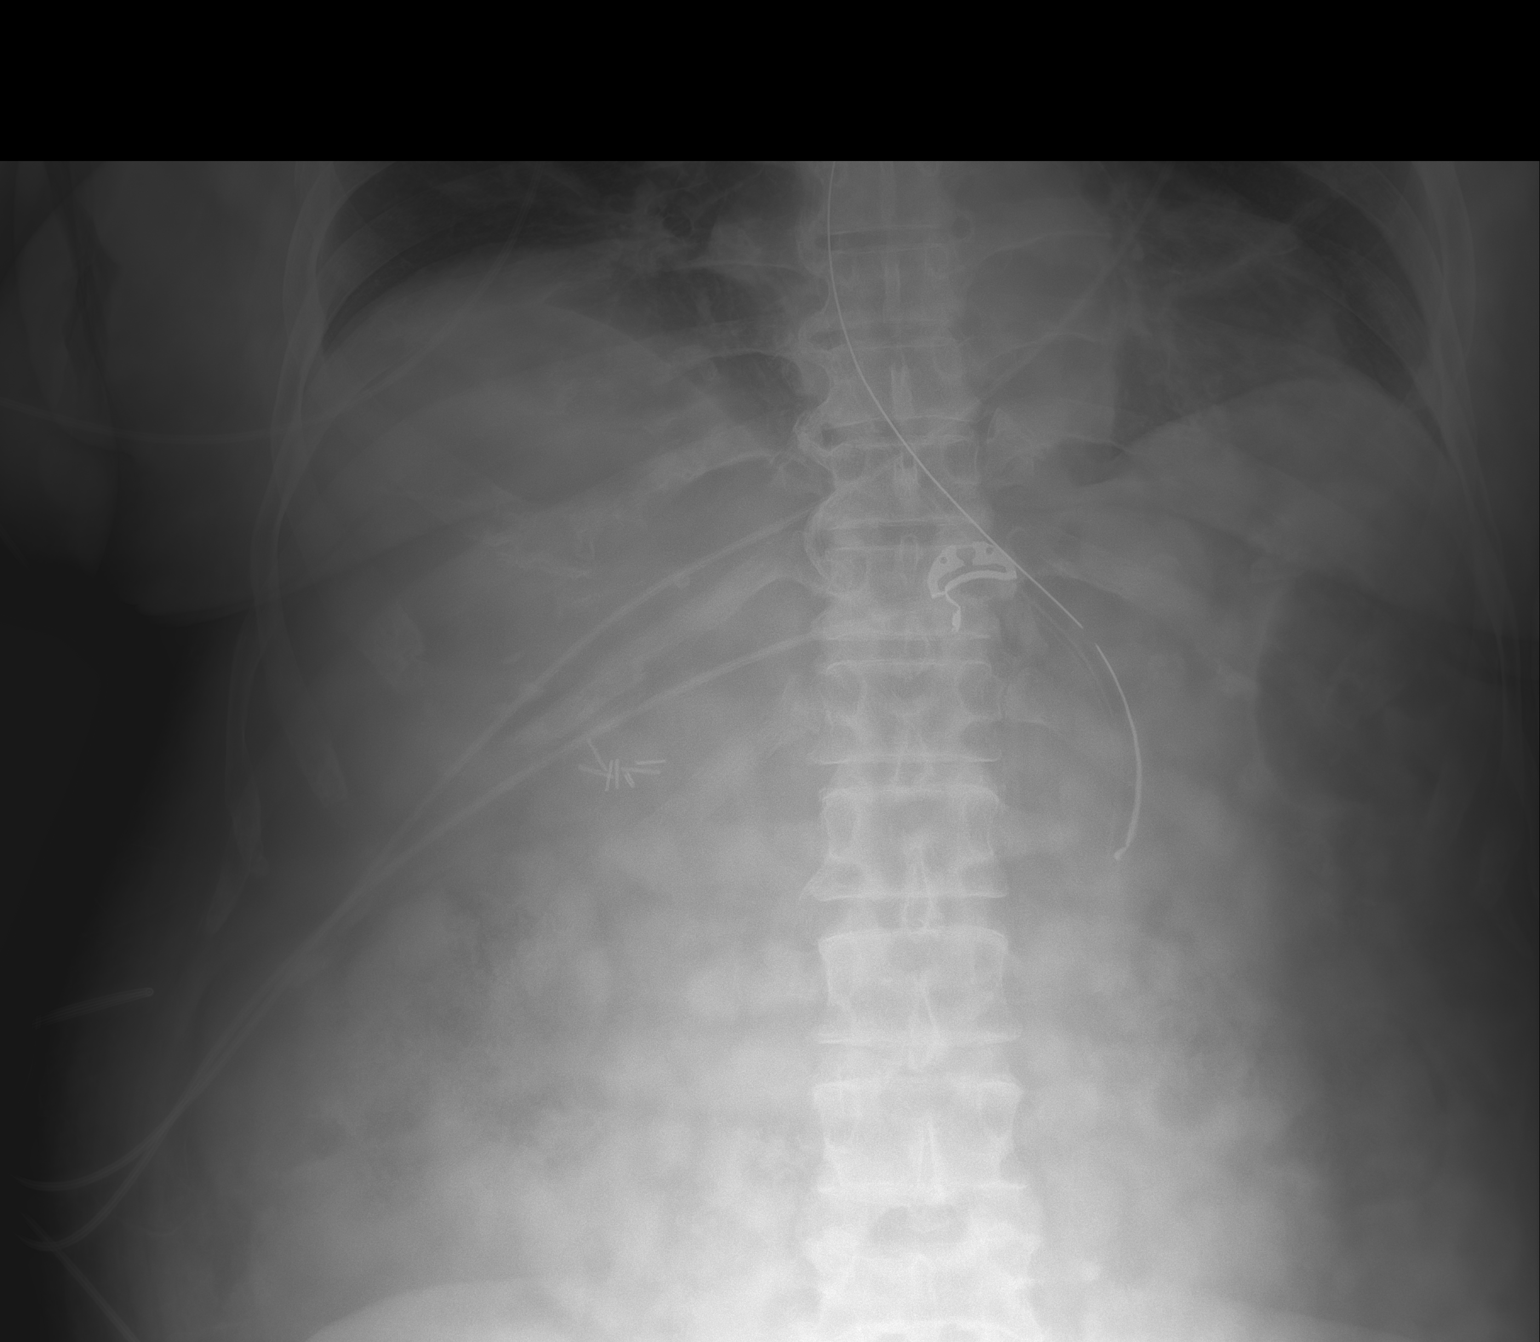

[abdomen supine (3 of 3)]
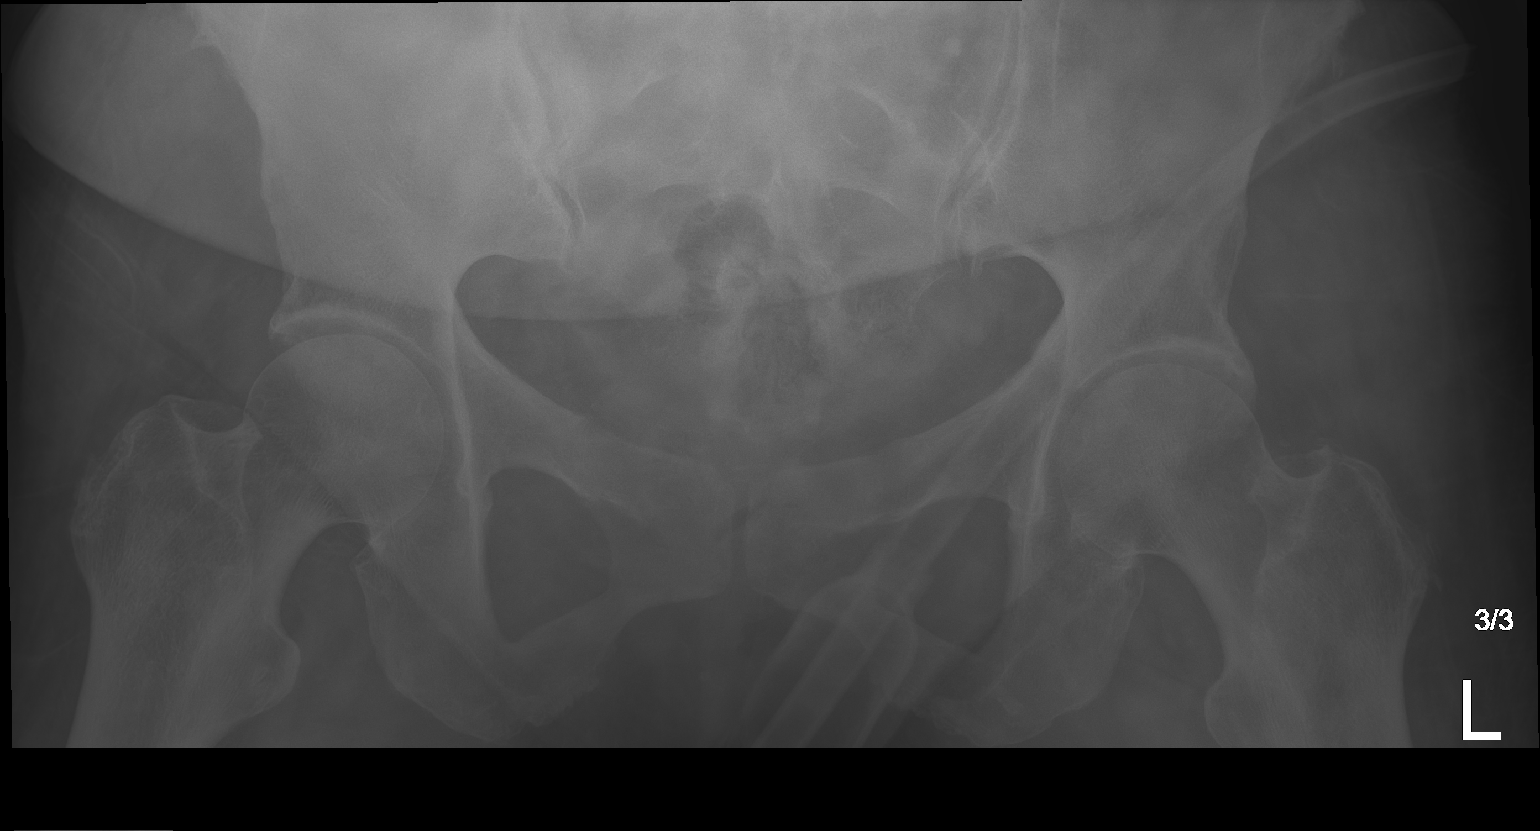

[3 of 3 positions shown; findings below may reference images not displayed]

FINDINGS: NG tube again noted with tip overlying the mid stomach.

Contrast and gas-filled small bowel loops are noted, none which
appear distended now.

No dilated bowel loops are present.
IMPRESSION: Contrast and gas-filled small bowel loops without bowel distention.

## 2021-01-24 IMAGING — US US RENAL
2 series · 14 of 25 positions shown · non-contrast
Comparison: None.

CLINICAL DATA: 74-year-old female with acute kidney injury.

EXAM:
RENAL / URINARY TRACT ULTRASOUND COMPLETE

[Series 1: us renal · 13 of 45 slices shown]
[im 1/45]
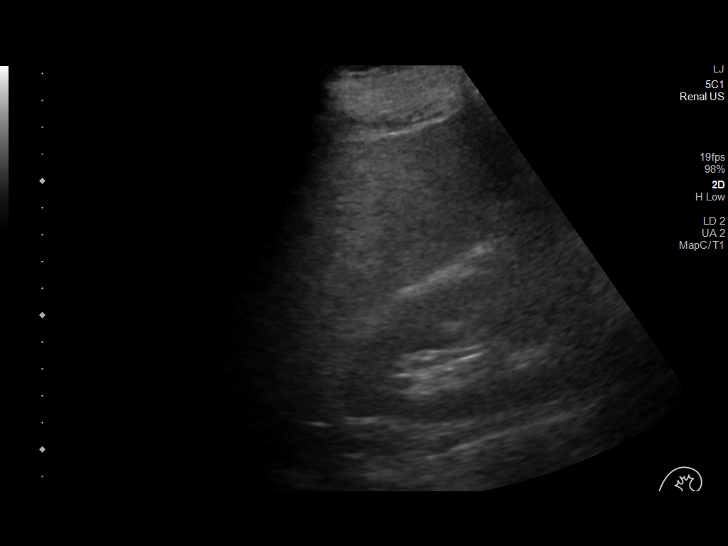
[im 5/45]
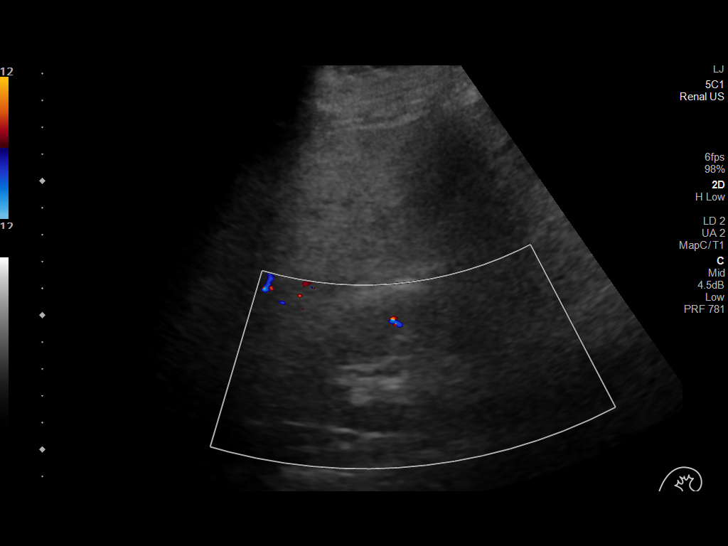
[im 9/45]
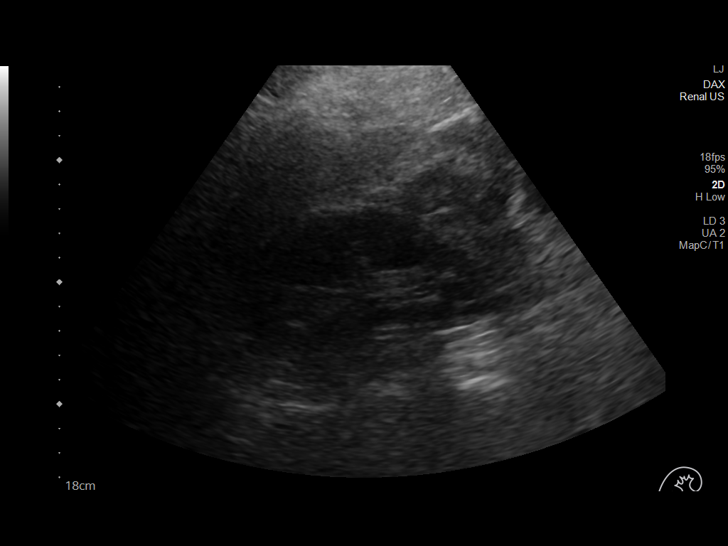
[im 13/45]
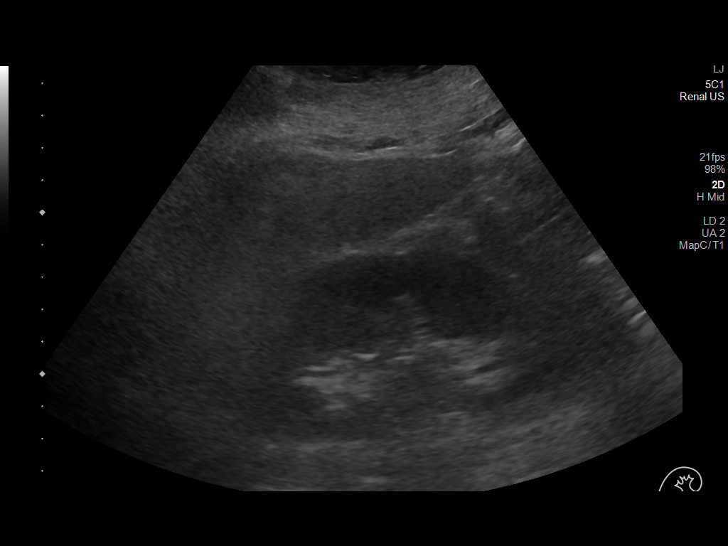
[im 17/45]
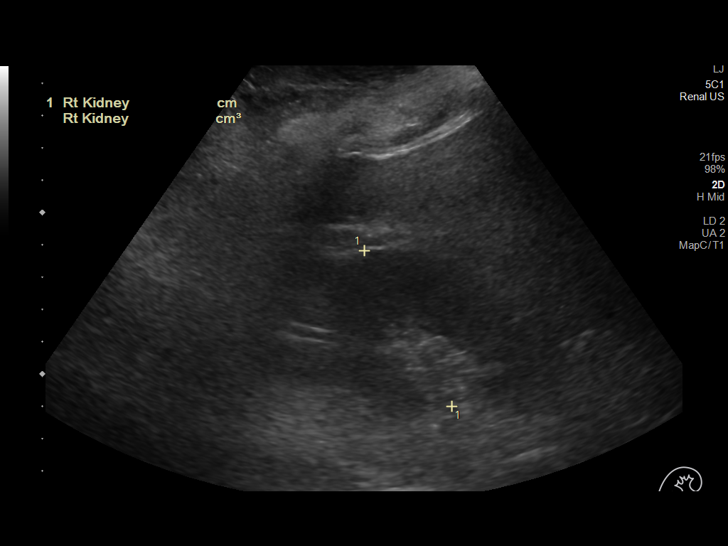
[im 19/45]
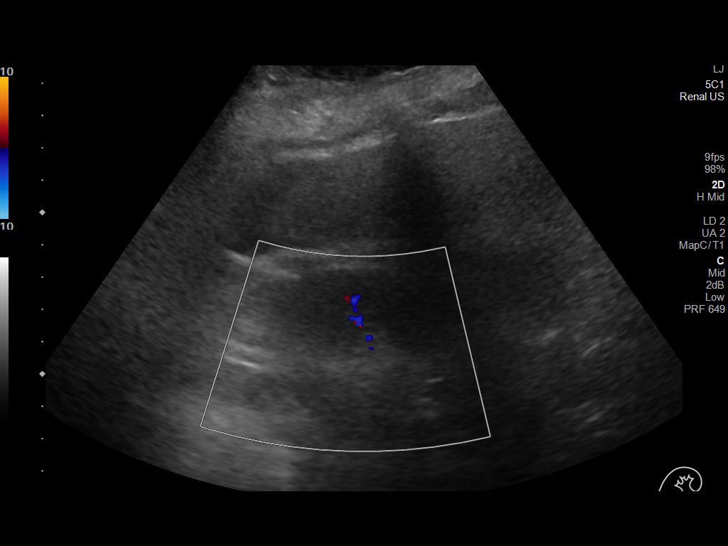
[im 23/45]
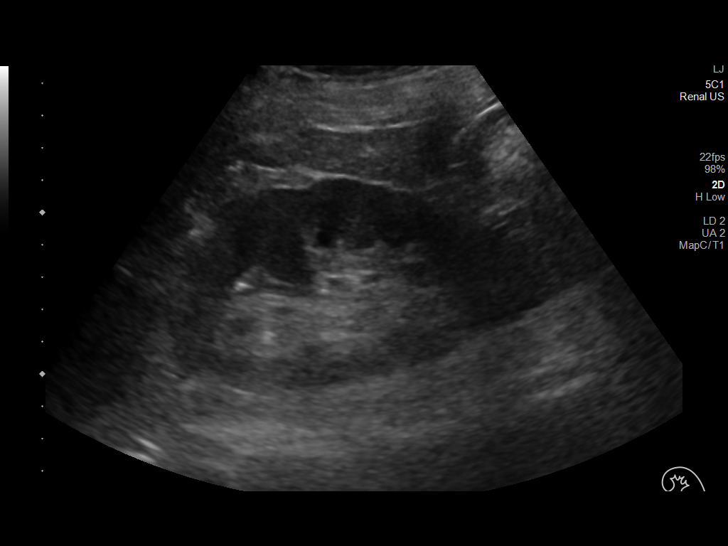
[im 27/45]
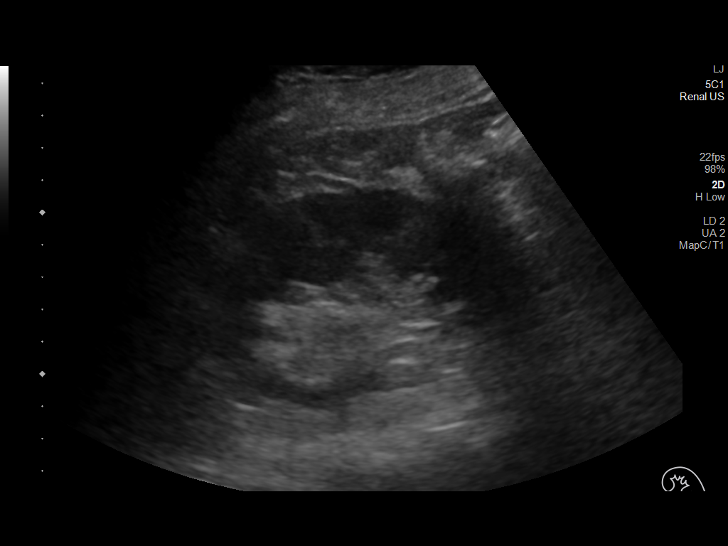
[im 31/45]
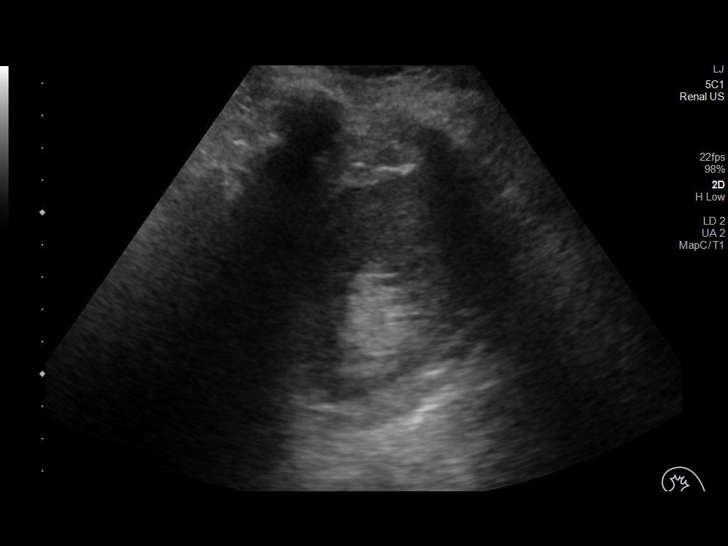
[im 33/45]
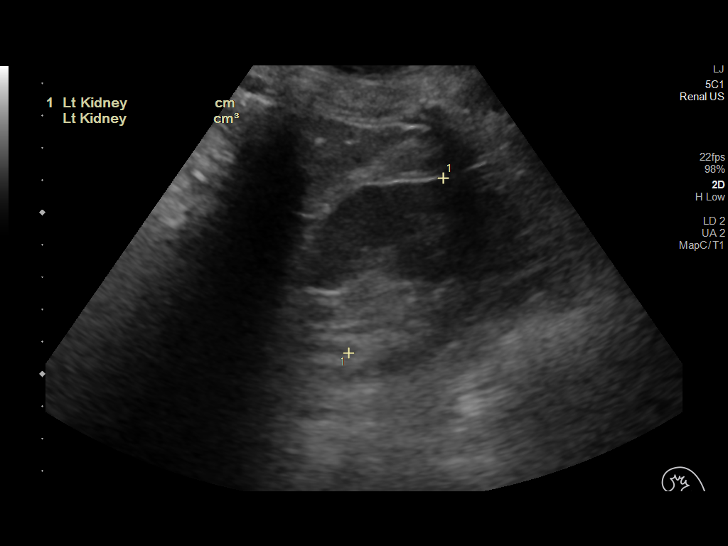
[im 37/45]
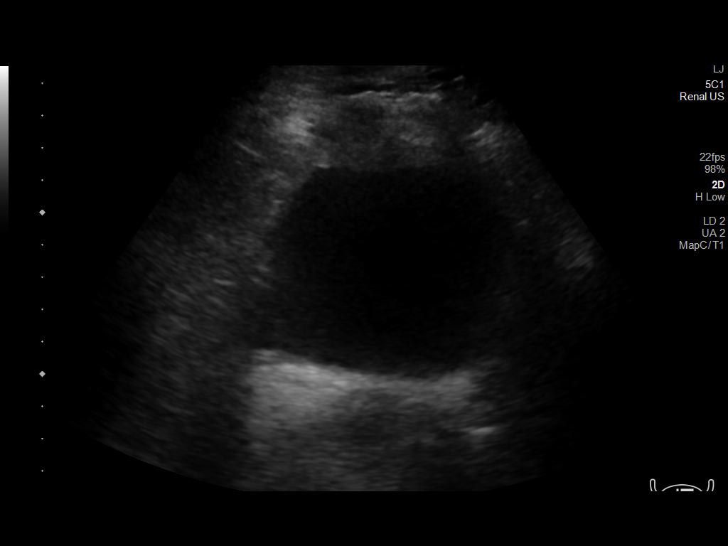
[im 41/45]
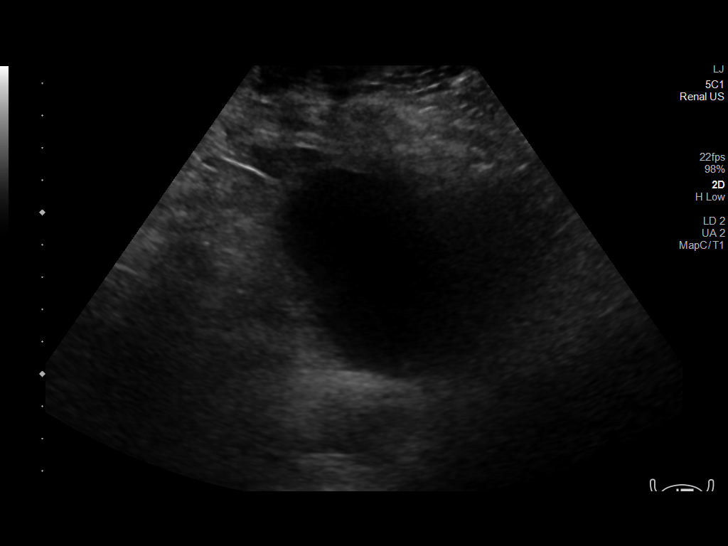
[im 45/45]
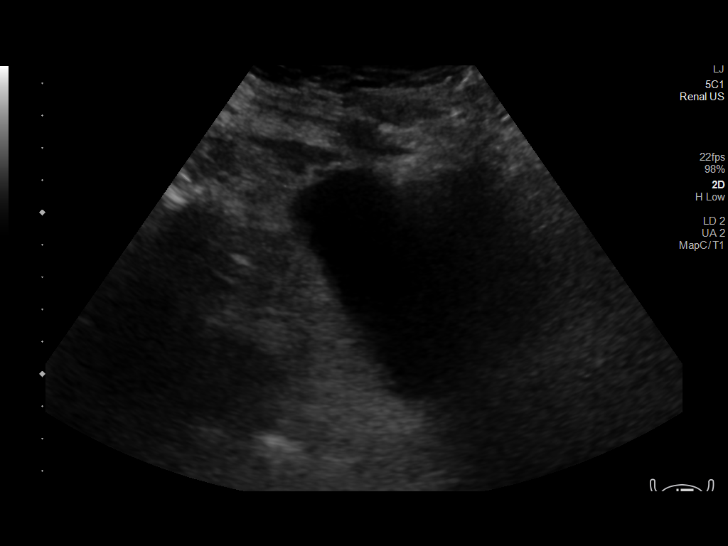

[Series 1001: renal us · 1 of 4 slices shown]
[im 4/4]
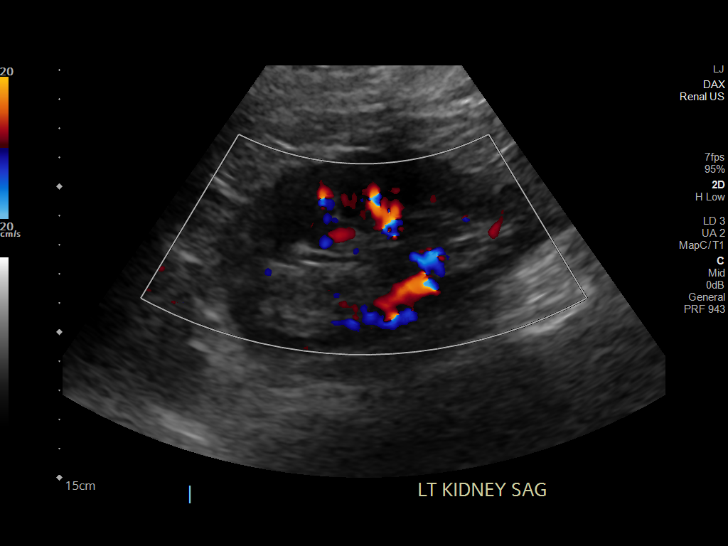

[14 of 25 positions shown; findings below may reference images not displayed]

FINDINGS: Right Kidney:

Renal measurements: 10.9 x 5.2 x 5.5 cm = volume: 163 mL.
Echogenicity within normal limits. No mass or hydronephrosis
visualized.

Left Kidney:

Renal measurements: 11.8 x 6.4 x 6.2 cm = volume: 244 mL.
Echogenicity within normal limits. No mass or hydronephrosis
visualized. Pseudo mass in the UPPER LEFT kidney noted.

Bladder:

Appears normal for degree of bladder distention.

Other:

None.
IMPRESSION: Normal renal ultrasound.

## 2021-01-24 NOTE — Progress Notes (Signed)
Patient ID: Diane [name], female   DOB: Aug 07, 1946, 75 y.o.   MRN: [id number] [facility] Surgery Progress Note:   * No surgery found *  Subjective: Mental status is responsive to nursing.  Complaints --. Objective: Vital signs in last 24 hours: Temp:  [98.1 F (36.7 C)-98.7 F (37.1 C)] 98.7 F (37.1 C) (03/26 0411) Pulse Rate:  [103-116] 109 (03/26 0411) Resp:  [17-20] 19 (03/26 0411) BP: (140-174)/(69-97) 163/82 (03/26 0411) SpO2:  [94 %-98 %] 97 % (03/26 0411)  Intake/Output from previous day: 03/25 0701 - 03/26 0700 In: 0  Out: 550 [Urine:300; Emesis/NG output:250] Intake/Output this shift: No intake/output data recorded.  Physical Exam: Work of breathing is not labored but patient has had spontaneous BMs and is being bathed and changed in bed  Lab Results:  Results for orders placed or performed during the hospital encounter of 01/22/21 (from the past 48 hour(s))  CBC with Differential/Platelet     Status: None   Collection Time: 01/22/21  9:23 AM  Result Value Ref Range   WBC 8.6 4.0 - 10.5 K/uL   RBC 4.87 3.87 - 5.11 MIL/uL   Hemoglobin 14.3 12.0 - 15.0 g/dL   HCT 44.1 36.0 - 46.0 %   MCV 90.6 80.0 - 100.0 fL   MCH 29.4 26.0 - 34.0 pg   MCHC 32.4 30.0 - 36.0 g/dL   RDW 14.6 11.5 - 15.5 %   Platelets 232 150 - 400 K/uL   nRBC 0.0 0.0 - 0.2 %   Neutrophils Relative % 84 %   Neutro Abs 7.2 1.7 - 7.7 K/uL   Lymphocytes Relative 10 %   Lymphs Abs 0.8 0.7 - 4.0 K/uL   Monocytes Relative 6 %   Monocytes Absolute 0.5 0.1 - 1.0 K/uL   Eosinophils Relative 0 %   Eosinophils Absolute 0.0 0.0 - 0.5 K/uL   Basophils Relative 0 %   Basophils Absolute 0.0 0.0 - 0.1 K/uL   Immature Granulocytes 0 %   Abs Immature Granulocytes 0.03 0.00 - 0.07 K/uL    Comment: Performed at [facility], [place] , [place] [address]  Comprehensive metabolic panel     Status: Abnormal   Collection Time: 01/22/21  9:23 AM  Result Value Ref Range   Sodium 137  135 - 145 mmol/L   Potassium 3.9 3.5 - 5.1 mmol/L   Chloride 100 98 - 111 mmol/L   CO2 27 22 - 32 mmol/L   Glucose, Bld 225 (H) 70 - 99 mg/dL    Comment: Glucose reference range applies only to samples taken after fasting for at least 8 hours.   BUN 8 8 - 23 mg/dL   Creatinine, Ser 0.60 0.44 - 1.00 mg/dL   Calcium 9.1 8.9 - 10.3 mg/dL   Total Protein 7.8 6.5 - 8.1 g/dL   Albumin 3.9 3.5 - 5.0 g/dL   AST 22 15 - 41 U/L   ALT 16 0 - 44 U/L   Alkaline Phosphatase 55 38 - 126 U/L   Total Bilirubin 0.4 0.3 - 1.2 mg/dL   GFR, Estimated >60 >60 mL/min    Comment: (NOTE) Calculated using the CKD-EPI Creatinine Equation (2021)    Anion gap 10 5 - 15    Comment: Performed at [facility], [address] [place], [place] [address]  Lipase, blood     Status: None   Collection Time: 01/22/21  9:23 AM  Result Value Ref Range  Lipase 42 11 - 51 U/L    Comment: Performed at [facility], [address] [place], [place] [address]  Resp Panel by RT-PCR (Flu A&B, Covid) Nasopharyngeal Swab     Status: None   Collection Time: 01/22/21  3:40 PM   Specimen: Nasopharyngeal Swab; Nasopharyngeal(NP) swabs in vial transport medium  Result Value Ref Range   SARS Coronavirus 2 by RT PCR NEGATIVE NEGATIVE    Comment: (NOTE) SARS-CoV-2 target nucleic acids are NOT DETECTED.  The SARS-CoV-2 RNA is generally detectable in upper respiratory specimens during the acute phase of infection. The lowest concentration of SARS-CoV-2 viral copies this assay can detect is 138 copies/mL. A negative result does not preclude SARS-Cov-2 infection and should not be used as the sole basis for treatment or other patient management decisions. A negative result may occur with  improper specimen collection/handling, submission of specimen other than nasopharyngeal swab, presence of viral mutation(s) within the areas targeted by this assay, and inadequate number of viral copies(<138 copies/mL). A  negative result must be combined with clinical observations, patient history, and epidemiological information. The expected result is Negative.  Fact Sheet for Patients:  EntrepreneurPulse.com.au  Fact Sheet for Healthcare Providers:  IncredibleEmployment.be  This test is no t yet approved or cleared by the [place] FDA and  has been authorized for detection and/or diagnosis of SARS-CoV-2 by FDA under an Emergency Use Authorization (EUA). This EUA will remain  in effect (meaning this test can be used) for the duration of the COVID-19 declaration under Section 564(b)(1) of the Act, 21 U.S.C.section 360bbb-3(b)(1), unless the authorization is terminated  or revoked sooner.       Influenza A by PCR NEGATIVE NEGATIVE   Influenza B by PCR NEGATIVE NEGATIVE    Comment: (NOTE) The Xpert Xpress SARS-CoV-2/FLU/RSV plus assay is intended as an aid in the diagnosis of influenza from Nasopharyngeal swab specimens and should not be used as a sole basis for treatment. Nasal washings and aspirates are unacceptable for Xpert Xpress SARS-CoV-2/FLU/RSV testing.  Fact Sheet for Patients: EntrepreneurPulse.com.au  Fact Sheet for Healthcare Providers: IncredibleEmployment.be  This test is not yet approved or cleared by the [place] FDA and has been authorized for detection and/or diagnosis of SARS-CoV-2 by FDA under an Emergency Use Authorization (EUA). This EUA will remain in effect (meaning this test can be used) for the duration of the COVID-19 declaration under Section 564(b)(1) of the Act, 21 U.S.C. section 360bbb-3(b)(1), unless the authorization is terminated or revoked.  Performed at [facility], [address] [place], [place] [address]   Hemoglobin A1c     Status: Abnormal   Collection Time: 01/22/21  5:53 PM  Result Value Ref Range   Hgb A1c MFr Bld 7.3 (H) 4.8 - 5.6 %    Comment:  (NOTE) Pre diabetes:          5.7%-6.4%  Diabetes:              >6.4%  Glycemic control for   <7.0% adults with diabetes    Mean Plasma Glucose 162.81 mg/dL    Comment: Performed at [facility] [address] [place], [place] [address]  Glucose, capillary     Status: Abnormal   Collection Time: 01/22/21  9:01 PM  Result Value Ref Range   Glucose-Capillary 251 (H) 70 - 99 mg/dL    Comment: Glucose reference range applies only to samples taken after fasting for at least 8 hours.  Basic metabolic panel     Status: Abnormal   Collection Time: 01/23/21 12:59 AM  Result Value Ref Range   Sodium 137 135 - 145 mmol/L   Potassium 4.1 3.5 - 5.1 mmol/L   Chloride 104 98 - 111 mmol/L   CO2 17 (L) 22 - 32 mmol/L   Glucose, Bld 316 (H) 70 - 99 mg/dL    Comment: Glucose reference range applies only to samples taken after fasting for at least 8 hours.   BUN 18 8 - 23 mg/dL   Creatinine, Ser 1.03 (H) 0.44 - 1.00 mg/dL   Calcium 9.0 8.9 - 10.3 mg/dL   GFR, Estimated 57 (L) >60 mL/min    Comment: (NOTE) Calculated using the CKD-EPI Creatinine Equation (2021)    Anion gap 16 (H) 5 - 15    Comment: Performed at [facility] [address], [place], [place] [address]  CBC     Status: Abnormal   Collection Time: 01/23/21 12:59 AM  Result Value Ref Range   WBC 14.9 (H) 4.0 - 10.5 K/uL   RBC 6.06 (H) 3.87 - 5.11 MIL/uL   Hemoglobin 17.7 (H) 12.0 - 15.0 g/dL   HCT 53.7 (H) 36.0 - 46.0 %   MCV 88.6 80.0 - 100.0 fL   MCH 29.2 26.0 - 34.0 pg   MCHC 33.0 30.0 - 36.0 g/dL   RDW 14.6 11.5 - 15.5 %   Platelets 311 150 - 400 K/uL   nRBC 0.0 0.0 - 0.2 %    Comment: Performed at [facility], [place] [address], [place], [place] [address]  Lactic acid, plasma     Status: Abnormal   Collection Time: 01/23/21 12:59 AM  Result Value Ref Range   Lactic Acid, Venous 4.2 (HH) 0.5 - 1.9 mmol/L    Comment: CRITICAL RESULT CALLED TO, READ BACK BY AND VERIFIED WITH: Marijean Heath 01/23/21 0148  WAYK Performed at [facility], [place] [address], [place], [place] [address]   Glucose, capillary     Status: Abnormal   Collection Time: 01/23/21  7:18 AM  Result Value Ref Range   Glucose-Capillary 320 (H) 70 - 99 mg/dL    Comment: Glucose reference range applies only to samples taken after fasting for at least 8 hours.  Glucose, capillary     Status: Abnormal   Collection Time: 01/23/21 11:51 AM  Result Value Ref Range   Glucose-Capillary 251 (H) 70 - 99 mg/dL    Comment: Glucose reference range applies only to samples taken after fasting for at least 8 hours.  Lactic acid, plasma     Status: Abnormal   Collection Time: 01/23/21  1:35 PM  Result Value Ref Range   Lactic Acid, Venous 3.5 (HH) 0.5 - 1.9 mmol/L    Comment: CRITICAL RESULT CALLED TO, READ BACK BY AND VERIFIED WITH: [facility] RN @ 1430 01/23/21 LEONARD,A Performed at [facility], [place] [address], [place], [place] [address]   Lactic acid, plasma     Status: Abnormal   Collection Time: 01/23/21  4:06 PM  Result Value Ref Range   Lactic Acid, Venous 2.8 (HH) 0.5 - 1.9 mmol/L    Comment: CRITICAL VALUE NOTED.  VALUE IS CONSISTENT WITH PREVIOUSLY REPORTED AND CALLED VALUE. Performed at [facility], [place] [address], [place], [place] [address]   Glucose, capillary     Status: Abnormal   Collection Time: 01/23/21  5:12 PM  Result Value Ref Range  Glucose-Capillary 203 (H) 70 - 99 mg/dL    Comment: Glucose reference range applies only to samples taken after fasting for at least 8 hours.  Glucose, capillary     Status: Abnormal   Collection Time: 01/23/21  8:56 PM  Result Value Ref Range   Glucose-Capillary 222 (H) 70 - 99 mg/dL    Comment: Glucose reference range applies only to samples taken after fasting for at least 8 hours.  Glucose, capillary     Status: Abnormal   Collection Time: 01/24/21 12:00 AM  Result Value Ref Range   Glucose-Capillary 234 (H) 70 - 99 mg/dL    Comment: Glucose reference range  applies only to samples taken after fasting for at least 8 hours.  CBC     Status: Abnormal   Collection Time: 01/24/21  3:21 AM  Result Value Ref Range   WBC 7.1 4.0 - 10.5 K/uL   RBC 5.12 (H) 3.87 - 5.11 MIL/uL   Hemoglobin 14.9 12.0 - 15.0 g/dL   HCT 45.2 36.0 - 46.0 %   MCV 88.3 80.0 - 100.0 fL   MCH 29.1 26.0 - 34.0 pg   MCHC 33.0 30.0 - 36.0 g/dL   RDW 14.8 11.5 - 15.5 %   Platelets 252 150 - 400 K/uL   nRBC 0.0 0.0 - 0.2 %    Comment: Performed at [facility], [place] [address] [place], [place] [address]  Basic metabolic panel     Status: Abnormal   Collection Time: 01/24/21  3:21 AM  Result Value Ref Range   Sodium 140 135 - 145 mmol/L   Potassium 3.8 3.5 - 5.1 mmol/L   Chloride 105 98 - 111 mmol/L   CO2 23 22 - 32 mmol/L   Glucose, Bld 224 (H) 70 - 99 mg/dL    Comment: Glucose reference range applies only to samples taken after fasting for at least 8 hours.   BUN 48 (H) 8 - 23 mg/dL   Creatinine, Ser 1.27 (H) 0.44 - 1.00 mg/dL   Calcium 8.7 (L) 8.9 - 10.3 mg/dL   GFR, Estimated 44 (L) >60 mL/min    Comment: (NOTE) Calculated using the CKD-EPI Creatinine Equation (2021)    Anion gap 12 5 - 15    Comment: Performed at [facility] [address], [place], [place] [address]  Glucose, capillary     Status: Abnormal   Collection Time: 01/24/21  6:03 AM  Result Value Ref Range   Glucose-Capillary 203 (H) 70 - 99 mg/dL    Comment: Glucose reference range applies only to samples taken after fasting for at least 8 hours.  Urinalysis, Routine w reflex microscopic Urine, Clean Catch     Status: None   Collection Time: 01/24/21  8:05 AM  Result Value Ref Range   Color, Urine YELLOW YELLOW   APPearance CLEAR CLEAR   Specific Gravity, Urine 1.024 1.005 - 1.030   pH 5.0 5.0 - 8.0   Glucose, UA NEGATIVE NEGATIVE mg/dL   Hgb urine dipstick NEGATIVE NEGATIVE   Bilirubin Urine NEGATIVE NEGATIVE   Ketones, ur NEGATIVE NEGATIVE mg/dL   Protein, ur NEGATIVE NEGATIVE  mg/dL   Nitrite NEGATIVE NEGATIVE   Leukocytes,Ua NEGATIVE NEGATIVE    Comment: Performed at [facility] [address], [place], [place] [address]    Radiology/Results: DG Abdomen 1 View  Result Date: 01/22/2021 CLINICAL DATA:  Enteric catheter placement EXAM: ABDOMEN - 1 VIEW COMPARISON:  01/22/2021 FINDINGS: Frontal view of  the lower chest and upper abdomen demonstrates enteric catheter passing below diaphragm tip and side port projecting over the gastric body. Lung bases are clear. Moderate stool within the colon. IMPRESSION: 1. Enteric catheter overlying gastric body. Electronically Signed   By: Randa Ngo M.D.   On: 01/22/2021 15:25   CT Abdomen Pelvis W Contrast  Result Date: 01/22/2021 CLINICAL DATA:  Nausea, vomiting, mid upper abdominal pain since last night, history hypertension, diabetes mellitus, GERD, hysterectomy, cholecystectomy, umbilical hernia repair EXAM: CT ABDOMEN AND PELVIS WITH CONTRAST TECHNIQUE: Multidetector CT imaging of the abdomen and pelvis was performed using the standard protocol following bolus administration of intravenous contrast. Sagittal and coronal MPR images reconstructed from axial data set. CONTRAST:  169mL OMNIPAQUE IOHEXOL 300 MG/ML SOLN IV. No oral contrast. COMPARISON:  04/02/2020 FINDINGS: Lower chest: Bibasilar atelectasis Hepatobiliary: Gallbladder surgically absent. Fatty infiltration of liver. No focal hepatic mass. Pancreas: Normal appearance Spleen: Normal appearance. Question tiny splenules versus normal sized lymph nodes Adrenals/Urinary Tract: Thickening of adrenal glands without discrete mass. Kidneys, ureters, and bladder normal appearance Stomach/Bowel: Base of appendix is minimally thickened but remainder of appendix is normal caliber without inflammatory changes. Dilated small loops distally, some containing fluid but most distal loops contain stool sign. Associated edema within some of the mesentery of the fluid filled small  bowel loops. Dilated small bowel loops with stool extend to the ileocecal valve. Prominent stool is seen throughout the colon. Mild wall thickening of the cecum and ascending colon. Stomach and remaining bowel loops unremarkable. Vascular/Lymphatic: Vascular structures patent. Atherosclerotic calcifications at the origins of the renal arteries and at aortic bifurcation. Aorta normal caliber. Few scattered normal size mesenteric and retroperitoneal lymph nodes without adenopathy. Reproductive: Uterus surgically absent. Nonvisualization of ovaries. Other: Free fluid in pelvis, interloop, perihepatic, and minimally at RIGHT gutter. No free air. No hernia. Musculoskeletal: Osseous structures unremarkable. IMPRESSION: Partial small bowel obstruction, with dilated fluid-filled loops of small bowel as well as stool sign within distended distal small bowel loops extending to the ileocecal valve. Associated mesenteric edema within some of the fluid-filled small bowel loops in the RIGHT mid abdomen as well as mild wall thickening of the cecum. Thickening of base of appendix though the remainder of the appendix appears normal. Differential diagnosis would include stricture at the ileocecal valve with partial small bowel obstruction, inflammatory bowel disease involving the cecum and ileocecal valve with partial obstruction versus functional obstruction, or partial small bowel obstruction secondary to adhesions with associated edema and inflammation. Findings do not appear to represent appendicitis and there is no evidence of perforation or abscess. Electronically Signed   By: Lavonia Dana M.D.   On: 01/22/2021 11:23   DG CHEST PORT 1 VIEW  Result Date: 01/23/2021 CLINICAL DATA:  Respiratory failure EXAM: PORTABLE CHEST 1 VIEW COMPARISON:  04/02/2020 FINDINGS: Low volume AP portable examination with perhaps minimal bibasilar atelectasis. Mild cardiomegaly. Esophagogastric tube with tip and side port below the diaphragm.  IMPRESSION: 1. Low volume AP portable examination with perhaps minimal bibasilar atelectasis. 2. Mild cardiomegaly. Electronically Signed   By: Eddie Candle M.D.   On: 01/23/2021 14:44   DG Abd Portable 1V-Small Bowel Obstruction Protocol-24 hr delay  Result Date: 01/23/2021 CLINICAL DATA:  Abdominal pain, small-bowel obstruction EXAM: PORTABLE ABDOMEN - 1 VIEW COMPARISON:  01/23/2021 at 6:43 a.m. FINDINGS: Supine frontal views of the abdomen and pelvis are obtained. Enteric catheter tip and side port project over the gastric body. Moderate stool throughout the colon. Distended gas-filled loops  of small bowel are seen within the central abdomen, measuring up to 3.4 cm in diameter. The previously administered oral contrast is too dilute to be visualized. No acute bony abnormalities. IMPRESSION: 1. Persisting gaseous distension of small bowel consistent with ileus or obstruction. The previously administered oral contrast is too dilute to be identified on this exam. 2. Moderate stool throughout the colon. Electronically Signed   By: Randa Ngo M.D.   On: 01/23/2021 23:29   DG Abd Portable 1V-Small Bowel Obstruction Protocol-initial, 8 hr delay  Result Date: 01/23/2021 CLINICAL DATA:  Recent small-bowel obstruction EXAM: PORTABLE ABDOMEN - 1 VIEW COMPARISON:  CT abdomen and pelvis January 22, 2021; abdominal radiograph January 22, 2021 FINDINGS: Nasogastric tube tip and side port in stomach. There is diffuse stool throughout the colon. There is oral contrast in the stomach; oral contrast seen beyond the stomach. There is intravenous contrast in the urinary bladder. There is currently no appreciable bowel dilatation or air-fluid level to suggest bowel obstruction. No free air. Scattered calcifications noted on the left which may have vascular etiology. Visualized lung bases clear. IMPRESSION: Nasogastric tube tip and side port in stomach. No contrast beyond the stomach. Question a degree of ileus. Diffuse stool  throughout colon. No bowel dilatation or air-fluid level to suggest bowel obstruction by radiography. No free air evident. Electronically Signed   By: Lowella Grip III M.D.   On: 01/23/2021 08:24    Anti-infectives: Anti-infectives (From admission, onward)   Start     Dose/Rate Route Frequency Ordered Stop   01/23/21 1400  piperacillin-tazobactam (ZOSYN) IVPB 3.375 g        3.375 g 12.5 mL/hr over 240 Minutes Intravenous Every 8 hours 01/23/21 1244        Assessment/Plan: Problem List: Patient Active Problem List   Diagnosis Date Noted  . AKI (acute kidney injury) ([place]) 01/23/2021  . Rheumatoid arthritis ([place]) 01/23/2021  . SBO (small bowel obstruction) ([place]) 01/22/2021  . Postoperative anemia due to acute blood loss 06/18/2018  . Type 2 diabetes mellitus with diabetic neuropathy, unspecified ([place]) 06/18/2018  . Peripheral neuropathy 06/18/2018  . 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 Gait difficulty 06/18/2018  . Community acquired pneumonia of right lower lobe of lung 06/11/2018  . S/P shoulder replacement, right 06/02/2018  . Prolonged Q-T interval on ECG 04/20/2017  . Pulmonary edema 04/20/2017  . Chest pain 10/18/2013  . Abnormal nuclear stress test 10/18/2013  . Arm pain 10/18/2013  . Hypertension     Hopefully the impaction is clearing and her bowels are moving again.   * No surgery found *    LOS: 2 days   Matt B. Hassell Done, MD, [facility] [phone] to reach the surgeon on call.    01/24/2021 8:55 AM

## 2021-01-24 NOTE — Progress Notes (Signed)
PROGRESS NOTE    MAGALIE ALMON  KZS:[id number] DOB: 05-09-1946 DOA: 01/22/2021 PCP: [name], MD     Brief Narrative:  Diane [name] is a 75 y.o. female with medical history significant of HTN, IDDM, rheumatoid arthritis, anxiety, depression, mild cognitive deficit who presented with new onset of abdominal pain, nausea, vomiting. Her symptoms started last night after dinner.  Abdominal pain vaguely localized on the right aspect near umbilicus, pain has been cramping intermittent but becomes more constant this morning. Abdominal x-ray showed small bowel perforation, CT abdomen showed dilated bowels, suspicious for ileus cecal wall stricture.  General surgery was consulted.  New events last 24 hours / Subjective: Patient is more interactive today, although remains pretty lethargic and fatigued overall.  She is alert and oriented x3, but slow to answer most questions.  She continues to admit to generalized abdominal pain and nausea.  Assessment & Plan:   Principal Problem:   SBO (small bowel obstruction) (HCC) Active Problems:   Prolonged Q-T interval on ECG   Type 2 diabetes mellitus with diabetic neuropathy, unspecified (HCC)   Peripheral neuropathy   AKI (acute kidney injury) ([place])   Rheumatoid arthritis (HCC)   pSBO -CT A/P: Partial small bowel obstruction, with dilated fluid-filled loops of small bowel as well as stool sign within distended distal small bowel loops extending to the ileocecal valve. Associated mesenteric edema within some of the fluid-filled small bowel loops in the RIGHT mid abdomen as well as mild wall thickening of the cecum. Thickening of base of appendix though the remainder of the appendix appears normal. Findings do not appear to represent appendicitis and there is no evidence of perforation or abscess. -Continue NGT, n.p.o., IV fluid -General surgery, GI following -Trial of Solu-Medrol due to possibility of inflammatory component  Severe sepsis,  not present on admission, question abdominal source -Patient was afebrile, HR 81, RR 18, WBC 8.6 at the time of admission.  This morning, patient remains tachycardic with leukocytosis 14.9 and lactic acidosis 4.2 with AKI -Covid, influenza negative -UA negative -Chest x-ray unremarkable -Blood cultures pending -Continue empiric IV Zosyn to cover intra-abdominal source -Leukocytosis now resolved, lactic acid decreased  Acute hypoxemic respiratory failure -Patient with SPO2 89% on room air, currently on 2 L nasal cannula O2, wean as able -Chest x-ray unremarkable  AKI -Presented with creatinine 0.6, up to 1.27 today -Continue to trend BMP, continue IV fluid -Check renal ultrasound  Diabetes mellitus, insulin-dependent, poorly controlled with hyperglycemia -Hemoglobin A1c 7.3 -Hold home Metformin, Neurontin -Continue Lantus, continue sliding scale insulin  Hypertension -Hold home medication including Norvasc, telmisartan while n.p.o.  Rheumatoid arthritis -Hold home Plaquenil, Norco, methotrexate  Anxiety/depression -Hold home Cymbalta  Mild cognitive deficit -Per daughter, patient has had some memory loss over the years, possibly early beginning of dementia.  Patient does live at home alone and is independent of ADLs at baseline  Prolonged QTc -Repeat EKG reviewed independently reveals QTC 500   DVT prophylaxis:  enoxaparin (LOVENOX) injection 40 mg Start: 01/23/21 1000  Code Status: Full code Family Communication: No family at bedside Disposition Plan:  Status is: Inpatient  Remains inpatient appropriate because:IV treatments appropriate due to intensity of illness or inability to take PO and Inpatient level of care appropriate due to severity of illness   Dispo: The patient is from: Home              Anticipated d/c is to: Home  Patient currently is not medically stable to d/c.  Continue treatment for SBO, continue IV fluid and IV Solu-Medrol    Difficult to place patient No      Consultants:  General surgery GI  Antimicrobials:  Anti-infectives (From admission, onward)   Start     Dose/Rate Route Frequency Ordered Stop   01/23/21 1400  piperacillin-tazobactam (ZOSYN) IVPB 3.375 g        3.375 g 12.5 mL/hr over 240 Minutes Intravenous Every 8 hours 01/23/21 1244         Objective: Vitals:   01/23/21 2221 01/24/21 0225 01/24/21 0241 01/24/21 0411  BP: 140/90 (!) 174/90 (!) 172/90 (!) 163/82  Pulse: (!) 108 (!) 103 (!) 103 (!) 109  Resp: 20 17 18 19   Temp: 98.6 F (37 C)  98.4 F (36.9 C) 98.7 F (37.1 C)  TempSrc: Oral  Oral Oral  SpO2: 94% 95% 95% 97%  Weight:      Height:        Intake/Output Summary (Last 24 hours) at 01/24/2021 1123 Last data filed at 01/24/2021 0800 Gross per 24 hour  Intake 0 ml  Output 250 ml  Net -250 ml   Filed Weights   01/22/21 0847  Weight: 98.4 kg    Examination: General exam: Appears calm  Respiratory system: Clear to auscultation anteriorly. Respiratory effort normal. Cardiovascular system: S1 & S2 heard, RRR. No pedal edema. Gastrointestinal system: Abdomen is mildly distended, soft, tender to palpation, NG tube in place Central nervous system: Alert and oriented. Non focal exam. Extremities: Symmetric in appearance bilaterally  Skin: No rashes, lesions or ulcers on exposed skin  Psychiatry: Stable   Data Reviewed: I have personally reviewed following labs and imaging studies  CBC: Recent Labs  Lab 01/22/21 0923 01/23/21 0059 01/24/21 0321  WBC 8.6 14.9* 7.1  NEUTROABS 7.2  --   --   HGB 14.3 17.7* 14.9  HCT 44.1 53.7* 45.2  MCV 90.6 88.6 88.3  PLT 232 311 676   Basic Metabolic Panel: Recent Labs  Lab 01/22/21 0923 01/23/21 0059 01/24/21 0321  NA 137 137 140  K 3.9 4.1 3.8  CL 100 104 105  CO2 27 17* 23  GLUCOSE 225* 316* 224*  BUN 8 18 48*  CREATININE 0.60 1.03* 1.27*  CALCIUM 9.1 9.0 8.7*   GFR: Estimated Creatinine Clearance: 47.7  mL/min (A) (by C-G formula based on SCr of 1.27 mg/dL (H)). Liver Function Tests: Recent Labs  Lab 01/22/21 0923  AST 22  ALT 16  ALKPHOS 55  BILITOT 0.4  PROT 7.8  ALBUMIN 3.9   Recent Labs  Lab 01/22/21 0923  LIPASE 42   No results for input(s): AMMONIA in the last 168 hours. Coagulation Profile: No results for input(s): INR, PROTIME in the last 168 hours. Cardiac Enzymes: No results for input(s): CKTOTAL, CKMB, CKMBINDEX, TROPONINI in the last 168 hours. BNP (last 3 results) No results for input(s): PROBNP in the last 8760 hours. HbA1C: Recent Labs    01/22/21 1753  HGBA1C 7.3*   CBG: Recent Labs  Lab 01/23/21 1151 01/23/21 1712 01/23/21 2056 01/24/21 0000 01/24/21 0603  GLUCAP 251* 203* 222* 234* 203*   Lipid Profile: No results for input(s): CHOL, HDL, LDLCALC, TRIG, CHOLHDL, LDLDIRECT in the last 72 hours. Thyroid Function Tests: No results for input(s): TSH, T4TOTAL, FREET4, T3FREE, THYROIDAB in the last 72 hours. Anemia Panel: No results for input(s): VITAMINB12, FOLATE, FERRITIN, TIBC, IRON, RETICCTPCT in the last 72 hours. Sepsis Labs:  Recent Labs  Lab 01/23/21 0059 01/23/21 1335 01/23/21 1606  LATICACIDVEN 4.2* 3.5* 2.8*    Recent Results (from the past 240 hour(s))  Resp Panel by RT-PCR (Flu A&B, Covid) Nasopharyngeal Swab     Status: None   Collection Time: 01/22/21  3:40 PM   Specimen: Nasopharyngeal Swab; Nasopharyngeal(NP) swabs in vial transport medium  Result Value Ref Range Status   SARS Coronavirus 2 by RT PCR NEGATIVE NEGATIVE Final    Comment: (NOTE) SARS-CoV-2 target nucleic acids are NOT DETECTED.  The SARS-CoV-2 RNA is generally detectable in upper respiratory specimens during the acute phase of infection. The lowest concentration of SARS-CoV-2 viral copies this assay can detect is 138 copies/mL. A negative result does not preclude SARS-Cov-2 infection and should not be used as the sole basis for treatment or other patient  management decisions. A negative result may occur with  improper specimen collection/handling, submission of specimen other than nasopharyngeal swab, presence of viral mutation(s) within the areas targeted by this assay, and inadequate number of viral copies(<138 copies/mL). A negative result must be combined with clinical observations, patient history, and epidemiological information. The expected result is Negative.  Fact Sheet for Patients:  EntrepreneurPulse.com.au  Fact Sheet for Healthcare Providers:  IncredibleEmployment.be  This test is no t yet approved or cleared by the [place] FDA and  has been authorized for detection and/or diagnosis of SARS-CoV-2 by FDA under an Emergency Use Authorization (EUA). This EUA will remain  in effect (meaning this test can be used) for the duration of the COVID-19 declaration under Section 564(b)(1) of the Act, 21 U.S.C.section 360bbb-3(b)(1), unless the authorization is terminated  or revoked sooner.       Influenza A by PCR NEGATIVE NEGATIVE Final   Influenza B by PCR NEGATIVE NEGATIVE Final    Comment: (NOTE) The Xpert Xpress SARS-CoV-2/FLU/RSV plus assay is intended as an aid in the diagnosis of influenza from Nasopharyngeal swab specimens and should not be used as a sole basis for treatment. Nasal washings and aspirates are unacceptable for Xpert Xpress SARS-CoV-2/FLU/RSV testing.  Fact Sheet for Patients: EntrepreneurPulse.com.au  Fact Sheet for Healthcare Providers: IncredibleEmployment.be  This test is not yet approved or cleared by the [place] FDA and has been authorized for detection and/or diagnosis of SARS-CoV-2 by FDA under an Emergency Use Authorization (EUA). This EUA will remain in effect (meaning this test can be used) for the duration of the COVID-19 declaration under Section 564(b)(1) of the Act, 21 U.S.C. section 360bbb-3(b)(1),  unless the authorization is terminated or revoked.  Performed at [facility], [address] [place], [place] [address]       Radiology Studies: DG Abdomen 1 View  Result Date: 01/22/2021 CLINICAL DATA:  Enteric catheter placement EXAM: ABDOMEN - 1 VIEW COMPARISON:  01/22/2021 FINDINGS: Frontal view of the lower chest and upper abdomen demonstrates enteric catheter passing below diaphragm tip and side port projecting over the gastric body. Lung bases are clear. Moderate stool within the colon. IMPRESSION: 1. Enteric catheter overlying gastric body. Electronically Signed   By: Randa Ngo M.D.   On: 01/22/2021 15:25   DG CHEST PORT 1 VIEW  Result Date: 01/23/2021 CLINICAL DATA:  Respiratory failure EXAM: PORTABLE CHEST 1 VIEW COMPARISON:  04/02/2020 FINDINGS: Low volume AP portable examination with perhaps minimal bibasilar atelectasis. Mild cardiomegaly. Esophagogastric tube with tip and side port below the diaphragm. IMPRESSION: 1. Low volume AP portable examination with perhaps minimal bibasilar atelectasis. 2. Mild cardiomegaly. Electronically  Signed   By: Eddie Candle M.D.   On: 01/23/2021 14:44   DG Abd Portable 1V  Result Date: 01/24/2021 CLINICAL DATA:  Abdominal pain and small-bowel obstruction. EXAM: PORTABLE ABDOMEN - 1 VIEW COMPARISON:  01/23/2021 and prior studies FINDINGS: NG tube again noted with tip overlying the mid stomach. Contrast and gas-filled small bowel loops are noted, none which appear distended now. No dilated bowel loops are present. IMPRESSION: Contrast and gas-filled small bowel loops without bowel distention. Electronically Signed   By: Margarette Canada M.D.   On: 01/24/2021 09:12   DG Abd Portable 1V-Small Bowel Obstruction Protocol-24 hr delay  Result Date: 01/23/2021 CLINICAL DATA:  Abdominal pain, small-bowel obstruction EXAM: PORTABLE ABDOMEN - 1 VIEW COMPARISON:  01/23/2021 at 6:43 a.m. FINDINGS: Supine frontal views of the abdomen and pelvis are  obtained. Enteric catheter tip and side port project over the gastric body. Moderate stool throughout the colon. Distended gas-filled loops of small bowel are seen within the central abdomen, measuring up to 3.4 cm in diameter. The previously administered oral contrast is too dilute to be visualized. No acute bony abnormalities. IMPRESSION: 1. Persisting gaseous distension of small bowel consistent with ileus or obstruction. The previously administered oral contrast is too dilute to be identified on this exam. 2. Moderate stool throughout the colon. Electronically Signed   By: Randa Ngo M.D.   On: 01/23/2021 23:29   DG Abd Portable 1V-Small Bowel Obstruction Protocol-initial, 8 hr delay  Result Date: 01/23/2021 CLINICAL DATA:  Recent small-bowel obstruction EXAM: PORTABLE ABDOMEN - 1 VIEW COMPARISON:  CT abdomen and pelvis January 22, 2021; abdominal radiograph January 22, 2021 FINDINGS: Nasogastric tube tip and side port in stomach. There is diffuse stool throughout the colon. There is oral contrast in the stomach; oral contrast seen beyond the stomach. There is intravenous contrast in the urinary bladder. There is currently no appreciable bowel dilatation or air-fluid level to suggest bowel obstruction. No free air. Scattered calcifications noted on the left which may have vascular etiology. Visualized lung bases clear. IMPRESSION: Nasogastric tube tip and side port in stomach. No contrast beyond the stomach. Question a degree of ileus. Diffuse stool throughout colon. No bowel dilatation or air-fluid level to suggest bowel obstruction by radiography. No free air evident. Electronically Signed   By: Lowella Grip III M.D.   On: 01/23/2021 08:24      Scheduled Meds: . enoxaparin (LOVENOX) injection  40 mg Subcutaneous Q24H  . insulin aspart  0-15 Units Subcutaneous Q6H  . insulin glargine  15 Units Subcutaneous QHS  . methylPREDNISolone (SOLU-MEDROL) injection  40 mg Intravenous Daily    Continuous Infusions: . sodium chloride 100 mL/hr at 01/24/21 0946  . piperacillin-tazobactam (ZOSYN)  IV 12.5 mL/hr at 01/24/21 0946     LOS: 2 days      Time spent: 20 minutes   Dessa Phi, DO [facility] 01/24/2021, 11:23 AM   Available via Epic secure chat 7am-7pm After these hours, please refer to coverage provider listed on [email or web address]

## 2021-01-24 NOTE — Progress Notes (Signed)
Subjective: Still with pain.  Objective: Vital signs in last 24 hours: Temp:  [98.1 F (36.7 C)-98.7 F (37.1 C)] 98.7 F (37.1 C) (03/26 0411) Pulse Rate:  [103-116] 109 (03/26 0411) Resp:  [17-20] 19 (03/26 0411) BP: (140-174)/(69-97) 163/82 (03/26 0411) SpO2:  [94 %-98 %] 97 % (03/26 0411) Last BM Date: 01/21/21 (PTA)  Intake/Output from previous day: 03/25 0701 - 03/26 0700 In: 0  Out: 550 [Urine:300; Emesis/NG output:250] Intake/Output this shift: No intake/output data recorded.  General appearance: moderate distress Resp: clear to auscultation bilaterally Cardio: regular rate and rhythm GI: Distended, hypoactive bowel sounds, tender with mild palpation Extremities: extremities normal, atraumatic, no cyanosis or edema  Lab Results: Recent Labs    01/22/21 0923 01/23/21 0059 01/24/21 0321  WBC 8.6 14.9* 7.1  HGB 14.3 17.7* 14.9  HCT 44.1 53.7* 45.2  PLT 232 311 252   BMET Recent Labs    01/22/21 0923 01/23/21 0059 01/24/21 0321  NA 137 137 140  K 3.9 4.1 3.8  CL 100 104 105  CO2 27 17* 23  GLUCOSE 225* 316* 224*  BUN 8 18 48*  CREATININE 0.60 1.03* 1.27*  CALCIUM 9.1 9.0 8.7*   LFT Recent Labs    01/22/21 0923  PROT 7.8  ALBUMIN 3.9  AST 22  ALT 16  ALKPHOS 55  BILITOT 0.4   PT/INR No results for input(s): LABPROT, INR in the last 72 hours. Hepatitis Panel No results for input(s): HEPBSAG, HCVAB, HEPAIGM, HEPBIGM in the last 72 hours. C-Diff No results for input(s): CDIFFTOX in the last 72 hours. Fecal Lactopherrin No results for input(s): FECLLACTOFRN in the last 72 hours.  Studies/Results: DG Abdomen 1 View  Result Date: 01/22/2021 CLINICAL DATA:  Enteric catheter placement EXAM: ABDOMEN - 1 VIEW COMPARISON:  01/22/2021 FINDINGS: Frontal view of the lower chest and upper abdomen demonstrates enteric catheter passing below diaphragm tip and side port projecting over the gastric body. Lung bases are clear. Moderate stool within the  colon. IMPRESSION: 1. Enteric catheter overlying gastric body. Electronically Signed   By: Randa Ngo M.D.   On: 01/22/2021 15:25   CT Abdomen Pelvis W Contrast  Result Date: 01/22/2021 CLINICAL DATA:  Nausea, vomiting, mid upper abdominal pain since last night, history hypertension, diabetes mellitus, GERD, hysterectomy, cholecystectomy, umbilical hernia repair EXAM: CT ABDOMEN AND PELVIS WITH CONTRAST TECHNIQUE: Multidetector CT imaging of the abdomen and pelvis was performed using the standard protocol following bolus administration of intravenous contrast. Sagittal and coronal MPR images reconstructed from axial data set. CONTRAST:  177mL OMNIPAQUE IOHEXOL 300 MG/ML SOLN IV. No oral contrast. COMPARISON:  04/02/2020 FINDINGS: Lower chest: Bibasilar atelectasis Hepatobiliary: Gallbladder surgically absent. Fatty infiltration of liver. No focal hepatic mass. Pancreas: Normal appearance Spleen: Normal appearance. Question tiny splenules versus normal sized lymph nodes Adrenals/Urinary Tract: Thickening of adrenal glands without discrete mass. Kidneys, ureters, and bladder normal appearance Stomach/Bowel: Base of appendix is minimally thickened but remainder of appendix is normal caliber without inflammatory changes. Dilated small loops distally, some containing fluid but most distal loops contain stool sign. Associated edema within some of the mesentery of the fluid filled small bowel loops. Dilated small bowel loops with stool extend to the ileocecal valve. Prominent stool is seen throughout the colon. Mild wall thickening of the cecum and ascending colon. Stomach and remaining bowel loops unremarkable. Vascular/Lymphatic: Vascular structures patent. Atherosclerotic calcifications at the origins of the renal arteries and at aortic bifurcation. Aorta normal caliber. Few scattered normal size mesenteric and  retroperitoneal lymph nodes without adenopathy. Reproductive: Uterus surgically absent.  Nonvisualization of ovaries. Other: Free fluid in pelvis, interloop, perihepatic, and minimally at RIGHT gutter. No free air. No hernia. Musculoskeletal: Osseous structures unremarkable. IMPRESSION: Partial small bowel obstruction, with dilated fluid-filled loops of small bowel as well as stool sign within distended distal small bowel loops extending to the ileocecal valve. Associated mesenteric edema within some of the fluid-filled small bowel loops in the RIGHT mid abdomen as well as mild wall thickening of the cecum. Thickening of base of appendix though the remainder of the appendix appears normal. Differential diagnosis would include stricture at the ileocecal valve with partial small bowel obstruction, inflammatory bowel disease involving the cecum and ileocecal valve with partial obstruction versus functional obstruction, or partial small bowel obstruction secondary to adhesions with associated edema and inflammation. Findings do not appear to represent appendicitis and there is no evidence of perforation or abscess. Electronically Signed   By: Lavonia Dana M.D.   On: 01/22/2021 11:23   DG CHEST PORT 1 VIEW  Result Date: 01/23/2021 CLINICAL DATA:  Respiratory failure EXAM: PORTABLE CHEST 1 VIEW COMPARISON:  04/02/2020 FINDINGS: Low volume AP portable examination with perhaps minimal bibasilar atelectasis. Mild cardiomegaly. Esophagogastric tube with tip and side port below the diaphragm. IMPRESSION: 1. Low volume AP portable examination with perhaps minimal bibasilar atelectasis. 2. Mild cardiomegaly. Electronically Signed   By: Eddie Candle M.D.   On: 01/23/2021 14:44   DG Abd Portable 1V-Small Bowel Obstruction Protocol-24 hr delay  Result Date: 01/23/2021 CLINICAL DATA:  Abdominal pain, small-bowel obstruction EXAM: PORTABLE ABDOMEN - 1 VIEW COMPARISON:  01/23/2021 at 6:43 a.m. FINDINGS: Supine frontal views of the abdomen and pelvis are obtained. Enteric catheter tip and side port project over the  gastric body. Moderate stool throughout the colon. Distended gas-filled loops of small bowel are seen within the central abdomen, measuring up to 3.4 cm in diameter. The previously administered oral contrast is too dilute to be visualized. No acute bony abnormalities. IMPRESSION: 1. Persisting gaseous distension of small bowel consistent with ileus or obstruction. The previously administered oral contrast is too dilute to be identified on this exam. 2. Moderate stool throughout the colon. Electronically Signed   By: Randa Ngo M.D.   On: 01/23/2021 23:29   DG Abd Portable 1V-Small Bowel Obstruction Protocol-initial, 8 hr delay  Result Date: 01/23/2021 CLINICAL DATA:  Recent small-bowel obstruction EXAM: PORTABLE ABDOMEN - 1 VIEW COMPARISON:  CT abdomen and pelvis January 22, 2021; abdominal radiograph January 22, 2021 FINDINGS: Nasogastric tube tip and side port in stomach. There is diffuse stool throughout the colon. There is oral contrast in the stomach; oral contrast seen beyond the stomach. There is intravenous contrast in the urinary bladder. There is currently no appreciable bowel dilatation or air-fluid level to suggest bowel obstruction. No free air. Scattered calcifications noted on the left which may have vascular etiology. Visualized lung bases clear. IMPRESSION: Nasogastric tube tip and side port in stomach. No contrast beyond the stomach. Question a degree of ileus. Diffuse stool throughout colon. No bowel dilatation or air-fluid level to suggest bowel obstruction by radiography. No free air evident. Electronically Signed   By: Lowella Grip III M.D.   On: 01/23/2021 08:24    Medications:  Scheduled: . enoxaparin (LOVENOX) injection  40 mg Subcutaneous Q24H  . insulin aspart  0-15 Units Subcutaneous Q6H  . insulin glargine  15 Units Subcutaneous QHS  . methylPREDNISolone (SOLU-MEDROL) injection  40 mg Intravenous Daily  Continuous: . sodium chloride 100 mL/hr at 01/23/21 1509  .  piperacillin-tazobactam (ZOSYN)  IV 3.375 g (01/24/21 0615)    Assessment/Plan: 1) SBO. 2) Abdominal pain.   At the time of the examination, a couple of hours ago, she was unchanged from yesterday's examination.  She was tender to mild palpation and very uncomfortable.  Dr. Hassell Done noted that she had a spontaneous bowel movement this AM.  Her KUB is pending.  The hope is that conservative management will resolve the issue.  Plan: 1) Continue with Solumedrol. 2) Continue with NG tube.  LOS: 2 days   Hawkin Charo D 01/24/2021, 8:58 AM

## 2021-01-25 LAB — CBC
HCT: 46.9 % — ABNORMAL HIGH (ref 36.0–46.0)
Hemoglobin: 14.9 g/dL (ref 12.0–15.0)
MCH: 29.4 pg (ref 26.0–34.0)
MCHC: 31.8 g/dL (ref 30.0–36.0)
MCV: 92.5 fL (ref 80.0–100.0)
Platelets: 156 10*3/uL (ref 150–400)
RBC: 5.07 MIL/uL (ref 3.87–5.11)
RDW: 15 % (ref 11.5–15.5)
WBC: 6.4 10*3/uL (ref 4.0–10.5)
nRBC: 0 % (ref 0.0–0.2)

## 2021-01-25 LAB — GLUCOSE, CAPILLARY
Glucose-Capillary: 107 mg/dL — ABNORMAL HIGH (ref 70–99)
Glucose-Capillary: 201 mg/dL — ABNORMAL HIGH (ref 70–99)
Glucose-Capillary: 193 mg/dL — ABNORMAL HIGH (ref 70–99)

## 2021-01-25 LAB — URINE CULTURE: Culture: NO GROWTH

## 2021-01-25 LAB — BASIC METABOLIC PANEL
Anion gap: 8 (ref 5–15)
BUN: 26 mg/dL — ABNORMAL HIGH (ref 8–23)
CO2: 25 mmol/L (ref 22–32)
Calcium: 8.5 mg/dL — ABNORMAL LOW (ref 8.9–10.3)
Chloride: 114 mmol/L — ABNORMAL HIGH (ref 98–111)
Creatinine, Ser: 0.68 mg/dL (ref 0.44–1.00)
GFR, Estimated: >60 mL/min (ref >60)
Glucose, Bld: 133 mg/dL — ABNORMAL HIGH (ref 70–99)
Potassium: 4 mmol/L (ref 3.5–5.1)
Sodium: 147 mmol/L — ABNORMAL HIGH (ref 135–145)

## 2021-01-25 MED ORDER — DEXTROSE-NACL 5-0.45 % IV SOLN: INTRAVENOUS | Status: DC

## 2021-01-25 NOTE — Progress Notes (Signed)
PROGRESS NOTE    Diane [name]  NFA:[id number] DOB: 1946/02/13 DOA: 01/22/2021 PCP: [name], MD     Brief Narrative:  Diane [name] is a 75 y.o. female with medical history significant of HTN, IDDM, rheumatoid arthritis, anxiety, depression, mild cognitive deficit who presented with new onset of abdominal pain, nausea, vomiting. Her symptoms started last night after dinner.  Abdominal pain vaguely localized on the right aspect near umbilicus, pain has been cramping intermittent but becomes more constant this morning. Abdominal x-ray showed small bowel perforation, CT abdomen showed dilated bowels, suspicious for ileus cecal wall stricture.  General surgery as well as GI were consulted.  New events last 24 hours / Subjective: Patient much more alert and conversational today.  She states that she feels better, but continues to have abdominal pain with palpation.  Denies any nausea.  Assessment & Plan:   Principal Problem:   SBO (small bowel obstruction) (HCC) Active Problems:   Prolonged Q-T interval on ECG   Type 2 diabetes mellitus with diabetic neuropathy, unspecified (HCC)   Peripheral neuropathy   AKI (acute kidney injury) ([place])   Rheumatoid arthritis (HCC)   pSBO -CT A/P: Partial small bowel obstruction, with dilated fluid-filled loops of small bowel as well as stool sign within distended distal small bowel loops extending to the ileocecal valve. Associated mesenteric edema within some of the fluid-filled small bowel loops in the RIGHT mid abdomen as well as mild wall thickening of the cecum. Thickening of base of appendix though the remainder of the appendix appears normal. Findings do not appear to represent appendicitis and there is no evidence of perforation or abscess. -Continue NGT, n.p.o., IV fluid -General surgery, GI following -Trial of Solu-Medrol due to possibility of inflammatory component  Severe sepsis, not present on admission, question abdominal  source -Patient was afebrile, HR 81, RR 18, WBC 8.6 at the time of admission.  This morning, patient remains tachycardic with leukocytosis 14.9 and lactic acidosis 4.2 with AKI -Covid, influenza negative -UA negative, urine culture negative -Chest x-ray unremarkable -Blood cultures negative to date -Continue empiric IV Zosyn to cover intra-abdominal source -Leukocytosis now resolved, lactic acid decreased  Hypernatremia -Change normal saline to D5 half-normal saline  Acute hypoxemic respiratory failure -Patient with SPO2 89% on room air, currently on 2 L nasal cannula O2, wean as able -Chest x-ray unremarkable  AKI -Presented with creatinine 0.6, up to 1.27  -Renal ultrasound unremarkable -Resolved  Diabetes mellitus, insulin-dependent, poorly controlled with hyperglycemia -Hemoglobin A1c 7.3 -Hold home Metformin, Neurontin -Continue Lantus, continue sliding scale insulin  Hypertension -Hold home medication including Norvasc, telmisartan while n.p.o.  Rheumatoid arthritis -Hold home Plaquenil, Norco, methotrexate  Anxiety/depression -Hold home Cymbalta  Mild cognitive deficit -Per daughter, patient has had some memory loss over the years, possibly early beginning of dementia.  Patient does live at home alone and is independent of ADLs at baseline  Prolonged QTc -Repeat EKG reviewed independently reveals QTC 500   DVT prophylaxis:  enoxaparin (LOVENOX) injection 40 mg Start: 01/23/21 1000  Code Status: Full code Family Communication: No family at bedside, updated daughter yesterday over the phone Disposition Plan:  Status is: Inpatient  Remains inpatient appropriate because:IV treatments appropriate due to intensity of illness or inability to take PO and Inpatient level of care appropriate due to severity of illness   Dispo: The patient is from: Home              Anticipated d/c is to: Home  Patient currently is not medically stable to d/c.  Continue  treatment for SBO, continue IV fluid and IV Solu-Medrol   Difficult to place patient No      Consultants:  General surgery GI  Antimicrobials:  Anti-infectives (From admission, onward)   Start     Dose/Rate Route Frequency Ordered Stop   01/23/21 1400  piperacillin-tazobactam (ZOSYN) IVPB 3.375 g        3.375 g 12.5 mL/hr over 240 Minutes Intravenous Every 8 hours 01/23/21 1244         Objective: Vitals:   01/24/21 1346 01/24/21 2017 01/24/21 2300 01/25/21 0450  BP: (!) 166/84 (!) 174/86 (!) 150/78 (!) 147/83  Pulse: 96 98 92 83  Resp: 20 17  18   Temp: 97.9 F (36.6 C) 98.6 F (37 C)  98 F (36.7 C)  TempSrc:  Axillary  Axillary  SpO2: 100% 99%  100%  Weight:      Height:        Intake/Output Summary (Last 24 hours) at 01/25/2021 1020 Last data filed at 01/25/2021 0753 Gross per 24 hour  Intake 2009.46 ml  Output 1950 ml  Net 59.46 ml   Filed Weights   01/22/21 0847  Weight: 98.4 kg    Examination: General exam: Appears calm and comfortable  Respiratory system: Clear to auscultation. Respiratory effort normal. Cardiovascular system: S1 & S2 heard, RRR. No pedal edema. Gastrointestinal system: Abdomen is nondistended, soft and tender to palpation, positive bowel sounds, NG tube in place Central nervous system: Alert and oriented. Non focal exam. Speech clear  Extremities: Symmetric in appearance bilaterally  Skin: No rashes, lesions or ulcers on exposed skin  Psychiatry: Judgement and insight appear stable. Mood & affect appropriate.    Data Reviewed: I have personally reviewed following labs and imaging studies  CBC: Recent Labs  Lab 01/22/21 0923 01/23/21 0059 01/24/21 0321 01/25/21 0153  WBC 8.6 14.9* 7.1 6.4  NEUTROABS 7.2  --   --   --   HGB 14.3 17.7* 14.9 14.9  HCT 44.1 53.7* 45.2 46.9*  MCV 90.6 88.6 88.3 92.5  PLT 232 311 252 891   Basic Metabolic Panel: Recent Labs  Lab 01/22/21 0923 01/23/21 0059 01/24/21 0321 01/25/21 0153   NA 137 137 140 147*  K 3.9 4.1 3.8 4.0  CL 100 104 105 114*  CO2 27 17* 23 25  GLUCOSE 225* 316* 224* 133*  BUN 8 18 48* 26*  CREATININE 0.60 1.03* 1.27* 0.68  CALCIUM 9.1 9.0 8.7* 8.5*   GFR: Estimated Creatinine Clearance: 75.7 mL/min (by C-G formula based on SCr of 0.68 mg/dL). Liver Function Tests: Recent Labs  Lab 01/22/21 0923  AST 22  ALT 16  ALKPHOS 55  BILITOT 0.4  PROT 7.8  ALBUMIN 3.9   Recent Labs  Lab 01/22/21 0923  LIPASE 42   No results for input(s): AMMONIA in the last 168 hours. Coagulation Profile: No results for input(s): INR, PROTIME in the last 168 hours. Cardiac Enzymes: No results for input(s): CKTOTAL, CKMB, CKMBINDEX, TROPONINI in the last 168 hours. BNP (last 3 results) No results for input(s): PROBNP in the last 8760 hours. HbA1C: Recent Labs    01/22/21 1753  HGBA1C 7.3*   CBG: Recent Labs  Lab 01/24/21 0603 01/24/21 1140 01/24/21 1742 01/24/21 2320 01/25/21 0524  GLUCAP 203* 158* 188* 119* 107*   Lipid Profile: No results for input(s): CHOL, HDL, LDLCALC, TRIG, CHOLHDL, LDLDIRECT in the last 72 hours. Thyroid Function Tests: No results  for input(s): TSH, T4TOTAL, FREET4, T3FREE, THYROIDAB in the last 72 hours. Anemia Panel: No results for input(s): VITAMINB12, FOLATE, FERRITIN, TIBC, IRON, RETICCTPCT in the last 72 hours. Sepsis Labs: Recent Labs  Lab 01/23/21 0059 01/23/21 1335 01/23/21 1606  LATICACIDVEN 4.2* 3.5* 2.8*    Recent Results (from the past 240 hour(s))  Resp Panel by RT-PCR (Flu A&B, Covid) Nasopharyngeal Swab     Status: None   Collection Time: 01/22/21  3:40 PM   Specimen: Nasopharyngeal Swab; Nasopharyngeal(NP) swabs in vial transport medium  Result Value Ref Range Status   SARS Coronavirus 2 by RT PCR NEGATIVE NEGATIVE Final    Comment: (NOTE) SARS-CoV-2 target nucleic acids are NOT DETECTED.  The SARS-CoV-2 RNA is generally detectable in upper respiratory specimens during the acute phase of  infection. The lowest concentration of SARS-CoV-2 viral copies this assay can detect is 138 copies/mL. A negative result does not preclude SARS-Cov-2 infection and should not be used as the sole basis for treatment or other patient management decisions. A negative result may occur with  improper specimen collection/handling, submission of specimen other than nasopharyngeal swab, presence of viral mutation(s) within the areas targeted by this assay, and inadequate number of viral copies(<138 copies/mL). A negative result must be combined with clinical observations, patient history, and epidemiological information. The expected result is Negative.  Fact Sheet for Patients:  EntrepreneurPulse.com.au  Fact Sheet for Healthcare Providers:  IncredibleEmployment.be  This test is no t yet approved or cleared by the [place] FDA and  has been authorized for detection and/or diagnosis of SARS-CoV-2 by FDA under an Emergency Use Authorization (EUA). This EUA will remain  in effect (meaning this test can be used) for the duration of the COVID-19 declaration under Section 564(b)(1) of the Act, 21 U.S.C.section 360bbb-3(b)(1), unless the authorization is terminated  or revoked sooner.       Influenza A by PCR NEGATIVE NEGATIVE Final   Influenza B by PCR NEGATIVE NEGATIVE Final    Comment: (NOTE) The Xpert Xpress SARS-CoV-2/FLU/RSV plus assay is intended as an aid in the diagnosis of influenza from Nasopharyngeal swab specimens and should not be used as a sole basis for treatment. Nasal washings and aspirates are unacceptable for Xpert Xpress SARS-CoV-2/FLU/RSV testing.  Fact Sheet for Patients: EntrepreneurPulse.com.au  Fact Sheet for Healthcare Providers: IncredibleEmployment.be  This test is not yet approved or cleared by the [place] FDA and has been authorized for detection and/or diagnosis of SARS-CoV-2  by FDA under an Emergency Use Authorization (EUA). This EUA will remain in effect (meaning this test can be used) for the duration of the COVID-19 declaration under Section 564(b)(1) of the Act, 21 U.S.C. section 360bbb-3(b)(1), unless the authorization is terminated or revoked.  Performed at [facility], [address] [place], [place] [address]   Culture, blood (routine x 2)     Status: None (Preliminary result)   Collection Time: 01/23/21  1:20 PM   Specimen: BLOOD  Result Value Ref Range Status   Specimen Description BLOOD RIGHT ANTECUBITAL  Final   Special Requests   Final    BOTTLES DRAWN AEROBIC ONLY Blood Culture adequate volume   Culture   Final    NO GROWTH 1 DAY Performed at [facility], [place] [address], [place], [place] [address]    Report Status PENDING  Incomplete  Culture, blood (routine x 2)     Status: None (Preliminary result)   Collection Time: 01/23/21  1:30 PM  Specimen: BLOOD RIGHT HAND  Result Value Ref Range Status   Specimen Description BLOOD RIGHT HAND  Final   Special Requests   Final    BOTTLES DRAWN AEROBIC ONLY Blood Culture adequate volume   Culture   Final    NO GROWTH 1 DAY Performed at [facility], [address], [place], [place] [address]    Report Status PENDING  Incomplete  Culture, Urine     Status: None   Collection Time: 01/24/21  8:05 AM   Specimen: Urine, Random  Result Value Ref Range Status   Specimen Description URINE, RANDOM  Final   Special Requests NONE  Final   Culture   Final    NO GROWTH Performed at [facility], [place] [address], [place], [place] [address]    Report Status 01/25/2021 FINAL  Final      Radiology Studies: US RENAL  Result Date: 01/24/2021 CLINICAL DATA:  75 year old female with acute kidney injury. EXAM: RENAL / URINARY TRACT ULTRASOUND COMPLETE COMPARISON:  None. FINDINGS: Right Kidney: Renal measurements: 10.9 x 5.2 x 5.5 cm = volume: 163 mL. Echogenicity within  normal limits. No mass or hydronephrosis visualized. Left Kidney: Renal measurements: 11.8 x 6.4 x 6.2 cm = volume: 244 mL. Echogenicity within normal limits. No mass or hydronephrosis visualized. Pseudo mass in the UPPER LEFT kidney noted. Bladder: Appears normal for degree of bladder distention. Other: None. IMPRESSION: Normal renal ultrasound. Electronically Signed   By: Margarette Canada M.D.   On: 01/24/2021 11:26   DG CHEST PORT 1 VIEW  Result Date: 01/23/2021 CLINICAL DATA:  Respiratory failure EXAM: PORTABLE CHEST 1 VIEW COMPARISON:  04/02/2020 FINDINGS: Low volume AP portable examination with perhaps minimal bibasilar atelectasis. Mild cardiomegaly. Esophagogastric tube with tip and side port below the diaphragm. IMPRESSION: 1. Low volume AP portable examination with perhaps minimal bibasilar atelectasis. 2. Mild cardiomegaly. Electronically Signed   By: Eddie Candle M.D.   On: 01/23/2021 14:44   DG Abd Portable 1V  Result Date: 01/24/2021 CLINICAL DATA:  Abdominal pain and small-bowel obstruction. EXAM: PORTABLE ABDOMEN - 1 VIEW COMPARISON:  01/23/2021 and prior studies FINDINGS: NG tube again noted with tip overlying the mid stomach. Contrast and gas-filled small bowel loops are noted, none which appear distended now. No dilated bowel loops are present. IMPRESSION: Contrast and gas-filled small bowel loops without bowel distention. Electronically Signed   By: Margarette Canada M.D.   On: 01/24/2021 09:12   DG Abd Portable 1V-Small Bowel Obstruction Protocol-24 hr delay  Result Date: 01/23/2021 CLINICAL DATA:  Abdominal pain, small-bowel obstruction EXAM: PORTABLE ABDOMEN - 1 VIEW COMPARISON:  01/23/2021 at 6:43 a.m. FINDINGS: Supine frontal views of the abdomen and pelvis are obtained. Enteric catheter tip and side port project over the gastric body. Moderate stool throughout the colon. Distended gas-filled loops of small bowel are seen within the central abdomen, measuring up to 3.4 cm in diameter. The  previously administered oral contrast is too dilute to be visualized. No acute bony abnormalities. IMPRESSION: 1. Persisting gaseous distension of small bowel consistent with ileus or obstruction. The previously administered oral contrast is too dilute to be identified on this exam. 2. Moderate stool throughout the colon. Electronically Signed   By: Randa Ngo M.D.   On: 01/23/2021 23:29      Scheduled Meds: . enoxaparin (LOVENOX) injection  40 mg Subcutaneous Q24H  . insulin aspart  0-15 Units Subcutaneous Q6H  . insulin glargine  15 Units Subcutaneous QHS  .  methylPREDNISolone (SOLU-MEDROL) injection  40 mg Intravenous Daily   Continuous Infusions: . dextrose 5 % and 0.45% NaCl 100 mL/hr at 01/25/21 0922  . piperacillin-tazobactam (ZOSYN)  IV 3.375 g (01/25/21 0520)     LOS: 3 days      Time spent: 20 minutes   Dessa Phi, DO [facility] 01/25/2021, 10:20 AM   Available via Epic secure chat 7am-7pm After these hours, please refer to coverage provider listed on [email or web address]

## 2021-01-25 NOTE — Progress Notes (Signed)
Subjective: Still with pain, but she reports feeling better.  Passing flatus and one bowel movement yesterday.  Objective: Vital signs in last 24 hours: Temp:  [97.9 F (36.6 C)-98.6 F (37 C)] 98 F (36.7 C) (03/27 0450) Pulse Rate:  [83-98] 83 (03/27 0450) Resp:  [17-20] 18 (03/27 0450) BP: (147-174)/(78-86) 147/83 (03/27 0450) SpO2:  [99 %-100 %] 100 % (03/27 0450) Last BM Date: 01/24/21  Intake/Output from previous day: 03/26 0701 - 03/27 0700 In: 2009.5 [P.O.:50; I.V.:1909.5; IV Piggyback:50] Out: 1884 [Urine:1000; Emesis/NG output:850] Intake/Output this shift: Total I/O In: 0  Out: 400 [Urine:400]  General appearance: uncomfortable, but not in severe distress as compared to the prior two days. Resp: clear to auscultation bilaterally Cardio: regular rate and rhythm GI: soft, tender to palpation with mild touch, no significant tympany Extremities: extremities normal, atraumatic, no cyanosis or edema  Lab Results: Recent Labs    01/23/21 0059 01/24/21 0321 01/25/21 0153  WBC 14.9* 7.1 6.4  HGB 17.7* 14.9 14.9  HCT 53.7* 45.2 46.9*  PLT 311 252 156   BMET Recent Labs    01/23/21 0059 01/24/21 0321 01/25/21 0153  NA 137 140 147*  K 4.1 3.8 4.0  CL 104 105 114*  CO2 17* 23 25  GLUCOSE 316* 224* 133*  BUN 18 48* 26*  CREATININE 1.03* 1.27* 0.68  CALCIUM 9.0 8.7* 8.5*   LFT Recent Labs    01/22/21 0923  PROT 7.8  ALBUMIN 3.9  AST 22  ALT 16  ALKPHOS 55  BILITOT 0.4   PT/INR No results for input(s): LABPROT, INR in the last 72 hours. Hepatitis Panel No results for input(s): HEPBSAG, HCVAB, HEPAIGM, HEPBIGM in the last 72 hours. C-Diff No results for input(s): CDIFFTOX in the last 72 hours. Fecal Lactopherrin No results for input(s): FECLLACTOFRN in the last 72 hours.  Studies/Results: US RENAL  Result Date: 01/24/2021 CLINICAL DATA:  75 year old female with acute kidney injury. EXAM: RENAL / URINARY TRACT ULTRASOUND COMPLETE COMPARISON:   None. FINDINGS: Right Kidney: Renal measurements: 10.9 x 5.2 x 5.5 cm = volume: 163 mL. Echogenicity within normal limits. No mass or hydronephrosis visualized. Left Kidney: Renal measurements: 11.8 x 6.4 x 6.2 cm = volume: 244 mL. Echogenicity within normal limits. No mass or hydronephrosis visualized. Pseudo mass in the UPPER LEFT kidney noted. Bladder: Appears normal for degree of bladder distention. Other: None. IMPRESSION: Normal renal ultrasound. Electronically Signed   By: Margarette Canada M.D.   On: 01/24/2021 11:26   DG CHEST PORT 1 VIEW  Result Date: 01/23/2021 CLINICAL DATA:  Respiratory failure EXAM: PORTABLE CHEST 1 VIEW COMPARISON:  04/02/2020 FINDINGS: Low volume AP portable examination with perhaps minimal bibasilar atelectasis. Mild cardiomegaly. Esophagogastric tube with tip and side port below the diaphragm. IMPRESSION: 1. Low volume AP portable examination with perhaps minimal bibasilar atelectasis. 2. Mild cardiomegaly. Electronically Signed   By: Eddie Candle M.D.   On: 01/23/2021 14:44   DG Abd Portable 1V  Result Date: 01/24/2021 CLINICAL DATA:  Abdominal pain and small-bowel obstruction. EXAM: PORTABLE ABDOMEN - 1 VIEW COMPARISON:  01/23/2021 and prior studies FINDINGS: NG tube again noted with tip overlying the mid stomach. Contrast and gas-filled small bowel loops are noted, none which appear distended now. No dilated bowel loops are present. IMPRESSION: Contrast and gas-filled small bowel loops without bowel distention. Electronically Signed   By: Margarette Canada M.D.   On: 01/24/2021 09:12   DG Abd Portable 1V-Small Bowel Obstruction Protocol-24 hr delay  Result  Date: 01/23/2021 CLINICAL DATA:  Abdominal pain, small-bowel obstruction EXAM: PORTABLE ABDOMEN - 1 VIEW COMPARISON:  01/23/2021 at 6:43 a.m. FINDINGS: Supine frontal views of the abdomen and pelvis are obtained. Enteric catheter tip and side port project over the gastric body. Moderate stool throughout the colon. Distended  gas-filled loops of small bowel are seen within the central abdomen, measuring up to 3.4 cm in diameter. The previously administered oral contrast is too dilute to be visualized. No acute bony abnormalities. IMPRESSION: 1. Persisting gaseous distension of small bowel consistent with ileus or obstruction. The previously administered oral contrast is too dilute to be identified on this exam. 2. Moderate stool throughout the colon. Electronically Signed   By: Randa Ngo M.D.   On: 01/23/2021 23:29    Medications:  Scheduled: . enoxaparin (LOVENOX) injection  40 mg Subcutaneous Q24H  . insulin aspart  0-15 Units Subcutaneous Q6H  . insulin glargine  15 Units Subcutaneous QHS  . methylPREDNISolone (SOLU-MEDROL) injection  40 mg Intravenous Daily   Continuous: . dextrose 5 % and 0.45% NaCl    . piperacillin-tazobactam (ZOSYN)  IV 3.375 g (01/25/21 0520)    Assessment/Plan: 1) SBO - improving. 2) Abdominal pain - improving.   Clinically, the patient is headed in the right direction.  It is not clear if the NG tube or the Solumedrol is driving the improvement.  The presumption is that it is a combination of the two.  She had a bowel movement yesterday and she is reporting flatus.  Yesterday's KUB did not show any distension of the small bowel.  Plan: 1) Continue with the NG tube and Solumedrol.  LOS: 3 days   HUNG,PATRICK D 01/25/2021, 9:05 AM

## 2021-01-25 NOTE — Progress Notes (Signed)
Patient ID: Diane [name], female   DOB: 1946/08/26, 75 y.o.   MRN: [id number] [facility] Surgery Progress Note:   * No surgery found *  Subjective: Mental status is more alert today.  Complaints abdomen less sore but some soreness noted on the right. Objective: Vital signs in last 24 hours: Temp:  [97.9 F (36.6 C)-98.6 F (37 C)] 98 F (36.7 C) (03/27 0450) Pulse Rate:  [83-98] 83 (03/27 0450) Resp:  [17-20] 18 (03/27 0450) BP: (147-174)/(78-86) 147/83 (03/27 0450) SpO2:  [99 %-100 %] 100 % (03/27 0450)  Intake/Output from previous day: 03/26 0701 - 03/27 0700 In: 2009.5 [P.O.:50; I.V.:1909.5; IV Piggyback:50] Out: 4098 [Urine:1000; Emesis/NG output:850] Intake/Output this shift: Total I/O In: 0  Out: 400 [Urine:400]  Physical Exam: Work of breathing is not labored in the bed.  Abdomen is nondistended.  Some pain to touch on the right side.    Lab Results:  Results for orders placed or performed during the hospital encounter of 01/22/21 (from the past 48 hour(s))  Glucose, capillary     Status: Abnormal   Collection Time: 01/23/21 11:51 AM  Result Value Ref Range   Glucose-Capillary 251 (H) 70 - 99 mg/dL    Comment: Glucose reference range applies only to samples taken after fasting for at least 8 hours.  Culture, blood (routine x 2)     Status: None (Preliminary result)   Collection Time: 01/23/21  1:20 PM   Specimen: BLOOD  Result Value Ref Range   Specimen Description BLOOD RIGHT ANTECUBITAL    Special Requests      BOTTLES DRAWN AEROBIC ONLY Blood Culture adequate volume   Culture      NO GROWTH 1 DAY Performed at [facility], [place] [address] [place], [place] [address]    Report Status PENDING   Culture, blood (routine x 2)     Status: None (Preliminary result)   Collection Time: 01/23/21  1:30 PM   Specimen: BLOOD RIGHT HAND  Result Value Ref Range   Specimen Description BLOOD RIGHT HAND    Special Requests      BOTTLES DRAWN AEROBIC ONLY Blood  Culture adequate volume   Culture      NO GROWTH 1 DAY Performed at [facility], [place] [address], [place], [place] [address]    Report Status PENDING   Lactic acid, plasma     Status: Abnormal   Collection Time: 01/23/21  1:35 PM  Result Value Ref Range   Lactic Acid, Venous 3.5 (HH) 0.5 - 1.9 mmol/L    Comment: CRITICAL RESULT CALLED TO, READ BACK BY AND VERIFIED WITH: [facility] RN @ 1430 01/23/21 LEONARD,A Performed at [facility], [place] [address], [place], [place] [address]   Lactic acid, plasma     Status: Abnormal   Collection Time: 01/23/21  4:06 PM  Result Value Ref Range   Lactic Acid, Venous 2.8 (HH) 0.5 - 1.9 mmol/L    Comment: CRITICAL VALUE NOTED.  VALUE IS CONSISTENT WITH PREVIOUSLY REPORTED AND CALLED VALUE. Performed at [facility], [place] [address] [place], [place] [address]   Glucose, capillary     Status: Abnormal   Collection Time: 01/23/21  5:12 PM  Result Value Ref Range   Glucose-Capillary 203 (H) 70 - 99 mg/dL    Comment: Glucose reference range applies only to samples taken after fasting for at least 8 hours.  Glucose, capillary     Status: Abnormal   Collection  Time: 01/23/21  8:56 PM  Result Value Ref Range   Glucose-Capillary 222 (H) 70 - 99 mg/dL    Comment: Glucose reference range applies only to samples taken after fasting for at least 8 hours.  Glucose, capillary     Status: Abnormal   Collection Time: 01/24/21 12:00 AM  Result Value Ref Range   Glucose-Capillary 234 (H) 70 - 99 mg/dL    Comment: Glucose reference range applies only to samples taken after fasting for at least 8 hours.  CBC     Status: Abnormal   Collection Time: 01/24/21  3:21 AM  Result Value Ref Range   WBC 7.1 4.0 - 10.5 K/uL   RBC 5.12 (H) 3.87 - 5.11 MIL/uL   Hemoglobin 14.9 12.0 - 15.0 g/dL   HCT 45.2 36.0 - 46.0 %   MCV 88.3 80.0 - 100.0 fL   MCH 29.1 26.0 - 34.0 pg   MCHC 33.0 30.0 - 36.0 g/dL   RDW 14.8 11.5 - 15.5 %   Platelets 252 150 - 400 K/uL    nRBC 0.0 0.0 - 0.2 %    Comment: Performed at [facility], [place] [address] [place], [place] [address]  Basic metabolic panel     Status: Abnormal   Collection Time: 01/24/21  3:21 AM  Result Value Ref Range   Sodium 140 135 - 145 mmol/L   Potassium 3.8 3.5 - 5.1 mmol/L   Chloride 105 98 - 111 mmol/L   CO2 23 22 - 32 mmol/L   Glucose, Bld 224 (H) 70 - 99 mg/dL    Comment: Glucose reference range applies only to samples taken after fasting for at least 8 hours.   BUN 48 (H) 8 - 23 mg/dL   Creatinine, Ser 1.27 (H) 0.44 - 1.00 mg/dL   Calcium 8.7 (L) 8.9 - 10.3 mg/dL   GFR, Estimated 44 (L) >60 mL/min    Comment: (NOTE) Calculated using the CKD-EPI Creatinine Equation (2021)    Anion gap 12 5 - 15    Comment: Performed at [facility] [place] [address] [place], [place] [address]  Glucose, capillary     Status: Abnormal   Collection Time: 01/24/21  6:03 AM  Result Value Ref Range   Glucose-Capillary 203 (H) 70 - 99 mg/dL    Comment: Glucose reference range applies only to samples taken after fasting for at least 8 hours.  Urinalysis, Routine w reflex microscopic Urine, Clean Catch     Status: None   Collection Time: 01/24/21  8:05 AM  Result Value Ref Range   Color, Urine YELLOW YELLOW   APPearance CLEAR CLEAR   Specific Gravity, Urine 1.024 1.005 - 1.030   pH 5.0 5.0 - 8.0   Glucose, UA NEGATIVE NEGATIVE mg/dL   Hgb urine dipstick NEGATIVE NEGATIVE   Bilirubin Urine NEGATIVE NEGATIVE   Ketones, ur NEGATIVE NEGATIVE mg/dL   Protein, ur NEGATIVE NEGATIVE mg/dL   Nitrite NEGATIVE NEGATIVE   Leukocytes,Ua NEGATIVE NEGATIVE    Comment: Performed at [facility] [address], [place], [place] [address]  Glucose, capillary     Status: Abnormal   Collection Time: 01/24/21 11:40 AM  Result Value Ref Range   Glucose-Capillary 158 (H) 70 - 99 mg/dL    Comment: Glucose reference range applies only to samples taken after fasting for at least 8 hours.  Glucose,  capillary     Status: Abnormal   Collection Time: 01/24/21  5:42 PM  Result  Value Ref Range   Glucose-Capillary 188 (H) 70 - 99 mg/dL    Comment: Glucose reference range applies only to samples taken after fasting for at least 8 hours.  Glucose, capillary     Status: Abnormal   Collection Time: 01/24/21 11:20 PM  Result Value Ref Range   Glucose-Capillary 119 (H) 70 - 99 mg/dL    Comment: Glucose reference range applies only to samples taken after fasting for at least 8 hours.  CBC     Status: Abnormal   Collection Time: 01/25/21  1:53 AM  Result Value Ref Range   WBC 6.4 4.0 - 10.5 K/uL   RBC 5.07 3.87 - 5.11 MIL/uL   Hemoglobin 14.9 12.0 - 15.0 g/dL   HCT 46.9 (H) 36.0 - 46.0 %   MCV 92.5 80.0 - 100.0 fL   MCH 29.4 26.0 - 34.0 pg   MCHC 31.8 30.0 - 36.0 g/dL   RDW 15.0 11.5 - 15.5 %   Platelets 156 150 - 400 K/uL   nRBC 0.0 0.0 - 0.2 %    Comment: Performed at [facility], [place] [address] [place], [place] [address]  Basic metabolic panel     Status: Abnormal   Collection Time: 01/25/21  1:53 AM  Result Value Ref Range   Sodium 147 (H) 135 - 145 mmol/L   Potassium 4.0 3.5 - 5.1 mmol/L   Chloride 114 (H) 98 - 111 mmol/L   CO2 25 22 - 32 mmol/L   Glucose, Bld 133 (H) 70 - 99 mg/dL    Comment: Glucose reference range applies only to samples taken after fasting for at least 8 hours.   BUN 26 (H) 8 - 23 mg/dL   Creatinine, Ser 0.68 0.44 - 1.00 mg/dL   Calcium 8.5 (L) 8.9 - 10.3 mg/dL   GFR, Estimated >60 >60 mL/min    Comment: (NOTE) Calculated using the CKD-EPI Creatinine Equation (2021)    Anion gap 8 5 - 15    Comment: Performed at [facility] [address], [place], [place] [address]  Glucose, capillary     Status: Abnormal   Collection Time: 01/25/21  5:24 AM  Result Value Ref Range   Glucose-Capillary 107 (H) 70 - 99 mg/dL    Comment: Glucose reference range applies only to samples taken after fasting for at least 8 hours.    Radiology/Results: US  RENAL  Result Date: 01/24/2021 CLINICAL DATA:  74 year old female with acute kidney injury. EXAM: RENAL / URINARY TRACT ULTRASOUND COMPLETE COMPARISON:  None. FINDINGS: Right Kidney: Renal measurements: 10.9 x 5.2 x 5.5 cm = volume: 163 mL. Echogenicity within normal limits. No mass or hydronephrosis visualized. Left Kidney: Renal measurements: 11.8 x 6.4 x 6.2 cm = volume: 244 mL. Echogenicity within normal limits. No mass or hydronephrosis visualized. Pseudo mass in the UPPER LEFT kidney noted. Bladder: Appears normal for degree of bladder distention. Other: None. IMPRESSION: Normal renal ultrasound. Electronically Signed   By: Margarette Canada M.D.   On: 01/24/2021 11:26   DG CHEST PORT 1 VIEW  Result Date: 01/23/2021 CLINICAL DATA:  Respiratory failure EXAM: PORTABLE CHEST 1 VIEW COMPARISON:  04/02/2020 FINDINGS: Low volume AP portable examination with perhaps minimal bibasilar atelectasis. Mild cardiomegaly. Esophagogastric tube with tip and side port below the diaphragm. IMPRESSION: 1. Low volume AP portable examination with perhaps minimal bibasilar atelectasis. 2. Mild cardiomegaly. Electronically Signed   By: Eddie Candle M.D.   On: 01/23/2021 14:44   DG Abd  Portable 1V  Result Date: 01/24/2021 CLINICAL DATA:  Abdominal pain and small-bowel obstruction. EXAM: PORTABLE ABDOMEN - 1 VIEW COMPARISON:  01/23/2021 and prior studies FINDINGS: NG tube again noted with tip overlying the mid stomach. Contrast and gas-filled small bowel loops are noted, none which appear distended now. No dilated bowel loops are present. IMPRESSION: Contrast and gas-filled small bowel loops without bowel distention. Electronically Signed   By: Margarette Canada M.D.   On: 01/24/2021 09:12   DG Abd Portable 1V-Small Bowel Obstruction Protocol-24 hr delay  Result Date: 01/23/2021 CLINICAL DATA:  Abdominal pain, small-bowel obstruction EXAM: PORTABLE ABDOMEN - 1 VIEW COMPARISON:  01/23/2021 at 6:43 a.m. FINDINGS: Supine frontal views  of the abdomen and pelvis are obtained. Enteric catheter tip and side port project over the gastric body. Moderate stool throughout the colon. Distended gas-filled loops of small bowel are seen within the central abdomen, measuring up to 3.4 cm in diameter. The previously administered oral contrast is too dilute to be visualized. No acute bony abnormalities. IMPRESSION: 1. Persisting gaseous distension of small bowel consistent with ileus or obstruction. The previously administered oral contrast is too dilute to be identified on this exam. 2. Moderate stool throughout the colon. Electronically Signed   By: Randa Ngo M.D.   On: 01/23/2021 23:29    Anti-infectives: Anti-infectives (From admission, onward)   Start     Dose/Rate Route Frequency Ordered Stop   01/23/21 1400  piperacillin-tazobactam (ZOSYN) IVPB 3.375 g        3.375 g 12.5 mL/hr over 240 Minutes Intravenous Every 8 hours 01/23/21 1244        Assessment/Plan: Problem List: Patient Active Problem List   Diagnosis Date Noted  . AKI (acute kidney injury) ([place]) 01/23/2021  . Rheumatoid arthritis ([place]) 01/23/2021  . SBO (small bowel obstruction) ([place]) 01/22/2021  . Postoperative anemia due to acute blood loss 06/18/2018  . Type 2 diabetes mellitus with diabetic neuropathy, unspecified ([place]) 06/18/2018  . Peripheral neuropathy 06/18/2018  . 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 Gait difficulty 06/18/2018  . Community acquired pneumonia of right lower lobe of lung 06/11/2018  . S/P shoulder replacement, right 06/02/2018  . Prolonged Q-T interval on ECG 04/20/2017  . Pulmonary edema 04/20/2017  . Chest pain 10/18/2013  . Abnormal nuclear stress test 10/18/2013  . Arm pain 10/18/2013  . Hypertension     She reports some flatus.  NG is in place with output.  She had some stooling yesterday.  Will continue observation for immediate future.   * No surgery found *    LOS: 3 days   Matt B. Hassell Done, MD, [facility] [phone] to reach the surgeon on call.    01/25/2021 9:03 AM

## 2021-01-26 LAB — GLUCOSE, CAPILLARY
Glucose-Capillary: 139 mg/dL — ABNORMAL HIGH (ref 70–99)
Glucose-Capillary: 176 mg/dL — ABNORMAL HIGH (ref 70–99)
Glucose-Capillary: 185 mg/dL — ABNORMAL HIGH (ref 70–99)
Glucose-Capillary: 190 mg/dL — ABNORMAL HIGH (ref 70–99)
Glucose-Capillary: 257 mg/dL — ABNORMAL HIGH (ref 70–99)

## 2021-01-26 LAB — CBC
HCT: 44.3 % (ref 36.0–46.0)
Hemoglobin: 14.9 g/dL (ref 12.0–15.0)
MCH: 29.7 pg (ref 26.0–34.0)
MCHC: 33.6 g/dL (ref 30.0–36.0)
MCV: 88.2 fL (ref 80.0–100.0)
Platelets: 206 10*3/uL (ref 150–400)
RBC: 5.02 MIL/uL (ref 3.87–5.11)
RDW: 14.8 % (ref 11.5–15.5)
WBC: 11.6 10*3/uL — ABNORMAL HIGH (ref 4.0–10.5)
nRBC: 0 % (ref 0.0–0.2)

## 2021-01-26 LAB — BASIC METABOLIC PANEL
Anion gap: 15 (ref 5–15)
BUN: 11 mg/dL (ref 8–23)
CO2: 18 mmol/L — ABNORMAL LOW (ref 22–32)
Calcium: 8.3 mg/dL — ABNORMAL LOW (ref 8.9–10.3)
Chloride: 108 mmol/L (ref 98–111)
Creatinine, Ser: 0.63 mg/dL (ref 0.44–1.00)
GFR, Estimated: >60 mL/min (ref >60)
Glucose, Bld: 186 mg/dL — ABNORMAL HIGH (ref 70–99)
Potassium: 3.2 mmol/L — ABNORMAL LOW (ref 3.5–5.1)
Sodium: 141 mmol/L (ref 135–145)

## 2021-01-26 MED ORDER — POTASSIUM CHLORIDE 10 MEQ/100ML IV SOLN
10.0000 meq | INTRAVENOUS | Status: AC
  Administered 2021-01-26 (×3): 10 meq via INTRAVENOUS
  Filled 2021-01-26: qty 100

## 2021-01-26 MED ORDER — POTASSIUM CHLORIDE 10 MEQ/100ML IV SOLN
10.0000 meq | INTRAVENOUS | Status: AC
  Administered 2021-01-26: 10 meq via INTRAVENOUS
  Filled 2021-01-26: qty 100

## 2021-01-26 NOTE — Progress Notes (Signed)
Clamped NG tube for 2 hours. Pt intake 400 mL clears. Reattached to suction for 1 hour. Output 150 mL.

## 2021-01-26 NOTE — Progress Notes (Signed)
Subjective: CC: Patient reports that her abdominal pain has overall improved and mainly with soreness around her umbilicus and epigastrium. She denies any nausea.  She is passing flatus and had 2 BMs yesterday, (unsure what they looked like).  900 cc from NG tube yesterday.  She reports she has not gotten out of bed in the last several days.   Objective: Vital signs in last 24 hours: Temp:  [98.2 F (36.8 C)-98.8 F (37.1 C)] 98.2 F (36.8 C) (03/28 0410) Pulse Rate:  [71-76] 76 (03/28 0600) Resp:  [15-18] 18 (03/28 0410) BP: (162-182)/(78-85) 162/78 (03/28 0600) SpO2:  [96 %-100 %] 96 % (03/28 0410) Last BM Date: 01/25/21  Intake/Output from previous day: 03/27 0701 - 03/28 0700 In: 1989.2 [P.O.:60; I.V.:1773.6; IV Piggyback:155.7] Out: 2400 [Urine:1500; Emesis/NG output:900] Intake/Output this shift: No intake/output data recorded.  PE: Gen:  Alert, NAD, pleasant Card:  Reg Abd: Soft, mild distension, generalized tenderness worst in the epigastrium without peritonitis, +BS, NGT w/ bilious like output in cannister (900cc/24 hours) Psych: A&Ox3  Skin: no rashes noted, warm and dry  Lab Results:  Recent Labs    01/24/21 0321 01/25/21 0153  WBC 7.1 6.4  HGB 14.9 14.9  HCT 45.2 46.9*  PLT 252 156   BMET Recent Labs    01/24/21 0321 01/25/21 0153  NA 140 147*  K 3.8 4.0  CL 105 114*  CO2 23 25  GLUCOSE 224* 133*  BUN 48* 26*  CREATININE 1.27* 0.68  CALCIUM 8.7* 8.5*   PT/INR No results for input(s): LABPROT, INR in the last 72 hours. CMP     Component Value Date/Time   NA 147 (H) 01/25/2021 0153   NA 145 06/19/2018 0000   K 4.0 01/25/2021 0153   CL 114 (H) 01/25/2021 0153   CO2 25 01/25/2021 0153   GLUCOSE 133 (H) 01/25/2021 0153   BUN 26 (H) 01/25/2021 0153   BUN 7 06/19/2018 0000   CREATININE 0.68 01/25/2021 0153   CALCIUM 8.5 (L) 01/25/2021 0153   PROT 7.8 01/22/2021 0923   ALBUMIN 3.9 01/22/2021 0923   AST 22 01/22/2021 0923   ALT 16  01/22/2021 0923   ALKPHOS 55 01/22/2021 0923   BILITOT 0.4 01/22/2021 0923   GFRNONAA >60 01/25/2021 0153   GFRAA >60 12/09/2019 2243   Lipase     Component Value Date/Time   LIPASE 42 01/22/2021 0923       Studies/Results: US RENAL  Result Date: 01/24/2021 CLINICAL DATA:  75 year old female with acute kidney injury. EXAM: RENAL / URINARY TRACT ULTRASOUND COMPLETE COMPARISON:  None. FINDINGS: Right Kidney: Renal measurements: 10.9 x 5.2 x 5.5 cm = volume: 163 mL. Echogenicity within normal limits. No mass or hydronephrosis visualized. Left Kidney: Renal measurements: 11.8 x 6.4 x 6.2 cm = volume: 244 mL. Echogenicity within normal limits. No mass or hydronephrosis visualized. Pseudo mass in the UPPER LEFT kidney noted. Bladder: Appears normal for degree of bladder distention. Other: None. IMPRESSION: Normal renal ultrasound. Electronically Signed   By: Margarette Canada M.D.   On: 01/24/2021 11:26    Anti-infectives: Anti-infectives (From admission, onward)   Start     Dose/Rate Route Frequency Ordered Stop   01/23/21 1400  piperacillin-tazobactam (ZOSYN) IVPB 3.375 g        3.375 g 12.5 mL/hr over 240 Minutes Intravenous Every 8 hours 01/23/21 1244       CT 3/24 Partial small bowel obstruction, with dilated fluid-filled loops of small bowel  as well as stool sign within distended distal small bowel loops extending to the ileocecal valve.  Associated mesenteric edema within some of the fluid-filled small bowel loops in the RIGHT mid abdomen as well as mild wall thickening of the cecum.  Thickening of base of appendix though the remainder of the appendix appears normal.  Differential diagnosis would include stricture at the ileocecal valve with partial small bowel obstruction, inflammatory bowel disease involving the cecum and ileocecal valve with partial obstruction versus functional obstruction, or partial small bowel obstruction secondary to adhesions with associated edema  and inflammation.  Findings do not appear to represent appendicitis and there is no evidence of perforation or abscess.  Assessment/Plan Diabetes mellitus type 2 Obesity Hypertension Rheumatoid arthritis on Plaquenil and methotrexate  SBO - Hx of hysterectomy, lap chole and umbilical hernia repair - CT scan findings as noted above. GI is following for inflammation at the cecum w/ ? Structure at the TI vs edema/inflammation. She is on abx and steroids. Appreciate GI's assistance - Although she does have thickening at the base of the appendix on CT, radiology felt the remainder of the appendix appeared normal and did not represent appendicitis. She does not have focal tenderness of the RLQ  - Patient reports overall less pain and no nausea. NGT output < 1L/24 hours. She is passing flatus and had 2 BM's yesterday. Xray 3/26 with no small bowel distension. Will try clamping trials today - Mobilize. I have ordered PT - Keep K>4 and Mg>2 for bowel function  FEN - NPO, NGT clamped VTE - SCDs, Loveonx ID - None   LOS: 4 days    Jillyn Ledger , [facility] 01/26/2021, 9:32 AM Please see Amion for pager number during day hours 7:00am-4:30pm

## 2021-01-26 NOTE — Progress Notes (Signed)
Subjective: Diane [name] is a 75 year old black female well-known to me from my practice.  She was admitted on 01/22/2021 with abdominal pain thought to be due to partial small bowel obstruction she has done well on IV steroids and NG  decompression. She claims abdominal discomfort has improved tremendously. She is passing flatus and had 2 BMs yesterday. She denies any having any nausea vomiting at this time Objective: Vital signs in last 24 hours: Temp:  [98.2 F (36.8 C)-98.8 F (37.1 C)] 98.2 F (36.8 C) (03/28 0410) Pulse Rate:  [71-76] 76 (03/28 0600) Resp:  [15-18] 18 (03/28 0410) BP: (162-182)/(78-85) 162/78 (03/28 0600) SpO2:  [96 %-100 %] 96 % (03/28 0410) Last BM Date: 01/25/21  Intake/Output from previous day: 03/27 0701 - 03/28 0700 In: 1989.2 [P.O.:60; I.V.:1773.6; IV Piggyback:155.7] Out: 2400 [Urine:1500; Emesis/NG output:900] Intake/Output this shift: Total I/O In: -  Out: 350 [Urine:350]  General appearance: alert, cooperative, appears stated age, no distress and moderately obese Resp: clear to auscultation bilaterally Cardio: regular rate and rhythm, S1, S2 normal, no murmur, click, rub or gallop GI: soft, non-tender; bowel sounds normal; no masses,  no organomegaly Extremities: extremities normal, atraumatic, no cyanosis or edema  Lab Results: Recent Labs    01/24/21 0321 01/25/21 0153 01/26/21 0842  WBC 7.1 6.4 11.6*  HGB 14.9 14.9 14.9  HCT 45.2 46.9* 44.3  PLT 252 156 206   BMET Recent Labs    01/24/21 0321 01/25/21 0153 01/26/21 0842  NA 140 147* 141  K 3.8 4.0 3.2*  CL 105 114* 108  CO2 23 25 18*  GLUCOSE 224* 133* 186*  BUN 48* 26* 11  CREATININE 1.27* 0.68 0.63  CALCIUM 8.7* 8.5* 8.3*   Medications: I have reviewed the patient's current medications.  Assessment/Plan: 1) Partial small bowel obstruction improving clinically. 2) Insulin-dependent diabetes mellitus poorly controlled hyperglycemia 3) Hypertension. 4) Rheumatoid  arthritis. 5) Anxiety and depression  LOS: 4 days   Juanita Craver 01/26/2021, 1:20 PM

## 2021-01-26 NOTE — Evaluation (Signed)
Occupational Therapy Evaluation Patient Details Name: Diane [name] MRN: [id number] DOB: 10/11/1946 Today's Date: 01/26/2021    History of Present Illness Pt is a 75 y/o female admitted 3/24 secondary to nausea and vomiting. Pt found to have small bowel obstruction and had NG tube placed. PMH includes DM, RA, lap chole, HTN, umbilical hernia repair.   Clinical Impression   This 75 yo female admitted with above presents to acute OT with PLOF of being mainly independent with basic ADLs with occasional A from dtr. Currently she is setup-min A for basic ADLs. She will benefit from acute OT with follow up [place]. I also gave her information on the [facility] program here in [place].    Follow Up Recommendations  Home health OT;Supervision/Assistance - 24 hour    Equipment Recommendations  None recommended by OT       Precautions / Restrictions Precautions Precautions: Fall Precaution Comments: NGT Restrictions Weight Bearing Restrictions: No      Mobility Bed Mobility               General bed mobility comments: In recliner upon entry    Transfers Overall transfer level: Needs assistance Equipment used: Rolling walker (2 wheeled) Transfers: Sit to/from Stand Sit to Stand: Min assist         General transfer comment: Cues for safe hand placement.    Balance Overall balance assessment: Needs assistance Sitting-balance support: No upper extremity supported;Feet supported Sitting balance-Leahy Scale: Good     Standing balance support: Bilateral upper extremity supported Standing balance-Leahy Scale: Poor Standing balance comment: Reliant on BUE support                           ADL either performed or assessed with clinical judgement   ADL Overall ADL's : Needs assistance/impaired Eating/Feeding: Independent;Sitting   Grooming: Set up;Sitting   Upper Body Bathing: Set up;Sitting   Lower Body Bathing: Minimal assistance;Sit to/from  stand   Upper Body Dressing : Set up;Sitting   Lower Body Dressing: Minimal assistance;Sit to/from stand   Toilet Transfer: Minimal assistance;RW   Toileting- Clothing Manipulation and Hygiene: Minimal assistance;Sit to/from stand               Vision Patient Visual Report: No change from baseline              Pertinent Vitals/Pain Pain Assessment: Faces Faces Pain Scale: Hurts little more Pain Location: abdomen Pain Descriptors / Indicators: Grimacing;Guarding Pain Intervention(s): Limited activity within patient's tolerance;Repositioned;Monitored during session     Hand Dominance Right   Extremity/Trunk Assessment Upper Extremity Assessment Upper Extremity Assessment: Overall WFL for tasks assessed     Communication Communication Communication: No difficulties   Cognition Arousal/Alertness: Awake/alert Behavior During Therapy: WFL for tasks assessed/performed Overall Cognitive Status: Within Functional Limits for tasks assessed                                     General Comments  Pt's daughter present during session.             Home Living Family/patient expects to be discharged to:: Private residence Living Arrangements: Alone Available Help at Discharge: Family;Available 24 hours/day Type of Home: House Home Access: Level entry     Home Layout: Two level;Able to live on main level with bedroom/bathroom     Bathroom Shower/Tub: Tub/shower unit;Curtain  Bathroom Toilet: Standard     Home Equipment: Hand held shower head;Grab bars - tub/shower          Prior Functioning/Environment Level of Independence: Needs assistance  Gait / Transfers Assistance Needed: Independent with ambulation ADL's / Homemaking Assistance Needed: Reports daughter assists with bathing and dressing as needed.            OT Problem List: Decreased range of motion;Impaired balance (sitting and/or standing);Pain      OT  Treatment/Interventions: Self-care/ADL training;DME and/or AE instruction;Patient/family education;Balance training    OT Goals(Current goals can be found in the care plan section) Acute Rehab OT Goals Patient Stated Goal: to go home OT Goal Formulation: With patient Time For Goal Achievement: 02/09/21 Potential to Achieve Goals: Good  OT Frequency: Min 2X/week              AM-PAC OT "6 Clicks" Daily Activity     Outcome Measure Help from another person eating meals?: None Help from another person taking care of personal grooming?: A Little Help from another person toileting, which includes using toliet, bedpan, or urinal?: A Little Help from another person bathing (including washing, rinsing, drying)?: A Little Help from another person to put on and taking off regular upper body clothing?: A Little Help from another person to put on and taking off regular lower body clothing?: A Little 6 Click Score: 19   End of Session Equipment Utilized During Treatment: Rolling walker  Activity Tolerance: Patient tolerated treatment well Patient left: in chair;with call bell/phone within reach;with family/visitor present  OT Visit Diagnosis: Unsteadiness on feet (R26.81);Muscle weakness (generalized) (M62.81);Pain Pain - part of body:  (abdomen)                Time: 1541-1600 OT Time Calculation (min): 19 min Charges:  OT General Charges $OT Visit: 1 Visit OT Evaluation $OT Eval Moderate Complexity: 1 Mod  [place] [address], OTR/L Acute [employer] Pager [phone] Office [phone]     Almon Register 01/26/2021, 5:49 PM

## 2021-01-26 NOTE — Evaluation (Signed)
Physical Therapy Evaluation Patient Details Name: Diane [name] MRN: [id number] DOB: Oct 14, 1946 Today's Date: 01/26/2021   History of Present Illness  Pt is a 75 y/o female admitted 3/24 secondary to nausea and vomiting. Pt found to have small bowel obstruction and had NG tube placed. PMH includes DM, RA, lap chole, HTN, umbilical hernia repair.  Clinical Impression  Pt admitted secondary to problem above with deficits below. Pt requiring min to min guard A for mobility using RW. Pt with increased fatigue which limited ambulation. Pt reports her daughter can assist as needed upon d/c. Pt's daughter asking about having aide at home to help with ADL tasks as pt has RA and is having difficulty. Will need to use RW at home for increased safety. Will continue to follow acutely.     Follow Up Recommendations Home health PT;Supervision for mobility/OO[facility])    Equipment Recommendations  Rolling walker with 5" wheels;3in1 (PT)    Recommendations for Other Services       Precautions / Restrictions Precautions Precautions: Fall Restrictions Weight Bearing Restrictions: No      Mobility  Bed Mobility               General bed mobility comments: In recliner upon entry    Transfers Overall transfer level: Needs assistance Equipment used: Rolling walker (2 wheeled) Transfers: Sit to/from Stand Sit to Stand: Min assist         General transfer comment: Min A for lift assist. Cues for safe hand placement.  Ambulation/Gait Ambulation/Gait assistance: Min guard Gait Distance (Feet): 50 Feet Assistive device: Rolling walker (2 wheeled) Gait Pattern/deviations: Step-through pattern;Decreased stride length Gait velocity: Decreased   General Gait Details: Very slow, guarded gait. Min guard for safety. Pt with increased fatigue which limited gait distance.  Stairs            Wheelchair Mobility    Modified Rankin (Stroke Patients Only)       Balance Overall  balance assessment: Needs assistance Sitting-balance support: Feet supported Sitting balance-Leahy Scale: Good     Standing balance support: Bilateral upper extremity supported;During functional activity Standing balance-Leahy Scale: Poor Standing balance comment: Reliant on BUE support                             Pertinent Vitals/Pain Pain Assessment: Faces Faces Pain Scale: Hurts little more Pain Location: abdomen Pain Descriptors / Indicators: Grimacing;Guarding Pain Intervention(s): Limited activity within patient's tolerance;Monitored during session;Repositioned    Home Living Family/patient expects to be discharged to:: Private residence Living Arrangements: Alone Available Help at Discharge: Family;Available 24 hours/day Type of Home: House Home Access: Level entry     Home Layout: Two level;Able to live on main level with bedroom/bathroom Home Equipment: None      Prior Function Level of Independence: Needs assistance   Gait / Transfers Assistance Needed: Independent with ambulation  ADL's / Homemaking Assistance Needed: Reports daughter assists with bathing and dressing as needed.        Hand Dominance        Extremity/Trunk Assessment   Upper Extremity Assessment Upper Extremity Assessment: Defer to OT evaluation    Lower Extremity Assessment Lower Extremity Assessment: Generalized weakness    Cervical / Trunk Assessment Cervical / Trunk Assessment: Normal  Communication   Communication: No difficulties  Cognition Arousal/Alertness: Awake/alert Behavior During Therapy: WFL for tasks assessed/performed Overall Cognitive Status: Within Functional Limits for tasks assessed  General Comments General comments (skin integrity, edema, etc.): Pt's daughter present during session. Asking about getting aide if available to help with home tasks.    Exercises     Assessment/Plan    PT  Assessment Patient needs continued PT services  PT Problem List Decreased strength;Decreased activity tolerance;Decreased balance;Decreased mobility;Decreased knowledge of use of DME;Decreased knowledge of precautions       PT Treatment Interventions DME instruction;Gait training;Functional mobility training;Therapeutic activities;Therapeutic exercise;Balance training;Patient/family education    PT Goals (Current goals can be found in the Care Plan section)  Acute Rehab PT Goals Patient Stated Goal: to go home PT Goal Formulation: With patient Time For Goal Achievement: 02/09/21 Potential to Achieve Goals: Good    Frequency Min 3X/week   Barriers to discharge        Co-evaluation               AM-PAC PT "6 Clicks" Mobility  Outcome Measure Help needed turning from your back to your side while in a flat bed without using bedrails?: None Help needed moving from lying on your back to sitting on the side of a flat bed without using bedrails?: A Little Help needed moving to and from a bed to a chair (including a wheelchair)?: A Little Help needed standing up from a chair using your arms (e.g., wheelchair or bedside chair)?: A Little Help needed to walk in hospital room?: A Little Help needed climbing 3-5 steps with a railing? : A Little 6 Click Score: 19    End of Session Equipment Utilized During Treatment: Gait belt Activity Tolerance: Patient limited by fatigue Patient left: in chair;with call bell/phone within reach;with family/visitor present;with nursing/sitter in room Nurse Communication: Mobility status PT Visit Diagnosis: Unsteadiness on feet (R26.81);Muscle weakness (generalized) (M62.81)    Time: 7001-7494 PT Time Calculation (min) (ACUTE ONLY): 26 min   Charges:   PT Evaluation $PT Eval Low Complexity: 1 Low PT Treatments $Gait Training: 8-22 mins        Lou Miner, DPT  Acute Rehabilitation Services  Pager: [phone] Office: [phone]   Rudean Hitt 01/26/2021, 3:33 PM

## 2021-01-26 NOTE — Progress Notes (Signed)
PROGRESS NOTE    Diane [name]  AYT:[id number] DOB: 10/27/1946 DOA: 01/22/2021 PCP: [name], MD     Brief Narrative:  Diane [name] is a 75 y.o. female with medical history significant of HTN, IDDM, rheumatoid arthritis, anxiety, depression, mild cognitive deficit who presented with new onset of abdominal pain, nausea, vomiting. Her symptoms started last night after dinner.  Abdominal pain vaguely localized on the right aspect near umbilicus, pain has been cramping intermittent but becomes more constant this morning. Abdominal x-ray showed small bowel perforation, CT abdomen showed dilated bowels, suspicious for ileus cecal wall stricture.  General surgery as well as GI were consulted.  New events last 24 hours / Subjective: States that she is feeling better. No nausea, vomiting. Abdominal pain is resolved when sitting still, but still TTP. Passing gas and BM yesterday.   Assessment & Plan:   Principal Problem:   SBO (small bowel obstruction) (HCC) Active Problems:   Prolonged Q-T interval on ECG   Type 2 diabetes mellitus with diabetic neuropathy, unspecified (HCC)   Peripheral neuropathy   AKI (acute kidney injury) ([place])   Rheumatoid arthritis (HCC)   pSBO -CT A/P: Partial small bowel obstruction, with dilated fluid-filled loops of small bowel as well as stool sign within distended distal small bowel loops extending to the ileocecal valve. Associated mesenteric edema within some of the fluid-filled small bowel loops in the RIGHT mid abdomen as well as mild wall thickening of the cecum. Thickening of base of appendix though the remainder of the appendix appears normal. Findings do not appear to represent appendicitis and there is no evidence of perforation or abscess. -Continue n.p.o., IV fluid -General surgery, GI following -Trial of Solu-Medrol due to possibility of inflammatory component -Improving. NGT clamp trial per gen surg   Severe sepsis, not present on  admission, question abdominal source -Patient was afebrile, HR 81, RR 18, WBC 8.6 at the time of admission.  This morning, patient remains tachycardic with leukocytosis 14.9 and lactic acidosis 4.2 with AKI -Covid, influenza negative -UA negative, urine culture negative -Chest x-ray unremarkable -Blood cultures negative to date -Continue empiric IV Zosyn to cover intra-abdominal source -Leukocytosis initially resolved, now elevated but could be due to steroid use. Monitor   Hypernatremia -Resolved   Acute hypoxemic respiratory failure -Patient with SPO2 89% on room air, currently on 2 L nasal cannula O2, wean as able -Chest x-ray unremarkable  AKI -Presented with creatinine 0.6, up to 1.27  -Renal ultrasound unremarkable -Resolved  Diabetes mellitus, insulin-dependent, poorly controlled with hyperglycemia -Hemoglobin A1c 7.3 -Hold home Metformin, Neurontin -Continue Lantus, continue sliding scale insulin  Hypertension -Hold home medication including Norvasc, telmisartan while n.p.o.  Rheumatoid arthritis -Hold home Plaquenil, Norco, methotrexate  Anxiety/depression -Hold home Cymbalta  Mild cognitive deficit -Per daughter, patient has had some memory loss over the years, possibly early beginning of dementia.  Patient does live at home alone and is independent of ADLs at baseline  Prolonged QTc -Repeat EKG reviewed independently reveals QTC 500  Hypokalemia -Replace, trend    DVT prophylaxis:  enoxaparin (LOVENOX) injection 40 mg Start: 01/23/21 1000  Code Status: Full code Family Communication: No family at bedside, updated daughter yesterday over the phone Disposition Plan:  Status is: Inpatient  Remains inpatient appropriate because:IV treatments appropriate due to intensity of illness or inability to take PO and Inpatient level of care appropriate due to severity of illness   Dispo: The patient is from: Home  Anticipated d/c is to: Home               Patient currently is not medically stable to d/c.  Continue treatment for SBO, continue IV fluid and IV Solu-Medrol and IV antibiotics. PT ordered    Difficult to place patient No      Consultants:  General surgery GI  Antimicrobials:  Anti-infectives (From admission, onward)   Start     Dose/Rate Route Frequency Ordered Stop   01/23/21 1400  piperacillin-tazobactam (ZOSYN) IVPB 3.375 g        3.375 g 12.5 mL/hr over 240 Minutes Intravenous Every 8 hours 01/23/21 1244         Objective: Vitals:   01/25/21 1345 01/25/21 2036 01/26/21 0410 01/26/21 0600  BP: (!) 164/84 (!) 174/79 (!) 182/85 (!) 162/78  Pulse: 76 75 71 76  Resp: 15 17 18    Temp: 98.4 F (36.9 C) 98.8 F (37.1 C) 98.2 F (36.8 C)   TempSrc: Oral Oral Oral   SpO2: 97% 100% 96%   Weight:      Height:        Intake/Output Summary (Last 24 hours) at 01/26/2021 0951 Last data filed at 01/26/2021 0600 Gross per 24 hour  Intake 1989.24 ml  Output 1700 ml  Net 289.24 ml   Filed Weights   01/22/21 0847  Weight: 98.4 kg    Examination: General exam: Appears calm and comfortable  Respiratory system: Clear to auscultation. Respiratory effort normal. Cardiovascular system: S1 & S2 heard, RRR. No pedal edema. Gastrointestinal system: Abdomen is nondistended, soft and TTP, +bowel sounds  Central nervous system: Alert and oriented. Non focal exam. Speech clear  Extremities: Symmetric in appearance bilaterally  Skin: No rashes, lesions or ulcers on exposed skin  Psychiatry: Judgement and insight appear stable. Mood & affect appropriate.    Data Reviewed: I have personally reviewed following labs and imaging studies  CBC: Recent Labs  Lab 01/22/21 0923 01/23/21 0059 01/24/21 0321 01/25/21 0153 01/26/21 0842  WBC 8.6 14.9* 7.1 6.4 11.6*  NEUTROABS 7.2  --   --   --   --   HGB 14.3 17.7* 14.9 14.9 14.9  HCT 44.1 53.7* 45.2 46.9* 44.3  MCV 90.6 88.6 88.3 92.5 88.2  PLT 232 311 252 156 673    Basic Metabolic Panel: Recent Labs  Lab 01/22/21 0923 01/23/21 0059 01/24/21 0321 01/25/21 0153  NA 137 137 140 147*  K 3.9 4.1 3.8 4.0  CL 100 104 105 114*  CO2 27 17* 23 25  GLUCOSE 225* 316* 224* 133*  BUN 8 18 48* 26*  CREATININE 0.60 1.03* 1.27* 0.68  CALCIUM 9.1 9.0 8.7* 8.5*   GFR: Estimated Creatinine Clearance: 75.7 mL/min (by C-G formula based on SCr of 0.68 mg/dL). Liver Function Tests: Recent Labs  Lab 01/22/21 0923  AST 22  ALT 16  ALKPHOS 55  BILITOT 0.4  PROT 7.8  ALBUMIN 3.9   Recent Labs  Lab 01/22/21 0923  LIPASE 42   No results for input(s): AMMONIA in the last 168 hours. Coagulation Profile: No results for input(s): INR, PROTIME in the last 168 hours. Cardiac Enzymes: No results for input(s): CKTOTAL, CKMB, CKMBINDEX, TROPONINI in the last 168 hours. BNP (last 3 results) No results for input(s): PROBNP in the last 8760 hours. HbA1C: No results for input(s): HGBA1C in the last 72 hours. CBG: Recent Labs  Lab 01/25/21 0524 01/25/21 1155 01/25/21 1816 01/25/21 2345 01/26/21 0504  GLUCAP 107* 201* 193*  139* 185*   Lipid Profile: No results for input(s): CHOL, HDL, LDLCALC, TRIG, CHOLHDL, LDLDIRECT in the last 72 hours. Thyroid Function Tests: No results for input(s): TSH, T4TOTAL, FREET4, T3FREE, THYROIDAB in the last 72 hours. Anemia Panel: No results for input(s): VITAMINB12, FOLATE, FERRITIN, TIBC, IRON, RETICCTPCT in the last 72 hours. Sepsis Labs: Recent Labs  Lab 01/23/21 0059 01/23/21 1335 01/23/21 1606  LATICACIDVEN 4.2* 3.5* 2.8*    Recent Results (from the past 240 hour(s))  Resp Panel by RT-PCR (Flu A&B, Covid) Nasopharyngeal Swab     Status: None   Collection Time: 01/22/21  3:40 PM   Specimen: Nasopharyngeal Swab; Nasopharyngeal(NP) swabs in vial transport medium  Result Value Ref Range Status   SARS Coronavirus 2 by RT PCR NEGATIVE NEGATIVE Final    Comment: (NOTE) SARS-CoV-2 target nucleic acids are NOT  DETECTED.  The SARS-CoV-2 RNA is generally detectable in upper respiratory specimens during the acute phase of infection. The lowest concentration of SARS-CoV-2 viral copies this assay can detect is 138 copies/mL. A negative result does not preclude SARS-Cov-2 infection and should not be used as the sole basis for treatment or other patient management decisions. A negative result may occur with  improper specimen collection/handling, submission of specimen other than nasopharyngeal swab, presence of viral mutation(s) within the areas targeted by this assay, and inadequate number of viral copies(<138 copies/mL). A negative result must be combined with clinical observations, patient history, and epidemiological information. The expected result is Negative.  Fact Sheet for Patients:  EntrepreneurPulse.com.au  Fact Sheet for Healthcare Providers:  IncredibleEmployment.be  This test is no t yet approved or cleared by the [place] FDA and  has been authorized for detection and/or diagnosis of SARS-CoV-2 by FDA under an Emergency Use Authorization (EUA). This EUA will remain  in effect (meaning this test can be used) for the duration of the COVID-19 declaration under Section 564(b)(1) of the Act, 21 U.S.C.section 360bbb-3(b)(1), unless the authorization is terminated  or revoked sooner.       Influenza A by PCR NEGATIVE NEGATIVE Final   Influenza B by PCR NEGATIVE NEGATIVE Final    Comment: (NOTE) The Xpert Xpress SARS-CoV-2/FLU/RSV plus assay is intended as an aid in the diagnosis of influenza from Nasopharyngeal swab specimens and should not be used as a sole basis for treatment. Nasal washings and aspirates are unacceptable for Xpert Xpress SARS-CoV-2/FLU/RSV testing.  Fact Sheet for Patients: EntrepreneurPulse.com.au  Fact Sheet for Healthcare Providers: IncredibleEmployment.be  This test is not yet  approved or cleared by the [place] FDA and has been authorized for detection and/or diagnosis of SARS-CoV-2 by FDA under an Emergency Use Authorization (EUA). This EUA will remain in effect (meaning this test can be used) for the duration of the COVID-19 declaration under Section 564(b)(1) of the Act, 21 U.S.C. section 360bbb-3(b)(1), unless the authorization is terminated or revoked.  Performed at [facility], [address] [place], [place] [address]   Culture, blood (routine x 2)     Status: None (Preliminary result)   Collection Time: 01/23/21  1:20 PM   Specimen: BLOOD  Result Value Ref Range Status   Specimen Description BLOOD RIGHT ANTECUBITAL  Final   Special Requests   Final    BOTTLES DRAWN AEROBIC ONLY Blood Culture adequate volume   Culture   Final    NO GROWTH 3 DAYS Performed at [facility], [address] [place], [place] [address]    Report Status  PENDING  Incomplete  Culture, blood (routine x 2)     Status: None (Preliminary result)   Collection Time: 01/23/21  1:30 PM   Specimen: BLOOD RIGHT HAND  Result Value Ref Range Status   Specimen Description BLOOD RIGHT HAND  Final   Special Requests   Final    BOTTLES DRAWN AEROBIC ONLY Blood Culture adequate volume   Culture   Final    NO GROWTH 3 DAYS Performed at [facility], [address] [place], [place] [address]    Report Status PENDING  Incomplete  Culture, Urine     Status: None   Collection Time: 01/24/21  8:05 AM   Specimen: Urine, Random  Result Value Ref Range Status   Specimen Description URINE, RANDOM  Final   Special Requests NONE  Final   Culture   Final    NO GROWTH Performed at [facility], [place] [address], [place], [place] [address]    Report Status 01/25/2021 FINAL  Final      Radiology Studies: No results found.    Scheduled Meds: . enoxaparin (LOVENOX) injection  40 mg Subcutaneous Q24H  . insulin aspart  0-15 Units Subcutaneous Q6H  .  insulin glargine  15 Units Subcutaneous QHS  . methylPREDNISolone (SOLU-MEDROL) injection  40 mg Intravenous Daily   Continuous Infusions: . dextrose 5 % and 0.45% NaCl 100 mL/hr at 01/26/21 0408  . piperacillin-tazobactam (ZOSYN)  IV 3.375 g (01/26/21 0508)     LOS: 4 days      Time spent: 20 minutes   Dessa Phi, DO [facility] 01/26/2021, 9:51 AM   Available via Epic secure chat 7am-7pm After these hours, please refer to coverage provider listed on [email or web address]

## 2021-01-27 LAB — BASIC METABOLIC PANEL
Anion gap: 10 (ref 5–15)
BUN: 13 mg/dL (ref 8–23)
CO2: 26 mmol/L (ref 22–32)
Calcium: 8.4 mg/dL — ABNORMAL LOW (ref 8.9–10.3)
Chloride: 108 mmol/L (ref 98–111)
Creatinine, Ser: 0.69 mg/dL (ref 0.44–1.00)
GFR, Estimated: >60 mL/min (ref >60)
Glucose, Bld: 114 mg/dL — ABNORMAL HIGH (ref 70–99)
Potassium: 3.3 mmol/L — ABNORMAL LOW (ref 3.5–5.1)
Sodium: 144 mmol/L (ref 135–145)

## 2021-01-27 LAB — CBC
HCT: 42.7 % (ref 36.0–46.0)
Hemoglobin: 13.9 g/dL (ref 12.0–15.0)
MCH: 29.4 pg (ref 26.0–34.0)
MCHC: 32.6 g/dL (ref 30.0–36.0)
MCV: 90.5 fL (ref 80.0–100.0)
Platelets: 210 10*3/uL (ref 150–400)
RBC: 4.72 MIL/uL (ref 3.87–5.11)
RDW: 14.8 % (ref 11.5–15.5)
WBC: 11.3 10*3/uL — ABNORMAL HIGH (ref 4.0–10.5)
nRBC: 0 % (ref 0.0–0.2)

## 2021-01-27 LAB — GLUCOSE, CAPILLARY
Glucose-Capillary: 121 mg/dL — ABNORMAL HIGH (ref 70–99)
Glucose-Capillary: 133 mg/dL — ABNORMAL HIGH (ref 70–99)
Glucose-Capillary: 182 mg/dL — ABNORMAL HIGH (ref 70–99)
Glucose-Capillary: 216 mg/dL — ABNORMAL HIGH (ref 70–99)

## 2021-01-27 LAB — MAGNESIUM: Magnesium: 1.9 mg/dL (ref 1.7–2.4)

## 2021-01-27 MED ORDER — POTASSIUM CHLORIDE 10 MEQ/100ML IV SOLN
10.0000 meq | INTRAVENOUS | Status: AC
  Administered 2021-01-27 (×4): 10 meq via INTRAVENOUS
  Filled 2021-01-27 (×4): qty 100

## 2021-01-27 NOTE — Progress Notes (Signed)
Subjective: No new complaints.  Abdomen is feeling well.  Objective: Vital signs in last 24 hours: Temp:  [98.3 F (36.8 C)-98.5 F (36.9 C)] 98.5 F (36.9 C) (03/29 1305) Pulse Rate:  [70-78] 76 (03/29 1305) Resp:  [16-19] 16 (03/29 1305) BP: (163-194)/(75-87) 167/86 (03/29 1305) SpO2:  [98 %-99 %] 98 % (03/29 1305) Last BM Date: 01/25/21  Intake/Output from previous day: 03/28 0701 - 03/29 0700 In: 1542.1 [P.O.:520; I.V.:919.9; IV Piggyback:102.2] Out: 1000 [Urine:850; Emesis/NG output:150] Intake/Output this shift: No intake/output data recorded.  General appearance: alert and no distress GI: soft, non-tender; bowel sounds normal; no masses,  no organomegaly  Lab Results: Recent Labs    01/25/21 0153 01/26/21 0842 01/27/21 0159  WBC 6.4 11.6* 11.3*  HGB 14.9 14.9 13.9  HCT 46.9* 44.3 42.7  PLT 156 206 210   BMET Recent Labs    01/25/21 0153 01/26/21 0842 01/27/21 0159  NA 147* 141 144  K 4.0 3.2* 3.3*  CL 114* 108 108  CO2 25 18* 26  GLUCOSE 133* 186* 114*  BUN 26* 11 13  CREATININE 0.68 0.63 0.69  CALCIUM 8.5* 8.3* 8.4*   LFT No results for input(s): PROT, ALBUMIN, AST, ALT, ALKPHOS, BILITOT, BILIDIR, IBILI in the last 72 hours. PT/INR No results for input(s): LABPROT, INR in the last 72 hours. Hepatitis Panel No results for input(s): HEPBSAG, HCVAB, HEPAIGM, HEPBIGM in the last 72 hours. C-Diff No results for input(s): CDIFFTOX in the last 72 hours. Fecal Lactopherrin No results for input(s): FECLLACTOFRN in the last 72 hours.  Studies/Results: No results found.  Medications:  Scheduled: . enoxaparin (LOVENOX) injection  40 mg Subcutaneous Q24H  . insulin aspart  0-15 Units Subcutaneous Q6H  . insulin glargine  15 Units Subcutaneous QHS  . methylPREDNISolone (SOLU-MEDROL) injection  40 mg Intravenous Daily   Continuous: . dextrose 5 % and 0.45% NaCl 75 mL/hr at 01/27/21 1709  . piperacillin-tazobactam (ZOSYN)  IV 3.375 g (01/27/21 1349)     Assessment/Plan: 1) SBO. 2) Abdominal pain - resolved.   The patient continues to improve clinically.  There is no pain with palpation, but her abdomen is still distended and tympanic.  She has hypoactive bowel sounds.  Again, it is not clear if a single intervention or a combination of interventions improved her SBO.  Plan: 1) Continue with Solumedrol for now. 2) Ambulate. 3) Correct potassium.  LOS: 5 days   Diane [name] D 01/27/2021, 5:14 PM

## 2021-01-27 NOTE — Progress Notes (Addendum)
Subjective: CC: NGT remained clamped all yesterday and this morning - she is tolerating sips of clears without n/v. She denies any abdominal pain however reports no flatus or bm yesterday.   Objective: Vital signs in last 24 hours: Temp:  [98.3 F (36.8 C)-99.7 F (37.6 C)] 98.4 F (36.9 C) (03/29 0540) Pulse Rate:  [70-78] 70 (03/29 0540) Resp:  [18-20] 19 (03/29 0540) BP: (178-194)/(75-87) 188/75 (03/29 0821) SpO2:  [99 %-100 %] 99 % (03/29 0540) Last BM Date: 01/25/21  Intake/Output from previous day: 03/28 0701 - 03/29 0700 In: 1542.1 [P.O.:520; I.V.:919.9; IV Piggyback:102.2] Out: 1000 [Urine:850; Emesis/NG output:150] Intake/Output this shift: No intake/output data recorded.  PE: Gen:  Alert, NAD, pleasant Card:  Reg Abd: Soft, mild distension, no tenderness on exam and without peritonitis, +BS, NGT is clamped Psych: A&Ox3  Skin: no rashes noted, warm and dry  Lab Results:  Recent Labs    01/26/21 0842 01/27/21 0159  WBC 11.6* 11.3*  HGB 14.9 13.9  HCT 44.3 42.7  PLT 206 210   BMET Recent Labs    01/26/21 0842 01/27/21 0159  NA 141 144  K 3.2* 3.3*  CL 108 108  CO2 18* 26  GLUCOSE 186* 114*  BUN 11 13  CREATININE 0.63 0.69  CALCIUM 8.3* 8.4*   PT/INR No results for input(s): LABPROT, INR in the last 72 hours. CMP     Component Value Date/Time   NA 144 01/27/2021 0159   NA 145 06/19/2018 0000   K 3.3 (L) 01/27/2021 0159   CL 108 01/27/2021 0159   CO2 26 01/27/2021 0159   GLUCOSE 114 (H) 01/27/2021 0159   BUN 13 01/27/2021 0159   BUN 7 06/19/2018 0000   CREATININE 0.69 01/27/2021 0159   CALCIUM 8.4 (L) 01/27/2021 0159   PROT 7.8 01/22/2021 0923   ALBUMIN 3.9 01/22/2021 0923   AST 22 01/22/2021 0923   ALT 16 01/22/2021 0923   ALKPHOS 55 01/22/2021 0923   BILITOT 0.4 01/22/2021 0923   GFRNONAA >60 01/27/2021 0159   GFRAA >60 12/09/2019 2243   Lipase     Component Value Date/Time   LIPASE 42 01/22/2021 0923        Studies/Results: No results found.  Anti-infectives: Anti-infectives (From admission, onward)   Start     Dose/Rate Route Frequency Ordered Stop   01/23/21 1400  piperacillin-tazobactam (ZOSYN) IVPB 3.375 g        3.375 g 12.5 mL/hr over 240 Minutes Intravenous Every 8 hours 01/23/21 1244 01/27/21 2359        CT 3/24 Partial small bowel obstruction, with dilated fluid-filled loops of small bowel as well as stool sign within distended distal small bowel loops extending to the ileocecal valve.  Associated mesenteric edema within some of the fluid-filled small bowel loops in the RIGHT mid abdomen as well as mild wall thickening of the cecum.  Thickening of base of appendix though the remainder of the appendix appears normal.  Differential diagnosis would include stricture at the ileocecal valve with partial small bowel obstruction, inflammatory bowel disease involving the cecum and ileocecal valve with partial obstruction versus functional obstruction, or partial small bowel obstruction secondary to adhesions with associated edema and inflammation.  Findings do not appear to represent appendicitis and there is no evidence of perforation or abscess.  Assessment/Plan Diabetes mellitus type 2 Obesity Hypertension Rheumatoid arthritis on Plaquenil and methotrexate  SBO - Hx of hysterectomy, lap chole and umbilical hernia repair -  CT scan on admission findings as noted above. GI is following for inflammation at the cecum w/ ? Structure at the TI vs edema/inflammation. She is on abx and steroids. Appreciate GI's assistance - Although she does have thickening at the base of the appendix on CT, radiology felt the remainder of the appendix appeared normal and did not represent appendicitis. She does not have focal tenderness of the RLQ and is NT on exam today with  - Patient reports no pain and no nausea with NGT clamped. She is taking in liquids but reports no  flatus or bm yesterday. She was having flatus and bm 2 days ago. Distension is stable and she is NT on exam. Will check residuals on NGT and discuss with MD how to proceed.  - Mobilize. I have ordered PT - Keep K>4 and Mg>2 for bowel function  FEN -NPO, NGT clamped, K 3.3 (primary team replacing) VTE -SCDs, Loveonx ID -Zosyn and Solumedrol. T 99.7, W 11.3   LOS: 5 days    Jillyn Ledger , [facility] Surgery 01/27/2021, 8:48 AM Please see Amion for pager number during day hours 7:00am-4:30pm

## 2021-01-27 NOTE — TOC Initial Note (Addendum)
Transition of Care [facility]) - Initial/Assessment Note    Patient Details  Name: Diane [name] MRN: [id number] Date of Birth: April 28, 1946  Transition of Care [facility] CM/SW Contact:    Marilu Favre, RN Phone Number: 01/27/2021, 11:51 AM  Clinical Narrative:                 Spoke to patient at bedside. Confirmed face sheet information. Patient from home alone. Patient states her children live close by and will help.   Patient aware [place] aide will not make daily visits.   Patient has no preference. Called Amy with [facility] she will review clinicals.   Discussed walker and 3 in1 , patient does need both. Entered orders will wait closer to discharge to call [employer] to bring to room.   Expected Discharge Plan: [place] Barriers to Discharge: Continued Medical Work up   Patient Goals and CMS Choice Patient states their goals for this hospitalization and ongoing recovery are:: to return to home CMS Medicare.gov Compare Post Acute Care list provided to:: Patient Choice offered to / list presented to : Patient  Expected Discharge Plan and Services Expected Discharge Plan: [place]   Discharge Planning Services: CM Consult Post Acute Care Choice: Home Health,Durable Medical Equipment Living arrangements for the past 2 months: Single Family Home                 DME Arranged: 3-N-1,Walker rolling DME Agency: [facility]       HH Arranged: PT,Nurse's Aide          Prior Living Arrangements/Services Living arrangements for the past 2 months: Single Family Home Lives with:: Self Patient language and need for interpreter reviewed:: Yes Do you feel safe going back to the place where you live?: Yes      Need for Family Participation in Patient Care: Yes (Comment) Care giver support system in place?: Yes (comment)   Criminal Activity/Legal Involvement Pertinent to Current Situation/Hospitalization: No - Comment as needed  Activities of Daily  Living Home Assistive Devices/Equipment: Cane (specify quad or straight),Walker (specify type) ADL Screening (condition at time of admission) Patient's cognitive ability adequate to safely complete daily activities?: Yes Is the patient deaf or have difficulty hearing?: No Does the patient have difficulty seeing, even when wearing glasses/contacts?: No Does the patient have difficulty concentrating, remembering, or making decisions?: No Patient able to express need for assistance with ADLs?: Yes Does the patient have difficulty dressing or bathing?: No Independently performs ADLs?: Yes (appropriate for developmental age) Does the patient have difficulty walking or climbing stairs?: No Weakness of Legs: Both Weakness of Arms/Hands: None  Permission Sought/Granted   Permission granted to share information with : No              Emotional Assessment Appearance:: Appears stated age Attitude/Demeanor/Rapport: Engaged Affect (typically observed): Accepting Orientation: : Oriented to Self,Oriented to Place,Oriented to  Time,Oriented to Situation Alcohol / Substance Use: Not Applicable Psych Involvement: No (comment)  Admission diagnosis:  SBO (small bowel obstruction) ([place]) [K56.609] Patient Active Problem List   Diagnosis Date Noted  . AKI (acute kidney injury) ([place]) 01/23/2021  . Rheumatoid arthritis ([place]) 01/23/2021  . SBO (small bowel obstruction) ([place]) 01/22/2021  . Postoperative anemia due to acute blood loss 06/18/2018  . Type 2 diabetes mellitus with diabetic neuropathy, unspecified ([place]) 06/18/2018  . Peripheral neuropathy 06/18/2018  . 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 Gait difficulty 06/18/2018  . Community acquired  pneumonia of right lower lobe of lung 06/11/2018  . S/P shoulder replacement, right 06/02/2018  . Prolonged Q-T interval on ECG 04/20/2017  . Pulmonary edema 04/20/2017  . Chest pain 10/18/2013  . Abnormal nuclear stress test 10/18/2013  . Arm  pain 10/18/2013  . Hypertension    PCP:  [name], MD Pharmacy:   [facility], [place] [address] Phone: [phone] Fax: [phone]     Social Determinants of Health (SDOH) Interventions    Readmission Risk Interventions No flowsheet data found.

## 2021-01-27 NOTE — Progress Notes (Signed)
PROGRESS NOTE    Diane [name]  NFA:[id number] DOB: [birth year] DOA: 01/22/2021 PCP: [name], MD     Brief Narrative:  Diane [name] is a 75 y.o. female with medical history significant of HTN, IDDM, rheumatoid arthritis, anxiety, depression, mild cognitive deficit who presented with new onset of abdominal pain, nausea, vomiting. Her symptoms started last night after dinner.  Abdominal pain vaguely localized on the right aspect near umbilicus, pain has been cramping intermittent but becomes more constant this morning. Abdominal x-ray showed small bowel perforation, CT abdomen showed dilated bowels, suspicious for ileus cecal wall stricture.  General surgery as well as GI were consulted.  New events last 24 hours / Subjective: Tolerated NG tube clamping yesterday.  Tolerated some sips of clear without any issues.  No complaints of nausea or vomiting.  Continues to have abdominal pain with palpation.  No bowel movement or flatus today or yesterday.  Assessment & Plan:   Principal Problem:   SBO (small bowel obstruction) (HCC) Active Problems:   Prolonged Q-T interval on ECG   Type 2 diabetes mellitus with diabetic neuropathy, unspecified (HCC)   Peripheral neuropathy   AKI (acute kidney injury) ([place])   Rheumatoid arthritis (HCC)   pSBO -CT A/P: Partial small bowel obstruction, with dilated fluid-filled loops of small bowel as well as stool sign within distended distal small bowel loops extending to the ileocecal valve. Associated mesenteric edema within some of the fluid-filled small bowel loops in the RIGHT mid abdomen as well as mild wall thickening of the cecum. Thickening of base of appendix though the remainder of the appendix appears normal. Findings do not appear to represent appendicitis and there is no evidence of perforation or abscess. -Continue n.p.o., IV fluid -General surgery, GI following -Trial of Solu-Medrol due to possibility of inflammatory  component -Slowly improving  Severe sepsis, not present on admission, question abdominal source -Patient was afebrile, HR 81, RR 18, WBC 8.6 at the time of admission.  This morning, patient remains tachycardic with leukocytosis 14.9 and lactic acidosis 4.2 with AKI -Covid, influenza negative -UA negative, urine culture negative -Chest x-ray unremarkable -Blood cultures negative to date -Continue empiric IV Zosyn to cover intra-abdominal source -Leukocytosis initially resolved, now elevated but could be due to steroid use. Monitor   Hypernatremia -Resolved, remains on D5-0.45NS   Acute hypoxemic respiratory failure -Patient with SPO2 89% on room air, currently on 2 L nasal cannula O2, wean as able -Chest x-ray unremarkable  AKI -Presented with creatinine 0.6, up to 1.27  -Renal ultrasound unremarkable -Resolved  Diabetes mellitus, insulin-dependent, poorly controlled with hyperglycemia -Hemoglobin A1c 7.3 -Hold home Metformin, Neurontin -Continue Lantus, continue sliding scale insulin  Hypertension -Hold home medication including Norvasc, telmisartan while n.p.o.  Rheumatoid arthritis -Hold home Plaquenil, Norco, methotrexate  Anxiety/depression -Hold home Cymbalta  Mild cognitive deficit -Per daughter, patient has had some memory loss over the years, possibly early beginning of dementia.  Patient does live at home alone and is independent of ADLs at baseline  Prolonged QTc -Repeat EKG reviewed independently reveals QTC 500  Hypokalemia -Replace, trend    DVT prophylaxis:  enoxaparin (LOVENOX) injection 40 mg Start: 01/23/21 1000  Code Status: Full code Family Communication: No family at bedside Disposition Plan:  Status is: Inpatient  Remains inpatient appropriate because:IV treatments appropriate due to intensity of illness or inability to take PO and Inpatient level of care appropriate due to severity of illness   Dispo: The patient is from: Home  Anticipated d/c is to: Home              Patient currently is not medically stable to d/c.  Continue treatment for SBO, continue IV fluid and IV Solu-Medrol and IV antibiotics.    Difficult to place patient No      Consultants:  General surgery GI  Antimicrobials:  Anti-infectives (From admission, onward)   Start     Dose/Rate Route Frequency Ordered Stop   01/23/21 1400  piperacillin-tazobactam (ZOSYN) IVPB 3.375 g        3.375 g 12.5 mL/hr over 240 Minutes Intravenous Every 8 hours 01/23/21 1244 01/27/21 2359       Objective: Vitals:   01/27/21 0540 01/27/21 0821 01/27/21 1200 01/27/21 1305  BP: (!) 194/87 (!) 188/75 (!) 163/76 (!) 167/86  Pulse: 70   76  Resp: 19   16  Temp: 98.4 F (36.9 C)   98.5 F (36.9 C)  TempSrc: Oral   Oral  SpO2: 99%   98%  Weight:      Height:        Intake/Output Summary (Last 24 hours) at 01/27/2021 1421 Last data filed at 01/27/2021 0438 Gross per 24 hour  Intake 1542.07 ml  Output 150 ml  Net 1392.07 ml   Filed Weights   01/22/21 0847  Weight: 98.4 kg    Examination: General exam: Appears calm and comfortable  Respiratory system: Clear to auscultation. Respiratory effort normal. Cardiovascular system: S1 & S2 heard, RRR. No pedal edema. Gastrointestinal system: Abdomen is nondistended, soft and not as tender to palpation from compared to previous examinations Central nervous system: Alert and oriented. Non focal exam. Speech clear  Extremities: Symmetric in appearance bilaterally  Skin: No rashes, lesions or ulcers on exposed skin  Psychiatry: Judgement and insight appear stable. Mood & affect appropriate.    Data Reviewed: I have personally reviewed following labs and imaging studies  CBC: Recent Labs  Lab 01/22/21 0923 01/23/21 0059 01/24/21 0321 01/25/21 0153 01/26/21 0842 01/27/21 0159  WBC 8.6 14.9* 7.1 6.4 11.6* 11.3*  NEUTROABS 7.2  --   --   --   --   --   HGB 14.3 17.7* 14.9 14.9 14.9 13.9  HCT 44.1  53.7* 45.2 46.9* 44.3 42.7  MCV 90.6 88.6 88.3 92.5 88.2 90.5  PLT 232 311 252 156 206 932   Basic Metabolic Panel: Recent Labs  Lab 01/23/21 0059 01/24/21 0321 01/25/21 0153 01/26/21 0842 01/27/21 0159  NA 137 140 147* 141 144  K 4.1 3.8 4.0 3.2* 3.3*  CL 104 105 114* 108 108  CO2 17* 23 25 18* 26  GLUCOSE 316* 224* 133* 186* 114*  BUN 18 48* 26* 11 13  CREATININE 1.03* 1.27* 0.68 0.63 0.69  CALCIUM 9.0 8.7* 8.5* 8.3* 8.4*  MG  --   --   --   --  1.9   GFR: Estimated Creatinine Clearance: 75.7 mL/min (by C-G formula based on SCr of 0.69 mg/dL). Liver Function Tests: Recent Labs  Lab 01/22/21 0923  AST 22  ALT 16  ALKPHOS 55  BILITOT 0.4  PROT 7.8  ALBUMIN 3.9   Recent Labs  Lab 01/22/21 0923  LIPASE 42   No results for input(s): AMMONIA in the last 168 hours. Coagulation Profile: No results for input(s): INR, PROTIME in the last 168 hours. Cardiac Enzymes: No results for input(s): CKTOTAL, CKMB, CKMBINDEX, TROPONINI in the last 168 hours. BNP (last 3 results) No results for input(s): PROBNP in  the last 8760 hours. HbA1C: No results for input(s): HGBA1C in the last 72 hours. CBG: Recent Labs  Lab 01/26/21 1543 01/26/21 1857 01/27/21 0010 01/27/21 0655 01/27/21 1215  GLUCAP 257* 190* 121* 133* 216*   Lipid Profile: No results for input(s): CHOL, HDL, LDLCALC, TRIG, CHOLHDL, LDLDIRECT in the last 72 hours. Thyroid Function Tests: No results for input(s): TSH, T4TOTAL, FREET4, T3FREE, THYROIDAB in the last 72 hours. Anemia Panel: No results for input(s): VITAMINB12, FOLATE, FERRITIN, TIBC, IRON, RETICCTPCT in the last 72 hours. Sepsis Labs: Recent Labs  Lab 01/23/21 0059 01/23/21 1335 01/23/21 1606  LATICACIDVEN 4.2* 3.5* 2.8*    Recent Results (from the past 240 hour(s))  Resp Panel by RT-PCR (Flu A&B, Covid) Nasopharyngeal Swab     Status: None   Collection Time: 01/22/21  3:40 PM   Specimen: Nasopharyngeal Swab; Nasopharyngeal(NP) swabs in  vial transport medium  Result Value Ref Range Status   SARS Coronavirus 2 by RT PCR NEGATIVE NEGATIVE Final    Comment: (NOTE) SARS-CoV-2 target nucleic acids are NOT DETECTED.  The SARS-CoV-2 RNA is generally detectable in upper respiratory specimens during the acute phase of infection. The lowest concentration of SARS-CoV-2 viral copies this assay can detect is 138 copies/mL. A negative result does not preclude SARS-Cov-2 infection and should not be used as the sole basis for treatment or other patient management decisions. A negative result may occur with  improper specimen collection/handling, submission of specimen other than nasopharyngeal swab, presence of viral mutation(s) within the areas targeted by this assay, and inadequate number of viral copies(<138 copies/mL). A negative result must be combined with clinical observations, patient history, and epidemiological information. The expected result is Negative.  Fact Sheet for Patients:  EntrepreneurPulse.com.au  Fact Sheet for Healthcare Providers:  IncredibleEmployment.be  This test is no t yet approved or cleared by the [place] FDA and  has been authorized for detection and/or diagnosis of SARS-CoV-2 by FDA under an Emergency Use Authorization (EUA). This EUA will remain  in effect (meaning this test can be used) for the duration of the COVID-19 declaration under Section 564(b)(1) of the Act, 21 U.S.C.section 360bbb-3(b)(1), unless the authorization is terminated  or revoked sooner.       Influenza A by PCR NEGATIVE NEGATIVE Final   Influenza B by PCR NEGATIVE NEGATIVE Final    Comment: (NOTE) The Xpert Xpress SARS-CoV-2/FLU/RSV plus assay is intended as an aid in the diagnosis of influenza from Nasopharyngeal swab specimens and should not be used as a sole basis for treatment. Nasal washings and aspirates are unacceptable for Xpert Xpress SARS-CoV-2/FLU/RSV testing.  Fact  Sheet for Patients: EntrepreneurPulse.com.au  Fact Sheet for Healthcare Providers: IncredibleEmployment.be  This test is not yet approved or cleared by the [place] FDA and has been authorized for detection and/or diagnosis of SARS-CoV-2 by FDA under an Emergency Use Authorization (EUA). This EUA will remain in effect (meaning this test can be used) for the duration of the COVID-19 declaration under Section 564(b)(1) of the Act, 21 U.S.C. section 360bbb-3(b)(1), unless the authorization is terminated or revoked.  Performed at [facility], [address] [place], [place] [address]   Culture, blood (routine x 2)     Status: None (Preliminary result)   Collection Time: 01/23/21  1:20 PM   Specimen: BLOOD  Result Value Ref Range Status   Specimen Description BLOOD RIGHT ANTECUBITAL  Final   Special Requests   Final    BOTTLES DRAWN AEROBIC ONLY  Blood Culture adequate volume   Culture   Final    NO GROWTH 4 DAYS Performed at [place] [address], [place], [place] [address]    Report Status PENDING  Incomplete  Culture, blood (routine x 2)     Status: None (Preliminary result)   Collection Time: 01/23/21  1:30 PM   Specimen: BLOOD RIGHT HAND  Result Value Ref Range Status   Specimen Description BLOOD RIGHT HAND  Final   Special Requests   Final    BOTTLES DRAWN AEROBIC ONLY Blood Culture adequate volume   Culture   Final    NO GROWTH 4 DAYS Performed at [facility], [place] [address] [place], [place] [address]    Report Status PENDING  Incomplete  Culture, Urine     Status: None   Collection Time: 01/24/21  8:05 AM   Specimen: Urine, Random  Result Value Ref Range Status   Specimen Description URINE, RANDOM  Final   Special Requests NONE  Final   Culture   Final    NO GROWTH Performed at [facility], [place] [address] [place], [place] [address]    Report Status 01/25/2021 FINAL  Final       Radiology Studies: No results found.    Scheduled Meds: . enoxaparin (LOVENOX) injection  40 mg Subcutaneous Q24H  . insulin aspart  0-15 Units Subcutaneous Q6H  . insulin glargine  15 Units Subcutaneous QHS  . methylPREDNISolone (SOLU-MEDROL) injection  40 mg Intravenous Daily   Continuous Infusions: . dextrose 5 % and 0.45% NaCl 100 mL/hr at 01/26/21 1210  . piperacillin-tazobactam (ZOSYN)  IV 3.375 g (01/27/21 1349)  . potassium chloride 10 mEq (01/27/21 1332)     LOS: 5 days      Time spent: 20 minutes   Dessa Phi, DO [facility] 01/27/2021, 2:21 PM   Available via Epic secure chat 7am-7pm After these hours, please refer to coverage provider listed on [email or web address]

## 2021-01-28 DIAGNOSIS — J9601 Acute respiratory failure with hypoxia: Secondary | ICD-10-CM

## 2021-01-28 DIAGNOSIS — E1140 Type 2 diabetes mellitus with diabetic neuropathy, unspecified: Secondary | ICD-10-CM

## 2021-01-28 DIAGNOSIS — N179 Acute kidney failure, unspecified: Secondary | ICD-10-CM

## 2021-01-28 DIAGNOSIS — G63 Polyneuropathy in diseases classified elsewhere: Secondary | ICD-10-CM

## 2021-01-28 LAB — BASIC METABOLIC PANEL
Anion gap: 8 (ref 5–15)
BUN: 12 mg/dL (ref 8–23)
CO2: 25 mmol/L (ref 22–32)
Calcium: 8.7 mg/dL — ABNORMAL LOW (ref 8.9–10.3)
Chloride: 107 mmol/L (ref 98–111)
Creatinine, Ser: 0.66 mg/dL (ref 0.44–1.00)
GFR, Estimated: >60 mL/min (ref >60)
Glucose, Bld: 105 mg/dL — ABNORMAL HIGH (ref 70–99)
Potassium: 3.4 mmol/L — ABNORMAL LOW (ref 3.5–5.1)
Sodium: 140 mmol/L (ref 135–145)

## 2021-01-28 LAB — GLUCOSE, CAPILLARY
Glucose-Capillary: 119 mg/dL — ABNORMAL HIGH (ref 70–99)
Glucose-Capillary: 125 mg/dL — ABNORMAL HIGH (ref 70–99)
Glucose-Capillary: 125 mg/dL — ABNORMAL HIGH (ref 70–99)
Glucose-Capillary: 192 mg/dL — ABNORMAL HIGH (ref 70–99)
Glucose-Capillary: 243 mg/dL — ABNORMAL HIGH (ref 70–99)

## 2021-01-28 LAB — CULTURE, BLOOD (ROUTINE X 2)
Culture: NO GROWTH
Culture: NO GROWTH
Special Requests: ADEQUATE
Special Requests: ADEQUATE

## 2021-01-28 LAB — CBC
HCT: 40.9 % (ref 36.0–46.0)
Hemoglobin: 13.2 g/dL (ref 12.0–15.0)
MCH: 29.7 pg (ref 26.0–34.0)
MCHC: 32.3 g/dL (ref 30.0–36.0)
MCV: 92.1 fL (ref 80.0–100.0)
Platelets: 212 10*3/uL (ref 150–400)
RBC: 4.44 MIL/uL (ref 3.87–5.11)
RDW: 14.7 % (ref 11.5–15.5)
WBC: 11.1 10*3/uL — ABNORMAL HIGH (ref 4.0–10.5)
nRBC: 0 % (ref 0.0–0.2)

## 2021-01-28 LAB — MAGNESIUM: Magnesium: 1.9 mg/dL (ref 1.7–2.4)

## 2021-01-28 MED ORDER — GABAPENTIN 300 MG PO CAPS
300.0000 mg | ORAL_CAPSULE | Freq: Two times a day (BID) | ORAL | Status: DC
  Administered 2021-01-28 – 2021-01-30 (×5): 300 mg via ORAL
  Filled 2021-01-28 (×5): qty 1

## 2021-01-28 MED ORDER — AMLODIPINE BESYLATE 5 MG PO TABS
5.0000 mg | ORAL_TABLET | Freq: Every day | ORAL | Status: DC
  Administered 2021-01-28 – 2021-01-30 (×3): 5 mg via ORAL
  Filled 2021-01-28 (×3): qty 1

## 2021-01-28 MED ORDER — ACETAMINOPHEN 325 MG PO TABS: 650.0000 mg | ORAL_TABLET | Freq: Four times a day (QID) | ORAL | Status: DC | PRN

## 2021-01-28 MED ORDER — POTASSIUM CHLORIDE CRYS ER 20 MEQ PO TBCR
40.0000 meq | EXTENDED_RELEASE_TABLET | Freq: Once | ORAL | Status: AC
  Administered 2021-01-28: 40 meq via ORAL
  Filled 2021-01-28: qty 2

## 2021-01-28 MED ORDER — ROSUVASTATIN CALCIUM 5 MG PO TABS
10.0000 mg | ORAL_TABLET | Freq: Every day | ORAL | Status: DC
  Administered 2021-01-28 – 2021-01-30 (×3): 10 mg via ORAL
  Filled 2021-01-28 (×3): qty 2

## 2021-01-28 NOTE — Progress Notes (Signed)
Subjective: CC: Patient reports her abdominal pain has resolved.  She denies any nausea or vomiting since NG tube removal.  She is tolerating clear liquids.  Reports she is passing flatus.  Objective: Vital signs in last 24 hours: Temp:  [98 F (36.7 C)-98.5 F (36.9 C)] 98 F (36.7 C) (03/30 0437) Pulse Rate:  [72-76] 72 (03/30 0437) Resp:  [16-17] 17 (03/30 0437) BP: (162-174)/(76-86) 162/76 (03/30 0437) SpO2:  [94 %-98 %] 94 % (03/30 0437) Last BM Date: 01/25/21  Intake/Output from previous day: 03/29 0701 - 03/30 0700 In: 50 [P.O.:50] Out: 400 [Urine:400] Intake/Output this shift: No intake/output data recorded.  PE: Gen: Alert, NAD, pleasant Card: Reg Abd: Soft, mild distension, no tenderness on exam and without peritonitis, +BS,  Psych: A&Ox3  Skin: no rashes noted, warm and dry  Lab Results:  Recent Labs    01/27/21 0159 01/28/21 0220  WBC 11.3* 11.1*  HGB 13.9 13.2  HCT 42.7 40.9  PLT 210 212   BMET Recent Labs    01/27/21 0159 01/28/21 0220  NA 144 140  K 3.3* 3.4*  CL 108 107  CO2 26 25  GLUCOSE 114* 105*  BUN 13 12  CREATININE 0.69 0.66  CALCIUM 8.4* 8.7*   PT/INR No results for input(s): LABPROT, INR in the last 72 hours. CMP     Component Value Date/Time   NA 140 01/28/2021 0220   NA 145 06/19/2018 0000   K 3.4 (L) 01/28/2021 0220   CL 107 01/28/2021 0220   CO2 25 01/28/2021 0220   GLUCOSE 105 (H) 01/28/2021 0220   BUN 12 01/28/2021 0220   BUN 7 06/19/2018 0000   CREATININE 0.66 01/28/2021 0220   CALCIUM 8.7 (L) 01/28/2021 0220   PROT 7.8 01/22/2021 0923   ALBUMIN 3.9 01/22/2021 0923   AST 22 01/22/2021 0923   ALT 16 01/22/2021 0923   ALKPHOS 55 01/22/2021 0923   BILITOT 0.4 01/22/2021 0923   GFRNONAA >60 01/28/2021 0220   GFRAA >60 12/09/2019 2243   Lipase     Component Value Date/Time   LIPASE 42 01/22/2021 0923       Studies/Results: No results found.  Anti-infectives: Anti-infectives (From  admission, onward)   Start     Dose/Rate Route Frequency Ordered Stop   01/23/21 1400  piperacillin-tazobactam (ZOSYN) IVPB 3.375 g        3.375 g 12.5 mL/hr over 240 Minutes Intravenous Every 8 hours 01/23/21 1244 01/27/21 2359      CT 3/24 Partial small bowel obstruction, with dilated fluid-filled loops of small bowel as well as stool sign within distended distal small bowel loops extending to the ileocecal valve.  Associated mesenteric edema within some of the fluid-filled small bowel loops in the RIGHT mid abdomen as well as mild wall thickening of the cecum.  Thickening of base of appendix though the remainder of the appendix appears normal.  Differential diagnosis would include stricture at the ileocecal valve with partial small bowel obstruction, inflammatory bowel disease involving the cecum and ileocecal valve with partial obstruction versus functional obstruction, or partial small bowel obstruction secondary to adhesions with associated edema and inflammation.  Findings do not appear to represent appendicitis and there is no evidence of perforation or abscess.  Assessment/Plan Diabetes mellitus type 2 Obesity Hypertension Rheumatoid arthritis on Plaquenil and methotrexate  SBO - Hx of hysterectomy, lap chole and umbilical hernia repair - CT scan on admission findings as noted above.GI is following for  inflammation at the cecum w/ ? Structure at the TI vs edema/inflammation. She is on steroids. Appreciate GI's assistance - Although she does have thickening at the base of the appendix on CT, radiology felt the remainder of the appendix appeared normal and did not represent appendicitis. She does not have focal tenderness of the RLQ and is NT on exam today with  -NGT removed yesterday. Having bowel function and tolerating cld without abdominal pain, n/v. She is NT on exam. Adv to FLD.  - Mobilize. PT rec HH - Keep K>4 and Mg>2 for bowel function  FEN - FLD.  IVF per TRH VTE -SCDs,Loveonx ID - Solumedrol. D/C Zosyn. W 11.1    LOS: 6 days    Jillyn Ledger , [facility] 01/28/2021, 8:54 AM Please see Amion for pager number during day hours 7:00am-4:30pm

## 2021-01-28 NOTE — Care Management Important Message (Signed)
Important Message  Patient Details  Name: Diane [name] MRN: [id number] Date of Birth: 02/06/1946   Medicare Important Message Given:  Yes     [name] 01/28/2021, 1:52 PM

## 2021-01-28 NOTE — Progress Notes (Signed)
Physical Therapy Treatment Patient Details Name: Diane [name] MRN: [id number] DOB: [birth year] Today's Date: 01/28/2021    History of Present Illness Pt is a 75 y/o female admitted 3/24 secondary to nausea and vomiting. Pt found to have small bowel obstruction and had NG tube placed. PMH includes DM, RA, lap chole, HTN, umbilical hernia repair.    PT Comments    Pt seated in recliner this session.  Pt reports pain in abdomen 5/10.  Pt performed increased gt distance followed by LE exercises.  Pt continues to benefit from HHPT at d/c.  Largest limiting factor is weakness in B LEs.  She was walking without a device before admission and heavily reliant on RW at this time.      Follow Up Recommendations  Home health PT;Supervision for mobility/OOB     Equipment Recommendations  Rolling walker with 5" wheels;3in1 (PT)    Recommendations for Other Services       Precautions / Restrictions Precautions Precautions: Fall Precaution Comments: NGT Restrictions Weight Bearing Restrictions: No    Mobility  Bed Mobility Overal bed mobility: Needs Assistance Bed Mobility: Supine to Sit     Supine to sit: Supervision     General bed mobility comments: In recliner upon entry    Transfers Overall transfer level: Needs assistance Equipment used: Rolling walker (2 wheeled) Transfers: Sit to/from Stand Sit to Stand: Min guard         General transfer comment: Cues for hand placement and forward weight shifting.  Slow and guarded to rise into standing.  Ambulation/Gait Ambulation/Gait assistance: Min guard Gait Distance (Feet): 120 Feet Assistive device: Rolling walker (2 wheeled) Gait Pattern/deviations: Step-through pattern;Decreased stride length;Wide base of support Gait velocity: Decreased   General Gait Details: Very slow, guarded gait. Min guard for safety. Pt with increased fatigue and reports feet feel heavy.   Stairs             Wheelchair Mobility     Modified Rankin (Stroke Patients Only)       Balance Overall balance assessment: Needs assistance Sitting-balance support: No upper extremity supported;Feet supported Sitting balance-Leahy Scale: Good     Standing balance support: During functional activity Standing balance-Leahy Scale: Poor Standing balance comment: Pt stood at sink using RW for grooming, UB and LB bathing                            Cognition Arousal/Alertness: Awake/alert Behavior During Therapy: WFL for tasks assessed/performed Overall Cognitive Status: Within Functional Limits for tasks assessed                                        Exercises General Exercises - Lower Extremity Ankle Circles/Pumps: AROM;Both;15 reps;Seated Quad Sets: AROM;Both;10 reps;Supine (reclined) Long Arc Quad: AROM;Both;10 reps;Seated Hip Flexion/Marching: AROM;Both;10 reps;Seated    General Comments        Pertinent Vitals/Pain Pain Assessment: Faces Faces Pain Scale: Hurts a little bit Pain Location: abdomen Pain Descriptors / Indicators: Sore Pain Intervention(s): Monitored during session;Repositioned    Home Living                      Prior Function            PT Goals (current goals can now be found in the care plan section) Acute Rehab PT Goals Patient Stated Goal:  to go home Potential to Achieve Goals: Good Progress towards PT goals: Progressing toward goals    Frequency    Min 3X/week      PT Plan Current plan remains appropriate    Co-evaluation              AM-PAC PT "6 Clicks" Mobility   Outcome Measure  Help needed turning from your back to your side while in a flat bed without using bedrails?: None Help needed moving from lying on your back to sitting on the side of a flat bed without using bedrails?: A Little Help needed moving to and from a bed to a chair (including a wheelchair)?: A Little Help needed standing up from a chair using your  arms (e.g., wheelchair or bedside chair)?: A Little Help needed to walk in hospital room?: A Little Help needed climbing 3-5 steps with a railing? : A Little 6 Click Score: 19    End of Session Equipment Utilized During Treatment: Gait belt Activity Tolerance: Patient limited by fatigue Patient left: in chair;with call bell/phone within reach;with family/visitor present;with nursing/sitter in room Nurse Communication: Mobility status PT Visit Diagnosis: Unsteadiness on feet (R26.81);Muscle weakness (generalized) (M62.81)     Time: 2233-6122 PT Time Calculation (min) (ACUTE ONLY): 23 min  Charges:  $Gait Training: 8-22 mins $Therapeutic Exercise: 8-22 mins                     Erasmo Leventhal , PTA Acute Rehabilitation Services Pager [phone] Office [phone]     Jerimiah Wolman Eli Hose 01/28/2021, 3:13 PM

## 2021-01-28 NOTE — Progress Notes (Signed)
Subjective: Since I last evaluated the patient, she seems to be doing much better today she had a bowel movement today and is passing flatus as well.  She is tolerating a clear liquid diet which is going to be advanced as tolerated.  Objective: Vital signs in last 24 hours: Temp:  [98 F (36.7 C)-98.3 F (36.8 C)] 98 F (36.7 C) (03/30 0437) Pulse Rate:  [72-78] 78 (03/30 1344) Resp:  [16-17] 17 (03/30 0437) BP: (160-174)/(76-83) 160/81 (03/30 1344) SpO2:  [94 %-99 %] 99 % (03/30 1344) Last BM Date: 01/25/21  Intake/Output from previous day: 03/29 0701 - 03/30 0700 In: 50 [P.O.:50] Out: 400 [Urine:400] Intake/Output this shift: No intake/output data recorded.  General appearance: alert, cooperative, appears stated age, fatigued, no distress and moderately obese Resp: clear to auscultation bilaterally Cardio: regular rate and rhythm, S1, S2 normal, no murmur, click, rub or gallop GI: soft, lightly tender on deep palpation with hypoactive bowel sounds; no masses,  no organomegaly Extremities: extremities normal, atraumatic, no cyanosis or edema  Lab Results: Recent Labs    01/26/21 0842 01/27/21 0159 01/28/21 0220  WBC 11.6* 11.3* 11.1*  HGB 14.9 13.9 13.2  HCT 44.3 42.7 40.9  PLT 206 210 212   BMET Recent Labs    01/26/21 0842 01/27/21 0159 01/28/21 0220  NA 141 144 140  K 3.2* 3.3* 3.4*  CL 108 108 107  CO2 18* 26 25  GLUCOSE 186* 114* 105*  BUN 11 13 12   CREATININE 0.63 0.69 0.66  CALCIUM 8.3* 8.4* 8.7*    Studies/Results: No results found.  Medications: I have reviewed the patient's current medications.  Assessment/Plan: 1) Small bowel obstruction improving slowly after treatment with Solu-Medrol-?  Cecal inflammation on CT scan.  Advance diet as tolerated.  2) AODM poorly controlled. 3) HTN. 4) Rheumatoid arthritis  LOS: 6 days   Diane [name] 01/28/2021, 2:58 PM

## 2021-01-28 NOTE — Progress Notes (Signed)
PROGRESS NOTE    [name]  JGG:[id number] DOB: 1946/05/08 DOA: 01/22/2021 PCP: [name], MD     Brief Narrative:  Diane [name] is a 75 y.o. female with medical history significant of HTN, IDDM, rheumatoid arthritis, anxiety, depression, mild cognitive deficit who presented with new onset of abdominal pain, nausea, vomiting. Her symptoms started last night after dinner.  Abdominal pain vaguely localized on the right aspect near umbilicus, pain has been cramping intermittent but becomes more constant this morning. Abdominal x-ray showed small bowel perforation, CT abdomen showed dilated bowels, suspicious for ileus cecal wall stricture.  General surgery as well as GI were consulted.  New events last 24 hours / Subjective: No vomiting, tolerating full liquids, had a bowel movement today  Assessment & Plan:   Principal Problem:   SBO (small bowel obstruction) (HCC) Active Problems:   Prolonged Q-T interval on ECG   Type 2 diabetes mellitus with diabetic neuropathy, unspecified (HCC)   Peripheral neuropathy   AKI (acute kidney injury) ([place])   Rheumatoid arthritis (HCC)   pSBO -CT A/P: Partial small bowel obstruction, with dilated fluid-filled loops of small bowel as well as stool sign within distended distal small bowel loops extending to the ileocecal valve. Associated mesenteric edema within some of the fluid-filled small bowel loops in the RIGHT mid abdomen as well as mild wall thickening of the cecum. Thickening of base of appendix though the remainder of the appendix appears normal. Findings do not appear to represent appendicitis and there is no evidence of perforation or abscess. -General surgery, GI following -Trial of Solu-Medrol due to possibility of inflammatory component -Slowly improving -currently on full liquid diet, advance as tolerated  SIRS, sepsis ruled out -Patient was afebrile, HR 81, RR 18, WBC 8.6 at the time of admission.  This morning, patient  remains tachycardic with leukocytosis 14.9 and lactic acidosis 4.2 with AKI -Covid, influenza negative -UA negative, urine culture negative -Chest x-ray unremarkable -Blood cultures negative to date -IV zosyn discontinued 3/30 -Leukocytosis initially resolved, now elevated but could be due to steroid use. Monitor   Lactic acidosis -likely related to dehydration and metformin use -resolved with IV fluids  Hypernatremia -Resolved with D5-0.45NS   Acute hypoxemic respiratory failure -Patient with SPO2 89% on room air, currently on 2 L nasal cannula O2, wean as able -Chest x-ray unremarkable -resolved  AKI -Presented with creatinine 0.6, up to 1.27  -Renal ultrasound unremarkable -Resolved  Diabetes mellitus, insulin-dependent, poorly controlled with hyperglycemia -Hemoglobin A1c 7.3 -Hold home Metformin -resume gabapentin -Continue Lantus, continue sliding scale insulin  Hypertension -resume home medication including Norvasc  Rheumatoid arthritis -Hold home Plaquenil, Norco, methotrexate  Anxiety/depression -Hold home Cymbalta  Mild cognitive deficit -Per daughter, patient has had some memory loss over the years, possibly early beginning of dementia.  Patient does live at home alone and is independent of ADLs at baseline  Prolonged QTc -Repeat EKG reviewed independently reveals QTC 500  Hypokalemia -Replace, trend  -Mg normal   DVT prophylaxis:  enoxaparin (LOVENOX) injection 40 mg Start: 01/23/21 1000  Code Status: Full code Family Communication: No family at bedside Disposition Plan:  Status is: Inpatient  Remains inpatient appropriate because:IV treatments appropriate due to intensity of illness or inability to take PO and Inpatient level of care appropriate due to severity of illness   Dispo: The patient is from: Home              Anticipated d/c is to: Home  Patient currently is not medically stable to d/c.  Continue treatment for SBO,  continue IV fluid and IV Solu-Medrol and IV antibiotics.    Difficult to place patient No      Consultants:  General surgery GI  Antimicrobials:  Anti-infectives (From admission, onward)   Start     Dose/Rate Route Frequency Ordered Stop   01/23/21 1400  piperacillin-tazobactam (ZOSYN) IVPB 3.375 g        3.375 g 12.5 mL/hr over 240 Minutes Intravenous Every 8 hours 01/23/21 1244 01/27/21 2359       Objective: Vitals:   01/27/21 1200 01/27/21 1305 01/27/21 1950 01/28/21 0437  BP: (!) 163/76 (!) 167/86 (!) 174/83 (!) 162/76  Pulse:  76 73 72  Resp:  16 16 17   Temp:  98.5 F (36.9 C) 98.3 F (36.8 C) 98 F (36.7 C)  TempSrc:  Oral Oral   SpO2:  98% 98% 94%  Weight:      Height:        Intake/Output Summary (Last 24 hours) at 01/28/2021 1301 Last data filed at 01/27/2021 1950 Gross per 24 hour  Intake 50 ml  Output 400 ml  Net -350 ml   Filed Weights   01/22/21 0847  Weight: 98.4 kg    Examination: General exam: Alert, awake, oriented x 3 Respiratory system: Clear to auscultation. Respiratory effort normal. Cardiovascular system:RRR. No murmurs, rubs, gallops. Gastrointestinal system: Abdomen is nondistended, soft and tender on right side. No organomegaly or masses felt. Normal bowel sounds heard. Central nervous system: Alert and oriented. No focal neurological deficits. Extremities: No C/C/E, +pedal pulses Skin: No rashes, lesions or ulcers Psychiatry: Judgement and insight appear normal. Mood & affect appropriate.     Data Reviewed: I have personally reviewed following labs and imaging studies  CBC: Recent Labs  Lab 01/22/21 0923 01/23/21 0059 01/24/21 0321 01/25/21 0153 01/26/21 0842 01/27/21 0159 01/28/21 0220  WBC 8.6   < > 7.1 6.4 11.6* 11.3* 11.1*  NEUTROABS 7.2  --   --   --   --   --   --   HGB 14.3   < > 14.9 14.9 14.9 13.9 13.2  HCT 44.1   < > 45.2 46.9* 44.3 42.7 40.9  MCV 90.6   < > 88.3 92.5 88.2 90.5 92.1  PLT 232   < > 252 156  206 210 212   < > = values in this interval not displayed.   Basic Metabolic Panel: Recent Labs  Lab 01/24/21 0321 01/25/21 0153 01/26/21 0842 01/27/21 0159 01/28/21 0220  NA 140 147* 141 144 140  K 3.8 4.0 3.2* 3.3* 3.4*  CL 105 114* 108 108 107  CO2 23 25 18* 26 25  GLUCOSE 224* 133* 186* 114* 105*  BUN 48* 26* 11 13 12   CREATININE 1.27* 0.68 0.63 0.69 0.66  CALCIUM 8.7* 8.5* 8.3* 8.4* 8.7*  MG  --   --   --  1.9 1.9   GFR: Estimated Creatinine Clearance: 75.7 mL/min (by C-G formula based on SCr of 0.66 mg/dL). Liver Function Tests: Recent Labs  Lab 01/22/21 0923  AST 22  ALT 16  ALKPHOS 55  BILITOT 0.4  PROT 7.8  ALBUMIN 3.9   Recent Labs  Lab 01/22/21 0923  LIPASE 42   No results for input(s): AMMONIA in the last 168 hours. Coagulation Profile: No results for input(s): INR, PROTIME in the last 168 hours. Cardiac Enzymes: No results for input(s): CKTOTAL, CKMB, CKMBINDEX, TROPONINI in the  last 168 hours. BNP (last 3 results) No results for input(s): PROBNP in the last 8760 hours. HbA1C: No results for input(s): HGBA1C in the last 72 hours. CBG: Recent Labs  Lab 01/27/21 1823 01/28/21 0021 01/28/21 0403 01/28/21 0641 01/28/21 1155  GLUCAP 182* 119* 125* 125* 243*   Lipid Profile: No results for input(s): CHOL, HDL, LDLCALC, TRIG, CHOLHDL, LDLDIRECT in the last 72 hours. Thyroid Function Tests: No results for input(s): TSH, T4TOTAL, FREET4, T3FREE, THYROIDAB in the last 72 hours. Anemia Panel: No results for input(s): VITAMINB12, FOLATE, FERRITIN, TIBC, IRON, RETICCTPCT in the last 72 hours. Sepsis Labs: Recent Labs  Lab 01/23/21 0059 01/23/21 1335 01/23/21 1606  LATICACIDVEN 4.2* 3.5* 2.8*    Recent Results (from the past 240 hour(s))  Resp Panel by RT-PCR (Flu A&B, Covid) Nasopharyngeal Swab     Status: None   Collection Time: 01/22/21  3:40 PM   Specimen: Nasopharyngeal Swab; Nasopharyngeal(NP) swabs in vial transport medium  Result  Value Ref Range Status   SARS Coronavirus 2 by RT PCR NEGATIVE NEGATIVE Final    Comment: (NOTE) SARS-CoV-2 target nucleic acids are NOT DETECTED.  The SARS-CoV-2 RNA is generally detectable in upper respiratory specimens during the acute phase of infection. The lowest concentration of SARS-CoV-2 viral copies this assay can detect is 138 copies/mL. A negative result does not preclude SARS-Cov-2 infection and should not be used as the sole basis for treatment or other patient management decisions. A negative result may occur with  improper specimen collection/handling, submission of specimen other than nasopharyngeal swab, presence of viral mutation(s) within the areas targeted by this assay, and inadequate number of viral copies(<138 copies/mL). A negative result must be combined with clinical observations, patient history, and epidemiological information. The expected result is Negative.  Fact Sheet for Patients:  EntrepreneurPulse.com.au  Fact Sheet for Healthcare Providers:  IncredibleEmployment.be  This test is no t yet approved or cleared by the [place] FDA and  has been authorized for detection and/or diagnosis of SARS-CoV-2 by FDA under an Emergency Use Authorization (EUA). This EUA will remain  in effect (meaning this test can be used) for the duration of the COVID-19 declaration under Section 564(b)(1) of the Act, 21 U.S.C.section 360bbb-3(b)(1), unless the authorization is terminated  or revoked sooner.       Influenza A by PCR NEGATIVE NEGATIVE Final   Influenza B by PCR NEGATIVE NEGATIVE Final    Comment: (NOTE) The Xpert Xpress SARS-CoV-2/FLU/RSV plus assay is intended as an aid in the diagnosis of influenza from Nasopharyngeal swab specimens and should not be used as a sole basis for treatment. Nasal washings and aspirates are unacceptable for Xpert Xpress SARS-CoV-2/FLU/RSV testing.  Fact Sheet for  Patients: EntrepreneurPulse.com.au  Fact Sheet for Healthcare Providers: IncredibleEmployment.be  This test is not yet approved or cleared by the [place] FDA and has been authorized for detection and/or diagnosis of SARS-CoV-2 by FDA under an Emergency Use Authorization (EUA). This EUA will remain in effect (meaning this test can be used) for the duration of the COVID-19 declaration under Section 564(b)(1) of the Act, 21 U.S.C. section 360bbb-3(b)(1), unless the authorization is terminated or revoked.  Performed at [facility], [address] [place], [place] [address]   Culture, blood (routine x 2)     Status: None   Collection Time: 01/23/21  1:20 PM   Specimen: BLOOD  Result Value Ref Range Status   Specimen Description BLOOD RIGHT ANTECUBITAL  Final   Special  Requests   Final    BOTTLES DRAWN AEROBIC ONLY Blood Culture adequate volume   Culture   Final    NO GROWTH 5 DAYS Performed at [facility], [place] [address], [place], [place] [address]    Report Status 01/28/2021 FINAL  Final  Culture, blood (routine x 2)     Status: None   Collection Time: 01/23/21  1:30 PM   Specimen: BLOOD RIGHT HAND  Result Value Ref Range Status   Specimen Description BLOOD RIGHT HAND  Final   Special Requests   Final    BOTTLES DRAWN AEROBIC ONLY Blood Culture adequate volume   Culture   Final    NO GROWTH 5 DAYS Performed at [facility], [place] [address] [place],  [address]    Report Status 01/28/2021 FINAL  Final  Culture, Urine     Status: None   Collection Time: 01/24/21  8:05 AM   Specimen: Urine, Random  Result Value Ref Range Status   Specimen Description URINE, RANDOM  Final   Special Requests NONE  Final   Culture   Final    NO GROWTH Performed at [facility], [place] [address], [place],  [address]    Report Status 01/25/2021 FINAL  Final      Radiology Studies: No results  found.    Scheduled Meds: . enoxaparin (LOVENOX) injection  40 mg Subcutaneous Q24H  . insulin aspart  0-15 Units Subcutaneous Q6H  . insulin glargine  15 Units Subcutaneous QHS  . methylPREDNISolone (SOLU-MEDROL) injection  40 mg Intravenous Daily   Continuous Infusions:    LOS: 6 days      Time spent: 30 minutes   Kathie Dike, MD [facility] 01/28/2021, 1:01 PM   Available via Epic secure chat 7am-7pm After these hours, please refer to coverage provider listed on [email or web address]

## 2021-01-28 NOTE — TOC Progression Note (Addendum)
Transition of Care [facility]) - Progression Note    Patient Details  Name: SANAZ SCARLETT MRN: [id number] Date of Birth: 1946-04-07  Transition of Care [facility] CM/SW Contact  Jacalyn Lefevre Edson Snowball, RN Phone Number: 01/28/2021, 9:36 AM  Clinical Narrative:     In progression nurse reported patient going home today.   Ordered walker and 3 in1 through [place] with Roselle with [facility] checking to see if she can accept referral.   Amy with [facility] accepted referral for HHPT and OT will need orders and face to face   Expected Discharge Plan: [place] Barriers to Discharge: Continued Medical Work up  Expected Discharge Plan and Services Expected Discharge Plan: [place]   Discharge Planning Services: CM Consult Post Acute Care Choice: [place] arrangements for the past 2 months: Single Family Home                 DME Arranged: 3-N-1,Walker rolling DME Agency: [employer]       HH Arranged: PT,Nurse's Aide           Social Determinants of Health (SDOH) Interventions    Readmission Risk Interventions No flowsheet data found.

## 2021-01-28 NOTE — Progress Notes (Signed)
Occupational Therapy Treatment Patient Details Name: Diane [name] MRN: [id number] DOB: 07-14-1946 Today's Date: 01/28/2021    History of present illness Pt is a 75 y/o female admitted 3/24 secondary to nausea and vomiting. Pt found to have small bowel obstruction and had NG tube placed. PMH includes DM, RA, lap chole, HTN, umbilical hernia repair.   OT comments  Pt making good progress with functional goals. Pt ambulated to bathroom with RW and stood at sink for grooming, bathing and dressing min guard A. Pt hopeful to go home tomorrow. Attempted A/E education and pt politely declined. OT will continue to follow acutely to maximize level of function and safety  Follow Up Recommendations  Home health OT;Supervision/Assistance - 24 hour    Equipment Recommendations  None recommended by OT    Recommendations for Other Services      Precautions / Restrictions Precautions Precautions: Fall Restrictions Weight Bearing Restrictions: No       Mobility Bed Mobility Overal bed mobility: Needs Assistance Bed Mobility: Supine to Sit     Supine to sit: Supervision          Transfers Overall transfer level: Needs assistance Equipment used: Rolling walker (2 wheeled) Transfers: Sit to/from Stand Sit to Stand: Min assist;Min guard         General transfer comment: Cues for safe hand placement.    Balance Overall balance assessment: Needs assistance Sitting-balance support: No upper extremity supported;Feet supported Sitting balance-Leahy Scale: Good     Standing balance support: During functional activity Standing balance-Leahy Scale: Poor Standing balance comment: Pt stood at sink using RW for grooming, UB and LB bathing                           ADL either performed or assessed with clinical judgement   ADL Overall ADL's : Needs assistance/impaired     Grooming: Wash/dry face;Wash/dry hands;Min guard;Sitting;Oral care   Upper Body Bathing: Min  guard;Standing   Lower Body Bathing: Min guard;Minimal assistance;Sit to/from stand Lower Body Bathing Details (indicate cue type and reason): min A with feet, stood at sink with RW Upper Body Dressing : Min guard;Standing   Lower Body Dressing: Minimal assistance;Sit to/from stand   Toilet Transfer: Min guard;Ambulation;RW;Comfort height toilet;Grab bars;Cueing for safety   Toileting- Clothing Manipulation and Hygiene: Min guard;Sit to/from stand       Functional mobility during ADLs: Minimal assistance;Min guard;Rolling walker;Cueing for safety       Vision Patient Visual Report: No change from baseline     Perception     Praxis      Cognition Arousal/Alertness: Awake/alert Behavior During Therapy: WFL for tasks assessed/performed Overall Cognitive Status: Within Functional Limits for tasks assessed                                          Exercises     Shoulder Instructions       General Comments      Pertinent Vitals/ Pain       Pain Assessment: Faces Faces Pain Scale: Hurts a little bit Pain Location: abdomen Pain Descriptors / Indicators: Sore Pain Intervention(s): Monitored during session;Repositioned  Home Living  Prior Functioning/Environment              Frequency  Min 2X/week        Progress Toward Goals  OT Goals(current goals can now be found in the care plan section)  Progress towards OT goals: Progressing toward goals  Acute Rehab OT Goals Patient Stated Goal: to go home  Plan Discharge plan remains appropriate    Co-evaluation                 AM-PAC OT "6 Clicks" Daily Activity     Outcome Measure   Help from another person eating meals?: None Help from another person taking care of personal grooming?: A Little Help from another person toileting, which includes using toliet, bedpan, or urinal?: A Little Help from another person bathing  (including washing, rinsing, drying)?: A Little Help from another person to put on and taking off regular upper body clothing?: A Little Help from another person to put on and taking off regular lower body clothing?: A Little 6 Click Score: 19    End of Session Equipment Utilized During Treatment: Rolling walker;Gait belt  OT Visit Diagnosis: Unsteadiness on feet (R26.81);Muscle weakness (generalized) (M62.81);Pain Pain - part of body:  (abdomen)   Activity Tolerance Patient tolerated treatment well   Patient Left in chair;with call bell/phone within reach   Nurse Communication          Time: 1696-7893 OT Time Calculation (min): 44 min  Charges: OT General Charges $OT Visit: 1 Visit OT Treatments $Self Care/Home Management : 23-37 mins $Therapeutic Activity: 8-22 mins    Britt Bottom 01/28/2021, 2:31 PM

## 2021-01-29 LAB — GLUCOSE, CAPILLARY
Glucose-Capillary: 128 mg/dL — ABNORMAL HIGH (ref 70–99)
Glucose-Capillary: 215 mg/dL — ABNORMAL HIGH (ref 70–99)
Glucose-Capillary: 307 mg/dL — ABNORMAL HIGH (ref 70–99)
Glucose-Capillary: 99 mg/dL (ref 70–99)

## 2021-01-29 MED ORDER — PREDNISONE 20 MG PO TABS: 40.0000 mg | ORAL_TABLET | Freq: Every day | ORAL | Status: DC

## 2021-01-29 MED ORDER — PREDNISONE 20 MG PO TABS
40.0000 mg | ORAL_TABLET | Freq: Every day | ORAL | Status: DC
  Administered 2021-01-30: 40 mg via ORAL
  Filled 2021-01-29: qty 2

## 2021-01-29 MED ORDER — PREDNISONE 10 MG PO TABS: 40.0000 mg | ORAL_TABLET | Freq: Every day | ORAL | 0 refills | Status: DC

## 2021-01-29 NOTE — Progress Notes (Signed)
Subjective: CC: Patient reports she is doing well.  She reports some mild upper abdominal discomfort yesterday but no pain today.  Denies nausea or vomiting and tolerating full liquids.  Passing flatus.  2 semisolid BMs yesterday.  Objective: Vital signs in last 24 hours: Temp:  [98.2 F (36.8 C)-99.2 F (37.3 C)] 98.7 F (37.1 C) (03/31 0502) Pulse Rate:  [73-82] 73 (03/31 0502) Resp:  [18] 18 (03/31 0502) BP: (153-171)/(81-92) 171/89 (03/31 0502) SpO2:  [96 %-99 %] 96 % (03/31 0502) Last BM Date: 01/28/21  Intake/Output from previous day: 03/30 0701 - 03/31 0700 In: 1200 [P.O.:1200] Out: -  Intake/Output this shift: No intake/output data recorded.  PE: Gen: Alert, NAD, pleasant Card: Reg Abd: Soft, mild distension,no tenderness on exam and withoutperitonitis, +BS,  Psych: A&Ox3  Skin: no rashes noted, warm and dry  Lab Results:  Recent Labs    01/27/21 0159 01/28/21 0220  WBC 11.3* 11.1*  HGB 13.9 13.2  HCT 42.7 40.9  PLT 210 212   BMET Recent Labs    01/27/21 0159 01/28/21 0220  NA 144 140  K 3.3* 3.4*  CL 108 107  CO2 26 25  GLUCOSE 114* 105*  BUN 13 12  CREATININE 0.69 0.66  CALCIUM 8.4* 8.7*   PT/INR No results for input(s): LABPROT, INR in the last 72 hours. CMP     Component Value Date/Time   NA 140 01/28/2021 0220   NA 145 06/19/2018 0000   K 3.4 (L) 01/28/2021 0220   CL 107 01/28/2021 0220   CO2 25 01/28/2021 0220   GLUCOSE 105 (H) 01/28/2021 0220   BUN 12 01/28/2021 0220   BUN 7 06/19/2018 0000   CREATININE 0.66 01/28/2021 0220   CALCIUM 8.7 (L) 01/28/2021 0220   PROT 7.8 01/22/2021 0923   ALBUMIN 3.9 01/22/2021 0923   AST 22 01/22/2021 0923   ALT 16 01/22/2021 0923   ALKPHOS 55 01/22/2021 0923   BILITOT 0.4 01/22/2021 0923   GFRNONAA >60 01/28/2021 0220   GFRAA >60 12/09/2019 2243   Lipase     Component Value Date/Time   LIPASE 42 01/22/2021 0923       Studies/Results: No results  found.  Anti-infectives: Anti-infectives (From admission, onward)   Start     Dose/Rate Route Frequency Ordered Stop   01/23/21 1400  piperacillin-tazobactam (ZOSYN) IVPB 3.375 g        3.375 g 12.5 mL/hr over 240 Minutes Intravenous Every 8 hours 01/23/21 1244 01/27/21 2359      CT 3/24 Partial small bowel obstruction, with dilated fluid-filled loops of small bowel as well as stool sign within distended distal small bowel loops extending to the ileocecal valve.  Associated mesenteric edema within some of the fluid-filled small bowel loops in the RIGHT mid abdomen as well as mild wall thickening of the cecum.  Thickening of base of appendix though the remainder of the appendix appears normal.  Differential diagnosis would include stricture at the ileocecal valve with partial small bowel obstruction, inflammatory bowel disease involving the cecum and ileocecal valve with partial obstruction versus functional obstruction, or partial small bowel obstruction secondary to adhesions with associated edema and inflammation.  Findings do not appear to represent appendicitis and there is no evidence of perforation or abscess.  Assessment/Plan Diabetes mellitus type 2 Obesity Hypertension Rheumatoid arthritis on Plaquenil and methotrexate  SBO - Hx of hysterectomy, lap chole and umbilical hernia repair - CT scanon admissionfindings as noted above.GI is  following for inflammation at the cecum w/ ? Structure at the TI vs edema/inflammation. She is on steroids. Appreciate GI's assistance - Although she does have thickening at the base of the appendix on CT, radiology felt the remainder of the appendix appeared normal and did not represent appendicitis. She does not have focal tenderness of the RLQand is NT on exam today with - Mobilize. PT rec [facility] -Patient has tolerating full liquid diet and having bowel function.  Her symptoms have resolved and she is nontender on exam.  Will  advance diet and sign off.  Patient will need follow-up with her GI doctor for further evaluation.  Please call back if we can be of any assistance.  FEN - Soft. IVF per [facility] VTE -SCDs,Loveonx ID - Solumedrol > now predisone. Zosyn 3/25 - 3/29  LOS: 7 days    [facility] 01/29/2021, 9:10 AM Please see Amion for pager number during day hours 7:00am-4:30pm

## 2021-01-29 NOTE — Progress Notes (Signed)
Subjective: Feeling well.  Tolerated clear liquids.  Passing flatus.  Objective: Vital signs in last 24 hours: Temp:  [98.2 F (36.8 C)-99.2 F (37.3 C)] 98.7 F (37.1 C) (03/31 0502) Pulse Rate:  [73-82] 73 (03/31 0502) Resp:  [18] 18 (03/31 0502) BP: (153-171)/(81-92) 171/89 (03/31 0502) SpO2:  [96 %-99 %] 96 % (03/31 0502) Last BM Date: 01/28/21  Intake/Output from previous day: 03/30 0701 - 03/31 0700 In: 1200 [P.O.:1200] Out: -  Intake/Output this shift: No intake/output data recorded.  General appearance: alert and no distress Resp: clear to auscultation bilaterally Cardio: regular rate and rhythm GI: soft, tympanic, hypoactive bowel sounds, but it appears to be more active today Extremities: extremities normal, atraumatic, no cyanosis or edema  Lab Results: Recent Labs    01/26/21 0842 01/27/21 0159 01/28/21 0220  WBC 11.6* 11.3* 11.1*  HGB 14.9 13.9 13.2  HCT 44.3 42.7 40.9  PLT 206 210 212   BMET Recent Labs    01/26/21 0842 01/27/21 0159 01/28/21 0220  NA 141 144 140  K 3.2* 3.3* 3.4*  CL 108 108 107  CO2 18* 26 25  GLUCOSE 186* 114* 105*  BUN 11 13 12   CREATININE 0.63 0.69 0.66  CALCIUM 8.3* 8.4* 8.7*   LFT No results for input(s): PROT, ALBUMIN, AST, ALT, ALKPHOS, BILITOT, BILIDIR, IBILI in the last 72 hours. PT/INR No results for input(s): LABPROT, INR in the last 72 hours. Hepatitis Panel No results for input(s): HEPBSAG, HCVAB, HEPAIGM, HEPBIGM in the last 72 hours. C-Diff No results for input(s): CDIFFTOX in the last 72 hours. Fecal Lactopherrin No results for input(s): FECLLACTOFRN in the last 72 hours.  Studies/Results: No results found.  Medications:  Scheduled: . amLODipine  5 mg Oral Daily  . enoxaparin (LOVENOX) injection  40 mg Subcutaneous Q24H  . gabapentin  300 mg Oral BID  . insulin aspart  0-15 Units Subcutaneous Q6H  . insulin glargine  15 Units Subcutaneous QHS  . [START ON 01/30/2021] predniSONE  40 mg Oral Q  breakfast  . rosuvastatin  10 mg Oral Daily   Continuous:   Assessment/Plan: 1) SBO - ? Etiology - stricture versus inflammation. 2) DM.   She is clinically improved.  The NG tube was removed and she is feeling better.  She is still very tympanic.  From the GI standpoint the TI can be evaluated as an outpatient.  Plan: 1) Transition to oral steroids.  Prednisone 40 mg QD. 2) Upon discharge follow up with Dr. Collene Mares, primary GI. 3) Signing off.  Call with any questions.   LOS: 7 days   Markevius Trombetta D 01/29/2021, 8:24 AM

## 2021-01-29 NOTE — Progress Notes (Signed)
Patient anticipated to be discharged today; due to multiple incontinent episodes, she says she does not feel "confident in herself or comfortable going home today". Roderic Palau, MD notified. Will check back in with patient after dinner per MD request.

## 2021-01-29 NOTE — Progress Notes (Signed)
Inpatient Diabetes Program Recommendations  AACE/ADA: New Consensus Statement on Inpatient Glycemic Control (2015)  Target Ranges:  Prepandial:   less than 140 mg/dL      Peak postprandial:   less than 180 mg/dL (1-2 hours)      Critically ill patients:  140 - 180 mg/dL   Lab Results  Component Value Date   GLUCAP 307 (H) 01/29/2021   HGBA1C 7.3 (H) 01/22/2021    Review of Glycemic Control Results for Diane [name], Diane [name] (MRN [id number]) as of 01/29/2021 13:22  Ref. Range 01/28/2021 06:41 01/28/2021 11:55 01/28/2021 18:28 01/29/2021 00:55 01/29/2021 07:02 01/29/2021 11:53  Glucose-Capillary Latest Ref Range: 70 - 99 mg/dL 125 (H) 243 (H) 192 (H) 128 (H) 99 307 (H)   Diabetes history: DM 2 Outpatient Diabetes medications: metformin 500 mg Daily Current orders for Inpatient glycemic control:  Lantus 15 units qhs Novolog 0-15 units Q6 hours  PO prednisone 40 mg Daily  Inpatient Diabetes Program Recommendations:    -  Consider adding Novolog 2-3 units tid meal coverage if eating >50% of meals.  Thanks,  Tama Headings RN, MSN, BC-ADM Inpatient Diabetes Coordinator Team Pager [phone] (8a-5p)

## 2021-01-29 NOTE — Progress Notes (Signed)
PROGRESS NOTE    [name]  CLE:[id number] DOB: 09-10-1946 DOA: 01/22/2021 PCP: [name], MD     Brief Narrative:  Diane [name] is a 75 y.o. female with medical history significant of HTN, IDDM, rheumatoid arthritis, anxiety, depression, mild cognitive deficit who presented with new onset of abdominal pain, nausea, vomiting. Her symptoms started last night after dinner.  Abdominal pain vaguely localized on the right aspect near umbilicus, pain has been cramping intermittent but becomes more constant this morning. Abdominal x-ray showed small bowel perforation, CT abdomen showed dilated bowels, suspicious for ileus cecal wall stricture.  General surgery as well as GI were consulted.  New events last 24 hours / Subjective: She was tolerating liquids without any issues but began to develop abdominal cramping  Assessment & Plan:   Principal Problem:   SBO (small bowel obstruction) (HCC) Active Problems:   Prolonged Q-T interval on ECG   Type 2 diabetes mellitus with diabetic neuropathy, unspecified (HCC)   Peripheral neuropathy   AKI (acute kidney injury) ([place])   Rheumatoid arthritis (HCC)   pSBO -CT A/P: Partial small bowel obstruction, with dilated fluid-filled loops of small bowel as well as stool sign within distended distal small bowel loops extending to the ileocecal valve. Associated mesenteric edema within some of the fluid-filled small bowel loops in the RIGHT mid abdomen as well as mild wall thickening of the cecum. Thickening of base of appendix though the remainder of the appendix appears normal. Findings do not appear to represent appendicitis and there is no evidence of perforation or abscess. -General surgery, GI following -Trial of Solu-Medrol due to possibility of inflammatory component which has since been transitioned to prednisone taper -Slowly improving -currently on soft diet -having frequent stools today and does not feel as though she can manage at  home -will monitor overnight  SIRS, sepsis ruled out -Patient was afebrile, HR 81, RR 18, WBC 8.6 at the time of admission.  This morning, patient remains tachycardic with leukocytosis 14.9 and lactic acidosis 4.2 with AKI -Covid, influenza negative -UA negative, urine culture negative -Chest x-ray unremarkable -Blood cultures negative to date -IV zosyn discontinued 3/30 -Leukocytosis initially resolved, now elevated but could be due to steroid use. Monitor   Lactic acidosis -likely related to dehydration and metformin use -resolved with IV fluids  Hypernatremia -Resolved with D5-0.45NS   Acute hypoxemic respiratory failure -Patient with SPO2 89% on room air, currently on 2 L nasal cannula O2, wean as able -Chest x-ray unremarkable -resolved  AKI -Presented with creatinine 0.6, up to 1.27  -Renal ultrasound unremarkable -Resolved  Diabetes mellitus, insulin-dependent, poorly controlled with hyperglycemia -Hemoglobin A1c 7.3 -Hold home Metformin -resume gabapentin -Continue Lantus, continue sliding scale insulin  Hypertension -resume home medication including Norvasc  Rheumatoid arthritis -Hold home Plaquenil, Norco, methotrexate  Anxiety/depression -Hold home Cymbalta  Mild cognitive deficit -Per daughter, patient has had some memory loss over the years, possibly early beginning of dementia.  Patient does live at home alone and is independent of ADLs at baseline  Prolonged QTc -Repeat EKG reviewed independently reveals QTC 500  Hypokalemia -Replace, trend  -Mg normal   DVT prophylaxis:  enoxaparin (LOVENOX) injection 40 mg Start: 01/23/21 1000  Code Status: Full code Family Communication: No family at bedside Disposition Plan:  Status is: Inpatient  Remains inpatient appropriate because:IV treatments appropriate due to intensity of illness or inability to take PO and Inpatient level of care appropriate due to severity of illness   Dispo: The patient  is from: Home              Anticipated d/c is to: Home              Patient currently is not medically stable to d/c.  Continue treatment for SBO, continue IV fluid and IV Solu-Medrol and IV antibiotics.    Difficult to place patient No      Consultants:  General surgery GI  Antimicrobials:  Anti-infectives (From admission, onward)   Start     Dose/Rate Route Frequency Ordered Stop   01/23/21 1400  piperacillin-tazobactam (ZOSYN) IVPB 3.375 g        3.375 g 12.5 mL/hr over 240 Minutes Intravenous Every 8 hours 01/23/21 1244 01/27/21 2359       Objective: Vitals:   01/28/21 1534 01/28/21 2203 01/29/21 0502 01/29/21 1151  BP: (!) 153/84 (!) 163/92 (!) 171/89 (!) 155/74  Pulse: 77 82 73 81  Resp:  18 18   Temp: 99.2 F (37.3 C) 98.2 F (36.8 C) 98.7 F (37.1 C) 98.8 F (37.1 C)  TempSrc: Oral Oral Oral Oral  SpO2: 97% 99% 96% 100%  Weight:      Height:        Intake/Output Summary (Last 24 hours) at 01/29/2021 1845 Last data filed at 01/29/2021 1447 Gross per 24 hour  Intake 840 ml  Output 400 ml  Net 440 ml   Filed Weights   01/22/21 0847  Weight: 98.4 kg    Examination: General exam: Alert, awake, oriented x 3 Respiratory system: Clear to auscultation. Respiratory effort normal. Cardiovascular system:RRR. No murmurs, rubs, gallops. Gastrointestinal system: Abdomen is nondistended, soft and tender on right side. No organomegaly or masses felt. Normal bowel sounds heard. Central nervous system: Alert and oriented. No focal neurological deficits. Extremities: No C/C/E, +pedal pulses Skin: No rashes, lesions or ulcers Psychiatry: Judgement and insight appear normal. Mood & affect appropriate.     Data Reviewed: I have personally reviewed following labs and imaging studies  CBC: Recent Labs  Lab 01/24/21 0321 01/25/21 0153 01/26/21 0842 01/27/21 0159 01/28/21 0220  WBC 7.1 6.4 11.6* 11.3* 11.1*  HGB 14.9 14.9 14.9 13.9 13.2  HCT 45.2 46.9* 44.3 42.7  40.9  MCV 88.3 92.5 88.2 90.5 92.1  PLT 252 156 206 210 409   Basic Metabolic Panel: Recent Labs  Lab 01/24/21 0321 01/25/21 0153 01/26/21 0842 01/27/21 0159 01/28/21 0220  NA 140 147* 141 144 140  K 3.8 4.0 3.2* 3.3* 3.4*  CL 105 114* 108 108 107  CO2 23 25 18* 26 25  GLUCOSE 224* 133* 186* 114* 105*  BUN 48* 26* 11 13 12   CREATININE 1.27* 0.68 0.63 0.69 0.66  CALCIUM 8.7* 8.5* 8.3* 8.4* 8.7*  MG  --   --   --  1.9 1.9   GFR: Estimated Creatinine Clearance: 75.7 mL/min (by C-G formula based on SCr of 0.66 mg/dL). Liver Function Tests: No results for input(s): AST, ALT, ALKPHOS, BILITOT, PROT, ALBUMIN in the last 168 hours. No results for input(s): LIPASE, AMYLASE in the last 168 hours. No results for input(s): AMMONIA in the last 168 hours. Coagulation Profile: No results for input(s): INR, PROTIME in the last 168 hours. Cardiac Enzymes: No results for input(s): CKTOTAL, CKMB, CKMBINDEX, TROPONINI in the last 168 hours. BNP (last 3 results) No results for input(s): PROBNP in the last 8760 hours. HbA1C: No results for input(s): HGBA1C in the last 72 hours. CBG: Recent Labs  Lab 01/28/21 1828 01/29/21  3419 01/29/21 0702 01/29/21 1153 01/29/21 1758  GLUCAP 192* 128* 99 307* 215*   Lipid Profile: No results for input(s): CHOL, HDL, LDLCALC, TRIG, CHOLHDL, LDLDIRECT in the last 72 hours. Thyroid Function Tests: No results for input(s): TSH, T4TOTAL, FREET4, T3FREE, THYROIDAB in the last 72 hours. Anemia Panel: No results for input(s): VITAMINB12, FOLATE, FERRITIN, TIBC, IRON, RETICCTPCT in the last 72 hours. Sepsis Labs: Recent Labs  Lab 01/23/21 0059 01/23/21 1335 01/23/21 1606  LATICACIDVEN 4.2* 3.5* 2.8*    Recent Results (from the past 240 hour(s))  Resp Panel by RT-PCR (Flu A&B, Covid) Nasopharyngeal Swab     Status: None   Collection Time: 01/22/21  3:40 PM   Specimen: Nasopharyngeal Swab; Nasopharyngeal(NP) swabs in vial transport medium  Result  Value Ref Range Status   SARS Coronavirus 2 by RT PCR NEGATIVE NEGATIVE Final    Comment: (NOTE) SARS-CoV-2 target nucleic acids are NOT DETECTED.  The SARS-CoV-2 RNA is generally detectable in upper respiratory specimens during the acute phase of infection. The lowest concentration of SARS-CoV-2 viral copies this assay can detect is 138 copies/mL. A negative result does not preclude SARS-Cov-2 infection and should not be used as the sole basis for treatment or other patient management decisions. A negative result may occur with  improper specimen collection/handling, submission of specimen other than nasopharyngeal swab, presence of viral mutation(s) within the areas targeted by this assay, and inadequate number of viral copies(<138 copies/mL). A negative result must be combined with clinical observations, patient history, and epidemiological information. The expected result is Negative.  Fact Sheet for Patients:  EntrepreneurPulse.com.au  Fact Sheet for Healthcare Providers:  IncredibleEmployment.be  This test is no t yet approved or cleared by the [place] FDA and  has been authorized for detection and/or diagnosis of SARS-CoV-2 by FDA under an Emergency Use Authorization (EUA). This EUA will remain  in effect (meaning this test can be used) for the duration of the COVID-19 declaration under Section 564(b)(1) of the Act, 21 U.S.C.section 360bbb-3(b)(1), unless the authorization is terminated  or revoked sooner.       Influenza A by PCR NEGATIVE NEGATIVE Final   Influenza B by PCR NEGATIVE NEGATIVE Final    Comment: (NOTE) The Xpert Xpress SARS-CoV-2/FLU/RSV plus assay is intended as an aid in the diagnosis of influenza from Nasopharyngeal swab specimens and should not be used as a sole basis for treatment. Nasal washings and aspirates are unacceptable for Xpert Xpress SARS-CoV-2/FLU/RSV testing.  Fact Sheet for  Patients: EntrepreneurPulse.com.au  Fact Sheet for Healthcare Providers: IncredibleEmployment.be  This test is not yet approved or cleared by the [place] FDA and has been authorized for detection and/or diagnosis of SARS-CoV-2 by FDA under an Emergency Use Authorization (EUA). This EUA will remain in effect (meaning this test can be used) for the duration of the COVID-19 declaration under Section 564(b)(1) of the Act, 21 U.S.C. section 360bbb-3(b)(1), unless the authorization is terminated or revoked.  Performed at [facility], [address] [place], [place] [address]   Culture, blood (routine x 2)     Status: None   Collection Time: 01/23/21  1:20 PM   Specimen: BLOOD  Result Value Ref Range Status   Specimen Description BLOOD RIGHT ANTECUBITAL  Final   Special Requests   Final    BOTTLES DRAWN AEROBIC ONLY Blood Culture adequate volume   Culture   Final    NO GROWTH 5 DAYS Performed at [facility], [place]  [address], [place], [place] [address]    Report Status 01/28/2021 FINAL  Final  Culture, blood (routine x 2)     Status: None   Collection Time: 01/23/21  1:30 PM   Specimen: BLOOD RIGHT HAND  Result Value Ref Range Status   Specimen Description BLOOD RIGHT HAND  Final   Special Requests   Final    BOTTLES DRAWN AEROBIC ONLY Blood Culture adequate volume   Culture   Final    NO GROWTH 5 DAYS Performed at [facility], [place] [address], [place], [place] [address]    Report Status 01/28/2021 FINAL  Final  Culture, Urine     Status: None   Collection Time: 01/24/21  8:05 AM   Specimen: Urine, Random  Result Value Ref Range Status   Specimen Description URINE, RANDOM  Final   Special Requests NONE  Final   Culture   Final    NO GROWTH Performed at [facility], [place] [address] [place], [place] [address]    Report Status 01/25/2021 FINAL  Final      Radiology Studies: No results  found.    Scheduled Meds: . amLODipine  5 mg Oral Daily  . enoxaparin (LOVENOX) injection  40 mg Subcutaneous Q24H  . gabapentin  300 mg Oral BID  . insulin aspart  0-15 Units Subcutaneous Q6H  . insulin glargine  15 Units Subcutaneous QHS  . [START ON 01/30/2021] predniSONE  40 mg Oral Q breakfast  . rosuvastatin  10 mg Oral Daily   Continuous Infusions:    LOS: 7 days      Time spent: 30 minutes   Kathie Dike, MD [facility] 01/29/2021, 6:45 PM   Available via Epic secure chat 7am-7pm After these hours, please refer to coverage provider listed on [email or web address]

## 2021-01-30 LAB — BASIC METABOLIC PANEL
Anion gap: 6 (ref 5–15)
BUN: 10 mg/dL (ref 8–23)
CO2: 25 mmol/L (ref 22–32)
Calcium: 8.6 mg/dL — ABNORMAL LOW (ref 8.9–10.3)
Chloride: 108 mmol/L (ref 98–111)
Creatinine, Ser: 0.6 mg/dL (ref 0.44–1.00)
GFR, Estimated: >60 mL/min (ref >60)
Glucose, Bld: 103 mg/dL — ABNORMAL HIGH (ref 70–99)
Potassium: 2.9 mmol/L — ABNORMAL LOW (ref 3.5–5.1)
Sodium: 139 mmol/L (ref 135–145)

## 2021-01-30 LAB — GLUCOSE, CAPILLARY
Glucose-Capillary: 127 mg/dL — ABNORMAL HIGH (ref 70–99)
Glucose-Capillary: 227 mg/dL — ABNORMAL HIGH (ref 70–99)
Glucose-Capillary: 87 mg/dL (ref 70–99)

## 2021-01-30 LAB — MAGNESIUM: Magnesium: 1.8 mg/dL (ref 1.7–2.4)

## 2021-01-30 LAB — POTASSIUM: Potassium: 3.8 mmol/L (ref 3.5–5.1)

## 2021-01-30 MED ORDER — POTASSIUM CHLORIDE 10 MEQ/100ML IV SOLN
10.0000 meq | INTRAVENOUS | Status: AC
  Administered 2021-01-30 (×3): 10 meq via INTRAVENOUS
  Filled 2021-01-30 (×3): qty 100

## 2021-01-30 MED ORDER — POTASSIUM CHLORIDE CRYS ER 20 MEQ PO TBCR
40.0000 meq | EXTENDED_RELEASE_TABLET | ORAL | Status: AC
  Administered 2021-01-30 (×2): 40 meq via ORAL
  Filled 2021-01-30 (×2): qty 2

## 2021-01-30 MED ORDER — SACCHAROMYCES BOULARDII 250 MG PO CAPS: 250.0000 mg | ORAL_CAPSULE | Freq: Two times a day (BID) | ORAL | 0 refills | Status: DC

## 2021-01-30 NOTE — Progress Notes (Signed)
Inpatient Diabetes Program Recommendations  AACE/ADA: New Consensus Statement on Inpatient Glycemic Control (2015)  Target Ranges:  Prepandial:   less than 140 mg/dL      Peak postprandial:   less than 180 mg/dL (1-2 hours)      Critically ill patients:  140 - 180 mg/dL   Lab Results  Component Value Date   GLUCAP 227 (H) 01/30/2021   HGBA1C 7.3 (H) 01/22/2021   Diabetes history: DM 2 Outpatient Diabetes medications: metformin 500 mg Daily Current orders for Inpatient glycemic control:  Lantus 15 units qhs Novolog 0-15 units Q6 hours  PO prednisone 40 mg Daily  Inpatient Diabetes Program Recommendations:   -  Consider adding Novolog 3 units tid meal coverage (hold if patient eats less than 50% or NPO).  Thanks,  Adah Perl, RN, BC-ADM Inpatient Diabetes Coordinator Pager [phone] (8a-5p)

## 2021-01-30 NOTE — Discharge Summary (Signed)
Physician Discharge Summary  Aoi Kouns Waldrep KXF:[id number] DOB: June 24, 1946 DOA: 01/22/2021  PCP: [name], MD  Admit date: 01/22/2021 Discharge date: 01/30/2021  Admitted From: home Disposition:  home  Recommendations for Outpatient Follow-up:  1. Follow up with PCP in 1-2 weeks 2. Please obtain BMP/CBC in one week 3. Follow up with GI, Dr. Collene Mares in 1-2 weeks  Home Health:HH PT Equipment/Devices:  Discharge Condition:stable CODE STATUS:full code Diet recommendation: heart healthy, carb modified  Brief/Interim Summary: Mahiya Kercheval Bectonis a 75 y.o.femalewith medical history significant ofHTN, IDDM,rheumatoid arthritis,anxiety, depression, mild cognitive deficit who presented with new onset of abdominal pain, nausea, vomiting. Her symptoms started last night after dinner. Abdominal pain vaguelylocalized on the right aspect near umbilicus, pain has been cramping intermittent but becomes more constant this morning. Abdominal x-ray showed small bowel perforation, CT abdomen showed dilated bowels, suspicious for ileus cecal wall stricture.  General surgery as well as GI were consulted.  Discharge Diagnoses:  Principal Problem:   SBO (small bowel obstruction) (HCC) Active Problems:   Prolonged Q-T interval on ECG   Type 2 diabetes mellitus with diabetic neuropathy, unspecified (HCC)   Peripheral neuropathy   AKI (acute kidney injury) ([place])   Rheumatoid arthritis (HCC)  pSBO -CT A/P: Partial small bowel obstruction, with dilated fluid-filled loops of small bowel as well as stool sign within distended distal small bowel loops extending to the ileocecal valve. Associated mesenteric edema within some of the fluid-filled small bowel loops in the RIGHT mid abdomen as well as mild wall thickening of the cecum. Thickening of base of appendix though the remainder of the appendix appears normal. Findings do not appear to represent appendicitis and there is no evidence of perforation or  abscess. -General surgery, GI following -Trial of Solu-Medrol due to possibility of inflammatory component which has since been transitioned to prednisone taper -she will continue on prednisone 40mg  po daily until follow up with Dr. Collene Mares who will determine further taper -Slowly improving -currently on soft diet   SIRS, sepsis ruled out -Patient was afebrile, HR 81, RR 18, WBC 8.6 at the time of admission. Patient was tachycardic with leukocytosis 14.9 and lactic acidosis 4.2 with AKI -Covid, influenza negative -UA negative, urine culture negative -Chest x-ray unremarkable -Blood cultures negative to date -initially treated with IV antibiotics -hemodynamics have stabilized -no clear source of infection -IV zosyn discontinued 3/30 -Leukocytosis initially resolved, now elevated but could be due to steroid use. Monitor   Lactic acidosis -likely related to dehydration and metformin use -resolved with IV fluids  Hypernatremia -Resolved with D5-0.45NS   Acute hypoxemic respiratory failure -Patient with SPO2 89% on room air, currently on 2 L nasal cannula O2, wean as able -Chest x-ray unremarkable -resolved  AKI -Presented with creatinine 0.6, up to 1.27  -Renal ultrasound unremarkable -Resolved  Diabetes mellitus, insulin-dependent, poorly controlled with hyperglycemia -Hemoglobin A1c 7.3 -resume metformin on discharge -resume gabapentin -Continue Lantus, continue sliding scale insulin  Hypertension -resume home medication including Norvasc  Rheumatoid arthritis -resume home Plaquenil, Norco, methotrexate  Anxiety/depression -resume home Cymbalta  Mild cognitive deficit -Per daughter, patient has had some memory loss over the years, possibly early beginning of dementia.  Patient does live at home alone and is independent of ADLs at baseline  Prolonged QTc -Repeat EKG reviewed independently reveals QTC 500  Hypokalemia -Replace, trend  -Mg  normal  Discharge Instructions  Discharge Instructions    Diet - low sodium heart healthy   Complete by: As directed  Diet - low sodium heart healthy   Complete by: As directed    Increase activity slowly   Complete by: As directed    Increase activity slowly   Complete by: As directed      Allergies as of 01/30/2021      Reactions   Cephalexin Other (See Comments)   URINARY RETENTION = "blocked my urine"   Codeine Rash   Other reaction(s): hives   Nucynta [tapentadol Hcl] Other (See Comments)   Altered mental status      Medication List    STOP taking these medications   HYDROcodone-acetaminophen 5-325 MG tablet Commonly known as: NORCO/VICODIN   indomethacin 75 MG CR capsule Commonly known as: INDOCIN SR     TAKE these medications   acetaminophen 500 MG tablet Commonly known as: TYLENOL Take 1 tablet (500 mg total) by mouth every 6 (six) hours as needed. What changed: reasons to take this   amLODipine 5 MG tablet Commonly known as: NORVASC Take 5 mg by mouth daily.   aspirin 81 MG chewable tablet Chew 81 mg by mouth daily.   clotrimazole-betamethasone cream Commonly known as: LOTRISONE Apply 1 application topically in the morning and at bedtime.   DULoxetine 30 MG capsule Commonly known as: CYMBALTA Take 30 mg by mouth 2 (two) times daily.   fluticasone 50 MCG/ACT nasal spray Commonly known as: FLONASE Place 1 spray into both nostrils daily as needed for allergies or rhinitis.   folic acid 1 MG tablet Commonly known as: FOLVITE Take 1 mg by mouth daily.   furosemide 20 MG tablet Commonly known as: LASIX Take 20 mg by mouth daily as needed for edema. Leg swelling   gabapentin 300 MG capsule Commonly known as: NEURONTIN Take 300 mg by mouth 2 (two) times daily.   hydroxychloroquine 200 MG tablet Commonly known as: PLAQUENIL Take 200 mg by mouth daily. WITH FOOD OR MILK   metFORMIN 500 MG tablet Commonly known as: GLUCOPHAGE Take 500 mg by  mouth daily.   methocarbamol 500 MG tablet Commonly known as: Robaxin Take 1 tablet (500 mg total) by mouth 3 (three) times daily as needed. What changed: reasons to take this   methotrexate 2.5 MG tablet Commonly known as: RHEUMATREX Take 20 mg by mouth once a week. 8 tablets weekly (Sunday)   nystatin cream Commonly known as: MYCOSTATIN Apply to affected area 2 times daily. groin What changed:   how much to take  how to take this  when to take this  reasons to take this  additional instructions   ondansetron 4 MG tablet Commonly known as: ZOFRAN Take 4 mg by mouth every 8 (eight) hours as needed for nausea or vomiting.   predniSONE 10 MG tablet Commonly known as: DELTASONE Take 4 tablets (40 mg total) by mouth daily with breakfast.   rosuvastatin 10 MG tablet Commonly known as: CRESTOR Take 10 mg by mouth daily.   saccharomyces boulardii 250 MG capsule Commonly known as: Florastor Take 1 capsule (250 mg total) by mouth 2 (two) times daily.   triamcinolone 0.1 % Commonly known as: KENALOG Apply 1 application topically daily.            Durable Medical Equipment  (From admission, onward)         Start     Ordered   01/27/21 1141  For home use only DME 3 n 1  Once        03 /29/22 1140   01/27/21 1141  For  home use only DME Walker rolling  Once       Question Answer Comment  Walker: With 5 Inch Wheels   Patient needs a walker to treat with the following condition Weakness      01/27/21 1140          Follow-up Information    [facility] Follow up.   Specialty: Home Health Services Contact information: [place] [address] [phone]        Juanita Craver, MD. Schedule an appointment as soon as possible for a visit.   Specialty: Gastroenterology Contact information: [address] [place] [address] [phone]        [name], MD Follow up.   Specialty: Internal  Medicine Contact information: [place] [address] [phone]              Allergies  Allergen Reactions  . Cephalexin Other (See Comments)    URINARY RETENTION = "blocked my urine"  . Codeine Rash    Other reaction(s): hives  . Nucynta [Tapentadol Hcl] Other (See Comments)    Altered mental status    Consultations:  Gastroenterology  General surgery   Procedures/Studies: DG Abdomen 1 View  Result Date: 01/22/2021 CLINICAL DATA:  Enteric catheter placement EXAM: ABDOMEN - 1 VIEW COMPARISON:  01/22/2021 FINDINGS: Frontal view of the lower chest and upper abdomen demonstrates enteric catheter passing below diaphragm tip and side port projecting over the gastric body. Lung bases are clear. Moderate stool within the colon. IMPRESSION: 1. Enteric catheter overlying gastric body. Electronically Signed   By: Randa Ngo M.D.   On: 01/22/2021 15:25   CT Abdomen Pelvis W Contrast  Result Date: 01/22/2021 CLINICAL DATA:  Nausea, vomiting, mid upper abdominal pain since last night, history hypertension, diabetes mellitus, GERD, hysterectomy, cholecystectomy, umbilical hernia repair EXAM: CT ABDOMEN AND PELVIS WITH CONTRAST TECHNIQUE: Multidetector CT imaging of the abdomen and pelvis was performed using the standard protocol following bolus administration of intravenous contrast. Sagittal and coronal MPR images reconstructed from axial data set. CONTRAST:  128mL OMNIPAQUE IOHEXOL 300 MG/ML SOLN IV. No oral contrast. COMPARISON:  04/02/2020 FINDINGS: Lower chest: Bibasilar atelectasis Hepatobiliary: Gallbladder surgically absent. Fatty infiltration of liver. No focal hepatic mass. Pancreas: Normal appearance Spleen: Normal appearance. Question tiny splenules versus normal sized lymph nodes Adrenals/Urinary Tract: Thickening of adrenal glands without discrete mass. Kidneys, ureters, and bladder normal appearance Stomach/Bowel: Base of appendix is minimally  thickened but remainder of appendix is normal caliber without inflammatory changes. Dilated small loops distally, some containing fluid but most distal loops contain stool sign. Associated edema within some of the mesentery of the fluid filled small bowel loops. Dilated small bowel loops with stool extend to the ileocecal valve. Prominent stool is seen throughout the colon. Mild wall thickening of the cecum and ascending colon. Stomach and remaining bowel loops unremarkable. Vascular/Lymphatic: Vascular structures patent. Atherosclerotic calcifications at the origins of the renal arteries and at aortic bifurcation. Aorta normal caliber. Few scattered normal size mesenteric and retroperitoneal lymph nodes without adenopathy. Reproductive: Uterus surgically absent. Nonvisualization of ovaries. Other: Free fluid in pelvis, interloop, perihepatic, and minimally at RIGHT gutter. No free air. No hernia. Musculoskeletal: Osseous structures unremarkable. IMPRESSION: Partial small bowel obstruction, with dilated fluid-filled loops of small bowel as well as stool sign within distended distal small bowel loops extending to the ileocecal valve. Associated mesenteric edema within some of the fluid-filled small bowel loops in the RIGHT  mid abdomen as well as mild wall thickening of the cecum. Thickening of base of appendix though the remainder of the appendix appears normal. Differential diagnosis would include stricture at the ileocecal valve with partial small bowel obstruction, inflammatory bowel disease involving the cecum and ileocecal valve with partial obstruction versus functional obstruction, or partial small bowel obstruction secondary to adhesions with associated edema and inflammation. Findings do not appear to represent appendicitis and there is no evidence of perforation or abscess. Electronically Signed   By: Lavonia Dana M.D.   On: 01/22/2021 11:23   US RENAL  Result Date: 01/24/2021 CLINICAL DATA:  75 year old  female with acute kidney injury. EXAM: RENAL / URINARY TRACT ULTRASOUND COMPLETE COMPARISON:  None. FINDINGS: Right Kidney: Renal measurements: 10.9 x 5.2 x 5.5 cm = volume: 163 mL. Echogenicity within normal limits. No mass or hydronephrosis visualized. Left Kidney: Renal measurements: 11.8 x 6.4 x 6.2 cm = volume: 244 mL. Echogenicity within normal limits. No mass or hydronephrosis visualized. Pseudo mass in the UPPER LEFT kidney noted. Bladder: Appears normal for degree of bladder distention. Other: None. IMPRESSION: Normal renal ultrasound. Electronically Signed   By: Margarette Canada M.D.   On: 01/24/2021 11:26   DG CHEST PORT 1 VIEW  Result Date: 01/23/2021 CLINICAL DATA:  Respiratory failure EXAM: PORTABLE CHEST 1 VIEW COMPARISON:  04/02/2020 FINDINGS: Low volume AP portable examination with perhaps minimal bibasilar atelectasis. Mild cardiomegaly. Esophagogastric tube with tip and side port below the diaphragm. IMPRESSION: 1. Low volume AP portable examination with perhaps minimal bibasilar atelectasis. 2. Mild cardiomegaly. Electronically Signed   By: Eddie Candle M.D.   On: 01/23/2021 14:44   DG Abd Portable 1V  Result Date: 01/24/2021 CLINICAL DATA:  Abdominal pain and small-bowel obstruction. EXAM: PORTABLE ABDOMEN - 1 VIEW COMPARISON:  01/23/2021 and prior studies FINDINGS: NG tube again noted with tip overlying the mid stomach. Contrast and gas-filled small bowel loops are noted, none which appear distended now. No dilated bowel loops are present. IMPRESSION: Contrast and gas-filled small bowel loops without bowel distention. Electronically Signed   By: Margarette Canada M.D.   On: 01/24/2021 09:12   DG Abd Portable 1V-Small Bowel Obstruction Protocol-24 hr delay  Result Date: 01/23/2021 CLINICAL DATA:  Abdominal pain, small-bowel obstruction EXAM: PORTABLE ABDOMEN - 1 VIEW COMPARISON:  01/23/2021 at 6:43 a.m. FINDINGS: Supine frontal views of the abdomen and pelvis are obtained. Enteric catheter tip  and side port project over the gastric body. Moderate stool throughout the colon. Distended gas-filled loops of small bowel are seen within the central abdomen, measuring up to 3.4 cm in diameter. The previously administered oral contrast is too dilute to be visualized. No acute bony abnormalities. IMPRESSION: 1. Persisting gaseous distension of small bowel consistent with ileus or obstruction. The previously administered oral contrast is too dilute to be identified on this exam. 2. Moderate stool throughout the colon. Electronically Signed   By: Randa Ngo M.D.   On: 01/23/2021 23:29   DG Abd Portable 1V-Small Bowel Obstruction Protocol-initial, 8 hr delay  Result Date: 01/23/2021 CLINICAL DATA:  Recent small-bowel obstruction EXAM: PORTABLE ABDOMEN - 1 VIEW COMPARISON:  CT abdomen and pelvis January 22, 2021; abdominal radiograph January 22, 2021 FINDINGS: Nasogastric tube tip and side port in stomach. There is diffuse stool throughout the colon. There is oral contrast in the stomach; oral contrast seen beyond the stomach. There is intravenous contrast in the urinary bladder. There is currently no appreciable bowel dilatation or air-fluid level to  suggest bowel obstruction. No free air. Scattered calcifications noted on the left which may have vascular etiology. Visualized lung bases clear. IMPRESSION: Nasogastric tube tip and side port in stomach. No contrast beyond the stomach. Question a degree of ileus. Diffuse stool throughout colon. No bowel dilatation or air-fluid level to suggest bowel obstruction by radiography. No free air evident. Electronically Signed   By: Lowella Grip III M.D.   On: 01/23/2021 08:24   MM 3D SCREEN BREAST BILATERAL  Result Date: 01/15/2021 CLINICAL DATA:  Screening. EXAM: DIGITAL SCREENING BILATERAL MAMMOGRAM WITH TOMOSYNTHESIS AND CAD TECHNIQUE: Bilateral screening digital craniocaudal and mediolateral oblique mammograms were obtained. Bilateral screening digital breast  tomosynthesis was performed. The images were evaluated with computer-aided detection. COMPARISON:  Previous exam(s). ACR Breast Density Category c: The breast tissue is heterogeneously dense, which may obscure small masses. FINDINGS: There are no findings suspicious for malignancy. The images were evaluated with computer-aided detection. IMPRESSION: No mammographic evidence of malignancy. A result letter of this screening mammogram will be mailed directly to the patient. RECOMMENDATION: Screening mammogram in one year. (Code:SM-B-01Y) BI-RADS CATEGORY  1: Negative. Electronically Signed   By: Franki Cabot M.D.   On: 01/15/2021 13:46       Subjective: Feeling better today. Bowel movements are better today, no vomiting  Discharge Exam: Vitals:   01/29/21 0502 01/29/21 1151 01/29/21 1949 01/30/21 0530  BP: (!) 171/89 (!) 155/74 (!) 148/69 (!) 145/71  Pulse: 73 81 78 77  Resp: 18  20 20   Temp: 98.7 F (37.1 C) 98.8 F (37.1 C) 98.2 F (36.8 C) 98.9 F (37.2 C)  TempSrc: Oral Oral Oral Oral  SpO2: 96% 100% 99% 92%  Weight:      Height:        General: Pt is alert, awake, not in acute distress Cardiovascular: RRR, S1/S2 +, no rubs, no gallops Respiratory: CTA bilaterally, no wheezing, no rhonchi Abdominal: Soft, NT, ND, bowel sounds + Extremities: no edema, no cyanosis    The results of significant diagnostics from this hospitalization (including imaging, microbiology, ancillary and laboratory) are listed below for reference.     Microbiology: Recent Results (from the past 240 hour(s))  Resp Panel by RT-PCR (Flu A&B, Covid) Nasopharyngeal Swab     Status: None   Collection Time: 01/22/21  3:40 PM   Specimen: Nasopharyngeal Swab; Nasopharyngeal(NP) swabs in vial transport medium  Result Value Ref Range Status   SARS Coronavirus 2 by RT PCR NEGATIVE NEGATIVE Final    Comment: (NOTE) SARS-CoV-2 target nucleic acids are NOT DETECTED.  The SARS-CoV-2 RNA is generally detectable  in upper respiratory specimens during the acute phase of infection. The lowest concentration of SARS-CoV-2 viral copies this assay can detect is 138 copies/mL. A negative result does not preclude SARS-Cov-2 infection and should not be used as the sole basis for treatment or other patient management decisions. A negative result may occur with  improper specimen collection/handling, submission of specimen other than nasopharyngeal swab, presence of viral mutation(s) within the areas targeted by this assay, and inadequate number of viral copies(<138 copies/mL). A negative result must be combined with clinical observations, patient history, and epidemiological information. The expected result is Negative.  Fact Sheet for Patients:  EntrepreneurPulse.com.au  Fact Sheet for Healthcare Providers:  IncredibleEmployment.be  This test is no t yet approved or cleared by the [place] FDA and  has been authorized for detection and/or diagnosis of SARS-CoV-2 by FDA under an Emergency Use Authorization (EUA). This EUA will  remain  in effect (meaning this test can be used) for the duration of the COVID-19 declaration under Section 564(b)(1) of the Act, 21 U.S.C.section 360bbb-3(b)(1), unless the authorization is terminated  or revoked sooner.       Influenza A by PCR NEGATIVE NEGATIVE Final   Influenza B by PCR NEGATIVE NEGATIVE Final    Comment: (NOTE) The Xpert Xpress SARS-CoV-2/FLU/RSV plus assay is intended as an aid in the diagnosis of influenza from Nasopharyngeal swab specimens and should not be used as a sole basis for treatment. Nasal washings and aspirates are unacceptable for Xpert Xpress SARS-CoV-2/FLU/RSV testing.  Fact Sheet for Patients: EntrepreneurPulse.com.au  Fact Sheet for Healthcare Providers: IncredibleEmployment.be  This test is not yet approved or cleared by the [place] FDA and has  been authorized for detection and/or diagnosis of SARS-CoV-2 by FDA under an Emergency Use Authorization (EUA). This EUA will remain in effect (meaning this test can be used) for the duration of the COVID-19 declaration under Section 564(b)(1) of the Act, 21 U.S.C. section 360bbb-3(b)(1), unless the authorization is terminated or revoked.  Performed at [facility], [address] [place], [place] [address]   Culture, blood (routine x 2)     Status: None   Collection Time: 01/23/21  1:20 PM   Specimen: BLOOD  Result Value Ref Range Status   Specimen Description BLOOD RIGHT ANTECUBITAL  Final   Special Requests   Final    BOTTLES DRAWN AEROBIC ONLY Blood Culture adequate volume   Culture   Final    NO GROWTH 5 DAYS Performed at [facility], [place] [address] [place], [place] [address]    Report Status 01/28/2021 FINAL  Final  Culture, blood (routine x 2)     Status: None   Collection Time: 01/23/21  1:30 PM   Specimen: BLOOD RIGHT HAND  Result Value Ref Range Status   Specimen Description BLOOD RIGHT HAND  Final   Special Requests   Final    BOTTLES DRAWN AEROBIC ONLY Blood Culture adequate volume   Culture   Final    NO GROWTH 5 DAYS Performed at [facility], [place] [address], [place], [place] [address]    Report Status 01/28/2021 FINAL  Final  Culture, Urine     Status: None   Collection Time: 01/24/21  8:05 AM   Specimen: Urine, Random  Result Value Ref Range Status   Specimen Description URINE, RANDOM  Final   Special Requests NONE  Final   Culture   Final    NO GROWTH Performed at [facility], [place] [address] [place], [place] [address]    Report Status 01/25/2021 FINAL  Final     Labs: BNP (last 3 results) No results for input(s): BNP in the last 8760 hours. Basic Metabolic Panel: Recent Labs  Lab 01/25/21 0153 01/26/21 0842 01/27/21 0159 01/28/21 0220 01/30/21 0231 01/30/21 1407  NA 147* 141 144 140 139  --   K 4.0 3.2*  3.3* 3.4* 2.9* 3.8  CL 114* 108 108 107 108  --   CO2 25 18* 26 25 25   --   GLUCOSE 133* 186* 114* 105* 103*  --   BUN 26* 11 13 12 10   --   CREATININE 0.68 0.63 0.69 0.66 0.60  --   CALCIUM 8.5* 8.3* 8.4* 8.7* 8.6*  --   MG  --   --  1.9 1.9 1.8  --    Liver Function Tests:  No results for input(s): AST, ALT, ALKPHOS, BILITOT, PROT, ALBUMIN in the last 168 hours. No results for input(s): LIPASE, AMYLASE in the last 168 hours. No results for input(s): AMMONIA in the last 168 hours. CBC: Recent Labs  Lab 01/24/21 0321 01/25/21 0153 01/26/21 0842 01/27/21 0159 01/28/21 0220  WBC 7.1 6.4 11.6* 11.3* 11.1*  HGB 14.9 14.9 14.9 13.9 13.2  HCT 45.2 46.9* 44.3 42.7 40.9  MCV 88.3 92.5 88.2 90.5 92.1  PLT 252 156 206 210 212   Cardiac Enzymes: No results for input(s): CKTOTAL, CKMB, CKMBINDEX, TROPONINI in the last 168 hours. BNP: Invalid input(s): POCBNP CBG: Recent Labs  Lab 01/29/21 1153 01/29/21 1758 01/30/21 0018 01/30/21 0528 01/30/21 1208  GLUCAP 307* 215* 127* 87 227*   D-Dimer No results for input(s): DDIMER in the last 72 hours. Hgb A1c No results for input(s): HGBA1C in the last 72 hours. Lipid Profile No results for input(s): CHOL, HDL, LDLCALC, TRIG, CHOLHDL, LDLDIRECT in the last 72 hours. Thyroid function studies No results for input(s): TSH, T4TOTAL, T3FREE, THYROIDAB in the last 72 hours.  Invalid input(s): FREET3 Anemia work up No results for input(s): VITAMINB12, FOLATE, FERRITIN, TIBC, IRON, RETICCTPCT in the last 72 hours. Urinalysis    Component Value Date/Time   COLORURINE YELLOW 01/24/2021 0805   APPEARANCEUR CLEAR 01/24/2021 0805   LABSPEC 1.024 01/24/2021 0805   PHURINE 5.0 01/24/2021 0805   GLUCOSEU NEGATIVE 01/24/2021 0805   HGBUR NEGATIVE 01/24/2021 0805   BILIRUBINUR NEGATIVE 01/24/2021 0805   KETONESUR NEGATIVE 01/24/2021 0805   PROTEINUR NEGATIVE 01/24/2021 0805   UROBILINOGEN 0.2 06/15/2014 0524   NITRITE NEGATIVE 01/24/2021  0805   LEUKOCYTESUR NEGATIVE 01/24/2021 0805   Sepsis Labs Invalid input(s): PROCALCITONIN,  WBC,  LACTICIDVEN Microbiology Recent Results (from the past 240 hour(s))  Resp Panel by RT-PCR (Flu A&B, Covid) Nasopharyngeal Swab     Status: None   Collection Time: 01/22/21  3:40 PM   Specimen: Nasopharyngeal Swab; Nasopharyngeal(NP) swabs in vial transport medium  Result Value Ref Range Status   SARS Coronavirus 2 by RT PCR NEGATIVE NEGATIVE Final    Comment: (NOTE) SARS-CoV-2 target nucleic acids are NOT DETECTED.  The SARS-CoV-2 RNA is generally detectable in upper respiratory specimens during the acute phase of infection. The lowest concentration of SARS-CoV-2 viral copies this assay can detect is 138 copies/mL. A negative result does not preclude SARS-Cov-2 infection and should not be used as the sole basis for treatment or other patient management decisions. A negative result may occur with  improper specimen collection/handling, submission of specimen other than nasopharyngeal swab, presence of viral mutation(s) within the areas targeted by this assay, and inadequate number of viral copies(<138 copies/mL). A negative result must be combined with clinical observations, patient history, and epidemiological information. The expected result is Negative.  Fact Sheet for Patients:  EntrepreneurPulse.com.au  Fact Sheet for Healthcare Providers:  IncredibleEmployment.be  This test is no t yet approved or cleared by the [place] FDA and  has been authorized for detection and/or diagnosis of SARS-CoV-2 by FDA under an Emergency Use Authorization (EUA). This EUA will remain  in effect (meaning this test can be used) for the duration of the COVID-19 declaration under Section 564(b)(1) of the Act, 21 U.S.C.section 360bbb-3(b)(1), unless the authorization is terminated  or revoked sooner.       Influenza A by PCR NEGATIVE NEGATIVE Final    Influenza B by PCR NEGATIVE NEGATIVE Final    Comment: (NOTE) The Xpert Xpress SARS-CoV-2/FLU/RSV  plus assay is intended as an aid in the diagnosis of influenza from Nasopharyngeal swab specimens and should not be used as a sole basis for treatment. Nasal washings and aspirates are unacceptable for Xpert Xpress SARS-CoV-2/FLU/RSV testing.  Fact Sheet for Patients: EntrepreneurPulse.com.au  Fact Sheet for Healthcare Providers: IncredibleEmployment.be  This test is not yet approved or cleared by the [place] FDA and has been authorized for detection and/or diagnosis of SARS-CoV-2 by FDA under an Emergency Use Authorization (EUA). This EUA will remain in effect (meaning this test can be used) for the duration of the COVID-19 declaration under Section 564(b)(1) of the Act, 21 U.S.C. section 360bbb-3(b)(1), unless the authorization is terminated or revoked.  Performed at [facility], [address] [place], [place] [address]   Culture, blood (routine x 2)     Status: None   Collection Time: 01/23/21  1:20 PM   Specimen: BLOOD  Result Value Ref Range Status   Specimen Description BLOOD RIGHT ANTECUBITAL  Final   Special Requests   Final    BOTTLES DRAWN AEROBIC ONLY Blood Culture adequate volume   Culture   Final    NO GROWTH 5 DAYS Performed at [facility], [place] [address], [place], [place] [address]    Report Status 01/28/2021 FINAL  Final  Culture, blood (routine x 2)     Status: None   Collection Time: 01/23/21  1:30 PM   Specimen: BLOOD RIGHT HAND  Result Value Ref Range Status   Specimen Description BLOOD RIGHT HAND  Final   Special Requests   Final    BOTTLES DRAWN AEROBIC ONLY Blood Culture adequate volume   Culture   Final    NO GROWTH 5 DAYS Performed at [facility], [place] [address] [place], [place] [address]    Report Status 01/28/2021 FINAL  Final  Culture, Urine     Status: None   Collection  Time: 01/24/21  8:05 AM   Specimen: Urine, Random  Result Value Ref Range Status   Specimen Description URINE, RANDOM  Final   Special Requests NONE  Final   Culture   Final    NO GROWTH Performed at [facility], [place] [address], [place], [place] [address]    Report Status 01/25/2021 FINAL  Final     Time coordinating discharge: 53mins  SIGNED:   Kathie Dike, MD  [facility] 01/30/2021, 3:04 PM   If 7PM-7AM, please contact night-coverage [email or web address]

## 2021-01-30 NOTE — TOC Progression Note (Signed)
Transition of Care [facility]) - Progression Note    Patient Details  Name: Diane [name] MRN: [id number] Date of Birth: February 10, 1946  Transition of Care [facility]) CM/SW Contact  Jacalyn Lefevre Edson Snowball, RN Phone Number: 01/30/2021, 3:04 PM  Clinical Narrative:     [facility] aware discharge today.   Expected Discharge Plan: [place] Barriers to Discharge: Continued Medical Work up  Expected Discharge Plan and Services Expected Discharge Plan: [place]   Discharge Planning Services: CM Consult Post Acute Care Choice: Home Health,Durable Medical Equipment Living arrangements for the past 2 months: Single Family Home Expected Discharge Date: 01/30/21               DME Arranged: 3-N-1,Walker rolling DME Agency: [employer]       HH Arranged: PT,Nurse's Aide           Social Determinants of Health (SDOH) Interventions    Readmission Risk Interventions No flowsheet data found.

## 2021-01-30 NOTE — Progress Notes (Signed)
Occupational Therapy Treatment Patient Details Name: Diane [name] MRN: [id number] DOB: 1946/01/05 Today's Date: 01/30/2021    History of present illness Pt is a 75 y/o female admitted 3/24 secondary to nausea and vomiting. Pt found to have small bowel obstruction and had NG tube placed. PMH includes DM, RA, lap chole, HTN, umbilical hernia repair.   OT comments  Pt making good progress towards OT goals this session. Pt received in bathroom, needing supervision for functional mobility with RW and supervision for UB/LB bathing at sink . Pt required [place] for LB dressing to thread RLE into pants as pt reports RLE as her weaker extremity. Pt completed household distance functional mobility with RW and gross supervision. Pt with good safety awareness throughout session. Pt likely to DC home later today with f/u recs as indicated below. Will continue to follow acutely per POC.    Follow Up Recommendations  Home health OT;Supervision/Assistance - 24 hour    Equipment Recommendations  None recommended by OT    Recommendations for Other Services      Precautions / Restrictions Precautions Precautions: Fall Restrictions Weight Bearing Restrictions: No       Mobility Bed Mobility Overal bed mobility: Needs Assistance             General bed mobility comments: pt OOB in BR upon arrival, returned to recliner at end of session    Transfers Overall transfer level: Needs assistance Equipment used: Rolling walker (2 wheeled) Transfers: Sit to/from Stand Sit to Stand: Supervision         General transfer comment: pt completed multiple sit<>stands during bathing and dressing tasks with no more than supervision for safety    Balance Overall balance assessment: Needs assistance Sitting-balance support: No upper extremity supported;Feet supported Sitting balance-Leahy Scale: Good     Standing balance support: During functional activity;No upper extremity supported Standing  balance-Leahy Scale: Fair Standing balance comment: close supervision for dynamic ADL tasks                           ADL either performed or assessed with clinical judgement   ADL Overall ADL's : Needs assistance/impaired     Grooming: Wash/dry face;Standing;Supervision/safety Grooming Details (indicate cue type and reason): standing at sink with RW Upper Body Bathing: Supervision/ safety;Standing   Lower Body Bathing: Supervison/ safety;Sit to/from stand   Upper Body Dressing : Set up;Sitting   Lower Body Dressing: Minimal assistance;Sit to/from stand;Total assistance Lower Body Dressing Details (indicate cue type and reason): MIN A needed to thread RLE into pants as pt reports this is her weaker leg, total A for socks as pt reports at home she is able to don socks with feet on ottoman Toilet Transfer: Supervision/safety;Ambulation;RW Toilet Transfer Details (indicate cue type and reason): gross supervision for mobility from regular toilet back to chair         Functional mobility during ADLs: Supervision/safety;Rolling walker General ADL Comments: pt completed full wash up at sink needing needing supervision- set- up assist for safety with RW, pt reports going to her sons house and then to her daughters when she gets in from [place]: Awake/alert Behavior During Therapy: WFL for tasks assessed/performed Overall Cognitive Status: Within Functional Limits for tasks assessed  Exercises     Shoulder Instructions       General Comments VSS on RA    Pertinent Vitals/ Pain       Pain Assessment: No/denies pain  Home Living                                          Prior Functioning/Environment              Frequency  Min 2X/week        Progress Toward Goals  OT Goals(current goals can now be found in  the care plan section)  Progress towards OT goals: Progressing toward goals  Acute Rehab OT Goals Patient Stated Goal: to go home OT Goal Formulation: With patient Time For Goal Achievement: 02/09/21 Potential to Achieve Goals: Good  Plan Discharge plan remains appropriate;Frequency remains appropriate    Co-evaluation                 AM-PAC OT "6 Clicks" Daily Activity     Outcome Measure   Help from another person eating meals?: None Help from another person taking care of personal grooming?: None Help from another person toileting, which includes using toliet, bedpan, or urinal?: A Little Help from another person bathing (including washing, rinsing, drying)?: A Little Help from another person to put on and taking off regular upper body clothing?: None Help from another person to put on and taking off regular lower body clothing?: A Little 6 Click Score: 21    End of Session Equipment Utilized During Treatment: Rolling walker  OT Visit Diagnosis: Unsteadiness on feet (R26.81);Muscle weakness (generalized) (M62.81)   Activity Tolerance Patient tolerated treatment well   Patient Left in chair;with call bell/phone within reach;with chair alarm set   Nurse Communication Mobility status        Time: 3149-7026 OT Time Calculation (min): 14 min  Charges: OT General Charges $OT Visit: 1 Visit OT Treatments $Self Care/Home Management : 8-22 mins  Harley Alto., COTA/L Acute Rehabilitation Services [phone]    Diane [name] 01/30/2021, 4:41 PM

## 2021-01-30 NOTE — Progress Notes (Signed)
Physical Therapy Treatment Patient Details Name: Diane [name] MRN: [id number] DOB: [birth year] Today's Date: 01/30/2021    History of Present Illness Pt is a 75 y/o female admitted 3/24 secondary to nausea and vomiting. Pt found to have small bowel obstruction and had NG tube placed. PMH includes DM, RA, lap chole, HTN, umbilical hernia repair.    PT Comments    The pt was seen for progression of ambulation and OOB mobility to include stair training prior to anticipated d/c home. The pt was able to demo good independence with sit-stand transfers, and was able to progress ambulation distance with RW at this time, but continues to ambulate with significantly slowed speed and increased reliance on BUE support. The pt was able to complete stairs with minG and use of singe rail, but notes significant weakness in BLE that limits endurance and speed of all movements. The pt will continue to benefit from skilled PT acutely and following d/c to maximize return to prior level of independence and reduce reliance on AD. All education completed, and pt safe for d/c home with family when medically cleared.    Follow Up Recommendations  Home health PT;Supervision for mobility/OOB     Equipment Recommendations  Rolling walker with 5" wheels;3in1 (PT)    Recommendations for Other Services       Precautions / Restrictions Precautions Precautions: Fall Restrictions Weight Bearing Restrictions: No    Mobility  Bed Mobility Overal bed mobility: Needs Assistance             General bed mobility comments: In recliner upon entry    Transfers Overall transfer level: Needs assistance Equipment used: Rolling walker (2 wheeled) Transfers: Sit to/from Stand Sit to Stand: Min guard         General transfer comment: cues for hand placement, pt able to power up and steady without assist  Ambulation/Gait Ambulation/Gait assistance: Min guard;Supervision Gait Distance (Feet): 125 Feet Assistive  device: Rolling walker (2 wheeled) Gait Pattern/deviations: Step-through pattern;Decreased stride length;Wide base of support Gait velocity: 0.8 m/s Gait velocity interpretation: <1.8 ft/sec, indicate of risk for recurrent falls General Gait Details: slowed gait with good stability but significantly increased time to complete turns/navigation   Stairs Stairs: Yes Stairs assistance: Min guard Stair Management: One rail Left;Step to pattern;Forwards Number of Stairs: 3 General stair comments: slow but steady with stairs      Balance Overall balance assessment: Needs assistance Sitting-balance support: No upper extremity supported;Feet supported Sitting balance-Leahy Scale: Good     Standing balance support: During functional activity;Bilateral upper extremity supported Standing balance-Leahy Scale: Poor Standing balance comment: static stand or stand and reach with single UE support, BUE for gait                            Cognition Arousal/Alertness: Awake/alert Behavior During Therapy: WFL for tasks assessed/performed Overall Cognitive Status: Within Functional Limits for tasks assessed                                        Exercises      General Comments General comments (skin integrity, edema, etc.): VSS on RA      Pertinent Vitals/Pain Pain Assessment: No/denies pain           PT Goals (current goals can now be found in the care plan section) Acute Rehab PT Goals  Patient Stated Goal: to go home PT Goal Formulation: With patient Time For Goal Achievement: 02/09/21 Potential to Achieve Goals: Good Progress towards PT goals: Progressing toward goals    Frequency    Min 3X/week      PT Plan Current plan remains appropriate       AM-PAC PT "6 Clicks" Mobility   Outcome Measure  Help needed turning from your back to your side while in a flat bed without using bedrails?: None Help needed moving from lying on your back to  sitting on the side of a flat bed without using bedrails?: A Little Help needed moving to and from a bed to a chair (including a wheelchair)?: A Little Help needed standing up from a chair using your arms (e.g., wheelchair or bedside chair)?: A Little Help needed to walk in hospital room?: A Little Help needed climbing 3-5 steps with a railing? : A Little 6 Click Score: 19    End of Session Equipment Utilized During Treatment: Gait belt Activity Tolerance: Patient tolerated treatment well Patient left: in chair;with call bell/phone within reach;with chair alarm set Nurse Communication: Mobility status PT Visit Diagnosis: Unsteadiness on feet (R26.81);Muscle weakness (generalized) (M62.81)     Time: 0768-0881 PT Time Calculation (min) (ACUTE ONLY): 27 min  Charges:  $Gait Training: 23-37 mins                     Karma Ganja, PT, DPT   Acute Rehabilitation Department Pager #: [phone]   Otho Bellows 01/30/2021, 2:53 PM

## 2021-02-03 DIAGNOSIS — K5730 Diverticulosis of large intestine without perforation or abscess without bleeding: Secondary | ICD-10-CM | POA: Diagnosis not present

## 2021-02-03 DIAGNOSIS — R933 Abnormal findings on diagnostic imaging of other parts of digestive tract: Secondary | ICD-10-CM | POA: Diagnosis not present

## 2021-02-03 DIAGNOSIS — R112 Nausea with vomiting, unspecified: Secondary | ICD-10-CM | POA: Diagnosis not present

## 2021-02-03 DIAGNOSIS — Z86010 Personal history of colonic polyps: Secondary | ICD-10-CM | POA: Diagnosis not present

## 2021-02-03 DIAGNOSIS — K760 Fatty (change of) liver, not elsewhere classified: Secondary | ICD-10-CM | POA: Diagnosis not present

## 2021-02-04 ENCOUNTER — Emergency Department (HOSPITAL_BASED_OUTPATIENT_CLINIC_OR_DEPARTMENT_OTHER): Payer: Medicare Other

## 2021-02-04 ENCOUNTER — Encounter (HOSPITAL_COMMUNITY): Payer: Self-pay

## 2021-02-04 ENCOUNTER — Other Ambulatory Visit: Payer: Self-pay

## 2021-02-04 ENCOUNTER — Inpatient Hospital Stay (HOSPITAL_COMMUNITY)
Admission: EM | Admit: 2021-02-04 | Discharge: 2021-02-10 | DRG: 272 | Disposition: A | Discharge status: Skilled Nursing Facility | Payer: Medicare Other | Attending: Internal Medicine | Admitting: Internal Medicine

## 2021-02-04 DIAGNOSIS — I10 Essential (primary) hypertension: Secondary | ICD-10-CM | POA: Diagnosis not present

## 2021-02-04 DIAGNOSIS — K219 Gastro-esophageal reflux disease without esophagitis: Secondary | ICD-10-CM | POA: Diagnosis not present

## 2021-02-04 DIAGNOSIS — K5900 Constipation, unspecified: Secondary | ICD-10-CM

## 2021-02-04 DIAGNOSIS — F32A Depression, unspecified: Secondary | ICD-10-CM | POA: Diagnosis present

## 2021-02-04 DIAGNOSIS — E1140 Type 2 diabetes mellitus with diabetic neuropathy, unspecified: Secondary | ICD-10-CM | POA: Diagnosis not present

## 2021-02-04 DIAGNOSIS — R509 Fever, unspecified: Secondary | ICD-10-CM | POA: Diagnosis not present

## 2021-02-04 DIAGNOSIS — R5381 Other malaise: Secondary | ICD-10-CM | POA: Diagnosis not present

## 2021-02-04 DIAGNOSIS — J069 Acute upper respiratory infection, unspecified: Secondary | ICD-10-CM | POA: Diagnosis not present

## 2021-02-04 DIAGNOSIS — M6281 Muscle weakness (generalized): Secondary | ICD-10-CM | POA: Diagnosis not present

## 2021-02-04 DIAGNOSIS — E669 Obesity, unspecified: Secondary | ICD-10-CM | POA: Diagnosis present

## 2021-02-04 DIAGNOSIS — D649 Anemia, unspecified: Secondary | ICD-10-CM | POA: Diagnosis not present

## 2021-02-04 DIAGNOSIS — M2550 Pain in unspecified joint: Secondary | ICD-10-CM | POA: Diagnosis not present

## 2021-02-04 DIAGNOSIS — R2681 Unsteadiness on feet: Secondary | ICD-10-CM | POA: Diagnosis not present

## 2021-02-04 DIAGNOSIS — Z743 Need for continuous supervision: Secondary | ICD-10-CM | POA: Diagnosis not present

## 2021-02-04 DIAGNOSIS — Z7401 Bed confinement status: Secondary | ICD-10-CM | POA: Diagnosis not present

## 2021-02-04 DIAGNOSIS — M79605 Pain in left leg: Secondary | ICD-10-CM | POA: Diagnosis not present

## 2021-02-04 DIAGNOSIS — I82442 Acute embolism and thrombosis of left tibial vein: Secondary | ICD-10-CM | POA: Diagnosis not present

## 2021-02-04 DIAGNOSIS — Z888 Allergy status to other drugs, medicaments and biological substances status: Secondary | ICD-10-CM

## 2021-02-04 DIAGNOSIS — I82432 Acute embolism and thrombosis of left popliteal vein: Secondary | ICD-10-CM | POA: Diagnosis present

## 2021-02-04 DIAGNOSIS — M069 Rheumatoid arthritis, unspecified: Secondary | ICD-10-CM | POA: Diagnosis present

## 2021-02-04 DIAGNOSIS — Z7984 Long term (current) use of oral hypoglycemic drugs: Secondary | ICD-10-CM

## 2021-02-04 DIAGNOSIS — Z885 Allergy status to narcotic agent status: Secondary | ICD-10-CM

## 2021-02-04 DIAGNOSIS — R262 Difficulty in walking, not elsewhere classified: Secondary | ICD-10-CM | POA: Diagnosis not present

## 2021-02-04 DIAGNOSIS — Z96611 Presence of right artificial shoulder joint: Secondary | ICD-10-CM | POA: Diagnosis present

## 2021-02-04 DIAGNOSIS — Z6832 Body mass index (BMI) 32.0-32.9, adult: Secondary | ICD-10-CM

## 2021-02-04 DIAGNOSIS — Z03818 Encounter for observation for suspected exposure to other biological agents ruled out: Secondary | ICD-10-CM | POA: Diagnosis not present

## 2021-02-04 DIAGNOSIS — L80 Vitiligo: Secondary | ICD-10-CM | POA: Diagnosis not present

## 2021-02-04 DIAGNOSIS — I82452 Acute embolism and thrombosis of left peroneal vein: Secondary | ICD-10-CM | POA: Diagnosis not present

## 2021-02-04 DIAGNOSIS — E785 Hyperlipidemia, unspecified: Secondary | ICD-10-CM | POA: Diagnosis present

## 2021-02-04 DIAGNOSIS — M1990 Unspecified osteoarthritis, unspecified site: Secondary | ICD-10-CM | POA: Diagnosis not present

## 2021-02-04 DIAGNOSIS — F419 Anxiety disorder, unspecified: Secondary | ICD-10-CM | POA: Diagnosis present

## 2021-02-04 DIAGNOSIS — Z20822 Contact with and (suspected) exposure to covid-19: Secondary | ICD-10-CM | POA: Diagnosis not present

## 2021-02-04 DIAGNOSIS — Z881 Allergy status to other antibiotic agents status: Secondary | ICD-10-CM | POA: Diagnosis not present

## 2021-02-04 DIAGNOSIS — I82422 Acute embolism and thrombosis of left iliac vein: Secondary | ICD-10-CM | POA: Diagnosis not present

## 2021-02-04 DIAGNOSIS — M7989 Other specified soft tissue disorders: Secondary | ICD-10-CM | POA: Diagnosis not present

## 2021-02-04 DIAGNOSIS — Z79899 Other long term (current) drug therapy: Secondary | ICD-10-CM

## 2021-02-04 DIAGNOSIS — R5081 Fever presenting with conditions classified elsewhere: Secondary | ICD-10-CM | POA: Diagnosis not present

## 2021-02-04 DIAGNOSIS — J811 Chronic pulmonary edema: Secondary | ICD-10-CM | POA: Diagnosis not present

## 2021-02-04 DIAGNOSIS — I82412 Acute embolism and thrombosis of left femoral vein: Secondary | ICD-10-CM | POA: Diagnosis not present

## 2021-02-04 DIAGNOSIS — K56609 Unspecified intestinal obstruction, unspecified as to partial versus complete obstruction: Secondary | ICD-10-CM | POA: Diagnosis present

## 2021-02-04 DIAGNOSIS — Z7982 Long term (current) use of aspirin: Secondary | ICD-10-CM

## 2021-02-04 DIAGNOSIS — I82409 Acute embolism and thrombosis of unspecified deep veins of unspecified lower extremity: Secondary | ICD-10-CM | POA: Diagnosis present

## 2021-02-04 DIAGNOSIS — Z452 Encounter for adjustment and management of vascular access device: Secondary | ICD-10-CM

## 2021-02-04 DIAGNOSIS — I82402 Acute embolism and thrombosis of unspecified deep veins of left lower extremity: Secondary | ICD-10-CM

## 2021-02-04 LAB — BASIC METABOLIC PANEL
Anion gap: 9 (ref 5–15)
BUN: 8 mg/dL (ref 8–23)
CO2: 21 mmol/L — ABNORMAL LOW (ref 22–32)
Calcium: 8.6 mg/dL — ABNORMAL LOW (ref 8.9–10.3)
Chloride: 108 mmol/L (ref 98–111)
Creatinine, Ser: 0.52 mg/dL (ref 0.44–1.00)
GFR, Estimated: >60 mL/min (ref >60)
Glucose, Bld: 131 mg/dL — ABNORMAL HIGH (ref 70–99)
Potassium: 3.7 mmol/L (ref 3.5–5.1)
Sodium: 138 mmol/L (ref 135–145)

## 2021-02-04 LAB — CBC
HCT: 37.4 % (ref 36.0–46.0)
Hemoglobin: 12.2 g/dL (ref 12.0–15.0)
MCH: 29.5 pg (ref 26.0–34.0)
MCHC: 32.6 g/dL (ref 30.0–36.0)
MCV: 90.3 fL (ref 80.0–100.0)
Platelets: 157 10*3/uL (ref 150–400)
RBC: 4.14 MIL/uL (ref 3.87–5.11)
RDW: 15.8 % — ABNORMAL HIGH (ref 11.5–15.5)
WBC: 10.4 10*3/uL (ref 4.0–10.5)
nRBC: 0 % (ref 0.0–0.2)

## 2021-02-04 MED ORDER — HEPARIN BOLUS VIA INFUSION
4000.0000 [IU] | Freq: Once | INTRAVENOUS | Status: DC
  Filled 2021-02-04: qty 4000

## 2021-02-04 MED ORDER — PREDNISONE 20 MG PO TABS
40.0000 mg | ORAL_TABLET | Freq: Every day | ORAL | Status: DC
  Administered 2021-02-06 – 2021-02-10 (×5): 40 mg via ORAL
  Filled 2021-02-04 (×5): qty 2

## 2021-02-04 MED ORDER — HYDRALAZINE HCL 20 MG/ML IJ SOLN
5.0000 mg | INTRAMUSCULAR | Status: DC | PRN
  Administered 2021-02-05: 5 mg via INTRAVENOUS
  Filled 2021-02-04: qty 1

## 2021-02-04 MED ORDER — AMLODIPINE BESYLATE 5 MG PO TABS
5.0000 mg | ORAL_TABLET | Freq: Every day | ORAL | Status: DC
  Administered 2021-02-05 – 2021-02-10 (×6): 5 mg via ORAL
  Filled 2021-02-04 (×6): qty 1

## 2021-02-04 MED ORDER — APIXABAN 5 MG PO TABS: 10.0000 mg | ORAL_TABLET | Freq: Two times a day (BID) | ORAL | Status: DC

## 2021-02-04 MED ORDER — ROSUVASTATIN CALCIUM 5 MG PO TABS
10.0000 mg | ORAL_TABLET | Freq: Every day | ORAL | Status: DC
  Administered 2021-02-05 – 2021-02-10 (×6): 10 mg via ORAL
  Filled 2021-02-04 (×6): qty 2

## 2021-02-04 MED ORDER — HEPARIN (PORCINE) 25000 UT/250ML-% IV SOLN
2300.0000 [IU]/h | INTRAVENOUS | Status: DC
  Administered 2021-02-04: 1450 [IU]/h via INTRAVENOUS
  Administered 2021-02-06: 1950 [IU]/h via INTRAVENOUS
  Filled 2021-02-04 (×3): qty 250

## 2021-02-04 MED ORDER — APIXABAN (ELIQUIS) EDUCATION KIT FOR DVT/PE PATIENTS
PACK | Freq: Once | Status: DC
  Filled 2021-02-04: qty 1

## 2021-02-04 MED ORDER — FOLIC ACID 1 MG PO TABS
1.0000 mg | ORAL_TABLET | Freq: Every day | ORAL | Status: DC
  Administered 2021-02-05 – 2021-02-10 (×6): 1 mg via ORAL
  Filled 2021-02-04 (×6): qty 1

## 2021-02-04 MED ORDER — APIXABAN 5 MG PO TABS: 5.0000 mg | ORAL_TABLET | Freq: Two times a day (BID) | ORAL | Status: DC

## 2021-02-04 MED ORDER — GABAPENTIN 300 MG PO CAPS
300.0000 mg | ORAL_CAPSULE | Freq: Two times a day (BID) | ORAL | Status: DC
  Administered 2021-02-05 – 2021-02-10 (×12): 300 mg via ORAL
  Filled 2021-02-04 (×12): qty 1

## 2021-02-04 MED ORDER — HEPARIN BOLUS VIA INFUSION
6000.0000 [IU] | Freq: Once | INTRAVENOUS | Status: AC
  Administered 2021-02-04: 6000 [IU] via INTRAVENOUS
  Filled 2021-02-04: qty 6000

## 2021-02-04 MED ORDER — INSULIN ASPART 100 UNIT/ML ~~LOC~~ SOLN
0.0000 [IU] | SUBCUTANEOUS | Status: DC
  Administered 2021-02-05 (×2): 1 [IU] via SUBCUTANEOUS
  Administered 2021-02-05 – 2021-02-06 (×2): 2 [IU] via SUBCUTANEOUS
  Administered 2021-02-06: 5 [IU] via SUBCUTANEOUS
  Administered 2021-02-06: 2 [IU] via SUBCUTANEOUS
  Administered 2021-02-06: 3 [IU] via SUBCUTANEOUS
  Administered 2021-02-06: 2 [IU] via SUBCUTANEOUS
  Administered 2021-02-07: 3 [IU] via SUBCUTANEOUS
  Administered 2021-02-07: 1 [IU] via SUBCUTANEOUS
  Administered 2021-02-07: 3 [IU] via SUBCUTANEOUS
  Administered 2021-02-07: 7 [IU] via SUBCUTANEOUS
  Administered 2021-02-07 – 2021-02-08 (×2): 5 [IU] via SUBCUTANEOUS
  Administered 2021-02-08: 1 [IU] via SUBCUTANEOUS
  Administered 2021-02-08: 3 [IU] via SUBCUTANEOUS
  Administered 2021-02-08: 1 [IU] via SUBCUTANEOUS
  Administered 2021-02-08: 2 [IU] via SUBCUTANEOUS
  Administered 2021-02-09 (×3): 3 [IU] via SUBCUTANEOUS
  Administered 2021-02-09: 5 [IU] via SUBCUTANEOUS
  Administered 2021-02-09: 2 [IU] via SUBCUTANEOUS
  Administered 2021-02-10: 3 [IU] via SUBCUTANEOUS
  Administered 2021-02-10 (×2): 1 [IU] via SUBCUTANEOUS

## 2021-02-04 MED ORDER — HYDROMORPHONE HCL 1 MG/ML IJ SOLN
0.5000 mg | INTRAMUSCULAR | Status: DC | PRN
  Administered 2021-02-05: 0.5 mg via INTRAVENOUS
  Filled 2021-02-04: qty 1

## 2021-02-04 MED ORDER — METHOTREXATE 2.5 MG PO TABS
20.0000 mg | ORAL_TABLET | ORAL | Status: DC
Start: 2021-02-08 — End: 2021-02-10
  Administered 2021-02-08: 20 mg via ORAL
  Filled 2021-02-04: qty 8

## 2021-02-04 MED ORDER — ACETAMINOPHEN 325 MG PO TABS
650.0000 mg | ORAL_TABLET | Freq: Four times a day (QID) | ORAL | Status: DC | PRN
  Administered 2021-02-05 – 2021-02-06 (×2): 650 mg via ORAL
  Filled 2021-02-04 (×2): qty 2

## 2021-02-04 MED ORDER — HYDROXYCHLOROQUINE SULFATE 200 MG PO TABS
200.0000 mg | ORAL_TABLET | Freq: Every day | ORAL | Status: DC
  Administered 2021-02-05 – 2021-02-08 (×4): 200 mg via ORAL
  Filled 2021-02-04 (×3): qty 1

## 2021-02-04 MED ORDER — DULOXETINE HCL 30 MG PO CPEP
30.0000 mg | ORAL_CAPSULE | Freq: Every day | ORAL | Status: DC
  Administered 2021-02-05 – 2021-02-10 (×6): 30 mg via ORAL
  Filled 2021-02-04 (×6): qty 1

## 2021-02-04 MED ORDER — ACETAMINOPHEN 650 MG RE SUPP: 650.0000 mg | Freq: Four times a day (QID) | RECTAL | Status: DC | PRN

## 2021-02-04 NOTE — ED Triage Notes (Signed)
Pt reports that she is having left leg swelling . Pt reports she is having a hard time walking. Pt reports the swelling started yesterday morning. Pt denies any cp,sob.

## 2021-02-04 NOTE — Progress Notes (Addendum)
Addendum  - Decision from vascular to admit and possible procedure will switch to heparin at this time  - Heparin bolus 6000 units IV x 1 dose  - Followed by Heparin 1450 units/hr  - HL in ~ 6 hours  - Monitor for s/s of bleeding and cbc while on hep   ANTICOAGULATION CONSULT NOTE - Follow Up Consult  Pharmacy Consult for Apixaban Indication: DVT  Allergies  Allergen Reactions  . Cephalexin Other (See Comments)    URINARY RETENTION = "blocked my urine"  . Codeine Rash    Other reaction(s): hives  . Nucynta [Tapentadol Hcl] Other (See Comments)    Altered mental status    Patient Measurements:   Heparin Dosing Weight:   Vital Signs: Temp: 98.7 F (37.1 C) (04/06 1631) Temp Source: Oral (04/06 1631) BP: 179/86 (04/06 1730) Pulse Rate: 73 (04/06 1730)  Labs: No results for input(s): HGB, HCT, PLT, APTT, LABPROT, INR, HEPARINUNFRC, HEPRLOWMOCWT, CREATININE, CKTOTAL, CKMB, TROPONINIHS in the last 72 hours.  Estimated Creatinine Clearance: 75.7 mL/min (by C-G formula based on SCr of 0.6 mg/dL).   Medications:  (Not in a hospital admission)  Scheduled:  . apixaban   Does not apply Once  . apixaban  10 mg Oral BID   Followed by  . [START ON 02/11/2021] apixaban  5 mg Oral BID    Assessment: Patient is a 86 yof that pharmacy has been asked to dose Apixaban for a new onset DVT. Patient does not appear to be on a blood thinner at home. No signs of bleeding cbc not collected at this time.   Goal of Therapy:  Monitor platelets by anticoagulation protocol: Yes   Plan:  - Apixaban 10 mg PO BID x 7 days  - Followed by Apixaban 5 mg PO BID  - Monitor patient for s/s of bleeding and cbc  - Will f/u discharge plans for   Duanne Limerick PharmD. BCPS  02/04/2021,5:54 PM

## 2021-02-04 NOTE — Progress Notes (Signed)
Left lower extremity venous study completed.     Spoke to [employer] RN   Please see CV Proc for preliminary results.   Vonzell Schlatter, RVT
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
## 2021-02-04 NOTE — ED Triage Notes (Signed)
Emergency Medicine Provider Triage Evaluation Note  Diane [name] , a 75 y.o. female  was evaluated in triage.  Pt complains of left leg pain and swelling, recently dc from hospital on Friday, unable to bear weight on leg due to pain.  Review of Systems  Positive: Left leg pain, swelling, left low back pain Negative: SHOB  Physical Exam  BP (!) 157/78 (BP Location: Left Arm)   Pulse 74   Temp 98.8 F (37.1 C)   Resp 18   SpO2 98%  Gen:   Awake, no distress   HEENT:  Atraumatic  Resp:  Normal effort  Cardiac:  Normal rate  Abd:   Nondistended, nontender  MSK:   Moves extremities without difficulty, edema to left leg with diffuse tenderness, sensation intact, DP pulse present Neuro:  Speech clear   Medical Decision Making  Medically screening exam initiated at 11:34 AM.  Appropriate orders placed.  [name] was informed that the remainder of the evaluation will be completed by another provider, this initial triage assessment does not replace that evaluation, and the importance of remaining in the ED until their evaluation is complete.  Clinical Impression     Tacy Learn, PA-C 02/04/21 1135

## 2021-02-04 NOTE — ED Notes (Signed)
EDP ok's holding on IV and labs for the moment
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
## 2021-02-04 NOTE — ED Notes (Signed)
Phleb at bedside  

## 2021-02-04 NOTE — ED Notes (Signed)
Unable to obtain blood at this time.

## 2021-02-04 NOTE — H&P (View-Only) (Signed)
Vascular and Vein Specialist of [facility]  [name] MRN: [id number] DOB: Mar 17, 1946 Sex: female   REQUESTING PROVIDER:    ER   REASON FOR CONSULT:    Left leg DVT  HISTORY OF PRESENT ILLNESS:   Diane [name] is a 75 y.o. female, who i presented to the emergency department on 02/04/2021 with pain and swelling in her left leg that began several days ago.  She denies any prior history of DVT or PE.  There is no family history of clotting disorder.  She has been relatively inactive the past few days from her recent hospitalization.  She was recently discharged from the hospital for a small bowel obstruction that was treated nonoperatively.  This was felt to be secondary to either adhesions from her hysterectomy, laparoscopic cholecystectomy, or her umbilical hernia repair.  She did have inflammation at the cecum with a possible stricture at the terminal ileum versus edema or inflammation.  She was started on steroids and improved.  The patient suffers from diabetes.  She is a non-smoker.  She is medically managed for hypertension.  She has a history of prolonged QT syndrome  PAST MEDICAL HISTORY    Past Medical History:  Diagnosis Date  . Abnormal nuclear stress test 10/18/2013  . Arm pain 10/18/2013  . Arthritis   . Cataract    RIGHT EYE  . Chest pain 10/18/2013  . Diabetes mellitus    dx over 5 yrs ago  . GERD (gastroesophageal reflux disease)   . Heart murmur    "yrs ago in [place] -- been here since 1999, "  . History of hiatal hernia   . Hypertension   . Prolonged Q-T interval on ECG 04/20/2017  . Pulmonary edema 04/20/2017     FAMILY HISTORY   Family History  Family history unknown: Yes    SOCIAL HISTORY:   Social History   Socioeconomic History  . Marital status: Widowed    Spouse name: Not on file  . Number of children: 3  . Years of education: Not on file  . Highest education level: Some college, no  degree  Occupational History  . Not on file  Tobacco Use  . Smoking status: Never Smoker  . Smokeless tobacco: Never Used  Vaping Use  . Vaping Use: Never used  Substance and Sexual Activity  . Alcohol use: Never  . Drug use: Never  . Sexual activity: Not Currently  Other Topics Concern  . Not on file  Social History Narrative   Lives with daughter   Social Determinants of Health   Financial Resource Strain: Not on file  Food Insecurity: Not on file  Transportation Needs: Not on file  Physical Activity: Not on file  Stress: Not on file  Social Connections: Not on file  Intimate Partner Violence: Not on file    ALLERGIES:    Allergies  Allergen Reactions  . Cephalexin Other (See Comments)    URINARY RETENTION = "blocked my urine"  . Codeine Rash    Other reaction(s): hives  . Nucynta [Tapentadol Hcl] Other (See Comments)    Altered mental status    CURRENT MEDICATIONS:    Current Facility-Administered Medications  Medication Dose Route Frequency Provider Last Rate Last Admin  . heparin ADULT infusion 100 units/mL (25000 units/236mL)  1,450 Units/hr Intravenous Continuous Duanne Limerick, RPH 14.5 mL/hr at 02/04/21 2005 1,450 Units/hr at 02/04/21 2005   Current Outpatient Medications  Medication Sig Dispense Refill  . acetaminophen (  TYLENOL) 500 MG tablet Take 1 tablet (500 mg total) by mouth every 6 (six) hours as needed. (Patient taking differently: Take 500 mg by mouth every 6 (six) hours as needed for mild pain or headache.) 30 tablet 0  . amLODipine (NORVASC) 5 MG tablet Take 5 mg by mouth daily.    Marland Kitchen aspirin 81 MG chewable tablet Chew 81 mg by mouth daily.    . clotrimazole-betamethasone (LOTRISONE) cream Apply 1 application topically in the morning and at bedtime.    . DULoxetine (CYMBALTA) 30 MG capsule Take 30 mg by mouth daily.    . folic acid (FOLVITE) 1 MG tablet Take 1 mg by mouth daily.    Marland Kitchen gabapentin (NEURONTIN) 300 MG capsule Take 300 mg by mouth 2  (two) times daily.     . metFORMIN (GLUCOPHAGE) 500 MG tablet Take 1,000 mg by mouth daily.    . methotrexate (RHEUMATREX) 2.5 MG tablet Take 20 mg by mouth once a week. 8 tablets weekly (Sunday)  3  . nystatin cream (MYCOSTATIN) Apply to affected area 2 times daily. groin (Patient taking differently: Apply 1 application topically 2 (two) times daily as needed for dry skin (groin).) 15 g 0  . methocarbamol (ROBAXIN) 500 MG tablet Take 1 tablet (500 mg total) by mouth 3 (three) times daily as needed. (Patient not taking: No sig reported) 60 tablet 1  . predniSONE (DELTASONE) 10 MG tablet Take 4 tablets (40 mg total) by mouth daily with breakfast. 120 tablet 0  . saccharomyces boulardii (FLORASTOR) 250 MG capsule Take 1 capsule (250 mg total) by mouth 2 (two) times daily. (Patient not taking: No sig reported) 60 capsule 0    REVIEW OF SYSTEMS:   [X]  denotes positive finding, [ ]  denotes negative finding Cardiac  Comments:  Chest pain or chest pressure:    Shortness of breath upon exertion:    Short of breath when lying flat:    Irregular heart rhythm:        Vascular    Pain in calf, thigh, or hip brought on by ambulation:    Pain in feet at night that wakes you up from your sleep:     Blood clot in your veins:    Leg swelling:  x       Pulmonary    Oxygen at home:    Productive cough:     Wheezing:         Neurologic    Sudden weakness in arms or legs:     Sudden numbness in arms or legs:     Sudden onset of difficulty speaking or slurred speech:    Temporary loss of vision in one eye:     Problems with dizziness:         Gastrointestinal    Blood in stool:      Vomited blood:         Genitourinary    Burning when urinating:     Blood in urine:        Psychiatric    Major depression:         Hematologic    Bleeding problems:    Problems with blood clotting too easily:        Skin    Rashes or ulcers:        Constitutional    Fever or chills:     PHYSICAL EXAM:    Vitals:   02/04/21 1730 02/04/21 1800 02/04/21 1855 02/04/21 2045  BP: (!) 179/86  Marland Kitchen)  176/85 (!) 168/89  Pulse: 73  71 86  Resp: 11  19 14   Temp:      TempSrc:      SpO2: 96%  99% 98%  Weight:  98.4 kg    Height:  5\' 8"  (1.727 m)      GENERAL: The patient is a well-nourished female, in no acute distress. The vital signs are documented above. CARDIAC: There is a regular rate and rhythm.  VASCULAR: Significant left leg swelling.  Palpable left dorsalis pedis pulse PULMONARY: Nonlabored respirations ABDOMEN: Soft and non-tender with normal pitched bowel sounds.  MUSCULOSKELETAL: Tenderness to deep touch of the left leg without signs of compartment syndrome NEUROLOGIC: Normal motor function of the left foot.  Slightly decreased sensation SKIN: There are no ulcers or rashes noted. PSYCHIATRIC: The patient has a normal affect.  STUDIES:   I have reviewed the venous doppler with the following results: LEFT:  - Findings consistent with acute deep vein thrombosis involving the left  common femoral vein, SF junction, left femoral vein, left proximal  profunda vein, left popliteal vein, left posterior tibial veins, and left  peroneal veins.   ASSESSMENT and PLAN   Left leg DVT: The patient has extensive swelling of her left leg secondary to a DVT.  At this point she will be started on IV heparin.  She will keep her leg elevated.  We will see how she does overnight.  I suspect with the degree of swelling in the extensive nature of her clot, that she will need to proceed with mechanical thrombectomy.  I will try to arrange this for this week.  I will check a ileocaval duplex tomorrow after she has been n.p.o.   Leia Alf, MD, FACS [facility] Specialists of [facility] [phone] Pager [phone]

## 2021-02-05 ENCOUNTER — Inpatient Hospital Stay (HOSPITAL_COMMUNITY): Payer: Medicare Other

## 2021-02-05 DIAGNOSIS — I82409 Acute embolism and thrombosis of unspecified deep veins of unspecified lower extremity: Secondary | ICD-10-CM

## 2021-02-05 LAB — HEPARIN LEVEL (UNFRACTIONATED)
Heparin Unfractionated: <0.1 IU/mL — ABNORMAL LOW (ref 0.30–0.70)
Heparin Unfractionated: <0.1 IU/mL — ABNORMAL LOW (ref 0.30–0.70)

## 2021-02-05 LAB — CBG MONITORING, ED
Glucose-Capillary: 137 mg/dL — ABNORMAL HIGH (ref 70–99)
Glucose-Capillary: 143 mg/dL — ABNORMAL HIGH (ref 70–99)
Glucose-Capillary: 95 mg/dL (ref 70–99)
Glucose-Capillary: 98 mg/dL (ref 70–99)

## 2021-02-05 LAB — BASIC METABOLIC PANEL
Anion gap: 9 (ref 5–15)
BUN: 10 mg/dL (ref 8–23)
CO2: 21 mmol/L — ABNORMAL LOW (ref 22–32)
Calcium: 8.6 mg/dL — ABNORMAL LOW (ref 8.9–10.3)
Chloride: 108 mmol/L (ref 98–111)
Creatinine, Ser: 0.72 mg/dL (ref 0.44–1.00)
GFR, Estimated: >60 mL/min (ref >60)
Glucose, Bld: 214 mg/dL — ABNORMAL HIGH (ref 70–99)
Potassium: 5.1 mmol/L (ref 3.5–5.1)
Sodium: 138 mmol/L (ref 135–145)

## 2021-02-05 LAB — GLUCOSE, CAPILLARY
Glucose-Capillary: 148 mg/dL — ABNORMAL HIGH (ref 70–99)
Glucose-Capillary: 162 mg/dL — ABNORMAL HIGH (ref 70–99)
Glucose-Capillary: 189 mg/dL — ABNORMAL HIGH (ref 70–99)

## 2021-02-05 LAB — SARS CORONAVIRUS 2 (TAT 6-24 HRS): SARS Coronavirus 2: NEGATIVE

## 2021-02-05 LAB — PROTIME-INR
INR: 1.2 (ref 0.8–1.2)
Prothrombin Time: 14.6 seconds (ref 11.4–15.2)

## 2021-02-05 MED ORDER — HEPARIN BOLUS VIA INFUSION
2500.0000 [IU] | Freq: Once | INTRAVENOUS | Status: AC
  Administered 2021-02-05: 2500 [IU] via INTRAVENOUS
  Filled 2021-02-05: qty 2500

## 2021-02-05 MED ORDER — HEPARIN BOLUS VIA INFUSION
3000.0000 [IU] | Freq: Once | INTRAVENOUS | Status: AC
  Administered 2021-02-05: 3000 [IU] via INTRAVENOUS
  Filled 2021-02-05: qty 3000

## 2021-02-05 NOTE — ED Notes (Signed)
4 attempts to get lab work by 2 individuals all unsuccessful.  Pt states she is normally a hard stick

## 2021-02-05 NOTE — Progress Notes (Signed)
@   2115 Dr. Tonie Griffith, on-call for attending, paged regarding IV Team RN's recommendation that pt have PICC placed 2/2 poor PIV access possibilities. Previous RUA PIV infiltrated after shift change and current IV (L AC) in last available vein per IV Team RN.  Page promptly returned and MD placed order for possible PICC placement tomorrow. Will convey to Day Team and continue to monitor.

## 2021-02-05 NOTE — Progress Notes (Signed)
Called by RN who stated pt has tenuous IV access and IV team stated would need PICC line placed tomorrow so order needs to be placed tonight.  Has IV access now for heparin which is infusing.  PICC order by IV team consult placed in case is needed in am

## 2021-02-05 NOTE — Progress Notes (Signed)
Mobility Specialist: Progress Note   02/05/21 1455  Mobility  Activity  (Assisted RN in transfer of pt to new bed in room.)   [place] Specialist Mobility Specialist Phone: [phone]

## 2021-02-05 NOTE — Progress Notes (Signed)
  [place] for Heparin Indication: DVT  Allergies  Allergen Reactions  . Cephalexin Other (See Comments)    URINARY RETENTION = "blocked my urine"  . Codeine Rash    Other reaction(s): hives  . Nucynta [Tapentadol Hcl] Other (See Comments)    Altered mental status    Patient Measurements: Height: 5\' 8"  (172.7 cm) Weight: 98.4 kg (216 lb 14.9 oz) IBW/kg (Calculated) : 63.9 Heparin Dosing Weight:   Vital Signs: Temp: 98.6 F (37 C) (04/07 0343) Temp Source: Oral (04/07 0343) BP: 139/75 (04/07 0245) Pulse Rate: 91 (04/07 0245)  Labs: Recent Labs    02/04/21 2044 02/05/21 0248  HGB 12.2  --   HCT 37.4  --   PLT 157  --   LABPROT  --  14.6  INR  --  1.2  HEPARINUNFRC  --  <0.10*  CREATININE 0.52  --     Estimated Creatinine Clearance: 75.7 mL/min (by C-G formula based on SCr of 0.52 mg/dL).   Medications:  (Not in a hospital admission)  Scheduled:  . amLODipine  5 mg Oral Daily  . DULoxetine  30 mg Oral Daily  . folic acid  1 mg Oral Daily  . gabapentin  300 mg Oral BID  . heparin  3,000 Units Intravenous Once  . hydroxychloroquine  200 mg Oral Daily  . insulin aspart  0-9 Units Subcutaneous Q4H  . [START ON 02/08/2021] methotrexate  20 mg Oral Q Sun  . predniSONE  40 mg Oral Q breakfast  . rosuvastatin  10 mg Oral Daily    Assessment: Patient is a 30 yof that pharmacy has been asked to dose Heparin for a new onset DVT. Patient does not appear to be on a blood thinner at home. No signs of bleeding cbc not collected at this time.   4/7 AM update:  Heparin level undetectable May get thrombectomy this week  Goal of Therapy:  Heparin level 0.3-0.7 units/mL Monitor platelets by anticoagulation protocol: Yes   Plan:  -Heparin 3000 units re-bolus -Inc heparin to 1650 units/hr -1300 heparin level  Narda Bonds, PharmD, BCPS Clinical Pharmacist Phone: [phone]

## 2021-02-05 NOTE — Progress Notes (Signed)
    Subjective  - HD#2  No significant changes overnight.  She is still complaining of left leg swelling and discomfort.   Physical Exam:  Significant left leg edema Palpable left dorsalis pedis pulse       Assessment/Plan:    Left leg DVT: Ileal caval duplex to be performed today.  She can eat once the study has been completed.  I discussed proceeding with left leg mechanical thrombectomy tomorrow via a popliteal approach.  She should remain on IV heparin.  This does not need to be discontinued for her procedure.  I discussed the details of the procedure.  All questions were answered.  She will be n.p.o. after midnight.  Wells Jaymison Luber 02/05/2021 10:16 AM --  Vitals:   02/05/21 0400 02/05/21 0700  BP: 139/79 (!) 147/84  Pulse: 87 88  Resp: 12 20  Temp:    SpO2: 95% 97%   No intake or output data in the 24 hours ending 02/05/21 1016   Laboratory CBC    Component Value Date/Time   WBC 10.4 02/04/2021 2044   HGB 12.2 02/04/2021 2044   HCT 37.4 02/04/2021 2044   PLT 157 02/04/2021 2044    BMET    Component Value Date/Time   NA 138 02/04/2021 2044   NA 145 06/19/2018 0000   K 3.7 02/04/2021 2044   CL 108 02/04/2021 2044   CO2 21 (L) 02/04/2021 2044   GLUCOSE 131 (H) 02/04/2021 2044   BUN 8 02/04/2021 2044   BUN 7 06/19/2018 0000   CREATININE 0.52 02/04/2021 2044   CALCIUM 8.6 (L) 02/04/2021 2044   GFRNONAA >60 02/04/2021 2044   GFRAA >60 12/09/2019 2243    COAG Lab Results  Component Value Date   INR 1.2 02/05/2021   INR 1.13 04/20/2017   No results found for: PTT  Antibiotics Anti-infectives (From admission, onward)   Start     Dose/Rate Route Frequency Ordered Stop   02/05/21 1000  hydroxychloroquine (PLAQUENIL) tablet 200 mg        200 mg Oral Daily 02/04/21 2252         V. Leia Alf, M.D., [facility] Vascular and Vein Specialists of [place] Office: [phone] Pager:  [phone]

## 2021-02-05 NOTE — Progress Notes (Signed)
Pt arrived to room alert and oriented x 4, no complaints of pain except for back related to stretcher.  CHG bath performed, all monitors applied, CCMD notified and VS stable.  Pt has not eaten for more than a day.  Msg sent to MD to request diet order.  Pt oriented to room and equipment,  Ice water given and daughter at bedside.  Will cont plan of care

## 2021-02-05 NOTE — H&P (View-Only) (Signed)
    Subjective  - HD#2  No significant changes overnight.  She is still complaining of left leg swelling and discomfort.   Physical Exam:  Significant left leg edema Palpable left dorsalis pedis pulse       Assessment/Plan:    Left leg DVT: Ileal caval duplex to be performed today.  She can eat once the study has been completed.  I discussed proceeding with left leg mechanical thrombectomy tomorrow via a popliteal approach.  She should remain on IV heparin.  This does not need to be discontinued for her procedure.  I discussed the details of the procedure.  All questions were answered.  She will be n.p.o. after midnight.  Wells Jessey Stehlin 02/05/2021 10:16 AM --  Vitals:   02/05/21 0400 02/05/21 0700  BP: 139/79 (!) 147/84  Pulse: 87 88  Resp: 12 20  Temp:    SpO2: 95% 97%   No intake or output data in the 24 hours ending 02/05/21 1016   Laboratory CBC    Component Value Date/Time   WBC 10.4 02/04/2021 2044   HGB 12.2 02/04/2021 2044   HCT 37.4 02/04/2021 2044   PLT 157 02/04/2021 2044    BMET    Component Value Date/Time   NA 138 02/04/2021 2044   NA 145 06/19/2018 0000   K 3.7 02/04/2021 2044   CL 108 02/04/2021 2044   CO2 21 (L) 02/04/2021 2044   GLUCOSE 131 (H) 02/04/2021 2044   BUN 8 02/04/2021 2044   BUN 7 06/19/2018 0000   CREATININE 0.52 02/04/2021 2044   CALCIUM 8.6 (L) 02/04/2021 2044   GFRNONAA >60 02/04/2021 2044   GFRAA >60 12/09/2019 2243    COAG Lab Results  Component Value Date   INR 1.2 02/05/2021   INR 1.13 04/20/2017   No results found for: PTT  Antibiotics Anti-infectives (From admission, onward)   Start     Dose/Rate Route Frequency Ordered Stop   02/05/21 1000  hydroxychloroquine (PLAQUENIL) tablet 200 mg        200 mg Oral Daily 02/04/21 2252         V. Leia Alf, M.D., [facility] Vascular and Vein Specialists of [place] Office: [phone] Pager:  [phone]

## 2021-02-05 NOTE — Progress Notes (Signed)
Pt. Assessed for possible midline, unable to find suitable vein to use for midline, RN at bedside aware.  Right upper arm found to be swollen with current PIV and infusing med.after assessing both arms for possible midline access, this IV Vast nurse informed RN who was still at bedside that pt. Is a candidate for CVL placement and to notify MD so the order can before midnight .

## 2021-02-05 NOTE — Progress Notes (Addendum)
PROGRESS NOTE    Diane [name]  BOF:[id number] DOB: [birth year] DOA: 02/04/2021 PCP: [name], MD   Brief Narrative: 75 year old with past medical history significant for hypertension, noninsulin-dependent type 2 diabetes, rheumatoid arthritis, anxiety, depression, mild cognitive deficit, recent hospitalization from 3/24 2022 on 12/02/2020 for partial SBO which was treated nonoperatively presented to the ED complaining of left leg pain and swelling.  Evaluation in the ED Doppler of ER DVT morphine left common femoral vein, saphenous junction, left femoral vein, left proximal profunda vein, left popliteal vein, left posterior tibial veins and left peroneal veins.  Patient was a started on IV heparin and vascular surgery was consulted.  Plan is for thrombectomy tomorrow.   Assessment & Plan:   Principal Problem:   Acute DVT (deep venous thrombosis) (HCC) Active Problems:   Hypertension   Type 2 diabetes mellitus with diabetic neuropathy, unspecified (HCC)   SBO (small bowel obstruction) (HCC)   Rheumatoid arthritis (HCC)   1-Acute extensive DVT of left lower extremity: Doppler: DVT involving left common femoral vein, SF junction, left femoral vein, left proximal profunda vein, left popliteal vein, left posterior tibial veins, left peroneal veins. -Risk factors: Obesity, immobilization recent hospitalization.  Plan to check hypercoagulable panel. -Continue with heparin drip -Vascular consulted, plan for thrombectomy tomorrow  2-Hypertension: Continue with amlodipine and as needed hydralazine  Noninsulin-dependent diabetes type 2, with diabetic neuropathy: Hemoglobin A1c 7.3 on 01/22/2021 Treated with sliding scale insulin  Rheumatoid arthritis: Continue with methotrexate and Plaquenil  Anxiety, depression: Continue with Cymbalta  Hyperlipidemia: Continue with Crestor  Recent partial SBO: Recently admitted for partial SBO etiology thought to be due to stricture versus  inflammation.  She was a started on prednisone 40 mg daily and plan is to follow-up with Dr. Collene Mares who will determine further  taper.     Estimated body mass index is 32.98 kg/m as calculated from the following:   Height as of this encounter: 5\' 8"  (1.727 m).   Weight as of this encounter: 98.4 kg.   DVT prophylaxis: Heparin gtt Code Status: Full code Family Communication: care discussed with patient, daughter updated Disposition Plan:  Status is: Inpatient  Remains inpatient appropriate because:IV treatments appropriate due to intensity of illness or inability to take PO   Dispo: The patient is from: Home              Anticipated d/c is to: Home              Patient currently is not medically stable to d/c.   Difficult to place patient No        Consultants:   Vascular  Procedures:  Doppler  Antimicrobials:    Subjective: She is complaining of left leg pain.  Denies dyspnea.   Objective: Vitals:   02/05/21 0245 02/05/21 0343 02/05/21 0400 02/05/21 0700  BP: 139/75  139/79 (!) 147/84  Pulse: 91  87 88  Resp: 13  12 20   Temp:  98.6 F (37 C)    TempSrc:  Oral    SpO2: 94%  95% 97%  Weight:      Height:       No intake or output data in the 24 hours ending 02/05/21 0743 Filed Weights   02/04/21 1800  Weight: 98.4 kg    Examination:  General exam: Appears calm and comfortable  Respiratory system: Clear to auscultation. Respiratory effort normal. Cardiovascular system: S1 & S2 heard, RRR. No JVD, murmurs, rubs, gallops or clicks. No pedal edema. Gastrointestinal  system: Abdomen is nondistended, soft and nontender. No organomegaly or masses felt. Normal bowel sounds heard. Central nervous system: Alert and oriented. No focal neurological deficits. Extremities: Symmetric 5 x 5 power. Skin: No rashes, lesions or ulcers Psychiatry: Judgement and insight appear normal. Mood & affect appropriate.     Data Reviewed: I have personally reviewed following  labs and imaging studies  CBC: Recent Labs  Lab 02/04/21 2044  WBC 10.4  HGB 12.2  HCT 37.4  MCV 90.3  PLT 350   Basic Metabolic Panel: Recent Labs  Lab 01/30/21 0231 01/30/21 1407 02/04/21 2044  NA 139  --  138  K 2.9* 3.8 3.7  CL 108  --  108  CO2 25  --  21*  GLUCOSE 103*  --  131*  BUN 10  --  8  CREATININE 0.60  --  0.52  CALCIUM 8.6*  --  8.6*  MG 1.8  --   --    GFR: Estimated Creatinine Clearance: 75.7 mL/min (by C-G formula based on SCr of 0.52 mg/dL). Liver Function Tests: No results for input(s): AST, ALT, ALKPHOS, BILITOT, PROT, ALBUMIN in the last 168 hours. No results for input(s): LIPASE, AMYLASE in the last 168 hours. No results for input(s): AMMONIA in the last 168 hours. Coagulation Profile: Recent Labs  Lab 02/05/21 0248  INR 1.2   Cardiac Enzymes: No results for input(s): CKTOTAL, CKMB, CKMBINDEX, TROPONINI in the last 168 hours. BNP (last 3 results) No results for input(s): PROBNP in the last 8760 hours. HbA1C: No results for input(s): HGBA1C in the last 72 hours. CBG: Recent Labs  Lab 01/30/21 0018 01/30/21 0528 01/30/21 1208 02/05/21 0025 02/05/21 0342  GLUCAP 127* 87 227* 137* 143*   Lipid Profile: No results for input(s): CHOL, HDL, LDLCALC, TRIG, CHOLHDL, LDLDIRECT in the last 72 hours. Thyroid Function Tests: No results for input(s): TSH, T4TOTAL, FREET4, T3FREE, THYROIDAB in the last 72 hours. Anemia Panel: No results for input(s): VITAMINB12, FOLATE, FERRITIN, TIBC, IRON, RETICCTPCT in the last 72 hours. Sepsis Labs: No results for input(s): PROCALCITON, LATICACIDVEN in the last 168 hours.  Recent Results (from the past 240 hour(s))  SARS CORONAVIRUS 2 (TAT 6-24 HRS) Nasopharyngeal Nasopharyngeal Swab     Status: None   Collection Time: 02/04/21  6:03 PM   Specimen: Nasopharyngeal Swab  Result Value Ref Range Status   SARS Coronavirus 2 NEGATIVE NEGATIVE Final    Comment: (NOTE) SARS-CoV-2 target nucleic acids are  NOT DETECTED.  The SARS-CoV-2 RNA is generally detectable in upper and lower respiratory specimens during the acute phase of infection. Negative results do not preclude SARS-CoV-2 infection, do not rule out co-infections with other pathogens, and should not be used as the sole basis for treatment or other patient management decisions. Negative results must be combined with clinical observations, patient history, and epidemiological information. The expected result is Negative.  Fact Sheet for Patients: SugarRoll.be  Fact Sheet for Healthcare Providers: https://www.woods-mathews.com/  This test is not yet approved or cleared by the [place] FDA and  has been authorized for detection and/or diagnosis of SARS-CoV-2 by FDA under an Emergency Use Authorization (EUA). This EUA will remain  in effect (meaning this test can be used) for the duration of the COVID-19 declaration under Se ction 564(b)(1) of the Act, 21 U.S.C. section 360bbb-3(b)(1), unless the authorization is terminated or revoked sooner.  Performed at [facility], [place] [address] [place], [place] [address]  Radiology Studies: VAS [place] LOWER EXTREMITY VENOUS (DVT) ([facility] and [facility] 7a-7p)  Result Date: 02/04/2021  Lower Venous DVT Study Indications: Pain, and Swelling.  Risk Factors: None identified. Comparison Study: Previous 8/21 Neg Performing Technologist: Vonzell Schlatter RVT  Examination Guidelines: A complete evaluation includes B-mode imaging, spectral Doppler, color Doppler, and power Doppler as needed of all accessible portions of each vessel. Bilateral testing is considered an integral part of a complete examination. Limited examinations for reoccurring indications may be performed as noted. The reflux portion of the exam is performed with the patient in reverse Trendelenburg.  +---------+---------------+---------+-----------+----------+--------------+ LEFT      CompressibilityPhasicitySpontaneityPropertiesThrombus Aging +---------+---------------+---------+-----------+----------+--------------+ CFV      None                                         Acute          +---------+---------------+---------+-----------+----------+--------------+ SFJ      None                                         Acute          +---------+---------------+---------+-----------+----------+--------------+ FV Prox  None                                         Acute          +---------+---------------+---------+-----------+----------+--------------+ FV Mid   None                                         Acute          +---------+---------------+---------+-----------+----------+--------------+ FV DistalNone                                         Acute          +---------+---------------+---------+-----------+----------+--------------+ PFV      None                                         Acute          +---------+---------------+---------+-----------+----------+--------------+ POP      None                                         Acute          +---------+---------------+---------+-----------+----------+--------------+ PTV      None                                         Acute          +---------+---------------+---------+-----------+----------+--------------+ PERO     None  Acute          +---------+---------------+---------+-----------+----------+--------------+  Summary: LEFT: - Findings consistent with acute deep vein thrombosis involving the left common femoral vein, SF junction, left femoral vein, left proximal profunda vein, left popliteal vein, left posterior tibial veins, and left peroneal veins.  *See table(s) above for measurements and observations. Electronically signed by Harold Barban MD on 02/04/2021 at 9:58:10 PM.    Final         Scheduled Meds: . amLODipine  5 mg Oral  Daily  . DULoxetine  30 mg Oral Daily  . folic acid  1 mg Oral Daily  . gabapentin  300 mg Oral BID  . hydroxychloroquine  200 mg Oral Daily  . insulin aspart  0-9 Units Subcutaneous Q4H  . [START ON 02/08/2021] methotrexate  20 mg Oral Q Sun  . predniSONE  40 mg Oral Q breakfast  . rosuvastatin  10 mg Oral Daily   Continuous Infusions: . heparin 1,650 Units/hr (02/05/21 0435)     LOS: 1 day    Time spent: 35 minutes.     Elmarie Shiley, MD [facility]   If 7PM-7AM, please contact night-coverage [email or web address]  02/05/2021, 7:43 AM

## 2021-02-05 NOTE — Progress Notes (Signed)
[facility] for Heparin Indication: DVT  Allergies  Allergen Reactions  . Cephalexin Other (See Comments)    URINARY RETENTION = "blocked my urine"  . Codeine Rash    Other reaction(s): hives  . Nucynta [Tapentadol Hcl] Other (See Comments)    Altered mental status    Patient Measurements: Height: 5\' 8"  (172.7 cm) Weight: 98.4 kg (216 lb 14.9 oz) IBW/kg (Calculated) : 63.9 Heparin Dosing Weight: 85.4  Vital Signs: Temp: 97.3 F (36.3 C) (04/07 1633) Temp Source: Oral (04/07 1633) BP: 146/77 (04/07 1633) Pulse Rate: 91 (04/07 1633)  Labs: Recent Labs    02/04/21 2044 02/05/21 0248 02/05/21 1527  HGB 12.2  --   --   HCT 37.4  --   --   PLT 157  --   --   LABPROT  --  14.6  --   INR  --  1.2  --   HEPARINUNFRC  --  <0.10* <0.10*  CREATININE 0.52  --   --     Estimated Creatinine Clearance: 75.7 mL/min (by C-G formula based on SCr of 0.52 mg/dL).   Medications:  Medications Prior to Admission  Medication Sig Dispense Refill Last Dose  . acetaminophen (TYLENOL) 500 MG tablet Take 1 tablet (500 mg total) by mouth every 6 (six) hours as needed. (Patient taking differently: Take 500 mg by mouth every 6 (six) hours as needed for mild pain or headache.) 30 tablet 0 01/21/2021  . amLODipine (NORVASC) 5 MG tablet Take 5 mg by mouth daily.   01/13/2021  . aspirin 81 MG chewable tablet Chew 81 mg by mouth daily.   01/13/2021  . clotrimazole-betamethasone (LOTRISONE) cream Apply 1 application topically in the morning and at bedtime.   01/14/2021  . DULoxetine (CYMBALTA) 30 MG capsule Take 30 mg by mouth daily.   01/13/2021  . folic acid (FOLVITE) 1 MG tablet Take 1 mg by mouth daily.   01/13/2021  . gabapentin (NEURONTIN) 300 MG capsule Take 300 mg by mouth 2 (two) times daily.    01/13/2021  . metFORMIN (GLUCOPHAGE) 500 MG tablet Take 1,000 mg by mouth daily.   01/13/2021  . methotrexate (RHEUMATREX) 2.5 MG tablet Take 20 mg by mouth once a  week. 8 tablets weekly (Sunday)  3 01/13/2021  . nystatin cream (MYCOSTATIN) Apply to affected area 2 times daily. groin (Patient taking differently: Apply 1 application topically 2 (two) times daily as needed for dry skin (groin).) 15 g 0 01/30/2021  . methocarbamol (ROBAXIN) 500 MG tablet Take 1 tablet (500 mg total) by mouth 3 (three) times daily as needed. (Patient not taking: No sig reported) 60 tablet 1 Not Taking at Unknown time  . predniSONE (DELTASONE) 10 MG tablet Take 4 tablets (40 mg total) by mouth daily with breakfast. 120 tablet 0   . saccharomyces boulardii (FLORASTOR) 250 MG capsule Take 1 capsule (250 mg total) by mouth 2 (two) times daily. (Patient not taking: No sig reported) 60 capsule 0 Not Taking at Unknown time   Scheduled:  . amLODipine  5 mg Oral Daily  . DULoxetine  30 mg Oral Daily  . folic acid  1 mg Oral Daily  . gabapentin  300 mg Oral BID  . hydroxychloroquine  200 mg Oral Daily  . insulin aspart  0-9 Units Subcutaneous Q4H  . [START ON 02/08/2021] methotrexate  20 mg Oral Q Sun  . predniSONE  40 mg Oral Q breakfast  . rosuvastatin  10  mg Oral Daily    Assessment: Patient is a 34 yof that pharmacy has been asked to dose Heparin for a new onset DVT. Patient does not appear to be on a blood thinner at home. Heparin level < 0.10, no issues with drip & no signs of bleeding.  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

## 2021-02-05 NOTE — Progress Notes (Signed)
IVC/Iliac duplex completed    Please see CV Proc for preliminary results.   Vonzell Schlatter, RVT

## 2021-02-06 ENCOUNTER — Inpatient Hospital Stay (HOSPITAL_COMMUNITY): Payer: Medicare Other

## 2021-02-06 ENCOUNTER — Other Ambulatory Visit (HOSPITAL_COMMUNITY): Payer: Self-pay

## 2021-02-06 ENCOUNTER — Encounter (HOSPITAL_COMMUNITY)
Admission: EM | Disposition: A | Discharge status: Skilled Nursing Facility | Payer: Self-pay | Source: Home / Self Care | Attending: Internal Medicine

## 2021-02-06 DIAGNOSIS — Z7982 Long term (current) use of aspirin: Secondary | ICD-10-CM

## 2021-02-06 DIAGNOSIS — I82412 Acute embolism and thrombosis of left femoral vein: Secondary | ICD-10-CM

## 2021-02-06 DIAGNOSIS — Z7984 Long term (current) use of oral hypoglycemic drugs: Secondary | ICD-10-CM

## 2021-02-06 DIAGNOSIS — I82442 Acute embolism and thrombosis of left tibial vein: Secondary | ICD-10-CM

## 2021-02-06 DIAGNOSIS — I82422 Acute embolism and thrombosis of left iliac vein: Secondary | ICD-10-CM

## 2021-02-06 DIAGNOSIS — Z79899 Other long term (current) drug therapy: Secondary | ICD-10-CM

## 2021-02-06 DIAGNOSIS — I82432 Acute embolism and thrombosis of left popliteal vein: Secondary | ICD-10-CM

## 2021-02-06 HISTORY — PX: PERIPHERAL VASCULAR THROMBECTOMY: CATH118306

## 2021-02-06 LAB — GLUCOSE, CAPILLARY
Glucose-Capillary: 162 mg/dL — ABNORMAL HIGH (ref 70–99)
Glucose-Capillary: 173 mg/dL — ABNORMAL HIGH (ref 70–99)
Glucose-Capillary: 226 mg/dL — ABNORMAL HIGH (ref 70–99)
Glucose-Capillary: 229 mg/dL — ABNORMAL HIGH (ref 70–99)
Glucose-Capillary: 265 mg/dL — ABNORMAL HIGH (ref 70–99)

## 2021-02-06 LAB — CBC
HCT: 36.7 % (ref 36.0–46.0)
HCT: 37.1 % (ref 36.0–46.0)
Hemoglobin: 12 g/dL (ref 12.0–15.0)
Hemoglobin: 12.2 g/dL (ref 12.0–15.0)
MCH: 29.4 pg (ref 26.0–34.0)
MCH: 29.7 pg (ref 26.0–34.0)
MCHC: 32.7 g/dL (ref 30.0–36.0)
MCHC: 32.9 g/dL (ref 30.0–36.0)
MCV: 90 fL (ref 80.0–100.0)
MCV: 90.3 fL (ref 80.0–100.0)
Platelets: 178 10*3/uL (ref 150–400)
Platelets: 169 10*3/uL (ref 150–400)
RBC: 4.08 MIL/uL (ref 3.87–5.11)
RBC: 4.11 MIL/uL (ref 3.87–5.11)
RDW: 15.9 % — ABNORMAL HIGH (ref 11.5–15.5)
RDW: 15.9 % — ABNORMAL HIGH (ref 11.5–15.5)
WBC: 11.9 10*3/uL — ABNORMAL HIGH (ref 4.0–10.5)
WBC: 17.1 10*3/uL — ABNORMAL HIGH (ref 4.0–10.5)
nRBC: 0 % (ref 0.0–0.2)
nRBC: 0 % (ref 0.0–0.2)

## 2021-02-06 LAB — LUPUS ANTICOAGULANT PANEL
DRVVT: 30.6 s (ref 0.0–47.0)
PTT Lupus Anticoagulant: 33.5 s (ref 0.0–51.9)

## 2021-02-06 LAB — PROTIME-INR
INR: 1.3 — ABNORMAL HIGH (ref 0.8–1.2)
Prothrombin Time: 15.7 seconds — ABNORMAL HIGH (ref 11.4–15.2)

## 2021-02-06 LAB — BASIC METABOLIC PANEL
Anion gap: 11 (ref 5–15)
BUN: 8 mg/dL (ref 8–23)
CO2: 20 mmol/L — ABNORMAL LOW (ref 22–32)
Calcium: 8.5 mg/dL — ABNORMAL LOW (ref 8.9–10.3)
Chloride: 107 mmol/L (ref 98–111)
Creatinine, Ser: 0.69 mg/dL (ref 0.44–1.00)
GFR, Estimated: >60 mL/min (ref >60)
Glucose, Bld: 174 mg/dL — ABNORMAL HIGH (ref 70–99)
Potassium: 3.6 mmol/L (ref 3.5–5.1)
Sodium: 138 mmol/L (ref 135–145)

## 2021-02-06 LAB — HOMOCYSTEINE: Homocysteine: 14.8 umol/L (ref 0.0–19.2)

## 2021-02-06 LAB — HEPARIN LEVEL (UNFRACTIONATED)
Heparin Unfractionated: <0.1 IU/mL — ABNORMAL LOW (ref 0.30–0.70)
Heparin Unfractionated: >2.2 IU/mL — ABNORMAL HIGH (ref 0.30–0.70)

## 2021-02-06 IMAGING — DX DG CHEST 1V PORT
1 series · 1 of 1 positions shown · non-contrast
Comparison: 01/23/2021

CLINICAL DATA: Fever

EXAM:
PORTABLE CHEST 1 VIEW

[chest ap]
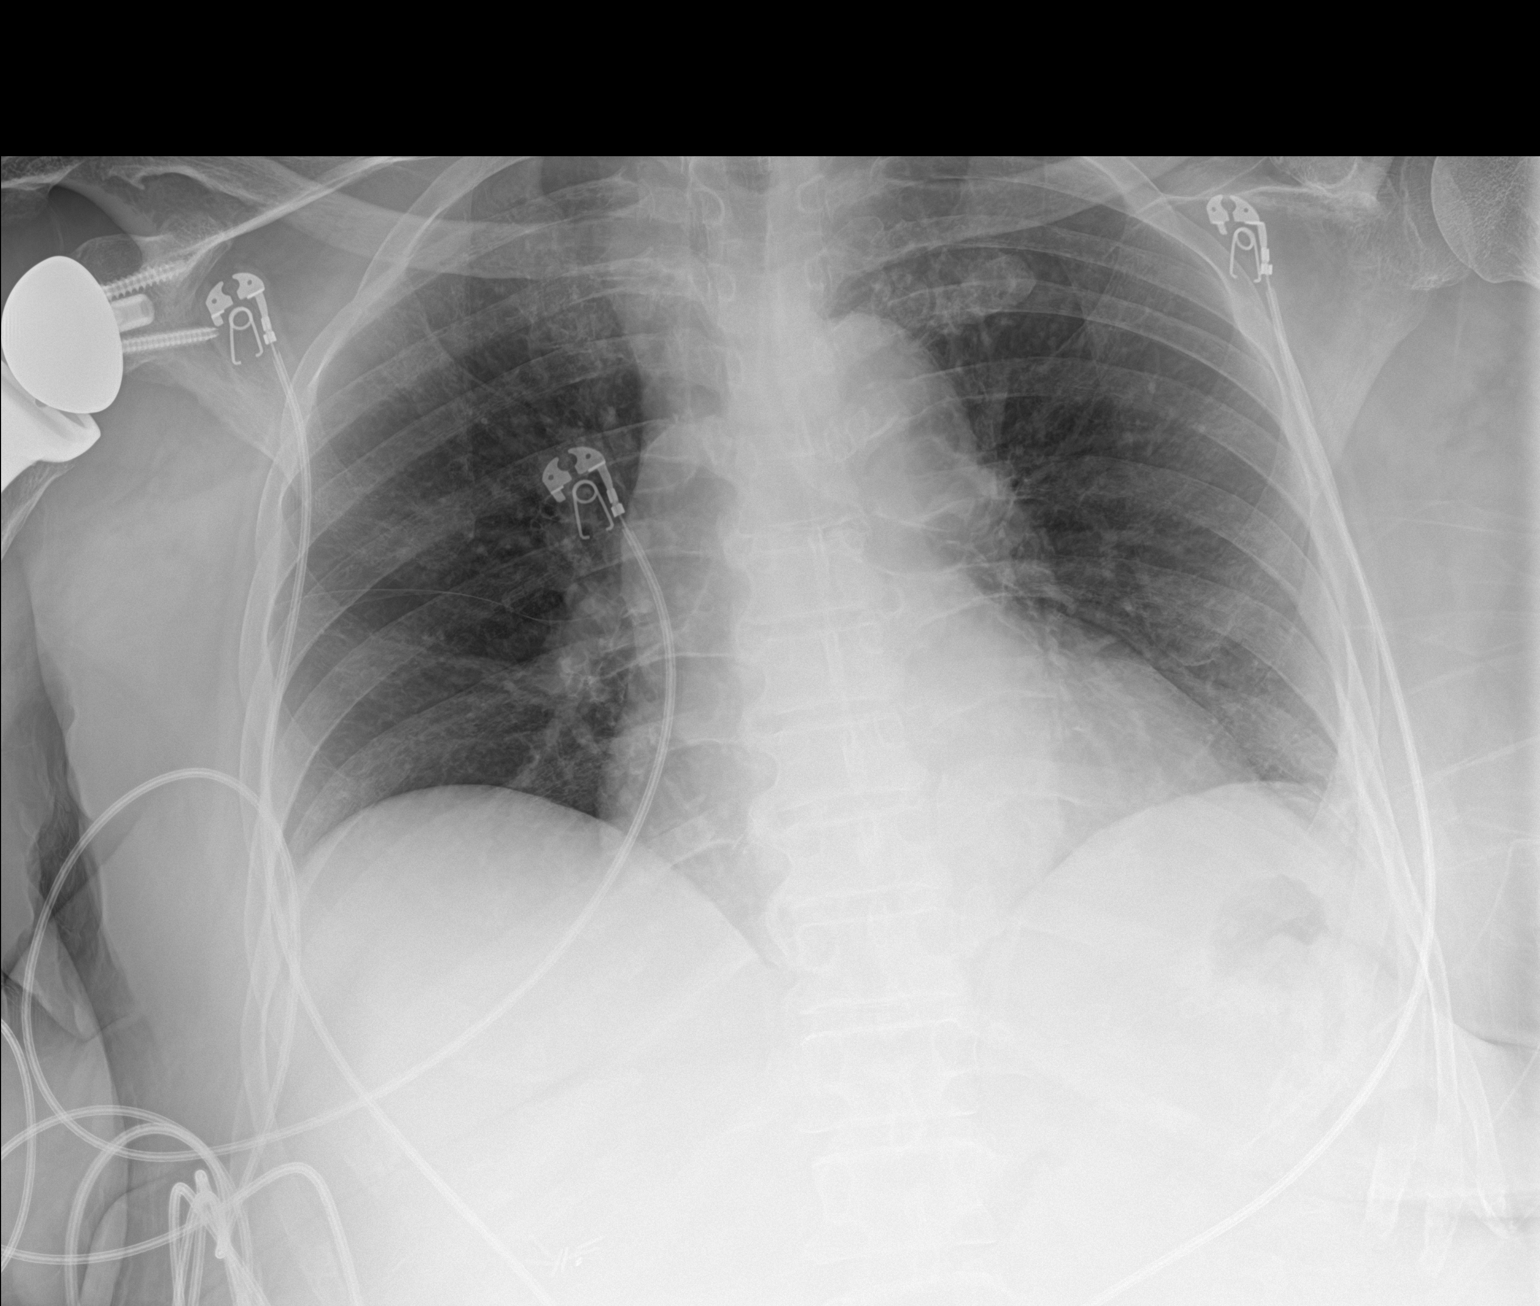

[1 of 1 positions shown; findings below may reference images not displayed]

FINDINGS: No new consolidation or edema. No significant pleural effusion.
Similar cardiomediastinal contours. Partially imaged reverse right
shoulder arthroplasty.
IMPRESSION: No acute process in the chest.

## 2021-02-06 IMAGING — DX DG CHEST 1V PORT
1 series · 1 of 1 positions shown · non-contrast
Comparison: February 06, 2021 chest radiograph obtained earlier in the
day

CLINICAL DATA: Central catheter placement

EXAM:
PORTABLE CHEST 1 VIEW

[chest ap]
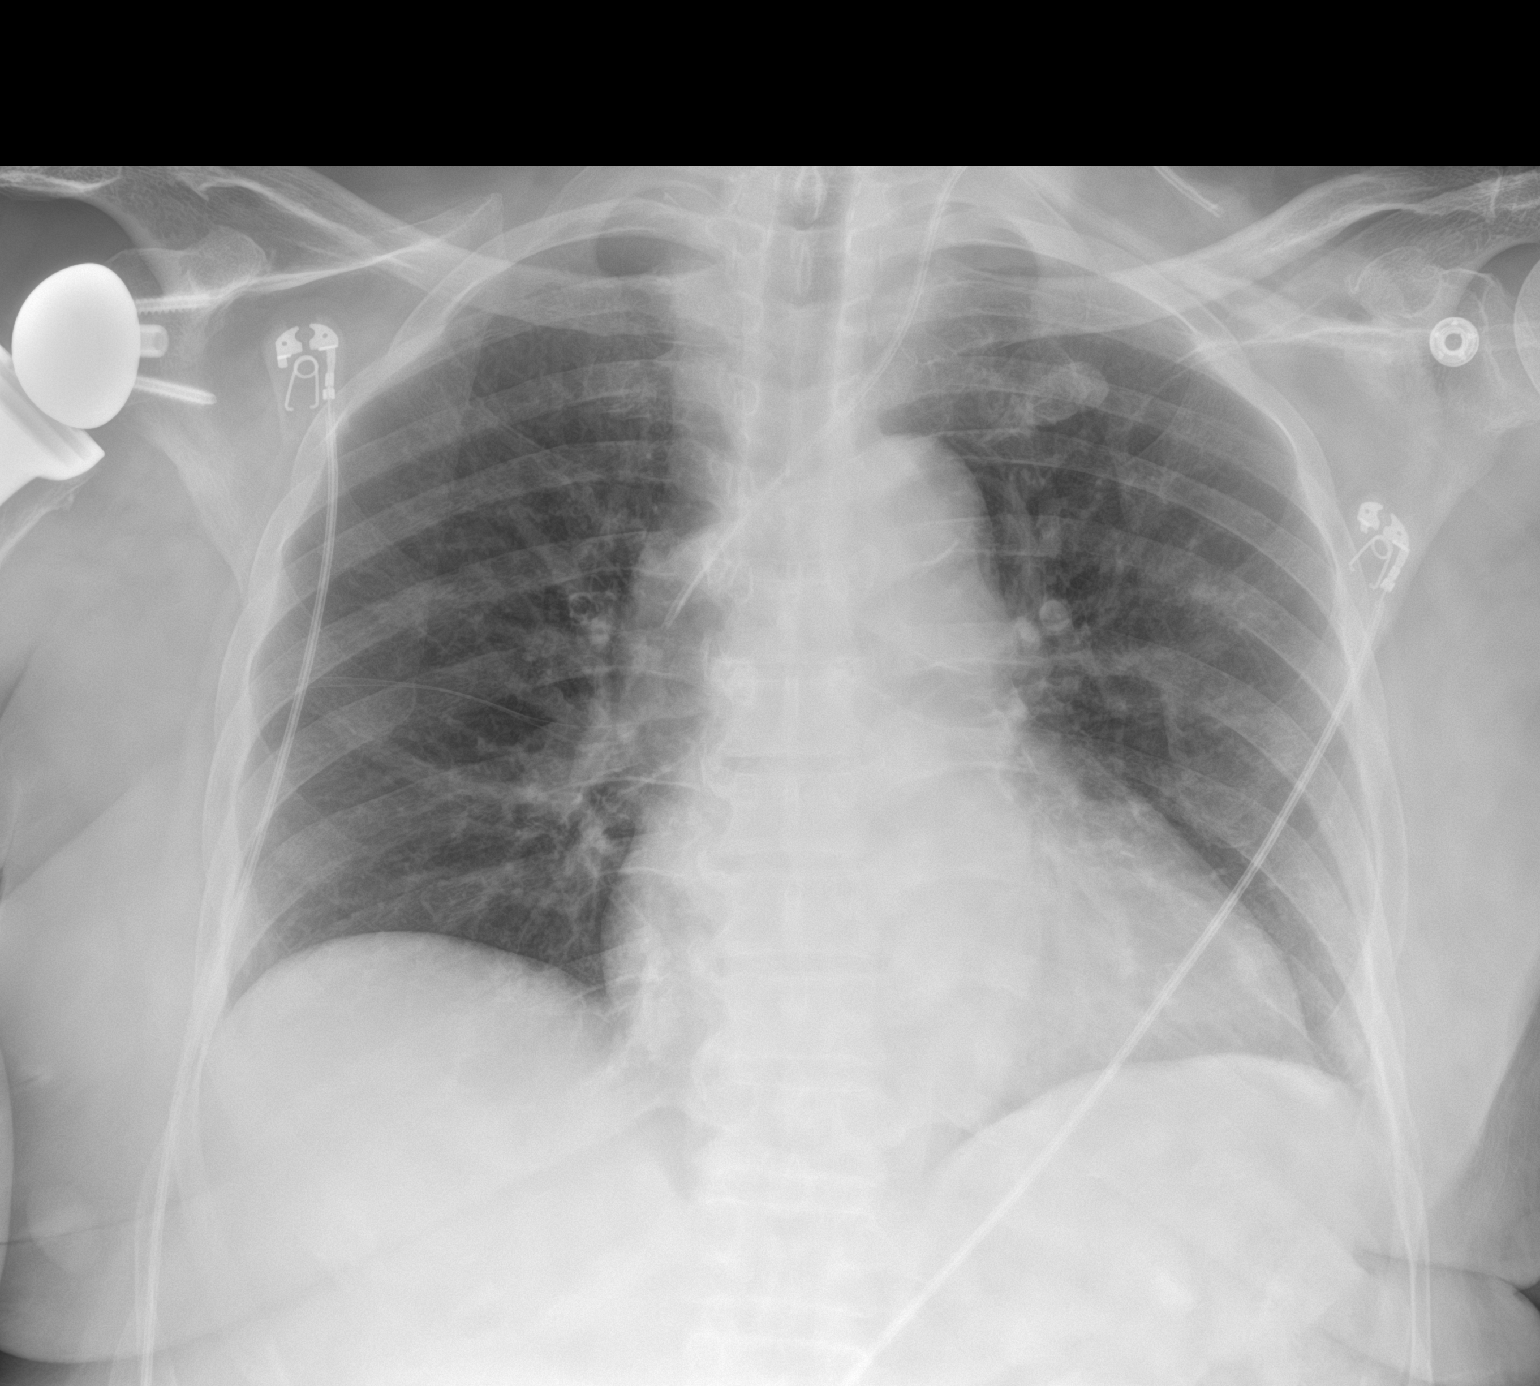

[1 of 1 positions shown; findings below may reference images not displayed]

FINDINGS: Central catheter tip in superior vena cava. No pneumothorax. Subtle
area of opacity noted left upper lobe. Lungs otherwise clear. Heart
mildly enlarged with pulmonary vascularity normal. No adenopathy.
There is aortic atherosclerosis. There is a total shoulder
replacement on the right.
IMPRESSION: Central catheter tip in superior vena cava. No pneumothorax. Suspect
small focus of developing pneumonia left upper lobe. Lungs otherwise
clear. Stable cardiac prominence. Aortic Atherosclerosis
([id number]).

## 2021-02-06 IMAGING — DX DG ABDOMEN 1V
1 series · 1 of 1 positions shown · non-contrast
Comparison: January 24, 2021.

CLINICAL DATA: Constipation.

EXAM:
ABDOMEN - 1 VIEW

[abdomen kub]
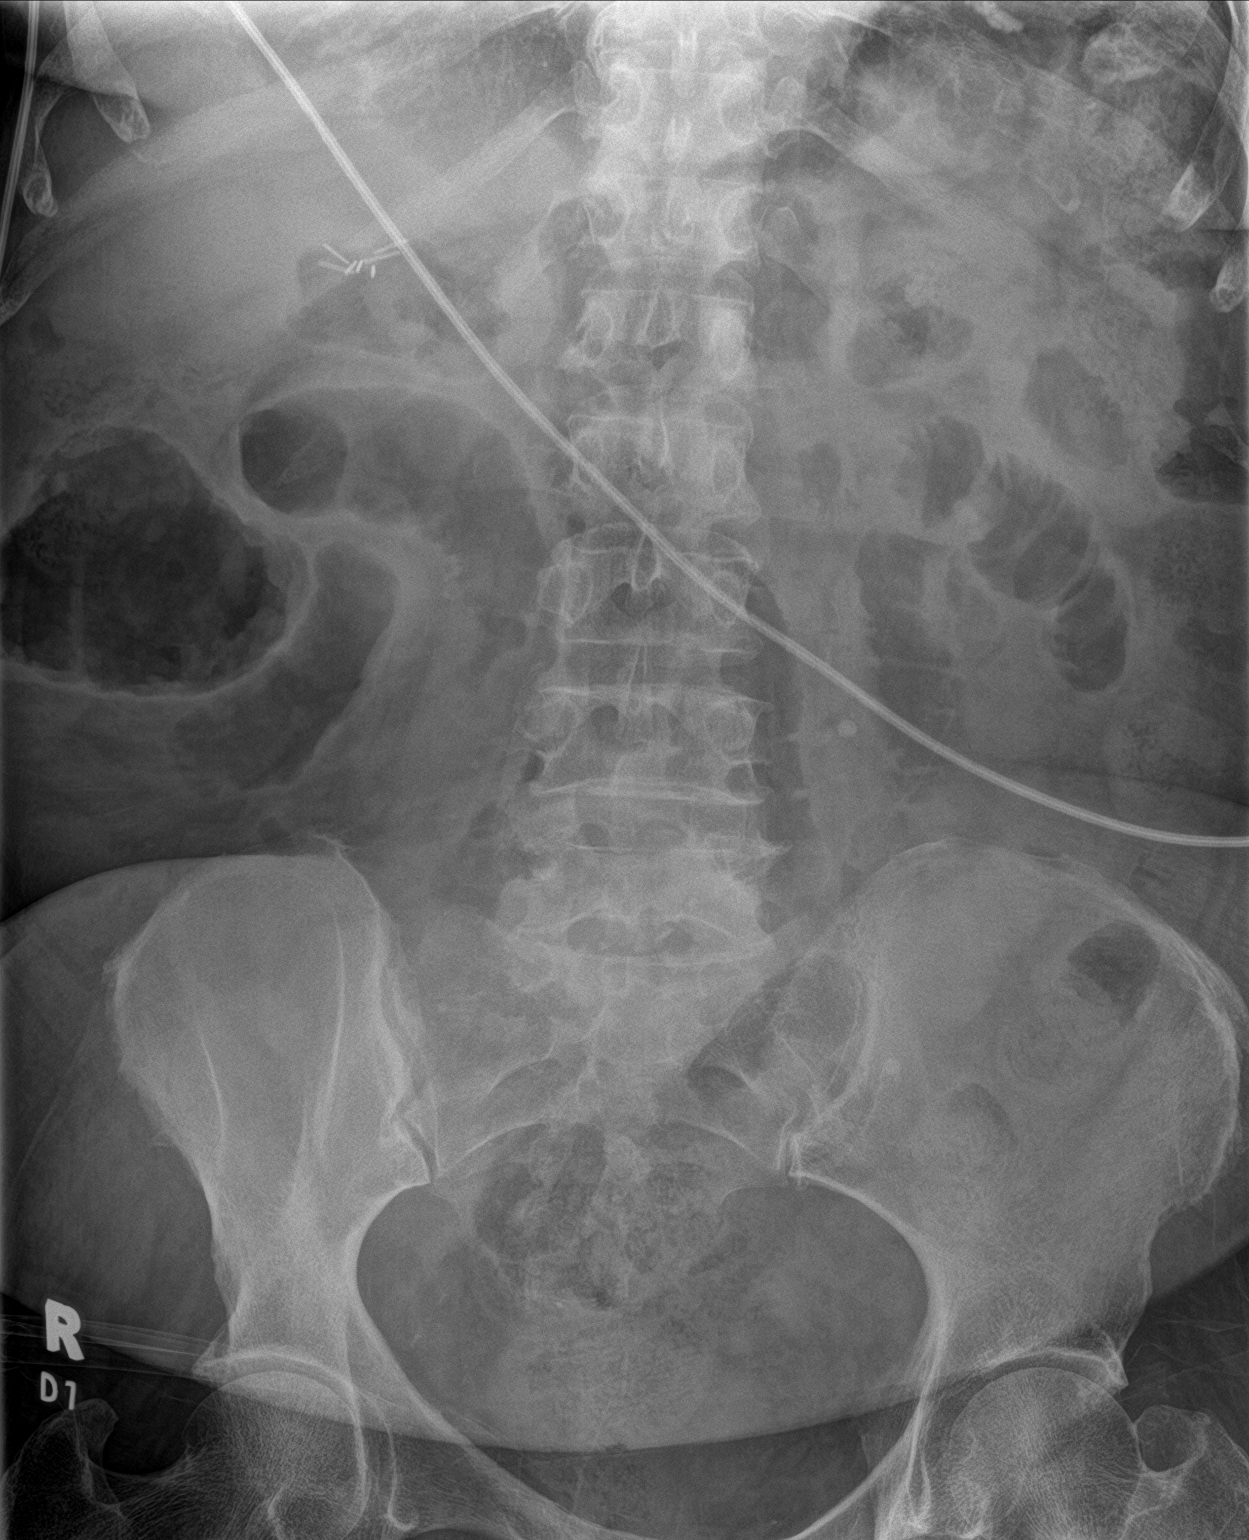

[1 of 1 positions shown; findings below may reference images not displayed]

FINDINGS: No abnormal bowel dilatation is noted. Status post cholecystectomy.
Mild amount of stool seen throughout the colon and rectum. No
radio-opaque calculi or other significant radiographic abnormality
are seen.
IMPRESSION: Mild stool burden.  No abnormal bowel dilatation.

## 2021-02-06 SURGERY — PERIPHERAL VASCULAR THROMBECTOMY
Anesthesia: LOCAL | Laterality: Left

## 2021-02-06 MED ORDER — MIDAZOLAM HCL 2 MG/2ML IJ SOLN
INTRAMUSCULAR | Status: AC
  Filled 2021-02-06: qty 2

## 2021-02-06 MED ORDER — HEPARIN SODIUM (PORCINE) 1000 UNIT/ML IJ SOLN
INTRAMUSCULAR | Status: AC
  Filled 2021-02-06: qty 1

## 2021-02-06 MED ORDER — HYDRALAZINE HCL 20 MG/ML IJ SOLN: 5.0000 mg | INTRAMUSCULAR | Status: DC | PRN

## 2021-02-06 MED ORDER — SODIUM CHLORIDE 0.9% FLUSH: 3.0000 mL | INTRAVENOUS | Status: DC | PRN

## 2021-02-06 MED ORDER — SODIUM CHLORIDE 0.9 % IV SOLN: INTRAVENOUS | Status: AC

## 2021-02-06 MED ORDER — CHLORHEXIDINE GLUCONATE CLOTH 2 % EX PADS
6.0000 | MEDICATED_PAD | Freq: Every day | CUTANEOUS | Status: DC
  Administered 2021-02-07 – 2021-02-09 (×3): 6 via TOPICAL

## 2021-02-06 MED ORDER — SODIUM CHLORIDE 0.9 % IV SOLN: 250.0000 mL | INTRAVENOUS | Status: DC | PRN

## 2021-02-06 MED ORDER — SODIUM CHLORIDE 0.9 % IV SOLN: INTRAVENOUS | Status: DC

## 2021-02-06 MED ORDER — FENTANYL CITRATE (PF) 100 MCG/2ML IJ SOLN
INTRAMUSCULAR | Status: DC | PRN
  Administered 2021-02-06 (×2): 25 ug via INTRAVENOUS

## 2021-02-06 MED ORDER — LIDOCAINE HCL (PF) 1 % IJ SOLN
INTRAMUSCULAR | Status: AC
  Filled 2021-02-06: qty 30

## 2021-02-06 MED ORDER — HEPARIN (PORCINE) IN NACL 1000-0.9 UT/500ML-% IV SOLN
INTRAVENOUS | Status: AC
  Filled 2021-02-06: qty 1000

## 2021-02-06 MED ORDER — OXYCODONE HCL 5 MG PO TABS: 5.0000 mg | ORAL_TABLET | ORAL | Status: DC | PRN

## 2021-02-06 MED ORDER — LABETALOL HCL 5 MG/ML IV SOLN: 10.0000 mg | INTRAVENOUS | Status: DC | PRN

## 2021-02-06 MED ORDER — ACETAMINOPHEN 325 MG PO TABS: 650.0000 mg | ORAL_TABLET | ORAL | Status: DC | PRN

## 2021-02-06 MED ORDER — HEPARIN BOLUS VIA INFUSION
2500.0000 [IU] | Freq: Once | INTRAVENOUS | Status: AC
  Administered 2021-02-06: 2500 [IU] via INTRAVENOUS
  Filled 2021-02-06: qty 2500

## 2021-02-06 MED ORDER — BISACODYL 10 MG RE SUPP
10.0000 mg | Freq: Once | RECTAL | Status: DC
  Filled 2021-02-06: qty 1

## 2021-02-06 MED ORDER — IODIXANOL 320 MG/ML IV SOLN
INTRAVENOUS | Status: DC | PRN
  Administered 2021-02-06: 10 mL via INTRAVENOUS

## 2021-02-06 MED ORDER — MORPHINE SULFATE (PF) 2 MG/ML IV SOLN: 2.0000 mg | INTRAVENOUS | Status: DC | PRN

## 2021-02-06 MED ORDER — SODIUM CHLORIDE 0.9 % IV BOLUS
250.0000 mL | Freq: Once | INTRAVENOUS | Status: AC
  Administered 2021-02-06: 250 mL via INTRAVENOUS

## 2021-02-06 MED ORDER — MIDAZOLAM HCL 2 MG/2ML IJ SOLN
INTRAMUSCULAR | Status: DC | PRN
  Administered 2021-02-06: 1 mg via INTRAVENOUS
  Administered 2021-02-06: 2 mg via INTRAVENOUS

## 2021-02-06 MED ORDER — SODIUM CHLORIDE 0.9% FLUSH
3.0000 mL | Freq: Two times a day (BID) | INTRAVENOUS | Status: DC
  Administered 2021-02-07 – 2021-02-10 (×5): 3 mL via INTRAVENOUS

## 2021-02-06 MED ORDER — LIDOCAINE HCL (PF) 1 % IJ SOLN
INTRAMUSCULAR | Status: DC | PRN
  Administered 2021-02-06: 25 mL via INTRADERMAL

## 2021-02-06 MED ORDER — FENTANYL CITRATE (PF) 100 MCG/2ML IJ SOLN
INTRAMUSCULAR | Status: AC
  Filled 2021-02-06: qty 2

## 2021-02-06 MED ORDER — HEPARIN (PORCINE) IN NACL 1000-0.9 UT/500ML-% IV SOLN
INTRAVENOUS | Status: DC | PRN
  Administered 2021-02-06: 500 mL

## 2021-02-06 MED ORDER — HEPARIN (PORCINE) 25000 UT/250ML-% IV SOLN
2300.0000 [IU]/h | INTRAVENOUS | Status: DC
  Administered 2021-02-06: 2300 [IU]/h via INTRAVENOUS
  Filled 2021-02-06: qty 250

## 2021-02-06 MED ORDER — HEPARIN SODIUM (PORCINE) 1000 UNIT/ML IJ SOLN
INTRAMUSCULAR | Status: DC | PRN
  Administered 2021-02-06: 5000 [IU] via INTRAVENOUS
  Administered 2021-02-06: 12000 [IU] via INTRAVENOUS

## 2021-02-06 SURGICAL SUPPLY — 23 items
BAG SNAP BAND KOVER 36X36 (MISCELLANEOUS) ×1 IMPLANT
BALLN ATLAS 14X40X75 (BALLOONS) ×2
BALLN ATLAS 16X40X75 (BALLOONS) ×2
BALLOON ATLAS 14X40X75 (BALLOONS) IMPLANT
BALLOON ATLAS 16X40X75 (BALLOONS) IMPLANT
CATH ANGIO 5F BER 100CM (CATHETERS) ×1 IMPLANT
CATH HEADHUNTER 5F 125CM (CATHETERS) ×1 IMPLANT
CATH RETRIEVER CLOT 16MMX105CM (CATHETERS) ×1 IMPLANT
CATH VISIONS PV .035 IVUS (CATHETERS) ×1 IMPLANT
COVER DOME SNAP 22 D (MISCELLANEOUS) ×1 IMPLANT
GLIDEWIRE ADV .035X260CM (WIRE) ×1 IMPLANT
KIT CV MULTILUMEN 7FR 20 (SET/KITS/TRAYS/PACK) ×2
KIT CV MULTILUMEN 7FR 20 SUB (SET/KITS/TRAYS/PACK) IMPLANT
KIT ENCORE 26 ADVANTAGE (KITS) ×1 IMPLANT
KIT MICROPUNCTURE NIT STIFF (SHEATH) ×2 IMPLANT
PROTECTION STATION PRESSURIZED (MISCELLANEOUS) ×2
SHEATH CLOT RETRIEVER (SHEATH) ×1 IMPLANT
SHEATH PINNACLE 8F 10CM (SHEATH) ×1 IMPLANT
SHEATH PROBE COVER 6X72 (BAG) ×2 IMPLANT
STATION PROTECTION PRESSURIZED (MISCELLANEOUS) IMPLANT
STENT WALLSTENTÂ 16X90X75 (Permanent Stent) ×2 IMPLANT
TRAY PV CATH (CUSTOM PROCEDURE TRAY) ×1 IMPLANT
WIRE AMPLATZ SS-J .035X260CM (WIRE) ×1 IMPLANT

## 2021-02-06 NOTE — Progress Notes (Signed)
PROGRESS NOTE    Diane [name]  QHU:[id number] DOB: Mar 06, 1946 DOA: 02/04/2021 PCP: [name], MD   Brief Narrative: 75 year old with past medical history significant for hypertension, noninsulin-dependent type 2 diabetes, rheumatoid arthritis, anxiety, depression, mild cognitive deficit, recent hospitalization from 3/24 2022 on 12/02/2020 for partial SBO which was treated nonoperatively presented to the ED complaining of left leg pain and swelling.  Evaluation in the ED Doppler of ER DVT morphine left common femoral vein, saphenous junction, left femoral vein, left proximal profunda vein, left popliteal vein, left posterior tibial veins and left peroneal veins.  Patient was a started on IV heparin and vascular surgery was consulted.  Plan is for thrombectomy today.    Assessment & Plan:   Principal Problem:   Acute DVT (deep venous thrombosis) (HCC) Active Problems:   Hypertension   Type 2 diabetes mellitus with diabetic neuropathy, unspecified (HCC)   SBO (small bowel obstruction) (HCC)   Rheumatoid arthritis (HCC)   1-Acute extensive DVT of left lower extremity: Doppler: DVT involving left common femoral vein, SF junction, left femoral vein, left proximal profunda vein, left popliteal vein, left posterior tibial veins, left peroneal veins. -Risk factors: Obesity, immobilization recent hospitalization.  Plan to check hypercoagulable panel. -Continue with heparin drip. -Vascular consulted, plan for thrombectomy today.   2-Hypertension: Continue with amlodipine and as needed hydralazine  Noninsulin-dependent diabetes type 2, with diabetic neuropathy: Hemoglobin A1c 7.3 on 01/22/2021 Continue with sliding scale insulin  Fever;  Spike fever last night.  Incentive spirometry.  Chest x ray ordered and was negative for infection.  Could be related to DVT>   Abdominal pain, Constipation:  Report pain maybe started 2 days ago. She ignore pain.  No BM since Sunday. She is  passing gas.  Plan for KUB, depending on results might need CT abdomen due to recent history of SBO>  Depending on KUB results, will do suppository.   Rheumatoid arthritis: Continue with methotrexate and Plaquenil  Anxiety, depression: Continue with Cymbalta  Hyperlipidemia: Continue with Crestor  Recent partial SBO: Recently admitted for partial SBO etiology thought to be due to stricture versus inflammation.  She was a started on prednisone 40 mg daily and plan is to follow-up with Dr. Collene Mares who will determine further  taper.     Estimated body mass index is 32.98 kg/m as calculated from the following:   Height as of this encounter: 5\' 8"  (1.727 m).   Weight as of this encounter: 98.4 kg.   DVT prophylaxis: Heparin gtt Code Status: Full code Family Communication: care discussed with patient, daughter updated 4/08 Disposition Plan:  Status is: Inpatient  Remains inpatient appropriate because:IV treatments appropriate due to intensity of illness or inability to take PO   Dispo: The patient is from: Home              Anticipated d/c is to: Home              Patient currently is not medically stable to d/c.   Difficult to place patient No        Consultants:   Vascular  Procedures:  Doppler  Antimicrobials:    Subjective: She report no BM since Sunday.  Report abdominal pain, started 2 days ago, but she always has some pain, she learn to ignore pain.  She denies dysuria, cough.   Objective: Vitals:   02/05/21 2352 02/06/21 0300 02/06/21 0625 02/06/21 0807  BP: (!) 160/75 131/73  137/63  Pulse: 91 92  87  Resp: 19 19 20 20   Temp: 98.5 F (36.9 C) (!) 101.1 F (38.4 C) 98.4 F (36.9 C) 99.5 F (37.5 C)  TempSrc: Oral Oral Oral Oral  SpO2: 99% 99%  94%  Weight:      Height:        Intake/Output Summary (Last 24 hours) at 02/06/2021 0914 Last data filed at 02/06/2021 0600 Gross per 24 hour  Intake 1088.03 ml  Output 400 ml  Net 688.03 ml   Filed  Weights   02/04/21 1800  Weight: 98.4 kg    Examination:  General exam: NAD Respiratory system: CTA Cardiovascular system:  S 1, S 2 RRR Gastrointestinal system: BS present, soft, nt Central nervous system: alert, non focal.  Extremities: Symmetric power    Data Reviewed: I have personally reviewed following labs and imaging studies  CBC: Recent Labs  Lab 02/04/21 2044 02/06/21 0535  WBC 10.4 11.9*  HGB 12.2 12.0  HCT 37.4 36.7  MCV 90.3 90.0  PLT 157 494   Basic Metabolic Panel: Recent Labs  Lab 01/30/21 1407 02/04/21 2044 02/05/21 2032 02/06/21 0535  NA  --  138 138 138  K 3.8 3.7 5.1 3.6  CL  --  108 108 107  CO2  --  21* 21* 20*  GLUCOSE  --  131* 214* 174*  BUN  --  8 10 8   CREATININE  --  0.52 0.72 0.69  CALCIUM  --  8.6* 8.6* 8.5*   GFR: Estimated Creatinine Clearance: 75.7 mL/min (by C-G formula based on SCr of 0.69 mg/dL). Liver Function Tests: No results for input(s): AST, ALT, ALKPHOS, BILITOT, PROT, ALBUMIN in the last 168 hours. No results for input(s): LIPASE, AMYLASE in the last 168 hours. No results for input(s): AMMONIA in the last 168 hours. Coagulation Profile: Recent Labs  Lab 02/05/21 0248 02/06/21 0535  INR 1.2 1.3*   Cardiac Enzymes: No results for input(s): CKTOTAL, CKMB, CKMBINDEX, TROPONINI in the last 168 hours. BNP (last 3 results) No results for input(s): PROBNP in the last 8760 hours. HbA1C: No results for input(s): HGBA1C in the last 72 hours. CBG: Recent Labs  Lab 02/05/21 1651 02/05/21 2108 02/05/21 2354 02/06/21 0404 02/06/21 0804  GLUCAP 148* 189* 162* 173* 162*   Lipid Profile: No results for input(s): CHOL, HDL, LDLCALC, TRIG, CHOLHDL, LDLDIRECT in the last 72 hours. Thyroid Function Tests: No results for input(s): TSH, T4TOTAL, FREET4, T3FREE, THYROIDAB in the last 72 hours. Anemia Panel: No results for input(s): VITAMINB12, FOLATE, FERRITIN, TIBC, IRON, RETICCTPCT in the last 72 hours. Sepsis  Labs: No results for input(s): PROCALCITON, LATICACIDVEN in the last 168 hours.  Recent Results (from the past 240 hour(s))  SARS CORONAVIRUS 2 (TAT 6-24 HRS) Nasopharyngeal Nasopharyngeal Swab     Status: None   Collection Time: 02/04/21  6:03 PM   Specimen: Nasopharyngeal Swab  Result Value Ref Range Status   SARS Coronavirus 2 NEGATIVE NEGATIVE Final    Comment: (NOTE) SARS-CoV-2 target nucleic acids are NOT DETECTED.  The SARS-CoV-2 RNA is generally detectable in upper and lower respiratory specimens during the acute phase of infection. Negative results do not preclude SARS-CoV-2 infection, do not rule out co-infections with other pathogens, and should not be used as the sole basis for treatment or other patient management decisions. Negative results must be combined with clinical observations, patient history, and epidemiological information. The expected result is Negative.  Fact Sheet for Patients: SugarRoll.be  Fact Sheet for Healthcare Providers: https://www.woods-mathews.com/  This test  is not yet approved or cleared by the [place] and  has been authorized for detection and/or diagnosis of SARS-CoV-2 by FDA under an Emergency Use Authorization (EUA). This EUA will remain  in effect (meaning this test can be used) for the duration of the COVID-19 declaration under Se ction 564(b)(1) of the Act, 21 U.S.C. section 360bbb-3(b)(1), unless the authorization is terminated or revoked sooner.  Performed at [facility], [place] [address], [place], [place] [address]          Radiology Studies: VAS [place] IVC/ILIAC (VENOUS ONLY)  Result Date: 02/05/2021 IVC/ILIAC STUDY Other Factors: LLE extensive DVT. Limitations: Obesity and patient discomfort.  Comparison Study: No previous Performing Technologist: Vonzell Schlatter RVT  Examination Guidelines: A complete evaluation includes B-mode imaging, spectral Doppler, color Doppler, and  power Doppler as needed of all accessible portions of each vessel. Bilateral testing is considered an integral part of a complete examination. Limited examinations for reoccurring indications may be performed as noted.  IVC/Iliac Findings: +----------+------+--------+----------------------------------------------+    IVC    PatentThrombus                   Comments                    +----------+------+--------+----------------------------------------------+ IVC Prox  patent                                                       +----------+------+--------+----------------------------------------------+ IVC Mid   patent                                                       +----------+------+--------+----------------------------------------------+ IVC Distal              Possible DVT however not adequately visualized +----------+------+--------+----------------------------------------------+  +-------------------+---------+-----------+---------+-----------+--------+         CIV        RT-PatentRT-ThrombusLT-PatentLT-ThrombusComments +-------------------+---------+-----------+---------+-----------+--------+ Common Iliac Prox   patent                                          +-------------------+---------+-----------+---------+-----------+--------+ Common Iliac Mid    patent                                          +-------------------+---------+-----------+---------+-----------+--------+ Common Iliac Distal patent                                          +-------------------+---------+-----------+---------+-----------+--------+ Possible DVT in the CIV however vessel was not adequately visualized. +-------------------------+---------+-----------+---------+-----------+--------+            EIV           RT-PatentRT-ThrombusLT-PatentLT-ThrombusComments +-------------------------+---------+-----------+---------+-----------+--------+ External Iliac Vein Prox  patent                          acute            +-------------------------+---------+-----------+---------+-----------+--------+  External Iliac Vein Mid   patent                         acute            +-------------------------+---------+-----------+---------+-----------+--------+ External Iliac Vein       patent                         acute            Distal                                                                    +-------------------------+---------+-----------+---------+-----------+--------+  Summary: IVC/Iliac: There is no evidence of thrombus involving the right common iliac vein. There is no evidence of thrombus involving the right external iliac vein. There is evidence of acute thrombus involving the left external iliac vein. Visualization of distal Inferior Vena Cava and left proximal to distal common Iliac vein was were not well visualized.  *See table(s) above for measurements and observations.  Electronically signed by Servando Snare MD on 02/05/2021 at 2:58:01 PM.    Final    DG CHEST PORT 1 VIEW  Result Date: 02/06/2021 CLINICAL DATA:  Fever EXAM: PORTABLE CHEST 1 VIEW COMPARISON:  01/23/2021 FINDINGS: No new consolidation or edema. No significant pleural effusion. Similar cardiomediastinal contours. Partially imaged reverse right shoulder arthroplasty. IMPRESSION: No acute process in the chest. Electronically Signed   By: Macy Mis M.D.   On: 02/06/2021 08:57   VAS [place] LOWER EXTREMITY VENOUS (DVT) (MC and WL 7a-7p)  Result Date: 02/04/2021  Lower Venous DVT Study Indications: Pain, and Swelling.  Risk Factors: None identified. Comparison Study: Previous 8/21 Neg Performing Technologist: Vonzell Schlatter RVT  Examination Guidelines: A complete evaluation includes B-mode imaging, spectral Doppler, color Doppler, and power Doppler as needed of all accessible portions of each vessel. Bilateral testing is considered an integral part of a complete examination. Limited  examinations for reoccurring indications may be performed as noted. The reflux portion of the exam is performed with the patient in reverse Trendelenburg.  +---------+---------------+---------+-----------+----------+--------------+ LEFT     CompressibilityPhasicitySpontaneityPropertiesThrombus Aging +---------+---------------+---------+-----------+----------+--------------+ CFV      None                                         Acute          +---------+---------------+---------+-----------+----------+--------------+ SFJ      None                                         Acute          +---------+---------------+---------+-----------+----------+--------------+ FV Prox  None                                         Acute          +---------+---------------+---------+-----------+----------+--------------+ FV Mid   None  Acute          +---------+---------------+---------+-----------+----------+--------------+ FV DistalNone                                         Acute          +---------+---------------+---------+-----------+----------+--------------+ PFV      None                                         Acute          +---------+---------------+---------+-----------+----------+--------------+ POP      None                                         Acute          +---------+---------------+---------+-----------+----------+--------------+ PTV      None                                         Acute          +---------+---------------+---------+-----------+----------+--------------+ PERO     None                                         Acute          +---------+---------------+---------+-----------+----------+--------------+  Summary: LEFT: - Findings consistent with acute deep vein thrombosis involving the left common femoral vein, SF junction, left femoral vein, left proximal profunda vein, left popliteal vein, left  posterior tibial veins, and left peroneal veins.  *See table(s) above for measurements and observations. Electronically signed by Harold Barban MD on 02/04/2021 at 9:58:10 PM.    Final         Scheduled Meds: . amLODipine  5 mg Oral Daily  . DULoxetine  30 mg Oral Daily  . folic acid  1 mg Oral Daily  . gabapentin  300 mg Oral BID  . hydroxychloroquine  200 mg Oral Daily  . insulin aspart  0-9 Units Subcutaneous Q4H  . [START ON 02/08/2021] methotrexate  20 mg Oral Q Sun  . predniSONE  40 mg Oral Q breakfast  . rosuvastatin  10 mg Oral Daily   Continuous Infusions: . sodium chloride 100 mL/hr at 02/06/21 0600  . heparin Stopped (02/06/21 0909)     LOS: 2 days    Time spent: 35 minutes.     Elmarie Shiley, MD [facility]   If 7PM-7AM, please contact night-coverage [email or web address]  02/06/2021, 9:14 AM

## 2021-02-06 NOTE — Progress Notes (Addendum)
Found pt had swollen around IV site. Stopped heparin and put IV team consult. Pt need central line access due to her small vein. MD aware. Planned to get CV and thrombectomy  At the same time per MD.   Lavenia Atlas, RN

## 2021-02-06 NOTE — Progress Notes (Signed)
Obtained consent form and placed in pt's chart.   Naveen Clardy S Hollan Philipp, RN  

## 2021-02-06 NOTE — Progress Notes (Addendum)
Pt came back to rm 13 from IR on heparin drip and NS at 75 ml/hr. Reinitiated tele. VSS. Call bell within reach.  Lavenia Atlas, RN

## 2021-02-06 NOTE — TOC Benefit Eligibility Note (Signed)
Patient Teacher, English as a foreign language completed.    The patient is currently admitted and upon discharge could be taking Eliquis 5 mg.  The current 30 day co-pay is, $47.00.   The patient is currently admitted and upon discharge could be taking Xarelto 15 mg.  The current 30 day co-pay is, $47.00.   The patient is insured through [place] [place] Patient Advocate Specialist [place] Team Direct Number: [phone]  Fax: [phone]

## 2021-02-06 NOTE — Interval H&P Note (Signed)
History and Physical Interval Note:  02/06/2021 11:56 AM  Diane [name]  has presented today for surgery, with the diagnosis of femoral DVT.  The various methods of treatment have been discussed with the patient and family. After consideration of risks, benefits and other options for treatment, the patient has consented to  Procedure(s): PERIPHERAL VASCULAR THROMBECTOMY (Left) as a surgical intervention.  The patient's history has been reviewed, patient examined, no change in status, stable for surgery.  I have reviewed the patient's chart and labs.  Questions were answered to the patient's satisfaction.    Patient's primary team also called [place] and requested [place] to place central access as the patient has had poor problems with IV access.  We will place that today.  I discussed this with her daughter and the patient as well.  Risks including bleeding pneumothorax both fairly low risk.   Ruta Hinds

## 2021-02-06 NOTE — Progress Notes (Signed)
  [place] for Heparin Indication: DVT  Allergies  Allergen Reactions  . Cephalexin Other (See Comments)    URINARY RETENTION = "blocked my urine"  . Codeine Rash    Other reaction(s): hives  . Nucynta [Tapentadol Hcl] Other (See Comments)    Altered mental status    Patient Measurements: Height: 5\' 8"  (172.7 cm) Weight: 98.4 kg (216 lb 14.9 oz) IBW/kg (Calculated) : 63.9 Heparin Dosing Weight: 85.4  Vital Signs: Temp: 98.4 F (36.9 C) (04/08 0625) Temp Source: Oral (04/08 0625) BP: 131/73 (04/08 0300) Pulse Rate: 92 (04/08 0300)  Labs: Recent Labs    02/04/21 2044 02/05/21 0248 02/05/21 1527 02/05/21 2032 02/06/21 0535  HGB 12.2  --   --   --  12.0  HCT 37.4  --   --   --  36.7  PLT 157  --   --   --  178  LABPROT  --  14.6  --   --  15.7*  INR  --  1.2  --   --  1.3*  HEPARINUNFRC  --  <0.10* <0.10*  --  <0.10*  CREATININE 0.52  --   --  0.72 0.69    Estimated Creatinine Clearance: 75.7 mL/min (by C-G formula based on SCr of 0.69 mg/dL).  Assessment: 75 y.o. female with DVT for heparin Goal of Therapy:  Heparin level 0.3-0.7 units/mL Monitor platelets by anticoagulation protocol: Yes   Plan:  Heparin 2500 units IV bolus, then increase heparin 2300 units/hr F/U after OR  Phillis Knack, PharmD, BCPS

## 2021-02-06 NOTE — Interval H&P Note (Signed)
History and Physical Interval Note:  02/06/2021 11:30 AM  Diane [name]  has presented today for surgery, with the diagnosis of femoral DVT.  The various methods of treatment have been discussed with the patient and family. After consideration of risks, benefits and other options for treatment, the patient has consented to  Procedure(s): PERIPHERAL VASCULAR THROMBECTOMY (Left) as a surgical intervention.  The patient's history has been reviewed, patient examined, no change in status, stable for surgery.  I have reviewed the patient's chart and labs.  Questions were answered to the patient's satisfaction.     Ruta Hinds

## 2021-02-06 NOTE — Op Note (Signed)
Procedure:  1.  Attempted right internal jugular vein central line, placement of left internal jugular vein triple-lumen catheter using ultrasound guidance  2.  Left leg venous thrombectomy inferior vena cava left common iliac external iliac common femoral popliteal vein  3.  Intravascular ultrasound of inferior vena cava left common iliac external iliac common femoral superficial femoral popliteal vein  4. Left common and external iliac artery venous stent (16 x 90 x 2)  Operative details: After obtaining informed consent, the patient was brought to the [facility] lab.  The patient was placed in supine position on the angio table.  Both sides of her neck and chest were prepped and draped in usual sterile fashion.  Local anesthesia was infiltrated over the right internal jugular vein.  Ultrasound was used to identify the right internal jugular vein which had normal respiratory variation compressibility.  Several attempts were made to cannulate the right internal jugular vein under ultrasound guidance using a micropuncture and a standard introducer needle.  These were all unsuccessful.  At this point attempts were aborted on the right side.  Attention was turned to the left neck.  Left internal jugular vein also had normal compressibility and respiratory variation.  Using ultrasound guidance after infiltration of local anesthesia the left internal jugular vein was successfully cannulated and an 035 J-tip guidewire threaded in the central circulation.  The tract was dilated and a triple-lumen catheter placed over the guidewire into the central circulation.  This was noted to flush and draw easily.  All 3 ports flushed and drew easily.  The catheter was sutured the skin with silk sutures.  At this point the patient was taken off the table and flipped into a prone position for accessing the left popliteal vein.  Sterile prep and drape was performed of the left popliteal fossa.  Ultrasound was used to identify the  popliteal vein and popliteal artery.  This was fairly difficult secondary to a large amount of edema in the leg.  Micropuncture needle was used to cannulate the vein.  Micropuncture wire was advanced into the venous system.  Micropuncture sheath was then placed over this.  On removing the dilator and guidewire were I found that we were in the popliteal artery.  Therefore the entire apparatus was removed and hemostasis obtained with direct pressure for 5 minutes.  At this point ultrasound was again used to identify the popliteal vein.  Micropuncture needle was used to cannulate the popliteal vein and a guidewire advanced.  On this occasion the blood was much darker in character and the appearance of venous blood.  A micro puncture sheath was placed over this.  Dilator and guidewire was removed and there was good venous return.  The patient was given 12,000 units of intravenous heparin.  She was given an additional 5000 units of heparin during the course of the case.  She was started on a heparin drip at 2000 units/h at the conclusion of the case.  035 Amplatz wire was then threaded up to the level of the left subclavian vein.  An 8 French sheath was placed over this.  Intravascular ultrasound was then performed of the inferior vena cava common iliac external iliac common femoral superficial femoral and popliteal veins.  There was diffuse thrombus starting from the distal inferior vena cava and extending all the way down the left leg.  The 8 French sheath was then swapped out over the Amplatz wire for the NRE sheath.  The NRA device was deployed at approximately  the L to the L3 vertebral body to capture all of the thrombus.  We measured the IVC with IVUS prior to this to make sure the NRA device would fit.  4 passes were made with retrieval of a large amount of thrombus.  At this point IVUS was placed back over the guidewire and all the thrombus had essentially been cleared.  There was a narrowing of the left common  iliac vein right at its origin between 50 and 70%.  A 16 x 90 Wallstent was then brought up in the operative field and deployed high above the iliac bifurcation.  As we fully deployed the stent unfortunately this failed down below the area of narrowing.  This required a second 16 x 90 stent.  We were able to deploy this and have it fall right at the origin of the left common iliac about 2 mm below its origin.  This was postdilated with a 16 balloon.  To 6 atm.  I then ballooned all of this all the way down to the edge of the previous stent.  Repeat IVUS was performed which showed a widely patent lumen of the left common iliac vein extending all the way to the origin and fully deployed stent.  I then placed an 035 Berenstein catheter over this and placed it at the iliac confluence.  Contrast angiogram was performed which showed again the stent was about 2 mm below the origin of the left common iliac vein and the right iliac vein was not compromised or the IVC.  At this point guidewires and sheaths were removed a Monocryl stitch was used to close the area of the popliteal fossa.  Hemostasis was obtained.  The patient was flipped back to a supine position and taken to the holding area.  The patient was in stable condition.  There were no complications.  Operative management: Patient will be maintained on an heparin drip and needs full oral anticoagulation with overlap as she would be high risk of rethrombosis without complete overlap of her oral anticoagulation.  Once she is fully anticoagulated with oral anticoagulation she can discharge home from our standpoint.  Dr. Scot Dock is on call this weekend if there are any questions.  Chest x-ray will be obtained in the holding area to confirm the central line is in good position and no pneumothorax.  Ruta Hinds, MD [facility] [place] Office: [phone]

## 2021-02-06 NOTE — Progress Notes (Signed)
  [place] for Heparin Indication: DVT  Allergies  Allergen Reactions  . Cephalexin Other (See Comments)    URINARY RETENTION = "blocked my urine"  . Codeine Rash    Other reaction(s): hives  . Nucynta [Tapentadol Hcl] Other (See Comments)    Altered mental status    Patient Measurements: Height: 5\' 8"  (172.7 cm) Weight: 98.4 kg (216 lb 14.9 oz) IBW/kg (Calculated) : 63.9 Heparin Dosing Weight: 85.4  Vital Signs: Temp: 98 F (36.7 C) (04/08 1124) Temp Source: Oral (04/08 1124) BP: 132/60 (04/08 1124) Pulse Rate: 85 (04/08 1124)  Labs: Recent Labs    02/04/21 2044 02/05/21 0248 02/05/21 1527 02/05/21 2032 02/06/21 0535  HGB 12.2  --   --   --  12.0  HCT 37.4  --   --   --  36.7  PLT 157  --   --   --  178  LABPROT  --  14.6  --   --  15.7*  INR  --  1.2  --   --  1.3*  HEPARINUNFRC  --  <0.10* <0.10*  --  <0.10*  CREATININE 0.52  --   --  0.72 0.69    Estimated Creatinine Clearance: 75.7 mL/min (by C-G formula based on SCr of 0.69 mg/dL).  Assessment: 75 y.o. female with DVT for heparin, turned off this morning with loss of IV access. Currently receiving peripheral vascular thrombectomy and received multiple boluses of heparin during procedure.  Pharmacy asked to restart IV heparin at prior infusion rate of 2300 units/hr per Dr. Oneida Alar.   Goal of Therapy:  Heparin level 0.3-0.7 units/mL Monitor platelets by anticoagulation protocol: Yes   Plan:  Restart heparin 2300 units/hr now Check 6hr HL Monitor daily HL, CBC, sxs bleeding  Richardine Service, PharmD, BCPS PGY2 Cardiology Pharmacy Resident Phone: [phone] 02/06/2021  2:59 PM  Please check [email or web address] for unit-specific pharmacy phone numbers.

## 2021-02-07 DIAGNOSIS — I82412 Acute embolism and thrombosis of left femoral vein: Principal | ICD-10-CM

## 2021-02-07 DIAGNOSIS — R5081 Fever presenting with conditions classified elsewhere: Secondary | ICD-10-CM

## 2021-02-07 DIAGNOSIS — R509 Fever, unspecified: Secondary | ICD-10-CM

## 2021-02-07 LAB — GLUCOSE, CAPILLARY
Glucose-Capillary: 150 mg/dL — ABNORMAL HIGH (ref 70–99)
Glucose-Capillary: 134 mg/dL — ABNORMAL HIGH (ref 70–99)
Glucose-Capillary: 306 mg/dL — ABNORMAL HIGH (ref 70–99)
Glucose-Capillary: 224 mg/dL — ABNORMAL HIGH (ref 70–99)
Glucose-Capillary: 274 mg/dL — ABNORMAL HIGH (ref 70–99)
Glucose-Capillary: 182 mg/dL — ABNORMAL HIGH (ref 70–99)

## 2021-02-07 LAB — BASIC METABOLIC PANEL
Anion gap: 4 — ABNORMAL LOW (ref 5–15)
BUN: 7 mg/dL — ABNORMAL LOW (ref 8–23)
CO2: 25 mmol/L (ref 22–32)
Calcium: 7.8 mg/dL — ABNORMAL LOW (ref 8.9–10.3)
Chloride: 110 mmol/L (ref 98–111)
Creatinine, Ser: 0.59 mg/dL (ref 0.44–1.00)
GFR, Estimated: >60 mL/min (ref >60)
Glucose, Bld: 138 mg/dL — ABNORMAL HIGH (ref 70–99)
Potassium: 3.5 mmol/L (ref 3.5–5.1)
Sodium: 139 mmol/L (ref 135–145)

## 2021-02-07 LAB — PROTIME-INR
INR: 1.6 — ABNORMAL HIGH (ref 0.8–1.2)
Prothrombin Time: 18 seconds — ABNORMAL HIGH (ref 11.4–15.2)

## 2021-02-07 LAB — CBC
HCT: 30.8 % — ABNORMAL LOW (ref 36.0–46.0)
Hemoglobin: 9.9 g/dL — ABNORMAL LOW (ref 12.0–15.0)
MCH: 29 pg (ref 26.0–34.0)
MCHC: 32.1 g/dL (ref 30.0–36.0)
MCV: 90.3 fL (ref 80.0–100.0)
Platelets: 141 10*3/uL — ABNORMAL LOW (ref 150–400)
RBC: 3.41 MIL/uL — ABNORMAL LOW (ref 3.87–5.11)
RDW: 15.8 % — ABNORMAL HIGH (ref 11.5–15.5)
WBC: 11.4 10*3/uL — ABNORMAL HIGH (ref 4.0–10.5)
nRBC: 0 % (ref 0.0–0.2)

## 2021-02-07 LAB — HEPARIN LEVEL (UNFRACTIONATED): Heparin Unfractionated: 1.46 IU/mL — ABNORMAL HIGH (ref 0.30–0.70)

## 2021-02-07 MED ORDER — APIXABAN 5 MG PO TABS
10.0000 mg | ORAL_TABLET | Freq: Two times a day (BID) | ORAL | Status: DC
  Administered 2021-02-07 – 2021-02-10 (×7): 10 mg via ORAL
  Filled 2021-02-07 (×7): qty 2

## 2021-02-07 MED ORDER — HEPARIN (PORCINE) 25000 UT/250ML-% IV SOLN
1900.0000 [IU]/h | INTRAVENOUS | Status: DC
  Administered 2021-02-07: 1900 [IU]/h via INTRAVENOUS
  Filled 2021-02-07: qty 250

## 2021-02-07 MED ORDER — APIXABAN 5 MG PO TABS: 5.0000 mg | ORAL_TABLET | Freq: Two times a day (BID) | ORAL | Status: DC

## 2021-02-07 NOTE — Progress Notes (Signed)
  [place] for IV heparin to apixaban  Indication: DVT  Allergies  Allergen Reactions  . Cephalexin Other (See Comments)    URINARY RETENTION = "blocked my urine"  . Codeine Rash    Other reaction(s): hives  . Nucynta [Tapentadol Hcl] Other (See Comments)    Altered mental status    Patient Measurements: Height: 5\' 8"  (172.7 cm) Weight: 98.4 kg (216 lb 14.9 oz) IBW/kg (Calculated) : 63.9 Heparin Dosing Weight: 85.4  Vital Signs: Temp: 98 F (36.7 C) (04/09 0413) Temp Source: Oral (04/09 0413) BP: 123/73 (04/09 0413) Pulse Rate: 105 (04/09 0413)  Labs: Recent Labs    02/04/21 2044 02/05/21 0248 02/05/21 0248 02/05/21 1527 02/05/21 2032 02/06/21 0535 02/06/21 1917 02/06/21 2231  HGB 12.2  --   --   --   --  12.0 12.2  --   HCT 37.4  --   --   --   --  36.7 37.1  --   PLT 157  --   --   --   --  178 169  --   LABPROT  --  14.6  --   --   --  15.7*  --   --   INR  --  1.2  --   --   --  1.3*  --   --   HEPARINUNFRC  --  <0.10*   < > <0.10*  --  <0.10*  --  >2.20*  CREATININE 0.52  --   --   --  0.72 0.69  --   --    < > = values in this interval not displayed.    Estimated Creatinine Clearance: 75.7 mL/min (by C-G formula based on SCr of 0.69 mg/dL).  Assessment: 75 y.o. female with DVT for heparin, turned off this morning with loss of IV access. Currently receiving peripheral vascular thrombectomy and received multiple boluses of heparin during procedure.  Pharmacy consulted to restart IV heparin, now changing to apixaban. HL this AM supratherapeutic but no s/sx bleeding noted. CBC down slightly, Hgb 9.9, PLT 141. Will monitor CBC carefully. Patient's copay for apixaban is $47.   Goal of Therapy:  Monitor platelets by anticoagulation protocol: Yes   Plan:  Stop IV heparin Initiate apixaban 10mg  twice daily x7 days, then continue 5mg  twice daily Follow CBC, s/sx bleeding Counsel pt on apixaban  Mercy Riding,  PharmD PGY1 Acute Care Pharmacy Resident Please refer to [facility] for unit-specific pharmacist

## 2021-02-07 NOTE — Progress Notes (Signed)
  [place] for Heparin Indication: DVT  Allergies  Allergen Reactions  . Cephalexin Other (See Comments)    URINARY RETENTION = "blocked my urine"  . Codeine Rash    Other reaction(s): hives  . Nucynta [Tapentadol Hcl] Other (See Comments)    Altered mental status    Patient Measurements: Height: 5\' 8"  (172.7 cm) Weight: 98.4 kg (216 lb 14.9 oz) IBW/kg (Calculated) : 63.9 Heparin Dosing Weight: 85.4  Vital Signs: Temp: 97.8 F (36.6 C) (04/08 2359) Temp Source: Oral (04/08 2359) BP: 105/64 (04/08 2359) Pulse Rate: 117 (04/08 2359)  Labs: Recent Labs    02/04/21 2044 02/05/21 0248 02/05/21 0248 02/05/21 1527 02/05/21 2032 02/06/21 0535 02/06/21 1917 02/06/21 2231  HGB 12.2  --   --   --   --  12.0 12.2  --   HCT 37.4  --   --   --   --  36.7 37.1  --   PLT 157  --   --   --   --  178 169  --   LABPROT  --  14.6  --   --   --  15.7*  --   --   INR  --  1.2  --   --   --  1.3*  --   --   HEPARINUNFRC  --  <0.10*   < > <0.10*  --  <0.10*  --  >2.20*  CREATININE 0.52  --   --   --  0.72 0.69  --   --    < > = values in this interval not displayed.    Estimated Creatinine Clearance: 75.7 mL/min (by C-G formula based on SCr of 0.69 mg/dL).  Assessment: 75 y.o. female with DVT for heparin, turned off this morning with loss of IV access. Currently receiving peripheral vascular thrombectomy and received multiple boluses of heparin during procedure.  Pharmacy asked to restart IV heparin at prior infusion rate of 2300 units/hr per Dr. Oneida Alar.   4/9 AM update:  Heparin level elevated  Drawn correctly-Heparin running through central line and lab drawn peripherally  Pt received 17,000 units of heparin in OR earlier  Goal of Therapy:  Heparin level 0.3-0.7 units/mL Monitor platelets by anticoagulation protocol: Yes   Plan:  Hold heparin x 1 hr Re-start heparin at 1900 units/hr at 0130 0900 heparin level  Narda Bonds, PharmD,  [place] Pharmacist Phone: [phone]

## 2021-02-07 NOTE — Progress Notes (Signed)
Left thigh measurement done, thigh high compression ordered and applied to BLE. Pt tolerated well. Left Lower leg elevated per order with head slightly elevated. Will continue to monitor.

## 2021-02-07 NOTE — Progress Notes (Addendum)
[facility]  VASCULAR SURGERY ASSESSMENT & PLAN:   S/P MECHANICAL THROMBECTOMY IVC LEFT ILIAC VEIN/LEFT COMMON ILIAC VEIN STENT: The patient is doing well status post mechanical thrombectomy yesterday.  There is no significant hematoma in the left popliteal space.  We will have her fitted with thigh-high compression stockings with a gradient of 20 to 30 mmHg.  I have ordered leg elevation.  She is on intravenous heparin and will need to be converted to a DOAC prior to discharge.  Deitra Mayo, MD Office: [phone]   Subjective  - no new complaints   Objective 123/73 (!) 105 98 F (36.7 C) (Oral) 16 100%  Intake/Output Summary (Last 24 hours) at 02/07/2021 0744 Last data filed at 02/07/2021 0636 Gross per 24 hour  Intake 1352.87 ml  Output 350 ml  Net 1002.87 ml    Palpable DP Cont. Edema in the left LE Popliteal dressing clean and dry Motor and sensation intact in left LE  Lungs non labored breathing Heart Tachy 105 bpm  Assessment/Planning: POD # 1 1.  Attempted right internal jugular vein central line, placement of left internal jugular vein triple-lumen catheter using ultrasound guidance  2.  Left leg venous thrombectomy inferior vena cava left common iliac external iliac common femoral popliteal vein  3.  Intravascular ultrasound of inferior vena cava left common iliac external iliac common femoral superficial femoral popliteal vein  4. Left common and external iliac artery venous stent (16 x 90 x 2)  No signs of phlegmasia with motor, palpable DP pulse and sensation intact Elevation of left LE, ted hose on currently.  She will need thigh compression 20-30 mm hg ordered today Cont. Heparin Source work per Primary Leukocytosis 17.1   Roxy Horseman 02/07/2021 7:44 AM --  Laboratory Lab Results: Recent Labs    02/06/21 0535 02/06/21 1917  WBC 11.9* 17.1*  HGB 12.0 12.2  HCT 36.7 37.1  PLT 178 169    BMET Recent Labs    02/05/21 2032 02/06/21 0535  NA 138 138  K 5.1 3.6  CL 108 107  CO2 21* 20*  GLUCOSE 214* 174*  BUN 10 8  CREATININE 0.72 0.69  CALCIUM 8.6* 8.5*    COAG Lab Results  Component Value Date   INR 1.3 (H) 02/06/2021   INR 1.2 02/05/2021   INR 1.13 04/20/2017   No results found for: PTT

## 2021-02-07 NOTE — Progress Notes (Signed)
PROGRESS NOTE    Diane [name]  KPV:[id number] DOB: 02-26-1946 DOA: 02/04/2021 PCP: [name], MD   Brief Narrative: 75 year old with past medical history significant for hypertension, noninsulin-dependent type 2 diabetes, rheumatoid arthritis, anxiety, depression, mild cognitive deficit, recent hospitalization from 3/24 2022 on 12/02/2020 for partial SBO which was treated nonoperatively presented to the ED complaining of left leg pain and swelling.  Evaluation in the ED Doppler of ER DVT morphine left common femoral vein, saphenous junction, left femoral vein, left proximal profunda vein, left popliteal vein, left posterior tibial veins and left peroneal veins.  Patient was a started on IV heparin and vascular surgery was consulted, s/p thrombectomy and thrombolysis.  Heparin was transition to Eliquis today.  Thigh length compression stockings and elevation of lower extremity was ordered by vascular surgery. Possible discharge tomorrow if remains stable.  Assessment & Plan:   Principal Problem:   Acute DVT (deep venous thrombosis) (HCC) Active Problems:   Hypertension   Type 2 diabetes mellitus with diabetic neuropathy, unspecified (HCC)   SBO (small bowel obstruction) (HCC)   Rheumatoid arthritis (HCC)   1-Acute extensive DVT of left lower extremity: S/p thrombectomy Doppler: DVT involving left common femoral vein, SF junction, left femoral vein, left proximal profunda vein, left popliteal vein, left posterior tibial veins, left peroneal veins. -Risk factors: Obesity, immobilization recent hospitalization.   -Switch heparin with Eliquis. -Vascular might put IVC filter, she will follow-up with them as an outpatient. -Vascular recommending thigh-high of her compression stockings and limb elevation. -PT evaluation as she lives alone.  2-Hypertension: Continue with amlodipine and as needed hydralazine  Noninsulin-dependent diabetes type 2, with diabetic neuropathy: Hemoglobin  A1c 7.3 on 01/22/2021 Continue with sliding scale insulin  Fever; remained afebrile over the past 24-hour.  Incentive spirometry.  Chest x ray ordered and was negative for infection.  Could be related to DVT>   Abdominal pain, Constipation:  Report pain maybe started 2 days ago. She ignore pain.  No BM since Sunday. She is passing gas.  KUB with mild stool burden and no abnormal bowel dilatation. -Continue with bowel regimen  Rheumatoid arthritis: Continue with methotrexate and Plaquenil  Anxiety, depression: Continue with Cymbalta  Hyperlipidemia: Continue with Crestor  Recent partial SBO: Recently admitted for partial SBO etiology thought to be due to stricture versus inflammation.  She was a started on prednisone 40 mg daily and plan is to follow-up with Dr. Collene Mares who will determine further  taper.   Estimated body mass index is 32.98 kg/m as calculated from the following:   Height as of this encounter: '5\' 8"'  (1.727 m).   Weight as of this encounter: 98.4 kg.   DVT prophylaxis: Eliquis Code Status: Full code Family Communication: Called daughter with no response. Disposition Plan:  Status is: Inpatient  Remains inpatient appropriate because:IV treatments appropriate due to intensity of illness or inability to take PO   Dispo: The patient is from: Home              Anticipated d/c is to: Home              Patient currently is not medically stable to d/c.   Difficult to place patient No   Consultants:   Vascular  Procedures:  Doppler  Antimicrobials:   Subjective: Patient continued to experience some left leg pain and edema.  Stating there is some improvement after the procedure.  Eating and drinking okay.  Objective: Vitals:   02/07/21 0413 02/07/21 0816 02/07/21 1329  02/07/21 1625  BP: 123/73 112/63 (!) 142/77 129/73  Pulse: (!) 105 99 100   Resp: '16 17 20 17  ' Temp: 98 F (36.7 C) 98.5 F (36.9 C) 98.8 F (37.1 C) 98.2 F (36.8 C)  TempSrc: Oral  Oral Oral Oral  SpO2: 100% 100% 100% 100%  Weight:      Height:        Intake/Output Summary (Last 24 hours) at 02/07/2021 1645 Last data filed at 02/07/2021 0636 Gross per 24 hour  Intake 1352.87 ml  Output 350 ml  Net 1002.87 ml   Filed Weights   02/04/21 1800  Weight: 98.4 kg    Examination:  General.  Obese lady, in no acute distress. Pulmonary.  Lungs clear bilaterally, normal respiratory effort. CV.  Regular rate and rhythm, no JVD, rub or murmur. Abdomen.  Soft, nontender, nondistended, BS positive. CNS.  Alert and oriented x3.  No focal neurologic deficit. Extremities.  LLE edema, no cyanosis, pulses intact and symmetrical. Psychiatry.  Judgment and insight appears normal.   Data Reviewed: I have personally reviewed following labs and imaging studies  CBC: Recent Labs  Lab 02/04/21 2044 02/06/21 0535 02/06/21 1917 02/07/21 0828  WBC 10.4 11.9* 17.1* 11.4*  HGB 12.2 12.0 12.2 9.9*  HCT 37.4 36.7 37.1 30.8*  MCV 90.3 90.0 90.3 90.3  PLT 157 178 169 694*   Basic Metabolic Panel: Recent Labs  Lab 02/04/21 2044 02/05/21 2032 02/06/21 0535 02/07/21 0828  NA 138 138 138 139  K 3.7 5.1 3.6 3.5  CL 108 108 107 110  CO2 21* 21* 20* 25  GLUCOSE 131* 214* 174* 138*  BUN '8 10 8 ' 7*  CREATININE 0.52 0.72 0.69 0.59  CALCIUM 8.6* 8.6* 8.5* 7.8*   GFR: Estimated Creatinine Clearance: 75.7 mL/min (by C-G formula based on SCr of 0.59 mg/dL). Liver Function Tests: No results for input(s): AST, ALT, ALKPHOS, BILITOT, PROT, ALBUMIN in the last 168 hours. No results for input(s): LIPASE, AMYLASE in the last 168 hours. No results for input(s): AMMONIA in the last 168 hours. Coagulation Profile: Recent Labs  Lab 02/05/21 0248 02/06/21 0535 02/07/21 0828  INR 1.2 1.3* 1.6*   Cardiac Enzymes: No results for input(s): CKTOTAL, CKMB, CKMBINDEX, TROPONINI in the last 168 hours. BNP (last 3 results) No results for input(s): PROBNP in the last 8760 hours. HbA1C: No  results for input(s): HGBA1C in the last 72 hours. CBG: Recent Labs  Lab 02/06/21 2357 02/07/21 0415 02/07/21 0826 02/07/21 1332 02/07/21 1630  GLUCAP 229* 150* 134* 306* 224*   Lipid Profile: No results for input(s): CHOL, HDL, LDLCALC, TRIG, CHOLHDL, LDLDIRECT in the last 72 hours. Thyroid Function Tests: No results for input(s): TSH, T4TOTAL, FREET4, T3FREE, THYROIDAB in the last 72 hours. Anemia Panel: No results for input(s): VITAMINB12, FOLATE, FERRITIN, TIBC, IRON, RETICCTPCT in the last 72 hours. Sepsis Labs: No results for input(s): PROCALCITON, LATICACIDVEN in the last 168 hours.  Recent Results (from the past 240 hour(s))  SARS CORONAVIRUS 2 (TAT 6-24 HRS) Nasopharyngeal Nasopharyngeal Swab     Status: None   Collection Time: 02/04/21  6:03 PM   Specimen: Nasopharyngeal Swab  Result Value Ref Range Status   SARS Coronavirus 2 NEGATIVE NEGATIVE Final    Comment: (NOTE) SARS-CoV-2 target nucleic acids are NOT DETECTED.  The SARS-CoV-2 RNA is generally detectable in upper and lower respiratory specimens during the acute phase of infection. Negative results do not preclude SARS-CoV-2 infection, do not rule out co-infections with other pathogens,  and should not be used as the sole basis for treatment or other patient management decisions. Negative results must be combined with clinical observations, patient history, and epidemiological information. The expected result is Negative.  Fact Sheet for Patients: SugarRoll.be  Fact Sheet for Healthcare Providers: https://www.woods-mathews.com/  This test is not yet approved or cleared by the [place] FDA and  has been authorized for detection and/or diagnosis of SARS-CoV-2 by FDA under an Emergency Use Authorization (EUA). This EUA will remain  in effect (meaning this test can be used) for the duration of the COVID-19 declaration under Se ction 564(b)(1) of the Act, 21  U.S.C. section 360bbb-3(b)(1), unless the authorization is terminated or revoked sooner.  Performed at [facility], [place] [address], [place], [place] [address]   Culture, blood (routine x 2)     Status: None (Preliminary result)   Collection Time: 02/06/21  8:20 AM   Specimen: BLOOD LEFT HAND  Result Value Ref Range Status   Specimen Description BLOOD LEFT HAND  Final   Special Requests   Final    BOTTLES DRAWN AEROBIC ONLY Blood Culture results may not be optimal due to an inadequate volume of blood received in culture bottles   Culture   Final    NO GROWTH 1 DAY Performed at [facility], [place] [address] [place], [place] [address]    Report Status PENDING  Incomplete  Culture, blood (routine x 2)     Status: None (Preliminary result)   Collection Time: 02/06/21  8:20 AM   Specimen: BLOOD RIGHT HAND  Result Value Ref Range Status   Specimen Description BLOOD RIGHT HAND  Final   Special Requests   Final    BOTTLES DRAWN AEROBIC AND ANAEROBIC Blood Culture adequate volume   Culture   Final    NO GROWTH 1 DAY Performed at [facility], [place] [address], [place], [place] [address]    Report Status PENDING  Incomplete    Radiology Studies: DG Abd 1 View  Result Date: 02/06/2021 CLINICAL DATA:  Constipation. EXAM: ABDOMEN - 1 VIEW COMPARISON:  January 24, 2021. FINDINGS: No abnormal bowel dilatation is noted. Status post cholecystectomy. Mild amount of stool seen throughout the colon and rectum. No radio-opaque calculi or other significant radiographic abnormality are seen. IMPRESSION: Mild stool burden.  No abnormal bowel dilatation. Electronically Signed   By: Marijo Conception M.D.   On: 02/06/2021 11:37   PERIPHERAL VASCULAR CATHETERIZATION  Result Date: 02/06/2021 Procedure: 1.  Attempted right internal jugular vein central line, placement of left internal jugular vein triple-lumen catheter using ultrasound guidance 2.  Left leg venous thrombectomy inferior vena cava  left common iliac external iliac common femoral popliteal vein 3.  Intravascular ultrasound of inferior vena cava left common iliac external iliac common femoral superficial femoral popliteal vein 4. Left common and external iliac artery venous stent (16 x 90 x 2) Operative details: After obtaining informed consent, the patient was brought to the [facility] lab.  The patient was placed in supine position on the angio table.  Both sides of her neck and chest were prepped and draped in usual sterile fashion.  Local anesthesia was infiltrated over the right internal jugular vein.  Ultrasound was used to identify the right internal jugular vein which had normal respiratory variation compressibility.  Several attempts were made to cannulate the right internal jugular vein under ultrasound guidance using a micropuncture and a standard introducer needle.  These were all  unsuccessful.  At this point attempts were aborted on the right side.  Attention was turned to the left neck.  Left internal jugular vein also had normal compressibility and respiratory variation.  Using ultrasound guidance after infiltration of local anesthesia the left internal jugular vein was successfully cannulated and an 035 J-tip guidewire threaded in the central circulation.  The tract was dilated and a triple-lumen catheter placed over the guidewire into the central circulation.  This was noted to flush and draw easily.  All 3 ports flushed and drew easily.  The catheter was sutured the skin with silk sutures. At this point the patient was taken off the table and flipped into a prone position for accessing the left popliteal vein.  Sterile prep and drape was performed of the left popliteal fossa.  Ultrasound was used to identify the popliteal vein and popliteal artery.  This was fairly difficult secondary to a large amount of edema in the leg.  Micropuncture needle was used to cannulate the vein.  Micropuncture wire was advanced into the venous system.   Micropuncture sheath was then placed over this.  On removing the dilator and guidewire were I found that we were in the popliteal artery.  Therefore the entire apparatus was removed and hemostasis obtained with direct pressure for 5 minutes.  At this point ultrasound was again used to identify the popliteal vein.  Micropuncture needle was used to cannulate the popliteal vein and a guidewire advanced.  On this occasion the blood was much darker in character and the appearance of venous blood.  A micro puncture sheath was placed over this.  Dilator and guidewire was removed and there was good venous return.  The patient was given 12,000 units of intravenous heparin.  She was given an additional 5000 units of heparin during the course of the case.  She was started on a heparin drip at 2000 units/h at the conclusion of the case.  035 Amplatz wire was then threaded up to the level of the left subclavian vein.  An 8 French sheath was placed over this.  Intravascular ultrasound was then performed of the inferior vena cava common iliac external iliac common femoral superficial femoral and popliteal veins.  There was diffuse thrombus starting from the distal inferior vena cava and extending all the way down the left leg.  The 8 French sheath was then swapped out over the Amplatz wire for the NRE sheath.  The NRA device was deployed at approximately the L to the L3 vertebral body to capture all of the thrombus.  We measured the IVC with IVUS prior to this to make sure the NRA device would fit.  4 passes were made with retrieval of a large amount of thrombus.  At this point IVUS was placed back over the guidewire and all the thrombus had essentially been cleared.  There was a narrowing of the left common iliac vein right at its origin between 50 and 70%.  A 16 x 90 Wallstent was then brought up in the operative field and deployed high above the iliac bifurcation.  As we fully deployed the stent unfortunately this failed down  below the area of narrowing.  This required a second 16 x 90 stent.  We were able to deploy this and have it fall right at the origin of the left common iliac about 2 mm below its origin.  This was postdilated with a 16 balloon.  To 6 atm.  I then ballooned all of this all  the way down to the edge of the previous stent.  Repeat IVUS was performed which showed a widely patent lumen of the left common iliac vein extending all the way to the origin and fully deployed stent.  I then placed an 035 Berenstein catheter over this and placed it at the iliac confluence.  Contrast angiogram was performed which showed again the stent was about 2 mm below the origin of the left common iliac vein and the right iliac vein was not compromised or the IVC.  At this point guidewires and sheaths were removed a Monocryl stitch was used to close the area of the popliteal fossa.  Hemostasis was obtained.  The patient was flipped back to a supine position and taken to the holding area.  The patient was in stable condition.  There were no complications. Operative management: Patient will be maintained on an heparin drip and needs full oral anticoagulation with overlap as she would be high risk of rethrombosis without complete overlap of her oral anticoagulation.  Once she is fully anticoagulated with oral anticoagulation she can discharge home from our standpoint. Dr. Scot Dock is on call this weekend if there are any questions. Chest x-ray will be obtained in the holding area to confirm the central line is in good position and no pneumothorax. Ruta Hinds, MD [facility] Office: [phone]   DG Chest Port 1 View  Result Date: 02/06/2021 CLINICAL DATA:  Central catheter placement EXAM: PORTABLE CHEST 1 VIEW COMPARISON:  February 06, 2021 chest radiograph obtained earlier in the day FINDINGS: Central catheter tip in superior vena cava. No pneumothorax. Subtle area of opacity noted left upper lobe. Lungs  otherwise clear. Heart mildly enlarged with pulmonary vascularity normal. No adenopathy. There is aortic atherosclerosis. There is a total shoulder replacement on the right. IMPRESSION: Central catheter tip in superior vena cava. No pneumothorax. Suspect small focus of developing pneumonia left upper lobe. Lungs otherwise clear. Stable cardiac prominence. Aortic Atherosclerosis (ICD10-I70.0). Electronically Signed   By: Lowella Grip III M.D.   On: 02/06/2021 16:25   DG CHEST PORT 1 VIEW  Result Date: 02/06/2021 CLINICAL DATA:  Fever EXAM: PORTABLE CHEST 1 VIEW COMPARISON:  01/23/2021 FINDINGS: No new consolidation or edema. No significant pleural effusion. Similar cardiomediastinal contours. Partially imaged reverse right shoulder arthroplasty. IMPRESSION: No acute process in the chest. Electronically Signed   By: Macy Mis M.D.   On: 02/06/2021 08:57   Scheduled Meds: . amLODipine  5 mg Oral Daily  . apixaban  10 mg Oral BID   Followed by  . [START ON 02/14/2021] apixaban  5 mg Oral BID  . bisacodyl  10 mg Rectal Once  . Chlorhexidine Gluconate Cloth  6 each Topical Daily  . DULoxetine  30 mg Oral Daily  . folic acid  1 mg Oral Daily  . gabapentin  300 mg Oral BID  . hydroxychloroquine  200 mg Oral Daily  . insulin aspart  0-9 Units Subcutaneous Q4H  . [START ON 02/08/2021] methotrexate  20 mg Oral Q Sun  . predniSONE  40 mg Oral Q breakfast  . rosuvastatin  10 mg Oral Daily  . sodium chloride flush  3 mL Intravenous Q12H   Continuous Infusions: . sodium chloride 100 mL/hr at 02/06/21 0600  . sodium chloride       LOS: 3 days    Time spent: 35 minutes.   This record has been created using Systems analyst. Errors have been sought  and corrected,but may not always be located. Such creation errors do not reflect on the standard of care.  Lorella Nimrod, MD [facility]   If 7PM-7AM, please contact night-coverage [email or web address]  02/07/2021, 4:45 PM

## 2021-02-08 LAB — CBC
HCT: 28 % — ABNORMAL LOW (ref 36.0–46.0)
Hemoglobin: 9.1 g/dL — ABNORMAL LOW (ref 12.0–15.0)
MCH: 29.5 pg (ref 26.0–34.0)
MCHC: 32.5 g/dL (ref 30.0–36.0)
MCV: 90.9 fL (ref 80.0–100.0)
Platelets: 146 10*3/uL — ABNORMAL LOW (ref 150–400)
RBC: 3.08 MIL/uL — ABNORMAL LOW (ref 3.87–5.11)
RDW: 15.7 % — ABNORMAL HIGH (ref 11.5–15.5)
WBC: 9.8 10*3/uL (ref 4.0–10.5)
nRBC: 0 % (ref 0.0–0.2)

## 2021-02-08 LAB — PROTIME-INR
INR: 2 — ABNORMAL HIGH (ref 0.8–1.2)
Prothrombin Time: 21.7 seconds — ABNORMAL HIGH (ref 11.4–15.2)

## 2021-02-08 LAB — GLUCOSE, CAPILLARY
Glucose-Capillary: 148 mg/dL — ABNORMAL HIGH (ref 70–99)
Glucose-Capillary: 105 mg/dL — ABNORMAL HIGH (ref 70–99)
Glucose-Capillary: 138 mg/dL — ABNORMAL HIGH (ref 70–99)
Glucose-Capillary: 263 mg/dL — ABNORMAL HIGH (ref 70–99)
Glucose-Capillary: 207 mg/dL — ABNORMAL HIGH (ref 70–99)
Glucose-Capillary: 296 mg/dL — ABNORMAL HIGH (ref 70–99)
Glucose-Capillary: 265 mg/dL — ABNORMAL HIGH (ref 70–99)

## 2021-02-08 NOTE — Progress Notes (Addendum)
[facility]  S/P MECHANICAL THROMBECTOMY IVC LEFT ILIAC VEIN/LEFT COMMON ILIAC VEIN STENT: The patient is doing well status post mechanical thrombectomy.    She now has her thigh-high compression stockings. Start Eliquis. D/C heparin. Ambulate. Elevate legs when in bed.   Deitra Mayo, MD Office: [phone]  Subjective  - Has compression left LE thigh   Objective 132/62 82 98.3 F (36.8 C) (Oral) 18 100%  Intake/Output Summary (Last 24 hours) at 02/08/2021 0818 Last data filed at 02/08/2021 0000 Gross per 24 hour  Intake 200 ml  Output --  Net 200 ml    Palpable pedal pulse Compartments soft, motor intact Lungs non labored breathing Heart RRR  Assessment/Planning: POD # 2  1. Attempted right internal jugular vein central line, placement of left internal jugular vein triple-lumen catheter using ultrasound guidance  2. Left leg venous thrombectomyinferior vena cava left common iliac external iliac common femoral popliteal vein  3. Intravascular ultrasound of inferior vena cava left common iliac external iliac common femoral superficial femoral popliteal vein  4. Left common and external iliac artery venous stent (16 x 90 x 2)  We have started Eliquis per pharmacy today Motor, sensation and palpable DP pulse   Roxy Horseman 02/08/2021 8:18 AM --  Laboratory Lab Results: Recent Labs    02/07/21 0828 02/08/21 0400  WBC 11.4* 9.8  HGB 9.9* 9.1*  HCT 30.8* 28.0*  PLT 141* 146*   BMET Recent Labs    02/06/21 0535 02/07/21 0828  NA 138 139  K 3.6 3.5  CL 107 110  CO2 20* 25  GLUCOSE 174* 138*  BUN 8 7*  CREATININE 0.69 0.59  CALCIUM 8.5* 7.8*    COAG Lab Results  Component Value Date   INR 2.0 (H) 02/08/2021   INR 1.6 (H) 02/07/2021   INR 1.3 (H) 02/06/2021   No results found for: PTT

## 2021-02-08 NOTE — Plan of Care (Signed)

## 2021-02-08 NOTE — Progress Notes (Addendum)
PROGRESS NOTE    Diane [name]  AST:[id number] DOB: Oct 19, 1946 DOA: 02/04/2021 PCP: [name], MD   Brief Narrative: 75 year old with past medical history significant for hypertension, noninsulin-dependent type 2 diabetes, rheumatoid arthritis, anxiety, depression, mild cognitive deficit, recent hospitalization from 3/24 2022 on 12/02/2020 for partial SBO which was treated nonoperatively presented to the ED complaining of left leg pain and swelling.  Evaluation in the ED Doppler of ER DVT morphine left common femoral vein, saphenous junction, left femoral vein, left proximal profunda vein, left popliteal vein, left posterior tibial veins and left peroneal veins.  Patient was a started on IV heparin and vascular surgery was consulted, s/p thrombectomy and thrombolysis.  Heparin was transition to Eliquis today.  Thigh length compression stockings and elevation of lower extremity was ordered by vascular surgery. Possible discharge tomorrow if remains stable.  Assessment & Plan:   Principal Problem:   Acute DVT (deep venous thrombosis) (HCC) Active Problems:   Hypertension   Type 2 diabetes mellitus with diabetic neuropathy, unspecified (HCC)   SBO (small bowel obstruction) (HCC)   Rheumatoid arthritis (HCC)   Fever   1-Acute extensive DVT of left lower extremity: S/p thrombectomy Doppler: DVT involving left common femoral vein, SF junction, left femoral vein, left proximal profunda vein, left popliteal vein, left posterior tibial veins, left peroneal veins. -Risk factors: Obesity, immobilization recent hospitalization.   -Switch heparin with Eliquis. -Vascular might put IVC filter, she will follow-up with them as an outpatient. -Vascular recommending thigh-high of her compression stockings and limb elevation. -PT is recommending SNF placement. -TOC consult for SNF  2-Hypertension: Continue with amlodipine and as needed hydralazine  Noninsulin-dependent diabetes type 2, with  diabetic neuropathy: Hemoglobin A1c 7.3 on 01/22/2021 Continue with sliding scale insulin  Fever; remained afebrile over the past 48-hour.  Incentive spirometry.  Chest x ray  was negative for infection.  Could be related to DVT>   Abdominal pain, Constipation:  Report pain maybe started 2 days ago. She ignore pain.  No BM since Sunday. She is passing gas.  KUB with mild stool burden and no abnormal bowel dilatation. -Continue with bowel regimen  Rheumatoid arthritis: Continue with methotrexate, Plaquenil was discontinued by her provider recently.  Anxiety, depression: Continue with Cymbalta  Hyperlipidemia: Continue with Crestor  Recent partial SBO: Recently admitted for partial SBO etiology thought to be due to stricture versus inflammation.  She was a started on prednisone 40 mg daily and plan is to follow-up with Dr. Collene Mares who will determine further  taper.   Estimated body mass index is 32.98 kg/m as calculated from the following:   Height as of this encounter: 5\' 8"  (1.727 m).   Weight as of this encounter: 98.4 kg.   DVT prophylaxis: Eliquis Code Status: Full code Family Communication: Called daughter with no response. Disposition Plan:  Status is: Inpatient  Remains inpatient appropriate because:IV treatments appropriate due to intensity of illness or inability to take PO   Dispo: The patient is from: Home              Anticipated d/c is to: SNF              Patient currently is medically stable.   Difficult to place patient No   Consultants:   Vascular  Procedures:  Doppler  Antimicrobials:   Subjective: Left leg pain improving, she was working with PT when seen today.  No new complaints.  Objective: Vitals:   02/07/21 2050 02/07/21 2329 02/08/21 0335 02/08/21  0729  BP:  126/61 120/64 132/62  Pulse:  88 78 82  Resp: 18 17 16 18   Temp:  98 F (36.7 C) 98.3 F (36.8 C) 98.3 F (36.8 C)  TempSrc:  Oral Oral Oral  SpO2:  100% 100% 100%  Weight:       Height:        Intake/Output Summary (Last 24 hours) at 02/08/2021 1511 Last data filed at 02/08/2021 0000 Gross per 24 hour  Intake 200 ml  Output --  Net 200 ml   Filed Weights   02/04/21 1800  Weight: 98.4 kg    Examination:  General.  Well-developed elderly lady, in no acute distress. Pulmonary.  Lungs clear bilaterally, normal respiratory effort. CV.  Regular rate and rhythm, no JVD, rub or murmur. Abdomen.  Soft, nontender, nondistended, BS positive. CNS.  Alert and oriented x3.  No focal neurologic deficit. Extremities. LLE edema, little improved as compared to yesterday no cyanosis, pulses intact and symmetrical. Psychiatry.  Judgment and insight appears normal.   Data Reviewed: I have personally reviewed following labs and imaging studies  CBC: Recent Labs  Lab 02/04/21 2044 02/06/21 0535 02/06/21 1917 02/07/21 0828 02/08/21 0400  WBC 10.4 11.9* 17.1* 11.4* 9.8  HGB 12.2 12.0 12.2 9.9* 9.1*  HCT 37.4 36.7 37.1 30.8* 28.0*  MCV 90.3 90.0 90.3 90.3 90.9  PLT 157 178 169 141* 817*   Basic Metabolic Panel: Recent Labs  Lab 02/04/21 2044 02/05/21 2032 02/06/21 0535 02/07/21 0828  NA 138 138 138 139  K 3.7 5.1 3.6 3.5  CL 108 108 107 110  CO2 21* 21* 20* 25  GLUCOSE 131* 214* 174* 138*  BUN 8 10 8  7*  CREATININE 0.52 0.72 0.69 0.59  CALCIUM 8.6* 8.6* 8.5* 7.8*   GFR: Estimated Creatinine Clearance: 75.7 mL/min (by C-G formula based on SCr of 0.59 mg/dL). Liver Function Tests: No results for input(s): AST, ALT, ALKPHOS, BILITOT, PROT, ALBUMIN in the last 168 hours. No results for input(s): LIPASE, AMYLASE in the last 168 hours. No results for input(s): AMMONIA in the last 168 hours. Coagulation Profile: Recent Labs  Lab 02/05/21 0248 02/06/21 0535 02/07/21 0828 02/08/21 0400  INR 1.2 1.3* 1.6* 2.0*   Cardiac Enzymes: No results for input(s): CKTOTAL, CKMB, CKMBINDEX, TROPONINI in the last 168 hours. BNP (last 3 results) No results for  input(s): PROBNP in the last 8760 hours. HbA1C: No results for input(s): HGBA1C in the last 72 hours. CBG: Recent Labs  Lab 02/07/21 2031 02/07/21 2325 02/08/21 0334 02/08/21 0731 02/08/21 1113  GLUCAP 274* 182* 148* 105* 138*   Lipid Profile: No results for input(s): CHOL, HDL, LDLCALC, TRIG, CHOLHDL, LDLDIRECT in the last 72 hours. Thyroid Function Tests: No results for input(s): TSH, T4TOTAL, FREET4, T3FREE, THYROIDAB in the last 72 hours. Anemia Panel: No results for input(s): VITAMINB12, FOLATE, FERRITIN, TIBC, IRON, RETICCTPCT in the last 72 hours. Sepsis Labs: No results for input(s): PROCALCITON, LATICACIDVEN in the last 168 hours.  Recent Results (from the past 240 hour(s))  SARS CORONAVIRUS 2 (TAT 6-24 HRS) Nasopharyngeal Nasopharyngeal Swab     Status: None   Collection Time: 02/04/21  6:03 PM   Specimen: Nasopharyngeal Swab  Result Value Ref Range Status   SARS Coronavirus 2 NEGATIVE NEGATIVE Final    Comment: (NOTE) SARS-CoV-2 target nucleic acids are NOT DETECTED.  The SARS-CoV-2 RNA is generally detectable in upper and lower respiratory specimens during the acute phase of infection. Negative results do not preclude SARS-CoV-2  infection, do not rule out co-infections with other pathogens, and should not be used as the sole basis for treatment or other patient management decisions. Negative results must be combined with clinical observations, patient history, and epidemiological information. The expected result is Negative.  Fact Sheet for Patients: SugarRoll.be  Fact Sheet for Healthcare Providers: https://www.woods-mathews.com/  This test is not yet approved or cleared by the [place] FDA and  has been authorized for detection and/or diagnosis of SARS-CoV-2 by FDA under an Emergency Use Authorization (EUA). This EUA will remain  in effect (meaning this test can be used) for the duration of the COVID-19  declaration under Se ction 564(b)(1) of the Act, 21 U.S.C. section 360bbb-3(b)(1), unless the authorization is terminated or revoked sooner.  Performed at [facility], [place] [address], [place], [place] [address]   Culture, blood (routine x 2)     Status: None (Preliminary result)   Collection Time: 02/06/21  8:20 AM   Specimen: BLOOD LEFT HAND  Result Value Ref Range Status   Specimen Description BLOOD LEFT HAND  Final   Special Requests   Final    BOTTLES DRAWN AEROBIC ONLY Blood Culture results may not be optimal due to an inadequate volume of blood received in culture bottles   Culture   Final    NO GROWTH 2 DAYS Performed at [facility], [place] [address] [place], [place] [address]    Report Status PENDING  Incomplete  Culture, blood (routine x 2)     Status: None (Preliminary result)   Collection Time: 02/06/21  8:20 AM   Specimen: BLOOD RIGHT HAND  Result Value Ref Range Status   Specimen Description BLOOD RIGHT HAND  Final   Special Requests   Final    BOTTLES DRAWN AEROBIC AND ANAEROBIC Blood Culture adequate volume   Culture   Final    NO GROWTH 2 DAYS Performed at [facility], [place] [address], [place], [place] [address]    Report Status PENDING  Incomplete    Radiology Studies: DG Chest Port 1 View  Result Date: 02/06/2021 CLINICAL DATA:  Central catheter placement EXAM: PORTABLE CHEST 1 VIEW COMPARISON:  February 06, 2021 chest radiograph obtained earlier in the day FINDINGS: Central catheter tip in superior vena cava. No pneumothorax. Subtle area of opacity noted left upper lobe. Lungs otherwise clear. Heart mildly enlarged with pulmonary vascularity normal. No adenopathy. There is aortic atherosclerosis. There is a total shoulder replacement on the right. IMPRESSION: Central catheter tip in superior vena cava. No pneumothorax. Suspect small focus of developing pneumonia left upper lobe. Lungs otherwise clear. Stable cardiac prominence. Aortic  Atherosclerosis (ICD10-I70.0). Electronically Signed   By: Lowella Grip III M.D.   On: 02/06/2021 16:25   Scheduled Meds: . amLODipine  5 mg Oral Daily  . apixaban  10 mg Oral BID   Followed by  . [START ON 02/14/2021] apixaban  5 mg Oral BID  . bisacodyl  10 mg Rectal Once  . Chlorhexidine Gluconate Cloth  6 each Topical Daily  . DULoxetine  30 mg Oral Daily  . folic acid  1 mg Oral Daily  . gabapentin  300 mg Oral BID  . insulin aspart  0-9 Units Subcutaneous Q4H  . methotrexate  20 mg Oral Q Sun  . predniSONE  40 mg Oral Q breakfast  . rosuvastatin  10 mg Oral Daily  . sodium chloride flush  3 mL Intravenous Q12H   Continuous Infusions: .  sodium chloride 100 mL/hr at 02/06/21 0600  . sodium chloride       LOS: 4 days    Time spent: 30 minutes.   This record has been created using Systems analyst. Errors have been sought and corrected,but may not always be located. Such creation errors do not reflect on the standard of care.  Lorella Nimrod, MD [facility]   If 7PM-7AM, please contact night-coverage [email or web address]  02/08/2021, 3:11 PM

## 2021-02-08 NOTE — NC FL2 (Signed)
[place] MEDICAID FL2 LEVEL OF CARE SCREENING TOOL     IDENTIFICATION  [name] Birthdate: May 28, 1946 Sex: female Admission Date (Current Location): 02/04/2021  [facility] and [place] Number:  Herbalist and Address:  [facility], [place] [address] [place], [place] [address]      Provider Number: [id number]  Attending Physician Name and Address:  Lorella Nimrod, MD  Relative Name and Phone Number:  Lauro Regulus [phone]    Current Level of Care: Hospital Recommended Level of Care: [place] Prior Approval Number:    Date Approved/Denied:   PASRR Number: [id number]  Discharge Plan: SNF    Current Diagnoses: Patient Active Problem List   Diagnosis Date Noted  . Fever   . Acute DVT (deep venous thrombosis) ([place]) 02/04/2021  . AKI (acute kidney injury) ([place]) 01/23/2021  . Rheumatoid arthritis ([place]) 01/23/2021  . SBO (small bowel obstruction) ([place]) 01/22/2021  . Postoperative anemia due to acute blood loss 06/18/2018  . Type 2 diabetes mellitus with diabetic neuropathy, unspecified ([place]) 06/18/2018  . Peripheral neuropathy 06/18/2018  . 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 Gait difficulty 06/18/2018  . Community acquired pneumonia of right lower lobe of lung 06/11/2018  . S/P shoulder replacement, right 06/02/2018  . Prolonged Q-T interval on ECG 04/20/2017  . Pulmonary edema 04/20/2017  . Chest pain 10/18/2013  . Abnormal nuclear stress test 10/18/2013  . Arm pain 10/18/2013  . Hypertension     Orientation RESPIRATION BLADDER Height & Weight     Self,Time,Situation,Place  Normal Continent,External catheter Weight: 216 lb 14.9 oz (98.4 kg) Height:  5\' 8"  (416.3 cm)  BEHAVIORAL SYMPTOMS/MOOD NEUROLOGICAL BOWEL NUTRITION STATUS      Continent Diet (See discharge summary)  AMBULATORY STATUS COMMUNICATION OF NEEDS Skin   Limited Assist Verbally Surgical wounds (left knee)                        Personal Care Assistance Level of Assistance  Bathing,Feeding,Dressing Bathing Assistance: Limited assistance Feeding assistance: Independent Dressing Assistance: Limited assistance     Functional Limitations Info  Sight,Hearing,Speech Sight Info: Impaired Hearing Info: Adequate Speech Info: Adequate    SPECIAL CARE FACTORS FREQUENCY  PT (By licensed PT),OT (By licensed OT)     PT Frequency: 5X per week OT Frequency: 5x per week            Contractures Contractures Info: Not present    Additional Factors Info  Insulin Sliding Scale Code Status Info: Full Allergies Info: Cephalexin, Codeine, Nucynta (Tapentadol Hcl)   Insulin Sliding Scale Info: insulin aspart (novoLOG) injection 0-9 Units       Current Medications (02/08/2021):  This is the current hospital active medication list Current Facility-Administered Medications  Medication Dose Route Frequency Provider Last Rate Last Admin  . 0.9 %  sodium chloride infusion   Intravenous Continuous Elam Dutch, MD 100 mL/hr at 02/06/21 0600 Infusion Verify at 02/06/21 0600  . 0.9 %  sodium chloride infusion  250 mL Intravenous PRN Elam Dutch, MD      . acetaminophen (TYLENOL) tablet 650 mg  650 mg Oral Q6H PRN Shela Leff, MD   650 mg at 02/06/21 8453   Or  . acetaminophen (TYLENOL) suppository 650 mg  650 mg Rectal Q6H PRN Shela Leff, MD      . amLODipine (NORVASC) tablet 5 mg  5 mg Oral Daily Shela Leff, MD  5 mg at 02/08/21 1101  . apixaban (ELIQUIS) tablet 10 mg  10 mg Oral BID Amedeo Plenty, RPH   10 mg at 02/08/21 1101   Followed by  . [START ON 02/14/2021] apixaban (ELIQUIS) tablet 5 mg  5 mg Oral BID Amedeo Plenty, [place]      . bisacodyl (DULCOLAX) suppository 10 mg  10 mg Rectal Once Regalado, Belkys A, MD      . Chlorhexidine Gluconate Cloth 2 % PADS 6 each  6 each Topical Daily Regalado, Belkys A, MD   6 each at 02/07/21 1041  . DULoxetine (CYMBALTA) DR capsule 30 mg   30 mg Oral Daily Shela Leff, MD   30 mg at 02/08/21 1101  . folic acid (FOLVITE) tablet 1 mg  1 mg Oral Daily Shela Leff, MD   1 mg at 02/08/21 1100  . gabapentin (NEURONTIN) capsule 300 mg  300 mg Oral BID Shela Leff, MD   300 mg at 02/08/21 1100  . hydrALAZINE (APRESOLINE) injection 5 mg  5 mg Intravenous Q4H PRN Shela Leff, MD   5 mg at 02/05/21 0033  . hydrALAZINE (APRESOLINE) injection 5 mg  5 mg Intravenous Q20 Min PRN Elam Dutch, MD      . HYDROmorphone (DILAUDID) injection 0.5 mg  0.5 mg Intravenous Q4H PRN Shela Leff, MD   0.5 mg at 02/05/21 0033  . insulin aspart (novoLOG) injection 0-9 Units  0-9 Units Subcutaneous Q4H Shela Leff, MD   1 Units at 02/08/21 0349  . labetalol (NORMODYNE) injection 10 mg  10 mg Intravenous Q10 min PRN Elam Dutch, MD      . methotrexate (RHEUMATREX) tablet 20 mg  20 mg Oral Q Adela Ports, Wandra Feinstein, MD   20 mg at 02/08/21 1101  . morphine 2 MG/ML injection 2 mg  2 mg Intravenous Q1H PRN Elam Dutch, MD      . oxyCODONE (Oxy IR/ROXICODONE) immediate release tablet 5-10 mg  5-10 mg Oral Q4H PRN Elam Dutch, MD      . predniSONE (DELTASONE) tablet 40 mg  40 mg Oral Q breakfast Shela Leff, MD   40 mg at 02/08/21 1100  . rosuvastatin (CRESTOR) tablet 10 mg  10 mg Oral Daily Shela Leff, MD   10 mg at 02/08/21 1101  . sodium chloride flush (NS) 0.9 % injection 3 mL  3 mL Intravenous Q12H Elam Dutch, MD   3 mL at 02/07/21 2237  . sodium chloride flush (NS) 0.9 % injection 3 mL  3 mL Intravenous PRN Elam Dutch, MD         Discharge Medications: Please see discharge summary for a list of discharge medications.  Relevant Imaging Results:  Relevant Lab Results:   Additional Information SSN# [id number] pt is vaccinated, not had booster  Bary Castilla, LCSW

## 2021-02-08 NOTE — TOC Initial Note (Signed)
Transition of Care [facility]) - Initial/Assessment Note    Patient Details  Name: Diane [name] MRN: [id number] Date of Birth: [birth year]  Transition of Care [facility]) CM/SW Contact:    Bary Castilla, LCSW Phone Number: [phone] 02/08/2021, 4:09 PM  Clinical Narrative:                 CSW met with patient to discuss PT recommendation of a SNF. Patient was aware of recommendation and in agreement with going to a ST SNF. CSW discussed the SNF process.CSW provided patient with medicare.gov rating list.  Patient gave CSW permission to fax referrals out to local facilities.CSW answered questions about the SNF process and the next steps in the process. Patient explained that she has been to [facility] and does not mind going back if available.   Insurance auth started for 4/11 and reference number [id number]. Facility will need to be given once chosen.  TOC team will continue to assist with discharge planning needs.    Expected Discharge Plan: Skilled Nursing Facility Barriers to Discharge: SNF Pending bed offer,Continued Medical Work up,Insurance Authorization   Patient Goals and CMS Choice   CMS Medicare.gov Compare Post Acute Care list provided to:: Patient Choice offered to / list presented to : Patient  Expected Discharge Plan and Services Expected Discharge Plan: [place] arrangements for the past 2 months: Single Family Home                                      Prior Living Arrangements/Services Living arrangements for the past 2 months: Single Family Home Lives with:: Self   Do you feel safe going back to the place where you live?: Yes        Care giver support system in place?: Yes (comment)      Activities of Daily Living   ADL Screening (condition at time of admission) Patient's cognitive ability adequate to safely complete daily activities?: No Is the patient deaf or have difficulty hearing?: No Does the patient have difficulty  seeing, even when wearing glasses/contacts?: No  Permission Sought/Granted      Share Information with NAME: Lauro Regulus  Permission granted to share info w AGENCY: SNFs  Permission granted to share info w Relationship: Daughter  Permission granted to share info w Contact Information: [phone]  Emotional Assessment Appearance:: Appears older than stated age Attitude/Demeanor/Rapport: Engaged Affect (typically observed): Accepting Orientation: : Oriented to Self,Oriented to Place,Oriented to  Time,Oriented to Situation      Admission diagnosis:  Acute DVT (deep venous thrombosis) (HCC) [I82.409] Acute deep vein thrombosis (DVT) of femoral vein of left lower extremity ([place]) [I82.412] Patient Active Problem List   Diagnosis Date Noted  . Fever   . Acute DVT (deep venous thrombosis) ([place]) 02/04/2021  . AKI (acute kidney injury) ([place]) 01/23/2021  . Rheumatoid arthritis ([place]) 01/23/2021  . SBO (small bowel obstruction) ([place]) 01/22/2021  . Postoperative anemia due to acute blood loss 06/18/2018  . Type 2 diabetes mellitus with diabetic neuropathy, unspecified ([place]) 06/18/2018  . Peripheral neuropathy 06/18/2018  . 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 Gait difficulty 06/18/2018  . Community acquired pneumonia of right lower lobe of lung 06/11/2018  . S/P shoulder replacement, right 06/02/2018  . Prolonged Q-T interval on ECG 04/20/2017  . Pulmonary edema 04/20/2017  . Chest pain 10/18/2013  .  Abnormal nuclear stress test 10/18/2013  . Arm pain 10/18/2013  . Hypertension    PCP:  [name], MD Pharmacy:   [place], [address] [place] [address] Phone: [phone] Fax: [phone]     Social Determinants of Health (SDOH) Interventions    Readmission Risk Interventions No flowsheet data found.

## 2021-02-08 NOTE — Evaluation (Signed)
Physical Therapy Evaluation Patient Details Name: Diane [name] MRN: [id number] DOB: 1946-09-18 Today's Date: 02/08/2021   History of Present Illness  75 y.o. female presents to [facility] ED on 02/04/2021 with LLE swelling and pain. Pt recently discharged from hospital for SBO on 4/1. Pt found to have LLE DVT. Pt underwent LLE venous thrombectomy on 4/8 with stent placement. PMH significant for hypertension, noninsulin-dependent type 2 diabetes, rheumatoid arthritis, anxiety, depression, mild cognitive deficit.  Clinical Impression  Pt presents to PT with deficits in strength, power, functional mobility, gait, balance, and endurance. Pt is limited by R shoulder pain and by LE weakness. Pt requires physical assistance for bed mobility and transfers to prevent falls, and activity tolerance remains limited at this time. Pt will benefit from aggressive mobilization and acute PT services to improve LE strength and to restore independence in mobility. PT recommends SNF placement at this time as the pt lives alone and does not have necessary physical assistance during the day to mobilize safely in the home.    Follow Up Recommendations SNF    Equipment Recommendations   (TBD, need to confirm if pt has RW or 4 wheeled walker)    Recommendations for Other Services       Precautions / Restrictions Precautions Precautions: Fall Restrictions Weight Bearing Restrictions: No      Mobility  Bed Mobility Overal bed mobility: Needs Assistance Bed Mobility: Rolling;Sidelying to Sit Rolling: Min assist Sidelying to sit: Mod assist       General bed mobility comments: assistance to elevate trunk due to RUE pain    Transfers Overall transfer level: Needs assistance Equipment used: Rolling walker (2 wheeled) Transfers: Sit to/from Stand Sit to Stand: Mod assist;Min assist (modA progressing to minA)         General transfer comment: pt with LE weakness requiring assistance to power up.  Verbal/visual/tactile cues for increased trunk flexion and hand placement  Ambulation/Gait Ambulation/Gait assistance: Min guard Gait Distance (Feet): 50 Feet (50' x 2) Assistive device: Rolling walker (2 wheeled) Gait Pattern/deviations: Step-to pattern Gait velocity: reduced Gait velocity interpretation: <1.31 ft/sec, indicative of household ambulator General Gait Details: pt with slowed step-to gait, reduced foot clearance bilaterally  Stairs            Wheelchair Mobility    Modified Rankin (Stroke Patients Only)       Balance Overall balance assessment: Needs assistance Sitting-balance support: No upper extremity supported;Feet supported Sitting balance-Leahy Scale: Good     Standing balance support: Single extremity supported;Bilateral upper extremity supported Standing balance-Leahy Scale: Poor Standing balance comment: reliant on unilateral UE support                             Pertinent Vitals/Pain Pain Assessment: Faces Faces Pain Scale: Hurts even more Pain Location: R shoulder and UE Pain Descriptors / Indicators: Grimacing Pain Intervention(s): Monitored during session    Home Living Family/patient expects to be discharged to:: Private residence Living Arrangements: Alone Available Help at Discharge: Family;Available PRN/intermittently (children all work during day) Type of Home: House Home Access: Level entry     Home Layout: Two level [place]: Hand held shower head;Grab bars - tub/shower;Walker - 4 wheels      Prior Function Level of Independence: Needs assistance   Gait / Transfers Assistance Needed: Independent with ambulation  ADL's / Homemaking Assistance Needed: Reports daughter assists with bathing and dressing as needed.  Hand Dominance   Dominant Hand: Right    Extremity/Trunk Assessment   Upper Extremity Assessment Upper Extremity Assessment: RUE deficits/detail RUE Deficits / Details: limited by  pain, pt refusing full assessment and often declines use of extremity for ADL tasks but is able to utilize UE to push into standing and WB through RW    Lower Extremity Assessment Lower Extremity Assessment: LLE deficits/detail LLE Deficits / Details: pain related weakness, at least 3/5 based on observed mobility at this time    Cervical / Trunk Assessment Cervical / Trunk Assessment: Normal  Communication   Communication: No difficulties  Cognition Arousal/Alertness: Awake/alert Behavior During Therapy: WFL for tasks assessed/performed Overall Cognitive Status: Within Functional Limits for tasks assessed                                        General Comments General comments (skin integrity, edema, etc.): VSS on RA    Exercises     Assessment/Plan    PT Assessment Patient needs continued PT services  PT Problem List Decreased strength;Decreased activity tolerance;Decreased balance;Decreased mobility;Decreased knowledge of use of DME;Pain       PT Treatment Interventions DME instruction;Gait training;Stair training;Therapeutic activities;Functional mobility training;Therapeutic exercise;Balance training;Patient/family education    PT Goals (Current goals can be found in the Care Plan section)  Acute Rehab PT Goals Patient Stated Goal: To improve strength and mobility while reducing falls risk PT Goal Formulation: With patient Time For Goal Achievement: 02/22/21 Potential to Achieve Goals: Good    Frequency Min 3X/week (maintain 3x for possible progression to home)   Barriers to discharge        Co-evaluation               AM-PAC PT "6 Clicks" Mobility  Outcome Measure Help needed turning from your back to your side while in a flat bed without using bedrails?: A Little Help needed moving from lying on your back to sitting on the side of a flat bed without using bedrails?: A Lot Help needed moving to and from a bed to a chair (including a  wheelchair)?: A Little Help needed standing up from a chair using your arms (e.g., wheelchair or bedside chair)?: A Little Help needed to walk in hospital room?: A Little Help needed climbing 3-5 steps with a railing? : A Lot 6 Click Score: 16    End of Session Equipment Utilized During Treatment: Gait belt Activity Tolerance: Patient tolerated treatment well Patient left: in chair;with call bell/phone within reach;with chair alarm set Nurse Communication: Mobility status PT Visit Diagnosis: Unsteadiness on feet (R26.81);Other abnormalities of gait and mobility (R26.89);Muscle weakness (generalized) (M62.81);Pain Pain - Right/Left: Right Pain - part of body: Shoulder    Time: 1017-5102 PT Time Calculation (min) (ACUTE ONLY): 38 min   Charges:   PT Evaluation $PT Eval Low Complexity: 1 Low PT Treatments $Gait Training: 8-22 mins        Zenaida Niece, PT, DPT Acute Rehabilitation Pager: [phone]   Zenaida Niece 02/08/2021, 11:48 AM

## 2021-02-09 ENCOUNTER — Encounter (HOSPITAL_COMMUNITY): Payer: Self-pay | Admitting: Vascular Surgery

## 2021-02-09 LAB — CBC
HCT: 28.7 % — ABNORMAL LOW (ref 36.0–46.0)
Hemoglobin: 9.5 g/dL — ABNORMAL LOW (ref 12.0–15.0)
MCH: 29.8 pg (ref 26.0–34.0)
MCHC: 33.1 g/dL (ref 30.0–36.0)
MCV: 90 fL (ref 80.0–100.0)
Platelets: 161 10*3/uL (ref 150–400)
RBC: 3.19 MIL/uL — ABNORMAL LOW (ref 3.87–5.11)
RDW: 15.2 % (ref 11.5–15.5)
WBC: 8.2 10*3/uL (ref 4.0–10.5)
nRBC: 0 % (ref 0.0–0.2)

## 2021-02-09 LAB — GLUCOSE, CAPILLARY
Glucose-Capillary: 117 mg/dL — ABNORMAL HIGH (ref 70–99)
Glucose-Capillary: 188 mg/dL — ABNORMAL HIGH (ref 70–99)
Glucose-Capillary: 230 mg/dL — ABNORMAL HIGH (ref 70–99)
Glucose-Capillary: 237 mg/dL — ABNORMAL HIGH (ref 70–99)
Glucose-Capillary: 240 mg/dL — ABNORMAL HIGH (ref 70–99)
Glucose-Capillary: 293 mg/dL — ABNORMAL HIGH (ref 70–99)

## 2021-02-09 LAB — CARDIOLIPIN ANTIBODIES, IGG, IGM, IGA
Anticardiolipin IgA: <9 APL U/mL (ref 0–11)
Anticardiolipin IgG: <9 GPL U/mL (ref 0–14)
Anticardiolipin IgM: <9 MPL U/mL (ref 0–12)

## 2021-02-09 LAB — BETA-2-GLYCOPROTEIN I ABS, IGG/M/A
Beta-2 Glyco I IgG: <9 GPI IgG units (ref 0–20)
Beta-2-Glycoprotein I IgA: 97 GPI IgA units — ABNORMAL HIGH (ref 0–25)
Beta-2-Glycoprotein I IgM: >150 GPI IgM units — ABNORMAL HIGH (ref 0–32)

## 2021-02-09 LAB — PROTIME-INR
INR: 1.8 — ABNORMAL HIGH (ref 0.8–1.2)
Prothrombin Time: 20.6 seconds — ABNORMAL HIGH (ref 11.4–15.2)

## 2021-02-09 LAB — SARS CORONAVIRUS 2 (TAT 6-24 HRS): SARS Coronavirus 2: NEGATIVE

## 2021-02-09 NOTE — Progress Notes (Addendum)
   VASCULAR SURGERY ASSESSMENT & PLAN:   3 Days Post-Op S/P MECHANICAL THROMBECTOMY IVC LEFT ILIAC VEIN/LEFT COMMON ILIAC VEIN STENT: The patient is doing well status post mechanical thrombectomy with left common iliac external iliac common femoral superficial femoral popliteal vein.  VSS. Afebrile. There is no significant hematoma in the left popliteal space   Thigh-high compression stockings with a gradient of 20 to 30 mmHg in place.  Leg elevation ordered.   Converted from heparin to Kiefer. OK to discharge from vascular surgery perspective.  Follow-up 4-6 weeks with Dr. Oneida Alar with IVC/iliac vein duplex.   SUBJECTIVE:   She denies pain this morning. She says she ambulated in the hallway yesterday.  PHYSICAL EXAM:   Vitals:   02/08/21 0335 02/08/21 0729 02/08/21 1636 02/08/21 1942  BP: 120/64 132/62 (!) 157/81 (!) 153/78  Pulse: 78 82 83 93  Resp: 16 18 17 18   Temp: 98.3 F (36.8 C) 98.3 F (36.8 C) 98 F (36.7 C) 98.2 F (36.8 C)  TempSrc: Oral Oral Oral Oral  SpO2: 100% 100% 100% 98%  Weight:      Height:       General appearance: Awake, alert in no apparent distress Cardiac: Heart rate and rhythm are regular Respirations: Nonlabored Extremities: Thigh high compression in place. Mild LLE edema. Both feet are warm with intact sensation and motor function.  Popliteal fossa and calf compartments are soft. 2+ left DP pulse.  LABS:   Lab Results  Component Value Date   WBC 9.8 02/08/2021   HGB 9.1 (L) 02/08/2021   HCT 28.0 (L) 02/08/2021   MCV 90.9 02/08/2021   PLT 146 (L) 02/08/2021   Lab Results  Component Value Date   CREATININE 0.59 02/07/2021   Lab Results  Component Value Date   INR 2.0 (H) 02/08/2021   CBG (last 3)  Recent Labs    02/08/21 2130 02/08/21 2313 02/09/21 0400  GLUCAP 296* 265* 188*    PROBLEM LIST:    Principal Problem:   Acute DVT (deep venous thrombosis) (HCC) Active Problems:   Hypertension   Type 2 diabetes mellitus with  diabetic neuropathy, unspecified (HCC)   SBO (small bowel obstruction) (HCC)   Rheumatoid arthritis (HCC)   Fever   CURRENT MEDS:   . amLODipine  5 mg Oral Daily  . apixaban  10 mg Oral BID   Followed by  . [START ON 02/14/2021] apixaban  5 mg Oral BID  . bisacodyl  10 mg Rectal Once  . Chlorhexidine Gluconate Cloth  6 each Topical Daily  . DULoxetine  30 mg Oral Daily  . folic acid  1 mg Oral Daily  . gabapentin  300 mg Oral BID  . insulin aspart  0-9 Units Subcutaneous Q4H  . methotrexate  20 mg Oral Q Sun  . predniSONE  40 mg Oral Q breakfast  . rosuvastatin  10 mg Oral Daily  . sodium chloride flush  3 mL Intravenous Q12H    Barbie Banner, [place] Office: [phone] 02/09/2021   I have interviewed the patient and examined the patient. I agree with the findings by the PA. [place] for D/C from our standpoint on Eliquis.   Gae Gallop, MD [phone]

## 2021-02-09 NOTE — Progress Notes (Signed)
PROGRESS NOTE    Diane [name]  XNA:[id number] DOB: Apr 21, 1946 DOA: 02/04/2021 PCP: [name], MD   Brief Narrative: 75 year old with past medical history significant for hypertension, noninsulin-dependent type 2 diabetes, rheumatoid arthritis, anxiety, depression, mild cognitive deficit, recent hospitalization from 3/24 2022 on 12/02/2020 for partial SBO which was treated nonoperatively presented to the ED complaining of left leg pain and swelling.  Evaluation in the ED Doppler of ER DVT morphine left common femoral vein, saphenous junction, left femoral vein, left proximal profunda vein, left popliteal vein, left posterior tibial veins and left peroneal veins.  Patient was a started on IV heparin and vascular surgery was consulted, s/p thrombectomy and thrombolysis.  Heparin was transition to Eliquis today.  Thigh length compression stockings and elevation of lower extremity was ordered by vascular surgery. PT is recommending SNF placement.  Medically stable for discharge.  Assessment & Plan:   Principal Problem:   Acute DVT (deep venous thrombosis) (HCC) Active Problems:   Hypertension   Type 2 diabetes mellitus with diabetic neuropathy, unspecified (HCC)   SBO (small bowel obstruction) (HCC)   Rheumatoid arthritis (HCC)   Fever   1-Acute extensive DVT of left lower extremity: S/p thrombectomy Doppler: DVT involving left common femoral vein, SF junction, left femoral vein, left proximal profunda vein, left popliteal vein, left posterior tibial veins, left peroneal veins. -Risk factors: Obesity, immobilization recent hospitalization.   -Switch heparin with Eliquis. -Vascular might put IVC filter, she will follow-up with them as an outpatient. -Vascular recommending thigh-high of her compression stockings and limb elevation. -PT is recommending SNF placement. -TOC consult for SNF  2-Hypertension: Continue with amlodipine and as needed hydralazine  Noninsulin-dependent  diabetes type 2, with diabetic neuropathy: Hemoglobin A1c 7.3 on 01/22/2021 Continue with sliding scale insulin  Fever; remained afebrile over the past 48-hour.  Incentive spirometry.  Chest x ray  was negative for infection.  Could be related to DVT>   Abdominal pain, Constipation:  Report pain maybe started 2 days ago. She ignore pain.  No BM since Sunday. She is passing gas.  KUB with mild stool burden and no abnormal bowel dilatation. -Continue with bowel regimen  Rheumatoid arthritis: Continue with methotrexate, Plaquenil was discontinued by her provider recently.  Anxiety, depression: Continue with Cymbalta  Hyperlipidemia: Continue with Crestor  Recent partial SBO: Recently admitted for partial SBO etiology thought to be due to stricture versus inflammation.  She was a started on prednisone 40 mg daily and plan is to follow-up with Dr. Collene Mares who will determine further  taper.   Estimated body mass index is 32.98 kg/m as calculated from the following:   Height as of this encounter: 5\' 8"  (1.727 m).   Weight as of this encounter: 98.4 kg.   DVT prophylaxis: Eliquis Code Status: Full code Family Communication: Daughter was updated at bedside Disposition Plan:  Status is: Inpatient  Remains inpatient appropriate because:IV treatments appropriate due to intensity of illness or inability to take PO   Dispo: The patient is from: Home              Anticipated d/c is to: SNF              Patient currently is medically stable.   Difficult to place patient No   Consultants:   Vascular  Procedures:  Doppler  Antimicrobials:   Subjective: Patient was sitting comfortably in chair when seen today.  No new complaint.  Daughter at bedside.  Objective: Vitals:   02/08/21 1636  02/08/21 1942 02/09/21 0837 02/09/21 1129  BP: (!) 157/81 (!) 153/78 140/75 (!) 161/71  Pulse: 83 93 79 82  Resp: 17 18 (!) 22 18  Temp: 98 F (36.7 C) 98.2 F (36.8 C) 98.3 F (36.8 C) 97.9  F (36.6 C)  TempSrc: Oral Oral Axillary Oral  SpO2: 100% 98% 98% 100%  Weight:      Height:        Intake/Output Summary (Last 24 hours) at 02/09/2021 1644 Last data filed at 02/08/2021 2314 Gross per 24 hour  Intake --  Output 400 ml  Net -400 ml   Filed Weights   02/04/21 1800  Weight: 98.4 kg    Examination:  General.  Well-developed elderly lady, in no acute distress. Pulmonary.  Lungs clear bilaterally, normal respiratory effort. CV.  Regular rate and rhythm, no JVD, rub or murmur. Abdomen.  Soft, nontender, nondistended, BS positive. CNS.  Alert and oriented x3.  No focal neurologic deficit. Extremities.  No edema, no cyanosis, pulses intact and symmetrical.  Thigh-high compression stockings in place. Psychiatry.  Judgment and insight appears normal.   Data Reviewed: I have personally reviewed following labs and imaging studies  CBC: Recent Labs  Lab 02/06/21 0535 02/06/21 1917 02/07/21 0828 02/08/21 0400 02/09/21 0628  WBC 11.9* 17.1* 11.4* 9.8 8.2  HGB 12.0 12.2 9.9* 9.1* 9.5*  HCT 36.7 37.1 30.8* 28.0* 28.7*  MCV 90.0 90.3 90.3 90.9 90.0  PLT 178 169 141* 146* 161   Basic Metabolic Panel: Recent Labs  Lab 02/04/21 2044 02/05/21 2032 02/06/21 0535 02/07/21 0828  NA 138 138 138 139  K 3.7 5.1 3.6 3.5  CL 108 108 107 110  CO2 21* 21* 20* 25  GLUCOSE 131* 214* 174* 138*  BUN 8 10 8  7*  CREATININE 0.52 0.72 0.69 0.59  CALCIUM 8.6* 8.6* 8.5* 7.8*   GFR: Estimated Creatinine Clearance: 75.7 mL/min (by C-G formula based on SCr of 0.59 mg/dL). Liver Function Tests: No results for input(s): AST, ALT, ALKPHOS, BILITOT, PROT, ALBUMIN in the last 168 hours. No results for input(s): LIPASE, AMYLASE in the last 168 hours. No results for input(s): AMMONIA in the last 168 hours. Coagulation Profile: Recent Labs  Lab 02/05/21 0248 02/06/21 0535 02/07/21 0828 02/08/21 0400 02/09/21 0628  INR 1.2 1.3* 1.6* 2.0* 1.8*   Cardiac Enzymes: No results for  input(s): CKTOTAL, CKMB, CKMBINDEX, TROPONINI in the last 168 hours. BNP (last 3 results) No results for input(s): PROBNP in the last 8760 hours. HbA1C: No results for input(s): HGBA1C in the last 72 hours. CBG: Recent Labs  Lab 02/08/21 2313 02/09/21 0400 02/09/21 0832 02/09/21 1127 02/09/21 1612  GLUCAP 265* 188* 117* 240* 237*   Lipid Profile: No results for input(s): CHOL, HDL, LDLCALC, TRIG, CHOLHDL, LDLDIRECT in the last 72 hours. Thyroid Function Tests: No results for input(s): TSH, T4TOTAL, FREET4, T3FREE, THYROIDAB in the last 72 hours. Anemia Panel: No results for input(s): VITAMINB12, FOLATE, FERRITIN, TIBC, IRON, RETICCTPCT in the last 72 hours. Sepsis Labs: No results for input(s): PROCALCITON, LATICACIDVEN in the last 168 hours.  Recent Results (from the past 240 hour(s))  SARS CORONAVIRUS 2 (TAT 6-24 HRS) Nasopharyngeal Nasopharyngeal Swab     Status: None   Collection Time: 02/04/21  6:03 PM   Specimen: Nasopharyngeal Swab  Result Value Ref Range Status   SARS Coronavirus 2 NEGATIVE NEGATIVE Final    Comment: (NOTE) SARS-CoV-2 target nucleic acids are NOT DETECTED.  The SARS-CoV-2 RNA is generally detectable in upper  and lower respiratory specimens during the acute phase of infection. Negative results do not preclude SARS-CoV-2 infection, do not rule out co-infections with other pathogens, and should not be used as the sole basis for treatment or other patient management decisions. Negative results must be combined with clinical observations, patient history, and epidemiological information. The expected result is Negative.  Fact Sheet for Patients: SugarRoll.be  Fact Sheet for Healthcare Providers: https://www.woods-mathews.com/  This test is not yet approved or cleared by the [place] FDA and  has been authorized for detection and/or diagnosis of SARS-CoV-2 by FDA under an Emergency Use Authorization  (EUA). This EUA will remain  in effect (meaning this test can be used) for the duration of the COVID-19 declaration under Se ction 564(b)(1) of the Act, 21 U.S.C. section 360bbb-3(b)(1), unless the authorization is terminated or revoked sooner.  Performed at [facility], [place] [address] [place], [place] [address]   Culture, blood (routine x 2)     Status: None (Preliminary result)   Collection Time: 02/06/21  8:20 AM   Specimen: BLOOD LEFT HAND  Result Value Ref Range Status   Specimen Description BLOOD LEFT HAND  Final   Special Requests   Final    BOTTLES DRAWN AEROBIC ONLY Blood Culture results may not be optimal due to an inadequate volume of blood received in culture bottles   Culture   Final    NO GROWTH 3 DAYS Performed at [facility], [place] [address], [place], [place] [address]    Report Status PENDING  Incomplete  Culture, blood (routine x 2)     Status: None (Preliminary result)   Collection Time: 02/06/21  8:20 AM   Specimen: BLOOD RIGHT HAND  Result Value Ref Range Status   Specimen Description BLOOD RIGHT HAND  Final   Special Requests   Final    BOTTLES DRAWN AEROBIC AND ANAEROBIC Blood Culture adequate volume   Culture   Final    NO GROWTH 3 DAYS Performed at [facility], [place] [address] [place], [place] [address]    Report Status PENDING  Incomplete  SARS CORONAVIRUS 2 (TAT 6-24 HRS) Nasopharyngeal Nasopharyngeal Swab     Status: None   Collection Time: 02/09/21 11:48 AM   Specimen: Nasopharyngeal Swab  Result Value Ref Range Status   SARS Coronavirus 2 NEGATIVE NEGATIVE Final    Comment: (NOTE) SARS-CoV-2 target nucleic acids are NOT DETECTED.  The SARS-CoV-2 RNA is generally detectable in upper and lower respiratory specimens during the acute phase of infection. Negative results do not preclude SARS-CoV-2 infection, do not rule out co-infections with other pathogens, and should not be used as the sole basis for treatment or other  patient management decisions. Negative results must be combined with clinical observations, patient history, and epidemiological information. The expected result is Negative.  Fact Sheet for Patients: SugarRoll.be  Fact Sheet for Healthcare Providers: https://www.woods-mathews.com/  This test is not yet approved or cleared by the [place] FDA and  has been authorized for detection and/or diagnosis of SARS-CoV-2 by FDA under an Emergency Use Authorization (EUA). This EUA will remain  in effect (meaning this test can be used) for the duration of the COVID-19 declaration under Se ction 564(b)(1) of the Act, 21 U.S.C. section 360bbb-3(b)(1), unless the authorization is terminated or revoked sooner.  Performed at [facility], [place] [address] [place], [place] [address]     Radiology Studies: No results found. Scheduled Meds: . amLODipine  5 mg Oral Daily  . apixaban  10 mg Oral BID   Followed by  . [START ON 02/14/2021] apixaban  5 mg Oral BID  . bisacodyl  10 mg Rectal Once  . Chlorhexidine Gluconate Cloth  6 each Topical Daily  . DULoxetine  30 mg Oral Daily  . folic acid  1 mg Oral Daily  . gabapentin  300 mg Oral BID  . insulin aspart  0-9 Units Subcutaneous Q4H  . methotrexate  20 mg Oral Q Sun  . predniSONE  40 mg Oral Q breakfast  . rosuvastatin  10 mg Oral Daily  . sodium chloride flush  3 mL Intravenous Q12H   Continuous Infusions: . sodium chloride 100 mL/hr at 02/06/21 0600  . sodium chloride       LOS: 5 days    Time spent: 25 minutes.   This record has been created using Systems analyst. Errors have been sought and corrected,but may not always be located. Such creation errors do not reflect on the standard of care.  Lorella Nimrod, MD [facility]   If 7PM-7AM, please contact night-coverage [email or web address]  02/09/2021, 4:44 PM

## 2021-02-09 NOTE — Evaluation (Signed)
Occupational Therapy Evaluation Patient Details Name: Diane [name] MRN: [id number] DOB: November 01, 1946 Today's Date: 02/09/2021    History of Present Illness 75 y.o. female presents to [facility] ED on 02/04/2021 with LLE swelling and pain. Pt recently discharged from hospital for SBO on 4/1. Pt found to have LLE DVT. Pt underwent LLE venous thrombectomy on 4/8 with stent placement. PMH significant for hypertension, noninsulin-dependent type 2 diabetes, rheumatoid arthritis, anxiety, depression, mild cognitive deficit.   Clinical Impression   Pt is typically mod I for mobility and gets assist from children PRN for LB bathing/dressing. Today Pt is min A for transfers with vc for safe hand placement. Leans against sink during standing grooming tasks, mod A for LB ADL (can assist from knees up. Min A to return to bed for assist with LLE. Pt very pleasant and motivated will benefit from skilled OT in the acute setting as well as afterwards at the SNF level to maximize safety and independence in ADL and functional transfers. Next session plan on AE for LB ADL.     Follow Up Recommendations  SNF    Equipment Recommendations  Other (comment) (defer to next venue of care)    Recommendations for Other Services       Precautions / Restrictions Precautions Precautions: Fall Precaution Comments: elevate LLE Restrictions Weight Bearing Restrictions: No      Mobility Bed Mobility Overal bed mobility: Needs Assistance Bed Mobility: Sit to Supine       Sit to supine: Min assist   General bed mobility comments: assist for LLE back into bed    Transfers Overall transfer level: Needs assistance Equipment used: Rolling walker (2 wheeled) Transfers: Sit to/from Stand Sit to Stand: Min assist         General transfer comment: vc for safe hand placement and biomechanical positioning    Balance Overall balance assessment: Needs assistance Sitting-balance support: No upper extremity supported;Feet  supported Sitting balance-Leahy Scale: Good     Standing balance support: Single extremity supported;Bilateral upper extremity supported Standing balance-Leahy Scale: Poor Standing balance comment: reliant on UE support of RW and leans against sink                           ADL either performed or assessed with clinical judgement   ADL Overall ADL's : Needs assistance/impaired Eating/Feeding: Modified independent     Grooming Details (indicate cue type and reason): leans against sink in RW for balance Upper Body Bathing: Min guard;Sitting   Lower Body Bathing: Moderate assistance;Sitting/lateral leans   Upper Body Dressing : Set up;Sitting   Lower Body Dressing: Moderate assistance;Sitting/lateral leans Lower Body Dressing Details (indicate cue type and reason): especially assist for LLE Toilet Transfer: Minimal assistance;Ambulation;RW Toilet Transfer Details (indicate cue type and reason): min A to boost into standing and then min guard for ambulation to toilet Toileting- Clothing Manipulation and Hygiene: Minimal assistance;Sit to/from stand       Functional mobility during ADLs: Minimal assistance;Rolling walker (min A to power into standing, then min guard) General ADL Comments: decreased activity tolerance, decreased access to LB for ADL     Vision Patient Visual Report: No change from baseline       Perception     Praxis      Pertinent Vitals/Pain Pain Assessment: No/denies pain Pain Intervention(s): Monitored during session     Hand Dominance Right   Extremity/Trunk Assessment Upper Extremity Assessment Upper Extremity Assessment: Generalized weakness  Lower Extremity Assessment Lower Extremity Assessment: Defer to PT evaluation   Cervical / Trunk Assessment Cervical / Trunk Assessment: Normal   Communication Communication Communication: No difficulties   Cognition Arousal/Alertness: Awake/alert Behavior During Therapy: WFL for tasks  assessed/performed Overall Cognitive Status: Within Functional Limits for tasks assessed                                     General Comments  VSS on RA    Exercises     Shoulder Instructions      Home Living Family/patient expects to be discharged to:: Private residence Living Arrangements: Alone Available Help at Discharge: Family;Available PRN/intermittently (children work during the day) Type of Home: House Home Access: Level entry     Home Layout: Two level Alternate Level Stairs-Number of Steps: flight Alternate Level Stairs-Rails: Right;Left Bathroom Shower/Tub: Tub/shower unit;Curtain   Bathroom Toilet: Standard Bathroom Accessibility: Yes How Accessible: Accessible via walker Home Equipment: Hand held shower head;Grab bars - tub/shower;Walker - 4 wheels          Prior Functioning/Environment Level of Independence: Needs assistance  Gait / Transfers Assistance Needed: Independent with ambulation ADL's / Homemaking Assistance Needed: Reports daughter assists with bathing and dressing as needed.            OT Problem List: Decreased range of motion;Impaired balance (sitting and/or standing);Pain;Decreased activity tolerance;Decreased knowledge of use of DME or AE;Impaired UE functional use      OT Treatment/Interventions: Self-care/ADL training;DME and/or AE instruction;Patient/family education;Balance training    OT Goals(Current goals can be found in the care plan section) Acute Rehab OT Goals Patient Stated Goal: To improve strength and mobility while reducing falls risk OT Goal Formulation: With patient Time For Goal Achievement: 02/23/21 Potential to Achieve Goals: Good ADL Goals Pt Will Perform Grooming: with modified independence;standing Pt Will Perform Lower Body Bathing: with set-up;sit to/from stand Pt Will Perform Lower Body Dressing: with min guard assist;with caregiver independent in assisting;sit to/from stand Pt Will  Transfer to Toilet: with modified independence;ambulating Pt Will Perform Toileting - Clothing Manipulation and hygiene: with modified independence;sit to/from stand  OT Frequency: Min 2X/week   Barriers to D/C: Decreased caregiver support  children work during the day       Co-evaluation              AM-PAC OT "6 Clicks" Daily Activity     Outcome Measure Help from another person eating meals?: None Help from another person taking care of personal grooming?: A Little Help from another person toileting, which includes using toliet, bedpan, or urinal?: A Little Help from another person bathing (including washing, rinsing, drying)?: A Lot Help from another person to put on and taking off regular upper body clothing?: A Little Help from another person to put on and taking off regular lower body clothing?: A Lot 6 Click Score: 17   End of Session    Activity Tolerance:   Patient left:    OT Visit Diagnosis: Unsteadiness on feet (R26.81);Muscle weakness (generalized) (M62.81);Other abnormalities of gait and mobility (R26.89);Pain Pain - Right/Left: Left Pain - part of body: Leg                Time: 1914-7829 OT Time Calculation (min): 18 min Charges:  OT General Charges $OT Visit: 1 Visit OT Evaluation $OT Eval Moderate Complexity: [place] OTR/L Acute Rehabilitation Services Pager: [phone] Office: [phone]  Merri Ray Abrea Henle 02/09/2021, 12:48 PM

## 2021-02-09 NOTE — Progress Notes (Signed)
Mobility Specialist: Progress Note   02/09/21 1501  Mobility  Activity Ambulated in hall  Level of Assistance Minimal assist, patient does 75% or more  Assistive Device Front wheel walker  Distance Ambulated (ft) 120 ft  Mobility Response Tolerated well  Mobility performed by Mobility specialist  Bed Position Chair  $Mobility charge 1 Mobility   Pre-Mobility: 75 HR Post-Mobility: 74 HR, 158/82 BP, 99% SpO2  Pt c/o 5-6/10 pain during ambulation, pt otherwise asx. Pt to chair after walk per request.   Diane [name] Diane [name] Mobility Specialist Mobility Specialist Phone: [phone]

## 2021-02-09 NOTE — TOC Progression Note (Signed)
Transition of Care [facility]) - Progression Note    Patient Details  Name: Diane [name] MRN: [id number] Date of Birth: [birth year]  Transition of Care [facility]) CM/SW [place], [place] Phone Number: 02/09/2021, 12:18 PM  Clinical Narrative:     CSW met with patient and her daughter,Tonia. CSW introduced self and explained role. Patient remains agreeable to SNF placement. CSW informed [place]- reviewing, [facility]-no bed available, [facility]-insurance not in network. Patient and family is agreeable to [facility].   CSW called [facility], confirmed bed offer and availability. Informed on anticipated d/c tomorrow. CSW received [employer] # [id number], From 04/12-04/14. RN updated and covid test requested- informed RN- patient inquired about receiving booster shot.   CSW will continue to follow and assist with discharge planning.   Thurmond Butts, MSW, LCSW Clinical Social Worker    Expected Discharge Plan: Skilled Nursing Facility Barriers to Discharge: SNF Pending bed offer,Continued Medical Work up,Insurance Authorization  Expected Discharge Plan and Services Expected Discharge Plan: [place] arrangements for the past 2 months: Single Family Home                                       Social Determinants of Health (SDOH) Interventions    Readmission Risk Interventions No flowsheet data found.

## 2021-02-09 NOTE — Progress Notes (Signed)
Physical Therapy Treatment Patient Details Name: Diane [name] MRN: [id number] DOB: 12-15-1945 Today's Date: 02/09/2021    History of Present Illness 75 y.o. female presents to [facility] ED on 02/04/2021 with LLE swelling and pain. Pt recently discharged from hospital for SBO on 4/1. Pt found to have LLE DVT. Pt underwent LLE venous thrombectomy on 4/8 with stent placement. PMH significant for hypertension, noninsulin-dependent type 2 diabetes, rheumatoid arthritis, anxiety, depression, mild cognitive deficit.    PT Comments    Pt progressing well with PT intervention, demonstrating increased ambulation tolerance. Pt continues to demonstrate LE weakness and requires physical assistance to power up into standing. Pt's daughter present and is in agreement with SNF recommendation, reports top choice of [employer]. Pt will continue to benefit from aggressive mobilization and acute PT services to improve LE strength and reduce falls risk.   Follow Up Recommendations  SNF     Equipment Recommendations  Rolling walker with 5" wheels    Recommendations for Other Services       Precautions / Restrictions Precautions Precautions: Fall Precaution Comments: elevate LLE Restrictions Weight Bearing Restrictions: No    Mobility  Bed Mobility                    Transfers Overall transfer level: Needs assistance Equipment used: Rolling walker (2 wheeled) Transfers: Sit to/from Stand Sit to Stand: Min assist         General transfer comment: verbal cues for anterior trunk lean and hand placement. Physical assistance to power up into standing  Ambulation/Gait Ambulation/Gait assistance: Min guard Gait Distance (Feet): 120 Feet Assistive device: Rolling walker (2 wheeled) Gait Pattern/deviations: Step-to pattern Gait velocity: reduced Gait velocity interpretation: <1.31 ft/sec, indicative of household ambulator General Gait Details: pt with slowed step-to gait, reduced stance time on  LLE resulting in shortened RLE step length   Stairs             Wheelchair Mobility    Modified Rankin (Stroke Patients Only)       Balance Overall balance assessment: Needs assistance Sitting-balance support: No upper extremity supported;Feet supported Sitting balance-Leahy Scale: Good     Standing balance support: Single extremity supported;Bilateral upper extremity supported Standing balance-Leahy Scale: Poor Standing balance comment: reliant on UE support of RW                            Cognition Arousal/Alertness: Awake/alert Behavior During Therapy: WFL for tasks assessed/performed Overall Cognitive Status: Within Functional Limits for tasks assessed                                        Exercises      General Comments General comments (skin integrity, edema, etc.): VSS on RA      Pertinent Vitals/Pain Pain Assessment: No/denies pain    Home Living                      Prior Function            PT Goals (current goals can now be found in the care plan section) Acute Rehab PT Goals Patient Stated Goal: To improve strength and mobility while reducing falls risk Progress towards PT goals: Progressing toward goals    Frequency    Min 2X/week      PT Plan Current plan  remains appropriate    Co-evaluation              AM-PAC PT "6 Clicks" Mobility   Outcome Measure  Help needed turning from your back to your side while in a flat bed without using bedrails?: A Little Help needed moving from lying on your back to sitting on the side of a flat bed without using bedrails?: A Lot Help needed moving to and from a bed to a chair (including a wheelchair)?: A Little Help needed standing up from a chair using your arms (e.g., wheelchair or bedside chair)?: A Little Help needed to walk in hospital room?: A Little Help needed climbing 3-5 steps with a railing? : A Lot 6 Click Score: 16    End of Session    Activity Tolerance: Patient tolerated treatment well Patient left: in chair;with call bell/phone within reach;with chair alarm set;with family/visitor present Nurse Communication: Mobility status PT Visit Diagnosis: Unsteadiness on feet (R26.81);Other abnormalities of gait and mobility (R26.89);Muscle weakness (generalized) (M62.81);Pain     Time: 6979-4801 PT Time Calculation (min) (ACUTE ONLY): 20 min  Charges:  $Gait Training: 8-22 mins                     Zenaida Niece, PT, DPT Acute Rehabilitation Pager: [phone]    Zenaida Niece 02/09/2021, 10:24 AM

## 2021-02-09 NOTE — Discharge Instructions (Signed)
Deep Vein Thrombosis  Deep vein thrombosis (DVT) is a condition in which a blood clot forms in a deep vein, such as a vein in the lower leg, thigh, pelvis, or arm. Deep veins are veins in the deep venous system. A clot is blood that has thickened into a gel or solid. This condition is serious and can be life-threatening if the clot travels to the lungs and causes a blockage (pulmonary embolism) in the arteries of the lung. A DVT can also damage veins in the leg. This can lead to long-term, or chronic, venous disease, leg pain, swelling, discoloration, and ulcers or sores (post-thrombotic syndrome). What are the causes? This condition may be caused by:  A slowdown of blood flow.  Damage to a vein.  A condition that causes blood to clot more easily, such as certain blood-clotting disorders. What increases the risk? The following factors may make you more likely to develop this condition:  Having obesity.  Being older, especially older than age 60.  Being inactive (sedentary lifestyle) or not moving around. This may include: ? Sitting or lying down for longer than 4-6 hours other than to sleep at night. ? Being in the hospital, having major or lengthy surgery, or having a thin, flexible tube (central line catheter) placed in a large vein.  Being pregnant, giving birth, or having recently given birth.  Taking medicines that contain estrogen, such as birth control or hormone replacement therapy.  Using products that contain nicotine or tobacco, especially if you use hormonal birth control.  Having a history of blood clots or a blood-clotting disease, a blood vessel disease (peripheral vascular disease), or congestive heart disease.  Having a history of cancer, especially if being treated with chemotherapy. What are the signs or symptoms? Symptoms of this condition include:  Swelling, pain, pressure, or tenderness in an arm or a leg.  An arm or a leg becoming warm, red, or  discolored.  A leg turning very pale. You may have a large DVT. This is rare. If the clot is in your leg, you may notice symptoms more or have worse symptoms when you stand or walk. In some cases, there are no symptoms. How is this diagnosed? This condition is diagnosed with:  Your medical history and a physical exam.  Tests, such as: ? Blood tests to check how well your blood clots. ? Doppler ultrasound. This is the best way to find a DVT. ? Venogram. Contrast dye is injected into a vein, and X-rays are taken to check for clots. How is this treated? Treatment for this condition depends on:  The cause of your DVT.  The size and location of your DVT, or having more than one DVT.  Your risk for bleeding or developing more clots.  Other medical conditions you may have. Treatment may include:  Taking a blood thinner, also called an anticoagulant, to prevent clots from forming and growing.  Wearing compression stockings, if directed.  Injecting medicines into the affected vein to break up the clot (catheter-directed thrombolysis). This is used only for severe DVT and only if a specialist recommends it.  Specific surgical procedures, when DVT is severe or hard to treat. These may be done to: ? Isolate and remove your clot. ? Place an inferior vena cava (IVC) filter in a large vein to catch blood clots before they reach your lungs. You may get some medical treatments for 6 months or longer. Follow these instructions at home: If you are taking blood thinners:    Talk with your health care provider before you take any medicines that contain aspirin or NSAIDs, such as ibuprofen. These medicines increase your risk for dangerous bleeding.  Take your medicine exactly as told, at the same time every day. Do not skip a dose. Do not take more than the prescribed dose. This is important.  Ask your health care provider about foods and medicines that could change the way your blood thinner  works (may interact). Avoid these foods and medicines if you are told to do so.  Avoid anything that may cause bleeding or bruising. You may bleed more easily while taking blood thinners. ? Be very careful when using knives, scissors, or other sharp objects. ? Use an electric razor instead of a blade. ? Avoid activities that could cause injury or bruising, and follow instructions for preventing falls. ? Tell your health care provider if you have had any internal bleeding, bleeding ulcers, or neurologic diseases, such as strokes or cerebral aneurysms.  Wear a medical alert bracelet or carry a card that lists what medicines you take. General instructions  Take over-the-counter and prescription medicines only as told by your health care provider.  Return to your normal activities as told by your health care provider. Ask your health care provider what activities are safe for you.  If recommended, wear compression stockings as told by your health care provider. These stockings help to prevent blood clots and reduce swelling in your legs.  Keep all follow-up visits as told by your health care provider. This is important. Contact a health care provider if:  You miss a dose of your blood thinner.  You have new or worse pain, swelling, or redness in an arm or a leg.  You have worsening numbness or tingling in an arm or a leg.  You have unusual bruising. Get help right away if:  You have signs or symptoms that a blood clot has moved to the lungs. These may include: ? Shortness of breath. ? Chest pain. ? Fast or irregular heartbeats (palpitations). ? Light-headedness or dizziness. ? Coughing up blood.  You have signs or symptoms that your blood is too thin. These may include: ? Blood in your vomit, stool, or urine. ? A cut that will not stop bleeding. ? A menstrual period that is heavier than usual. ? A severe headache or confusion. These symptoms may represent a serious problem that  is an emergency. Do not wait to see if the symptoms will go away. Get medical help right away. Call your local emergency services (911 in the [place] Do not drive yourself to the hospital. Summary  Deep vein thrombosis (DVT) happens when a blood clot forms in a deep vein. This may occur in the lower leg, thigh, pelvis, or arm.  Symptoms affect the arm or leg and can include swelling, pain, tenderness, warmth, redness, or discoloration.  This condition may be treated with medicines or compression stockings. In severe cases, surgery may be done.  If you are taking blood thinners, take them exactly as told. Do not skip a dose. Do not take more than is prescribed.  Get help right away if you have shortness of breath, chest pain, fast or irregular heartbeats, or blood in your vomit, urine, or stool. This information is not intended to replace advice given to you by your health care provider. Make sure you discuss any questions you have with your health care provider. Document Revised: 10/13/2019 Document Reviewed: 10/13/2019 Elsevier Patient Education  2021 Elsevier  Inc. Information on my medicine - ELIQUIS (apixaban)  This medication education was reviewed with me or my healthcare representative as part of my discharge preparation.  The pharmacist that spoke with me during my hospital stay was:  Amedeo Plenty, [facility]  Why was Eliquis prescribed for you? Eliquis was prescribed for you to reduce the risk of forming blood clots that can cause a stroke if you have a medical condition called atrial fibrillation (a type of irregular heartbeat) OR to reduce the risk of a blood clots forming after orthopedic surgery.  What do You need to know about Eliquis ? Take your Eliquis TWICE DAILY - one tablet in the morning and one tablet in the evening with or without food.  It would be best to take the doses about the same time each day.  If you have difficulty swallowing the tablet whole please discuss  with your pharmacist how to take the medication safely.  Take Eliquis exactly as prescribed by your doctor and DO NOT stop taking Eliquis without talking to the doctor who prescribed the medication.  Stopping may increase your risk of developing a new clot or stroke.  Refill your prescription before you run out.  After discharge, you should have regular check-up appointments with your healthcare provider that is prescribing your Eliquis.  In the future your dose may need to be changed if your kidney function or weight changes by a significant amount or as you get older.  What do you do if you miss a dose? If you miss a dose, take it as soon as you remember on the same day and resume taking twice daily.  Do not take more than one dose of ELIQUIS at the same time.  Important Safety Information A possible side effect of Eliquis is bleeding. You should call your healthcare provider right away if you experience any of the following: ? Bleeding from an injury or your nose that does not stop. ? Unusual colored urine (red or dark brown) or unusual colored stools (red or black). ? Unusual bruising for unknown reasons. ? A serious fall or if you hit your head (even if there is no bleeding).  Some medicines may interact with Eliquis and might increase your risk of bleeding or clotting while on Eliquis. To help avoid this, consult your healthcare provider or pharmacist prior to using any new prescription or non-prescription medications, including herbals, vitamins, non-steroidal anti-inflammatory drugs (NSAIDs) and supplements.  This website has more information on Eliquis (apixaban): [email or web address].no.

## 2021-02-09 NOTE — Care Management Important Message (Signed)
Important Message  Patient Details  Name: Diane [name] MRN: [id number] Date of Birth: 04-03-1946   Medicare Important Message Given:  Yes     Shelda Altes 02/09/2021, 9:57 AM

## 2021-02-10 DIAGNOSIS — M6281 Muscle weakness (generalized): Secondary | ICD-10-CM | POA: Diagnosis not present

## 2021-02-10 DIAGNOSIS — G63 Polyneuropathy in diseases classified elsewhere: Secondary | ICD-10-CM | POA: Diagnosis not present

## 2021-02-10 DIAGNOSIS — R5381 Other malaise: Secondary | ICD-10-CM | POA: Diagnosis not present

## 2021-02-10 DIAGNOSIS — D649 Anemia, unspecified: Secondary | ICD-10-CM | POA: Diagnosis not present

## 2021-02-10 DIAGNOSIS — M0579 Rheumatoid arthritis with rheumatoid factor of multiple sites without organ or systems involvement: Secondary | ICD-10-CM | POA: Diagnosis not present

## 2021-02-10 DIAGNOSIS — I10 Essential (primary) hypertension: Secondary | ICD-10-CM

## 2021-02-10 DIAGNOSIS — R262 Difficulty in walking, not elsewhere classified: Secondary | ICD-10-CM | POA: Diagnosis not present

## 2021-02-10 DIAGNOSIS — K5900 Constipation, unspecified: Secondary | ICD-10-CM | POA: Diagnosis not present

## 2021-02-10 DIAGNOSIS — M069 Rheumatoid arthritis, unspecified: Secondary | ICD-10-CM | POA: Diagnosis not present

## 2021-02-10 DIAGNOSIS — M7989 Other specified soft tissue disorders: Secondary | ICD-10-CM | POA: Diagnosis not present

## 2021-02-10 DIAGNOSIS — M349 Systemic sclerosis, unspecified: Secondary | ICD-10-CM | POA: Diagnosis not present

## 2021-02-10 DIAGNOSIS — K56609 Unspecified intestinal obstruction, unspecified as to partial versus complete obstruction: Secondary | ICD-10-CM | POA: Diagnosis not present

## 2021-02-10 DIAGNOSIS — M05741 Rheumatoid arthritis with rheumatoid factor of right hand without organ or systems involvement: Secondary | ICD-10-CM | POA: Diagnosis not present

## 2021-02-10 DIAGNOSIS — Z7401 Bed confinement status: Secondary | ICD-10-CM | POA: Diagnosis not present

## 2021-02-10 DIAGNOSIS — L80 Vitiligo: Secondary | ICD-10-CM | POA: Diagnosis not present

## 2021-02-10 DIAGNOSIS — E1140 Type 2 diabetes mellitus with diabetic neuropathy, unspecified: Secondary | ICD-10-CM | POA: Diagnosis not present

## 2021-02-10 DIAGNOSIS — I82402 Acute embolism and thrombosis of unspecified deep veins of left lower extremity: Secondary | ICD-10-CM | POA: Diagnosis not present

## 2021-02-10 DIAGNOSIS — L03221 Cellulitis of neck: Secondary | ICD-10-CM | POA: Diagnosis not present

## 2021-02-10 DIAGNOSIS — M79643 Pain in unspecified hand: Secondary | ICD-10-CM | POA: Diagnosis not present

## 2021-02-10 DIAGNOSIS — R252 Cramp and spasm: Secondary | ICD-10-CM | POA: Diagnosis not present

## 2021-02-10 DIAGNOSIS — J069 Acute upper respiratory infection, unspecified: Secondary | ICD-10-CM | POA: Diagnosis not present

## 2021-02-10 DIAGNOSIS — Z79899 Other long term (current) drug therapy: Secondary | ICD-10-CM | POA: Diagnosis not present

## 2021-02-10 DIAGNOSIS — R2681 Unsteadiness on feet: Secondary | ICD-10-CM | POA: Diagnosis not present

## 2021-02-10 DIAGNOSIS — I82412 Acute embolism and thrombosis of left femoral vein: Secondary | ICD-10-CM | POA: Diagnosis not present

## 2021-02-10 DIAGNOSIS — I82442 Acute embolism and thrombosis of left tibial vein: Secondary | ICD-10-CM | POA: Diagnosis not present

## 2021-02-10 DIAGNOSIS — Z743 Need for continuous supervision: Secondary | ICD-10-CM | POA: Diagnosis not present

## 2021-02-10 DIAGNOSIS — K760 Fatty (change of) liver, not elsewhere classified: Secondary | ICD-10-CM | POA: Diagnosis not present

## 2021-02-10 DIAGNOSIS — Z96611 Presence of right artificial shoulder joint: Secondary | ICD-10-CM | POA: Diagnosis not present

## 2021-02-10 DIAGNOSIS — M2550 Pain in unspecified joint: Secondary | ICD-10-CM | POA: Diagnosis not present

## 2021-02-10 DIAGNOSIS — Z03818 Encounter for observation for suspected exposure to other biological agents ruled out: Secondary | ICD-10-CM | POA: Diagnosis not present

## 2021-02-10 DIAGNOSIS — J811 Chronic pulmonary edema: Secondary | ICD-10-CM | POA: Diagnosis not present

## 2021-02-10 DIAGNOSIS — M1990 Unspecified osteoarthritis, unspecified site: Secondary | ICD-10-CM | POA: Diagnosis not present

## 2021-02-10 DIAGNOSIS — M05742 Rheumatoid arthritis with rheumatoid factor of left hand without organ or systems involvement: Secondary | ICD-10-CM | POA: Diagnosis not present

## 2021-02-10 DIAGNOSIS — I82432 Acute embolism and thrombosis of left popliteal vein: Secondary | ICD-10-CM | POA: Diagnosis not present

## 2021-02-10 DIAGNOSIS — E785 Hyperlipidemia, unspecified: Secondary | ICD-10-CM | POA: Diagnosis not present

## 2021-02-10 LAB — GLUCOSE, CAPILLARY
Glucose-Capillary: 123 mg/dL — ABNORMAL HIGH (ref 70–99)
Glucose-Capillary: 135 mg/dL — ABNORMAL HIGH (ref 70–99)
Glucose-Capillary: 237 mg/dL — ABNORMAL HIGH (ref 70–99)

## 2021-02-10 LAB — CBC
HCT: 29.2 % — ABNORMAL LOW (ref 36.0–46.0)
Hemoglobin: 9.6 g/dL — ABNORMAL LOW (ref 12.0–15.0)
MCH: 29.6 pg (ref 26.0–34.0)
MCHC: 32.9 g/dL (ref 30.0–36.0)
MCV: 90.1 fL (ref 80.0–100.0)
Platelets: 168 10*3/uL (ref 150–400)
RBC: 3.24 MIL/uL — ABNORMAL LOW (ref 3.87–5.11)
RDW: 15.2 % (ref 11.5–15.5)
WBC: 7.6 10*3/uL (ref 4.0–10.5)
nRBC: 0 % (ref 0.0–0.2)

## 2021-02-10 LAB — PROTIME-INR
INR: 1.8 — ABNORMAL HIGH (ref 0.8–1.2)
Prothrombin Time: 20.3 seconds — ABNORMAL HIGH (ref 11.4–15.2)

## 2021-02-10 MED ORDER — APIXABAN 5 MG PO TABS: 10.0000 mg | ORAL_TABLET | Freq: Two times a day (BID) | ORAL | 3 refills | Status: DC

## 2021-02-10 MED ORDER — ROSUVASTATIN CALCIUM 10 MG PO TABS: 10.0000 mg | ORAL_TABLET | Freq: Every day | ORAL | 0 refills | Status: DC

## 2021-02-10 NOTE — Discharge Summary (Addendum)
Physician Discharge Summary  Diane [name] ZOX:[id number] DOB: Mar 11, 1946 DOA: 02/04/2021  PCP: [name], MD  Admit date: 02/04/2021 Discharge date: 02/10/2021  Admitted From: Home Disposition: SNF  Recommendations for Outpatient Follow-up:  1. Follow up with PCP in 1-2 weeks 2. Follow-up with Dr. Collene Mares 3. Follow-up with vascular surgery 4. Please obtain BMP/CBC in one week 5. Please follow up on the following pending results: None  Home Health: No Equipment/Devices: Rolling walker Discharge Condition:  CODE STATUS:  Diet recommendation: Heart Healthy / Carb Modified   Brief/Interim Summary: 75 year old with past medical history significant for hypertension, noninsulin-dependent type 2 diabetes, rheumatoid arthritis, anxiety, depression, mild cognitive deficit, recent hospitalization from 3/24 2022 on 12/02/2020 for partial SBO which was treated nonoperatively presented to the ED complaining of left leg pain and swelling.  Evaluation in the ED Doppler of ER DVT morphine left common femoral vein, saphenous junction, left femoral vein, left proximal profunda vein, left popliteal vein, left posterior tibial veins and left peroneal veins.  Patient was a started on IV heparin and vascular surgery was consulted, s/p thrombectomy and thrombolysis.  Heparin was transition to Eliquis.  Thigh length compression stockings and elevation of lower extremity was ordered by vascular surgery, patient should be maintained on thigh length compression stockings per vascular surgery recommendations and she will follow up with them in 2 weeks for further management and recommendations.  Patient has an history of recent admission for partial SBO thought to be due to stricture versus inflammation.  She was a started on prednisone 40 mg daily and plan is to follow-up with Dr. Collene Mares who will determine further  taper.  She needs to follow-up within 1 week. She did complain some abdominal pain during current  hospitalization, KUB was only positive for mild stool burden and no other abnormal bowel dilatation.  She was started on a bowel regimen and should continue to have daily soft BMs.  For blood pressure she will continue her home meds.  Has an history of rheumatoid arthritis and will continue home dose of methotrexate.  Patient was on Plaquenil before and according to her it was discontinued by her provider recently.  She will continue home dose of Cymbalta for anxiety and depression.  She will continue home dose of Crestor for dyslipidemia.  She will continue rest of her home meds and follow-up with her providers.  Discharge Diagnoses:  Principal Problem:   Acute DVT (deep venous thrombosis) (HCC) Active Problems:   Hypertension   Type 2 diabetes mellitus with diabetic neuropathy, unspecified (HCC)   SBO (small bowel obstruction) (HCC)   Rheumatoid arthritis (HCC)   Fever   Constipation   Discharge Instructions  Discharge Instructions    Diet - low sodium heart healthy   Complete by: As directed    Discharge instructions   Complete by: As directed    It was pleasure taking care of you. You are being started on a new blood thinner called Eliquis, you will take 2 tablets twice daily for next 6 days and then decrease the dose to 1 tablet twice daily. Follow-up with vascular surgery for further recommendations. We discontinued your home dose of aspirin to decrease the risk of bleeding. You are also been started on a cholesterol medication called Crestor-take it as directed. Follow-up with your doctors.   Increase activity slowly   Complete by: As directed    No wound care   Complete by: As directed      Allergies as of 02/10/2021  Reactions   Cephalexin Other (See Comments)   URINARY RETENTION = "blocked my urine"   Codeine Rash   Other reaction(s): hives   Nucynta [tapentadol Hcl] Other (See Comments)   Altered mental status      Medication List    STOP taking  these medications   aspirin 81 MG chewable tablet   methocarbamol 500 MG tablet Commonly known as: Robaxin   saccharomyces boulardii 250 MG capsule Commonly known as: Florastor     TAKE these medications   acetaminophen 500 MG tablet Commonly known as: TYLENOL Take 1 tablet (500 mg total) by mouth every 6 (six) hours as needed. What changed: reasons to take this   amLODipine 5 MG tablet Commonly known as: NORVASC Take 5 mg by mouth daily.   apixaban 5 MG Tabs tablet Commonly known as: ELIQUIS Take 2 tablets (10 mg total) by mouth 2 (two) times daily. For 3 more days, then take 1 tablet twice a day,starting from 02/14/21   clotrimazole-betamethasone cream Commonly known as: LOTRISONE Apply 1 application topically in the morning and at bedtime.   DULoxetine 30 MG capsule Commonly known as: CYMBALTA Take 30 mg by mouth daily.   folic acid 1 MG tablet Commonly known as: FOLVITE Take 1 mg by mouth daily.   gabapentin 300 MG capsule Commonly known as: NEURONTIN Take 300 mg by mouth 2 (two) times daily.   metFORMIN 500 MG tablet Commonly known as: GLUCOPHAGE Take 1,000 mg by mouth daily.   methotrexate 2.5 MG tablet Commonly known as: RHEUMATREX Take 20 mg by mouth once a week. 8 tablets weekly (Sunday) Notes to patient: Take 8 tablets every Sunday morning to equal total dose of 20 mg   nystatin cream Commonly known as: MYCOSTATIN Apply to affected area 2 times daily. groin What changed:   how much to take  how to take this  when to take this  reasons to take this  additional instructions   predniSONE 10 MG tablet Commonly known as: DELTASONE Take 4 tablets (40 mg total) by mouth daily with breakfast.   rosuvastatin 10 MG tablet Commonly known as: CRESTOR Take 1 tablet (10 mg total) by mouth daily.       Contact information for follow-up providers    Elam Dutch, MD Follow up.   Specialties: Vascular Surgery, Cardiology Why: Office will call  you with appointment day and time Contact information: [place] [address] [phone]        [name], MD. Schedule an appointment as soon as possible for a visit.   Specialty: Internal Medicine Contact information: [address] [place] [address] [phone]        Juanita Craver, MD. Schedule an appointment as soon as possible for a visit.   Specialty: Gastroenterology Contact information: [address] [place] [address] [phone]            Contact information for after-discharge care    Destination    [facility] Preferred SNF .   Service: Skilled Nursing Contact information: [address] [address] [phone]                 Allergies  Allergen Reactions  . Cephalexin Other (See Comments)    URINARY RETENTION = "blocked my urine"  . Codeine Rash    Other reaction(s): hives  . Nucynta [Tapentadol Hcl] Other (See Comments)    Altered mental status  Consultations:  Vascular surgery.  Procedures/Studies: DG Abd 1 View  Result Date: 02/06/2021 CLINICAL DATA:  Constipation. EXAM: ABDOMEN - 1 VIEW COMPARISON:  January 24, 2021. FINDINGS: No abnormal bowel dilatation is noted. Status post cholecystectomy. Mild amount of stool seen throughout the colon and rectum. No radio-opaque calculi or other significant radiographic abnormality are seen. IMPRESSION: Mild stool burden.  No abnormal bowel dilatation. Electronically Signed   By: Marijo Conception M.D.   On: 02/06/2021 11:37   DG Abdomen 1 View  Result Date: 01/22/2021 CLINICAL DATA:  Enteric catheter placement EXAM: ABDOMEN - 1 VIEW COMPARISON:  01/22/2021 FINDINGS: Frontal view of the lower chest and upper abdomen demonstrates enteric catheter passing below diaphragm tip and side port projecting over the gastric body. Lung bases are clear. Moderate stool within the colon. IMPRESSION:  1. Enteric catheter overlying gastric body. Electronically Signed   By: Randa Ngo M.D.   On: 01/22/2021 15:25   CT Abdomen Pelvis W Contrast  Result Date: 01/22/2021 CLINICAL DATA:  Nausea, vomiting, mid upper abdominal pain since last night, history hypertension, diabetes mellitus, GERD, hysterectomy, cholecystectomy, umbilical hernia repair EXAM: CT ABDOMEN AND PELVIS WITH CONTRAST TECHNIQUE: Multidetector CT imaging of the abdomen and pelvis was performed using the standard protocol following bolus administration of intravenous contrast. Sagittal and coronal MPR images reconstructed from axial data set. CONTRAST:  142m OMNIPAQUE IOHEXOL 300 MG/ML SOLN IV. No oral contrast. COMPARISON:  04/02/2020 FINDINGS: Lower chest: Bibasilar atelectasis Hepatobiliary: Gallbladder surgically absent. Fatty infiltration of liver. No focal hepatic mass. Pancreas: Normal appearance Spleen: Normal appearance. Question tiny splenules versus normal sized lymph nodes Adrenals/Urinary Tract: Thickening of adrenal glands without discrete mass. Kidneys, ureters, and bladder normal appearance Stomach/Bowel: Base of appendix is minimally thickened but remainder of appendix is normal caliber without inflammatory changes. Dilated small loops distally, some containing fluid but most distal loops contain stool sign. Associated edema within some of the mesentery of the fluid filled small bowel loops. Dilated small bowel loops with stool extend to the ileocecal valve. Prominent stool is seen throughout the colon. Mild wall thickening of the cecum and ascending colon. Stomach and remaining bowel loops unremarkable. Vascular/Lymphatic: Vascular structures patent. Atherosclerotic calcifications at the origins of the renal arteries and at aortic bifurcation. Aorta normal caliber. Few scattered normal size mesenteric and retroperitoneal lymph nodes without adenopathy. Reproductive: Uterus surgically absent. Nonvisualization of ovaries.  Other: Free fluid in pelvis, interloop, perihepatic, and minimally at RIGHT gutter. No free air. No hernia. Musculoskeletal: Osseous structures unremarkable. IMPRESSION: Partial small bowel obstruction, with dilated fluid-filled loops of small bowel as well as stool sign within distended distal small bowel loops extending to the ileocecal valve. Associated mesenteric edema within some of the fluid-filled small bowel loops in the RIGHT mid abdomen as well as mild wall thickening of the cecum. Thickening of base of appendix though the remainder of the appendix appears normal. Differential diagnosis would include stricture at the ileocecal valve with partial small bowel obstruction, inflammatory bowel disease involving the cecum and ileocecal valve with partial obstruction versus functional obstruction, or partial small bowel obstruction secondary to adhesions with associated edema and inflammation. Findings do not appear to represent appendicitis and there is no evidence of perforation or abscess. Electronically Signed   By: MLavonia DanaM.D.   On: 01/22/2021 11:23   [place]NAL  Result Date: 01/24/2021 CLINICAL DATA:  75year old female with acute kidney injury. EXAM: RENAL / URINARY TRACT ULTRASOUND COMPLETE COMPARISON:  None. FINDINGS: Right Kidney:  Renal measurements: 10.9 x 5.2 x 5.5 cm = volume: 163 mL. Echogenicity within normal limits. No mass or hydronephrosis visualized. Left Kidney: Renal measurements: 11.8 x 6.4 x 6.2 cm = volume: 244 mL. Echogenicity within normal limits. No mass or hydronephrosis visualized. Pseudo mass in the UPPER LEFT kidney noted. Bladder: Appears normal for degree of bladder distention. Other: None. IMPRESSION: Normal renal ultrasound. Electronically Signed   By: Margarette Canada M.D.   On: 01/24/2021 11:26   PERIPHERAL VASCULAR CATHETERIZATION  Result Date: 02/06/2021 Procedure: 1.  Attempted right internal jugular vein central line, placement of left internal jugular vein  triple-lumen catheter using ultrasound guidance 2.  Left leg venous thrombectomy inferior vena cava left common iliac external iliac common femoral popliteal vein 3.  Intravascular ultrasound of inferior vena cava left common iliac external iliac common femoral superficial femoral popliteal vein 4. Left common and external iliac artery venous stent (16 x 90 x 2) Operative details: After obtaining informed consent, the patient was brought to the [facility] lab.  The patient was placed in supine position on the angio table.  Both sides of her neck and chest were prepped and draped in usual sterile fashion.  Local anesthesia was infiltrated over the right internal jugular vein.  Ultrasound was used to identify the right internal jugular vein which had normal respiratory variation compressibility.  Several attempts were made to cannulate the right internal jugular vein under ultrasound guidance using a micropuncture and a standard introducer needle.  These were all unsuccessful.  At this point attempts were aborted on the right side.  Attention was turned to the left neck.  Left internal jugular vein also had normal compressibility and respiratory variation.  Using ultrasound guidance after infiltration of local anesthesia the left internal jugular vein was successfully cannulated and an 035 J-tip guidewire threaded in the central circulation.  The tract was dilated and a triple-lumen catheter placed over the guidewire into the central circulation.  This was noted to flush and draw easily.  All 3 ports flushed and drew easily.  The catheter was sutured the skin with silk sutures. At this point the patient was taken off the table and flipped into a prone position for accessing the left popliteal vein.  Sterile prep and drape was performed of the left popliteal fossa.  Ultrasound was used to identify the popliteal vein and popliteal artery.  This was fairly difficult secondary to a large amount of edema in the leg.  Micropuncture  needle was used to cannulate the vein.  Micropuncture wire was advanced into the venous system.  Micropuncture sheath was then placed over this.  On removing the dilator and guidewire were I found that we were in the popliteal artery.  Therefore the entire apparatus was removed and hemostasis obtained with direct pressure for 5 minutes.  At this point ultrasound was again used to identify the popliteal vein.  Micropuncture needle was used to cannulate the popliteal vein and a guidewire advanced.  On this occasion the blood was much darker in character and the appearance of venous blood.  A micro puncture sheath was placed over this.  Dilator and guidewire was removed and there was good venous return.  The patient was given 12,000 units of intravenous heparin.  She was given an additional 5000 units of heparin during the course of the case.  She was started on a heparin drip at 2000 units/h at the conclusion of the case.  035 Amplatz wire was then threaded up to  the level of the left subclavian vein.  An 8 French sheath was placed over this.  Intravascular ultrasound was then performed of the inferior vena cava common iliac external iliac common femoral superficial femoral and popliteal veins.  There was diffuse thrombus starting from the distal inferior vena cava and extending all the way down the left leg.  The 8 French sheath was then swapped out over the Amplatz wire for the NRE sheath.  The NRA device was deployed at approximately the L to the L3 vertebral body to capture all of the thrombus.  We measured the IVC with IVUS prior to this to make sure the NRA device would fit.  4 passes were made with retrieval of a large amount of thrombus.  At this point IVUS was placed back over the guidewire and all the thrombus had essentially been cleared.  There was a narrowing of the left common iliac vein right at its origin between 50 and 70%.  A 16 x 90 Wallstent was then brought up in the operative field and deployed  high above the iliac bifurcation.  As we fully deployed the stent unfortunately this failed down below the area of narrowing.  This required a second 16 x 90 stent.  We were able to deploy this and have it fall right at the origin of the left common iliac about 2 mm below its origin.  This was postdilated with a 16 balloon.  To 6 atm.  I then ballooned all of this all the way down to the edge of the previous stent.  Repeat IVUS was performed which showed a widely patent lumen of the left common iliac vein extending all the way to the origin and fully deployed stent.  I then placed an 035 Berenstein catheter over this and placed it at the iliac confluence.  Contrast angiogram was performed which showed again the stent was about 2 mm below the origin of the left common iliac vein and the right iliac vein was not compromised or the IVC.  At this point guidewires and sheaths were removed a Monocryl stitch was used to close the area of the popliteal fossa.  Hemostasis was obtained.  The patient was flipped back to a supine position and taken to the holding area.  The patient was in stable condition.  There were no complications. Operative management: Patient will be maintained on an heparin drip and needs full oral anticoagulation with overlap as she would be high risk of rethrombosis without complete overlap of her oral anticoagulation.  Once she is fully anticoagulated with oral anticoagulation she can discharge home from our standpoint. Dr. Scot Dock is on call this weekend if there are any questions. Chest x-ray will be obtained in the holding area to confirm the central line is in good position and no pneumothorax. Ruta Hinds, MD [facility] Office: [phone]   VAS [place] IVC/ILIAC (VENOUS ONLY)  Result Date: 02/05/2021 IVC/ILIAC STUDY Other Factors: LLE extensive DVT. Limitations: Obesity and patient discomfort.  Comparison Study: No previous Performing Technologist: Vonzell Schlatter  RVT  Examination Guidelines: A complete evaluation includes B-mode imaging, spectral Doppler, color Doppler, and power Doppler as needed of all accessible portions of each vessel. Bilateral testing is considered an integral part of a complete examination. Limited examinations for reoccurring indications may be performed as noted.  IVC/Iliac Findings: +----------+------+--------+----------------------------------------------+    IVC    PatentThrombus  Comments                    +----------+------+--------+----------------------------------------------+ IVC Prox  patent                                                       +----------+------+--------+----------------------------------------------+ IVC Mid   patent                                                       +----------+------+--------+----------------------------------------------+ IVC Distal              Possible DVT however not adequately visualized +----------+------+--------+----------------------------------------------+  +-------------------+---------+-----------+---------+-----------+--------+         CIV        RT-PatentRT-ThrombusLT-PatentLT-ThrombusComments +-------------------+---------+-----------+---------+-----------+--------+ Common Iliac Prox   patent                                          +-------------------+---------+-----------+---------+-----------+--------+ Common Iliac Mid    patent                                          +-------------------+---------+-----------+---------+-----------+--------+ Common Iliac Distal patent                                          +-------------------+---------+-----------+---------+-----------+--------+ Possible DVT in the CIV however vessel was not adequately visualized. +-------------------------+---------+-----------+---------+-----------+--------+            EIV           RT-PatentRT-ThrombusLT-PatentLT-ThrombusComments  +-------------------------+---------+-----------+---------+-----------+--------+ External Iliac Vein Prox  patent                         acute            +-------------------------+---------+-----------+---------+-----------+--------+ External Iliac Vein Mid   patent                         acute            +-------------------------+---------+-----------+---------+-----------+--------+ External Iliac Vein       patent                         acute            Distal                                                                    +-------------------------+---------+-----------+---------+-----------+--------+  Summary: IVC/Iliac: There is no evidence of thrombus involving the right common iliac vein. There is no evidence of thrombus involving the right external iliac vein. There is evidence of acute thrombus involving the left  external iliac vein. Visualization of distal Inferior Vena Cava and left proximal to distal common Iliac vein was were not well visualized.  *See table(s) above for measurements and observations.  Electronically signed by Servando Snare MD on 02/05/2021 at 2:58:01 PM.    Final    DG Chest Port 1 View  Result Date: 02/06/2021 CLINICAL DATA:  Central catheter placement EXAM: PORTABLE CHEST 1 VIEW COMPARISON:  February 06, 2021 chest radiograph obtained earlier in the day FINDINGS: Central catheter tip in superior vena cava. No pneumothorax. Subtle area of opacity noted left upper lobe. Lungs otherwise clear. Heart mildly enlarged with pulmonary vascularity normal. No adenopathy. There is aortic atherosclerosis. There is a total shoulder replacement on the right. IMPRESSION: Central catheter tip in superior vena cava. No pneumothorax. Suspect small focus of developing pneumonia left upper lobe. Lungs otherwise clear. Stable cardiac prominence. Aortic Atherosclerosis (ICD10-I70.0). Electronically Signed   By: Lowella Grip III M.D.   On: 02/06/2021 16:25   DG CHEST  PORT 1 VIEW  Result Date: 02/06/2021 CLINICAL DATA:  Fever EXAM: PORTABLE CHEST 1 VIEW COMPARISON:  01/23/2021 FINDINGS: No new consolidation or edema. No significant pleural effusion. Similar cardiomediastinal contours. Partially imaged reverse right shoulder arthroplasty. IMPRESSION: No acute process in the chest. Electronically Signed   By: Macy Mis M.D.   On: 02/06/2021 08:57   DG CHEST PORT 1 VIEW  Result Date: 01/23/2021 CLINICAL DATA:  Respiratory failure EXAM: PORTABLE CHEST 1 VIEW COMPARISON:  04/02/2020 FINDINGS: Low volume AP portable examination with perhaps minimal bibasilar atelectasis. Mild cardiomegaly. Esophagogastric tube with tip and side port below the diaphragm. IMPRESSION: 1. Low volume AP portable examination with perhaps minimal bibasilar atelectasis. 2. Mild cardiomegaly. Electronically Signed   By: Eddie Candle M.D.   On: 01/23/2021 14:44   DG Abd Portable 1V  Result Date: 01/24/2021 CLINICAL DATA:  Abdominal pain and small-bowel obstruction. EXAM: PORTABLE ABDOMEN - 1 VIEW COMPARISON:  01/23/2021 and prior studies FINDINGS: NG tube again noted with tip overlying the mid stomach. Contrast and gas-filled small bowel loops are noted, none which appear distended now. No dilated bowel loops are present. IMPRESSION: Contrast and gas-filled small bowel loops without bowel distention. Electronically Signed   By: Margarette Canada M.D.   On: 01/24/2021 09:12   DG Abd Portable 1V-Small Bowel Obstruction Protocol-24 hr delay  Result Date: 01/23/2021 CLINICAL DATA:  Abdominal pain, small-bowel obstruction EXAM: PORTABLE ABDOMEN - 1 VIEW COMPARISON:  01/23/2021 at 6:43 a.m. FINDINGS: Supine frontal views of the abdomen and pelvis are obtained. Enteric catheter tip and side port project over the gastric body. Moderate stool throughout the colon. Distended gas-filled loops of small bowel are seen within the central abdomen, measuring up to 3.4 cm in diameter. The previously administered oral  contrast is too dilute to be visualized. No acute bony abnormalities. IMPRESSION: 1. Persisting gaseous distension of small bowel consistent with ileus or obstruction. The previously administered oral contrast is too dilute to be identified on this exam. 2. Moderate stool throughout the colon. Electronically Signed   By: Randa Ngo M.D.   On: 01/23/2021 23:29   DG Abd Portable 1V-Small Bowel Obstruction Protocol-initial, 8 hr delay  Result Date: 01/23/2021 CLINICAL DATA:  Recent small-bowel obstruction EXAM: PORTABLE ABDOMEN - 1 VIEW COMPARISON:  CT abdomen and pelvis January 22, 2021; abdominal radiograph January 22, 2021 FINDINGS: Nasogastric tube tip and side port in stomach. There is diffuse stool throughout the colon. There is oral contrast in the stomach; oral  contrast seen beyond the stomach. There is intravenous contrast in the urinary bladder. There is currently no appreciable bowel dilatation or air-fluid level to suggest bowel obstruction. No free air. Scattered calcifications noted on the left which may have vascular etiology. Visualized lung bases clear. IMPRESSION: Nasogastric tube tip and side port in stomach. No contrast beyond the stomach. Question a degree of ileus. Diffuse stool throughout colon. No bowel dilatation or air-fluid level to suggest bowel obstruction by radiography. No free air evident. Electronically Signed   By: Lowella Grip III M.D.   On: 01/23/2021 08:24   MM 3D SCREEN BREAST BILATERAL  Result Date: 01/15/2021 CLINICAL DATA:  Screening. EXAM: DIGITAL SCREENING BILATERAL MAMMOGRAM WITH TOMOSYNTHESIS AND CAD TECHNIQUE: Bilateral screening digital craniocaudal and mediolateral oblique mammograms were obtained. Bilateral screening digital breast tomosynthesis was performed. The images were evaluated with computer-aided detection. COMPARISON:  Previous exam(s). ACR Breast Density Category c: The breast tissue is heterogeneously dense, which may obscure small masses.  FINDINGS: There are no findings suspicious for malignancy. The images were evaluated with computer-aided detection. IMPRESSION: No mammographic evidence of malignancy. A result letter of this screening mammogram will be mailed directly to the patient. RECOMMENDATION: Screening mammogram in one year. (Code:SM-B-01Y) BI-RADS CATEGORY  1: Negative. Electronically Signed   By: Franki Cabot M.D.   On: 01/15/2021 13:46   VAS [place] LOWER EXTREMITY VENOUS (DVT) (MC and WL 7a-7p)  Result Date: 02/04/2021  Lower Venous DVT Study Indications: Pain, and Swelling.  Risk Factors: None identified. Comparison Study: Previous 8/21 Neg Performing Technologist: Vonzell Schlatter RVT  Examination Guidelines: A complete evaluation includes B-mode imaging, spectral Doppler, color Doppler, and power Doppler as needed of all accessible portions of each vessel. Bilateral testing is considered an integral part of a complete examination. Limited examinations for reoccurring indications may be performed as noted. The reflux portion of the exam is performed with the patient in reverse Trendelenburg.  +---------+---------------+---------+-----------+----------+--------------+ LEFT     CompressibilityPhasicitySpontaneityPropertiesThrombus Aging +---------+---------------+---------+-----------+----------+--------------+ CFV      None                                         Acute          +---------+---------------+---------+-----------+----------+--------------+ SFJ      None                                         Acute          +---------+---------------+---------+-----------+----------+--------------+ FV Prox  None                                         Acute          +---------+---------------+---------+-----------+----------+--------------+ FV Mid   None                                         Acute          +---------+---------------+---------+-----------+----------+--------------+ FV DistalNone  Acute          +---------+---------------+---------+-----------+----------+--------------+ PFV      None                                         Acute          +---------+---------------+---------+-----------+----------+--------------+ POP      None                                         Acute          +---------+---------------+---------+-----------+----------+--------------+ PTV      None                                         Acute          +---------+---------------+---------+-----------+----------+--------------+ PERO     None                                         Acute          +---------+---------------+---------+-----------+----------+--------------+  Summary: LEFT: - Findings consistent with acute deep vein thrombosis involving the left common femoral vein, SF junction, left femoral vein, left proximal profunda vein, left popliteal vein, left posterior tibial veins, and left peroneal veins.  *See table(s) above for measurements and observations. Electronically signed by Harold Barban MD on 02/04/2021 at 9:58:10 PM.    Final     Subjective: Patient was seen and examined today.  No new complaint.  She was eating breakfast comfortably.  Much improvement in left lower extremity swelling.  Compression stockings were on.  Ready to go to rehab.  Discharge Exam: Vitals:   02/10/21 0836 02/10/21 1215  BP: (!) 156/79 (!) 153/84  Pulse: 67 68  Resp: 14 20  Temp: 98.3 F (36.8 C) 98.4 F (36.9 C)  SpO2: 100% 100%   Vitals:   02/09/21 2336 02/10/21 0408 02/10/21 0836 02/10/21 1215  BP: (!) 147/70 (!) 158/78 (!) 156/79 (!) 153/84  Pulse: 69 66 67 68  Resp: _0 Temp: 98.1 F (36.7 C) 98.1 F (36.7 C) 98.3 F (36.8 C) 98.4 F (36.9 C)  TempSrc: Oral Oral Oral Oral  SpO2: 98% 98% 100% 100%  Weight:      Height:        General: Pt is alert, awake, not in acute distress Cardiovascular: RRR, S1/S2 +, no rubs, no  gallops Respiratory: CTA bilaterally, no wheezing, no rhonchi Abdominal: Soft, NT, ND, bowel sounds + Extremities: no edema, no cyanosis   The results of significant diagnostics from this hospitalization (including imaging, microbiology, ancillary and laboratory) are listed below for reference.    Microbiology: Recent Results (from the past 240 hour(s))  SARS CORONAVIRUS 2 (TAT 6-24 HRS) Nasopharyngeal Nasopharyngeal Swab     Status: None   Collection Time: 02/04/21  6:03 PM   Specimen: Nasopharyngeal Swab  Result Value Ref Range Status   SARS Coronavirus 2 NEGATIVE NEGATIVE Final    Comment: (NOTE) SARS-CoV-2 target nucleic acids are NOT DETECTED.  The SARS-CoV-2 RNA is generally detectable in upper and lower respiratory specimens during the acute  phase of infection. Negative results do not preclude SARS-CoV-2 infection, do not rule out co-infections with other pathogens, and should not be used as the sole basis for treatment or other patient management decisions. Negative results must be combined with clinical observations, patient history, and epidemiological information. The expected result is Negative.  Fact Sheet for Patients: SugarRoll.be  Fact Sheet for Healthcare Providers: https://www.woods-mathews.com/  This test is not yet approved or cleared by the [place] FDA and  has been authorized for detection and/or diagnosis of SARS-CoV-2 by FDA under an Emergency Use Authorization (EUA). This EUA will remain  in effect (meaning this test can be used) for the duration of the COVID-19 declaration under Se ction 564(b)(1) of the Act, 21 U.S.C. section 360bbb-3(b)(1), unless the authorization is terminated or revoked sooner.  Performed at [facility], [place] [address] [place], [place] [address]   Culture, blood (routine x 2)     Status: None (Preliminary result)   Collection Time: 02/06/21  8:20 AM   Specimen: BLOOD  LEFT HAND  Result Value Ref Range Status   Specimen Description BLOOD LEFT HAND  Final   Special Requests   Final    BOTTLES DRAWN AEROBIC ONLY Blood Culture results may not be optimal due to an inadequate volume of blood received in culture bottles   Culture   Final    NO GROWTH 4 DAYS Performed at [facility], [place] [address], [place], [place] [address]    Report Status PENDING  Incomplete  Culture, blood (routine x 2)     Status: None (Preliminary result)   Collection Time: 02/06/21  8:20 AM   Specimen: BLOOD RIGHT HAND  Result Value Ref Range Status   Specimen Description BLOOD RIGHT HAND  Final   Special Requests   Final    BOTTLES DRAWN AEROBIC AND ANAEROBIC Blood Culture adequate volume   Culture   Final    NO GROWTH 4 DAYS Performed at [facility], [place] [address], [place], [place] [address]    Report Status PENDING  Incomplete  SARS CORONAVIRUS 2 (TAT 6-24 HRS) Nasopharyngeal Nasopharyngeal Swab     Status: None   Collection Time: 02/09/21 11:48 AM   Specimen: Nasopharyngeal Swab  Result Value Ref Range Status   SARS Coronavirus 2 NEGATIVE NEGATIVE Final    Comment: (NOTE) SARS-CoV-2 target nucleic acids are NOT DETECTED.  The SARS-CoV-2 RNA is generally detectable in upper and lower respiratory specimens during the acute phase of infection. Negative results do not preclude SARS-CoV-2 infection, do not rule out co-infections with other pathogens, and should not be used as the sole basis for treatment or other patient management decisions. Negative results must be combined with clinical observations, patient history, and epidemiological information. The expected result is Negative.  Fact Sheet for Patients: SugarRoll.be  Fact Sheet for Healthcare Providers: https://www.woods-mathews.com/  This test is not yet approved or cleared by the [place] FDA and  has been authorized for detection and/or diagnosis of  SARS-CoV-2 by FDA under an Emergency Use Authorization (EUA). This EUA will remain  in effect (meaning this test can be used) for the duration of the COVID-19 declaration under Se ction 564(b)(1) of the Act, 21 U.S.C. section 360bbb-3(b)(1), unless the authorization is terminated or revoked sooner.  Performed at [facility], [place] [address], [place], [place] [address]      Labs: BNP (last 3 results) No results for input(s): BNP in the last 8760 hours.  Basic Metabolic Panel: Recent Labs  Lab 02/04/21 2044 02/05/21 2032 02/06/21 0535 02/07/21 0828  NA 138 138 138 139  K 3.7 5.1 3.6 3.5  CL 108 108 107 110  CO2 21* 21* 20* 25  GLUCOSE 131* 214* 174* 138*  BUN _0 7*  CREATININE 0.52 0.72 0.69 0.59  CALCIUM 8.6* 8.6* 8.5* 7.8*   Liver Function Tests: No results for input(s): AST, ALT, ALKPHOS, BILITOT, PROT, ALBUMIN in the last 168 hours. No results for input(s): LIPASE, AMYLASE in the last 168 hours. No results for input(s): AMMONIA in the last 168 hours. CBC: Recent Labs  Lab 02/06/21 1917 02/07/21 0828 02/08/21 0400 02/09/21 0628 02/10/21 0407  WBC 17.1* 11.4* 9.8 8.2 7.6  HGB 12.2 9.9* 9.1* 9.5* 9.6*  HCT 37.1 30.8* 28.0* 28.7* 29.2*  MCV 90.3 90.3 90.9 90.0 90.1  PLT 169 141* 146* 161 168   Cardiac Enzymes: No results for input(s): CKTOTAL, CKMB, CKMBINDEX, TROPONINI in the last 168 hours. BNP: Invalid input(s): POCBNP CBG: Recent Labs  Lab 02/09/21 2020 02/09/21 2338 02/10/21 0403 02/10/21 0958 02/10/21 1113  GLUCAP 293* 230* 123* 135* 237*   D-Dimer No results for input(s): DDIMER in the last 72 hours. Hgb A1c No results for input(s): HGBA1C in the last 72 hours. Lipid Profile No results for input(s): CHOL, HDL, LDLCALC, TRIG, CHOLHDL, LDLDIRECT in the last 72 hours. Thyroid function studies No results for input(s): TSH, T4TOTAL, T3FREE, THYROIDAB in the last 72 hours.  Invalid input(s): FREET3 Anemia work up No results for  input(s): VITAMINB12, FOLATE, FERRITIN, TIBC, IRON, RETICCTPCT in the last 72 hours. Urinalysis    Component Value Date/Time   COLORURINE YELLOW 01/24/2021 0805   APPEARANCEUR CLEAR 01/24/2021 0805   LABSPEC 1.024 01/24/2021 0805   PHURINE 5.0 01/24/2021 0805   GLUCOSEU NEGATIVE 01/24/2021 0805   HGBUR NEGATIVE 01/24/2021 0805   BILIRUBINUR NEGATIVE 01/24/2021 0805   KETONESUR NEGATIVE 01/24/2021 0805   PROTEINUR NEGATIVE 01/24/2021 0805   UROBILINOGEN 0.2 06/15/2014 0524   NITRITE NEGATIVE 01/24/2021 0805   LEUKOCYTESUR NEGATIVE 01/24/2021 0805   Sepsis Labs Invalid input(s): PROCALCITONIN,  WBC,  LACTICIDVEN Microbiology Recent Results (from the past 240 hour(s))  SARS CORONAVIRUS 2 (TAT 6-24 HRS) Nasopharyngeal Nasopharyngeal Swab     Status: None   Collection Time: 02/04/21  6:03 PM   Specimen: Nasopharyngeal Swab  Result Value Ref Range Status   SARS Coronavirus 2 NEGATIVE NEGATIVE Final    Comment: (NOTE) SARS-CoV-2 target nucleic acids are NOT DETECTED.  The SARS-CoV-2 RNA is generally detectable in upper and lower respiratory specimens during the acute phase of infection. Negative results do not preclude SARS-CoV-2 infection, do not rule out co-infections with other pathogens, and should not be used as the sole basis for treatment or other patient management decisions. Negative results must be combined with clinical observations, patient history, and epidemiological information. The expected result is Negative.  Fact Sheet for Patients: SugarRoll.be  Fact Sheet for Healthcare Providers: https://www.woods-mathews.com/  This test is not yet approved or cleared by the [place] FDA and  has been authorized for detection and/or diagnosis of SARS-CoV-2 by FDA under an Emergency Use Authorization (EUA). This EUA will remain  in effect (meaning this test can be used) for the duration of the COVID-19 declaration under Se  ction 564(b)(1) of the Act, 21 U.S.C. section 360bbb-3(b)(1), unless the authorization is terminated or revoked sooner.  Performed at [facility], [place] [address], [place], [place] [address]  Culture, blood (routine x 2)     Status: None (Preliminary result)   Collection Time: 02/06/21  8:20 AM   Specimen: BLOOD LEFT HAND  Result Value Ref Range Status   Specimen Description BLOOD LEFT HAND  Final   Special Requests   Final    BOTTLES DRAWN AEROBIC ONLY Blood Culture results may not be optimal due to an inadequate volume of blood received in culture bottles   Culture   Final    NO GROWTH 4 DAYS Performed at [facility], [place] [address] [place], [place] [address]    Report Status PENDING  Incomplete  Culture, blood (routine x 2)     Status: None (Preliminary result)   Collection Time: 02/06/21  8:20 AM   Specimen: BLOOD RIGHT HAND  Result Value Ref Range Status   Specimen Description BLOOD RIGHT HAND  Final   Special Requests   Final    BOTTLES DRAWN AEROBIC AND ANAEROBIC Blood Culture adequate volume   Culture   Final    NO GROWTH 4 DAYS Performed at [facility], [place] [address], [place], [place] [address]    Report Status PENDING  Incomplete  SARS CORONAVIRUS 2 (TAT 6-24 HRS) Nasopharyngeal Nasopharyngeal Swab     Status: None   Collection Time: 02/09/21 11:48 AM   Specimen: Nasopharyngeal Swab  Result Value Ref Range Status   SARS Coronavirus 2 NEGATIVE NEGATIVE Final    Comment: (NOTE) SARS-CoV-2 target nucleic acids are NOT DETECTED.  The SARS-CoV-2 RNA is generally detectable in upper and lower respiratory specimens during the acute phase of infection. Negative results do not preclude SARS-CoV-2 infection, do not rule out co-infections with other pathogens, and should not be used as the sole basis for treatment or other patient management decisions. Negative results must be combined with clinical observations, patient history, and epidemiological  information. The expected result is Negative.  Fact Sheet for Patients: SugarRoll.be  Fact Sheet for Healthcare Providers: https://www.woods-mathews.com/  This test is not yet approved or cleared by the [place] FDA and  has been authorized for detection and/or diagnosis of SARS-CoV-2 by FDA under an Emergency Use Authorization (EUA). This EUA will remain  in effect (meaning this test can be used) for the duration of the COVID-19 declaration under Se ction 564(b)(1) of the Act, 21 U.S.C. section 360bbb-3(b)(1), unless the authorization is terminated or revoked sooner.  Performed at [facility], [place] [address], [place], [place] [address]     Time coordinating discharge: Over 30 minutes  SIGNED:  Lorella Nimrod, MD  [facility] 02/10/2021, 1:10 PM  If 7PM-7AM, please contact night-coverage [email or web address]  This record has been created using Systems analyst. Errors have been sought and corrected,but may not always be located. Such creation errors do not reflect on the standard of care.

## 2021-02-10 NOTE — Plan of Care (Signed)
  Problem: Education: Goal: Knowledge of General Education information will improve Description: Including pain rating scale, medication(s)/side effects and non-pharmacologic comfort measures 02/10/2021 1220 by Raelyn Number, RN Outcome: Adequate for Discharge 02/10/2021 1220 by Raelyn Number, RN Outcome: Progressing   Problem: Health Behavior/Discharge Planning: Goal: Ability to manage health-related needs will improve 02/10/2021 1220 by Raelyn Number, RN Outcome: Adequate for Discharge 02/10/2021 1220 by Raelyn Number, RN Outcome: Progressing   Problem: Clinical Measurements: Goal: Ability to maintain clinical measurements within normal limits will improve 02/10/2021 1220 by Raelyn Number, RN Outcome: Adequate for Discharge 02/10/2021 1220 by Raelyn Number, RN Outcome: Progressing Goal: Will remain free from infection 02/10/2021 1220 by Raelyn Number, RN Outcome: Adequate for Discharge 02/10/2021 1220 by Raelyn Number, RN Outcome: Progressing Goal: Diagnostic test results will improve 02/10/2021 1220 by Raelyn Number, RN Outcome: Adequate for Discharge 02/10/2021 1220 by Raelyn Number, RN Outcome: Progressing Goal: Respiratory complications will improve 02/10/2021 1220 by Raelyn Number, RN Outcome: Adequate for Discharge 02/10/2021 1220 by Raelyn Number, RN Outcome: Progressing Goal: Cardiovascular complication will be avoided 02/10/2021 1220 by Raelyn Number, RN Outcome: Adequate for Discharge 02/10/2021 1220 by Raelyn Number, RN Outcome: Progressing   Problem: Activity: Goal: Risk for activity intolerance will decrease 02/10/2021 1220 by Raelyn Number, RN Outcome: Adequate for Discharge 02/10/2021 1220 by Raelyn Number, RN Outcome: Progressing   Problem: Nutrition: Goal: Adequate nutrition will be maintained 02/10/2021 1220 by Raelyn Number, RN Outcome: Adequate for Discharge 02/10/2021 1220 by Raelyn Number, RN Outcome:  Progressing   Problem: Coping: Goal: Level of anxiety will decrease 02/10/2021 1220 by Raelyn Number, RN Outcome: Adequate for Discharge 02/10/2021 1220 by Raelyn Number, RN Outcome: Progressing   Problem: Elimination: Goal: Will not experience complications related to bowel motility 02/10/2021 1220 by Raelyn Number, RN Outcome: Adequate for Discharge 02/10/2021 1220 by Raelyn Number, RN Outcome: Progressing Goal: Will not experience complications related to urinary retention 02/10/2021 1220 by Raelyn Number, RN Outcome: Adequate for Discharge 02/10/2021 1220 by Raelyn Number, RN Outcome: Progressing   Problem: Pain Managment: Goal: General experience of comfort will improve 02/10/2021 1220 by Raelyn Number, RN Outcome: Adequate for Discharge 02/10/2021 1220 by Raelyn Number, RN Outcome: Progressing   Problem: Safety: Goal: Ability to remain free from injury will improve 02/10/2021 1220 by Raelyn Number, RN Outcome: Adequate for Discharge 02/10/2021 1220 by Raelyn Number, RN Outcome: Progressing   Problem: Skin Integrity: Goal: Risk for impaired skin integrity will decrease 02/10/2021 1220 by Raelyn Number, RN Outcome: Adequate for Discharge 02/10/2021 1220 by Raelyn Number, RN Outcome: Progressing

## 2021-02-10 NOTE — Progress Notes (Signed)
Pt d/c'd to [facility] via [facility]. Attempted report x2. No response from receiving RN.

## 2021-02-10 NOTE — Progress Notes (Signed)
Left IJ removed for discharge. Pt tolerated well. Catheter intact. Pressure applied. Gauze and tegaderm dressing placed. No bleeding noted.

## 2021-02-10 NOTE — Progress Notes (Signed)
Mobility Specialist: Progress Note   02/10/21 1300  Mobility  Activity Ambulated in hall  Level of Assistance Contact guard assist, steadying assist  Assistive Device Front wheel walker  Distance Ambulated (ft) 150 ft  Mobility Response Tolerated well  Mobility performed by Mobility specialist  $Mobility charge 1 Mobility   Post-Mobility: 101 HR, 96% SpO2  Pt c/o tightness behind L knee during ambulation which she attributed to being in the bed for so long, otherwise asx. Pt back to bed per RN request.   Harrell Gave Diane [name] Mobility Specialist Mobility Specialist Phone: [phone]

## 2021-02-10 NOTE — TOC Transition Note (Signed)
Transition of Care [facility]) - CM/SW Discharge Note   Patient Details  Name: ONETA SIGMAN MRN: [id number] Date of Birth: 10/29/1946  [facility] CM/SW Contact:  Vinie Sill, LCSW Phone Number: 02/10/2021, 11:42 AM   Clinical Narrative:     Patient will Discharge to: [facility]  Discharge Date:02/10/2021 Family Notified: Tonia,daughter Transport CQ:PEAK  Per MD patient is ready for discharge. RN, patient, and facility notified of discharge. Discharge Summary sent to facility. RN given number for [phone]. Ambulance transport requested for patient.   Clinical Social Worker signing off.  Thurmond Butts, MSW, LCSW Clinical Social Worker  Final next level of care: Skilled Nursing Facility Barriers to Discharge: Barriers Resolved   Patient Goals and CMS Choice   CMS Medicare.gov Compare Post Acute Care list provided to:: Patient Choice offered to / list presented to : Patient  Discharge Placement              Patient [facility] bed at: [facility] Patient to be transferred to facility by: [facility] Name of family member notified: Tonia,daughter Patient and family notified of of transfer: 02/10/21  Discharge Plan and Services                                     Social Determinants of Health (SDOH) Interventions     Readmission Risk Interventions No flowsheet data found.

## 2021-02-11 ENCOUNTER — Encounter: Payer: Self-pay | Admitting: Adult Health

## 2021-02-11 ENCOUNTER — Non-Acute Institutional Stay (SKILLED_NURSING_FACILITY): Payer: Medicare Other | Admitting: Adult Health

## 2021-02-11 DIAGNOSIS — K56609 Unspecified intestinal obstruction, unspecified as to partial versus complete obstruction: Secondary | ICD-10-CM | POA: Diagnosis not present

## 2021-02-11 DIAGNOSIS — M05742 Rheumatoid arthritis with rheumatoid factor of left hand without organ or systems involvement: Secondary | ICD-10-CM

## 2021-02-11 DIAGNOSIS — M05741 Rheumatoid arthritis with rheumatoid factor of right hand without organ or systems involvement: Secondary | ICD-10-CM | POA: Diagnosis not present

## 2021-02-11 DIAGNOSIS — E1140 Type 2 diabetes mellitus with diabetic neuropathy, unspecified: Secondary | ICD-10-CM

## 2021-02-11 DIAGNOSIS — I82402 Acute embolism and thrombosis of unspecified deep veins of left lower extremity: Secondary | ICD-10-CM

## 2021-02-11 DIAGNOSIS — G63 Polyneuropathy in diseases classified elsewhere: Secondary | ICD-10-CM | POA: Diagnosis not present

## 2021-02-11 DIAGNOSIS — I10 Essential (primary) hypertension: Secondary | ICD-10-CM | POA: Diagnosis not present

## 2021-02-11 LAB — CULTURE, BLOOD (ROUTINE X 2)
Culture: NO GROWTH
Culture: NO GROWTH
Special Requests: ADEQUATE

## 2021-02-11 NOTE — Progress Notes (Addendum)
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 cardiomediastinal contours. Partially imaged reverse right shoulder arthroplasty. IMPRESSION: No acute process in the chest. Electronically Signed   By: Macy Mis M.D.   On: 02/06/2021 08:57   DG CHEST PORT 1 VIEW  Result Date: 01/23/2021 CLINICAL DATA:  Respiratory failure EXAM: PORTABLE CHEST 1 VIEW COMPARISON:  04/02/2020 FINDINGS: Low volume AP portable examination with perhaps minimal bibasilar atelectasis. Mild cardiomegaly. Esophagogastric tube with tip and side port below the diaphragm. IMPRESSION: 1. Low volume AP portable examination with perhaps minimal bibasilar atelectasis. 2. Mild cardiomegaly. Electronically Signed   By: Eddie Candle M.D.   On: 01/23/2021 14:44   DG Abd Portable 1V  Result Date: 01/24/2021 CLINICAL DATA:  Abdominal pain and small-bowel obstruction. EXAM: PORTABLE ABDOMEN - 1 VIEW COMPARISON:  01/23/2021 and prior studies FINDINGS: NG tube again noted with tip overlying the mid stomach.  Contrast and gas-filled small bowel loops are noted, none which appear distended now. No dilated bowel loops are present. IMPRESSION: Contrast and gas-filled small bowel loops without bowel distention. Electronically Signed   By: Margarette Canada M.D.   On: 01/24/2021 09:12   DG Abd Portable 1V-Small Bowel Obstruction Protocol-24 hr delay  Result Date: 01/23/2021 CLINICAL DATA:  Abdominal pain, small-bowel obstruction EXAM: PORTABLE ABDOMEN - 1 VIEW COMPARISON:  01/23/2021 at 6:43 a.m. FINDINGS: Supine frontal views of the abdomen and pelvis are obtained. Enteric catheter tip and side port project over the gastric body. Moderate stool throughout the colon. Distended gas-filled loops of small bowel are seen within the central abdomen, measuring up to 3.4 cm in diameter. The previously administered oral contrast is too dilute to be visualized. No acute bony abnormalities. IMPRESSION: 1. Persisting gaseous distension of small bowel consistent with ileus or obstruction. The previously administered oral contrast is too dilute to be identified on this exam. 2. Moderate stool throughout the colon. Electronically Signed   By: Randa Ngo M.D.   On: 01/23/2021 23:29   DG Abd Portable 1V-Small Bowel Obstruction Protocol-initial, 8 hr delay  Result Date: 01/23/2021 CLINICAL DATA:  Recent small-bowel obstruction EXAM: PORTABLE ABDOMEN - 1 VIEW COMPARISON:  CT abdomen and pelvis January 22, 2021; abdominal radiograph January 22, 2021 FINDINGS: Nasogastric tube tip and side port in stomach. There is diffuse stool throughout the colon. There is oral contrast in the stomach; oral  contrast seen beyond the stomach. There is intravenous contrast in the urinary bladder. There is currently no appreciable bowel dilatation or air-fluid level to suggest bowel obstruction. No free air. Scattered calcifications noted on the left which may have vascular etiology. Visualized lung bases clear. IMPRESSION: Nasogastric tube tip and side port in  stomach. No contrast beyond the stomach. Question a degree of ileus. Diffuse stool throughout colon. No bowel dilatation or air-fluid level to suggest bowel obstruction by radiography. No free air evident. Electronically Signed   By: Lowella Grip III M.D.   On: 01/23/2021 08:24   MM 3D SCREEN BREAST BILATERAL  Result Date: 01/15/2021 CLINICAL DATA:  Screening. EXAM: DIGITAL SCREENING BILATERAL MAMMOGRAM WITH TOMOSYNTHESIS AND CAD TECHNIQUE: Bilateral screening digital craniocaudal and mediolateral oblique mammograms were obtained. Bilateral screening digital breast tomosynthesis was performed. The images were evaluated with computer-aided detection. COMPARISON:  Previous exam(s). ACR Breast Density Category c: The breast tissue is heterogeneously dense, which may obscure small masses. FINDINGS: There are no findings suspicious for malignancy. The images were evaluated with computer-aided detection. IMPRESSION: No mammographic evidence of malignancy. A result letter of this screening mammogram will be mailed directly to the patient. RECOMMENDATION: Screening mammogram in one year. (Code:SM-B-01Y) BI-RADS CATEGORY  1: Negative. Electronically Signed   By: Franki Cabot M.D.   On: 01/15/2021 13:46   VAS [place] LOWER EXTREMITY VENOUS (DVT) ([facility] and [facility] 7a-7p)  Result Date: 02/04/2021  Lower Venous DVT Study Indications: Pain, and Swelling.  Risk Factors: None identified. Comparison Study: Previous 8/21 Neg Performing Technologist: Vonzell Schlatter RVT  Examination Guidelines: A complete evaluation includes B-mode imaging, spectral Doppler, color Doppler, and power Doppler as needed of all accessible portions of each vessel. Bilateral testing is considered an integral part of a complete examination. Limited examinations for reoccurring indications may be performed as noted. The reflux portion of the exam is performed with the patient in reverse Trendelenburg.   +---------+---------------+---------+-----------+----------+--------------+ LEFT     CompressibilityPhasicitySpontaneityPropertiesThrombus Aging +---------+---------------+---------+-----------+----------+--------------+ CFV      None                                         Acute          +---------+---------------+---------+-----------+----------+--------------+ SFJ      None                                         Acute          +---------+---------------+---------+-----------+----------+--------------+ FV Prox  None                                         Acute          +---------+---------------+---------+-----------+----------+--------------+ FV Mid   None                                         Acute          +---------+---------------+---------+-----------+----------+--------------+ FV DistalNone  Acute          +---------+---------------+---------+-----------+----------+--------------+ PFV      None                                         Acute          +---------+---------------+---------+-----------+----------+--------------+ POP      None                                         Acute          +---------+---------------+---------+-----------+----------+--------------+ PTV      None                                         Acute          +---------+---------------+---------+-----------+----------+--------------+ PERO     None                                         Acute          +---------+---------------+---------+-----------+----------+--------------+  Summary: LEFT: - Findings consistent with acute deep vein thrombosis involving the left common femoral vein, SF junction, left femoral vein, left proximal profunda vein, left popliteal vein, left posterior tibial veins, and left peroneal veins.  *See table(s) above for measurements and observations. Electronically signed by Harold Barban MD on  02/04/2021 at 9:58:10 PM.    Final     Assessment/Plan  1. Acute deep vein thrombosis (DVT) of left lower extremity, unspecified vein (HCC) -  S/P thrombectomy and thrombolysis on 02/06/21 -Continue Eliquis -   Follow-up with vascular surgery in 2 weeks  2. SBO (small bowel obstruction) (HCC) -  Will taper prednisone 40 mg daily to 20 mg daily -   Follow-up with GI  3. Rheumatoid arthritis involving both hands with positive rheumatoid factor (HCC) -Continue methotrexate 20 mg once 7 days  4. Type 2 diabetes mellitus with diabetic neuropathy, without long-term current use of insulin (HCC) Lab Results  Component Value Date   HGBA1C 7.3 (H) 01/22/2021   -Continue metformin and CBG checks  5. Primary hypertension -Continue amlodipine  6. Polyneuropathy associated with underlying disease ([place]) -Continue gabapentin and Cymbalta     Family/ staff Communication: Discussed plan of care with resident and charge nurse.  Labs/tests ordered: X-ray of right neck nodule  Goals of care:   Short-term care   Durenda Age, DNP, MSN, FNP-BC [facility] [phone] (Monday-Friday 8:00 a.m. - 5:00 p.m.) [phone] (after hours)

## 2021-02-12 ENCOUNTER — Encounter: Payer: Self-pay | Admitting: Internal Medicine

## 2021-02-12 ENCOUNTER — Non-Acute Institutional Stay (SKILLED_NURSING_FACILITY): Payer: Medicare Other | Admitting: Internal Medicine

## 2021-02-12 DIAGNOSIS — L03221 Cellulitis of neck: Secondary | ICD-10-CM | POA: Diagnosis not present

## 2021-02-12 DIAGNOSIS — E1140 Type 2 diabetes mellitus with diabetic neuropathy, unspecified: Secondary | ICD-10-CM | POA: Diagnosis not present

## 2021-02-12 DIAGNOSIS — I82402 Acute embolism and thrombosis of unspecified deep veins of left lower extremity: Secondary | ICD-10-CM | POA: Diagnosis not present

## 2021-02-12 DIAGNOSIS — K56609 Unspecified intestinal obstruction, unspecified as to partial versus complete obstruction: Secondary | ICD-10-CM | POA: Diagnosis not present

## 2021-02-12 DIAGNOSIS — D649 Anemia, unspecified: Secondary | ICD-10-CM | POA: Insufficient documentation

## 2021-02-12 LAB — FACTOR 5 LEIDEN

## 2021-02-12 NOTE — Assessment & Plan Note (Signed)
Monitor for bleeding dyscrasias while on the Eliquis.

## 2021-02-12 NOTE — Progress Notes (Signed)
NURSING HOME LOCATION:  [facility] ROOM NUMBER:  [id number]  CODE STATUS:  Full  PCP:  [name] MD  This is a comprehensive admission note to this SNFperformed on this date less than 30 days from date of admission. Included are preadmission medical/surgical history; reconciled medication list; family history; social history and comprehensive review of systems.  Corrections and additions to the records were documented. Comprehensive physical exam was also performed. Additionally a clinical summary was entered for each active diagnosis pertinent to this admission in the Problem List to enhance continuity of care.  HPI: Patient was hospitalized 4/6 - 02/10/2021 complaining of left leg pain and swelling.  In the ED Doppler revealed left common femoral vein, saphenous junction, left femoral vein, left proximal profunda vein, left popliteal vein, left posterior tibial veins and left peroneal veins with thrombus.  IV heparin was initiated and Vascular Surgery consulted.  Mechanical thrombectomy of the IVC and left iliac vein/left common iliac vein stenting and thrombolysis  was was completed by Dr. Deitra Mayo. Heparin was transitioned to Eliquis postop.  Thigh length compression stockings and elevation of the lower extremity were initiated. The extensive thrombosis was in the context of recent hospitalization 3/24-2/1 for partial small bowel obstruction which was treated nonoperatively.  Etiology was felt to be stricture versus inflammation.  At that time she was started on prednisone 40 mg daily by Dr. Collene Mares, GI.  This was to be tapered as an outpatient. Patient did have some abdominal pain during the hospitalization for the thrombotic processes.  Imaging was positive for mild stool burden with no other abnormalities.  Bowel regimen was initiated.  Past medical and surgical history: Includes history of pulmonary edema, rheumatoid arthritis, prolonged QT interval on  EKG, essential hypertension, GERD, and diabetes with Surgeries and procedures include abdominal hysterectomy, breast biopsy, cholecystectomy, hiatal hernia repair with mesh, and multiple shoulder surgeries  Social history: No history of alcohol intake; never smoked.  Family history: Reviewed   Review of systems: She continues to have tightness in the leg at the site of the thrombus surgery.   She has noted some swelling at the base of the right neck since admission.  She is unsure whether IV access was attempted that site or not.   She describes urinary frequency.  She has numbness and tingling in her fingers. The patient had been on methotrexate for her rheumatoid arthritis; she states that Plaquenil has been discontinued by her Rheumatologist recently.  Constitutional: No fever, significant weight change  Eyes: No redness, discharge, pain, vision change ENT/mouth: No nasal congestion, purulent discharge, earache, change in hearing, sore throat  Cardiovascular: No chest pain, palpitations, paroxysmal nocturnal dyspnea, claudication, edema  Respiratory: No cough, sputum production, hemoptysis, DOE, significant snoring, apnea Gastrointestinal: No heartburn, dysphagia, abdominal pain, nausea /vomiting, rectal bleeding, melena, change in bowels Genitourinary: No dysuria, hematuria, pyuria, incontinence, nocturia Dermatologic: No rash, pruritus Neurologic: No dizziness, headache, syncope, seizures Psychiatric: No significant anxiety, depression, insomnia, anorexia Endocrine: No change in hair/skin/nails, excessive thirst, excessive hunger, excessive urination ( + for frequency) Hematologic/lymphatic: No significant bruising, lymphadenopathy, abnormal bleeding Allergy/immunology: No itchy/watery eyes, significant sneezing, urticaria, angioedema  Physical exam:  Pertinent or positive findings: She has an upper partial.  Slight ptosis is present on the right.  She has a grade 1/2-3/4 murmur at  the base.  Breath sounds are decreased.  The left dorsalis pedis pulse is stronger than the right.  The dorsalis pedis pulses are stronger than posterior  tibial pulses.  She has nonpitting edema of the lower extremities.  There is hyperpigmentation of the shins.  There is marked antecubital bruising bilaterally. A small SQ nodule vs hematoma with surrounding mild erythema present @ base of neck on right.  General appearance: Adequately nourished; no acute distress, increased work of breathing is present.   Lymphatic: No lymphadenopathy about the head, neck (despite changes on R above), axilla. Eyes: No conjunctival inflammation or lid edema is present. There is no scleral icterus. Ears:  External ear exam shows no significant lesions or deformities.   Nose:  External nasal examination shows no deformity or inflammation. Nasal mucosa are pink and moist without lesions, exudates Oral exam: Lips and gums are healthy appearing.There is no oropharyngeal erythema or exudate. Neck:  No thyromegaly, masses, tenderness noted.    Heart:  Normal rate and regular rhythm. S1 and S2 normal without gallop, click, rub.  Lungs:  without wheezes, rhonchi, rales, rubs. Abdomen: Bowel sounds are normal.  Abdomen is soft and nontender with no organomegaly, hernias, masses. GU: Deferred  Extremities:  No cyanosis, clubbing. Neurologic exam: Balance, Rhomberg, finger to nose testing could not be completed due to clinical state Skin: Warm & dry w/o tenting.  See clinical summary under each active problem in the Problem List with associated updated therapeutic plan

## 2021-02-12 NOTE — Assessment & Plan Note (Addendum)
01/22/2021 A1c is 7.3% indicating fair control.  High dose Prednisone may exacerbate hyperglycemia. Dr Collene Mares will be contacted concerning recommended weaning schedule.

## 2021-02-12 NOTE — Assessment & Plan Note (Addendum)
Vascular Surgery follow-up 2 weeks post discharge

## 2021-02-13 LAB — PROTHROMBIN GENE MUTATION

## 2021-02-13 NOTE — Assessment & Plan Note (Addendum)
Clinically resolved today I shall contact Dr Collene Mares concerning recommended weaning schedule of Prednisone ( current dose 40 mg qd)

## 2021-02-13 NOTE — Patient Instructions (Signed)
See assessment and plan under each diagnosis in the problem list and acutely for this visit 

## 2021-02-20 ENCOUNTER — Encounter: Payer: Self-pay | Admitting: Adult Health

## 2021-02-20 ENCOUNTER — Non-Acute Institutional Stay (SKILLED_NURSING_FACILITY): Payer: Medicare Other | Admitting: Adult Health

## 2021-02-20 DIAGNOSIS — E1140 Type 2 diabetes mellitus with diabetic neuropathy, unspecified: Secondary | ICD-10-CM | POA: Diagnosis not present

## 2021-02-20 DIAGNOSIS — I10 Essential (primary) hypertension: Secondary | ICD-10-CM

## 2021-02-20 DIAGNOSIS — I82402 Acute embolism and thrombosis of unspecified deep veins of left lower extremity: Secondary | ICD-10-CM | POA: Diagnosis not present

## 2021-02-20 DIAGNOSIS — K56609 Unspecified intestinal obstruction, unspecified as to partial versus complete obstruction: Secondary | ICD-10-CM | POA: Diagnosis not present

## 2021-02-20 NOTE — Progress Notes (Signed)
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  04/02/2020 FINDINGS: Low volume AP portable examination with perhaps minimal bibasilar atelectasis. Mild cardiomegaly. Esophagogastric tube with tip and side port below the diaphragm. IMPRESSION: 1. Low volume AP portable examination with perhaps minimal bibasilar atelectasis. 2. Mild cardiomegaly. Electronically Signed   By: Eddie Candle M.D.   On: 01/23/2021 14:44   DG Abd Portable 1V  Result Date: 01/24/2021 CLINICAL DATA:  Abdominal pain and small-bowel obstruction. EXAM: PORTABLE ABDOMEN - 1 VIEW COMPARISON:  01/23/2021 and prior studies FINDINGS: NG tube again noted with tip overlying the mid stomach. Contrast and gas-filled small bowel loops are noted, none which appear distended now. No dilated bowel loops are present. IMPRESSION: Contrast and gas-filled small bowel loops without bowel distention. Electronically Signed   By: Margarette Canada M.D.   On: 01/24/2021 09:12   DG Abd Portable 1V-Small Bowel Obstruction  Protocol-24 hr delay  Result Date: 01/23/2021 CLINICAL DATA:  Abdominal pain, small-bowel obstruction EXAM: PORTABLE ABDOMEN - 1 VIEW COMPARISON:  01/23/2021 at 6:43 a.m. FINDINGS: Supine frontal views of the abdomen and pelvis are obtained. Enteric catheter tip and side port project over the gastric body. Moderate stool throughout the colon. Distended gas-filled loops of small bowel are seen within the central abdomen, measuring up to 3.4 cm in diameter. The previously administered oral contrast is too dilute to be visualized. No acute bony abnormalities. IMPRESSION: 1. Persisting gaseous distension of small bowel consistent with ileus or obstruction. The previously administered oral contrast is too dilute to be identified on this exam. 2. Moderate stool throughout the colon. Electronically Signed   By: Randa Ngo M.D.   On: 01/23/2021 23:29   DG Abd Portable 1V-Small Bowel Obstruction Protocol-initial, 8 hr delay  Result Date: 01/23/2021 CLINICAL DATA:  Recent small-bowel obstruction EXAM: PORTABLE ABDOMEN - 1 VIEW COMPARISON:  CT abdomen and pelvis January 22, 2021; abdominal radiograph January 22, 2021 FINDINGS: Nasogastric tube tip and side port in stomach. There is diffuse stool throughout the colon. There is oral contrast in the stomach; oral contrast seen beyond the stomach. There is intravenous contrast in the urinary bladder. There is currently no appreciable bowel dilatation or air-fluid level to suggest bowel obstruction. No free air. Scattered calcifications noted on the left which may have vascular etiology. Visualized lung bases clear. IMPRESSION: Nasogastric tube tip and side port in stomach. No contrast beyond the stomach. Question a degree of ileus. Diffuse stool throughout colon. No bowel dilatation or air-fluid level to suggest bowel obstruction by radiography.  No free air evident. Electronically Signed   By: Lowella Grip III M.D.   On: 01/23/2021 08:24   VAS [place] LOWER EXTREMITY  VENOUS (DVT) ([facility] and [facility] 7a-7p)  Result Date: 02/04/2021  Lower Venous DVT Study Indications: Pain, and Swelling.  Risk Factors: None identified. Comparison Study: Previous 8/21 Neg Performing Technologist: Vonzell Schlatter RVT  Examination Guidelines: A complete evaluation includes B-mode imaging, spectral Doppler, color Doppler, and power Doppler as needed of all accessible portions of each vessel. Bilateral testing is considered an integral part of a complete examination. Limited examinations for reoccurring indications may be performed as noted. The reflux portion of the exam is performed with the patient in reverse Trendelenburg.  +---------+---------------+---------+-----------+----------+--------------+ LEFT     CompressibilityPhasicitySpontaneityPropertiesThrombus Aging +---------+---------------+---------+-----------+----------+--------------+ CFV      None                                         Acute          +---------+---------------+---------+-----------+----------+--------------+ SFJ      None                                         Acute          +---------+---------------+---------+-----------+----------+--------------+ FV Prox  None                                         Acute          +---------+---------------+---------+-----------+----------+--------------+ FV Mid   None                                         Acute          +---------+---------------+---------+-----------+----------+--------------+ FV DistalNone                                         Acute          +---------+---------------+---------+-----------+----------+--------------+ PFV      None                                         Acute          +---------+---------------+---------+-----------+----------+--------------+ POP      None                                         Acute          +---------+---------------+---------+-----------+----------+--------------+ PTV      None                                          Acute          +---------+---------------+---------+-----------+----------+--------------+ PERO     None  Acute          +---------+---------------+---------+-----------+----------+--------------+  Summary: LEFT: - Findings consistent with acute deep vein thrombosis involving the left common femoral vein, SF junction, left femoral vein, left proximal profunda vein, left popliteal vein, left posterior tibial veins, and left peroneal veins.  *See table(s) above for measurements and observations. Electronically signed by Harold Barban MD on 02/04/2021 at 9:58:10 PM.    Final     Assessment/Plan  1. Uncontrolled hypertension -   BPs elevated -   Increase Amlodipine from 5 mg to 10 mg daily -   Monitor BPs  2. SBO (small bowel obstruction) (HCC) - start Miralax 17 gm daily  3. Type 2 diabetes mellitus with diabetic neuropathy, without long-term current use of insulin (HCC) Lab Results  Component Value Date   HGBA1C 7.3 (H) 01/22/2021   -  Continue Metformin 1,000 mg daily and CBG checks  4. Acute deep vein thrombosis (DVT) of left lower extremity, unspecified vein (HCC) -  Continue Eliquis and Ted stockings, thigh high     Family/ staff Communication:   Discussed plan of care with resident and charge nurse.  Labs/tests ordered:  None  Goals of care:   Short-term care   Durenda Age, DNP, MSN, FNP-BC [facility] [phone] (Monday-Friday 8:00 a.m. - 5:00 p.m.) [phone] (after hours)

## 2021-02-23 DIAGNOSIS — Z79899 Other long term (current) drug therapy: Secondary | ICD-10-CM | POA: Diagnosis not present

## 2021-02-23 DIAGNOSIS — K760 Fatty (change of) liver, not elsewhere classified: Secondary | ICD-10-CM | POA: Diagnosis not present

## 2021-02-23 DIAGNOSIS — M1990 Unspecified osteoarthritis, unspecified site: Secondary | ICD-10-CM | POA: Diagnosis not present

## 2021-02-23 DIAGNOSIS — M7989 Other specified soft tissue disorders: Secondary | ICD-10-CM | POA: Diagnosis not present

## 2021-02-23 DIAGNOSIS — M79643 Pain in unspecified hand: Secondary | ICD-10-CM | POA: Diagnosis not present

## 2021-02-23 DIAGNOSIS — M0579 Rheumatoid arthritis with rheumatoid factor of multiple sites without organ or systems involvement: Secondary | ICD-10-CM | POA: Diagnosis not present

## 2021-02-23 DIAGNOSIS — R252 Cramp and spasm: Secondary | ICD-10-CM | POA: Diagnosis not present

## 2021-02-23 DIAGNOSIS — M349 Systemic sclerosis, unspecified: Secondary | ICD-10-CM | POA: Diagnosis not present

## 2021-02-25 ENCOUNTER — Non-Acute Institutional Stay (SKILLED_NURSING_FACILITY): Payer: Medicare Other | Admitting: Adult Health

## 2021-02-25 ENCOUNTER — Encounter: Payer: Self-pay | Admitting: Adult Health

## 2021-02-25 DIAGNOSIS — E1140 Type 2 diabetes mellitus with diabetic neuropathy, unspecified: Secondary | ICD-10-CM | POA: Diagnosis not present

## 2021-02-25 DIAGNOSIS — K56609 Unspecified intestinal obstruction, unspecified as to partial versus complete obstruction: Secondary | ICD-10-CM | POA: Diagnosis not present

## 2021-02-25 DIAGNOSIS — M05741 Rheumatoid arthritis with rheumatoid factor of right hand without organ or systems involvement: Secondary | ICD-10-CM

## 2021-02-25 DIAGNOSIS — E785 Hyperlipidemia, unspecified: Secondary | ICD-10-CM | POA: Diagnosis not present

## 2021-02-25 DIAGNOSIS — I10 Essential (primary) hypertension: Secondary | ICD-10-CM

## 2021-02-25 DIAGNOSIS — I82402 Acute embolism and thrombosis of unspecified deep veins of left lower extremity: Secondary | ICD-10-CM

## 2021-02-25 DIAGNOSIS — M05742 Rheumatoid arthritis with rheumatoid factor of left hand without organ or systems involvement: Secondary | ICD-10-CM

## 2021-02-25 DIAGNOSIS — D649 Anemia, unspecified: Secondary | ICD-10-CM | POA: Diagnosis not present

## 2021-02-25 DIAGNOSIS — G63 Polyneuropathy in diseases classified elsewhere: Secondary | ICD-10-CM | POA: Diagnosis not present

## 2021-02-25 NOTE — Progress Notes (Signed)
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 (HCC) Lab Results  Component Value Date   HGBA1C 7.3 (H) 01/22/2021   - metFORMIN (GLUCOPHAGE) 500 MG tablet; Take 2 tablets (1,000 mg total) by mouth daily.  Dispense: 60 tablet; Refill: 0  4. SBO (small bowel obstruction) (HCC) - polyethylene glycol (MIRALAX / GLYCOLAX) 17 g packet; Take 17 g by mouth daily.  Dispense: 14 each; Refill: 1 - predniSONE (DELTASONE) 10 MG tablet; Take 1 tablet (10 mg total) by mouth daily  with breakfast for 8 days. TAKE 2 TABLETS (20MG) BY MOUTH ONCE DAILY FOR 5 DAYS;TAKE 1 TABLET BY MOUTH ONCE DAILY FOR 5 DAYS;TAKE 1/2 TABLET (5MG) BY MOUTH ONCE DAILY FOR 5 DAYS  Dispense: 6 tablet; Refill: 0  5. Normochromic normocytic anemia Lab Results  Component Value Date   WBC 7.6 02/10/2021   HGB 9.6 (L) 02/10/2021   HCT 29.2 (L) 02/10/2021   MCV 90.1 02/10/2021   PLT 168 02/10/2021    -  02/24/21  hgb 11.3, improved  6. Polyneuropathy associated with underlying disease ([place]) - gabapentin (NEURONTIN) 300 MG capsule; Take 1 capsule (300 mg total) by mouth 2 (two) times daily.  Dispense: 60 capsule; Refill: 0 - DULoxetine (CYMBALTA) 30 MG capsule; Take 1 capsule (30 mg total) by mouth daily.  Dispense: 30 capsule; Refill: 0  7. Primary hypertension - amLODipine (NORVASC) 10 MG tablet; Take 1 tablet (10 mg total) by mouth daily.  Dispense: 30 tablet; Refill: 0  8. Hyperlipidemia, unspecified hyperlipidemia type - rosuvastatin (CRESTOR) 10 MG tablet; Take 1 tablet (10 mg total) by mouth daily.  Dispense: 30 tablet; Refill: 0      I have filled out patient's discharge paperwork and written prescriptions.  Patient will receive home health PT, OT and Aide.  DME provided: 3-in-1  Total discharge time: Greater than 30 minutes Greater than 50% was spent in counseling and coordination of care.   Discharge time involved coordination of the discharge process with social worker, nursing staff and therapy department. Medical justification for home health services/DME verified.    Durenda Age, DNP, MSN, FNP-BC [facility] [phone] (Monday-Friday 8:00 a.m. - 5:00 p.m.) [phone] (after hours)

## 2021-02-26 MED ORDER — FOLIC ACID 1 MG PO TABS: 1.0000 mg | ORAL_TABLET | Freq: Every day | ORAL | 0 refills | Status: DC

## 2021-02-26 MED ORDER — METHOTREXATE 2.5 MG PO TABS: 20.0000 mg | ORAL_TABLET | ORAL | 0 refills | Status: DC

## 2021-02-26 MED ORDER — DULOXETINE HCL 30 MG PO CPEP: 30.0000 mg | ORAL_CAPSULE | Freq: Every day | ORAL | 0 refills | Status: DC

## 2021-02-26 MED ORDER — ELIQUIS 5 MG PO TABS: 5.0000 mg | ORAL_TABLET | Freq: Two times a day (BID) | ORAL | 0 refills | Status: DC

## 2021-02-26 MED ORDER — PREDNISONE 10 MG PO TABS: 10.0000 mg | ORAL_TABLET | Freq: Every day | ORAL | 0 refills | Status: DC

## 2021-02-26 MED ORDER — POLYETHYLENE GLYCOL 3350 17 G PO PACK: 17.0000 g | PACK | Freq: Every day | ORAL | 1 refills | Status: DC

## 2021-02-26 MED ORDER — AMLODIPINE BESYLATE 10 MG PO TABS: 10.0000 mg | ORAL_TABLET | Freq: Every day | ORAL | 0 refills | Status: DC

## 2021-02-26 MED ORDER — ROSUVASTATIN CALCIUM 10 MG PO TABS: 10.0000 mg | ORAL_TABLET | Freq: Every day | ORAL | 0 refills | Status: AC

## 2021-02-26 MED ORDER — GABAPENTIN 300 MG PO CAPS: 300.0000 mg | ORAL_CAPSULE | Freq: Two times a day (BID) | ORAL | 0 refills | Status: DC

## 2021-02-26 MED ORDER — METFORMIN HCL 500 MG PO TABS: 1000.0000 mg | ORAL_TABLET | Freq: Every day | ORAL | 0 refills | Status: DC

## 2021-03-02 ENCOUNTER — Other Ambulatory Visit: Payer: Self-pay

## 2021-03-02 ENCOUNTER — Encounter (HOSPITAL_COMMUNITY): Payer: Self-pay

## 2021-03-02 ENCOUNTER — Emergency Department (HOSPITAL_COMMUNITY)
Admission: EM | Admit: 2021-03-02 | Discharge: 2021-03-03 | Disposition: A | Discharge status: Home / Self Care | Payer: Medicare Other | Attending: Emergency Medicine | Admitting: Emergency Medicine

## 2021-03-02 DIAGNOSIS — M79604 Pain in right leg: Secondary | ICD-10-CM | POA: Insufficient documentation

## 2021-03-02 DIAGNOSIS — L03115 Cellulitis of right lower limb: Secondary | ICD-10-CM | POA: Diagnosis not present

## 2021-03-02 DIAGNOSIS — I10 Essential (primary) hypertension: Secondary | ICD-10-CM | POA: Diagnosis not present

## 2021-03-02 DIAGNOSIS — M069 Rheumatoid arthritis, unspecified: Secondary | ICD-10-CM | POA: Diagnosis not present

## 2021-03-02 DIAGNOSIS — M7989 Other specified soft tissue disorders: Secondary | ICD-10-CM | POA: Diagnosis not present

## 2021-03-02 DIAGNOSIS — Z7901 Long term (current) use of anticoagulants: Secondary | ICD-10-CM | POA: Diagnosis not present

## 2021-03-02 DIAGNOSIS — Z9181 History of falling: Secondary | ICD-10-CM | POA: Diagnosis not present

## 2021-03-02 DIAGNOSIS — G3184 Mild cognitive impairment, so stated: Secondary | ICD-10-CM | POA: Diagnosis not present

## 2021-03-02 DIAGNOSIS — I82812 Embolism and thrombosis of superficial veins of left lower extremities: Secondary | ICD-10-CM | POA: Diagnosis not present

## 2021-03-02 DIAGNOSIS — Z96611 Presence of right artificial shoulder joint: Secondary | ICD-10-CM | POA: Insufficient documentation

## 2021-03-02 DIAGNOSIS — Z79899 Other long term (current) drug therapy: Secondary | ICD-10-CM | POA: Insufficient documentation

## 2021-03-02 DIAGNOSIS — I82452 Acute embolism and thrombosis of left peroneal vein: Secondary | ICD-10-CM | POA: Diagnosis not present

## 2021-03-02 DIAGNOSIS — I82402 Acute embolism and thrombosis of unspecified deep veins of left lower extremity: Secondary | ICD-10-CM

## 2021-03-02 DIAGNOSIS — F32A Depression, unspecified: Secondary | ICD-10-CM | POA: Diagnosis not present

## 2021-03-02 DIAGNOSIS — K219 Gastro-esophageal reflux disease without esophagitis: Secondary | ICD-10-CM | POA: Diagnosis not present

## 2021-03-02 DIAGNOSIS — I82412 Acute embolism and thrombosis of left femoral vein: Secondary | ICD-10-CM | POA: Diagnosis not present

## 2021-03-02 DIAGNOSIS — L03116 Cellulitis of left lower limb: Secondary | ICD-10-CM | POA: Diagnosis not present

## 2021-03-02 DIAGNOSIS — K5900 Constipation, unspecified: Secondary | ICD-10-CM | POA: Diagnosis not present

## 2021-03-02 DIAGNOSIS — I82442 Acute embolism and thrombosis of left tibial vein: Secondary | ICD-10-CM | POA: Diagnosis not present

## 2021-03-02 DIAGNOSIS — E1140 Type 2 diabetes mellitus with diabetic neuropathy, unspecified: Secondary | ICD-10-CM | POA: Insufficient documentation

## 2021-03-02 DIAGNOSIS — I82432 Acute embolism and thrombosis of left popliteal vein: Secondary | ICD-10-CM | POA: Diagnosis not present

## 2021-03-02 DIAGNOSIS — Z7984 Long term (current) use of oral hypoglycemic drugs: Secondary | ICD-10-CM | POA: Diagnosis not present

## 2021-03-02 DIAGNOSIS — D649 Anemia, unspecified: Secondary | ICD-10-CM | POA: Diagnosis not present

## 2021-03-02 DIAGNOSIS — M1990 Unspecified osteoarthritis, unspecified site: Secondary | ICD-10-CM | POA: Diagnosis not present

## 2021-03-02 LAB — CBC WITH DIFFERENTIAL/PLATELET
Abs Immature Granulocytes: 0.06 10*3/uL (ref 0.00–0.07)
Basophils Absolute: 0 10*3/uL (ref 0.0–0.1)
Basophils Relative: 0 %
Eosinophils Absolute: 0.1 10*3/uL (ref 0.0–0.5)
Eosinophils Relative: 1 %
HCT: 38.7 % (ref 36.0–46.0)
Hemoglobin: 11.8 g/dL — ABNORMAL LOW (ref 12.0–15.0)
Immature Granulocytes: 1 %
Lymphocytes Relative: 24 %
Lymphs Abs: 1.7 10*3/uL (ref 0.7–4.0)
MCH: 28.8 pg (ref 26.0–34.0)
MCHC: 30.5 g/dL (ref 30.0–36.0)
MCV: 94.4 fL (ref 80.0–100.0)
Monocytes Absolute: 0.8 10*3/uL (ref 0.1–1.0)
Monocytes Relative: 11 %
Neutro Abs: 4.4 10*3/uL (ref 1.7–7.7)
Neutrophils Relative %: 63 %
Platelets: 213 10*3/uL (ref 150–400)
RBC: 4.1 MIL/uL (ref 3.87–5.11)
RDW: 18 % — ABNORMAL HIGH (ref 11.5–15.5)
WBC: 7 10*3/uL (ref 4.0–10.5)
nRBC: 0 % (ref 0.0–0.2)

## 2021-03-02 LAB — COMPREHENSIVE METABOLIC PANEL
ALT: 19 U/L (ref 0–44)
AST: 45 U/L — ABNORMAL HIGH (ref 15–41)
Albumin: 3 g/dL — ABNORMAL LOW (ref 3.5–5.0)
Alkaline Phosphatase: 48 U/L (ref 38–126)
Anion gap: 10 (ref 5–15)
BUN: 9 mg/dL (ref 8–23)
CO2: 25 mmol/L (ref 22–32)
Calcium: 8.4 mg/dL — ABNORMAL LOW (ref 8.9–10.3)
Chloride: 104 mmol/L (ref 98–111)
Creatinine, Ser: 0.51 mg/dL (ref 0.44–1.00)
GFR, Estimated: >60 mL/min (ref >60)
Glucose, Bld: 173 mg/dL — ABNORMAL HIGH (ref 70–99)
Potassium: 4.6 mmol/L (ref 3.5–5.1)
Sodium: 139 mmol/L (ref 135–145)
Total Bilirubin: 1.5 mg/dL — ABNORMAL HIGH (ref 0.3–1.2)
Total Protein: 6 g/dL — ABNORMAL LOW (ref 6.5–8.1)

## 2021-03-02 LAB — I-STAT CHEM 8, ED
BUN: 9 mg/dL (ref 8–23)
Calcium, Ion: 1.04 mmol/L — ABNORMAL LOW (ref 1.15–1.40)
Chloride: 104 mmol/L (ref 98–111)
Creatinine, Ser: 0.4 mg/dL — ABNORMAL LOW (ref 0.44–1.00)
Glucose, Bld: 174 mg/dL — ABNORMAL HIGH (ref 70–99)
HCT: 38 % (ref 36.0–46.0)
Hemoglobin: 12.9 g/dL (ref 12.0–15.0)
Potassium: 4.2 mmol/L (ref 3.5–5.1)
Sodium: 140 mmol/L (ref 135–145)
TCO2: 31 mmol/L (ref 22–32)

## 2021-03-02 LAB — LACTIC ACID, PLASMA
Lactic Acid, Venous: 2.9 mmol/L (ref 0.5–1.9)
Lactic Acid, Venous: 1.2 mmol/L (ref 0.5–1.9)

## 2021-03-02 MED ORDER — VANCOMYCIN HCL 1500 MG/300ML IV SOLN
1500.0000 mg | Freq: Once | INTRAVENOUS | Status: DC
  Filled 2021-03-02: qty 300

## 2021-03-02 MED ORDER — SODIUM CHLORIDE 0.9 % IV BOLUS
1000.0000 mL | Freq: Once | INTRAVENOUS | Status: AC
  Administered 2021-03-02: 1000 mL via INTRAVENOUS

## 2021-03-02 MED ORDER — CLINDAMYCIN HCL 150 MG PO CAPS: 300.0000 mg | ORAL_CAPSULE | Freq: Once | ORAL | Status: DC

## 2021-03-02 MED ORDER — VANCOMYCIN HCL 1000 MG/200ML IV SOLN
1000.0000 mg | Freq: Once | INTRAVENOUS | Status: AC
  Administered 2021-03-02: 1000 mg via INTRAVENOUS
  Filled 2021-03-02: qty 200

## 2021-03-02 MED ORDER — CLINDAMYCIN HCL 300 MG PO CAPS: 300.0000 mg | ORAL_CAPSULE | Freq: Three times a day (TID) | ORAL | 0 refills | Status: DC

## 2021-03-02 NOTE — ED Triage Notes (Signed)
Patient presents to the ED for swelling and redness to both lower extremities.  Patient states that she was discharge from a rehab facility Thursday and noticed the redness and swelling yesterday.

## 2021-03-02 NOTE — ED Notes (Signed)
Lactic 2.9 told to Darl Householder MD

## 2021-03-02 NOTE — Discharge Instructions (Signed)
Take clindamycin as prescribed.  Follow-up with Dr. Oneida Alar from vascular surgery   Return to ER if you have worse leg pain or swelling or redness or fevers
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
## 2021-03-02 NOTE — ED Provider Notes (Signed)
Emergency Medicine Provider Triage Evaluation Note  Diane [name] 75 y.o. female was evaluated in triage.  Pt complains of bilateral lower extremity swelling, redness.  Reports that she recently was diagnosed with blood clots in her leg.  She is on Eliquis which she states she has been taking.  She has not missed any doses.  She was in [facility] rehab and was discharged on 02/24/21.  She reports when she woke up yesterday, she noted some mild worsening swelling, erythema noted to her legs.  She states that they hurt.  No numbness/weakness.  She reports she is scheduled for a vascular procedure in the next week.  She states her temperature has been slightly higher than her baseline.  Denies any chest pain, difficulty breathing.  Review of Systems  Positive: Leg pain, redness, swelling. Negative: Chest pain, shortness of breath  Physical Exam  BP 134/82   Pulse 70   Temp 98.2 F (36.8 C) (Oral)   Resp 18   Ht 5\' 4"  (1.626 m)   Wt 65.8 kg   SpO2 100%   BMI 24.89 kg/m  Gen:   Awake, no distress   HEENT:  Atraumatic  Resp:  Normal effort  Cardiac:  Normal rate. Doppler pulses bilaterally.  Abd:   Nondistended, nontender  MSK:   Moves extremities without difficulty.  Mild nonpitting edema noted bilateral lower extremities.  She has some erythema noted to the anterior/lateral surface of the right lower extremity as well as the anterior surface of the left lower extremity. Neuro:  Speech clear   Medical Decision Making  Medically screening exam initiated at 3:55 AM.  Appropriate orders placed.  Diane [name] was informed that the remainder of the evaluation will be completed by another provider, this initial triage assessment does not replace that evaluation, and the importance of remaining in the ED until their evaluation is complete.   Clinical Impression  Leg pain   Portions of this note were generated with Dragon dictation software. Dictation errors may occur despite best attempts at  proofreading.    Diane Napoleon, PA-C 03/02/21 1417    Diane Leigh, MD 03/03/21 [phone]

## 2021-03-03 ENCOUNTER — Telehealth: Payer: Self-pay

## 2021-03-03 DIAGNOSIS — F32A Depression, unspecified: Secondary | ICD-10-CM | POA: Diagnosis not present

## 2021-03-03 DIAGNOSIS — I82412 Acute embolism and thrombosis of left femoral vein: Secondary | ICD-10-CM | POA: Diagnosis not present

## 2021-03-03 DIAGNOSIS — Z96611 Presence of right artificial shoulder joint: Secondary | ICD-10-CM | POA: Diagnosis not present

## 2021-03-03 DIAGNOSIS — Z9181 History of falling: Secondary | ICD-10-CM | POA: Diagnosis not present

## 2021-03-03 DIAGNOSIS — I10 Essential (primary) hypertension: Secondary | ICD-10-CM | POA: Diagnosis not present

## 2021-03-03 DIAGNOSIS — K56609 Unspecified intestinal obstruction, unspecified as to partial versus complete obstruction: Secondary | ICD-10-CM

## 2021-03-03 DIAGNOSIS — D649 Anemia, unspecified: Secondary | ICD-10-CM | POA: Diagnosis not present

## 2021-03-03 DIAGNOSIS — E1140 Type 2 diabetes mellitus with diabetic neuropathy, unspecified: Secondary | ICD-10-CM | POA: Diagnosis not present

## 2021-03-03 DIAGNOSIS — Z7901 Long term (current) use of anticoagulants: Secondary | ICD-10-CM | POA: Diagnosis not present

## 2021-03-03 DIAGNOSIS — I82452 Acute embolism and thrombosis of left peroneal vein: Secondary | ICD-10-CM | POA: Diagnosis not present

## 2021-03-03 DIAGNOSIS — K219 Gastro-esophageal reflux disease without esophagitis: Secondary | ICD-10-CM | POA: Diagnosis not present

## 2021-03-03 DIAGNOSIS — I82432 Acute embolism and thrombosis of left popliteal vein: Secondary | ICD-10-CM | POA: Diagnosis not present

## 2021-03-03 DIAGNOSIS — I82442 Acute embolism and thrombosis of left tibial vein: Secondary | ICD-10-CM | POA: Diagnosis not present

## 2021-03-03 DIAGNOSIS — Z7984 Long term (current) use of oral hypoglycemic drugs: Secondary | ICD-10-CM | POA: Diagnosis not present

## 2021-03-03 DIAGNOSIS — M1990 Unspecified osteoarthritis, unspecified site: Secondary | ICD-10-CM | POA: Diagnosis not present

## 2021-03-03 DIAGNOSIS — M069 Rheumatoid arthritis, unspecified: Secondary | ICD-10-CM | POA: Diagnosis not present

## 2021-03-03 DIAGNOSIS — G3184 Mild cognitive impairment, so stated: Secondary | ICD-10-CM | POA: Diagnosis not present

## 2021-03-03 DIAGNOSIS — K5900 Constipation, unspecified: Secondary | ICD-10-CM | POA: Diagnosis not present

## 2021-03-03 DIAGNOSIS — I82812 Embolism and thrombosis of superficial veins of left lower extremities: Secondary | ICD-10-CM | POA: Diagnosis not present

## 2021-03-03 NOTE — Telephone Encounter (Signed)
[facility] need clarification on quantity for prednisone. #6 was sent over. To [facility]

## 2021-03-04 ENCOUNTER — Telehealth: Payer: Self-pay

## 2021-03-04 MED ORDER — PREDNISONE 10 MG PO TABS: 10.0000 mg | ORAL_TABLET | Freq: Every day | ORAL | 0 refills | Status: AC

## 2021-03-04 NOTE — Telephone Encounter (Signed)
[facility] called stating that prednisone prescription was sent in for only 6 tablets. Directions are for more than 6 tablets. Please clarify quantity  Message routed to Durenda Age, NP

## 2021-03-05 DIAGNOSIS — Z7984 Long term (current) use of oral hypoglycemic drugs: Secondary | ICD-10-CM | POA: Diagnosis not present

## 2021-03-05 DIAGNOSIS — Z96611 Presence of right artificial shoulder joint: Secondary | ICD-10-CM | POA: Diagnosis not present

## 2021-03-05 DIAGNOSIS — I82442 Acute embolism and thrombosis of left tibial vein: Secondary | ICD-10-CM | POA: Diagnosis not present

## 2021-03-05 DIAGNOSIS — D649 Anemia, unspecified: Secondary | ICD-10-CM | POA: Diagnosis not present

## 2021-03-05 DIAGNOSIS — K760 Fatty (change of) liver, not elsewhere classified: Secondary | ICD-10-CM | POA: Diagnosis not present

## 2021-03-05 DIAGNOSIS — K5900 Constipation, unspecified: Secondary | ICD-10-CM | POA: Diagnosis not present

## 2021-03-05 DIAGNOSIS — I82432 Acute embolism and thrombosis of left popliteal vein: Secondary | ICD-10-CM | POA: Diagnosis not present

## 2021-03-05 DIAGNOSIS — E1140 Type 2 diabetes mellitus with diabetic neuropathy, unspecified: Secondary | ICD-10-CM | POA: Diagnosis not present

## 2021-03-05 DIAGNOSIS — I82812 Embolism and thrombosis of superficial veins of left lower extremities: Secondary | ICD-10-CM | POA: Diagnosis not present

## 2021-03-05 DIAGNOSIS — I10 Essential (primary) hypertension: Secondary | ICD-10-CM | POA: Diagnosis not present

## 2021-03-05 DIAGNOSIS — M069 Rheumatoid arthritis, unspecified: Secondary | ICD-10-CM | POA: Diagnosis not present

## 2021-03-05 DIAGNOSIS — I82452 Acute embolism and thrombosis of left peroneal vein: Secondary | ICD-10-CM | POA: Diagnosis not present

## 2021-03-05 DIAGNOSIS — K5904 Chronic idiopathic constipation: Secondary | ICD-10-CM | POA: Diagnosis not present

## 2021-03-05 DIAGNOSIS — K5730 Diverticulosis of large intestine without perforation or abscess without bleeding: Secondary | ICD-10-CM | POA: Diagnosis not present

## 2021-03-05 DIAGNOSIS — F32A Depression, unspecified: Secondary | ICD-10-CM | POA: Diagnosis not present

## 2021-03-05 DIAGNOSIS — K219 Gastro-esophageal reflux disease without esophagitis: Secondary | ICD-10-CM | POA: Diagnosis not present

## 2021-03-05 DIAGNOSIS — M1990 Unspecified osteoarthritis, unspecified site: Secondary | ICD-10-CM | POA: Diagnosis not present

## 2021-03-05 DIAGNOSIS — Z7901 Long term (current) use of anticoagulants: Secondary | ICD-10-CM | POA: Diagnosis not present

## 2021-03-05 DIAGNOSIS — Z9181 History of falling: Secondary | ICD-10-CM | POA: Diagnosis not present

## 2021-03-05 DIAGNOSIS — G3184 Mild cognitive impairment, so stated: Secondary | ICD-10-CM | POA: Diagnosis not present

## 2021-03-05 DIAGNOSIS — I82412 Acute embolism and thrombosis of left femoral vein: Secondary | ICD-10-CM | POA: Diagnosis not present

## 2021-03-05 DIAGNOSIS — R1031 Right lower quadrant pain: Secondary | ICD-10-CM | POA: Diagnosis not present

## 2021-03-05 NOTE — Telephone Encounter (Signed)
Spoke with pharmacist and clarified Prednisone quantity and directions.

## 2021-03-07 LAB — CULTURE, BLOOD (ROUTINE X 2)
Culture: NO GROWTH
Special Requests: ADEQUATE

## 2021-03-11 DIAGNOSIS — Z7901 Long term (current) use of anticoagulants: Secondary | ICD-10-CM | POA: Diagnosis not present

## 2021-03-11 DIAGNOSIS — I82412 Acute embolism and thrombosis of left femoral vein: Secondary | ICD-10-CM | POA: Diagnosis not present

## 2021-03-11 DIAGNOSIS — G3184 Mild cognitive impairment, so stated: Secondary | ICD-10-CM | POA: Diagnosis not present

## 2021-03-11 DIAGNOSIS — I82442 Acute embolism and thrombosis of left tibial vein: Secondary | ICD-10-CM | POA: Diagnosis not present

## 2021-03-11 DIAGNOSIS — K5900 Constipation, unspecified: Secondary | ICD-10-CM | POA: Diagnosis not present

## 2021-03-11 DIAGNOSIS — M069 Rheumatoid arthritis, unspecified: Secondary | ICD-10-CM | POA: Diagnosis not present

## 2021-03-11 DIAGNOSIS — I10 Essential (primary) hypertension: Secondary | ICD-10-CM | POA: Diagnosis not present

## 2021-03-11 DIAGNOSIS — M1990 Unspecified osteoarthritis, unspecified site: Secondary | ICD-10-CM | POA: Diagnosis not present

## 2021-03-11 DIAGNOSIS — Z96611 Presence of right artificial shoulder joint: Secondary | ICD-10-CM | POA: Diagnosis not present

## 2021-03-11 DIAGNOSIS — I82432 Acute embolism and thrombosis of left popliteal vein: Secondary | ICD-10-CM | POA: Diagnosis not present

## 2021-03-11 DIAGNOSIS — Z7984 Long term (current) use of oral hypoglycemic drugs: Secondary | ICD-10-CM | POA: Diagnosis not present

## 2021-03-11 DIAGNOSIS — E1140 Type 2 diabetes mellitus with diabetic neuropathy, unspecified: Secondary | ICD-10-CM | POA: Diagnosis not present

## 2021-03-11 DIAGNOSIS — Z9181 History of falling: Secondary | ICD-10-CM | POA: Diagnosis not present

## 2021-03-11 DIAGNOSIS — D649 Anemia, unspecified: Secondary | ICD-10-CM | POA: Diagnosis not present

## 2021-03-11 DIAGNOSIS — I82812 Embolism and thrombosis of superficial veins of left lower extremities: Secondary | ICD-10-CM | POA: Diagnosis not present

## 2021-03-11 DIAGNOSIS — F32A Depression, unspecified: Secondary | ICD-10-CM | POA: Diagnosis not present

## 2021-03-11 DIAGNOSIS — I82452 Acute embolism and thrombosis of left peroneal vein: Secondary | ICD-10-CM | POA: Diagnosis not present

## 2021-03-11 DIAGNOSIS — K219 Gastro-esophageal reflux disease without esophagitis: Secondary | ICD-10-CM | POA: Diagnosis not present

## 2021-03-12 ENCOUNTER — Other Ambulatory Visit: Payer: Self-pay

## 2021-03-12 ENCOUNTER — Ambulatory Visit (INDEPENDENT_AMBULATORY_CARE_PROVIDER_SITE_OTHER): Payer: Medicare Other | Admitting: Vascular Surgery

## 2021-03-12 ENCOUNTER — Ambulatory Visit (HOSPITAL_COMMUNITY)
Admission: RE | Admit: 2021-03-12 | Discharge: 2021-03-12 | Disposition: A | Discharge status: Home / Self Care | Payer: Medicare Other | Source: Ambulatory Visit | Attending: Vascular Surgery | Admitting: Vascular Surgery

## 2021-03-12 ENCOUNTER — Encounter: Payer: Self-pay | Admitting: Vascular Surgery

## 2021-03-12 VITALS — BP 153/84 | HR 87 | Temp 98.3°F | Resp 20 | Ht 68.0 in | Wt 213.0 lb

## 2021-03-12 DIAGNOSIS — I82402 Acute embolism and thrombosis of unspecified deep veins of left lower extremity: Secondary | ICD-10-CM | POA: Insufficient documentation
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
## 2021-03-17 DIAGNOSIS — I82412 Acute embolism and thrombosis of left femoral vein: Secondary | ICD-10-CM | POA: Diagnosis not present

## 2021-03-17 DIAGNOSIS — K5900 Constipation, unspecified: Secondary | ICD-10-CM | POA: Diagnosis not present

## 2021-03-17 DIAGNOSIS — I82812 Embolism and thrombosis of superficial veins of left lower extremities: Secondary | ICD-10-CM | POA: Diagnosis not present

## 2021-03-17 DIAGNOSIS — Z96611 Presence of right artificial shoulder joint: Secondary | ICD-10-CM | POA: Diagnosis not present

## 2021-03-17 DIAGNOSIS — G3184 Mild cognitive impairment, so stated: Secondary | ICD-10-CM | POA: Diagnosis not present

## 2021-03-17 DIAGNOSIS — E1140 Type 2 diabetes mellitus with diabetic neuropathy, unspecified: Secondary | ICD-10-CM | POA: Diagnosis not present

## 2021-03-17 DIAGNOSIS — I82442 Acute embolism and thrombosis of left tibial vein: Secondary | ICD-10-CM | POA: Diagnosis not present

## 2021-03-17 DIAGNOSIS — F32A Depression, unspecified: Secondary | ICD-10-CM | POA: Diagnosis not present

## 2021-03-17 DIAGNOSIS — M1990 Unspecified osteoarthritis, unspecified site: Secondary | ICD-10-CM | POA: Diagnosis not present

## 2021-03-17 DIAGNOSIS — I82452 Acute embolism and thrombosis of left peroneal vein: Secondary | ICD-10-CM | POA: Diagnosis not present

## 2021-03-17 DIAGNOSIS — K219 Gastro-esophageal reflux disease without esophagitis: Secondary | ICD-10-CM | POA: Diagnosis not present

## 2021-03-17 DIAGNOSIS — Z9181 History of falling: Secondary | ICD-10-CM | POA: Diagnosis not present

## 2021-03-17 DIAGNOSIS — Z7901 Long term (current) use of anticoagulants: Secondary | ICD-10-CM | POA: Diagnosis not present

## 2021-03-17 DIAGNOSIS — Z7984 Long term (current) use of oral hypoglycemic drugs: Secondary | ICD-10-CM | POA: Diagnosis not present

## 2021-03-17 DIAGNOSIS — I82432 Acute embolism and thrombosis of left popliteal vein: Secondary | ICD-10-CM | POA: Diagnosis not present

## 2021-03-17 DIAGNOSIS — I10 Essential (primary) hypertension: Secondary | ICD-10-CM | POA: Diagnosis not present

## 2021-03-17 DIAGNOSIS — M069 Rheumatoid arthritis, unspecified: Secondary | ICD-10-CM | POA: Diagnosis not present

## 2021-03-17 DIAGNOSIS — D649 Anemia, unspecified: Secondary | ICD-10-CM | POA: Diagnosis not present

## 2021-03-20 DIAGNOSIS — I10 Essential (primary) hypertension: Secondary | ICD-10-CM | POA: Diagnosis not present

## 2021-03-20 DIAGNOSIS — Z9181 History of falling: Secondary | ICD-10-CM | POA: Diagnosis not present

## 2021-03-20 DIAGNOSIS — I82812 Embolism and thrombosis of superficial veins of left lower extremities: Secondary | ICD-10-CM | POA: Diagnosis not present

## 2021-03-20 DIAGNOSIS — K219 Gastro-esophageal reflux disease without esophagitis: Secondary | ICD-10-CM | POA: Diagnosis not present

## 2021-03-20 DIAGNOSIS — I82432 Acute embolism and thrombosis of left popliteal vein: Secondary | ICD-10-CM | POA: Diagnosis not present

## 2021-03-20 DIAGNOSIS — E1140 Type 2 diabetes mellitus with diabetic neuropathy, unspecified: Secondary | ICD-10-CM | POA: Diagnosis not present

## 2021-03-20 DIAGNOSIS — I82442 Acute embolism and thrombosis of left tibial vein: Secondary | ICD-10-CM | POA: Diagnosis not present

## 2021-03-20 DIAGNOSIS — G3184 Mild cognitive impairment, so stated: Secondary | ICD-10-CM | POA: Diagnosis not present

## 2021-03-20 DIAGNOSIS — M069 Rheumatoid arthritis, unspecified: Secondary | ICD-10-CM | POA: Diagnosis not present

## 2021-03-20 DIAGNOSIS — Z7901 Long term (current) use of anticoagulants: Secondary | ICD-10-CM | POA: Diagnosis not present

## 2021-03-20 DIAGNOSIS — Z96611 Presence of right artificial shoulder joint: Secondary | ICD-10-CM | POA: Diagnosis not present

## 2021-03-20 DIAGNOSIS — F32A Depression, unspecified: Secondary | ICD-10-CM | POA: Diagnosis not present

## 2021-03-20 DIAGNOSIS — I82452 Acute embolism and thrombosis of left peroneal vein: Secondary | ICD-10-CM | POA: Diagnosis not present

## 2021-03-20 DIAGNOSIS — I82412 Acute embolism and thrombosis of left femoral vein: Secondary | ICD-10-CM | POA: Diagnosis not present

## 2021-03-20 DIAGNOSIS — Z7984 Long term (current) use of oral hypoglycemic drugs: Secondary | ICD-10-CM | POA: Diagnosis not present

## 2021-03-20 DIAGNOSIS — K5900 Constipation, unspecified: Secondary | ICD-10-CM | POA: Diagnosis not present

## 2021-03-20 DIAGNOSIS — D649 Anemia, unspecified: Secondary | ICD-10-CM | POA: Diagnosis not present

## 2021-03-20 DIAGNOSIS — M1990 Unspecified osteoarthritis, unspecified site: Secondary | ICD-10-CM | POA: Diagnosis not present

## 2021-03-27 DIAGNOSIS — I82412 Acute embolism and thrombosis of left femoral vein: Secondary | ICD-10-CM | POA: Diagnosis not present

## 2021-03-27 DIAGNOSIS — M1990 Unspecified osteoarthritis, unspecified site: Secondary | ICD-10-CM | POA: Diagnosis not present

## 2021-03-27 DIAGNOSIS — Z7901 Long term (current) use of anticoagulants: Secondary | ICD-10-CM | POA: Diagnosis not present

## 2021-03-27 DIAGNOSIS — G3184 Mild cognitive impairment, so stated: Secondary | ICD-10-CM | POA: Diagnosis not present

## 2021-03-27 DIAGNOSIS — D649 Anemia, unspecified: Secondary | ICD-10-CM | POA: Diagnosis not present

## 2021-03-27 DIAGNOSIS — I82452 Acute embolism and thrombosis of left peroneal vein: Secondary | ICD-10-CM | POA: Diagnosis not present

## 2021-03-27 DIAGNOSIS — I82812 Embolism and thrombosis of superficial veins of left lower extremities: Secondary | ICD-10-CM | POA: Diagnosis not present

## 2021-03-27 DIAGNOSIS — Z96611 Presence of right artificial shoulder joint: Secondary | ICD-10-CM | POA: Diagnosis not present

## 2021-03-27 DIAGNOSIS — M069 Rheumatoid arthritis, unspecified: Secondary | ICD-10-CM | POA: Diagnosis not present

## 2021-03-27 DIAGNOSIS — F32A Depression, unspecified: Secondary | ICD-10-CM | POA: Diagnosis not present

## 2021-03-27 DIAGNOSIS — I82432 Acute embolism and thrombosis of left popliteal vein: Secondary | ICD-10-CM | POA: Diagnosis not present

## 2021-03-27 DIAGNOSIS — K219 Gastro-esophageal reflux disease without esophagitis: Secondary | ICD-10-CM | POA: Diagnosis not present

## 2021-03-27 DIAGNOSIS — I82442 Acute embolism and thrombosis of left tibial vein: Secondary | ICD-10-CM | POA: Diagnosis not present

## 2021-03-27 DIAGNOSIS — I10 Essential (primary) hypertension: Secondary | ICD-10-CM | POA: Diagnosis not present

## 2021-03-27 DIAGNOSIS — K5900 Constipation, unspecified: Secondary | ICD-10-CM | POA: Diagnosis not present

## 2021-03-27 DIAGNOSIS — Z9181 History of falling: Secondary | ICD-10-CM | POA: Diagnosis not present

## 2021-03-27 DIAGNOSIS — Z7984 Long term (current) use of oral hypoglycemic drugs: Secondary | ICD-10-CM | POA: Diagnosis not present

## 2021-03-27 DIAGNOSIS — E1140 Type 2 diabetes mellitus with diabetic neuropathy, unspecified: Secondary | ICD-10-CM | POA: Diagnosis not present

## 2021-03-30 ENCOUNTER — Other Ambulatory Visit: Payer: Self-pay | Admitting: Adult Health

## 2021-03-30 DIAGNOSIS — I82402 Acute embolism and thrombosis of unspecified deep veins of left lower extremity: Secondary | ICD-10-CM

## 2021-03-30 DIAGNOSIS — G63 Polyneuropathy in diseases classified elsewhere: Secondary | ICD-10-CM

## 2021-03-31 DIAGNOSIS — Z7901 Long term (current) use of anticoagulants: Secondary | ICD-10-CM | POA: Diagnosis not present

## 2021-03-31 DIAGNOSIS — E1140 Type 2 diabetes mellitus with diabetic neuropathy, unspecified: Secondary | ICD-10-CM | POA: Diagnosis not present

## 2021-03-31 DIAGNOSIS — D649 Anemia, unspecified: Secondary | ICD-10-CM | POA: Diagnosis not present

## 2021-03-31 DIAGNOSIS — Z9181 History of falling: Secondary | ICD-10-CM | POA: Diagnosis not present

## 2021-03-31 DIAGNOSIS — F32A Depression, unspecified: Secondary | ICD-10-CM | POA: Diagnosis not present

## 2021-03-31 DIAGNOSIS — I82432 Acute embolism and thrombosis of left popliteal vein: Secondary | ICD-10-CM | POA: Diagnosis not present

## 2021-03-31 DIAGNOSIS — Z7984 Long term (current) use of oral hypoglycemic drugs: Secondary | ICD-10-CM | POA: Diagnosis not present

## 2021-03-31 DIAGNOSIS — I82412 Acute embolism and thrombosis of left femoral vein: Secondary | ICD-10-CM | POA: Diagnosis not present

## 2021-03-31 DIAGNOSIS — I82452 Acute embolism and thrombosis of left peroneal vein: Secondary | ICD-10-CM | POA: Diagnosis not present

## 2021-03-31 DIAGNOSIS — K5900 Constipation, unspecified: Secondary | ICD-10-CM | POA: Diagnosis not present

## 2021-03-31 DIAGNOSIS — Z96611 Presence of right artificial shoulder joint: Secondary | ICD-10-CM | POA: Diagnosis not present

## 2021-03-31 DIAGNOSIS — I82812 Embolism and thrombosis of superficial veins of left lower extremities: Secondary | ICD-10-CM | POA: Diagnosis not present

## 2021-03-31 DIAGNOSIS — G3184 Mild cognitive impairment, so stated: Secondary | ICD-10-CM | POA: Diagnosis not present

## 2021-03-31 DIAGNOSIS — M069 Rheumatoid arthritis, unspecified: Secondary | ICD-10-CM | POA: Diagnosis not present

## 2021-03-31 DIAGNOSIS — I82442 Acute embolism and thrombosis of left tibial vein: Secondary | ICD-10-CM | POA: Diagnosis not present

## 2021-03-31 DIAGNOSIS — M1990 Unspecified osteoarthritis, unspecified site: Secondary | ICD-10-CM | POA: Diagnosis not present

## 2021-03-31 DIAGNOSIS — K219 Gastro-esophageal reflux disease without esophagitis: Secondary | ICD-10-CM | POA: Diagnosis not present

## 2021-03-31 DIAGNOSIS — I10 Essential (primary) hypertension: Secondary | ICD-10-CM | POA: Diagnosis not present

## 2021-04-01 ENCOUNTER — Other Ambulatory Visit: Payer: Self-pay | Admitting: Adult Health

## 2021-04-01 DIAGNOSIS — E1165 Type 2 diabetes mellitus with hyperglycemia: Secondary | ICD-10-CM | POA: Diagnosis not present

## 2021-04-01 DIAGNOSIS — Z8719 Personal history of other diseases of the digestive system: Secondary | ICD-10-CM | POA: Diagnosis not present

## 2021-04-01 DIAGNOSIS — I82402 Acute embolism and thrombosis of unspecified deep veins of left lower extremity: Secondary | ICD-10-CM

## 2021-04-01 DIAGNOSIS — Z86718 Personal history of other venous thrombosis and embolism: Secondary | ICD-10-CM | POA: Diagnosis not present

## 2021-04-01 DIAGNOSIS — E782 Mixed hyperlipidemia: Secondary | ICD-10-CM | POA: Diagnosis not present

## 2021-04-01 DIAGNOSIS — I10 Essential (primary) hypertension: Secondary | ICD-10-CM | POA: Diagnosis not present

## 2021-04-01 DIAGNOSIS — G63 Polyneuropathy in diseases classified elsewhere: Secondary | ICD-10-CM

## 2021-04-03 DIAGNOSIS — Z9181 History of falling: Secondary | ICD-10-CM | POA: Diagnosis not present

## 2021-04-03 DIAGNOSIS — M1990 Unspecified osteoarthritis, unspecified site: Secondary | ICD-10-CM | POA: Diagnosis not present

## 2021-04-03 DIAGNOSIS — I82452 Acute embolism and thrombosis of left peroneal vein: Secondary | ICD-10-CM | POA: Diagnosis not present

## 2021-04-03 DIAGNOSIS — E1140 Type 2 diabetes mellitus with diabetic neuropathy, unspecified: Secondary | ICD-10-CM | POA: Diagnosis not present

## 2021-04-03 DIAGNOSIS — K5900 Constipation, unspecified: Secondary | ICD-10-CM | POA: Diagnosis not present

## 2021-04-03 DIAGNOSIS — I10 Essential (primary) hypertension: Secondary | ICD-10-CM | POA: Diagnosis not present

## 2021-04-03 DIAGNOSIS — Z96611 Presence of right artificial shoulder joint: Secondary | ICD-10-CM | POA: Diagnosis not present

## 2021-04-03 DIAGNOSIS — D649 Anemia, unspecified: Secondary | ICD-10-CM | POA: Diagnosis not present

## 2021-04-03 DIAGNOSIS — G3184 Mild cognitive impairment, so stated: Secondary | ICD-10-CM | POA: Diagnosis not present

## 2021-04-03 DIAGNOSIS — F32A Depression, unspecified: Secondary | ICD-10-CM | POA: Diagnosis not present

## 2021-04-03 DIAGNOSIS — Z7984 Long term (current) use of oral hypoglycemic drugs: Secondary | ICD-10-CM | POA: Diagnosis not present

## 2021-04-03 DIAGNOSIS — Z7901 Long term (current) use of anticoagulants: Secondary | ICD-10-CM | POA: Diagnosis not present

## 2021-04-03 DIAGNOSIS — I82412 Acute embolism and thrombosis of left femoral vein: Secondary | ICD-10-CM | POA: Diagnosis not present

## 2021-04-03 DIAGNOSIS — I82442 Acute embolism and thrombosis of left tibial vein: Secondary | ICD-10-CM | POA: Diagnosis not present

## 2021-04-03 DIAGNOSIS — M069 Rheumatoid arthritis, unspecified: Secondary | ICD-10-CM | POA: Diagnosis not present

## 2021-04-03 DIAGNOSIS — I82432 Acute embolism and thrombosis of left popliteal vein: Secondary | ICD-10-CM | POA: Diagnosis not present

## 2021-04-03 DIAGNOSIS — I82812 Embolism and thrombosis of superficial veins of left lower extremities: Secondary | ICD-10-CM | POA: Diagnosis not present

## 2021-04-03 DIAGNOSIS — K219 Gastro-esophageal reflux disease without esophagitis: Secondary | ICD-10-CM | POA: Diagnosis not present

## 2021-04-09 DIAGNOSIS — E782 Mixed hyperlipidemia: Secondary | ICD-10-CM | POA: Diagnosis not present

## 2021-04-09 DIAGNOSIS — E1165 Type 2 diabetes mellitus with hyperglycemia: Secondary | ICD-10-CM | POA: Diagnosis not present

## 2021-04-09 DIAGNOSIS — Z86718 Personal history of other venous thrombosis and embolism: Secondary | ICD-10-CM | POA: Diagnosis not present

## 2021-04-14 DIAGNOSIS — F32A Depression, unspecified: Secondary | ICD-10-CM | POA: Diagnosis not present

## 2021-04-14 DIAGNOSIS — G3184 Mild cognitive impairment, so stated: Secondary | ICD-10-CM | POA: Diagnosis not present

## 2021-04-14 DIAGNOSIS — Z96611 Presence of right artificial shoulder joint: Secondary | ICD-10-CM | POA: Diagnosis not present

## 2021-04-14 DIAGNOSIS — E1140 Type 2 diabetes mellitus with diabetic neuropathy, unspecified: Secondary | ICD-10-CM | POA: Diagnosis not present

## 2021-04-14 DIAGNOSIS — K5900 Constipation, unspecified: Secondary | ICD-10-CM | POA: Diagnosis not present

## 2021-04-14 DIAGNOSIS — M1990 Unspecified osteoarthritis, unspecified site: Secondary | ICD-10-CM | POA: Diagnosis not present

## 2021-04-14 DIAGNOSIS — M069 Rheumatoid arthritis, unspecified: Secondary | ICD-10-CM | POA: Diagnosis not present

## 2021-04-14 DIAGNOSIS — I82412 Acute embolism and thrombosis of left femoral vein: Secondary | ICD-10-CM | POA: Diagnosis not present

## 2021-04-14 DIAGNOSIS — I10 Essential (primary) hypertension: Secondary | ICD-10-CM | POA: Diagnosis not present

## 2021-04-14 DIAGNOSIS — I82432 Acute embolism and thrombosis of left popliteal vein: Secondary | ICD-10-CM | POA: Diagnosis not present

## 2021-04-14 DIAGNOSIS — Z9181 History of falling: Secondary | ICD-10-CM | POA: Diagnosis not present

## 2021-04-14 DIAGNOSIS — D649 Anemia, unspecified: Secondary | ICD-10-CM | POA: Diagnosis not present

## 2021-04-14 DIAGNOSIS — I82812 Embolism and thrombosis of superficial veins of left lower extremities: Secondary | ICD-10-CM | POA: Diagnosis not present

## 2021-04-14 DIAGNOSIS — I82452 Acute embolism and thrombosis of left peroneal vein: Secondary | ICD-10-CM | POA: Diagnosis not present

## 2021-04-14 DIAGNOSIS — I82442 Acute embolism and thrombosis of left tibial vein: Secondary | ICD-10-CM | POA: Diagnosis not present

## 2021-04-14 DIAGNOSIS — K219 Gastro-esophageal reflux disease without esophagitis: Secondary | ICD-10-CM | POA: Diagnosis not present

## 2021-04-14 DIAGNOSIS — Z7901 Long term (current) use of anticoagulants: Secondary | ICD-10-CM | POA: Diagnosis not present

## 2021-04-14 DIAGNOSIS — Z7984 Long term (current) use of oral hypoglycemic drugs: Secondary | ICD-10-CM | POA: Diagnosis not present

## 2021-04-15 DIAGNOSIS — L309 Dermatitis, unspecified: Secondary | ICD-10-CM | POA: Diagnosis not present

## 2021-05-03 ENCOUNTER — Other Ambulatory Visit: Payer: Self-pay | Admitting: Adult Health

## 2021-05-03 DIAGNOSIS — M05742 Rheumatoid arthritis with rheumatoid factor of left hand without organ or systems involvement: Secondary | ICD-10-CM

## 2021-05-04 ENCOUNTER — Other Ambulatory Visit: Payer: Self-pay | Admitting: Adult Health

## 2021-05-04 DIAGNOSIS — I10 Essential (primary) hypertension: Secondary | ICD-10-CM

## 2021-05-13 DIAGNOSIS — I10 Essential (primary) hypertension: Secondary | ICD-10-CM | POA: Diagnosis not present

## 2021-05-13 DIAGNOSIS — L304 Erythema intertrigo: Secondary | ICD-10-CM | POA: Diagnosis not present

## 2021-05-20 ENCOUNTER — Other Ambulatory Visit: Payer: Self-pay | Admitting: Adult Health

## 2021-05-20 DIAGNOSIS — I82402 Acute embolism and thrombosis of unspecified deep veins of left lower extremity: Secondary | ICD-10-CM

## 2021-05-22 ENCOUNTER — Other Ambulatory Visit: Payer: Self-pay

## 2021-05-22 ENCOUNTER — Encounter (HOSPITAL_COMMUNITY): Payer: Self-pay

## 2021-05-22 ENCOUNTER — Ambulatory Visit (HOSPITAL_COMMUNITY)
Admission: EM | Admit: 2021-05-22 | Discharge: 2021-05-22 | Disposition: A | Discharge status: Home / Self Care | Payer: Medicare Other | Attending: Urgent Care | Admitting: Urgent Care

## 2021-05-22 ENCOUNTER — Emergency Department (HOSPITAL_COMMUNITY): Payer: Medicare Other

## 2021-05-22 ENCOUNTER — Encounter (HOSPITAL_COMMUNITY): Payer: Self-pay | Admitting: Emergency Medicine

## 2021-05-22 ENCOUNTER — Inpatient Hospital Stay (HOSPITAL_COMMUNITY)
Admission: EM | Admit: 2021-05-22 | Discharge: 2021-05-27 | DRG: 389 | Disposition: A | Discharge status: Home Health | Payer: Medicare Other | Attending: Internal Medicine | Admitting: Internal Medicine

## 2021-05-22 DIAGNOSIS — R112 Nausea with vomiting, unspecified: Secondary | ICD-10-CM | POA: Diagnosis not present

## 2021-05-22 DIAGNOSIS — K449 Diaphragmatic hernia without obstruction or gangrene: Secondary | ICD-10-CM | POA: Diagnosis present

## 2021-05-22 DIAGNOSIS — Z96611 Presence of right artificial shoulder joint: Secondary | ICD-10-CM | POA: Diagnosis not present

## 2021-05-22 DIAGNOSIS — G629 Polyneuropathy, unspecified: Secondary | ICD-10-CM

## 2021-05-22 DIAGNOSIS — Z6831 Body mass index (BMI) 31.0-31.9, adult: Secondary | ICD-10-CM

## 2021-05-22 DIAGNOSIS — E876 Hypokalemia: Secondary | ICD-10-CM | POA: Diagnosis present

## 2021-05-22 DIAGNOSIS — K56600 Partial intestinal obstruction, unspecified as to cause: Secondary | ICD-10-CM | POA: Diagnosis not present

## 2021-05-22 DIAGNOSIS — M069 Rheumatoid arthritis, unspecified: Secondary | ICD-10-CM | POA: Diagnosis not present

## 2021-05-22 DIAGNOSIS — I10 Essential (primary) hypertension: Secondary | ICD-10-CM

## 2021-05-22 DIAGNOSIS — E1140 Type 2 diabetes mellitus with diabetic neuropathy, unspecified: Secondary | ICD-10-CM | POA: Diagnosis not present

## 2021-05-22 DIAGNOSIS — Z90710 Acquired absence of both cervix and uterus: Secondary | ICD-10-CM | POA: Diagnosis not present

## 2021-05-22 DIAGNOSIS — R000 Tachycardia, unspecified: Secondary | ICD-10-CM | POA: Diagnosis not present

## 2021-05-22 DIAGNOSIS — K56609 Unspecified intestinal obstruction, unspecified as to partial versus complete obstruction: Secondary | ICD-10-CM | POA: Diagnosis not present

## 2021-05-22 DIAGNOSIS — Z79899 Other long term (current) drug therapy: Secondary | ICD-10-CM | POA: Diagnosis not present

## 2021-05-22 DIAGNOSIS — E1151 Type 2 diabetes mellitus with diabetic peripheral angiopathy without gangrene: Secondary | ICD-10-CM | POA: Diagnosis present

## 2021-05-22 DIAGNOSIS — K760 Fatty (change of) liver, not elsewhere classified: Secondary | ICD-10-CM | POA: Diagnosis present

## 2021-05-22 DIAGNOSIS — Z95828 Presence of other vascular implants and grafts: Secondary | ICD-10-CM | POA: Diagnosis not present

## 2021-05-22 DIAGNOSIS — Z7984 Long term (current) use of oral hypoglycemic drugs: Secondary | ICD-10-CM

## 2021-05-22 DIAGNOSIS — D72828 Other elevated white blood cell count: Secondary | ICD-10-CM | POA: Diagnosis not present

## 2021-05-22 DIAGNOSIS — Z7982 Long term (current) use of aspirin: Secondary | ICD-10-CM

## 2021-05-22 DIAGNOSIS — E872 Acidosis: Secondary | ICD-10-CM | POA: Diagnosis present

## 2021-05-22 DIAGNOSIS — Z0189 Encounter for other specified special examinations: Secondary | ICD-10-CM

## 2021-05-22 DIAGNOSIS — Z86718 Personal history of other venous thrombosis and embolism: Secondary | ICD-10-CM

## 2021-05-22 DIAGNOSIS — R1084 Generalized abdominal pain: Secondary | ICD-10-CM

## 2021-05-22 DIAGNOSIS — E11649 Type 2 diabetes mellitus with hypoglycemia without coma: Secondary | ICD-10-CM | POA: Diagnosis not present

## 2021-05-22 DIAGNOSIS — L988 Other specified disorders of the skin and subcutaneous tissue: Secondary | ICD-10-CM | POA: Diagnosis present

## 2021-05-22 DIAGNOSIS — Z20822 Contact with and (suspected) exposure to covid-19: Secondary | ICD-10-CM | POA: Diagnosis not present

## 2021-05-22 DIAGNOSIS — Z7901 Long term (current) use of anticoagulants: Secondary | ICD-10-CM

## 2021-05-22 DIAGNOSIS — F4321 Adjustment disorder with depressed mood: Secondary | ICD-10-CM | POA: Diagnosis present

## 2021-05-22 DIAGNOSIS — E669 Obesity, unspecified: Secondary | ICD-10-CM | POA: Diagnosis present

## 2021-05-22 DIAGNOSIS — K567 Ileus, unspecified: Secondary | ICD-10-CM | POA: Diagnosis not present

## 2021-05-22 DIAGNOSIS — K219 Gastro-esophageal reflux disease without esophagitis: Secondary | ICD-10-CM | POA: Diagnosis present

## 2021-05-22 DIAGNOSIS — Z9049 Acquired absence of other specified parts of digestive tract: Secondary | ICD-10-CM

## 2021-05-22 DIAGNOSIS — Z4682 Encounter for fitting and adjustment of non-vascular catheter: Secondary | ICD-10-CM | POA: Diagnosis not present

## 2021-05-22 DIAGNOSIS — R109 Unspecified abdominal pain: Secondary | ICD-10-CM | POA: Diagnosis not present

## 2021-05-22 DIAGNOSIS — E119 Type 2 diabetes mellitus without complications: Secondary | ICD-10-CM

## 2021-05-22 DIAGNOSIS — M160 Bilateral primary osteoarthritis of hip: Secondary | ICD-10-CM | POA: Diagnosis not present

## 2021-05-22 DIAGNOSIS — F419 Anxiety disorder, unspecified: Secondary | ICD-10-CM | POA: Diagnosis present

## 2021-05-22 DIAGNOSIS — K6389 Other specified diseases of intestine: Secondary | ICD-10-CM | POA: Diagnosis not present

## 2021-05-22 DIAGNOSIS — K5669 Other partial intestinal obstruction: Secondary | ICD-10-CM | POA: Diagnosis not present

## 2021-05-22 DIAGNOSIS — D8989 Other specified disorders involving the immune mechanism, not elsewhere classified: Secondary | ICD-10-CM | POA: Diagnosis present

## 2021-05-22 DIAGNOSIS — N179 Acute kidney failure, unspecified: Secondary | ICD-10-CM | POA: Diagnosis not present

## 2021-05-22 LAB — RESP PANEL BY RT-PCR (FLU A&B, COVID) ARPGX2
Influenza A by PCR: NEGATIVE
Influenza B by PCR: NEGATIVE
SARS Coronavirus 2 by RT PCR: NEGATIVE

## 2021-05-22 LAB — COMPREHENSIVE METABOLIC PANEL
ALT: 22 U/L (ref 0–44)
AST: 27 U/L (ref 15–41)
Albumin: 4 g/dL (ref 3.5–5.0)
Alkaline Phosphatase: 68 U/L (ref 38–126)
Anion gap: 16 — ABNORMAL HIGH (ref 5–15)
BUN: 10 mg/dL (ref 8–23)
CO2: 19 mmol/L — ABNORMAL LOW (ref 22–32)
Calcium: 9.5 mg/dL (ref 8.9–10.3)
Chloride: 101 mmol/L (ref 98–111)
Creatinine, Ser: 0.7 mg/dL (ref 0.44–1.00)
GFR, Estimated: >60 mL/min (ref >60)
Glucose, Bld: 294 mg/dL — ABNORMAL HIGH (ref 70–99)
Potassium: 3.5 mmol/L (ref 3.5–5.1)
Sodium: 136 mmol/L (ref 135–145)
Total Bilirubin: 0.6 mg/dL (ref 0.3–1.2)
Total Protein: 7.8 g/dL (ref 6.5–8.1)

## 2021-05-22 LAB — GLUCOSE, CAPILLARY: Glucose-Capillary: 182 mg/dL — ABNORMAL HIGH (ref 70–99)

## 2021-05-22 LAB — CBC WITH DIFFERENTIAL/PLATELET
Abs Immature Granulocytes: 0.09 10*3/uL — ABNORMAL HIGH (ref 0.00–0.07)
Basophils Absolute: 0 10*3/uL (ref 0.0–0.1)
Basophils Relative: 0 %
Eosinophils Absolute: 0 10*3/uL (ref 0.0–0.5)
Eosinophils Relative: 0 %
HCT: 48.1 % — ABNORMAL HIGH (ref 36.0–46.0)
Hemoglobin: 14.6 g/dL (ref 12.0–15.0)
Immature Granulocytes: 1 %
Lymphocytes Relative: 5 %
Lymphs Abs: 1 10*3/uL (ref 0.7–4.0)
MCH: 26.6 pg (ref 26.0–34.0)
MCHC: 30.4 g/dL (ref 30.0–36.0)
MCV: 87.8 fL (ref 80.0–100.0)
Monocytes Absolute: 1.5 10*3/uL — ABNORMAL HIGH (ref 0.1–1.0)
Monocytes Relative: 8 %
Neutro Abs: 17.1 10*3/uL — ABNORMAL HIGH (ref 1.7–7.7)
Neutrophils Relative %: 86 %
Platelets: 251 10*3/uL (ref 150–400)
RBC: 5.48 MIL/uL — ABNORMAL HIGH (ref 3.87–5.11)
RDW: 15.9 % — ABNORMAL HIGH (ref 11.5–15.5)
WBC: 19.7 10*3/uL — ABNORMAL HIGH (ref 4.0–10.5)
nRBC: 0 % (ref 0.0–0.2)

## 2021-05-22 LAB — CREATININE, SERUM
Creatinine, Ser: 1 mg/dL (ref 0.44–1.00)
GFR, Estimated: 59 mL/min — ABNORMAL LOW (ref >60)

## 2021-05-22 LAB — CBC
HCT: 41.4 % (ref 36.0–46.0)
Hemoglobin: 13.4 g/dL (ref 12.0–15.0)
MCH: 27.2 pg (ref 26.0–34.0)
MCHC: 32.4 g/dL (ref 30.0–36.0)
MCV: 84 fL (ref 80.0–100.0)
Platelets: 251 10*3/uL (ref 150–400)
RBC: 4.93 MIL/uL (ref 3.87–5.11)
RDW: 15.9 % — ABNORMAL HIGH (ref 11.5–15.5)
WBC: 10.9 10*3/uL — ABNORMAL HIGH (ref 4.0–10.5)
nRBC: 0 % (ref 0.0–0.2)

## 2021-05-22 LAB — LACTIC ACID, PLASMA: Lactic Acid, Venous: 5.1 mmol/L (ref 0.5–1.9)

## 2021-05-22 LAB — CBG MONITORING, ED: Glucose-Capillary: 276 mg/dL — ABNORMAL HIGH (ref 70–99)

## 2021-05-22 LAB — LIPASE, BLOOD: Lipase: 27 U/L (ref 11–51)

## 2021-05-22 IMAGING — CT CT ABD-PELV W/ CM
2 of 5 series · 15 of 46 positions shown, 17 images · IV contrast (APPLIED)
Comparison: CT abdomen pelvis 01/22/2021

CLINICAL DATA: Abdominal pain, acute, nonlocalized

EXAM:
CT ABDOMEN AND PELVIS WITH CONTRAST
TECHNIQUE: Multidetector CT imaging of the abdomen and pelvis was performed
using the standard protocol following bolus administration of
intravenous contrast.
CONTRAST:  100mL OMNIPAQUE IOHEXOL 300 MG/ML  SOLN

[Series 3: abdomen 5.0 · axial · 0.89mm/px · z∈[+833,+1218]mm · 12 of 92 slices shown, 14 images]
[im 8/92  soft-tissue]
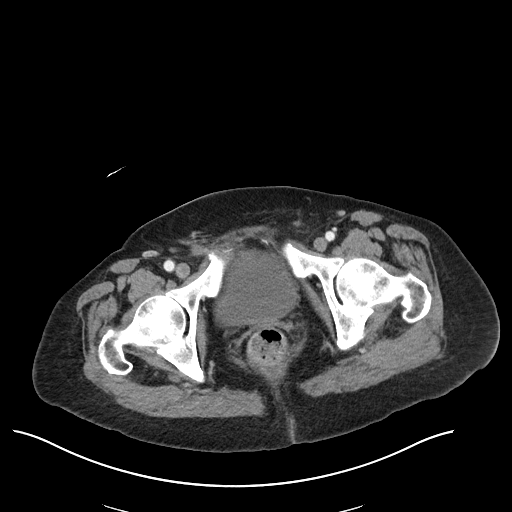
[im 8/92  bone]
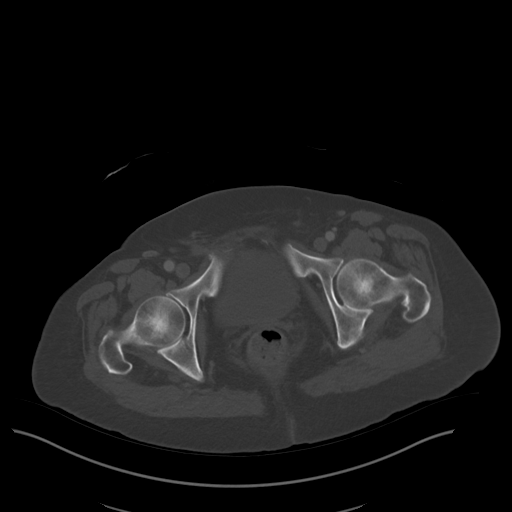
[im 15/92  soft-tissue]
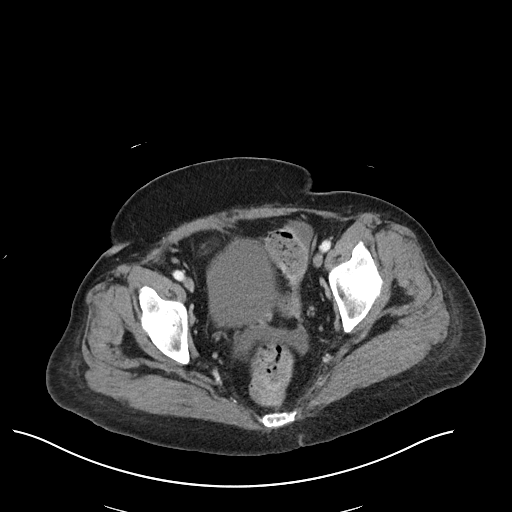
[im 22/92  soft-tissue]
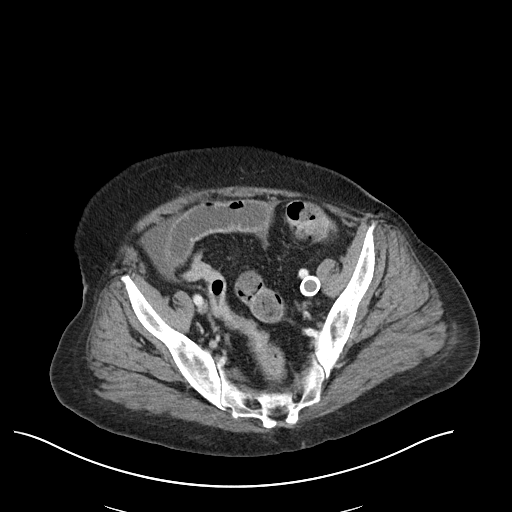
[im 29/92  soft-tissue]
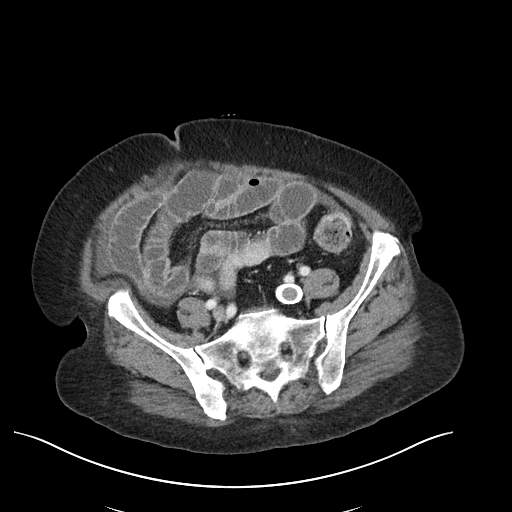
[im 36/92  soft-tissue]
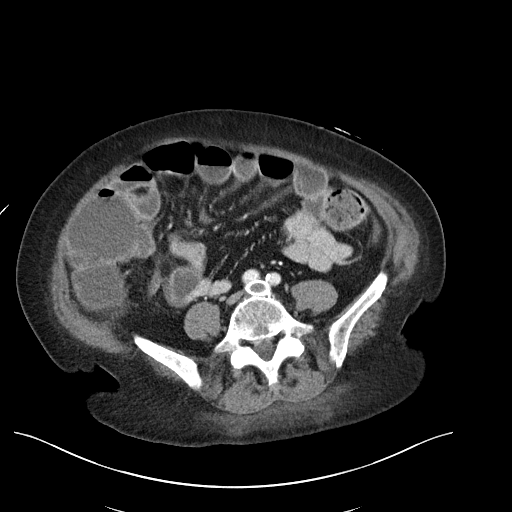
[im 43/92  soft-tissue]
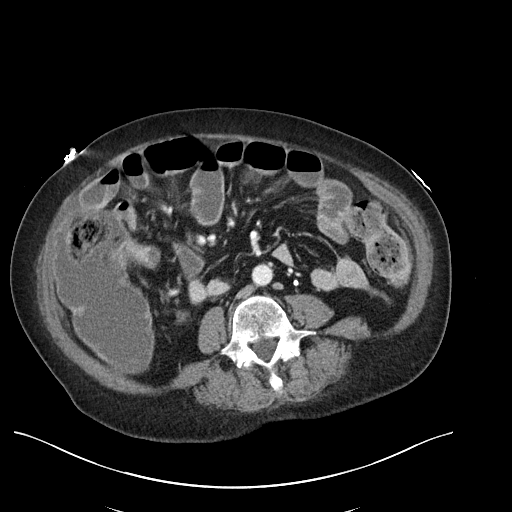
[im 50/92  soft-tissue]
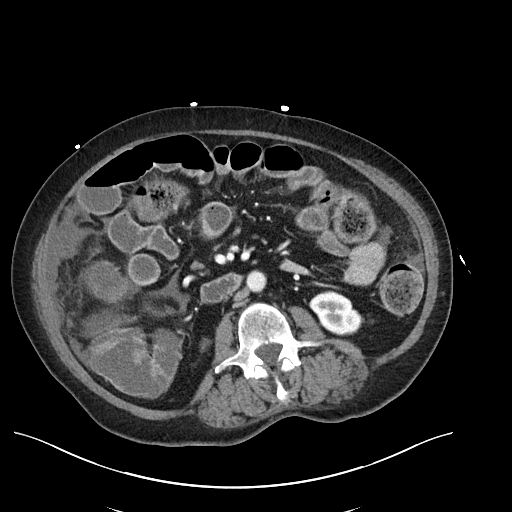
[im 57/92  soft-tissue]
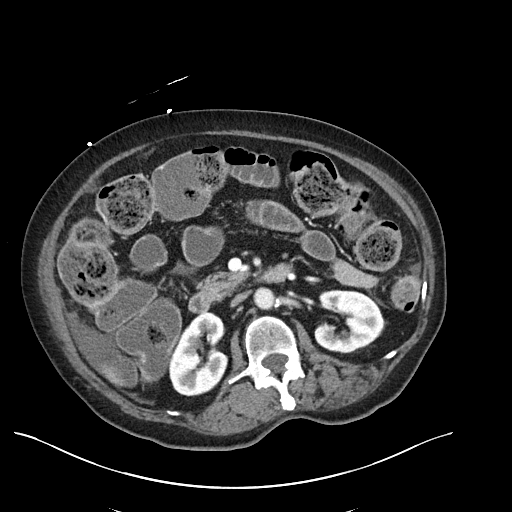
[im 64/92  soft-tissue]
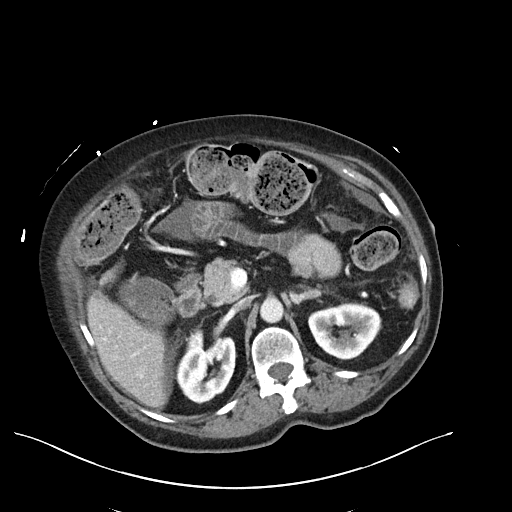
[im 64/92  bone]
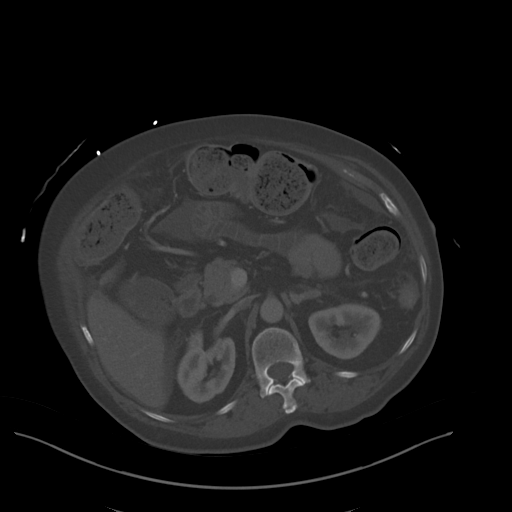
[im 71/92  soft-tissue]
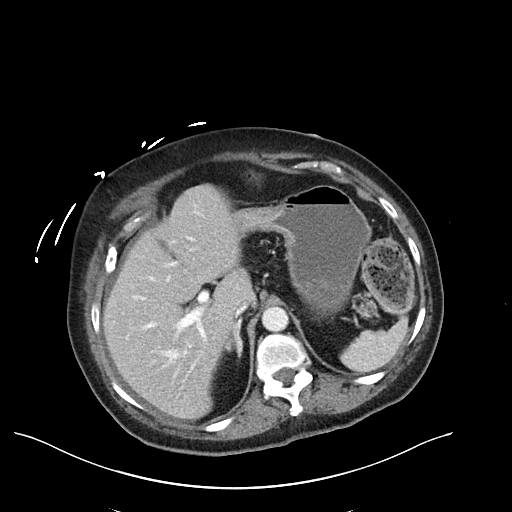
[im 78/92  soft-tissue]
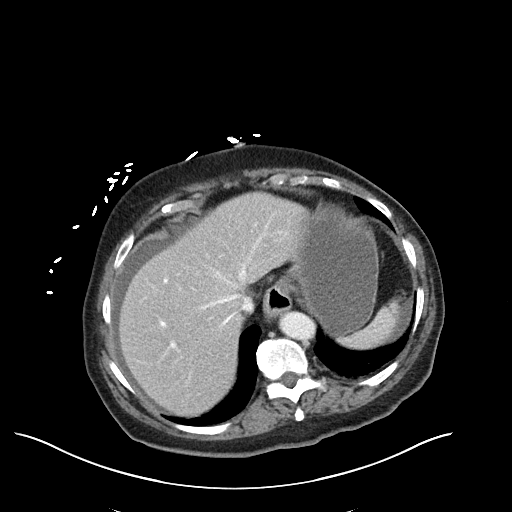
[im 85/92  soft-tissue]
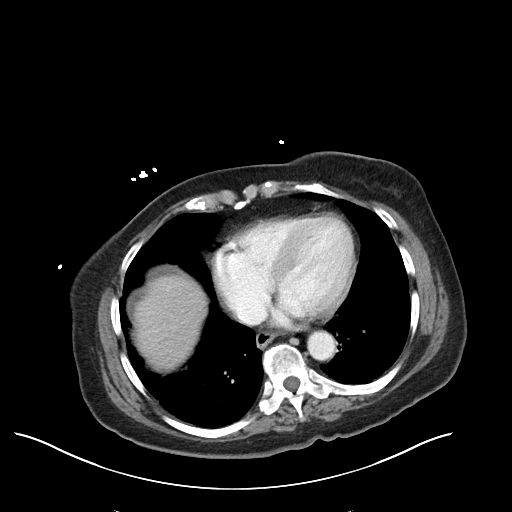

[Series 6: abdomen 3.0 mpr cor · coronal · 0.84mm/px · 3 of 109 slices shown]
[im 37/109  soft-tissue]
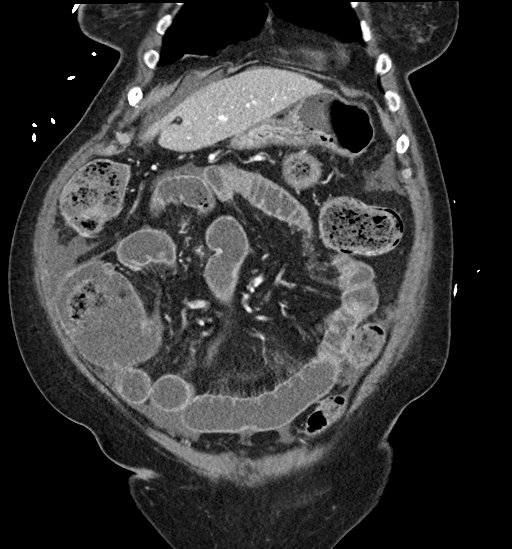
[im 49/109  soft-tissue]
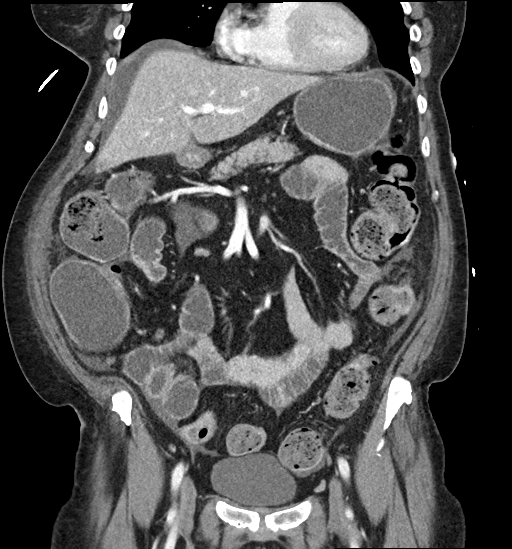
[im 61/109  soft-tissue]
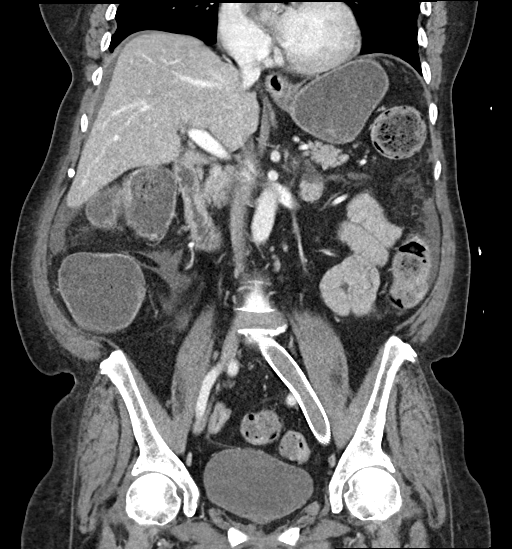

[15 of 46 positions shown; findings below may reference images not displayed]

FINDINGS: Lower chest: Linear atelectasis versus scarring within the right
lower lobe. Similar finding within the left lower lobe. Also right
middle lobe. Coronary artery calcifications. Possible tiny hiatal
hernia.

Hepatobiliary: The hepatic parenchyma is diffusely hypodense
compared to the splenic parenchyma consistent with fatty
infiltration. No focal liver abnormality. Status post
cholecystectomy. No biliary dilatation.

Pancreas: No focal lesion. Normal pancreatic contour. No surrounding
inflammatory changes. No main pancreatic ductal dilatation.

Spleen: Normal in size without focal abnormality.

Adrenals/Urinary Tract:

No adrenal nodule bilaterally.

No nephrolithiasis, no hydronephrosis, and no contour-deforming
renal mass. No ureterolithiasis or hydroureter.

The urinary bladder is unremarkable.

On delayed imaging, there is no urothelial wall thickening and there
are no filling defects in the opacified portions of the bilateral
collecting systems or ureters.

Stomach/Bowel: Stomach is within normal limits. Small bowel
distended with fluid measuring up to 3.4 cm. Fluid distended small
bowel extends to the terminal ileum. Hazy contour of the terminal
ileum/ileocecal valve region with narrowing of the small bowel.
Fluid density within the lumen of the cecum. No definite
pneumatosis. Similar-appearing thickened base of the appendix with
distal appendix appear normal ([DATE]).

Vascular/Lymphatic: Limited evaluation of the left common and
external iliac venous stent. No abdominal aorta or iliac aneurysm.
Mild atherosclerotic plaque of the aorta and its branches. No
abdominal, pelvic, or inguinal lymphadenopathy.

Reproductive: Status post hysterectomy. No adnexal masses.

Other: Interval increase of trace volume ascites. No intraperitoneal
free gas. No organized fluid collection.

Musculoskeletal:

No abdominal wall hernia or abnormality.

No suspicious lytic or blastic osseous lesions. No acute displaced
fracture.
IMPRESSION: 1. Similar-appearing partial small bowel obstruction with fluid
distended small bowel loop extending to the ileocecal valve.
Question ileocecal/base of the appendix mass. Recommend colonoscopy
for further evaluation.
2. Interval increase in trace volumes ascites.
3. Hepatic steatosis.
4. Possible small hiatal hernia.

## 2021-05-22 MED ORDER — ENOXAPARIN SODIUM 40 MG/0.4ML IJ SOSY
40.0000 mg | PREFILLED_SYRINGE | INTRAMUSCULAR | Status: DC
  Administered 2021-05-22: 40 mg via SUBCUTANEOUS
  Filled 2021-05-22: qty 0.4

## 2021-05-22 MED ORDER — MORPHINE SULFATE (PF) 4 MG/ML IV SOLN
4.0000 mg | Freq: Once | INTRAVENOUS | Status: AC
Start: 2021-05-22 — End: 2021-05-22
  Administered 2021-05-22: 4 mg via INTRAVENOUS
  Filled 2021-05-22: qty 1

## 2021-05-22 MED ORDER — INSULIN ASPART 100 UNIT/ML IJ SOLN
0.0000 [IU] | Freq: Three times a day (TID) | INTRAMUSCULAR | Status: DC
  Administered 2021-05-23: 3 [IU] via SUBCUTANEOUS
  Administered 2021-05-23: 2 [IU] via SUBCUTANEOUS

## 2021-05-22 MED ORDER — IOHEXOL 300 MG/ML  SOLN
100.0000 mL | Freq: Once | INTRAMUSCULAR | Status: AC | PRN
  Administered 2021-05-22: 100 mL via INTRAVENOUS

## 2021-05-22 MED ORDER — PROMETHAZINE HCL 25 MG PO TABS: 12.5000 mg | ORAL_TABLET | Freq: Four times a day (QID) | ORAL | Status: DC | PRN

## 2021-05-22 MED ORDER — FLEET ENEMA 7-19 GM/118ML RE ENEM
1.0000 | ENEMA | Freq: Once | RECTAL | Status: AC
  Administered 2021-05-22: 1 via RECTAL
  Filled 2021-05-22 (×2): qty 1

## 2021-05-22 MED ORDER — KETOROLAC TROMETHAMINE 15 MG/ML IJ SOLN
15.0000 mg | Freq: Four times a day (QID) | INTRAMUSCULAR | Status: DC | PRN
  Administered 2021-05-23: 15 mg via INTRAVENOUS
  Filled 2021-05-22: qty 1

## 2021-05-22 MED ORDER — LACTATED RINGERS IV BOLUS
1000.0000 mL | Freq: Once | INTRAVENOUS | Status: AC
  Administered 2021-05-22: 1000 mL via INTRAVENOUS

## 2021-05-22 MED ORDER — LIDOCAINE HCL URETHRAL/MUCOSAL 2 % EX GEL
1.0000 "application " | Freq: Once | CUTANEOUS | Status: AC
  Administered 2021-05-23: 1
  Filled 2021-05-22: qty 6

## 2021-05-22 NOTE — Progress Notes (Signed)
Writer attempted to insert NGT X1, it was unsuccessful. Another RN attempted as well. -also was unsuccessful.  CN was successful putting 16Fr. NGT to R nare. verifiied placement by X-ray. NGT was connected to LIWS.  Call bell within reach and will continue to monitor.
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
## 2021-05-22 NOTE — ED Notes (Signed)
Attempted report 2nd time. Told to call back in 5 minutes

## 2021-05-22 NOTE — ED Triage Notes (Signed)
Throwing up last night, was hospitalized for bowel obstruction 2 months ago. C/o Upper and lower abd pain, LBM: last night. States feeling like she's going to pass out. Pt is a diabetic.
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
## 2021-05-22 NOTE — Progress Notes (Signed)
Dear Doctor: This patient has been identified as a candidate for PICC for the following reason (s): poor vasculature (see previous progress note) If you agree, please write an order for the indicated device.   Thank you for supporting the early vascular access assessment program.

## 2021-05-22 NOTE — ED Provider Notes (Signed)
Emergency Medicine Provider Triage Evaluation Note  Diane [name] , a 75 y.o. female  was evaluated in triage.  Pt  complains of abdominal pain since midnight. Constant, generalized, no alleviating factors. Associated nausea w/ multiple episodes of emesis. Last BM 3AM, has not passed gas since.   S/p hernia repair, D&C, cholecystectomy, hysterectomy  Review of Systems  Positive: Abdominal pain, nausea, vomiting,  Negative: Melena, hematochezia  Physical Exam  BP (!) 162/84   Pulse (!) 103   Temp 99.1 F (37.3 C) (Oral)   Resp 18   Ht '5\' 8"'$  (1.727 m)   Wt 95.3 kg   SpO2 100%   BMI 31.93 kg/m  Gen:   Awake   Resp:  Normal effort  MSK:   Moves extremities without difficulty  Other:  Abdomen: Mild distension, generalized tenderness.   Medical Decision Making  Medically screening exam initiated at 10:51 AM.  Appropriate orders placed.  Diane [name] was informed that the remainder of the evaluation will be completed by another provider, this initial triage assessment does not replace that evaluation, and the importance of remaining in the ED until their evaluation is complete.  CT A/P & labs ordered.   Abdominal pain   Diane Dyke, PA-C 05/22/21 1055    Diane Boss, MD 05/26/21 2257
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
## 2021-05-22 NOTE — ED Notes (Signed)
Attempted report for third time.  Sec notified this RN that she had asked charge RN as well to take report but was told they were still in report.
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
## 2021-05-22 NOTE — Progress Notes (Signed)
This patient has very poor vasculature. Both arms were assessed with [place]. First attempt with [place] IV infiltrated right away. 2nd placed with [place] at [facility]. No other veins noted at this time appropriate for PIV or midline placement. Fran Lowes, RN VAST

## 2021-05-22 NOTE — ED Notes (Signed)
Attempted to call report, left number for call back.

## 2021-05-22 NOTE — ED Triage Notes (Signed)
Pt endorses generalized abd pain since last night. States she had a bowel obstruction earlier this year. Last BM at 3 am today.

## 2021-05-23 ENCOUNTER — Encounter (HOSPITAL_COMMUNITY): Payer: Self-pay | Admitting: Internal Medicine

## 2021-05-23 ENCOUNTER — Inpatient Hospital Stay (HOSPITAL_COMMUNITY): Payer: Medicare Other

## 2021-05-23 DIAGNOSIS — K56609 Unspecified intestinal obstruction, unspecified as to partial versus complete obstruction: Secondary | ICD-10-CM | POA: Diagnosis not present

## 2021-05-23 LAB — CBC
HCT: 39.2 % (ref 36.0–46.0)
Hemoglobin: 12.8 g/dL (ref 12.0–15.0)
MCH: 27 pg (ref 26.0–34.0)
MCHC: 32.7 g/dL (ref 30.0–36.0)
MCV: 82.7 fL (ref 80.0–100.0)
Platelets: 237 10*3/uL (ref 150–400)
RBC: 4.74 MIL/uL (ref 3.87–5.11)
RDW: 15.9 % — ABNORMAL HIGH (ref 11.5–15.5)
WBC: 9.6 10*3/uL (ref 4.0–10.5)
nRBC: 0 % (ref 0.0–0.2)

## 2021-05-23 LAB — COMPREHENSIVE METABOLIC PANEL
ALT: 17 U/L (ref 0–44)
AST: 17 U/L (ref 15–41)
Albumin: 3.4 g/dL — ABNORMAL LOW (ref 3.5–5.0)
Alkaline Phosphatase: 54 U/L (ref 38–126)
Anion gap: 13 (ref 5–15)
BUN: 21 mg/dL (ref 8–23)
CO2: 22 mmol/L (ref 22–32)
Calcium: 8.8 mg/dL — ABNORMAL LOW (ref 8.9–10.3)
Chloride: 104 mmol/L (ref 98–111)
Creatinine, Ser: 1.05 mg/dL — ABNORMAL HIGH (ref 0.44–1.00)
GFR, Estimated: 55 mL/min — ABNORMAL LOW (ref >60)
Glucose, Bld: 205 mg/dL — ABNORMAL HIGH (ref 70–99)
Potassium: 2.9 mmol/L — ABNORMAL LOW (ref 3.5–5.1)
Sodium: 139 mmol/L (ref 135–145)
Total Bilirubin: 0.8 mg/dL (ref 0.3–1.2)
Total Protein: 6.8 g/dL (ref 6.5–8.1)

## 2021-05-23 LAB — APTT: aPTT: 56 seconds — ABNORMAL HIGH (ref 24–36)

## 2021-05-23 LAB — MAGNESIUM: Magnesium: 1.8 mg/dL (ref 1.7–2.4)

## 2021-05-23 LAB — GLUCOSE, CAPILLARY
Glucose-Capillary: 210 mg/dL — ABNORMAL HIGH (ref 70–99)
Glucose-Capillary: 169 mg/dL — ABNORMAL HIGH (ref 70–99)
Glucose-Capillary: 117 mg/dL — ABNORMAL HIGH (ref 70–99)
Glucose-Capillary: 89 mg/dL (ref 70–99)

## 2021-05-23 LAB — LACTIC ACID, PLASMA: Lactic Acid, Venous: 1.6 mmol/L (ref 0.5–1.9)

## 2021-05-23 LAB — HEPARIN LEVEL (UNFRACTIONATED): Heparin Unfractionated: 1.04 IU/mL — ABNORMAL HIGH (ref 0.30–0.70)

## 2021-05-23 IMAGING — DX DG ABD PORTABLE 1V
1 series · 1 of 1 positions shown · non-contrast
Comparison: CT 05/22/2021

CLINICAL DATA: NG tube placement

EXAM:
PORTABLE ABDOMEN - 1 VIEW

[abdomen supine]
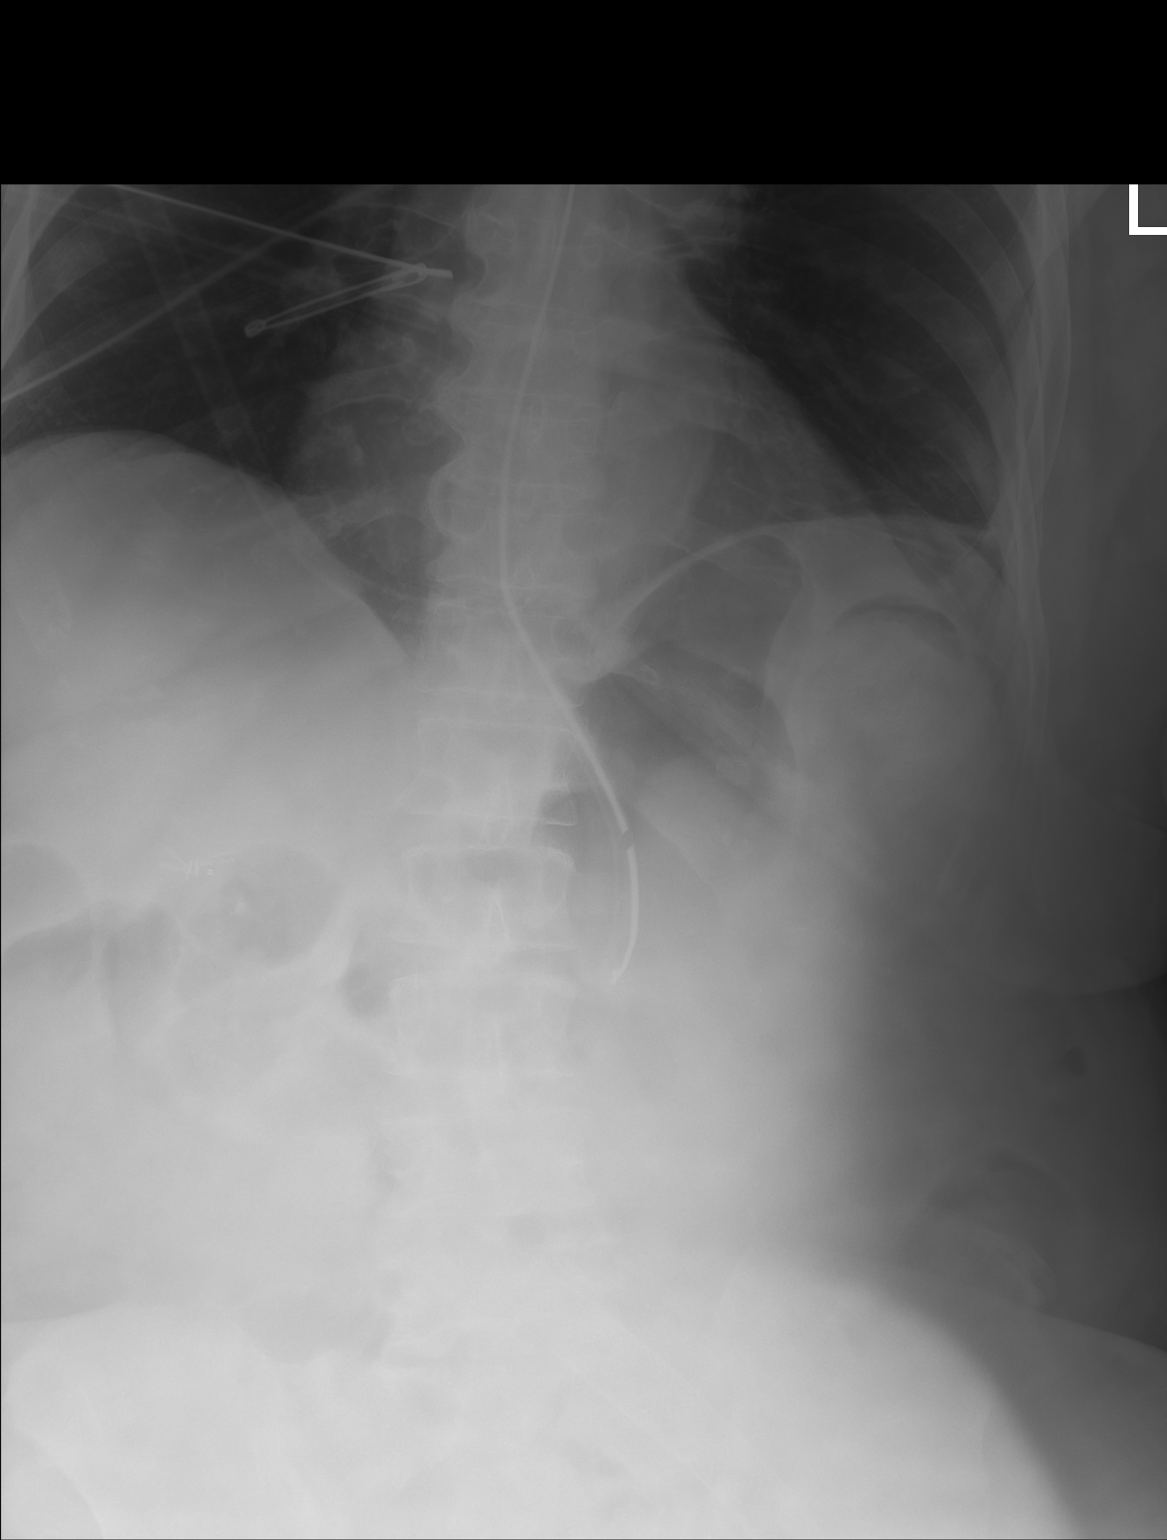

[1 of 1 positions shown; findings below may reference images not displayed]

FINDINGS: Transesophageal tube tip and side port terminate in the left upper
quadrant the region of the gastric body, distal to the GE junction.
Surgical clips again seen in the right upper quadrant. Vascular
stenting of the left iliac is again seen. External support devices
overlie the chest. Lung bases are clear aside from some minimal
atelectasis. Distended upper abdominal bowel is less well visualized
on this radiograph. Please note this is neither a complete view of
the chest or abdomen.
IMPRESSION: Transesophageal tube tip and side port terminate in the region of
the gastric body, distal to the GE junction.

## 2021-05-23 MED ORDER — POTASSIUM CHLORIDE 10 MEQ/100ML IV SOLN
10.0000 meq | Freq: Once | INTRAVENOUS | Status: AC
  Administered 2021-05-23: 10 meq via INTRAVENOUS

## 2021-05-23 MED ORDER — HEPARIN (PORCINE) 25000 UT/250ML-% IV SOLN
1400.0000 [IU]/h | INTRAVENOUS | Status: DC
  Administered 2021-05-23: 1350 [IU]/h via INTRAVENOUS
  Administered 2021-05-24 – 2021-05-25 (×2): 1600 [IU]/h via INTRAVENOUS
  Filled 2021-05-23 (×3): qty 250

## 2021-05-23 MED ORDER — POTASSIUM CHLORIDE 10 MEQ/100ML IV SOLN
10.0000 meq | INTRAVENOUS | Status: AC
  Administered 2021-05-23 (×3): 10 meq via INTRAVENOUS
  Filled 2021-05-23 (×4): qty 100

## 2021-05-23 MED ORDER — POTASSIUM CHLORIDE IN NACL 40-0.9 MEQ/L-% IV SOLN
INTRAVENOUS | Status: DC
  Filled 2021-05-23 (×4): qty 1000

## 2021-05-23 NOTE — Progress Notes (Signed)
During shift, IV team was consulted and attempted to stick patient for a heparin drip site but were unsuccessful.   Checked with pharmacy for compatibility and present IV and heparin are compatible per pharmacy.  Phlebotomy attempted to draw blood for heparin labs and were unsuccessful. An IV team consult was placed.

## 2021-05-23 NOTE — Progress Notes (Signed)
ANTICOAGULATION CONSULT NOTE - Initial Consult  Pharmacy Consult for Heparin IV Indication: DVT hx (PTA eliquis)  Allergies  Allergen Reactions   Cephalexin Other (See Comments)    URINARY RETENTION = "blocked my urine"   Codeine Hives and Rash   Nucynta [Tapentadol Hcl] Other (See Comments)    Altered mental status    Patient Measurements: Height: '5\' 8"'$  (172.7 cm) Weight: 95.3 kg (210 lb) IBW/kg (Calculated) : 63.9 Heparin Dosing Weight: 84.5  Vital Signs: Temp: 98.4 F (36.9 C) (07/23 0929) Temp Source: Oral (07/23 0929) BP: 150/78 (07/23 0929) Pulse Rate: 90 (07/23 0929)  Labs: Recent Labs    05/22/21 1054 05/22/21 2137 05/23/21 0201  HGB 14.6 13.4 12.8  HCT 48.1* 41.4 39.2  PLT 251 251 237  CREATININE 0.70 1.00 1.05*    Estimated Creatinine Clearance: 55.9 mL/min (A) (by C-G formula based on SCr of 1.05 mg/dL (H)).   Medical History: Past Medical History:  Diagnosis Date   Abnormal nuclear stress test 10/18/2013   Arm pain 10/18/2013   Arthritis    Cataract    RIGHT EYE   Chest pain 10/18/2013   Diabetes mellitus    dx over 5 yrs ago   GERD (gastroesophageal reflux disease)    Heart murmur    "yrs ago in [place] -- been here since 1999, "   History of hiatal hernia    Hypertension    Prolonged Q-T interval on ECG 04/20/2017   Pulmonary edema 04/20/2017    Medications:  Scheduled:   enoxaparin (LOVENOX) injection  40 mg Subcutaneous Q24H   insulin aspart  0-9 Units Subcutaneous TID WC    Assessment: 75 yo F with pmh of SBO, DM, GERD, HTN, RA and surgical Hx of cholecystectomy, hysterectomy and D&C with hernia repair. Presented for N/V/abd pain found to have SBO. PTA eliquis for previous hx of DVT/PE. Last dose 7/20. Pharmacy consulted to start IV Heparin.   Goal of Therapy:  Heparin level 0.3-0.7 units/ml Monitor platelets by anticoagulation protocol: Yes   Plan:  Start heparin IV 1350 units/hr (no bolus given) Check aPTT and heparin  level in 6 hours Daily heparin level and CBC   Thank you for allowing pharmacy to participate in this patient's care.  Levonne Spiller, PharmD PGY1 Acute Care Resident  05/23/2021,12:18 PM

## 2021-05-23 NOTE — Progress Notes (Signed)
    CC:  Subjective: Fleets enema recorded last PM 2350 hrs. With small BM.  She is still distended and tender, BS high pitched.  NG is in place and working with minimal output ~150 in the cannister since placement last PM.  She has constipation, BM QOD at home, on stool softner and fiber.  Objective: Vital signs in last 24 hours: Temp:  [98.4 F (36.9 C)-99.2 F (37.3 C)] 98.4 F (36.9 C) (07/23 0516) Pulse Rate:  [96-113] 106 (07/23 0516) Resp:  [17-24] 17 (07/23 0516) BP: (130-174)/(75-101) 137/80 (07/23 0516) SpO2:  [95 %-100 %] 95 % (07/23 0516) Weight:  [95.3 kg] 95.3 kg (07/22 1049) Last BM Date: 05/21/21 (3am according to patient) 1000 IV recorded Urine x2 recorded Emesis 100 recorded BM x1 recorded T-max 99.2 mild tachycardia heart rate in the 100 range.  BP stable K+ 2.9 Glucose 205 Creatinine 1.05 the remainder of the CMP is unremarkable WBC 19.7>> 10.9>> 9.6 Respiratory panel was negative Urinalysis has been ordered but not done yet. Intake/Output from previous day: 07/22 0701 - 07/23 0700 In: 1000.8 [IV Piggyback:1000.8] Out: 100 [Emesis/NG output:100] Intake/Output this shift: No intake/output data recorded.  General appearance: alert, cooperative, and she is still very distended and uncomfortable Resp: clear GI: she is distended, BS high pitched, no flatus.  Lab Results:  Recent Labs    05/22/21 2137 05/23/21 0201  WBC 10.9* 9.6  HGB 13.4 12.8  HCT 41.4 39.2  PLT 251 237    BMET Recent Labs    05/22/21 1054 05/22/21 2137 05/23/21 0201  NA 136  --  139  K 3.5  --  2.9*  CL 101  --  104  CO2 19*  --  22  GLUCOSE 294*  --  205*  BUN 10  --  21  CREATININE 0.70 1.00 1.05*  CALCIUM 9.5  --  8.8*   PT/INR No results for input(s): LABPROT, INR in the last 72 hours.  Recent Labs  Lab 05/22/21 1054 05/23/21 0201  AST 27 17  ALT 22 17  ALKPHOS 68 54  BILITOT 0.6 0.8  PROT 7.8 6.8  ALBUMIN 4.0 3.4*     Lipase     Component  Value Date/Time   LIPASE 27 05/22/2021 1054     Medications:  enoxaparin (LOVENOX) injection  40 mg Subcutaneous Q24H   insulin aspart  0-9 Units Subcutaneous TID WC     Assessment/Plan  Abdominal pain, nausea and vomiting Hx of SBO Hypokalemia   -CT 7/22 small bowel dilatation with large fecal burden throughout the colon; concern for ileocecal valve fullness.   FEN:NPO/ no IV fluids ordered ID: None [id number]  Hypertension Type 2 diabetes with diabetic neuropathy Peripheral neuropathy Autoimmune skin disease Rheumatoid arthritis  PLan: she needs to have her K+ at the 4.0 level, I have ordered a Mag.  I have start her on some IV fluids to help replace K+, and hydration.  I would repeat enema and will order a SSE.  Recheck labs in AM. Continue NG.   LOS: 1 day    Joshawn Crissman 05/23/2021 Please see Amion

## 2021-05-23 NOTE — Hospital Course (Signed)
75 year old black female HTN NIDDM Anxiety depression mild cognitive deficit worsened since husband passing September 2018 Rheumatoid arthritis Plaquenil methotrexate  Acute DVT 02/10/2021 admitted for the same status post thrombolysis,  L common iliac venous stent-Dr. Oneida Alar  Multiple partial small bowel obstructions?  Stricture?  Inflammation followed by Dr. Collene Mares on steroids  Developed sudden onset abdominal pain constant epigastric food colored vomit no flatus early a.m. 05/23/2021 No stool for several days T-max 99 pulse pulse 115 CT shows SBO fluid distended loops extending to the ileocecal valve with steatosis  Potassium 2.9 BUN/creatinine 10/3.7-nephrograms 721/1.0 Lactic acid on admission 5.1    General surgery consulted NG tube placed

## 2021-05-23 NOTE — Progress Notes (Signed)
Patient arrived to [id number], [id number], able to make needs known. VSS BP143/90 PR103 RR18 T99.1, no SOB/DOB noted, 98%room air. Denies any N/V at this moment. Patient was oriented to call bell and surroundings. Call bell within reach and will continue to monitor.

## 2021-05-23 NOTE — Progress Notes (Signed)
[place] for Heparin IV Indication: DVT hx (PTA eliquis)  Allergies  Allergen Reactions   Cephalexin Other (See Comments)    URINARY RETENTION = "blocked my urine"   Codeine Hives and Rash   Nucynta [Tapentadol Hcl] Other (See Comments)    Altered mental status    Patient Measurements: Height: '5\' 8"'$  (172.7 cm) Weight: 95.3 kg (210 lb) IBW/kg (Calculated) : 63.9 Heparin Dosing Weight: 84.5  Vital Signs: Temp: 98.4 F (36.9 C) (07/23 2111) Temp Source: Oral (07/23 2111) BP: 164/74 (07/23 2111) Pulse Rate: 88 (07/23 2111)  Labs: Recent Labs    05/22/21 1054 05/22/21 2137 05/23/21 0201 05/23/21 2039  HGB 14.6 13.4 12.8  --   HCT 48.1* 41.4 39.2  --   PLT 251 251 237  --   APTT  --   --   --  56*  HEPARINUNFRC  --   --   --  1.04*  CREATININE 0.70 1.00 1.05*  --      Estimated Creatinine Clearance: 55.9 mL/min (A) (by C-G formula based on SCr of 1.05 mg/dL (H)).   Medical History: Past Medical History:  Diagnosis Date   Abnormal nuclear stress test 10/18/2013   Arm pain 10/18/2013   Arthritis    Cataract    RIGHT EYE   Chest pain 10/18/2013   Diabetes mellitus    dx over 5 yrs ago   GERD (gastroesophageal reflux disease)    Heart murmur    "yrs ago in [place] -- been here since 1999, "   History of hiatal hernia    Hypertension    Prolonged Q-T interval on ECG 04/20/2017   Pulmonary edema 04/20/2017    Medications:  Scheduled:   insulin aspart  0-9 Units Subcutaneous TID WC    Assessment: 75 yo F with pmh of SBO, DM, GERD, HTN, RA and surgical Hx of cholecystectomy, hysterectomy and D&C with hernia repair. Presented for N/V/abd pain found to have SBO. PTA eliquis for previous hx of DVT/PE. Last dose 7/20. Pharmacy consulted to start IV Heparin.   Initial aPTT subtherapeutic at 56 seconds, Anti-Xa does not appear correlated at 1.04 on 1350 units/hr so will continue to trend  Goal of Therapy:  Heparin level  0.3-0.7 units/ml aPTT 66-102 seconds Monitor platelets by anticoagulation protocol: Yes   Plan:  Increase heparin gtt to 1450 units/hr F/u aPTT/heparin level repeat with AM labs   Bertis Ruddy, PharmD Clinical Pharmacist ED Pharmacist Phone # [phone] 05/23/2021 9:21 PM

## 2021-05-23 NOTE — Progress Notes (Signed)
PROGRESS NOTE   STEPHIE [name]  [id number] DOB: 15-Aug-1946 DOA: 05/22/2021 PCP: [name], MD  Brief Narrative:  75 year old black female HTN NIDDM Anxiety depression mild cognitive deficit worsened since husband passing September 2018 Rheumatoid arthritis Plaquenil methotrexate  Acute DVT 02/10/2021 admitted for the same status post thrombolysis,  L common iliac venous stent-Dr. Oneida Alar  Multiple partial small bowel obstructions?  Stricture?  Inflammation followed by Dr. Collene Mares on steroids  Developed sudden onset abdominal pain constant epigastric food colored vomit no flatus early a.m. 05/23/2021 No stool for several days T-max 99 pulse pulse 115 CT shows SBO fluid distended loops extending to the ileocecal valve with steatosis  Hospital-Problem based course  Partial small bowel obstruction-previous umbilical hernia repair hysterectomy etc. and prior SBO in the past -NG decompression per general surgery - Soapsuds enema, keep potassium above 4 and further management per them -Lactic acidosis, leukocytosis probably because of SBO and no other underlying ischemia or pathology seems to be at play here  Hypokalemia - Replace with saline and 40 of K and reassess  DVT 02/10/2021 status post thrombolysis and left iliac stent - Home apixaban 5 twice daily held - Start cautious heparin lower range of therapeutic per pharmacy - Gabapentin 300 resume when able to take p.o. otherwise hold  Rheumatoid arthritis - On methotrexate 20 mg q. Sunday-on hold at this time  DM TY 2 - Metformin 2000 mg daily held, gabapentin as above when able -Continue sliding scale every 4 coverage-sugars 160s to 210 range  Depression situational - Holding at this time Cymbalta 30    DVT prophylaxis: Heparin GTT Code Status: Full code Family Communication: No present family at this time Disposition:  Status is: Inpatient  Remains inpatient appropriate because:Ongoing active pain requiring  inpatient pain management and Unsafe d/c plan  Dispo: The patient is from: Home              Anticipated d/c is to: Home              Patient currently is not medically stable to d/c.   Difficult to place patient No   Consultants:  In general surgery  Procedures: Scans  Antimicrobials:   None   Subjective: Sitting quietly in bed NG tube in place draining minimally abdomen little bit distended she does not have pain fever chills nausea vomiting chest pain at this time-she is not passing gas but feels relieved with placement of the NG tube  Objective: Vitals:   05/22/21 2041 05/23/21 0008 05/23/21 0516 05/23/21 0929  BP: 139/80 (!) 143/90 137/80 (!) 150/78  Pulse: (!) 109 (!) 103 (!) 106 90  Resp: '18 18 17 20  '$ Temp: 99.2 F (37.3 C) 99.1 F (37.3 C) 98.4 F (36.9 C) 98.4 F (36.9 C)  TempSrc: Oral Oral Oral Oral  SpO2: 98% 98% 95% 98%  Weight:      Height:        Intake/Output Summary (Last 24 hours) at 05/23/2021 1142 Last data filed at 05/23/2021 0900 Gross per 24 hour  Intake 1000.78 ml  Output 250 ml  Net 750.78 ml   Filed Weights   05/22/21 1049  Weight: 95.3 kg    Examination:  Quiet appearing black female no distress NG tube in place no icterus no pallor throat soft supple S1-S2 no murmur Distended abdomen no pain or rebound or guarding bowel sounds not elicited Chest clear no added sound No lower extremity edema  Data Reviewed: personally reviewed   CBC  Component Value Date/Time   WBC 9.6 05/23/2021 0201   RBC 4.74 05/23/2021 0201   HGB 12.8 05/23/2021 0201   HCT 39.2 05/23/2021 0201   PLT 237 05/23/2021 0201   MCV 82.7 05/23/2021 0201   MCH 27.0 05/23/2021 0201   MCHC 32.7 05/23/2021 0201   RDW 15.9 (H) 05/23/2021 0201   LYMPHSABS 1.0 05/22/2021 1054   MONOABS 1.5 (H) 05/22/2021 1054   EOSABS 0.0 05/22/2021 1054   BASOSABS 0.0 05/22/2021 1054   CMP Latest Ref Rng & Units 05/23/2021 05/22/2021 05/22/2021  Glucose 70 - 99 mg/dL 205(H) -  294(H)  BUN 8 - 23 mg/dL 21 - 10  Creatinine 0.44 - 1.00 mg/dL 1.05(H) 1.00 0.70  Sodium 135 - 145 mmol/L 139 - 136  Potassium 3.5 - 5.1 mmol/L 2.9(L) - 3.5  Chloride 98 - 111 mmol/L 104 - 101  CO2 22 - 32 mmol/L 22 - 19(L)  Calcium 8.9 - 10.3 mg/dL 8.8(L) - 9.5  Total Protein 6.5 - 8.1 g/dL 6.8 - 7.8  Total Bilirubin 0.3 - 1.2 mg/dL 0.8 - 0.6  Alkaline Phos 38 - 126 U/L 54 - 68  AST 15 - 41 U/L 17 - 27  ALT 0 - 44 U/L 17 - 22     Radiology Studies: CT Abdomen Pelvis W Contrast  Result Date: 05/22/2021 CLINICAL DATA:  Abdominal pain, acute, nonlocalized EXAM: CT ABDOMEN AND PELVIS WITH CONTRAST TECHNIQUE: Multidetector CT imaging of the abdomen and pelvis was performed using the standard protocol following bolus administration of intravenous contrast. CONTRAST:  14m OMNIPAQUE IOHEXOL 300 MG/ML  SOLN COMPARISON:  CT abdomen pelvis 01/22/2021 FINDINGS: Lower chest: Linear atelectasis versus scarring within the right lower lobe. Similar finding within the left lower lobe. Also right middle lobe. Coronary artery calcifications. Possible tiny hiatal hernia. Hepatobiliary: The hepatic parenchyma is diffusely hypodense compared to the splenic parenchyma consistent with fatty infiltration. No focal liver abnormality. Status post cholecystectomy. No biliary dilatation. Pancreas: No focal lesion. Normal pancreatic contour. No surrounding inflammatory changes. No main pancreatic ductal dilatation. Spleen: Normal in size without focal abnormality. Adrenals/Urinary Tract: No adrenal nodule bilaterally. No nephrolithiasis, no hydronephrosis, and no contour-deforming renal mass. No ureterolithiasis or hydroureter. The urinary bladder is unremarkable. On delayed imaging, there is no urothelial wall thickening and there are no filling defects in the opacified portions of the bilateral collecting systems or ureters. Stomach/Bowel: Stomach is within normal limits. Small bowel distended with fluid measuring up to  3.4 cm. Fluid distended small bowel extends to the terminal ileum. Hazy contour of the terminal ileum/ileocecal valve region with narrowing of the small bowel. Fluid density within the lumen of the cecum. No definite pneumatosis. Similar-appearing thickened base of the appendix with distal appendix appear normal (6:41). Vascular/Lymphatic: Limited evaluation of the left common and external iliac venous stent. No abdominal aorta or iliac aneurysm. Mild atherosclerotic plaque of the aorta and its branches. No abdominal, pelvic, or inguinal lymphadenopathy. Reproductive: Status post hysterectomy. No adnexal masses. Other: Interval increase of trace volume ascites. No intraperitoneal free gas. No organized fluid collection. Musculoskeletal: No abdominal wall hernia or abnormality. No suspicious lytic or blastic osseous lesions. No acute displaced fracture. IMPRESSION: 1. Similar-appearing partial small bowel obstruction with fluid distended small bowel loop extending to the ileocecal valve. Question ileocecal/base of the appendix mass. Recommend colonoscopy for further evaluation. 2. Interval increase in trace volumes ascites. 3. Hepatic steatosis. 4. Possible small hiatal hernia. Electronically Signed   By: MClelia CroftD.  On: 05/22/2021 17:34   DG Abd Portable 1V  Result Date: 05/23/2021 CLINICAL DATA:  NG tube placement EXAM: PORTABLE ABDOMEN - 1 VIEW COMPARISON:  CT 05/22/2021 FINDINGS: Transesophageal tube tip and side port terminate in the left upper quadrant the region of the gastric body, distal to the GE junction. Surgical clips again seen in the right upper quadrant. Vascular stenting of the left iliac is again seen. External support devices overlie the chest. Lung bases are clear aside from some minimal atelectasis. Distended upper abdominal bowel is less well visualized on this radiograph. Please note this is neither a complete view of the chest or abdomen. IMPRESSION: Transesophageal tube tip and  side port terminate in the region of the gastric body, distal to the GE junction. Electronically Signed   By: Lovena Le M.D.   On: 05/23/2021 00:49     Scheduled Meds:  enoxaparin (LOVENOX) injection  40 mg Subcutaneous Q24H   insulin aspart  0-9 Units Subcutaneous TID WC   Continuous Infusions:  0.9 % NaCl with KCl 40 mEq / L 100 mL/hr at 05/23/21 1026   potassium chloride 10 mEq (05/23/21 1032)     LOS: 1 day   Time spent: [place], MD [facility] To contact the attending provider between 7A-7P or the covering provider during after hours 7P-7A, please log into the web site [email or web address] and access using universal [place] password for that web site. If you do not have the password, please call the hospital operator.  05/23/2021, 11:42 AM

## 2021-05-24 ENCOUNTER — Inpatient Hospital Stay (HOSPITAL_COMMUNITY): Payer: Medicare Other

## 2021-05-24 DIAGNOSIS — K56609 Unspecified intestinal obstruction, unspecified as to partial versus complete obstruction: Secondary | ICD-10-CM | POA: Diagnosis not present

## 2021-05-24 LAB — CBC WITH DIFFERENTIAL/PLATELET
Abs Immature Granulocytes: 0.03 10*3/uL (ref 0.00–0.07)
Basophils Absolute: 0 10*3/uL (ref 0.0–0.1)
Basophils Relative: 0 %
Eosinophils Absolute: 0 10*3/uL (ref 0.0–0.5)
Eosinophils Relative: 1 %
HCT: 37.9 % (ref 36.0–46.0)
Hemoglobin: 11.4 g/dL — ABNORMAL LOW (ref 12.0–15.0)
Immature Granulocytes: 0 %
Lymphocytes Relative: 21 %
Lymphs Abs: 1.8 10*3/uL (ref 0.7–4.0)
MCH: 26.1 pg (ref 26.0–34.0)
MCHC: 30.1 g/dL (ref 30.0–36.0)
MCV: 86.9 fL (ref 80.0–100.0)
Monocytes Absolute: 0.9 10*3/uL (ref 0.1–1.0)
Monocytes Relative: 10 %
Neutro Abs: 5.8 10*3/uL (ref 1.7–7.7)
Neutrophils Relative %: 68 %
Platelets: 184 10*3/uL (ref 150–400)
RBC: 4.36 MIL/uL (ref 3.87–5.11)
RDW: 15.9 % — ABNORMAL HIGH (ref 11.5–15.5)
WBC: 8.6 10*3/uL (ref 4.0–10.5)
nRBC: 0 % (ref 0.0–0.2)

## 2021-05-24 LAB — BASIC METABOLIC PANEL
Anion gap: 9 (ref 5–15)
BUN: 21 mg/dL (ref 8–23)
CO2: 21 mmol/L — ABNORMAL LOW (ref 22–32)
Calcium: 8.7 mg/dL — ABNORMAL LOW (ref 8.9–10.3)
Chloride: 111 mmol/L (ref 98–111)
Creatinine, Ser: 0.67 mg/dL (ref 0.44–1.00)
GFR, Estimated: >60 mL/min (ref >60)
Glucose, Bld: 133 mg/dL — ABNORMAL HIGH (ref 70–99)
Potassium: 4.3 mmol/L (ref 3.5–5.1)
Sodium: 141 mmol/L (ref 135–145)

## 2021-05-24 LAB — GLUCOSE, CAPILLARY
Glucose-Capillary: 95 mg/dL (ref 70–99)
Glucose-Capillary: 94 mg/dL (ref 70–99)
Glucose-Capillary: 83 mg/dL (ref 70–99)
Glucose-Capillary: 89 mg/dL (ref 70–99)

## 2021-05-24 LAB — MAGNESIUM: Magnesium: 1.9 mg/dL (ref 1.7–2.4)

## 2021-05-24 LAB — APTT
aPTT: 54 seconds — ABNORMAL HIGH (ref 24–36)
aPTT: 98 seconds — ABNORMAL HIGH (ref 24–36)
aPTT: 76 seconds — ABNORMAL HIGH (ref 24–36)

## 2021-05-24 LAB — HEPARIN LEVEL (UNFRACTIONATED)
Heparin Unfractionated: 0.96 IU/mL — ABNORMAL HIGH (ref 0.30–0.70)
Heparin Unfractionated: 1.08 IU/mL — ABNORMAL HIGH (ref 0.30–0.70)

## 2021-05-24 IMAGING — DX DG ABD PORTABLE 1V
1 series · 1 of 1 positions shown · non-contrast
Comparison: Abdominal radiograph dated 05/23/2021.

CLINICAL DATA: Nasogastric tube placement.

EXAM:
PORTABLE ABDOMEN - 1 VIEW

[abdomen]
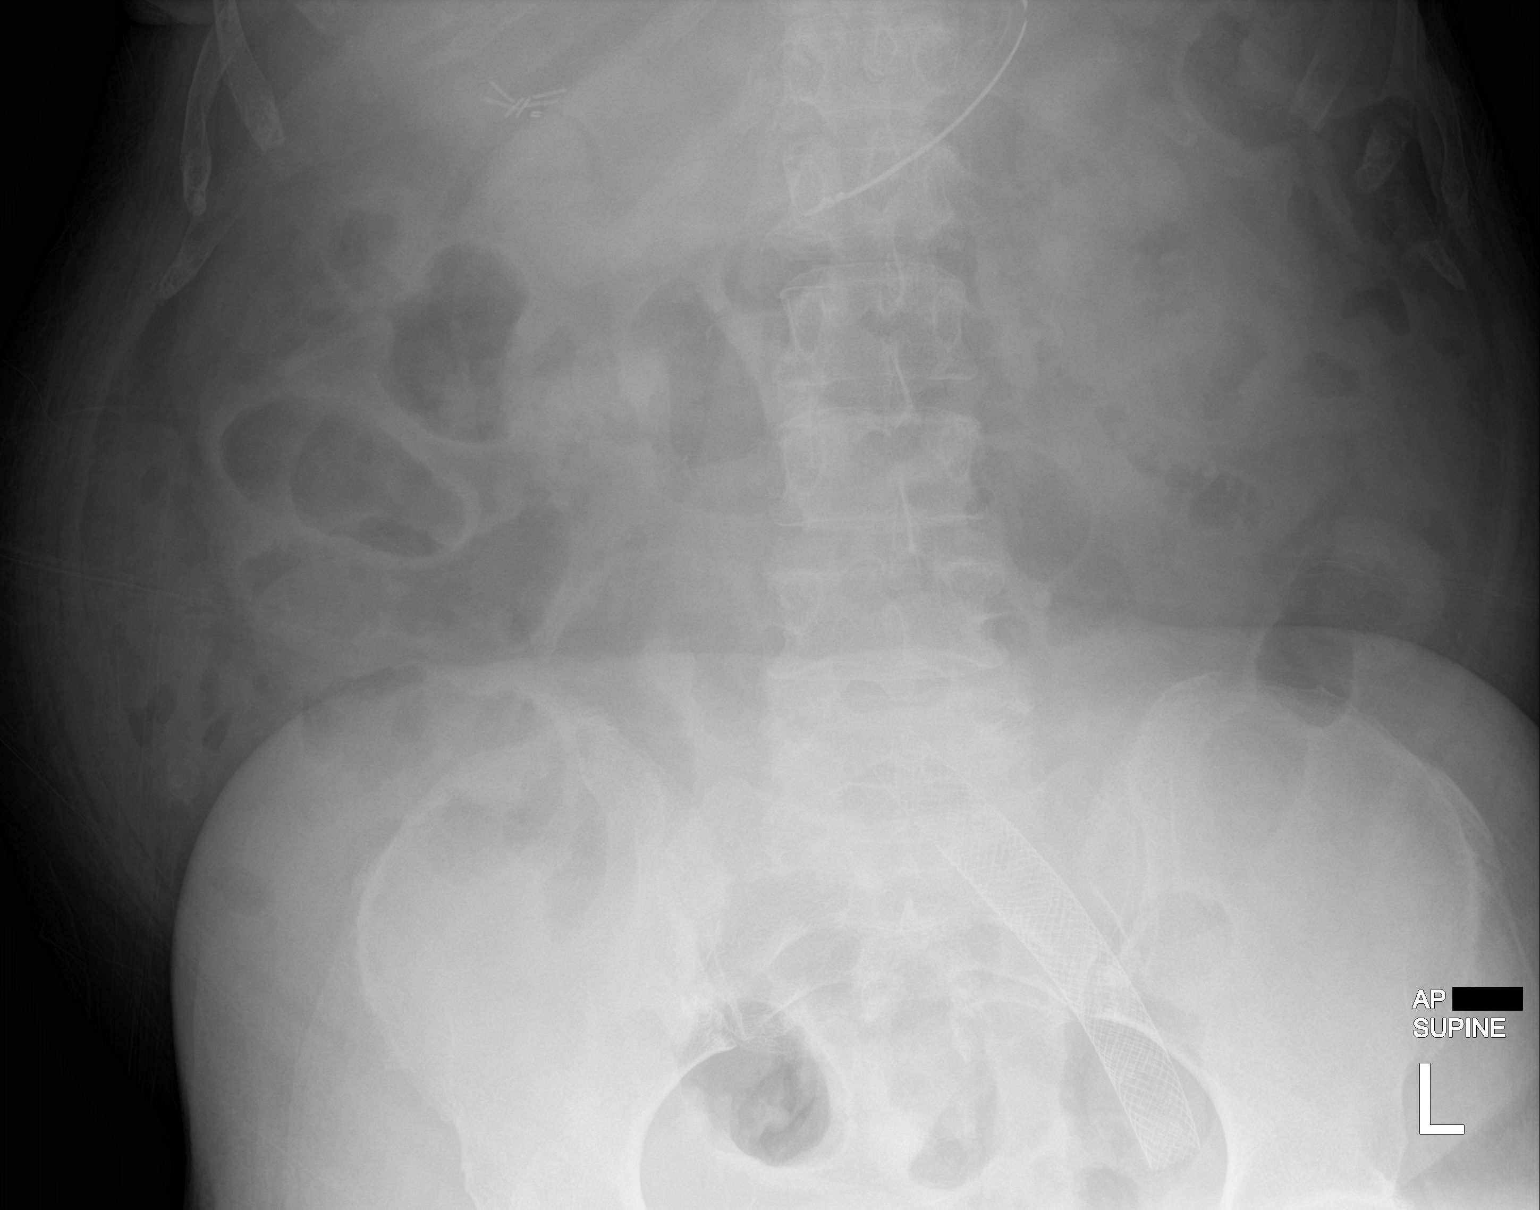

[1 of 1 positions shown; findings below may reference images not displayed]

FINDINGS: An enteric tube tip overlies the stomach, similar in position to
prior exam. The gastroesophageal junction and lung bases are not
imaged on this exam.
IMPRESSION: Enteric tube tip overlying the stomach.

## 2021-05-24 IMAGING — DX DG ABD PORTABLE 1V
1 series · 2 of 2 positions shown · non-contrast
Comparison: 05/24/2021

CLINICAL DATA: Small bowel obstruction 8 hour delayed film.

EXAM:
PORTABLE ABDOMEN - 1 VIEW

[Series 1: abdomen · 0.14mm/px · 2 of 2 slices shown]
[im 1/2]
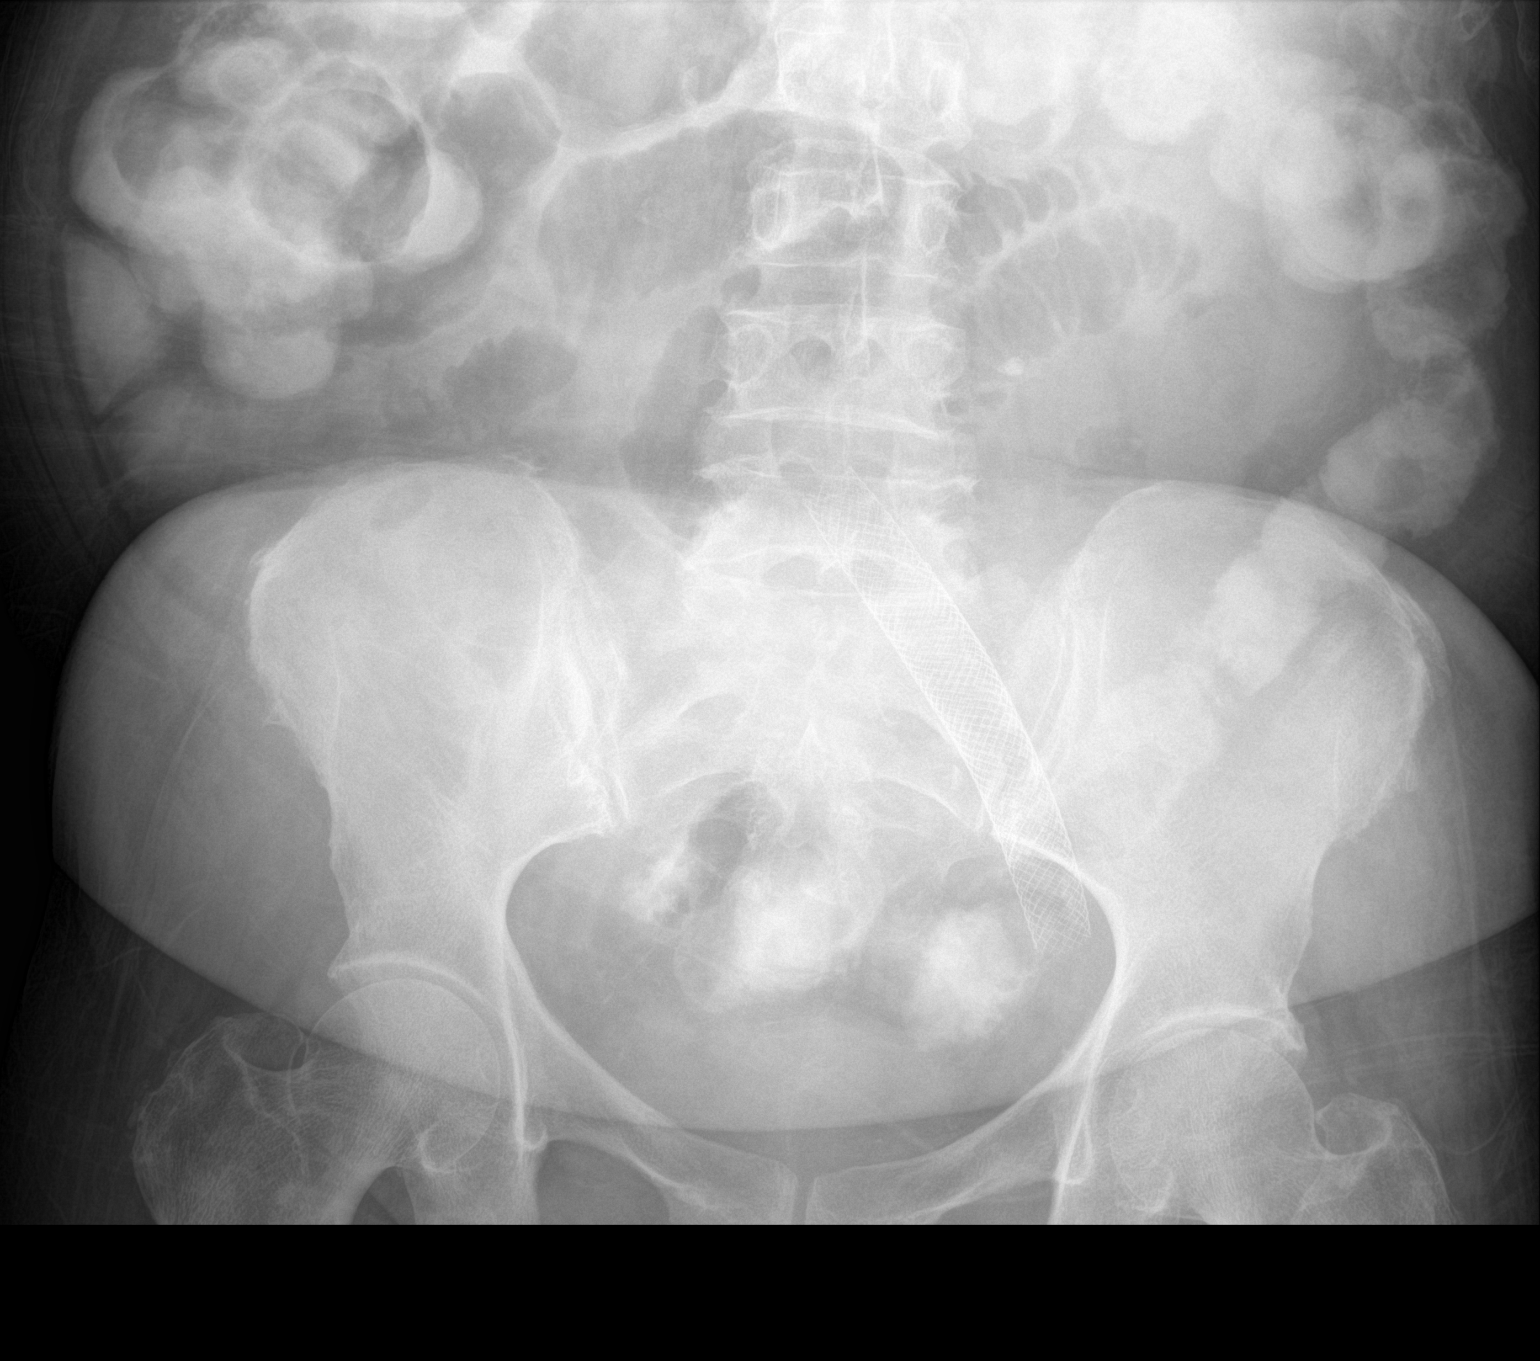
[im 2/2]
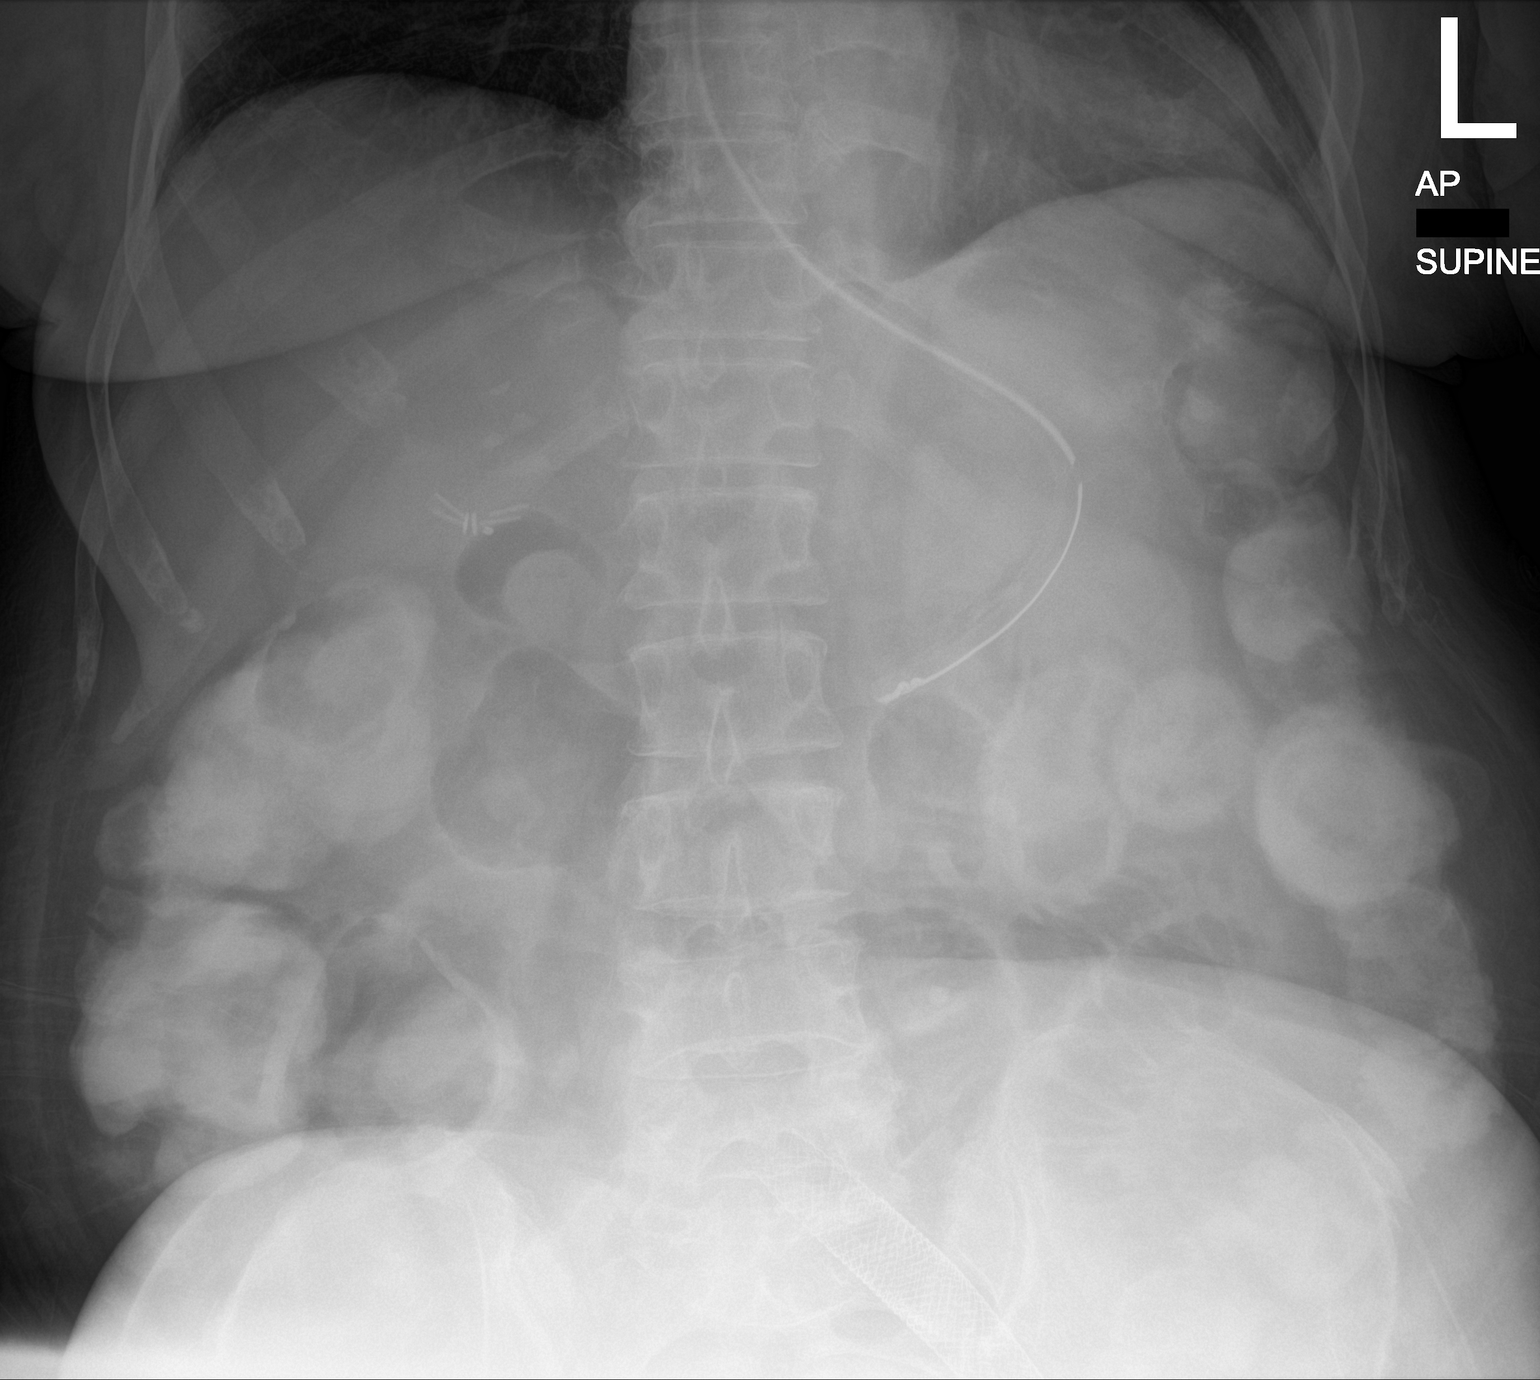

[2 of 2 positions shown; findings below may reference images not displayed]

FINDINGS: Supine views of the abdomen were obtained 8 hours after enteric
administration of contrast material. Contrast material is
demonstrated throughout the colon suggesting no evidence of
high-grade small bowel obstruction. A few mildly dilated gas-filled
small bowel loops are present suggesting partial obstruction or
ileus.

Vascular stent in the pelvis. Degenerative changes in the spine and
hips. Surgical clips in the right upper quadrant.
IMPRESSION: Contrast material is demonstrated throughout the colon suggesting no
evidence of high-grade small bowel obstruction. Changes may
represent ileus or partial obstruction.

## 2021-05-24 MED ORDER — DIATRIZOATE MEGLUMINE & SODIUM 66-10 % PO SOLN
90.0000 mL | Freq: Once | ORAL | Status: AC
  Administered 2021-05-24: 90 mL via NASOGASTRIC
  Filled 2021-05-24: qty 90

## 2021-05-24 MED ORDER — POTASSIUM CHLORIDE IN NACL 20-0.9 MEQ/L-% IV SOLN
INTRAVENOUS | Status: DC
  Filled 2021-05-24 (×3): qty 1000

## 2021-05-24 NOTE — Progress Notes (Signed)
Emma in x-ray aware of gastrografin admin at [phone]. Will continue to monitor.

## 2021-05-24 NOTE — Progress Notes (Signed)
ANTICOAGULATION CONSULT NOTE - Follow-Up Consult  Pharmacy Consult for Heparin IV Indication: DVT hx (PTA eliquis)  Patient Measurements: Height: '5\' 8"'$  (172.7 cm) Weight: 95.3 kg (210 lb) IBW/kg (Calculated) : 63.9 Heparin Dosing Weight: 84.5  Vital Signs: Temp: 97.7 F (36.5 C) (07/24 1454) Temp Source: Oral (07/24 1454) BP: 163/79 (07/24 1454) Pulse Rate: 87 (07/24 1454)  Labs: Recent Labs    05/22/21 2137 05/23/21 0201 05/23/21 2039 05/24/21 0425 05/24/21 1549  HGB 13.4 12.8  --  11.4*  --   HCT 41.4 39.2  --  37.9  --   PLT 251 237  --  184  --   APTT  --   --  56* 54* 98*  HEPARINUNFRC  --   --  1.04* 0.96* 1.08*  CREATININE 1.00 1.05*  --  0.67  --      Estimated Creatinine Clearance: 73.4 mL/min (by C-G formula based on SCr of 0.67 mg/dL).  Assessment: 75 yo F with pmh of SBO, DM, GERD, HTN, RA and surgical Hx of cholecystectomy, hysterectomy and D&C with hernia repair. Presented for N/V/abd pain found to have SBO. PTA eliquis for previous hx of DVT/PE. Last dose 7/20. Pharmacy consulted to start IV Heparin.   aPTT this evening is therapeutic (aPTT 98 << 54, goal of 66-102) after a rate increase earlier today. Heparin level remains falsely elevated in the setting of recent Apixaban. No bleeding noted per discussion with RN.   Goal of Therapy:  Heparin level 0.3-0.7 units/ml aPTT 66-102 seconds Monitor platelets by anticoagulation protocol: Yes   Plan:  - Continue Heparin at 1600 units/hr (16 ml/hr) - Will continue to monitor for any signs/symptoms of bleeding and will follow up with aPTT level in 8 hours to confirm   Thank you for allowing pharmacy to be a part of this patient's care.  Alycia Rossetti, PharmD, BCPS Clinical Pharmacist Clinical phone for 05/24/2021: [phone] 05/24/2021 4:43 PM   **Pharmacist phone directory can now be found on [email or web address] ([email or web address]).  Listed under [place].

## 2021-05-24 NOTE — Progress Notes (Signed)
CC: Abdominal pain  Subjective: Still having a lot of abdominal discomfort although not much through the NG.  She has had 3 bowel movements without improvement of her abdominal pain and tenderness.  Her potassium has been replaced.  Objective: Vital signs in last 24 hours: Temp:  [98.1 F (36.7 C)-99.3 F (37.4 C)] 99.3 F (37.4 C) (07/24 0536) Pulse Rate:  [87-91] 87 (07/24 0536) Resp:  [17-20] 18 (07/24 0536) BP: (138-164)/(74-79) 154/75 (07/24 0536) SpO2:  [97 %-99 %] 97 % (07/24 0536) Last BM Date: 05/23/21 N.p.o. 1200 IV Voided x1 recorded 550 NG BM x3 T-max 99.3, other vital signs are stable K+ 4.3, magnesium 1.9, WBC 8.6 Intake/Output from previous day: 07/23 0701 - 07/24 0700 In: 1199.9 [I.V.:1199.9] Out: 550 [Emesis/NG output:550] Intake/Output this shift: No intake/output data recorded.  General appearance: alert, cooperative, and no distress Resp: clear to auscultation bilaterally and down somewhat in the bases. GI: Soft, she is tender to palpation or any touch.  Still distended some with hypoactive bowel sounds.  Lab Results:  Recent Labs    05/23/21 0201 05/24/21 0425  WBC 9.6 8.6  HGB 12.8 11.4*  HCT 39.2 37.9  PLT 237 184    BMET Recent Labs    05/23/21 0201 05/24/21 0425  NA 139 141  K 2.9* 4.3  CL 104 111  CO2 22 21*  GLUCOSE 205* 133*  BUN 21 21  CREATININE 1.05* 0.67  CALCIUM 8.8* 8.7*   PT/INR No results for input(s): LABPROT, INR in the last 72 hours.  Recent Labs  Lab 05/22/21 1054 05/23/21 0201  AST 27 17  ALT 22 17  ALKPHOS 68 54  BILITOT 0.6 0.8  PROT 7.8 6.8  ALBUMIN 4.0 3.4*     Lipase     Component Value Date/Time   LIPASE 27 05/22/2021 1054     Prior to Admission medications   Medication Sig Start Date End Date Taking? Authorizing Provider  amLODipine (NORVASC) 5 MG tablet Take 5 mg by mouth every morning. 05/13/21  Yes [provider]  aspirin EC 81 MG tablet Take 162 mg by mouth daily as  needed (pain/headache). Swallow whole.   Yes [provider]  clotrimazole-betamethasone (LOTRISONE) cream Apply 1 application topically 2 (two) times daily. Apply to feet   Yes [provider]  docusate sodium (COLACE) 100 MG capsule Take 100 mg by mouth daily.   Yes [provider]  DULoxetine (CYMBALTA) 20 MG capsule Take 20 mg by mouth every morning. 04/30/21  Yes [provider]  folic acid (FOLVITE) 1 MG tablet Take 1 tablet (1 mg total) by mouth daily. 02/26/21  Yes Medina-Vargas, Monina C, NP  metFORMIN (GLUCOPHAGE-XR) 500 MG 24 hr tablet Take 1 tablet by mouth 2 (two) times daily with a meal. 01/03/21  Yes [provider]  methotrexate (RHEUMATREX) 2.5 MG tablet Take 8 tablets (20 mg total) by mouth once a week. 8 tablets weekly (Sunday) Patient taking differently: Take 20 mg by mouth every Sunday. 02/26/21  Yes Medina-Vargas, Monina C, NP  polyethylene glycol (MIRALAX / GLYCOLAX) 17 g packet Take 17 g by mouth daily. Patient taking differently: Take 17 g by mouth every other day. 02/26/21  Yes Medina-Vargas, Monina C, NP  rosuvastatin (CRESTOR) 10 MG tablet Take 1 tablet (10 mg total) by mouth daily. 02/26/21  Yes Medina-Vargas, Monina C, NP  tiZANidine (ZANAFLEX) 4 MG tablet Take 4 mg by mouth every morning. 04/29/21  Yes [provider]  acetaminophen (TYLENOL) 500 MG tablet Take 1 tablet (500 mg total) by mouth every 6 (six) hours as needed. Patient not taking: No sig reported 12/10/19   Horton, Barbette Hair, MD  amLODipine (NORVASC) 10 MG tablet Take 1 tablet (10 mg total) by mouth daily. Patient not taking: No sig reported 02/26/21   Medina-Vargas, Monina C, NP  apixaban (ELIQUIS) 5 MG TABS tablet Take 1 tablet (5 mg total) by mouth 2 (two) times daily. 02/26/21   Medina-Vargas, Monina C, NP  DULoxetine (CYMBALTA) 30 MG capsule Take 1 capsule (30 mg total) by mouth daily. Patient not taking: Reported on 05/22/2021 02/26/21   Medina-Vargas, Monina  C, NP  gabapentin (NEURONTIN) 300 MG capsule Take 1 capsule (300 mg total) by mouth 2 (two) times daily. Patient not taking: Reported on 05/22/2021 02/26/21   Medina-Vargas, Monina C, NP  metFORMIN (GLUCOPHAGE) 500 MG tablet Take 2 tablets (1,000 mg total) by mouth daily. Patient not taking: No sig reported 02/26/21   Medina-Vargas, Monina C, NP    Medications:  insulin aspart  0-9 Units Subcutaneous TID WC    Assessment/Plan Abdominal pain, nausea and vomiting Hx of umbilical hernia, laparoscopic cholecystectomy, hysterectomy and bilateral oophorectomy, umbilical hernia repair with mesh 11/24/2017, SBO 01/22/2021  Hypokalemia  - K+ 2.9>>4.3   Mag 1.9  -CT 7/22 small bowel dilatation with large fecal burden throughout the colon; concern for ileocecal valve fullness. Plan: Bowel protocol today.,  Decrease IV fluids.   FEN:NPO/ no IV fluids ordered ID: None DVT:IV heparin   Hypertension Type 2 diabetes with diabetic neuropathy Peripheral neuropathy Autoimmune skin disease Rheumatoid arthritis Hx of DVT - on Eliquis        LOS: 2 days    Mayte Diers 05/24/2021 Please see Amion

## 2021-05-24 NOTE — Progress Notes (Signed)
Patient is a very hard stick, phlebotomist Chiropodist) called and she has concern that patient is a very hard stick. Writer then asked the patient if she wants to be stuck in her foot but then patient refused stated that she have pain in her foot & she have arthritis. Secure chat Lovey Newcomer ,NP if patient will benefit from PICC and stated she wants to wait for the attending's decision for that and Picc probably cannot be placed tonight. New order received that IV team can draw lab in PIV for now. IV team called Probation officer and they stated can't draw lab from PIV. NP made aware and writer called phlebotomist and she stated she will try again.  Call bell within reach and will continue to monitor.

## 2021-05-24 NOTE — Progress Notes (Addendum)
ANTICOAGULATION CONSULT NOTE  Pharmacy Consult for Heparin IV Indication: DVT hx (PTA eliquis)  Labs: Recent Labs    05/22/21 2137 05/23/21 0201 05/23/21 2039 05/23/21 2039 05/24/21 0425 05/24/21 1549 05/24/21 2318  HGB 13.4 12.8  --   --  11.4*  --   --   HCT 41.4 39.2  --   --  37.9  --   --   PLT 251 237  --   --  184  --   --   APTT  --   --  56*   < > 54* 98* 76*  HEPARINUNFRC  --   --  1.04*  --  0.96* 1.08*  --   CREATININE 1.00 1.05*  --   --  0.67  --   --    < > = values in this interval not displayed.   Assessment: 75 yo F with pmh of SBO, DM, GERD, HTN, RA and surgical Hx of cholecystectomy, hysterectomy and D&C with hernia repair. Presented for N/V/abd pain found to have SBO. PTA eliquis for previous hx of DVT/PE. Last dose 7/20. Pharmacy consulted to start IV Heparin.   aPTT 76 sec  Goal of Therapy:  Heparin level 0.3-0.7 units/ml aPTT 66-102 seconds Monitor platelets by anticoagulation protocol: Yes   Plan:  Continue heparin gtt at 1600 units/hr F/u aPTT/heparin level repeat with AM labs  Thanks for allowing pharmacy to be a part of this patient's care.  Excell Seltzer, PharmD Clinical Pharmacist  05/24/2021 11:48 PM

## 2021-05-24 NOTE — Progress Notes (Signed)
[place] for Heparin IV Indication: DVT hx (PTA eliquis)  Labs: Recent Labs    05/22/21 1054 05/22/21 2137 05/23/21 0201 05/23/21 2039 05/24/21 0425  HGB 14.6 13.4 12.8  --  11.4*  HCT 48.1* 41.4 39.2  --  37.9  PLT 251 251 237  --  184  APTT  --   --   --  56* 54*  HEPARINUNFRC  --   --   --  1.04* 0.96*  CREATININE 0.70 1.00 1.05*  --   --     Assessment: 75 yo F with pmh of SBO, DM, GERD, HTN, RA and surgical Hx of cholecystectomy, hysterectomy and D&C with hernia repair. Presented for N/V/abd pain found to have SBO. PTA eliquis for previous hx of DVT/PE. Last dose 7/20. Pharmacy consulted to start IV Heparin.   aPTT 54 sec, heparin level elevated falsely due to eliquis use.  Goal of Therapy:  Heparin level 0.3-0.7 units/ml aPTT 66-102 seconds Monitor platelets by anticoagulation protocol: Yes   Plan:  Increase heparin gtt to 1600 units/hr F/u aPTT/heparin level in 6-8 hours   Thanks for allowing pharmacy to be a part of this patient's care.  Excell Seltzer, PharmD Clinical Pharmacist  05/24/2021 5:44 AM

## 2021-05-24 NOTE — Progress Notes (Signed)
PROGRESS NOTE  Diane [name]  [id number] DOB: 1946/11/01 DOA: 05/22/2021 PCP: [name], MD  Brief Narrative:  75 year old black female, lives home alone-independent--she has some help HTN NIDDM Anxiety depression mild cognitive deficit worsened since husband passing September 2018 Rheumatoid arthritis -- Plaquenil methotrexate  Acute DVT 02/10/2021 admitted for the same status post thrombolysis,  L common iliac venous stent-Dr. Oneida Alar  Multiple partial small bowel obstructions?  Stricture?  Inflammation followed by Dr. Collene Mares on steroids  Developed sudden onset abdominal pain constant epigastric food colored vomit no flatus early a.m. 05/23/2021 No stool for several days T-max 99 pulse pulse 115 CT shows SBO fluid distended loops extending to the ileocecal valve with steatosis  Hospital-Problem based course  Partial small bowel obstruction-previous umbilical hernia repair hysterectomy etc. and prior SBO in the past -NG decompression per general surgery - Soapsuds enema on admit, SBO protocol with gastrografin per Gen surg 7/24 -Lactic acidosis, leukocytosis resolved  Hypokalemia - Replace with saline and 40 of K  -improved--scale back rate to 50cc/h  DVT 02/10/2021 status post thrombolysis and left iliac stent - Home apixaban 5 twice daily held - Start cautious heparin lower range of therapeutic per pharmacy--no reports of active bleed today - Gabapentin 300 resume when able to take p.o. otherwise hold  Rheumatoid arthritis - On methotrexate 20 mg q. Sunday-on hold at this time  DM TY 2 - Metformin 2000 mg daily held, gabapentin as above when able -Continue sliding scale every 4 coverage-sugars 94-169  Depression situational - Holding at this time Cymbalta 30    DVT prophylaxis: Heparin GTT Code Status: Full code Family Communication: called AND MIJA, ANGLADE Daughter   [phone]   Disposition:  Status is: Inpatient  Remains inpatient  appropriate because:Ongoing active pain requiring inpatient pain management and Unsafe d/c plan  Dispo: The patient is from: Home              Anticipated d/c is to: Home              Patient currently is not medically stable to d/c.   Difficult to place patient No   Consultants:  In general surgery  Procedures: Scans  Antimicrobials:   None   Subjective: Sitting quietly in bed NG tube in place  More sleepy Pain 5/10--just received meds for pain Some report this am of dark blood in NGT?--none notably by me today  Objective: Vitals:   05/23/21 2111 05/24/21 0315 05/24/21 0536 05/24/21 0833  BP: (!) 164/74 (!) 155/79 (!) 154/75 (!) 151/69  Pulse: 88 91 87 86  Resp: '18 17 18 16  '$ Temp: 98.4 F (36.9 C) 99.3 F (37.4 C) 99.3 F (37.4 C) 98.4 F (36.9 C)  TempSrc: Oral Oral Oral Oral  SpO2: 99% 97% 97% 100%  Weight:      Height:        Intake/Output Summary (Last 24 hours) at 05/24/2021 1436 Last data filed at 05/24/2021 [id number] Gross per 24 hour  Intake 1199.92 ml  Output 400 ml  Net 799.92 ml    Filed Weights   05/22/21 1049  Weight: 95.3 kg    Examination:  Quiet  black female no distress NG tube in place no icterus no pallor throat soft supple S1-S2 no murmur Distended abdomen decreased bowel sound, no rebound no guard Chest clear no added sound No lower extremity edema  Data Reviewed: personally reviewed   CBC    Component Value Date/Time   WBC 8.6 05/24/2021 0425  RBC 4.36 05/24/2021 0425   HGB 11.4 (L) 05/24/2021 0425   HCT 37.9 05/24/2021 0425   PLT 184 05/24/2021 0425   MCV 86.9 05/24/2021 0425   MCH 26.1 05/24/2021 0425   MCHC 30.1 05/24/2021 0425   RDW 15.9 (H) 05/24/2021 0425   LYMPHSABS 1.8 05/24/2021 0425   MONOABS 0.9 05/24/2021 0425   EOSABS 0.0 05/24/2021 0425   BASOSABS 0.0 05/24/2021 0425   CMP Latest Ref Rng & Units 05/24/2021 05/23/2021 05/22/2021  Glucose 70 - 99 mg/dL 133(H) 205(H) -  BUN 8 - 23 mg/dL 21 21 -  Creatinine  0.44 - 1.00 mg/dL 0.67 1.05(H) 1.00  Sodium 135 - 145 mmol/L 141 139 -  Potassium 3.5 - 5.1 mmol/L 4.3 2.9(L) -  Chloride 98 - 111 mmol/L 111 104 -  CO2 22 - 32 mmol/L 21(L) 22 -  Calcium 8.9 - 10.3 mg/dL 8.7(L) 8.8(L) -  Total Protein 6.5 - 8.1 g/dL - 6.8 -  Total Bilirubin 0.3 - 1.2 mg/dL - 0.8 -  Alkaline Phos 38 - 126 U/L - 54 -  AST 15 - 41 U/L - 17 -  ALT 0 - 44 U/L - 17 -     Radiology Studies: CT Abdomen Pelvis W Contrast  Result Date: 05/22/2021 CLINICAL DATA:  Abdominal pain, acute, nonlocalized EXAM: CT ABDOMEN AND PELVIS WITH CONTRAST TECHNIQUE: Multidetector CT imaging of the abdomen and pelvis was performed using the standard protocol following bolus administration of intravenous contrast. CONTRAST:  12m OMNIPAQUE IOHEXOL 300 MG/ML  SOLN COMPARISON:  CT abdomen pelvis 01/22/2021 FINDINGS: Lower chest: Linear atelectasis versus scarring within the right lower lobe. Similar finding within the left lower lobe. Also right middle lobe. Coronary artery calcifications. Possible tiny hiatal hernia. Hepatobiliary: The hepatic parenchyma is diffusely hypodense compared to the splenic parenchyma consistent with fatty infiltration. No focal liver abnormality. Status post cholecystectomy. No biliary dilatation. Pancreas: No focal lesion. Normal pancreatic contour. No surrounding inflammatory changes. No main pancreatic ductal dilatation. Spleen: Normal in size without focal abnormality. Adrenals/Urinary Tract: No adrenal nodule bilaterally. No nephrolithiasis, no hydronephrosis, and no contour-deforming renal mass. No ureterolithiasis or hydroureter. The urinary bladder is unremarkable. On delayed imaging, there is no urothelial wall thickening and there are no filling defects in the opacified portions of the bilateral collecting systems or ureters. Stomach/Bowel: Stomach is within normal limits. Small bowel distended with fluid measuring up to 3.4 cm. Fluid distended small bowel extends to the  terminal ileum. Hazy contour of the terminal ileum/ileocecal valve region with narrowing of the small bowel. Fluid density within the lumen of the cecum. No definite pneumatosis. Similar-appearing thickened base of the appendix with distal appendix appear normal (6:41). Vascular/Lymphatic: Limited evaluation of the left common and external iliac venous stent. No abdominal aorta or iliac aneurysm. Mild atherosclerotic plaque of the aorta and its branches. No abdominal, pelvic, or inguinal lymphadenopathy. Reproductive: Status post hysterectomy. No adnexal masses. Other: Interval increase of trace volume ascites. No intraperitoneal free gas. No organized fluid collection. Musculoskeletal: No abdominal wall hernia or abnormality. No suspicious lytic or blastic osseous lesions. No acute displaced fracture. IMPRESSION: 1. Similar-appearing partial small bowel obstruction with fluid distended small bowel loop extending to the ileocecal valve. Question ileocecal/base of the appendix mass. Recommend colonoscopy for further evaluation. 2. Interval increase in trace volumes ascites. 3. Hepatic steatosis. 4. Possible small hiatal hernia. Electronically Signed   By: MIven FinnM.D.   On: 05/22/2021 17:34   DG Abd Portable 1V-Small Bowel Protocol-Position  Verification  Result Date: 05/24/2021 CLINICAL DATA:  Nasogastric tube placement. EXAM: PORTABLE ABDOMEN - 1 VIEW COMPARISON:  Abdominal radiograph dated 05/23/2021. FINDINGS: An enteric tube tip overlies the stomach, similar in position to prior exam. The gastroesophageal junction and lung bases are not imaged on this exam. IMPRESSION: Enteric tube tip overlying the stomach. Electronically Signed   By: Zerita Boers M.D.   On: 05/24/2021 10:49   DG Abd Portable 1V  Result Date: 05/23/2021 CLINICAL DATA:  NG tube placement EXAM: PORTABLE ABDOMEN - 1 VIEW COMPARISON:  CT 05/22/2021 FINDINGS: Transesophageal tube tip and side port terminate in the left upper quadrant  the region of the gastric body, distal to the GE junction. Surgical clips again seen in the right upper quadrant. Vascular stenting of the left iliac is again seen. External support devices overlie the chest. Lung bases are clear aside from some minimal atelectasis. Distended upper abdominal bowel is less well visualized on this radiograph. Please note this is neither a complete view of the chest or abdomen. IMPRESSION: Transesophageal tube tip and side port terminate in the region of the gastric body, distal to the GE junction. Electronically Signed   By: Lovena Le M.D.   On: 05/23/2021 00:49     Scheduled Meds:  insulin aspart  0-9 Units Subcutaneous TID WC   Continuous Infusions:  0.9 % NaCl with KCl 20 mEq / L     heparin 1,600 Units/hr (05/24/21 1030)     LOS: 2 days   Time spent: 65  Nita Sells, MD [facility] To contact the attending provider between 7A-7P or the covering provider during after hours 7P-7A, please log into the web site [email or web address] and access using universal [place] password for that web site. If you do not have the password, please call the hospital operator.  05/24/2021, 2:36 PM

## 2021-05-24 NOTE — Progress Notes (Signed)
Patient's NGT output noted to have some scant dark red spots mixed with bilious output. Patient currenlty on Heparin drip. Patient is alert and verbal, denies any pain. V/S BP154/75 T99.3 RR18 PR87 02sat 97%RA.HGB 11.4. paged Noberto Retort paged/securechat and replied to continue to close monitor patient.  Call bell within reach and will continue to monitor.

## 2021-05-24 NOTE — Plan of Care (Signed)
  Problem: Education: Goal: Knowledge of General Education information will improve Description Including pain rating scale, medication(s)/side effects and non-pharmacologic comfort measures Outcome: Progressing   Problem: Health Behavior/Discharge Planning: Goal: Ability to manage health-related needs will improve Outcome: Progressing   Problem: Clinical Measurements: Goal: Ability to maintain clinical measurements within normal limits will improve Outcome: Progressing Goal: Will remain free from infection Outcome: Progressing   Problem: Activity: Goal: Risk for activity intolerance will decrease Outcome: Progressing   Problem: Coping: Goal: Level of anxiety will decrease Outcome: Progressing   Problem: Elimination: Goal: Will not experience complications related to bowel motility Outcome: Progressing Goal: Will not experience complications related to urinary retention Outcome: Progressing   Problem: Pain Managment: Goal: General experience of comfort will improve Outcome: Progressing   Problem: Safety: Goal: Ability to remain free from injury will improve Outcome: Progressing   Problem: Skin Integrity: Goal: Risk for impaired skin integrity will decrease Outcome: Progressing   

## 2021-05-25 DIAGNOSIS — K56609 Unspecified intestinal obstruction, unspecified as to partial versus complete obstruction: Secondary | ICD-10-CM | POA: Diagnosis not present

## 2021-05-25 LAB — GLUCOSE, CAPILLARY
Glucose-Capillary: 72 mg/dL (ref 70–99)
Glucose-Capillary: 101 mg/dL — ABNORMAL HIGH (ref 70–99)
Glucose-Capillary: 77 mg/dL (ref 70–99)
Glucose-Capillary: 60 mg/dL — ABNORMAL LOW (ref 70–99)
Glucose-Capillary: 59 mg/dL — ABNORMAL LOW (ref 70–99)
Glucose-Capillary: 86 mg/dL (ref 70–99)
Glucose-Capillary: 115 mg/dL — ABNORMAL HIGH (ref 70–99)

## 2021-05-25 LAB — HEMOGLOBIN A1C
Hgb A1c MFr Bld: 7.8 % — ABNORMAL HIGH (ref 4.8–5.6)
Mean Plasma Glucose: 177 mg/dL

## 2021-05-25 LAB — HEPARIN LEVEL (UNFRACTIONATED): Heparin Unfractionated: 1.02 IU/mL — ABNORMAL HIGH (ref 0.30–0.70)

## 2021-05-25 LAB — APTT: aPTT: 131 seconds — ABNORMAL HIGH (ref 24–36)

## 2021-05-25 MED ORDER — INSULIN ASPART 100 UNIT/ML IJ SOLN
0.0000 [IU] | Freq: Four times a day (QID) | INTRAMUSCULAR | Status: DC
  Administered 2021-05-26: 1 [IU] via SUBCUTANEOUS
  Administered 2021-05-26 – 2021-05-27 (×2): 2 [IU] via SUBCUTANEOUS

## 2021-05-25 MED ORDER — ENOXAPARIN SODIUM 100 MG/ML IJ SOSY
100.0000 mg | PREFILLED_SYRINGE | Freq: Two times a day (BID) | INTRAMUSCULAR | Status: DC
  Administered 2021-05-25 (×2): 100 mg via SUBCUTANEOUS
  Filled 2021-05-25 (×3): qty 1

## 2021-05-25 MED ORDER — OXYCODONE HCL 5 MG PO TABS: 5.0000 mg | ORAL_TABLET | Freq: Four times a day (QID) | ORAL | Status: DC | PRN

## 2021-05-25 MED ORDER — MORPHINE SULFATE (PF) 2 MG/ML IV SOLN: 2.0000 mg | INTRAVENOUS | Status: DC | PRN

## 2021-05-25 NOTE — Progress Notes (Signed)
According to the phlebotomist, she was just able to draw and get 1 tube from patient for APTT/Heparin level but wasn't able to get the rest. Patient is a hard stick. Writer endorse to incoming shift nurse to ask the attending about this concern.

## 2021-05-25 NOTE — Progress Notes (Signed)
[facility] Progress Note  Patient: Diane [name]    [id number]  DOA: 05/22/2021     Date of Service: the patient was seen and examined on 05/25/2021  Brief hospital course: Past medical history of HTN, type II DM, anxiety, depression, pulmonary arthritis, DVT, PVD.  Presents with complaints of intractable nausea and vomiting.  Found to have partial SBO.  Treated conservatively. General surgery consulted.  Currently plan is monitor improvement after removal of the NG tube.  Subjective: Still has right lower quadrant pain.  No nausea no vomiting.  Passing gas.  Had a bowel movement as well.  General surgery planning to remove the NG tube.  Assessment and Plan: 1.  Partial SBO History of umbilical hernia repair Hysterectomy SP small bowel protocol.  Contrast in the colon. NG tube was inserted.  General surgery planning to remove today. Monitor for improvement. Continue with IV fluids. Suspect chronic constipation as a cause for SBO.  2.  Hypokalemia Replacing.  Monitor.  3.  History of DVT Was on IV heparin.  We will transition to Lovenox.  Discussed with surgery. Resume back to Eliquis once able to tolerate p.o. satisfactorily.  4.  Rheumatoid arthritis On methotrexate. Currently on hold.  5.  Type 2 diabetes mellitus, uncontrolled with hypoglycemia without long-term insulin use and complication. On metformin currently on hold. Currently continuing sliding scale insulin.  6.  History of depression On Cymbalta. Currently on hold.  Scheduled Meds:  enoxaparin (LOVENOX) injection  100 mg Subcutaneous Q12H   insulin aspart  0-9 Units Subcutaneous QID   Continuous Infusions:  0.9 % NaCl with KCl 20 mEq / L 50 mL/hr at 05/25/21 0659   PRN Meds: ketorolac, oxyCODONE, promethazine  Body mass index is 31.93 kg/m.        DVT Prophylaxis: Subcutaneous Lovenox      Advance goals of care discussion: Pt is Full code.  Family Communication: no family was  present at bedside, at the time of interview.   Data Reviewed: I have personally reviewed and interpreted daily labs, tele strips, imaging. Blood sugar mildly low.  aPTT elevated.  Physical Exam:  General: Appear in mild distress, no Rash; Oral Mucosa Clear, moist. no Abnormal Neck Mass Or lumps, Conjunctiva normal  Cardiovascular: S1 and S2 Present, no Murmur, Respiratory: good respiratory effort, Bilateral Air entry present and CTA, no Crackles, no wheezes Abdomen: Bowel Sound present, Soft and no tenderness Extremities: no Pedal edema Neurology: alert and oriented to time, place, and person affect appropriate. no new focal deficit Gait not checked due to patient safety concerns  Vitals:   05/25/21 0444 05/25/21 0745 05/25/21 1152 05/25/21 1624  BP: (!) 155/73 (!) 173/77 (!) 169/78 (!) 172/88  Pulse: 82 87 92 86  Resp: '18 18 18 18  '$ Temp: 98.1 F (36.7 C) 98.5 F (36.9 C) 98.5 F (36.9 C) 98.9 F (37.2 C)  TempSrc: Oral Oral Oral Oral  SpO2: 97% 98% 100% 100%  Weight:      Height:        Disposition:  Status is: Inpatient  Remains inpatient appropriate because:IV treatments appropriate due to intensity of illness or inability to take PO  Dispo: The patient is from: Home              Anticipated d/c is to: Home              Patient currently is not medically stable to d/c.   Difficult to place patient No  Time spent:  35 minutes. I reviewed all nursing notes, pharmacy notes, vitals, pertinent old records. I have discussed plan of care as described above with RN.  Author: Berle Mull, MD [facility] 05/25/2021 5:02 PM  To reach On-call, see care teams to locate the attending and reach out via [email or web address]. Between 7PM-7AM, please contact night-coverage If you still have difficulty reaching the attending provider, please page the [facility] (Director on Call) for [facility] on amion for assistance.

## 2021-05-25 NOTE — Evaluation (Addendum)
Physical Therapy Evaluation Patient Details Name: Diane [name] MRN: [id number] DOB: [birth year] Today's Date: 05/25/2021   History of Present Illness  75 yo female with onset of abd and distension had another SBO and  was readmitted, has just had NG tube clamped.  Has received enema, now permitted mobility but needs to be near BR.  PMHx:  DVT, SBO, RA, DM, GERD, HTN, cholecystectomy, hernia repair  Clinical Impression  Pt was seen for mobility on RW, with pt demonstrating better control of balance with walker but tends to have to reach the BR quickly with recovery of SBO and its' symptoms.  Follow along with her to increase endurance and add stair training when able.  Pt is mildly weak in LE's and will benefit from strengthening and work on balance control for increased home safety.  Follow for acute PT goals.    Follow Up Recommendations Home health PT;Supervision for mobility/OOB    Equipment Recommendations  Rolling walker with 5" wheels    Recommendations for Other Services       Precautions / Restrictions Precautions Precautions: Fall Precaution Comments: urgency issues GI due to tx of SBO Restrictions Weight Bearing Restrictions: No      Mobility  Bed Mobility Overal bed mobility: Needs Assistance Bed Mobility: Supine to Sit     Supine to sit: Min guard     General bed mobility comments: min guard to get to side of bed with railing    Transfers Overall transfer level: Needs assistance Equipment used: Rolling walker (2 wheeled);1 person hand held assist Transfers: Sit to/from Stand Sit to Stand: Min assist         General transfer comment: min assist to power up from bed but finally from [facility] could stand with chair alone  Ambulation/Gait Ambulation/Gait assistance: Min guard Gait Distance (Feet): 60 Feet Assistive device: Rolling walker (2 wheeled);1 person hand held assist Gait Pattern/deviations: Step-through pattern;Decreased stride length;Wide base of  support Gait velocity: reduced Gait velocity interpretation: <1.31 ft/sec, indicative of household ambulator General Gait Details: pt was interested in hallway walk but limited due to her twinges in abd that led to needing to use BSC quickly  Stairs Stairs:  (deferred)          Wheelchair Mobility    Modified Rankin (Stroke Patients Only)       Balance Overall balance assessment: Needs assistance Sitting-balance support: Feet supported Sitting balance-Leahy Scale: Fair     Standing balance support: Bilateral upper extremity supported;During functional activity Standing balance-Leahy Scale: Poor                               Pertinent Vitals/Pain Pain Assessment: Faces Faces Pain Scale: Hurts little more Pain Location: twinges in abd area from GI urgency Pain Descriptors / Indicators: Guarding;Grimacing Pain Intervention(s): Limited activity within patient's tolerance;Monitored during session;Repositioned    Home Living Family/patient expects to be discharged to:: Private residence Living Arrangements: Alone Available Help at Discharge: Family;Available PRN/intermittently Type of Home: House Home Access: Level entry;Stairs to enter Entrance Stairs-Rails: None Entrance Stairs-Number of Steps: 2 Home Layout: Two level;Able to live on main level with bedroom/bathroom Home Equipment: Gilford Rile - 2 wheels Additional Comments: family brings food, has cleaning service and can drive at times    Prior Function Level of Independence: Needs assistance   Gait / Transfers Assistance Needed: Independent with ambulation  ADL's / Homemaking Assistance Needed: Reports daughter assists with bathing and dressing  as needed.        Hand Dominance   Dominant Hand: Right    Extremity/Trunk Assessment   Upper Extremity Assessment Upper Extremity Assessment: Overall WFL for tasks assessed    Lower Extremity Assessment Lower Extremity Assessment: Generalized  weakness    Cervical / Trunk Assessment Cervical / Trunk Assessment: Kyphotic (mild)  Communication   Communication: No difficulties  Cognition Arousal/Alertness: Awake/alert Behavior During Therapy: WFL for tasks assessed/performed Overall Cognitive Status: Within Functional Limits for tasks assessed                                        General Comments General comments (skin integrity, edema, etc.): Pt was seen for mobility on RW with general struggles of GI symptoms as SBO resolves, and is mildly weak but expecting to go home with family support    Exercises     Assessment/Plan    PT Assessment Patient needs continued PT services  PT Problem List Decreased strength;Decreased activity tolerance;Decreased balance;Decreased mobility;Pain       PT Treatment Interventions DME instruction;Gait training;Stair training;Functional mobility training;Therapeutic activities;Therapeutic exercise;Neuromuscular re-education;Patient/family education;Balance training    PT Goals (Current goals can be found in the Care Plan section)  Acute Rehab PT Goals Patient Stated Goal: to get home and feel better PT Goal Formulation: With patient Time For Goal Achievement: 06/08/21 Potential to Achieve Goals: Good    Frequency Min 3X/week   Barriers to discharge Inaccessible home environment;Decreased caregiver support home with drop in help and two steps to enter    Co-evaluation               AM-PAC PT "6 Clicks" Mobility  Outcome Measure Help needed turning from your back to your side while in a flat bed without using bedrails?: A Little Help needed moving from lying on your back to sitting on the side of a flat bed without using bedrails?: A Little Help needed moving to and from a bed to a chair (including a wheelchair)?: A Little Help needed standing up from a chair using your arms (e.g., wheelchair or bedside chair)?: A Little Help needed to walk in hospital  room?: A Little Help needed climbing 3-5 steps with a railing? : A Lot 6 Click Score: 17    End of Session Equipment Utilized During Treatment: Gait belt Activity Tolerance: Patient tolerated treatment well;Patient limited by fatigue Patient left: in chair;with call bell/phone within reach;with chair alarm set Nurse Communication: Mobility status PT Visit Diagnosis: Unsteadiness on feet (R26.81);Muscle weakness (generalized) (M62.81);Difficulty in walking, not elsewhere classified (R26.2)    Time: [id number] PT Time Calculation (min) (ACUTE ONLY): 32 min   Charges:   PT Evaluation $PT Eval Moderate Complexity: 1 Mod PT Treatments $Gait Training: 8-22 mins       Ramond Dial 05/25/2021, 1:02 PM Mee Hives, PT MS Acute Rehab Dept. Number: [place]

## 2021-05-25 NOTE — Care Management Important Message (Signed)
Important Message  Patient Details  Name: Diane [name] MRN: [id number] Date of Birth: [birth year]   Medicare Important Message Given:  Yes     Killian Schwer Montine Circle 05/25/2021, 4:16 PM

## 2021-05-25 NOTE — Progress Notes (Signed)
Occupational Therapy Evaluation Patient Details Name: Diane [name] MRN: [id number] DOB: 03-Sep-1946 Today's Date: 05/25/2021    History of Present Illness 75 yo female with onset of abd and distension had another SBO and  was readmitted, has just had NG tube clamped.  Has received enema, now permitted mobility but needs to be near BR.  PMHx:  DVT, SBO, RA, DM, GERD, HTN, cholecystectomy, hernia repair   Clinical Impression   [name] was evaluated s/p the above impairments. PTA pt was mod I in most ADLs and has great family support for all needs. Pt reports her family brings her meals and helps her with ADLs as needed, and has hired help to assist with house keeping. Upon evaluation pt completed all mobility with min A for elevating trunk and boosting into standing. She completed grooming and bathing at EOB with several sit<>stands. Pt has good sitting balance and fair standing balance during functional activity. Pt benefits from continued OT acutely to progress function in all Adls and IADls. Recommend d/c home without OT follow up.     Follow Up Recommendations  No OT follow up;Supervision/Assistance - 24 hour    Equipment Recommendations  None recommended by OT       Precautions / Restrictions Precautions Precautions: Fall Precaution Comments: urgency issues GI due to tx of SBO Restrictions Weight Bearing Restrictions: No      Mobility Bed Mobility Overal bed mobility: Needs Assistance Bed Mobility: Supine to Sit     Supine to sit: Min assist     General bed mobility comments: min A for trunk elevation    Transfers Overall transfer level: Needs assistance Equipment used: Rolling walker (2 wheeled);1 person hand held assist Transfers: Sit to/from Stand Sit to Stand: Min assist         General transfer comment: min A to power up    Balance Overall balance assessment: Needs assistance Sitting-balance support: Feet supported Sitting balance-Leahy Scale: Good      Standing balance support: During functional activity;Single extremity supported Standing balance-Leahy Scale: Fair Standing balance comment: pt able to balanace in standing with 1 UE supported on walker adn 1 UE completing funcitonal task             ADL either performed or assessed with clinical judgement   ADL Overall ADL's : Needs assistance/impaired Eating/Feeding: Independent;Sitting   Grooming: Wash/dry hands;Oral care;Wash/dry face;Applying deodorant;Set up;Sitting   Upper Body Bathing: Set up;Sitting   Lower Body Bathing: Minimal assistance;Sit to/from stand   Upper Body Dressing : Set up;Sitting   Lower Body Dressing: Minimal assistance;Sit to/from stand   Toilet Transfer: Minimal assistance;RW;BSC   Toileting- Water quality scientist and Hygiene: Min guard;Sit to/from stand       Functional mobility during ADLs: Minimal assistance;Rolling walker General ADL Comments: pt groomed and bathed while sitting EOB with sit<>stand for rear care, min A for balance in standing     Vision   Vision Assessment?: No apparent visual deficits            Pertinent Vitals/Pain Pain Assessment: Faces Faces Pain Scale: Hurts a little bit Pain Location: abdominal discomfort Pain Descriptors / Indicators: Guarding;Grimacing Pain Intervention(s): Limited activity within patient's tolerance;Monitored during session     Hand Dominance Right   Extremity/Trunk Assessment Upper Extremity Assessment Upper Extremity Assessment: Overall WFL for tasks assessed   Lower Extremity Assessment Lower Extremity Assessment: Defer to PT evaluation   Cervical / Trunk Assessment Cervical / Trunk Assessment: Kyphotic   Communication Communication Communication: No  difficulties   Cognition Arousal/Alertness: Awake/alert Behavior During Therapy: WFL for tasks assessed/performed Overall Cognitive Status: Within Functional Limits for tasks assessed               General Comments  Pt  reports mild pain this session and apprciative of therapy services     Home Living Family/patient expects to be discharged to:: Private residence Living Arrangements: Alone Available Help at Discharge: Family;Available PRN/intermittently Type of Home: House Home Access: Level entry;Stairs to enter Entrance Stairs-Number of Steps: 2 Entrance Stairs-Rails: None Home Layout: Two level;Able to live on main level with bedroom/bathroom Alternate Level Stairs-Number of Steps: flight Alternate Level Stairs-Rails: Right;Left Bathroom Shower/Tub: Tub/shower unit;Curtain   Bathroom Toilet: Standard Bathroom Accessibility: Yes   Home Equipment: Environmental consultant - 2 wheels;Shower seat   Additional Comments: family brings food, has cleaning service and can drive at times      Prior Functioning/Environment Level of Independence: Needs assistance  Gait / Transfers Assistance Needed: Independent with ambulation ADL's / Homemaking Assistance Needed: Reports daughter assists with bathing and dressing as needed.   Comments: Pt reports she has assistance for showers and dressing, an aide comes once a week for house cleaning, her daughters assist with IADLs, she does not use a walking device in the home, uses a walker for the yard for community mobility        OT Problem List: Decreased strength;Decreased range of motion;Decreased activity tolerance;Impaired balance (sitting and/or standing);Decreased safety awareness;Decreased knowledge of use of DME or AE;Pain      OT Treatment/Interventions: Self-care/ADL training;Therapeutic exercise;DME and/or AE instruction;Therapeutic activities;Patient/family education;Balance training    OT Goals(Current goals can be found in the care plan section) Acute Rehab OT Goals Patient Stated Goal: to get home and feel better OT Goal Formulation: With patient Time For Goal Achievement: 06/08/21 Potential to Achieve Goals: Fair ADL Goals Pt Will Perform Tub/Shower  Transfer: with supervision;ambulating;shower seat;rolling walker Additional ADL Goal #1: Pt will complete at  BADLs at a supervision level with DME as needed to prepare for safe d/c home  OT Frequency: Min 2X/week    AM-PAC OT "6 Clicks" Daily Activity     Outcome Measure Help from another person eating meals?: None Help from another person taking care of personal grooming?: A Little Help from another person toileting, which includes using toliet, bedpan, or urinal?: A Little Help from another person bathing (including washing, rinsing, drying)?: A Little Help from another person to put on and taking off regular upper body clothing?: None Help from another person to put on and taking off regular lower body clothing?: A Little 6 Click Score: 20   End of Session Equipment Utilized During Treatment: Other (comment) (RW) Nurse Communication: Mobility status;Precautions;Weight bearing status  Activity Tolerance: Patient tolerated treatment well;No increased pain Patient left: in bed;with call bell/phone within reach;with bed alarm set  OT Visit Diagnosis: Unsteadiness on feet (R26.81);Other abnormalities of gait and mobility (R26.89);Muscle weakness (generalized) (M62.81);Pain                Time: 1456-1520 OT Time Calculation (min): 24 min Charges:  OT General Charges $OT Visit: 1 Visit OT Evaluation $OT Eval Moderate Complexity: 1 Mod OT Treatments $Self Care/Home Management : 8-22 mins    Raif Chachere A Rosalita Carey 05/25/2021, 3:29 PM

## 2021-05-25 NOTE — Progress Notes (Addendum)
ANTICOAGULATION CONSULT NOTE - Follow Up Consult  Pharmacy Consult for Heparin>>>Lovenox Indication:  h/o DVT  Allergies  Allergen Reactions   Cephalexin Other (See Comments)    URINARY RETENTION = "blocked my urine"   Codeine Hives and Rash   Nucynta [Tapentadol Hcl] Other (See Comments)    Altered mental status    Patient Measurements: Height: '5\' 8"'$  (172.7 cm) Weight: 95.3 kg (210 lb) IBW/kg (Calculated) : 63.9 Heparin Dosing Weight: 85 kg   Vital Signs: Temp: 98.5 F (36.9 C) (07/25 0745) Temp Source: Oral (07/25 0745) BP: 173/77 (07/25 0745) Pulse Rate: 87 (07/25 0745)  Labs: Recent Labs    05/22/21 2137 05/23/21 0201 05/23/21 2039 05/24/21 0425 05/24/21 1549 05/24/21 2318 05/25/21 0600  HGB 13.4 12.8  --  11.4*  --   --   --   HCT 41.4 39.2  --  37.9  --   --   --   PLT 251 237  --  184  --   --   --   APTT  --   --    < > 54* 98* 76* 131*  HEPARINUNFRC  --   --    < > 0.96* 1.08*  --  1.02*  CREATININE 1.00 1.05*  --  0.67  --   --   --    < > = values in this interval not displayed.    Estimated Creatinine Clearance: 73.4 mL/min (by C-G formula based on SCr of 0.67 mg/dL).  Assessment: Anticoag: PTA Eliquis '5mg'$  (last dose 7/20) now on IV Heparin for h/o VTE. HDW 85kg - Hep level 1.02, aPTT 131 large jump up? On 18.8 units/kg/hr, Hgb down to 11.3, Plts down to 184.  Goal of Therapy:  aPTT 66-102 Hep level 0.3-0.7   Plan:  Labs hard stick Change IV heparin to Lovenox '100mg'$  SQ BID CBC q72h while on LMWH  Ottis Vacha S. Alford Highland, PharmD, BCPS Clinical Staff Pharmacist [email or web address] Alford Highland, Otie Headlee Stillinger 05/25/2021,8:44 AM

## 2021-05-25 NOTE — Progress Notes (Signed)
CC: Abdominal pain  Subjective: Abdomen feels better, still a little tender on right side.  + flatus and multiple BMs.  Decrease in NGT output  Objective: Vital signs in last 24 hours: Temp:  [97.7 F (36.5 C)-99 F (37.2 C)] 98.5 F (36.9 C) (07/25 0745) Pulse Rate:  [82-91] 87 (07/25 0745) Resp:  [17-18] 18 (07/25 0745) BP: (149-173)/(71-80) 173/77 (07/25 0745) SpO2:  [97 %-100 %] 98 % (07/25 0745) Last BM Date: 05/24/21 N.p.o. 1200 IV Voided x1 recorded 550 NG BM x3 T-max 99.3, other vital signs are stable K+ 4.3, magnesium 1.9, WBC 8.6 Intake/Output from previous day: 07/24 0701 - 07/25 0700 In: 1867.6 [P.O.:120; I.V.:1747.6] Out: 304 [Urine:2; Emesis/NG output:300; Stool:2] Intake/Output this shift: No intake/output data recorded.  Abd: soft, mildly tender on right side of abdomen, +BS, obese, not really distended.  NGT with minimal output  Lab Results:  Recent Labs    05/23/21 0201 05/24/21 0425  WBC 9.6 8.6  HGB 12.8 11.4*  HCT 39.2 37.9  PLT 237 184    BMET Recent Labs    05/23/21 0201 05/24/21 0425  NA 139 141  K 2.9* 4.3  CL 104 111  CO2 22 21*  GLUCOSE 205* 133*  BUN 21 21  CREATININE 1.05* 0.67  CALCIUM 8.8* 8.7*   PT/INR No results for input(s): LABPROT, INR in the last 72 hours.  Recent Labs  Lab 05/22/21 1054 05/23/21 0201  AST 27 17  ALT 22 17  ALKPHOS 68 54  BILITOT 0.6 0.8  PROT 7.8 6.8  ALBUMIN 4.0 3.4*     Lipase     Component Value Date/Time   LIPASE 27 05/22/2021 1054     Prior to Admission medications   Medication Sig Start Date End Date Taking? Authorizing Provider  amLODipine (NORVASC) 5 MG tablet Take 5 mg by mouth every morning. 05/13/21  Yes [provider]  aspirin EC 81 MG tablet Take 162 mg by mouth daily as needed (pain/headache). Swallow whole.   Yes [provider]  clotrimazole-betamethasone (LOTRISONE) cream Apply 1 application topically 2 (two) times daily. Apply to feet    Yes [provider]  docusate sodium (COLACE) 100 MG capsule Take 100 mg by mouth daily.   Yes [provider]  DULoxetine (CYMBALTA) 20 MG capsule Take 20 mg by mouth every morning. 04/30/21  Yes [provider]  folic acid (FOLVITE) 1 MG tablet Take 1 tablet (1 mg total) by mouth daily. 02/26/21  Yes Medina-Vargas, Monina C, NP  metFORMIN (GLUCOPHAGE-XR) 500 MG 24 hr tablet Take 1 tablet by mouth 2 (two) times daily with a meal. 01/03/21  Yes [provider]  methotrexate (RHEUMATREX) 2.5 MG tablet Take 8 tablets (20 mg total) by mouth once a week. 8 tablets weekly (Sunday) Patient taking differently: Take 20 mg by mouth every Sunday. 02/26/21  Yes Medina-Vargas, Monina C, NP  polyethylene glycol (MIRALAX / GLYCOLAX) 17 g packet Take 17 g by mouth daily. Patient taking differently: Take 17 g by mouth every other day. 02/26/21  Yes Medina-Vargas, Monina C, NP  rosuvastatin (CRESTOR) 10 MG tablet Take 1 tablet (10 mg total) by mouth daily. 02/26/21  Yes Medina-Vargas, Monina C, NP  tiZANidine (ZANAFLEX) 4 MG tablet Take 4 mg by mouth every morning. 04/29/21  Yes [provider]  acetaminophen (TYLENOL) 500 MG tablet Take 1 tablet (500 mg total) by mouth every 6 (six) hours as needed. Patient not taking: No sig reported 12/10/19  Horton, Barbette Hair, MD  amLODipine (NORVASC) 10 MG tablet Take 1 tablet (10 mg total) by mouth daily. Patient not taking: No sig reported 02/26/21   Medina-Vargas, Monina C, NP  apixaban (ELIQUIS) 5 MG TABS tablet Take 1 tablet (5 mg total) by mouth 2 (two) times daily. 02/26/21   Medina-Vargas, Monina C, NP  DULoxetine (CYMBALTA) 30 MG capsule Take 1 capsule (30 mg total) by mouth daily. Patient not taking: Reported on 05/22/2021 02/26/21   Medina-Vargas, Monina C, NP  gabapentin (NEURONTIN) 300 MG capsule Take 1 capsule (300 mg total) by mouth 2 (two) times daily. Patient not taking: Reported on 05/22/2021 02/26/21   Medina-Vargas, Monina C,  NP  metFORMIN (GLUCOPHAGE) 500 MG tablet Take 2 tablets (1,000 mg total) by mouth daily. Patient not taking: No sig reported 02/26/21   Medina-Vargas, Monina C, NP    Medications:  insulin aspart  0-9 Units Subcutaneous TID WC    Assessment/Plan SBO -moving her bowels with + flatus -8 hr delay shows contrast throughout the colon -Dc NGT and give CLD -mobilize and pulm toilet   FEN:CLD ID: None DVT:IV heparin   Hypertension Type 2 diabetes with diabetic neuropathy Peripheral neuropathy Autoimmune skin disease Rheumatoid arthritis Hx of DVT - on Eliquis    LOS: 3 days    Henreitta Cea 05/25/2021 Please see Amion

## 2021-05-26 DIAGNOSIS — K56609 Unspecified intestinal obstruction, unspecified as to partial versus complete obstruction: Secondary | ICD-10-CM | POA: Diagnosis not present

## 2021-05-26 LAB — GLUCOSE, CAPILLARY
Glucose-Capillary: 121 mg/dL — ABNORMAL HIGH (ref 70–99)
Glucose-Capillary: 103 mg/dL — ABNORMAL HIGH (ref 70–99)
Glucose-Capillary: 178 mg/dL — ABNORMAL HIGH (ref 70–99)
Glucose-Capillary: 75 mg/dL (ref 70–99)
Glucose-Capillary: 127 mg/dL — ABNORMAL HIGH (ref 70–99)

## 2021-05-26 MED ORDER — DOCUSATE SODIUM 100 MG PO CAPS
100.0000 mg | ORAL_CAPSULE | Freq: Two times a day (BID) | ORAL | Status: DC
  Filled 2021-05-26: qty 1

## 2021-05-26 MED ORDER — APIXABAN 5 MG PO TABS: 5.0000 mg | ORAL_TABLET | Freq: Two times a day (BID) | ORAL | Status: DC

## 2021-05-26 MED ORDER — TIZANIDINE HCL 2 MG PO TABS
4.0000 mg | ORAL_TABLET | Freq: Every morning | ORAL | Status: DC
  Administered 2021-05-26 – 2021-05-27 (×2): 4 mg via ORAL
  Filled 2021-05-26 (×2): qty 2

## 2021-05-26 MED ORDER — ENOXAPARIN SODIUM 100 MG/ML IJ SOSY
100.0000 mg | PREFILLED_SYRINGE | Freq: Two times a day (BID) | INTRAMUSCULAR | Status: AC
Start: 2021-05-26 — End: 2021-05-26
  Administered 2021-05-26 (×2): 100 mg via SUBCUTANEOUS
  Filled 2021-05-26 (×2): qty 1

## 2021-05-26 MED ORDER — POLYETHYLENE GLYCOL 3350 17 G PO PACK
17.0000 g | PACK | Freq: Two times a day (BID) | ORAL | Status: DC
  Filled 2021-05-26: qty 1

## 2021-05-26 MED ORDER — DULOXETINE HCL 20 MG PO CPEP
20.0000 mg | ORAL_CAPSULE | Freq: Every morning | ORAL | Status: DC
  Administered 2021-05-26 – 2021-05-27 (×2): 20 mg via ORAL
  Filled 2021-05-26 (×2): qty 1

## 2021-05-26 NOTE — Progress Notes (Signed)
Physical Therapy Treatment Patient Details Name: Diane [name] MRN: [id number] DOB: [birth year] Today's Date: 05/26/2021    History of Present Illness 75 yo female with onset of abd and distension had another SBO and  was readmitted, has just had NG tube clamped.  Has received enema, now permitted mobility but needs to be near BR.  PMHx:  DVT, SBO, RA, DM, GERD, HTN, cholecystectomy, hernia repair    PT Comments    Pt supine in bed this session.  She continues to improve.  Plan for stair training in am as patient has hopes of d/c in am.     Follow Up Recommendations  Home health PT;Supervision for mobility/OOB     Equipment Recommendations  Rolling walker with 5" wheels    Recommendations for Other Services       Precautions / Restrictions Precautions Precautions: Fall Precaution Comments: urgency issues GI due to tx of SBO Restrictions Weight Bearing Restrictions: No    Mobility  Bed Mobility Overal bed mobility: Needs Assistance Bed Mobility: Supine to Sit;Sit to Supine     Supine to sit: Supervision Sit to supine: Supervision        Transfers Overall transfer level: Needs assistance Equipment used: Rolling walker (2 wheeled) Transfers: Sit to/from Stand Sit to Stand: Min assist         General transfer comment: min A to power up, cues for hand placement to avoid pulling on RW.  Ambulation/Gait Ambulation/Gait assistance: Supervision Gait Distance (Feet): 120 Feet Assistive device: Rolling walker (2 wheeled) Gait Pattern/deviations: Step-through pattern;Decreased stride length;Wide base of support Gait velocity: reduced   General Gait Details: Cues for RW safety and pacing.   Stairs             Wheelchair Mobility    Modified Rankin (Stroke Patients Only)       Balance Overall balance assessment: Needs assistance   Sitting balance-Leahy Scale: Good       Standing balance-Leahy Scale: Fair                               Cognition Arousal/Alertness: Awake/alert Behavior During Therapy: WFL for tasks assessed/performed Overall Cognitive Status: Within Functional Limits for tasks assessed                                        Exercises      General Comments        Pertinent Vitals/Pain Pain Assessment: No/denies pain    Home Living                      Prior Function            PT Goals (current goals can now be found in the care plan section) Acute Rehab PT Goals Patient Stated Goal: to get home and feel better Potential to Achieve Goals: Good Progress towards PT goals: Progressing toward goals    Frequency    Min 3X/week      PT Plan Current plan remains appropriate    Co-evaluation              AM-PAC PT "6 Clicks" Mobility   Outcome Measure  Help needed turning from your back to your side while in a flat bed without using bedrails?: A Little Help needed moving from lying on your back to  sitting on the side of a flat bed without using bedrails?: A Little Help needed moving to and from a bed to a chair (including a wheelchair)?: A Little Help needed standing up from a chair using your arms (e.g., wheelchair or bedside chair)?: A Little Help needed to walk in hospital room?: A Little Help needed climbing 3-5 steps with a railing? : A Little 6 Click Score: 18    End of Session Equipment Utilized During Treatment: Gait belt Activity Tolerance: Patient tolerated treatment well;Patient limited by fatigue Patient left: in chair;with call bell/phone within reach;with chair alarm set Nurse Communication: Mobility status PT Visit Diagnosis: Unsteadiness on feet (R26.81);Muscle weakness (generalized) (M62.81);Difficulty in walking, not elsewhere classified (R26.2)     Time: [id number] PT Time Calculation (min) (ACUTE ONLY): 28 min  Charges:  $Gait Training: 8-22 mins $Therapeutic Activity: 8-22 mins                     Diane [name] , PTA Acute  Rehabilitation Services Pager [phone] Office [phone]    Diane [name] 05/26/2021, 5:21 PM

## 2021-05-26 NOTE — Progress Notes (Signed)
Patient refusing all lab draws. States she does not want them done anymore.

## 2021-05-26 NOTE — Progress Notes (Signed)
[facility] Progress Note  Patient: Diane [name]    [id number]  DOA: 05/22/2021     Date of Service: the patient was seen and examined on 05/26/2021  Brief hospital course: Past medical history of HTN, type II DM, anxiety, depression, pulmonary arthritis, DVT, PVD.  Presents with complaints of intractable nausea and vomiting.  Found to have partial SBO.  Treated conservatively. General surgery consulted. NG tube removed on 7/25. Currently plan is monitor for improvement in abdominal pain as well as tolerance of oral diet.  Subjective: Continues to have right abdominal pain.  No nausea no vomiting.  Passing gas and having bowel movement without any blood. Tolerating clear liquid diet and increasing abdominal pain. No shortness of breath.  No dizziness or lightheadedness.  Assessment and Plan: 1.  Partial SBO Constipation History of umbilical hernia repair Hysterectomy SP small bowel protocol.  Contrast in the colon. NG tube removed. Tolerating clear liquid diet.  Surgery advancing to soft diet. Monitor for improvement.  Patient still symptomatic. Continue with IV fluids. Suspect chronic constipation as a cause for SBO. Will initiate bowel regimen and monitor.  2.  Hypokalemia Replacing.  Monitor.  3.  History of DVT Was on IV heparin. Switch to Lovenox. Will transition to oral Eliquis tomorrow.  4.  Rheumatoid arthritis On methotrexate. Currently on hold.  5.  Type 2 diabetes mellitus, uncontrolled with hypoglycemia without long-term insulin use and complication. On metformin currently on hold. Currently continuing sliding scale insulin.  6.  History of depression On Cymbalta. Currently on hold.  Scheduled Meds:  [START ON 05/27/2021] apixaban  5 mg Oral BID   docusate sodium  100 mg Oral BID   DULoxetine  20 mg Oral q morning   enoxaparin (LOVENOX) injection  100 mg Subcutaneous Q12H   insulin aspart  0-9 Units Subcutaneous QID   polyethylene glycol  17  g Oral BID   tiZANidine  4 mg Oral q morning   Continuous Infusions:  0.9 % NaCl with KCl 20 mEq / L 50 mL/hr at 05/26/21 0950   PRN Meds: ketorolac, oxyCODONE, promethazine  Body mass index is 31.93 kg/m.        DVT Prophylaxis: Subcutaneous Lovenox   apixaban (ELIQUIS) tablet 5 mg    Advance goals of care discussion: Pt is Full code.  Family Communication: no family was present at bedside, at the time of interview.   Data Reviewed: I have personally reviewed and interpreted daily labs, tele strips, imaging. CBG improving. Labs ordered but so far have not been performed  Physical Exam:  General: Appear in mild distress, no Rash; Oral Mucosa Clear, moist. no Abnormal Neck Mass Or lumps, Conjunctiva normal  Cardiovascular: S1 and S2 Present, no Murmur, Respiratory: good respiratory effort, Bilateral Air entry present and CTA, no Crackles, no wheezes Abdomen: Bowel Sound present, Soft and right-sided tenderness Extremities: no Pedal edema Neurology: alert and oriented to time, place, and person affect appropriate. no new focal deficit Gait not checked due to patient safety concerns   Vitals:   05/26/21 0529 05/26/21 0726 05/26/21 1223 05/26/21 1647  BP: (!) 188/91 (!) 155/76 139/77 (!) 160/86  Pulse: 75 72 78 78  Resp: '17 16 18 16  '$ Temp: 99 F (37.2 C) 98.7 F (37.1 C) 98.5 F (36.9 C) 98.4 F (36.9 C)  TempSrc: Oral Oral Oral Oral  SpO2: 99% 100% 100% 98%  Weight:      Height:        Disposition:  Status  is: Inpatient  Remains inpatient appropriate because:IV treatments appropriate due to intensity of illness or inability to take PO  Dispo: The patient is from: Home              Anticipated d/c is to: Home              Patient currently is not medically stable to d/c.   Difficult to place patient No  Time spent: 35 minutes. I reviewed all nursing notes, pharmacy notes, vitals, pertinent old records. I have discussed plan of care as described above with  RN.  Author: Berle Mull, MD [facility] 05/26/2021 4:50 PM  To reach On-call, see care teams to locate the attending and reach out via [email or web address]. Between 7PM-7AM, please contact night-coverage If you still have difficulty reaching the attending provider, please page the [facility] (Director on Call) for [facility] on amion for assistance.

## 2021-05-26 NOTE — Progress Notes (Signed)
Patient refusing all lab draws. States she does not want them anymore. Patel MD made aware of patients request.

## 2021-05-26 NOTE — Progress Notes (Signed)
CC: Abdominal pain  Subjective: Tolerating CLD with no nausea.  Still with some abdominal tenderness.  Still with flatus and some BMs  Objective: Vital signs in last 24 hours: Temp:  [98.5 F (36.9 C)-99.1 F (37.3 C)] 98.7 F (37.1 C) (07/26 0726) Pulse Rate:  [72-92] 72 (07/26 0726) Resp:  [16-18] 16 (07/26 0726) BP: (155-188)/(76-91) 155/76 (07/26 0726) SpO2:  [99 %-100 %] 100 % (07/26 0726) Last BM Date: 05/25/21 N.p.o. 1200 IV Voided x1 recorded 550 NG BM x3 T-max 99.3, other vital signs are stable K+ 4.3, magnesium 1.9, WBC 8.6 Intake/Output from previous day: 07/25 0701 - 07/26 0700 In: 2106.5 [P.O.:940; I.V.:1166.5] Out: 6 [Urine:3; Stool:3] Intake/Output this shift: No intake/output data recorded.  Abd: soft, still tender more on the right than left, slightly more than expected, but no peritonitis or rebounding or guarding, +BS, obese, not really distended.    Lab Results:  Recent Labs    05/24/21 0425  WBC 8.6  HGB 11.4*  HCT 37.9  PLT 184    BMET Recent Labs    05/24/21 0425  NA 141  K 4.3  CL 111  CO2 21*  GLUCOSE 133*  BUN 21  CREATININE 0.67  CALCIUM 8.7*   PT/INR No results for input(s): LABPROT, INR in the last 72 hours.  Recent Labs  Lab 05/22/21 1054 05/23/21 0201  AST 27 17  ALT 22 17  ALKPHOS 68 54  BILITOT 0.6 0.8  PROT 7.8 6.8  ALBUMIN 4.0 3.4*     Lipase     Component Value Date/Time   LIPASE 27 05/22/2021 1054     Prior to Admission medications   Medication Sig Start Date End Date Taking? Authorizing Provider  amLODipine (NORVASC) 5 MG tablet Take 5 mg by mouth every morning. 05/13/21  Yes [provider]  aspirin EC 81 MG tablet Take 162 mg by mouth daily as needed (pain/headache). Swallow whole.   Yes [provider]  clotrimazole-betamethasone (LOTRISONE) cream Apply 1 application topically 2 (two) times daily. Apply to feet   Yes [provider]  docusate sodium (COLACE) 100  MG capsule Take 100 mg by mouth daily.   Yes [provider]  DULoxetine (CYMBALTA) 20 MG capsule Take 20 mg by mouth every morning. 04/30/21  Yes [provider]  folic acid (FOLVITE) 1 MG tablet Take 1 tablet (1 mg total) by mouth daily. 02/26/21  Yes Medina-Vargas, Monina C, NP  metFORMIN (GLUCOPHAGE-XR) 500 MG 24 hr tablet Take 1 tablet by mouth 2 (two) times daily with a meal. 01/03/21  Yes [provider]  methotrexate (RHEUMATREX) 2.5 MG tablet Take 8 tablets (20 mg total) by mouth once a week. 8 tablets weekly (Sunday) Patient taking differently: Take 20 mg by mouth every Sunday. 02/26/21  Yes Medina-Vargas, Monina C, NP  polyethylene glycol (MIRALAX / GLYCOLAX) 17 g packet Take 17 g by mouth daily. Patient taking differently: Take 17 g by mouth every other day. 02/26/21  Yes Medina-Vargas, Monina C, NP  rosuvastatin (CRESTOR) 10 MG tablet Take 1 tablet (10 mg total) by mouth daily. 02/26/21  Yes Medina-Vargas, Monina C, NP  tiZANidine (ZANAFLEX) 4 MG tablet Take 4 mg by mouth every morning. 04/29/21  Yes [provider]  acetaminophen (TYLENOL) 500 MG tablet Take 1 tablet (500 mg total) by mouth every 6 (six) hours as needed. Patient not taking: No sig reported 12/10/19   Horton, Barbette Hair, MD  amLODipine (NORVASC) 10 MG tablet Take  1 tablet (10 mg total) by mouth daily. Patient not taking: No sig reported 02/26/21   Medina-Vargas, Monina C, NP  apixaban (ELIQUIS) 5 MG TABS tablet Take 1 tablet (5 mg total) by mouth 2 (two) times daily. 02/26/21   Medina-Vargas, Monina C, NP  DULoxetine (CYMBALTA) 30 MG capsule Take 1 capsule (30 mg total) by mouth daily. Patient not taking: Reported on 05/22/2021 02/26/21   Medina-Vargas, Monina C, NP  gabapentin (NEURONTIN) 300 MG capsule Take 1 capsule (300 mg total) by mouth 2 (two) times daily. Patient not taking: Reported on 05/22/2021 02/26/21   Medina-Vargas, Monina C, NP  metFORMIN (GLUCOPHAGE) 500 MG tablet Take 2 tablets  (1,000 mg total) by mouth daily. Patient not taking: No sig reported 02/26/21   Medina-Vargas, Monina C, NP    Medications:  [START ON 05/27/2021] apixaban  5 mg Oral BID   docusate sodium  100 mg Oral BID   DULoxetine  20 mg Oral q morning   enoxaparin (LOVENOX) injection  100 mg Subcutaneous Q12H   insulin aspart  0-9 Units Subcutaneous QID   polyethylene glycol  17 g Oral BID   tiZANidine  4 mg Oral q morning    Assessment/Plan SBO -moving her bowels with + flatus -adv to soft diet today and see how she does -would like to watch today given abdomen is still tender and make sure she tolerates her diet and remains stable.  D/W primary service. -mobilize and pulm toilet   FEN: soft diet ID: None DVT:IV heparin   Hypertension Type 2 diabetes with diabetic neuropathy Peripheral neuropathy Autoimmune skin disease Rheumatoid arthritis Hx of DVT - on Eliquis    LOS: 4 days    Henreitta Cea 05/26/2021 Please see Amion

## 2021-05-27 ENCOUNTER — Inpatient Hospital Stay (HOSPITAL_COMMUNITY): Payer: Medicare Other

## 2021-05-27 LAB — CBC
HCT: 35.5 % — ABNORMAL LOW (ref 36.0–46.0)
Hemoglobin: 10.9 g/dL — ABNORMAL LOW (ref 12.0–15.0)
MCH: 26.1 pg (ref 26.0–34.0)
MCHC: 30.7 g/dL (ref 30.0–36.0)
MCV: 84.9 fL (ref 80.0–100.0)
Platelets: 223 10*3/uL (ref 150–400)
RBC: 4.18 MIL/uL (ref 3.87–5.11)
RDW: 15.9 % — ABNORMAL HIGH (ref 11.5–15.5)
WBC: 5.1 10*3/uL (ref 4.0–10.5)
nRBC: 0 % (ref 0.0–0.2)

## 2021-05-27 LAB — BASIC METABOLIC PANEL
Anion gap: 9 (ref 5–15)
BUN: <5 mg/dL — ABNORMAL LOW (ref 8–23)
CO2: 23 mmol/L (ref 22–32)
Calcium: 8.9 mg/dL (ref 8.9–10.3)
Chloride: 106 mmol/L (ref 98–111)
Creatinine, Ser: 0.69 mg/dL (ref 0.44–1.00)
GFR, Estimated: >60 mL/min (ref >60)
Glucose, Bld: 168 mg/dL — ABNORMAL HIGH (ref 70–99)
Potassium: 3.3 mmol/L — ABNORMAL LOW (ref 3.5–5.1)
Sodium: 138 mmol/L (ref 135–145)

## 2021-05-27 LAB — GLUCOSE, CAPILLARY
Glucose-Capillary: 103 mg/dL — ABNORMAL HIGH (ref 70–99)
Glucose-Capillary: 154 mg/dL — ABNORMAL HIGH (ref 70–99)
Glucose-Capillary: 177 mg/dL — ABNORMAL HIGH (ref 70–99)
Glucose-Capillary: 119 mg/dL — ABNORMAL HIGH (ref 70–99)

## 2021-05-27 IMAGING — DX DG ABD PORTABLE 1V
1 series · 1 of 1 positions shown · non-contrast
Comparison: May 24, 2021.

CLINICAL DATA: Ileus.

EXAM:
PORTABLE ABDOMEN - 1 VIEW

[abdomen]
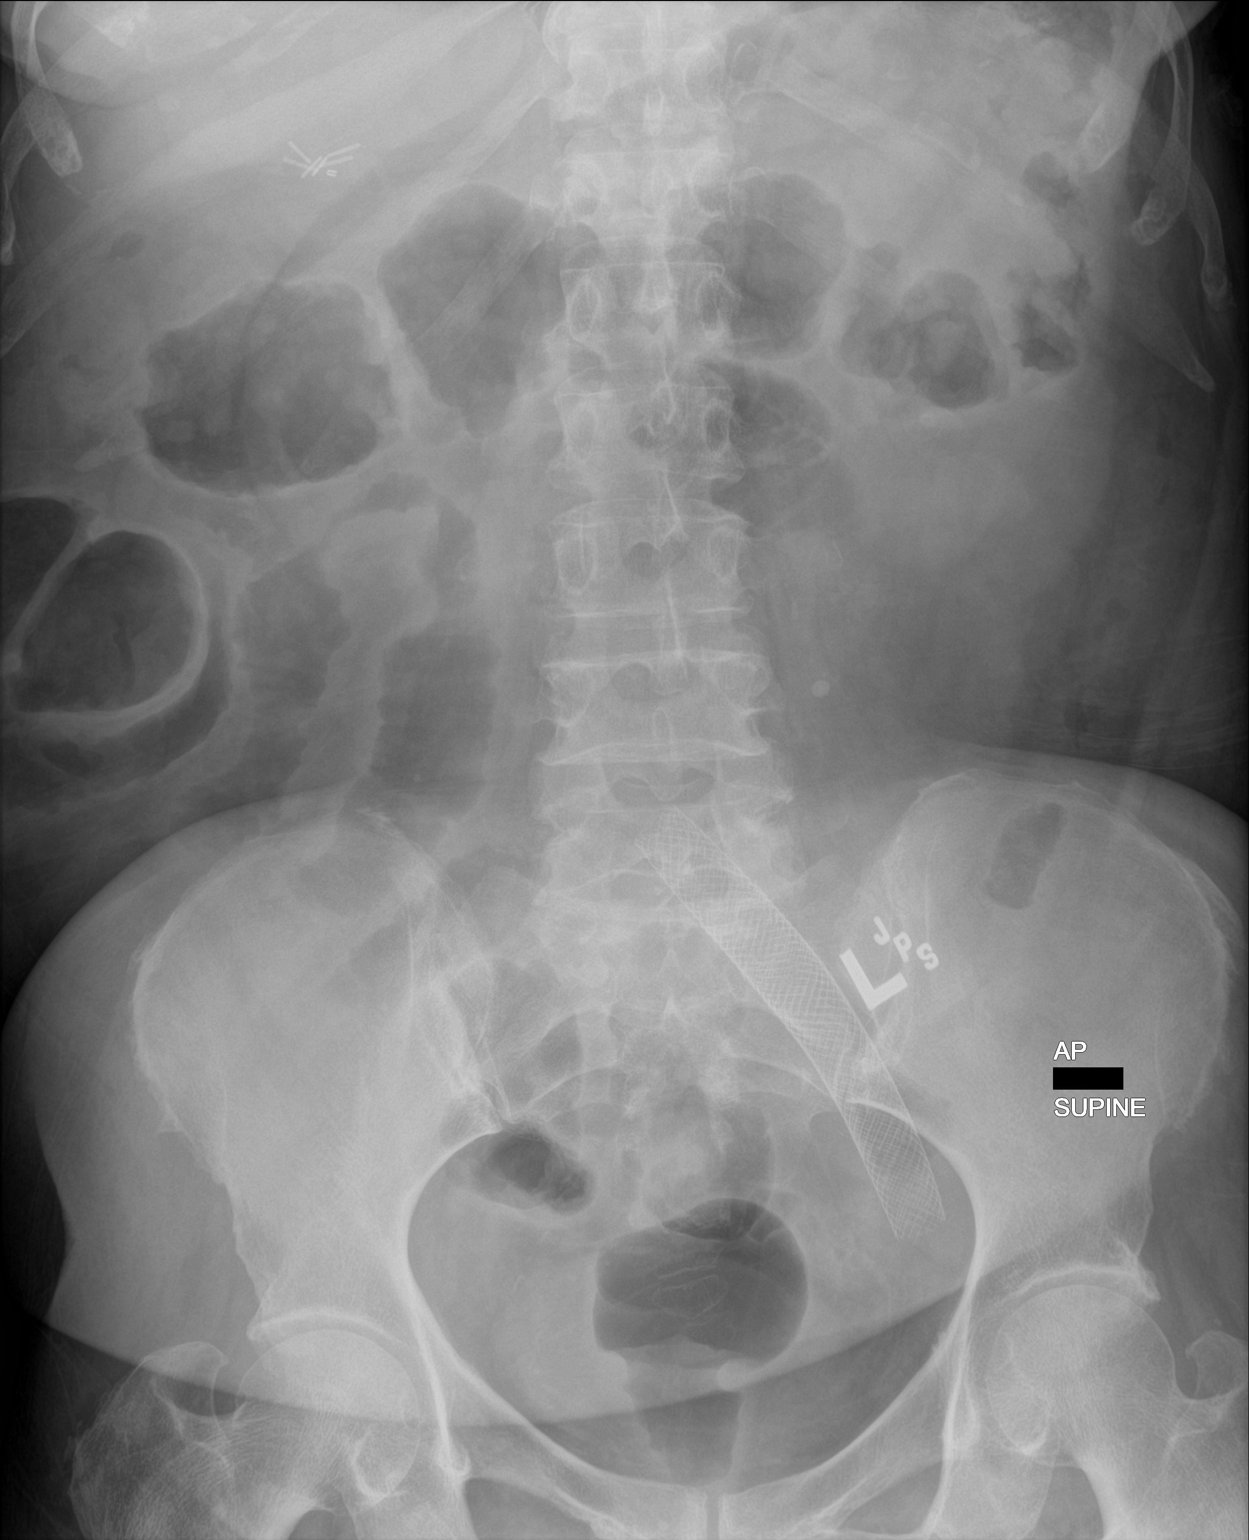

[1 of 1 positions shown; findings below may reference images not displayed]

FINDINGS: Post cholecystectomy.

Stenting overlying the LEFT pelvis as before.

Gas in the colon to the level of the rectum. Small bowel gas in the
RIGHT hemiabdomen likely distal ileum. Question of fold thickening
of the ileum.

On limited assessment no acute regional skeletal process.
Calcifications over LEFT psoas compatible with phleboliths in the
ovarian vein.
IMPRESSION: Signs of mild ileus. Question enteritis. Perhaps slightly less
distension of proximal small bowel loops since the prior study.

## 2021-05-27 MED ORDER — APIXABAN 5 MG PO TABS
5.0000 mg | ORAL_TABLET | Freq: Two times a day (BID) | ORAL | Status: DC
  Administered 2021-05-27: 5 mg via ORAL
  Filled 2021-05-27: qty 1

## 2021-05-27 MED ORDER — SIMETHICONE 80 MG PO CHEW
80.0000 mg | CHEWABLE_TABLET | Freq: Four times a day (QID) | ORAL | Status: DC
  Administered 2021-05-27 (×3): 80 mg via ORAL
  Filled 2021-05-27 (×3): qty 1

## 2021-05-27 MED ORDER — PROCHLORPERAZINE EDISYLATE 10 MG/2ML IJ SOLN: 10.0000 mg | Freq: Four times a day (QID) | INTRAMUSCULAR | Status: DC | PRN

## 2021-05-27 MED ORDER — LOSARTAN POTASSIUM 50 MG PO TABS: 50.0000 mg | ORAL_TABLET | Freq: Every day | ORAL | 0 refills | Status: DC

## 2021-05-27 MED ORDER — DOCUSATE SODIUM 100 MG PO CAPS: 100.0000 mg | ORAL_CAPSULE | Freq: Two times a day (BID) | ORAL | 0 refills | Status: DC

## 2021-05-27 MED ORDER — LACTULOSE 10 GM/15ML PO SOLN
30.0000 g | Freq: Once | ORAL | Status: DC
  Filled 2021-05-27: qty 45

## 2021-05-27 MED ORDER — SIMETHICONE 80 MG PO CHEW: 80.0000 mg | CHEWABLE_TABLET | Freq: Four times a day (QID) | ORAL | 0 refills | Status: DC

## 2021-05-27 MED ORDER — ACETAMINOPHEN 325 MG PO TABS: 650.0000 mg | ORAL_TABLET | Freq: Four times a day (QID) | ORAL | Status: DC | PRN

## 2021-05-27 MED ORDER — POLYETHYLENE GLYCOL 3350 17 G PO PACK: 17.0000 g | PACK | Freq: Two times a day (BID) | ORAL | 0 refills | Status: DC

## 2021-05-27 MED ORDER — POTASSIUM CHLORIDE CRYS ER 20 MEQ PO TBCR
40.0000 meq | EXTENDED_RELEASE_TABLET | Freq: Once | ORAL | Status: AC
  Administered 2021-05-27: 40 meq via ORAL
  Filled 2021-05-27: qty 2

## 2021-05-27 NOTE — Progress Notes (Signed)
CC: Abdominal pain  Subjective: Tolerating soft diet with no nausea or vomiting.  Moving her bowels still some.  C/o bulge in RUQ and that is still where she has some pain.  Objective: Vital signs in last 24 hours: Temp:  [98.2 F (36.8 C)-98.5 F (36.9 C)] 98.3 F (36.8 C) (07/27 0756) Pulse Rate:  [70-84] 75 (07/27 0756) Resp:  [16-18] 16 (07/27 0756) BP: (139-160)/(66-96) 142/72 (07/27 0756) SpO2:  [97 %-100 %] 98 % (07/27 0756) Last BM Date: 05/27/21  Intake/Output from previous day: 07/26 0701 - 07/27 0700 In: 1193.8 [P.O.:120; I.V.:1073.8] Out: -  Intake/Output this shift: No intake/output data recorded.  Abd: soft, still tender some on right side, mostly in RUQ, but less today, +BS, ND  Lab Results:  No results for input(s): WBC, HGB, HCT, PLT in the last 72 hours.   BMET No results for input(s): NA, K, CL, CO2, GLUCOSE, BUN, CREATININE, CALCIUM in the last 72 hours.  PT/INR No results for input(s): LABPROT, INR in the last 72 hours.  Recent Labs  Lab 05/22/21 1054 05/23/21 0201  AST 27 17  ALT 22 17  ALKPHOS 68 54  BILITOT 0.6 0.8  PROT 7.8 6.8  ALBUMIN 4.0 3.4*     Lipase     Component Value Date/Time   LIPASE 27 05/22/2021 1054     Prior to Admission medications   Medication Sig Start Date End Date Taking? Authorizing Provider  amLODipine (NORVASC) 5 MG tablet Take 5 mg by mouth every morning. 05/13/21  Yes [provider]  aspirin EC 81 MG tablet Take 162 mg by mouth daily as needed (pain/headache). Swallow whole.   Yes [provider]  clotrimazole-betamethasone (LOTRISONE) cream Apply 1 application topically 2 (two) times daily. Apply to feet   Yes [provider]  docusate sodium (COLACE) 100 MG capsule Take 100 mg by mouth daily.   Yes [provider]  DULoxetine (CYMBALTA) 20 MG capsule Take 20 mg by mouth every morning. 04/30/21  Yes [provider]  folic acid (FOLVITE) 1 MG tablet Take 1  tablet (1 mg total) by mouth daily. 02/26/21  Yes Medina-Vargas, Monina C, NP  metFORMIN (GLUCOPHAGE-XR) 500 MG 24 hr tablet Take 1 tablet by mouth 2 (two) times daily with a meal. 01/03/21  Yes [provider]  methotrexate (RHEUMATREX) 2.5 MG tablet Take 8 tablets (20 mg total) by mouth once a week. 8 tablets weekly (Sunday) Patient taking differently: Take 20 mg by mouth every Sunday. 02/26/21  Yes Medina-Vargas, Monina C, NP  polyethylene glycol (MIRALAX / GLYCOLAX) 17 g packet Take 17 g by mouth daily. Patient taking differently: Take 17 g by mouth every other day. 02/26/21  Yes Medina-Vargas, Monina C, NP  rosuvastatin (CRESTOR) 10 MG tablet Take 1 tablet (10 mg total) by mouth daily. 02/26/21  Yes Medina-Vargas, Monina C, NP  tiZANidine (ZANAFLEX) 4 MG tablet Take 4 mg by mouth every morning. 04/29/21  Yes [provider]  acetaminophen (TYLENOL) 500 MG tablet Take 1 tablet (500 mg total) by mouth every 6 (six) hours as needed. Patient not taking: No sig reported 12/10/19   Horton, Barbette Hair, MD  amLODipine (NORVASC) 10 MG tablet Take 1 tablet (10 mg total) by mouth daily. Patient not taking: No sig reported 02/26/21   Medina-Vargas, Monina C, NP  apixaban (ELIQUIS) 5 MG TABS tablet Take 1 tablet (5 mg total) by mouth 2 (two) times daily. 02/26/21   Medina-Vargas, Senaida Lange, NP  DULoxetine (CYMBALTA) 30 MG capsule Take 1 capsule (30 mg total) by mouth daily. Patient not taking: Reported on 05/22/2021 02/26/21   Medina-Vargas, Monina C, NP  gabapentin (NEURONTIN) 300 MG capsule Take 1 capsule (300 mg total) by mouth 2 (two) times daily. Patient not taking: Reported on 05/22/2021 02/26/21   Medina-Vargas, Monina C, NP  metFORMIN (GLUCOPHAGE) 500 MG tablet Take 2 tablets (1,000 mg total) by mouth daily. Patient not taking: No sig reported 02/26/21   Medina-Vargas, Monina C, NP    Medications:  apixaban  5 mg Oral BID   docusate sodium  100 mg Oral BID   DULoxetine  20 mg Oral q morning    insulin aspart  0-9 Units Subcutaneous QID   lactulose  30 g Oral Once   polyethylene glycol  17 g Oral BID   simethicone  80 mg Oral QID   tiZANidine  4 mg Oral q morning    Assessment/Plan SBO -moving her bowels with + flatus -tolerating soft diet with no issues -review of patient's CT scan shows absent gallbladder and that her cecum was dilated on admission with liquid/solid stool present.  Repeat KUB today shows this area is still slightly more air filled than others and likely what she is feeling on exam.  I do not see any evidence of recurrence of her obstruction.  She has chronic constipation issues which we discussed can cause pain at times.  We discussed the need for aggressive bowel regimen at home as CT scan was more concerning to me for significant constipation issues over definite obstruction from adhesive disease, although she had previous surgery so this could be contributing some as well.  She understood and was reassured. -d/w primary service.  Patient is surgically stable for DC home at this time.   FEN: soft diet ID: None DVT: resume eliquis   Hypertension Type 2 diabetes with diabetic neuropathy Peripheral neuropathy Autoimmune skin disease Rheumatoid arthritis Hx of DVT - on Eliquis    LOS: 5 days    Henreitta Cea 05/27/2021 Please see Amion

## 2021-05-27 NOTE — TOC Transition Note (Signed)
Transition of Care [facility]) - CM/SW Discharge Note   Patient Details  Name: Diane [name] MRN: [id number] Date of Birth: 09/23/1946  Transition of Care [facility]) CM/SW Contact:  Zenon Mayo, RN Phone Number: 05/27/2021, 10:58 AM   Clinical Narrative:    NCM spoke with patient at bedside, offered choice for HHPT, she does not have a preference, NCM made referral to [facility] with [facility], he is able to take referral.  Soc will begin 24 to 48 hrs post dc. She states she does not need a walker she has one at home. She will have transport at dc.   Final next level of care: [place] Barriers to Discharge: No Barriers Identified   Patient Goals and CMS Choice Patient states their goals for this hospitalization and ongoing recovery are:: return home with [facility] CMS Medicare.gov Compare Post Acute Care list provided to:: Patient Choice offered to / list presented to : Patient  Discharge Placement                       Discharge Plan and Services                  DME Agency: NA       HH Arranged: PT HH Agency: [place] Date [place]: 05/27/21 Time [place]: [id number] Representative spoke with at [place]: [place] ([place]) Interventions     Readmission Risk Interventions No flowsheet data found.

## 2021-05-27 NOTE — Progress Notes (Signed)
Occupational Therapy Treatment Patient Details Name: Diane [name] MRN: [id number] DOB: November 04, 1945 Today's Date: 05/27/2021    History of present illness 75 yo female with onset of abd and distension had another SBO and  was readmitted, has just had NG tube clamped.  Has received enema, now permitted mobility but needs to be near BR.  PMHx:  DVT, SBO, RA, DM, GERD, HTN, cholecystectomy, hernia repair   OT comments  Pt is progressing well towards her goals with plans to d/c home today. Pt completed ADLs and functional mobility with rw at a supervision/set up level this session. Pt would benefit from continued OT acutely should her length of stay continue. D/c plan remains appropriate.    Follow Up Recommendations  No OT follow up;Supervision/Assistance - 24 hour    Equipment Recommendations  None recommended by OT       Precautions / Restrictions Precautions Precautions: Fall Precaution Comments: urgency issues GI due to tx of SBO Restrictions Weight Bearing Restrictions: No       Mobility Bed Mobility Overal bed mobility: Modified Independent             General bed mobility comments: seated edge of bed on arrival.    Transfers Overall transfer level: Needs assistance Equipment used: Rolling walker (2 wheeled) Transfers: Sit to/from Stand Sit to Stand: Supervision         General transfer comment: supervision for safety    Balance Overall balance assessment: Needs assistance Sitting-balance support: Feet supported Sitting balance-Leahy Scale: Good       Standing balance-Leahy Scale: Fair         ADL either performed or assessed with clinical judgement   ADL Overall ADL's : Needs assistance/impaired     Grooming: Applying deodorant;Set up;Sitting           Upper Body Dressing : Set up;Sitting   Lower Body Dressing: Set up;Sit to/from stand   Toilet Transfer: Supervision/safety;RW;Ambulation           Functional mobility during ADLs:  Supervision/safety;Rolling walker General ADL Comments: Pt completed ADL in preparation to d/c home. Comlpeted all at supervision - set up level. no LOB or incrased pain      Cognition Arousal/Alertness: Awake/alert Behavior During Therapy: WFL for tasks assessed/performed Overall Cognitive Status: Within Functional Limits for tasks assessed              General Comments no new concerns - MD approved for discharge at beginning of sesssion    Pertinent Vitals/ Pain       Pain Assessment: No/denies pain Faces Pain Scale: Hurts a little bit Pain Location: abdominal discomfort Pain Descriptors / Indicators: Guarding;Grimacing Pain Intervention(s): Monitored during session   Frequency  Min 2X/week        Progress Toward Goals  OT Goals(current goals can now be found in the care plan section)  Progress towards OT goals: Progressing toward goals  Acute Rehab OT Goals Patient Stated Goal: to get home and feel better OT Goal Formulation: With patient Time For Goal Achievement: 06/08/21 Potential to Achieve Goals: Fair ADL Goals Pt Will Perform Tub/Shower Transfer: with supervision;ambulating;shower seat;rolling walker Additional ADL Goal #1: Pt will complete at  BADLs at a supervision level with DME as needed to prepare for safe d/c home  Plan Discharge plan remains appropriate       AM-PAC OT "6 Clicks" Daily Activity     Outcome Measure   Help from another person eating meals?: None Help from another person  taking care of personal grooming?: A Little Help from another person toileting, which includes using toliet, bedpan, or urinal?: A Little Help from another person bathing (including washing, rinsing, drying)?: A Little Help from another person to put on and taking off regular upper body clothing?: None Help from another person to put on and taking off regular lower body clothing?: A Little 6 Click Score: 20    End of Session Equipment Utilized During Treatment:  Rolling walker  OT Visit Diagnosis: Unsteadiness on feet (R26.81);Other abnormalities of gait and mobility (R26.89);Muscle weakness (generalized) (M62.81);Pain   Activity Tolerance Patient tolerated treatment well;No increased pain   Patient Left in chair;with call bell/phone within reach   Nurse Communication Mobility status        Time: [id number] OT Time Calculation (min): 15 min  Charges: OT General Charges $OT Visit: 1 Visit OT Treatments $Self Care/Home Management : 8-22 mins     Lenox Ladouceur A Isadore Bokhari 05/27/2021, 4:19 PM

## 2021-05-27 NOTE — Progress Notes (Signed)
Physical Therapy Treatment Patient Details Name: Diane [name] MRN: [id number] DOB: 02/23/1946 Today's Date: 05/27/2021    History of Present Illness 75 yo female with onset of abd and distension had another SBO and  was readmitted, has just had NG tube clamped.  Has received enema, now permitted mobility but needs to be near BR.  PMHx:  DVT, SBO, RA, DM, GERD, HTN, cholecystectomy, hernia repair    PT Comments    Pt seated edge of bed focused  on gt training this session and progression to stair training.  Continue to recommend HHPT at d/c.  Pt eager to return home.     Follow Up Recommendations  Home health PT;Supervision for mobility/OOB     Equipment Recommendations  Rolling walker with 5" wheels    Recommendations for Other Services       Precautions / Restrictions Precautions Precautions: Fall Precaution Comments: urgency issues GI due to tx of SBO Restrictions Weight Bearing Restrictions: No    Mobility  Bed Mobility               General bed mobility comments: seated edge of bed on arrival.    Transfers Overall transfer level: Needs assistance Equipment used: Rolling walker (2 wheeled) Transfers: Sit to/from Stand Sit to Stand: Supervision         General transfer comment: supervision for safety  Ambulation/Gait Ambulation/Gait assistance: Supervision Gait Distance (Feet): 200 Feet   Gait Pattern/deviations: Step-through pattern;Decreased stride length;Wide base of support Gait velocity: reduced   General Gait Details: Cues for RW safety and pacing.   Stairs Stairs: Yes Stairs assistance: Min assist Stair Management: Step to pattern;One rail Left;Forwards Number of Stairs: 3 General stair comments: Cues for sequencing and assistance to negotiate 6 inch stair.  Pt required HHA on R side.  Pt more confident with each attempt.   Wheelchair Mobility    Modified Rankin (Stroke Patients Only)       Balance Overall balance assessment:  Needs assistance Sitting-balance support: Feet supported Sitting balance-Leahy Scale: Good       Standing balance-Leahy Scale: Fair                              Cognition Arousal/Alertness: Awake/alert Behavior During Therapy: WFL for tasks assessed/performed Overall Cognitive Status: Within Functional Limits for tasks assessed                                        Exercises      General Comments        Pertinent Vitals/Pain Pain Assessment: No/denies pain Faces Pain Scale: Hurts a little bit Pain Location: abdominal discomfort Pain Descriptors / Indicators: Guarding;Grimacing Pain Intervention(s): Monitored during session;Repositioned    Home Living                      Prior Function            PT Goals (current goals can now be found in the care plan section) Acute Rehab PT Goals Patient Stated Goal: to get home and feel better Potential to Achieve Goals: Good Progress towards PT goals: Progressing toward goals    Frequency    Min 3X/week      PT Plan Current plan remains appropriate    Co-evaluation  AM-PAC PT "6 Clicks" Mobility   Outcome Measure  Help needed turning from your back to your side while in a flat bed without using bedrails?: A Little Help needed moving from lying on your back to sitting on the side of a flat bed without using bedrails?: A Little Help needed moving to and from a bed to a chair (including a wheelchair)?: A Little Help needed standing up from a chair using your arms (e.g., wheelchair or bedside chair)?: A Little Help needed to walk in hospital room?: A Little Help needed climbing 3-5 steps with a railing? : A Little 6 Click Score: 18    End of Session Equipment Utilized During Treatment: Gait belt Activity Tolerance: Patient tolerated treatment well;Patient limited by fatigue Patient left: with call bell/phone within reach;in bed Nurse Communication: Mobility  status PT Visit Diagnosis: Unsteadiness on feet (R26.81);Muscle weakness (generalized) (M62.81);Difficulty in walking, not elsewhere classified (R26.2)     Time: [id number] PT Time Calculation (min) (ACUTE ONLY): 27 min  Charges:  $Gait Training: 8-22 mins $Therapeutic Activity: 8-22 mins                     Erasmo Leventhal , PTA Acute Rehabilitation Services Pager [phone] Office [phone]    Diane [name] 05/27/2021, 3:34 PM

## 2021-05-27 NOTE — Discharge Instructions (Signed)

## 2021-05-27 NOTE — Progress Notes (Signed)
Discharge teaching given to patient. Patient verbalized understanding of all teaching. IV removed. Patient currently waiting in room for son to come pick her up for discharge.

## 2021-05-28 ENCOUNTER — Other Ambulatory Visit: Payer: Self-pay

## 2021-05-30 ENCOUNTER — Emergency Department (HOSPITAL_COMMUNITY): Payer: Medicare Other

## 2021-05-30 ENCOUNTER — Inpatient Hospital Stay (HOSPITAL_COMMUNITY)
Admission: EM | Admit: 2021-05-30 | Discharge: 2021-06-05 | DRG: 272 | Disposition: A | Discharge status: Home Health | Payer: Medicare Other | Attending: Internal Medicine | Admitting: Internal Medicine

## 2021-05-30 ENCOUNTER — Encounter (HOSPITAL_COMMUNITY): Payer: Self-pay | Admitting: Emergency Medicine

## 2021-05-30 ENCOUNTER — Observation Stay (HOSPITAL_COMMUNITY): Payer: Medicare Other

## 2021-05-30 DIAGNOSIS — I2510 Atherosclerotic heart disease of native coronary artery without angina pectoris: Secondary | ICD-10-CM | POA: Diagnosis present

## 2021-05-30 DIAGNOSIS — R609 Edema, unspecified: Secondary | ICD-10-CM | POA: Diagnosis not present

## 2021-05-30 DIAGNOSIS — R9431 Abnormal electrocardiogram [ECG] [EKG]: Secondary | ICD-10-CM | POA: Diagnosis not present

## 2021-05-30 DIAGNOSIS — Z96611 Presence of right artificial shoulder joint: Secondary | ICD-10-CM | POA: Diagnosis not present

## 2021-05-30 DIAGNOSIS — R140 Abdominal distension (gaseous): Secondary | ICD-10-CM

## 2021-05-30 DIAGNOSIS — Z885 Allergy status to narcotic agent status: Secondary | ICD-10-CM

## 2021-05-30 DIAGNOSIS — E1151 Type 2 diabetes mellitus with diabetic peripheral angiopathy without gangrene: Secondary | ICD-10-CM | POA: Diagnosis not present

## 2021-05-30 DIAGNOSIS — Z86718 Personal history of other venous thrombosis and embolism: Secondary | ICD-10-CM | POA: Diagnosis not present

## 2021-05-30 DIAGNOSIS — K56609 Unspecified intestinal obstruction, unspecified as to partial versus complete obstruction: Secondary | ICD-10-CM

## 2021-05-30 DIAGNOSIS — M1990 Unspecified osteoarthritis, unspecified site: Secondary | ICD-10-CM | POA: Diagnosis not present

## 2021-05-30 DIAGNOSIS — R0602 Shortness of breath: Secondary | ICD-10-CM | POA: Diagnosis not present

## 2021-05-30 DIAGNOSIS — Z7901 Long term (current) use of anticoagulants: Secondary | ICD-10-CM | POA: Diagnosis not present

## 2021-05-30 DIAGNOSIS — E1140 Type 2 diabetes mellitus with diabetic neuropathy, unspecified: Secondary | ICD-10-CM | POA: Diagnosis not present

## 2021-05-30 DIAGNOSIS — I82402 Acute embolism and thrombosis of unspecified deep veins of left lower extremity: Secondary | ICD-10-CM | POA: Diagnosis not present

## 2021-05-30 DIAGNOSIS — I517 Cardiomegaly: Secondary | ICD-10-CM | POA: Diagnosis not present

## 2021-05-30 DIAGNOSIS — E1165 Type 2 diabetes mellitus with hyperglycemia: Secondary | ICD-10-CM | POA: Diagnosis not present

## 2021-05-30 DIAGNOSIS — I10 Essential (primary) hypertension: Secondary | ICD-10-CM | POA: Diagnosis not present

## 2021-05-30 DIAGNOSIS — Z881 Allergy status to other antibiotic agents status: Secondary | ICD-10-CM | POA: Diagnosis not present

## 2021-05-30 DIAGNOSIS — K449 Diaphragmatic hernia without obstruction or gangrene: Secondary | ICD-10-CM | POA: Diagnosis not present

## 2021-05-30 DIAGNOSIS — Z20822 Contact with and (suspected) exposure to covid-19: Secondary | ICD-10-CM | POA: Diagnosis not present

## 2021-05-30 DIAGNOSIS — Z79899 Other long term (current) drug therapy: Secondary | ICD-10-CM

## 2021-05-30 DIAGNOSIS — R079 Chest pain, unspecified: Secondary | ICD-10-CM | POA: Diagnosis not present

## 2021-05-30 DIAGNOSIS — I824Y2 Acute embolism and thrombosis of unspecified deep veins of left proximal lower extremity: Secondary | ICD-10-CM | POA: Diagnosis not present

## 2021-05-30 DIAGNOSIS — Z888 Allergy status to other drugs, medicaments and biological substances status: Secondary | ICD-10-CM

## 2021-05-30 DIAGNOSIS — I82409 Acute embolism and thrombosis of unspecified deep veins of unspecified lower extremity: Secondary | ICD-10-CM | POA: Diagnosis present

## 2021-05-30 DIAGNOSIS — K219 Gastro-esophageal reflux disease without esophagitis: Secondary | ICD-10-CM | POA: Diagnosis not present

## 2021-05-30 DIAGNOSIS — E11649 Type 2 diabetes mellitus with hypoglycemia without coma: Secondary | ICD-10-CM | POA: Diagnosis not present

## 2021-05-30 DIAGNOSIS — Z9049 Acquired absence of other specified parts of digestive tract: Secondary | ICD-10-CM | POA: Diagnosis not present

## 2021-05-30 DIAGNOSIS — J9811 Atelectasis: Secondary | ICD-10-CM | POA: Diagnosis not present

## 2021-05-30 DIAGNOSIS — Z7984 Long term (current) use of oral hypoglycemic drugs: Secondary | ICD-10-CM | POA: Diagnosis not present

## 2021-05-30 DIAGNOSIS — I82412 Acute embolism and thrombosis of left femoral vein: Principal | ICD-10-CM | POA: Diagnosis present

## 2021-05-30 DIAGNOSIS — E782 Mixed hyperlipidemia: Secondary | ICD-10-CM | POA: Diagnosis not present

## 2021-05-30 LAB — CBC WITH DIFFERENTIAL/PLATELET
Abs Immature Granulocytes: 0.03 10*3/uL (ref 0.00–0.07)
Basophils Absolute: 0 10*3/uL (ref 0.0–0.1)
Basophils Relative: 0 %
Eosinophils Absolute: 0.1 10*3/uL (ref 0.0–0.5)
Eosinophils Relative: 1 %
HCT: 33 % — ABNORMAL LOW (ref 36.0–46.0)
Hemoglobin: 10.4 g/dL — ABNORMAL LOW (ref 12.0–15.0)
Immature Granulocytes: 0 %
Lymphocytes Relative: 18 %
Lymphs Abs: 1.3 10*3/uL (ref 0.7–4.0)
MCH: 27.2 pg (ref 26.0–34.0)
MCHC: 31.5 g/dL (ref 30.0–36.0)
MCV: 86.4 fL (ref 80.0–100.0)
Monocytes Absolute: 0.7 10*3/uL (ref 0.1–1.0)
Monocytes Relative: 10 %
Neutro Abs: 5.3 10*3/uL (ref 1.7–7.7)
Neutrophils Relative %: 71 %
Platelets: 227 10*3/uL (ref 150–400)
RBC: 3.82 MIL/uL — ABNORMAL LOW (ref 3.87–5.11)
RDW: 16.3 % — ABNORMAL HIGH (ref 11.5–15.5)
WBC: 7.4 10*3/uL (ref 4.0–10.5)
nRBC: 0 % (ref 0.0–0.2)

## 2021-05-30 LAB — BASIC METABOLIC PANEL
Anion gap: 9 (ref 5–15)
BUN: 9 mg/dL (ref 8–23)
CO2: 22 mmol/L (ref 22–32)
Calcium: 9 mg/dL (ref 8.9–10.3)
Chloride: 108 mmol/L (ref 98–111)
Creatinine, Ser: 0.62 mg/dL (ref 0.44–1.00)
GFR, Estimated: >60 mL/min (ref >60)
Glucose, Bld: 106 mg/dL — ABNORMAL HIGH (ref 70–99)
Potassium: 3.6 mmol/L (ref 3.5–5.1)
Sodium: 139 mmol/L (ref 135–145)

## 2021-05-30 LAB — TROPONIN I (HIGH SENSITIVITY): Troponin I (High Sensitivity): 7 ng/L (ref <18)

## 2021-05-30 IMAGING — DX DG CHEST 1V
1 series · 1 of 1 positions shown · non-contrast
Comparison: Chest radiograph 02/06/2021.

CLINICAL DATA: Chest pain and shortness of breath.

EXAM:
CHEST  1 VIEW

[chest ap]
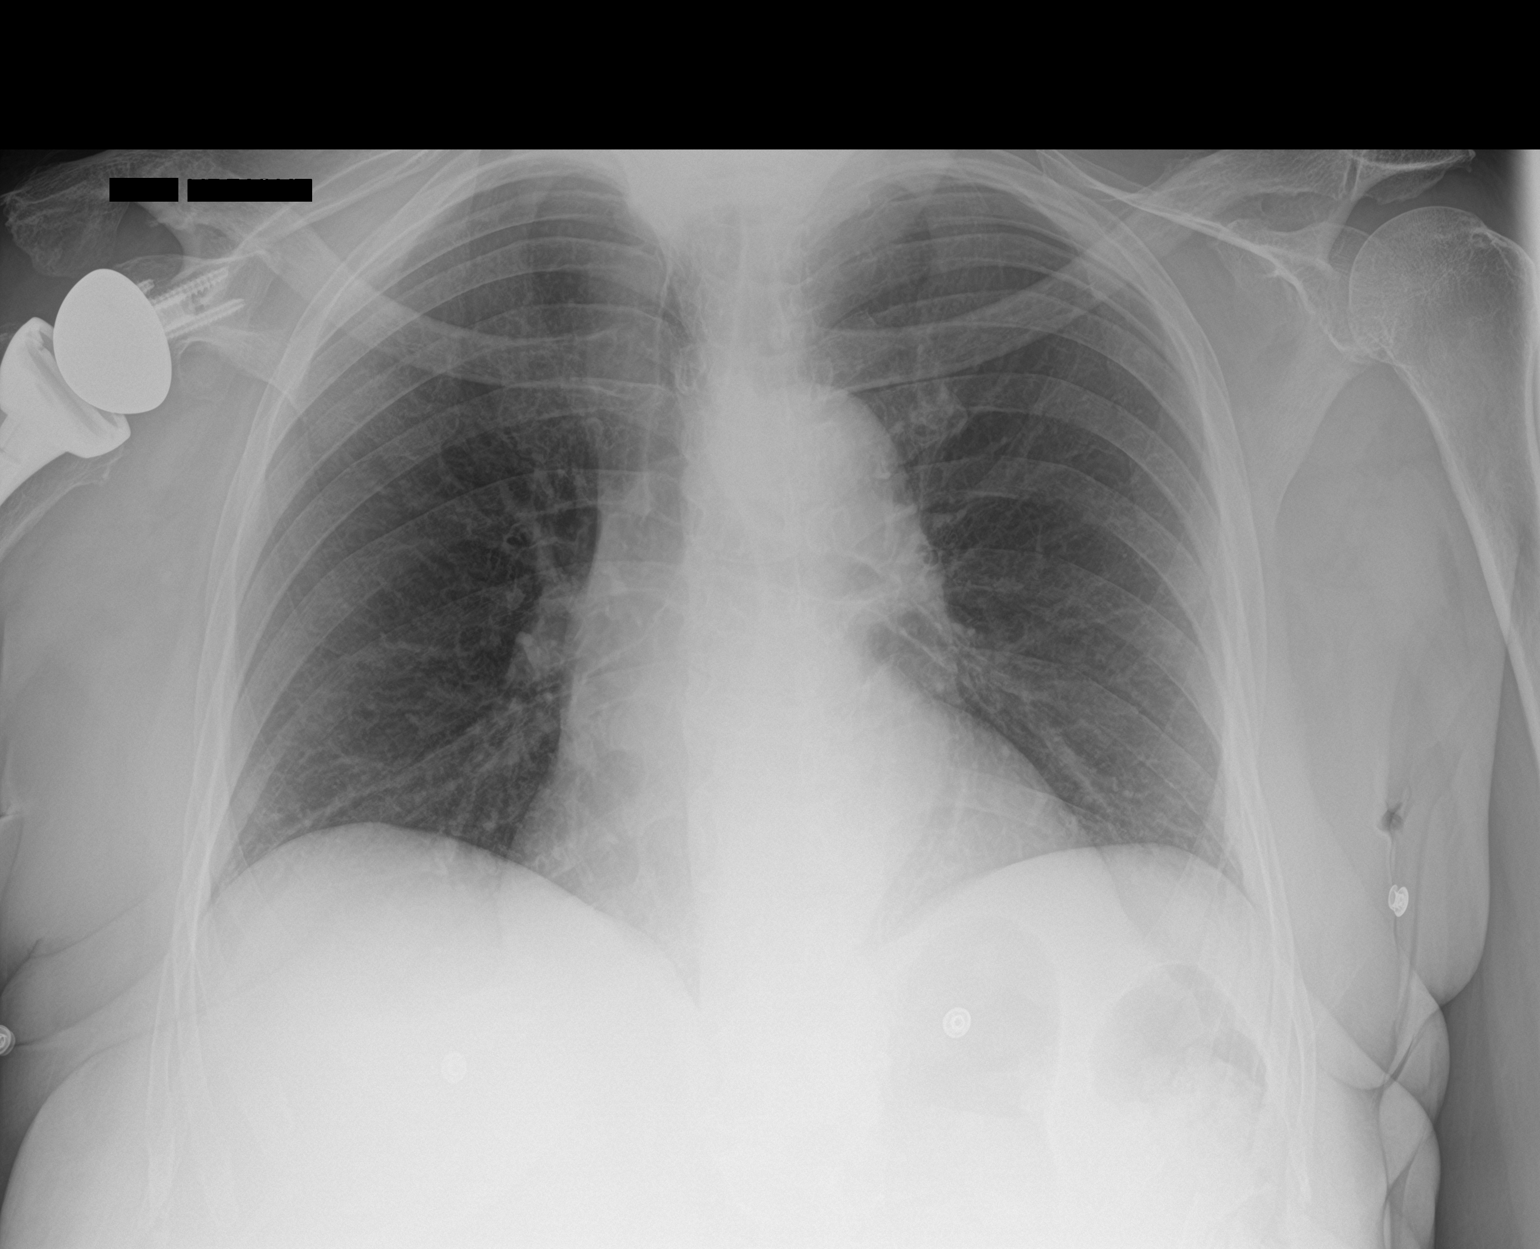

[1 of 1 positions shown; findings below may reference images not displayed]

FINDINGS: Stable cardiac and mediastinal contours. No consolidative pulmonary
opacities. No pleural effusion or pneumothorax.
IMPRESSION: No active disease.

## 2021-05-30 MED ORDER — HEPARIN SODIUM (PORCINE) 5000 UNIT/ML IJ SOLN: 4000.0000 [IU] | Freq: Once | INTRAMUSCULAR | Status: DC

## 2021-05-30 MED ORDER — HEPARIN (PORCINE) 25000 UT/250ML-% IV SOLN
1550.0000 [IU]/h | INTRAVENOUS | Status: DC
  Administered 2021-05-30 – 2021-05-31 (×2): 1500 [IU]/h via INTRAVENOUS
  Administered 2021-06-01 – 2021-06-02 (×2): 1550 [IU]/h via INTRAVENOUS
  Filled 2021-05-30 (×4): qty 250

## 2021-05-30 MED ORDER — HEPARIN (PORCINE) 25000 UT/250ML-% IV SOLN: 14.0000 [IU]/kg/h | INTRAVENOUS | Status: DC

## 2021-05-30 MED ORDER — HEPARIN BOLUS VIA INFUSION
5000.0000 [IU] | Freq: Once | INTRAVENOUS | Status: AC
  Administered 2021-05-30: 5000 [IU] via INTRAVENOUS
  Filled 2021-05-30: qty 5000

## 2021-05-30 NOTE — ED Notes (Signed)
Patient is resting comfortably. 

## 2021-05-30 NOTE — Progress Notes (Signed)
VASCULAR LAB    Left lower extremity venous duplex has been performed.  See CV proc for preliminary results.  Gave verbal report to Dr. Reino Kent, [facility], RVT 05/30/2021, 6:44 PM

## 2021-05-30 NOTE — Progress Notes (Addendum)
ANTICOAGULATION CONSULT NOTE - Initial Consult  Pharmacy Consult for Heparin Indication:  VTE Treatment  Allergies  Allergen Reactions   Cephalexin Other (See Comments)    URINARY RETENTION = "blocked my urine"   Codeine Hives and Rash   Nucynta [Tapentadol Hcl] Other (See Comments)    Altered mental status    Patient Measurements:   Heparin Dosing Weight: 85.2 kg  Vital Signs: Temp: 99.2 F (37.3 C) (07/30 1650) Temp Source: Oral (07/30 1650) BP: 158/77 (07/30 2000) Pulse Rate: 89 (07/30 2000)  Labs: Recent Labs    05/30/21 1911  HGB 10.4*  HCT 33.0*  PLT 227  CREATININE 0.62  TROPONINIHS 7    Estimated Creatinine Clearance: 73.4 mL/min (by C-G formula based on SCr of 0.62 mg/dL).   Medical History: Past Medical History:  Diagnosis Date   Abnormal nuclear stress test 10/18/2013   Arm pain 10/18/2013   Arthritis    Cataract    RIGHT EYE   Chest pain 10/18/2013   Diabetes mellitus    dx over 5 yrs ago   GERD (gastroesophageal reflux disease)    Heart murmur    "yrs ago in [place] -- been here since 1999, "   History of hiatal hernia    Hypertension    Prolonged Q-T interval on ECG 04/20/2017   Pulmonary edema 04/20/2017    Medications:  (Not in a hospital admission)  Scheduled:   heparin  5,000 Units Intravenous Once   Infusions:   heparin      Assessment: 85 yof presenting with LLE swelling occurring overnight. Recent admission on July 22 for small bowel obstruction. Patient with history of extensive DVT in LLE in April of this year. During last admit, patient was started on IV heparin underwent thrombectomy and thrombolysis and then was transitioned to apixaban. Patient reports was not able to fill apixaban from [facility] upon discharge and has not been on any anticoagulation. Pharmacy consulted for heparin infusion in setting of VTE.   CBC stable  Most recent therapeutic rate on 7/24 was 1600 units/hr but was found to be supra-therapeutic on  this rate just prior to transitioned off heparin.   Goal of Therapy:  Heparin level 0.3-0.7 units/ml Monitor platelets by anticoagulation protocol: Yes   Plan:  Give 5000 units bolus x 1 Start heparin infusion at 1500 units/hr Check anti-Xa level in 6-8 hours and daily while on heparin Continue to monitor H&H and platelets  Heloise Purpura 05/30/2021,9:33 PM

## 2021-05-30 NOTE — ED Triage Notes (Signed)
Pt reports L leg swelling since yesterday with pain.  History of surgery to L leg in April due to DVT.  Denies SOB.
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
## 2021-05-30 NOTE — ED Provider Notes (Signed)
Emergency Medicine Provider Triage Evaluation Note  Diane [name] , a 75 y.o. female  was evaluated in triage.  Pt complains of chest pain pain and left leg swelling x 1 day.  Patient states she had recent surgery for DVT a month ago.  She was taking Eliquis prior to surgery however it was stopped and she has not yet restarted it.  She noticed that her leg was swollen yesterday and had a throbbing pain.  She also states while waiting in the lobby to be triage she started develop centralized chest pain.  Pain does not radiate.  Pain is very mild, she describes as a twinge.    Review of Systems  Positive: Leg pain and swelling, chest pain Negative: Fever, chills, shortness of breath, numbness, weakness  Physical Exam  BP (!) 146/81 (BP Location: Right Arm)   Pulse 97   Temp 99.2 F (37.3 C) (Oral)   Resp 20   SpO2 100%  Gen:   Awake, no distress   Resp:  Normal effort  MSK:   Moves extremities without difficulty  Other:  Left leg is swollen compartments are hard.  Equal tactile temperature in all extremities. Found left DP pulse w/ doppler and marked on foot.  Medical Decision Making  Medically screening exam initiated at 4:53 PM.  Appropriate orders placed.  [name] was informed that the remainder of the evaluation will be completed by another provider, this initial triage assessment does not replace that evaluation, and the importance of remaining in the ED until their evaluation is complete.  Cardiac work-up and vascular ultrasound ordered for left lower extremity.    Portions of this note were generated with Lobbyist. Dictation errors may occur despite best attempts at proofreading.    Barrie Folk, PA-C 05/30/21 1710    Fredia Sorrow, MD 05/30/21 2100

## 2021-05-30 NOTE — Consult Note (Addendum)
ASSESSMENT & PLAN   LEFT LOWER EXTREMITY DVT: This patient has developed a recurrent left lower extremity DVT being off her Eliquis since mid July.  She has been started on heparin.  I have written for leg elevation.  I will discuss possible thrombolysis with Dr. Oneida Alar.  I am not sure that she would be a candidate for mechanical thrombectomy given that she has 2 large wallstents in her iliac system.  I do not see any contraindications to thrombolysis.   REASON FOR CONSULT:    Left lower extremity DVT.  The consult is requested by Dr. Tonie Griffith.  HPI:   Diane [name] is a 75 y.o. female who presents with acute onset of left lower extremity swelling this morning.  The patient underwent an IVC, common iliac, external iliac vein, femoral and popliteal vein mechanical thrombectomy by Dr. Oneida Alar on 02/06/2021.  She had placement of 2 wallstents in her iliac system on the left.  On 03/12/2021 she had a duplex scan which showed that her stent was patent.  She had been on Eliquis.  She reports that she ran out of Eliquis in mid July and for what ever reason the prescription had been denied.  She was in the process of getting is reconciled with her medical doctor but developed a small bowel obstruction was in the hospital.  She has not been on her Eliquis since mid July.  She developed the sudden onset of left lower extremity.  Swelling today and presented to the emergency department.  She underwent a duplex scan which shows an extensive left lower extremity DVT.  She has been started on heparin.  She denies any chest pain or significant shortness of breath.  She denies any injury to the left leg.  She is had no recent travel.  She has had no recent surgery.  I do not see any contraindications to thrombolysis.  She denies any history of GI bleed, intracranial aneurysm or stroke.   Past Medical History:  Diagnosis Date   Abnormal nuclear stress test 10/18/2013   Arm pain 10/18/2013   Arthritis     Cataract    RIGHT EYE   Chest pain 10/18/2013   Diabetes mellitus    dx over 5 yrs ago   GERD (gastroesophageal reflux disease)    Heart murmur    "yrs ago in [place] -- been here since 1999, "   History of hiatal hernia    Hypertension    Prolonged Q-T interval on ECG 04/20/2017   Pulmonary edema 04/20/2017    Family History  Family history unknown: Yes    SOCIAL HISTORY: Social History   Tobacco Use   Smoking status: Never   Smokeless tobacco: Never  Substance Use Topics   Alcohol use: Never    Allergies  Allergen Reactions   Cephalexin Other (See Comments)    URINARY RETENTION = "blocked my urine"   Codeine Hives and Rash   Nucynta [Tapentadol Hcl] Other (See Comments)    Altered mental status    Current Facility-Administered Medications  Medication Dose Route Frequency Provider Last Rate Last Admin   heparin ADULT infusion 100 units/mL (25000 units/259m)  1,500 Units/hr Intravenous Continuous WHeloise Purpura RPH       heparin bolus via infusion 5,000 Units  5,000 Units Intravenous [facility]      Current Outpatient Medications  Medication Sig Dispense Refill   apixaban (ELIQUIS) 5 MG TABS tablet Take 1 tablet (5 mg  total) by mouth 2 (two) times daily. 60 tablet 0   clotrimazole-betamethasone (LOTRISONE) cream Apply 1 application topically 2 (two) times daily. Apply to feet     docusate sodium (COLACE) 100 MG capsule Take 1 capsule (100 mg total) by mouth 2 (two) times daily. 60 capsule 0   DULoxetine (CYMBALTA) 20 MG capsule Take 20 mg by mouth every morning.     folic acid (FOLVITE) 1 MG tablet Take 1 tablet (1 mg total) by mouth daily. 30 tablet 0   losartan (COZAAR) 50 MG tablet Take 1 tablet (50 mg total) by mouth daily. 30 tablet 0   metFORMIN (GLUCOPHAGE-XR) 500 MG 24 hr tablet Take 1 tablet by mouth 2 (two) times daily with a meal.     methotrexate (RHEUMATREX) 2.5 MG tablet Take 8 tablets (20 mg total) by mouth once a week. 8  tablets weekly (Sunday) 32 tablet 0   polyethylene glycol (MIRALAX / GLYCOLAX) 17 g packet Take 17 g by mouth 2 (two) times daily. 14 each 0   rosuvastatin (CRESTOR) 10 MG tablet Take 1 tablet (10 mg total) by mouth daily. 30 tablet 0   simethicone (MYLICON) 80 MG chewable tablet Chew 1 tablet (80 mg total) by mouth 4 (four) times daily. 30 tablet 0   tiZANidine (ZANAFLEX) 4 MG tablet Take 4 mg by mouth every morning.      REVIEW OF SYSTEMS:  '[X]'$  denotes positive finding, '[ ]'$  denotes negative finding Cardiac  Comments:  Chest pain or chest pressure:    Shortness of breath upon exertion:    Short of breath when lying flat:    Irregular heart rhythm:        Vascular    Pain in calf, thigh, or hip brought on by ambulation:    Pain in feet at night that wakes you up from your sleep:     Blood clot in your veins:    Leg swelling:  x       Pulmonary    Oxygen at home:    Productive cough:     Wheezing:         Neurologic    Sudden weakness in arms or legs:     Sudden numbness in arms or legs:     Sudden onset of difficulty speaking or slurred speech:    Temporary loss of vision in one eye:     Problems with dizziness:         Gastrointestinal    Blood in stool:     Vomited blood:         Genitourinary    Burning when urinating:     Blood in urine:        Psychiatric    Major depression:         Hematologic    Bleeding problems:    Problems with blood clotting too easily:        Skin    Rashes or ulcers:        Constitutional    Fever or chills:    -  PHYSICAL EXAM:   Vitals:   05/30/21 1815 05/30/21 1845 05/30/21 2000 05/30/21 2137  BP: 139/77 (!) 113/52 (!) 158/77   Pulse: 91 84 89   Resp: 18 (!) 22 (!) 22   Temp:      TempSrc:      SpO2: 99% 100% 99%   Weight:    97.5 kg  Height:    '5\' 8"'$  (1.727 m)  Body mass index is 32.68 kg/m. GENERAL: The patient is a well-nourished female, in no acute distress. The vital signs are documented above. CARDIAC:  There is a regular rate and rhythm.  VASCULAR: I do not detect carotid bruits. She has a biphasic dorsalis pedis and posterior tibial signal bilaterally. She has significant left lower extremity swelling.   PULMONARY: There is good air exchange bilaterally without wheezing or rales. ABDOMEN: Soft and non-tender with normal pitched bowel sounds.  MUSCULOSKELETAL: There are no major deformities. NEUROLOGIC: No focal weakness or paresthesias are detected. SKIN: There are no ulcers or rashes noted. PSYCHIATRIC: The patient has a normal affect.  DATA:    VENOUS DUPLEX: I have independently interpreted her venous duplex scan of the left lower extremity.  This shows extensive DVT of the left lower extremity involving the common iliac vein, common femoral vein, saphenofemoral junction, femoral vein, popliteal vein, posterior tibial and peroneal veins on the left.  LABS: Her creatinine is 0.62.  GFR is greater than 60.  White blood cell count 7.4.  Hemoglobin 10.4.  Platelets 227,000.  Deitra Mayo [facility]

## 2021-05-30 NOTE — H&P (View-Only) (Signed)
ASSESSMENT & PLAN   LEFT LOWER EXTREMITY DVT: This patient has developed a recurrent left lower extremity DVT being off her Eliquis since mid July.  She has been started on heparin.  I have written for leg elevation.  I will discuss possible thrombolysis with Dr. Oneida Alar.  I am not sure that she would be a candidate for mechanical thrombectomy given that she has 2 large wallstents in her iliac system.  I do not see any contraindications to thrombolysis.   REASON FOR CONSULT:    Left lower extremity DVT.  The consult is requested by Dr. Tonie Griffith.  HPI:   Diane [name] is a 75 y.o. female who presents with acute onset of left lower extremity swelling this morning.  The patient underwent an IVC, common iliac, external iliac vein, femoral and popliteal vein mechanical thrombectomy by Dr. Oneida Alar on 02/06/2021.  She had placement of 2 wallstents in her iliac system on the left.  On 03/12/2021 she had a duplex scan which showed that her stent was patent.  She had been on Eliquis.  She reports that she ran out of Eliquis in mid July and for what ever reason the prescription had been denied.  She was in the process of getting is reconciled with her medical doctor but developed a small bowel obstruction was in the hospital.  She has not been on her Eliquis since mid July.  She developed the sudden onset of left lower extremity.  Swelling today and presented to the emergency department.  She underwent a duplex scan which shows an extensive left lower extremity DVT.  She has been started on heparin.  She denies any chest pain or significant shortness of breath.  She denies any injury to the left leg.  She is had no recent travel.  She has had no recent surgery.  I do not see any contraindications to thrombolysis.  She denies any history of GI bleed, intracranial aneurysm or stroke.   Past Medical History:  Diagnosis Date   Abnormal nuclear stress test 10/18/2013   Arm pain 10/18/2013   Arthritis     Cataract    RIGHT EYE   Chest pain 10/18/2013   Diabetes mellitus    dx over 5 yrs ago   GERD (gastroesophageal reflux disease)    Heart murmur    "yrs ago in [place] -- been here since 1999, "   History of hiatal hernia    Hypertension    Prolonged Q-T interval on ECG 04/20/2017   Pulmonary edema 04/20/2017    Family History  Family history unknown: Yes    SOCIAL HISTORY: Social History   Tobacco Use   Smoking status: Never   Smokeless tobacco: Never  Substance Use Topics   Alcohol use: Never    Allergies  Allergen Reactions   Cephalexin Other (See Comments)    URINARY RETENTION = "blocked my urine"   Codeine Hives and Rash   Nucynta [Tapentadol Hcl] Other (See Comments)    Altered mental status    Current Facility-Administered Medications  Medication Dose Route Frequency Provider Last Rate Last Admin   heparin ADULT infusion 100 units/mL (25000 units/270m)  1,500 Units/hr Intravenous Continuous WHeloise Purpura RPH       heparin bolus via infusion 5,000 Units  5,000 Units Intravenous Once [facility]      Current Outpatient Medications  Medication Sig Dispense Refill   apixaban (ELIQUIS) 5 MG TABS tablet Take 1 tablet (5 mg  total) by mouth 2 (two) times daily. 60 tablet 0   clotrimazole-betamethasone (LOTRISONE) cream Apply 1 application topically 2 (two) times daily. Apply to feet     docusate sodium (COLACE) 100 MG capsule Take 1 capsule (100 mg total) by mouth 2 (two) times daily. 60 capsule 0   DULoxetine (CYMBALTA) 20 MG capsule Take 20 mg by mouth every morning.     folic acid (FOLVITE) 1 MG tablet Take 1 tablet (1 mg total) by mouth daily. 30 tablet 0   losartan (COZAAR) 50 MG tablet Take 1 tablet (50 mg total) by mouth daily. 30 tablet 0   metFORMIN (GLUCOPHAGE-XR) 500 MG 24 hr tablet Take 1 tablet by mouth 2 (two) times daily with a meal.     methotrexate (RHEUMATREX) 2.5 MG tablet Take 8 tablets (20 mg total) by mouth once a week. 8  tablets weekly (Sunday) 32 tablet 0   polyethylene glycol (MIRALAX / GLYCOLAX) 17 g packet Take 17 g by mouth 2 (two) times daily. 14 each 0   rosuvastatin (CRESTOR) 10 MG tablet Take 1 tablet (10 mg total) by mouth daily. 30 tablet 0   simethicone (MYLICON) 80 MG chewable tablet Chew 1 tablet (80 mg total) by mouth 4 (four) times daily. 30 tablet 0   tiZANidine (ZANAFLEX) 4 MG tablet Take 4 mg by mouth every morning.      REVIEW OF SYSTEMS:  '[X]'$  denotes positive finding, '[ ]'$  denotes negative finding Cardiac  Comments:  Chest pain or chest pressure:    Shortness of breath upon exertion:    Short of breath when lying flat:    Irregular heart rhythm:        Vascular    Pain in calf, thigh, or hip brought on by ambulation:    Pain in feet at night that wakes you up from your sleep:     Blood clot in your veins:    Leg swelling:  x       Pulmonary    Oxygen at home:    Productive cough:     Wheezing:         Neurologic    Sudden weakness in arms or legs:     Sudden numbness in arms or legs:     Sudden onset of difficulty speaking or slurred speech:    Temporary loss of vision in one eye:     Problems with dizziness:         Gastrointestinal    Blood in stool:     Vomited blood:         Genitourinary    Burning when urinating:     Blood in urine:        Psychiatric    Major depression:         Hematologic    Bleeding problems:    Problems with blood clotting too easily:        Skin    Rashes or ulcers:        Constitutional    Fever or chills:    -  PHYSICAL EXAM:   Vitals:   05/30/21 1815 05/30/21 1845 05/30/21 2000 05/30/21 2137  BP: 139/77 (!) 113/52 (!) 158/77   Pulse: 91 84 89   Resp: 18 (!) 22 (!) 22   Temp:      TempSrc:      SpO2: 99% 100% 99%   Weight:    97.5 kg  Height:    '5\' 8"'$  (1.727 m)  Body mass index is 32.68 kg/m. GENERAL: The patient is a well-nourished female, in no acute distress. The vital signs are documented above. CARDIAC:  There is a regular rate and rhythm.  VASCULAR: I do not detect carotid bruits. She has a biphasic dorsalis pedis and posterior tibial signal bilaterally. She has significant left lower extremity swelling.   PULMONARY: There is good air exchange bilaterally without wheezing or rales. ABDOMEN: Soft and non-tender with normal pitched bowel sounds.  MUSCULOSKELETAL: There are no major deformities. NEUROLOGIC: No focal weakness or paresthesias are detected. SKIN: There are no ulcers or rashes noted. PSYCHIATRIC: The patient has a normal affect.  DATA:    VENOUS DUPLEX: I have independently interpreted her venous duplex scan of the left lower extremity.  This shows extensive DVT of the left lower extremity involving the common iliac vein, common femoral vein, saphenofemoral junction, femoral vein, popliteal vein, posterior tibial and peroneal veins on the left.  LABS: Her creatinine is 0.62.  GFR is greater than 60.  White blood cell count 7.4.  Hemoglobin 10.4.  Platelets 227,000.  Deitra Mayo [facility]

## 2021-05-30 NOTE — ED Notes (Signed)
RN at bedside trying to get USIV

## 2021-05-31 ENCOUNTER — Observation Stay (HOSPITAL_COMMUNITY): Payer: Medicare Other

## 2021-05-31 ENCOUNTER — Other Ambulatory Visit: Payer: Self-pay

## 2021-05-31 DIAGNOSIS — Z7984 Long term (current) use of oral hypoglycemic drugs: Secondary | ICD-10-CM | POA: Diagnosis not present

## 2021-05-31 DIAGNOSIS — E1151 Type 2 diabetes mellitus with diabetic peripheral angiopathy without gangrene: Secondary | ICD-10-CM | POA: Diagnosis present

## 2021-05-31 DIAGNOSIS — Z881 Allergy status to other antibiotic agents status: Secondary | ICD-10-CM | POA: Diagnosis not present

## 2021-05-31 DIAGNOSIS — E1165 Type 2 diabetes mellitus with hyperglycemia: Secondary | ICD-10-CM | POA: Diagnosis not present

## 2021-05-31 DIAGNOSIS — K219 Gastro-esophageal reflux disease without esophagitis: Secondary | ICD-10-CM | POA: Diagnosis present

## 2021-05-31 DIAGNOSIS — Z20822 Contact with and (suspected) exposure to covid-19: Secondary | ICD-10-CM | POA: Diagnosis present

## 2021-05-31 DIAGNOSIS — I10 Essential (primary) hypertension: Secondary | ICD-10-CM | POA: Diagnosis present

## 2021-05-31 DIAGNOSIS — I2510 Atherosclerotic heart disease of native coronary artery without angina pectoris: Secondary | ICD-10-CM | POA: Diagnosis present

## 2021-05-31 DIAGNOSIS — R9431 Abnormal electrocardiogram [ECG] [EKG]: Secondary | ICD-10-CM | POA: Diagnosis not present

## 2021-05-31 DIAGNOSIS — I517 Cardiomegaly: Secondary | ICD-10-CM | POA: Diagnosis not present

## 2021-05-31 DIAGNOSIS — I82402 Acute embolism and thrombosis of unspecified deep veins of left lower extremity: Secondary | ICD-10-CM | POA: Diagnosis present

## 2021-05-31 DIAGNOSIS — E1140 Type 2 diabetes mellitus with diabetic neuropathy, unspecified: Secondary | ICD-10-CM | POA: Diagnosis present

## 2021-05-31 DIAGNOSIS — I82412 Acute embolism and thrombosis of left femoral vein: Secondary | ICD-10-CM | POA: Diagnosis present

## 2021-05-31 DIAGNOSIS — Z7901 Long term (current) use of anticoagulants: Secondary | ICD-10-CM | POA: Diagnosis not present

## 2021-05-31 DIAGNOSIS — Z86718 Personal history of other venous thrombosis and embolism: Secondary | ICD-10-CM | POA: Diagnosis not present

## 2021-05-31 DIAGNOSIS — E782 Mixed hyperlipidemia: Secondary | ICD-10-CM | POA: Diagnosis not present

## 2021-05-31 DIAGNOSIS — Z96611 Presence of right artificial shoulder joint: Secondary | ICD-10-CM | POA: Diagnosis present

## 2021-05-31 DIAGNOSIS — I824Y2 Acute embolism and thrombosis of unspecified deep veins of left proximal lower extremity: Secondary | ICD-10-CM | POA: Diagnosis not present

## 2021-05-31 DIAGNOSIS — M1990 Unspecified osteoarthritis, unspecified site: Secondary | ICD-10-CM | POA: Diagnosis not present

## 2021-05-31 DIAGNOSIS — Z79899 Other long term (current) drug therapy: Secondary | ICD-10-CM | POA: Diagnosis not present

## 2021-05-31 DIAGNOSIS — Z885 Allergy status to narcotic agent status: Secondary | ICD-10-CM | POA: Diagnosis not present

## 2021-05-31 DIAGNOSIS — E11649 Type 2 diabetes mellitus with hypoglycemia without coma: Secondary | ICD-10-CM | POA: Diagnosis not present

## 2021-05-31 DIAGNOSIS — K449 Diaphragmatic hernia without obstruction or gangrene: Secondary | ICD-10-CM | POA: Diagnosis not present

## 2021-05-31 DIAGNOSIS — Z888 Allergy status to other drugs, medicaments and biological substances status: Secondary | ICD-10-CM | POA: Diagnosis not present

## 2021-05-31 DIAGNOSIS — J9811 Atelectasis: Secondary | ICD-10-CM | POA: Diagnosis not present

## 2021-05-31 LAB — CBC
HCT: 31.9 % — ABNORMAL LOW (ref 36.0–46.0)
Hemoglobin: 9.6 g/dL — ABNORMAL LOW (ref 12.0–15.0)
MCH: 26.1 pg (ref 26.0–34.0)
MCHC: 30.1 g/dL (ref 30.0–36.0)
MCV: 86.7 fL (ref 80.0–100.0)
Platelets: 234 10*3/uL (ref 150–400)
RBC: 3.68 MIL/uL — ABNORMAL LOW (ref 3.87–5.11)
RDW: 16.2 % — ABNORMAL HIGH (ref 11.5–15.5)
WBC: 8 10*3/uL (ref 4.0–10.5)
nRBC: 0 % (ref 0.0–0.2)

## 2021-05-31 LAB — BASIC METABOLIC PANEL
Anion gap: 9 (ref 5–15)
BUN: 7 mg/dL — ABNORMAL LOW (ref 8–23)
CO2: 24 mmol/L (ref 22–32)
Calcium: 8.6 mg/dL — ABNORMAL LOW (ref 8.9–10.3)
Chloride: 109 mmol/L (ref 98–111)
Creatinine, Ser: 0.58 mg/dL (ref 0.44–1.00)
GFR, Estimated: >60 mL/min (ref >60)
Glucose, Bld: 128 mg/dL — ABNORMAL HIGH (ref 70–99)
Potassium: 3.3 mmol/L — ABNORMAL LOW (ref 3.5–5.1)
Sodium: 142 mmol/L (ref 135–145)

## 2021-05-31 LAB — RESP PANEL BY RT-PCR (FLU A&B, COVID) ARPGX2
Influenza A by PCR: NEGATIVE
Influenza B by PCR: NEGATIVE
SARS Coronavirus 2 by RT PCR: NEGATIVE

## 2021-05-31 LAB — HEPARIN LEVEL (UNFRACTIONATED)
Heparin Unfractionated: 0.38 IU/mL (ref 0.30–0.70)
Heparin Unfractionated: 0.69 IU/mL (ref 0.30–0.70)
Heparin Unfractionated: 0.17 IU/mL — ABNORMAL LOW (ref 0.30–0.70)

## 2021-05-31 LAB — GLUCOSE, CAPILLARY
Glucose-Capillary: 103 mg/dL — ABNORMAL HIGH (ref 70–99)
Glucose-Capillary: 98 mg/dL (ref 70–99)

## 2021-05-31 LAB — PROTIME-INR
INR: 1.2 (ref 0.8–1.2)
INR: 1.2 (ref 0.8–1.2)
Prothrombin Time: 15 seconds (ref 11.4–15.2)
Prothrombin Time: 15.1 seconds (ref 11.4–15.2)

## 2021-05-31 LAB — CBG MONITORING, ED: Glucose-Capillary: 176 mg/dL — ABNORMAL HIGH (ref 70–99)

## 2021-05-31 IMAGING — CT CT ANGIO CHEST
2 of 7 series · 17 of 46 positions shown · IV contrast (APPLIED)
Comparison: Radiograph earlier today.  Chest CT 11/21/2018

CLINICAL DATA: PE suspected, high prob

Chest pain and shortness of breath.
EXAM:
CT ANGIOGRAPHY CHEST WITH CONTRAST
TECHNIQUE: Multidetector CT imaging of the chest was performed using the
standard protocol during bolus administration of intravenous
contrast. Multiplanar CT image reconstructions and MIPs were
obtained to evaluate the vascular anatomy.
CONTRAST:  80mL OMNIPAQUE IOHEXOL 350 MG/ML SOLN

[Series 7: thins · axial · 0.76mm/px · z∈[-280,-38]mm · 14 of 390 slices shown]
[im 22/390  lung]
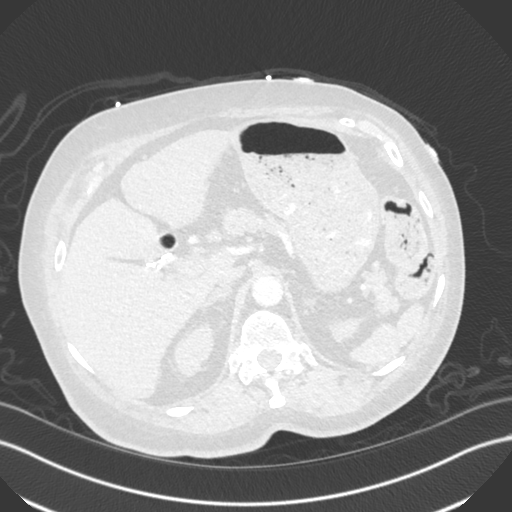
[im 44/390  soft-tissue]
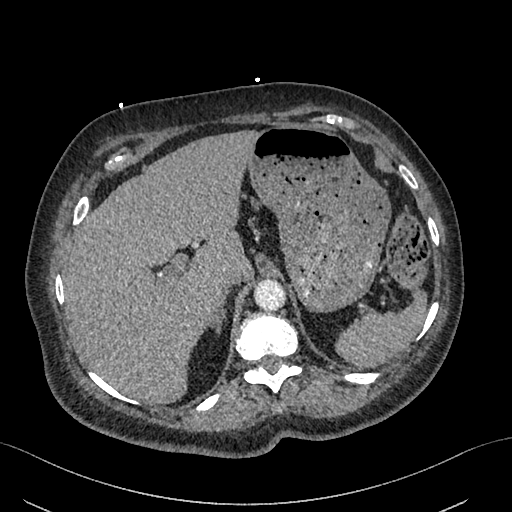
[im 87/390  lung]
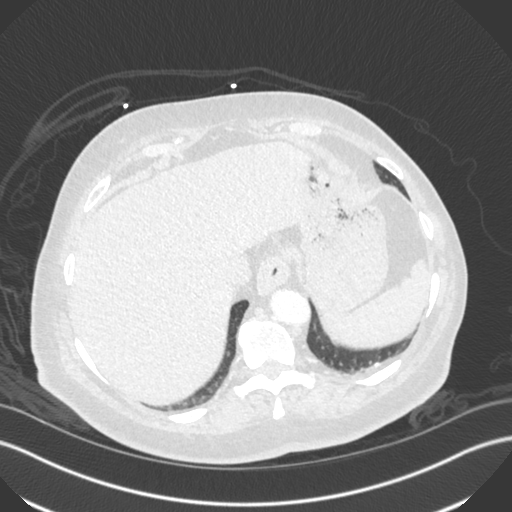
[im 109/390  soft-tissue]
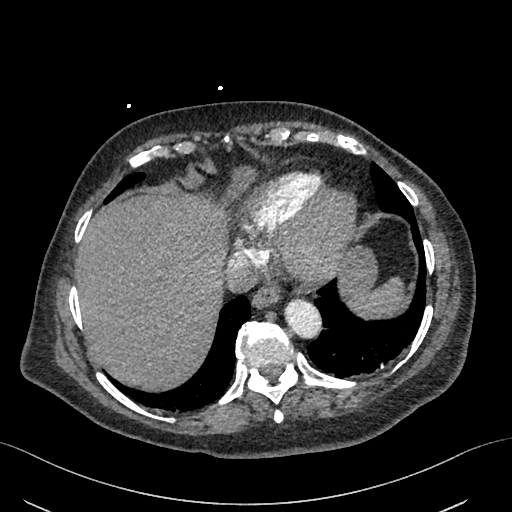
[im 130/390  lung]
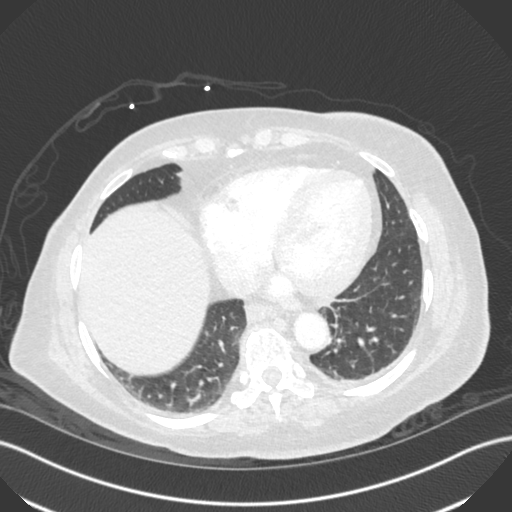
[im 152/390  soft-tissue]
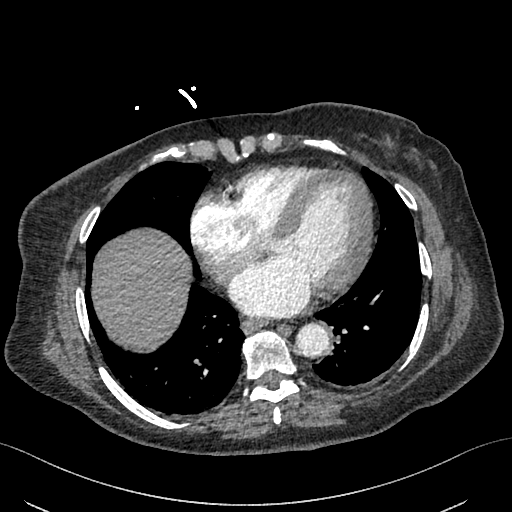
[im 173/390  lung]
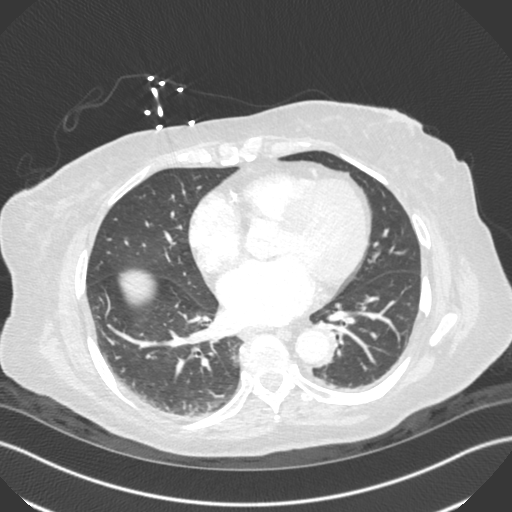
[im 217/390  soft-tissue]
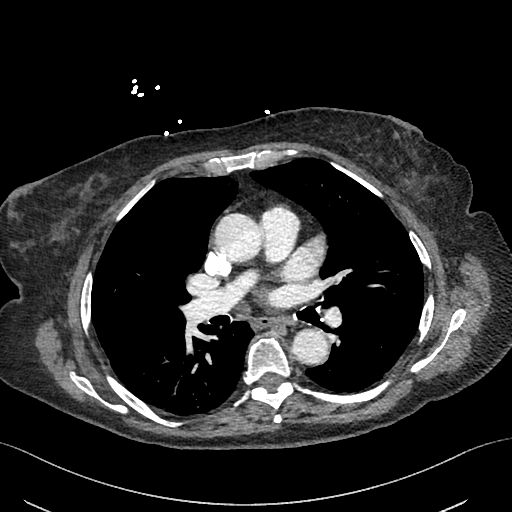
[im 238/390  lung]
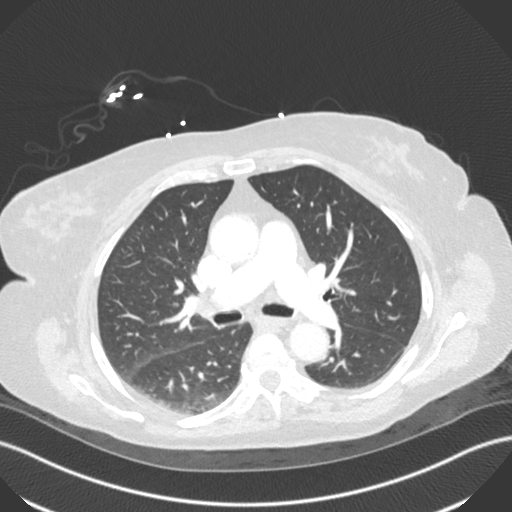
[im 260/390  soft-tissue]
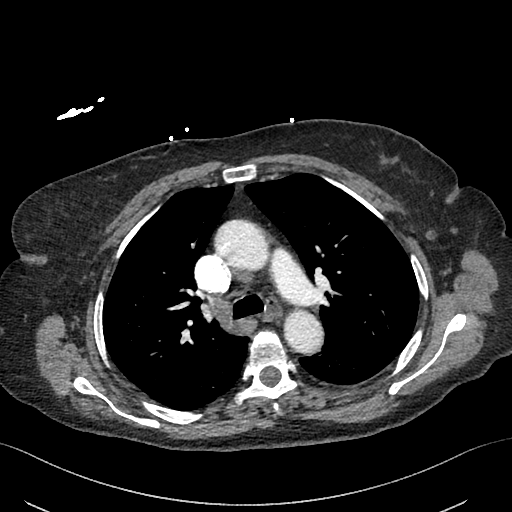
[im 281/390  lung]
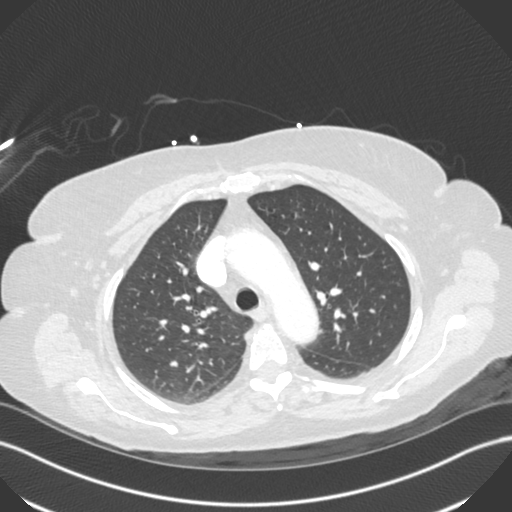
[im 303/390  soft-tissue]
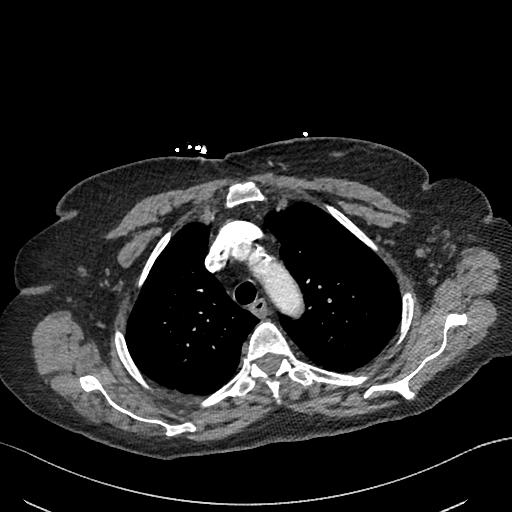
[im 346/390  lung]
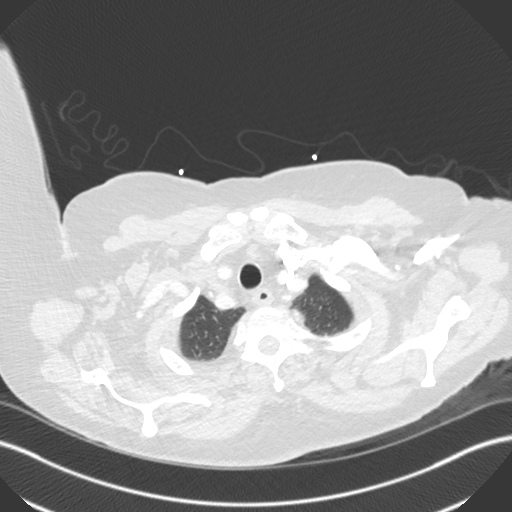
[im 368/390  soft-tissue]
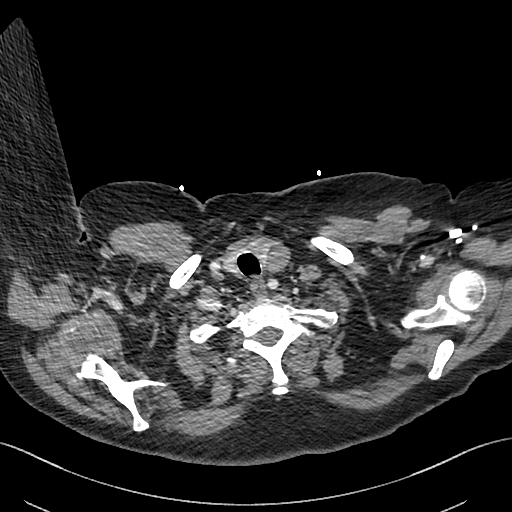

[Series 8: cor · coronal · 0.55mm/px · 3 of 155 slices shown]
[im 39/155  soft-tissue]
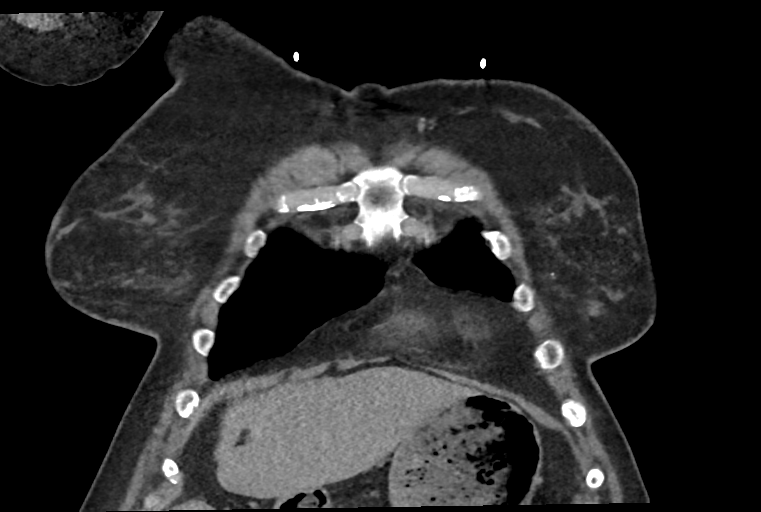
[im 78/155  soft-tissue]
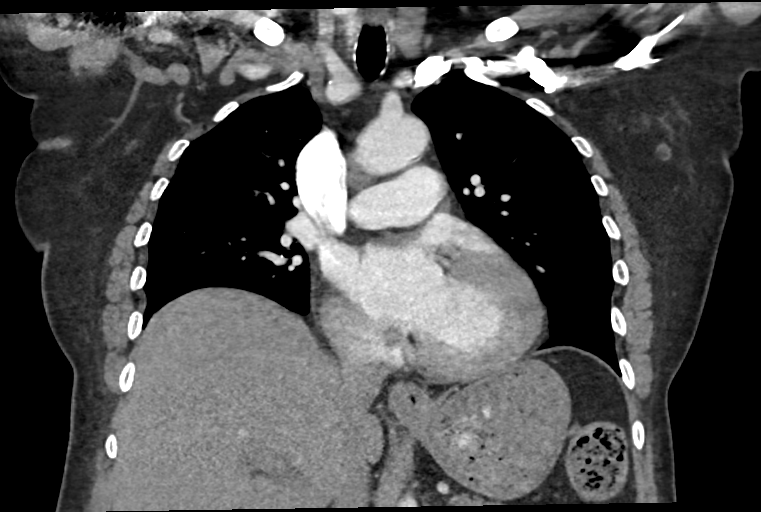
[im 116/155  soft-tissue]
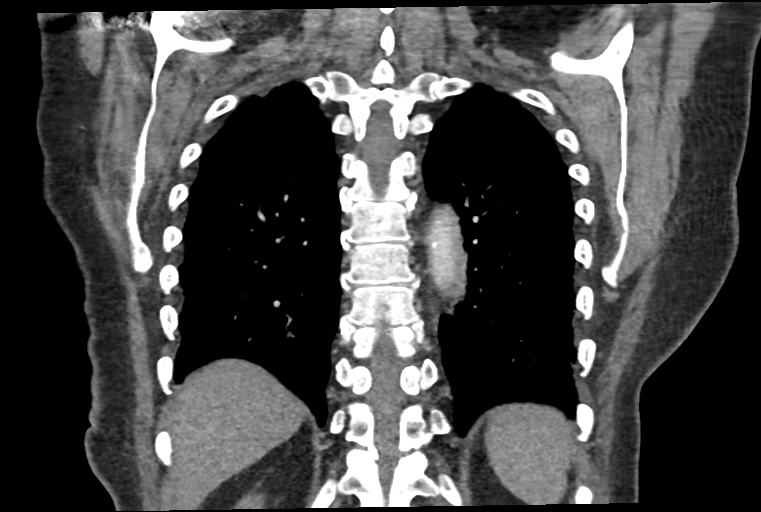

[17 of 46 positions shown; findings below may reference images not displayed]

FINDINGS: Cardiovascular: There are no filling defects within the pulmonary
arteries to suggest pulmonary embolus. Evaluation is diagnostic to
the distal segmental level. The subsegmental branches are not well
assessed due to contrast bolus timing. Borderline cardiomegaly.
There are coronary artery calcifications. Atherosclerosis of the
thoracic aorta without dissection or acute aortic findings.
Conventional branching pattern from the aortic arch. No pericardial
effusion.

Mediastinum/Nodes: No enlarged mediastinal or hilar lymph nodes.
Small hiatal hernia. No suspicious thyroid nodule. No axillary
adenopathy

Lungs/Pleura: Compressive atelectasis in the medial right lower lobe
adjacent to thoracic osteophytes. Minor bandlike scarring in the
dependent left lower lobe. No acute airspace disease. No pleural
fluid. No findings of pulmonary edema. Trachea and central bronchi
are patent. No pulmonary mass.

Upper Abdomen: Cholecystectomy. No acute upper abdominal findings.
Ingested material within the stomach.

Musculoskeletal: Reverse right shoulder arthroplasty. Thoracic
spondylosis with endplate spurring. There are no acute or suspicious
osseous abnormalities.

Review of the MIP images confirms the above findings.
IMPRESSION: 1. No pulmonary embolus or acute intrathoracic abnormality.
2. Borderline cardiomegaly.  Coronary artery calcifications.
3. Small hiatal hernia.

Aortic Atherosclerosis ([id number]).

## 2021-05-31 MED ORDER — TIZANIDINE HCL 4 MG PO TABS
4.0000 mg | ORAL_TABLET | Freq: Every morning | ORAL | Status: DC
  Administered 2021-05-31 – 2021-06-05 (×6): 4 mg via ORAL
  Filled 2021-05-31 (×6): qty 1

## 2021-05-31 MED ORDER — HYDROMORPHONE HCL 1 MG/ML IJ SOLN
1.0000 mg | INTRAMUSCULAR | Status: AC | PRN
  Administered 2021-05-31 – 2021-06-01 (×3): 1 mg via INTRAVENOUS
  Filled 2021-05-31 (×3): qty 1

## 2021-05-31 MED ORDER — LOSARTAN POTASSIUM 50 MG PO TABS
50.0000 mg | ORAL_TABLET | Freq: Every day | ORAL | Status: DC
  Administered 2021-05-31 – 2021-06-05 (×6): 50 mg via ORAL
  Filled 2021-05-31 (×6): qty 1

## 2021-05-31 MED ORDER — SODIUM CHLORIDE 0.9% FLUSH: 3.0000 mL | INTRAVENOUS | Status: DC | PRN

## 2021-05-31 MED ORDER — POLYETHYLENE GLYCOL 3350 17 G PO PACK
17.0000 g | PACK | Freq: Two times a day (BID) | ORAL | Status: DC
  Administered 2021-05-31 – 2021-06-05 (×10): 17 g via ORAL
  Filled 2021-05-31 (×10): qty 1

## 2021-05-31 MED ORDER — FOLIC ACID 1 MG PO TABS
1.0000 mg | ORAL_TABLET | Freq: Every day | ORAL | Status: DC
  Administered 2021-05-31 – 2021-06-05 (×6): 1 mg via ORAL
  Filled 2021-05-31 (×6): qty 1

## 2021-05-31 MED ORDER — KCL IN DEXTROSE-NACL 20-5-0.45 MEQ/L-%-% IV SOLN
INTRAVENOUS | Status: DC
  Filled 2021-05-31 (×5): qty 1000

## 2021-05-31 MED ORDER — ACETAMINOPHEN 325 MG PO TABS: 650.0000 mg | ORAL_TABLET | Freq: Four times a day (QID) | ORAL | Status: DC | PRN

## 2021-05-31 MED ORDER — DOCUSATE SODIUM 100 MG PO CAPS
100.0000 mg | ORAL_CAPSULE | Freq: Two times a day (BID) | ORAL | Status: DC
  Administered 2021-05-31 – 2021-06-05 (×11): 100 mg via ORAL
  Filled 2021-05-31 (×11): qty 1

## 2021-05-31 MED ORDER — ACETAMINOPHEN 650 MG RE SUPP: 650.0000 mg | Freq: Four times a day (QID) | RECTAL | Status: DC | PRN

## 2021-05-31 MED ORDER — SODIUM CHLORIDE 0.9% FLUSH
3.0000 mL | Freq: Two times a day (BID) | INTRAVENOUS | Status: DC
  Administered 2021-05-31: 3 mL via INTRAVENOUS

## 2021-05-31 MED ORDER — INSULIN ASPART 100 UNIT/ML IJ SOLN
0.0000 [IU] | Freq: Three times a day (TID) | INTRAMUSCULAR | Status: DC
  Administered 2021-05-31 – 2021-06-01 (×2): 2 [IU] via SUBCUTANEOUS
  Administered 2021-06-02: 1 [IU] via SUBCUTANEOUS
  Administered 2021-06-02 – 2021-06-03 (×2): 2 [IU] via SUBCUTANEOUS
  Administered 2021-06-04: 1 [IU] via SUBCUTANEOUS
  Administered 2021-06-05: 3 [IU] via SUBCUTANEOUS

## 2021-05-31 MED ORDER — DULOXETINE HCL 20 MG PO CPEP
20.0000 mg | ORAL_CAPSULE | Freq: Every morning | ORAL | Status: DC
  Administered 2021-05-31 – 2021-06-05 (×6): 20 mg via ORAL
  Filled 2021-05-31 (×6): qty 1

## 2021-05-31 MED ORDER — ROSUVASTATIN CALCIUM 5 MG PO TABS
10.0000 mg | ORAL_TABLET | Freq: Every day | ORAL | Status: DC
  Administered 2021-05-31 – 2021-06-05 (×6): 10 mg via ORAL
  Filled 2021-05-31 (×6): qty 2

## 2021-05-31 MED ORDER — SODIUM CHLORIDE 0.9 % IV SOLN: 250.0000 mL | INTRAVENOUS | Status: DC | PRN

## 2021-05-31 MED ORDER — IOHEXOL 350 MG/ML SOLN
80.0000 mL | Freq: Once | INTRAVENOUS | Status: AC | PRN
  Administered 2021-05-31: 80 mL via INTRAVENOUS

## 2021-05-31 MED ORDER — INSULIN ASPART 100 UNIT/ML IJ SOLN: 0.0000 [IU] | Freq: Every day | INTRAMUSCULAR | Status: DC

## 2021-05-31 NOTE — ED Notes (Signed)
PT back from CT. Covid swab sent. Lights cut off per pt request. No other complaints at this time

## 2021-05-31 NOTE — Discharge Summary (Signed)
[facility] Discharge Summary   Patient: Diane [name] [id number]  PCP: [name], MD  Date of admission: 05/22/2021   Date of discharge: 05/27/2021      Discharge Diagnoses:  Principal Problem:   SBO (small bowel obstruction) ([place]) Active Problems:   Hypertension   Type 2 diabetes mellitus with diabetic neuropathy, unspecified ([place])   Peripheral neuropathy   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[place])   Rheumatoid arthritis ([place])   Admitted From: Home Disposition:  Home   Recommendations for Outpatient Follow-up:  PCP: Follow-up with PCP in 1 week.   Follow-up Information     Care, [facility] Follow up.   Specialty: Home Health Services Why: HHPT, they will call you to schedule times for apts Contact information: [address] [place] [address] [phone]         [name], MD. Schedule an appointment as soon as possible for a visit in 1 week(s).   Specialty: Internal Medicine Contact information: [address] [place] [address] [phone]                Discharge Instructions     Diet - low sodium heart healthy   Complete by: As directed    Increase activity slowly   Complete by: As directed        Diet recommendation: Cardiac diet  Activity: The patient is advised to gradually reintroduce usual activities, as tolerated  Discharge Condition: stable  Code Status: Full code   History of present illness: As per the H and P dictated on admission, " Diane [name] is a 75 y.o. female with medical history significant of rheumatoid arthritis, previous history of small bowel obstruction, diabetes, GERD, hypertension, history of cholecystectomy, hysterectomy and D&C with hernia repair, Prolonged QTC, history of pulmonary edema and morbid obesity who has had previous history of small bowel obstruction had NG tube but then and did better with conservative measures.  Patient is now back again  with progressive abdominal pain distention.  Symptoms started suddenly last night after she ate some burgers.  With severe pain and distention.  Last bowel movement almost 2 days ago.  Patient was seen and evaluated.  Current imaging indicated partial small bowel obstruction.  Patient is therefore being admitted to the hospital for further evaluation and treatment.   ED Course: Temperature is 99.2 blood pressure 174/101, pulse 113, respirate of 24 and oxygen sats 97% on room air.  White count 10.9 the rest of the CBC within normal.  Creatinine 1.0.  COVID-19 screen is negative.  CT abdomen pelvis shows.  Similar-appearing partial small bowel obstruction with fluid distended small bowel loop extending to the ileocecal valve.  Also hepatic steatosis and small hiatal hernia."  Hospital Course:  Summary of her active problems in the hospital is as following. 1.  Partial SBO Constipation History of umbilical hernia repair Hysterectomy SP small bowel protocol.  Contrast in the colon. NG tube removed. Tolerating soft diet.  Still has some abdominal pain x-ray shows no evidence of ongoing obstruction.  Constipation noted.  Had a bowel movement.  We will continue with bowel regimen on discharge. Due to potential for constipation I will also be holding Norvasc.  Switch to losartan on discharge. Also provide Gas-X prescription.   2.  Hypokalemia Replaced.   3.  History of DVT Was on IV heparin. Switch to Lovenox. Transition to oral Eliquis. Patient is on Eliquis and aspirin together.  Patient does not have any  recent history of CVA or CAD or PVD.  Patient is not aware why she is taking aspirin.  We will hold on discharge.   4.  Rheumatoid arthritis On methotrexate. Was on hold.  Resume on discharge   5.  Type 2 diabetes mellitus, uncontrolled with hypoglycemia without long-term insulin use and complication. On metformin, was on hold. Resume on discharge. Given the history of hypertension we will  be actually initiating the patient on losartan on discharge.   6.  History of depression On Cymbalta. Was on hold.  Resume on discharge  7.  Obesity. Placing the patient at high risk for poor outcome. Body mass index is 31.93 kg/m.   Patient was seen by physical therapy, who recommended Home Health, . On the day of the discharge the patient's vitals were stable, and no other new acute medical condition were reported. The patient was felt safe to be discharge at Home with Home health.  Consultants: General surgery  Procedures: none  DISCHARGE MEDICATION: Allergies as of 05/27/2021       Reactions   Cephalexin Other (See Comments)   URINARY RETENTION = "blocked my urine"   Codeine Hives, Rash   Nucynta [tapentadol Hcl] Other (See Comments)   Altered mental status        Medication List     STOP taking these medications    amLODipine 5 MG tablet Commonly known as: NORVASC   aspirin EC 81 MG tablet       TAKE these medications    clotrimazole-betamethasone cream Commonly known as: LOTRISONE Apply 1 application topically 2 (two) times daily. Apply to feet   docusate sodium 100 MG capsule Commonly known as: COLACE Take 1 capsule (100 mg total) by mouth 2 (two) times daily. What changed: when to take this   DULoxetine 20 MG capsule Commonly known as: CYMBALTA Take 20 mg by mouth every morning.   Eliquis 5 MG Tabs tablet Generic drug: apixaban Take 1 tablet (5 mg total) by mouth 2 (two) times daily.   folic acid 1 MG tablet Commonly known as: FOLVITE Take 1 tablet (1 mg total) by mouth daily.   losartan 50 MG tablet Commonly known as: COZAAR Take 1 tablet (50 mg total) by mouth daily.   metFORMIN 500 MG 24 hr tablet Commonly known as: GLUCOPHAGE-XR Take 1 tablet by mouth 2 (two) times daily with a meal.   methotrexate 2.5 MG tablet Commonly known as: RHEUMATREX Take 8 tablets (20 mg total) by mouth once a week. 8 tablets weekly (Sunday) What  changed:  when to take this additional instructions   polyethylene glycol 17 g packet Commonly known as: MIRALAX / GLYCOLAX Take 17 g by mouth 2 (two) times daily. What changed: when to take this   rosuvastatin 10 MG tablet Commonly known as: CRESTOR Take 1 tablet (10 mg total) by mouth daily.   simethicone 80 MG chewable tablet Commonly known as: MYLICON Chew 1 tablet (80 mg total) by mouth 4 (four) times daily.   tiZANidine 4 MG tablet Commonly known as: ZANAFLEX Take 4 mg by mouth every morning.        Discharge Exam: Filed Weights   05/22/21 1049  Weight: 95.3 kg   Vitals:   05/27/21 0756 05/27/21 1151  BP: (!) 142/72 127/78  Pulse: 75 81  Resp: 16 20  Temp: 98.3 F (36.8 C) 98.1 F (36.7 C)  SpO2: 98% 100%   General: Appear in mild distress, no Rash; Oral Mucosa Clear, moist.  no Abnormal Neck Mass Or lumps, Conjunctiva normal  Cardiovascular: S1 and S2 Present, no Murmur, Respiratory: good respiratory effort, Bilateral Air entry present and CTA, no Crackles, no wheezes Abdomen: Bowel Sound present, Soft and mild right sided tenderness Extremities: no Pedal edema Neurology: alert and oriented to time, place, and person affect appropriate. no new focal deficit Gait not checked due to patient safety concerns  The results of significant diagnostics from this hospitalization (including imaging, microbiology, ancillary and laboratory) are listed below for reference.    Significant Diagnostic Studies: DG Chest 1 View  CT Abdomen Pelvis W Contrast  Result Date: 05/22/2021 CLINICAL DATA:  Abdominal pain, acute, nonlocalized EXAM: CT ABDOMEN AND PELVIS WITH CONTRAST TECHNIQUE: Multidetector CT imaging of the abdomen and pelvis was performed using the standard protocol following bolus administration of intravenous contrast. CONTRAST:  11m OMNIPAQUE IOHEXOL 300 MG/ML  SOLN COMPARISON:  CT abdomen pelvis 01/22/2021 FINDINGS: Lower chest: Linear atelectasis versus  scarring within the right lower lobe. Similar finding within the left lower lobe. Also right middle lobe. Coronary artery calcifications. Possible tiny hiatal hernia. Hepatobiliary: The hepatic parenchyma is diffusely hypodense compared to the splenic parenchyma consistent with fatty infiltration. No focal liver abnormality. Status post cholecystectomy. No biliary dilatation. Pancreas: No focal lesion. Normal pancreatic contour. No surrounding inflammatory changes. No main pancreatic ductal dilatation. Spleen: Normal in size without focal abnormality. Adrenals/Urinary Tract: No adrenal nodule bilaterally. No nephrolithiasis, no hydronephrosis, and no contour-deforming renal mass. No ureterolithiasis or hydroureter. The urinary bladder is unremarkable. On delayed imaging, there is no urothelial wall thickening and there are no filling defects in the opacified portions of the bilateral collecting systems or ureters. Stomach/Bowel: Stomach is within normal limits. Small bowel distended with fluid measuring up to 3.4 cm. Fluid distended small bowel extends to the terminal ileum. Hazy contour of the terminal ileum/ileocecal valve region with narrowing of the small bowel. Fluid density within the lumen of the cecum. No definite pneumatosis. Similar-appearing thickened base of the appendix with distal appendix appear normal (6:41). Vascular/Lymphatic: Limited evaluation of the left common and external iliac venous stent. No abdominal aorta or iliac aneurysm. Mild atherosclerotic plaque of the aorta and its branches. No abdominal, pelvic, or inguinal lymphadenopathy. Reproductive: Status post hysterectomy. No adnexal masses. Other: Interval increase of trace volume ascites. No intraperitoneal free gas. No organized fluid collection. Musculoskeletal: No abdominal wall hernia or abnormality. No suspicious lytic or blastic osseous lesions. No acute displaced fracture. IMPRESSION: 1. Similar-appearing partial small bowel  obstruction with fluid distended small bowel loop extending to the ileocecal valve. Question ileocecal/base of the appendix mass. Recommend colonoscopy for further evaluation. 2. Interval increase in trace volumes ascites. 3. Hepatic steatosis. 4. Possible small hiatal hernia. Electronically Signed   By: MIven FinnM.D.   On: 05/22/2021 17:34   DG Abd Portable 1V  Result Date: 05/27/2021 CLINICAL DATA:  Ileus. EXAM: PORTABLE ABDOMEN - 1 VIEW COMPARISON:  May 24, 2021. FINDINGS: Post cholecystectomy. Stenting overlying the LEFT pelvis as before. Gas in the colon to the level of the rectum. Small bowel gas in the RIGHT hemiabdomen likely distal ileum. Question of fold thickening of the ileum. On limited assessment no acute regional skeletal process. Calcifications over LEFT psoas compatible with phleboliths in the ovarian vein. IMPRESSION: Signs of mild ileus. Question enteritis. Perhaps slightly less distension of proximal small bowel loops since the prior study. Electronically Signed   By: GZetta BillsM.D.   On: 05/27/2021 09:01   DG Abd  Portable 1V-Small Bowel Obstruction Protocol-initial, 8 hr delay  Result Date: 05/24/2021 CLINICAL DATA:  Small bowel obstruction 8 hour delayed film. EXAM: PORTABLE ABDOMEN - 1 VIEW COMPARISON:  05/24/2021 FINDINGS: Supine views of the abdomen were obtained 8 hours after enteric administration of contrast material. Contrast material is demonstrated throughout the colon suggesting no evidence of high-grade small bowel obstruction. A few mildly dilated gas-filled small bowel loops are present suggesting partial obstruction or ileus. Vascular stent in the pelvis. Degenerative changes in the spine and hips. Surgical clips in the right upper quadrant. IMPRESSION: Contrast material is demonstrated throughout the colon suggesting no evidence of high-grade small bowel obstruction. Changes may represent ileus or partial obstruction. Electronically Signed   By: Lucienne Capers M.D.   On: 05/24/2021 18:44   DG Abd Portable 1V-Small Bowel Protocol-Position Verification  Result Date: 05/24/2021 CLINICAL DATA:  Nasogastric tube placement. EXAM: PORTABLE ABDOMEN - 1 VIEW COMPARISON:  Abdominal radiograph dated 05/23/2021. FINDINGS: An enteric tube tip overlies the stomach, similar in position to prior exam. The gastroesophageal junction and lung bases are not imaged on this exam. IMPRESSION: Enteric tube tip overlying the stomach. Electronically Signed   By: Zerita Boers M.D.   On: 05/24/2021 10:49   DG Abd Portable 1V  Result Date: 05/23/2021 CLINICAL DATA:  NG tube placement EXAM: PORTABLE ABDOMEN - 1 VIEW COMPARISON:  CT 05/22/2021 FINDINGS: Transesophageal tube tip and side port terminate in the left upper quadrant the region of the gastric body, distal to the GE junction. Surgical clips again seen in the right upper quadrant. Vascular stenting of the left iliac is again seen. External support devices overlie the chest. Lung bases are clear aside from some minimal atelectasis. Distended upper abdominal bowel is less well visualized on this radiograph. Please note this is neither a complete view of the chest or abdomen. IMPRESSION: Transesophageal tube tip and side port terminate in the region of the gastric body, distal to the GE junction. Electronically Signed   By: Lovena Le M.D.   On: 05/23/2021 00:49   Microbiology: Recent Results (from the past 240 hour(s))  Resp Panel by RT-PCR (Flu A&B, Covid) Nasopharyngeal Swab     Status: None   Collection Time: 05/22/21  6:48 PM   Specimen: Nasopharyngeal Swab; Nasopharyngeal(NP) swabs in vial transport medium  Result Value Ref Range Status   SARS Coronavirus 2 by RT PCR NEGATIVE NEGATIVE Final    Comment: (NOTE) SARS-CoV-2 target nucleic acids are NOT DETECTED.  The SARS-CoV-2 RNA is generally detectable in upper respiratory specimens during the acute phase of infection. The lowest concentration of SARS-CoV-2  viral copies this assay can detect is 138 copies/mL. A negative result does not preclude SARS-Cov-2 infection and should not be used as the sole basis for treatment or other patient management decisions. A negative result may occur with  improper specimen collection/handling, submission of specimen other than nasopharyngeal swab, presence of viral mutation(s) within the areas targeted by this assay, and inadequate number of viral copies(<138 copies/mL). A negative result must be combined with clinical observations, patient history, and epidemiological information. The expected result is Negative.  Fact Sheet for Patients:  EntrepreneurPulse.com.au  Fact Sheet for Healthcare Providers:  IncredibleEmployment.be  This test is no t yet approved or cleared by the [place] FDA and  has been authorized for detection and/or diagnosis of SARS-CoV-2 by FDA under an Emergency Use Authorization (EUA). This EUA will remain  in effect (meaning this test can be used) for  the duration of the COVID-19 declaration under Section 564(b)(1) of the Act, 21 U.S.C.section 360bbb-3(b)(1), unless the authorization is terminated  or revoked sooner.       Influenza A by PCR NEGATIVE NEGATIVE Final   Influenza B by PCR NEGATIVE NEGATIVE Final    Comment: (NOTE) The Xpert Xpress SARS-CoV-2/FLU/RSV plus assay is intended as an aid in the diagnosis of influenza from Nasopharyngeal swab specimens and should not be used as a sole basis for treatment. Nasal washings and aspirates are unacceptable for Xpert Xpress SARS-CoV-2/FLU/RSV testing.  Fact Sheet for Patients: EntrepreneurPulse.com.au  Fact Sheet for Healthcare Providers: IncredibleEmployment.be  This test is not yet approved or cleared by the [place] FDA and has been authorized for detection and/or diagnosis of SARS-CoV-2 by FDA under an Emergency Use Authorization  (EUA). This EUA will remain in effect (meaning this test can be used) for the duration of the COVID-19 declaration under Section 564(b)(1) of the Act, 21 U.S.C. section 360bbb-3(b)(1), unless the authorization is terminated or revoked.  Performed at [facility], [place] [address] [place], [place] [address]   Resp Panel by RT-PCR (Flu A&B, Covid) Nasopharyngeal Swab     Status: None     Labs: CBC: Recent Labs  Lab 05/27/21 1100  WBC 5.1  NEUTROABS  --   HGB 10.9*  HCT 35.5*  MCV 84.9  PLT [id number]   Basic Metabolic Panel: Recent Labs  Lab 05/27/21 1100  NA 138  K 3.3*  CL 106  CO2 23  GLUCOSE 168*  BUN <5*  CREATININE 0.69  CALCIUM 8.9   Liver Function Tests: No results for input(s): AST, ALT, ALKPHOS, BILITOT, PROT, ALBUMIN in the last 168 hours. CBG: Recent Labs  Lab 05/26/21 2037 05/27/21 0754 05/27/21 1148 05/27/21 1400 05/27/21 1650  GLUCAP 127* 103* 154* 177* 119*    Time spent: 35 minutes  Signed:  Berle Mull  [facility] 05/27/2021

## 2021-05-31 NOTE — Progress Notes (Signed)
[place] for Heparin Indication:  VTE Treatment  Allergies  Allergen Reactions   Cephalexin Other (See Comments)    URINARY RETENTION = "blocked my urine"   Codeine Hives and Rash   Nucynta [Tapentadol Hcl] Other (See Comments)    Altered mental status    Patient Measurements: Height: '5\' 8"'$  (172.7 cm) Weight: 97.5 kg (214 lb 15.2 oz) IBW/kg (Calculated) : 63.9 Heparin Dosing Weight: 85.2 kg  Vital Signs: Temp: 98.9 F (37.2 C) (07/31 0228) Temp Source: Oral (07/31 0228) BP: 137/67 (07/31 0600) Pulse Rate: 85 (07/31 0600)  Labs: Recent Labs    05/30/21 1911 05/30/21 2347 05/31/21 0600  HGB 10.4*  --   --   HCT 33.0*  --   --   PLT 227  --   --   LABPROT  --  15.1  --   INR  --  1.2  --   HEPARINUNFRC  --   --  0.38  CREATININE 0.62  --   --   TROPONINIHS 7  --   --      Estimated Creatinine Clearance: 74.1 mL/min (by C-G formula based on SCr of 0.62 mg/dL).   Assessment: 75 y.o. female with new LLE DVT for heparin  Goal of Therapy:  Heparin level 0.3-0.7 units/ml Monitor platelets by anticoagulation protocol: Yes   Plan:  Continue Heparin at current rate   Caryl Pina 05/31/2021,6:43 AM

## 2021-05-31 NOTE — Progress Notes (Signed)
ANTICOAGULATION CONSULT NOTE - Follow-Up Consult  Pharmacy Consult for Heparin Indication:  VTE Treatment  Allergies  Allergen Reactions   Cephalexin Other (See Comments)    URINARY RETENTION = "blocked my urine"   Codeine Hives and Rash   Nucynta [Tapentadol Hcl] Other (See Comments)    Altered mental status    Patient Measurements: Height: '5\' 8"'$  (172.7 cm) Weight: 97.5 kg (214 lb 15.2 oz) IBW/kg (Calculated) : 63.9 Heparin Dosing Weight: 85.2 kg  Vital Signs: Temp: 97.9 F (36.6 C) (07/31 2043) Temp Source: Oral (07/31 2043) BP: 141/69 (07/31 2043) Pulse Rate: 88 (07/31 2043)  Labs: Recent Labs    05/30/21 1911 05/30/21 2347 05/31/21 0600 05/31/21 0802 05/31/21 1327 05/31/21 2112  HGB 10.4*  --   --  9.6*  --   --   HCT 33.0*  --   --  31.9*  --   --   PLT 227  --   --  234  --   --   LABPROT  --  15.1  --  15.0  --   --   INR  --  1.2  --  1.2  --   --   HEPARINUNFRC  --   --  0.38  --  0.69 0.17*  CREATININE 0.62  --   --  0.58  --   --   TROPONINIHS 7  --   --   --   --   --      Estimated Creatinine Clearance: 74.1 mL/min (by C-G formula based on SCr of 0.58 mg/dL).   Medical History: Past Medical History:  Diagnosis Date   Abnormal nuclear stress test 10/18/2013   Arm pain 10/18/2013   Arthritis    Cataract    RIGHT EYE   Chest pain 10/18/2013   Diabetes mellitus    dx over 5 yrs ago   GERD (gastroesophageal reflux disease)    Heart murmur    "yrs ago in [place] -- been here since 1999, "   History of hiatal hernia    Hypertension    Prolonged Q-T interval on ECG 04/20/2017   Pulmonary edema 04/20/2017    Medications:   Scheduled:   Infusions:   sodium chloride     dextrose 5 % and 0.45 % NaCl with KCl 20 mEq/L 50 mL/hr at 05/31/21 2115   heparin 1,450 Units/hr (05/31/21 1519)    Assessment: 4 yof presenting with LLE swelling occurring overnight. Recent admission on July 22 for small bowel obstruction. Patient with history of  extensive DVT in LLE in April of this year. During last admit, patient was started on IV heparin underwent thrombectomy and thrombolysis and then was transitioned to apixaban. Patient reports was not able to fill apixaban from [facility] upon discharge and has not been on any anticoagulation.  Pharmacy consulted for heparin infusion in setting of VTE. Heparin started at 1500 units/hr with bolus of 5000 units. Overnight heparin level at 0.38 no change at that time.   Repeat heparin level this afternoon resulting at 0.69 which is borderline supra-therapeutic, and rate was dropped just a little to 1450 units/hr.  Heparin level this evening down to 0.17.  Per discussion with RN, no known issues with IV infusion.  Goal of Therapy:  Heparin level 0.3-0.7 units/ml Monitor platelets by anticoagulation protocol: Yes   Plan:  Will slightly rincrease heparin infusion to 1550 units/hr Check anti-Xa level in 8 hours and daily while on heparin Continue to monitor H&H and  platelets  [place] Crane, Diane [name], BCCP Clinical Pharmacist  05/31/2021 9:46 PM   [facility] pharmacy phone numbers are listed on [email or web address]

## 2021-05-31 NOTE — Progress Notes (Addendum)
   VASCULAR SURGERY ASSESSMENT & PLAN:   LEFT LOWER EXTREMITY DVT: This patient underwent mechanical thrombectomy of an extensive left lower extremity DVT in April of this year.  She was on Eliquis.  She had been off her Eliquis for several days and presented yesterday morning with acute onset of left leg swelling.  Given that she has a wallstents in her common and external iliac vein she may not be a candidate for mechanical thrombectomy. If not, she would be a candidate for thrombolysis.  I discussed the case with Dr. Donzetta Matters.  We will proceed with possible mechanical thrombectomy versus thrombolysis tomorrow.  I will gently hydrate her tonight.  She continues her heparin.  I have discussed the indications for the procedure with the patient and the potential complications including the 1% risk of major bleeding.   SUBJECTIVE:   She continues to complain of aching pain in the left leg.  PHYSICAL EXAM:   Vitals:   05/31/21 1300 05/31/21 1400 05/31/21 1500 05/31/21 1606  BP: (!) 100/56 108/63 (!) 113/59 (!) 111/59  Pulse: 77 80 77 77  Resp: '11 18 12 17  '$ Temp:    98.7 F (37.1 C)  TempSrc:    Oral  SpO2: 99% 100% 98% 100%  Weight:      Height:       She has persistent left lower extremity swelling with no significant improvement.  LABS:   Lab Results  Component Value Date   WBC 8.0 05/31/2021   HGB 9.6 (L) 05/31/2021   HCT 31.9 (L) 05/31/2021   MCV 86.7 05/31/2021   PLT 234 05/31/2021   Lab Results  Component Value Date   CREATININE 0.58 05/31/2021   Lab Results  Component Value Date   INR 1.2 05/31/2021   CBG (last 3)  Recent Labs    05/31/21 1224 05/31/21 1703  GLUCAP 176* 103*    PROBLEM LIST:    Principal Problem:   Left leg DVT ([facility]) Active Problems:   Hypertension   Prolonged Q-T interval on ECG   Type 2 diabetes mellitus with diabetic neuropathy, unspecified ([facility])   Acute DVT (deep venous thrombosis) ([facility])   CURRENT MEDS:    docusate sodium  100 mg  Oral BID   DULoxetine  20 mg Oral q morning   folic acid  1 mg Oral Daily   insulin aspart  0-5 Units Subcutaneous QHS   insulin aspart  0-9 Units Subcutaneous TID WC   losartan  50 mg Oral Daily   polyethylene glycol  17 g Oral BID   rosuvastatin  10 mg Oral Daily   sodium chloride flush  3 mL Intravenous Q12H   tiZANidine  4 mg Oral q morning    Deitra Mayo Office: [phone] 05/31/2021

## 2021-05-31 NOTE — Progress Notes (Signed)
[facility] PROGRESS NOTE    Progress Note  Diane [name]  [id number] DOB: [birth year] DOA: 05/30/2021 PCP: [name], MD     Brief Narrative:   Diane [name] is an 75 y.o. female past medical history significant for essential hypertension diabetes mellitus type 2 history of DVT recent small bowel obstruction comes in for evaluation of left lower extremity pain and swelling Doppler showed acute DVT and EMG of the chest showed no PE borderline cardiomegaly coronary calcification.   Assessment/Plan:   Left leg DVT [facility]): She underwent thrombectomy in April of this year she has been off Eliquis after recent admission for small bowel obstruction.  Going through the chart and speaking with the patient she was only off Eliquis for like 2 to 3 days. She was placed on heparin infusion vascular surgery consulted they recommended no indication for intervention. CT angio of the chest was negative. Keep her on IV heparin for 48 to 72 hours then can switch her back to Eliquis. She will need to follow-up with hematology as an outpatient.  Essential hypertension: Continue losartan, blood pressure is elevated likely due to pain. Continue to monitor closely.  Prolonged Q-T interval on ECG Diane [name] potassium greater than 4 magnesium greater than 2.  Type 2 diabetes mellitus with diabetic neuropathy, unspecified ([place]) Hold metformin in case contrast is needed, started on sliding scale insulin.   DVT prophylaxis: heparin Family Communication:none Status is: Observation  The patient will require care spanning > 2 midnights and should be moved to inpatient because: Hemodynamically unstable  Dispo: The patient is from: Home              Anticipated d/c is to: Home              Patient currently is not medically stable to d/c.   Difficult to place patient No        Code Status:  Code Status History     Date Active Date Inactive Code Status Order ID Comments User  Context   05/22/2021 2119 05/27/2021 2307 Full Code [id number]  Elwyn Reach, MD Inpatient   02/04/2021 2235 02/10/2021 2154 Full Code [id number]  Shela Leff, MD ED   01/22/2021 1748 01/30/2021 2251 Full Code [id number]  Lequita Halt, MD Inpatient   06/11/2018 2324 06/15/2018 0119 Full Code [id number]  Vilma Prader, MD ED   06/02/2018 1246 06/04/2018 1349 Full Code [id number]  Netta Cedars, MD Inpatient   04/19/2017 2020 04/21/2017 2016 Full Code [id number]  Norval Morton, MD ED   06/15/2014 1026 06/16/2014 2125 Full Code [id number]  Jonetta Osgood, MD Inpatient   10/18/2013 2349 10/19/2013 1906 Full Code [id number]  Phillips Grout, MD Inpatient      Questions for Most Recent Historical Code Status (Order [id number])          IV Access:   Peripheral IV   Procedures and diagnostic studies:   DG Chest 1 View  Result Date: 05/30/2021 CLINICAL DATA:  Chest pain and shortness of breath. EXAM: CHEST  1 VIEW COMPARISON:  Chest radiograph 02/06/2021. FINDINGS: Stable cardiac and mediastinal contours. No consolidative pulmonary opacities. No pleural effusion or pneumothorax. IMPRESSION: No active disease. Electronically Signed   By: Diane [name] DianeD.   On: 05/30/2021 18:01   CT Angio Chest PE W/Cm &/Or Wo Cm  Result Date: 05/31/2021 CLINICAL DATA:  PE suspected, high prob Chest pain and shortness of breath. EXAM: CT ANGIOGRAPHY CHEST WITH CONTRAST TECHNIQUE: Multidetector CT imaging  of the chest was performed using the standard protocol during bolus administration of intravenous contrast. Multiplanar CT image reconstructions and MIPs were obtained to evaluate the vascular anatomy. CONTRAST:  25m OMNIPAQUE IOHEXOL 350 MG/ML SOLN COMPARISON:  Radiograph earlier today.  Chest CT 11/21/2018 FINDINGS: Cardiovascular: There are no filling defects within the pulmonary arteries to suggest pulmonary embolus. Evaluation is diagnostic to the distal segmental level. The subsegmental branches are not  well assessed due to contrast bolus timing. Borderline cardiomegaly. There are coronary artery calcifications. Atherosclerosis of the thoracic aorta without dissection or acute aortic findings. Conventional branching pattern from the aortic arch. No pericardial effusion. Mediastinum/Nodes: No enlarged mediastinal or hilar lymph nodes. Small hiatal hernia. No suspicious thyroid nodule. No axillary adenopathy Lungs/Pleura: Compressive atelectasis in the medial right lower lobe adjacent to thoracic osteophytes. Minor bandlike scarring in the dependent left lower lobe. No acute airspace disease. No pleural fluid. No findings of pulmonary edema. Trachea and central bronchi are patent. No pulmonary mass. Upper Abdomen: Cholecystectomy. No acute upper abdominal findings. Ingested material within the stomach. Musculoskeletal: Reverse right shoulder arthroplasty. Thoracic spondylosis with endplate spurring. There are no acute or suspicious osseous abnormalities. Review of the MIP images confirms the above findings. IMPRESSION: 1. No pulmonary embolus or acute intrathoracic abnormality. 2. Borderline cardiomegaly.  Coronary artery calcifications. 3. Small hiatal hernia. Aortic Atherosclerosis (ICD10-I70.0). Electronically Signed   By: MKeith [name].D.   On: 05/31/2021 00:17   VAS [place]OWER EXTREMITY VENOUS (DVT) (ONLY [facility] & [facility])  Result Date: 05/30/2021  Lower Venous DVT Study Patient Name:  MBLODWEN [name] Date of Exam:   05/30/2021 Medical Rec #: [id number]      Accession #:    [id number] of Birth: [birth year]      Patient Gender: F Patient Age:   11YExam [facility]      VAS [place]OWER EXTREMITY VENOUS (DVT) Referring Phys: [id number] Folk--------------------------------------------------------------------------------  Indications: Edema. Other Indications: Recent admission for small bowel obstruction 05/22/21. Risk Factors: Extensive left lower extremity DVT found  02/04/21. Left lower extremity thrombectomy of the IVC, CIV, CFV, and Popliteal veins done 02/06/21. Left common external iliac artery venous stent Limitations: Edema, bowel gas and body habitus. Comparison Study: Prior study done 02/04/21 Performing Technologist: CSharion DoveRVS  Examination Guidelines: A complete evaluation includes B-mode imaging, spectral Doppler, color Doppler, and power Doppler as needed of all accessible portions of each vessel. Bilateral testing is considered an integral part of a complete examination. Limited examinations for reoccurring indications may be performed as noted. The reflux portion of the exam is performed with the patient in reverse Trendelenburg.  +-----+---------------+---------+-----------+----------+--------------+ RIGHTCompressibilityPhasicitySpontaneityPropertiesThrombus Aging +-----+---------------+---------+-----------+----------+--------------+ CFV  Full           Yes      Yes                                 +-----+---------------+---------+-----------+----------+--------------+   +---------+---------------+---------+-----------+----------+-------------------+ LEFT     CompressibilityPhasicitySpontaneityPropertiesThrombus Aging      +---------+---------------+---------+-----------+----------+-------------------+ CFV      None           No       No                   Acute               +---------+---------------+---------+-----------+----------+-------------------+ SFJ  None                                         Acute               +---------+---------------+---------+-----------+----------+-------------------+ FV Prox  None                                         Acute               +---------+---------------+---------+-----------+----------+-------------------+ FV Mid   None                                         Acute               +---------+---------------+---------+-----------+----------+-------------------+  FV DistalNone                                         Acute               +---------+---------------+---------+-----------+----------+-------------------+ PFV      None                                         Acute               +---------+---------------+---------+-----------+----------+-------------------+ POP      None           No       No                   Acute               +---------+---------------+---------+-----------+----------+-------------------+ PTV      None                                         Acute               +---------+---------------+---------+-----------+----------+-------------------+ PERO     None                                         Acute               +---------+---------------+---------+-----------+----------+-------------------+ CIV                                                   No color flow noted +---------+---------------+---------+-----------+----------+-------------------+ EIV                                                   Not visualized      +---------+---------------+---------+-----------+----------+-------------------+  Summary: RIGHT: - No evidence of common femoral vein obstruction.  LEFT: - Findings consistent with acute deep vein thrombosis involving the left common femoral vein, SF junction, left femoral vein, left proximal profunda vein, left popliteal vein, left posterior tibial veins, left peroneal veins, and Common iliac.  *See table(s) above for measurements and observations.    Preliminary      Medical Consultants:   None.   Subjective:    Diane [name] she relates she still has left lower extremity pain.  Objective:    Vitals:   05/31/21 0228 05/31/21 0515 05/31/21 0600 05/31/21 0747  BP: (!) 158/74 139/73 137/67 (!) 143/75  Pulse: 91 88 85 85  Resp: '15 16 13 14  '$ Temp: 98.9 F (37.2 C)   98.4 F (36.9 C)  TempSrc: Oral   Oral  SpO2: 97% 100% 100% 98%  Weight:      Height:        SpO2: 98 %  No intake or output data in the 24 hours ending 05/31/21 0757 Filed Weights   05/30/21 2137  Weight: 97.5 kg    Exam: General exam: In no acute distress. Respiratory system: Good air movement and clear to auscultation. Cardiovascular system: S1 & S2 heard, RRR. No JVD. Gastrointestinal system: Abdomen is nondistended, soft and nontender.  Extremities: No pedal edema. Skin: No rashes, lesions or ulcers Data Reviewed:    Labs: Basic Metabolic Panel: Recent Labs  Lab 05/27/21 1100 05/30/21 1911  NA 138 139  K 3.3* 3.6  CL 106 108  CO2 23 22  GLUCOSE 168* 106*  BUN <5* 9  CREATININE 0.69 0.62  CALCIUM 8.9 9.0   GFR Estimated Creatinine Clearance: 74.1 mL/min (by C-G formula based on SCr of 0.62 mg/dL). Liver Function Tests: No results for input(s): AST, ALT, ALKPHOS, BILITOT, PROT, ALBUMIN in the last 168 hours. No results for input(s): LIPASE, AMYLASE in the last 168 hours. No results for input(s): AMMONIA in the last 168 hours. Coagulation profile Recent Labs  Lab 05/30/21 2347  INR 1.2   COVID-19 Labs  No results for input(s): DDIMER, FERRITIN, LDH, CRP in the last 72 hours.  Lab Results  Component Value Date   SARSCOV2NAA NEGATIVE 05/31/2021   [place] NEGATIVE 05/22/2021   [place] NEGATIVE 02/09/2021   [place] NEGATIVE 02/04/2021    CBC: Recent Labs  Lab 05/27/21 1100 05/30/21 1911  WBC 5.1 7.4  NEUTROABS  --  5.3  HGB 10.9* 10.4*  HCT 35.5* 33.0*  MCV 84.9 86.4  PLT 223 227   Cardiac Enzymes: No results for input(s): CKTOTAL, CKMB, CKMBINDEX, TROPONINI in the last 168 hours. BNP (last 3 results) No results for input(s): PROBNP in the last 8760 hours. CBG: Recent Labs  Lab 05/26/21 2037 05/27/21 0754 05/27/21 1148 05/27/21 1400 05/27/21 1650  GLUCAP 127* 103* 154* 177* 119*   D-Dimer: No results for input(s): DDIMER in the last 72 hours. Hgb A1c: No results for input(s): HGBA1C in the last 72  hours. Lipid Profile: No results for input(s): CHOL, HDL, LDLCALC, TRIG, CHOLHDL, LDLDIRECT in the last 72 hours. Thyroid function studies: No results for input(s): TSH, T4TOTAL, T3FREE, THYROIDAB in the last 72 hours.  Invalid input(s): FREET3 Anemia work up: No results for input(s): VITAMINB12, FOLATE, FERRITIN, TIBC, IRON, RETICCTPCT in the last 72 hours. Sepsis Labs: Recent Labs  Lab 05/27/21 1100 05/30/21 1911  WBC 5.1 7.4   Microbiology Recent Results (from the past 240 hour(s))  Resp Panel by RT-PCR (Flu A&B,  Covid) Nasopharyngeal Swab     Status: None   Collection Time: 05/22/21  6:48 PM   Specimen: Nasopharyngeal Swab; Nasopharyngeal(NP) swabs in vial transport medium  Result Value Ref Range Status   SARS Coronavirus 2 by RT PCR NEGATIVE NEGATIVE Final    Comment: (NOTE) SARS-CoV-2 target nucleic acids are NOT DETECTED.  The SARS-CoV-2 RNA is generally detectable in upper respiratory specimens during the acute phase of infection. The lowest concentration of SARS-CoV-2 viral copies this assay can detect is 138 copies/mL. A negative result does not preclude SARS-Cov-2 infection and should not be used as the sole basis for treatment or other patient management decisions. A negative result may occur with  improper specimen collection/handling, submission of specimen other than nasopharyngeal swab, presence of viral mutation(s) within the areas targeted by this assay, and inadequate number of viral copies(<138 copies/mL). A negative result must be combined with clinical observations, patient history, and epidemiological information. The expected result is Negative.  Fact Sheet for Patients:  EntrepreneurPulse.com.au  Fact Sheet for Healthcare Providers:  IncredibleEmployment.be  This test is no t yet approved or cleared by the [place] FDA and  has been authorized for detection and/or diagnosis of SARS-CoV-2 by FDA under an  Emergency Use Authorization (EUA). This EUA will remain  in effect (meaning this test can be used) for the duration of the COVID-19 declaration under Section 564(b)(1) of the Act, 21 U.S.C.section 360bbb-3(b)(1), unless the authorization is terminated  or revoked sooner.       Influenza A by PCR NEGATIVE NEGATIVE Final   Influenza B by PCR NEGATIVE NEGATIVE Final    Comment: (NOTE) The Xpert Xpress SARS-CoV-2/FLU/RSV plus assay is intended as an aid in the diagnosis of influenza from Nasopharyngeal swab specimens and should not be used as a sole basis for treatment. Nasal washings and aspirates are unacceptable for Xpert Xpress SARS-CoV-2/FLU/RSV testing.  Fact Sheet for Patients: EntrepreneurPulse.com.au  Fact Sheet for Healthcare Providers: IncredibleEmployment.be  This test is not yet approved or cleared by the [place] FDA and has been authorized for detection and/or diagnosis of SARS-CoV-2 by FDA under an Emergency Use Authorization (EUA). This EUA will remain in effect (meaning this test can be used) for the duration of the COVID-19 declaration under Section 564(b)(1) of the Act, 21 U.S.C. section 360bbb-3(b)(1), unless the authorization is terminated or revoked.  Performed at [facility], [place] [address], [place], [place] [address]   Resp Panel by RT-PCR (Flu A&B, Covid) Nasopharyngeal Swab     Status: None   Collection Time: 05/31/21 12:12 AM   Specimen: Nasopharyngeal Swab; Nasopharyngeal(NP) swabs in vial transport medium  Result Value Ref Range Status   SARS Coronavirus 2 by RT PCR NEGATIVE NEGATIVE Final    Comment: (NOTE) SARS-CoV-2 target nucleic acids are NOT DETECTED.  The SARS-CoV-2 RNA is generally detectable in upper respiratory specimens during the acute phase of infection. The lowest concentration of SARS-CoV-2 viral copies this assay can detect is 138 copies/mL. A negative result does not preclude  SARS-Cov-2 infection and should not be used as the sole basis for treatment or other patient management decisions. A negative result may occur with  improper specimen collection/handling, submission of specimen other than nasopharyngeal swab, presence of viral mutation(s) within the areas targeted by this assay, and inadequate number of viral copies(<138 copies/mL). A negative result must be combined with clinical observations, patient history, and epidemiological information. The expected result is Negative.  Fact Sheet for Patients:  EntrepreneurPulse.com.au  Fact Sheet for Healthcare Providers:  IncredibleEmployment.be  This test is no t yet approved or cleared by the [place] FDA and  has been authorized for detection and/or diagnosis of SARS-CoV-2 by FDA under an Emergency Use Authorization (EUA). This EUA will remain  in effect (meaning this test can be used) for the duration of the COVID-19 declaration under Section 564(b)(1) of the Act, 21 U.S.C.section 360bbb-3(b)(1), unless the authorization is terminated  or revoked sooner.       Influenza A by PCR NEGATIVE NEGATIVE Final   Influenza B by PCR NEGATIVE NEGATIVE Final    Comment: (NOTE) The Xpert Xpress SARS-CoV-2/FLU/RSV plus assay is intended as an aid in the diagnosis of influenza from Nasopharyngeal swab specimens and should not be used as a sole basis for treatment. Nasal washings and aspirates are unacceptable for Xpert Xpress SARS-CoV-2/FLU/RSV testing.  Fact Sheet for Patients: EntrepreneurPulse.com.au  Fact Sheet for Healthcare Providers: IncredibleEmployment.be  This test is not yet approved or cleared by the [place] FDA and has been authorized for detection and/or diagnosis of SARS-CoV-2 by FDA under an Emergency Use Authorization (EUA). This EUA will remain in effect (meaning this test can be used) for the duration of  the COVID-19 declaration under Section 564(b)(1) of the Act, 21 U.S.C. section 360bbb-3(b)(1), unless the authorization is terminated or revoked.  Performed at [facility], [place] [address], [place],  [address]      Medications:    Continuous Infusions:  heparin 1,500 Units/hr (05/30/21 2217)      LOS: 0 days   Charlynne Cousins  [facility]  05/31/2021, 7:57 AM

## 2021-05-31 NOTE — Progress Notes (Signed)
ANTICOAGULATION CONSULT NOTE - Follow-Up Consult  Pharmacy Consult for Heparin Indication:  VTE Treatment  Allergies  Allergen Reactions   Cephalexin Other (See Comments)    URINARY RETENTION = "blocked my urine"   Codeine Hives and Rash   Nucynta [Tapentadol Hcl] Other (See Comments)    Altered mental status    Patient Measurements: Height: '5\' 8"'$  (172.7 cm) Weight: 97.5 kg (214 lb 15.2 oz) IBW/kg (Calculated) : 63.9 Heparin Dosing Weight: 85.2 kg  Vital Signs: Temp: 98.4 F (36.9 C) (07/31 0747) Temp Source: Oral (07/31 0747) BP: 108/63 (07/31 1400) Pulse Rate: 80 (07/31 1400)  Labs: Recent Labs    05/30/21 1911 05/30/21 2347 05/31/21 0600 05/31/21 0802 05/31/21 1327  HGB 10.4*  --   --  9.6*  --   HCT 33.0*  --   --  31.9*  --   PLT 227  --   --  234  --   LABPROT  --  15.1  --  15.0  --   INR  --  1.2  --  1.2  --   HEPARINUNFRC  --   --  0.38  --  0.69  CREATININE 0.62  --   --  0.58  --   TROPONINIHS 7  --   --   --   --      Estimated Creatinine Clearance: 74.1 mL/min (by C-G formula based on SCr of 0.58 mg/dL).   Medical History: Past Medical History:  Diagnosis Date   Abnormal nuclear stress test 10/18/2013   Arm pain 10/18/2013   Arthritis    Cataract    RIGHT EYE   Chest pain 10/18/2013   Diabetes mellitus    dx over 5 yrs ago   GERD (gastroesophageal reflux disease)    Heart murmur    "yrs ago in [place] -- been here since 1999, "   History of hiatal hernia    Hypertension    Prolonged Q-T interval on ECG 04/20/2017   Pulmonary edema 04/20/2017    Medications:  (Not in a hospital admission) Scheduled:   docusate sodium  100 mg Oral BID   DULoxetine  20 mg Oral q morning   folic acid  1 mg Oral Daily   insulin aspart  0-5 Units Subcutaneous QHS   insulin aspart  0-9 Units Subcutaneous TID WC   losartan  50 mg Oral Daily   polyethylene glycol  17 g Oral BID   rosuvastatin  10 mg Oral Daily   sodium chloride flush  3 mL  Intravenous Q12H   tiZANidine  4 mg Oral q morning   Infusions:   sodium chloride     heparin 1,500 Units/hr (05/31/21 1227)    Assessment: 30 yof presenting with LLE swelling occurring overnight. Recent admission on July 22 for small bowel obstruction. Patient with history of extensive DVT in LLE in April of this year. During last admit, patient was started on IV heparin underwent thrombectomy and thrombolysis and then was transitioned to apixaban. Patient reports was not able to fill apixaban from [facility] upon discharge and has not been on any anticoagulation.  Pharmacy consulted for heparin infusion in setting of VTE. Heparin started at 1500 units/hr with bolus of 5000 units. Overnight heparin level at 0.38 no change at that time.  Repeat heparin level this afternoon resulting at 0.69 which is borderline supra-therapeutic. Level drawn appropriately.  Hemoglobin with 1pt drop overnight. No signs of bleeding per RN. Plt 2342 stable.  Goal of  Therapy:  Heparin level 0.3-0.7 units/ml Monitor platelets by anticoagulation protocol: Yes   Plan:  Will slightly reduce heparin infusion to 1450 units/hr Check anti-Xa level in 6 hours and daily while on heparin Continue to monitor H&H and platelets  Lorelei Pont, PharmD, BCPS 05/31/2021 2:36 PM ED Clinical Pharmacist -  [phone]

## 2021-06-01 ENCOUNTER — Inpatient Hospital Stay (HOSPITAL_COMMUNITY)
Admission: EM | Disposition: A | Discharge status: Home Health | Payer: Self-pay | Source: Home / Self Care | Attending: Internal Medicine

## 2021-06-01 DIAGNOSIS — I82402 Acute embolism and thrombosis of unspecified deep veins of left lower extremity: Secondary | ICD-10-CM

## 2021-06-01 DIAGNOSIS — I10 Essential (primary) hypertension: Secondary | ICD-10-CM

## 2021-06-01 DIAGNOSIS — E1140 Type 2 diabetes mellitus with diabetic neuropathy, unspecified: Secondary | ICD-10-CM

## 2021-06-01 HISTORY — PX: PERIPHERAL VASCULAR THROMBECTOMY: CATH118306

## 2021-06-01 LAB — CBC
HCT: 33.2 % — ABNORMAL LOW (ref 36.0–46.0)
Hemoglobin: 10.1 g/dL — ABNORMAL LOW (ref 12.0–15.0)
MCH: 26 pg (ref 26.0–34.0)
MCHC: 30.4 g/dL (ref 30.0–36.0)
MCV: 85.3 fL (ref 80.0–100.0)
Platelets: 234 K/uL (ref 150–400)
RBC: 3.89 MIL/uL (ref 3.87–5.11)
RDW: 16.2 % — ABNORMAL HIGH (ref 11.5–15.5)
WBC: 7.5 K/uL (ref 4.0–10.5)
nRBC: 0 % (ref 0.0–0.2)

## 2021-06-01 LAB — BASIC METABOLIC PANEL WITH GFR
Anion gap: 8 (ref 5–15)
BUN: 5 mg/dL — ABNORMAL LOW (ref 8–23)
CO2: 24 mmol/L (ref 22–32)
Calcium: 8.6 mg/dL — ABNORMAL LOW (ref 8.9–10.3)
Chloride: 105 mmol/L (ref 98–111)
Creatinine, Ser: 0.61 mg/dL (ref 0.44–1.00)
GFR, Estimated: >60 mL/min
Glucose, Bld: 179 mg/dL — ABNORMAL HIGH (ref 70–99)
Potassium: 3.6 mmol/L (ref 3.5–5.1)
Sodium: 137 mmol/L (ref 135–145)

## 2021-06-01 LAB — GLUCOSE, CAPILLARY
Glucose-Capillary: 96 mg/dL (ref 70–99)
Glucose-Capillary: 155 mg/dL — ABNORMAL HIGH (ref 70–99)
Glucose-Capillary: 85 mg/dL (ref 70–99)
Glucose-Capillary: 93 mg/dL (ref 70–99)

## 2021-06-01 LAB — HEPARIN LEVEL (UNFRACTIONATED): Heparin Unfractionated: 0.31 [IU]/mL (ref 0.30–0.70)

## 2021-06-01 SURGERY — PERIPHERAL VASCULAR THROMBECTOMY
Anesthesia: LOCAL

## 2021-06-01 MED ORDER — ACETAMINOPHEN 325 MG PO TABS
650.0000 mg | ORAL_TABLET | ORAL | Status: DC | PRN
  Filled 2021-06-01 (×2): qty 2

## 2021-06-01 MED ORDER — HEPARIN (PORCINE) IN NACL 1000-0.9 UT/500ML-% IV SOLN
INTRAVENOUS | Status: AC
  Filled 2021-06-01: qty 500

## 2021-06-01 MED ORDER — HEPARIN (PORCINE) IN NACL 1000-0.9 UT/500ML-% IV SOLN
INTRAVENOUS | Status: DC | PRN
  Administered 2021-06-01: 500 mL

## 2021-06-01 MED ORDER — LABETALOL HCL 5 MG/ML IV SOLN
INTRAVENOUS | Status: AC
  Filled 2021-06-01: qty 4

## 2021-06-01 MED ORDER — FENTANYL CITRATE (PF) 100 MCG/2ML IJ SOLN
INTRAMUSCULAR | Status: AC
  Filled 2021-06-01: qty 2

## 2021-06-01 MED ORDER — ASPIRIN EC 81 MG PO TBEC
81.0000 mg | DELAYED_RELEASE_TABLET | Freq: Every day | ORAL | Status: DC
  Administered 2021-06-01 – 2021-06-05 (×5): 81 mg via ORAL
  Filled 2021-06-01 (×5): qty 1

## 2021-06-01 MED ORDER — FENTANYL CITRATE (PF) 100 MCG/2ML IJ SOLN
INTRAMUSCULAR | Status: DC | PRN
  Administered 2021-06-01 (×2): 50 ug via INTRAVENOUS

## 2021-06-01 MED ORDER — IODIXANOL 320 MG/ML IV SOLN
INTRAVENOUS | Status: DC | PRN
  Administered 2021-06-01: 30 mL via INTRAVENOUS

## 2021-06-01 MED ORDER — HYDRALAZINE HCL 20 MG/ML IJ SOLN
INTRAMUSCULAR | Status: DC | PRN
Start: 2021-06-01 — End: 2021-06-01
  Administered 2021-06-01: 10 mg via INTRAVENOUS

## 2021-06-01 MED ORDER — SODIUM CHLORIDE 0.9% FLUSH: 3.0000 mL | INTRAVENOUS | Status: DC | PRN

## 2021-06-01 MED ORDER — HYDRALAZINE HCL 20 MG/ML IJ SOLN
5.0000 mg | INTRAMUSCULAR | Status: DC | PRN
Start: 2021-06-01 — End: 2021-06-03

## 2021-06-01 MED ORDER — MIDAZOLAM HCL 2 MG/2ML IJ SOLN
INTRAMUSCULAR | Status: AC
  Filled 2021-06-01: qty 2

## 2021-06-01 MED ORDER — LIDOCAINE HCL (PF) 1 % IJ SOLN
INTRAMUSCULAR | Status: AC
  Filled 2021-06-01: qty 30

## 2021-06-01 MED ORDER — HEPARIN SODIUM (PORCINE) 1000 UNIT/ML IJ SOLN
INTRAMUSCULAR | Status: DC | PRN
  Administered 2021-06-01: 5000 [IU] via INTRAVENOUS

## 2021-06-01 MED ORDER — SODIUM CHLORIDE 0.9 % IV SOLN: INTRAVENOUS | Status: AC

## 2021-06-01 MED ORDER — HYDRALAZINE HCL 20 MG/ML IJ SOLN
INTRAMUSCULAR | Status: AC
  Filled 2021-06-01: qty 1

## 2021-06-01 MED ORDER — MIDAZOLAM HCL 2 MG/2ML IJ SOLN
INTRAMUSCULAR | Status: DC | PRN
  Administered 2021-06-01 (×2): 1 mg via INTRAVENOUS

## 2021-06-01 MED ORDER — SODIUM CHLORIDE 0.9 % IV SOLN: 250.0000 mL | INTRAVENOUS | Status: DC | PRN

## 2021-06-01 MED ORDER — LIDOCAINE HCL (PF) 1 % IJ SOLN
INTRAMUSCULAR | Status: DC | PRN
  Administered 2021-06-01: 5 mL via INTRADERMAL

## 2021-06-01 MED ORDER — ACETAMINOPHEN 650 MG RE SUPP: 650.0000 mg | Freq: Four times a day (QID) | RECTAL | Status: DC | PRN

## 2021-06-01 MED ORDER — SODIUM CHLORIDE 0.9% FLUSH
3.0000 mL | Freq: Two times a day (BID) | INTRAVENOUS | Status: DC
  Administered 2021-06-01 – 2021-06-05 (×6): 3 mL via INTRAVENOUS

## 2021-06-01 MED ORDER — HEPARIN SODIUM (PORCINE) 1000 UNIT/ML IJ SOLN
INTRAMUSCULAR | Status: AC
  Filled 2021-06-01: qty 1

## 2021-06-01 MED ORDER — LABETALOL HCL 5 MG/ML IV SOLN
10.0000 mg | INTRAVENOUS | Status: DC | PRN
Start: 2021-06-01 — End: 2021-06-03
  Administered 2021-06-01: 10 mg via INTRAVENOUS

## 2021-06-01 MED ORDER — ACETAMINOPHEN 325 MG PO TABS
650.0000 mg | ORAL_TABLET | Freq: Four times a day (QID) | ORAL | Status: DC | PRN
  Administered 2021-06-04 (×2): 650 mg via ORAL

## 2021-06-01 SURGICAL SUPPLY — 18 items
BAG SNAP BAND KOVER 36X36 (MISCELLANEOUS) ×2 IMPLANT
BALLN ATLAS 16X40X75 (BALLOONS) ×2
BALLN MUSTANG 12X60X75 (BALLOONS) ×2
BALLOON ATLAS 16X40X75 (BALLOONS) IMPLANT
BALLOON MUSTANG 12X60X75 (BALLOONS) IMPLANT
CATH ANGIO 5F BER2 100CM (CATHETERS) ×1 IMPLANT
CATH RETRIEVER CLOT 16MMX105CM (CATHETERS) ×1 IMPLANT
CATH VISIONS PV .035 IVUS (CATHETERS) ×1 IMPLANT
COVER DOME SNAP 22 D (MISCELLANEOUS) ×1 IMPLANT
GLIDEWIRE ADV .035X260CM (WIRE) ×1 IMPLANT
KIT ENCORE 26 ADVANTAGE (KITS) ×1 IMPLANT
KIT MICROPUNCTURE NIT STIFF (SHEATH) ×1 IMPLANT
PROTECTION STATION PRESSURIZED (MISCELLANEOUS) ×2
SHEATH CLOT RETRIEVER (SHEATH) ×1 IMPLANT
SHEATH PINNACLE 8F 10CM (SHEATH) ×1 IMPLANT
SHEATH PROBE COVER 6X72 (BAG) ×1 IMPLANT
STATION PROTECTION PRESSURIZED (MISCELLANEOUS) IMPLANT
TRAY PV CATH (CUSTOM PROCEDURE TRAY) ×1 IMPLANT

## 2021-06-01 NOTE — Progress Notes (Signed)
ANTICOAGULATION CONSULT NOTE - Follow-Up Consult  Pharmacy Consult for Heparin Indication:  VTE Treatment  Allergies  Allergen Reactions   Cephalexin Other (See Comments)    URINARY RETENTION = "blocked my urine"   Codeine Hives and Rash   Nucynta [Tapentadol Hcl] Other (See Comments)    Altered mental status    Patient Measurements: Height: '5\' 8"'$  (172.7 cm) Weight: 97.5 kg (214 lb 15.2 oz) IBW/kg (Calculated) : 63.9 Heparin Dosing Weight: 85.2 kg  Vital Signs: Temp: 99.8 F (37.7 C) (08/01 0537) Temp Source: Oral (08/01 0537) BP: 122/61 (08/01 0537) Pulse Rate: 85 (08/01 0537)  Labs: Recent Labs    05/30/21 1911 05/30/21 2347 05/31/21 0600 05/31/21 0802 05/31/21 1327 05/31/21 2112 06/01/21 0640  HGB 10.4*  --   --  9.6*  --   --  10.1*  HCT 33.0*  --   --  31.9*  --   --  33.2*  PLT 227  --   --  234  --   --  234  LABPROT  --  15.1  --  15.0  --   --   --   INR  --  1.2  --  1.2  --   --   --   HEPARINUNFRC  --   --    < >  --  0.69 0.17* 0.31  CREATININE 0.62  --   --  0.58  --   --   --   TROPONINIHS 7  --   --   --   --   --   --    < > = values in this interval not displayed.     Estimated Creatinine Clearance: 74.1 mL/min (by C-G formula based on SCr of 0.58 mg/dL).   Medical History: Past Medical History:  Diagnosis Date   Abnormal nuclear stress test 10/18/2013   Arm pain 10/18/2013   Arthritis    Cataract    RIGHT EYE   Chest pain 10/18/2013   Diabetes mellitus    dx over 5 yrs ago   GERD (gastroesophageal reflux disease)    Heart murmur    "yrs ago in [place] -- been here since 1999, "   History of hiatal hernia    Hypertension    Prolonged Q-T interval on ECG 04/20/2017   Pulmonary edema 04/20/2017    Medications:   Scheduled:   Infusions:   sodium chloride     dextrose 5 % and 0.45 % NaCl with KCl 20 mEq/L 50 mL/hr at 05/31/21 2115   heparin 1,550 Units/hr (06/01/21 0852)    Assessment: 49 yof presenting with LLE swelling  occurring overnight. Recent admission on July 22 for small bowel obstruction. Patient with history of extensive DVT in LLE in April of this year. During last admit, patient was started on IV heparin underwent thrombectomy and thrombolysis and then was transitioned to apixaban. Patient reports was not able to fill apixaban from [facility] upon discharge and has not been on any anticoagulation.  Pharmacy consulted for heparin infusion in setting of VTE. Heparin started at 1500 units/hr with bolus of 5000 units.   Heparin level this am is therapeutic 0.31.  No known issues with IV infusion.  Goal of Therapy:  Heparin level 0.3-0.7 units/ml Monitor platelets by anticoagulation protocol: Yes   Plan:  Continue heparin infusion at 1550 units/hr Check anti-Xa level in 8 hours and daily while on heparin Continue to monitor H&H and platelets  Alanda Slim, PharmD, [facility] Clinical  Pharmacist Please see AMION for all Pharmacists' Contact Phone Numbers 06/01/2021, 9:06 AM

## 2021-06-01 NOTE — Interval H&P Note (Signed)
History and Physical Interval Note:  06/01/2021 1:21 PM  Diane [name]  has presented today for surgery, with the diagnosis of DVT.  The various methods of treatment have been discussed with the patient and family. After consideration of risks, benefits and other options for treatment, the patient has consented to  Procedure(s): PERIPHERAL VASCULAR THROMBECTOMY (N/A) as a surgical intervention.  The patient's history has been reviewed, patient examined, no change in status, stable for surgery.  I have reviewed the patient's chart and labs.  Questions were answered to the patient's satisfaction.     Servando Snare

## 2021-06-01 NOTE — Progress Notes (Signed)
Pt arrived from Cath lab, VSS, Telebox [id number], CHG complete, VSS, Oriented to unit, orders released, incision clean dry and intact level 0.   Chrisandra Carota, RN 06/01/2021 5:59 PM

## 2021-06-01 NOTE — Progress Notes (Signed)
Per RN IV team not needed at this time.

## 2021-06-01 NOTE — Op Note (Signed)
[name] MRN: [id number] DOB: 01-07-1946 Sex: female  06/01/2021 Pre-operative Diagnosis: Recurrent left lower extremity DVT Post-operative diagnosis:  Same Surgeon:  Erlene Quan C. Donzetta Matters, MD Procedure Performed: 1.  Ultrasound-guided cannulation left popliteal vein 2.  Left lower extremity venogram and central venogram 3.  Intravascular ultrasound of left femoral vein, left common femoral vein, left external and common iliac veins and IVC 4.  Balloon angioplasty of left common iliac vein with 16 mm balloon 5.  Balloon angioplasty of left external and common femoral vein with 12 mm balloon 6.  Mechanical thrombectomy with Inari Clottriever of left common iliac vein, left external iliac vein, left common femoral and femoral vein 7.  Moderate sedation with fentanyl and Versed for 59 minutes   Indications: 75 year old female recently had extensive left lower extremity DVT and underwent stenting of her left common and external iliac veins for May Thurner syndrome.  She had previously been on Eliquis but was off for several days.  She has a CT scan from approximately 10 days ago for acute abdominal pain and then presented with several days of acute left lower extremity swelling.  By duplex she was found to have occluded stent and veins in the left lower extremity and was indicated for mechanical thrombectomy versus lysis.  Findings: There is acute thrombus extending from the left popliteal vein through the femoral vein and the common femoral vein where there did appear to be some chronic thrombus as well.  The left external iliac vein and common iliac vein stent appeared to have acute thrombus in the external leg vein there is approximately 70% filled with thrombus in the common iliac vein.  We first balloon dilated the distal end of the stent into the IVC and perform mechanical thrombectomy with a total of 4 passes through the stent and an additional pass through the common femoral  vein on the left only.  We also balloon dilated the external leg vein and common femoral vein with a 12 mm balloon at completion.  There was flow channel from the popliteal vein through the femoral vein there was still some chronic appearing thrombus in the common femoral vein and the external iliac and common iliac vein stent was patent.  There was a flow channel without any reflux or collateralization by completion venogram.   Procedure:  The patient was identified in the holding area and taken to room 8.  The patient was then placed prone on the table and prepped and draped in the usual sterile fashion.  A time out was called.  Ultrasound was used to evaluate the left popliteal vein.  This was somewhat difficult to see but we anesthetized the area and cannulated with micropuncture needle followed by wire and sheath.  An image saved from record.  We then placed a Glidewire advantage and an 8 [place] sheath and the patient was given additional 5000 units of heparin she had recently been on heparin drip up to that point.  We then used a Berenstein catheter we placed the IVC perform venogram there confirmed intraluminal access.  We then placed the wire into the IJ and performed intravascular ultrasound from the popliteal vein including the femoral vein, common femoral vein, external and common iliac veins and the stent in the IVC.  There we found thrombosed veins from the popliteal all the way up to the external leg vein there did appear to be some channel in the common iliac vein stent but was also partially thrombosed.  There appeared to be some chronic appearing thrombus in the common femoral vein on the left.  A 16 mm balloon was used to dilate the stent particularly at the most distal area going into the IVC at nominal pressure.  We then placed an Inari sheath under fluoroscopic guidance and then plan for mechanical thrombectomy.  We performed mechanical thrombectomy through the stent we did watch with  magnified views.  We had a total of 4 passes through the stent and additional 1 pass in the common femoral vein with no additional clot returned.  The first 3 passes did have some clot the fourth pass with minimal residual clot in the first pass again with no clot returned.  We then again completed intravascular ultrasound from the popliteal vein including the femoral vein, common femoral vein, external leg vein stent and common iliac vein stent.  We then balloon dilated the external iliac vein and the common femoral vein with a 12 mm balloon.  Completion demonstrated approximately 50% chronic stenosis of the common femoral vein the other areas were patent and there was a flow channel throughout and the stent was patent without any residual stenosis or thrombus.  We then performed completion venography which demonstrated brisk flow without any collateralization or reflux.  Satisfied with this we again anesthetize the skin placed a 3-0 nylon suture and a bolster.  The sheath was removed.  She tolerated procedure without any complication.  Contrast: 30 cc   Devin Foskey C. Donzetta Matters, MD [facility] Office: [phone] Pager: [phone]

## 2021-06-01 NOTE — Progress Notes (Signed)
ANTICOAGULATION CONSULT NOTE - Follow-Up Consult  Pharmacy Consult for Heparin Indication:  VTE Treatment  Patient Measurements: Height: '5\' 8"'$  (172.7 cm) Weight: 96 kg (211 lb 10.3 oz) IBW/kg (Calculated) : 63.9 Heparin Dosing Weight: 85.2 kg  Vital Signs: Temp: 98.4 F (36.9 C) (08/01 1750) Temp Source: Oral (08/01 1750) BP: 153/83 (08/01 1750) Pulse Rate: 89 (08/01 1750)  Labs: Recent Labs    05/30/21 1911 05/30/21 2347 05/31/21 0600 05/31/21 0802 05/31/21 1327 05/31/21 2112 06/01/21 0640 06/01/21 1006  HGB 10.4*  --   --  9.6*  --   --  10.1*  --   HCT 33.0*  --   --  31.9*  --   --  33.2*  --   PLT 227  --   --  234  --   --  234  --   LABPROT  --  15.1  --  15.0  --   --   --   --   INR  --  1.2  --  1.2  --   --   --   --   HEPARINUNFRC  --   --    < >  --  0.69 0.17* 0.31  --   CREATININE 0.62  --   --  0.58  --   --   --  0.61  TROPONINIHS 7  --   --   --   --   --   --   --    < > = values in this interval not displayed.     Estimated Creatinine Clearance: 73.6 mL/min (by C-G formula based on SCr of 0.61 mg/dL).   Medications:   Scheduled:   Infusions:   sodium chloride 75 mL/hr at 06/01/21 1628   sodium chloride     dextrose 5 % and 0.45 % NaCl with KCl 20 mEq/L 50 mL/hr at 05/31/21 2115   heparin 1,550 Units/hr (06/01/21 0852)    Assessment: 32 yof presenting with LLE swelling occurring overnight. Recent admission on July 22 for small bowel obstruction. Patient with history of extensive DVT in LLE in April of this year. During last admit, patient was started on IV heparin underwent thrombectomy and thrombolysis and then was transitioned to apixaban. Patient reports was not able to fill apixaban from [facility] upon discharge and has not been on any anticoagulation. Pharmacy consulted for heparin infusion in setting of VTE.  The patient is s/p thrombectomy with VVS this afternoon. Heparin 5k bolus given intra-op and Heparin immediately resumed post-op.  Will time the next heparin check from restart.   Goal of Therapy:  Heparin level 0.3-0.7 units/ml Monitor platelets by anticoagulation protocol: Yes   Plan:  - Continue heparin infusion at 1550 units/hr - Will continue to monitor for any signs/symptoms of bleeding and will follow up with heparin level in 8 hours from restart  Thank you for allowing pharmacy to be a part of this patient's care.  Alycia Rossetti, PharmD, BCPS Clinical Pharmacist Clinical phone for 06/01/2021: [phone] 06/01/2021 6:30 PM   **Pharmacist phone directory can now be found on [email or web address] ([email or web address]).  Listed under [place].

## 2021-06-01 NOTE — Progress Notes (Signed)
IVT consulted for blood draw per pt request.  RN, Havy contacted and advised IVT unable to draw labs; no central line in place.  Primary RN to follow up w/MD for a plan.

## 2021-06-01 NOTE — Plan of Care (Signed)
  Problem: Education: Goal: Knowledge of General Education information will improve Description Including pain rating scale, medication(s)/side effects and non-pharmacologic comfort measures Outcome: Progressing   Problem: Health Behavior/Discharge Planning: Goal: Ability to manage health-related needs will improve Outcome: Progressing   

## 2021-06-01 NOTE — Progress Notes (Signed)
[facility] PROGRESS NOTE    Progress Note  Diane [name]  [id number] DOB: 11-03-1945 DOA: 05/30/2021 PCP: [name], MD     Brief Narrative:   Diane [name] is an 75 y.o. female past medical history significant for essential hypertension diabetes mellitus type 2 history of DVT recent small bowel obstruction comes in for evaluation of left lower extremity pain and swelling Doppler showed acute DVT and EMG of the chest showed no PE borderline cardiomegaly coronary calcification.   Assessment/Plan:   Left leg DVT [facility]): Question noncompliance with her medication. She was placed on heparin infusion vascular surgery consulted they recommended surgical intervention invention possibly proceed with mechanical thrombectomy versus thrombolysis on 06/01/2021 CT angio of the chest was negative. Keep her on IV heparin for 48 to 72 hours then can switch her back to Eliquis. She will need to follow-up with hematology as an outpatient.  Essential hypertension: Continue losartan, blood pressure is elevated likely due to pain. Continue to monitor closely.  Prolonged Q-T interval on ECG Jackie potassium greater than 4 magnesium greater than 2.  Type 2 diabetes mellitus with diabetic neuropathy, unspecified ([place]) Hold metformin in case contrast is needed, started on sliding scale insulin.   DVT prophylaxis: heparin Family Communication:none Status is: Observation  The patient will require care spanning > 2 midnights and should be moved to inpatient because: Hemodynamically unstable  Dispo: The patient is from: Home              Anticipated d/c is to: Home              Patient currently is not medically stable to d/c.   Difficult to place patient No        Code Status:  Code Status History     Date Active Date Inactive Code Status Order ID Comments User Context   05/22/2021 2119 05/27/2021 2307 Full Code [id number]  Elwyn Reach, MD Inpatient   02/04/2021 2235  02/10/2021 2154 Full Code [id number]  Shela Leff, MD ED   01/22/2021 1748 01/30/2021 2251 Full Code [id number]  Lequita Halt, MD Inpatient   06/11/2018 2324 06/15/2018 0119 Full Code [id number]  Vilma Prader, MD ED   06/02/2018 1246 06/04/2018 1349 Full Code [id number]  Netta Cedars, MD Inpatient   04/19/2017 2020 04/21/2017 2016 Full Code [id number]  Norval Morton, MD ED   06/15/2014 1026 06/16/2014 2125 Full Code [id number]  Jonetta Osgood, MD Inpatient   10/18/2013 2349 10/19/2013 1906 Full Code [id number]  Phillips Grout, MD Inpatient      Questions for Most Recent Historical Code Status (Order [id number])          IV Access:   Peripheral IV   Procedures and diagnostic studies:   DG Chest 1 View  Result Date: 05/30/2021 CLINICAL DATA:  Chest pain and shortness of breath. EXAM: CHEST  1 VIEW COMPARISON:  Chest radiograph 02/06/2021. FINDINGS: Stable cardiac and mediastinal contours. No consolidative pulmonary opacities. No pleural effusion or pneumothorax. IMPRESSION: No active disease. Electronically Signed   By: Lovey Newcomer M.D.   On: 05/30/2021 18:01   CT Angio Chest PE W/Cm &/Or Wo Cm  Result Date: 05/31/2021 CLINICAL DATA:  PE suspected, high prob Chest pain and shortness of breath. EXAM: CT ANGIOGRAPHY CHEST WITH CONTRAST TECHNIQUE: Multidetector CT imaging of the chest was performed using the standard protocol during bolus administration of intravenous contrast. Multiplanar CT image reconstructions and MIPs were obtained to evaluate the vascular anatomy.  CONTRAST:  23m OMNIPAQUE IOHEXOL 350 MG/ML SOLN COMPARISON:  Radiograph earlier today.  Chest CT 11/21/2018 FINDINGS: Cardiovascular: There are no filling defects within the pulmonary arteries to suggest pulmonary embolus. Evaluation is diagnostic to the distal segmental level. The subsegmental branches are not well assessed due to contrast bolus timing. Borderline cardiomegaly. There are coronary artery calcifications.  Atherosclerosis of the thoracic aorta without dissection or acute aortic findings. Conventional branching pattern from the aortic arch. No pericardial effusion. Mediastinum/Nodes: No enlarged mediastinal or hilar lymph nodes. Small hiatal hernia. No suspicious thyroid nodule. No axillary adenopathy Lungs/Pleura: Compressive atelectasis in the medial right lower lobe adjacent to thoracic osteophytes. Minor bandlike scarring in the dependent left lower lobe. No acute airspace disease. No pleural fluid. No findings of pulmonary edema. Trachea and central bronchi are patent. No pulmonary mass. Upper Abdomen: Cholecystectomy. No acute upper abdominal findings. Ingested material within the stomach. Musculoskeletal: Reverse right shoulder arthroplasty. Thoracic spondylosis with endplate spurring. There are no acute or suspicious osseous abnormalities. Review of the MIP images confirms the above findings. IMPRESSION: 1. No pulmonary embolus or acute intrathoracic abnormality. 2. Borderline cardiomegaly.  Coronary artery calcifications. 3. Small hiatal hernia. Aortic Atherosclerosis (ICD10-I70.0). Electronically Signed   By: MKeith RakeM.D.   On: 05/31/2021 00:17   VAS [place]OWER EXTREMITY VENOUS (DVT) (ONLY [facility] & [facility])  Result Date: 05/30/2021  Lower Venous DVT Study Patient Name:  MNADERA [name] Date of Exam:   05/30/2021 Medical Rec #: [id number]      Accession #:    [id number] of Birth: 603-27-1947      Patient Gender: F Patient Age:   48YEx[facility]:      VAS [place] EXTREMITY VENOUS (DVT) Referring Phys: [id number] Folk--------------------------------------------------------------------------------  Indications: Edema. Other Indications: Recent admission for small bowel obstruction 05/22/21. Risk Factors: Extensive left lower extremity DVT found 02/04/21. Left lower extremity thrombectomy of the IVC, CIV, CFV, and Popliteal veins done 02/06/21. Left common  external iliac artery venous stent Limitations: Edema, bowel gas and body habitus. Comparison Study: Prior study done 02/04/21 Performing Technologist: CSharion DoveRVS  Examination Guidelines: A complete evaluation includes B-mode imaging, spectral Doppler, color Doppler, and power Doppler as needed of all accessible portions of each vessel. Bilateral testing is considered an integral part of a complete examination. Limited examinations for reoccurring indications may be performed as noted. The reflux portion of the exam is performed with the patient in reverse Trendelenburg.  +-----+---------------+---------+-----------+----------+--------------+ RIGHTCompressibilityPhasicitySpontaneityPropertiesThrombus Aging +-----+---------------+---------+-----------+----------+--------------+ CFV  Full           Yes      Yes                                 +-----+---------------+---------+-----------+----------+--------------+   +---------+---------------+---------+-----------+----------+-------------------+ LEFT     CompressibilityPhasicitySpontaneityPropertiesThrombus Aging      +---------+---------------+---------+-----------+----------+-------------------+ CFV      None           No       No                   Acute               +---------+---------------+---------+-----------+----------+-------------------+ SFJ      None  Acute               +---------+---------------+---------+-----------+----------+-------------------+ FV Prox  None                                         Acute               +---------+---------------+---------+-----------+----------+-------------------+ FV Mid   None                                         Acute               +---------+---------------+---------+-----------+----------+-------------------+ FV DistalNone                                         Acute                +---------+---------------+---------+-----------+----------+-------------------+ PFV      None                                         Acute               +---------+---------------+---------+-----------+----------+-------------------+ POP      None           No       No                   Acute               +---------+---------------+---------+-----------+----------+-------------------+ PTV      None                                         Acute               +---------+---------------+---------+-----------+----------+-------------------+ PERO     None                                         Acute               +---------+---------------+---------+-----------+----------+-------------------+ CIV                                                   No color flow noted +---------+---------------+---------+-----------+----------+-------------------+ EIV                                                   Not visualized      +---------+---------------+---------+-----------+----------+-------------------+    Summary: RIGHT: - No evidence of common femoral vein obstruction.  LEFT: - Findings consistent with acute deep vein thrombosis involving the left common femoral vein, SF junction, left femoral vein, left proximal profunda vein, left popliteal vein, left  posterior tibial veins, left peroneal veins, and Common iliac.  *See table(s) above for measurements and observations.    Preliminary      Medical Consultants:   None.   Subjective:    Diane [name] continues to have lower extremity pain  Objective:    Vitals:   05/31/21 1606 05/31/21 2043 06/01/21 0059 06/01/21 0537  BP: (!) 111/59 (!) 141/69 119/62 122/61  Pulse: 77 88 84 85  Resp: '17 18 18 18  '$ Temp: 98.7 F (37.1 C) 97.9 F (36.6 C) 98.7 F (37.1 C) 99.8 F (37.7 C)  TempSrc: Oral Oral Oral Oral  SpO2: 100% 100% 100% 100%  Weight:      Height:       SpO2: 100 %   Intake/Output Summary (Last 24  hours) at 06/01/2021 0907 Last data filed at 06/01/2021 0600 Gross per 24 hour  Intake 1144.47 ml  Output 1200 ml  Net -55.53 ml   Filed Weights   05/30/21 2137  Weight: 97.5 kg    Exam: General exam: In no acute distress. Respiratory system: Good air movement and clear to auscultation. Cardiovascular system: S1 & S2 heard, RRR. No JVD. Gastrointestinal system: Abdomen is nondistended, soft and nontender.  Extremities: No pedal edema. Skin: No rashes, lesions or ulcers  Data Reviewed:    Labs: Basic Metabolic Panel: Recent Labs  Lab 05/27/21 1100 05/30/21 1911 05/31/21 0802  NA 138 139 142  K 3.3* 3.6 3.3*  CL 106 108 109  CO2 '23 22 24  '$ GLUCOSE 168* 106* 128*  BUN <5* 9 7*  CREATININE 0.69 0.62 0.58  CALCIUM 8.9 9.0 8.6*    GFR Estimated Creatinine Clearance: 74.1 mL/min (by C-G formula based on SCr of 0.58 mg/dL). Liver Function Tests: No results for input(s): AST, ALT, ALKPHOS, BILITOT, PROT, ALBUMIN in the last 168 hours. No results for input(s): LIPASE, AMYLASE in the last 168 hours. No results for input(s): AMMONIA in the last 168 hours. Coagulation profile Recent Labs  Lab 05/30/21 2347 05/31/21 0802  INR 1.2 1.2    COVID-19 Labs  No results for input(s): DDIMER, FERRITIN, LDH, CRP in the last 72 hours.  Lab Results  Component Value Date   SARSCOV2NAA NEGATIVE 05/31/2021   [place] NEGATIVE 05/22/2021   [place] NEGATIVE 02/09/2021   [place] NEGATIVE 02/04/2021    CBC: Recent Labs  Lab 05/27/21 1100 05/30/21 1911 05/31/21 0802 06/01/21 0640  WBC 5.1 7.4 8.0 7.5  NEUTROABS  --  5.3  --   --   HGB 10.9* 10.4* 9.6* 10.1*  HCT 35.5* 33.0* 31.9* 33.2*  MCV 84.9 86.4 86.7 85.3  PLT 223 227 234 234    Cardiac Enzymes: No results for input(s): CKTOTAL, CKMB, CKMBINDEX, TROPONINI in the last 168 hours. BNP (last 3 results) No results for input(s): PROBNP in the last 8760 hours. CBG: Recent Labs  Lab 05/27/21 1650  05/31/21 1224 05/31/21 1703 05/31/21 2135 06/01/21 0605  GLUCAP 119* 176* 103* 98 96    D-Dimer: No results for input(s): DDIMER in the last 72 hours. Hgb A1c: No results for input(s): HGBA1C in the last 72 hours. Lipid Profile: No results for input(s): CHOL, HDL, LDLCALC, TRIG, CHOLHDL, LDLDIRECT in the last 72 hours. Thyroid function studies: No results for input(s): TSH, T4TOTAL, T3FREE, THYROIDAB in the last 72 hours.  Invalid input(s): FREET3 Anemia work up: No results for input(s): VITAMINB12, FOLATE, FERRITIN, TIBC, IRON, RETICCTPCT in the last 72 hours. Sepsis Labs: Recent Labs  Lab 05/27/21 1100  05/30/21 1911 05/31/21 0802 06/01/21 0640  WBC 5.1 7.4 8.0 7.5    Microbiology Recent Results (from the past 240 hour(s))  Resp Panel by RT-PCR (Flu A&B, Covid) Nasopharyngeal Swab     Status: None   Collection Time: 05/22/21  6:48 PM   Specimen: Nasopharyngeal Swab; Nasopharyngeal(NP) swabs in vial transport medium  Result Value Ref Range Status   SARS Coronavirus 2 by RT PCR NEGATIVE NEGATIVE Final    Comment: (NOTE) SARS-CoV-2 target nucleic acids are NOT DETECTED.  The SARS-CoV-2 RNA is generally detectable in upper respiratory specimens during the acute phase of infection. The lowest concentration of SARS-CoV-2 viral copies this assay can detect is 138 copies/mL. A negative result does not preclude SARS-Cov-2 infection and should not be used as the sole basis for treatment or other patient management decisions. A negative result may occur with  improper specimen collection/handling, submission of specimen other than nasopharyngeal swab, presence of viral mutation(s) within the areas targeted by this assay, and inadequate number of viral copies(<138 copies/mL). A negative result must be combined with clinical observations, patient history, and epidemiological information. The expected result is Negative.  Fact Sheet for Patients:   EntrepreneurPulse.com.au  Fact Sheet for Healthcare Providers:  IncredibleEmployment.be  This test is no t yet approved or cleared by the [place] FDA and  has been authorized for detection and/or diagnosis of SARS-CoV-2 by FDA under an Emergency Use Authorization (EUA). This EUA will remain  in effect (meaning this test can be used) for the duration of the COVID-19 declaration under Section 564(b)(1) of the Act, 21 U.S.C.section 360bbb-3(b)(1), unless the authorization is terminated  or revoked sooner.       Influenza A by PCR NEGATIVE NEGATIVE Final   Influenza B by PCR NEGATIVE NEGATIVE Final    Comment: (NOTE) The Xpert Xpress SARS-CoV-2/FLU/RSV plus assay is intended as an aid in the diagnosis of influenza from Nasopharyngeal swab specimens and should not be used as a sole basis for treatment. Nasal washings and aspirates are unacceptable for Xpert Xpress SARS-CoV-2/FLU/RSV testing.  Fact Sheet for Patients: EntrepreneurPulse.com.au  Fact Sheet for Healthcare Providers: IncredibleEmployment.be  This test is not yet approved or cleared by the [place] FDA and has been authorized for detection and/or diagnosis of SARS-CoV-2 by FDA under an Emergency Use Authorization (EUA). This EUA will remain in effect (meaning this test can be used) for the duration of the COVID-19 declaration under Section 564(b)(1) of the Act, 21 U.S.C. section 360bbb-3(b)(1), unless the authorization is terminated or revoked.  Performed at [facility], [place] [address], [place], [place] [address]   Resp Panel by RT-PCR (Flu A&B, Covid) Nasopharyngeal Swab     Status: None   Collection Time: 05/31/21 12:12 AM   Specimen: Nasopharyngeal Swab; Nasopharyngeal(NP) swabs in vial transport medium  Result Value Ref Range Status   SARS Coronavirus 2 by RT PCR NEGATIVE NEGATIVE Final    Comment: (NOTE) SARS-CoV-2  target nucleic acids are NOT DETECTED.  The SARS-CoV-2 RNA is generally detectable in upper respiratory specimens during the acute phase of infection. The lowest concentration of SARS-CoV-2 viral copies this assay can detect is 138 copies/mL. A negative result does not preclude SARS-Cov-2 infection and should not be used as the sole basis for treatment or other patient management decisions. A negative result may occur with  improper specimen collection/handling, submission of specimen other than nasopharyngeal swab, presence of viral mutation(s) within the areas targeted by this assay, and inadequate number of  viral copies(<138 copies/mL). A negative result must be combined with clinical observations, patient history, and epidemiological information. The expected result is Negative.  Fact Sheet for Patients:  EntrepreneurPulse.com.au  Fact Sheet for Healthcare Providers:  IncredibleEmployment.be  This test is no t yet approved or cleared by the [place] FDA and  has been authorized for detection and/or diagnosis of SARS-CoV-2 by FDA under an Emergency Use Authorization (EUA). This EUA will remain  in effect (meaning this test can be used) for the duration of the COVID-19 declaration under Section 564(b)(1) of the Act, 21 U.S.C.section 360bbb-3(b)(1), unless the authorization is terminated  or revoked sooner.       Influenza A by PCR NEGATIVE NEGATIVE Final   Influenza B by PCR NEGATIVE NEGATIVE Final    Comment: (NOTE) The Xpert Xpress SARS-CoV-2/FLU/RSV plus assay is intended as an aid in the diagnosis of influenza from Nasopharyngeal swab specimens and should not be used as a sole basis for treatment. Nasal washings and aspirates are unacceptable for Xpert Xpress SARS-CoV-2/FLU/RSV testing.  Fact Sheet for Patients: EntrepreneurPulse.com.au  Fact Sheet for Healthcare  Providers: IncredibleEmployment.be  This test is not yet approved or cleared by the [place] FDA and has been authorized for detection and/or diagnosis of SARS-CoV-2 by FDA under an Emergency Use Authorization (EUA). This EUA will remain in effect (meaning this test can be used) for the duration of the COVID-19 declaration under Section 564(b)(1) of the Act, 21 U.S.C. section 360bbb-3(b)(1), unless the authorization is terminated or revoked.  Performed at [facility], [place] [address], [place], [place] [address]      Medications:    docusate sodium  100 mg Oral BID   DULoxetine  20 mg Oral q morning   folic acid  1 mg Oral Daily   insulin aspart  0-5 Units Subcutaneous QHS   insulin aspart  0-9 Units Subcutaneous TID WC   losartan  50 mg Oral Daily   polyethylene glycol  17 g Oral BID   rosuvastatin  10 mg Oral Daily   sodium chloride flush  3 mL Intravenous Q12H   tiZANidine  4 mg Oral q morning   Continuous Infusions:  sodium chloride     dextrose 5 % and 0.45 % NaCl with KCl 20 mEq/L 50 mL/hr at 05/31/21 2115   heparin 1,550 Units/hr (06/01/21 0852)      LOS: 1 day   Charlynne Cousins  [facility]  06/01/2021, 9:07 AM

## 2021-06-01 NOTE — Progress Notes (Signed)
Patient states that she is a difficult stick and usually the IV team draw her blood work-up since phlebotomist always have a hard time drawing her sample. RN consult IV team.

## 2021-06-01 NOTE — Care Management (Addendum)
Per chart discharged on 05/28/21 with [facility] for HHPT. Sent [facility] a message. Awaiting call back  [facility] with [facility] called. Patient was readmitted before they could see her at home. She will need orders for HHPT and face to face

## 2021-06-02 ENCOUNTER — Encounter (HOSPITAL_COMMUNITY): Payer: Self-pay | Admitting: Vascular Surgery

## 2021-06-02 ENCOUNTER — Other Ambulatory Visit (HOSPITAL_COMMUNITY): Payer: Self-pay

## 2021-06-02 DIAGNOSIS — I82402 Acute embolism and thrombosis of unspecified deep veins of left lower extremity: Secondary | ICD-10-CM | POA: Diagnosis not present

## 2021-06-02 LAB — BASIC METABOLIC PANEL
Anion gap: 8 (ref 5–15)
BUN: 6 mg/dL — ABNORMAL LOW (ref 8–23)
CO2: 24 mmol/L (ref 22–32)
Calcium: 8.5 mg/dL — ABNORMAL LOW (ref 8.9–10.3)
Chloride: 104 mmol/L (ref 98–111)
Creatinine, Ser: 0.58 mg/dL (ref 0.44–1.00)
GFR, Estimated: >60 mL/min (ref >60)
Glucose, Bld: 145 mg/dL — ABNORMAL HIGH (ref 70–99)
Potassium: 3.5 mmol/L (ref 3.5–5.1)
Sodium: 136 mmol/L (ref 135–145)

## 2021-06-02 LAB — CBC
HCT: 29.5 % — ABNORMAL LOW (ref 36.0–46.0)
Hemoglobin: 9.4 g/dL — ABNORMAL LOW (ref 12.0–15.0)
MCH: 26.7 pg (ref 26.0–34.0)
MCHC: 31.9 g/dL (ref 30.0–36.0)
MCV: 83.8 fL (ref 80.0–100.0)
Platelets: 223 10*3/uL (ref 150–400)
RBC: 3.52 MIL/uL — ABNORMAL LOW (ref 3.87–5.11)
RDW: 16.1 % — ABNORMAL HIGH (ref 11.5–15.5)
WBC: 9.3 10*3/uL (ref 4.0–10.5)
nRBC: 0 % (ref 0.0–0.2)

## 2021-06-02 LAB — GLUCOSE, CAPILLARY
Glucose-Capillary: 32 mg/dL — CL (ref 70–99)
Glucose-Capillary: 143 mg/dL — ABNORMAL HIGH (ref 70–99)
Glucose-Capillary: 165 mg/dL — ABNORMAL HIGH (ref 70–99)
Glucose-Capillary: 78 mg/dL (ref 70–99)
Glucose-Capillary: 97 mg/dL (ref 70–99)

## 2021-06-02 LAB — HEPARIN LEVEL (UNFRACTIONATED): Heparin Unfractionated: 0.37 IU/mL (ref 0.30–0.70)

## 2021-06-02 MED ORDER — APIXABAN 5 MG PO TABS: 5.0000 mg | ORAL_TABLET | Freq: Two times a day (BID) | ORAL | Status: DC

## 2021-06-02 MED ORDER — ASPIRIN EC 81 MG PO TBEC: 81.0000 mg | DELAYED_RELEASE_TABLET | Freq: Every day | ORAL | Status: DC

## 2021-06-02 MED ORDER — APIXABAN 5 MG PO TABS
10.0000 mg | ORAL_TABLET | Freq: Two times a day (BID) | ORAL | Status: DC
Start: 2021-06-02 — End: 2021-06-05
  Administered 2021-06-02 – 2021-06-05 (×7): 10 mg via ORAL
  Filled 2021-06-02 (×7): qty 2

## 2021-06-02 NOTE — Progress Notes (Signed)
Inpatient Diabetes Program Recommendations  AACE/ADA: New Consensus Statement on Inpatient Glycemic Control (2015)  Target Ranges:  Prepandial:   less than 140 mg/dL      Peak postprandial:   less than 180 mg/dL (1-2 hours)      Critically ill patients:  140 - 180 mg/dL   Lab Results  Component Value Date   GLUCAP 143 (H) 06/02/2021   HGBA1C 7.8 (H) 05/22/2021    Review of Glycemic Control Results for RIKKI, LIDDIARD (MRN [id number]) as of 06/02/2021 10:20  Ref. Range 06/01/2021 10:59 06/01/2021 16:26 06/01/2021 16:28 06/01/2021 21:04 06/02/2021 06:19  Glucose-Capillary Latest Ref Range: 70 - 99 mg/dL 155 (H) 32 (LL) 85 93 143 (H)   Diabetes history: DM 2 Outpatient Diabetes medications:  Metformin XR-500 mg bid Current orders for Inpatient glycemic control:  Novolog sensitive tid with meals and HS  Inpatient Diabetes Program Recommendations:    Please reduce Novolog correction to "very Sensitive".   Thanks,  Adah Perl, RN, BC-ADM Inpatient Diabetes Coordinator Pager [phone]  (8a-5p)

## 2021-06-02 NOTE — Progress Notes (Addendum)
   VASCULAR SURGERY ASSESSMENT & PLAN:   LEFT LOWER EXTREMITY DVT: The patient underwent mechanical thrombectomy yesterday with balloon angioplasty. Heparin infusion continues. Platelets WNL.   SUBJECTIVE:   Complaining of LLE soreness, but can now wiggle toes  PHYSICAL EXAM:   Vitals:   06/01/21 2140 06/01/21 2300 06/02/21 0411 06/02/21 0417  BP: (!) 152/59 134/66 124/65   Pulse: 88 91 86   Resp: '16 12 20   '$ Temp: 99.8 F (37.7 C) 98.8 F (37.1 C) 99.2 F (37.3 C)   TempSrc: Oral Oral Oral   SpO2: 100% 99% 100%   Weight:    96.4 kg  Height:       General appearance: Awake, alert in no apparent distress Cardiac: Heart rate and rhythm are regular Respirations: Nonlabored Extremities: LLE: mild edema with intact sensation and motor function.  Palpable left DP pulse.  LABS:   Lab Results  Component Value Date   WBC 9.3 06/02/2021   HGB 9.4 (L) 06/02/2021   HCT 29.5 (L) 06/02/2021   MCV 83.8 06/02/2021   PLT 223 06/02/2021   Lab Results  Component Value Date   CREATININE 0.58 06/02/2021   Lab Results  Component Value Date   INR 1.2 05/31/2021   CBG (last 3)  Recent Labs    06/01/21 1628 06/01/21 2104 06/02/21 0619  GLUCAP 85 93 143*    PROBLEM LIST:    Principal Problem:   Left leg DVT (HCC) Active Problems:   Hypertension   Prolonged Q-T interval on ECG   Type 2 diabetes mellitus with diabetic neuropathy, unspecified (HCC)   Acute DVT (deep venous thrombosis) (HCC)   CURRENT MEDS:    aspirin EC  81 mg Oral Daily   docusate sodium  100 mg Oral BID   DULoxetine  20 mg Oral q morning   folic acid  1 mg Oral Daily   insulin aspart  0-5 Units Subcutaneous QHS   insulin aspart  0-9 Units Subcutaneous TID WC   losartan  50 mg Oral Daily   polyethylene glycol  17 g Oral BID   rosuvastatin  10 mg Oral Daily   sodium chloride flush  3 mL Intravenous Q12H   tiZANidine  4 mg Oral q morning   Barbie Banner, PA-C  Office: [phone] 06/02/2021     I have independently interviewed and examined patient and agree with PA assessment and plan above.  Patient swelling much improved from preprocedure yesterday.  Bolster was removed from behind her left knee at the access site without complication.  I discussed the patient she will need to be compliant with her compression stockings and she does own multiple pairs of these.  She also needs to walk as much as tolerated with a dedicated walking program.  She will require Eliquis for the foreseeable future given rethrombosis of her stent and she should also be on aspirin as long as there are no contraindications.  She will follow-up with [place] in 6 to 8 weeks with IVC iliac duplex to evaluate her stent.  Gracy Ehly C. Donzetta Matters, MD [facility] [place] [facility] Office: [phone] Pager: [phone]

## 2021-06-02 NOTE — Discharge Instructions (Signed)
Information on my medicine - ELIQUIS (apixaban)  This medication education was reviewed with me or my healthcare representative as part of my discharge preparation.  The pharmacist that spoke with me during my hospital stay was:  Onnie Boer, RPH-CPP  Why was Eliquis prescribed for you? Eliquis was prescribed to treat blood clots that may have been found in the veins of your legs (deep vein thrombosis) or in your lungs (pulmonary embolism) and to reduce the risk of them occurring again.  What do You need to know about Eliquis ? The starting dose is 10 mg (two 5 mg tablets) taken TWICE daily for the FIRST SEVEN (7) DAYS, then on (enter date)  06/09/21  the dose is reduced to ONE 5 mg tablet taken TWICE daily.  Eliquis may be taken with or without food.   Try to take the dose about the same time in the morning and in the evening. If you have difficulty swallowing the tablet whole please discuss with your pharmacist how to take the medication safely.  Take Eliquis exactly as prescribed and DO NOT stop taking Eliquis without talking to the doctor who prescribed the medication.  Stopping may increase your risk of developing a new blood clot.  Refill your prescription before you run out.  After discharge, you should have regular check-up appointments with your healthcare provider that is prescribing your Eliquis.    What do you do if you miss a dose? If a dose of ELIQUIS is not taken at the scheduled time, take it as soon as possible on the same day and twice-daily administration should be resumed. The dose should not be doubled to make up for a missed dose.  Important Safety Information A possible side effect of Eliquis is bleeding. You should call your healthcare provider right away if you experience any of the following: Bleeding from an injury or your nose that does not stop. Unusual colored urine (red or dark brown) or unusual colored stools (red or black). Unusual bruising for unknown  reasons. A serious fall or if you hit your head (even if there is no bleeding).  Some medicines may interact with Eliquis and might increase your risk of bleeding or clotting while on Eliquis. To help avoid this, consult your healthcare provider or pharmacist prior to using any new prescription or non-prescription medications, including herbals, vitamins, non-steroidal anti-inflammatory drugs (NSAIDs) and supplements.  This website has more information on Eliquis (apixaban): http://www.eliquis.com/eliquis/home

## 2021-06-02 NOTE — Progress Notes (Signed)
[facility] PROGRESS NOTE    Progress Note  Diane [name]  [id number] DOB: 1946/03/20 DOA: 05/30/2021 PCP: [name], MD     Brief Narrative:   Diane [name] is an 75 y.o. female past medical history significant for essential hypertension diabetes mellitus type 2 history of DVT recent small bowel obstruction comes in for evaluation of left lower extremity pain and swelling Doppler showed acute DVT and EMG of the chest showed no PE borderline cardiomegaly coronary calcification.   Assessment/Plan:   Left leg DVT [facility]): CT angio of the chest was negative. Vascular surgery was consulted She was placed on heparin infusion, vascular surgery consulted they recommended surgical intervention invention possibly proceed with mechanical thrombectomy 06/01/2021 Vascular surgery recommended discontinue heparin started on Eliquis and aspirin. Further management per vascular surgery. PT to evaluate her  Essential hypertension: Continue losartan, blood pressure is elevated likely due to pain. Continue to monitor closely.  Prolonged Q-T interval on ECG Jackie potassium greater than 4 magnesium greater than 2.  Type 2 diabetes mellitus with diabetic neuropathy, unspecified ([place]) Hold metformin in case contrast is needed, started on sliding scale insulin.   DVT prophylaxis: heparin Family Communication:none Status is: Observation  The patient will require care spanning > 2 midnights and should be moved to inpatient because: Hemodynamically unstable  Dispo: The patient is from: Home              Anticipated d/c is to: Home              Patient currently is not medically stable to d/c.   Difficult to place patient No    Code Status:  Code Status History     Date Active Date Inactive Code Status Order ID Comments User Context   05/22/2021 2119 05/27/2021 2307 Full Code [id number]  Elwyn Reach, MD Inpatient   02/04/2021 2235 02/10/2021 2154 Full Code [id number]   Shela Leff, MD ED   01/22/2021 1748 01/30/2021 2251 Full Code [id number]  Lequita Halt, MD Inpatient   06/11/2018 2324 06/15/2018 0119 Full Code [id number]  Vilma Prader, MD ED   06/02/2018 1246 06/04/2018 1349 Full Code [id number]  Netta Cedars, MD Inpatient   04/19/2017 2020 04/21/2017 2016 Full Code [id number]  Norval Morton, MD ED   06/15/2014 1026 06/16/2014 2125 Full Code [id number]  Jonetta Osgood, MD Inpatient   10/18/2013 2349 10/19/2013 1906 Full Code [id number]  Phillips Grout, MD Inpatient      Questions for Most Recent Historical Code Status (Order [id number])          IV Access:   Peripheral IV   Procedures and diagnostic studies:   PERIPHERAL VASCULAR CATHETERIZATION  Result Date: 06/01/2021 Images from the original result were not included. [name] MRN: [id number] DOB: [birth year] Sex: female 06/01/2021 Pre-operative Diagnosis: Recurrent left lower extremity DVT Post-operative diagnosis:  Same Surgeon:  Erlene Quan C. Donzetta Matters, MD Procedure Performed: 1.  Ultrasound-guided cannulation left popliteal vein 2.  Left lower extremity venogram and central venogram 3.  Intravascular ultrasound of left femoral vein, left common femoral vein, left external and common iliac veins and IVC 4.  Balloon angioplasty of left common iliac vein with 16 mm balloon 5.  Balloon angioplasty of left external and common femoral vein with 12 mm balloon 6.  Mechanical thrombectomy with Inari Clottriever of left common iliac vein, left external iliac vein, left common femoral and femoral vein 7.  Moderate sedation with fentanyl and Versed for  30 minutes Indications: 75 year old female recently had extensive left lower extremity DVT and underwent stenting of her left common and external iliac veins for May Thurner syndrome.  She had previously been on Eliquis but was off for several days.  She has a CT scan from approximately 10 days ago for acute abdominal pain and then presented with  several days of acute left lower extremity swelling.  By duplex she was found to have occluded stent and veins in the left lower extremity and was indicated for mechanical thrombectomy versus lysis. Findings: There is acute thrombus extending from the left popliteal vein through the femoral vein and the common femoral vein where there did appear to be some chronic thrombus as well.  The left external iliac vein and common iliac vein stent appeared to have acute thrombus in the external leg vein there is approximately 70% filled with thrombus in the common iliac vein.  We first balloon dilated the distal end of the stent into the IVC and perform mechanical thrombectomy with a total of 4 passes through the stent and an additional pass through the common femoral vein on the left only.  We also balloon dilated the external leg vein and common femoral vein with a 12 mm balloon at completion.  There was flow channel from the popliteal vein through the femoral vein there was still some chronic appearing thrombus in the common femoral vein and the external iliac and common iliac vein stent was patent.  There was a flow channel without any reflux or collateralization by completion venogram.  Procedure:  The patient was identified in the holding area and taken to room 8.  The patient was then placed prone on the table and prepped and draped in the usual sterile fashion.  A time out was called.  Ultrasound was used to evaluate the left popliteal vein.  This was somewhat difficult to see but we anesthetized the area and cannulated with micropuncture needle followed by wire and sheath.  An image saved from record.  We then placed a Glidewire advantage and an 8 [place] sheath and the patient was given additional 5000 units of heparin she had recently been on heparin drip up to that point.  We then used a Berenstein catheter we placed the IVC perform venogram there confirmed intraluminal access.  We then placed the wire into the IJ  and performed intravascular ultrasound from the popliteal vein including the femoral vein, common femoral vein, external and common iliac veins and the stent in the IVC.  There we found thrombosed veins from the popliteal all the way up to the external leg vein there did appear to be some channel in the common iliac vein stent but was also partially thrombosed.  There appeared to be some chronic appearing thrombus in the common femoral vein on the left.  A 16 mm balloon was used to dilate the stent particularly at the most distal area going into the IVC at nominal pressure.  We then placed an Inari sheath under fluoroscopic guidance and then plan for mechanical thrombectomy.  We performed mechanical thrombectomy through the stent we did watch with magnified views.  We had a total of 4 passes through the stent and additional 1 pass in the common femoral vein with no additional clot returned.  The first 3 passes did have some clot the fourth pass with minimal residual clot in the first pass again with no clot returned.  We then again completed intravascular ultrasound from the popliteal vein  including the femoral vein, common femoral vein, external leg vein stent and common iliac vein stent.  We then balloon dilated the external iliac vein and the common femoral vein with a 12 mm balloon.  Completion demonstrated approximately 50% chronic stenosis of the common femoral vein the other areas were patent and there was a flow channel throughout and the stent was patent without any residual stenosis or thrombus.  We then performed completion venography which demonstrated brisk flow without any collateralization or reflux.  Satisfied with this we again anesthetize the skin placed a 3-0 nylon suture and a bolster.  The sheath was removed.  She tolerated procedure without any complication. Contrast: 30 cc Brandon C. Donzetta Matters, MD [facility] Office: [phone] Pager: [phone]     Medical  Consultants:   None.   Subjective:    Diane [name] left lower extremity pain better  Objective:    Vitals:   06/01/21 2300 06/02/21 0411 06/02/21 0417 06/02/21 0847  BP: 134/66 124/65  (!) 157/67  Pulse: 91 86  82  Resp: '12 20  16  '$ Temp: 98.8 F (37.1 C) 99.2 F (37.3 C)  98.7 F (37.1 C)  TempSrc: Oral Oral  Oral  SpO2: 99% 100%    Weight:   96.4 kg   Height:       SpO2: 100 % O2 Flow Rate (L/min): 2 L/min   Intake/Output Summary (Last 24 hours) at 06/02/2021 0851 Last data filed at 06/02/2021 0848 Gross per 24 hour  Intake 1086.21 ml  Output 1050 ml  Net 36.21 ml    Filed Weights   05/30/21 2137 06/01/21 1750 06/02/21 0417  Weight: 97.5 kg 96 kg 96.4 kg    Exam: General exam: In no acute distress. Respiratory system: Good air movement and clear to auscultation. Cardiovascular system: S1 & S2 heard, RRR. No JVD. Gastrointestinal system: Abdomen is nondistended, soft and nontender.  Extremities: No pedal edema. Skin: No rashes, lesions or ulcers  Data Reviewed:    Labs: Basic Metabolic Panel: Recent Labs  Lab 05/27/21 1100 05/30/21 1911 05/31/21 0802 06/01/21 1006 06/02/21 0218  NA 138 139 142 137 136  K 3.3* 3.6 3.3* 3.6 3.5  CL 106 108 109 105 104  CO2 '23 22 24 24 24  '$ GLUCOSE 168* 106* 128* 179* 145*  BUN <5* 9 7* 5* 6*  CREATININE 0.69 0.62 0.58 0.61 0.58  CALCIUM 8.9 9.0 8.6* 8.6* 8.5*    GFR Estimated Creatinine Clearance: 73.8 mL/min (by C-G formula based on SCr of 0.58 mg/dL). Liver Function Tests: No results for input(s): AST, ALT, ALKPHOS, BILITOT, PROT, ALBUMIN in the last 168 hours. No results for input(s): LIPASE, AMYLASE in the last 168 hours. No results for input(s): AMMONIA in the last 168 hours. Coagulation profile Recent Labs  Lab 05/30/21 2347 05/31/21 0802  INR 1.2 1.2    COVID-19 Labs  No results for input(s): DDIMER, FERRITIN, LDH, CRP in the last 72 hours.  Lab Results  Component Value Date   SARSCOV2NAA  NEGATIVE 05/31/2021   SARSCOV2NAA NEGATIVE 05/22/2021   SARSCOV2NAA NEGATIVE 02/09/2021   [place] NEGATIVE 02/04/2021    CBC: Recent Labs  Lab 05/27/21 1100 05/30/21 1911 05/31/21 0802 06/01/21 0640 06/02/21 0218  WBC 5.1 7.4 8.0 7.5 9.3  NEUTROABS  --  5.3  --   --   --   HGB 10.9* 10.4* 9.6* 10.1* 9.4*  HCT 35.5* 33.0* 31.9* 33.2* 29.5*  MCV 84.9 86.4 86.7 85.3 83.8  PLT 223 227 234 234 223    Cardiac Enzymes: No results for input(s): CKTOTAL, CKMB, CKMBINDEX, TROPONINI in the last 168 hours. BNP (last 3 results) No results for input(s): PROBNP in the last 8760 hours. CBG: Recent Labs  Lab 06/01/21 1059 06/01/21 1626 06/01/21 1628 06/01/21 2104 06/02/21 0619  GLUCAP 155* 32* 85 93 143*    D-Dimer: No results for input(s): DDIMER in the last 72 hours. Hgb A1c: No results for input(s): HGBA1C in the last 72 hours. Lipid Profile: No results for input(s): CHOL, HDL, LDLCALC, TRIG, CHOLHDL, LDLDIRECT in the last 72 hours. Thyroid function studies: No results for input(s): TSH, T4TOTAL, T3FREE, THYROIDAB in the last 72 hours.  Invalid input(s): FREET3 Anemia work up: No results for input(s): VITAMINB12, FOLATE, FERRITIN, TIBC, IRON, RETICCTPCT in the last 72 hours. Sepsis Labs: Recent Labs  Lab 05/30/21 1911 05/31/21 0802 06/01/21 0640 06/02/21 0218  WBC 7.4 8.0 7.5 9.3    Microbiology Recent Results (from the past 240 hour(s))  Resp Panel by RT-PCR (Flu A&B, Covid) Nasopharyngeal Swab     Status: None   Collection Time: 05/31/21 12:12 AM   Specimen: Nasopharyngeal Swab; Nasopharyngeal(NP) swabs in vial transport medium  Result Value Ref Range Status   SARS Coronavirus 2 by RT PCR NEGATIVE NEGATIVE Final    Comment: (NOTE) SARS-CoV-2 target nucleic acids are NOT DETECTED.  The SARS-CoV-2 RNA is generally detectable in upper respiratory specimens during the acute phase of infection. The lowest concentration of SARS-CoV-2 viral copies this assay  can detect is 138 copies/mL. A negative result does not preclude SARS-Cov-2 infection and should not be used as the sole basis for treatment or other patient management decisions. A negative result may occur with  improper specimen collection/handling, submission of specimen other than nasopharyngeal swab, presence of viral mutation(s) within the areas targeted by this assay, and inadequate number of viral copies(<138 copies/mL). A negative result must be combined with clinical observations, patient history, and epidemiological information. The expected result is Negative.  Fact Sheet for Patients:  EntrepreneurPulse.com.au  Fact Sheet for Healthcare Providers:  IncredibleEmployment.be  This test is no t yet approved or cleared by the [place] FDA and  has been authorized for detection and/or diagnosis of SARS-CoV-2 by FDA under an Emergency Use Authorization (EUA). This EUA will remain  in effect (meaning this test can be used) for the duration of the COVID-19 declaration under Section 564(b)(1) of the Act, 21 U.S.C.section 360bbb-3(b)(1), unless the authorization is terminated  or revoked sooner.       Influenza A by PCR NEGATIVE NEGATIVE Final   Influenza B by PCR NEGATIVE NEGATIVE Final    Comment: (NOTE) The Xpert Xpress SARS-CoV-2/FLU/RSV plus assay is intended as an aid in the diagnosis of influenza from Nasopharyngeal swab specimens and should not be used as a sole basis for treatment. Nasal washings and aspirates are unacceptable for Xpert Xpress SARS-CoV-2/FLU/RSV testing.  Fact Sheet for Patients: EntrepreneurPulse.com.au  Fact Sheet for Healthcare Providers: IncredibleEmployment.be  This test is not yet approved or cleared by the [place] FDA and has been authorized for detection and/or diagnosis of SARS-CoV-2 by FDA under an Emergency Use Authorization (EUA). This EUA will  remain in effect (meaning this test can be used) for the duration of the COVID-19 declaration under Section 564(b)(1) of the Act, 21 U.S.C. section 360bbb-3(b)(1), unless the authorization is terminated or revoked.  Performed at [facility], [place] [address] [place], [place] [address]  Medications:    aspirin EC  81 mg Oral Daily   docusate sodium  100 mg Oral BID   DULoxetine  20 mg Oral q morning   folic acid  1 mg Oral Daily   insulin aspart  0-5 Units Subcutaneous QHS   insulin aspart  0-9 Units Subcutaneous TID WC   losartan  50 mg Oral Daily   polyethylene glycol  17 g Oral BID   rosuvastatin  10 mg Oral Daily   sodium chloride flush  3 mL Intravenous Q12H   tiZANidine  4 mg Oral q morning   Continuous Infusions:  sodium chloride     dextrose 5 % and 0.45 % NaCl with KCl 20 mEq/L 50 mL/hr at 06/01/21 2131   heparin 1,550 Units/hr (06/02/21 0409)      LOS: 2 days   Diane [name]  [facility]  06/02/2021, 8:51 AM

## 2021-06-02 NOTE — Progress Notes (Signed)
ANTICOAGULATION CONSULT NOTE - Follow-Up Consult  Pharmacy Consult for Heparin Indication:  VTE Treatment  Patient Measurements: Height: '5\' 8"'$  (172.7 cm) Weight: 96 kg (211 lb 10.3 oz) IBW/kg (Calculated) : 63.9 Heparin Dosing Weight: 85.2 kg  Vital Signs: Temp: 98.8 F (37.1 C) (08/01 2300) Temp Source: Oral (08/01 2300) BP: 134/66 (08/01 2300) Pulse Rate: 91 (08/01 2300)  Labs: Recent Labs    05/30/21 1911 05/30/21 2347 05/31/21 0600 05/31/21 0802 05/31/21 1327 05/31/21 2112 06/01/21 0640 06/01/21 1006 06/02/21 0218  HGB 10.4*  --   --  9.6*  --   --  10.1*  --  9.4*  HCT 33.0*  --   --  31.9*  --   --  33.2*  --  29.5*  PLT 227  --   --  234  --   --  234  --  223  LABPROT  --  15.1  --  15.0  --   --   --   --   --   INR  --  1.2  --  1.2  --   --   --   --   --   HEPARINUNFRC  --   --    < >  --    < > 0.17* 0.31  --  0.37  CREATININE 0.62  --   --  0.58  --   --   --  0.61  --   TROPONINIHS 7  --   --   --   --   --   --   --   --    < > = values in this interval not displayed.     Estimated Creatinine Clearance: 73.6 mL/min (by C-G formula based on SCr of 0.61 mg/dL).   Medications:   Scheduled:   Infusions:   sodium chloride     dextrose 5 % and 0.45 % NaCl with KCl 20 mEq/L 50 mL/hr at 06/01/21 2131   heparin 1,550 Units/hr (06/01/21 0852)    Assessment: 53 yof presenting with LLE swelling occurring overnight. Recent admission on July 22 for small bowel obstruction. Patient with history of extensive DVT in LLE in April of this year. During last admit, patient was started on IV heparin underwent thrombectomy and thrombolysis and then was transitioned to apixaban. Patient reports was not able to fill apixaban from [facility] upon discharge and has not been on any anticoagulation. Pharmacy consulted for heparin infusion in setting of VTE.  8/2 AM update:  Heparin level therapeutic x 2  S/P thrombectomy yesterday  Goal of Therapy:  Heparin level  0.3-0.7 units/ml Monitor platelets by anticoagulation protocol: Yes   Plan:  -Cont heparin at 1550 units/hr -Daily CBC/heparin level -Monitor for bleeding -F/U oral anti-coagulation plans  Narda Bonds, PharmD, BCPS Clinical Pharmacist Phone: [phone]

## 2021-06-02 NOTE — TOC Benefit Eligibility Note (Signed)
Patient Teacher, English as a foreign language completed.    The patient is currently admitted and upon discharge could be taking Eliquis 5 mg.  The current 30 day co-pay is, $47.00.   The patient is insured through [place] Patient [facility]ocate [facility]ist [place] [employer] Direct Number: [phone]  Fax: [phone]

## 2021-06-03 ENCOUNTER — Inpatient Hospital Stay (HOSPITAL_COMMUNITY): Payer: Medicare Other

## 2021-06-03 DIAGNOSIS — I82402 Acute embolism and thrombosis of unspecified deep veins of left lower extremity: Secondary | ICD-10-CM

## 2021-06-03 LAB — GLUCOSE, CAPILLARY
Glucose-Capillary: 112 mg/dL — ABNORMAL HIGH (ref 70–99)
Glucose-Capillary: 192 mg/dL — ABNORMAL HIGH (ref 70–99)
Glucose-Capillary: 94 mg/dL (ref 70–99)
Glucose-Capillary: 120 mg/dL — ABNORMAL HIGH (ref 70–99)

## 2021-06-03 LAB — CBC
HCT: 27.8 % — ABNORMAL LOW (ref 36.0–46.0)
Hemoglobin: 8.8 g/dL — ABNORMAL LOW (ref 12.0–15.0)
MCH: 26.7 pg (ref 26.0–34.0)
MCHC: 31.7 g/dL (ref 30.0–36.0)
MCV: 84.2 fL (ref 80.0–100.0)
Platelets: 234 10*3/uL (ref 150–400)
RBC: 3.3 MIL/uL — ABNORMAL LOW (ref 3.87–5.11)
RDW: 16 % — ABNORMAL HIGH (ref 11.5–15.5)
WBC: 6.8 10*3/uL (ref 4.0–10.5)
nRBC: 0 % (ref 0.0–0.2)

## 2021-06-03 LAB — BASIC METABOLIC PANEL
Anion gap: 7 (ref 5–15)
BUN: 5 mg/dL — ABNORMAL LOW (ref 8–23)
CO2: 23 mmol/L (ref 22–32)
Calcium: 8.3 mg/dL — ABNORMAL LOW (ref 8.9–10.3)
Chloride: 110 mmol/L (ref 98–111)
Creatinine, Ser: 0.63 mg/dL (ref 0.44–1.00)
GFR, Estimated: >60 mL/min (ref >60)
Glucose, Bld: 113 mg/dL — ABNORMAL HIGH (ref 70–99)
Potassium: 3.4 mmol/L — ABNORMAL LOW (ref 3.5–5.1)
Sodium: 140 mmol/L (ref 135–145)

## 2021-06-03 IMAGING — DX DG ABDOMEN 1V
1 series · 2 of 2 positions shown · non-contrast
Comparison: 05/27/2021 and overlapping portion of chest CT from
05/31/2021

CLINICAL DATA: Abdominal distension

EXAM:
ABDOMEN - 1 VIEW

[Series 1: abdomen · 0.14mm/px · 2 of 2 slices shown]
[im 1/2]
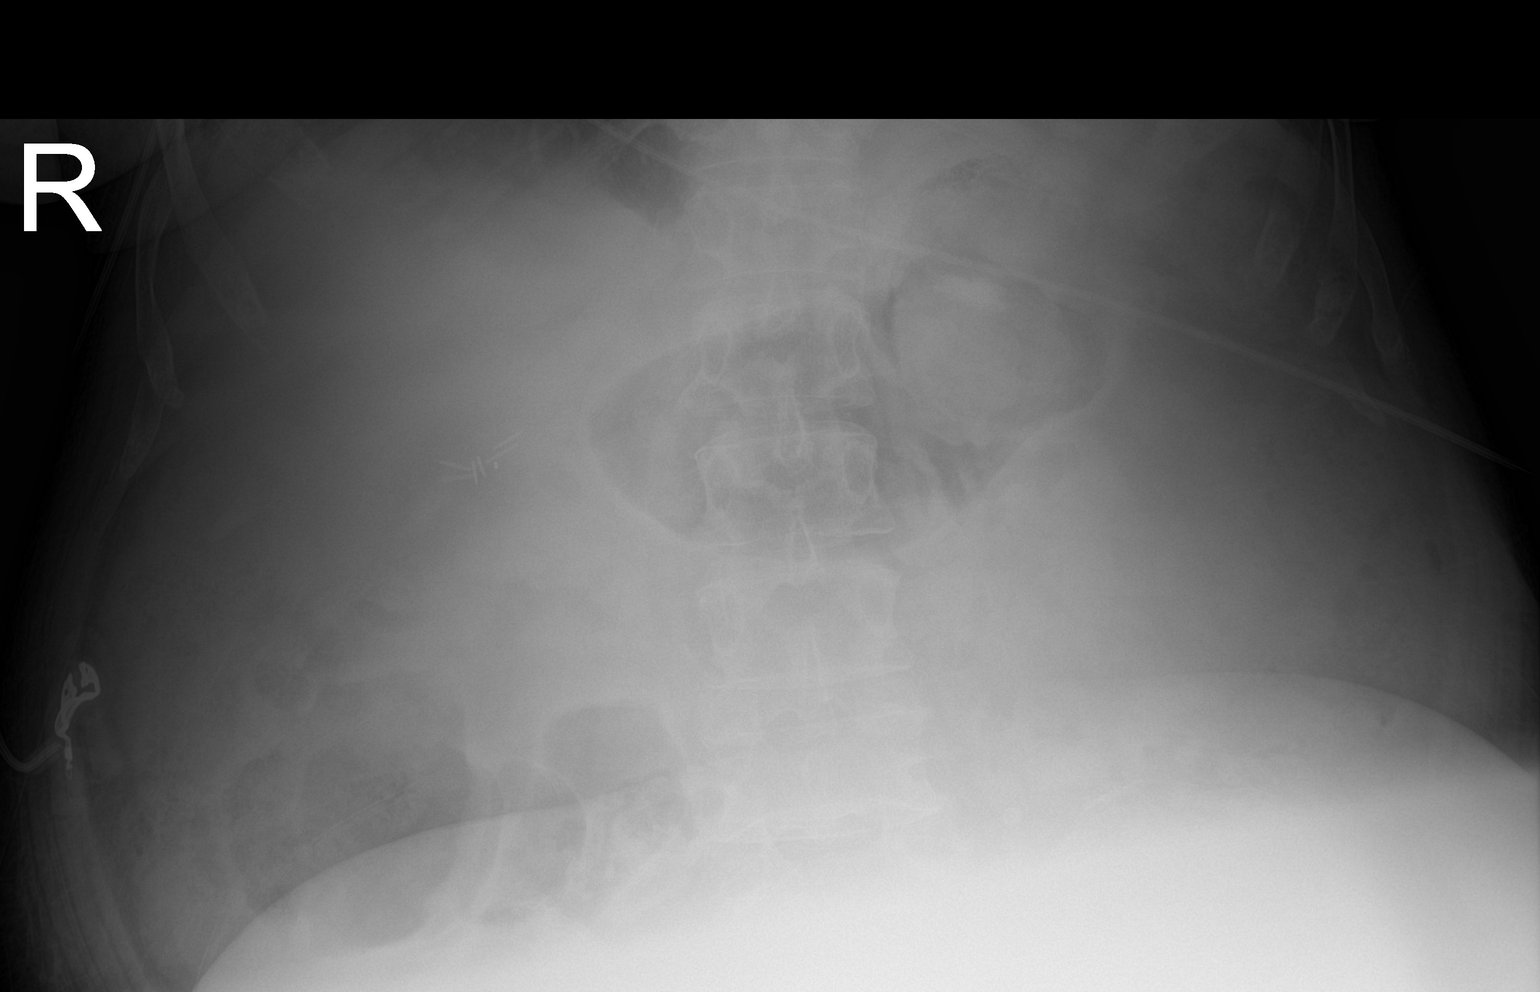
[im 2/2]
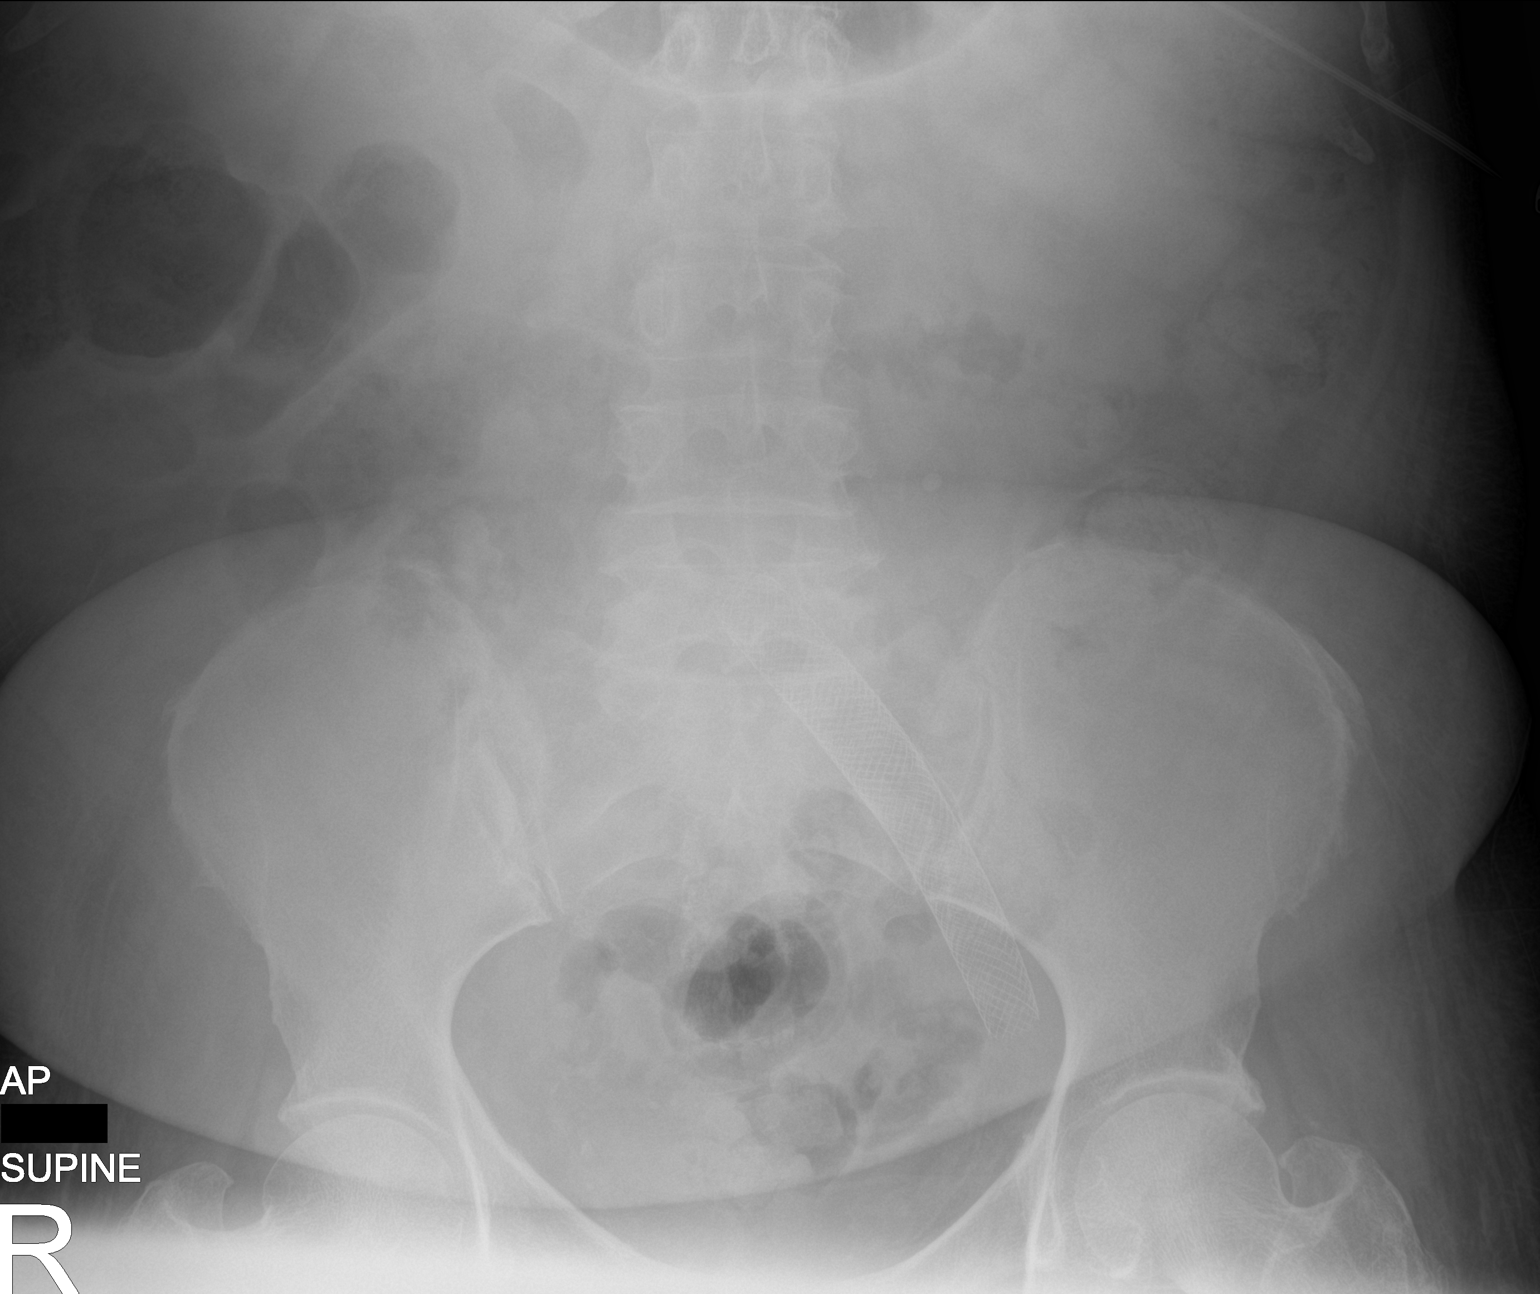

[2 of 2 positions shown; findings below may reference images not displayed]

FINDINGS: Gas and stool are present in the colon. No dilated bowel. Left
common iliac vein stent. Cholecystectomy clips noted. Probable
vascular calcification to the left of the L4 vertebral body based on
comparison prior CT scan 05/22/2021.
IMPRESSION: 1. No dilated bowel or specific bowel gas abnormality observed. No
appreciable abnormality to account for the reported abdominal
distension.

## 2021-06-03 MED ORDER — HYDRALAZINE HCL 20 MG/ML IJ SOLN
10.0000 mg | INTRAMUSCULAR | Status: DC | PRN
  Administered 2021-06-03: 10 mg via INTRAVENOUS
  Filled 2021-06-03: qty 1

## 2021-06-03 MED ORDER — SENNOSIDES-DOCUSATE SODIUM 8.6-50 MG PO TABS
2.0000 | ORAL_TABLET | Freq: Every day | ORAL | Status: DC
  Administered 2021-06-03: 2 via ORAL
  Filled 2021-06-03: qty 2

## 2021-06-03 MED ORDER — SENNOSIDES-DOCUSATE SODIUM 8.6-50 MG PO TABS: 1.0000 | ORAL_TABLET | Freq: Every day | ORAL | Status: DC

## 2021-06-03 MED ORDER — SENNOSIDES-DOCUSATE SODIUM 8.6-50 MG PO TABS: 1.0000 | ORAL_TABLET | Freq: Every evening | ORAL | Status: DC | PRN

## 2021-06-03 MED ORDER — METOPROLOL TARTRATE 5 MG/5ML IV SOLN: 5.0000 mg | INTRAVENOUS | Status: DC | PRN

## 2021-06-03 MED ORDER — POTASSIUM CHLORIDE CRYS ER 20 MEQ PO TBCR
40.0000 meq | EXTENDED_RELEASE_TABLET | Freq: Once | ORAL | Status: AC
  Administered 2021-06-03: 40 meq via ORAL
  Filled 2021-06-03: qty 2

## 2021-06-03 NOTE — Progress Notes (Addendum)
PROGRESS NOTE    Diane [name]  [id number] DOB: 03/30/1946 DOA: 05/30/2021 PCP: [name], MD   Brief Narrative:  75 y.o. female past medical history significant for essential hypertension diabetes mellitus type 2 history of DVT recent small bowel obstruction comes in for evaluation of left lower extremity pain and swelling Doppler showed acute DVT and EMG of the chest showed no PE borderline cardiomegaly coronary calcification.   Assessment & Plan:   Principal Problem:   Left leg DVT (HCC) Active Problems:   Hypertension   Prolonged Q-T interval on ECG   Type 2 diabetes mellitus with diabetic neuropathy, unspecified (HCC)   Acute DVT (deep venous thrombosis) (HCC)   Left leg DVT [facility]): Peripheral vascular disease -CTA chest negative for PE.  Seen by vascular - Status post surgical intervention 8/1-mechanical thrombectomy with balloon angioplasty - Eliquis twice daily.  Follow-up outpatient vascular 6-8 weeks with IVC iliac duplex to reevaluate stent  Diminished bowel sounds/constipation - Does have history of small bowel obstructions.  History of cholecystectomy, hysterectomy, hernia repair, previous bowel repair.  Will order abdominal x-ray.  Bowel regimen.   Essential hypertension: -Continue losartan.  IV hydralazine as needed   Prolonged Q-T interval on ECG -Monitor electrolytes   Type 2 diabetes mellitus with diabetic neuropathy, unspecified (HCC) -Currently metformin on hold.  Insulin sliding scale and Accu-Cheks - We will need to discontinue D5 fluids to make sure sugar remained stable  DVT prophylaxis: Eliquis Code Status: Full Family Communication:  Daughter - Lauro Regulus updated.   Status is: Inpatient  Remains inpatient appropriate because:Inpatient level of care appropriate due to severity of illness  Dispo: The patient is from: Home              Anticipated d/c is to: Home              Patient currently is not medically stable to d/c.  Having  abdominal discomfort, undergoing evaluation.  Also will need to maintain his blood glucose level off D5 fluids.   Difficult to place patient No       Subjective: Sitting up in the chair, reports of Right LQ abd pain. She has not had a BM in the last 3-4 days.  Remains afebrile.   Review of Systems Otherwise negative except as per HPI, including: General: Denies fever, chills, night sweats or unintended weight loss. Resp: Denies cough, wheezing, shortness of breath. Cardiac: Denies chest pain, palpitations, orthopnea, paroxysmal nocturnal dyspnea. GI: Denies abdominal pain, nausea, vomiting, diarrhea or constipation GU: Denies dysuria, frequency, hesitancy or incontinence MS: Denies muscle aches, joint pain or swelling Neuro: Denies headache, neurologic deficits (focal weakness, numbness, tingling), abnormal gait Psych: Denies anxiety, depression, SI/HI/AVH Skin: Denies new rashes or lesions ID: Denies sick contacts, exotic exposures, travel  Examination:  General exam: Appears calm and comfortable  Respiratory system: Clear to auscultation. Respiratory effort normal. Cardiovascular system: S1 & S2 heard, RRR. No JVD, murmurs, rubs, gallops or clicks. No pedal edema. Gastrointestinal system: Abdomen is nondistended, soft and nontender. No organomegaly or masses felt. Diminished BS Central nervous system: Alert and oriented. No focal neurological deficits. Extremities: Symmetric 5 x 5 power. Skin: No rashes, lesions or ulcers Psychiatry: Judgement and insight appear normal. Mood & affect appropriate.     Objective: Vitals:   06/02/21 2028 06/02/21 2332 06/03/21 0450 06/03/21 0811  BP: 133/76 (!) 151/66 (!) 159/91 (!) 159/81  Pulse: 85 85 82 81  Resp: '17 20 20 18  '$ Temp: 98.3 F (36.8  C) 98.4 F (36.9 C) 98.4 F (36.9 C) 98.5 F (36.9 C)  TempSrc: Oral Oral Oral Oral  SpO2: 99% 96% 100% 100%  Weight:   96.4 kg   Height:        Intake/Output Summary (Last 24 hours)  at 06/03/2021 0928 Last data filed at 06/03/2021 [id number] Gross per 24 hour  Intake 1021.9 ml  Output 550 ml  Net 471.9 ml   Filed Weights   06/01/21 1750 06/02/21 0417 06/03/21 0450  Weight: 96 kg 96.4 kg 96.4 kg     Data Reviewed:   CBC: Recent Labs  Lab 05/30/21 1911 05/31/21 0802 06/01/21 0640 06/02/21 0218 06/03/21 0054  WBC 7.4 8.0 7.5 9.3 6.8  NEUTROABS 5.3  --   --   --   --   HGB 10.4* 9.6* 10.1* 9.4* 8.8*  HCT 33.0* 31.9* 33.2* 29.5* 27.8*  MCV 86.4 86.7 85.3 83.8 84.2  PLT 227 234 234 223 [id number]   Basic Metabolic Panel: Recent Labs  Lab 05/27/21 1100 05/30/21 1911 05/31/21 0802 06/01/21 1006 06/02/21 0218  NA 138 139 142 137 136  K 3.3* 3.6 3.3* 3.6 3.5  CL 106 108 109 105 104  CO2 '23 22 24 24 24  '$ GLUCOSE 168* 106* 128* 179* 145*  BUN <5* 9 7* 5* 6*  CREATININE 0.69 0.62 0.58 0.61 0.58  CALCIUM 8.9 9.0 8.6* 8.6* 8.5*   GFR: Estimated Creatinine Clearance: 73.8 mL/min (by C-G formula based on SCr of 0.58 mg/dL). Liver Function Tests: No results for input(s): AST, ALT, ALKPHOS, BILITOT, PROT, ALBUMIN in the last 168 hours. No results for input(s): LIPASE, AMYLASE in the last 168 hours. No results for input(s): AMMONIA in the last 168 hours. Coagulation Profile: Recent Labs  Lab 05/30/21 2347 05/31/21 0802  INR 1.2 1.2   Cardiac Enzymes: No results for input(s): CKTOTAL, CKMB, CKMBINDEX, TROPONINI in the last 168 hours. BNP (last 3 results) No results for input(s): PROBNP in the last 8760 hours. HbA1C: No results for input(s): HGBA1C in the last 72 hours. CBG: Recent Labs  Lab 06/02/21 0619 06/02/21 1108 06/02/21 1708 06/02/21 2206 06/03/21 0624  GLUCAP 143* 165* 78 97 112*   Lipid Profile: No results for input(s): CHOL, HDL, LDLCALC, TRIG, CHOLHDL, LDLDIRECT in the last 72 hours. Thyroid Function Tests: No results for input(s): TSH, T4TOTAL, FREET4, T3FREE, THYROIDAB in the last 72 hours. Anemia Panel: No results for input(s): VITAMINB12,  FOLATE, FERRITIN, TIBC, IRON, RETICCTPCT in the last 72 hours. Sepsis Labs: No results for input(s): PROCALCITON, LATICACIDVEN in the last 168 hours.  Recent Results (from the past 240 hour(s))  Resp Panel by RT-PCR (Flu A&B, Covid) Nasopharyngeal Swab     Status: None   Collection Time: 05/31/21 12:12 AM   Specimen: Nasopharyngeal Swab; Nasopharyngeal(NP) swabs in vial transport medium  Result Value Ref Range Status   SARS Coronavirus 2 by RT PCR NEGATIVE NEGATIVE Final    Comment: (NOTE) SARS-CoV-2 target nucleic acids are NOT DETECTED.  The SARS-CoV-2 RNA is generally detectable in upper respiratory specimens during the acute phase of infection. The lowest concentration of SARS-CoV-2 viral copies this assay can detect is 138 copies/mL. A negative result does not preclude SARS-Cov-2 infection and should not be used as the sole basis for treatment or other patient management decisions. A negative result may occur with  improper specimen collection/handling, submission of specimen other than nasopharyngeal swab, presence of viral mutation(s) within the areas targeted by this assay, and inadequate number of viral  copies(<138 copies/mL). A negative result must be combined with clinical observations, patient history, and epidemiological information. The expected result is Negative.  Fact Sheet for Patients:  EntrepreneurPulse.com.au  Fact Sheet for Healthcare Providers:  IncredibleEmployment.be  This test is no t yet approved or cleared by the [place] FDA and  has been authorized for detection and/or diagnosis of SARS-CoV-2 by FDA under an Emergency Use Authorization (EUA). This EUA will remain  in effect (meaning this test can be used) for the duration of the COVID-19 declaration under Section 564(b)(1) of the Act, 21 U.S.C.section 360bbb-3(b)(1), unless the authorization is terminated  or revoked sooner.       Influenza A by PCR  NEGATIVE NEGATIVE Final   Influenza B by PCR NEGATIVE NEGATIVE Final    Comment: (NOTE) The Xpert Xpress SARS-CoV-2/FLU/RSV plus assay is intended as an aid in the diagnosis of influenza from Nasopharyngeal swab specimens and should not be used as a sole basis for treatment. Nasal washings and aspirates are unacceptable for Xpert Xpress SARS-CoV-2/FLU/RSV testing.  Fact Sheet for Patients: EntrepreneurPulse.com.au  Fact Sheet for Healthcare Providers: IncredibleEmployment.be  This test is not yet approved or cleared by the [place] FDA and has been authorized for detection and/or diagnosis of SARS-CoV-2 by FDA under an Emergency Use Authorization (EUA). This EUA will remain in effect (meaning this test can be used) for the duration of the COVID-19 declaration under Section 564(b)(1) of the Act, 21 U.S.C. section 360bbb-3(b)(1), unless the authorization is terminated or revoked.  Performed at [facility], [place] [address] [place], [place] [address]          Radiology Studies: PERIPHERAL VASCULAR CATHETERIZATION  Result Date: 06/01/2021 Images from the original result were not included. [name] MRN: [id number] DOB: [birth year] Sex: female 06/01/2021 Pre-operative Diagnosis: Recurrent left lower extremity DVT Post-operative diagnosis:  Same Surgeon:  Erlene Quan C. Donzetta Matters, MD Procedure Performed: 1.  Ultrasound-guided cannulation left popliteal vein 2.  Left lower extremity venogram and central venogram 3.  Intravascular ultrasound of left femoral vein, left common femoral vein, left external and common iliac veins and IVC 4.  Balloon angioplasty of left common iliac vein with 16 mm balloon 5.  Balloon angioplasty of left external and common femoral vein with 12 mm balloon 6.  Mechanical thrombectomy with Inari Clottriever of left common iliac vein, left external iliac vein, left common femoral and femoral vein 7.  Moderate  sedation with fentanyl and Versed for 59 minutes Indications: 75 year old female recently had extensive left lower extremity DVT and underwent stenting of her left common and external iliac veins for May Thurner syndrome.  She had previously been on Eliquis but was off for several days.  She has a CT scan from approximately 10 days ago for acute abdominal pain and then presented with several days of acute left lower extremity swelling.  By duplex she was found to have occluded stent and veins in the left lower extremity and was indicated for mechanical thrombectomy versus lysis. Findings: There is acute thrombus extending from the left popliteal vein through the femoral vein and the common femoral vein where there did appear to be some chronic thrombus as well.  The left external iliac vein and common iliac vein stent appeared to have acute thrombus in the external leg vein there is approximately 70% filled with thrombus in the common iliac vein.  We first balloon dilated the distal end of the stent into the IVC and perform mechanical thrombectomy  with a total of 4 passes through the stent and an additional pass through the common femoral vein on the left only.  We also balloon dilated the external leg vein and common femoral vein with a 12 mm balloon at completion.  There was flow channel from the popliteal vein through the femoral vein there was still some chronic appearing thrombus in the common femoral vein and the external iliac and common iliac vein stent was patent.  There was a flow channel without any reflux or collateralization by completion venogram.  Procedure:  The patient was identified in the holding area and taken to room 8.  The patient was then placed prone on the table and prepped and draped in the usual sterile fashion.  A time out was called.  Ultrasound was used to evaluate the left popliteal vein.  This was somewhat difficult to see but we anesthetized the area and cannulated with  micropuncture needle followed by wire and sheath.  An image saved from record.  We then placed a Glidewire advantage and an 8 [place] sheath and the patient was given additional 5000 units of heparin she had recently been on heparin drip up to that point.  We then used a Berenstein catheter we placed the IVC perform venogram there confirmed intraluminal access.  We then placed the wire into the IJ and performed intravascular ultrasound from the popliteal vein including the femoral vein, common femoral vein, external and common iliac veins and the stent in the IVC.  There we found thrombosed veins from the popliteal all the way up to the external leg vein there did appear to be some channel in the common iliac vein stent but was also partially thrombosed.  There appeared to be some chronic appearing thrombus in the common femoral vein on the left.  A 16 mm balloon was used to dilate the stent particularly at the most distal area going into the IVC at nominal pressure.  We then placed an Inari sheath under fluoroscopic guidance and then plan for mechanical thrombectomy.  We performed mechanical thrombectomy through the stent we did watch with magnified views.  We had a total of 4 passes through the stent and additional 1 pass in the common femoral vein with no additional clot returned.  The first 3 passes did have some clot the fourth pass with minimal residual clot in the first pass again with no clot returned.  We then again completed intravascular ultrasound from the popliteal vein including the femoral vein, common femoral vein, external leg vein stent and common iliac vein stent.  We then balloon dilated the external iliac vein and the common femoral vein with a 12 mm balloon.  Completion demonstrated approximately 50% chronic stenosis of the common femoral vein the other areas were patent and there was a flow channel throughout and the stent was patent without any residual stenosis or thrombus.  We then performed  completion venography which demonstrated brisk flow without any collateralization or reflux.  Satisfied with this we again anesthetize the skin placed a 3-0 nylon suture and a bolster.  The sheath was removed.  She tolerated procedure without any complication. Contrast: 30 cc Brandon C. Donzetta Matters, MD [facility] Office: [phone] Pager: [phone]        Scheduled Meds:  apixaban  10 mg Oral BID   Followed by   Derrill Memo ON 06/09/2021] apixaban  5 mg Oral BID   aspirin EC  81 mg Oral Daily   docusate  sodium  100 mg Oral BID   DULoxetine  20 mg Oral q morning   folic acid  1 mg Oral Daily   insulin aspart  0-5 Units Subcutaneous QHS   insulin aspart  0-9 Units Subcutaneous TID WC   losartan  50 mg Oral Daily   polyethylene glycol  17 g Oral BID   rosuvastatin  10 mg Oral Daily   sodium chloride flush  3 mL Intravenous Q12H   tiZANidine  4 mg Oral q morning   Continuous Infusions:  sodium chloride     dextrose 5 % and 0.45 % NaCl with KCl 20 mEq/L 50 mL/hr at 06/02/21 2021     LOS: 3 days   Time spent= 35 mins    Desiree Fleming Arsenio Loader, MD [facility]  If 7PM-7AM, please contact night-coverage  06/03/2021, 9:28 AM

## 2021-06-03 NOTE — Progress Notes (Signed)
RN Joi aware

## 2021-06-03 NOTE — Evaluation (Signed)
Physical Therapy Evaluation Patient Details Name: Diane [name] MRN: [id number] DOB: 05-May-1946 Today's Date: 06/03/2021   History of Present Illness  75 yo female with onset of L LE swelling and pain, found to have L LE DVT. S/P stent.   PMHx:  DVT, SBO, RA, DM, GERD, HTN, cholecystectomy, hernia repair   Clinical Impression  Patient received in recliner, just finished breakfast. Agrees to PT assessment. She requires min assist for sit to stand with cues for safe hand placement. Ambulated 100 feet with RW and min guard/supervision. No unsteadiness noted. Limited by fatigue. Patient will continue to benefit from skilled PT while here to improve mobility and strength for safe return home.       Follow Up Recommendations Home health PT    Equipment Recommendations  None recommended by PT    Recommendations for Other Services       Precautions / Restrictions Precautions Precaution Comments: mod fall Restrictions Weight Bearing Restrictions: No      Mobility  Bed Mobility               General bed mobility comments: patient received up in recliner and remained in recliner at end of session    Transfers Overall transfer level: Needs assistance Equipment used: Rolling walker (2 wheeled) Transfers: Sit to/from Stand Sit to Stand: Min guard         General transfer comment: cues for hand placement, trying to pull up on walker  Ambulation/Gait Ambulation/Gait assistance: Supervision Gait Distance (Feet): 100 Feet   Gait Pattern/deviations: Step-through pattern;Decreased step length - right;Decreased step length - left;Decreased stride length Gait velocity: reduced   General Gait Details: generally safe with ambulation  Stairs            Wheelchair Mobility    Modified Rankin (Stroke Patients Only)       Balance Overall balance assessment: Modified Independent Sitting-balance support: Feet supported Sitting balance-Leahy Scale: Normal     Standing  balance support: Bilateral upper extremity supported;During functional activity Standing balance-Leahy Scale: Fair Standing balance comment: helps to use RW at this time for support.                             Pertinent Vitals/Pain Pain Assessment: No/denies pain    Home Living Family/patient expects to be discharged to:: Private residence Living Arrangements: Alone Available Help at Discharge: Family;Available PRN/intermittently Type of Home: House Home Access: Stairs to enter   Entrance Stairs-Number of Steps: 2 Home Layout: Two level;Able to live on main level with bedroom/bathroom Home Equipment: Gilford Rile - 2 wheels;Shower seat Additional Comments: family brings food, has cleaning service and can drive at times    Prior Function Level of Independence: Needs assistance   Gait / Transfers Assistance Needed: Independent with ambulation  ADL's / Homemaking Assistance Needed: Reports daughter assists with bathing and dressing as needed.  Comments: Pt reports she has assistance for showers and dressing, an aide comes once a week for house cleaning, her daughters assist with IADLs, she does not use a walking device in the home, uses a walker for the yard for community mobility     Hand Dominance   Dominant Hand: Right    Extremity/Trunk Assessment   Upper Extremity Assessment Upper Extremity Assessment: Overall WFL for tasks assessed    Lower Extremity Assessment Lower Extremity Assessment: Overall WFL for tasks assessed    Cervical / Trunk Assessment Cervical / Trunk Assessment: Normal  Communication   Communication: No difficulties  Cognition Arousal/Alertness: Awake/alert Behavior During Therapy: WFL for tasks assessed/performed;Flat affect Overall Cognitive Status: Within Functional Limits for tasks assessed                                        General Comments      Exercises     Assessment/Plan    PT Assessment Patient needs  continued PT services  PT Problem List Decreased strength;Decreased activity tolerance;Decreased mobility       PT Treatment Interventions DME instruction;Gait training;Stair training;Functional mobility training;Therapeutic activities;Therapeutic exercise;Patient/family education    PT Goals (Current goals can be found in the Care Plan section)  Acute Rehab PT Goals Patient Stated Goal: to return home PT Goal Formulation: With patient Time For Goal Achievement: 06/10/21 Potential to Achieve Goals: Good    Frequency Min 3X/week   Barriers to discharge Inaccessible home environment;Decreased caregiver support      Co-evaluation               AM-PAC PT "6 Clicks" Mobility  Outcome Measure Help needed turning from your back to your side while in a flat bed without using bedrails?: A Little Help needed moving from lying on your back to sitting on the side of a flat bed without using bedrails?: A Little Help needed moving to and from a bed to a chair (including a wheelchair)?: A Little Help needed standing up from a chair using your arms (e.g., wheelchair or bedside chair)?: A Little Help needed to walk in hospital room?: A Little Help needed climbing 3-5 steps with a railing? : A Little 6 Click Score: 18    End of Session Equipment Utilized During Treatment: Gait belt Activity Tolerance: Patient tolerated treatment well;Patient limited by fatigue Patient left: in chair;with call bell/phone within reach Nurse Communication: Mobility status PT Visit Diagnosis: Muscle weakness (generalized) (M62.81);Difficulty in walking, not elsewhere classified (R26.2)    Time: [id number] PT Time Calculation (min) (ACUTE ONLY): 23 min   Charges:   PT Evaluation $PT Eval Moderate Complexity: 1 Mod PT Treatments $Gait Training: 8-22 mins        [employer], PT, [employer] 06/03/21,11:24 AM

## 2021-06-03 NOTE — Progress Notes (Signed)
Mobility Specialist: Progress Note   06/03/21 1450  Mobility  Activity Ambulated in hall  Level of Assistance Minimal assist, patient does 75% or more  Distance Ambulated (ft) 190 ft  Mobility Ambulated with assistance in hallway  Mobility Response Tolerated well  Mobility performed by Mobility specialist  Bed Position Chair  $Mobility charge 1 Mobility   Pre-Mobility: 84 HR Post-Mobility: 89 HR, 165/72 BP, 96% SpO2  Pt required minA to stand from the chair and was contact guard during ambulation, asx throughout. Pt back to the chair after walk with call bell and phone at her side.   [facility] Emira Eubanks Mobility Specialist Mobility Specialist Phone: [phone]

## 2021-06-03 NOTE — Progress Notes (Signed)
    Progress Note    06/03/2021 4:41 PM 2 Days Post-Op  Subjective: No overnight issues   Vitals:   06/03/21 0811 06/03/21 1205  BP: (!) 159/81 126/66  Pulse: 81 74  Resp: 18 16  Temp: 98.5 F (36.9 C) 98.3 F (36.8 C)  SpO2: 100% 100%    Physical Exam: Awake alert oriented On the respirations Abdomen is soft Left lower extremity with improved edema Popliteal fossa soft without hematoma  CBC    Component Value Date/Time   WBC 6.8 06/03/2021 0054   RBC 3.30 (L) 06/03/2021 0054   HGB 8.8 (L) 06/03/2021 0054   HCT 27.8 (L) 06/03/2021 0054   PLT 234 06/03/2021 0054   MCV 84.2 06/03/2021 0054   MCH 26.7 06/03/2021 0054   MCHC 31.7 06/03/2021 0054   RDW 16.0 (H) 06/03/2021 0054   LYMPHSABS 1.3 05/30/2021 1911   MONOABS 0.7 05/30/2021 1911   EOSABS 0.1 05/30/2021 1911   BASOSABS 0.0 05/30/2021 1911    BMET    Component Value Date/Time   NA 140 06/03/2021 0932   NA 145 06/19/2018 0000   K 3.4 (L) 06/03/2021 0932   CL 110 06/03/2021 0932   CO2 23 06/03/2021 0932   GLUCOSE 113 (H) 06/03/2021 0932   BUN 5 (L) 06/03/2021 0932   BUN 7 06/19/2018 0000   CREATININE 0.63 06/03/2021 0932   CALCIUM 8.3 (L) 06/03/2021 0932   GFRNONAA >60 06/03/2021 0932   GFRAA >60 12/09/2019 2243    INR    Component Value Date/Time   INR 1.2 05/31/2021 0802     Intake/Output Summary (Last 24 hours) at 06/03/2021 1641 Last data filed at 06/03/2021 0948 Gross per 24 hour  Intake 580 ml  Output 1550 ml  Net -970 ml     Assessment:  75 y.o. female is s/p: Left lower extremity mechanical thrombectomy for recurrent DVT   She is progressing well with improved swelling.  We again discussed need for compression stockings as well as need to take Eliquis and aspirin.  Given that she has rethrombosed her stent in about 3-1/2 months after placement, we will plan for at least another 6 months to 1 year of Eliquis with concomitant aspirin and aspirin will be indefinite afterwards.  She  will follow-up in our office with IVC iliac duplex in 4 to 6 weeks.  Okay for discharge per vascular.   Brandonlee Navis C. Donzetta Matters, MD [facility] [place] Office: [phone] Pager: [phone]  06/03/2021 4:41 PM

## 2021-06-04 DIAGNOSIS — R9431 Abnormal electrocardiogram [ECG] [EKG]: Secondary | ICD-10-CM

## 2021-06-04 LAB — BASIC METABOLIC PANEL
Anion gap: 9 (ref 5–15)
BUN: 6 mg/dL — ABNORMAL LOW (ref 8–23)
CO2: 23 mmol/L (ref 22–32)
Calcium: 9 mg/dL (ref 8.9–10.3)
Chloride: 105 mmol/L (ref 98–111)
Creatinine, Ser: 0.56 mg/dL (ref 0.44–1.00)
GFR, Estimated: >60 mL/min (ref >60)
Glucose, Bld: 124 mg/dL — ABNORMAL HIGH (ref 70–99)
Potassium: 3.7 mmol/L (ref 3.5–5.1)
Sodium: 137 mmol/L (ref 135–145)

## 2021-06-04 LAB — GLUCOSE, CAPILLARY
Glucose-Capillary: 129 mg/dL — ABNORMAL HIGH (ref 70–99)
Glucose-Capillary: 160 mg/dL — ABNORMAL HIGH (ref 70–99)
Glucose-Capillary: 89 mg/dL (ref 70–99)
Glucose-Capillary: 99 mg/dL (ref 70–99)

## 2021-06-04 LAB — CBC
HCT: 29.8 % — ABNORMAL LOW (ref 36.0–46.0)
Hemoglobin: 9.3 g/dL — ABNORMAL LOW (ref 12.0–15.0)
MCH: 26.1 pg (ref 26.0–34.0)
MCHC: 31.2 g/dL (ref 30.0–36.0)
MCV: 83.7 fL (ref 80.0–100.0)
Platelets: 292 10*3/uL (ref 150–400)
RBC: 3.56 MIL/uL — ABNORMAL LOW (ref 3.87–5.11)
RDW: 16 % — ABNORMAL HIGH (ref 11.5–15.5)
WBC: 6.5 10*3/uL (ref 4.0–10.5)
nRBC: 0 % (ref 0.0–0.2)

## 2021-06-04 LAB — MAGNESIUM: Magnesium: 1.6 mg/dL — ABNORMAL LOW (ref 1.7–2.4)

## 2021-06-04 MED ORDER — POTASSIUM CHLORIDE CRYS ER 20 MEQ PO TBCR
20.0000 meq | EXTENDED_RELEASE_TABLET | Freq: Once | ORAL | Status: AC
  Administered 2021-06-04: 20 meq via ORAL
  Filled 2021-06-04: qty 1

## 2021-06-04 MED ORDER — MAGNESIUM SULFATE 2 GM/50ML IV SOLN
2.0000 g | Freq: Once | INTRAVENOUS | Status: AC
  Administered 2021-06-04: 2 g via INTRAVENOUS
  Filled 2021-06-04: qty 50

## 2021-06-04 MED ORDER — BISACODYL 10 MG RE SUPP: 10.0000 mg | Freq: Once | RECTAL | Status: DC

## 2021-06-04 MED ORDER — SENNOSIDES-DOCUSATE SODIUM 8.6-50 MG PO TABS
3.0000 | ORAL_TABLET | Freq: Two times a day (BID) | ORAL | Status: DC
  Administered 2021-06-05: 3 via ORAL
  Filled 2021-06-04 (×2): qty 3

## 2021-06-04 MED FILL — Heparin Sod (Porcine)-NaCl IV Soln 1000 Unit/500ML-0.9%: INTRAVENOUS | Qty: 500 | Status: AC

## 2021-06-04 NOTE — Progress Notes (Signed)
Mobility Specialist: Progress Note   06/04/21 1218  Mobility  Activity Ambulated in hall  Level of Assistance Minimal assist, patient does 75% or more  Assistive Device Front wheel walker  Distance Ambulated (ft) 180 ft  Mobility Ambulated with assistance in hallway  Mobility Response Tolerated well  Mobility performed by Mobility specialist  Bed Position Chair  $Mobility charge 1 Mobility   Pre-Mobility: 79 HR, 115/64 BP, 100% SpO2 Post-Mobility: 85 HR, 146/75 BP, 100% SpO2  Pt required minA to stand from chair and was standby assist during ambulation, asx throughout. Pt back to recliner after walk per request. Pt requesting to get washed up, NT notified.   [facility] Wyndham Santilli Mobility Specialist Mobility Specialist Phone: [phone]

## 2021-06-04 NOTE — Care Management Important Message (Signed)
Important Message  Patient Details  Name: Diane [name] MRN: [id number] Date of Birth: [birth year]   Medicare Important Message Given:  Yes     Shelda Altes 06/04/2021, 10:30 AM

## 2021-06-04 NOTE — Progress Notes (Signed)
PROGRESS NOTE    AVIELLE RYDMAN  [id number] DOB: December 09, 1945 DOA: 05/30/2021 PCP: [name], MD   Brief Narrative:  75 y.o. female past medical history significant for essential hypertension diabetes mellitus type 2 history of DVT recent small bowel obstruction comes in for evaluation of left lower extremity pain and swelling Doppler showed acute DVT and EMG of the chest showed no PE borderline cardiomegaly coronary calcification.  Patient is quite constipated in the hospital requiring aggressive bowel regimen.  Seen by vascular surgery, cleared for discharge with outpatient follow-up.   Assessment & Plan:   Principal Problem:   Left leg DVT (HCC) Active Problems:   Hypertension   Prolonged Q-T interval on ECG   Type 2 diabetes mellitus with diabetic neuropathy, unspecified (HCC)   Acute DVT (deep venous thrombosis) (HCC)   Left leg DVT [facility]): Peripheral vascular disease -CTA chest negative for PE.  Seen by vascular - Status post surgical intervention 8/1-mechanical thrombectomy with balloon angioplasty - Eliquis twice daily.  Follow-up outpatient vascular 6-8 weeks with IVC iliac duplex to reevaluate stent  Diminished bowel sounds/constipation - Does have history of small bowel obstructions.  History of cholecystectomy, hysterectomy, hernia repair, previous bowel repair.  KUB shows gas and stool burden.  Bowel regimen ordered along with enema.   Essential hypertension: -Continue losartan.  IV hydralazine as needed   Prolonged Q-T interval on ECG -Monitor electrolytes   Type 2 diabetes mellitus with diabetic neuropathy, unspecified (HCC) -Currently metformin on hold.  Insulin sliding scale and Accu-Cheks - Blood glucose stable off D5  DVT prophylaxis: Eliquis Code Status: Full Family Communication:  Daughter - Lauro Regulus updated.   Status is: Inpatient  Remains inpatient appropriate because:Inpatient level of care appropriate due to severity of illness  Dispo:  The patient is from: Home              Anticipated d/c is to: Home              Patient currently is not medically stable to d/c.  Having abdominal discomfort, undergoing evaluation.  Also will need to maintain his blood glucose level off D5 fluids.   Difficult to place patient No       Subjective: Sitting in the chair. No bowel movement yet, she feels little uncomfrtable.   Review of Systems Otherwise negative except as per HPI, including: General = no fevers, chills, dizziness,  fatigue HEENT/EYES = negative for loss of vision, double vision, blurred vision,  sore throa Cardiovascular= negative for chest pain, palpitation Respiratory/lungs= negative for shortness of breath, cough, wheezing; hemoptysis,  Gastrointestinal= negative for nausea, vomiting, abdominal pain Genitourinary= negative for Dysuria MSK = Negative for arthralgia, myalgias Neurology= Negative for headache, numbness, tingling  Psychiatry= Negative for suicidal and homocidal ideation Skin= Negative for Rash   Examination:  Constitutional: Not in acute distress Respiratory: Clear to auscultation bilaterally Cardiovascular: Normal sinus rhythm, no rubs Abdomen: Nontender nondistended good bowel sounds Musculoskeletal: No edema noted Skin: No rashes seen Neurologic: CN 2-12 grossly intact.  And nonfocal Psychiatric: Normal judgment and insight. Alert and oriented x 3. Normal mood.    Objective: Vitals:   06/03/21 2317 06/04/21 0120 06/04/21 0431 06/04/21 0716  BP: (!) 184/109 (!) 162/71 (!) 163/83 (!) 143/61  Pulse: 80  90 90  Resp: '19  18 16  '$ Temp: 98.4 F (36.9 C)  98.6 F (37 C) 98.5 F (36.9 C)  TempSrc: Oral  Oral Oral  SpO2: 100%  100% 100%  Weight:  Height:        Intake/Output Summary (Last 24 hours) at 06/04/2021 1234 Last data filed at 06/03/2021 1700 Gross per 24 hour  Intake 480 ml  Output 450 ml  Net 30 ml   Filed Weights   06/01/21 1750 06/02/21 0417 06/03/21 0450  Weight:  96 kg 96.4 kg 96.4 kg     Data Reviewed:   CBC: Recent Labs  Lab 05/30/21 1911 05/31/21 0802 06/01/21 0640 06/02/21 0218 06/03/21 0054 06/04/21 0252  WBC 7.4 8.0 7.5 9.3 6.8 6.5  NEUTROABS 5.3  --   --   --   --   --   HGB 10.4* 9.6* 10.1* 9.4* 8.8* 9.3*  HCT 33.0* 31.9* 33.2* 29.5* 27.8* 29.8*  MCV 86.4 86.7 85.3 83.8 84.2 83.7  PLT 227 234 234 223 [id number]   Basic Metabolic Panel: Recent Labs  Lab 05/31/21 0802 06/01/21 1006 06/02/21 0218 06/03/21 0932 06/04/21 0252  NA 142 137 136 140 137  K 3.3* 3.6 3.5 3.4* 3.7  CL 109 105 104 110 105  CO2 '24 24 24 23 23  '$ GLUCOSE 128* 179* 145* 113* 124*  BUN 7* 5* 6* 5* 6*  CREATININE 0.58 0.61 0.58 0.63 0.56  CALCIUM 8.6* 8.6* 8.5* 8.3* 9.0  MG  --   --   --   --  1.6*   GFR: Estimated Creatinine Clearance: 73.8 mL/min (by C-G formula based on SCr of 0.56 mg/dL). Liver Function Tests: No results for input(s): AST, ALT, ALKPHOS, BILITOT, PROT, ALBUMIN in the last 168 hours. No results for input(s): LIPASE, AMYLASE in the last 168 hours. No results for input(s): AMMONIA in the last 168 hours. Coagulation Profile: Recent Labs  Lab 05/30/21 2347 05/31/21 0802  INR 1.2 1.2   Cardiac Enzymes: No results for input(s): CKTOTAL, CKMB, CKMBINDEX, TROPONINI in the last 168 hours. BNP (last 3 results) No results for input(s): PROBNP in the last 8760 hours. HbA1C: No results for input(s): HGBA1C in the last 72 hours. CBG: Recent Labs  Lab 06/03/21 0624 06/03/21 1215 06/03/21 1641 06/03/21 2027 06/04/21 0646  GLUCAP 112* 192* 94 120* 129*   Lipid Profile: No results for input(s): CHOL, HDL, LDLCALC, TRIG, CHOLHDL, LDLDIRECT in the last 72 hours. Thyroid Function Tests: No results for input(s): TSH, T4TOTAL, FREET4, T3FREE, THYROIDAB in the last 72 hours. Anemia Panel: No results for input(s): VITAMINB12, FOLATE, FERRITIN, TIBC, IRON, RETICCTPCT in the last 72 hours. Sepsis Labs: No results for input(s): PROCALCITON,  LATICACIDVEN in the last 168 hours.  Recent Results (from the past 240 hour(s))  Resp Panel by RT-PCR (Flu A&B, Covid) Nasopharyngeal Swab     Status: None   Collection Time: 05/31/21 12:12 AM   Specimen: Nasopharyngeal Swab; Nasopharyngeal(NP) swabs in vial transport medium  Result Value Ref Range Status   SARS Coronavirus 2 by RT PCR NEGATIVE NEGATIVE Final    Comment: (NOTE) SARS-CoV-2 target nucleic acids are NOT DETECTED.  The SARS-CoV-2 RNA is generally detectable in upper respiratory specimens during the acute phase of infection. The lowest concentration of SARS-CoV-2 viral copies this assay can detect is 138 copies/mL. A negative result does not preclude SARS-Cov-2 infection and should not be used as the sole basis for treatment or other patient management decisions. A negative result may occur with  improper specimen collection/handling, submission of specimen other than nasopharyngeal swab, presence of viral mutation(s) within the areas targeted by this assay, and inadequate number of viral copies(<138 copies/mL). A negative result must be combined  with clinical observations, patient history, and epidemiological information. The expected result is Negative.  Fact Sheet for Patients:  EntrepreneurPulse.com.au  Fact Sheet for Healthcare Providers:  IncredibleEmployment.be  This test is no t yet approved or cleared by the [place] FDA and  has been authorized for detection and/or diagnosis of SARS-CoV-2 by FDA under an Emergency Use Authorization (EUA). This EUA will remain  in effect (meaning this test can be used) for the duration of the COVID-19 declaration under Section 564(b)(1) of the Act, 21 U.S.C.section 360bbb-3(b)(1), unless the authorization is terminated  or revoked sooner.       Influenza A by PCR NEGATIVE NEGATIVE Final   Influenza B by PCR NEGATIVE NEGATIVE Final    Comment: (NOTE) The Xpert Xpress  SARS-CoV-2/FLU/RSV plus assay is intended as an aid in the diagnosis of influenza from Nasopharyngeal swab specimens and should not be used as a sole basis for treatment. Nasal washings and aspirates are unacceptable for Xpert Xpress SARS-CoV-2/FLU/RSV testing.  Fact Sheet for Patients: EntrepreneurPulse.com.au  Fact Sheet for Healthcare Providers: IncredibleEmployment.be  This test is not yet approved or cleared by the [place] FDA and has been authorized for detection and/or diagnosis of SARS-CoV-2 by FDA under an Emergency Use Authorization (EUA). This EUA will remain in effect (meaning this test can be used) for the duration of the COVID-19 declaration under Section 564(b)(1) of the Act, 21 U.S.C. section 360bbb-3(b)(1), unless the authorization is terminated or revoked.  Performed at [facility], [place] [address], [place], [place] [address]          Radiology Studies: DG Abd 1 View  Result Date: 06/03/2021 CLINICAL DATA:  Abdominal distension EXAM: ABDOMEN - 1 VIEW COMPARISON:  05/27/2021 and overlapping portion of chest CT from 05/31/2021 FINDINGS: Gas and stool are present in the colon. No dilated bowel. Left common iliac vein stent. Cholecystectomy clips noted. Probable vascular calcification to the left of the L4 vertebral body based on comparison prior CT scan 05/22/2021. IMPRESSION: 1. No dilated bowel or specific bowel gas abnormality observed. No appreciable abnormality to account for the reported abdominal distension. Electronically Signed   By: Van Clines M.D.   On: 06/03/2021 17:12        Scheduled Meds:  apixaban  10 mg Oral BID   Followed by   Derrill Memo ON 06/09/2021] apixaban  5 mg Oral BID   aspirin EC  81 mg Oral Daily   bisacodyl  10 mg Rectal Once   docusate sodium  100 mg Oral BID   DULoxetine  20 mg Oral q morning   folic acid  1 mg Oral Daily   insulin aspart  0-5 Units Subcutaneous QHS   insulin  aspart  0-9 Units Subcutaneous TID WC   losartan  50 mg Oral Daily   polyethylene glycol  17 g Oral BID   rosuvastatin  10 mg Oral Daily   senna-docusate  2 tablet Oral QHS   sodium chloride flush  3 mL Intravenous Q12H   tiZANidine  4 mg Oral q morning   Continuous Infusions:  sodium chloride       LOS: 4 days   Time spent= 35 mins    Jodeci Rini Arsenio Loader, MD [facility]  If 7PM-7AM, please contact night-coverage  06/04/2021, 12:34 PM

## 2021-06-04 NOTE — TOC Initial Note (Signed)
Transition of Care [facility]) - Initial/Assessment Note    Patient Details  Name: Diane [name] MRN: [id number] Date of Birth: 1946/05/18  Transition [facility]) CM/SW Contact:    Bethena Roys, RN Phone Number: 06/04/2021, 2:51 PM  Clinical Narrative:  Risk for readmission assessment completed. Patient presented from home and will return home with home health services via [facility]. Patient was previously active with the agency and wants to continue with services. [facility] is aware that the patient is hospitalized. Start of care to begin within 24-48 hours post transition home. No durable medical equipment needs  identified at this time. Patient states she has a primary care provider and has no issues with transportation to appointments. Patient states she may be discharged as early as 06-05-21. Please follow for resumption HH orders and F2F. Case Manager will continue to follow for additional transition of care needs.              Expected Discharge Plan: [place] Barriers to Discharge: No Barriers Identified   Patient Goals and CMS Choice Patient states their goals for this hospitalization and ongoing recovery are:: to return home   Choice offered to / list presented to : NA (Currently active with [facility]  Expected Discharge Plan and Services Expected Discharge Plan: [place] In-house Referral: NA Discharge Planning Services: CM Consult Post Acute Care Choice: Resumption of Svcs/PTA Provider, Home Health Living arrangements for the past 2 months: Single Family Home                 DME Arranged: N/A DME Agency: NA       HH Arranged: PT HH Agency: [facility] Date [facility] Agency Contacted: 06/03/21 Time HH Agency Contacted: 1600 Representative spoke with at Welcome: Tommi Rumps  Prior Living Arrangements/Services Living arrangements for the past 2 months: [place] with:: Self Patient language and need for  interpreter reviewed:: Yes Do you feel safe going back to the place where you live?: Yes      Need for Family Participation in Patient Care: Yes (Comment) Care giver support system in place?: Yes (comment) Current home services: Home PT Criminal Activity/Legal Involvement Pertinent to Current Situation/Hospitalization: No - Comment as needed  Activities of Daily Living Home Assistive Devices/Equipment: Environmental consultant (specify type), Cane (specify quad or straight), Dentures (specify type) ADL Screening (condition at time of admission) Patient's cognitive ability adequate to safely complete daily activities?: Yes Is the patient deaf or have difficulty hearing?: No Does the patient have difficulty seeing, even when wearing glasses/contacts?: No Does the patient have difficulty concentrating, remembering, or making decisions?: No Patient able to express need for assistance with ADLs?: Yes Does the patient have difficulty dressing or bathing?: Yes Independently performs ADLs?: No Communication: Independent Dressing (OT): Needs assistance Is this a change from baseline?: Change from baseline, expected to last <3days Grooming: Needs assistance Is this a change from baseline?: Change from baseline, expected to last <3 days Feeding: Independent Bathing: Needs assistance Is this a change from baseline?: Change from baseline, expected to last <3 days Toileting: Needs assistance Is this a change from baseline?: Change from baseline, expected to last <3 days In/Out Bed: Needs assistance Is this a change from baseline?: Change from baseline, expected to last <3 days Walks in Home: Needs assistance Is this a change from baseline?: Change from baseline, expected to last <3 days Does the patient have difficulty walking or climbing stairs?: Yes Weakness  of Legs: Both Weakness of Arms/Hands: Both  Permission Sought/Granted Permission sought to share information with : Case Manager, Family Supports,  Investment banker, corporate granted to share info w AGENCY: [facility]        Emotional Assessment Appearance:: Appears stated age Attitude/Demeanor/Rapport: Engaged Affect (typically observed): Appropriate Orientation: : Oriented to Self, Oriented to Place, Oriented to  Time, Oriented to Situation Alcohol / Substance Use: Not Applicable Psych Involvement: No (comment)  Admission diagnosis:  Left leg DVT (HCC) [I82.402] Acute DVT (deep venous thrombosis) (HCC) [I82.409] Acute deep vein thrombosis (DVT) of femoral vein of left lower extremity ([place]) [I82.412] Patient Active Problem List   Diagnosis Date Noted   Left leg DVT ([place]) 05/30/2021   Normochromic normocytic anemia 02/12/2021   Constipation    Fever    Acute DVT (deep venous thrombosis) ([place]) 02/04/2021   AKI (acute kidney injury) ([place]) 01/23/2021   Rheumatoid arthritis ([place]) 01/23/2021   SBO (small bowel obstruction) ([place]) 01/22/2021   Postoperative anemia due to acute blood loss 06/18/2018   Type 2 diabetes mellitus with diabetic neuropathy, unspecified ([place]) 06/18/2018   Peripheral neuropathy 06/18/2018   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

## 2021-06-05 ENCOUNTER — Other Ambulatory Visit (HOSPITAL_COMMUNITY): Payer: Self-pay

## 2021-06-05 LAB — CBC
HCT: 30.9 % — ABNORMAL LOW (ref 36.0–46.0)
Hemoglobin: 9.3 g/dL — ABNORMAL LOW (ref 12.0–15.0)
MCH: 25.5 pg — ABNORMAL LOW (ref 26.0–34.0)
MCHC: 30.1 g/dL (ref 30.0–36.0)
MCV: 84.9 fL (ref 80.0–100.0)
Platelets: 312 10*3/uL (ref 150–400)
RBC: 3.64 MIL/uL — ABNORMAL LOW (ref 3.87–5.11)
RDW: 16.1 % — ABNORMAL HIGH (ref 11.5–15.5)
WBC: 5.2 10*3/uL (ref 4.0–10.5)
nRBC: 0 % (ref 0.0–0.2)

## 2021-06-05 LAB — BASIC METABOLIC PANEL
Anion gap: 10 (ref 5–15)
BUN: 6 mg/dL — ABNORMAL LOW (ref 8–23)
CO2: 23 mmol/L (ref 22–32)
Calcium: 8.6 mg/dL — ABNORMAL LOW (ref 8.9–10.3)
Chloride: 107 mmol/L (ref 98–111)
Creatinine, Ser: 0.62 mg/dL (ref 0.44–1.00)
GFR, Estimated: >60 mL/min (ref >60)
Glucose, Bld: 208 mg/dL — ABNORMAL HIGH (ref 70–99)
Potassium: 3.9 mmol/L (ref 3.5–5.1)
Sodium: 140 mmol/L (ref 135–145)

## 2021-06-05 LAB — MAGNESIUM: Magnesium: 1.7 mg/dL (ref 1.7–2.4)

## 2021-06-05 LAB — GLUCOSE, CAPILLARY
Glucose-Capillary: 117 mg/dL — ABNORMAL HIGH (ref 70–99)
Glucose-Capillary: 213 mg/dL — ABNORMAL HIGH (ref 70–99)

## 2021-06-05 MED ORDER — APIXABAN 5 MG PO TABS
ORAL_TABLET | ORAL | 0 refills | Status: DC
  Filled 2021-06-05: qty 68, 30d supply, fill #0

## 2021-06-05 MED ORDER — ASPIRIN 81 MG PO TBEC
81.0000 mg | DELAYED_RELEASE_TABLET | Freq: Every day | ORAL | 0 refills | Status: AC
  Filled 2021-06-05: qty 30, 30d supply, fill #0

## 2021-06-05 NOTE — Progress Notes (Signed)
Discharge instructions provided to patient. All medications, follow up appointments, and discharge instructions discussed. IV out. Monitor off. Discharging to home.  Era Bumpers, RN

## 2021-06-05 NOTE — Consult Note (Signed)
   [facility] CM Inpatient Consult   06/05/2021  RANELL FINELLI [birth year] [id number]  [place] [facility] [ACO] Patient: [employer] Medicare  Primary Care Provider:  [name], MD, [facility]  Patient screened for readmission hospitalization with less than 7 days noted and noted high risk score for unplanned readmission risk.  Patient assessed for potential [place] [facility] service needs for post hospital transition.  Review of patient's medical record reveals patient is had issues with medication noted per MD History and Physical.  Patient for home with [facility] noted. Met with the patient at the bedside, explained [place] Management for post hospital follow up needs.  Patient states she doesn't answer her phone to un-identified calls and that she is interested but CM need to call her daughters to get her the message.  Plan: Will follow up and assign to [place] for post hospital follow up with permission to speak with her daughters as listed in the electronic medical record demographics for Lauro Regulus and [employer].  For questions contact:   Natividad Brood, RN BSN [facility] Liaison  [phone] business mobile phone Toll free office [phone]  Fax number: [phone] [email or web address]

## 2021-06-05 NOTE — Discharge Summary (Signed)
Physician Discharge Summary  Diane [name] [id number] DOB: [birth year] DOA: 05/30/2021  PCP: Diane Seashore, MD  Admit date: 05/30/2021 Discharge date: 06/05/2021  Admitted From: Home Disposition: Home  Recommendations for Outpatient Follow-up:  Follow up with PCP in 1 weeks Please obtain BMP/CBC in one week your next doctors visit.  Discontinue metformin due to hypoglycemia.  Resume when instructed to by PCP.  In the meantime advised to keep blood glucose logs. aspirin Eliquis has been prescribed for at least 6 months to 1 year Follow-up outpatient vascular surgery.  She will need IVC iliac duplex in 4 to 6 weeks Daily bowel regimen to at least have 1-2 soft bowel movements daily   Discharge Condition: Stable CODE STATUS: Full Diet recommendation: Heart healthy/diabetic  Brief/Interim Summary: 75 y.o. female past medical history significant for essential hypertension diabetes mellitus type 2 history of DVT recent small bowel obstruction comes in for evaluation of left lower extremity pain and swelling Doppler showed acute DVT and EMG of the chest showed no PE borderline cardiomegaly coronary calcification.  Patient is quite constipated in the hospital requiring aggressive bowel regimen.  Seen by vascular surgery, cleared for discharge with outpatient follow-up.  Recommended aspirin and Plavix for minimum of 6 months with outpatient IVC iliac duplex in about 4 to 6 weeks. Also suffered from constipation while in the hospital requiring tapwater enema with successful bowel movement and improvement in her symptoms. She was having symptomatic hypoglycemia during her hospitalization therefore her metformin was discontinued upon discharge with recommendations as stated above. Medically stable for discharge today.     Assessment & Plan:   Principal Problem:   Left leg DVT (HCC) Active Problems:   Hypertension   Prolonged Q-T interval on ECG   Type 2 diabetes mellitus with diabetic  neuropathy, unspecified (HCC)   Acute DVT (deep venous thrombosis) (HCC)     Left leg DVT [facility]): Peripheral vascular disease -CTA chest negative for PE.  Seen by vascular - Status post surgical intervention 8/1-mechanical thrombectomy with balloon angioplasty - Aspirin and Eliquis twice daily.  Follow-up outpatient vascular 6-8 weeks with IVC iliac duplex to reevaluate stent   Diminished bowel sounds/constipation, improved - Does have history of small bowel obstructions.  History of cholecystectomy, hysterectomy, hernia repair, previous bowel repair.  KUB shows gas and stool burden.  Improved with tapwater enema.  Would like 1 more tapwater enema prior to discharge.   Essential hypertension: - Resume home medication   Prolonged Q-T interval on ECG -Monitor electrolytes   Type 2 diabetes mellitus with diabetic neuropathy, unspecified ([name]) - Discontinue metformin upon discharge.  Advised to keep blood glucose logs at home, follow-up with PCP in 1 week and resume if appropriate at that time.    Body mass index is 32.31 kg/m.         Discharge Diagnoses:  Principal Problem:   Left leg DVT ([name]) Active Problems:   Hypertension   Prolonged Q-T interval on ECG   Type 2 diabetes mellitus with diabetic neuropathy, unspecified ([name])   Acute DVT (deep venous thrombosis) ([name])      Consultations: Vascular surgery  Subjective: Feels okay no complaints at this time.  Wishes to go home.  Had a large bowel movement yesterday and tells me her abdomen feels significantly better and back to her normal self.  Discharge Exam: Vitals:   06/05/21 0346 06/05/21 0923  BP: (!) 151/72 (!) 149/77  Pulse: 81 82  Resp: 18 19  Temp: 98.1 F (36.7 C)  98.6 F (37 C)  SpO2: 100% 100%   Vitals:   06/04/21 1958 06/04/21 2356 06/05/21 0346 06/05/21 0923  BP: (!) 156/100 (!) 158/76 (!) 151/72 (!) 149/77  Pulse: 85 100 81 82  Resp: '18  18 19  '$ Temp: 98.5 F (36.9 C) 98.6 F (37 C) 98.1  F (36.7 C) 98.6 F (37 C)  TempSrc: Oral Oral Oral Oral  SpO2: 100%  100% 100%  Weight:      Height:        General: Pt is alert, awake, not in acute distress Cardiovascular: RRR, S1/S2 +, no rubs, no gallops Respiratory: CTA bilaterally, no wheezing, no rhonchi Abdominal: Soft, NT, ND, bowel sounds + Extremities: no edema, no cyanosis  Discharge Instructions   Allergies as of 06/05/2021       Reactions   Cephalexin Other (See Comments)   URINARY RETENTION = "blocked my urine"   Codeine Hives, Rash   Nucynta [tapentadol Hcl] Other (See Comments)   Altered mental status        Medication List     TAKE these medications    apixaban 5 MG Tabs tablet Commonly known as: ELIQUIS Take 2 tablets (10 mg total) by mouth 2 (two) times daily for 4 days, THEN 1 tablet (5 mg total) 2 (two) times daily for 26 days. Start taking on: June 09, 2021 What changed:  See the new instructions. These instructions start on June 09, 2021. If you are unsure what to do until then, ask your doctor or other care provider.   aspirin 81 MG EC tablet Take 1 tablet (81 mg total) by mouth daily. Swallow whole. Start taking on: June 06, 2021   clotrimazole-betamethasone cream Commonly known as: LOTRISONE Apply 1 application topically 2 (two) times daily. Apply to feet   docusate sodium 100 MG capsule Commonly known as: COLACE Take 1 capsule (100 mg total) by mouth 2 (two) times daily.   DULoxetine 20 MG capsule Commonly known as: CYMBALTA Take 20 mg by mouth every morning.   folic acid 1 MG tablet Commonly known as: FOLVITE Take 1 tablet (1 mg total) by mouth daily.   losartan 50 MG tablet Commonly known as: COZAAR Take 1 tablet (50 mg total) by mouth daily.   metFORMIN 500 MG 24 hr tablet Commonly known as: GLUCOPHAGE-XR Take 1 tablet by mouth 2 (two) times daily with a meal.   methotrexate 2.5 MG tablet Commonly known as: RHEUMATREX Take 8 tablets (20 mg total) by mouth once  a week. 8 tablets weekly (Sunday)   polyethylene glycol 17 g packet Commonly known as: MIRALAX / GLYCOLAX Take 17 g by mouth 2 (two) times daily.   rosuvastatin 10 MG tablet Commonly known as: CRESTOR Take 1 tablet (10 mg total) by mouth daily.   simethicone 80 MG chewable tablet Commonly known as: MYLICON Chew 1 tablet (80 mg total) by mouth 4 (four) times daily.   tiZANidine 4 MG tablet Commonly known as: ZANAFLEX Take 4 mg by mouth every morning.        Follow-up Information     Waynetta Sandy, MD Follow up.   Specialties: Vascular Surgery, Cardiology Contact information: [place] [address] [phone]         [facility] Follow up.   Specialty: Home Health Services Why: Physical Therapy-office to call with visit times. Contact information: [place] [address] [phone]         Ashby Dawes,  Ajith, MD Follow up in 1 week(s).   Specialty: Internal Medicine Contact information: [address] [place] [address] [phone]                Allergies  Allergen Reactions   Cephalexin Other (See Comments)    URINARY RETENTION = "blocked my urine"   Codeine Hives and Rash   Nucynta [Tapentadol Hcl] Other (See Comments)    Altered mental status    You were cared for by a hospitalist during your hospital stay. If you have any questions about your discharge medications or the care you received while you were in the hospital after you are discharged, you can call the unit and asked to speak with the hospitalist on call if the hospitalist that took care of you is not available. Once you are discharged, your primary care physician will handle any further medical issues. Please note that no refills for any discharge medications will be authorized once you are discharged, as it is imperative that you return to your primary care physician (or establish a relationship with  a primary care physician if you do not have one) for your aftercare needs so that they can reassess your need for medications and monitor your lab values.   Procedures/Studies: DG Chest 1 View  Result Date: 05/30/2021 CLINICAL DATA:  Chest pain and shortness of breath. EXAM: CHEST  1 VIEW COMPARISON:  Chest radiograph 02/06/2021. FINDINGS: Stable cardiac and mediastinal contours. No consolidative pulmonary opacities. No pleural effusion or pneumothorax. IMPRESSION: No active disease. Electronically Signed   By: Lovey Newcomer M.D.   On: 05/30/2021 18:01   DG Abd 1 View  Result Date: 06/03/2021 CLINICAL DATA:  Abdominal distension EXAM: ABDOMEN - 1 VIEW COMPARISON:  05/27/2021 and overlapping portion of chest CT from 05/31/2021 FINDINGS: Gas and stool are present in the colon. No dilated bowel. Left common iliac vein stent. Cholecystectomy clips noted. Probable vascular calcification to the left of the L4 vertebral body based on comparison prior CT scan 05/22/2021. IMPRESSION: 1. No dilated bowel or specific bowel gas abnormality observed. No appreciable abnormality to account for the reported abdominal distension. Electronically Signed   By: Van Clines M.D.   On: 06/03/2021 17:12   CT Angio Chest PE W/Cm &/Or Wo Cm  Result Date: 05/31/2021 CLINICAL DATA:  PE suspected, high prob Chest pain and shortness of breath. EXAM: CT ANGIOGRAPHY CHEST WITH CONTRAST TECHNIQUE: Multidetector CT imaging of the chest was performed using the standard protocol during bolus administration of intravenous contrast. Multiplanar CT image reconstructions and MIPs were obtained to evaluate the vascular anatomy. CONTRAST:  53m OMNIPAQUE IOHEXOL 350 MG/ML SOLN COMPARISON:  Radiograph earlier today.  Chest CT 11/21/2018 FINDINGS: Cardiovascular: There are no filling defects within the pulmonary arteries to suggest pulmonary embolus. Evaluation is diagnostic to the distal segmental level. The subsegmental branches are not  well assessed due to contrast bolus timing. Borderline cardiomegaly. There are coronary artery calcifications. Atherosclerosis of the thoracic aorta without dissection or acute aortic findings. Conventional branching pattern from the aortic arch. No pericardial effusion. Mediastinum/Nodes: No enlarged mediastinal or hilar lymph nodes. Small hiatal hernia. No suspicious thyroid nodule. No axillary adenopathy Lungs/Pleura: Compressive atelectasis in the medial right lower lobe adjacent to thoracic osteophytes. Minor bandlike scarring in the dependent left lower lobe. No acute airspace disease. No pleural fluid. No findings of pulmonary edema. Trachea and central bronchi are patent. No pulmonary mass. Upper Abdomen: Cholecystectomy. No acute upper abdominal findings. Ingested material  within the stomach. Musculoskeletal: Reverse right shoulder arthroplasty. Thoracic spondylosis with endplate spurring. There are no acute or suspicious osseous abnormalities. Review of the MIP images confirms the above findings. IMPRESSION: 1. No pulmonary embolus or acute intrathoracic abnormality. 2. Borderline cardiomegaly.  Coronary artery calcifications. 3. Small hiatal hernia. Aortic Atherosclerosis (ICD10-I70.0). Electronically Signed   By: Keith Rake M.D.   On: 05/31/2021 00:17   CT Abdomen Pelvis W Contrast  Result Date: 05/22/2021 CLINICAL DATA:  Abdominal pain, acute, nonlocalized EXAM: CT ABDOMEN AND PELVIS WITH CONTRAST TECHNIQUE: Multidetector CT imaging of the abdomen and pelvis was performed using the standard protocol following bolus administration of intravenous contrast. CONTRAST:  172m OMNIPAQUE IOHEXOL 300 MG/ML  SOLN COMPARISON:  CT abdomen pelvis 01/22/2021 FINDINGS: Lower chest: Linear atelectasis versus scarring within the right lower lobe. Similar finding within the left lower lobe. Also right middle lobe. Coronary artery calcifications. Possible tiny hiatal hernia. Hepatobiliary: The hepatic  parenchyma is diffusely hypodense compared to the splenic parenchyma consistent with fatty infiltration. No focal liver abnormality. Status post cholecystectomy. No biliary dilatation. Pancreas: No focal lesion. Normal pancreatic contour. No surrounding inflammatory changes. No main pancreatic ductal dilatation. Spleen: Normal in size without focal abnormality. Adrenals/Urinary Tract: No adrenal nodule bilaterally. No nephrolithiasis, no hydronephrosis, and no contour-deforming renal mass. No ureterolithiasis or hydroureter. The urinary bladder is unremarkable. On delayed imaging, there is no urothelial wall thickening and there are no filling defects in the opacified portions of the bilateral collecting systems or ureters. Stomach/Bowel: Stomach is within normal limits. Small bowel distended with fluid measuring up to 3.4 cm. Fluid distended small bowel extends to the terminal ileum. Hazy contour of the terminal ileum/ileocecal valve region with narrowing of the small bowel. Fluid density within the lumen of the cecum. No definite pneumatosis. Similar-appearing thickened base of the appendix with distal appendix appear normal (6:41). Vascular/Lymphatic: Limited evaluation of the left common and external iliac venous stent. No abdominal aorta or iliac aneurysm. Mild atherosclerotic plaque of the aorta and its branches. No abdominal, pelvic, or inguinal lymphadenopathy. Reproductive: Status post hysterectomy. No adnexal masses. Other: Interval increase of trace volume ascites. No intraperitoneal free gas. No organized fluid collection. Musculoskeletal: No abdominal wall hernia or abnormality. No suspicious lytic or blastic osseous lesions. No acute displaced fracture. IMPRESSION: 1. Similar-appearing partial small bowel obstruction with fluid distended small bowel loop extending to the ileocecal valve. Question ileocecal/base of the appendix mass. Recommend colonoscopy for further evaluation. 2. Interval increase in  trace volumes ascites. 3. Hepatic steatosis. 4. Possible small hiatal hernia. Electronically Signed   By: MIven FinnM.D.   On: 05/22/2021 17:34   PERIPHERAL VASCULAR CATHETERIZATION  Result Date: 06/01/2021 Images from the original result were not included. Patient name: MKANEISHA CARLSTONMRN: [id number]: [birth year]Sex: female 06/01/2021 Pre-operative Diagnosis: Recurrent left lower extremity DVT Post-operative diagnosis:  Same Surgeon:  BErlene QuanC. CDonzetta Matters MD Procedure Performed: 1.  Ultrasound-guided cannulation left popliteal vein 2.  Left lower extremity venogram and central venogram 3.  Intravascular ultrasound of left femoral vein, left common femoral vein, left external and common iliac veins and IVC 4.  Balloon angioplasty of left common iliac vein with 16 mm balloon 5.  Balloon angioplasty of left external and common femoral vein with 12 mm balloon 6.  Mechanical thrombectomy with Inari Clottriever of left common iliac vein, left external iliac vein, left common femoral and femoral vein 7.  Moderate sedation with fentanyl and Versed for 59 minutes Indications: 75year old female  recently had extensive left lower extremity DVT and underwent stenting of her left common and external iliac veins for May Thurner syndrome.  She had previously been on Eliquis but was off for several days.  She has a CT scan from approximately 10 days ago for acute abdominal pain and then presented with several days of acute left lower extremity swelling.  By duplex she was found to have occluded stent and veins in the left lower extremity and was indicated for mechanical thrombectomy versus lysis. Findings: There is acute thrombus extending from the left popliteal vein through the femoral vein and the common femoral vein where there did appear to be some chronic thrombus as well.  The left external iliac vein and common iliac vein stent appeared to have acute thrombus in the external leg vein there is approximately 70% filled  with thrombus in the common iliac vein.  We first balloon dilated the distal end of the stent into the IVC and perform mechanical thrombectomy with a total of 4 passes through the stent and an additional pass through the common femoral vein on the left only.  We also balloon dilated the external leg vein and common femoral vein with a 12 mm balloon at completion.  There was flow channel from the popliteal vein through the femoral vein there was still some chronic appearing thrombus in the common femoral vein and the external iliac and common iliac vein stent was patent.  There was a flow channel without any reflux or collateralization by completion venogram.  Procedure:  The patient was identified in the holding area and taken to room 8.  The patient was then placed prone on the table and prepped and draped in the usual sterile fashion.  A time out was called.  Ultrasound was used to evaluate the left popliteal vein.  This was somewhat difficult to see but we anesthetized the area and cannulated with micropuncture needle followed by wire and sheath.  An image saved from record.  We then placed a Glidewire advantage and an 8 [place] sheath and the patient was given additional 5000 units of heparin she had recently been on heparin drip up to that point.  We then used a Berenstein catheter we placed the IVC perform venogram there confirmed intraluminal access.  We then placed the wire into the IJ and performed intravascular ultrasound from the popliteal vein including the femoral vein, common femoral vein, external and common iliac veins and the stent in the IVC.  There we found thrombosed veins from the popliteal all the way up to the external leg vein there did appear to be some channel in the common iliac vein stent but was also partially thrombosed.  There appeared to be some chronic appearing thrombus in the common femoral vein on the left.  A 16 mm balloon was used to dilate the stent particularly at the most  distal area going into the IVC at nominal pressure.  We then placed an Inari sheath under fluoroscopic guidance and then plan for mechanical thrombectomy.  We performed mechanical thrombectomy through the stent we did watch with magnified views.  We had a total of 4 passes through the stent and additional 1 pass in the common femoral vein with no additional clot returned.  The first 3 passes did have some clot the fourth pass with minimal residual clot in the first pass again with no clot returned.  We then again completed intravascular ultrasound from the popliteal vein including the femoral vein, common  femoral vein, external leg vein stent and common iliac vein stent.  We then balloon dilated the external iliac vein and the common femoral vein with a 12 mm balloon.  Completion demonstrated approximately 50% chronic stenosis of the common femoral vein the other areas were patent and there was a flow channel throughout and the stent was patent without any residual stenosis or thrombus.  We then performed completion venography which demonstrated brisk flow without any collateralization or reflux.  Satisfied with this we again anesthetize the skin placed a 3-0 nylon suture and a bolster.  The sheath was removed.  She tolerated procedure without any complication. Contrast: 30 cc Brandon C. Donzetta Matters, MD [facility] Office: [phone] Pager: [phone]   DG Abd Portable 1V  Result Date: 05/27/2021 CLINICAL DATA:  Ileus. EXAM: PORTABLE ABDOMEN - 1 VIEW COMPARISON:  May 24, 2021. FINDINGS: Post cholecystectomy. Stenting overlying the LEFT pelvis as before. Gas in the colon to the level of the rectum. Small bowel gas in the RIGHT hemiabdomen likely distal ileum. Question of fold thickening of the ileum. On limited assessment no acute regional skeletal process. Calcifications over LEFT psoas compatible with phleboliths in the ovarian vein. IMPRESSION: Signs of mild ileus. Question  enteritis. Perhaps slightly less distension of proximal small bowel loops since the prior study. Electronically Signed   By: Zetta Bills M.D.   On: 05/27/2021 09:01   DG Abd Portable 1V-Small Bowel Obstruction Protocol-initial, 8 hr delay  Result Date: 05/24/2021 CLINICAL DATA:  Small bowel obstruction 8 hour delayed film. EXAM: PORTABLE ABDOMEN - 1 VIEW COMPARISON:  05/24/2021 FINDINGS: Supine views of the abdomen were obtained 8 hours after enteric administration of contrast material. Contrast material is demonstrated throughout the colon suggesting no evidence of high-grade small bowel obstruction. A few mildly dilated gas-filled small bowel loops are present suggesting partial obstruction or ileus. Vascular stent in the pelvis. Degenerative changes in the spine and hips. Surgical clips in the right upper quadrant. IMPRESSION: Contrast material is demonstrated throughout the colon suggesting no evidence of high-grade small bowel obstruction. Changes may represent ileus or partial obstruction. Electronically Signed   By: Lucienne Capers M.D.   On: 05/24/2021 18:44   DG Abd Portable 1V-Small Bowel Protocol-Position Verification  Result Date: 05/24/2021 CLINICAL DATA:  Nasogastric tube placement. EXAM: PORTABLE ABDOMEN - 1 VIEW COMPARISON:  Abdominal radiograph dated 05/23/2021. FINDINGS: An enteric tube tip overlies the stomach, similar in position to prior exam. The gastroesophageal junction and lung bases are not imaged on this exam. IMPRESSION: Enteric tube tip overlying the stomach. Electronically Signed   By: Zerita Boers M.D.   On: 05/24/2021 10:49   DG Abd Portable 1V  Result Date: 05/23/2021 CLINICAL DATA:  NG tube placement EXAM: PORTABLE ABDOMEN - 1 VIEW COMPARISON:  CT 05/22/2021 FINDINGS: Transesophageal tube tip and side port terminate in the left upper quadrant the region of the gastric body, distal to the GE junction. Surgical clips again seen in the right upper quadrant. Vascular  stenting of the left iliac is again seen. External support devices overlie the chest. Lung bases are clear aside from some minimal atelectasis. Distended upper abdominal bowel is less well visualized on this radiograph. Please note this is neither a complete view of the chest or abdomen. IMPRESSION: Transesophageal tube tip and side port terminate in the region of the gastric body, distal to the GE junction. Electronically Signed   By: Lovena Le M.D.   On: 05/23/2021 00:49  VAS [place] LOWER EXTREMITY VENOUS (DVT) (ONLY [facility] & [facility])  Result Date: 06/01/2021  Lower Venous DVT Study Patient Name:  Diane [name]  Date of Exam:   05/30/2021 Medical Rec #: [id number]       Accession #:    [id number] Date of Birth: 03-29-1946       Patient Gender: F Patient Age:   65Y Exam [facility]  [facility] Procedure:      VAS [place] LOWER EXTREMITY VENOUS (DVT) Referring Phys: [id number] Barrie Folk --------------------------------------------------------------------------------  Indications: Edema. Other Indications: Recent admission for small bowel obstruction 05/22/21. Risk Factors: Extensive left lower extremity DVT found 02/04/21. Left lower extremity thrombectomy of the IVC, CIV, CFV, and Popliteal veins done 02/06/21. Left common external iliac artery venous stent Limitations: Edema, bowel gas and body habitus. Comparison Study: Prior study done 02/04/21 Performing Technologist: [name] RVS  Examination Guidelines: A complete evaluation includes B-mode imaging, spectral Doppler, color Doppler, and power Doppler as needed of all accessible portions of each vessel. Bilateral testing is considered an integral part of a complete examination. Limited examinations for reoccurring indications may be performed as noted. The reflux portion of the exam is performed with the patient in reverse Trendelenburg.  +-----+---------------+---------+-----------+----------+--------------+  RIGHTCompressibilityPhasicitySpontaneityPropertiesThrombus Aging +-----+---------------+---------+-----------+----------+--------------+ CFV  Full           Yes      Yes                                 +-----+---------------+---------+-----------+----------+--------------+   +---------+---------------+---------+-----------+----------+-------------------+ LEFT     CompressibilityPhasicitySpontaneityPropertiesThrombus Aging      +---------+---------------+---------+-----------+----------+-------------------+ CFV      None           No       No                   Acute               +---------+---------------+---------+-----------+----------+-------------------+ SFJ      None                                         Acute               +---------+---------------+---------+-----------+----------+-------------------+ FV Prox  None                                         Acute               +---------+---------------+---------+-----------+----------+-------------------+ FV Mid   None                                         Acute               +---------+---------------+---------+-----------+----------+-------------------+ FV DistalNone                                         Acute               +---------+---------------+---------+-----------+----------+-------------------+ PFV      None  Acute               +---------+---------------+---------+-----------+----------+-------------------+ POP      None           No       No                   Acute               +---------+---------------+---------+-----------+----------+-------------------+ PTV      None                                         Acute               +---------+---------------+---------+-----------+----------+-------------------+ PERO     None                                         Acute                +---------+---------------+---------+-----------+----------+-------------------+ CIV                                                   No color flow noted +---------+---------------+---------+-----------+----------+-------------------+ EIV                                                   Not visualized      +---------+---------------+---------+-----------+----------+-------------------+     Summary: RIGHT: - No evidence of common femoral vein obstruction.  LEFT: - Findings consistent with acute deep vein thrombosis involving the left common femoral vein, SF junction, left femoral vein, left proximal profunda vein, left popliteal vein, left posterior tibial veins, left peroneal veins, and Common iliac.  *See table(s) above for measurements and observations. Electronically signed by Ruta Hinds MD on 06/01/2021 at 3:26:43 PM.    Final      The results of significant diagnostics from this hospitalization (including imaging, microbiology, ancillary and laboratory) are listed below for reference.     Microbiology: Recent Results (from the past 240 hour(s))  Resp Panel by RT-PCR (Flu A&B, Covid) Nasopharyngeal Swab     Status: None   Collection Time: 05/31/21 12:12 AM   Specimen: Nasopharyngeal Swab; Nasopharyngeal(NP) swabs in vial transport medium  Result Value Ref Range Status   SARS Coronavirus 2 by RT PCR NEGATIVE NEGATIVE Final    Comment: (NOTE) SARS-CoV-2 target nucleic acids are NOT DETECTED.  The SARS-CoV-2 RNA is generally detectable in upper respiratory specimens during the acute phase of infection. The lowest concentration of SARS-CoV-2 viral copies this assay can detect is 138 copies/mL. A negative result does not preclude SARS-Cov-2 infection and should not be used as the sole basis for treatment or other patient management decisions. A negative result may occur with  improper specimen collection/handling, submission of specimen other than nasopharyngeal swab, presence  of viral mutation(s) within the areas targeted by this assay, and inadequate number of viral copies(<138 copies/mL). A negative result must be combined with clinical observations, patient history, and epidemiological information. The expected result is Negative.  Fact Sheet for Patients:  EntrepreneurPulse.com.au  Fact Sheet for Healthcare Providers:  IncredibleEmployment.be  This test is no t yet approved or cleared by the [place] FDA and  has been authorized for detection and/or diagnosis of SARS-CoV-2 by FDA under an Emergency Use Authorization (EUA). This EUA will remain  in effect (meaning this test can be used) for the duration of the COVID-19 declaration under Section 564(b)(1) of the Act, 21 U.S.C.section 360bbb-3(b)(1), unless the authorization is terminated  or revoked sooner.       Influenza A by PCR NEGATIVE NEGATIVE Final   Influenza B by PCR NEGATIVE NEGATIVE Final    Comment: (NOTE) The Xpert Xpress SARS-CoV-2/FLU/RSV plus assay is intended as an aid in the diagnosis of influenza from Nasopharyngeal swab specimens and should not be used as a sole basis for treatment. Nasal washings and aspirates are unacceptable for Xpert Xpress SARS-CoV-2/FLU/RSV testing.  Fact Sheet for Patients: EntrepreneurPulse.com.au  Fact Sheet for Healthcare Providers: IncredibleEmployment.be  This test is not yet approved or cleared by the [place] FDA and has been authorized for detection and/or diagnosis of SARS-CoV-2 by FDA under an Emergency Use Authorization (EUA). This EUA will remain in effect (meaning this test can be used) for the duration of the COVID-19 declaration under Section 564(b)(1) of the Act, 21 U.S.C. section 360bbb-3(b)(1), unless the authorization is terminated or revoked.  Performed at [facility], [place] [address] [place], [place] [address]      Labs: BNP (last 3  results) No results for input(s): BNP in the last 8760 hours. Basic Metabolic Panel: Recent Labs  Lab 05/31/21 0802 06/01/21 1006 06/02/21 0218 06/03/21 0932 06/04/21 0252  NA 142 137 136 140 137  K 3.3* 3.6 3.5 3.4* 3.7  CL 109 105 104 110 105  CO2 '24 24 24 23 23  '$ GLUCOSE 128* 179* 145* 113* 124*  BUN 7* 5* 6* 5* 6*  CREATININE 0.58 0.61 0.58 0.63 0.56  CALCIUM 8.6* 8.6* 8.5* 8.3* 9.0  MG  --   --   --   --  1.6*   Liver Function Tests: No results for input(s): AST, ALT, ALKPHOS, BILITOT, PROT, ALBUMIN in the last 168 hours. No results for input(s): LIPASE, AMYLASE in the last 168 hours. No results for input(s): AMMONIA in the last 168 hours. CBC: Recent Labs  Lab 05/30/21 1911 05/31/21 0802 06/01/21 0640 06/02/21 0218 06/03/21 0054 06/04/21 0252  WBC 7.4 8.0 7.5 9.3 6.8 6.5  NEUTROABS 5.3  --   --   --   --   --   HGB 10.4* 9.6* 10.1* 9.4* 8.8* 9.3*  HCT 33.0* 31.9* 33.2* 29.5* 27.8* 29.8*  MCV 86.4 86.7 85.3 83.8 84.2 83.7  PLT 227 234 234 223 234 292   Cardiac Enzymes: No results for input(s): CKTOTAL, CKMB, CKMBINDEX, TROPONINI in the last 168 hours. BNP: Invalid input(s): POCBNP CBG: Recent Labs  Lab 06/04/21 0646 06/04/21 1305 06/04/21 1709 06/04/21 2102 06/05/21 0544  GLUCAP 129* 160* 89 99 117*   D-Dimer No results for input(s): DDIMER in the last 72 hours. Hgb A1c No results for input(s): HGBA1C in the last 72 hours. Lipid Profile No results for input(s): CHOL, HDL, LDLCALC, TRIG, CHOLHDL, LDLDIRECT in the last 72 hours. Thyroid function studies No results for input(s): TSH, T4TOTAL, T3FREE, THYROIDAB in the last 72 hours.  Invalid input(s): FREET3 Anemia work up No results for input(s): VITAMINB12, FOLATE, FERRITIN, TIBC, IRON, RETICCTPCT in the last 72 hours. Urinalysis  Component Value Date/Time   COLORURINE YELLOW 01/24/2021 0805   APPEARANCEUR CLEAR 01/24/2021 0805   LABSPEC 1.024 01/24/2021 0805   PHURINE 5.0 01/24/2021 0805    GLUCOSEU NEGATIVE 01/24/2021 0805   HGBUR NEGATIVE 01/24/2021 0805   BILIRUBINUR NEGATIVE 01/24/2021 0805   KETONESUR NEGATIVE 01/24/2021 0805   PROTEINUR NEGATIVE 01/24/2021 0805   UROBILINOGEN 0.2 06/15/2014 0524   NITRITE NEGATIVE 01/24/2021 0805   LEUKOCYTESUR NEGATIVE 01/24/2021 0805   Sepsis Labs Invalid input(s): PROCALCITONIN,  WBC,  LACTICIDVEN Microbiology Recent Results (from the past 240 hour(s))  Resp Panel by RT-PCR (Flu A&B, Covid) Nasopharyngeal Swab     Status: None   Collection Time: 05/31/21 12:12 AM   Specimen: Nasopharyngeal Swab; Nasopharyngeal(NP) swabs in vial transport medium  Result Value Ref Range Status   SARS Coronavirus 2 by RT PCR NEGATIVE NEGATIVE Final    Comment: (NOTE) SARS-CoV-2 target nucleic acids are NOT DETECTED.  The SARS-CoV-2 RNA is generally detectable in upper respiratory specimens during the acute phase of infection. The lowest concentration of SARS-CoV-2 viral copies this assay can detect is 138 copies/mL. A negative result does not preclude SARS-Cov-2 infection and should not be used as the sole basis for treatment or other patient management decisions. A negative result may occur with  improper specimen collection/handling, submission of specimen other than nasopharyngeal swab, presence of viral mutation(s) within the areas targeted by this assay, and inadequate number of viral copies(<138 copies/mL). A negative result must be combined with clinical observations, patient history, and epidemiological information. The expected result is Negative.  Fact Sheet for Patients:  EntrepreneurPulse.com.au  Fact Sheet for Healthcare Providers:  IncredibleEmployment.be  This test is no t yet approved or cleared by the [place] FDA and  has been authorized for detection and/or diagnosis of SARS-CoV-2 by FDA under an Emergency Use Authorization (EUA). This EUA will remain  in effect (meaning this  test can be used) for the duration of the COVID-19 declaration under Section 564(b)(1) of the Act, 21 U.S.C.section 360bbb-3(b)(1), unless the authorization is terminated  or revoked sooner.       Influenza A by PCR NEGATIVE NEGATIVE Final   Influenza B by PCR NEGATIVE NEGATIVE Final    Comment: (NOTE) The Xpert Xpress SARS-CoV-2/FLU/RSV plus assay is intended as an aid in the diagnosis of influenza from Nasopharyngeal swab specimens and should not be used as a sole basis for treatment. Nasal washings and aspirates are unacceptable for Xpert Xpress SARS-CoV-2/FLU/RSV testing.  Fact Sheet for Patients: EntrepreneurPulse.com.au  Fact Sheet for Healthcare Providers: IncredibleEmployment.be  This test is not yet approved or cleared by the [place] FDA and has been authorized for detection and/or diagnosis of SARS-CoV-2 by FDA under an Emergency Use Authorization (EUA). This EUA will remain in effect (meaning this test can be used) for the duration of the COVID-19 declaration under Section 564(b)(1) of the Act, 21 U.S.C. section 360bbb-3(b)(1), unless the authorization is terminated or revoked.  Performed at [facility], [place] [address] [place], [place] [address]      Time coordinating discharge:  I have spent 35 minutes face to face with the patient and on the ward discussing the patients care, assessment, plan and disposition with other care givers. >50% of the time was devoted counseling the patient about the risks and benefits of treatment/Discharge disposition and coordinating care.   SIGNED:   Damita Lack, MD  [facility] 06/05/2021, 11:48 AM   If 7PM-7AM, please contact night-coverage

## 2021-06-05 NOTE — Progress Notes (Signed)
Physical Therapy Treatment Patient Details Name: Diane [name] MRN: [id number] DOB: 1946/07/27 Today's Date: 06/05/2021    History of Present Illness 75 yo female with onset of L LE swelling and pain, found to have L LE DVT. S/P stent.   PMHx:  DVT, SBO, RA, DM, GERD, HTN, cholecystectomy, hernia repair    PT Comments    Pt was seen for mobility on walker to use BR and to walk on the hallway, and mainly struggles with lower height transfers.  Has plan to be home with help, and will need HHPT to progress with strength and tolerance for using LLE to power up.  Focus on standing endurance and transfers with assist as needed, until dc is completed.   Follow Up Recommendations  Home health PT     Equipment Recommendations  None recommended by PT    Recommendations for Other Services       Precautions / Restrictions Precautions Precautions: Fall Precaution Comments: mild fall risk Restrictions Weight Bearing Restrictions: No    Mobility  Bed Mobility Overal bed mobility: Modified Independent (moved bed covers only)                  Transfers Overall transfer level: Needs assistance Equipment used: Rolling walker (2 wheeled);1 person hand held assist Transfers: Sit to/from Stand Sit to Stand: Min guard         General transfer comment: reminders for safety and sequence  Ambulation/Gait Ambulation/Gait assistance: Min guard Gait Distance (Feet): 130 Feet Assistive device: Rolling walker (2 wheeled);1 person hand held assist Gait Pattern/deviations: Step-to pattern;Step-through pattern;Decreased stride length;Decreased weight shift to left Gait velocity: reduced Gait velocity interpretation: <1.31 ft/sec, indicative of household ambulator General Gait Details: min guard due to confined cluttered room   Stairs             Wheelchair Mobility    Modified Rankin (Stroke Patients Only)       Balance Overall balance assessment: Modified  Independent Sitting-balance support: Feet supported Sitting balance-Leahy Scale: Normal     Standing balance support: Bilateral upper extremity supported;During functional activity Standing balance-Leahy Scale: Fair                              Cognition Arousal/Alertness: Awake/alert Behavior During Therapy: WFL for tasks assessed/performed;Flat affect Overall Cognitive Status: Within Functional Limits for tasks assessed                                 General Comments: pt is very motivated to move and get away from being in bed      Exercises      General Comments General comments (skin integrity, edema, etc.): pt is in walker maneuvering to BR and to the hallway, good awareness of safety and controlled vitals with trip      Pertinent Vitals/Pain Pain Assessment: No/denies pain    Home Living                      Prior Function            PT Goals (current goals can now be found in the care plan section) Acute Rehab PT Goals Patient Stated Goal: home to the dog Progress towards PT goals: Progressing toward goals    Frequency    Min 3X/week      PT Plan Current plan remains  appropriate    Co-evaluation              AM-PAC PT "6 Clicks" Mobility   Outcome Measure  Help needed turning from your back to your side while in a flat bed without using bedrails?: A Little Help needed moving from lying on your back to sitting on the side of a flat bed without using bedrails?: A Little Help needed moving to and from a bed to a chair (including a wheelchair)?: A Little Help needed standing up from a chair using your arms (e.g., wheelchair or bedside chair)?: A Little Help needed to walk in hospital room?: A Little Help needed climbing 3-5 steps with a railing? : A Little 6 Click Score: 18    End of Session Equipment Utilized During Treatment: Gait belt Activity Tolerance: Patient tolerated treatment well;Patient limited by  fatigue Patient left: in chair;with call bell/phone within reach Nurse Communication: Mobility status PT Visit Diagnosis: Muscle weakness (generalized) (M62.81);Difficulty in walking, not elsewhere classified (R26.2)     Time: [id number] PT Time Calculation (min) (ACUTE ONLY): 28 min  Charges:  $Gait Training: 8-22 mins $Therapeutic Activity: 8-22 mins                    Ramond Dial 06/05/2021, 1:40 PM  Mee Hives, PT MS Acute Rehab Dept. Number: [place]

## 2021-06-08 ENCOUNTER — Other Ambulatory Visit: Payer: Self-pay | Admitting: *Deleted

## 2021-06-08 NOTE — Patient Outreach (Signed)
[name]) Care Management  06/08/2021  Diane [name] 1946-05-28 [id number]   [facility] post hospital unsuccessful outreach   Outreach attempt to the home number [phone] No answer. [facility] RN CM left HIPAA [facility] Portability and Accountability Act) compliant voicemail message along with CM's contact info.   Plan: [facility] RN CM scheduled this patient for another call attempt within 4-7 business days Unsuccessful outreach letter sent on 06/08/21 Unsuccessful outreach on 06/08/21   Joelene Millin L. Lavina Hamman, RN, BSN, [place] Coordinator Office number [phone] Mobile number [phone]  Main [facility] number [phone] Fax number [phone]

## 2021-06-10 DIAGNOSIS — K5669 Other partial intestinal obstruction: Secondary | ICD-10-CM | POA: Diagnosis not present

## 2021-06-10 DIAGNOSIS — I82412 Acute embolism and thrombosis of left femoral vein: Secondary | ICD-10-CM | POA: Diagnosis not present

## 2021-06-10 DIAGNOSIS — I82432 Acute embolism and thrombosis of left popliteal vein: Secondary | ICD-10-CM | POA: Diagnosis not present

## 2021-06-10 DIAGNOSIS — Z48812 Encounter for surgical aftercare following surgery on the circulatory system: Secondary | ICD-10-CM | POA: Diagnosis not present

## 2021-06-10 DIAGNOSIS — I10 Essential (primary) hypertension: Secondary | ICD-10-CM | POA: Diagnosis not present

## 2021-06-12 ENCOUNTER — Other Ambulatory Visit: Payer: Self-pay | Admitting: *Deleted

## 2021-06-12 NOTE — Patient Outreach (Signed)
[facility]) Care Management  06/12/2021  Diane [name] 19-Dec-1945 [id number]   [facility] second post hospital unsuccessful outreach     Outreach attempt to the home number [phone] No answer. [facility] RN CM left HIPAA [facility] Portability and Accountability Act) compliant voicemail message along with CM's contact info.    Plan: [facility] RN CM scheduled this patient for another call attempt within 4-7 business days Unsuccessful outreach letter sent on 06/08/21 Unsuccessful outreach on 06/08/21, 06/12/21     Joelene Millin L. Lavina Hamman, RN, BSN, [place] Coordinator Office number [phone] Mobile number [phone]  Main [facility] number [phone] Fax number [phone]

## 2021-06-15 ENCOUNTER — Telehealth (HOSPITAL_COMMUNITY): Payer: Self-pay | Admitting: Pharmacist

## 2021-06-15 NOTE — Telephone Encounter (Signed)
Unable to reach patient, no vm.

## 2021-06-17 ENCOUNTER — Telehealth (HOSPITAL_COMMUNITY): Payer: Self-pay | Admitting: Pharmacist

## 2021-06-17 DIAGNOSIS — I10 Essential (primary) hypertension: Secondary | ICD-10-CM | POA: Diagnosis not present

## 2021-06-17 DIAGNOSIS — Z48812 Encounter for surgical aftercare following surgery on the circulatory system: Secondary | ICD-10-CM | POA: Diagnosis not present

## 2021-06-17 DIAGNOSIS — K5669 Other partial intestinal obstruction: Secondary | ICD-10-CM | POA: Diagnosis not present

## 2021-06-17 DIAGNOSIS — I82432 Acute embolism and thrombosis of left popliteal vein: Secondary | ICD-10-CM | POA: Diagnosis not present

## 2021-06-17 DIAGNOSIS — I82412 Acute embolism and thrombosis of left femoral vein: Secondary | ICD-10-CM | POA: Diagnosis not present

## 2021-06-17 NOTE — Telephone Encounter (Signed)
Pharmacy Transitions of Care Follow-up Telephone Call   Date of discharge: 06/05/2021  Discharge Diagnosis: DVT   How have you been since you were released from the hospital?  Good   Medication changes made at discharge:  - START: Low dose ASA  - STOPPED: Metformin  - CHANGED: Eliquis   Medication changes verified by the patient?  (Yes)     Medication Accessibility:   Was the patient provided with refills on discharged medications? No    Have all prescriptions been transferred from [facility] to home pharmacy? NA    Is the patient able to afford medications? Yes     Medication Review: APIXABAN (ELIQUIS)  Apixaban 10 mg BID initiated on 06/05/2021. Will switch to apixaban 5 mg BID after 7 days (DATE08/08/2021).  - Discussed importance of taking medication around the same time everyday  - Reviewed potential DDIs with patient  - Advised patient of medications to avoid (NSAIDs, ASA)  - Educated that Tylenol (acetaminophen) will be the preferred analgesic to prevent risk of bleeding  - Emphasized importance of monitoring for signs and symptoms of bleeding (abnormal bruising, prolonged bleeding, nose bleeds, bleeding from gums, discolored urine, black tarry stools)  - Advised patient to alert all providers of anticoagulation therapy prior to starting a new medication or having a procedure    Follow-up Appointments:   PCP Hospital f/u appt confirmed?  Scheduled to see  08/26     Specialist Hospital f/u appt confirmed?  yes   If their condition worsens, is the pt aware to call PCP or go to the Emergency Dept.? Yes   Final Patient Assessment:  Doing well since discharge with the exception of some redness.  She has a call into her MD.

## 2021-06-18 ENCOUNTER — Other Ambulatory Visit: Payer: Self-pay | Admitting: *Deleted

## 2021-06-18 NOTE — Patient Outreach (Signed)
[facility]) Care Management  06/18/2021  BENEDICTA REVARD 22-Sep-1946 [id number]   [facility] third  post hospital unsuccessful outreach     Outreach attempt to the home number [phone] No answer. [facility] RN CM left HIPAA [facility] Portability and Accountability Act) compliant voicemail message along with CM's contact info.    Plan: [facility] RN CM scheduled this patient for another call attempt within 21 business days Unsuccessful outreach letter sent on 06/08/21 Unsuccessful outreach on 06/08/21, 06/12/21, 06/18/21     Joelene Millin L. Lavina Hamman, RN, BSN, [place] Coordinator Office number [phone] Mobile number [phone]  Main [facility] number [phone] Fax number [phone]

## 2021-06-23 ENCOUNTER — Other Ambulatory Visit: Payer: Self-pay | Admitting: Adult Health

## 2021-06-23 DIAGNOSIS — E785 Hyperlipidemia, unspecified: Secondary | ICD-10-CM

## 2021-06-24 DIAGNOSIS — I82492 Acute embolism and thrombosis of other specified deep vein of left lower extremity: Secondary | ICD-10-CM | POA: Diagnosis not present

## 2021-06-24 DIAGNOSIS — E1165 Type 2 diabetes mellitus with hyperglycemia: Secondary | ICD-10-CM | POA: Diagnosis not present

## 2021-06-24 DIAGNOSIS — E782 Mixed hyperlipidemia: Secondary | ICD-10-CM | POA: Diagnosis not present

## 2021-06-24 DIAGNOSIS — Z7901 Long term (current) use of anticoagulants: Secondary | ICD-10-CM | POA: Diagnosis not present

## 2021-06-24 DIAGNOSIS — E876 Hypokalemia: Secondary | ICD-10-CM | POA: Diagnosis not present

## 2021-06-25 DIAGNOSIS — I82432 Acute embolism and thrombosis of left popliteal vein: Secondary | ICD-10-CM | POA: Diagnosis not present

## 2021-06-25 DIAGNOSIS — Z48812 Encounter for surgical aftercare following surgery on the circulatory system: Secondary | ICD-10-CM | POA: Diagnosis not present

## 2021-06-25 DIAGNOSIS — I10 Essential (primary) hypertension: Secondary | ICD-10-CM | POA: Diagnosis not present

## 2021-06-25 DIAGNOSIS — K5669 Other partial intestinal obstruction: Secondary | ICD-10-CM | POA: Diagnosis not present

## 2021-06-25 DIAGNOSIS — I82412 Acute embolism and thrombosis of left femoral vein: Secondary | ICD-10-CM | POA: Diagnosis not present

## 2021-06-26 DIAGNOSIS — Z48812 Encounter for surgical aftercare following surgery on the circulatory system: Secondary | ICD-10-CM | POA: Diagnosis not present

## 2021-06-26 DIAGNOSIS — I82412 Acute embolism and thrombosis of left femoral vein: Secondary | ICD-10-CM | POA: Diagnosis not present

## 2021-06-26 DIAGNOSIS — I82432 Acute embolism and thrombosis of left popliteal vein: Secondary | ICD-10-CM | POA: Diagnosis not present

## 2021-06-26 DIAGNOSIS — I10 Essential (primary) hypertension: Secondary | ICD-10-CM | POA: Diagnosis not present

## 2021-07-01 ENCOUNTER — Ambulatory Visit (HOSPITAL_COMMUNITY)
Admission: EM | Admit: 2021-07-01 | Discharge: 2021-07-01 | Disposition: A | Discharge status: Home / Self Care | Payer: Medicare Other | Attending: Medical Oncology | Admitting: Medical Oncology

## 2021-07-01 ENCOUNTER — Observation Stay (HOSPITAL_COMMUNITY)
Admission: EM | Admit: 2021-07-01 | Discharge: 2021-07-03 | Disposition: A | Discharge status: Home / Self Care | Payer: Medicare Other | Attending: Family Medicine | Admitting: Family Medicine

## 2021-07-01 ENCOUNTER — Emergency Department (HOSPITAL_COMMUNITY): Payer: Medicare Other

## 2021-07-01 ENCOUNTER — Encounter (HOSPITAL_COMMUNITY): Payer: Self-pay

## 2021-07-01 ENCOUNTER — Encounter (HOSPITAL_COMMUNITY): Payer: Self-pay | Admitting: *Deleted

## 2021-07-01 ENCOUNTER — Other Ambulatory Visit: Payer: Self-pay

## 2021-07-01 DIAGNOSIS — K449 Diaphragmatic hernia without obstruction or gangrene: Secondary | ICD-10-CM | POA: Diagnosis not present

## 2021-07-01 DIAGNOSIS — R079 Chest pain, unspecified: Secondary | ICD-10-CM | POA: Diagnosis present

## 2021-07-01 DIAGNOSIS — E119 Type 2 diabetes mellitus without complications: Secondary | ICD-10-CM | POA: Diagnosis not present

## 2021-07-01 DIAGNOSIS — R0602 Shortness of breath: Secondary | ICD-10-CM

## 2021-07-01 DIAGNOSIS — Z7984 Long term (current) use of oral hypoglycemic drugs: Secondary | ICD-10-CM | POA: Diagnosis not present

## 2021-07-01 DIAGNOSIS — I10 Essential (primary) hypertension: Secondary | ICD-10-CM | POA: Diagnosis not present

## 2021-07-01 DIAGNOSIS — R0789 Other chest pain: Principal | ICD-10-CM | POA: Insufficient documentation

## 2021-07-01 DIAGNOSIS — Z7901 Long term (current) use of anticoagulants: Secondary | ICD-10-CM | POA: Diagnosis not present

## 2021-07-01 DIAGNOSIS — Z79899 Other long term (current) drug therapy: Secondary | ICD-10-CM | POA: Insufficient documentation

## 2021-07-01 DIAGNOSIS — Z9861 Coronary angioplasty status: Secondary | ICD-10-CM | POA: Diagnosis not present

## 2021-07-01 DIAGNOSIS — Z96611 Presence of right artificial shoulder joint: Secondary | ICD-10-CM | POA: Diagnosis not present

## 2021-07-01 DIAGNOSIS — Z86718 Personal history of other venous thrombosis and embolism: Secondary | ICD-10-CM

## 2021-07-01 DIAGNOSIS — Z7982 Long term (current) use of aspirin: Secondary | ICD-10-CM | POA: Insufficient documentation

## 2021-07-01 DIAGNOSIS — Z20822 Contact with and (suspected) exposure to covid-19: Secondary | ICD-10-CM | POA: Insufficient documentation

## 2021-07-01 DIAGNOSIS — I2510 Atherosclerotic heart disease of native coronary artery without angina pectoris: Secondary | ICD-10-CM | POA: Diagnosis not present

## 2021-07-01 DIAGNOSIS — I824Y9 Acute embolism and thrombosis of unspecified deep veins of unspecified proximal lower extremity: Secondary | ICD-10-CM

## 2021-07-01 DIAGNOSIS — E1140 Type 2 diabetes mellitus with diabetic neuropathy, unspecified: Secondary | ICD-10-CM

## 2021-07-01 DIAGNOSIS — R9431 Abnormal electrocardiogram [ECG] [EKG]: Secondary | ICD-10-CM | POA: Diagnosis not present

## 2021-07-01 LAB — RESP PANEL BY RT-PCR (FLU A&B, COVID) ARPGX2
Influenza A by PCR: NEGATIVE
Influenza B by PCR: NEGATIVE
SARS Coronavirus 2 by RT PCR: NEGATIVE

## 2021-07-01 LAB — COMPREHENSIVE METABOLIC PANEL
ALT: 12 U/L (ref 0–44)
AST: 16 U/L (ref 15–41)
Albumin: 3.7 g/dL (ref 3.5–5.0)
Alkaline Phosphatase: 52 U/L (ref 38–126)
Anion gap: 7 (ref 5–15)
BUN: 10 mg/dL (ref 8–23)
CO2: 27 mmol/L (ref 22–32)
Calcium: 9.5 mg/dL (ref 8.9–10.3)
Chloride: 107 mmol/L (ref 98–111)
Creatinine, Ser: 0.59 mg/dL (ref 0.44–1.00)
GFR, Estimated: >60 mL/min (ref >60)
Glucose, Bld: 100 mg/dL — ABNORMAL HIGH (ref 70–99)
Potassium: 4 mmol/L (ref 3.5–5.1)
Sodium: 141 mmol/L (ref 135–145)
Total Bilirubin: 0.6 mg/dL (ref 0.3–1.2)
Total Protein: 7.2 g/dL (ref 6.5–8.1)

## 2021-07-01 LAB — CBC WITH DIFFERENTIAL/PLATELET
Abs Immature Granulocytes: 0.02 10*3/uL (ref 0.00–0.07)
Basophils Absolute: 0 10*3/uL (ref 0.0–0.1)
Basophils Relative: 0 %
Eosinophils Absolute: 0.1 10*3/uL (ref 0.0–0.5)
Eosinophils Relative: 1 %
HCT: 33.7 % — ABNORMAL LOW (ref 36.0–46.0)
Hemoglobin: 10.2 g/dL — ABNORMAL LOW (ref 12.0–15.0)
Immature Granulocytes: 0 %
Lymphocytes Relative: 28 %
Lymphs Abs: 1.8 10*3/uL (ref 0.7–4.0)
MCH: 25.7 pg — ABNORMAL LOW (ref 26.0–34.0)
MCHC: 30.3 g/dL (ref 30.0–36.0)
MCV: 84.9 fL (ref 80.0–100.0)
Monocytes Absolute: 0.6 10*3/uL (ref 0.1–1.0)
Monocytes Relative: 9 %
Neutro Abs: 3.9 10*3/uL (ref 1.7–7.7)
Neutrophils Relative %: 62 %
Platelets: 249 10*3/uL (ref 150–400)
RBC: 3.97 MIL/uL (ref 3.87–5.11)
RDW: 17.2 % — ABNORMAL HIGH (ref 11.5–15.5)
WBC: 6.5 10*3/uL (ref 4.0–10.5)
nRBC: 0 % (ref 0.0–0.2)

## 2021-07-01 LAB — TROPONIN I (HIGH SENSITIVITY)
Troponin I (High Sensitivity): 5 ng/L (ref <18)
Troponin I (High Sensitivity): 6 ng/L (ref <18)

## 2021-07-01 IMAGING — CT CT ANGIO CHEST
2 of 14 series · 11 of 46 positions shown · IV contrast (APPLIED)
Comparison: CT angiography chest 05/31/2021

CLINICAL DATA: Chest pain.  PE suspected, high prob

EXAM:
CT ANGIOGRAPHY CHEST WITH CONTRAST
TECHNIQUE: Multidetector CT imaging of the chest was performed using the
standard protocol during bolus administration of intravenous
contrast. Multiplanar CT image reconstructions and MIPs were
obtained to evaluate the vascular anatomy. Repeat intravenous
injection was administered due to poor timing of intravenous
contrast on the first images.
CONTRAST:  100mL OMNIPAQUE IOHEXOL 350 MG/ML SOLN

[Series 10: coronal mpr · coronal · 0.48mm/px · 1 of 119 slices shown]
[im 60/119  soft-tissue]
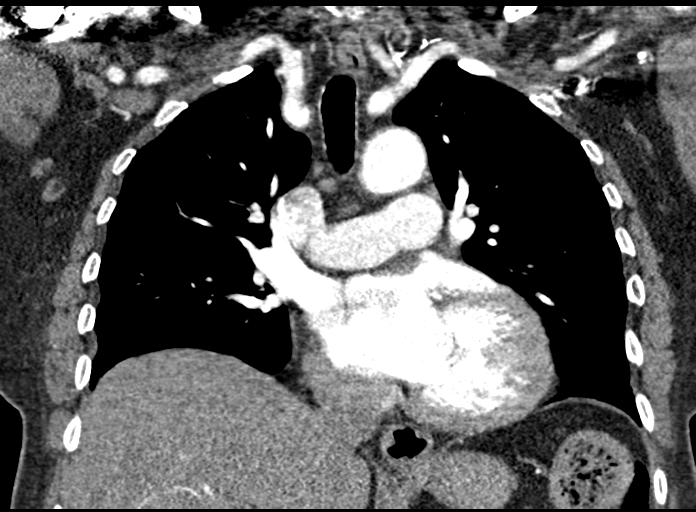

[Series 15: thins · axial · 0.66mm/px · z∈[-98,+100]mm · 10 of 242 slices shown]
[im 22/242  lung]
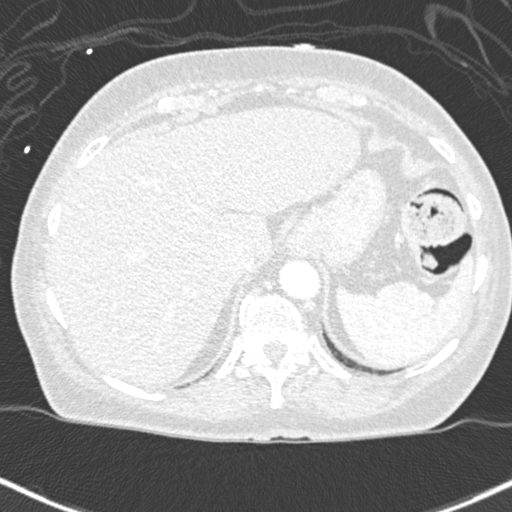
[im 44/242  soft-tissue]
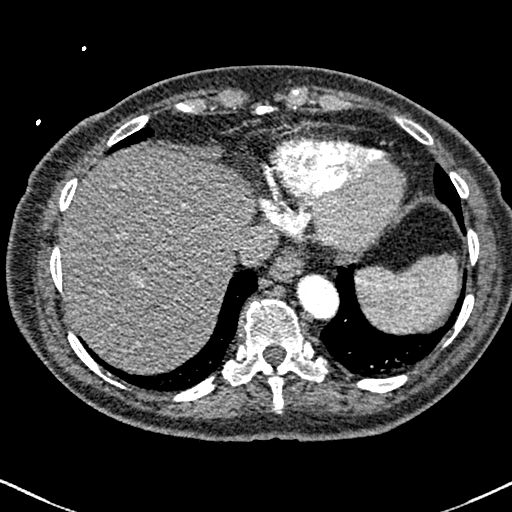
[im 66/242  lung]
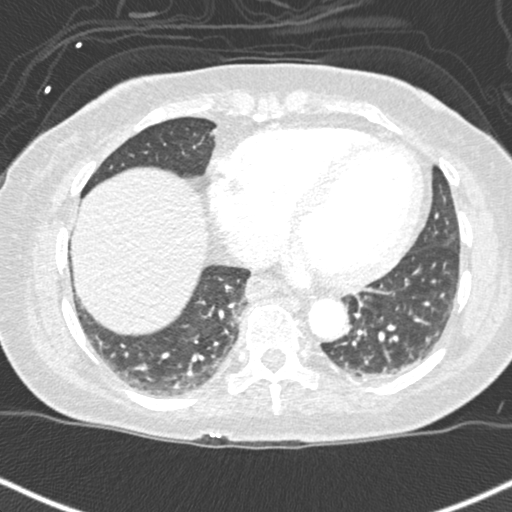
[im 88/242  soft-tissue]
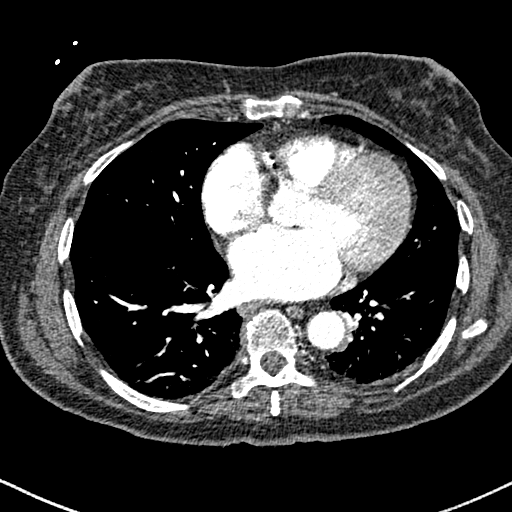
[im 110/242  lung]
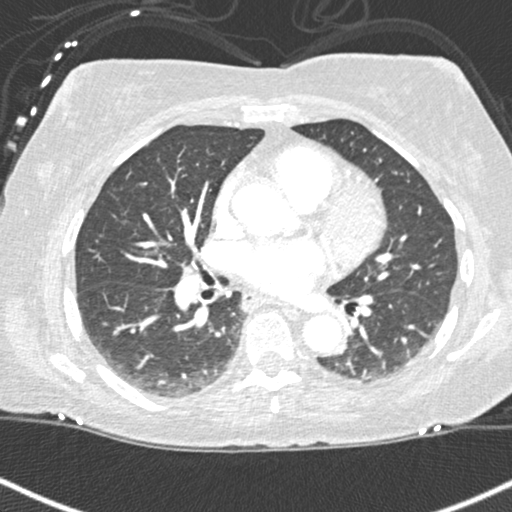
[im 132/242  soft-tissue]
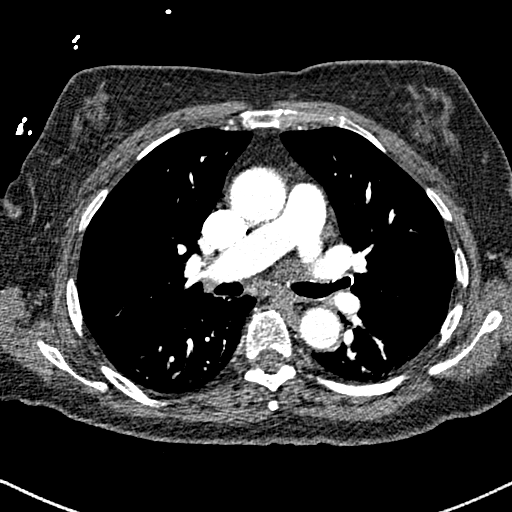
[im 154/242  lung]
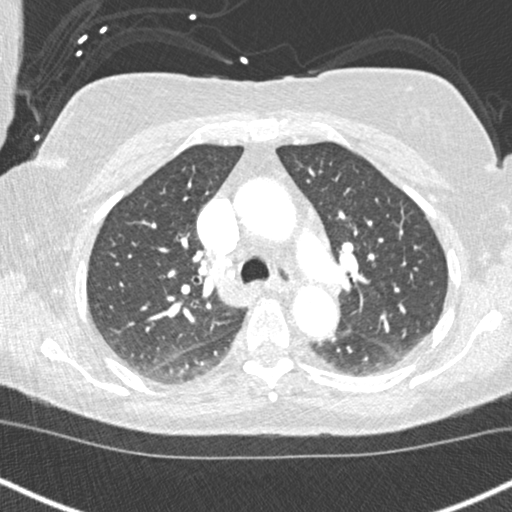
[im 176/242  soft-tissue]
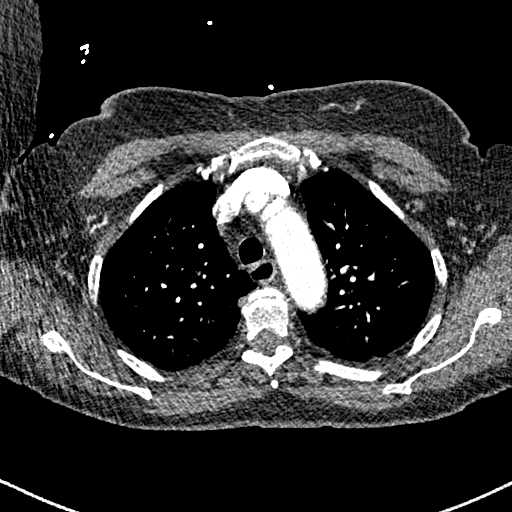
[im 198/242  lung]
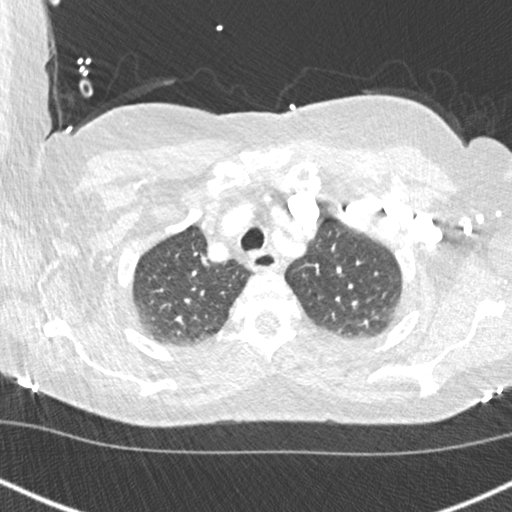
[im 220/242  soft-tissue]
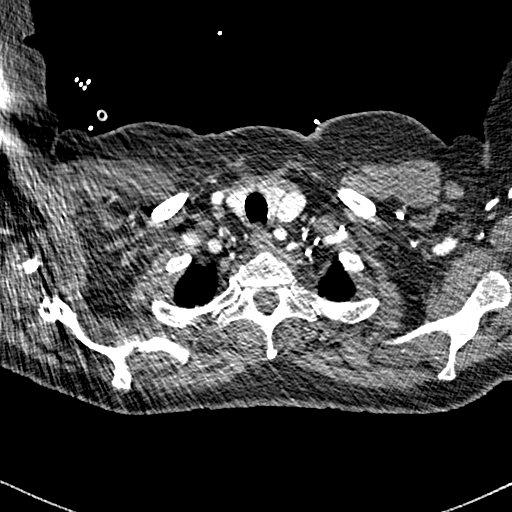

[11 of 46 positions shown; findings below may reference images not displayed]

FINDINGS: Cardiovascular: Satisfactory opacification of the pulmonary arteries
to the segmental level. No evidence of pulmonary embolism. Normal
heart size. No significant pericardial effusion. The thoracic aorta
is normal in caliber. Mild atherosclerotic plaque of the thoracic
aorta. Likely three-vessel coronary artery calcifications. Tiny
hiatal hernia.

Mediastinum/Nodes: No enlarged mediastinal, hilar, or axillary lymph
nodes. Thyroid gland, trachea, and esophagus demonstrate no
significant findings.

Lungs/Pleura: No focal consolidation. No pulmonary nodule. No
pulmonary mass. No pleural effusion. No pneumothorax.

Upper Abdomen: No acute abnormality.

Musculoskeletal:

No chest wall abnormality.  Bilateral gynecomastia.

No suspicious lytic or blastic osseous lesions. No acute displaced
fracture.

Review of the MIP images confirms the above findings.
IMPRESSION: 1. No pulmonary embolus.
2. No acute intrapulmonary abnormality.
3. Tiny hiatal hernia.
4. Aortic Atherosclerosis ([id number]-YNI.I) including three-vessel
coronary artery calcifications.

## 2021-07-01 MED ORDER — NITROGLYCERIN 0.4 MG SL SUBL
0.4000 mg | SUBLINGUAL_TABLET | Freq: Once | SUBLINGUAL | Status: AC
  Administered 2021-07-01: 0.4 mg via SUBLINGUAL
  Filled 2021-07-01: qty 1

## 2021-07-01 MED ORDER — IOHEXOL 350 MG/ML SOLN
100.0000 mL | Freq: Once | INTRAVENOUS | Status: AC | PRN
  Administered 2021-07-01: 100 mL via INTRAVENOUS

## 2021-07-01 MED ORDER — ASPIRIN 81 MG PO CHEW
324.0000 mg | CHEWABLE_TABLET | Freq: Once | ORAL | Status: AC
  Administered 2021-07-01: 324 mg via ORAL
  Filled 2021-07-01: qty 4

## 2021-07-01 NOTE — ED Notes (Signed)
Pt assisted to bedside commode and back into bed

## 2021-07-01 NOTE — ED Notes (Signed)
Pt given juice and snacks

## 2021-07-01 NOTE — ED Provider Notes (Signed)
Emergency Medicine Provider Triage Evaluation Note  Diane [name] , a 75 y.o. female  was evaluated in triage.  Pt complains of chest pain, shortness of breath since today. She was seen at [facility] and sent here. She reports the chest pain is worse when she is walking around, and improves with rest. She endorse recent blood clot in leg for which she was hospitalized. She is on a blood thinner and has not missed any doses. No nausea, diaphoresis, palpitations. She does endorse a headache with some chest pain this weekend that resolved with taking her blood pressure medication, no headache today.  Review of Systems  Positive: As above Negative: As above  Physical Exam  There were no vitals taken for this visit. Gen:   Awake, no distress   Resp:  Normal effort  MSK:   Moves extremities without difficulty  Other:    Medical Decision Making  Medically screening exam initiated at 3:45 PM.  Appropriate orders placed.  Diane [name] was informed that the remainder of the evaluation will be completed by another provider, this initial triage assessment does not replace that evaluation, and the importance of remaining in the ED until their evaluation is complete.  Chest pain   Anselmo Pickler, PA-C 07/01/21 1547    Daleen Bo, MD 07/02/21 [phone]

## 2021-07-01 NOTE — ED Triage Notes (Signed)
Burman Freestone, PA, to triage room to assess pt.  Pt referred to ED for eval d/t recent hx and risk factors.  Pt refuses EMS.  Will have daughter take her to ED.  Pt to lobby, advised to notify staff if she has an increase in CP or SOB.
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
## 2021-07-01 NOTE — ED Triage Notes (Signed)
Pt c/o chest pain when walks since yesterday  she was sent from ucc for treatment alert and oriented skin warm and dry  no distress  no previous history of cardiac  problems

## 2021-07-01 NOTE — ED Triage Notes (Signed)
Pt reports left sided chest heaviness, headache and shortness of breath x 1 day.
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
## 2021-07-01 NOTE — Discharge Instructions (Addendum)
Please go directly to the hospital for further evaluation

## 2021-07-01 NOTE — ED Notes (Signed)
Pt to ct 

## 2021-07-02 ENCOUNTER — Observation Stay (HOSPITAL_BASED_OUTPATIENT_CLINIC_OR_DEPARTMENT_OTHER): Payer: Medicare Other

## 2021-07-02 ENCOUNTER — Observation Stay (HOSPITAL_COMMUNITY): Payer: Medicare Other

## 2021-07-02 ENCOUNTER — Encounter (HOSPITAL_COMMUNITY): Payer: Self-pay | Admitting: Internal Medicine

## 2021-07-02 DIAGNOSIS — R072 Precordial pain: Secondary | ICD-10-CM

## 2021-07-02 DIAGNOSIS — R0602 Shortness of breath: Secondary | ICD-10-CM | POA: Diagnosis not present

## 2021-07-02 DIAGNOSIS — R0609 Other forms of dyspnea: Secondary | ICD-10-CM | POA: Diagnosis not present

## 2021-07-02 DIAGNOSIS — R079 Chest pain, unspecified: Secondary | ICD-10-CM

## 2021-07-02 LAB — ECHOCARDIOGRAM COMPLETE
AR max vel: 1.14 cm2
AV Area VTI: 1.21 cm2
AV Area mean vel: 1.05 cm2
AV Mean grad: 7 mmHg
AV Peak grad: 12.4 mmHg
Ao pk vel: 1.76 m/s
Area-P 1/2: 3.99 cm2
Height: 68 in
S' Lateral: 3.3 cm
Weight: 3283.2 oz

## 2021-07-02 LAB — BASIC METABOLIC PANEL
Anion gap: 7 (ref 5–15)
BUN: 7 mg/dL — ABNORMAL LOW (ref 8–23)
CO2: 27 mmol/L (ref 22–32)
Calcium: 9.2 mg/dL (ref 8.9–10.3)
Chloride: 106 mmol/L (ref 98–111)
Creatinine, Ser: 0.52 mg/dL (ref 0.44–1.00)
GFR, Estimated: >60 mL/min (ref >60)
Glucose, Bld: 117 mg/dL — ABNORMAL HIGH (ref 70–99)
Potassium: 3.3 mmol/L — ABNORMAL LOW (ref 3.5–5.1)
Sodium: 140 mmol/L (ref 135–145)

## 2021-07-02 LAB — TSH: TSH: 1.817 u[IU]/mL (ref 0.350–4.500)

## 2021-07-02 LAB — CBC
HCT: 31.2 % — ABNORMAL LOW (ref 36.0–46.0)
Hemoglobin: 9.5 g/dL — ABNORMAL LOW (ref 12.0–15.0)
MCH: 25.7 pg — ABNORMAL LOW (ref 26.0–34.0)
MCHC: 30.4 g/dL (ref 30.0–36.0)
MCV: 84.3 fL (ref 80.0–100.0)
Platelets: 252 10*3/uL (ref 150–400)
RBC: 3.7 MIL/uL — ABNORMAL LOW (ref 3.87–5.11)
RDW: 17.4 % — ABNORMAL HIGH (ref 11.5–15.5)
WBC: 6 10*3/uL (ref 4.0–10.5)
nRBC: 0 % (ref 0.0–0.2)

## 2021-07-02 LAB — GLUCOSE, CAPILLARY
Glucose-Capillary: 119 mg/dL — ABNORMAL HIGH (ref 70–99)
Glucose-Capillary: 197 mg/dL — ABNORMAL HIGH (ref 70–99)
Glucose-Capillary: 131 mg/dL — ABNORMAL HIGH (ref 70–99)

## 2021-07-02 LAB — C-REACTIVE PROTEIN: CRP: <0.5 mg/dL (ref <1.0)

## 2021-07-02 LAB — BRAIN NATRIURETIC PEPTIDE: B Natriuretic Peptide: 51.6 pg/mL (ref 0.0–100.0)

## 2021-07-02 LAB — SEDIMENTATION RATE: Sed Rate: 16 mm/hr (ref 0–22)

## 2021-07-02 IMAGING — CT CT HEART MORP W/ CTA COR W/ SCORE W/ CA W/CM &/OR W/O CM
4 of 7 series · 8 of 20 positions shown, 9 images · IV contrast (APPLIED)
Comparison: CT angiography of the chest dated [DATE] of 7277.
COMPARISON: CT angiography of the chest dated [DATE] of 7277.

Addendum:
EXAM:
OVER-READ INTERPRETATION  CT CHEST

The following report is an over-read performed by radiologist Dr.
over-read does not include interpretation of cardiac or coronary
anatomy or pathology. The coronary calcium score/coronary CTA
interpretation by the cardiologist is attached.
CLINICAL DATA: Chest pain
Cardiac/Coronary CTA
TECHNIQUE: A non-contrast, gated CT scan was obtained with axial slices of 3 mm
through the heart for calcium scoring. Calcium scoring was performed
using the Agatston method. A 120 kV prospective, gated, contrast
cardiac scan was obtained. Gantry rotation speed was 250 msecs and
collimation was 0.6 mm. Two sublingual nitroglycerin tablets (0.8
mg) were given. The 3D data set was reconstructed in 5% intervals of
the 35-75% of the R-R cycle. Diastolic phases were analyzed on a
dedicated workstation using MPR, MIP, and VRT modes. The patient
received 95 cc of contrast.

[Series 6: best diast 72 % · axial · 0.39mm/px · z∈[-271,-230]mm · 2 of 306 slices shown]
[im 102/306  vessel]
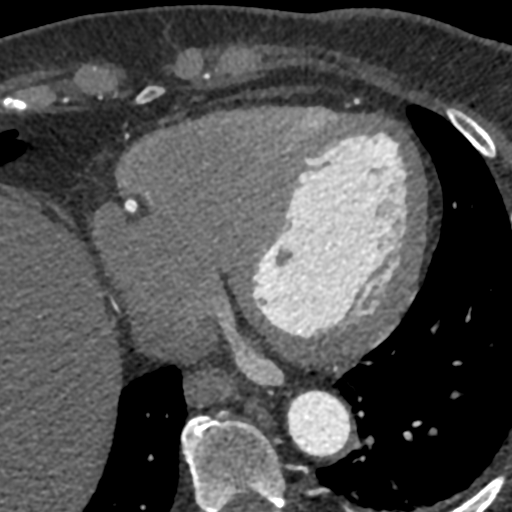
[im 204/306  vessel]
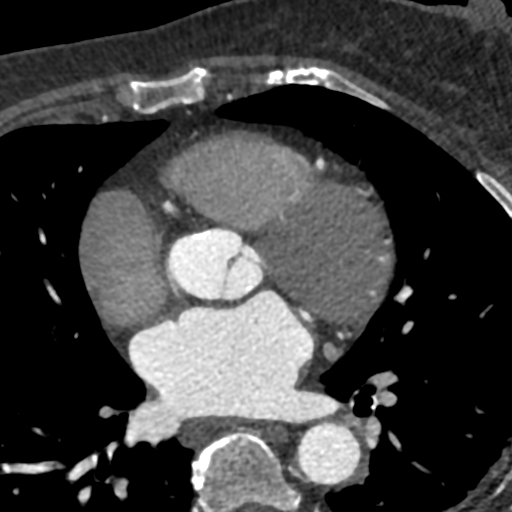

[Series 7: best syst · axial · 0.39mm/px · z∈[-271,-230]mm · 2 of 306 slices shown, 3 images]
[im 102/306  vessel]
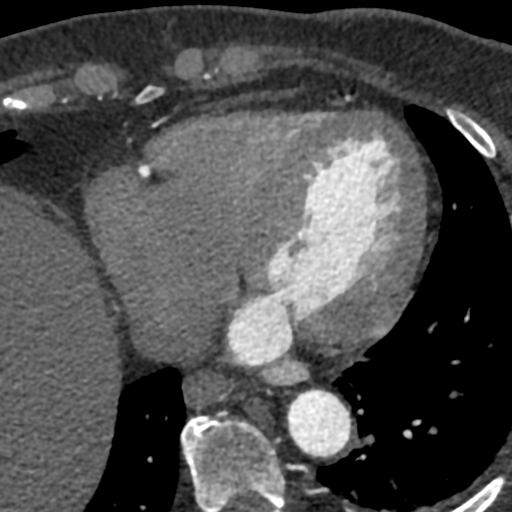
[im 102/306  lung]
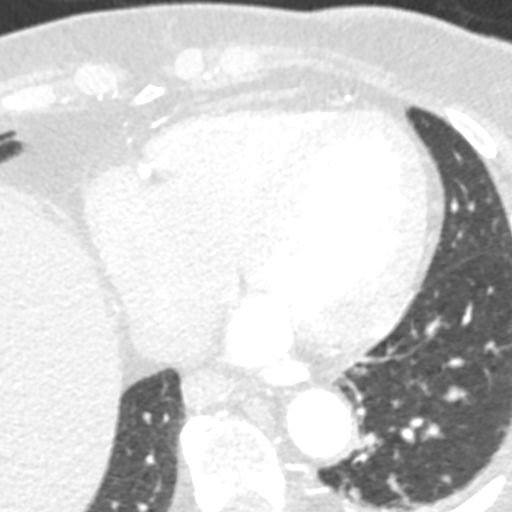
[im 204/306  vessel]
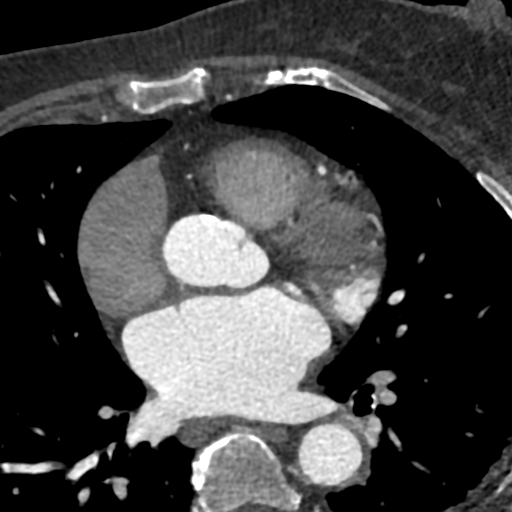

[Series 8: ts diast sharp 72 % · axial · 0.39mm/px · z∈[-271,-230]mm · 2 of 306 slices shown]
[im 102/306  lung]
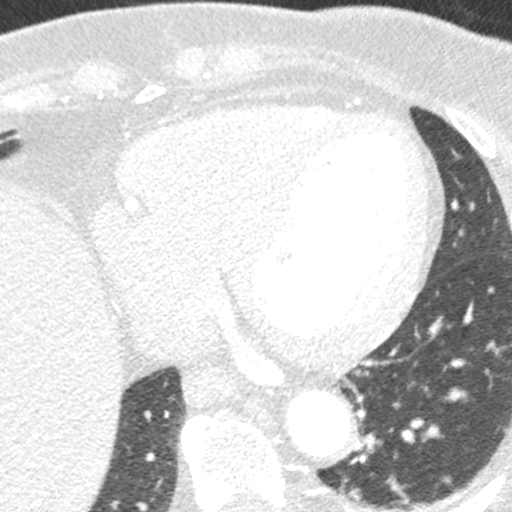
[im 204/306  lung]
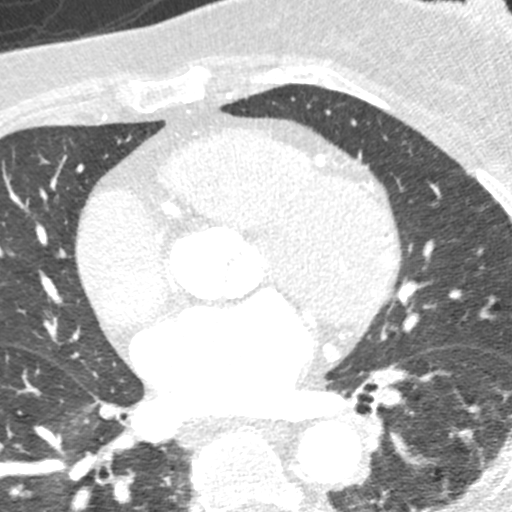

[Series 9: ts syst sharp · axial · 0.39mm/px · z∈[-271,-230]mm · 2 of 306 slices shown]
[im 102/306  lung]
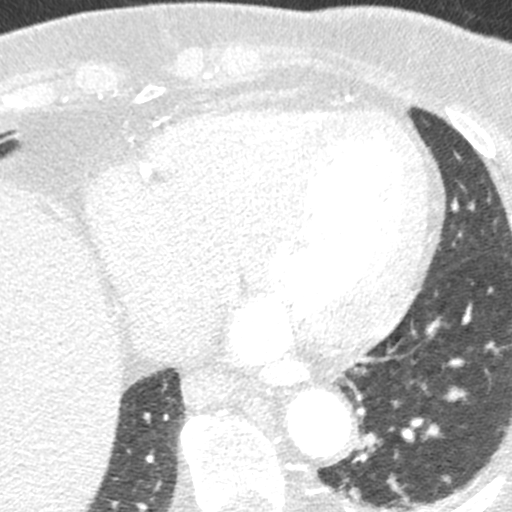
[im 204/306  lung]
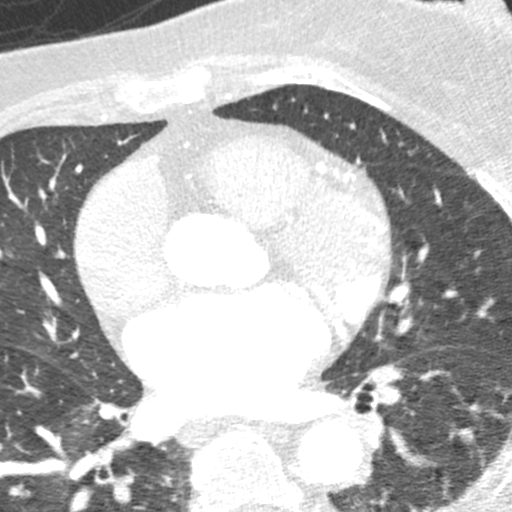

[8 of 20 positions shown; findings below may reference images not displayed]

FINDINGS: Vascular: See dedicated report for cardiovascular details and
coronary artery calcium scoring. Calcified aortic atherosclerosis.

Mediastinum/Nodes: Chest is imaged only through the mid chest to
cover cardiac structures. No adenopathy in the visualized chest.

Lungs/Pleura: Airways are patent, visualized portions of the
airways. Minimal basilar atelectasis. No effusion. No gross
consolidative process.

Upper Abdomen: Mild circumferential thickening of small hiatal
hernia and distal esophagus. Otherwise unremarkable appearance of
upper abdominal contents.

Musculoskeletal: No acute bone finding or destructive bone process.
IMPRESSION: Mild circumferential thickening of small hiatal hernia and distal
esophagus. Correlate with any clinical evidence of esophagitis with
dedicated esophageal evaluation as warranted.

Otherwise no acute or significant extracardiac findings.

Aortic Atherosclerosis (6ZJHC-O29.9).
FINDINGS: Image quality: Excellent.

Noise artifact is: Limited.

Coronary Arteries:  Normal coronary origin.  Right dominance.

Left main: The left main is a large caliber vessel with a normal
take off from the left coronary cusp that bifurcates to form a left
anterior descending artery and a left circumflex artery. There is no
plaque or stenosis.

Left anterior descending artery: The proximal LAD contains minimal
calcified plaque (<25%). The mid and distal segments are patent. The
LAD gives off 2 patent diagonal branches.

Left circumflex artery: The LCX is non-dominant with minimal
non-calcified plaque (<25%). The LCX gives off 3 patent obtuse
marginal branches.

Right coronary artery: The RCA is dominant with normal take off from
the right coronary cusp. The RCA contains minimal calcified plaque
(<25%). The RCA terminates as a PDA and right posterolateral branch
without evidence of plaque or stenosis.

Right Atrium: Right atrial size is within normal limits.

Right Ventricle: The right ventricular cavity is within normal
limits.

Left Atrium: Left atrial size is normal in size with no left atrial
appendage filling defect.

Left Ventricle: The ventricular cavity size is within normal limits.
There are no stigmata of prior infarction. There is no abnormal
filling defect.

Pulmonary arteries: Normal in size without proximal filling defect.

Pulmonary veins: Normal pulmonary venous drainage.

Pericardium: Normal thickness with no significant effusion or
calcium present.

Cardiac valves: The aortic valve is trileaflet without significant
calcification. The mitral valve is normal structure with mild
annular calcium.

Aorta: Normal caliber with no significant disease.

Extra-cardiac findings: See attached radiology report for
non-cardiac structures.
IMPRESSION: 1. Coronary calcium score of 303. This was 85th percentile for age-,
sex, and race-matched controls.

2. Normal coronary origin with right dominance.

3. Non-obstructive CAD (<25%) in the LAD/LCX/RCA.

RECOMMENDATIONS:
1. Minimal non-obstructive CAD (0-24%). Consider non-atherosclerotic
causes of chest pain. Consider preventive therapy and risk factor
modification.

*** End of Addendum ***
EXAM:
OVER-READ INTERPRETATION  CT CHEST

The following report is an over-read performed by radiologist Dr.
over-read does not include interpretation of cardiac or coronary
anatomy or pathology. The coronary calcium score/coronary CTA
interpretation by the cardiologist is attached.
FINDINGS: Vascular: See dedicated report for cardiovascular details and
coronary artery calcium scoring. Calcified aortic atherosclerosis.

Mediastinum/Nodes: Chest is imaged only through the mid chest to
cover cardiac structures. No adenopathy in the visualized chest.

Lungs/Pleura: Airways are patent, visualized portions of the
airways. Minimal basilar atelectasis. No effusion. No gross
consolidative process.

Upper Abdomen: Mild circumferential thickening of small hiatal
hernia and distal esophagus. Otherwise unremarkable appearance of
upper abdominal contents.

Musculoskeletal: No acute bone finding or destructive bone process.
IMPRESSION: Mild circumferential thickening of small hiatal hernia and distal
esophagus. Correlate with any clinical evidence of esophagitis with
dedicated esophageal evaluation as warranted.

Otherwise no acute or significant extracardiac findings.

Aortic Atherosclerosis (6ZJHC-O29.9).

## 2021-07-02 MED ORDER — IVABRADINE HCL 7.5 MG PO TABS: 15.0000 mg | ORAL_TABLET | Freq: Once | ORAL | Status: DC

## 2021-07-02 MED ORDER — DOCUSATE SODIUM 100 MG PO CAPS
100.0000 mg | ORAL_CAPSULE | Freq: Two times a day (BID) | ORAL | Status: DC
  Administered 2021-07-02 (×2): 100 mg via ORAL
  Filled 2021-07-02 (×2): qty 1

## 2021-07-02 MED ORDER — NITROGLYCERIN 0.4 MG SL SUBL
SUBLINGUAL_TABLET | SUBLINGUAL | Status: AC
  Administered 2021-07-02: 0.8 mg via SUBLINGUAL
  Filled 2021-07-02: qty 2

## 2021-07-02 MED ORDER — NITROGLYCERIN 0.4 MG SL SUBL: 0.4000 mg | SUBLINGUAL_TABLET | SUBLINGUAL | Status: DC | PRN

## 2021-07-02 MED ORDER — ACETAMINOPHEN 325 MG PO TABS
650.0000 mg | ORAL_TABLET | Freq: Four times a day (QID) | ORAL | Status: DC | PRN
  Filled 2021-07-02: qty 2

## 2021-07-02 MED ORDER — METOPROLOL TARTRATE 50 MG PO TABS
100.0000 mg | ORAL_TABLET | Freq: Once | ORAL | Status: AC
  Administered 2021-07-02: 100 mg via ORAL
  Filled 2021-07-02: qty 2

## 2021-07-02 MED ORDER — INSULIN ASPART 100 UNIT/ML IJ SOLN
0.0000 [IU] | Freq: Three times a day (TID) | INTRAMUSCULAR | Status: DC
  Administered 2021-07-02: 2 [IU] via SUBCUTANEOUS

## 2021-07-02 MED ORDER — APIXABAN 5 MG PO TABS
5.0000 mg | ORAL_TABLET | Freq: Two times a day (BID) | ORAL | Status: DC
  Administered 2021-07-02 (×3): 5 mg via ORAL
  Filled 2021-07-02 (×3): qty 1

## 2021-07-02 MED ORDER — ASPIRIN EC 81 MG PO TBEC
81.0000 mg | DELAYED_RELEASE_TABLET | Freq: Every day | ORAL | Status: DC
  Administered 2021-07-02: 81 mg via ORAL
  Filled 2021-07-02: qty 1

## 2021-07-02 MED ORDER — PANTOPRAZOLE SODIUM 40 MG PO TBEC
40.0000 mg | DELAYED_RELEASE_TABLET | Freq: Every day | ORAL | Status: DC
  Administered 2021-07-02: 40 mg via ORAL
  Filled 2021-07-02: qty 1

## 2021-07-02 MED ORDER — ROSUVASTATIN CALCIUM 5 MG PO TABS
10.0000 mg | ORAL_TABLET | Freq: Every day | ORAL | Status: DC
  Administered 2021-07-02: 10 mg via ORAL
  Filled 2021-07-02: qty 2

## 2021-07-02 MED ORDER — DULOXETINE HCL 20 MG PO CPEP
20.0000 mg | ORAL_CAPSULE | Freq: Every morning | ORAL | Status: DC
  Administered 2021-07-02: 20 mg via ORAL
  Filled 2021-07-02 (×2): qty 1

## 2021-07-02 MED ORDER — IOHEXOL 350 MG/ML SOLN
95.0000 mL | Freq: Once | INTRAVENOUS | Status: AC | PRN
  Administered 2021-07-02: 95 mL via INTRAVENOUS

## 2021-07-02 MED ORDER — AMLODIPINE BESYLATE 5 MG PO TABS
5.0000 mg | ORAL_TABLET | Freq: Every day | ORAL | Status: DC
  Administered 2021-07-02: 5 mg via ORAL
  Filled 2021-07-02: qty 1

## 2021-07-02 MED ORDER — GABAPENTIN 300 MG PO CAPS
300.0000 mg | ORAL_CAPSULE | Freq: Every day | ORAL | Status: DC
  Administered 2021-07-02: 300 mg via ORAL
  Filled 2021-07-02: qty 1

## 2021-07-02 MED ORDER — SENNOSIDES-DOCUSATE SODIUM 8.6-50 MG PO TABS
1.0000 | ORAL_TABLET | Freq: Two times a day (BID) | ORAL | Status: DC
  Filled 2021-07-02: qty 1

## 2021-07-02 MED ORDER — SIMETHICONE 80 MG PO CHEW
80.0000 mg | CHEWABLE_TABLET | Freq: Four times a day (QID) | ORAL | Status: DC
  Administered 2021-07-02 (×3): 80 mg via ORAL
  Filled 2021-07-02 (×6): qty 1

## 2021-07-02 MED ORDER — POLYETHYLENE GLYCOL 3350 17 G PO PACK
17.0000 g | PACK | Freq: Two times a day (BID) | ORAL | Status: DC
  Administered 2021-07-02: 17 g via ORAL
  Filled 2021-07-02: qty 1

## 2021-07-02 MED ORDER — LOSARTAN POTASSIUM 50 MG PO TABS
50.0000 mg | ORAL_TABLET | Freq: Every day | ORAL | Status: DC
  Administered 2021-07-02: 50 mg via ORAL
  Filled 2021-07-02: qty 1

## 2021-07-02 MED ORDER — NITROGLYCERIN 0.4 MG SL SUBL: 0.8000 mg | SUBLINGUAL_TABLET | Freq: Once | SUBLINGUAL | Status: AC

## 2021-07-02 MED ORDER — FOLIC ACID 1 MG PO TABS
1.0000 mg | ORAL_TABLET | Freq: Every day | ORAL | Status: DC
  Administered 2021-07-02: 1 mg via ORAL
  Filled 2021-07-02: qty 1

## 2021-07-02 MED ORDER — IVABRADINE HCL 7.5 MG PO TABS
15.0000 mg | ORAL_TABLET | Freq: Once | ORAL | Status: AC
  Administered 2021-07-02: 15 mg via ORAL
  Filled 2021-07-02 (×4): qty 2

## 2021-07-02 NOTE — Progress Notes (Signed)
Patient denied any chest pain this AM. OOB in chair at this time. No other distress voice or noted. POC reviewed with pt. Will cont to monitor.

## 2021-07-02 NOTE — Progress Notes (Signed)
Spoke to Michael in CT, CT is back up at this time and staff will call for her as appropriate.
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
## 2021-07-02 NOTE — Progress Notes (Signed)
Patient needs an 18G in [facility] for CT angio per cardiology. Stat meds requested from RX.  RN at bedside attempt to start PIV, unsuccessful. IV team ordered.

## 2021-07-02 NOTE — Progress Notes (Signed)
  Echocardiogram 2D Echocardiogram has been performed.  Merrie Roof F 07/02/2021, 3:57 PM

## 2021-07-02 NOTE — Plan of Care (Signed)
  Problem: Education: Goal: Knowledge of General Education information will improve Description Including pain rating scale, medication(s)/side effects and non-pharmacologic comfort measures Outcome: Progressing   

## 2021-07-02 NOTE — Progress Notes (Signed)
PROGRESS NOTE  Brief Narrative: Diane [name] is a 75 y.o. female with a history of NIDT2DM (HbA1c 7.8%), HTN, recent admissions for SBO, LLE DVT s/p mechanical thrombectomy x2 that presented to the ED from home with chest pain for the past couple days occurring at times at rest, but associated with exertional dyspnea, improved with NTG in ED. Cardiac enzymes were reassuring, ECG with nonspecific ST changes. CTA chest revealed no PE or dissection, no significant pericardial effusion, +LAD and LCx calcifications. Cardiology was consulted, recommending coronary CT which is pending.  Subjective: Tired of being in the hospital, has had 5 admissions this year. No left leg pain reported, no bleeding. Reports chest pain is resolved. No current dyspnea but that has been exertional.   Objective: BP (!) 144/74 (BP Location: Right Arm)   Pulse 70   Temp 98.4 F (36.9 C) (Oral)   Resp 18   Ht '5\' 8"'  (1.727 m)   Wt 93.1 kg   SpO2 100%   BMI 31.20 kg/m   Gen: Nontoxic Pulm: Clear and nonlabored on room air  CV: RRR, +systolic murmur, no JVD, no edema GI: Soft, NT, ND, +BS  Neuro: Alert and oriented. No focal deficits. Skin: No rashes, lesions or ulcers on visualized skin  Assessment & Plan: Principal Problem:   Chest pain Active Problems:   Hypertension   Type 2 diabetes mellitus with diabetic neuropathy, unspecified (HCC)  Chest pain with typical and atypical features: She had normal coronaries at [facility] in June 2018 after a suspected false positive stress test. Acute work up not consistent with ACS.  - Cardiology consulted, recommending coronary CT which is ordered.  - Echocardiogram pending.  - BNP, ESR, CRP, TSH to complete work up with DDx including pericarditis.  - Continue ASA (which was probably previously due to diabetes), statin. Hold ARB with impending contrast load.   History of DVT involving distal IVC extending throughout left leg s/p thrombectomy on 02/06/2021 by Dr. Oneida Alar and  recurrence requiring repeat thrombectomy with balloon angioplasty 8/1 by Dr. Donzetta Matters:  - Continue eliquis.  - Pt has follow up imaging and office visit already scheduled with vascular surgery for 9/16.   NIDT2DM: Reports of hypoglycemia during last admission 7/30 - 8/5 - Continue sensitive SSI, hold metformin with contrast.   History of pSBO: None currently noted clinically. Admitted and improved with conservative measures on 3/24 - 4/1 and 7/22/- 7/27. Also severely constipated during admission 7/30 - 8/5 - Avoid antimotility agents, encourage OOB - Bowel regimen ordered   RA:  - MTX weekly dose will be due Sunday. Continue folate.  Patrecia Pour, MD Pager on amion 07/02/2021, 3:12 PM
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
## 2021-07-03 DIAGNOSIS — I10 Essential (primary) hypertension: Secondary | ICD-10-CM | POA: Diagnosis not present

## 2021-07-03 DIAGNOSIS — E1140 Type 2 diabetes mellitus with diabetic neuropathy, unspecified: Secondary | ICD-10-CM

## 2021-07-03 DIAGNOSIS — R072 Precordial pain: Secondary | ICD-10-CM | POA: Diagnosis not present

## 2021-07-03 LAB — LIPID PANEL
Cholesterol: 100 mg/dL (ref 0–200)
HDL: 44 mg/dL (ref >40)
LDL Cholesterol: 45 mg/dL (ref 0–99)
Total CHOL/HDL Ratio: 2.3 RATIO
Triglycerides: 56 mg/dL (ref <150)
VLDL: 11 mg/dL (ref 0–40)

## 2021-07-03 LAB — GLUCOSE, CAPILLARY: Glucose-Capillary: 101 mg/dL — ABNORMAL HIGH (ref 70–99)

## 2021-07-03 MED ORDER — PANTOPRAZOLE SODIUM 40 MG PO TBEC: 40.0000 mg | DELAYED_RELEASE_TABLET | Freq: Every day | ORAL | 0 refills | Status: DC

## 2021-07-03 NOTE — Plan of Care (Signed)
  Problem: Education: Goal: Knowledge of General Education information will improve Description: Including pain rating scale, medication(s)/side effects and non-pharmacologic comfort measures Outcome: Adequate for Discharge   

## 2021-07-03 NOTE — Discharge Summary (Signed)
Physician Discharge Summary  Diane [name] XTA:[id number] DOB: [birth year] DOA: 07/01/2021  PCP: Diane Seashore, MD  Admit date: 07/01/2021 Discharge date: 07/03/2021  Admitted From: Home Disposition: Home   Recommendations for Outpatient Follow-up:  Follow up with PCP in 1-2 weeks, consider GI evaluation for etiology of discomfort. Started PPI.   Home Health: None Equipment/Devices: None Discharge Condition: Stable CODE STATUS: Full Diet recommendation: Heart healthy, carb-modified  Brief/Interim Summary: Diane [name] is a 75 y.o. female with a history of NIDT2DM (HbA1c 7.8%), HTN, recent admissions for SBO, LLE DVT s/p mechanical thrombectomy x2 that presented to the ED from home with chest pain for the past couple days occurring at times at rest, but associated with exertional dyspnea, improved with NTG in ED. Cardiac enzymes were reassuring, ECG with nonspecific ST changes. CTA chest revealed no PE or dissection, no significant pericardial effusion, +LAD and LCx calcifications. Cardiology was consulted, and after reassuring coronary CTA, recommended discharge.   Discharge Diagnoses:  Principal Problem:   Chest pain Active Problems:   Hypertension   Type 2 diabetes mellitus with diabetic neuropathy, unspecified (HCC)  Chest pain with typical and atypical features: She had normal coronaries at [facility] in June 2018 after a suspected false positive stress test. Acute work up not consistent with ACS.  - Echocardiogram unremarkable, no significant coronary disease on coronary CTA.  - BNP, ESR, CRP, TSH all reassuring - Continue ASA (which was probably previously due to diabetes), statin. LDL at goal.  - Exertional dyspnea is more likely attributable to deconditioning due to frequent hospitalizations, exercise program recommended. - Trial PPI.   History of DVT involving distal IVC extending throughout left leg s/p thrombectomy on 02/06/2021 by Dr. Oneida Alar and recurrence requiring repeat  thrombectomy with balloon angioplasty 8/1 by Dr. Donzetta Matters:  - Continue eliquis.  - Pt has follow up imaging and office visit already scheduled with vascular surgery for 9/16.    NIDT2DM: Reports of hypoglycemia during last admission 7/30 - 8/5 - Ok to restart home meds at discharge.   History of pSBO: None currently noted clinically. Admitted and improved with conservative measures on 3/24 - 4/1 and 7/22/- 7/27. Also severely constipated during admission 7/30 - 8/5 - Avoid antimotility agents, encourage OOB - Bowel regimen to continue.   RA:  - MTX weekly dose will be due Sunday. Continue folate.  Obesity: Body mass index is 31.2 kg/m.  Discharge Instructions Discharge Instructions     Diet - low sodium heart healthy   Complete by: As directed    Diet Carb Modified   Complete by: As directed    Discharge instructions   Complete by: As directed    Continue taking eliquis, aspirin, and crestor. The work up here revealed no significant problems with your heart so the symptoms are possibly related to gastric and esophagitis inflammation. You should take protonix daily to reduce acid in the stomach which may help that symptom. You need to continue a graded exercise program to help with shortness of breath with exertion. If your symptoms worsen, seek medical attention right away. Otherwise, follow up with your PCP in 1-2 weeks.      Allergies as of 07/03/2021       Reactions   Cephalexin Other (See Comments)   URINARY RETENTION = "blocked my urine"   Tapentadol    Other reaction(s): zombie Other reaction(s): zombie   Codeine Hives, Rash   Nucynta [tapentadol Hcl] Other (See Comments)   Altered mental status  Medication List     TAKE these medications    amLODipine 5 MG tablet Commonly known as: NORVASC Take 5 mg by mouth daily.   apixaban 5 MG Tabs tablet Commonly known as: ELIQUIS Take 5 mg by mouth 2 (two) times daily. What changed: Another medication with the same  name was removed. Continue taking this medication, and follow the directions you see here.   Aspirin Low Dose 81 MG EC tablet Generic drug: aspirin Take 1 tablet (81 mg total) by mouth daily. Swallow whole.   docusate sodium 100 MG capsule Commonly known as: COLACE Take 1 capsule (100 mg total) by mouth 2 (two) times daily.   DULoxetine 20 MG capsule Commonly known as: CYMBALTA Take 20 mg by mouth every morning.   folic acid 1 MG tablet Commonly known as: FOLVITE Take 1 tablet (1 mg total) by mouth daily.   gabapentin 300 MG capsule Commonly known as: NEURONTIN Take 300 mg by mouth daily.   losartan 50 MG tablet Commonly known as: COZAAR Take 1 tablet (50 mg total) by mouth daily.   metFORMIN 500 MG 24 hr tablet Commonly known as: GLUCOPHAGE-XR Take 1,000 mg by mouth daily with breakfast.   methotrexate 2.5 MG tablet Commonly known as: RHEUMATREX Take 8 tablets (20 mg total) by mouth once a week. 8 tablets weekly (Sunday)   naproxen sodium 220 MG tablet Commonly known as: ALEVE Take 440 mg by mouth daily as needed (pain).   pantoprazole 40 MG tablet Commonly known as: PROTONIX Take 1 tablet (40 mg total) by mouth daily.   polyethylene glycol 17 g packet Commonly known as: MIRALAX / GLYCOLAX Take 17 g by mouth 2 (two) times daily.   rosuvastatin 10 MG tablet Commonly known as: CRESTOR Take 1 tablet (10 mg total) by mouth daily.   simethicone 80 MG chewable tablet Commonly known as: MYLICON Chew 1 tablet (80 mg total) by mouth 4 (four) times daily.   VITAMIN C ADULT GUMMIES PO Take 2 tablets by mouth daily.        Follow-up Information     Diane Seashore, MD Follow up.   Specialty: Internal Medicine Contact information: [address] [place] [address] [phone]                Allergies  Allergen Reactions   Cephalexin Other (See Comments)    URINARY RETENTION = "blocked my urine"   Tapentadol     Other  reaction(s): zombie Other reaction(s): zombie   Codeine Hives and Rash   Nucynta [Tapentadol Hcl] Other (See Comments)    Altered mental status    Consultations: Cardiology  Procedures/Studies: DG Chest 2 View  Result Date: 07/01/2021 CLINICAL DATA:  Chest pain EXAM: CHEST - 2 VIEW COMPARISON:  Chest radiograph 05/30/2021 FINDINGS: The cardiomediastinal silhouette is within normal limits. There is no focal consolidation or pulmonary edema. There is no pleural effusion or pneumothorax. Right shoulder arthroplasty hardware is again noted. There is no acute osseous abnormality. IMPRESSION: No radiographic evidence of acute cardiopulmonary process. Electronically Signed   By: Valetta Mole M.D.   On: 07/01/2021 16:38   DG Abd 1 View  Result Date: 06/03/2021 CLINICAL DATA:  Abdominal distension EXAM: ABDOMEN - 1 VIEW COMPARISON:  05/27/2021 and overlapping portion of chest CT from 05/31/2021 FINDINGS: Gas and stool are present in the colon. No dilated bowel. Left common iliac vein stent. Cholecystectomy clips noted. Probable vascular calcification to the left of the L4 vertebral body based  on comparison prior CT scan 05/22/2021. IMPRESSION: 1. No dilated bowel or specific bowel gas abnormality observed. No appreciable abnormality to account for the reported abdominal distension. Electronically Signed   By: Van Clines M.D.   On: 06/03/2021 17:12   CT Angio Chest PE W/Cm &/Or Wo Cm  Result Date: 07/01/2021 CLINICAL DATA:  Chest pain.  PE suspected, high prob EXAM: CT ANGIOGRAPHY CHEST WITH CONTRAST TECHNIQUE: Multidetector CT imaging of the chest was performed using the standard protocol during bolus administration of intravenous contrast. Multiplanar CT image reconstructions and MIPs were obtained to evaluate the vascular anatomy. Repeat intravenous injection was administered due to poor timing of intravenous contrast on the first images. CONTRAST:  165m OMNIPAQUE IOHEXOL 350 MG/ML SOLN  COMPARISON:  CT angiography chest 05/31/2021 FINDINGS: Cardiovascular: Satisfactory opacification of the pulmonary arteries to the segmental level. No evidence of pulmonary embolism. Normal heart size. No significant pericardial effusion. The thoracic aorta is normal in caliber. Mild atherosclerotic plaque of the thoracic aorta. Likely three-vessel coronary artery calcifications. Tiny hiatal hernia. Mediastinum/Nodes: No enlarged mediastinal, hilar, or axillary lymph nodes. Thyroid gland, trachea, and esophagus demonstrate no significant findings. Lungs/Pleura: No focal consolidation. No pulmonary nodule. No pulmonary mass. No pleural effusion. No pneumothorax. Upper Abdomen: No acute abnormality. Musculoskeletal: No chest wall abnormality.  Bilateral gynecomastia. No suspicious lytic or blastic osseous lesions. No acute displaced fracture. Review of the MIP images confirms the above findings. IMPRESSION: 1. No pulmonary embolus. 2. No acute intrapulmonary abnormality. 3. Tiny hiatal hernia. 4. Aortic Atherosclerosis (ICD10-I70.0) including three-vessel coronary artery calcifications. Electronically Signed   By: MIven FinnM.D.   On: 07/01/2021 21:29   CT CORONARY MORPH W/CTA COR W/SCORE W/CA W/CM &/OR WO/CM  Addendum Date: 07/02/2021   ADDENDUM REPORT: 07/02/2021 18:10 CLINICAL DATA:  Chest pain EXAM: Cardiac/Coronary CTA TECHNIQUE: A non-contrast, gated CT scan was obtained with axial slices of 3 mm through the heart for calcium scoring. Calcium scoring was performed using the Agatston method. A 120 kV prospective, gated, contrast cardiac scan was obtained. Gantry rotation speed was 250 msecs and collimation was 0.6 mm. Two sublingual nitroglycerin tablets (0.8 mg) were given. The 3D data set was reconstructed in 5% intervals of the 35-75% of the R-R cycle. Diastolic phases were analyzed on a dedicated workstation using MPR, MIP, and VRT modes. The patient received 95 cc of contrast. FINDINGS: Image  quality: Excellent. Noise artifact is: Limited. Coronary Arteries:  Normal coronary origin.  Right dominance. Left main: The left main is a large caliber vessel with a normal take off from the left coronary cusp that bifurcates to form a left anterior descending artery and a left circumflex artery. There is no plaque or stenosis. Left anterior descending artery: The proximal LAD contains minimal calcified plaque (<25%). The mid and distal segments are patent. The LAD gives off 2 patent diagonal branches. Left circumflex artery: The LCX is non-dominant with minimal non-calcified plaque (<25%). The LCX gives off 3 patent obtuse marginal branches. Right coronary artery: The RCA is dominant with normal take off from the right coronary cusp. The RCA contains minimal calcified plaque (<25%). The RCA terminates as a PDA and right posterolateral branch without evidence of plaque or stenosis. Right Atrium: Right atrial size is within normal limits. Right Ventricle: The right ventricular cavity is within normal limits. Left Atrium: Left atrial size is normal in size with no left atrial appendage filling defect. Left Ventricle: The ventricular cavity size is within normal limits. There are no  stigmata of prior infarction. There is no abnormal filling defect. Pulmonary arteries: Normal in size without proximal filling defect. Pulmonary veins: Normal pulmonary venous drainage. Pericardium: Normal thickness with no significant effusion or calcium present. Cardiac valves: The aortic valve is trileaflet without significant calcification. The mitral valve is normal structure with mild annular calcium. Aorta: Normal caliber with no significant disease. Extra-cardiac findings: See attached radiology report for non-cardiac structures. IMPRESSION: 1. Coronary calcium score of 303. This was 85th percentile for age-, sex, and race-matched controls. 2. Normal coronary origin with right dominance. 3. Non-obstructive CAD (<25%) in the  LAD/LCX/RCA. RECOMMENDATIONS: 1. Minimal non-obstructive CAD (0-24%). Consider non-atherosclerotic causes of chest pain. Consider preventive therapy and risk factor modification. Eleonore Chiquito, MD Electronically Signed   By: Eleonore Chiquito M.D.   On: 07/02/2021 18:10   Result Date: 07/02/2021 EXAM: OVER-READ INTERPRETATION  CT CHEST The following report is an over-read performed by radiologist Dr. Zetta Bills of [facility], [place] on 07/02/2021. This over-read does not include interpretation of cardiac or coronary anatomy or pathology. The coronary calcium score/coronary CTA interpretation by the cardiologist is attached. COMPARISON:  CT angiography of the chest dated 08/31 of 2022. FINDINGS: Vascular: See dedicated report for cardiovascular details and coronary artery calcium scoring. Calcified aortic atherosclerosis. Mediastinum/Nodes: Chest is imaged only through the mid chest to cover cardiac structures. No adenopathy in the visualized chest. Lungs/Pleura: Airways are patent, visualized portions of the airways. Minimal basilar atelectasis. No effusion. No gross consolidative process. Upper Abdomen: Mild circumferential thickening of small hiatal hernia and distal esophagus. Otherwise unremarkable appearance of upper abdominal contents. Musculoskeletal: No acute bone finding or destructive bone process. IMPRESSION: Mild circumferential thickening of small hiatal hernia and distal esophagus. Correlate with any clinical evidence of esophagitis with dedicated esophageal evaluation as warranted. Otherwise no acute or significant extracardiac findings. Aortic Atherosclerosis (ICD10-I70.0). Electronically Signed: By: Zetta Bills M.D. On: 07/02/2021 17:17   ECHOCARDIOGRAM COMPLETE  Result Date: 07/02/2021    ECHOCARDIOGRAM REPORT   Patient Name:   Diane KERRIANN KAMPHUIS Date of Exam: 07/02/2021 Medical Rec #:  [id number]      Height:       68.0 in Accession #:    [id number]     Weight:       205.2 lb Date of Birth:   1946/02/11      BSA:          2.066 m Patient Age:    26 years       BP:           144/74 mmHg Patient Gender: F              HR:           69 bpm. Exam Location:  Inpatient Procedure: 2D Echo, Cardiac Doppler and Color Doppler Indications:    Dyspnea R06.00  History:        Patient has no prior history of Echocardiogram examinations.  Sonographer:    Merrie Roof RDCS Referring Phys: [id number] [place]  1. Left ventricular ejection fraction, by estimation, is 60 to 65%. The left ventricle has normal function. The left ventricle has no regional wall motion abnormalities. There is mild left ventricular hypertrophy. Left ventricular diastolic parameters are consistent with Grade I diastolic dysfunction (impaired relaxation).  2. Right ventricular systolic function is normal. The right ventricular size is normal. There is normal pulmonary artery systolic pressure. The estimated right ventricular systolic pressure is 49.4 mmHg.  3. Left atrial  size was moderately dilated.  4. The mitral valve is normal in structure. Mild mitral valve regurgitation. No evidence of mitral stenosis.  5. The aortic valve is tricuspid. Aortic valve regurgitation is not visualized. Mild aortic valve sclerosis is present, with no evidence of aortic valve stenosis.  6. The inferior vena cava is normal in size with greater than 50% respiratory variability, suggesting right atrial pressure of 3 mmHg. FINDINGS  Left Ventricle: Left ventricular ejection fraction, by estimation, is 60 to 65%. The left ventricle has normal function. The left ventricle has no regional wall motion abnormalities. The left ventricular internal cavity size was normal in size. There is  mild left ventricular hypertrophy. Left ventricular diastolic parameters are consistent with Grade I diastolic dysfunction (impaired relaxation). Right Ventricle: The right ventricular size is normal. No increase in right ventricular wall thickness. Right ventricular  systolic function is normal. There is normal pulmonary artery systolic pressure. The tricuspid regurgitant velocity is 2.38 m/s, and  with an assumed right atrial pressure of 3 mmHg, the estimated right ventricular systolic pressure is 62.2 mmHg. Left Atrium: Left atrial size was moderately dilated. Right Atrium: Right atrial size was normal in size. Pericardium: There is no evidence of pericardial effusion. Mitral Valve: The mitral valve is normal in structure. Mild mitral annular calcification. Mild mitral valve regurgitation. No evidence of mitral valve stenosis. Tricuspid Valve: The tricuspid valve is normal in structure. Tricuspid valve regurgitation is trivial. No evidence of tricuspid stenosis. Aortic Valve: The aortic valve is tricuspid. Aortic valve regurgitation is not visualized. Mild aortic valve sclerosis is present, with no evidence of aortic valve stenosis. Aortic valve mean gradient measures 7.0 mmHg. Aortic valve peak gradient measures 12.4 mmHg. Aortic valve area, by VTI measures 1.21 cm. Pulmonic Valve: The pulmonic valve was normal in structure. Pulmonic valve regurgitation is not visualized. No evidence of pulmonic stenosis. Aorta: The aortic root is normal in size and structure. Venous: The inferior vena cava is normal in size with greater than 50% respiratory variability, suggesting right atrial pressure of 3 mmHg. IAS/Shunts: No atrial level shunt detected by color flow Doppler.  LEFT VENTRICLE PLAX 2D LVIDd:         4.80 cm  Diastology LVIDs:         3.30 cm  LV e' medial:    7.94 cm/s LV PW:         1.20 cm  LV E/e' medial:  11.4 LV IVS:        1.20 cm  LV e' lateral:   7.51 cm/s LVOT diam:     1.80 cm  LV E/e' lateral: 12.1 LV SV:         42 LV SV Index:   20 LVOT Area:     2.54 cm  LEFT ATRIUM             Index       RIGHT ATRIUM           Index LA diam:        3.80 cm 1.84 cm/m  RA Area:     12.90 cm LA Vol (A2C):   52.4 ml 25.36 ml/m RA Volume:   29.10 ml  14.08 ml/m LA Vol (A4C):    87.5 ml 42.35 ml/m LA Biplane Vol: 68.4 ml 33.10 ml/m  AORTIC VALVE AV Area (Vmax):    1.14 cm AV Area (Vmean):   1.05 cm AV Area (VTI):     1.21 cm AV Vmax:  176.00 cm/s AV Vmean:          122.000 cm/s AV VTI:            0.350 m AV Peak Grad:      12.4 mmHg AV Mean Grad:      7.0 mmHg LVOT Vmax:         79.00 cm/s LVOT Vmean:        50.400 cm/s LVOT VTI:          0.166 m LVOT/AV VTI ratio: 0.47  AORTA Ao Root diam: 3.10 cm MITRAL VALVE               TRICUSPID VALVE MV Area (PHT): 3.99 cm    TR Peak grad:   22.7 mmHg MV Decel Time: 190 msec    TR Vmax:        238.00 cm/s MV E velocity: 90.80 cm/s MV A velocity: 95.10 cm/s  SHUNTS MV E/A ratio:  0.95        Systemic VTI:  0.17 m                            Systemic Diam: 1.80 cm Candee Furbish MD Electronically signed by Candee Furbish MD Signature Date/Time: 07/02/2021/4:05:03 PM    Final      Subjective: All symptoms have resolved. No chest pain. No abd pain, N/V.  Discharge Exam: Vitals:   07/03/21 0022 07/03/21 0509  BP: (!) 148/72 (!) 153/74  Pulse: 69 72  Resp: 18 18  Temp: 98.4 F (36.9 C) 98.3 F (36.8 C)  SpO2: 97% 96%   General: Pt is alert, awake, not in acute distress Cardiovascular: RRR, S1/S2 +, no rubs, no gallops Respiratory: CTA bilaterally, no wheezing, no rhonchi Abdominal: Soft, NT, ND, bowel sounds + Extremities: No edema, no cyanosis  Labs: BNP (last 3 results) Recent Labs    07/02/21 1255  BNP 03.5   Basic Metabolic Panel: Recent Labs  Lab 07/01/21 1557 07/02/21 0347  NA 141 140  K 4.0 3.3*  CL 107 106  CO2 27 27  GLUCOSE 100* 117*  BUN 10 7*  CREATININE 0.59 0.52  CALCIUM 9.5 9.2   Liver Function Tests: Recent Labs  Lab 07/01/21 1557  AST 16  ALT 12  ALKPHOS 52  BILITOT 0.6  PROT 7.2  ALBUMIN 3.7   No results for input(s): LIPASE, AMYLASE in the last 168 hours. No results for input(s): AMMONIA in the last 168 hours. CBC: Recent Labs  Lab 07/01/21 1557 07/02/21 0347  WBC 6.5  6.0  NEUTROABS 3.9  --   HGB 10.2* 9.5*  HCT 33.7* 31.2*  MCV 84.9 84.3  PLT 249 252   Cardiac Enzymes: No results for input(s): CKTOTAL, CKMB, CKMBINDEX, TROPONINI in the last 168 hours. BNP: Invalid input(s): POCBNP CBG: Recent Labs  Lab 07/02/21 0609 07/02/21 1129 07/02/21 2134 07/03/21 0611  GLUCAP 119* 197* 131* 101*   D-Dimer No results for input(s): DDIMER in the last 72 hours. Hgb A1c No results for input(s): HGBA1C in the last 72 hours. Lipid Profile Recent Labs    07/03/21 0338  CHOL 100  HDL 44  LDLCALC 45  TRIG 56  CHOLHDL 2.3   Thyroid function studies Recent Labs    07/02/21 1255  TSH 1.817   Anemia work up No results for input(s): VITAMINB12, FOLATE, FERRITIN, TIBC, IRON, RETICCTPCT in the last 72 hours. Urinalysis    Component Value Date/Time  COLORURINE YELLOW 01/24/2021 0805   APPEARANCEUR CLEAR 01/24/2021 0805   LABSPEC 1.024 01/24/2021 0805   PHURINE 5.0 01/24/2021 0805   GLUCOSEU NEGATIVE 01/24/2021 0805   HGBUR NEGATIVE 01/24/2021 0805   BILIRUBINUR NEGATIVE 01/24/2021 0805   KETONESUR NEGATIVE 01/24/2021 0805   PROTEINUR NEGATIVE 01/24/2021 0805   UROBILINOGEN 0.2 06/15/2014 0524   NITRITE NEGATIVE 01/24/2021 0805   LEUKOCYTESUR NEGATIVE 01/24/2021 0805    Microbiology Recent Results (from the past 240 hour(s))  Resp Panel by RT-PCR (Flu A&B, Covid) Nasopharyngeal Swab     Status: None   Collection Time: 07/01/21  6:43 PM   Specimen: Nasopharyngeal Swab; Nasopharyngeal(NP) swabs in vial transport medium  Result Value Ref Range Status   SARS Coronavirus 2 by RT PCR NEGATIVE NEGATIVE Final    Comment: (NOTE) SARS-CoV-2 target nucleic acids are NOT DETECTED.  The SARS-CoV-2 RNA is generally detectable in upper respiratory specimens during the acute phase of infection. The lowest concentration of SARS-CoV-2 viral copies this assay can detect is 138 copies/mL. A negative result does not preclude SARS-Cov-2 infection and  should not be used as the sole basis for treatment or other patient management decisions. A negative result may occur with  improper specimen collection/handling, submission of specimen other than nasopharyngeal swab, presence of viral mutation(s) within the areas targeted by this assay, and inadequate number of viral copies(<138 copies/mL). A negative result must be combined with clinical observations, patient history, and epidemiological information. The expected result is Negative.  Fact Sheet for Patients:  EntrepreneurPulse.com.au  Fact Sheet for Healthcare Providers:  IncredibleEmployment.be  This test is no t yet approved or cleared by the [place] FDA and  has been authorized for detection and/or diagnosis of SARS-CoV-2 by FDA under an Emergency Use Authorization (EUA). This EUA will remain  in effect (meaning this test can be used) for the duration of the COVID-19 declaration under Section 564(b)(1) of the Act, 21 U.S.C.section 360bbb-3(b)(1), unless the authorization is terminated  or revoked sooner.       Influenza A by PCR NEGATIVE NEGATIVE Final   Influenza B by PCR NEGATIVE NEGATIVE Final    Comment: (NOTE) The Xpert Xpress SARS-CoV-2/FLU/RSV plus assay is intended as an aid in the diagnosis of influenza from Nasopharyngeal swab specimens and should not be used as a sole basis for treatment. Nasal washings and aspirates are unacceptable for Xpert Xpress SARS-CoV-2/FLU/RSV testing.  Fact Sheet for Patients: EntrepreneurPulse.com.au  Fact Sheet for Healthcare Providers: IncredibleEmployment.be  This test is not yet approved or cleared by the [place] FDA and has been authorized for detection and/or diagnosis of SARS-CoV-2 by FDA under an Emergency Use Authorization (EUA). This EUA will remain in effect (meaning this test can be used) for the duration of the COVID-19 declaration  under Section 564(b)(1) of the Act, 21 U.S.C. section 360bbb-3(b)(1), unless the authorization is terminated or revoked.  Performed at [facility], [place] [address] [place], [place] [address]     Time coordinating discharge: Approximately 40 minutes  Patrecia Pour, MD  [facility] 07/03/2021, 9:26 AM

## 2021-07-03 NOTE — Progress Notes (Signed)
Cardiology Progress Note  Patient ID: Diane [name] MRN: [id number] DOB: December 13, 1945 Date of Encounter: 07/03/2021  Primary Cardiologist: None  Subjective   Chief Complaint: None.  HPI: Echo normal.  Coronary CTA with nonobstructive disease.  Plans for discharge today.  ROS:  All other ROS reviewed and negative. Pertinent positives noted in the HPI.     Inpatient Medications  Scheduled Meds:  amLODipine  5 mg Oral Daily   apixaban  5 mg Oral BID   aspirin EC  81 mg Oral Daily   DULoxetine  20 mg Oral q morning   folic acid  1 mg Oral Daily   gabapentin  300 mg Oral Daily   insulin aspart  0-9 Units Subcutaneous TID WC   pantoprazole  40 mg Oral Daily   polyethylene glycol  17 g Oral BID   rosuvastatin  10 mg Oral Daily   senna-docusate  1 tablet Oral BID   simethicone  80 mg Oral QID   Continuous Infusions:  PRN Meds: acetaminophen, nitroGLYCERIN   Vital Signs   Vitals:   07/02/21 1420 07/02/21 2030 07/03/21 0022 07/03/21 0509  BP:  133/67 (!) 148/72 (!) 153/74  Pulse: 70 68 69 72  Resp:  '18 18 18  '$ Temp:  98.6 F (37 C) 98.4 F (36.9 C) 98.3 F (36.8 C)  TempSrc:  Oral Oral Oral  SpO2:  100% 97% 96%  Weight:      Height:        Intake/Output Summary (Last 24 hours) at 07/03/2021 0941 Last data filed at 07/02/2021 2100 Gross per 24 hour  Intake 480 ml  Output --  Net 480 ml   Last 3 Weights 07/02/2021 07/01/2021 06/03/2021  Weight (lbs) 205 lb 3.2 oz 206 lb 212 lb 8.4 oz  Weight (kg) 93.078 kg 93.441 kg 96.4 kg      Telemetry  Overnight telemetry shows sinus rhythm in the 70s, which I personally reviewed.   Physical Exam   Vitals:   07/02/21 1420 07/02/21 2030 07/03/21 0022 07/03/21 0509  BP:  133/67 (!) 148/72 (!) 153/74  Pulse: 70 68 69 72  Resp:  '18 18 18  '$ Temp:  98.6 F (37 C) 98.4 F (36.9 C) 98.3 F (36.8 C)  TempSrc:  Oral Oral Oral  SpO2:  100% 97% 96%  Weight:      Height:        Intake/Output Summary (Last 24 hours) at 07/03/2021  0941 Last data filed at 07/02/2021 2100 Gross per 24 hour  Intake 480 ml  Output --  Net 480 ml    Last 3 Weights 07/02/2021 07/01/2021 06/03/2021  Weight (lbs) 205 lb 3.2 oz 206 lb 212 lb 8.4 oz  Weight (kg) 93.078 kg 93.441 kg 96.4 kg    Body mass index is 31.2 kg/m.  General: Well nourished, well developed, in no acute distress Head: Atraumatic, normal size  Eyes: PEERLA, EOMI  Neck: Supple, no JVD Endocrine: No thryomegaly Cardiac: Normal S1, S2; RRR; 2 out of 6 systolic ejection murmur Lungs: Clear to auscultation bilaterally, no wheezing, rhonchi or rales  Abd: Soft, nontender, no hepatomegaly  Ext: No edema, pulses 2+ Musculoskeletal: No deformities, BUE and BLE strength normal and equal Skin: Warm and dry, no rashes   Neuro: Alert and oriented to person, place, time, and situation, CNII-XII grossly intact, no focal deficits  Psych: Normal mood and affect   Labs  High Sensitivity Troponin:   Recent Labs  Lab 07/01/21 1557 07/01/21 1748  TROPONINIHS 5 6     Cardiac EnzymesNo results for input(s): TROPONINI in the last 168 hours. No results for input(s): TROPIPOC in the last 168 hours.  Chemistry Recent Labs  Lab 07/01/21 1557 07/02/21 0347  NA 141 140  K 4.0 3.3*  CL 107 106  CO2 27 27  GLUCOSE 100* 117*  BUN 10 7*  CREATININE 0.59 0.52  CALCIUM 9.5 9.2  PROT 7.2  --   ALBUMIN 3.7  --   AST 16  --   ALT 12  --   ALKPHOS 52  --   BILITOT 0.6  --   GFRNONAA >60 >60  ANIONGAP 7 7    Hematology Recent Labs  Lab 07/01/21 1557 07/02/21 0347  WBC 6.5 6.0  RBC 3.97 3.70*  HGB 10.2* 9.5*  HCT 33.7* 31.2*  MCV 84.9 84.3  MCH 25.7* 25.7*  MCHC 30.3 30.4  RDW 17.2* 17.4*  PLT 249 252   BNP Recent Labs  Lab 07/02/21 1255  BNP 51.6    DDimer No results for input(s): DDIMER in the last 168 hours.   Radiology  DG Chest 2 View  Result Date: 07/01/2021 CLINICAL DATA:  Chest pain EXAM: CHEST - 2 VIEW COMPARISON:  Chest radiograph 05/30/2021 FINDINGS: The  cardiomediastinal silhouette is within normal limits. There is no focal consolidation or pulmonary edema. There is no pleural effusion or pneumothorax. Right shoulder arthroplasty hardware is again noted. There is no acute osseous abnormality. IMPRESSION: No radiographic evidence of acute cardiopulmonary process. Electronically Signed   By: Valetta Mole M.D.   On: 07/01/2021 16:38   CT Angio Chest PE W/Cm &/Or Wo Cm  Result Date: 07/01/2021 CLINICAL DATA:  Chest pain.  PE suspected, high prob EXAM: CT ANGIOGRAPHY CHEST WITH CONTRAST TECHNIQUE: Multidetector CT imaging of the chest was performed using the standard protocol during bolus administration of intravenous contrast. Multiplanar CT image reconstructions and MIPs were obtained to evaluate the vascular anatomy. Repeat intravenous injection was administered due to poor timing of intravenous contrast on the first images. CONTRAST:  138m OMNIPAQUE IOHEXOL 350 MG/ML SOLN COMPARISON:  CT angiography chest 05/31/2021 FINDINGS: Cardiovascular: Satisfactory opacification of the pulmonary arteries to the segmental level. No evidence of pulmonary embolism. Normal heart size. No significant pericardial effusion. The thoracic aorta is normal in caliber. Mild atherosclerotic plaque of the thoracic aorta. Likely three-vessel coronary artery calcifications. Tiny hiatal hernia. Mediastinum/Nodes: No enlarged mediastinal, hilar, or axillary lymph nodes. Thyroid gland, trachea, and esophagus demonstrate no significant findings. Lungs/Pleura: No focal consolidation. No pulmonary nodule. No pulmonary mass. No pleural effusion. No pneumothorax. Upper Abdomen: No acute abnormality. Musculoskeletal: No chest wall abnormality.  Bilateral gynecomastia. No suspicious lytic or blastic osseous lesions. No acute displaced fracture. Review of the MIP images confirms the above findings. IMPRESSION: 1. No pulmonary embolus. 2. No acute intrapulmonary abnormality. 3. Tiny hiatal hernia.  4. Aortic Atherosclerosis (ICD10-I70.0) including three-vessel coronary artery calcifications. Electronically Signed   By: MIven FinnM.D.   On: 07/01/2021 21:29   CT CORONARY MORPH W/CTA COR W/SCORE W/CA W/CM &/OR WO/CM  Addendum Date: 07/02/2021   ADDENDUM REPORT: 07/02/2021 18:10 CLINICAL DATA:  Chest pain EXAM: Cardiac/Coronary CTA TECHNIQUE: A non-contrast, gated CT scan was obtained with axial slices of 3 mm through the heart for calcium scoring. Calcium scoring was performed using the Agatston method. A 120 kV prospective, gated, contrast cardiac scan was obtained. Gantry rotation speed was 250 msecs and collimation was 0.6 mm. Two sublingual nitroglycerin tablets (0.8  mg) were given. The 3D data set was reconstructed in 5% intervals of the 35-75% of the R-R cycle. Diastolic phases were analyzed on a dedicated workstation using MPR, MIP, and VRT modes. The patient received 95 cc of contrast. FINDINGS: Image quality: Excellent. Noise artifact is: Limited. Coronary Arteries:  Normal coronary origin.  Right dominance. Left main: The left main is a large caliber vessel with a normal take off from the left coronary cusp that bifurcates to form a left anterior descending artery and a left circumflex artery. There is no plaque or stenosis. Left anterior descending artery: The proximal LAD contains minimal calcified plaque (<25%). The mid and distal segments are patent. The LAD gives off 2 patent diagonal branches. Left circumflex artery: The LCX is non-dominant with minimal non-calcified plaque (<25%). The LCX gives off 3 patent obtuse marginal branches. Right coronary artery: The RCA is dominant with normal take off from the right coronary cusp. The RCA contains minimal calcified plaque (<25%). The RCA terminates as a PDA and right posterolateral branch without evidence of plaque or stenosis. Right Atrium: Right atrial size is within normal limits. Right Ventricle: The right ventricular cavity is within  normal limits. Left Atrium: Left atrial size is normal in size with no left atrial appendage filling defect. Left Ventricle: The ventricular cavity size is within normal limits. There are no stigmata of prior infarction. There is no abnormal filling defect. Pulmonary arteries: Normal in size without proximal filling defect. Pulmonary veins: Normal pulmonary venous drainage. Pericardium: Normal thickness with no significant effusion or calcium present. Cardiac valves: The aortic valve is trileaflet without significant calcification. The mitral valve is normal structure with mild annular calcium. Aorta: Normal caliber with no significant disease. Extra-cardiac findings: See attached radiology report for non-cardiac structures. IMPRESSION: 1. Coronary calcium score of 303. This was 85th percentile for age-, sex, and race-matched controls. 2. Normal coronary origin with right dominance. 3. Non-obstructive CAD (<25%) in the LAD/LCX/RCA. RECOMMENDATIONS: 1. Minimal non-obstructive CAD (0-24%). Consider non-atherosclerotic causes of chest pain. Consider preventive therapy and risk factor modification. Eleonore Chiquito, MD Electronically Signed   By: Eleonore Chiquito M.D.   On: 07/02/2021 18:10   Result Date: 07/02/2021 EXAM: OVER-READ INTERPRETATION  CT CHEST The following report is an over-read performed by radiologist Dr. Zetta Bills of [facility], [place] on 07/02/2021. This over-read does not include interpretation of cardiac or coronary anatomy or pathology. The coronary calcium score/coronary CTA interpretation by the cardiologist is attached. COMPARISON:  CT angiography of the chest dated 08/31 of 2022. FINDINGS: Vascular: See dedicated report for cardiovascular details and coronary artery calcium scoring. Calcified aortic atherosclerosis. Mediastinum/Nodes: Chest is imaged only through the mid chest to cover cardiac structures. No adenopathy in the visualized chest. Lungs/Pleura: Airways are patent, visualized  portions of the airways. Minimal basilar atelectasis. No effusion. No gross consolidative process. Upper Abdomen: Mild circumferential thickening of small hiatal hernia and distal esophagus. Otherwise unremarkable appearance of upper abdominal contents. Musculoskeletal: No acute bone finding or destructive bone process. IMPRESSION: Mild circumferential thickening of small hiatal hernia and distal esophagus. Correlate with any clinical evidence of esophagitis with dedicated esophageal evaluation as warranted. Otherwise no acute or significant extracardiac findings. Aortic Atherosclerosis (ICD10-I70.0). Electronically Signed: By: Zetta Bills M.D. On: 07/02/2021 17:17   ECHOCARDIOGRAM COMPLETE  Result Date: 07/02/2021    ECHOCARDIOGRAM REPORT   Patient Name:   Diane [name] Date of Exam: 07/02/2021 Medical Rec #:  [id number]      Height:  68.0 in Accession #:    [id number]     Weight:       205.2 lb Date of Birth:  [birth year]      BSA:          2.066 m Patient Age:    75 years       BP:           144/74 mmHg Patient Gender: F              HR:           69 bpm. Exam Location:  Inpatient Procedure: 2D Echo, Cardiac Doppler and Color Doppler Indications:    Dyspnea R06.00  History:        Patient has no prior history of Echocardiogram examinations.  Sonographer:    Merrie Roof RDCS Referring Phys: [id number] [place]  1. Left ventricular ejection fraction, by estimation, is 60 to 65%. The left ventricle has normal function. The left ventricle has no regional wall motion abnormalities. There is mild left ventricular hypertrophy. Left ventricular diastolic parameters are consistent with Grade I diastolic dysfunction (impaired relaxation).  2. Right ventricular systolic function is normal. The right ventricular size is normal. There is normal pulmonary artery systolic pressure. The estimated right ventricular systolic pressure is [id number] mmHg.  3. Left atrial size was moderately dilated.  4. The  mitral valve is normal in structure. Mild mitral valve regurgitation. No evidence of mitral stenosis.  5. The aortic valve is tricuspid. Aortic valve regurgitation is not visualized. Mild aortic valve sclerosis is present, with no evidence of aortic valve stenosis.  6. The inferior vena cava is normal in size with greater than 50% respiratory variability, suggesting right atrial pressure of 3 mmHg. FINDINGS  Left Ventricle: Left ventricular ejection fraction, by estimation, is 60 to 65%. The left ventricle has normal function. The left ventricle has no regional wall motion abnormalities. The left ventricular internal cavity size was normal in size. There is  mild left ventricular hypertrophy. Left ventricular diastolic parameters are consistent with Grade I diastolic dysfunction (impaired relaxation). Right Ventricle: The right ventricular size is normal. No increase in right ventricular wall thickness. Right ventricular systolic function is normal. There is normal pulmonary artery systolic pressure. The tricuspid regurgitant velocity is 2.38 m/s, and  with an assumed right atrial pressure of 3 mmHg, the estimated right ventricular systolic pressure is [id number] mmHg. Left Atrium: Left atrial size was moderately dilated. Right Atrium: Right atrial size was normal in size. Pericardium: There is no evidence of pericardial effusion. Mitral Valve: The mitral valve is normal in structure. Mild mitral annular calcification. Mild mitral valve regurgitation. No evidence of mitral valve stenosis. Tricuspid Valve: The tricuspid valve is normal in structure. Tricuspid valve regurgitation is trivial. No evidence of tricuspid stenosis. Aortic Valve: The aortic valve is tricuspid. Aortic valve regurgitation is not visualized. Mild aortic valve sclerosis is present, with no evidence of aortic valve stenosis. Aortic valve mean gradient measures 7.0 mmHg. Aortic valve peak gradient measures 12.4 mmHg. Aortic valve area, by VTI measures  1.21 cm. Pulmonic Valve: The pulmonic valve was normal in structure. Pulmonic valve regurgitation is not visualized. No evidence of pulmonic stenosis. Aorta: The aortic root is normal in size and structure. Venous: The inferior vena cava is normal in size with greater than 50% respiratory variability, suggesting right atrial pressure of 3 mmHg. IAS/Shunts: No atrial level shunt detected by color flow Doppler.  LEFT VENTRICLE PLAX 2D LVIDd:  4.80 cm  Diastology LVIDs:         3.30 cm  LV e' medial:    7.94 cm/s LV PW:         1.20 cm  LV E/e' medial:  11.4 LV IVS:        1.20 cm  LV e' lateral:   7.51 cm/s LVOT diam:     1.80 cm  LV E/e' lateral: 12.1 LV SV:         42 LV SV Index:   20 LVOT Area:     2.54 cm  LEFT ATRIUM             Index       RIGHT ATRIUM           Index LA diam:        3.80 cm 1.84 cm/m  RA Area:     12.90 cm LA Vol (A2C):   52.4 ml 25.36 ml/m RA Volume:   29.10 ml  14.08 ml/m LA Vol (A4C):   87.5 ml 42.35 ml/m LA Biplane Vol: 68.4 ml 33.10 ml/m  AORTIC VALVE AV Area (Vmax):    1.14 cm AV Area (Vmean):   1.05 cm AV Area (VTI):     1.21 cm AV Vmax:           176.00 cm/s AV Vmean:          122.000 cm/s AV VTI:            0.350 m AV Peak Grad:      12.4 mmHg AV Mean Grad:      7.0 mmHg LVOT Vmax:         79.00 cm/s LVOT Vmean:        50.400 cm/s LVOT VTI:          0.166 m LVOT/AV VTI ratio: 0.47  AORTA Ao Root diam: 3.10 cm MITRAL VALVE               TRICUSPID VALVE MV Area (PHT): 3.99 cm    TR Peak grad:   22.7 mmHg MV Decel Time: 190 msec    TR Vmax:        238.00 cm/s MV E velocity: 90.80 cm/s MV A velocity: 95.10 cm/s  SHUNTS MV E/A ratio:  0.95        Systemic VTI:  0.17 m                            Systemic Diam: 1.80 cm Candee Furbish MD Electronically signed by Candee Furbish MD Signature Date/Time: 07/02/2021/4:05:03 PM    Final     Cardiac Studies  TTE 07/02/2021  1. Left ventricular ejection fraction, by estimation, is 60 to 65%. The  left ventricle has normal function. The  left ventricle has no regional  wall motion abnormalities. There is mild left ventricular hypertrophy.  Left ventricular diastolic parameters  are consistent with Grade I diastolic dysfunction (impaired relaxation).   2. Right ventricular systolic function is normal. The right ventricular  size is normal. There is normal pulmonary artery systolic pressure. The  estimated right ventricular systolic pressure is [id number] mmHg.   3. Left atrial size was moderately dilated.   4. The mitral valve is normal in structure. Mild mitral valve  regurgitation. No evidence of mitral stenosis.   5. The aortic valve is tricuspid. Aortic valve regurgitation is not  visualized. Mild aortic valve sclerosis is present, with no  evidence of  aortic valve stenosis.   6. The inferior vena cava is normal in size with greater than 50%  respiratory variability, suggesting right atrial pressure of 3 mmHg.   CCTA 07/02/2021 IMPRESSION: 1. Coronary calcium score of 303. This was 85th percentile for age-, sex, and race-matched controls.   2. Normal coronary origin with right dominance.   3. Non-obstructive CAD (<25%) in the LAD/LCX/RCA.   RECOMMENDATIONS: 1. Minimal non-obstructive CAD (0-24%). Consider non-atherosclerotic causes of chest pain. Consider preventive therapy and risk factor modification.  Patient Profile  Diane [name] is a 75 y.o. female with hypertension, diabetes, obesity, recurrent DVT, recurrent small bowel obstruction who was admitted on 07/02/2021 with chest pain and shortness of breath.  Assessment & Plan   #Chest pain, noncardiac #Minimal, nonobstructive CAD -Troponins negative.  EKG nonischemic.  Coronary CTA with minimal and nonobstructive CAD. -Chest pain is noncardiac.  Suspect GI etiology. -Echo was normal. -No signs of heart failure.  BNP is normal. -Would recommend to continue aspirin and statin.  Her LDL is at goal.  She can follow with primary care moving forward.  No need for  cardiology follow-up.  [facility] will sign off.   Medication Recommendations: Aspirin statin Other recommendations (labs, testing, etc): None Follow up as an outpatient: None needed  For questions or updates, please contact [place] Please consult [email or web address] for contact info under   Time Spent with Patient: I have spent a total of 25 minutes with patient reviewing hospital notes, telemetry, EKGs, labs and examining the patient as well as establishing an assessment and plan that was discussed with the patient.  > 50% of time was spent in direct patient care.    Signed, Addison Naegeli. Audie Box, MD, [place]  07/03/2021 9:41 AM

## 2021-07-09 ENCOUNTER — Other Ambulatory Visit: Payer: Self-pay | Admitting: *Deleted

## 2021-07-09 NOTE — Patient Outreach (Signed)
Medical [facility]) Care Management  07/09/2021  Diane [name] 1945-11-17 [id number]   [facility]  post hospital unsuccessful outreach with case closure      Outreach attempt to the listed preferred home and mobile number [phone] No answer The outgoing voice message states the patient's name.  [facility] RN CM left HIPAA [facility] Portability and Accountability Act) compliant voicemail message along with CM's contact info.    Plan: [facility] RN CM scheduled this patient for case closure for unable to reach Unsuccessful outreach letter sent on 06/08/21 Unsuccessful outreach on 06/08/21, 06/12/21, 06/18/21, 07/09/21       Krissy Orebaugh L. Lavina Hamman, RN, BSN, [place] Coordinator Office number [phone] Mobile number [phone]  Main [facility] number [phone] Fax number [phone]

## 2021-07-10 ENCOUNTER — Other Ambulatory Visit: Payer: Self-pay

## 2021-07-10 DIAGNOSIS — I82402 Acute embolism and thrombosis of unspecified deep veins of left lower extremity: Secondary | ICD-10-CM

## 2021-07-12 ENCOUNTER — Emergency Department (HOSPITAL_BASED_OUTPATIENT_CLINIC_OR_DEPARTMENT_OTHER): Payer: Medicare Other

## 2021-07-12 ENCOUNTER — Other Ambulatory Visit: Payer: Self-pay

## 2021-07-12 ENCOUNTER — Encounter (HOSPITAL_BASED_OUTPATIENT_CLINIC_OR_DEPARTMENT_OTHER): Payer: Self-pay | Admitting: *Deleted

## 2021-07-12 ENCOUNTER — Inpatient Hospital Stay (HOSPITAL_BASED_OUTPATIENT_CLINIC_OR_DEPARTMENT_OTHER)
Admission: EM | Admit: 2021-07-12 | Discharge: 2021-07-15 | DRG: 391 | Disposition: A | Discharge status: Home Health | Payer: Medicare Other | Attending: Family Medicine | Admitting: Family Medicine

## 2021-07-12 DIAGNOSIS — E1142 Type 2 diabetes mellitus with diabetic polyneuropathy: Secondary | ICD-10-CM | POA: Diagnosis present

## 2021-07-12 DIAGNOSIS — Z90710 Acquired absence of both cervix and uterus: Secondary | ICD-10-CM

## 2021-07-12 DIAGNOSIS — E1151 Type 2 diabetes mellitus with diabetic peripheral angiopathy without gangrene: Secondary | ICD-10-CM | POA: Diagnosis not present

## 2021-07-12 DIAGNOSIS — Z885 Allergy status to narcotic agent status: Secondary | ICD-10-CM | POA: Diagnosis not present

## 2021-07-12 DIAGNOSIS — R778 Other specified abnormalities of plasma proteins: Secondary | ICD-10-CM

## 2021-07-12 DIAGNOSIS — U071 COVID-19: Secondary | ICD-10-CM | POA: Diagnosis not present

## 2021-07-12 DIAGNOSIS — I2510 Atherosclerotic heart disease of native coronary artery without angina pectoris: Secondary | ICD-10-CM | POA: Diagnosis present

## 2021-07-12 DIAGNOSIS — K56609 Unspecified intestinal obstruction, unspecified as to partial versus complete obstruction: Secondary | ICD-10-CM | POA: Diagnosis not present

## 2021-07-12 DIAGNOSIS — R1013 Epigastric pain: Secondary | ICD-10-CM

## 2021-07-12 DIAGNOSIS — Z7984 Long term (current) use of oral hypoglycemic drugs: Secondary | ICD-10-CM

## 2021-07-12 DIAGNOSIS — K5900 Constipation, unspecified: Secondary | ICD-10-CM | POA: Diagnosis not present

## 2021-07-12 DIAGNOSIS — I248 Other forms of acute ischemic heart disease: Secondary | ICD-10-CM | POA: Diagnosis not present

## 2021-07-12 DIAGNOSIS — Z9049 Acquired absence of other specified parts of digestive tract: Secondary | ICD-10-CM

## 2021-07-12 DIAGNOSIS — M069 Rheumatoid arthritis, unspecified: Secondary | ICD-10-CM | POA: Diagnosis not present

## 2021-07-12 DIAGNOSIS — Z7901 Long term (current) use of anticoagulants: Secondary | ICD-10-CM

## 2021-07-12 DIAGNOSIS — M1990 Unspecified osteoarthritis, unspecified site: Secondary | ICD-10-CM | POA: Diagnosis present

## 2021-07-12 DIAGNOSIS — E669 Obesity, unspecified: Secondary | ICD-10-CM | POA: Diagnosis present

## 2021-07-12 DIAGNOSIS — I10 Essential (primary) hypertension: Secondary | ICD-10-CM | POA: Diagnosis present

## 2021-07-12 DIAGNOSIS — R1084 Generalized abdominal pain: Secondary | ICD-10-CM | POA: Diagnosis not present

## 2021-07-12 DIAGNOSIS — Z881 Allergy status to other antibiotic agents status: Secondary | ICD-10-CM | POA: Diagnosis not present

## 2021-07-12 DIAGNOSIS — R112 Nausea with vomiting, unspecified: Secondary | ICD-10-CM | POA: Diagnosis not present

## 2021-07-12 DIAGNOSIS — Z96611 Presence of right artificial shoulder joint: Secondary | ICD-10-CM | POA: Diagnosis present

## 2021-07-12 DIAGNOSIS — Z86718 Personal history of other venous thrombosis and embolism: Secondary | ICD-10-CM

## 2021-07-12 DIAGNOSIS — Z6830 Body mass index (BMI) 30.0-30.9, adult: Secondary | ICD-10-CM

## 2021-07-12 DIAGNOSIS — R079 Chest pain, unspecified: Secondary | ICD-10-CM | POA: Diagnosis not present

## 2021-07-12 DIAGNOSIS — R1110 Vomiting, unspecified: Secondary | ICD-10-CM | POA: Diagnosis not present

## 2021-07-12 DIAGNOSIS — Z79899 Other long term (current) drug therapy: Secondary | ICD-10-CM

## 2021-07-12 DIAGNOSIS — M05742 Rheumatoid arthritis with rheumatoid factor of left hand without organ or systems involvement: Secondary | ICD-10-CM | POA: Diagnosis not present

## 2021-07-12 DIAGNOSIS — K219 Gastro-esophageal reflux disease without esophagitis: Secondary | ICD-10-CM | POA: Diagnosis not present

## 2021-07-12 DIAGNOSIS — I517 Cardiomegaly: Secondary | ICD-10-CM | POA: Diagnosis not present

## 2021-07-12 DIAGNOSIS — I519 Heart disease, unspecified: Secondary | ICD-10-CM | POA: Diagnosis not present

## 2021-07-12 DIAGNOSIS — M05741 Rheumatoid arthritis with rheumatoid factor of right hand without organ or systems involvement: Secondary | ICD-10-CM | POA: Diagnosis not present

## 2021-07-12 LAB — CBC WITH DIFFERENTIAL/PLATELET
Abs Immature Granulocytes: 0.03 10*3/uL (ref 0.00–0.07)
Basophils Absolute: 0 10*3/uL (ref 0.0–0.1)
Basophils Relative: 0 %
Eosinophils Absolute: 0 10*3/uL (ref 0.0–0.5)
Eosinophils Relative: 0 %
HCT: 30.7 % — ABNORMAL LOW (ref 36.0–46.0)
Hemoglobin: 9.5 g/dL — ABNORMAL LOW (ref 12.0–15.0)
Immature Granulocytes: 0 %
Lymphocytes Relative: 6 %
Lymphs Abs: 0.5 10*3/uL — ABNORMAL LOW (ref 0.7–4.0)
MCH: 24.7 pg — ABNORMAL LOW (ref 26.0–34.0)
MCHC: 30.9 g/dL (ref 30.0–36.0)
MCV: 79.7 fL — ABNORMAL LOW (ref 80.0–100.0)
Monocytes Absolute: 1.1 10*3/uL — ABNORMAL HIGH (ref 0.1–1.0)
Monocytes Relative: 14 %
Neutro Abs: 6.5 10*3/uL (ref 1.7–7.7)
Neutrophils Relative %: 80 %
Platelets: 255 10*3/uL (ref 150–400)
RBC: 3.85 MIL/uL — ABNORMAL LOW (ref 3.87–5.11)
RDW: 17.4 % — ABNORMAL HIGH (ref 11.5–15.5)
WBC: 8.2 10*3/uL (ref 4.0–10.5)
nRBC: 0 % (ref 0.0–0.2)

## 2021-07-12 LAB — COMPREHENSIVE METABOLIC PANEL
ALT: 14 U/L (ref 0–44)
AST: 19 U/L (ref 15–41)
Albumin: 3.8 g/dL (ref 3.5–5.0)
Alkaline Phosphatase: 53 U/L (ref 38–126)
Anion gap: 6 (ref 5–15)
BUN: 9 mg/dL (ref 8–23)
CO2: 26 mmol/L (ref 22–32)
Calcium: 8.7 mg/dL — ABNORMAL LOW (ref 8.9–10.3)
Chloride: 104 mmol/L (ref 98–111)
Creatinine, Ser: 0.63 mg/dL (ref 0.44–1.00)
GFR, Estimated: >60 mL/min (ref >60)
Glucose, Bld: 137 mg/dL — ABNORMAL HIGH (ref 70–99)
Potassium: 3.8 mmol/L (ref 3.5–5.1)
Sodium: 136 mmol/L (ref 135–145)
Total Bilirubin: 0.3 mg/dL (ref 0.3–1.2)
Total Protein: 7.1 g/dL (ref 6.5–8.1)

## 2021-07-12 LAB — TROPONIN I (HIGH SENSITIVITY)
Troponin I (High Sensitivity): 5 ng/L (ref <18)
Troponin I (High Sensitivity): 283 ng/L (ref <18)

## 2021-07-12 LAB — PROTIME-INR
INR: 1.5 — ABNORMAL HIGH (ref 0.8–1.2)
Prothrombin Time: 18.2 seconds — ABNORMAL HIGH (ref 11.4–15.2)

## 2021-07-12 LAB — URINALYSIS, ROUTINE W REFLEX MICROSCOPIC
Bilirubin Urine: NEGATIVE
Glucose, UA: NEGATIVE mg/dL
Hgb urine dipstick: NEGATIVE
Ketones, ur: NEGATIVE mg/dL
Leukocytes,Ua: NEGATIVE
Nitrite: NEGATIVE
Protein, ur: NEGATIVE mg/dL
Specific Gravity, Urine: 1.015 (ref 1.005–1.030)
pH: 6 (ref 5.0–8.0)

## 2021-07-12 LAB — LIPASE, BLOOD: Lipase: 36 U/L (ref 11–51)

## 2021-07-12 LAB — RESP PANEL BY RT-PCR (FLU A&B, COVID) ARPGX2
Influenza A by PCR: NEGATIVE
Influenza B by PCR: NEGATIVE
SARS Coronavirus 2 by RT PCR: POSITIVE — AB

## 2021-07-12 LAB — LACTIC ACID, PLASMA: Lactic Acid, Venous: 1.4 mmol/L (ref 0.5–1.9)

## 2021-07-12 LAB — APTT: aPTT: 34 seconds (ref 24–36)

## 2021-07-12 IMAGING — DX DG CHEST 1V PORT
1 series · 1 of 1 positions shown · non-contrast
Comparison: 07/01/2021

CLINICAL DATA: Vomiting

EXAM:
PORTABLE CHEST 1 VIEW

[chest ap]
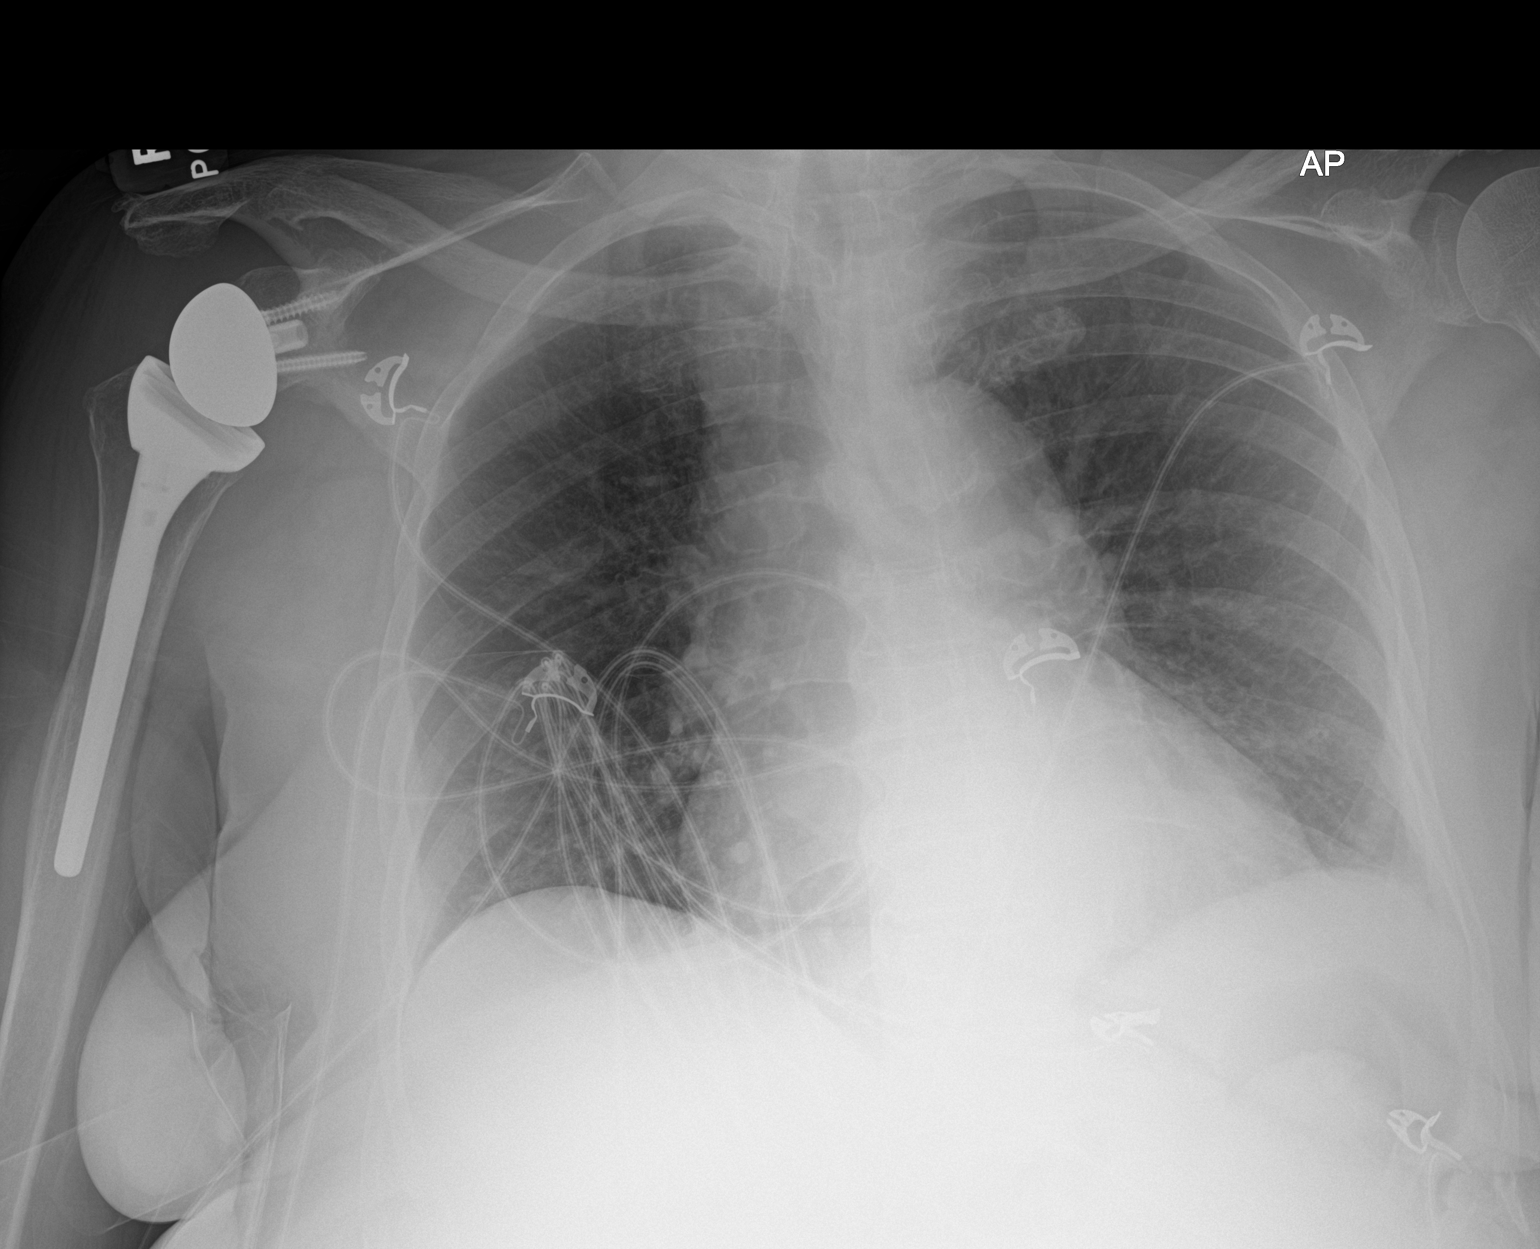

[1 of 1 positions shown; findings below may reference images not displayed]

FINDINGS: Right shoulder replacement. No focal opacity or pleural effusion.
Borderline cardiomegaly. Aortic atherosclerosis. No pneumothorax
IMPRESSION: No active disease.  Borderline cardiomegaly

## 2021-07-12 IMAGING — CT CT ABD-PELV W/ CM
2 of 5 series · 16 of 46 positions shown, 18 images · IV contrast (Omnipaque)
Comparison: CT chest 07/01/2021, CT abdomen pelvis 05/22/2021

CLINICAL DATA: Abdomen pain with vomiting

EXAM:
CT ABDOMEN AND PELVIS WITH CONTRAST
TECHNIQUE: Multidetector CT imaging of the abdomen and pelvis was performed
using the standard protocol following bolus administration of
intravenous contrast.
CONTRAST:  80mL OMNIPAQUE IOHEXOL 350 MG/ML SOLN

[Series 2: axial st · axial · 0.89mm/px · z∈[+811,+1211]mm · 13 of 92 slices shown, 15 images]
[im 6/92  soft-tissue]
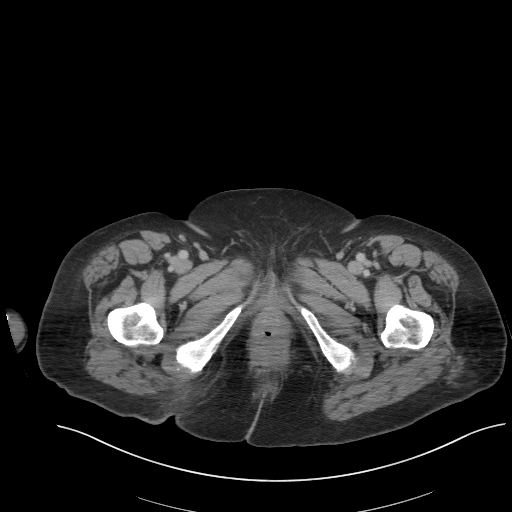
[im 6/92  bone]
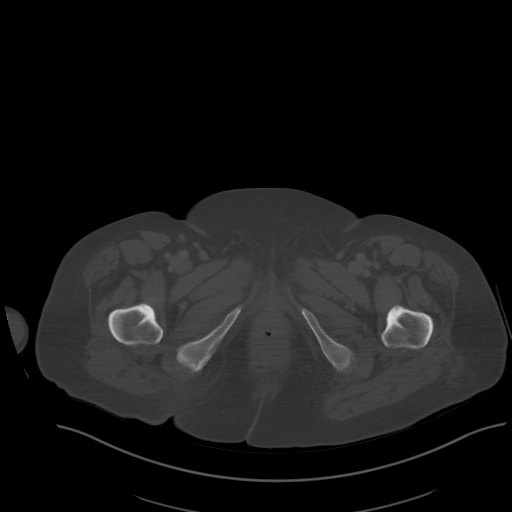
[im 11/92  soft-tissue]
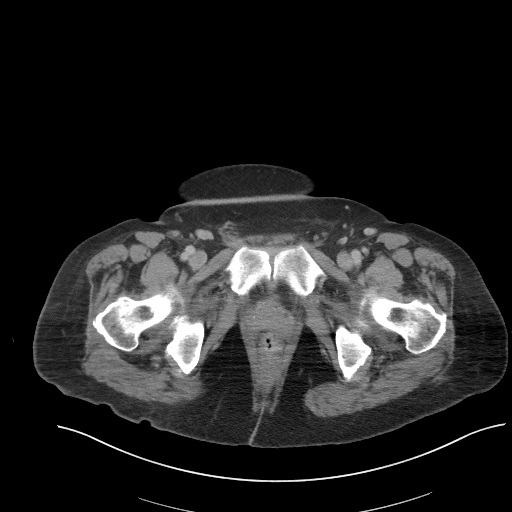
[im 21/92  soft-tissue]
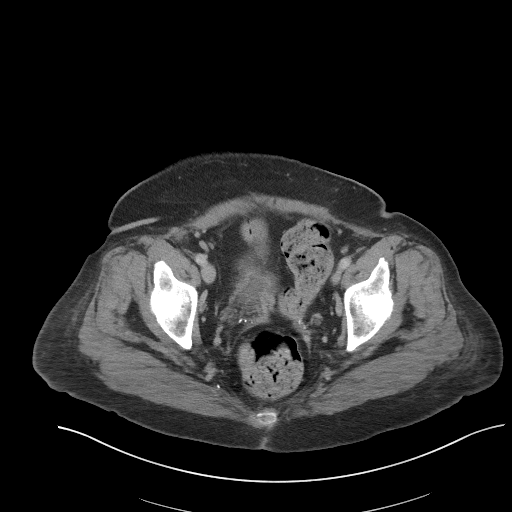
[im 26/92  soft-tissue]
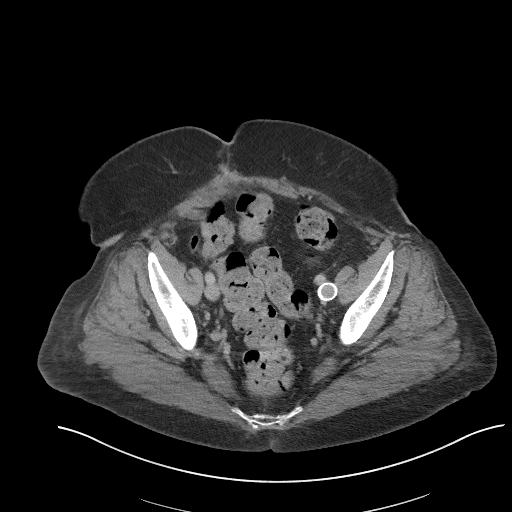
[im 31/92  soft-tissue]
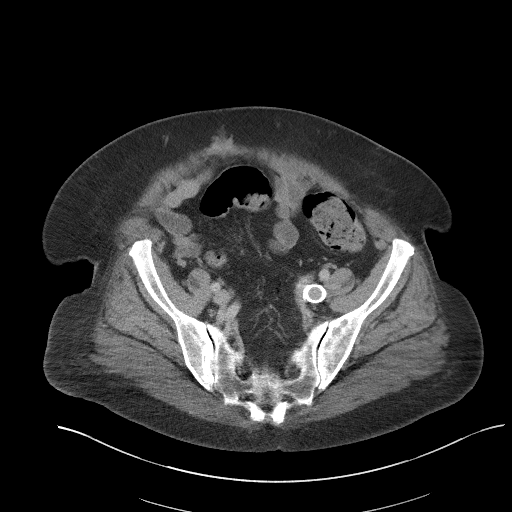
[im 41/92  soft-tissue]
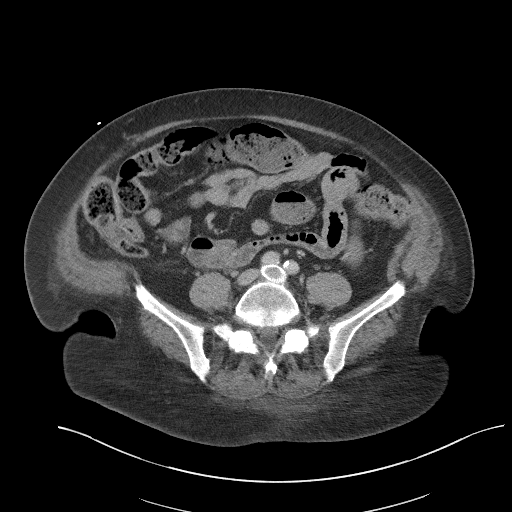
[im 46/92  soft-tissue]
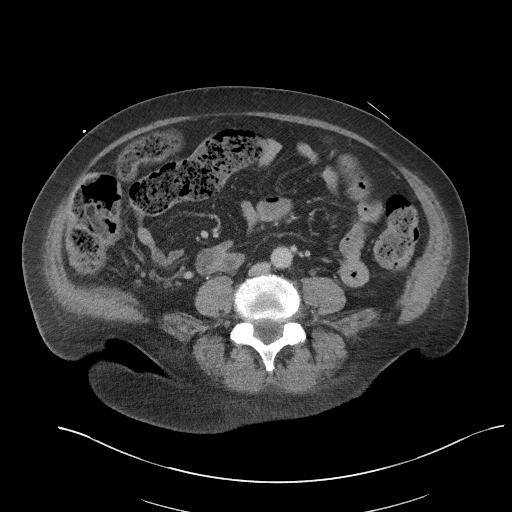
[im 51/92  soft-tissue]
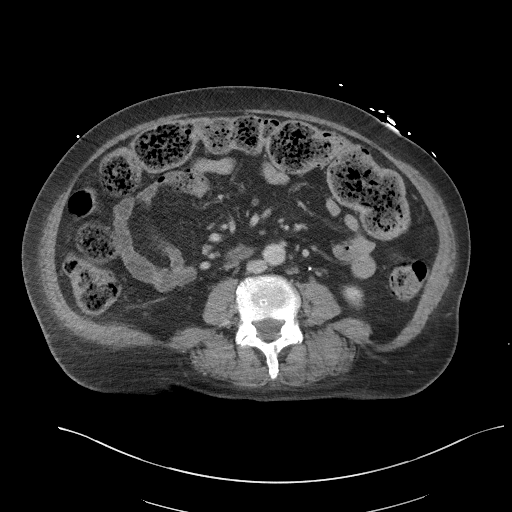
[im 61/92  soft-tissue]
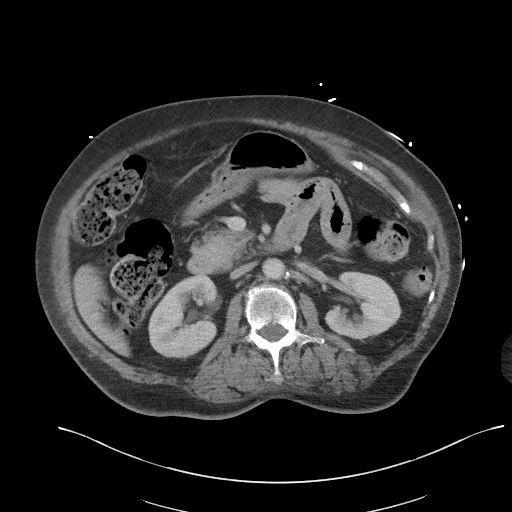
[im 61/92  bone]
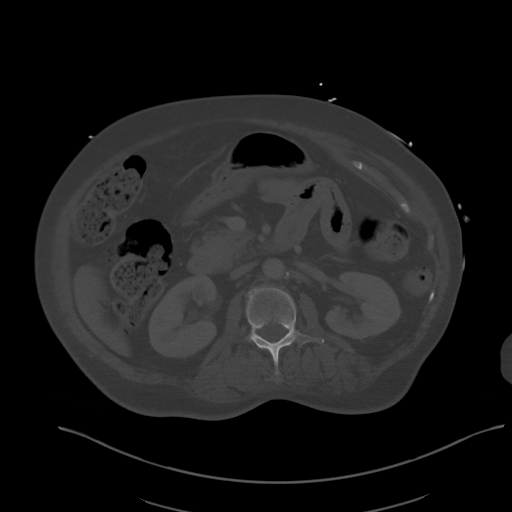
[im 66/92  soft-tissue]
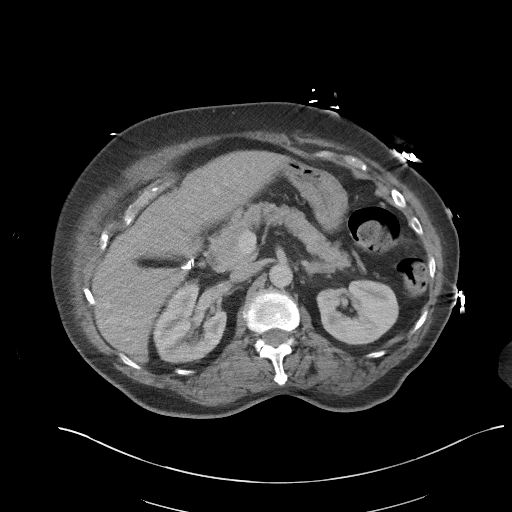
[im 71/92  soft-tissue]
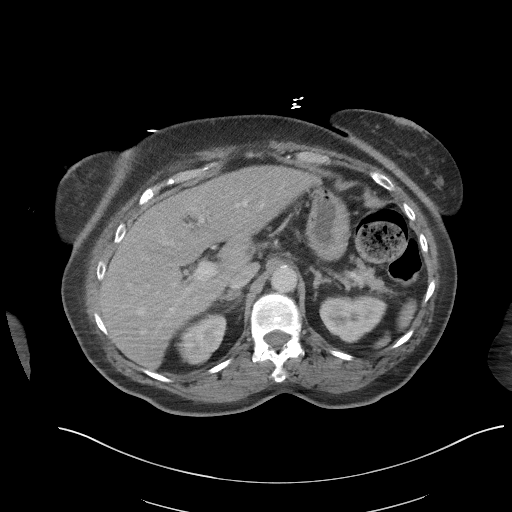
[im 81/92  soft-tissue]
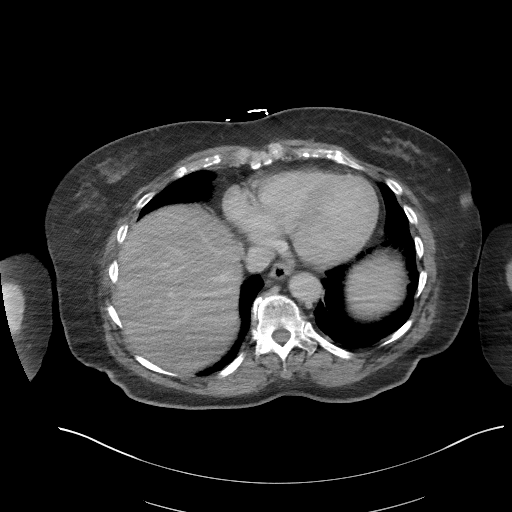
[im 86/92  soft-tissue]
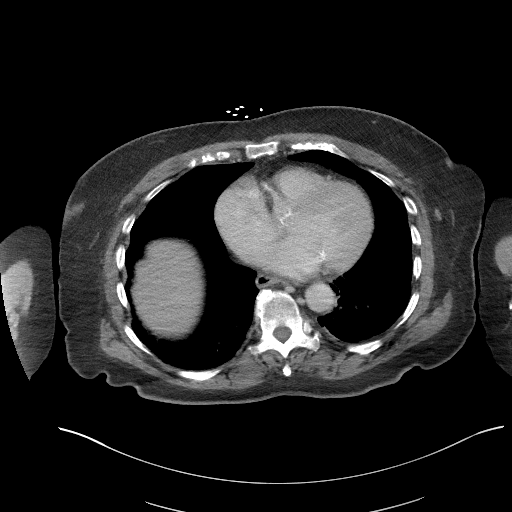

[Series 5: coronal st · coronal · 0.83mm/px · 3 of 103 slices shown]
[im 35/103  soft-tissue]
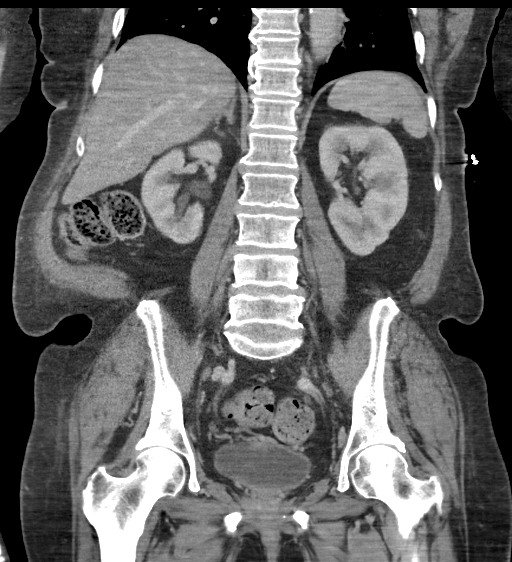
[im 46/103  soft-tissue]
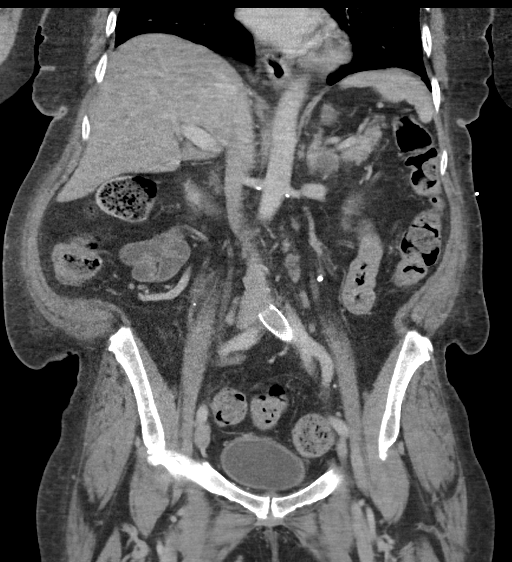
[im 57/103  soft-tissue]
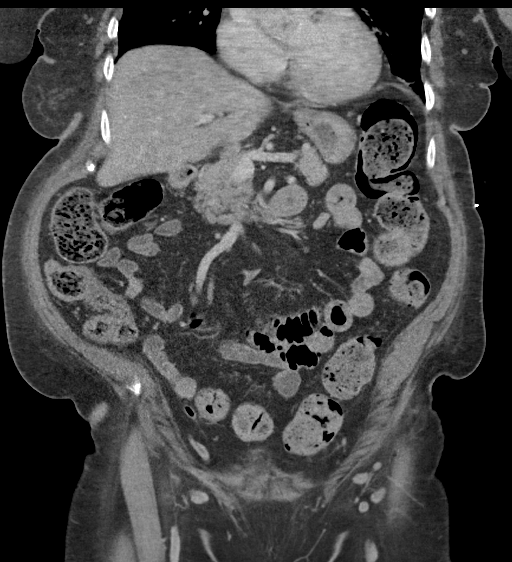

[16 of 46 positions shown; findings below may reference images not displayed]

FINDINGS: Lower chest: Lung bases demonstrate no acute consolidation or
effusion. Borderline cardiomegaly. Mild coronary vascular
calcification.

Hepatobiliary: No focal liver abnormality is seen. Status post
cholecystectomy. No biliary dilatation.

Pancreas: Unremarkable. No pancreatic ductal dilatation or
surrounding inflammatory changes.

Spleen: Normal in size without focal abnormality.

Adrenals/Urinary Tract: Adrenal glands are normal. Kidneys show no
hydronephrosis. The bladder is normal

Stomach/Bowel: The stomach is nonenlarged. No dilated proximal and
mid small bowel. Dilated terminal ileum measuring up to 3.5 cm with
fecalized appearance, series 2, image 48. Large amount of stool
throughout the colon and rectum. No acute bowel wall thickening.
Negative appendix

Vascular/Lymphatic: Mild aortic atherosclerosis. No aneurysm. No
suspicious nodes. Vascular stent in the left iliac vein.

Reproductive: Status post hysterectomy. No adnexal masses.

Other: Negative for free air or free fluid

Musculoskeletal: No acute or significant osseous findings.
IMPRESSION: 1. Dilated segment of terminal ileum with fecalized appearance of
the terminal ileum to the level of ileocecal valve, with large
amount of stool throughout the colon and rectum, findings could be
secondary to slow transit, though partial obstruction at the level
of ileocecal valve is also possible.
2. Otherwise no CT evidence for acute intra-abdominal or pelvic
abnormality

## 2021-07-12 MED ORDER — ALBUTEROL SULFATE HFA 108 (90 BASE) MCG/ACT IN AERS
2.0000 | INHALATION_SPRAY | Freq: Once | RESPIRATORY_TRACT | Status: AC | PRN
  Filled 2021-07-12: qty 6.7

## 2021-07-12 MED ORDER — ONDANSETRON HCL 4 MG/2ML IJ SOLN
4.0000 mg | Freq: Once | INTRAMUSCULAR | Status: AC
  Administered 2021-07-12: 4 mg via INTRAVENOUS
  Filled 2021-07-12: qty 2

## 2021-07-12 MED ORDER — IOHEXOL 350 MG/ML SOLN
80.0000 mL | Freq: Once | INTRAVENOUS | Status: AC | PRN
  Administered 2021-07-12: 80 mL via INTRAVENOUS

## 2021-07-12 MED ORDER — METRONIDAZOLE 500 MG/100ML IV SOLN
500.0000 mg | Freq: Once | INTRAVENOUS | Status: AC
  Administered 2021-07-12: 500 mg via INTRAVENOUS
  Filled 2021-07-12: qty 100

## 2021-07-12 MED ORDER — DIPHENHYDRAMINE HCL 50 MG/ML IJ SOLN: 50.0000 mg | Freq: Once | INTRAMUSCULAR | Status: AC | PRN

## 2021-07-12 MED ORDER — BEBTELOVIMAB 175 MG/2 ML IV (EUA): 175.0000 mg | Freq: Once | INTRAMUSCULAR | Status: DC

## 2021-07-12 MED ORDER — EPINEPHRINE 0.3 MG/0.3ML IJ SOAJ
0.3000 mg | Freq: Once | INTRAMUSCULAR | Status: AC | PRN
  Filled 2021-07-12: qty 0.6

## 2021-07-12 MED ORDER — SODIUM CHLORIDE 0.9 % IV SOLN
2.0000 g | Freq: Once | INTRAVENOUS | Status: AC
  Administered 2021-07-12: 2 g via INTRAVENOUS
  Filled 2021-07-12: qty 20

## 2021-07-12 MED ORDER — FAMOTIDINE IN NACL 20-0.9 MG/50ML-% IV SOLN
20.0000 mg | Freq: Once | INTRAVENOUS | Status: AC | PRN
  Filled 2021-07-12: qty 50

## 2021-07-12 MED ORDER — LACTATED RINGERS IV BOLUS (SEPSIS)
1000.0000 mL | Freq: Once | INTRAVENOUS | Status: AC
  Administered 2021-07-12: 1000 mL via INTRAVENOUS

## 2021-07-12 MED ORDER — METHYLPREDNISOLONE SODIUM SUCC 125 MG IJ SOLR: 125.0000 mg | Freq: Once | INTRAMUSCULAR | Status: AC | PRN

## 2021-07-12 MED ORDER — SODIUM CHLORIDE 0.9 % IV SOLN: INTRAVENOUS | Status: AC | PRN

## 2021-07-12 MED ORDER — ACETAMINOPHEN 325 MG PO TABS
650.0000 mg | ORAL_TABLET | Freq: Once | ORAL | Status: AC
  Administered 2021-07-12: 650 mg via ORAL
  Filled 2021-07-12 (×2): qty 2

## 2021-07-12 NOTE — ED Triage Notes (Signed)
Pt reports multiple episodes of vomiting since this morning. States she has "stomach problems"

## 2021-07-12 NOTE — ED Provider Notes (Signed)
[facility] Provider Note   CSN: [id number] Arrival date & time: 07/12/21  1731     History Chief Complaint  Patient presents with   Emesis    Diane [name] is a 75 y.o. female.  HPI     75 year old female with a history of type 2 diabetes, hypertension, recent admission for chest pain August 30 1 September second, prior recent admission to that for small bowel obstruction, left lower extremity DVT status post mechanical thrombectomy x2 on Eliquis, rheumatoid arthritis, cholecystectomy who presents with concern for abdominal pain, nausea, vomiting and found to be febrile.  Reports that this is her 6 time coming to the hospital for this.  On chart review, the appears she is discussing nausea vomiting for small bowel obstruction.  She was unaware of the fever prior to presentation to the emergency department.  Reports the symptoms began yesterday.  Reports that she has had continuous nausea and vomiting, is unsure, anytime she has thrown up.  Denies having any diarrhea or constipation.  Reports that she has had urinary frequency and urgency beginning last night.  Has right upper quadrant abdominal pain with radiation to the right flank.  Nothing seems to make it better or worse.  Has not had anything for nausea at home.  Reports she had a cough starting yesterday.  Reports she has chest pain which is chronic, and remarks on her recent admission for the same.  This chest pain is not changed today. Reports she has been told it is not her heart.  Describes it as pain like someone hitting her lasting for minutes and resolving. Not worse with exertion, no dyspnea.  No vb/discharge.  Past Medical History:  Diagnosis Date   Abnormal nuclear stress test 10/18/2013   Arm pain 10/18/2013   Arthritis    Cataract    RIGHT EYE   Chest pain 10/18/2013   Diabetes mellitus    dx over 5 yrs ago   GERD (gastroesophageal reflux disease)    Heart murmur    "yrs ago in  [place] -- been here since 1999, "   History of hiatal hernia    Hypertension    Prolonged Q-T interval on ECG 04/20/2017   Pulmonary edema 04/20/2017    Patient Active Problem List   Diagnosis Date Noted   Left leg DVT ([place]) 05/30/2021   Normochromic normocytic anemia 02/12/2021   Constipation    Fever    Acute DVT (deep venous thrombosis) ([place]) 02/04/2021   AKI (acute kidney injury) ([place]) 01/23/2021   Rheumatoid arthritis ([place]) 01/23/2021   SBO (small bowel obstruction) ([place]) 01/22/2021   Postoperative anemia due to acute blood loss 06/18/2018   Type 2 diabetes mellitus with diabetic neuropathy, unspecified ([place]) 06/18/2018   Peripheral neuropathy 06/18/2018   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   Gait difficulty 06/18/2018   Community acquired pneumonia of right lower lobe of lung 06/11/2018   S/P shoulder replacement, right 06/02/2018   Prolonged Q-T interval on ECG 04/20/2017   Pulmonary edema 04/20/2017   Chest pain 10/18/2013   Abnormal nuclear stress test 10/18/2013   Arm pain 10/18/2013   Hypertension     Past Surgical History:  Procedure Laterality Date   ABDOMINAL HYSTERECTOMY     BREAST EXCISIONAL BIOPSY     BREAST SURGERY     biopsy for cystic breasts   [place]     with cooking oil yrs ago   CATARACT EXTRACTION W/ INTRAOCULAR LENS IMPLANT Left  2017   CHOLECYSTECTOMY     COLONOSCOPY     DILATION AND CURETTAGE OF UTERUS     ESOPHAGOGASTRODUODENOSCOPY ENDOSCOPY     EYE SURGERY Right    left eye has new lens   HERNIA REPAIR     INSERTION OF MESH N/A 11/24/2017   Procedure: INSERTION OF MESH;  Surgeon: Donnie Mesa, MD;  Location: [facility];  Service: General;  Laterality: N/A;   LEFT HEART CATH AND CORONARY ANGIOGRAPHY N/A 04/21/2017   Procedure: Left Heart Cath and Coronary Angiography;  Surgeon: Lorretta Harp, MD;  Location: [facility];  Service: Cardiovascular;  Laterality: N/A;   PERIPHERAL VASCULAR THROMBECTOMY Left 02/06/2021   Procedure:  PERIPHERAL VASCULAR THROMBECTOMY;  Surgeon: Elam Dutch, MD;  Location: [facility];  Service: Cardiovascular;  Laterality: Left;   PERIPHERAL VASCULAR THROMBECTOMY N/A 06/01/2021   Procedure: PERIPHERAL VASCULAR THROMBECTOMY;  Surgeon: Waynetta Sandy, MD;  Location: [facility];  Service: Cardiovascular;  Laterality: N/A;   REVERSE SHOULDER ARTHROPLASTY Right 06/02/2018   REVERSE SHOULDER ARTHROPLASTY Right 06/02/2018   Procedure: REVERSE SHOULDER ARTHROPLASTY;  Surgeon: Netta Cedars, MD;  Location: [facility];  Service: Orthopedics;  Laterality: Right;   SHOULDER ARTHROSCOPY WITH SUBACROMIAL DECOMPRESSION AND OPEN ROTATOR C Right 03/11/2017   Procedure: RIGHT SHOULDER ARTHROSCOPY, subacromial decompression, MINI-OPEN rotator cuff repair, OPEN distal clavicle resection;  Surgeon: Netta Cedars, MD;  Location: [facility];  Service: Orthopedics;  Laterality: Right;   UMBILICAL HERNIA REPAIR N/A 11/24/2017   Procedure: [place];  Surgeon: Donnie Mesa, MD;  Location: [facility];  Service: General;  Laterality: N/A;     OB History   No obstetric history on file.     Family History  Family history unknown: Yes    Social History   Tobacco Use   Smoking status: Never   Smokeless tobacco: Never  Vaping Use   Vaping Use: Never used  Substance Use Topics   Alcohol use: Never   Drug use: Never    Home Medications Prior to Admission medications   Medication Sig Start Date End Date Taking? Authorizing Provider  amLODipine (NORVASC) 5 MG tablet Take 5 mg by mouth daily.    [provider]  apixaban (ELIQUIS) 5 MG TABS tablet Take 5 mg by mouth 2 (two) times daily.    [provider]  Ascorbic Acid (VITAMIN C ADULT GUMMIES PO) Take 2 tablets by mouth daily.    [provider]  docusate sodium (COLACE) 100 MG capsule Take 1 capsule (100 mg total) by mouth 2 (two) times daily. 05/27/21   Lavina Hamman, MD  DULoxetine (CYMBALTA) 20 MG  capsule Take 20 mg by mouth every morning. 04/30/21   [provider]  folic acid (FOLVITE) 1 MG tablet Take 1 tablet (1 mg total) by mouth daily. 02/26/21   Medina-Vargas, Monina C, NP  gabapentin (NEURONTIN) 300 MG capsule Take 300 mg by mouth daily.    [provider]  losartan (COZAAR) 50 MG tablet Take 1 tablet (50 mg total) by mouth daily. 05/27/21   Lavina Hamman, MD  metFORMIN (GLUCOPHAGE-XR) 500 MG 24 hr tablet Take 1,000 mg by mouth daily with breakfast.    [provider]  methotrexate (RHEUMATREX) 2.5 MG tablet Take 8 tablets (20 mg total) by mouth once a week. 8 tablets weekly (Sunday) 02/26/21   Medina-Vargas, Monina C, NP  naproxen sodium (ALEVE) 220 MG tablet Take 440 mg by mouth daily as needed (  pain).    [provider]  pantoprazole (PROTONIX) 40 MG tablet Take 1 tablet (40 mg total) by mouth daily. 07/03/21   Patrecia Pour, MD  polyethylene glycol (MIRALAX / GLYCOLAX) 17 g packet Take 17 g by mouth 2 (two) times daily. 05/27/21   Lavina Hamman, MD  rosuvastatin (CRESTOR) 10 MG tablet Take 1 tablet (10 mg total) by mouth daily. 02/26/21   Medina-Vargas, Monina C, NP  simethicone (MYLICON) 80 MG chewable tablet Chew 1 tablet (80 mg total) by mouth 4 (four) times daily. 05/27/21   Lavina Hamman, MD    Allergies    Cephalexin, Tapentadol, Codeine, and Nucynta [tapentadol hcl]  Review of Systems   Review of Systems  Constitutional:  Positive for fatigue and fever.  HENT:  Negative for sore throat.   Eyes:  Negative for visual disturbance.  Respiratory:  Positive for cough. Negative for shortness of breath.   Cardiovascular:  Positive for chest pain.  Gastrointestinal:  Positive for abdominal pain, nausea and vomiting. Negative for constipation and diarrhea.  Genitourinary:  Positive for frequency. Negative for difficulty urinating.  Musculoskeletal:  Positive for back pain. Negative for neck pain.  Skin:  Negative for rash.  Neurological:   Negative for syncope and headaches.   Physical Exam Updated Vital Signs BP (!) 155/74   Pulse 88   Temp (!) 101.1 F (38.4 C) (Oral)   Resp 19   Ht '5\' 8"'$  (1.727 m)   Wt 93.4 kg   SpO2 100%   BMI 31.32 kg/m   Physical Exam Vitals and nursing note reviewed.  Constitutional:      General: She is not in acute distress.    Appearance: She is well-developed. She is not diaphoretic.  HENT:     Head: Normocephalic and atraumatic.  Eyes:     Conjunctiva/sclera: Conjunctivae normal.  Cardiovascular:     Rate and Rhythm: Regular rhythm. Tachycardia present.     Heart sounds: Normal heart sounds. No murmur heard.   No friction rub. No gallop.  Pulmonary:     Effort: Pulmonary effort is normal. No respiratory distress.     Breath sounds: Normal breath sounds. No wheezing or rales.  Abdominal:     General: There is distension.     Palpations: Abdomen is soft.     Tenderness: There is abdominal tenderness (diffuse tenderness, worse in epigastrium and RUQ). There is right CVA tenderness. There is no guarding.  Musculoskeletal:        General: No tenderness.     Cervical back: Normal range of motion.  Skin:    General: Skin is warm and dry.     Findings: No erythema or rash.  Neurological:     Mental Status: She is alert and oriented to person, place, and time.    ED Results / Procedures / Treatments   Labs (all labs ordered are listed, but only abnormal results are displayed) Labs Reviewed  RESP PANEL BY RT-PCR (FLU A&B, COVID) ARPGX2 - Abnormal; Notable for the following components:      Result Value   SARS Coronavirus 2 by RT PCR POSITIVE (*)    All other components within normal limits  COMPREHENSIVE METABOLIC PANEL - Abnormal; Notable for the following components:   Glucose, Bld 137 (*)    Calcium 8.7 (*)    All other components within normal limits  CBC WITH DIFFERENTIAL/PLATELET - Abnormal; Notable for the following components:   RBC 3.85 (*)    Hemoglobin  9.5 (*)     HCT 30.7 (*)    MCV 79.7 (*)    MCH 24.7 (*)    RDW 17.4 (*)    Lymphs Abs 0.5 (*)    Monocytes Absolute 1.1 (*)    All other components within normal limits  PROTIME-INR - Abnormal; Notable for the following components:   Prothrombin Time 18.2 (*)    INR 1.5 (*)    All other components within normal limits  TROPONIN I (HIGH SENSITIVITY) - Abnormal; Notable for the following components:   Troponin I (High Sensitivity) 283 (*)    All other components within normal limits  TROPONIN I (HIGH SENSITIVITY) - Abnormal; Notable for the following components:   Troponin I (High Sensitivity) 1,553 (*)    All other components within normal limits  TROPONIN I (HIGH SENSITIVITY) - Abnormal; Notable for the following components:   Troponin I (High Sensitivity) 1,866 (*)    All other components within normal limits  CULTURE, BLOOD (ROUTINE X 2)  CULTURE, BLOOD (ROUTINE X 2)  URINE CULTURE  LACTIC ACID, PLASMA  APTT  URINALYSIS, ROUTINE W REFLEX MICROSCOPIC  LIPASE, BLOOD  LACTIC ACID, PLASMA  TROPONIN I (HIGH SENSITIVITY)    EKG EKG Interpretation  Date/Time:  Sunday July 12 2021 22:55:46 EDT Ventricular Rate:  91 PR Interval:  153 QRS Duration: 92 QT Interval:  405 QTC Calculation: 499 R Axis:   8 Text Interpretation: Sinus rhythm Atrial premature complex Low voltage, extremity leads Borderline prolonged QT interval When compared with ECG of EARLIER SAME DATE No significant change was found ' Confirmed by Delora Fuel ([id number]) on 07/12/2021 11:54:56 PM  Radiology CT ABDOMEN PELVIS W CONTRAST  Result Date: 07/12/2021 CLINICAL DATA:  Abdomen pain with vomiting EXAM: CT ABDOMEN AND PELVIS WITH CONTRAST TECHNIQUE: Multidetector CT imaging of the abdomen and pelvis was performed using the standard protocol following bolus administration of intravenous contrast. CONTRAST:  61m OMNIPAQUE IOHEXOL 350 MG/ML SOLN COMPARISON:  CT chest 07/01/2021, CT abdomen pelvis 05/22/2021 FINDINGS: Lower  chest: Lung bases demonstrate no acute consolidation or effusion. Borderline cardiomegaly. Mild coronary vascular calcification. Hepatobiliary: No focal liver abnormality is seen. Status post cholecystectomy. No biliary dilatation. Pancreas: Unremarkable. No pancreatic ductal dilatation or surrounding inflammatory changes. Spleen: Normal in size without focal abnormality. Adrenals/Urinary Tract: Adrenal glands are normal. Kidneys show no hydronephrosis. The bladder is normal Stomach/Bowel: The stomach is nonenlarged. No dilated proximal and mid small bowel. Dilated terminal ileum measuring up to 3.5 cm with fecalized appearance, series 2, image 48. Large amount of stool throughout the colon and rectum. No acute bowel wall thickening. Negative appendix Vascular/Lymphatic: Mild aortic atherosclerosis. No aneurysm. No suspicious nodes. Vascular stent in the left iliac vein. Reproductive: Status post hysterectomy. No adnexal masses. Other: Negative for free air or free fluid Musculoskeletal: No acute or significant osseous findings. IMPRESSION: 1. Dilated segment of terminal ileum with fecalized appearance of the terminal ileum to the level of ileocecal valve, with large amount of stool throughout the colon and rectum, findings could be secondary to slow transit, though partial obstruction at the level of ileocecal valve is also possible. 2. Otherwise no CT evidence for acute intra-abdominal or pelvic abnormality Electronically Signed   By: KDonavan FoilM.D.   On: 07/12/2021 20:10   DG Chest Port 1 View  Result Date: 07/12/2021 CLINICAL DATA:  Vomiting EXAM: PORTABLE CHEST 1 VIEW COMPARISON:  07/01/2021 FINDINGS: Right shoulder replacement. No focal opacity or pleural effusion. Borderline cardiomegaly. Aortic atherosclerosis. No  pneumothorax IMPRESSION: No active disease.  Borderline cardiomegaly Electronically Signed   By: Donavan Foil M.D.   On: 07/12/2021 19:36    Procedures Procedures   Medications  Ordered in ED Medications  0.9 %  sodium chloride infusion (has no administration in time range)  bebtelovimab EUA injection SOLN 175 mg (175 mg Intravenous Not Given 07/12/21 2155)  diphenhydrAMINE (BENADRYL) injection 50 mg (has no administration in time range)  famotidine (PEPCID) IVPB 20 mg premix (has no administration in time range)  methylPREDNISolone sodium succinate (SOLU-MEDROL) 125 mg/2 mL injection 125 mg (has no administration in time range)  albuterol (VENTOLIN HFA) 108 (90 Base) MCG/ACT inhaler 2 puff (has no administration in time range)  EPINEPHrine (EPI-PEN) injection 0.3 mg (has no administration in time range)  acetaminophen (TYLENOL) tablet 650 mg (650 mg Oral Given 07/13/21 0030)  cefTRIAXone (ROCEPHIN) 2 g in sodium chloride 0.9 % 100 mL IVPB (0 g Intravenous Stopped 07/12/21 1915)  metroNIDAZOLE (FLAGYL) IVPB 500 mg (0 mg Intravenous Stopped 07/12/21 2144)  lactated ringers bolus 1,000 mL (0 mLs Intravenous Stopped 07/12/21 2158)  acetaminophen (TYLENOL) tablet 650 mg (650 mg Oral Given 07/12/21 1852)  ondansetron (ZOFRAN) injection 4 mg (4 mg Intravenous Given 07/12/21 1853)  iohexol (OMNIPAQUE) 350 MG/ML injection 80 mL (80 mLs Intravenous Contrast Given 07/12/21 1952)    ED Course  I have reviewed the triage vital signs and the nursing notes.  Pertinent labs & imaging results that were available during my care of the patient were reviewed by me and considered in my medical decision making (see chart for details).    MDM Rules/Calculators/A&P                            75 year old female with a history of type 2 diabetes, hypertension, recent admission for chest pain August 30 1 September second, prior recent admission to that for small bowel obstruction, left lower extremity DVT status post mechanical thrombectomy x2 on Eliquis, rheumatoid arthritis, cholecystectomy who presents with concern for abdominal pain, nausea, vomiting and found to be febrile.  DDx includes  cholangitis, pyelonephritis, appendectomy, COVID, SBO, ACS.  No dyspnea, on eliquis, doubt PE.   Given initial concern for fever, tachycardia, abdominal pain, ordered blood cx and empiric abx for intraabdominal infection.  CT shows concern for possible partial small bowel obstruction.  History of similar admission and appearance of CT. No continuing vomiting after antiemetics, will forego NG tube. Reports she has had BM today and passed flatus but given CT findings an severe abdominal tenderness and pain this AM, will admit for continued care. No current vomiting, will forego NG tube.    COVID 19 testing positive.  Ordered MAB given not tolerating po for covid treatment however she declines.  Initial troponin negative. Second troponin in 200s. Discussed with Dr. Renella Cunas and notified hospitalist.  Awaiting 3rd troponin at time of transfer of care and pan for admission to [facility] vs [facility] depnding on results. Denies any current chest pain or abdominal pain on rrevaulatiaon.  Will admit for further care.     Final Clinical Impression(s) / ED Diagnoses Final diagnoses:  Elevated troponin  COVID-19  Epigastric pain    Rx / DC Orders ED Discharge Orders     None        Gareth Morgan, MD 07/13/21 [phone]

## 2021-07-12 NOTE — ED Notes (Signed)
Bebtelovimab injection face sheet printed and reviewed with the patient. Patient declined the medication. Dr. Billy Fischer informed.

## 2021-07-12 NOTE — ED Notes (Signed)
EDP ([facility]) notified of critical troponin of 283.

## 2021-07-12 NOTE — ED Notes (Signed)
ED Provider at bedside. 

## 2021-07-12 NOTE — ED Notes (Signed)
Pt at CT

## 2021-07-13 ENCOUNTER — Telehealth: Payer: Self-pay | Admitting: Student in an Organized Health Care Education/Training Program

## 2021-07-13 DIAGNOSIS — Z7984 Long term (current) use of oral hypoglycemic drugs: Secondary | ICD-10-CM | POA: Diagnosis not present

## 2021-07-13 DIAGNOSIS — R1013 Epigastric pain: Secondary | ICD-10-CM | POA: Diagnosis present

## 2021-07-13 DIAGNOSIS — Z79899 Other long term (current) drug therapy: Secondary | ICD-10-CM | POA: Diagnosis not present

## 2021-07-13 DIAGNOSIS — I519 Heart disease, unspecified: Secondary | ICD-10-CM | POA: Diagnosis not present

## 2021-07-13 DIAGNOSIS — K219 Gastro-esophageal reflux disease without esophagitis: Secondary | ICD-10-CM | POA: Diagnosis present

## 2021-07-13 DIAGNOSIS — Z885 Allergy status to narcotic agent status: Secondary | ICD-10-CM | POA: Diagnosis not present

## 2021-07-13 DIAGNOSIS — K56609 Unspecified intestinal obstruction, unspecified as to partial versus complete obstruction: Secondary | ICD-10-CM

## 2021-07-13 DIAGNOSIS — I10 Essential (primary) hypertension: Secondary | ICD-10-CM | POA: Diagnosis present

## 2021-07-13 DIAGNOSIS — E1142 Type 2 diabetes mellitus with diabetic polyneuropathy: Secondary | ICD-10-CM | POA: Diagnosis present

## 2021-07-13 DIAGNOSIS — Z90710 Acquired absence of both cervix and uterus: Secondary | ICD-10-CM | POA: Diagnosis not present

## 2021-07-13 DIAGNOSIS — M05741 Rheumatoid arthritis with rheumatoid factor of right hand without organ or systems involvement: Secondary | ICD-10-CM | POA: Diagnosis not present

## 2021-07-13 DIAGNOSIS — I2510 Atherosclerotic heart disease of native coronary artery without angina pectoris: Secondary | ICD-10-CM | POA: Diagnosis present

## 2021-07-13 DIAGNOSIS — Z7901 Long term (current) use of anticoagulants: Secondary | ICD-10-CM | POA: Diagnosis not present

## 2021-07-13 DIAGNOSIS — Z6830 Body mass index (BMI) 30.0-30.9, adult: Secondary | ICD-10-CM | POA: Diagnosis not present

## 2021-07-13 DIAGNOSIS — U071 COVID-19: Secondary | ICD-10-CM | POA: Diagnosis present

## 2021-07-13 DIAGNOSIS — M1990 Unspecified osteoarthritis, unspecified site: Secondary | ICD-10-CM | POA: Diagnosis present

## 2021-07-13 DIAGNOSIS — Z86718 Personal history of other venous thrombosis and embolism: Secondary | ICD-10-CM | POA: Diagnosis not present

## 2021-07-13 DIAGNOSIS — Z9049 Acquired absence of other specified parts of digestive tract: Secondary | ICD-10-CM | POA: Diagnosis not present

## 2021-07-13 DIAGNOSIS — Z96611 Presence of right artificial shoulder joint: Secondary | ICD-10-CM | POA: Diagnosis present

## 2021-07-13 DIAGNOSIS — M069 Rheumatoid arthritis, unspecified: Secondary | ICD-10-CM | POA: Diagnosis present

## 2021-07-13 DIAGNOSIS — R079 Chest pain, unspecified: Secondary | ICD-10-CM | POA: Diagnosis not present

## 2021-07-13 DIAGNOSIS — E669 Obesity, unspecified: Secondary | ICD-10-CM | POA: Diagnosis present

## 2021-07-13 DIAGNOSIS — I248 Other forms of acute ischemic heart disease: Secondary | ICD-10-CM | POA: Diagnosis present

## 2021-07-13 DIAGNOSIS — R778 Other specified abnormalities of plasma proteins: Secondary | ICD-10-CM | POA: Diagnosis not present

## 2021-07-13 DIAGNOSIS — Z881 Allergy status to other antibiotic agents status: Secondary | ICD-10-CM | POA: Diagnosis not present

## 2021-07-13 DIAGNOSIS — E1151 Type 2 diabetes mellitus with diabetic peripheral angiopathy without gangrene: Secondary | ICD-10-CM | POA: Diagnosis present

## 2021-07-13 DIAGNOSIS — K5900 Constipation, unspecified: Secondary | ICD-10-CM | POA: Diagnosis present

## 2021-07-13 LAB — GLUCOSE, CAPILLARY
Glucose-Capillary: 58 mg/dL — ABNORMAL LOW (ref 70–99)
Glucose-Capillary: 87 mg/dL (ref 70–99)
Glucose-Capillary: 98 mg/dL (ref 70–99)

## 2021-07-13 LAB — CBG MONITORING, ED
Glucose-Capillary: 58 mg/dL — ABNORMAL LOW (ref 70–99)
Glucose-Capillary: 99 mg/dL (ref 70–99)
Glucose-Capillary: 123 mg/dL — ABNORMAL HIGH (ref 70–99)

## 2021-07-13 LAB — TROPONIN I (HIGH SENSITIVITY)
Troponin I (High Sensitivity): 1553 ng/L (ref <18)
Troponin I (High Sensitivity): 1774 ng/L (ref <18)
Troponin I (High Sensitivity): 1866 ng/L (ref <18)

## 2021-07-13 MED ORDER — ASPIRIN EC 81 MG PO TBEC
81.0000 mg | DELAYED_RELEASE_TABLET | Freq: Every day | ORAL | Status: DC
  Administered 2021-07-14 – 2021-07-15 (×2): 81 mg via ORAL
  Filled 2021-07-13 (×2): qty 1

## 2021-07-13 MED ORDER — ACETAMINOPHEN 325 MG PO TABS
650.0000 mg | ORAL_TABLET | Freq: Four times a day (QID) | ORAL | Status: DC | PRN
  Administered 2021-07-13 – 2021-07-14 (×2): 650 mg via ORAL
  Filled 2021-07-13 (×2): qty 2

## 2021-07-13 MED ORDER — HYDROCODONE-ACETAMINOPHEN 5-325 MG PO TABS
1.0000 | ORAL_TABLET | Freq: Once | ORAL | Status: AC
  Administered 2021-07-13: 1 via ORAL
  Filled 2021-07-13: qty 1

## 2021-07-13 MED ORDER — DULOXETINE HCL 20 MG PO CPEP
20.0000 mg | ORAL_CAPSULE | Freq: Every morning | ORAL | Status: DC
  Administered 2021-07-14 – 2021-07-15 (×2): 20 mg via ORAL
  Filled 2021-07-13 (×2): qty 1

## 2021-07-13 MED ORDER — LACTATED RINGERS IV SOLN: INTRAVENOUS | Status: DC

## 2021-07-13 MED ORDER — ACETAMINOPHEN 325 MG PO TABS
650.0000 mg | ORAL_TABLET | ORAL | Status: DC | PRN
  Administered 2021-07-13 – 2021-07-14 (×2): 650 mg via ORAL
  Filled 2021-07-13 (×3): qty 2

## 2021-07-13 MED ORDER — ROSUVASTATIN CALCIUM 5 MG PO TABS
10.0000 mg | ORAL_TABLET | Freq: Every day | ORAL | Status: DC
  Administered 2021-07-15: 10 mg via ORAL
  Filled 2021-07-13 (×2): qty 2

## 2021-07-13 MED ORDER — ASPIRIN 325 MG PO TABS
325.0000 mg | ORAL_TABLET | Freq: Every day | ORAL | Status: DC
  Administered 2021-07-13: 325 mg via ORAL
  Filled 2021-07-13: qty 1

## 2021-07-13 MED ORDER — DEXTROSE 50 % IV SOLN
12.5000 g | INTRAVENOUS | Status: AC
  Administered 2021-07-13: 12.5 g via INTRAVENOUS
  Filled 2021-07-13: qty 50

## 2021-07-13 MED ORDER — FOLIC ACID 1 MG PO TABS
1.0000 mg | ORAL_TABLET | Freq: Every day | ORAL | Status: DC
  Administered 2021-07-15: 1 mg via ORAL
  Filled 2021-07-13 (×2): qty 1

## 2021-07-13 MED ORDER — INSULIN ASPART 100 UNIT/ML IJ SOLN
0.0000 [IU] | Freq: Three times a day (TID) | INTRAMUSCULAR | Status: DC
Start: 2021-07-14 — End: 2021-07-15

## 2021-07-13 MED ORDER — ACETAMINOPHEN 650 MG RE SUPP: 650.0000 mg | Freq: Four times a day (QID) | RECTAL | Status: DC | PRN

## 2021-07-13 MED ORDER — PANTOPRAZOLE SODIUM 40 MG PO TBEC
40.0000 mg | DELAYED_RELEASE_TABLET | Freq: Two times a day (BID) | ORAL | Status: DC
  Administered 2021-07-13 – 2021-07-15 (×4): 40 mg via ORAL
  Filled 2021-07-13 (×4): qty 1

## 2021-07-13 MED ORDER — SIMETHICONE 80 MG PO CHEW
80.0000 mg | CHEWABLE_TABLET | Freq: Four times a day (QID) | ORAL | Status: DC | PRN
  Administered 2021-07-13: 80 mg via ORAL
  Filled 2021-07-13: qty 1

## 2021-07-13 MED ORDER — APIXABAN 5 MG PO TABS
5.0000 mg | ORAL_TABLET | Freq: Two times a day (BID) | ORAL | Status: DC
  Administered 2021-07-13 – 2021-07-15 (×4): 5 mg via ORAL
  Filled 2021-07-13 (×4): qty 1

## 2021-07-13 NOTE — ED Notes (Signed)
Patient reports 7/10 back pain, MD notified, order for '5mg'$  hydrocodone received

## 2021-07-13 NOTE — Telephone Encounter (Signed)
Paged by Dr. Billy Fischer regarding Diane [name] at [facility] ED.   Patient presented with emesis and "stomach issues." Since 09/11 AM. Recent admission for CP and prior to that SBO. She has had recurrent SBO with N/V and had continuous N/V prior hospital eval. Also reporting cough which is new and intermittent CP which is chronic. Said CP was similar to recent admission. CT A/P with c/f partial obstruction of terminal ileum. hsT with delta (5->283). Normal during recent admission for similar CP (7-5-6). ECG with NSR and no ischemic changes. cCT with non obstructive CAD on 07/02/21. Her CP only lasts for seconds at a time and is same as prior. I think her elevated hsT is 2/2 to demand ischemia with partial SBO and continuous N/V. Also found to be covid positive. Would not tx for NSTEMI. Was planned for transfer and admission to [facility] however with elevated troponin there was question as to whether she needed to be redirected to [facility]. I don't think she will need any invasive testing provided at [facility] and could go to [facility] for further management. Either option is fine. Please have medicine consult cardiology on hospital arrival if additional evaluation is needed.

## 2021-07-13 NOTE — Progress Notes (Addendum)
hsT checked several more times in ED at [facility] and peaked at [place] (5->283->1553->1866->1774).  Pending transfer to [facility] for further evaluation.  Given repeated ECGs with no ischemic changes and recent coronary CTA with minimal obstructive disease I would not treat at this point for primary ACS.  I still think that her troponin elevation may be a demand ischemia although the delta is more impressive than when we initially conferenced.  This also could be related to COVID either as COVID myocarditis or just a marker of COVID severity although her symptoms from [place] did not seem as pronounced as those that are presumed to be secondary to recurrent SBO.  Stress induced cardiomyopathy could also result in troponins with this delta.  Given that the symptoms are similar per report to numerous prior episodes of recurrent SBO I think that the troponin elevation is secondary to this.  She is still passing some gas but has severe abdominal tenderness on palpation.  Do not recommend heparin at this time. Still at [facility] currently, ED planning now for ED-ED transfer as bed availability for direct admission to [facility] is limited.

## 2021-07-13 NOTE — Progress Notes (Signed)
  Hypoglycemic Event  CBG: 53  Treatment: D50 25 mL (12.5 gm)  Symptoms: None  Follow-up CBG: Time:1839 CBG Result:87  Possible Reasons for Event: Patient is NPO due to n/v  Comments/MD notified: dextrose 5% solution of 12.'5mg'$  was given.     Beckey Rutter

## 2021-07-13 NOTE — ED Notes (Signed)
Patient given 2 orange juice and 2 servings of grits with 2 packets of sugar added for glucose of 58
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
## 2021-07-13 NOTE — ED Provider Notes (Signed)
  Provider Note MRN:  [id number]  Arrival date & time: 07/13/21    ED Course and Medical Decision Making  Assumed care from Dr. Billy Fischer at shift change.  Fever, COVID-19, partial small bowel obstruction, also having chest pain and rising troponin from 200 to 1500.  Accepted for admission at [facility] with cardiology following, given the bed situation in [facility] will transfer to the medical emergency department.  Dr. Carolynne Edouard excepting physician.  .Critical Care Performed by: Maudie Flakes, MD Authorized by: Maudie Flakes, MD   Critical care provider statement:    Critical care time (minutes):  32   Critical care was necessary to treat or prevent imminent or life-threatening deterioration of the following conditions: NSTEMI.   Critical care was time spent personally by me on the following activities:  Discussions with consultants, evaluation of patient's response to treatment, examination of patient, ordering and performing treatments and interventions, ordering and review of laboratory studies, ordering and review of radiographic studies, pulse oximetry, re-evaluation of patient's condition, obtaining history from patient or surrogate and review of old charts   I assumed direction of critical care for this patient from another provider in my specialty: yes     Care discussed with: accepting provider at another facility    Final Clinical Impressions(s) / ED Diagnoses     ICD-10-CM   1. Elevated troponin  R77.8     2. COVID-19  U07.1       ED Discharge Orders     None       Discharge Instructions   None     Barth Kirks. [place] Small, [place] [email or web address]    Maudie Flakes, MD 07/13/21 [phone]
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
## 2021-07-13 NOTE — ED Notes (Signed)
EDP ([facility]) notified of critical troponin of 1866.

## 2021-07-14 ENCOUNTER — Inpatient Hospital Stay (HOSPITAL_COMMUNITY): Payer: Medicare Other

## 2021-07-14 DIAGNOSIS — K5900 Constipation, unspecified: Principal | ICD-10-CM

## 2021-07-14 DIAGNOSIS — M05741 Rheumatoid arthritis with rheumatoid factor of right hand without organ or systems involvement: Secondary | ICD-10-CM

## 2021-07-14 DIAGNOSIS — R079 Chest pain, unspecified: Secondary | ICD-10-CM | POA: Diagnosis not present

## 2021-07-14 DIAGNOSIS — M05742 Rheumatoid arthritis with rheumatoid factor of left hand without organ or systems involvement: Secondary | ICD-10-CM

## 2021-07-14 DIAGNOSIS — R778 Other specified abnormalities of plasma proteins: Secondary | ICD-10-CM

## 2021-07-14 DIAGNOSIS — I2510 Atherosclerotic heart disease of native coronary artery without angina pectoris: Secondary | ICD-10-CM

## 2021-07-14 DIAGNOSIS — I10 Essential (primary) hypertension: Secondary | ICD-10-CM

## 2021-07-14 LAB — GLUCOSE, CAPILLARY
Glucose-Capillary: 120 mg/dL — ABNORMAL HIGH (ref 70–99)
Glucose-Capillary: 137 mg/dL — ABNORMAL HIGH (ref 70–99)
Glucose-Capillary: 124 mg/dL — ABNORMAL HIGH (ref 70–99)
Glucose-Capillary: 118 mg/dL — ABNORMAL HIGH (ref 70–99)

## 2021-07-14 LAB — URINE CULTURE

## 2021-07-14 LAB — ECHOCARDIOGRAM LIMITED
Area-P 1/2: 3.5 cm2
Height: 68 in
S' Lateral: 3 cm
Weight: 3216.95 oz

## 2021-07-14 MED ORDER — BISACODYL 10 MG RE SUPP
10.0000 mg | Freq: Every day | RECTAL | Status: DC | PRN
  Administered 2021-07-14: 10 mg via RECTAL
  Filled 2021-07-14: qty 1

## 2021-07-14 MED ORDER — DOCUSATE SODIUM 100 MG PO CAPS
100.0000 mg | ORAL_CAPSULE | Freq: Two times a day (BID) | ORAL | Status: DC
  Administered 2021-07-14 – 2021-07-15 (×3): 100 mg via ORAL
  Filled 2021-07-14 (×3): qty 1

## 2021-07-14 MED ORDER — LACTULOSE 10 GM/15ML PO SOLN
10.0000 g | Freq: Three times a day (TID) | ORAL | Status: DC
  Administered 2021-07-14 – 2021-07-15 (×2): 10 g via ORAL
  Filled 2021-07-14 (×2): qty 15

## 2021-07-14 MED ORDER — POLYETHYLENE GLYCOL 3350 17 G PO PACK
17.0000 g | PACK | Freq: Two times a day (BID) | ORAL | Status: DC
  Administered 2021-07-14 – 2021-07-15 (×3): 17 g via ORAL
  Filled 2021-07-14 (×3): qty 1

## 2021-07-14 MED ORDER — SENNA 8.6 MG PO TABS
1.0000 | ORAL_TABLET | Freq: Two times a day (BID) | ORAL | Status: DC
  Administered 2021-07-14 – 2021-07-15 (×2): 8.6 mg via ORAL
  Filled 2021-07-14 (×2): qty 1

## 2021-07-14 NOTE — Progress Notes (Signed)
Phlebotomist failed  to draw blood, they attempted 2x. Dr Tonie Griffith was made aware. Will try to collect labs in am. IVF was ordered.pt still NPO. No complains of N/V.

## 2021-07-14 NOTE — Progress Notes (Signed)
   07/14/21 1520  Unmeasured Output  Stool Occurrence 1  Urine Characteristics  Urinary Incontinence No  Hygiene Peri care  Stool Characteristics  Bowel Incontinence No  Stool Type Type 1 (Separate hard lumps)  Stool Descriptors Brown;Clay  Stool Amount Large  Stool Source Rectum   Patient w/ large hard brown/clay stool post sup soap enema. Patient states she has abdomen pain for the last 3 days. Education provided on diet. Patient verbalized understanding.

## 2021-07-14 NOTE — Progress Notes (Signed)
Initial Nutrition Assessment  DOCUMENTATION CODES:   Obesity unspecified  INTERVENTION:   -RD will follow for diet advancement and add supplements as appropriate  NUTRITION DIAGNOSIS:   Increased nutrient needs related to acute illness (COVID-19) as evidenced by estimated needs.  GOAL:   Patient will meet greater than or equal to 90% of their needs  MONITOR:   PO intake, Supplement acceptance, Diet advancement, Labs, Weight trends, Skin, I & O's  REASON FOR ASSESSMENT:   Malnutrition Screening Tool    ASSESSMENT:   Diane [name] is a 75 y.o. female with a pertinent history of type 2 diabetes, hypertension, recent admission for chest pain 8/31, prior recent admission to that for small bowel obstruction "Admitted for SBO and improved with conservative measures on 3/24 - 4/1 and 7/22/- 7/27. Also severely constipated during admission 7/30 - 8/5", left lower extremity DVT status post mechanical thrombectomy x2 on Eliquis, rheumatoid arthritis, cholecystectomy who presented to [facility] ED with concern for abdominal pain, nausea, vomiting and found to be febrile.  Pt admitted with SBO and COVID-19 infection.   Per general surgery notes, CT of A/P revealed large stool burden in colon with dilated distal ileum and some focalization. Plan for aggressive bowel regimen and no current plan for surgery.   Pt unavailable at time of visit. Attempted to speak with pt via call to hospital room phone, however, unable to reach. RD unable to obtain further nutrition-related history or complete nutrition-focused physical exam at this time.    Reviewed wt hx; pt has experienced a 5.4% wt loss over the past month, which is significant for time frame.   Medications reviewed and include colace, folic acid, protonix, miralax, and lactated ringers infusion @ 100 ml/hr.    Lab Results  Component Value Date   HGBA1C 7.8 (H) 05/22/2021   PTA DM medications are 1000 mg metformin BID.   Labs reviewed:  CBGS: 98-137 (inpatient orders for glycemic control are 0-6 units insulin aspart TID with meals).    Diet Order:   Diet Order             Diet NPO time specified Except for: Sips with Meds, Ice Chips  Diet effective now                   EDUCATION NEEDS:   Not appropriate for education at this time  Skin:  Skin Assessment: Reviewed RN Assessment  Last BM:  Unknown  Height:   Ht Readings from Last 1 Encounters:  07/13/21 '5\' 8"'$  (1.727 m)    Weight:   Wt Readings from Last 1 Encounters:  07/14/21 91.2 kg    Ideal Body Weight:  63.6 kg  BMI:  Body mass index is 30.57 kg/m.  Estimated Nutritional Needs:   Kcal:  2000-2200  Protein:  95-110 grams  Fluid:  > 2 L    Loistine Chance, RD, LDN, [place] Registered Dietitian II Certified Diabetes Care and Education Specialist Please refer to [facility] for RD and/or RD on-call/weekend/after hours pager

## 2021-07-14 NOTE — Progress Notes (Signed)
PROGRESS NOTE  Diane [name] [id number] DOB: 1945-11-29 DOA: 07/12/2021 PCP: [name], MD   LOS: 1 day   Brief narrative:  Diane [name] is a 75 y.o. female with a pertinent history of type 2 diabetes, hypertension, recent admission for chest pain 8/31, prior recent admission to for small bowel obstruction presented to the hospital with abdominal pain, nausea vomiting and was noted to be febrile as well.  Patient was concerned that she had small bowel obstruction again.  In the ED, patient was also noted to have elevated troponin.  Cardiology was consulted.  EKG showed borderline prolonged QTC.  CT scan of the abdomen showed large amount of stool throughout the colon and rectum, findings could be secondary to slow transit, though partial obstruction at the level of ileocecal valve is also possible.  Patient was then admitted hospital for further evaluation and treatment.  Assessment/Plan:  Active Problems:   Hypertension   SBO (small bowel obstruction) (HCC)   Rheumatoid arthritis (HCC)   Constipation   Acute on chronic abdominal pain with possible partial small bowel obstruction on the CT scan. Seen by general surgery.  CT scan of the abdomen with large stool burden.  Surgery recommending aggressive bowel regimen.  Seen today and recommend Ice chips and NPO.  Elevated troponin. Seen by cardiology.  Unlikely to be acute coronary syndrome.  Recent admission for chest pain.  Coronary CTA was unremarkable at that time with minimal CAD.  Troponins are mildly elevated and flat.  EKG nonischemic.  2D Echocardiogram showed normal LV function and normal normal wall motion abnormality.  Off aspirin and statins.  Continue Eliquis.  No plans for invasive treatment at this time.  Incidental COVID positive.  Status post vaccination in the past.  On Eliquis.  On room air.  Check inflammatory markers.  Diabetes mellitus type 2.  Continue sliding-scale insulin, Accu-Cheks, currently  NPO.  Continue IV fluids.  History of left lower extremity DVT status post mechanical thrombectomy on Eliquis.  History of rheumatoid  arthritis, supportive care.   DVT prophylaxis: Place TED hose Start: 07/13/21 1952 apixaban (ELIQUIS) tablet 5 mg    Code Status: Full  Family Communication: none today.   Status is: Inpatient  Remains inpatient appropriate because:IV treatments appropriate due to intensity of illness or inability to take PO and Inpatient level of care appropriate due to severity of illness  Dispo: The patient is from: Home              Anticipated d/c is to: Home when constipation resolves and symptoms improved.              Patient currently is not medically stable to d/c.   Difficult to place patient No  Consultants: Cardiology General surgery  Procedures: None  Anti-infectives:  Rocephin IV Metronidazole  Anti-infectives (From admission, onward)    Start     Dose/Rate Route Frequency Ordered Stop   07/12/21 1815  cefTRIAXone (ROCEPHIN) 2 g in sodium chloride 0.9 % 100 mL IVPB        2 g 200 mL/hr over 30 Minutes Intravenous  Once 07/12/21 1814 07/12/21 1915   07/12/21 1815  metroNIDAZOLE (FLAGYL) IVPB 500 mg        500 mg 100 mL/hr over 60 Minutes Intravenous  Once 07/12/21 1814 07/12/21 2144      Subjective: Today, patient was seen and examined at bedside.  Patient denies any nausea, vomiting currently but has had no bowel movement.  Complains of  mild shortness of breath.  Denies chest pain at this time.  Febrile at this time.  Objective: Vitals:   07/14/21 0826 07/14/21 1116  BP:  126/73  Pulse: 88 80  Resp: 13 16  Temp: 98.2 F (36.8 C) 98.5 F (36.9 C)  SpO2: 99% 96%    Intake/Output Summary (Last 24 hours) at 07/14/2021 1337 Last data filed at 07/14/2021 1144 Gross per 24 hour  Intake 1386.1 ml  Output --  Net 1386.1 ml   Filed Weights   07/12/21 1746 07/13/21 1758 07/14/21 0000  Weight: 93.4 kg 91.9 kg 91.2 kg   Body  mass index is 30.57 kg/m.   Physical Exam:  GENERAL: Patient is alert awake and oriented. Not in obvious distress.  Obese HENT: No scleral pallor or icterus. Pupils equally reactive to light. Oral mucosa is moist NECK: is supple, no gross swelling noted. CHEST: Clear to auscultation. No crackles or wheezes.  Diminished breath sounds bilaterally. CVS: S1 and S2 heard, no murmur. Regular rate and rhythm.  ABDOMEN: Soft, nonspecific tenderness on palpation, bowel sounds are present. EXTREMITIES: No edema. CNS: Cranial nerves are intact. No focal motor deficits. SKIN: warm and dry without rashes.  Data Review: I have personally reviewed the following laboratory data and studies,  CBC: Recent Labs  Lab 07/12/21 1818  WBC 8.2  NEUTROABS 6.5  HGB 9.5*  HCT 30.7*  MCV 79.7*  PLT 123456   Basic Metabolic Panel: Recent Labs  Lab 07/12/21 1818  NA 136  K 3.8  CL 104  CO2 26  GLUCOSE 137*  BUN 9  CREATININE 0.63  CALCIUM 8.7*   Liver Function Tests: Recent Labs  Lab 07/12/21 1818  AST 19  ALT 14  ALKPHOS 53  BILITOT 0.3  PROT 7.1  ALBUMIN 3.8   Recent Labs  Lab 07/12/21 1818  LIPASE 36   No results for input(s): AMMONIA in the last 168 hours. Cardiac Enzymes: No results for input(s): CKTOTAL, CKMB, CKMBINDEX, TROPONINI in the last 168 hours. BNP (last 3 results) Recent Labs    07/02/21 1255  BNP 51.6    ProBNP (last 3 results) No results for input(s): PROBNP in the last 8760 hours.  CBG: Recent Labs  Lab 07/13/21 1839 07/13/21 2130 07/14/21 0245 07/14/21 0634 07/14/21 1122  GLUCAP 87 98 120* 137* 124*   Recent Results (from the past 240 hour(s))  Urine Culture     Status: Abnormal   Collection Time: 07/12/21  6:10 PM   Specimen: Urine, Clean Catch  Result Value Ref Range Status   Specimen Description   Final    IN/OUT CATH URINE Performed at [facility], [address] [place], [place] [address]    Special Requests   Final     NONE Performed at [facility], [address] [place], [place] [address]    Culture MULTIPLE SPECIES PRESENT, SUGGEST RECOLLECTION (A)  Final   Report Status 07/14/2021 FINAL  Final  Resp Panel by RT-PCR (Flu A&B, Covid) Nasopharyngeal Swab     Status: Abnormal   Collection Time: 07/12/21  6:18 PM   Specimen: Nasopharyngeal Swab; Nasopharyngeal(NP) swabs in vial transport medium  Result Value Ref Range Status   SARS Coronavirus 2 by RT PCR POSITIVE (A) NEGATIVE Final    Comment: RESULT CALLED TO, READ BACK BY AND VERIFIED WITH:  POWELL,V RN '@1950'$  07/12/21 EDENSCA (NOTE) SARS-CoV-2 target nucleic acids are DETECTED.  The SARS-CoV-2 RNA is generally detectable in upper  respiratory specimens during the acute phase of infection. Positive results are indicative of the presence of the identified virus, but do not rule out bacterial infection or co-infection with other pathogens not detected by the test. Clinical correlation with patient history and other diagnostic information is necessary to determine patient infection status. The expected result is Negative.  Fact Sheet for Patients: EntrepreneurPulse.com.au  Fact Sheet for Healthcare Providers: IncredibleEmployment.be  This test is not yet approved or cleared by the [place] FDA and  has been authorized for detection and/or diagnosis of SARS-CoV-2 by FDA under an Emergency Use Authorization (EUA).  This EUA will remain in effect (meaning this test can b e used) for the duration of  the COVID-19 declaration under Section 564(b)(1) of the Act, 21 U.S.C. section 360bbb-3(b)(1), unless the authorization is terminated or revoked sooner.     Influenza A by PCR NEGATIVE NEGATIVE Final   Influenza B by PCR NEGATIVE NEGATIVE Final    Comment: (NOTE) The Xpert Xpress SARS-CoV-2/FLU/RSV plus assay is intended as an aid in the diagnosis of influenza from Nasopharyngeal swab specimens  and should not be used as a sole basis for treatment. Nasal washings and aspirates are unacceptable for Xpert Xpress SARS-CoV-2/FLU/RSV testing.  Fact Sheet for Patients: EntrepreneurPulse.com.au  Fact Sheet for Healthcare Providers: IncredibleEmployment.be  This test is not yet approved or cleared by the [place] FDA and has been authorized for detection and/or diagnosis of SARS-CoV-2 by FDA under an Emergency Use Authorization (EUA). This EUA will remain in effect (meaning this test can be used) for the duration of the COVID-19 declaration under Section 564(b)(1) of the Act, 21 U.S.C. section 360bbb-3(b)(1), unless the authorization is terminated or revoked.  Performed at [facility], [address] [place], [place] [address]   Blood Culture (routine x 2)     Status: None (Preliminary result)   Collection Time: 07/12/21  6:36 PM   Specimen: BLOOD LEFT ARM  Result Value Ref Range Status   Specimen Description   Final    BLOOD LEFT ARM BLOOD Performed at [facility], [address] [place], [place] [address]    Special Requests   Final    Blood Culture adequate volume BOTTLES DRAWN AEROBIC AND ANAEROBIC Performed at [facility], [address] [place], [place] [address]    Culture   Final    NO GROWTH 2 DAYS Performed at [facility], [place], [place], [place] [address]    Report Status PENDING  Incomplete  Blood Culture (routine x 2)     Status: None (Preliminary result)   Collection Time: 07/12/21  6:37 PM   Specimen: BLOOD LEFT ARM  Result Value Ref Range Status   Specimen Description   Final    BLOOD LEFT ARM BLOOD Performed at [facility], [address] [place], [place] [address]    Special Requests   Final    Blood Culture adequate volume BOTTLES DRAWN AEROBIC AND ANAEROBIC Performed at [facility], [address] [place], [place] [address]     Culture   Final    NO GROWTH 2 DAYS Performed at [facility], [place] [address] [place], [place] [address]    Report Status PENDING  Incomplete     Studies: CT ABDOMEN PELVIS W CONTRAST  Result Date: 07/12/2021 CLINICAL DATA:  Abdomen pain with vomiting EXAM: CT ABDOMEN AND PELVIS WITH CONTRAST TECHNIQUE:  Multidetector CT imaging of the abdomen and pelvis was performed using the standard protocol following bolus administration of intravenous contrast. CONTRAST:  80m OMNIPAQUE IOHEXOL 350 MG/ML SOLN COMPARISON:  CT chest 07/01/2021, CT abdomen pelvis 05/22/2021 FINDINGS: Lower chest: Lung bases demonstrate no acute consolidation or effusion. Borderline cardiomegaly. Mild coronary vascular calcification. Hepatobiliary: No focal liver abnormality is seen. Status post cholecystectomy. No biliary dilatation. Pancreas: Unremarkable. No pancreatic ductal dilatation or surrounding inflammatory changes. Spleen: Normal in size without focal abnormality. Adrenals/Urinary Tract: Adrenal glands are normal. Kidneys show no hydronephrosis. The bladder is normal Stomach/Bowel: The stomach is nonenlarged. No dilated proximal and mid small bowel. Dilated terminal ileum measuring up to 3.5 cm with fecalized appearance, series 2, image 48. Large amount of stool throughout the colon and rectum. No acute bowel wall thickening. Negative appendix Vascular/Lymphatic: Mild aortic atherosclerosis. No aneurysm. No suspicious nodes. Vascular stent in the left iliac vein. Reproductive: Status post hysterectomy. No adnexal masses. Other: Negative for free air or free fluid Musculoskeletal: No acute or significant osseous findings. IMPRESSION: 1. Dilated segment of terminal ileum with fecalized appearance of the terminal ileum to the level of ileocecal valve, with large amount of stool throughout the colon and rectum, findings could be secondary to slow transit, though partial obstruction at the level of ileocecal valve is also  possible. 2. Otherwise no CT evidence for acute intra-abdominal or pelvic abnormality Electronically Signed   By: KDonavan FoilM.D.   On: 07/12/2021 20:10   DG Chest Port 1 View  Result Date: 07/12/2021 CLINICAL DATA:  Vomiting EXAM: PORTABLE CHEST 1 VIEW COMPARISON:  07/01/2021 FINDINGS: Right shoulder replacement. No focal opacity or pleural effusion. Borderline cardiomegaly. Aortic atherosclerosis. No pneumothorax IMPRESSION: No active disease.  Borderline cardiomegaly Electronically Signed   By: KDonavan FoilM.D.   On: 07/12/2021 19:36   ECHOCARDIOGRAM LIMITED  Result Date: 07/14/2021    ECHOCARDIOGRAM LIMITED REPORT   Patient Name:   Diane MEARLENA SANKARDate of Exam: 07/14/2021 Medical Rec #:  [id number]     Height:       68.0 in Accession #:    [id number]    Weight:       201.1 lb Date of Birth:  6Dec 05, 1947     BSA:          2.048 m Patient Age:    89years       BP:           140/68 mmHg Patient Gender: F              HR:           85 bpm. Exam Location:  Inpatient Procedure: Limited Echo, Limited Color Doppler and Cardiac Doppler Indications:    R07.9* Chest pain, unspecified  History:        Patient has prior history of Echocardiogram examinations, most                 recent 07/02/2021. Risk Factors:Diabetes and Hypertension.  Sonographer:    SBernadene PersonRDCS Referring Phys: [id number] 1. Left ventricular ejection fraction, by estimation, is 60 to 65%. The left ventricle has normal function. The left ventricle has no regional wall motion abnormalities. Left ventricular diastolic parameters are consistent with Grade I diastolic dysfunction (impaired relaxation).  2. Right ventricular systolic function is normal. The right ventricular size is normal. There is normal pulmonary artery systolic pressure.  3. The mitral valve is normal in structure.  No evidence of mitral valve regurgitation. No evidence of mitral stenosis.  4. The aortic valve is tricuspid. There is mild  calcification of the aortic valve. There is mild thickening of the aortic valve. Aortic valve regurgitation is not visualized. Mild aortic valve sclerosis is present, with no evidence of aortic valve stenosis.  5. Aortic dilatation noted. There is borderline dilatation of the ascending aorta, measuring 36 mm.  6. The inferior vena cava is normal in size with greater than 50% respiratory variability, suggesting right atrial pressure of 3 mmHg. FINDINGS  Left Ventricle: Left ventricular ejection fraction, by estimation, is 60 to 65%. The left ventricle has normal function. The left ventricle has no regional wall motion abnormalities. The left ventricular internal cavity size was normal in size. There is  no left ventricular hypertrophy. Left ventricular diastolic parameters are consistent with Grade I diastolic dysfunction (impaired relaxation). Normal left ventricular filling pressure. Right Ventricle: The right ventricular size is normal. No increase in right ventricular wall thickness. Right ventricular systolic function is normal. There is normal pulmonary artery systolic pressure. The tricuspid regurgitant velocity is 2.43 m/s, and  with an assumed right atrial pressure of 3 mmHg, the estimated right ventricular systolic pressure is 123XX123 mmHg. Mitral Valve: The mitral valve is normal in structure. Mild mitral annular calcification. No evidence of mitral valve stenosis. Tricuspid Valve: The tricuspid valve is normal in structure. Tricuspid valve regurgitation is trivial. No evidence of tricuspid stenosis. Aortic Valve: The aortic valve is tricuspid. There is mild calcification of the aortic valve. There is mild thickening of the aortic valve. Aortic valve regurgitation is not visualized. Mild aortic valve sclerosis is present, with no evidence of aortic valve stenosis. Pulmonic Valve: The pulmonic valve was normal in structure. Pulmonic valve regurgitation is not visualized. No evidence of pulmonic stenosis.  Aorta: Aortic dilatation noted. There is borderline dilatation of the ascending aorta, measuring 36 mm. Venous: The inferior vena cava is normal in size with greater than 50% respiratory variability, suggesting right atrial pressure of 3 mmHg. LEFT VENTRICLE PLAX 2D LVIDd:         4.90 cm  Diastology LVIDs:         3.00 cm  LV e' medial:    8.73 cm/s LV PW:         1.00 cm  LV E/e' medial:  7.2 LV IVS:        0.90 cm  LV e' lateral:   12.70 cm/s LVOT diam:     2.10 cm  LV E/e' lateral: 5.0 LV SV:         67 LV SV Index:   33 LVOT Area:     3.46 cm  RIGHT VENTRICLE RV S prime:     13.60 cm/s TAPSE (M-mode): 2.2 cm LEFT ATRIUM         Index LA diam:    2.90 cm 1.42 cm/m  AORTIC VALVE LVOT Vmax:   133.00 cm/s LVOT Vmean:  84.500 cm/s LVOT VTI:    0.194 m  AORTA Ao Root diam: 3.10 cm Ao Asc diam:  3.60 cm MITRAL VALVE                TRICUSPID VALVE MV Area (PHT): 3.50 cm     TR Peak grad:   23.6 mmHg MV Decel Time: 217 msec     TR Vmax:        243.00 cm/s MV E velocity: 63.10 cm/s MV A velocity: 113.00 cm/s  SHUNTS MV  E/A ratio:  0.56         Systemic VTI:  0.19 m                             Systemic Diam: 2.10 cm Skeet Latch MD Electronically signed by Skeet Latch MD Signature Date/Time: 07/14/2021/12:26:53 PM    Final       Flora Lipps, MD  [facility] 07/14/2021  If 7PM-7AM, please contact night-coverage

## 2021-07-14 NOTE — Progress Notes (Signed)
[facility]ST initially consulted for midline placement due to need for lab work. Discussed with primary RN and Dr. Louanne Belton and recommendation for PICC/central access for the purpose of labs. Per MD okay to hold off for now, phlebotomy to attempt again when able tomorrow, then MD to pace PICC if indicated at that time.

## 2021-07-14 NOTE — Progress Notes (Signed)
Cardiology Progress Note  Patient ID: Diane [name] MRN: [id number] DOB: 12-17-1945 Date of Encounter: 07/14/2021  Primary Cardiologist: None  Subjective   Chief Complaint: Abdominal pain.  HPI: Admitted with abdominal pain and COVID-19 infection.  CT scan with significant stool burden.  She reports she was hurting her chest for 1 she points to the region it is the epigastric area.  EKG normal.  Bedside echo performed during my examination shows normal wall motion and normal LVEF.  ROS:  All other ROS reviewed and negative. Pertinent positives noted in the HPI.     Inpatient Medications  Scheduled Meds:  apixaban  5 mg Oral BID   aspirin EC  81 mg Oral Daily   bebtelovimab EUA  175 mg Intravenous Once   docusate sodium  100 mg Oral BID   DULoxetine  20 mg Oral q morning   folic acid  1 mg Oral Daily   insulin aspart  0-6 Units Subcutaneous TID WC   pantoprazole  40 mg Oral BID   polyethylene glycol  17 g Oral BID   rosuvastatin  10 mg Oral Daily   Continuous Infusions:  lactated ringers 100 mL/hr at 07/14/21 0811   PRN Meds: acetaminophen **OR** acetaminophen, acetaminophen, bisacodyl, simethicone   Vital Signs   Vitals:   07/14/21 0000 07/14/21 0400 07/14/21 0800 07/14/21 0826  BP: 132/70 133/66 140/68   Pulse: 91 88 81 88  Resp: '14 17 12 13  '$ Temp: (!) 100.9 F (38.3 C) 97.8 F (36.6 C) 99.2 F (37.3 C) 98.2 F (36.8 C)  TempSrc: Oral Oral Oral Oral  SpO2: 94% 94% 95% 99%  Weight: 91.2 kg     Height:        Intake/Output Summary (Last 24 hours) at 07/14/2021 0957 Last data filed at 07/14/2021 0900 Gross per 24 hour  Intake 1146.1 ml  Output --  Net 1146.1 ml   Last 3 Weights 07/14/2021 07/13/2021 07/12/2021  Weight (lbs) 201 lb 1 oz 202 lb 9.6 oz 206 lb  Weight (kg) 91.2 kg 91.9 kg 93.441 kg      Telemetry  Overnight telemetry shows sinus rhythm in the 90s, which I personally reviewed.   ECG  The most recent ECG shows sinus rhythm heart rate 85, no  acute ischemic changes or evidence of infarction, which I personally reviewed.   Physical Exam   Vitals:   07/14/21 0000 07/14/21 0400 07/14/21 0800 07/14/21 0826  BP: 132/70 133/66 140/68   Pulse: 91 88 81 88  Resp: '14 17 12 13  '$ Temp: (!) 100.9 F (38.3 C) 97.8 F (36.6 C) 99.2 F (37.3 C) 98.2 F (36.8 C)  TempSrc: Oral Oral Oral Oral  SpO2: 94% 94% 95% 99%  Weight: 91.2 kg     Height:        Intake/Output Summary (Last 24 hours) at 07/14/2021 0957 Last data filed at 07/14/2021 0900 Gross per 24 hour  Intake 1146.1 ml  Output --  Net 1146.1 ml    Last 3 Weights 07/14/2021 07/13/2021 07/12/2021  Weight (lbs) 201 lb 1 oz 202 lb 9.6 oz 206 lb  Weight (kg) 91.2 kg 91.9 kg 93.441 kg    Body mass index is 30.57 kg/m.  General: Well nourished, well developed, in no acute distress Head: Atraumatic, normal size  Eyes: PEERLA, EOMI  Neck: Supple, no JVD Endocrine: No thryomegaly Cardiac: Normal S1, S2; RRR; no murmurs, rubs, or gallops Lungs: Clear to auscultation bilaterally, no wheezing, rhonchi or rales  Abd: Distended abdomen, tender to palpation Ext: No edema, pulses 2+ Musculoskeletal: No deformities, BUE and BLE strength normal and equal Skin: Warm and dry, no rashes   Neuro: Alert and oriented to person, place, time, and situation, CNII-XII grossly intact, no focal deficits  Psych: Normal mood and affect   Labs  High Sensitivity Troponin:   Recent Labs  Lab 07/12/21 1818 07/12/21 2205 07/13/21 0006 07/13/21 0207 07/13/21 0430  TROPONINIHS 5 283* 1,553* 1,866* 1,774*     Cardiac EnzymesNo results for input(s): TROPONINI in the last 168 hours. No results for input(s): TROPIPOC in the last 168 hours.  Chemistry Recent Labs  Lab 07/12/21 1818  NA 136  K 3.8  CL 104  CO2 26  GLUCOSE 137*  BUN 9  CREATININE 0.63  CALCIUM 8.7*  PROT 7.1  ALBUMIN 3.8  AST 19  ALT 14  ALKPHOS 53  BILITOT 0.3  GFRNONAA >60  ANIONGAP 6    Hematology Recent Labs  Lab  07/12/21 1818  WBC 8.2  RBC 3.85*  HGB 9.5*  HCT 30.7*  MCV 79.7*  MCH 24.7*  MCHC 30.9  RDW 17.4*  PLT 255   BNPNo results for input(s): BNP, PROBNP in the last 168 hours.  DDimer No results for input(s): DDIMER in the last 168 hours.   Radiology  CT ABDOMEN PELVIS W CONTRAST  Result Date: 07/12/2021 CLINICAL DATA:  Abdomen pain with vomiting EXAM: CT ABDOMEN AND PELVIS WITH CONTRAST TECHNIQUE: Multidetector CT imaging of the abdomen and pelvis was performed using the standard protocol following bolus administration of intravenous contrast. CONTRAST:  60m OMNIPAQUE IOHEXOL 350 MG/ML SOLN COMPARISON:  CT chest 07/01/2021, CT abdomen pelvis 05/22/2021 FINDINGS: Lower chest: Lung bases demonstrate no acute consolidation or effusion. Borderline cardiomegaly. Mild coronary vascular calcification. Hepatobiliary: No focal liver abnormality is seen. Status post cholecystectomy. No biliary dilatation. Pancreas: Unremarkable. No pancreatic ductal dilatation or surrounding inflammatory changes. Spleen: Normal in size without focal abnormality. Adrenals/Urinary Tract: Adrenal glands are normal. Kidneys show no hydronephrosis. The bladder is normal Stomach/Bowel: The stomach is nonenlarged. No dilated proximal and mid small bowel. Dilated terminal ileum measuring up to 3.5 cm with fecalized appearance, series 2, image 48. Large amount of stool throughout the colon and rectum. No acute bowel wall thickening. Negative appendix Vascular/Lymphatic: Mild aortic atherosclerosis. No aneurysm. No suspicious nodes. Vascular stent in the left iliac vein. Reproductive: Status post hysterectomy. No adnexal masses. Other: Negative for free air or free fluid Musculoskeletal: No acute or significant osseous findings. IMPRESSION: 1. Dilated segment of terminal ileum with fecalized appearance of the terminal ileum to the level of ileocecal valve, with large amount of stool throughout the colon and rectum, findings could be  secondary to slow transit, though partial obstruction at the level of ileocecal valve is also possible. 2. Otherwise no CT evidence for acute intra-abdominal or pelvic abnormality Electronically Signed   By: KDonavan FoilM.D.   On: 07/12/2021 20:10   DG Chest Port 1 View  Result Date: 07/12/2021 CLINICAL DATA:  Vomiting EXAM: PORTABLE CHEST 1 VIEW COMPARISON:  07/01/2021 FINDINGS: Right shoulder replacement. No focal opacity or pleural effusion. Borderline cardiomegaly. Aortic atherosclerosis. No pneumothorax IMPRESSION: No active disease.  Borderline cardiomegaly Electronically Signed   By: KDonavan FoilM.D.   On: 07/12/2021 19:36    Cardiac Studies  TTE 07/02/2021  1. Left ventricular ejection fraction, by estimation, is 60 to 65%. The  left ventricle has normal function. The left ventricle has no regional  wall motion abnormalities. There is mild left ventricular hypertrophy.  Left ventricular diastolic parameters  are consistent with Grade I diastolic dysfunction (impaired relaxation).   2. Right ventricular systolic function is normal. The right ventricular  size is normal. There is normal pulmonary artery systolic pressure. The  estimated right ventricular systolic pressure is [id number] mmHg.   3. Left atrial size was moderately dilated.   4. The mitral valve is normal in structure. Mild mitral valve  regurgitation. No evidence of mitral stenosis.   5. The aortic valve is tricuspid. Aortic valve regurgitation is not  visualized. Mild aortic valve sclerosis is present, with no evidence of  aortic valve stenosis.   6. The inferior vena cava is normal in size with greater than 50%  respiratory variability, suggesting right atrial pressure of 3 mmHg.   CCTA 07/02/2021 1. Coronary calcium score of 303. This was 85th percentile for age-, sex, and race-matched controls.   2. Normal coronary origin with right dominance.   3. Non-obstructive CAD (<25%) in the LAD/LCX/RCA.  LHC 04/21/2017 The  left ventricular systolic function is normal. LV end diastolic pressure is normal. The left ventricular ejection fraction is 55-65% by visual estimate.  Patient Profile  Diane [name] is a 75 y.o. female with hypertension, diabetes, obesity, recurrent DVT, recurrent small bowel obstruction, nonobstructive CAD who was admitted on 07/13/2021 with concerns for partial small bowel obstruction and abdominal pain.  She also has COVID-19 infection.  Cardiology was consulted for elevated troponin in the setting of abdominal pain.  Assessment & Plan   #Nonmyocardial infarction troponin elevation versus myocardial infarction with no obstructive coronary artery disease (MINOCA) -Recent admission to the hospital for chest pain.  Coronary CTA was unremarkable with minimal CAD.  Left heart cath in 2018 was normal.  Echo also was normal during her last admission. -She is now admitted with partial small bowel obstruction COVID-19 infection.  Troponins are minimally elevated and flat.  Her EKG is nonischemic.  Telemetry is unremarkable.  Her symptoms are epigastric pain.  This could either represent nonmyocardial infarction troponin elevation in setting of sepsis versus MINOCA.  She has no evidence of epicardial obstructive stenoses on recent coronary CTA.  I do not suspect an acute coronary syndrome. -Her bedside echocardiogram was being performed during my interview.  Echo shows normal LV function and normal wall motion. -For now we will discontinue aspirin and statin.  She can continue her Eliquis.  She has no symptoms concerning for pericarditis or myocarditis.  Would recommend just to continue her blood pressure medications as well. -No plans for invasive angiography.  For questions or updates, please contact [place] Please consult [email or web address] for contact info under   Time Spent with Patient: I have spent a total of 35 minutes with patient reviewing hospital notes, telemetry, EKGs, labs and examining  the patient as well as establishing an assessment and plan that was discussed with the patient.  > 50% of time was spent in direct patient care.    Signed, Addison Naegeli. Audie Box, MD, [place]  07/14/2021 9:57 AM

## 2021-07-14 NOTE — Evaluation (Signed)
Physical Therapy Evaluation Patient Details Name: Diane [name] MRN: [id number] DOB: 27-Sep-1946 Today's Date: 07/14/2021  History of Present Illness  Pt presented to ED 9/11 with abdominal pain. Pt found to be covid +. Surgery felt abdominal pain due to severe constipation. Cardiology consulted for elevated troponin and recommend no invasive angiography.  PMH - HTN, DM, , anxiety, depression, SBO, rheumatoid arthritis, DVT, rt shoulder replacement  Clinical Impression  Pt presents to PT with decr mobility due to illness, pain, and inactivity. Expect pt will make good progress back to baseline with mobility. Recommend HHPT at time of dc.        Recommendations for follow up therapy are one component of a multi-disciplinary discharge planning process, led by the attending physician.  Recommendations may be updated based on patient status, additional functional criteria and insurance authorization.  Follow Up Recommendations Home health PT;Supervision - Intermittent    Equipment Recommendations  None recommended by PT    Recommendations for Other Services       Precautions / Restrictions Precautions Precautions: Other (comment) Precaution Comments: watch HR      Mobility  Bed Mobility Overal bed mobility: Needs Assistance Bed Mobility: Supine to Sit     Supine to sit: Min assist     General bed mobility comments: Assist to pull trunk up into sitting due to abd pain    Transfers Overall transfer level: Needs assistance Equipment used: Rolling walker (2 wheeled);None Transfers: Sit to/from [facility]   Stand pivot transfers: Min guard       General transfer comment: Assist for safety and incr time to rise. Bed to [facility] without assistive device  Ambulation/Gait Ambulation/Gait assistance: Min guard Gait Distance (Feet): 20 Feet Assistive device: Rolling walker (2 wheeled) Gait Pattern/deviations: Step-through pattern;Decreased step length -  right;Decreased step length - left;Trunk flexed Gait velocity: decr Gait velocity interpretation: <1.31 ft/sec, indicative of household ambulator General Gait Details: Slow gait without overt loss of balance. Distance limited by abd pain.  Stairs            Wheelchair Mobility    Modified Rankin (Stroke Patients Only)       Balance Overall balance assessment: Needs assistance Sitting-balance support: No upper extremity supported;Feet unsupported Sitting balance-Leahy Scale: Good     Standing balance support: No upper extremity supported Standing balance-Leahy Scale: Fair                               Pertinent Vitals/Pain Pain Assessment: Faces Faces Pain Scale: Hurts even more Pain Location: abdomen Pain Descriptors / Indicators: Cramping Pain Intervention(s): Limited activity within patient's tolerance;Repositioned;Monitored during session    Home Living Family/patient expects to be discharged to:: Private residence Living Arrangements: Alone Available Help at Discharge: Family;Available PRN/intermittently Type of Home: House Home Access: Stairs to enter Entrance Stairs-Rails: None Entrance Stairs-Number of Steps: 2 Home Layout: Two level;Able to live on main level with bedroom/bathroom Home Equipment: Gilford Rile - 2 wheels;Shower seat Additional Comments: family brings food, has cleaning service and can drive at times    Prior Function Level of Independence: Needs assistance   Gait / Transfers Assistance Needed: Modified independent with ambulation using walker  ADL's / Homemaking Assistance Needed: Reports daughter assists with bathing and dressing as needed.        Hand Dominance        Extremity/Trunk Assessment   Upper Extremity Assessment Upper Extremity Assessment: Overall WFL for tasks  assessed    Lower Extremity Assessment Lower Extremity Assessment: Generalized weakness       Communication   Communication: No difficulties   Cognition Arousal/Alertness: Awake/alert Behavior During Therapy: WFL for tasks assessed/performed Overall Cognitive Status: Within Functional Limits for tasks assessed                                        General Comments General comments (skin integrity, edema, etc.): HR to 120 with amb. SpO2 >95% on RA.    Exercises     Assessment/Plan    PT Assessment Patient needs continued PT services  PT Problem List Decreased strength;Decreased balance;Decreased mobility;Pain;Decreased activity tolerance       PT Treatment Interventions DME instruction;Functional mobility training;Balance training;Patient/family education;Gait training;Therapeutic activities;Stair training;Therapeutic exercise    PT Goals (Current goals can be found in the Care Plan section)  Acute Rehab PT Goals Patient Stated Goal: decr abominal pain PT Goal Formulation: With patient Time For Goal Achievement: 07/28/21 Potential to Achieve Goals: Good    Frequency Min 3X/week   Barriers to discharge Decreased caregiver support lives alone    Co-evaluation               AM-PAC PT "6 Clicks" Mobility  Outcome Measure Help needed turning from your back to your side while in a flat bed without using bedrails?: None Help needed moving from lying on your back to sitting on the side of a flat bed without using bedrails?: A Little Help needed moving to and from a bed to a chair (including a wheelchair)?: A Little Help needed standing up from a chair using your arms (e.g., wheelchair or bedside chair)?: A Little Help needed to walk in hospital room?: A Little Help needed climbing 3-5 steps with a railing? : A Little 6 Click Score: 19    End of Session   Activity Tolerance: Patient limited by pain Patient left: in bed;with call bell/phone within reach (sitting EOB) Nurse Communication: Mobility status;Other (comment) (urine in bsc for sample) PT Visit Diagnosis: Other abnormalities of gait  and mobility (R26.89);Muscle weakness (generalized) (M62.81)    Time: [id number] PT Time Calculation (min) (ACUTE ONLY): 21 min   Charges:   PT Evaluation $PT Eval Moderate Complexity: [place] Pager [phone] Office [place] 07/14/2021, 2:15 PM
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
## 2021-07-14 NOTE — Progress Notes (Signed)
  Echocardiogram 2D Echocardiogram has been performed.  [name] 07/14/2021, 10:26 AM

## 2021-07-14 NOTE — Progress Notes (Addendum)
Phebotomist Otila Kluver) unable to draw labs x3 so RN asked Otila Kluver to have some else try please. Per Otila Kluver lab tech can't draw lab post 24 hours after 3 tries. MD notified and suggested  Midline. RN consulted IV team for midline for lab at this time.

## 2021-07-14 NOTE — Progress Notes (Signed)
Patient had a total of 3BMs during shift after sup soap enema, (2 BMs with RN and 1 BM with NT).

## 2021-07-14 NOTE — Consult Note (Signed)
   [facility] CM Inpatient Consult   07/14/2021  Nakeema Leite [facility] 1946/01/06 [id number]  [facility] [ACO] Patient: [employer]  Patient is currently active with [facility] for chronic disease management services.  Patient has had outreach attempts by a [place] Coordinator.  Patient currently on airborne precaution due to COVID-19 positive.  Plan:  Will follow with the Inpatient Transition Of Care [TOC] team member to make aware that [facility] following.   Of note, [facility] Care Management services does not replace or interfere with any services that are needed or arranged by inpatient [facility] care management team.  For additional questions or referrals please contact:  Natividad Brood, RN BSN [facility] Liaison  [phone] business mobile phone Toll free office [phone]  Fax number: [phone] [email or web address]

## 2021-07-15 ENCOUNTER — Other Ambulatory Visit (HOSPITAL_COMMUNITY): Payer: Self-pay

## 2021-07-15 LAB — GLUCOSE, CAPILLARY: Glucose-Capillary: 91 mg/dL (ref 70–99)

## 2021-07-15 LAB — COMPREHENSIVE METABOLIC PANEL
ALT: 12 U/L (ref 0–44)
AST: 18 U/L (ref 15–41)
Albumin: 2.9 g/dL — ABNORMAL LOW (ref 3.5–5.0)
Alkaline Phosphatase: 47 U/L (ref 38–126)
Anion gap: 15 (ref 5–15)
BUN: 8 mg/dL (ref 8–23)
CO2: 23 mmol/L (ref 22–32)
Calcium: 8.6 mg/dL — ABNORMAL LOW (ref 8.9–10.3)
Chloride: 101 mmol/L (ref 98–111)
Creatinine, Ser: 0.51 mg/dL (ref 0.44–1.00)
GFR, Estimated: >60 mL/min (ref >60)
Glucose, Bld: 95 mg/dL (ref 70–99)
Potassium: 3.2 mmol/L — ABNORMAL LOW (ref 3.5–5.1)
Sodium: 139 mmol/L (ref 135–145)
Total Bilirubin: 0.7 mg/dL (ref 0.3–1.2)
Total Protein: 5.9 g/dL — ABNORMAL LOW (ref 6.5–8.1)

## 2021-07-15 LAB — CBC
HCT: 30.4 % — ABNORMAL LOW (ref 36.0–46.0)
Hemoglobin: 9.7 g/dL — ABNORMAL LOW (ref 12.0–15.0)
MCH: 25.1 pg — ABNORMAL LOW (ref 26.0–34.0)
MCHC: 31.9 g/dL (ref 30.0–36.0)
MCV: 78.8 fL — ABNORMAL LOW (ref 80.0–100.0)
Platelets: 223 10*3/uL (ref 150–400)
RBC: 3.86 MIL/uL — ABNORMAL LOW (ref 3.87–5.11)
RDW: 17.4 % — ABNORMAL HIGH (ref 11.5–15.5)
WBC: 5.1 10*3/uL (ref 4.0–10.5)
nRBC: 0 % (ref 0.0–0.2)

## 2021-07-15 LAB — SEDIMENTATION RATE: Sed Rate: 15 mm/hr (ref 0–22)

## 2021-07-15 LAB — C-REACTIVE PROTEIN: CRP: 0.8 mg/dL (ref <1.0)

## 2021-07-15 LAB — LIPOPROTEIN A (LPA): Lipoprotein (a): 119.7 nmol/L — ABNORMAL HIGH (ref <75.0)

## 2021-07-15 MED ORDER — POTASSIUM CHLORIDE CRYS ER 20 MEQ PO TBCR
40.0000 meq | EXTENDED_RELEASE_TABLET | ORAL | Status: DC
  Administered 2021-07-15: 40 meq via ORAL
  Filled 2021-07-15: qty 2

## 2021-07-15 MED ORDER — MOLNUPIRAVIR 200 MG PO CAPS
4.0000 | ORAL_CAPSULE | Freq: Two times a day (BID) | ORAL | 0 refills | Status: AC
  Filled 2021-07-15: qty 40, 5d supply, fill #0

## 2021-07-15 MED ORDER — MOLNUPIRAVIR EUA 200MG CAPSULE
4.0000 | ORAL_CAPSULE | Freq: Two times a day (BID) | ORAL | Status: DC
  Filled 2021-07-15 (×2): qty 4

## 2021-07-15 MED ORDER — NIRMATRELVIR/RITONAVIR (PAXLOVID)TABLET: 3.0000 | ORAL_TABLET | Freq: Two times a day (BID) | ORAL | Status: DC

## 2021-07-15 MED ORDER — NIRMATRELVIR&RITONAVIR 300/100 20 X 150 MG & 10 X 100MG PO TBPK
3.0000 | ORAL_TABLET | Freq: Two times a day (BID) | ORAL | 0 refills | Status: DC
  Filled 2021-07-15: qty 30, 5d supply, fill #0

## 2021-07-15 MED ORDER — SALINE SPRAY 0.65 % NA SOLN
1.0000 | NASAL | Status: DC | PRN
  Filled 2021-07-15: qty 44

## 2021-07-15 NOTE — TOC Transition Note (Signed)
Transition of Care [facility]) - CM/SW Discharge Note   Patient Details  Name: Diane [name] MRN: [id number] Date of Birth: Jan 09, 1946  [facility] CM/SW Contact:  Zenon Mayo, RN Phone Number: 07/15/2021, 11:29 AM   Clinical Narrative:    Patient is set up with [facility] for [place].  She is for dc today, she has no other needs.   Final next level of care: [place] Barriers to Discharge: No Barriers Identified   Patient Goals and CMS Choice Patient states their goals for this hospitalization and ongoing recovery are:: return home with [facility] CMS Medicare.gov Compare Post Acute Care list provided to:: Patient Choice offered to / list presented to : Patient  Discharge Placement                       Discharge Plan and Services   Discharge Planning Services: CM Consult Post Acute Care Choice: Home Health            DME Agency: NA       HH Arranged: PT HH Agency: [employer] Date [facility] Agency Contacted: 07/15/21 Time [place]: 1036 Representative spoke with at [place]: [place] ([place]) Interventions     Readmission Risk Interventions Readmission Risk Prevention Plan 07/15/2021  Transportation Screening Complete  Medication Review Press photographer) Complete  PCP or Specialist appointment within 3-5 days of discharge Complete  HRI or [place] Complete  SW Recovery Care/Counseling Consult Complete  [place] Not Applicable  Some recent data might be hidden

## 2021-07-15 NOTE — Discharge Summary (Signed)
Physician Discharge Summary  Diane [name] [id number] DOB: August 10, 1946 DOA: 07/12/2021  PCP: [name], MD  Admit date: 07/12/2021 Discharge date: 07/15/2021 30 Day Unplanned Readmission Risk Score    Flowsheet Row ED to Hosp-Admission (Current) from 07/12/2021 in [facility]  30 Day Unplanned Readmission Risk Score (%) 30.68 Filed at 07/15/2021 0801       This score is the patient's risk of an unplanned readmission within 30 days of being discharged (0 -100%). The score is based on dignosis, age, lab data, medications, orders, and past utilization.   Low:  0-14.9   Medium: 15-21.9   High: 22-29.9   Extreme: 30 and above          Admitted From: Home Disposition: Home  Recommendations for Outpatient Follow-up:  Follow up with PCP in 1-2 weeks Please obtain BMP/CBC in one week Follow up with cardiology in 6 to 8 weeks Please follow up with your PCP on the following pending results: Unresulted Labs (From admission, onward)     Start     Ordered   07/15/21 0500  Lipoprotein A (LPA)  Once,   R        07/15/21 0500   07/13/21 1829  Ferritin  Once,   R        07/13/21 1828   07/13/21 1829  Iron and TIBC  Once,   R        07/13/21 1828   07/13/21 1825  Lipid panel  Once,   R        07/13/21 1824   07/13/21 1825  TSH  Once,   R        07/13/21 1824   07/13/21 1825  Hemoglobin A1c  Once,   R        07/13/21 1824   07/13/21 1824  CBC  Once,   R        07/13/21 1823   07/13/21 1824  Comprehensive metabolic panel  Once,   R        07/13/21 1823   07/13/21 1823  Magnesium  Once,   R        07/13/21 [place]: None Equipment/Devices: None  Discharge Condition: Stable CODE STATUS: Full code Diet recommendation: Cardiac  Subjective: Seen and examined.  She has no complaints.  She has had multiple bowel movements.  She is feeling good to go home.  Brief/Interim Summary: Diane [name] is a 75 y.o. female with a pertinent  history of type 2 diabetes, hypertension, recent admission for chest pain 8/31, prior recent admission to for small bowel obstruction presented to the hospital with abdominal pain, nausea vomiting and was noted to be febrile as well.  Patient was concerned that she had small bowel obstruction again.  In the ED, patient was also noted to have elevated troponin.  Cardiology was consulted.  EKG showed borderline prolonged QTC.  CT scan of the abdomen showed large amount of stool throughout the colon and rectum, findings could be secondary to slow transit, though partial obstruction at the level of ileocecal valve is also possible.  Patient was then admitted hospital for further evaluation and treatment.  Seen by general surgery.  General surgery opined that she did not have any bowel obstruction and states she has just constipation and they started her on aggressive bowel regimen patient has had multiple bowel movements and she has no  further symptoms at this point in time.  She has been cleared by general surgery for discharge.  For her elevated troponin, she was seen by cardiology.  Cardiology opined that her elevated troponins were secondary to demand ischemia since echocardiogram showed normal ejection fraction with no wall motion abnormalities.  During her recent hospitalization, coronary CTA showed minimal CAD and no epicardial stenosis.  Left heart cath in 2018 was normal as well. Albeit, her troponins were 283> 1553> 1866> 1774.  She is cleared by cardiology as well and patient is feeling well enough to go home.  Her potassium was 3.2 today.  She will receive potassium replacement before discharge. Of note, patient was incidentally found positive for COVID-19.  She was started on Lagervio and will continue for total of 5 days.  She did not have any respiratory symptoms.  Discharge Diagnoses:  Active Problems:   Hypertension   Rheumatoid arthritis ([place])   Constipation    Discharge  Instructions   Allergies as of 07/15/2021       Reactions   Cephalexin Other (See Comments)   URINARY RETENTION = "blocked my urine"   Tapentadol    Other reaction(s): zombie Other reaction(s): zombie   Codeine Hives, Rash   Nucynta [tapentadol Hcl] Other (See Comments)   Altered mental status        Medication List     TAKE these medications    amLODipine 5 MG tablet Commonly known as: NORVASC Take 5 mg by mouth daily.   apixaban 5 MG Tabs tablet Commonly known as: ELIQUIS Take 5 mg by mouth 2 (two) times daily.   aspirin EC 81 MG tablet Take 81 mg by mouth daily. Swallow whole.   docusate sodium 100 MG capsule Commonly known as: COLACE Take 1 capsule (100 mg total) by mouth 2 (two) times daily.   DULoxetine 20 MG capsule Commonly known as: CYMBALTA Take 20 mg by mouth every morning.   folic acid 1 MG tablet Commonly known as: FOLVITE Take 1 tablet (1 mg total) by mouth daily.   gabapentin 300 MG capsule Commonly known as: NEURONTIN Take 300 mg by mouth daily.   Lagevrio 200 MG Caps capsule Generic drug: molnupiravir EUA Take 4 capsules (800 mg total) by mouth 2 (two) times daily for 5 days.   losartan 100 MG tablet Commonly known as: COZAAR Take 100 mg by mouth daily. What changed: Another medication with the same name was removed. Continue taking this medication, and follow the directions you see here.   metFORMIN 500 MG 24 hr tablet Commonly known as: GLUCOPHAGE-XR Take 1,000 mg by mouth daily with breakfast.   methotrexate 2.5 MG tablet Commonly known as: RHEUMATREX Take 8 tablets (20 mg total) by mouth once a week. 8 tablets weekly (Sunday)   naproxen sodium 220 MG tablet Commonly known as: ALEVE Take 440 mg by mouth daily as needed (pain).   pantoprazole 40 MG tablet Commonly known as: PROTONIX Take 1 tablet (40 mg total) by mouth daily.   polyethylene glycol 17 g packet Commonly known as: MIRALAX / GLYCOLAX Take 17 g by mouth 2 (two)  times daily.   rosuvastatin 10 MG tablet Commonly known as: CRESTOR Take 1 tablet (10 mg total) by mouth daily.   simethicone 80 MG chewable tablet Commonly known as: MYLICON Chew 1 tablet (80 mg total) by mouth 4 (four) times daily.   VITAMIN C ADULT GUMMIES PO Take 2 tablets by mouth daily.  Follow-up Information     [name], MD. Schedule an appointment as soon as possible for a visit on 07/15/2021.   Specialty: Internal Medicine Why: Dr.Office will call to make appointment with patient Contact information: [address] [place] [address] [phone]         Geralynn Rile, MD .   Specialties: Cardiology, Internal Medicine, Radiology Contact information: [place] [address] [phone]                Allergies  Allergen Reactions   Cephalexin Other (See Comments)    URINARY RETENTION = "blocked my urine"   Tapentadol     Other reaction(s): zombie Other reaction(s): zombie   Codeine Hives and Rash   Nucynta [Tapentadol Hcl] Other (See Comments)    Altered mental status    Consultations: Cardiology and general surgery   Procedures/Studies: DG Chest 2 View  Result Date: 07/01/2021 CLINICAL DATA:  Chest pain EXAM: CHEST - 2 VIEW COMPARISON:  Chest radiograph 05/30/2021 FINDINGS: The cardiomediastinal silhouette is within normal limits. There is no focal consolidation or pulmonary edema. There is no pleural effusion or pneumothorax. Right shoulder arthroplasty hardware is again noted. There is no acute osseous abnormality. IMPRESSION: No radiographic evidence of acute cardiopulmonary process. Electronically Signed   By: Valetta Mole M.D.   On: 07/01/2021 16:38   CT Angio Chest PE W/Cm &/Or Wo Cm  Result Date: 07/01/2021 CLINICAL DATA:  Chest pain.  PE suspected, high prob EXAM: CT ANGIOGRAPHY CHEST WITH CONTRAST TECHNIQUE: Multidetector CT imaging of the chest was performed using the  standard protocol during bolus administration of intravenous contrast. Multiplanar CT image reconstructions and MIPs were obtained to evaluate the vascular anatomy. Repeat intravenous injection was administered due to poor timing of intravenous contrast on the first images. CONTRAST:  166m OMNIPAQUE IOHEXOL 350 MG/ML SOLN COMPARISON:  CT angiography chest 05/31/2021 FINDINGS: Cardiovascular: Satisfactory opacification of the pulmonary arteries to the segmental level. No evidence of pulmonary embolism. Normal heart size. No significant pericardial effusion. The thoracic aorta is normal in caliber. Mild atherosclerotic plaque of the thoracic aorta. Likely three-vessel coronary artery calcifications. Tiny hiatal hernia. Mediastinum/Nodes: No enlarged mediastinal, hilar, or axillary lymph nodes. Thyroid gland, trachea, and esophagus demonstrate no significant findings. Lungs/Pleura: No focal consolidation. No pulmonary nodule. No pulmonary mass. No pleural effusion. No pneumothorax. Upper Abdomen: No acute abnormality. Musculoskeletal: No chest wall abnormality.  Bilateral gynecomastia. No suspicious lytic or blastic osseous lesions. No acute displaced fracture. Review of the MIP images confirms the above findings. IMPRESSION: 1. No pulmonary embolus. 2. No acute intrapulmonary abnormality. 3. Tiny hiatal hernia. 4. Aortic Atherosclerosis (ICD10-I70.0) including three-vessel coronary artery calcifications. Electronically Signed   By: MIven FinnM.D.   On: 07/01/2021 21:29   CT ABDOMEN PELVIS W CONTRAST  Result Date: 07/12/2021 CLINICAL DATA:  Abdomen pain with vomiting EXAM: CT ABDOMEN AND PELVIS WITH CONTRAST TECHNIQUE: Multidetector CT imaging of the abdomen and pelvis was performed using the standard protocol following bolus administration of intravenous contrast. CONTRAST:  856mOMNIPAQUE IOHEXOL 350 MG/ML SOLN COMPARISON:  CT chest 07/01/2021, CT abdomen pelvis 05/22/2021 FINDINGS: Lower chest: Lung bases  demonstrate no acute consolidation or effusion. Borderline cardiomegaly. Mild coronary vascular calcification. Hepatobiliary: No focal liver abnormality is seen. Status post cholecystectomy. No biliary dilatation. Pancreas: Unremarkable. No pancreatic ductal dilatation or surrounding inflammatory changes. Spleen: Normal in size without focal abnormality. Adrenals/Urinary Tract: Adrenal glands are normal. Kidneys show no hydronephrosis. The bladder is  normal Stomach/Bowel: The stomach is nonenlarged. No dilated proximal and mid small bowel. Dilated terminal ileum measuring up to 3.5 cm with fecalized appearance, series 2, image 48. Large amount of stool throughout the colon and rectum. No acute bowel wall thickening. Negative appendix Vascular/Lymphatic: Mild aortic atherosclerosis. No aneurysm. No suspicious nodes. Vascular stent in the left iliac vein. Reproductive: Status post hysterectomy. No adnexal masses. Other: Negative for free air or free fluid Musculoskeletal: No acute or significant osseous findings. IMPRESSION: 1. Dilated segment of terminal ileum with fecalized appearance of the terminal ileum to the level of ileocecal valve, with large amount of stool throughout the colon and rectum, findings could be secondary to slow transit, though partial obstruction at the level of ileocecal valve is also possible. 2. Otherwise no CT evidence for acute intra-abdominal or pelvic abnormality Electronically Signed   By: Donavan Foil M.D.   On: 07/12/2021 20:10   CT CORONARY MORPH W/CTA COR W/SCORE W/CA W/CM &/OR WO/CM  Addendum Date: 07/02/2021   ADDENDUM REPORT: 07/02/2021 18:10 CLINICAL DATA:  Chest pain EXAM: Cardiac/Coronary CTA TECHNIQUE: A non-contrast, gated CT scan was obtained with axial slices of 3 mm through the heart for calcium scoring. Calcium scoring was performed using the Agatston method. A 120 kV prospective, gated, contrast cardiac scan was obtained. Gantry rotation speed was 250 msecs and  collimation was 0.6 mm. Two sublingual nitroglycerin tablets (0.8 mg) were given. The 3D data set was reconstructed in 5% intervals of the 35-75% of the R-R cycle. Diastolic phases were analyzed on a dedicated workstation using MPR, MIP, and VRT modes. The patient received 95 cc of contrast. FINDINGS: Image quality: Excellent. Noise artifact is: Limited. Coronary Arteries:  Normal coronary origin.  Right dominance. Left main: The left main is a large caliber vessel with a normal take off from the left coronary cusp that bifurcates to form a left anterior descending artery and a left circumflex artery. There is no plaque or stenosis. Left anterior descending artery: The proximal LAD contains minimal calcified plaque (<25%). The mid and distal segments are patent. The LAD gives off 2 patent diagonal branches. Left circumflex artery: The LCX is non-dominant with minimal non-calcified plaque (<25%). The LCX gives off 3 patent obtuse marginal branches. Right coronary artery: The RCA is dominant with normal take off from the right coronary cusp. The RCA contains minimal calcified plaque (<25%). The RCA terminates as a PDA and right posterolateral branch without evidence of plaque or stenosis. Right Atrium: Right atrial size is within normal limits. Right Ventricle: The right ventricular cavity is within normal limits. Left Atrium: Left atrial size is normal in size with no left atrial appendage filling defect. Left Ventricle: The ventricular cavity size is within normal limits. There are no stigmata of prior infarction. There is no abnormal filling defect. Pulmonary arteries: Normal in size without proximal filling defect. Pulmonary veins: Normal pulmonary venous drainage. Pericardium: Normal thickness with no significant effusion or calcium present. Cardiac valves: The aortic valve is trileaflet without significant calcification. The mitral valve is normal structure with mild annular calcium. Aorta: Normal caliber with  no significant disease. Extra-cardiac findings: See attached radiology report for non-cardiac structures. IMPRESSION: 1. Coronary calcium score of 303. This was 85th percentile for age-, sex, and race-matched controls. 2. Normal coronary origin with right dominance. 3. Non-obstructive CAD (<25%) in the LAD/LCX/RCA. RECOMMENDATIONS: 1. Minimal non-obstructive CAD (0-24%). Consider non-atherosclerotic causes of chest pain. Consider preventive therapy and risk factor modification. Eleonore Chiquito, MD Electronically Signed  By: Eleonore Chiquito M.D.   On: 07/02/2021 18:10   Result Date: 07/02/2021 EXAM: OVER-READ INTERPRETATION  CT CHEST The following report is an over-read performed by radiologist Dr. Zetta Bills of [facility], [place] on 07/02/2021. This over-read does not include interpretation of cardiac or coronary anatomy or pathology. The coronary calcium score/coronary CTA interpretation by the cardiologist is attached. COMPARISON:  CT angiography of the chest dated 08/31 of 2022. FINDINGS: Vascular: See dedicated report for cardiovascular details and coronary artery calcium scoring. Calcified aortic atherosclerosis. Mediastinum/Nodes: Chest is imaged only through the mid chest to cover cardiac structures. No adenopathy in the visualized chest. Lungs/Pleura: Airways are patent, visualized portions of the airways. Minimal basilar atelectasis. No effusion. No gross consolidative process. Upper Abdomen: Mild circumferential thickening of small hiatal hernia and distal esophagus. Otherwise unremarkable appearance of upper abdominal contents. Musculoskeletal: No acute bone finding or destructive bone process. IMPRESSION: Mild circumferential thickening of small hiatal hernia and distal esophagus. Correlate with any clinical evidence of esophagitis with dedicated esophageal evaluation as warranted. Otherwise no acute or significant extracardiac findings. Aortic Atherosclerosis (ICD10-I70.0). Electronically Signed:  By: Zetta Bills M.D. On: 07/02/2021 17:17   DG Chest Port 1 View  Result Date: 07/12/2021 CLINICAL DATA:  Vomiting EXAM: PORTABLE CHEST 1 VIEW COMPARISON:  07/01/2021 FINDINGS: Right shoulder replacement. No focal opacity or pleural effusion. Borderline cardiomegaly. Aortic atherosclerosis. No pneumothorax IMPRESSION: No active disease.  Borderline cardiomegaly Electronically Signed   By: Donavan Foil M.D.   On: 07/12/2021 19:36   ECHOCARDIOGRAM COMPLETE  Result Date: 07/02/2021    ECHOCARDIOGRAM REPORT   Patient Name:   Gayna KASUMI FOULKES Date of Exam: 07/02/2021 Medical Rec #:  [id number]      Height:       68.0 in Accession #:    [id number]     Weight:       205.2 lb Date of Birth:  May 24, 1946      BSA:          2.066 m Patient Age:    2 years       BP:           144/74 mmHg Patient Gender: F              HR:           69 bpm. Exam Location:  Inpatient Procedure: 2D Echo, Cardiac Doppler and Color Doppler Indications:    Dyspnea R06.00  History:        Patient has no prior history of Echocardiogram examinations.  Sonographer:    Merrie Roof RDCS Referring Phys: [id number] [place]  1. Left ventricular ejection fraction, by estimation, is 60 to 65%. The left ventricle has normal function. The left ventricle has no regional wall motion abnormalities. There is mild left ventricular hypertrophy. Left ventricular diastolic parameters are consistent with Grade I diastolic dysfunction (impaired relaxation).  2. Right ventricular systolic function is normal. The right ventricular size is normal. There is normal pulmonary artery systolic pressure. The estimated right ventricular systolic pressure is [id number] mmHg.  3. Left atrial size was moderately dilated.  4. The mitral valve is normal in structure. Mild mitral valve regurgitation. No evidence of mitral stenosis.  5. The aortic valve is tricuspid. Aortic valve regurgitation is not visualized. Mild aortic valve sclerosis is present, with no  evidence of aortic valve stenosis.  6. The inferior vena cava is normal in size with greater than 50% respiratory variability, suggesting right atrial pressure  of 3 mmHg. FINDINGS  Left Ventricle: Left ventricular ejection fraction, by estimation, is 60 to 65%. The left ventricle has normal function. The left ventricle has no regional wall motion abnormalities. The left ventricular internal cavity size was normal in size. There is  mild left ventricular hypertrophy. Left ventricular diastolic parameters are consistent with Grade I diastolic dysfunction (impaired relaxation). Right Ventricle: The right ventricular size is normal. No increase in right ventricular wall thickness. Right ventricular systolic function is normal. There is normal pulmonary artery systolic pressure. The tricuspid regurgitant velocity is 2.38 m/s, and  with an assumed right atrial pressure of 3 mmHg, the estimated right ventricular systolic pressure is [id number] mmHg. Left Atrium: Left atrial size was moderately dilated. Right Atrium: Right atrial size was normal in size. Pericardium: There is no evidence of pericardial effusion. Mitral Valve: The mitral valve is normal in structure. Mild mitral annular calcification. Mild mitral valve regurgitation. No evidence of mitral valve stenosis. Tricuspid Valve: The tricuspid valve is normal in structure. Tricuspid valve regurgitation is trivial. No evidence of tricuspid stenosis. Aortic Valve: The aortic valve is tricuspid. Aortic valve regurgitation is not visualized. Mild aortic valve sclerosis is present, with no evidence of aortic valve stenosis. Aortic valve mean gradient measures 7.0 mmHg. Aortic valve peak gradient measures 12.4 mmHg. Aortic valve area, by VTI measures 1.21 cm. Pulmonic Valve: The pulmonic valve was normal in structure. Pulmonic valve regurgitation is not visualized. No evidence of pulmonic stenosis. Aorta: The aortic root is normal in size and structure. Venous: The inferior  vena cava is normal in size with greater than 50% respiratory variability, suggesting right atrial pressure of 3 mmHg. IAS/Shunts: No atrial level shunt detected by color flow Doppler.  LEFT VENTRICLE PLAX 2D LVIDd:         4.80 cm  Diastology LVIDs:         3.30 cm  LV e' medial:    7.94 cm/s LV PW:         1.20 cm  LV E/e' medial:  11.4 LV IVS:        1.20 cm  LV e' lateral:   7.51 cm/s LVOT diam:     1.80 cm  LV E/e' lateral: 12.1 LV SV:         42 LV SV Index:   20 LVOT Area:     2.54 cm  LEFT ATRIUM             Index       RIGHT ATRIUM           Index LA diam:        3.80 cm 1.84 cm/m  RA Area:     12.90 cm LA Vol (A2C):   52.4 ml 25.36 ml/m RA Volume:   29.10 ml  14.08 ml/m LA Vol (A4C):   87.5 ml 42.35 ml/m LA Biplane Vol: 68.4 ml 33.10 ml/m  AORTIC VALVE AV Area (Vmax):    1.14 cm AV Area (Vmean):   1.05 cm AV Area (VTI):     1.21 cm AV Vmax:           176.00 cm/s AV Vmean:          122.000 cm/s AV VTI:            0.350 m AV Peak Grad:      12.4 mmHg AV Mean Grad:      7.0 mmHg LVOT Vmax:         79.00 cm/s  LVOT Vmean:        50.400 cm/s LVOT VTI:          0.166 m LVOT/AV VTI ratio: 0.47  AORTA Ao Root diam: 3.10 cm MITRAL VALVE               TRICUSPID VALVE MV Area (PHT): 3.99 cm    TR Peak grad:   22.7 mmHg MV Decel Time: 190 msec    TR Vmax:        238.00 cm/s MV E velocity: 90.80 cm/s MV A velocity: 95.10 cm/s  SHUNTS MV E/A ratio:  0.95        Systemic VTI:  0.17 m                            Systemic Diam: 1.80 cm Candee Furbish MD Electronically signed by Candee Furbish MD Signature Date/Time: 07/02/2021/4:05:03 PM    Final    ECHOCARDIOGRAM LIMITED  Result Date: 07/14/2021    ECHOCARDIOGRAM LIMITED REPORT   Patient Name:   Diane [name] Date of Exam: 07/14/2021 Medical Rec #:  [id number]      Height:       68.0 in Accession #:    [id number]     Weight:       201.1 lb Date of Birth:  July 21, 1946      BSA:          2.048 m Patient Age:    75 years       BP:           140/68 mmHg Patient Gender: F               HR:           85 bpm. Exam Location:  Inpatient Procedure: Limited Echo, Limited Color Doppler and Cardiac Doppler Indications:    R07.9* Chest pain, unspecified  History:        Patient has prior history of Echocardiogram examinations, most                 recent 07/02/2021. Risk Factors:Diabetes and Hypertension.  Sonographer:    Bernadene Person RDCS Referring Phys: [id number] [place]  1. Left ventricular ejection fraction, by estimation, is 60 to 65%. The left ventricle has normal function. The left ventricle has no regional wall motion abnormalities. Left ventricular diastolic parameters are consistent with Grade I diastolic dysfunction (impaired relaxation).  2. Right ventricular systolic function is normal. The right ventricular size is normal. There is normal pulmonary artery systolic pressure.  3. The mitral valve is normal in structure. No evidence of mitral valve regurgitation. No evidence of mitral stenosis.  4. The aortic valve is tricuspid. There is mild calcification of the aortic valve. There is mild thickening of the aortic valve. Aortic valve regurgitation is not visualized. Mild aortic valve sclerosis is present, with no evidence of aortic valve stenosis.  5. Aortic dilatation noted. There is borderline dilatation of the ascending aorta, measuring 36 mm.  6. The inferior vena cava is normal in size with greater than 50% respiratory variability, suggesting right atrial pressure of 3 mmHg. FINDINGS  Left Ventricle: Left ventricular ejection fraction, by estimation, is 60 to 65%. The left ventricle has normal function. The left ventricle has no regional wall motion abnormalities. The left ventricular internal cavity size was normal in size. There is  no left ventricular hypertrophy. Left ventricular diastolic parameters are consistent with Grade I diastolic  dysfunction (impaired relaxation). Normal left ventricular filling pressure. Right Ventricle: The right ventricular size  is normal. No increase in right ventricular wall thickness. Right ventricular systolic function is normal. There is normal pulmonary artery systolic pressure. The tricuspid regurgitant velocity is 2.43 m/s, and  with an assumed right atrial pressure of 3 mmHg, the estimated right ventricular systolic pressure is 123XX123 mmHg. Mitral Valve: The mitral valve is normal in structure. Mild mitral annular calcification. No evidence of mitral valve stenosis. Tricuspid Valve: The tricuspid valve is normal in structure. Tricuspid valve regurgitation is trivial. No evidence of tricuspid stenosis. Aortic Valve: The aortic valve is tricuspid. There is mild calcification of the aortic valve. There is mild thickening of the aortic valve. Aortic valve regurgitation is not visualized. Mild aortic valve sclerosis is present, with no evidence of aortic valve stenosis. Pulmonic Valve: The pulmonic valve was normal in structure. Pulmonic valve regurgitation is not visualized. No evidence of pulmonic stenosis. Aorta: Aortic dilatation noted. There is borderline dilatation of the ascending aorta, measuring 36 mm. Venous: The inferior vena cava is normal in size with greater than 50% respiratory variability, suggesting right atrial pressure of 3 mmHg. LEFT VENTRICLE PLAX 2D LVIDd:         4.90 cm  Diastology LVIDs:         3.00 cm  LV e' medial:    8.73 cm/s LV PW:         1.00 cm  LV E/e' medial:  7.2 LV IVS:        0.90 cm  LV e' lateral:   12.70 cm/s LVOT diam:     2.10 cm  LV E/e' lateral: 5.0 LV SV:         67 LV SV Index:   33 LVOT Area:     3.46 cm  RIGHT VENTRICLE RV S prime:     13.60 cm/s TAPSE (M-mode): 2.2 cm LEFT ATRIUM         Index LA diam:    2.90 cm 1.42 cm/m  AORTIC VALVE LVOT Vmax:   133.00 cm/s LVOT Vmean:  84.500 cm/s LVOT VTI:    0.194 m  AORTA Ao Root diam: 3.10 cm Ao Asc diam:  3.60 cm MITRAL VALVE                TRICUSPID VALVE MV Area (PHT): 3.50 cm     TR Peak grad:   23.6 mmHg MV Decel Time: 217 msec     TR  Vmax:        243.00 cm/s MV E velocity: 63.10 cm/s MV A velocity: 113.00 cm/s  SHUNTS MV E/A ratio:  0.56         Systemic VTI:  0.19 m                             Systemic Diam: 2.10 cm Skeet Latch MD Electronically signed by Skeet Latch MD Signature Date/Time: 07/14/2021/12:26:53 PM    Final      Discharge Exam: Vitals:   07/14/21 2243 07/15/21 0423  BP:  (!) 144/68  Pulse:  82  Resp:  18  Temp: 98.2 F (36.8 C) 98.5 F (36.9 C)  SpO2:  100%   Vitals:   07/14/21 1957 07/14/21 2243 07/15/21 0423 07/15/21 0533  BP: 129/67  (!) 144/68   Pulse: 91  82   Resp: 16  18   Temp: (!) 100.4 F (38 C) 98.2 F (  36.8 C) 98.5 F (36.9 C)   TempSrc: Oral Oral Oral   SpO2: 99%  100%   Weight:    92.5 kg  Height:        General: Pt is alert, awake, not in acute distress Cardiovascular: RRR, S1/S2 +, no rubs, no gallops Respiratory: CTA bilaterally, no wheezing, no rhonchi Abdominal: Soft, NT, ND, bowel sounds + Extremities: no edema, no cyanosis    The results of significant diagnostics from this hospitalization (including imaging, microbiology, ancillary and laboratory) are listed below for reference.     Microbiology: Recent Results (from the past 240 hour(s))  Urine Culture     Status: Abnormal   Collection Time: 07/12/21  6:10 PM   Specimen: Urine, Clean Catch  Result Value Ref Range Status   Specimen Description   Final    IN/OUT CATH URINE Performed at [facility], [address] [place],  [address]    Special Requests   Final    NONE Performed at [facility], [address] [place], [place] [address]    Culture MULTIPLE SPECIES PRESENT, SUGGEST RECOLLECTION (A)  Final   Report Status 07/14/2021 FINAL  Final  Resp Panel by RT-PCR (Flu A&B, Covid) Nasopharyngeal Swab     Status: Abnormal   Collection Time: 07/12/21  6:18 PM   Specimen: Nasopharyngeal Swab; Nasopharyngeal(NP) swabs in vial transport medium  Result Value Ref  Range Status   SARS Coronavirus 2 by RT PCR POSITIVE (A) NEGATIVE Final    Comment: RESULT CALLED TO, READ BACK BY AND VERIFIED WITH:  POWELL,V RN '@1950'$  07/12/21 EDENSCA (NOTE) SARS-CoV-2 target nucleic acids are DETECTED.  The SARS-CoV-2 RNA is generally detectable in upper respiratory specimens during the acute phase of infection. Positive results are indicative of the presence of the identified virus, but do not rule out bacterial infection or co-infection with other pathogens not detected by the test. Clinical correlation with patient history and other diagnostic information is necessary to determine patient infection status. The expected result is Negative.  Fact Sheet for Patients: EntrepreneurPulse.com.au  Fact Sheet for Healthcare Providers: IncredibleEmployment.be  This test is not yet approved or cleared by the [place] FDA and  has been authorized for detection and/or diagnosis of SARS-CoV-2 by FDA under an Emergency Use Authorization (EUA).  This EUA will remain in effect (meaning this test can b e used) for the duration of  the COVID-19 declaration under Section 564(b)(1) of the Act, 21 U.S.C. section 360bbb-3(b)(1), unless the authorization is terminated or revoked sooner.     Influenza A by PCR NEGATIVE NEGATIVE Final   Influenza B by PCR NEGATIVE NEGATIVE Final    Comment: (NOTE) The Xpert Xpress SARS-CoV-2/FLU/RSV plus assay is intended as an aid in the diagnosis of influenza from Nasopharyngeal swab specimens and should not be used as a sole basis for treatment. Nasal washings and aspirates are unacceptable for Xpert Xpress SARS-CoV-2/FLU/RSV testing.  Fact Sheet for Patients: EntrepreneurPulse.com.au  Fact Sheet for Healthcare Providers: IncredibleEmployment.be  This test is not yet approved or cleared by the [place] FDA and has been authorized for detection and/or  diagnosis of SARS-CoV-2 by FDA under an Emergency Use Authorization (EUA). This EUA will remain in effect (meaning this test can be used) for the duration of the COVID-19 declaration under Section 564(b)(1) of the Act, 21 U.S.C. section 360bbb-3(b)(1), unless the authorization is terminated or revoked.  Performed at [facility], [address] [place], [place] [address]   Blood Culture (routine x 2)     Status: None (Preliminary result)   Collection Time: 07/12/21  6:36 PM   Specimen: BLOOD LEFT ARM  Result Value Ref Range Status   Specimen Description   Final    BLOOD LEFT ARM BLOOD Performed at [facility], [address] [place], [place] [address]    Special Requests   Final    Blood Culture adequate volume BOTTLES DRAWN AEROBIC AND ANAEROBIC Performed at [facility], [address] [place], [place] [address]    Culture   Final    NO GROWTH 3 DAYS Performed at [facility], [address], [place], [place] [address]    Report Status PENDING  Incomplete  Blood Culture (routine x 2)     Status: None (Preliminary result)   Collection Time: 07/12/21  6:37 PM   Specimen: BLOOD LEFT ARM  Result Value Ref Range Status   Specimen Description   Final    BLOOD LEFT ARM BLOOD Performed at [facility], [address] [place], [place] [address]    Special Requests   Final    Blood Culture adequate volume BOTTLES DRAWN AEROBIC AND ANAEROBIC Performed at [facility], [address], [place], [place] [address]    Culture   Final    NO GROWTH 3 DAYS Performed at [facility], [place] [address], [place], [place] [address]    Report Status PENDING  Incomplete     Labs: BNP (last 3 results) Recent Labs    07/02/21 1255  BNP [id number]   Basic Metabolic Panel: Recent Labs  Lab 07/12/21 1818 07/15/21 0430  NA 136 139  K 3.8 3.2*  CL 104 101  CO2 26 23  GLUCOSE 137* 95  BUN 9 8  CREATININE 0.63 0.51   CALCIUM 8.7* 8.6*   Liver Function Tests: Recent Labs  Lab 07/12/21 1818 07/15/21 0430  AST 19 18  ALT 14 12  ALKPHOS 53 47  BILITOT 0.3 0.7  PROT 7.1 5.9*  ALBUMIN 3.8 2.9*   Recent Labs  Lab 07/12/21 1818  LIPASE 36   No results for input(s): AMMONIA in the last 168 hours. CBC: Recent Labs  Lab 07/12/21 1818 07/15/21 0430  WBC 8.2 5.1  NEUTROABS 6.5  --   HGB 9.5* 9.7*  HCT 30.7* 30.4*  MCV 79.7* 78.8*  PLT 255 223   Cardiac Enzymes: No results for input(s): CKTOTAL, CKMB, CKMBINDEX, TROPONINI in the last 168 hours. BNP: Invalid input(s): POCBNP CBG: Recent Labs  Lab 07/14/21 0245 07/14/21 0634 07/14/21 1122 07/14/21 1503 07/15/21 0622  GLUCAP 120* 137* 124* 118* 91   D-Dimer No results for input(s): DDIMER in the last 72 hours. Hgb A1c No results for input(s): HGBA1C in the last 72 hours. Lipid Profile No results for input(s): CHOL, HDL, LDLCALC, TRIG, CHOLHDL, LDLDIRECT in the last 72 hours. Thyroid function studies No results for input(s): TSH, T4TOTAL, T3FREE, THYROIDAB in the last 72 hours.  Invalid input(s): FREET3 Anemia work up No results for input(s): VITAMINB12, FOLATE, FERRITIN, TIBC, IRON, RETICCTPCT in the last 72 hours. Urinalysis    Component Value Date/Time   COLORURINE YELLOW 07/12/2021 1810   APPEARANCEUR CLEAR 07/12/2021 1810   LABSPEC 1.015 07/12/2021 1810   PHURINE 6.0 07/12/2021 1810   GLUCOSEU NEGATIVE 07/12/2021 1810   HGBUR NEGATIVE 07/12/2021 1810   [place] 07/12/2021 1810  KETONESUR NEGATIVE 07/12/2021 1810   PROTEINUR NEGATIVE 07/12/2021 1810   UROBILINOGEN 0.2 06/15/2014 0524   NITRITE NEGATIVE 07/12/2021 1810   LEUKOCYTESUR NEGATIVE 07/12/2021 1810   Sepsis Labs Invalid input(s): PROCALCITONIN,  WBC,  LACTICIDVEN Microbiology Recent Results (from the past 240 hour(s))  Urine Culture     Status: Abnormal   Collection Time: 07/12/21  6:10 PM   Specimen: Urine, Clean Catch  Result Value Ref  Range Status   Specimen Description   Final    IN/OUT CATH URINE Performed at [facility], [address] [place], [place] [address]    Special Requests   Final    NONE Performed at [facility], [address] [place], [place] [address]    Culture MULTIPLE SPECIES PRESENT, SUGGEST RECOLLECTION (A)  Final   Report Status 07/14/2021 FINAL  Final  Resp Panel by RT-PCR (Flu A&B, Covid) Nasopharyngeal Swab     Status: Abnormal   Collection Time: 07/12/21  6:18 PM   Specimen: Nasopharyngeal Swab; Nasopharyngeal(NP) swabs in vial transport medium  Result Value Ref Range Status   SARS Coronavirus 2 by RT PCR POSITIVE (A) NEGATIVE Final    Comment: RESULT CALLED TO, READ BACK BY AND VERIFIED WITH:  POWELL,V RN '@1950'$  07/12/21 EDENSCA (NOTE) SARS-CoV-2 target nucleic acids are DETECTED.  The SARS-CoV-2 RNA is generally detectable in upper respiratory specimens during the acute phase of infection. Positive results are indicative of the presence of the identified virus, but do not rule out bacterial infection or co-infection with other pathogens not detected by the test. Clinical correlation with patient history and other diagnostic information is necessary to determine patient infection status. The expected result is Negative.  Fact Sheet for Patients: EntrepreneurPulse.com.au  Fact Sheet for Healthcare Providers: IncredibleEmployment.be  This test is not yet approved or cleared by the [place] FDA and  has been authorized for detection and/or diagnosis of SARS-CoV-2 by FDA under an Emergency Use Authorization (EUA).  This EUA will remain in effect (meaning this test can b e used) for the duration of  the COVID-19 declaration under Section 564(b)(1) of the Act, 21 U.S.C. section 360bbb-3(b)(1), unless the authorization is terminated or revoked sooner.     Influenza A by PCR NEGATIVE NEGATIVE Final   Influenza B by PCR  NEGATIVE NEGATIVE Final    Comment: (NOTE) The Xpert Xpress SARS-CoV-2/FLU/RSV plus assay is intended as an aid in the diagnosis of influenza from Nasopharyngeal swab specimens and should not be used as a sole basis for treatment. Nasal washings and aspirates are unacceptable for Xpert Xpress SARS-CoV-2/FLU/RSV testing.  Fact Sheet for Patients: EntrepreneurPulse.com.au  Fact Sheet for Healthcare Providers: IncredibleEmployment.be  This test is not yet approved or cleared by the [place] FDA and has been authorized for detection and/or diagnosis of SARS-CoV-2 by FDA under an Emergency Use Authorization (EUA). This EUA will remain in effect (meaning this test can be used) for the duration of the COVID-19 declaration under Section 564(b)(1) of the Act, 21 U.S.C. section 360bbb-3(b)(1), unless the authorization is terminated or revoked.  Performed at [facility], [address] [place], [place] [address]   Blood Culture (routine x 2)     Status: None (Preliminary result)   Collection Time: 07/12/21  6:36 PM   Specimen: BLOOD LEFT ARM  Result Value Ref Range Status   Specimen Description   Final    BLOOD LEFT ARM BLOOD Performed at [facility],  [place], [place] [address]    Special Requests   Final    Blood Culture adequate volume BOTTLES DRAWN AEROBIC AND ANAEROBIC Performed at [facility], [address] [place], [place] [address]    Culture   Final    NO GROWTH 3 DAYS Performed at [facility], [place] [address] [place], [place] [address]    Report Status PENDING  Incomplete  Blood Culture (routine x 2)     Status: None (Preliminary result)   Collection Time: 07/12/21  6:37 PM   Specimen: BLOOD LEFT ARM  Result Value Ref Range Status   Specimen Description   Final    BLOOD LEFT ARM BLOOD Performed at [facility], [address] [place], [place] [address]     Special Requests   Final    Blood Culture adequate volume BOTTLES DRAWN AEROBIC AND ANAEROBIC Performed at [facility], [address], [place], [place] [address]    Culture   Final    NO GROWTH 3 DAYS Performed at [facility], [place] [address] [place], [place] [address]    Report Status PENDING  Incomplete     Time coordinating discharge: Over 30 minutes  SIGNED:   Darliss Cheney, MD  [facility] 07/15/2021, 10:39 AM  If 7PM-7AM, please contact night-coverage [email or web address]

## 2021-07-15 NOTE — Progress Notes (Signed)
Subjective: CC: Patient reports she had 3 formed bm's yesterday and 1 large formed bm this am. Continues to pass flatus. No abdominal pain currently. Tolerating liquids w/ miralax mixed in w/o n/v.   Objective: Vital signs in last 24 hours: Temp:  [98.2 F (36.8 C)-100.4 F (38 C)] 98.5 F (36.9 C) (09/14 0423) Pulse Rate:  [80-91] 82 (09/14 0423) Resp:  [15-19] 18 (09/14 0423) BP: (126-144)/(62-73) 144/68 (09/14 0423) SpO2:  [95 %-100 %] 100 % (09/14 0423) Weight:  [92.5 kg] 92.5 kg (09/14 0533) Last BM Date: 07/14/21  Intake/Output from previous day: 09/13 0701 - 09/14 0700 In: 2006.2 [P.O.:420; I.V.:1586.2] Out: 200 [Urine:200] Intake/Output this shift: No intake/output data recorded.  PE: Gen:  Alert, NAD, pleasant Abd: Very soft and ND with normoactive bowel sounds. NT Psych: A&Ox3  Skin: no rashes noted, warm and dry  Lab Results:  Recent Labs    07/12/21 1818 07/15/21 0430  WBC 8.2 5.1  HGB 9.5* 9.7*  HCT 30.7* 30.4*  PLT 255 223   BMET Recent Labs    07/12/21 1818 07/15/21 0430  NA 136 139  K 3.8 3.2*  CL 104 101  CO2 26 23  GLUCOSE 137* 95  BUN 9 8  CREATININE 0.63 0.51  CALCIUM 8.7* 8.6*   PT/INR Recent Labs    07/12/21 1818  LABPROT 18.2*  INR 1.5*   CMP     Component Value Date/Time   NA 139 07/15/2021 0430   NA 145 06/19/2018 0000   K 3.2 (L) 07/15/2021 0430   CL 101 07/15/2021 0430   CO2 23 07/15/2021 0430   GLUCOSE 95 07/15/2021 0430   BUN 8 07/15/2021 0430   BUN 7 06/19/2018 0000   CREATININE 0.51 07/15/2021 0430   CALCIUM 8.6 (L) 07/15/2021 0430   PROT 5.9 (L) 07/15/2021 0430   ALBUMIN 2.9 (L) 07/15/2021 0430   AST 18 07/15/2021 0430   ALT 12 07/15/2021 0430   ALKPHOS 47 07/15/2021 0430   BILITOT 0.7 07/15/2021 0430   GFRNONAA >60 07/15/2021 0430   GFRAA >60 12/09/2019 2243   Lipase     Component Value Date/Time   LIPASE 36 07/12/2021 1818    Studies/Results: ECHOCARDIOGRAM LIMITED  Result Date:  07/14/2021    ECHOCARDIOGRAM LIMITED REPORT   Patient Name:   Diane [name] Date of Exam: 07/14/2021 Medical Rec #:  [id number]      Height:       68.0 in Accession #:    [id number]     Weight:       201.1 lb Date of Birth:  [birth year]      BSA:          2.048 m Patient Age:    75 years       BP:           140/68 mmHg Patient Gender: F              HR:           85 bpm. Exam Location:  Inpatient Procedure: Limited Echo, Limited Color Doppler and Cardiac Doppler Indications:    R07.9* Chest pain, unspecified  History:        Patient has prior history of Echocardiogram examinations, most                 recent 07/02/2021. Risk Factors:Diabetes and Hypertension.  Sonographer:    Bernadene Person RDCS Referring Phys: [id number] [place]  1.  Left ventricular ejection fraction, by estimation, is 60 to 65%. The left ventricle has normal function. The left ventricle has no regional wall motion abnormalities. Left ventricular diastolic parameters are consistent with Grade I diastolic dysfunction (impaired relaxation).  2. Right ventricular systolic function is normal. The right ventricular size is normal. There is normal pulmonary artery systolic pressure.  3. The mitral valve is normal in structure. No evidence of mitral valve regurgitation. No evidence of mitral stenosis.  4. The aortic valve is tricuspid. There is mild calcification of the aortic valve. There is mild thickening of the aortic valve. Aortic valve regurgitation is not visualized. Mild aortic valve sclerosis is present, with no evidence of aortic valve stenosis.  5. Aortic dilatation noted. There is borderline dilatation of the ascending aorta, measuring 36 mm.  6. The inferior vena cava is normal in size with greater than 50% respiratory variability, suggesting right atrial pressure of 3 mmHg. FINDINGS  Left Ventricle: Left ventricular ejection fraction, by estimation, is 60 to 65%. The left ventricle has normal function. The left ventricle has no  regional wall motion abnormalities. The left ventricular internal cavity size was normal in size. There is  no left ventricular hypertrophy. Left ventricular diastolic parameters are consistent with Grade I diastolic dysfunction (impaired relaxation). Normal left ventricular filling pressure. Right Ventricle: The right ventricular size is normal. No increase in right ventricular wall thickness. Right ventricular systolic function is normal. There is normal pulmonary artery systolic pressure. The tricuspid regurgitant velocity is 2.43 m/s, and  with an assumed right atrial pressure of 3 mmHg, the estimated right ventricular systolic pressure is 123XX123 mmHg. Mitral Valve: The mitral valve is normal in structure. Mild mitral annular calcification. No evidence of mitral valve stenosis. Tricuspid Valve: The tricuspid valve is normal in structure. Tricuspid valve regurgitation is trivial. No evidence of tricuspid stenosis. Aortic Valve: The aortic valve is tricuspid. There is mild calcification of the aortic valve. There is mild thickening of the aortic valve. Aortic valve regurgitation is not visualized. Mild aortic valve sclerosis is present, with no evidence of aortic valve stenosis. Pulmonic Valve: The pulmonic valve was normal in structure. Pulmonic valve regurgitation is not visualized. No evidence of pulmonic stenosis. Aorta: Aortic dilatation noted. There is borderline dilatation of the ascending aorta, measuring 36 mm. Venous: The inferior vena cava is normal in size with greater than 50% respiratory variability, suggesting right atrial pressure of 3 mmHg. LEFT VENTRICLE PLAX 2D LVIDd:         4.90 cm  Diastology LVIDs:         3.00 cm  LV e' medial:    8.73 cm/s LV PW:         1.00 cm  LV E/e' medial:  7.2 LV IVS:        0.90 cm  LV e' lateral:   12.70 cm/s LVOT diam:     2.10 cm  LV E/e' lateral: 5.0 LV SV:         67 LV SV Index:   33 LVOT Area:     3.46 cm  RIGHT VENTRICLE RV S prime:     13.60 cm/s TAPSE  (M-mode): 2.2 cm LEFT ATRIUM         Index LA diam:    2.90 cm 1.42 cm/m  AORTIC VALVE LVOT Vmax:   133.00 cm/s LVOT Vmean:  84.500 cm/s LVOT VTI:    0.194 m  AORTA Ao Root diam: 3.10 cm Ao Asc diam:  3.60 cm  MITRAL VALVE                TRICUSPID VALVE MV Area (PHT): 3.50 cm     TR Peak grad:   23.6 mmHg MV Decel Time: 217 msec     TR Vmax:        243.00 cm/s MV E velocity: 63.10 cm/s MV A velocity: 113.00 cm/s  SHUNTS MV E/A ratio:  0.56         Systemic VTI:  0.19 m                             Systemic Diam: 2.10 cm Skeet Latch MD Electronically signed by Skeet Latch MD Signature Date/Time: 07/14/2021/12:26:53 PM    Final     Anti-infectives: Anti-infectives (From admission, onward)    Start     Dose/Rate Route Frequency Ordered Stop   07/15/21 1000  nirmatrelvir/ritonavir EUA (PAXLOVID) 3 tablet  Status:  Discontinued        3 tablet Oral 2 times daily 07/15/21 0835 07/15/21 0902   07/15/21 1000  molnupiravir EUA (LAGEVRIO) capsule 800 mg        4 capsule Oral 2 times daily 07/15/21 0902 07/20/21 0959   07/15/21 0000  nirmatrelvir & ritonavir (PAXLOVID) 20 x 150 MG & 10 x '100MG'$  TBPK  Status:  Discontinued        3 tablet Oral 2 times daily 07/15/21 0838 07/15/21    07/15/21 0000  molnupiravir EUA (LAGEVRIO) 200 mg CAPS capsule        4 capsule Oral 2 times daily 07/15/21 [id number] 07/20/21 2359   07/12/21 1815  cefTRIAXone (ROCEPHIN) 2 g in sodium chloride 0.9 % 100 mL IVPB        2 g 200 mL/hr over 30 Minutes Intravenous  Once 07/12/21 1814 07/12/21 1915   07/12/21 1815  metroNIDAZOLE (FLAGYL) IVPB 500 mg        500 mg 100 mL/hr over 60 Minutes Intravenous  Once 07/12/21 1814 07/12/21 2144        Assessment/Plan Abdominal pain, n/v - Patient CT w/ Dilated segment of terminal ileum with fecalized appearance of the terminal ileum to the level of ileocecal valve, with large amount of stool throughout the colon and rectum.  - After being placed on a bowel regimen patient has had  several bm's with resolution of her symptoms. She denies any abdominal pain, n/v. Her abdomen is very soft. No indication for any surgical procedures. Diet can be advanced as tolerated from our standpoint. We will sign off. It appears primary has already placed a discharge order this morning. Recommend establishing GI follow up and continuing bowel regimen at discharge. Discussed with TRH.   LOS: 2 days    Jillyn Ledger , [facility] 07/15/2021, 9:03 AM Please see Amion for pager number during day hours 7:00am-4:30pm

## 2021-07-15 NOTE — TOC Initial Note (Signed)
Transition of Care [facility]) - Initial/Assessment Note    Patient Details  Name: Diane [name] MRN: [id number] Date of Birth: 1946/10/24  Transition of Care [facility]) CM/SW Contact:    Zenon Mayo, RN Phone Number: 07/15/2021, 10:37 AM  Clinical Narrative:                 Patient is for dc today, NCM offered choice, she chose [place], NCM made referral to [facility] ([place]) with [facility] for [place], awaiting to hear back.    Expected Discharge Plan: [place] Barriers to Discharge: No Barriers Identified   Patient Goals and CMS Choice Patient states their goals for this hospitalization and ongoing recovery are:: return home with [facility] CMS Medicare.gov Compare Post Acute Care list provided to:: Patient Choice offered to / list presented to : Patient  Expected Discharge Plan and Services Expected Discharge Plan: [place]   Discharge Planning Services: CM Consult Post Acute Care Choice: [place] arrangements for the past 2 months: Single Family Home Expected Discharge Date: 07/15/21                 DME Agency: NA       HH Arranged: PT HH Agency: [place] Date [facility] Agency Contacted: 07/15/21 Time HH Agency Contacted: [phone] Representative spoke with at [place]: Tommi Rumps  Prior Living Arrangements/Services Living arrangements for the past 2 months: [place] Lives with:: Self Patient language and need for interpreter reviewed:: Yes Do you feel safe going back to the place where you live?: Yes      Need for Family Participation in Patient Care: Yes (Comment) Care giver support system in place?: Yes (comment)   Criminal Activity/Legal Involvement Pertinent to Current Situation/Hospitalization: No - Comment as needed  Activities of Daily Living      Permission Sought/Granted                  Emotional Assessment       Orientation: : Oriented to Place, Oriented to Self, Oriented to  Time, Oriented to  Situation Alcohol / Substance Use: Not Applicable Psych Involvement: No (comment)  Admission diagnosis:  Epigastric pain [R10.13] SBO (small bowel obstruction) ([place]) [K56.609] Elevated troponin [R77.8] COVID-19 [U07.1] Patient Active Problem List   Diagnosis Date Noted   Left leg DVT ([place]) 05/30/2021   Normochromic normocytic anemia 02/12/2021   Constipation    Fever    Acute DVT (deep venous thrombosis) ([place]) 02/04/2021   AKI (acute kidney injury) ([place]) 01/23/2021   Rheumatoid arthritis ([place]) 01/23/2021   SBO (small bowel obstruction) ([place]) 01/22/2021   Postoperative anemia due to acute blood loss 06/18/2018   Type 2 diabetes mellitus with diabetic neuropathy, unspecified ([place]) 06/18/2018   Peripheral neuropathy 06/18/2018   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 [place] [address] Phone: [phone] Fax: [phone]     Social Determinants  of Health (SDOH) Interventions    Readmission Risk Interventions Readmission Risk Prevention Plan 07/15/2021  Transportation Screening Complete  Medication Review Press photographer) Complete  PCP or Specialist appointment within 3-5 days of discharge Complete  HRI or [place] Complete  SW Recovery Care/Counseling Consult Complete  [place] Not Applicable  Some recent data might be hidden

## 2021-07-15 NOTE — Plan of Care (Signed)
  Problem: Education: Goal: Knowledge of General Education information will improve Description: Including pain rating scale, medication(s)/side effects and non-pharmacologic comfort measures Outcome: Adequate for Discharge   Problem: Health Behavior/Discharge Planning: Goal: Ability to manage health-related needs will improve Outcome: Adequate for Discharge   Problem: Clinical Measurements: Goal: Ability to maintain clinical measurements within normal limits will improve Outcome: Adequate for Discharge Goal: Will remain free from infection Outcome: Adequate for Discharge Goal: Diagnostic test results will improve Outcome: Adequate for Discharge Goal: Respiratory complications will improve Outcome: Adequate for Discharge Goal: Cardiovascular complication will be avoided Outcome: Adequate for Discharge   Problem: Activity: Goal: Risk for activity intolerance will decrease Outcome: Adequate for Discharge   Problem: Nutrition: Goal: Adequate nutrition will be maintained Outcome: Adequate for Discharge   Problem: Coping: Goal: Level of anxiety will decrease Outcome: Adequate for Discharge   Problem: Elimination: Goal: Will not experience complications related to bowel motility Outcome: Adequate for Discharge Goal: Will not experience complications related to urinary retention Outcome: Adequate for Discharge   Problem: Pain Managment: Goal: General experience of comfort will improve Outcome: Adequate for Discharge   Problem: Safety: Goal: Ability to remain free from injury will improve Outcome: Adequate for Discharge   Problem: Skin Integrity: Goal: Risk for impaired skin integrity will decrease Outcome: Adequate for Discharge   Problem: Increased Nutrient Needs (NI-5.1) Goal: Food and/or nutrient delivery Description: Individualized approach for food/nutrient provision. Outcome: Adequate for Discharge   Problem: Acute Rehab PT Goals(only PT should resolve) Goal:  Pt Will Go Supine/Side To Sit Outcome: Adequate for Discharge Goal: Patient Will Transfer Sit To/From Stand Outcome: Adequate for Discharge Goal: Pt Will Ambulate Outcome: Adequate for Discharge

## 2021-07-15 NOTE — Progress Notes (Signed)
Cardiology Progress Note  Patient ID: Diane [name] MRN: [id number] DOB: 26-Nov-1945 Date of Encounter: 07/15/2021  Primary Cardiologist: Evalina Field, MD  Subjective   Chief Complaint: Abdominal pain  HPI: Bowel movements reported overnight.  Reports abdominal pain.  No chest pain or trouble breathing.  Echocardiogram reassuring.  ROS:  All other ROS reviewed and negative. Pertinent positives noted in the HPI.     Inpatient Medications  Scheduled Meds:  apixaban  5 mg Oral BID   aspirin EC  81 mg Oral Daily   docusate sodium  100 mg Oral BID   DULoxetine  20 mg Oral q morning   folic acid  1 mg Oral Daily   insulin aspart  0-6 Units Subcutaneous TID WC   lactulose  10 g Oral TID   pantoprazole  40 mg Oral BID   polyethylene glycol  17 g Oral BID   rosuvastatin  10 mg Oral Daily   senna  1 tablet Oral BID   Continuous Infusions:  lactated ringers 100 mL/hr at 07/15/21 0419   PRN Meds: acetaminophen **OR** acetaminophen, acetaminophen, bisacodyl, simethicone, sodium chloride   Vital Signs   Vitals:   07/14/21 1957 07/14/21 2243 07/15/21 0423 07/15/21 0533  BP: 129/67  (!) 144/68   Pulse: 91  82   Resp: 16  18   Temp: (!) 100.4 F (38 C) 98.2 F (36.8 C) 98.5 F (36.9 C)   TempSrc: Oral Oral Oral   SpO2: 99%  100%   Weight:    92.5 kg  Height:        Intake/Output Summary (Last 24 hours) at 07/15/2021 0746 Last data filed at 07/15/2021 0538 Gross per 24 hour  Intake 2006.17 ml  Output 200 ml  Net 1806.17 ml   Last 3 Weights 07/15/2021 07/14/2021 07/13/2021  Weight (lbs) 204 lb 201 lb 1 oz 202 lb 9.6 oz  Weight (kg) 92.534 kg 91.2 kg 91.9 kg      Telemetry  Overnight telemetry shows sinus rhythm in the 80s, which I personally reviewed.   ECG  The most recent ECG shows sinus rhythm heart rate 85, no acute ischemic changes or evidence of infarction, which I personally reviewed.   Physical Exam   Vitals:   07/14/21 1957 07/14/21 2243 07/15/21 0423  07/15/21 0533  BP: 129/67  (!) 144/68   Pulse: 91  82   Resp: 16  18   Temp: (!) 100.4 F (38 C) 98.2 F (36.8 C) 98.5 F (36.9 C)   TempSrc: Oral Oral Oral   SpO2: 99%  100%   Weight:    92.5 kg  Height:        Intake/Output Summary (Last 24 hours) at 07/15/2021 0746 Last data filed at 07/15/2021 0538 Gross per 24 hour  Intake 2006.17 ml  Output 200 ml  Net 1806.17 ml    Last 3 Weights 07/15/2021 07/14/2021 07/13/2021  Weight (lbs) 204 lb 201 lb 1 oz 202 lb 9.6 oz  Weight (kg) 92.534 kg 91.2 kg 91.9 kg    Body mass index is 31.02 kg/m.  General: Well nourished, well developed, in no acute distress Head: Atraumatic, normal size  Eyes: PEERLA, EOMI  Neck: Supple, no JVD Endocrine: No thryomegaly Cardiac: Normal S1, S2; RRR; no murmurs, rubs, or gallops Lungs: Clear to auscultation bilaterally, no wheezing, rhonchi or rales  Abd: Distended abdomen Ext: No edema, pulses 2+ Musculoskeletal: No deformities, BUE and BLE strength normal and equal Skin: Warm and dry, no  rashes   Neuro: Alert and oriented to person, place, time, and situation, CNII-XII grossly intact, no focal deficits  Psych: Normal mood and affect   Labs  High Sensitivity Troponin:   Recent Labs  Lab 07/12/21 1818 07/12/21 2205 07/13/21 0006 07/13/21 0207 07/13/21 0430  TROPONINIHS 5 283* 1,553* 1,866* 1,774*     Cardiac EnzymesNo results for input(s): TROPONINI in the last 168 hours. No results for input(s): TROPIPOC in the last 168 hours.  Chemistry Recent Labs  Lab 07/12/21 1818 07/15/21 0430  NA 136 139  K 3.8 3.2*  CL 104 101  CO2 26 23  GLUCOSE 137* 95  BUN 9 8  CREATININE 0.63 0.51  CALCIUM 8.7* 8.6*  PROT 7.1 5.9*  ALBUMIN 3.8 2.9*  AST 19 18  ALT 14 12  ALKPHOS 53 47  BILITOT 0.3 0.7  GFRNONAA >60 >60  ANIONGAP 6 15    Hematology Recent Labs  Lab 07/12/21 1818 07/15/21 0430  WBC 8.2 5.1  RBC 3.85* 3.86*  HGB 9.5* 9.7*  HCT 30.7* 30.4*  MCV 79.7* 78.8*  MCH 24.7* 25.1*   MCHC 30.9 31.9  RDW 17.4* 17.4*  PLT 255 223   BNPNo results for input(s): BNP, PROBNP in the last 168 hours.  DDimer No results for input(s): DDIMER in the last 168 hours.   Radiology  ECHOCARDIOGRAM LIMITED  Result Date: 07/14/2021    ECHOCARDIOGRAM LIMITED REPORT   Patient Name:   Diane [name] Date of Exam: 07/14/2021 Medical Rec #:  [id number]      Height:       68.0 in Accession #:    [id number]     Weight:       201.1 lb Date of Birth:  03-15-1946      BSA:          2.048 m Patient Age:    75 years       BP:           140/68 mmHg Patient Gender: F              HR:           85 bpm. Exam Location:  Inpatient Procedure: Limited Echo, Limited Color Doppler and Cardiac Doppler Indications:    R07.9* Chest pain, unspecified  History:        Patient has prior history of Echocardiogram examinations, most                 recent 07/02/2021. Risk Factors:Diabetes and Hypertension.  Sonographer:    Bernadene Person RDCS Referring Phys: [id number] [place]  1. Left ventricular ejection fraction, by estimation, is 60 to 65%. The left ventricle has normal function. The left ventricle has no regional wall motion abnormalities. Left ventricular diastolic parameters are consistent with Grade I diastolic dysfunction (impaired relaxation).  2. Right ventricular systolic function is normal. The right ventricular size is normal. There is normal pulmonary artery systolic pressure.  3. The mitral valve is normal in structure. No evidence of mitral valve regurgitation. No evidence of mitral stenosis.  4. The aortic valve is tricuspid. There is mild calcification of the aortic valve. There is mild thickening of the aortic valve. Aortic valve regurgitation is not visualized. Mild aortic valve sclerosis is present, with no evidence of aortic valve stenosis.  5. Aortic dilatation noted. There is borderline dilatation of the ascending aorta, measuring 36 mm.  6. The inferior vena cava is normal in size with greater  than 50%  respiratory variability, suggesting right atrial pressure of 3 mmHg. FINDINGS  Left Ventricle: Left ventricular ejection fraction, by estimation, is 60 to 65%. The left ventricle has normal function. The left ventricle has no regional wall motion abnormalities. The left ventricular internal cavity size was normal in size. There is  no left ventricular hypertrophy. Left ventricular diastolic parameters are consistent with Grade I diastolic dysfunction (impaired relaxation). Normal left ventricular filling pressure. Right Ventricle: The right ventricular size is normal. No increase in right ventricular wall thickness. Right ventricular systolic function is normal. There is normal pulmonary artery systolic pressure. The tricuspid regurgitant velocity is 2.43 m/s, and  with an assumed right atrial pressure of 3 mmHg, the estimated right ventricular systolic pressure is 123XX123 mmHg. Mitral Valve: The mitral valve is normal in structure. Mild mitral annular calcification. No evidence of mitral valve stenosis. Tricuspid Valve: The tricuspid valve is normal in structure. Tricuspid valve regurgitation is trivial. No evidence of tricuspid stenosis. Aortic Valve: The aortic valve is tricuspid. There is mild calcification of the aortic valve. There is mild thickening of the aortic valve. Aortic valve regurgitation is not visualized. Mild aortic valve sclerosis is present, with no evidence of aortic valve stenosis. Pulmonic Valve: The pulmonic valve was normal in structure. Pulmonic valve regurgitation is not visualized. No evidence of pulmonic stenosis. Aorta: Aortic dilatation noted. There is borderline dilatation of the ascending aorta, measuring 36 mm. Venous: The inferior vena cava is normal in size with greater than 50% respiratory variability, suggesting right atrial pressure of 3 mmHg. LEFT VENTRICLE PLAX 2D LVIDd:         4.90 cm  Diastology LVIDs:         3.00 cm  LV e' medial:    8.73 cm/s LV PW:         1.00  cm  LV E/e' medial:  7.2 LV IVS:        0.90 cm  LV e' lateral:   12.70 cm/s LVOT diam:     2.10 cm  LV E/e' lateral: 5.0 LV SV:         67 LV SV Index:   33 LVOT Area:     3.46 cm  RIGHT VENTRICLE RV S prime:     13.60 cm/s TAPSE (M-mode): 2.2 cm LEFT ATRIUM         Index LA diam:    2.90 cm 1.42 cm/m  AORTIC VALVE LVOT Vmax:   133.00 cm/s LVOT Vmean:  84.500 cm/s LVOT VTI:    0.194 m  AORTA Ao Root diam: 3.10 cm Ao Asc diam:  3.60 cm MITRAL VALVE                TRICUSPID VALVE MV Area (PHT): 3.50 cm     TR Peak grad:   23.6 mmHg MV Decel Time: 217 msec     TR Vmax:        243.00 cm/s MV E velocity: 63.10 cm/s MV A velocity: 113.00 cm/s  SHUNTS MV E/A ratio:  0.56         Systemic VTI:  0.19 m                             Systemic Diam: 2.10 cm Skeet Latch MD Electronically signed by Skeet Latch MD Signature Date/Time: 07/14/2021/12:26:53 PM    Final     Cardiac Studies  TTE 07/14/2021  1. Left ventricular ejection fraction, by estimation, is  60 to 65%. The  left ventricle has normal function. The left ventricle has no regional  wall motion abnormalities. Left ventricular diastolic parameters are  consistent with Grade I diastolic  dysfunction (impaired relaxation).   2. Right ventricular systolic function is normal. The right ventricular  size is normal. There is normal pulmonary artery systolic pressure.   3. The mitral valve is normal in structure. No evidence of mitral valve  regurgitation. No evidence of mitral stenosis.   4. The aortic valve is tricuspid. There is mild calcification of the  aortic valve. There is mild thickening of the aortic valve. Aortic valve  regurgitation is not visualized. Mild aortic valve sclerosis is present,  with no evidence of aortic valve  stenosis.   5. Aortic dilatation noted. There is borderline dilatation of the  ascending aorta, measuring 36 mm.   6. The inferior vena cava is normal in size with greater than 50%  respiratory variability,  suggesting right atrial pressure of 3 mmHg.   Patient Profile  Diane [name] is a 75 y.o. female with hypertension, diabetes, obesity, recurrent DVT, recurrent small bowel obstruction, nonobstructive CAD who was admitted on 07/13/2021 with concerns for partial small bowel obstruction and abdominal pain.  She also has COVID-19 infection.  Cardiology was consulted for elevated troponin in the setting of abdominal pain.  Assessment & Plan   #Nonmyocardial infarction troponin elevation versus myocardial infarction with no obstructive coronary disease ([place]) -Recent admission to the hospital for chest pain.  Coronary CTA with minimal CAD and no appreciable epicardial stenoses.  Left heart cath in 2018 was normal.  Echo normal during that admission. -Now admitted with bowel obstruction and COVID-19 infection.  Troponins were minimally elevated and flat.  EKG is nonischemic.  Symptoms are epigastric pain.  Symptoms are slowly improving with bowel movements. -This likely represents demand ischemia in the setting of sepsis versus small vessel disease.  This is not acute coronary syndrome.  Her echo was normal.  EKG is nonischemic. -For now we will continue aspirin and statin.  She can continue Eliquis.  She is on appropriate therapy.  She has follow-up with cardiology  [facility] will sign off.   Medication Recommendations: Continue home medications. Other recommendations (labs, testing, etc): None. Follow up as an outpatient: We will arrange hospital follow-up in 6 to 8 weeks in our office.  For questions or updates, please contact [place] Please consult [email or web address] for contact info under   Time Spent with Patient: I have spent a total of 25 minutes with patient reviewing hospital notes, telemetry, EKGs, labs and examining the patient as well as establishing an assessment and plan that was discussed with the patient.  > 50% of time was spent in direct patient care.    Signed, Addison Naegeli.  Audie Box, MD, [place]  07/15/2021 7:46 AM

## 2021-07-16 ENCOUNTER — Other Ambulatory Visit: Payer: Self-pay | Admitting: *Deleted

## 2021-07-16 NOTE — Patient Outreach (Signed)
[facility]) Care Management  [place]  07/18/2021   Diane [name] 1946/04/06 [id number]   Referral Date: 9/15 Referral Source: Hospital liaison Referral Reason: Post hospital discharge, 6 admissions in the last 6 months Insurance: [place] attempt #1, successful.  Identity verified.  This care manager introduced self and stated purpose of call.  [facility] care management services explained.    Social: Member lives alone but state she has family that is able to help assist with care.  Report lingering cough, denies shortness of breath at this time.  Able to perform ADL's independently.    Conditions: Per chart, has history of HTN, DVT, PE, SBO, DM, and RA.  Medications: Report she is taking as instructed.  Does feel her Eliquis is expensive, has gone up to $65/month, however she is already receiving Extra Help.    Encounter Medications:  Outpatient Encounter Medications as of 07/16/2021  Medication Sig Note   amLODipine (NORVASC) 5 MG tablet Take 5 mg by mouth daily.    apixaban (ELIQUIS) 5 MG TABS tablet Take 5 mg by mouth 2 (two) times daily. 07/14/2021: Last dose was 9.11.22   Ascorbic Acid (VITAMIN C ADULT GUMMIES PO) Take 2 tablets by mouth daily.    aspirin EC 81 MG tablet Take 81 mg by mouth daily. Swallow whole.    docusate sodium (COLACE) 100 MG capsule Take 1 capsule (100 mg total) by mouth 2 (two) times daily.    DULoxetine (CYMBALTA) 20 MG capsule Take 20 mg by mouth every morning.    folic acid (FOLVITE) 1 MG tablet Take 1 tablet (1 mg total) by mouth daily.    gabapentin (NEURONTIN) 300 MG capsule Take 300 mg by mouth daily.    losartan (COZAAR) 100 MG tablet Take 100 mg by mouth daily.    metFORMIN (GLUCOPHAGE-XR) 500 MG 24 hr tablet Take 1,000 mg by mouth daily with breakfast.    methotrexate (RHEUMATREX) 2.5 MG tablet Take 8 tablets (20 mg total) by mouth once a week. 8 tablets weekly (Sunday)    molnupiravir EUA (LAGEVRIO) 200 MG CAPS  capsule Take 4 capsules (800 mg total) by mouth 2 (two) times daily for 5 days.    naproxen sodium (ALEVE) 220 MG tablet Take 440 mg by mouth daily as needed (pain).    pantoprazole (PROTONIX) 40 MG tablet Take 1 tablet (40 mg total) by mouth daily.    polyethylene glycol (MIRALAX / GLYCOLAX) 17 g packet Take 17 g by mouth 2 (two) times daily.    rosuvastatin (CRESTOR) 10 MG tablet Take 1 tablet (10 mg total) by mouth daily.    simethicone (MYLICON) 80 MG chewable tablet Chew 1 tablet (80 mg total) by mouth 4 (four) times daily.    No facility-administered encounter medications on file as of 07/16/2021.    Functional Status:  In your present state of health, do you have any difficulty performing the following activities: 07/02/2021 05/31/2021  Hearing? N N  Vision? N N  Difficulty concentrating or making decisions? N N  Walking or climbing stairs? Y Y  Dressing or bathing? N Y  Doing errands, shopping? Y N  Some recent data might be hidden    Fall/Depression Screening: Fall Risk  07/16/2021 08/14/2019 11/29/2018  Falls in the past year? 0 1 0  Number falls in past yr: 0 0 -  Injury with Fall? 0 1 -  Comment - abrasion on neck -  Risk Factor Category  - - -  Follow up - - Falls evaluation completed   PHQ 2/9 Scores 07/16/2021  PHQ - 2 Score 0    Assessment:   Care Plan Care Plan : General Plan of Care (Adult)  Updates made by [name], RN since 07/18/2021 12:00 AM     Problem: Risk for hospitalization related to Covid complications   Priority: High     Long-Range Goal: Patient will no report recovery from Covid and its complications as evidenced by no hospital readmissions within the next 3 months   Start Date: 07/16/2021  Expected End Date: 10/15/2021  Priority: High  Note:   Current Barriers:  Knowledge Deficits related to plan of care for management of Covid  Chronic Disease Management support and education needs related to Covid  RNCM Clinical Goal(s):  Patient  will verbalize understanding of plan for management of Covid take all medications exactly as prescribed and will call provider for medication related questions attend all scheduled medical appointments: PCP and Cardiology not experience hospital admission. Hospital Admissions in last 6 months = 6  through collaboration with RN Care manager, provider, and care team.   Interventions: 1:1 collaboration with primary care provider regarding development and update of comprehensive plan of care as evidenced by provider attestation and co-signature Inter-disciplinary care team collaboration (see longitudinal plan of care) Evaluation of current treatment plan related to  self management and patient's adherence to plan as established by provider   COVID:  (Status: New goal.) Provided education to patient to enhance basic understanding of COVID-19 as a viral disease, measures to prevent exposure, signs and symptoms, recommended vaccine schedule, when to contact provider;, Provided assistance to patient as requested to obtain appointment for COVID vaccine;, Screening for signs and symptoms of depression; , and Discussed effects, symptoms, and management of "long COVID"';  Patient Goals/Self-Care Activities: Patient will self administer medications as prescribed Patient will attend all scheduled provider appointments Patient will continue to perform ADL's independently        Goals Addressed             This Visit's Progress    [facility] - Make and Keep All Appointments       Timeframe:  Short-Term Goal Priority:  Medium Start Date:             9/15                Expected End Date:          11/15             Follow Up Date 07/30/2021  Barriers: Knowledge     - ask family or friend for a ride - call to cancel if needed - keep a calendar with appointment dates    Why is this important?   Part of staying healthy is seeing the doctor for follow-up care.  If you forget your appointments, there  are some things you can do to stay on track.    Notes:   9/15 - Member had appointment with vascular for test and office visit on 9/16, will need to reschedule due to positive covid test.  She has appointment next week for PCP and on 11/10 with cardiology.  Family will provide transportation     [place] My Quality of Life       Timeframe:  Long-Range Goal Priority:  High Start Date:         9/15  Expected End Date:         12/15              Follow Up Date 07/30/2021  Barriers: Knowledge     - discuss my treatment options with the doctor or nurse - make shared treatment decisions with doctor    Why is this important?   Having a long-term illness can be scary.  It can also be stressful for you and your caregiver.  These steps may help.    Notes:   9/15 - Patient will have [facility] for [facility] PT and nursing, will have first visit on Monday.  For management of chronic conditions, she monitors blood pressure daily (today was 130/77).  A1C is managed at 7.8        Plan:  Follow-up: Patient agrees to Care Plan and Follow-up. Follow-up in 2 week(s).  Will notify PCP of [facility] involvement.  Will send Covid education.  [name], [place], MSN [place] [phone]

## 2021-07-17 ENCOUNTER — Encounter (HOSPITAL_COMMUNITY): Payer: Medicare Other

## 2021-07-17 LAB — CULTURE, BLOOD (ROUTINE X 2)
Culture: NO GROWTH
Culture: NO GROWTH
Special Requests: ADEQUATE
Special Requests: ADEQUATE

## 2021-07-22 DIAGNOSIS — Z7984 Long term (current) use of oral hypoglycemic drugs: Secondary | ICD-10-CM | POA: Diagnosis not present

## 2021-07-22 DIAGNOSIS — I82402 Acute embolism and thrombosis of unspecified deep veins of left lower extremity: Secondary | ICD-10-CM | POA: Diagnosis not present

## 2021-07-22 DIAGNOSIS — Z7982 Long term (current) use of aspirin: Secondary | ICD-10-CM | POA: Diagnosis not present

## 2021-07-22 DIAGNOSIS — I119 Hypertensive heart disease without heart failure: Secondary | ICD-10-CM | POA: Diagnosis not present

## 2021-07-22 DIAGNOSIS — I2510 Atherosclerotic heart disease of native coronary artery without angina pectoris: Secondary | ICD-10-CM | POA: Diagnosis not present

## 2021-07-22 DIAGNOSIS — E1140 Type 2 diabetes mellitus with diabetic neuropathy, unspecified: Secondary | ICD-10-CM | POA: Diagnosis not present

## 2021-07-22 DIAGNOSIS — M069 Rheumatoid arthritis, unspecified: Secondary | ICD-10-CM | POA: Diagnosis not present

## 2021-07-22 DIAGNOSIS — K5900 Constipation, unspecified: Secondary | ICD-10-CM | POA: Diagnosis not present

## 2021-07-22 DIAGNOSIS — F32A Depression, unspecified: Secondary | ICD-10-CM | POA: Diagnosis not present

## 2021-07-22 DIAGNOSIS — G8929 Other chronic pain: Secondary | ICD-10-CM | POA: Diagnosis not present

## 2021-07-22 DIAGNOSIS — J9811 Atelectasis: Secondary | ICD-10-CM | POA: Diagnosis not present

## 2021-07-22 DIAGNOSIS — M1990 Unspecified osteoarthritis, unspecified site: Secondary | ICD-10-CM | POA: Diagnosis not present

## 2021-07-22 DIAGNOSIS — Z7901 Long term (current) use of anticoagulants: Secondary | ICD-10-CM | POA: Diagnosis not present

## 2021-07-22 DIAGNOSIS — K56600 Partial intestinal obstruction, unspecified as to cause: Secondary | ICD-10-CM | POA: Diagnosis not present

## 2021-07-22 DIAGNOSIS — Z96611 Presence of right artificial shoulder joint: Secondary | ICD-10-CM | POA: Diagnosis not present

## 2021-07-22 DIAGNOSIS — M160 Bilateral primary osteoarthritis of hip: Secondary | ICD-10-CM | POA: Diagnosis not present

## 2021-07-22 DIAGNOSIS — I083 Combined rheumatic disorders of mitral, aortic and tricuspid valves: Secondary | ICD-10-CM | POA: Diagnosis not present

## 2021-07-22 DIAGNOSIS — U071 COVID-19: Secondary | ICD-10-CM | POA: Diagnosis not present

## 2021-07-22 DIAGNOSIS — K760 Fatty (change of) liver, not elsewhere classified: Secondary | ICD-10-CM | POA: Diagnosis not present

## 2021-07-22 DIAGNOSIS — M47814 Spondylosis without myelopathy or radiculopathy, thoracic region: Secondary | ICD-10-CM | POA: Diagnosis not present

## 2021-07-22 DIAGNOSIS — I700 Atherosclerosis of aorta: Secondary | ICD-10-CM | POA: Diagnosis not present

## 2021-07-22 DIAGNOSIS — K219 Gastro-esophageal reflux disease without esophagitis: Secondary | ICD-10-CM | POA: Diagnosis not present

## 2021-07-22 DIAGNOSIS — E1151 Type 2 diabetes mellitus with diabetic peripheral angiopathy without gangrene: Secondary | ICD-10-CM | POA: Diagnosis not present

## 2021-07-23 DIAGNOSIS — M069 Rheumatoid arthritis, unspecified: Secondary | ICD-10-CM | POA: Diagnosis not present

## 2021-07-23 DIAGNOSIS — G8929 Other chronic pain: Secondary | ICD-10-CM | POA: Diagnosis not present

## 2021-07-23 DIAGNOSIS — K760 Fatty (change of) liver, not elsewhere classified: Secondary | ICD-10-CM | POA: Diagnosis not present

## 2021-07-23 DIAGNOSIS — E1151 Type 2 diabetes mellitus with diabetic peripheral angiopathy without gangrene: Secondary | ICD-10-CM | POA: Diagnosis not present

## 2021-07-23 DIAGNOSIS — M47814 Spondylosis without myelopathy or radiculopathy, thoracic region: Secondary | ICD-10-CM | POA: Diagnosis not present

## 2021-07-23 DIAGNOSIS — E1140 Type 2 diabetes mellitus with diabetic neuropathy, unspecified: Secondary | ICD-10-CM | POA: Diagnosis not present

## 2021-07-23 DIAGNOSIS — U071 COVID-19: Secondary | ICD-10-CM | POA: Diagnosis not present

## 2021-07-23 DIAGNOSIS — Z7982 Long term (current) use of aspirin: Secondary | ICD-10-CM | POA: Diagnosis not present

## 2021-07-23 DIAGNOSIS — I083 Combined rheumatic disorders of mitral, aortic and tricuspid valves: Secondary | ICD-10-CM | POA: Diagnosis not present

## 2021-07-23 DIAGNOSIS — I82402 Acute embolism and thrombosis of unspecified deep veins of left lower extremity: Secondary | ICD-10-CM | POA: Diagnosis not present

## 2021-07-23 DIAGNOSIS — Z7984 Long term (current) use of oral hypoglycemic drugs: Secondary | ICD-10-CM | POA: Diagnosis not present

## 2021-07-23 DIAGNOSIS — I2510 Atherosclerotic heart disease of native coronary artery without angina pectoris: Secondary | ICD-10-CM | POA: Diagnosis not present

## 2021-07-23 DIAGNOSIS — K56600 Partial intestinal obstruction, unspecified as to cause: Secondary | ICD-10-CM | POA: Diagnosis not present

## 2021-07-23 DIAGNOSIS — J9811 Atelectasis: Secondary | ICD-10-CM | POA: Diagnosis not present

## 2021-07-23 DIAGNOSIS — K219 Gastro-esophageal reflux disease without esophagitis: Secondary | ICD-10-CM | POA: Diagnosis not present

## 2021-07-23 DIAGNOSIS — M1990 Unspecified osteoarthritis, unspecified site: Secondary | ICD-10-CM | POA: Diagnosis not present

## 2021-07-23 DIAGNOSIS — M160 Bilateral primary osteoarthritis of hip: Secondary | ICD-10-CM | POA: Diagnosis not present

## 2021-07-23 DIAGNOSIS — I700 Atherosclerosis of aorta: Secondary | ICD-10-CM | POA: Diagnosis not present

## 2021-07-23 DIAGNOSIS — Z96611 Presence of right artificial shoulder joint: Secondary | ICD-10-CM | POA: Diagnosis not present

## 2021-07-23 DIAGNOSIS — F32A Depression, unspecified: Secondary | ICD-10-CM | POA: Diagnosis not present

## 2021-07-23 DIAGNOSIS — Z7901 Long term (current) use of anticoagulants: Secondary | ICD-10-CM | POA: Diagnosis not present

## 2021-07-23 DIAGNOSIS — K5900 Constipation, unspecified: Secondary | ICD-10-CM | POA: Diagnosis not present

## 2021-07-23 DIAGNOSIS — I119 Hypertensive heart disease without heart failure: Secondary | ICD-10-CM | POA: Diagnosis not present

## 2021-07-25 DIAGNOSIS — M160 Bilateral primary osteoarthritis of hip: Secondary | ICD-10-CM | POA: Diagnosis not present

## 2021-07-25 DIAGNOSIS — M1990 Unspecified osteoarthritis, unspecified site: Secondary | ICD-10-CM | POA: Diagnosis not present

## 2021-07-25 DIAGNOSIS — Z7984 Long term (current) use of oral hypoglycemic drugs: Secondary | ICD-10-CM | POA: Diagnosis not present

## 2021-07-25 DIAGNOSIS — K219 Gastro-esophageal reflux disease without esophagitis: Secondary | ICD-10-CM | POA: Diagnosis not present

## 2021-07-25 DIAGNOSIS — K760 Fatty (change of) liver, not elsewhere classified: Secondary | ICD-10-CM | POA: Diagnosis not present

## 2021-07-25 DIAGNOSIS — I119 Hypertensive heart disease without heart failure: Secondary | ICD-10-CM | POA: Diagnosis not present

## 2021-07-25 DIAGNOSIS — M47814 Spondylosis without myelopathy or radiculopathy, thoracic region: Secondary | ICD-10-CM | POA: Diagnosis not present

## 2021-07-25 DIAGNOSIS — E1151 Type 2 diabetes mellitus with diabetic peripheral angiopathy without gangrene: Secondary | ICD-10-CM | POA: Diagnosis not present

## 2021-07-25 DIAGNOSIS — J9811 Atelectasis: Secondary | ICD-10-CM | POA: Diagnosis not present

## 2021-07-25 DIAGNOSIS — Z96611 Presence of right artificial shoulder joint: Secondary | ICD-10-CM | POA: Diagnosis not present

## 2021-07-25 DIAGNOSIS — F32A Depression, unspecified: Secondary | ICD-10-CM | POA: Diagnosis not present

## 2021-07-25 DIAGNOSIS — K5900 Constipation, unspecified: Secondary | ICD-10-CM | POA: Diagnosis not present

## 2021-07-25 DIAGNOSIS — I700 Atherosclerosis of aorta: Secondary | ICD-10-CM | POA: Diagnosis not present

## 2021-07-25 DIAGNOSIS — U071 COVID-19: Secondary | ICD-10-CM | POA: Diagnosis not present

## 2021-07-25 DIAGNOSIS — G8929 Other chronic pain: Secondary | ICD-10-CM | POA: Diagnosis not present

## 2021-07-25 DIAGNOSIS — I2510 Atherosclerotic heart disease of native coronary artery without angina pectoris: Secondary | ICD-10-CM | POA: Diagnosis not present

## 2021-07-25 DIAGNOSIS — Z7982 Long term (current) use of aspirin: Secondary | ICD-10-CM | POA: Diagnosis not present

## 2021-07-25 DIAGNOSIS — M069 Rheumatoid arthritis, unspecified: Secondary | ICD-10-CM | POA: Diagnosis not present

## 2021-07-25 DIAGNOSIS — I82402 Acute embolism and thrombosis of unspecified deep veins of left lower extremity: Secondary | ICD-10-CM | POA: Diagnosis not present

## 2021-07-25 DIAGNOSIS — K56600 Partial intestinal obstruction, unspecified as to cause: Secondary | ICD-10-CM | POA: Diagnosis not present

## 2021-07-25 DIAGNOSIS — Z7901 Long term (current) use of anticoagulants: Secondary | ICD-10-CM | POA: Diagnosis not present

## 2021-07-25 DIAGNOSIS — I083 Combined rheumatic disorders of mitral, aortic and tricuspid valves: Secondary | ICD-10-CM | POA: Diagnosis not present

## 2021-07-25 DIAGNOSIS — E1140 Type 2 diabetes mellitus with diabetic neuropathy, unspecified: Secondary | ICD-10-CM | POA: Diagnosis not present

## 2021-07-28 DIAGNOSIS — E1151 Type 2 diabetes mellitus with diabetic peripheral angiopathy without gangrene: Secondary | ICD-10-CM | POA: Diagnosis not present

## 2021-07-28 DIAGNOSIS — I119 Hypertensive heart disease without heart failure: Secondary | ICD-10-CM | POA: Diagnosis not present

## 2021-07-28 DIAGNOSIS — K760 Fatty (change of) liver, not elsewhere classified: Secondary | ICD-10-CM | POA: Diagnosis not present

## 2021-07-28 DIAGNOSIS — M1990 Unspecified osteoarthritis, unspecified site: Secondary | ICD-10-CM | POA: Diagnosis not present

## 2021-07-28 DIAGNOSIS — I700 Atherosclerosis of aorta: Secondary | ICD-10-CM | POA: Diagnosis not present

## 2021-07-28 DIAGNOSIS — K5904 Chronic idiopathic constipation: Secondary | ICD-10-CM | POA: Diagnosis not present

## 2021-07-28 DIAGNOSIS — Z7982 Long term (current) use of aspirin: Secondary | ICD-10-CM | POA: Diagnosis not present

## 2021-07-28 DIAGNOSIS — K219 Gastro-esophageal reflux disease without esophagitis: Secondary | ICD-10-CM | POA: Diagnosis not present

## 2021-07-28 DIAGNOSIS — I2510 Atherosclerotic heart disease of native coronary artery without angina pectoris: Secondary | ICD-10-CM | POA: Diagnosis not present

## 2021-07-28 DIAGNOSIS — Z7984 Long term (current) use of oral hypoglycemic drugs: Secondary | ICD-10-CM | POA: Diagnosis not present

## 2021-07-28 DIAGNOSIS — D649 Anemia, unspecified: Secondary | ICD-10-CM | POA: Diagnosis not present

## 2021-07-28 DIAGNOSIS — I82402 Acute embolism and thrombosis of unspecified deep veins of left lower extremity: Secondary | ICD-10-CM | POA: Diagnosis not present

## 2021-07-28 DIAGNOSIS — K56600 Partial intestinal obstruction, unspecified as to cause: Secondary | ICD-10-CM | POA: Diagnosis not present

## 2021-07-28 DIAGNOSIS — J9811 Atelectasis: Secondary | ICD-10-CM | POA: Diagnosis not present

## 2021-07-28 DIAGNOSIS — M47814 Spondylosis without myelopathy or radiculopathy, thoracic region: Secondary | ICD-10-CM | POA: Diagnosis not present

## 2021-07-28 DIAGNOSIS — M069 Rheumatoid arthritis, unspecified: Secondary | ICD-10-CM | POA: Diagnosis not present

## 2021-07-28 DIAGNOSIS — M160 Bilateral primary osteoarthritis of hip: Secondary | ICD-10-CM | POA: Diagnosis not present

## 2021-07-28 DIAGNOSIS — F32A Depression, unspecified: Secondary | ICD-10-CM | POA: Diagnosis not present

## 2021-07-28 DIAGNOSIS — K5900 Constipation, unspecified: Secondary | ICD-10-CM | POA: Diagnosis not present

## 2021-07-28 DIAGNOSIS — Z7901 Long term (current) use of anticoagulants: Secondary | ICD-10-CM | POA: Diagnosis not present

## 2021-07-28 DIAGNOSIS — Z96611 Presence of right artificial shoulder joint: Secondary | ICD-10-CM | POA: Diagnosis not present

## 2021-07-28 DIAGNOSIS — I083 Combined rheumatic disorders of mitral, aortic and tricuspid valves: Secondary | ICD-10-CM | POA: Diagnosis not present

## 2021-07-28 DIAGNOSIS — E1140 Type 2 diabetes mellitus with diabetic neuropathy, unspecified: Secondary | ICD-10-CM | POA: Diagnosis not present

## 2021-07-28 DIAGNOSIS — G8929 Other chronic pain: Secondary | ICD-10-CM | POA: Diagnosis not present

## 2021-07-28 DIAGNOSIS — U071 COVID-19: Secondary | ICD-10-CM | POA: Diagnosis not present

## 2021-07-28 DIAGNOSIS — R778 Other specified abnormalities of plasma proteins: Secondary | ICD-10-CM | POA: Diagnosis not present

## 2021-07-30 ENCOUNTER — Other Ambulatory Visit: Payer: Self-pay | Admitting: *Deleted

## 2021-07-30 DIAGNOSIS — G8929 Other chronic pain: Secondary | ICD-10-CM | POA: Diagnosis not present

## 2021-07-30 DIAGNOSIS — Z96611 Presence of right artificial shoulder joint: Secondary | ICD-10-CM | POA: Diagnosis not present

## 2021-07-30 DIAGNOSIS — M1990 Unspecified osteoarthritis, unspecified site: Secondary | ICD-10-CM | POA: Diagnosis not present

## 2021-07-30 DIAGNOSIS — M160 Bilateral primary osteoarthritis of hip: Secondary | ICD-10-CM | POA: Diagnosis not present

## 2021-07-30 DIAGNOSIS — E1151 Type 2 diabetes mellitus with diabetic peripheral angiopathy without gangrene: Secondary | ICD-10-CM | POA: Diagnosis not present

## 2021-07-30 DIAGNOSIS — J9811 Atelectasis: Secondary | ICD-10-CM | POA: Diagnosis not present

## 2021-07-30 DIAGNOSIS — M47814 Spondylosis without myelopathy or radiculopathy, thoracic region: Secondary | ICD-10-CM | POA: Diagnosis not present

## 2021-07-30 DIAGNOSIS — M069 Rheumatoid arthritis, unspecified: Secondary | ICD-10-CM | POA: Diagnosis not present

## 2021-07-30 DIAGNOSIS — I119 Hypertensive heart disease without heart failure: Secondary | ICD-10-CM | POA: Diagnosis not present

## 2021-07-30 DIAGNOSIS — I700 Atherosclerosis of aorta: Secondary | ICD-10-CM | POA: Diagnosis not present

## 2021-07-30 DIAGNOSIS — I82402 Acute embolism and thrombosis of unspecified deep veins of left lower extremity: Secondary | ICD-10-CM | POA: Diagnosis not present

## 2021-07-30 DIAGNOSIS — K56600 Partial intestinal obstruction, unspecified as to cause: Secondary | ICD-10-CM | POA: Diagnosis not present

## 2021-07-30 DIAGNOSIS — U071 COVID-19: Secondary | ICD-10-CM | POA: Diagnosis not present

## 2021-07-30 DIAGNOSIS — F32A Depression, unspecified: Secondary | ICD-10-CM | POA: Diagnosis not present

## 2021-07-30 DIAGNOSIS — Z7982 Long term (current) use of aspirin: Secondary | ICD-10-CM | POA: Diagnosis not present

## 2021-07-30 DIAGNOSIS — E1140 Type 2 diabetes mellitus with diabetic neuropathy, unspecified: Secondary | ICD-10-CM | POA: Diagnosis not present

## 2021-07-30 DIAGNOSIS — K5900 Constipation, unspecified: Secondary | ICD-10-CM | POA: Diagnosis not present

## 2021-07-30 DIAGNOSIS — K219 Gastro-esophageal reflux disease without esophagitis: Secondary | ICD-10-CM | POA: Diagnosis not present

## 2021-07-30 DIAGNOSIS — K760 Fatty (change of) liver, not elsewhere classified: Secondary | ICD-10-CM | POA: Diagnosis not present

## 2021-07-30 DIAGNOSIS — I083 Combined rheumatic disorders of mitral, aortic and tricuspid valves: Secondary | ICD-10-CM | POA: Diagnosis not present

## 2021-07-30 DIAGNOSIS — Z7901 Long term (current) use of anticoagulants: Secondary | ICD-10-CM | POA: Diagnosis not present

## 2021-07-30 DIAGNOSIS — Z7984 Long term (current) use of oral hypoglycemic drugs: Secondary | ICD-10-CM | POA: Diagnosis not present

## 2021-07-30 DIAGNOSIS — I2510 Atherosclerotic heart disease of native coronary artery without angina pectoris: Secondary | ICD-10-CM | POA: Diagnosis not present

## 2021-07-30 NOTE — Patient Outreach (Signed)
[place] [facility]) Care Management  07/30/2021  Diane [name] [birth year] [id number]   Outgoing call placed to member to complete initial assessment, no answer, HIPAA compliant voice message left.  Will follow up within the next 3-4 business days.  [name], [place], MSN [place] [phone]

## 2021-07-31 DIAGNOSIS — K5900 Constipation, unspecified: Secondary | ICD-10-CM | POA: Diagnosis not present

## 2021-07-31 DIAGNOSIS — E782 Mixed hyperlipidemia: Secondary | ICD-10-CM | POA: Diagnosis not present

## 2021-07-31 DIAGNOSIS — M069 Rheumatoid arthritis, unspecified: Secondary | ICD-10-CM | POA: Diagnosis not present

## 2021-07-31 DIAGNOSIS — E1140 Type 2 diabetes mellitus with diabetic neuropathy, unspecified: Secondary | ICD-10-CM | POA: Diagnosis not present

## 2021-07-31 DIAGNOSIS — G8929 Other chronic pain: Secondary | ICD-10-CM | POA: Diagnosis not present

## 2021-07-31 DIAGNOSIS — I083 Combined rheumatic disorders of mitral, aortic and tricuspid valves: Secondary | ICD-10-CM | POA: Diagnosis not present

## 2021-07-31 DIAGNOSIS — I82402 Acute embolism and thrombosis of unspecified deep veins of left lower extremity: Secondary | ICD-10-CM | POA: Diagnosis not present

## 2021-07-31 DIAGNOSIS — M160 Bilateral primary osteoarthritis of hip: Secondary | ICD-10-CM | POA: Diagnosis not present

## 2021-07-31 DIAGNOSIS — Z7901 Long term (current) use of anticoagulants: Secondary | ICD-10-CM | POA: Diagnosis not present

## 2021-07-31 DIAGNOSIS — U071 COVID-19: Secondary | ICD-10-CM | POA: Diagnosis not present

## 2021-07-31 DIAGNOSIS — M1990 Unspecified osteoarthritis, unspecified site: Secondary | ICD-10-CM | POA: Diagnosis not present

## 2021-07-31 DIAGNOSIS — Z7982 Long term (current) use of aspirin: Secondary | ICD-10-CM | POA: Diagnosis not present

## 2021-07-31 DIAGNOSIS — E1151 Type 2 diabetes mellitus with diabetic peripheral angiopathy without gangrene: Secondary | ICD-10-CM | POA: Diagnosis not present

## 2021-07-31 DIAGNOSIS — K56600 Partial intestinal obstruction, unspecified as to cause: Secondary | ICD-10-CM | POA: Diagnosis not present

## 2021-07-31 DIAGNOSIS — E1165 Type 2 diabetes mellitus with hyperglycemia: Secondary | ICD-10-CM | POA: Diagnosis not present

## 2021-07-31 DIAGNOSIS — I700 Atherosclerosis of aorta: Secondary | ICD-10-CM | POA: Diagnosis not present

## 2021-07-31 DIAGNOSIS — M47814 Spondylosis without myelopathy or radiculopathy, thoracic region: Secondary | ICD-10-CM | POA: Diagnosis not present

## 2021-07-31 DIAGNOSIS — Z7984 Long term (current) use of oral hypoglycemic drugs: Secondary | ICD-10-CM | POA: Diagnosis not present

## 2021-07-31 DIAGNOSIS — K760 Fatty (change of) liver, not elsewhere classified: Secondary | ICD-10-CM | POA: Diagnosis not present

## 2021-07-31 DIAGNOSIS — K219 Gastro-esophageal reflux disease without esophagitis: Secondary | ICD-10-CM | POA: Diagnosis not present

## 2021-07-31 DIAGNOSIS — I10 Essential (primary) hypertension: Secondary | ICD-10-CM | POA: Diagnosis not present

## 2021-07-31 DIAGNOSIS — J9811 Atelectasis: Secondary | ICD-10-CM | POA: Diagnosis not present

## 2021-07-31 DIAGNOSIS — Z96611 Presence of right artificial shoulder joint: Secondary | ICD-10-CM | POA: Diagnosis not present

## 2021-07-31 DIAGNOSIS — F32A Depression, unspecified: Secondary | ICD-10-CM | POA: Diagnosis not present

## 2021-07-31 DIAGNOSIS — I2510 Atherosclerotic heart disease of native coronary artery without angina pectoris: Secondary | ICD-10-CM | POA: Diagnosis not present

## 2021-07-31 DIAGNOSIS — I119 Hypertensive heart disease without heart failure: Secondary | ICD-10-CM | POA: Diagnosis not present

## 2021-08-04 ENCOUNTER — Other Ambulatory Visit: Payer: Self-pay | Admitting: *Deleted

## 2021-08-04 DIAGNOSIS — I82402 Acute embolism and thrombosis of unspecified deep veins of left lower extremity: Secondary | ICD-10-CM | POA: Diagnosis not present

## 2021-08-04 DIAGNOSIS — M160 Bilateral primary osteoarthritis of hip: Secondary | ICD-10-CM | POA: Diagnosis not present

## 2021-08-04 DIAGNOSIS — E1140 Type 2 diabetes mellitus with diabetic neuropathy, unspecified: Secondary | ICD-10-CM | POA: Diagnosis not present

## 2021-08-04 DIAGNOSIS — M47814 Spondylosis without myelopathy or radiculopathy, thoracic region: Secondary | ICD-10-CM | POA: Diagnosis not present

## 2021-08-04 DIAGNOSIS — K56600 Partial intestinal obstruction, unspecified as to cause: Secondary | ICD-10-CM | POA: Diagnosis not present

## 2021-08-04 DIAGNOSIS — Z7984 Long term (current) use of oral hypoglycemic drugs: Secondary | ICD-10-CM | POA: Diagnosis not present

## 2021-08-04 DIAGNOSIS — M1990 Unspecified osteoarthritis, unspecified site: Secondary | ICD-10-CM | POA: Diagnosis not present

## 2021-08-04 DIAGNOSIS — M069 Rheumatoid arthritis, unspecified: Secondary | ICD-10-CM | POA: Diagnosis not present

## 2021-08-04 DIAGNOSIS — I2510 Atherosclerotic heart disease of native coronary artery without angina pectoris: Secondary | ICD-10-CM | POA: Diagnosis not present

## 2021-08-04 DIAGNOSIS — Z7982 Long term (current) use of aspirin: Secondary | ICD-10-CM | POA: Diagnosis not present

## 2021-08-04 DIAGNOSIS — F32A Depression, unspecified: Secondary | ICD-10-CM | POA: Diagnosis not present

## 2021-08-04 DIAGNOSIS — I700 Atherosclerosis of aorta: Secondary | ICD-10-CM | POA: Diagnosis not present

## 2021-08-04 DIAGNOSIS — I119 Hypertensive heart disease without heart failure: Secondary | ICD-10-CM | POA: Diagnosis not present

## 2021-08-04 DIAGNOSIS — K5900 Constipation, unspecified: Secondary | ICD-10-CM | POA: Diagnosis not present

## 2021-08-04 DIAGNOSIS — K760 Fatty (change of) liver, not elsewhere classified: Secondary | ICD-10-CM | POA: Diagnosis not present

## 2021-08-04 DIAGNOSIS — Z7901 Long term (current) use of anticoagulants: Secondary | ICD-10-CM | POA: Diagnosis not present

## 2021-08-04 DIAGNOSIS — K219 Gastro-esophageal reflux disease without esophagitis: Secondary | ICD-10-CM | POA: Diagnosis not present

## 2021-08-04 DIAGNOSIS — U071 COVID-19: Secondary | ICD-10-CM | POA: Diagnosis not present

## 2021-08-04 DIAGNOSIS — E1151 Type 2 diabetes mellitus with diabetic peripheral angiopathy without gangrene: Secondary | ICD-10-CM | POA: Diagnosis not present

## 2021-08-04 DIAGNOSIS — Z96611 Presence of right artificial shoulder joint: Secondary | ICD-10-CM | POA: Diagnosis not present

## 2021-08-04 DIAGNOSIS — G8929 Other chronic pain: Secondary | ICD-10-CM | POA: Diagnosis not present

## 2021-08-04 DIAGNOSIS — I083 Combined rheumatic disorders of mitral, aortic and tricuspid valves: Secondary | ICD-10-CM | POA: Diagnosis not present

## 2021-08-04 DIAGNOSIS — J9811 Atelectasis: Secondary | ICD-10-CM | POA: Diagnosis not present

## 2021-08-04 NOTE — Patient Outreach (Signed)
[place] [facility]) Care Management  08/04/2021  [place] January 29, 1946 [id number]   Outreach attempt #2, unsuccessful, HIPAA compliant voice message left.  Will send outreach letter and follow up within the next 3-4 business days.  [name], [place], MSN [place] [phone]

## 2021-08-05 ENCOUNTER — Ambulatory Visit: Payer: Self-pay | Admitting: *Deleted

## 2021-08-05 DIAGNOSIS — H5212 Myopia, left eye: Secondary | ICD-10-CM | POA: Diagnosis not present

## 2021-08-05 DIAGNOSIS — H33321 Round hole, right eye: Secondary | ICD-10-CM | POA: Diagnosis not present

## 2021-08-05 DIAGNOSIS — H43813 Vitreous degeneration, bilateral: Secondary | ICD-10-CM | POA: Diagnosis not present

## 2021-08-05 DIAGNOSIS — Z961 Presence of intraocular lens: Secondary | ICD-10-CM | POA: Diagnosis not present

## 2021-08-06 DIAGNOSIS — H35373 Puckering of macula, bilateral: Secondary | ICD-10-CM | POA: Diagnosis not present

## 2021-08-06 DIAGNOSIS — K5900 Constipation, unspecified: Secondary | ICD-10-CM | POA: Diagnosis not present

## 2021-08-06 DIAGNOSIS — K760 Fatty (change of) liver, not elsewhere classified: Secondary | ICD-10-CM | POA: Diagnosis not present

## 2021-08-06 DIAGNOSIS — H43393 Other vitreous opacities, bilateral: Secondary | ICD-10-CM | POA: Diagnosis not present

## 2021-08-06 DIAGNOSIS — I82402 Acute embolism and thrombosis of unspecified deep veins of left lower extremity: Secondary | ICD-10-CM | POA: Diagnosis not present

## 2021-08-06 DIAGNOSIS — U071 COVID-19: Secondary | ICD-10-CM | POA: Diagnosis not present

## 2021-08-06 DIAGNOSIS — Z7901 Long term (current) use of anticoagulants: Secondary | ICD-10-CM | POA: Diagnosis not present

## 2021-08-06 DIAGNOSIS — I700 Atherosclerosis of aorta: Secondary | ICD-10-CM | POA: Diagnosis not present

## 2021-08-06 DIAGNOSIS — M1990 Unspecified osteoarthritis, unspecified site: Secondary | ICD-10-CM | POA: Diagnosis not present

## 2021-08-06 DIAGNOSIS — Z7984 Long term (current) use of oral hypoglycemic drugs: Secondary | ICD-10-CM | POA: Diagnosis not present

## 2021-08-06 DIAGNOSIS — H33333 Multiple defects of retina without detachment, bilateral: Secondary | ICD-10-CM | POA: Diagnosis not present

## 2021-08-06 DIAGNOSIS — Z96611 Presence of right artificial shoulder joint: Secondary | ICD-10-CM | POA: Diagnosis not present

## 2021-08-06 DIAGNOSIS — G8929 Other chronic pain: Secondary | ICD-10-CM | POA: Diagnosis not present

## 2021-08-06 DIAGNOSIS — I083 Combined rheumatic disorders of mitral, aortic and tricuspid valves: Secondary | ICD-10-CM | POA: Diagnosis not present

## 2021-08-06 DIAGNOSIS — M069 Rheumatoid arthritis, unspecified: Secondary | ICD-10-CM | POA: Diagnosis not present

## 2021-08-06 DIAGNOSIS — K56600 Partial intestinal obstruction, unspecified as to cause: Secondary | ICD-10-CM | POA: Diagnosis not present

## 2021-08-06 DIAGNOSIS — M160 Bilateral primary osteoarthritis of hip: Secondary | ICD-10-CM | POA: Diagnosis not present

## 2021-08-06 DIAGNOSIS — I119 Hypertensive heart disease without heart failure: Secondary | ICD-10-CM | POA: Diagnosis not present

## 2021-08-06 DIAGNOSIS — E1151 Type 2 diabetes mellitus with diabetic peripheral angiopathy without gangrene: Secondary | ICD-10-CM | POA: Diagnosis not present

## 2021-08-06 DIAGNOSIS — J9811 Atelectasis: Secondary | ICD-10-CM | POA: Diagnosis not present

## 2021-08-06 DIAGNOSIS — Z7982 Long term (current) use of aspirin: Secondary | ICD-10-CM | POA: Diagnosis not present

## 2021-08-06 DIAGNOSIS — I2510 Atherosclerotic heart disease of native coronary artery without angina pectoris: Secondary | ICD-10-CM | POA: Diagnosis not present

## 2021-08-06 DIAGNOSIS — M47814 Spondylosis without myelopathy or radiculopathy, thoracic region: Secondary | ICD-10-CM | POA: Diagnosis not present

## 2021-08-06 DIAGNOSIS — H43813 Vitreous degeneration, bilateral: Secondary | ICD-10-CM | POA: Diagnosis not present

## 2021-08-06 DIAGNOSIS — E1140 Type 2 diabetes mellitus with diabetic neuropathy, unspecified: Secondary | ICD-10-CM | POA: Diagnosis not present

## 2021-08-06 DIAGNOSIS — K219 Gastro-esophageal reflux disease without esophagitis: Secondary | ICD-10-CM | POA: Diagnosis not present

## 2021-08-06 DIAGNOSIS — F32A Depression, unspecified: Secondary | ICD-10-CM | POA: Diagnosis not present

## 2021-08-07 ENCOUNTER — Other Ambulatory Visit: Payer: Self-pay | Admitting: *Deleted

## 2021-08-07 DIAGNOSIS — G8929 Other chronic pain: Secondary | ICD-10-CM | POA: Diagnosis not present

## 2021-08-07 DIAGNOSIS — E1151 Type 2 diabetes mellitus with diabetic peripheral angiopathy without gangrene: Secondary | ICD-10-CM | POA: Diagnosis not present

## 2021-08-07 DIAGNOSIS — Z96611 Presence of right artificial shoulder joint: Secondary | ICD-10-CM | POA: Diagnosis not present

## 2021-08-07 DIAGNOSIS — M069 Rheumatoid arthritis, unspecified: Secondary | ICD-10-CM | POA: Diagnosis not present

## 2021-08-07 DIAGNOSIS — M160 Bilateral primary osteoarthritis of hip: Secondary | ICD-10-CM | POA: Diagnosis not present

## 2021-08-07 DIAGNOSIS — F32A Depression, unspecified: Secondary | ICD-10-CM | POA: Diagnosis not present

## 2021-08-07 DIAGNOSIS — Z7984 Long term (current) use of oral hypoglycemic drugs: Secondary | ICD-10-CM | POA: Diagnosis not present

## 2021-08-07 DIAGNOSIS — M1990 Unspecified osteoarthritis, unspecified site: Secondary | ICD-10-CM | POA: Diagnosis not present

## 2021-08-07 DIAGNOSIS — K56600 Partial intestinal obstruction, unspecified as to cause: Secondary | ICD-10-CM | POA: Diagnosis not present

## 2021-08-07 DIAGNOSIS — E1140 Type 2 diabetes mellitus with diabetic neuropathy, unspecified: Secondary | ICD-10-CM | POA: Diagnosis not present

## 2021-08-07 DIAGNOSIS — J9811 Atelectasis: Secondary | ICD-10-CM | POA: Diagnosis not present

## 2021-08-07 DIAGNOSIS — I083 Combined rheumatic disorders of mitral, aortic and tricuspid valves: Secondary | ICD-10-CM | POA: Diagnosis not present

## 2021-08-07 DIAGNOSIS — M47814 Spondylosis without myelopathy or radiculopathy, thoracic region: Secondary | ICD-10-CM | POA: Diagnosis not present

## 2021-08-07 DIAGNOSIS — K760 Fatty (change of) liver, not elsewhere classified: Secondary | ICD-10-CM | POA: Diagnosis not present

## 2021-08-07 DIAGNOSIS — Z7982 Long term (current) use of aspirin: Secondary | ICD-10-CM | POA: Diagnosis not present

## 2021-08-07 DIAGNOSIS — I2510 Atherosclerotic heart disease of native coronary artery without angina pectoris: Secondary | ICD-10-CM | POA: Diagnosis not present

## 2021-08-07 DIAGNOSIS — K219 Gastro-esophageal reflux disease without esophagitis: Secondary | ICD-10-CM | POA: Diagnosis not present

## 2021-08-07 DIAGNOSIS — Z7901 Long term (current) use of anticoagulants: Secondary | ICD-10-CM | POA: Diagnosis not present

## 2021-08-07 DIAGNOSIS — I700 Atherosclerosis of aorta: Secondary | ICD-10-CM | POA: Diagnosis not present

## 2021-08-07 DIAGNOSIS — U071 COVID-19: Secondary | ICD-10-CM | POA: Diagnosis not present

## 2021-08-07 DIAGNOSIS — I119 Hypertensive heart disease without heart failure: Secondary | ICD-10-CM | POA: Diagnosis not present

## 2021-08-07 DIAGNOSIS — K5900 Constipation, unspecified: Secondary | ICD-10-CM | POA: Diagnosis not present

## 2021-08-07 DIAGNOSIS — I82402 Acute embolism and thrombosis of unspecified deep veins of left lower extremity: Secondary | ICD-10-CM | POA: Diagnosis not present

## 2021-08-07 NOTE — Patient Outreach (Signed)
[place] [facility]) Care Management  08/07/2021  Shelly 1946-09-29 [id number]   Outreach attempt #3, unsuccessful, HIPAA compliant voice message left.  Will follow up within the next 3 weeks.  [name], [place], MSN [place] [phone]

## 2021-08-11 DIAGNOSIS — K5904 Chronic idiopathic constipation: Secondary | ICD-10-CM | POA: Diagnosis not present

## 2021-08-11 DIAGNOSIS — K760 Fatty (change of) liver, not elsewhere classified: Secondary | ICD-10-CM | POA: Diagnosis not present

## 2021-08-11 DIAGNOSIS — K219 Gastro-esophageal reflux disease without esophagitis: Secondary | ICD-10-CM | POA: Diagnosis not present

## 2021-08-11 DIAGNOSIS — K5730 Diverticulosis of large intestine without perforation or abscess without bleeding: Secondary | ICD-10-CM | POA: Diagnosis not present

## 2021-08-12 DIAGNOSIS — G8929 Other chronic pain: Secondary | ICD-10-CM | POA: Diagnosis not present

## 2021-08-12 DIAGNOSIS — K5900 Constipation, unspecified: Secondary | ICD-10-CM | POA: Diagnosis not present

## 2021-08-12 DIAGNOSIS — E1151 Type 2 diabetes mellitus with diabetic peripheral angiopathy without gangrene: Secondary | ICD-10-CM | POA: Diagnosis not present

## 2021-08-12 DIAGNOSIS — Z86718 Personal history of other venous thrombosis and embolism: Secondary | ICD-10-CM | POA: Diagnosis not present

## 2021-08-12 DIAGNOSIS — I82402 Acute embolism and thrombosis of unspecified deep veins of left lower extremity: Secondary | ICD-10-CM | POA: Diagnosis not present

## 2021-08-12 DIAGNOSIS — M069 Rheumatoid arthritis, unspecified: Secondary | ICD-10-CM | POA: Diagnosis not present

## 2021-08-12 DIAGNOSIS — M1990 Unspecified osteoarthritis, unspecified site: Secondary | ICD-10-CM | POA: Diagnosis not present

## 2021-08-12 DIAGNOSIS — I10 Essential (primary) hypertension: Secondary | ICD-10-CM | POA: Diagnosis not present

## 2021-08-12 DIAGNOSIS — I119 Hypertensive heart disease without heart failure: Secondary | ICD-10-CM | POA: Diagnosis not present

## 2021-08-12 DIAGNOSIS — Z7982 Long term (current) use of aspirin: Secondary | ICD-10-CM | POA: Diagnosis not present

## 2021-08-12 DIAGNOSIS — I083 Combined rheumatic disorders of mitral, aortic and tricuspid valves: Secondary | ICD-10-CM | POA: Diagnosis not present

## 2021-08-12 DIAGNOSIS — Z7984 Long term (current) use of oral hypoglycemic drugs: Secondary | ICD-10-CM | POA: Diagnosis not present

## 2021-08-12 DIAGNOSIS — M47814 Spondylosis without myelopathy or radiculopathy, thoracic region: Secondary | ICD-10-CM | POA: Diagnosis not present

## 2021-08-12 DIAGNOSIS — K219 Gastro-esophageal reflux disease without esophagitis: Secondary | ICD-10-CM | POA: Diagnosis not present

## 2021-08-12 DIAGNOSIS — I700 Atherosclerosis of aorta: Secondary | ICD-10-CM | POA: Diagnosis not present

## 2021-08-12 DIAGNOSIS — K760 Fatty (change of) liver, not elsewhere classified: Secondary | ICD-10-CM | POA: Diagnosis not present

## 2021-08-12 DIAGNOSIS — K56600 Partial intestinal obstruction, unspecified as to cause: Secondary | ICD-10-CM | POA: Diagnosis not present

## 2021-08-12 DIAGNOSIS — I2510 Atherosclerotic heart disease of native coronary artery without angina pectoris: Secondary | ICD-10-CM | POA: Diagnosis not present

## 2021-08-12 DIAGNOSIS — Z96611 Presence of right artificial shoulder joint: Secondary | ICD-10-CM | POA: Diagnosis not present

## 2021-08-12 DIAGNOSIS — J9811 Atelectasis: Secondary | ICD-10-CM | POA: Diagnosis not present

## 2021-08-12 DIAGNOSIS — F32A Depression, unspecified: Secondary | ICD-10-CM | POA: Diagnosis not present

## 2021-08-12 DIAGNOSIS — E1140 Type 2 diabetes mellitus with diabetic neuropathy, unspecified: Secondary | ICD-10-CM | POA: Diagnosis not present

## 2021-08-12 DIAGNOSIS — I872 Venous insufficiency (chronic) (peripheral): Secondary | ICD-10-CM | POA: Diagnosis not present

## 2021-08-12 DIAGNOSIS — U071 COVID-19: Secondary | ICD-10-CM | POA: Diagnosis not present

## 2021-08-12 DIAGNOSIS — N3281 Overactive bladder: Secondary | ICD-10-CM | POA: Diagnosis not present

## 2021-08-12 DIAGNOSIS — Z7901 Long term (current) use of anticoagulants: Secondary | ICD-10-CM | POA: Diagnosis not present

## 2021-08-12 DIAGNOSIS — M160 Bilateral primary osteoarthritis of hip: Secondary | ICD-10-CM | POA: Diagnosis not present

## 2021-08-13 DIAGNOSIS — Z79899 Other long term (current) drug therapy: Secondary | ICD-10-CM | POA: Diagnosis not present

## 2021-08-13 DIAGNOSIS — M7989 Other specified soft tissue disorders: Secondary | ICD-10-CM | POA: Diagnosis not present

## 2021-08-13 DIAGNOSIS — D509 Iron deficiency anemia, unspecified: Secondary | ICD-10-CM | POA: Diagnosis not present

## 2021-08-13 DIAGNOSIS — K760 Fatty (change of) liver, not elsewhere classified: Secondary | ICD-10-CM | POA: Diagnosis not present

## 2021-08-13 DIAGNOSIS — M349 Systemic sclerosis, unspecified: Secondary | ICD-10-CM | POA: Diagnosis not present

## 2021-08-13 DIAGNOSIS — M0579 Rheumatoid arthritis with rheumatoid factor of multiple sites without organ or systems involvement: Secondary | ICD-10-CM | POA: Diagnosis not present

## 2021-08-13 DIAGNOSIS — R252 Cramp and spasm: Secondary | ICD-10-CM | POA: Diagnosis not present

## 2021-08-13 DIAGNOSIS — M79643 Pain in unspecified hand: Secondary | ICD-10-CM | POA: Diagnosis not present

## 2021-08-13 DIAGNOSIS — M1990 Unspecified osteoarthritis, unspecified site: Secondary | ICD-10-CM | POA: Diagnosis not present

## 2021-08-14 DIAGNOSIS — E1151 Type 2 diabetes mellitus with diabetic peripheral angiopathy without gangrene: Secondary | ICD-10-CM | POA: Diagnosis not present

## 2021-08-14 DIAGNOSIS — I119 Hypertensive heart disease without heart failure: Secondary | ICD-10-CM | POA: Diagnosis not present

## 2021-08-14 DIAGNOSIS — M160 Bilateral primary osteoarthritis of hip: Secondary | ICD-10-CM | POA: Diagnosis not present

## 2021-08-14 DIAGNOSIS — M1990 Unspecified osteoarthritis, unspecified site: Secondary | ICD-10-CM | POA: Diagnosis not present

## 2021-08-14 DIAGNOSIS — E1140 Type 2 diabetes mellitus with diabetic neuropathy, unspecified: Secondary | ICD-10-CM | POA: Diagnosis not present

## 2021-08-14 DIAGNOSIS — K56600 Partial intestinal obstruction, unspecified as to cause: Secondary | ICD-10-CM | POA: Diagnosis not present

## 2021-08-14 DIAGNOSIS — G8929 Other chronic pain: Secondary | ICD-10-CM | POA: Diagnosis not present

## 2021-08-14 DIAGNOSIS — I083 Combined rheumatic disorders of mitral, aortic and tricuspid valves: Secondary | ICD-10-CM | POA: Diagnosis not present

## 2021-08-14 DIAGNOSIS — Z7984 Long term (current) use of oral hypoglycemic drugs: Secondary | ICD-10-CM | POA: Diagnosis not present

## 2021-08-14 DIAGNOSIS — I2510 Atherosclerotic heart disease of native coronary artery without angina pectoris: Secondary | ICD-10-CM | POA: Diagnosis not present

## 2021-08-14 DIAGNOSIS — F32A Depression, unspecified: Secondary | ICD-10-CM | POA: Diagnosis not present

## 2021-08-14 DIAGNOSIS — U071 COVID-19: Secondary | ICD-10-CM | POA: Diagnosis not present

## 2021-08-14 DIAGNOSIS — Z7901 Long term (current) use of anticoagulants: Secondary | ICD-10-CM | POA: Diagnosis not present

## 2021-08-14 DIAGNOSIS — Z96611 Presence of right artificial shoulder joint: Secondary | ICD-10-CM | POA: Diagnosis not present

## 2021-08-14 DIAGNOSIS — M069 Rheumatoid arthritis, unspecified: Secondary | ICD-10-CM | POA: Diagnosis not present

## 2021-08-14 DIAGNOSIS — I700 Atherosclerosis of aorta: Secondary | ICD-10-CM | POA: Diagnosis not present

## 2021-08-14 DIAGNOSIS — J9811 Atelectasis: Secondary | ICD-10-CM | POA: Diagnosis not present

## 2021-08-14 DIAGNOSIS — K219 Gastro-esophageal reflux disease without esophagitis: Secondary | ICD-10-CM | POA: Diagnosis not present

## 2021-08-14 DIAGNOSIS — I82402 Acute embolism and thrombosis of unspecified deep veins of left lower extremity: Secondary | ICD-10-CM | POA: Diagnosis not present

## 2021-08-14 DIAGNOSIS — Z7982 Long term (current) use of aspirin: Secondary | ICD-10-CM | POA: Diagnosis not present

## 2021-08-14 DIAGNOSIS — K760 Fatty (change of) liver, not elsewhere classified: Secondary | ICD-10-CM | POA: Diagnosis not present

## 2021-08-14 DIAGNOSIS — M47814 Spondylosis without myelopathy or radiculopathy, thoracic region: Secondary | ICD-10-CM | POA: Diagnosis not present

## 2021-08-14 DIAGNOSIS — K5900 Constipation, unspecified: Secondary | ICD-10-CM | POA: Diagnosis not present

## 2021-08-18 DIAGNOSIS — Z7984 Long term (current) use of oral hypoglycemic drugs: Secondary | ICD-10-CM | POA: Diagnosis not present

## 2021-08-18 DIAGNOSIS — J9811 Atelectasis: Secondary | ICD-10-CM | POA: Diagnosis not present

## 2021-08-18 DIAGNOSIS — E1151 Type 2 diabetes mellitus with diabetic peripheral angiopathy without gangrene: Secondary | ICD-10-CM | POA: Diagnosis not present

## 2021-08-18 DIAGNOSIS — G8929 Other chronic pain: Secondary | ICD-10-CM | POA: Diagnosis not present

## 2021-08-18 DIAGNOSIS — E1140 Type 2 diabetes mellitus with diabetic neuropathy, unspecified: Secondary | ICD-10-CM | POA: Diagnosis not present

## 2021-08-18 DIAGNOSIS — K5900 Constipation, unspecified: Secondary | ICD-10-CM | POA: Diagnosis not present

## 2021-08-18 DIAGNOSIS — Z7901 Long term (current) use of anticoagulants: Secondary | ICD-10-CM | POA: Diagnosis not present

## 2021-08-18 DIAGNOSIS — M1990 Unspecified osteoarthritis, unspecified site: Secondary | ICD-10-CM | POA: Diagnosis not present

## 2021-08-18 DIAGNOSIS — U071 COVID-19: Secondary | ICD-10-CM | POA: Diagnosis not present

## 2021-08-18 DIAGNOSIS — F32A Depression, unspecified: Secondary | ICD-10-CM | POA: Diagnosis not present

## 2021-08-18 DIAGNOSIS — K56600 Partial intestinal obstruction, unspecified as to cause: Secondary | ICD-10-CM | POA: Diagnosis not present

## 2021-08-18 DIAGNOSIS — I083 Combined rheumatic disorders of mitral, aortic and tricuspid valves: Secondary | ICD-10-CM | POA: Diagnosis not present

## 2021-08-18 DIAGNOSIS — M069 Rheumatoid arthritis, unspecified: Secondary | ICD-10-CM | POA: Diagnosis not present

## 2021-08-18 DIAGNOSIS — K219 Gastro-esophageal reflux disease without esophagitis: Secondary | ICD-10-CM | POA: Diagnosis not present

## 2021-08-18 DIAGNOSIS — K760 Fatty (change of) liver, not elsewhere classified: Secondary | ICD-10-CM | POA: Diagnosis not present

## 2021-08-18 DIAGNOSIS — Z96611 Presence of right artificial shoulder joint: Secondary | ICD-10-CM | POA: Diagnosis not present

## 2021-08-18 DIAGNOSIS — I82402 Acute embolism and thrombosis of unspecified deep veins of left lower extremity: Secondary | ICD-10-CM | POA: Diagnosis not present

## 2021-08-18 DIAGNOSIS — M47814 Spondylosis without myelopathy or radiculopathy, thoracic region: Secondary | ICD-10-CM | POA: Diagnosis not present

## 2021-08-18 DIAGNOSIS — Z7982 Long term (current) use of aspirin: Secondary | ICD-10-CM | POA: Diagnosis not present

## 2021-08-18 DIAGNOSIS — I2510 Atherosclerotic heart disease of native coronary artery without angina pectoris: Secondary | ICD-10-CM | POA: Diagnosis not present

## 2021-08-18 DIAGNOSIS — I700 Atherosclerosis of aorta: Secondary | ICD-10-CM | POA: Diagnosis not present

## 2021-08-18 DIAGNOSIS — I119 Hypertensive heart disease without heart failure: Secondary | ICD-10-CM | POA: Diagnosis not present

## 2021-08-18 DIAGNOSIS — M160 Bilateral primary osteoarthritis of hip: Secondary | ICD-10-CM | POA: Diagnosis not present

## 2021-08-20 DIAGNOSIS — I083 Combined rheumatic disorders of mitral, aortic and tricuspid valves: Secondary | ICD-10-CM | POA: Diagnosis not present

## 2021-08-20 DIAGNOSIS — K56600 Partial intestinal obstruction, unspecified as to cause: Secondary | ICD-10-CM | POA: Diagnosis not present

## 2021-08-20 DIAGNOSIS — I82402 Acute embolism and thrombosis of unspecified deep veins of left lower extremity: Secondary | ICD-10-CM | POA: Diagnosis not present

## 2021-08-20 DIAGNOSIS — I119 Hypertensive heart disease without heart failure: Secondary | ICD-10-CM | POA: Diagnosis not present

## 2021-08-20 DIAGNOSIS — K219 Gastro-esophageal reflux disease without esophagitis: Secondary | ICD-10-CM | POA: Diagnosis not present

## 2021-08-20 DIAGNOSIS — Z7982 Long term (current) use of aspirin: Secondary | ICD-10-CM | POA: Diagnosis not present

## 2021-08-20 DIAGNOSIS — E1140 Type 2 diabetes mellitus with diabetic neuropathy, unspecified: Secondary | ICD-10-CM | POA: Diagnosis not present

## 2021-08-20 DIAGNOSIS — I2510 Atherosclerotic heart disease of native coronary artery without angina pectoris: Secondary | ICD-10-CM | POA: Diagnosis not present

## 2021-08-20 DIAGNOSIS — M1990 Unspecified osteoarthritis, unspecified site: Secondary | ICD-10-CM | POA: Diagnosis not present

## 2021-08-20 DIAGNOSIS — Z7984 Long term (current) use of oral hypoglycemic drugs: Secondary | ICD-10-CM | POA: Diagnosis not present

## 2021-08-20 DIAGNOSIS — U071 COVID-19: Secondary | ICD-10-CM | POA: Diagnosis not present

## 2021-08-20 DIAGNOSIS — Z96611 Presence of right artificial shoulder joint: Secondary | ICD-10-CM | POA: Diagnosis not present

## 2021-08-20 DIAGNOSIS — K5900 Constipation, unspecified: Secondary | ICD-10-CM | POA: Diagnosis not present

## 2021-08-20 DIAGNOSIS — Z7901 Long term (current) use of anticoagulants: Secondary | ICD-10-CM | POA: Diagnosis not present

## 2021-08-20 DIAGNOSIS — M160 Bilateral primary osteoarthritis of hip: Secondary | ICD-10-CM | POA: Diagnosis not present

## 2021-08-20 DIAGNOSIS — K760 Fatty (change of) liver, not elsewhere classified: Secondary | ICD-10-CM | POA: Diagnosis not present

## 2021-08-20 DIAGNOSIS — G8929 Other chronic pain: Secondary | ICD-10-CM | POA: Diagnosis not present

## 2021-08-20 DIAGNOSIS — M069 Rheumatoid arthritis, unspecified: Secondary | ICD-10-CM | POA: Diagnosis not present

## 2021-08-20 DIAGNOSIS — J9811 Atelectasis: Secondary | ICD-10-CM | POA: Diagnosis not present

## 2021-08-20 DIAGNOSIS — F32A Depression, unspecified: Secondary | ICD-10-CM | POA: Diagnosis not present

## 2021-08-20 DIAGNOSIS — M47814 Spondylosis without myelopathy or radiculopathy, thoracic region: Secondary | ICD-10-CM | POA: Diagnosis not present

## 2021-08-20 DIAGNOSIS — I700 Atherosclerosis of aorta: Secondary | ICD-10-CM | POA: Diagnosis not present

## 2021-08-20 DIAGNOSIS — E1151 Type 2 diabetes mellitus with diabetic peripheral angiopathy without gangrene: Secondary | ICD-10-CM | POA: Diagnosis not present

## 2021-08-26 ENCOUNTER — Other Ambulatory Visit: Payer: Self-pay | Admitting: *Deleted

## 2021-08-26 DIAGNOSIS — I10 Essential (primary) hypertension: Secondary | ICD-10-CM | POA: Diagnosis not present

## 2021-08-26 DIAGNOSIS — N3281 Overactive bladder: Secondary | ICD-10-CM | POA: Diagnosis not present

## 2021-08-26 NOTE — Patient Outreach (Signed)
[facility]) Care Management  08/26/2021  LURENE ROBLEY [birth year] [id number]   Outreach attempt #4, unsuccessful, HIPAA compliant voice message left.  Call placed to PCP office, notified that member is currently in the office.  Request made for nurse to speak with member about contact with this RNCM.  Will await call back, if no call back will close case due to inability to maintain contact.  [name], [place], MSN [place] [phone]

## 2021-08-27 ENCOUNTER — Ambulatory Visit: Payer: Self-pay | Admitting: *Deleted

## 2021-08-27 DIAGNOSIS — H33331 Multiple defects of retina without detachment, right eye: Secondary | ICD-10-CM | POA: Diagnosis not present

## 2021-08-31 DIAGNOSIS — E782 Mixed hyperlipidemia: Secondary | ICD-10-CM | POA: Diagnosis not present

## 2021-08-31 DIAGNOSIS — E1165 Type 2 diabetes mellitus with hyperglycemia: Secondary | ICD-10-CM | POA: Diagnosis not present

## 2021-08-31 DIAGNOSIS — M1990 Unspecified osteoarthritis, unspecified site: Secondary | ICD-10-CM | POA: Diagnosis not present

## 2021-08-31 DIAGNOSIS — I10 Essential (primary) hypertension: Secondary | ICD-10-CM | POA: Diagnosis not present

## 2021-09-01 ENCOUNTER — Other Ambulatory Visit: Payer: Self-pay | Admitting: *Deleted

## 2021-09-01 NOTE — Patient Outreach (Signed)
[name]) Care Management  09/01/2021  [name] [name] 07-10-1946 [id number]   No response from member after multiple unsuccessful outreach attempts and letter sent.  Will close case at this time due to inability to maintain contact.  Will notify member and primary MD of case closure.  [name], [place], MSN [name] [phone]
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
## 2021-09-10 ENCOUNTER — Ambulatory Visit: Payer: Medicare Other | Admitting: Physician Assistant

## 2021-09-10 DIAGNOSIS — R072 Precordial pain: Secondary | ICD-10-CM

## 2021-09-10 DIAGNOSIS — I10 Essential (primary) hypertension: Secondary | ICD-10-CM

## 2021-09-13 ENCOUNTER — Emergency Department (HOSPITAL_COMMUNITY)
Admission: EM | Admit: 2021-09-13 | Discharge: 2021-09-14 | Disposition: A | Discharge status: Home / Self Care | Payer: Medicare Other | Attending: Emergency Medicine | Admitting: Emergency Medicine

## 2021-09-13 ENCOUNTER — Other Ambulatory Visit: Payer: Self-pay

## 2021-09-13 DIAGNOSIS — Z20822 Contact with and (suspected) exposure to covid-19: Secondary | ICD-10-CM | POA: Diagnosis not present

## 2021-09-13 DIAGNOSIS — Z7901 Long term (current) use of anticoagulants: Secondary | ICD-10-CM | POA: Insufficient documentation

## 2021-09-13 DIAGNOSIS — Z7984 Long term (current) use of oral hypoglycemic drugs: Secondary | ICD-10-CM | POA: Insufficient documentation

## 2021-09-13 DIAGNOSIS — Z79899 Other long term (current) drug therapy: Secondary | ICD-10-CM | POA: Diagnosis not present

## 2021-09-13 DIAGNOSIS — E119 Type 2 diabetes mellitus without complications: Secondary | ICD-10-CM | POA: Diagnosis not present

## 2021-09-13 DIAGNOSIS — R109 Unspecified abdominal pain: Secondary | ICD-10-CM | POA: Diagnosis not present

## 2021-09-13 DIAGNOSIS — K5900 Constipation, unspecified: Secondary | ICD-10-CM | POA: Insufficient documentation

## 2021-09-13 DIAGNOSIS — R1084 Generalized abdominal pain: Secondary | ICD-10-CM

## 2021-09-13 DIAGNOSIS — I10 Essential (primary) hypertension: Secondary | ICD-10-CM | POA: Diagnosis not present

## 2021-09-13 DIAGNOSIS — Z7982 Long term (current) use of aspirin: Secondary | ICD-10-CM | POA: Insufficient documentation

## 2021-09-13 DIAGNOSIS — Z452 Encounter for adjustment and management of vascular access device: Secondary | ICD-10-CM | POA: Diagnosis not present

## 2021-09-13 DIAGNOSIS — R1110 Vomiting, unspecified: Secondary | ICD-10-CM | POA: Diagnosis not present

## 2021-09-13 DIAGNOSIS — R000 Tachycardia, unspecified: Secondary | ICD-10-CM | POA: Diagnosis not present

## 2021-09-13 DIAGNOSIS — Z789 Other specified health status: Secondary | ICD-10-CM

## 2021-09-13 LAB — CBC WITH DIFFERENTIAL/PLATELET
Abs Immature Granulocytes: 0.06 10*3/uL (ref 0.00–0.07)
Basophils Absolute: 0 10*3/uL (ref 0.0–0.1)
Basophils Relative: 0 %
Eosinophils Absolute: 0 10*3/uL (ref 0.0–0.5)
Eosinophils Relative: 0 %
HCT: 40.3 % (ref 36.0–46.0)
Hemoglobin: 12.3 g/dL (ref 12.0–15.0)
Immature Granulocytes: 0 %
Lymphocytes Relative: 12 %
Lymphs Abs: 1.7 10*3/uL (ref 0.7–4.0)
MCH: 24.8 pg — ABNORMAL LOW (ref 26.0–34.0)
MCHC: 30.5 g/dL (ref 30.0–36.0)
MCV: 81.4 fL (ref 80.0–100.0)
Monocytes Absolute: 0.7 10*3/uL (ref 0.1–1.0)
Monocytes Relative: 5 %
Neutro Abs: 11.4 10*3/uL — ABNORMAL HIGH (ref 1.7–7.7)
Neutrophils Relative %: 83 %
Platelets: 316 10*3/uL (ref 150–400)
RBC: 4.95 MIL/uL (ref 3.87–5.11)
RDW: 23.3 % — ABNORMAL HIGH (ref 11.5–15.5)
WBC: 13.9 10*3/uL — ABNORMAL HIGH (ref 4.0–10.5)
nRBC: 0 % (ref 0.0–0.2)

## 2021-09-13 LAB — COMPREHENSIVE METABOLIC PANEL
ALT: 14 U/L (ref 0–44)
AST: 21 U/L (ref 15–41)
Albumin: 3.9 g/dL (ref 3.5–5.0)
Alkaline Phosphatase: 54 U/L (ref 38–126)
Anion gap: 14 (ref 5–15)
BUN: 11 mg/dL (ref 8–23)
CO2: 21 mmol/L — ABNORMAL LOW (ref 22–32)
Calcium: 9.5 mg/dL (ref 8.9–10.3)
Chloride: 105 mmol/L (ref 98–111)
Creatinine, Ser: 0.58 mg/dL (ref 0.44–1.00)
GFR, Estimated: >60 mL/min (ref >60)
Glucose, Bld: 211 mg/dL — ABNORMAL HIGH (ref 70–99)
Potassium: 3.7 mmol/L (ref 3.5–5.1)
Sodium: 140 mmol/L (ref 135–145)
Total Bilirubin: 0.6 mg/dL (ref 0.3–1.2)
Total Protein: 7.9 g/dL (ref 6.5–8.1)

## 2021-09-13 LAB — LIPASE, BLOOD: Lipase: 36 U/L (ref 11–51)

## 2021-09-13 LAB — CBG MONITORING, ED: Glucose-Capillary: 226 mg/dL — ABNORMAL HIGH (ref 70–99)

## 2021-09-13 MED ORDER — ONDANSETRON 4 MG PO TBDP: 4.0000 mg | ORAL_TABLET | Freq: Once | ORAL | Status: DC

## 2021-09-13 NOTE — ED Triage Notes (Signed)
Pt c/o abd pain and vomiting that started earlier today.

## 2021-09-13 NOTE — ED Provider Notes (Signed)
Emergency Medicine Provider Triage Evaluation Note  Diane [name] , a 75 y.o. female  was evaluated in triage.  Pt complains of gradual onset, constant, diffuse, sharp, abdominal pain that began today.  Patient also complains of nausea and vomiting.  She last had a bowel movement earlier today, normal for her.  She denies any fevers.  Denies any recent sick contacts. PSHx cholecystectomy, hernia repair, hysterectomy.  Review of Systems  Positive: + abd pain, nausea, vomiting Negative: - diarrhea, constipation, fever  Physical Exam  BP (!) 188/90 (BP Location: Left Arm)   Pulse 89   Temp 98.7 F (37.1 C) (Oral)   Resp 19   Ht 5\' 8"  (1.727 m)   Wt 92.1 kg   SpO2 100%   BMI 30.87 kg/m  Gen:   Awake, no distress   Resp:  Normal effort  MSK:   Moves extremities without difficulty  Other:  Diffuse abd pain  Medical Decision Making  Medically screening exam initiated at 9:58 PM.  Appropriate orders placed.  [name] was informed that the remainder of the evaluation will be completed by another provider, this initial triage assessment does not replace that evaluation, and the importance of remaining in the ED until their evaluation is complete.     Eustaquio Maize, PA-C 09/13/21 2200    Sherwood Gambler, MD 09/13/21 2214

## 2021-09-14 ENCOUNTER — Emergency Department (HOSPITAL_COMMUNITY): Payer: Medicare Other

## 2021-09-14 ENCOUNTER — Encounter (HOSPITAL_COMMUNITY): Payer: Self-pay | Admitting: General Surgery

## 2021-09-14 DIAGNOSIS — R1110 Vomiting, unspecified: Secondary | ICD-10-CM | POA: Diagnosis not present

## 2021-09-14 DIAGNOSIS — R109 Unspecified abdominal pain: Secondary | ICD-10-CM | POA: Diagnosis not present

## 2021-09-14 LAB — RESP PANEL BY RT-PCR (FLU A&B, COVID) ARPGX2
Influenza A by PCR: NEGATIVE
Influenza B by PCR: NEGATIVE
SARS Coronavirus 2 by RT PCR: NEGATIVE

## 2021-09-14 IMAGING — CT CT ABD-PELV W/ CM
2 of 5 series · 16 of 46 positions shown, 18 images · IV contrast (Omni 300)
Comparison: CT abdomen and pelvis dated July 12, 2020

CLINICAL DATA: Abdominal pain vomiting, prior abdominal surgery

EXAM:
CT ABDOMEN AND PELVIS WITH CONTRAST
TECHNIQUE: Multidetector CT imaging of the abdomen and pelvis was performed
using the standard protocol following bolus administration of
intravenous contrast.
CONTRAST:  100mL OMNIPAQUE IOHEXOL 300 MG/ML  SOLN

[Series 3: a/p w/ 5mm · axial · 0.86mm/px · z∈[+975,+1455]mm · 13 of 108 slices shown, 15 images]
[im 6/108  soft-tissue]
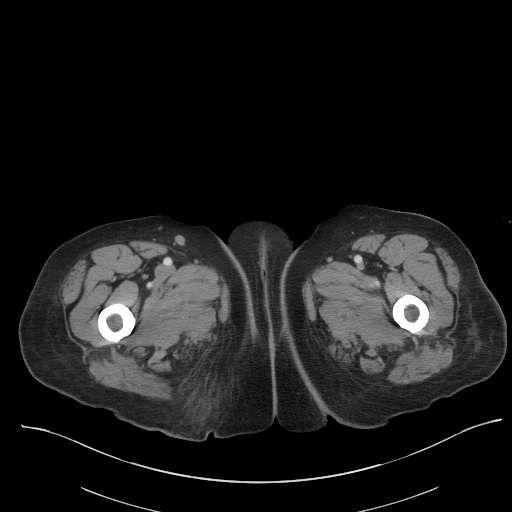
[im 6/108  bone]
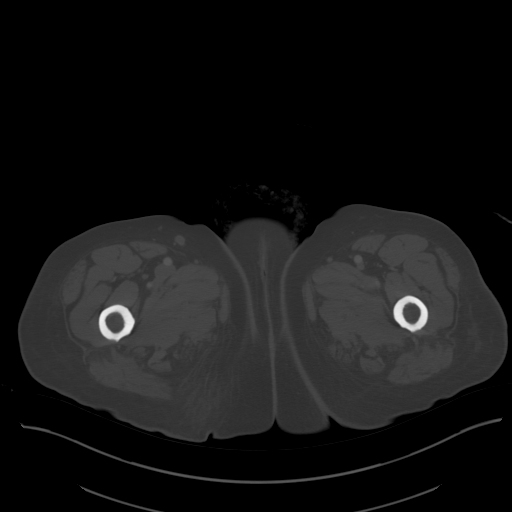
[im 16/108  soft-tissue]
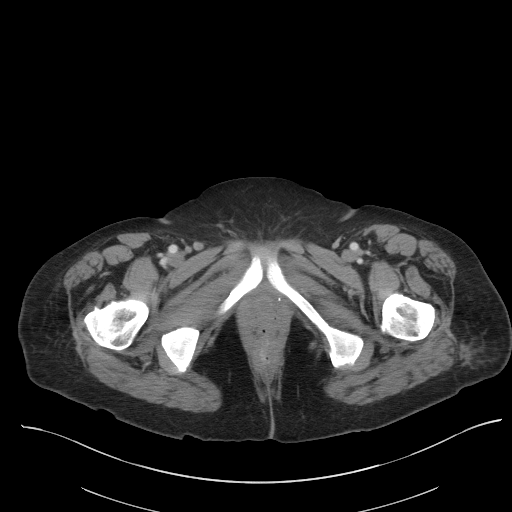
[im 21/108  soft-tissue]
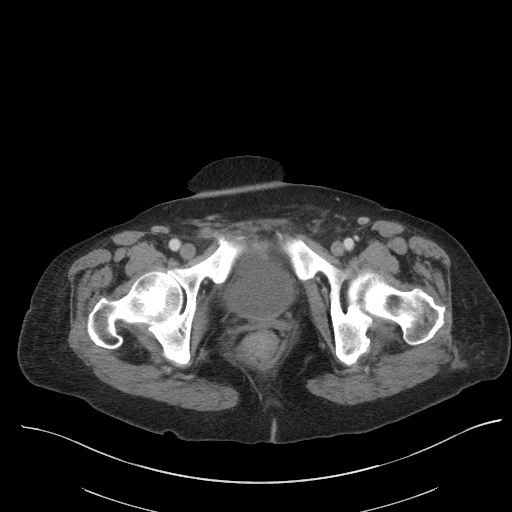
[im 31/108  soft-tissue]
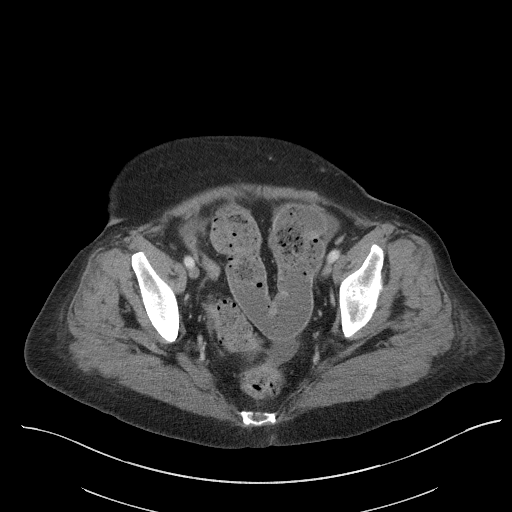
[im 36/108  soft-tissue]
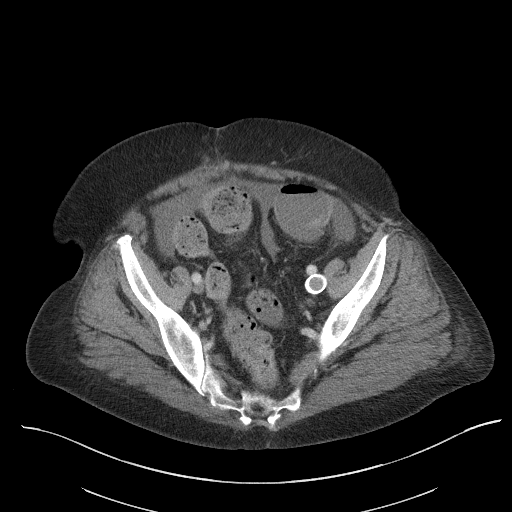
[im 46/108  soft-tissue]
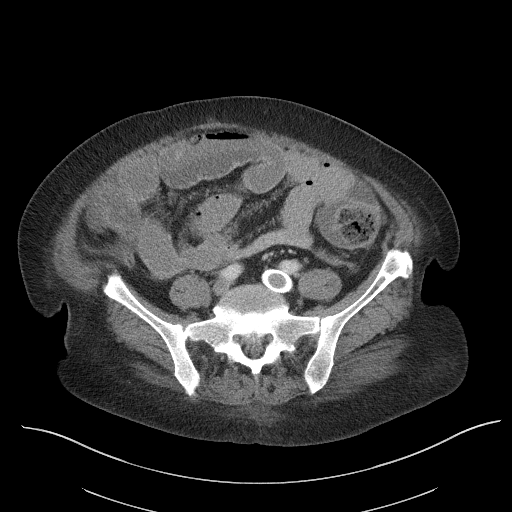
[im 57/108  soft-tissue]
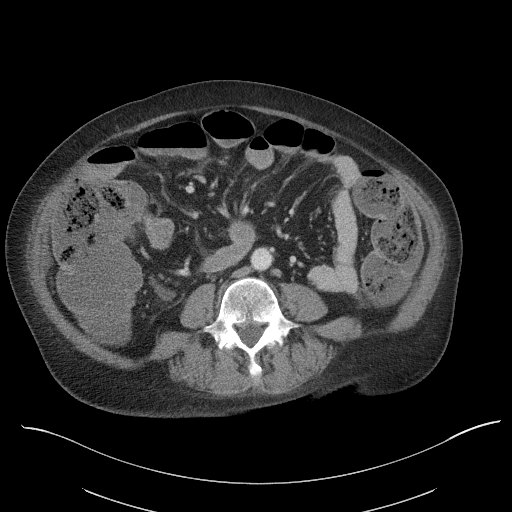
[im 62/108  soft-tissue]
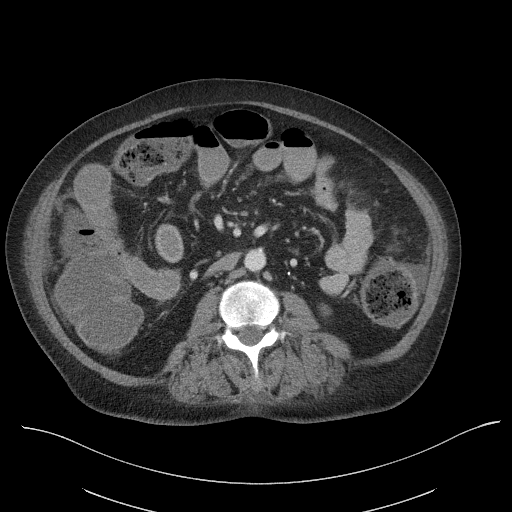
[im 72/108  soft-tissue]
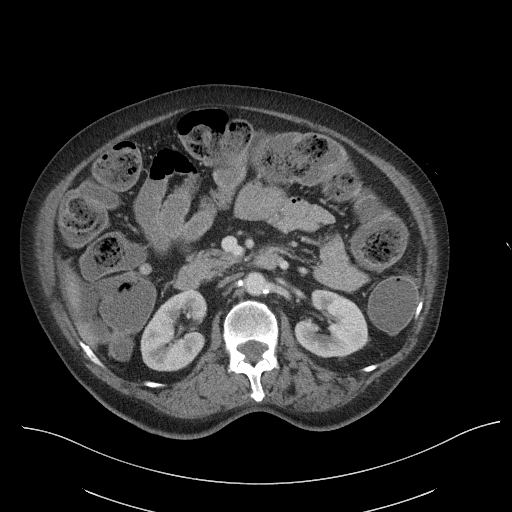
[im 72/108  bone]
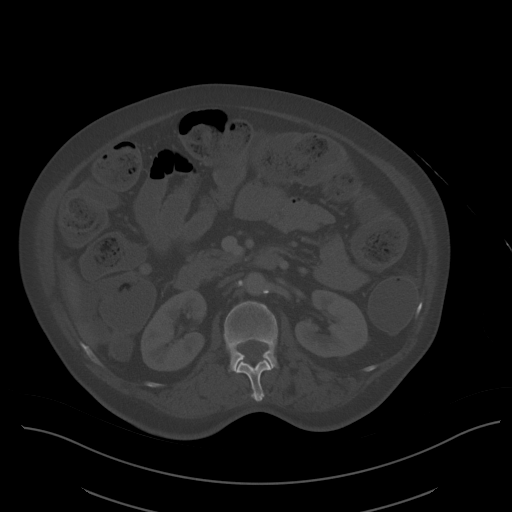
[im 77/108  soft-tissue]
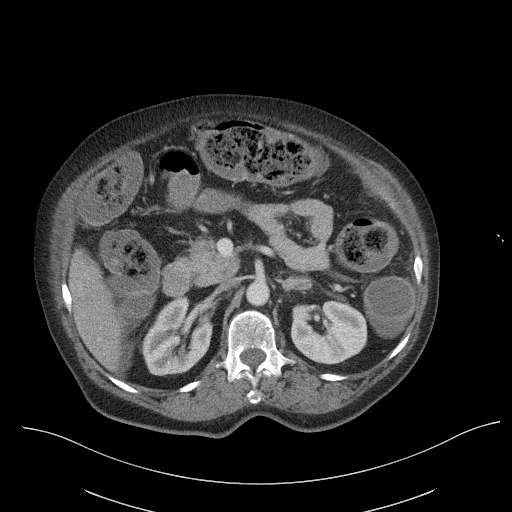
[im 87/108  soft-tissue]
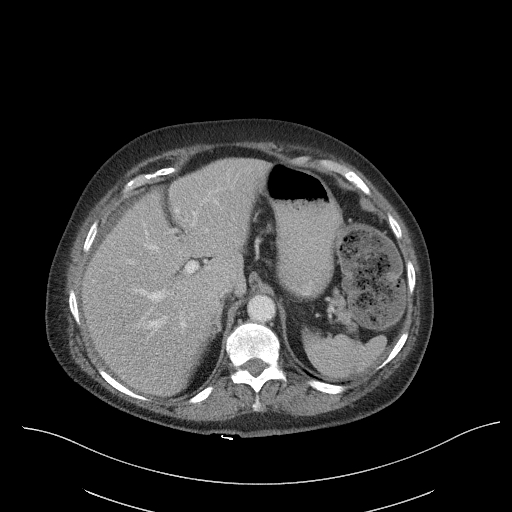
[im 92/108  soft-tissue]
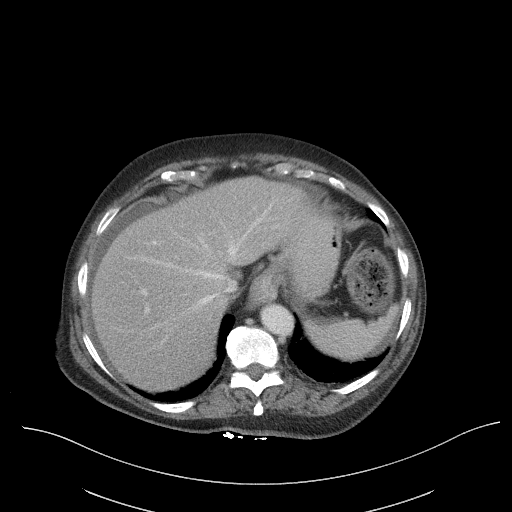
[im 102/108  soft-tissue]
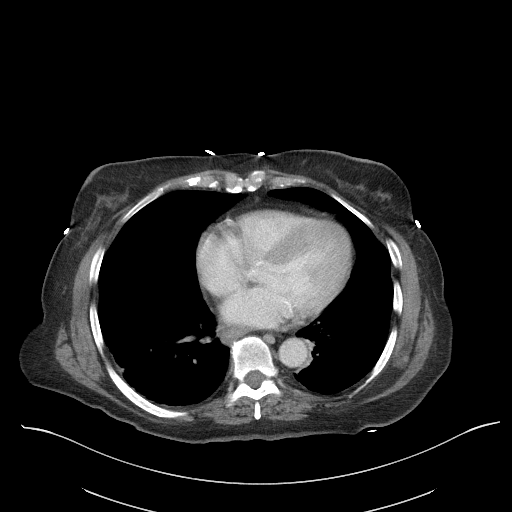

[Series 7: a/p w/ cor · coronal · 0.83mm/px · 3 of 165 slices shown]
[im 55/165  soft-tissue]
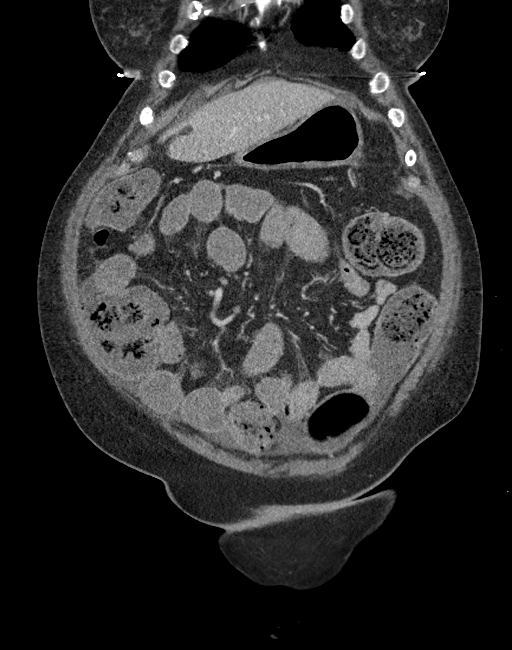
[im 73/165  soft-tissue]
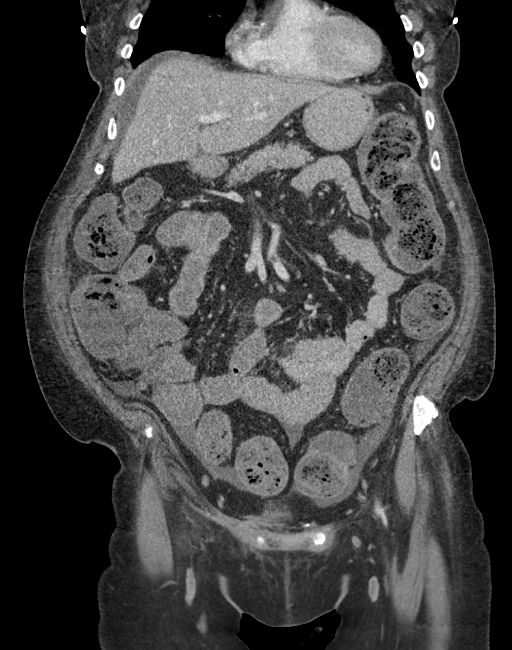
[im 92/165  soft-tissue]
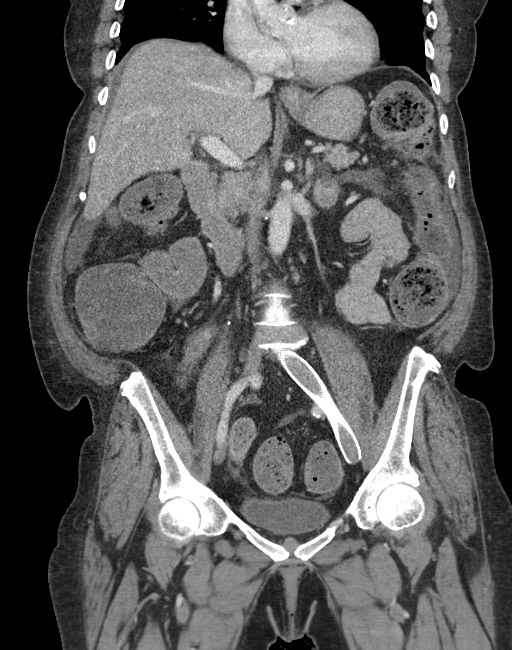

[16 of 46 positions shown; findings below may reference images not displayed]

FINDINGS: Lower chest: Bibasilar atelectasis.

Hepatobiliary: No focal liver abnormality is seen. Status post
cholecystectomy. No biliary dilatation.

Pancreas: Unremarkable. No pancreatic ductal dilatation or
surrounding inflammatory changes.

Spleen: Normal in size without focal abnormality.

Adrenals/Urinary Tract: Thickening of one of the limbs of the left
adrenal gland, unchanged when compared with priors dating back to
7339. No hydronephrosis or nephrolithiasis. Bladder is unremarkable.

Stomach/Bowel: Stomach is unremarkable. Mildly dilated loops of
small and large bowel with no transition point. No wall thickening
or inflammatory change.

Vascular/Lymphatic: Aortic atherosclerosis. No enlarged abdominal or
pelvic lymph nodes. Stent of the left common and external iliac
vein.

Reproductive: Status post hysterectomy. No adnexal masses.

Other: No abdominal wall hernia or abnormality. Small volume
abdominal ascites.

Musculoskeletal: No acute or significant osseous findings.
IMPRESSION: 1. Mildly dilated loops of distal small bowel and mild diffuse
dilation of the large bowel with no transition point, findings are
likely due to ileus versus or early or partial small bowel
obstruction.
2. Small volume abdominal ascites.
3. Aortic Atherosclerosis ([id number]).

## 2021-09-14 MED ORDER — IOHEXOL 300 MG/ML  SOLN
100.0000 mL | Freq: Once | INTRAMUSCULAR | Status: AC | PRN
  Administered 2021-09-14: 100 mL via INTRAVENOUS

## 2021-09-14 MED ORDER — ONDANSETRON 4 MG PO TBDP
4.0000 mg | ORAL_TABLET | Freq: Once | ORAL | Status: AC
  Administered 2021-09-14: 4 mg via ORAL
  Filled 2021-09-14: qty 1

## 2021-09-14 MED ORDER — SODIUM CHLORIDE 0.9 % IV BOLUS
500.0000 mL | Freq: Once | INTRAVENOUS | Status: AC
  Administered 2021-09-14: 500 mL via INTRAVENOUS

## 2021-09-14 MED ORDER — ONDANSETRON HCL 4 MG/2ML IJ SOLN
4.0000 mg | Freq: Once | INTRAMUSCULAR | Status: AC
  Administered 2021-09-14: 4 mg via INTRAVENOUS
  Filled 2021-09-14: qty 2

## 2021-09-14 MED ORDER — IOHEXOL 9 MG/ML PO SOLN
ORAL | Status: AC
  Filled 2021-09-14: qty 1000

## 2021-09-14 MED ORDER — FENTANYL CITRATE PF 50 MCG/ML IJ SOSY
50.0000 ug | PREFILLED_SYRINGE | Freq: Once | INTRAMUSCULAR | Status: AC
Start: 2021-09-14 — End: 2021-09-14
  Administered 2021-09-14: 50 ug via INTRAVENOUS
  Filled 2021-09-14: qty 1

## 2021-09-14 NOTE — ED Provider Notes (Signed)
[facility] Provider Note   CSN: [id number] Arrival date & time: 09/13/21  2112    History Chief Complaint  Patient presents with   Abdominal Pain   Emesis    Diane [name] is a 75 y.o. female with past medical history significant for prolonged QT interval, hypertension, GERD, diabetes who presents for evaluation of abdominal pain.  Began yesterday.  Described as constant, diffuse, aching.  Associate #2 episodes of NBNB emesis, PTA.  Bowel movement 2 days ago. No melena or BRBPR. No fever, chills, CP, SOB, back pain, dysuria, numbness, weakness. Rates pain a 7/10. Denies additional aggravating or alleviating factors.  Only taking 1/2 cap full Miralax daily. Has been admitted for previous sx previously. Dx with constipation on last admission.  GI- Dr. Collene Mares, States last saw 1-2 month ago  History obtained from patient and past medical records.  No interpreter is used.  HPI     Past Medical History:  Diagnosis Date   Abnormal nuclear stress test 10/18/2013   Arm pain 10/18/2013   Arthritis    Cataract    RIGHT EYE   Chest pain 10/18/2013   Diabetes mellitus    dx over 5 yrs ago   GERD (gastroesophageal reflux disease)    Heart murmur    "yrs ago in [place] -- been here since 1999, "   History of hiatal hernia    Hypertension    Prolonged Q-T interval on ECG 04/20/2017   Pulmonary edema 04/20/2017    Patient Active Problem List   Diagnosis Date Noted   Left leg DVT ([place]) 05/30/2021   Normochromic normocytic anemia 02/12/2021   Constipation    Fever    Acute DVT (deep venous thrombosis) ([facility]) 02/04/2021   AKI (acute kidney injury) ([place]) 01/23/2021   Rheumatoid arthritis ([place]) 01/23/2021   SBO (small bowel obstruction) ([facility]) 01/22/2021   Postoperative anemia due to acute blood loss 06/18/2018   Type 2 diabetes mellitus with diabetic neuropathy, unspecified ([place]) 06/18/2018   Peripheral neuropathy 06/18/2018   Autoimmune skin  disease ([place]) 06/18/2018   Gait difficulty 06/18/2018   Community acquired pneumonia of right lower lobe of lung 06/11/2018   S/P shoulder replacement, right 06/02/2018   Prolonged Q-T interval on ECG 04/20/2017   Pulmonary edema 04/20/2017   Chest pain 10/18/2013   Abnormal nuclear stress test 10/18/2013   Arm pain 10/18/2013   Hypertension     Past Surgical History:  Procedure Laterality Date   ABDOMINAL HYSTERECTOMY     BREAST EXCISIONAL BIOPSY     BREAST SURGERY     biopsy for cystic breasts   [place]     with cooking oil yrs ago   CATARACT EXTRACTION W/ INTRAOCULAR LENS IMPLANT Left 2017   CHOLECYSTECTOMY     COLONOSCOPY     DILATION AND CURETTAGE OF UTERUS     ESOPHAGOGASTRODUODENOSCOPY ENDOSCOPY     EYE SURGERY Right    left eye has new lens   HERNIA REPAIR     INSERTION OF MESH N/A 11/24/2017   Procedure: INSERTION OF MESH;  Surgeon: Donnie Mesa, MD;  Location: [facility];  Service: General;  Laterality: N/A;   LEFT HEART CATH AND CORONARY ANGIOGRAPHY N/A 04/21/2017   Procedure: Left Heart Cath and Coronary Angiography;  Surgeon: Lorretta Harp, MD;  Location: [facility];  Service: Cardiovascular;  Laterality: N/A;   PERIPHERAL VASCULAR THROMBECTOMY Left 02/06/2021   Procedure: PERIPHERAL VASCULAR THROMBECTOMY;  Surgeon: Ruta Hinds  E, MD;  Location: [facility];  Service: Cardiovascular;  Laterality: Left;   PERIPHERAL VASCULAR THROMBECTOMY N/A 06/01/2021   Procedure: PERIPHERAL VASCULAR THROMBECTOMY;  Surgeon: Waynetta Sandy, MD;  Location: [facility];  Service: Cardiovascular;  Laterality: N/A;   REVERSE SHOULDER ARTHROPLASTY Right 06/02/2018   REVERSE SHOULDER ARTHROPLASTY Right 06/02/2018   Procedure: REVERSE SHOULDER ARTHROPLASTY;  Surgeon: Netta Cedars, MD;  Location: [facility];  Service: Orthopedics;  Laterality: Right;   SHOULDER ARTHROSCOPY WITH SUBACROMIAL DECOMPRESSION AND OPEN ROTATOR C Right 03/11/2017   Procedure: RIGHT SHOULDER  ARTHROSCOPY, subacromial decompression, MINI-OPEN rotator cuff repair, OPEN distal clavicle resection;  Surgeon: Netta Cedars, MD;  Location: [facility];  Service: Orthopedics;  Laterality: Right;   UMBILICAL HERNIA REPAIR N/A 11/24/2017   Procedure: [place];  Surgeon: Donnie Mesa, MD;  Location: [facility];  Service: General;  Laterality: N/A;     OB History   No obstetric history on file.     Family History  Family history unknown: Yes    Social History   Tobacco Use   Smoking status: Never   Smokeless tobacco: Never  Vaping Use   Vaping Use: Never used  Substance Use Topics   Alcohol use: Never   Drug use: Never    Home Medications Prior to Admission medications   Medication Sig Start Date End Date Taking? Authorizing Provider  amLODipine (NORVASC) 5 MG tablet Take 5 mg by mouth daily.   Yes [provider]  apixaban (ELIQUIS) 5 MG TABS tablet Take 5 mg by mouth 2 (two) times daily.   Yes [provider]  aspirin EC 81 MG tablet Take 81 mg by mouth daily. Swallow whole.   Yes [provider]  docusate sodium (COLACE) 100 MG capsule Take 1 capsule (100 mg total) by mouth 2 (two) times daily. 05/27/21  Yes Lavina Hamman, MD  DULoxetine (CYMBALTA) 20 MG capsule Take 20 mg by mouth every morning. 04/30/21  Yes [provider]  folic acid (FOLVITE) 1 MG tablet Take 1 tablet (1 mg total) by mouth daily. 02/26/21  Yes Medina-Vargas, Monina C, NP  furosemide (LASIX) 20 MG tablet Take 20 mg by mouth daily. 09/10/21  Yes [provider]  gabapentin (NEURONTIN) 300 MG capsule Take 300 mg by mouth at bedtime.   Yes [provider]  losartan (COZAAR) 100 MG tablet Take 100 mg by mouth daily. 07/07/21  Yes [provider]  metFORMIN (GLUCOPHAGE-XR) 500 MG 24 hr tablet Take 1,000 mg by mouth daily with breakfast.   Yes [provider]  methotrexate (RHEUMATREX) 2.5 MG tablet Take 8 tablets (20 mg total) by mouth  once a week. 8 tablets weekly (Sunday) Patient taking differently: Take 20 mg by mouth every Sunday. 8 tabs 02/26/21  Yes Medina-Vargas, Monina C, NP  naproxen sodium (ALEVE) 220 MG tablet Take 440 mg by mouth daily as needed (pain).   Yes [provider]  oxybutynin (DITROPAN-XL) 5 MG 24 hr tablet Take 5 mg by mouth daily. 08/12/21  Yes [provider]  pantoprazole (PROTONIX) 40 MG tablet Take 1 tablet (40 mg total) by mouth daily. 07/03/21  Yes Patrecia Pour, MD  polyethylene glycol (MIRALAX / GLYCOLAX) 17 g packet Take 17 g by mouth 2 (two) times daily. Patient taking differently: Take 17 g by mouth daily as needed for mild constipation. 05/27/21  Yes Lavina Hamman, MD  rosuvastatin (CRESTOR) 10 MG tablet Take 1 tablet (10 mg total)  by mouth daily. 02/26/21  Yes Medina-Vargas, Monina C, NP  simethicone (MYLICON) 80 MG chewable tablet Chew 1 tablet (80 mg total) by mouth 4 (four) times daily. 05/27/21  Yes Lavina Hamman, MD    Allergies    Cephalexin, Tapentadol, Codeine, and Nucynta [tapentadol hcl]  Review of Systems   Review of Systems  Constitutional: Negative.   HENT: Negative.    Respiratory: Negative.    Cardiovascular: Negative.   Gastrointestinal:  Positive for abdominal pain, constipation, nausea and vomiting. Negative for abdominal distention, anal bleeding, blood in stool, diarrhea and rectal pain.  Genitourinary: Negative.   Musculoskeletal: Negative.   Skin: Negative.   Neurological: Negative.   All other systems reviewed and are negative.  Physical Exam Updated Vital Signs BP (!) 141/80   Pulse 99   Temp 98.1 F (36.7 C) (Oral)   Resp 19   Ht 5\' 8"  (1.727 m)   Wt 92.1 kg   SpO2 96%   BMI 30.87 kg/m   Physical Exam Vitals and nursing note reviewed.  Constitutional:      General: She is not in acute distress.    Appearance: She is well-developed. She is not ill-appearing.  HENT:     Head: Atraumatic.     Mouth/Throat:     Mouth: Mucous  membranes are moist.  Eyes:     Pupils: Pupils are equal, round, and reactive to light.  Neck:     Trachea: Trachea and phonation normal.     Comments: Full range of motion Cardiovascular:     Rate and Rhythm: Normal rate.     Pulses: Normal pulses.          Radial pulses are 2+ on the right side and 2+ on the left side.       Dorsalis pedis pulses are 2+ on the right side and 2+ on the left side.     Heart sounds: Normal heart sounds.  Pulmonary:     Effort: Pulmonary effort is normal. No respiratory distress.     Breath sounds: Normal breath sounds and air entry.     Comments: Clear bilaterally, speaks in full sentences without difficulty Abdominal:     General: There is no distension.     Palpations: Abdomen is soft.     Tenderness: There is generalized abdominal tenderness. There is no right CVA tenderness, left CVA tenderness, guarding or rebound. Negative signs include Murphy's sign and McBurney's sign.     Hernia: No hernia is present.  Musculoskeletal:        General: Normal range of motion.     Cervical back: Full passive range of motion without pain and normal range of motion.     Comments: No bony tenderness.  Moves all 4 extremities without difficulty  Feet:     Comments: No lower extremity edema, erythema or warmth Skin:    General: Skin is warm and dry.     Capillary Refill: Capillary refill takes less than 2 seconds.     Comments: No obvious rash or lesion  Neurological:     General: No focal deficit present.     Mental Status: She is alert.     Cranial Nerves: Cranial nerves 2-12 are intact.     Sensory: Sensation is intact.     Motor: Motor function is intact.     Gait: Gait is intact.     Comments: Ambulatory CN 2-12 grossly intact  Psychiatric:        Mood and  Affect: Mood normal.   ED Results / Procedures / Treatments   Labs (all labs ordered are listed, but only abnormal results are displayed) Labs Reviewed  COMPREHENSIVE METABOLIC PANEL - Abnormal;  Notable for the following components:      Result Value   CO2 21 (*)    Glucose, Bld 211 (*)    All other components within normal limits  CBC WITH DIFFERENTIAL/PLATELET - Abnormal; Notable for the following components:   WBC 13.9 (*)    MCH 24.8 (*)    RDW 23.3 (*)    Neutro Abs 11.4 (*)    All other components within normal limits  CBG MONITORING, ED - Abnormal; Notable for the following components:   Glucose-Capillary 226 (*)    All other components within normal limits  RESP PANEL BY RT-PCR (FLU A&B, COVID) ARPGX2  LIPASE, BLOOD  URINALYSIS, ROUTINE W REFLEX MICROSCOPIC    EKG EKG Interpretation  Date/Time:  Monday September 14 2021 10:45:06 EST Ventricular Rate:  106 PR Interval:  114 QRS Duration: 105 QT Interval:  417 QTC Calculation: 554 R Axis:   41 Text Interpretation: Sinus tachycardia Inferior infarct, old Prolonged QT interval Confirmed by Dene Gentry [phone]) on 09/14/2021 11:00:26 AM  Radiology CT ABDOMEN PELVIS W CONTRAST  Result Date: 09/14/2021 CLINICAL DATA:  Abdominal pain vomiting, prior abdominal surgery EXAM: CT ABDOMEN AND PELVIS WITH CONTRAST TECHNIQUE: Multidetector CT imaging of the abdomen and pelvis was performed using the standard protocol following bolus administration of intravenous contrast. CONTRAST:  12mL OMNIPAQUE IOHEXOL 300 MG/ML  SOLN COMPARISON:  CT abdomen and pelvis dated July 12, 2020 FINDINGS: Lower chest: Bibasilar atelectasis. Hepatobiliary: No focal liver abnormality is seen. Status post cholecystectomy. No biliary dilatation. Pancreas: Unremarkable. No pancreatic ductal dilatation or surrounding inflammatory changes. Spleen: Normal in size without focal abnormality. Adrenals/Urinary Tract: Thickening of one of the limbs of the left adrenal gland, unchanged when compared with priors dating back to 2007. No hydronephrosis or nephrolithiasis. Bladder is unremarkable. Stomach/Bowel: Stomach is unremarkable. Mildly dilated loops of  small and large bowel with no transition point. No wall thickening or inflammatory change. Vascular/Lymphatic: Aortic atherosclerosis. No enlarged abdominal or pelvic lymph nodes. Stent of the left common and external iliac vein. Reproductive: Status post hysterectomy. No adnexal masses. Other: No abdominal wall hernia or abnormality. Small volume abdominal ascites. Musculoskeletal: No acute or significant osseous findings. IMPRESSION: 1. Mildly dilated loops of distal small bowel and mild diffuse dilation of the large bowel with no transition point, findings are likely due to ileus versus or early or partial small bowel obstruction. 2. Small volume abdominal ascites. 3. Aortic Atherosclerosis (ICD10-I70.0). Electronically Signed   By: Yetta Glassman M.D.   On: 09/14/2021 09:56   CT US GUIDE VASC ACCESS LT NO REPORT  Result Date: 09/14/2021 There is no Radiologist interpretation  for this exam.   Procedures Procedures   Medications Ordered in ED Medications  ondansetron (ZOFRAN-ODT) disintegrating tablet 4 mg (4 mg Oral Given 09/14/21 0259)  iohexol (OMNIPAQUE) 9 MG/ML oral solution ( Oral Contrast Given 09/14/21 0924)  iohexol (OMNIPAQUE) 300 MG/ML solution 100 mL (100 mLs Intravenous Contrast Given 09/14/21 0924)  sodium chloride 0.9 % bolus 500 mL (0 mLs Intravenous Stopped 09/14/21 1105)  ondansetron (ZOFRAN) injection 4 mg (4 mg Intravenous Given 09/14/21 1028)  fentaNYL (SUBLIMAZE) injection 50 mcg (50 mcg Intravenous Given 09/14/21 1028)   ED Course  I have reviewed the triage vital signs and the nursing notes.  Pertinent labs &  imaging results that were available during my care of the patient were reviewed by me and considered in my medical decision making (see chart for details).  75 year old here for evaluation abdominal pain, constipation.  History of recurrent small bowel obstructions.  She is afebrile, nonseptic, not ill-appearing.  Unfortunately had wait greater than 12 hours  in the waiting room prior to being roomed in the back for my assessment.  Last bowel movement 2 days ago.  Not passing flatus.  Multiple episodes of NBNB emesis.  Feels like this is consistent with her prior bowel obstructions.  No back pain, chest pain, fever, urinary symptoms.  She has diffuse abdominal tenderness on palpation.  Work-up started from triage which I personally reviewed and interpreted:  CBC leukocytosis at 79.0 Metabolic panel glucose 240 Lipase 36 CT abdomen pelvis with ileus versus early partial small bowel obstruction, no transition point seen  Patient requesting IV pain medicine.  I discussed with patient if she is constipated that opiates would worsen this however she is.  Adam[place] that she is in moderate amount of pain.  Will give small dose fentanyl.  Given CT read will consult with general surgery.  Seems like she is admitted for similar symptoms in September which they thought was likely just due to constipation.   CONSULT with Claiborne Billings, PA-C  with Gen surgery.  Reviewed imaging.  They do not feel patient has ileus or small bowel obstruction.  They feel she has constipation.  She has been admitted for this previously.  They feel she likely just needs better bowel regimen at home and close follow-up with GI. Nothing for surgery to do at this time.  Discussed results with patient.  Will get urine, PO and touch base with Dr. Collene Mares with GI.  CONSULT with Dr. Collene Mares with GI who recommends increase Miralax and she can see patient in a few days for FU.  Discussed results with patient.  She is agreeable.  Was able to actually have a bowel movement here in the ED and her pain has resolved.  She is tolerating p.o. intake.  Despite multiple attempts unable to obtain urine here in ED.  She has no symptoms concerning for UTI.  We will hold on any empiric treatment.  Discussed close follow-up with GI, return for new or worsening symptoms.  Patient is agreeable  Patient is nontoxic, nonseptic  appearing, in no apparent distress.  Patient's pain and other symptoms adequately managed in emergency department.  Fluid bolus given.  Labs, imaging and vitals reviewed.  Patient does not meet the SIRS or Sepsis criteria.  On repeat exam patient does not have a surgical abdomin and there are no peritoneal signs.  No indication of appendicitis, bowel obstruction, bowel perforation, cholecystitis, diverticulitis.    The patient has been appropriately medically screened and/or stabilized in the ED. I have low suspicion for any other emergent medical condition which would require further screening, evaluation or treatment in the ED or require inpatient management.  Patient is hemodynamically stable and in no acute distress.  Patient able to ambulate in department prior to ED.  Evaluation does not show acute pathology that would require ongoing or additional emergent interventions while in the emergency department or further inpatient treatment.  I have discussed the diagnosis with the patient and answered all questions.  Pain is been managed while in the emergency department and patient has no further complaints prior to discharge.  Patient is comfortable with plan discussed in room and is stable for  discharge at this time.  I have discussed strict return precautions for returning to the emergency department.  Patient was encouraged to follow-up with PCP/specialist refer to at discharge.    MDM Rules/Calculators/A&P                            Final Clinical Impression(s) / ED Diagnoses Final diagnoses:  Difficult intravenous access  Constipation, unspecified constipation type  Generalized abdominal pain    Rx / DC Orders ED Discharge Orders     None        Marshon Bangs A, PA-C 09/14/21 1330    Valarie Merino, MD 09/15/21 [phone]

## 2021-09-14 NOTE — ED Notes (Signed)
Pt provided discharge instructions and prescription information. Pt was given the opportunity to ask questions and questions were answered. Discharge signature not obtained in the setting of the COVID-19 pandemic in order to reduce high touch surfaces.  ° °

## 2021-09-14 NOTE — ED Notes (Signed)
Called x 1 NO answer

## 2021-09-14 NOTE — Discharge Instructions (Addendum)
Symptoms are likely due to constipation.  I would recommend using 2 capfuls of MiraLAX daily.  Call Dr. Lorie Apley office your gastroenterologist to see her this week.  She is expecting your phone call.  Return for new or worsening symptoms.

## 2021-09-18 DIAGNOSIS — R5383 Other fatigue: Secondary | ICD-10-CM | POA: Diagnosis not present

## 2021-09-18 DIAGNOSIS — I10 Essential (primary) hypertension: Secondary | ICD-10-CM | POA: Diagnosis not present

## 2021-09-18 DIAGNOSIS — E1165 Type 2 diabetes mellitus with hyperglycemia: Secondary | ICD-10-CM | POA: Diagnosis not present

## 2021-09-19 ENCOUNTER — Encounter (HOSPITAL_COMMUNITY): Payer: Self-pay

## 2021-09-19 ENCOUNTER — Emergency Department (HOSPITAL_COMMUNITY): Payer: Medicare Other

## 2021-09-19 ENCOUNTER — Emergency Department (HOSPITAL_COMMUNITY)
Admission: EM | Admit: 2021-09-19 | Discharge: 2021-09-19 | Disposition: A | Discharge status: Home / Self Care | Payer: Medicare Other | Attending: Emergency Medicine | Admitting: Emergency Medicine

## 2021-09-19 ENCOUNTER — Other Ambulatory Visit: Payer: Self-pay

## 2021-09-19 DIAGNOSIS — Z7901 Long term (current) use of anticoagulants: Secondary | ICD-10-CM | POA: Diagnosis not present

## 2021-09-19 DIAGNOSIS — M542 Cervicalgia: Secondary | ICD-10-CM | POA: Diagnosis not present

## 2021-09-19 DIAGNOSIS — R531 Weakness: Secondary | ICD-10-CM | POA: Diagnosis not present

## 2021-09-19 DIAGNOSIS — M79601 Pain in right arm: Secondary | ICD-10-CM | POA: Diagnosis not present

## 2021-09-19 DIAGNOSIS — Z96611 Presence of right artificial shoulder joint: Secondary | ICD-10-CM | POA: Diagnosis not present

## 2021-09-19 DIAGNOSIS — M25511 Pain in right shoulder: Secondary | ICD-10-CM | POA: Diagnosis not present

## 2021-09-19 DIAGNOSIS — Z79899 Other long term (current) drug therapy: Secondary | ICD-10-CM | POA: Diagnosis not present

## 2021-09-19 DIAGNOSIS — Z7982 Long term (current) use of aspirin: Secondary | ICD-10-CM | POA: Diagnosis not present

## 2021-09-19 DIAGNOSIS — E1140 Type 2 diabetes mellitus with diabetic neuropathy, unspecified: Secondary | ICD-10-CM | POA: Diagnosis not present

## 2021-09-19 DIAGNOSIS — R6889 Other general symptoms and signs: Secondary | ICD-10-CM | POA: Diagnosis not present

## 2021-09-19 DIAGNOSIS — Z7984 Long term (current) use of oral hypoglycemic drugs: Secondary | ICD-10-CM | POA: Insufficient documentation

## 2021-09-19 DIAGNOSIS — M79603 Pain in arm, unspecified: Secondary | ICD-10-CM | POA: Diagnosis not present

## 2021-09-19 DIAGNOSIS — I10 Essential (primary) hypertension: Secondary | ICD-10-CM | POA: Insufficient documentation

## 2021-09-19 DIAGNOSIS — Z955 Presence of coronary angioplasty implant and graft: Secondary | ICD-10-CM | POA: Diagnosis not present

## 2021-09-19 DIAGNOSIS — R52 Pain, unspecified: Secondary | ICD-10-CM

## 2021-09-19 DIAGNOSIS — Z743 Need for continuous supervision: Secondary | ICD-10-CM | POA: Diagnosis not present

## 2021-09-19 LAB — CBC WITH DIFFERENTIAL/PLATELET
Abs Immature Granulocytes: 0.03 10*3/uL (ref 0.00–0.07)
Basophils Absolute: 0 10*3/uL (ref 0.0–0.1)
Basophils Relative: 0 %
Eosinophils Absolute: 0.1 10*3/uL (ref 0.0–0.5)
Eosinophils Relative: 1 %
HCT: 35.4 % — ABNORMAL LOW (ref 36.0–46.0)
Hemoglobin: 10.8 g/dL — ABNORMAL LOW (ref 12.0–15.0)
Immature Granulocytes: 0 %
Lymphocytes Relative: 23 %
Lymphs Abs: 1.7 10*3/uL (ref 0.7–4.0)
MCH: 24.9 pg — ABNORMAL LOW (ref 26.0–34.0)
MCHC: 30.5 g/dL (ref 30.0–36.0)
MCV: 81.6 fL (ref 80.0–100.0)
Monocytes Absolute: 0.8 10*3/uL (ref 0.1–1.0)
Monocytes Relative: 11 %
Neutro Abs: 5 10*3/uL (ref 1.7–7.7)
Neutrophils Relative %: 65 %
Platelets: 272 10*3/uL (ref 150–400)
RBC: 4.34 MIL/uL (ref 3.87–5.11)
RDW: 23.2 % — ABNORMAL HIGH (ref 11.5–15.5)
WBC: 7.6 10*3/uL (ref 4.0–10.5)
nRBC: 0 % (ref 0.0–0.2)

## 2021-09-19 LAB — COMPREHENSIVE METABOLIC PANEL
ALT: 18 U/L (ref 0–44)
AST: 20 U/L (ref 15–41)
Albumin: 3.5 g/dL (ref 3.5–5.0)
Alkaline Phosphatase: 45 U/L (ref 38–126)
Anion gap: 9 (ref 5–15)
BUN: 7 mg/dL — ABNORMAL LOW (ref 8–23)
CO2: 30 mmol/L (ref 22–32)
Calcium: 9.3 mg/dL (ref 8.9–10.3)
Chloride: 100 mmol/L (ref 98–111)
Creatinine, Ser: 0.58 mg/dL (ref 0.44–1.00)
GFR, Estimated: >60 mL/min (ref >60)
Glucose, Bld: 129 mg/dL — ABNORMAL HIGH (ref 70–99)
Potassium: 3.9 mmol/L (ref 3.5–5.1)
Sodium: 139 mmol/L (ref 135–145)
Total Bilirubin: 0.5 mg/dL (ref 0.3–1.2)
Total Protein: 6.8 g/dL (ref 6.5–8.1)

## 2021-09-19 LAB — TROPONIN I (HIGH SENSITIVITY)
Troponin I (High Sensitivity): 6 ng/L (ref <18)
Troponin I (High Sensitivity): 6 ng/L (ref <18)

## 2021-09-19 IMAGING — CR DG SHOULDER 2+V*R*
3 series · 3 of 3 positions shown · non-contrast
Comparison: 06/02/2018

CLINICAL DATA: Right shoulder pain, no known injury, initial
encounter

EXAM:
RIGHT SHOULDER - 2+ VIEW

[shoulder grashey]
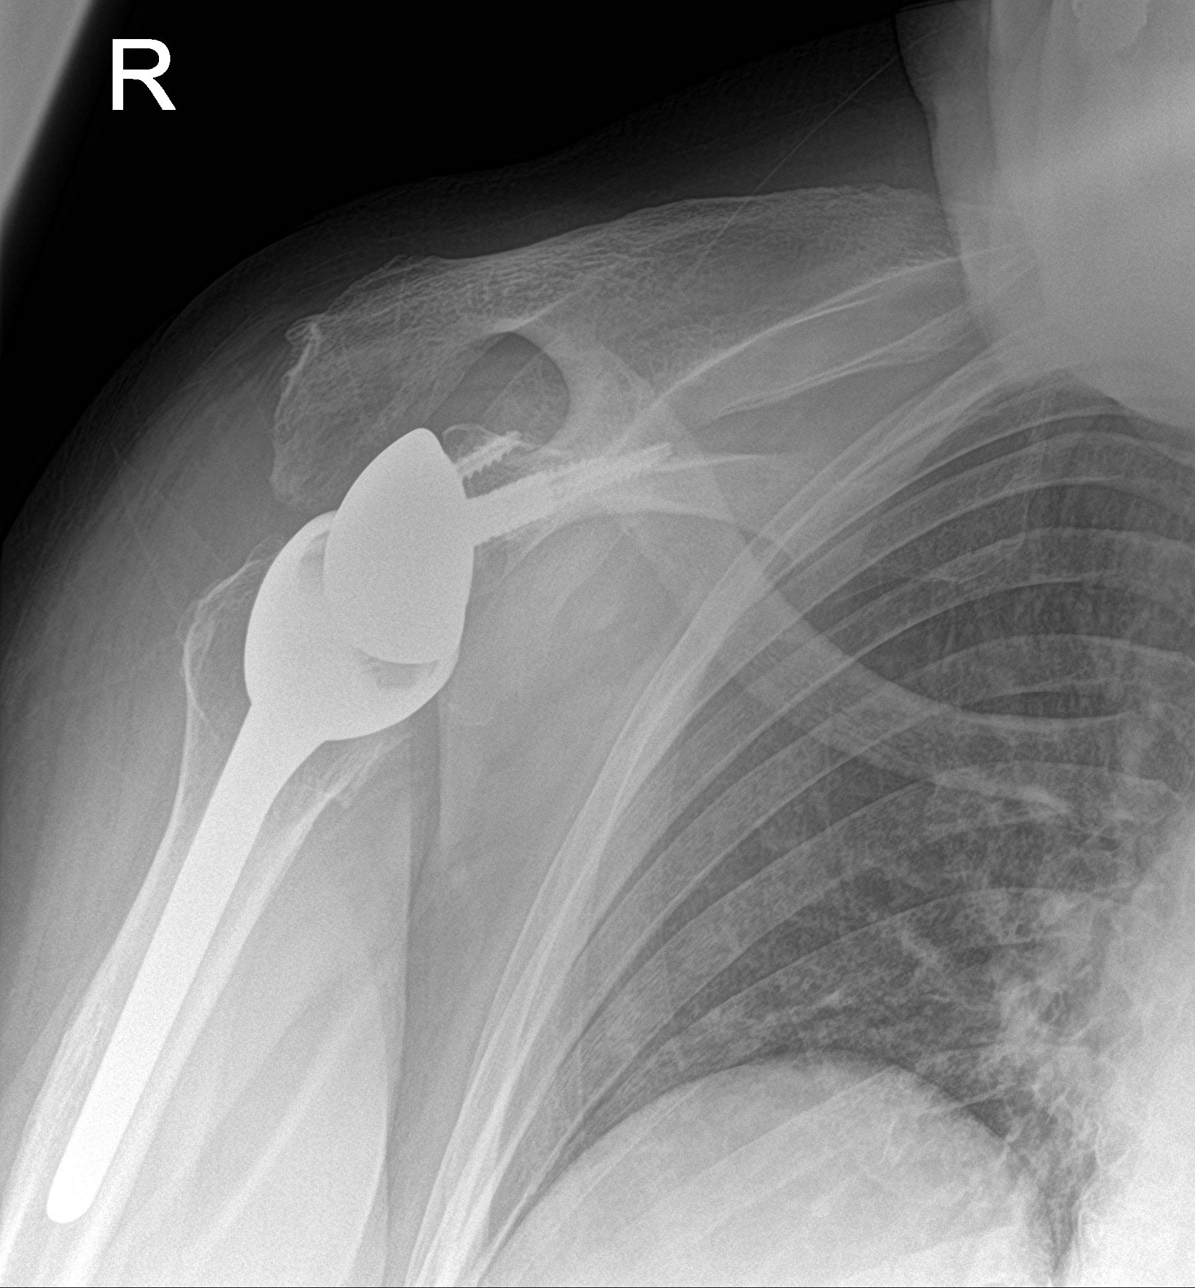

[shoulder y view]
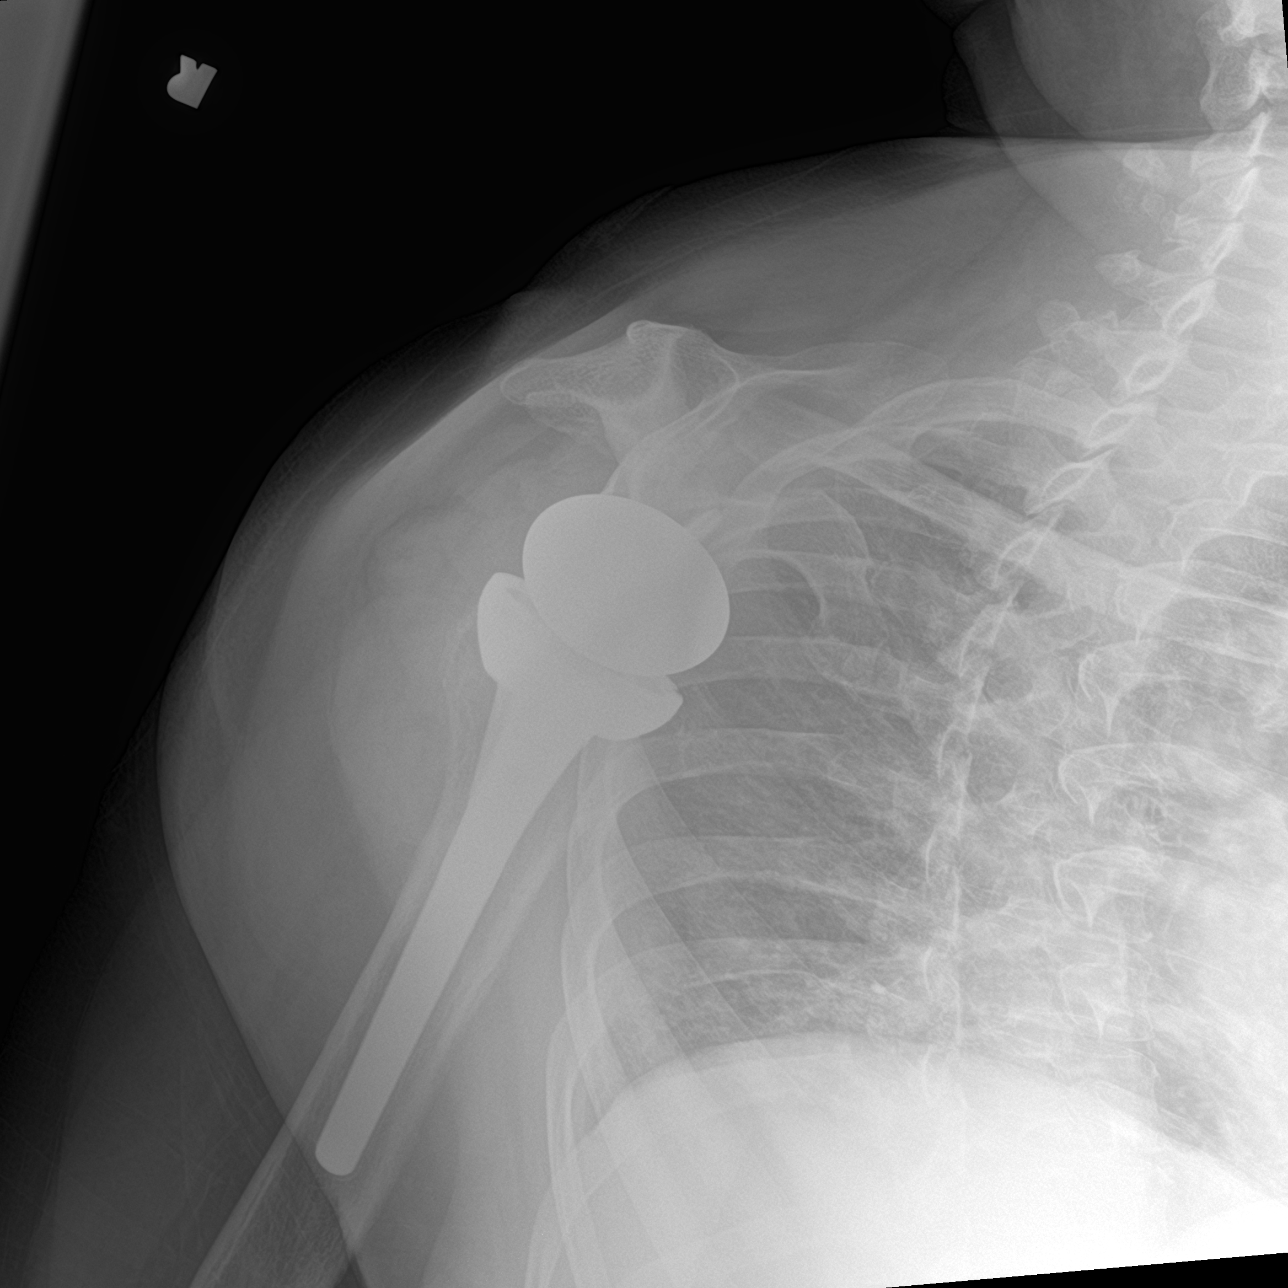

[shoulder ap neutral]
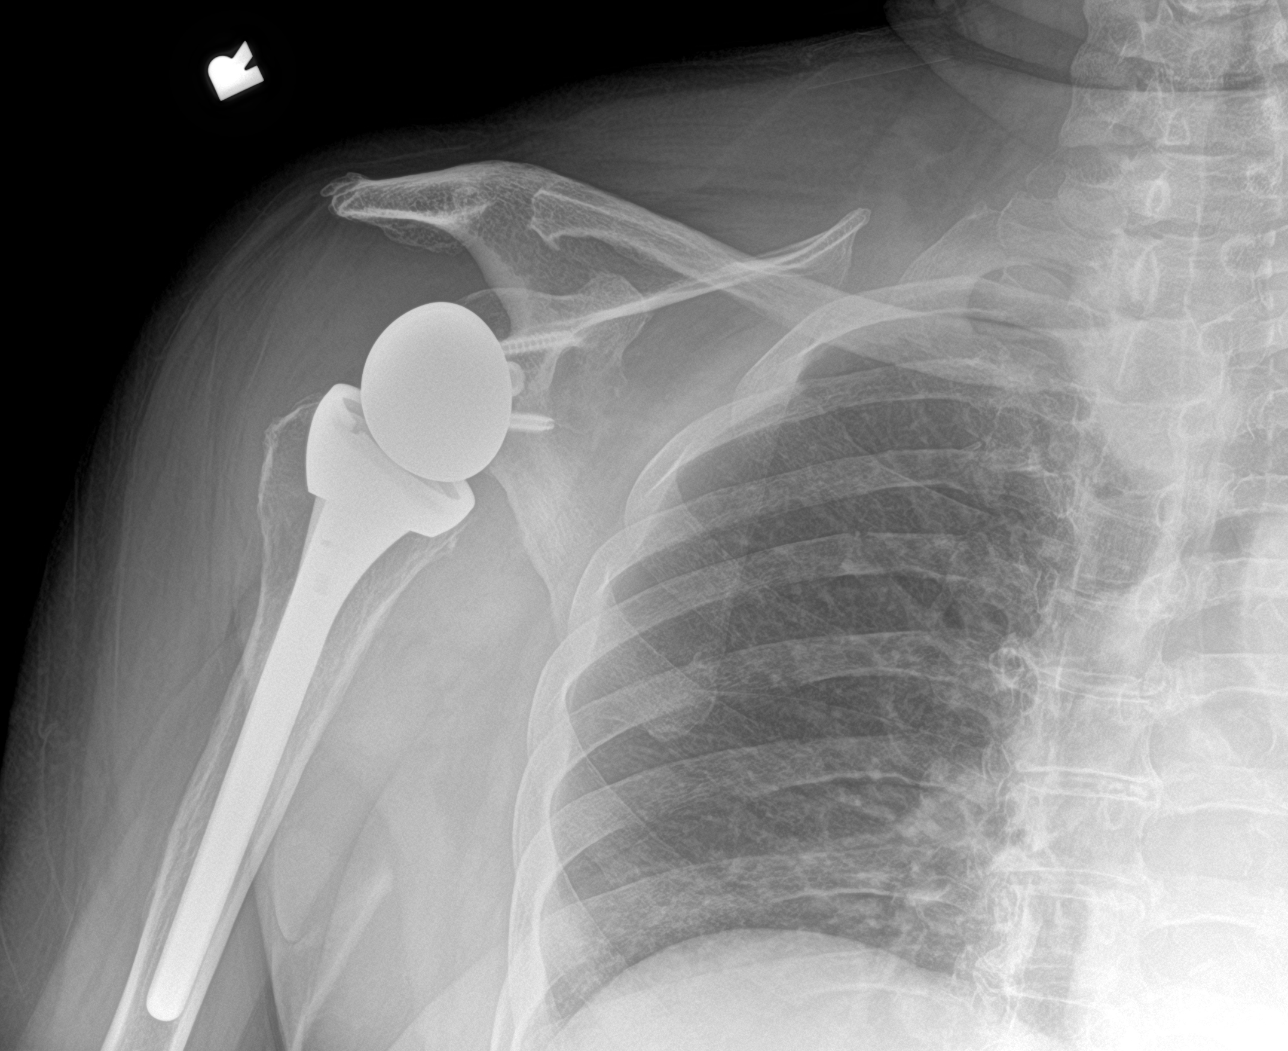

[3 of 3 positions shown; findings below may reference images not displayed]

FINDINGS: Right shoulder replacement is seen in satisfactory position. No
acute fracture or dislocation is noted. The underlying bony thorax
appears within normal limits. The acromioclavicular joint is widened
stable in appearance from the prior exam likely related to prior
distal clavicular resection
IMPRESSION: Chronic postoperative changes in the right shoulder. No acute
abnormality noted.

## 2021-09-19 IMAGING — CR DG CHEST 1V
1 series · 1 of 1 positions shown · non-contrast
Comparison: 07/12/2021

CLINICAL DATA: Right shoulder pain, initial encounter

EXAM:
CHEST  1 VIEW

[chest ap]
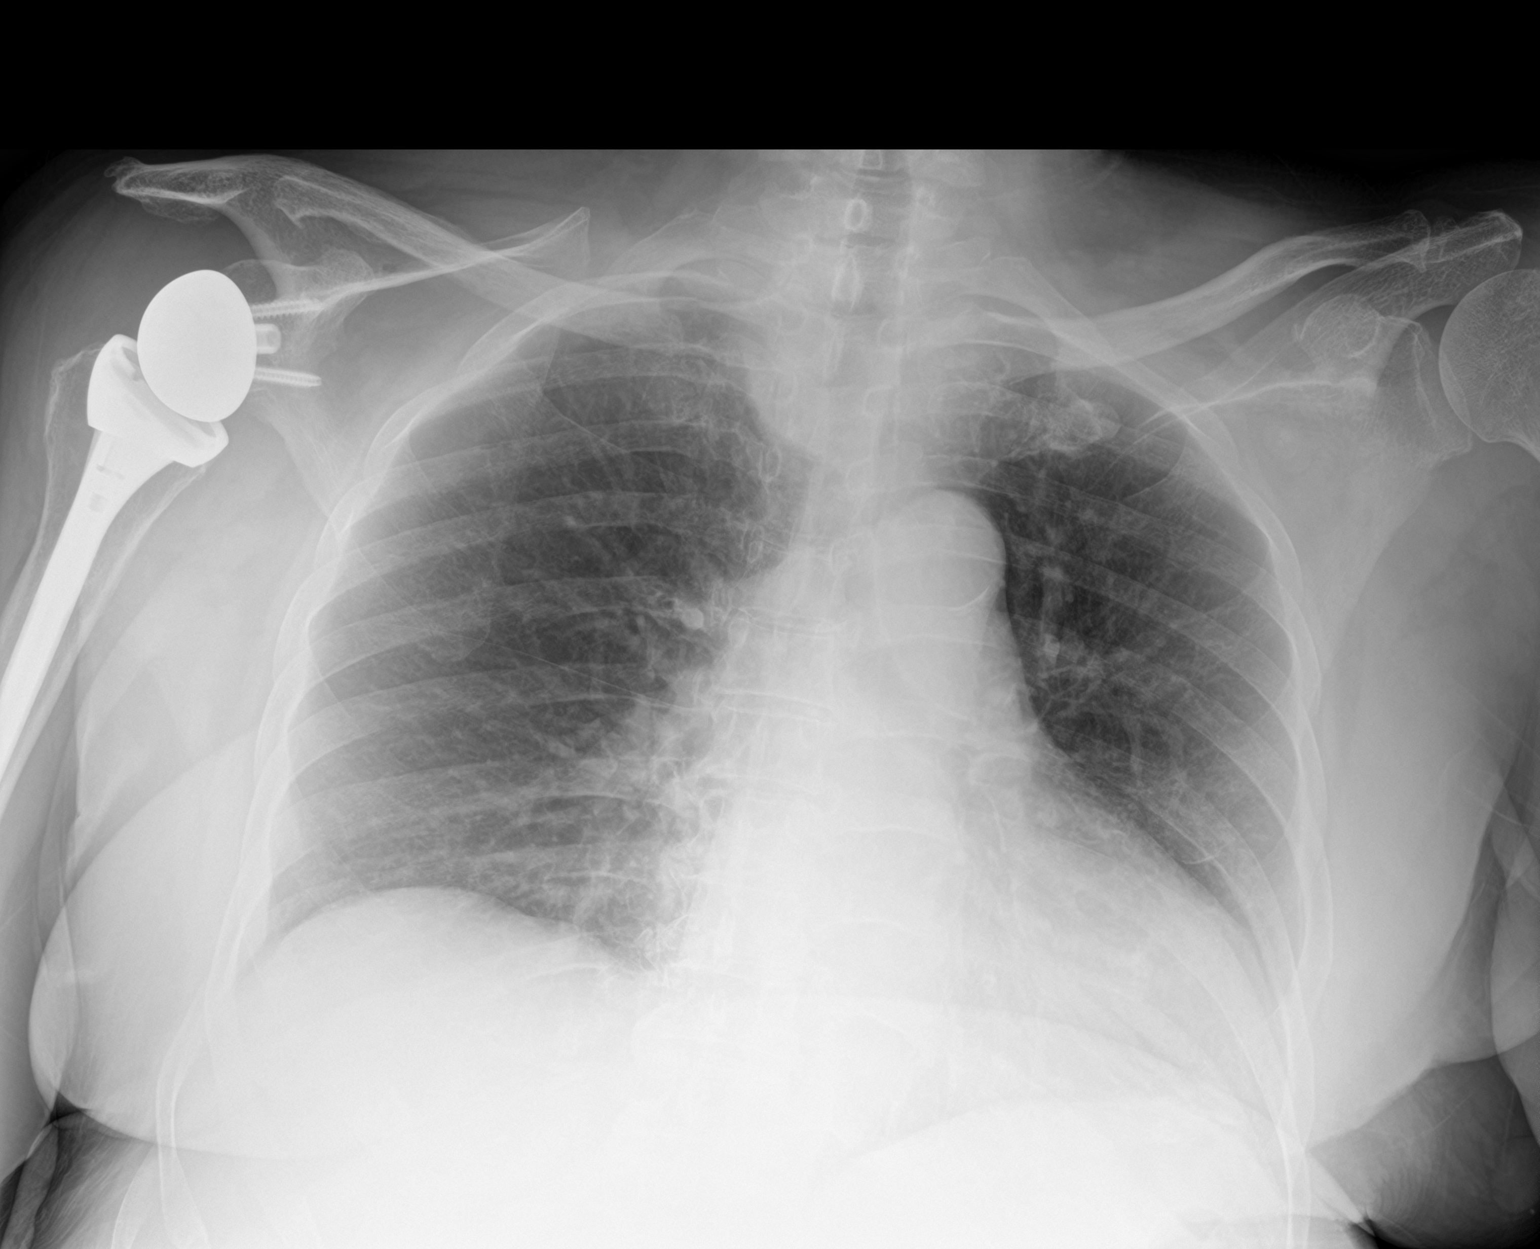

[1 of 1 positions shown; findings below may reference images not displayed]

FINDINGS: Cardiac shadow is enlarged but stable. Aortic calcifications are
noted. The lungs are clear bilaterally. Right shoulder replacement
is noted.
IMPRESSION: No acute abnormality noted.

## 2021-09-19 MED ORDER — FENTANYL CITRATE PF 50 MCG/ML IJ SOSY
25.0000 ug | PREFILLED_SYRINGE | Freq: Once | INTRAMUSCULAR | Status: AC
  Administered 2021-09-19: 25 ug via INTRAVENOUS
  Filled 2021-09-19: qty 1

## 2021-09-19 MED ORDER — OXYCODONE-ACETAMINOPHEN 5-325 MG PO TABS
0.5000 | ORAL_TABLET | ORAL | 0 refills | Status: DC | PRN
Start: 2021-09-19 — End: 2021-11-10
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
## 2021-09-19 NOTE — ED Triage Notes (Signed)
Pt BIB EMS from home for R shoulder pain that radiates down her arm with weakness. Pt tried to take aleve and use icy hot at home and got no relief. Pt tender to the touch    182/90 80 HR 98 RA  CBG 177

## 2021-09-19 NOTE — ED Notes (Signed)
EDP notified that she is a difficult IV start, 2 RNs looked and consulting another

## 2021-09-21 LAB — PATHOLOGIST SMEAR REVIEW

## 2021-09-28 DIAGNOSIS — D692 Other nonthrombocytopenic purpura: Secondary | ICD-10-CM | POA: Diagnosis not present

## 2021-09-28 DIAGNOSIS — Z23 Encounter for immunization: Secondary | ICD-10-CM | POA: Diagnosis not present

## 2021-09-28 DIAGNOSIS — K760 Fatty (change of) liver, not elsewhere classified: Secondary | ICD-10-CM | POA: Diagnosis not present

## 2021-09-28 DIAGNOSIS — M349 Systemic sclerosis, unspecified: Secondary | ICD-10-CM | POA: Diagnosis not present

## 2021-09-28 DIAGNOSIS — M341 CR(E)ST syndrome: Secondary | ICD-10-CM | POA: Diagnosis not present

## 2021-09-28 DIAGNOSIS — Z0000 Encounter for general adult medical examination without abnormal findings: Secondary | ICD-10-CM | POA: Diagnosis not present

## 2021-09-28 DIAGNOSIS — E782 Mixed hyperlipidemia: Secondary | ICD-10-CM | POA: Diagnosis not present

## 2021-09-28 DIAGNOSIS — E1165 Type 2 diabetes mellitus with hyperglycemia: Secondary | ICD-10-CM | POA: Diagnosis not present

## 2021-09-28 DIAGNOSIS — I700 Atherosclerosis of aorta: Secondary | ICD-10-CM | POA: Diagnosis not present

## 2021-09-28 DIAGNOSIS — I10 Essential (primary) hypertension: Secondary | ICD-10-CM | POA: Diagnosis not present

## 2021-09-28 DIAGNOSIS — D6869 Other thrombophilia: Secondary | ICD-10-CM | POA: Diagnosis not present

## 2021-09-28 DIAGNOSIS — M069 Rheumatoid arthritis, unspecified: Secondary | ICD-10-CM | POA: Diagnosis not present
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
## 2021-10-06 ENCOUNTER — Encounter (HOSPITAL_BASED_OUTPATIENT_CLINIC_OR_DEPARTMENT_OTHER): Payer: Self-pay | Admitting: Nurse Practitioner

## 2021-10-06 ENCOUNTER — Ambulatory Visit (INDEPENDENT_AMBULATORY_CARE_PROVIDER_SITE_OTHER): Payer: Medicare Other | Admitting: Nurse Practitioner

## 2021-10-06 ENCOUNTER — Other Ambulatory Visit: Payer: Self-pay

## 2021-10-06 VITALS — BP 132/82 | HR 87 | Ht 68.0 in | Wt 202.2 lb

## 2021-10-06 DIAGNOSIS — E785 Hyperlipidemia, unspecified: Secondary | ICD-10-CM | POA: Diagnosis not present

## 2021-10-06 DIAGNOSIS — Z7901 Long term (current) use of anticoagulants: Secondary | ICD-10-CM | POA: Diagnosis not present

## 2021-10-06 DIAGNOSIS — R072 Precordial pain: Secondary | ICD-10-CM | POA: Diagnosis not present

## 2021-10-06 DIAGNOSIS — I2510 Atherosclerotic heart disease of native coronary artery without angina pectoris: Secondary | ICD-10-CM | POA: Diagnosis not present

## 2021-10-06 DIAGNOSIS — I82409 Acute embolism and thrombosis of unspecified deep veins of unspecified lower extremity: Secondary | ICD-10-CM

## 2021-10-06 DIAGNOSIS — I10 Essential (primary) hypertension: Secondary | ICD-10-CM

## 2021-10-06 NOTE — Patient Instructions (Signed)
Medication Instructions:  Your physician recommends that you continue on your current medications as directed. Please refer to the Current Medication list given to you today.  *If you need a refill on your cardiac medications before your next appointment, please call your pharmacy*   Lab Work: None Ordered If you have labs (blood work) drawn today and your tests are completely normal, you will receive your results only by: [place] (if you have MyChart) OR A paper copy in the mail If you have any lab test that is abnormal or we need to change your treatment, we will call you to review the results.   Testing/Procedures: None Ordered   Follow-Up: At [facility], you and your health needs are our priority.  As part of our continuing mission to provide you with exceptional heart care, we have created designated Provider Care Teams.  These Care Teams include your primary Cardiologist (physician) and Advanced Practice Providers (APPs -  Physician Assistants and Nurse Practitioners) who all work together to provide you with the care you need, when you need it.  We recommend signing up for the patient portal called "MyChart".  Sign up information is provided on this After Visit Summary.  MyChart is used to connect with patients for Virtual Visits (Telemedicine).  Patients are able to view lab/test results, encounter notes, upcoming appointments, etc.  Non-urgent messages can be sent to your provider as well.   To learn more about what you can do with MyChart, go to [email or web address].    Your next appointment:   1 year(s)  The format for your next appointment:   In Person  Provider:   Evalina Field, MD

## 2021-10-08 DIAGNOSIS — H33332 Multiple defects of retina without detachment, left eye: Secondary | ICD-10-CM | POA: Diagnosis not present

## 2021-10-30 DIAGNOSIS — I10 Essential (primary) hypertension: Secondary | ICD-10-CM | POA: Diagnosis not present

## 2021-10-30 DIAGNOSIS — M1990 Unspecified osteoarthritis, unspecified site: Secondary | ICD-10-CM | POA: Diagnosis not present

## 2021-10-30 DIAGNOSIS — E782 Mixed hyperlipidemia: Secondary | ICD-10-CM | POA: Diagnosis not present

## 2021-10-30 DIAGNOSIS — E1165 Type 2 diabetes mellitus with hyperglycemia: Secondary | ICD-10-CM | POA: Diagnosis not present

## 2021-11-05 NOTE — Patient Outreach (Signed)
[facility]) Care Management  11/05/2021  Diane [name] [birth year] [id number]   Received [place] list referral sent patient information to [name], RN Care Coordinator for follow up.  Thank you, [place] Care Management Assistant

## 2021-11-06 ENCOUNTER — Inpatient Hospital Stay (HOSPITAL_COMMUNITY)
Admission: EM | Admit: 2021-11-06 | Discharge: 2021-11-10 | DRG: 392 | Disposition: A | Discharge status: Home / Self Care | Payer: Medicare Other | Attending: Internal Medicine | Admitting: Internal Medicine

## 2021-11-06 ENCOUNTER — Emergency Department (HOSPITAL_COMMUNITY): Payer: Medicare Other

## 2021-11-06 DIAGNOSIS — D509 Iron deficiency anemia, unspecified: Secondary | ICD-10-CM | POA: Diagnosis not present

## 2021-11-06 DIAGNOSIS — Z881 Allergy status to other antibiotic agents status: Secondary | ICD-10-CM

## 2021-11-06 DIAGNOSIS — Z888 Allergy status to other drugs, medicaments and biological substances status: Secondary | ICD-10-CM | POA: Diagnosis not present

## 2021-11-06 DIAGNOSIS — E785 Hyperlipidemia, unspecified: Secondary | ICD-10-CM | POA: Diagnosis not present

## 2021-11-06 DIAGNOSIS — D649 Anemia, unspecified: Secondary | ICD-10-CM | POA: Diagnosis not present

## 2021-11-06 DIAGNOSIS — K5730 Diverticulosis of large intestine without perforation or abscess without bleeding: Secondary | ICD-10-CM | POA: Diagnosis not present

## 2021-11-06 DIAGNOSIS — M05741 Rheumatoid arthritis with rheumatoid factor of right hand without organ or systems involvement: Secondary | ICD-10-CM | POA: Diagnosis not present

## 2021-11-06 DIAGNOSIS — R112 Nausea with vomiting, unspecified: Secondary | ICD-10-CM

## 2021-11-06 DIAGNOSIS — I700 Atherosclerosis of aorta: Secondary | ICD-10-CM | POA: Diagnosis not present

## 2021-11-06 DIAGNOSIS — Z6830 Body mass index (BMI) 30.0-30.9, adult: Secondary | ICD-10-CM

## 2021-11-06 DIAGNOSIS — Z79899 Other long term (current) drug therapy: Secondary | ICD-10-CM

## 2021-11-06 DIAGNOSIS — I2510 Atherosclerotic heart disease of native coronary artery without angina pectoris: Secondary | ICD-10-CM | POA: Diagnosis present

## 2021-11-06 DIAGNOSIS — Z539 Procedure and treatment not carried out, unspecified reason: Secondary | ICD-10-CM | POA: Diagnosis present

## 2021-11-06 DIAGNOSIS — E119 Type 2 diabetes mellitus without complications: Secondary | ICD-10-CM | POA: Diagnosis not present

## 2021-11-06 DIAGNOSIS — Z79631 Long term (current) use of antimetabolite agent: Secondary | ICD-10-CM

## 2021-11-06 DIAGNOSIS — M05742 Rheumatoid arthritis with rheumatoid factor of left hand without organ or systems involvement: Secondary | ICD-10-CM | POA: Diagnosis not present

## 2021-11-06 DIAGNOSIS — K5909 Other constipation: Secondary | ICD-10-CM | POA: Diagnosis not present

## 2021-11-06 DIAGNOSIS — K635 Polyp of colon: Secondary | ICD-10-CM | POA: Diagnosis present

## 2021-11-06 DIAGNOSIS — Z7982 Long term (current) use of aspirin: Secondary | ICD-10-CM

## 2021-11-06 DIAGNOSIS — K560 Paralytic ileus: Secondary | ICD-10-CM | POA: Diagnosis not present

## 2021-11-06 DIAGNOSIS — R933 Abnormal findings on diagnostic imaging of other parts of digestive tract: Secondary | ICD-10-CM

## 2021-11-06 DIAGNOSIS — E860 Dehydration: Secondary | ICD-10-CM | POA: Diagnosis present

## 2021-11-06 DIAGNOSIS — R1084 Generalized abdominal pain: Secondary | ICD-10-CM | POA: Diagnosis not present

## 2021-11-06 DIAGNOSIS — M069 Rheumatoid arthritis, unspecified: Secondary | ICD-10-CM | POA: Diagnosis present

## 2021-11-06 DIAGNOSIS — N179 Acute kidney failure, unspecified: Secondary | ICD-10-CM | POA: Diagnosis present

## 2021-11-06 DIAGNOSIS — K760 Fatty (change of) liver, not elsewhere classified: Secondary | ICD-10-CM | POA: Diagnosis not present

## 2021-11-06 DIAGNOSIS — K5900 Constipation, unspecified: Principal | ICD-10-CM

## 2021-11-06 DIAGNOSIS — E1140 Type 2 diabetes mellitus with diabetic neuropathy, unspecified: Secondary | ICD-10-CM | POA: Diagnosis present

## 2021-11-06 DIAGNOSIS — E1151 Type 2 diabetes mellitus with diabetic peripheral angiopathy without gangrene: Secondary | ICD-10-CM | POA: Diagnosis not present

## 2021-11-06 DIAGNOSIS — Z20822 Contact with and (suspected) exposure to covid-19: Secondary | ICD-10-CM | POA: Diagnosis not present

## 2021-11-06 DIAGNOSIS — R6510 Systemic inflammatory response syndrome (SIRS) of non-infectious origin without acute organ dysfunction: Secondary | ICD-10-CM | POA: Diagnosis present

## 2021-11-06 DIAGNOSIS — I10 Essential (primary) hypertension: Secondary | ICD-10-CM | POA: Diagnosis present

## 2021-11-06 DIAGNOSIS — Z86718 Personal history of other venous thrombosis and embolism: Secondary | ICD-10-CM

## 2021-11-06 DIAGNOSIS — Z885 Allergy status to narcotic agent status: Secondary | ICD-10-CM | POA: Diagnosis not present

## 2021-11-06 DIAGNOSIS — R109 Unspecified abdominal pain: Secondary | ICD-10-CM

## 2021-11-06 DIAGNOSIS — Z7901 Long term (current) use of anticoagulants: Secondary | ICD-10-CM | POA: Diagnosis not present

## 2021-11-06 DIAGNOSIS — E872 Acidosis, unspecified: Secondary | ICD-10-CM | POA: Diagnosis present

## 2021-11-06 DIAGNOSIS — I252 Old myocardial infarction: Secondary | ICD-10-CM | POA: Diagnosis not present

## 2021-11-06 DIAGNOSIS — E663 Overweight: Secondary | ICD-10-CM | POA: Diagnosis present

## 2021-11-06 DIAGNOSIS — K219 Gastro-esophageal reflux disease without esophagitis: Secondary | ICD-10-CM | POA: Diagnosis present

## 2021-11-06 DIAGNOSIS — K567 Ileus, unspecified: Secondary | ICD-10-CM | POA: Diagnosis not present

## 2021-11-06 DIAGNOSIS — R000 Tachycardia, unspecified: Secondary | ICD-10-CM | POA: Diagnosis not present

## 2021-11-06 DIAGNOSIS — R1110 Vomiting, unspecified: Secondary | ICD-10-CM | POA: Diagnosis not present

## 2021-11-06 DIAGNOSIS — Z7984 Long term (current) use of oral hypoglycemic drugs: Secondary | ICD-10-CM

## 2021-11-06 DIAGNOSIS — R935 Abnormal findings on diagnostic imaging of other abdominal regions, including retroperitoneum: Secondary | ICD-10-CM | POA: Diagnosis not present

## 2021-11-06 DIAGNOSIS — D120 Benign neoplasm of cecum: Secondary | ICD-10-CM | POA: Diagnosis not present

## 2021-11-06 DIAGNOSIS — E876 Hypokalemia: Secondary | ICD-10-CM | POA: Diagnosis present

## 2021-11-06 LAB — COMPREHENSIVE METABOLIC PANEL
ALT: 18 U/L (ref 0–44)
AST: 17 U/L (ref 15–41)
Albumin: 3.8 g/dL (ref 3.5–5.0)
Alkaline Phosphatase: 72 U/L (ref 38–126)
Anion gap: 13 (ref 5–15)
BUN: 11 mg/dL (ref 8–23)
CO2: 20 mmol/L — ABNORMAL LOW (ref 22–32)
Calcium: 9.2 mg/dL (ref 8.9–10.3)
Chloride: 104 mmol/L (ref 98–111)
Creatinine, Ser: 0.66 mg/dL (ref 0.44–1.00)
GFR, Estimated: >60 mL/min (ref >60)
Glucose, Bld: 228 mg/dL — ABNORMAL HIGH (ref 70–99)
Potassium: 3.9 mmol/L (ref 3.5–5.1)
Sodium: 137 mmol/L (ref 135–145)
Total Bilirubin: 0.9 mg/dL (ref 0.3–1.2)
Total Protein: 8.1 g/dL (ref 6.5–8.1)

## 2021-11-06 LAB — CBC
HCT: 47.1 % — ABNORMAL HIGH (ref 36.0–46.0)
Hemoglobin: 14.9 g/dL (ref 12.0–15.0)
MCH: 26.8 pg (ref 26.0–34.0)
MCHC: 31.6 g/dL (ref 30.0–36.0)
MCV: 84.7 fL (ref 80.0–100.0)
Platelets: 281 10*3/uL (ref 150–400)
RBC: 5.56 MIL/uL — ABNORMAL HIGH (ref 3.87–5.11)
RDW: 21.6 % — ABNORMAL HIGH (ref 11.5–15.5)
WBC: 12.4 10*3/uL — ABNORMAL HIGH (ref 4.0–10.5)
nRBC: 0 % (ref 0.0–0.2)

## 2021-11-06 LAB — LIPASE, BLOOD: Lipase: 29 U/L (ref 11–51)

## 2021-11-06 IMAGING — CT CT ABD-PELV W/ CM
2 of 5 series · 16 of 46 positions shown, 18 images · IV contrast (omnipaque)
Comparison: 09/14/2021

CLINICAL DATA: Generalized abdominal pain, vomiting

EXAM:
CT ABDOMEN AND PELVIS WITH CONTRAST
TECHNIQUE: Multidetector CT imaging of the abdomen and pelvis was performed
using the standard protocol following bolus administration of
intravenous contrast.
CONTRAST:  100mL OMNIPAQUE IOHEXOL 300 MG/ML  SOLN

[Series 3: abd/ pelvis 5.0 i30f 2 · axial · 0.83mm/px · z∈[-426,+49]mm · 13 of 107 slices shown, 15 images]
[im 6/107  soft-tissue]
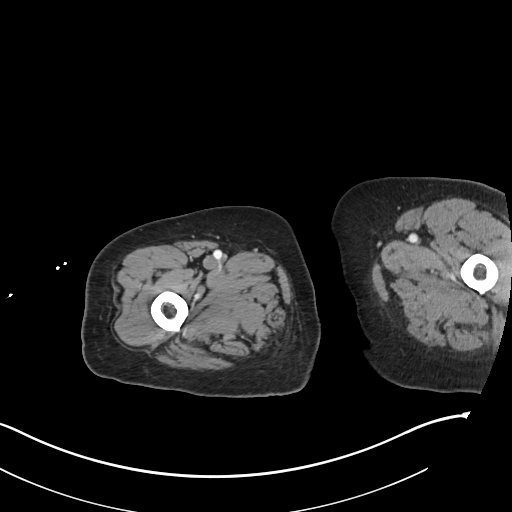
[im 6/107  bone]
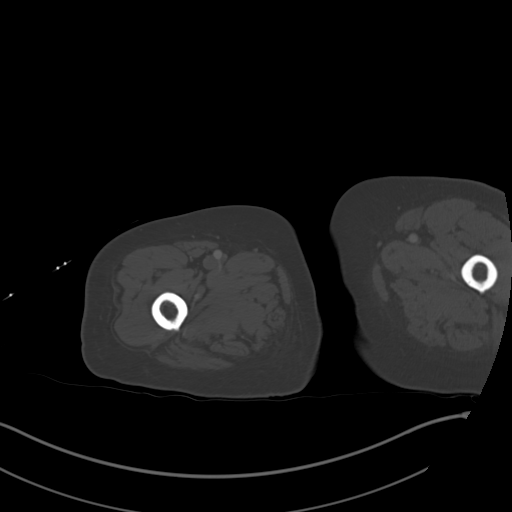
[im 12/107  soft-tissue]
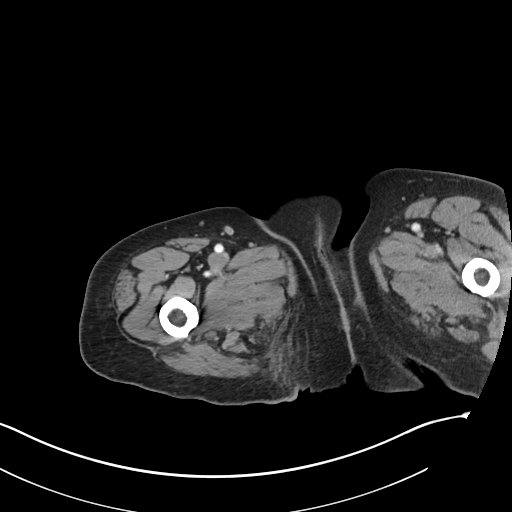
[im 24/107  soft-tissue]
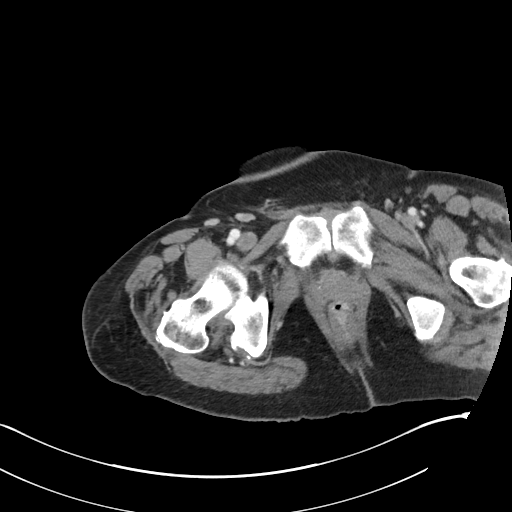
[im 30/107  soft-tissue]
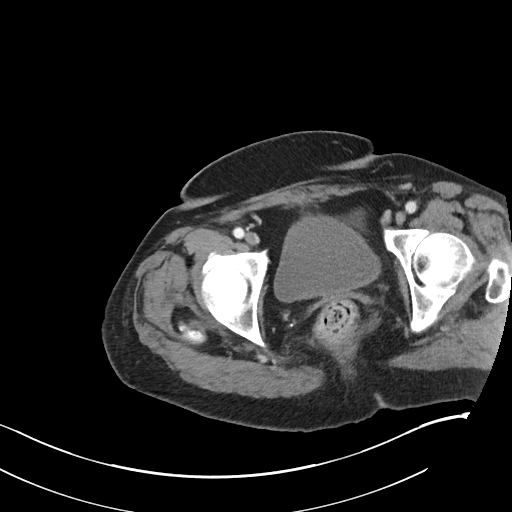
[im 36/107  soft-tissue]
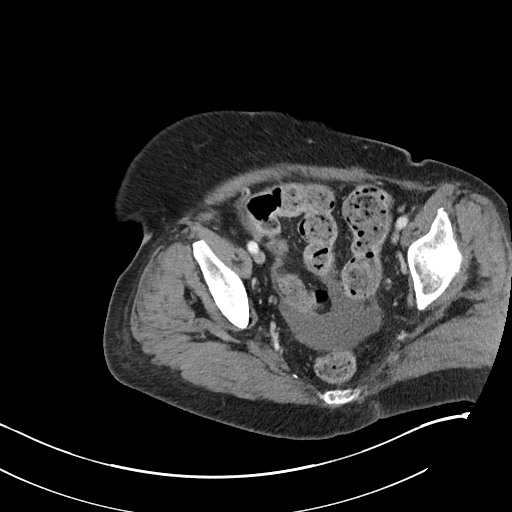
[im 48/107  soft-tissue]
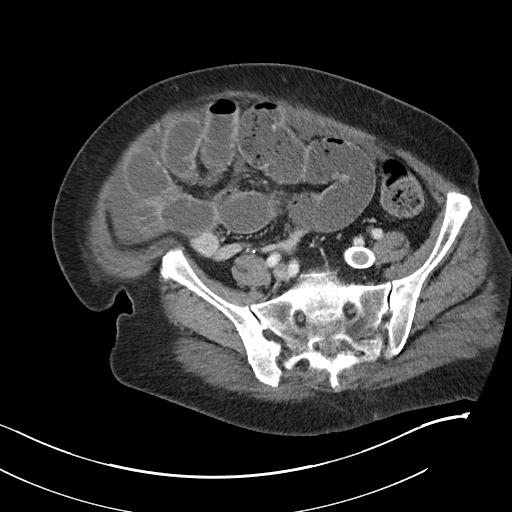
[im 54/107  soft-tissue]
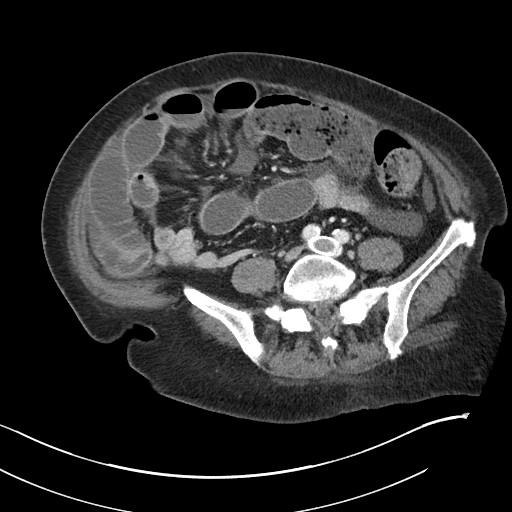
[im 59/107  soft-tissue]
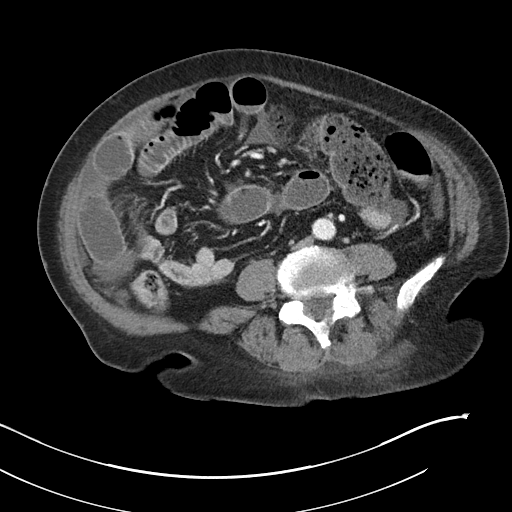
[im 71/107  soft-tissue]
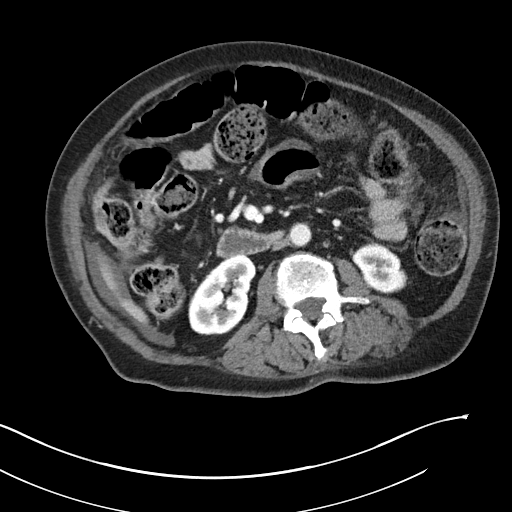
[im 71/107  bone]
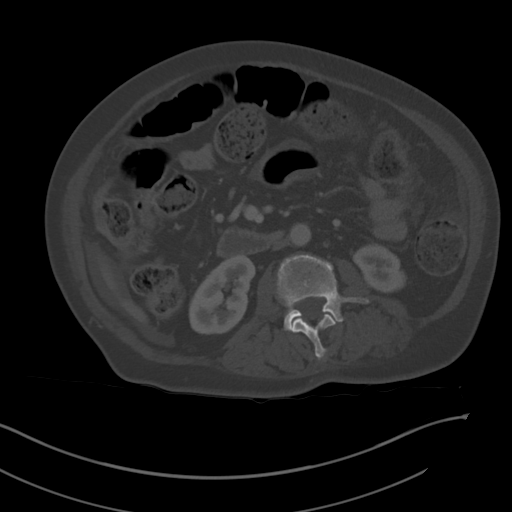
[im 77/107  soft-tissue]
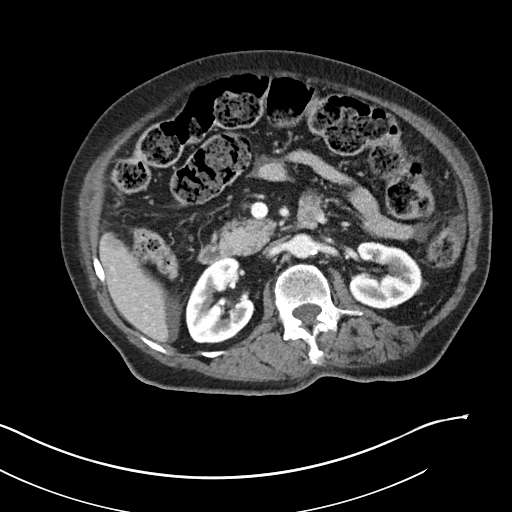
[im 83/107  soft-tissue]
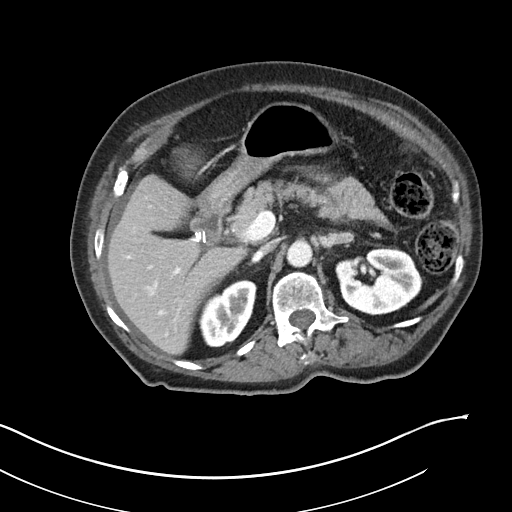
[im 95/107  soft-tissue]
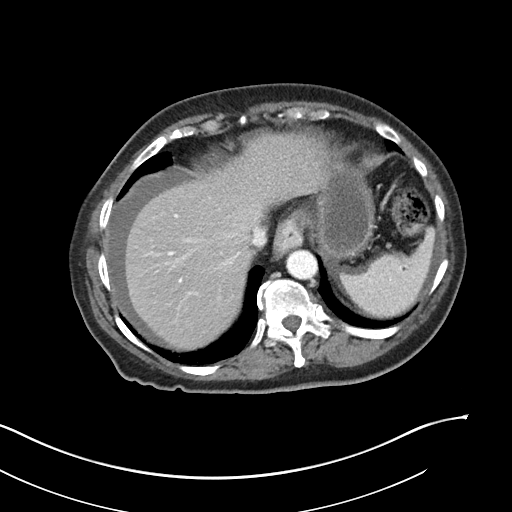
[im 101/107  soft-tissue]
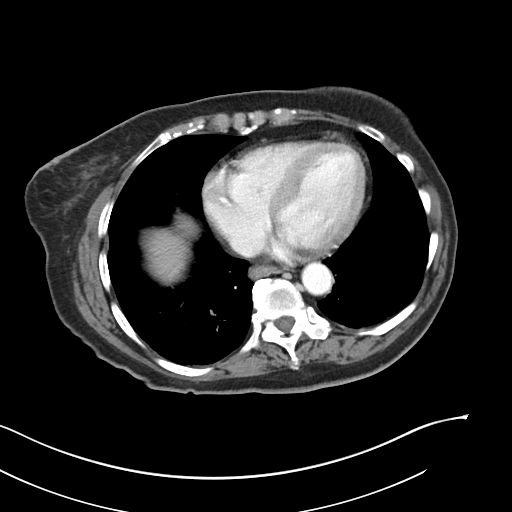

[Series 6: coronal soft tissue · coronal · 1.03mm/px · 3 of 117 slices shown]
[im 39/117  soft-tissue]
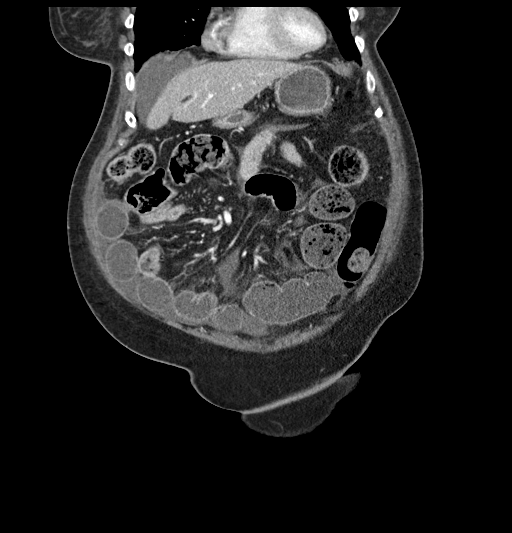
[im 52/117  soft-tissue]
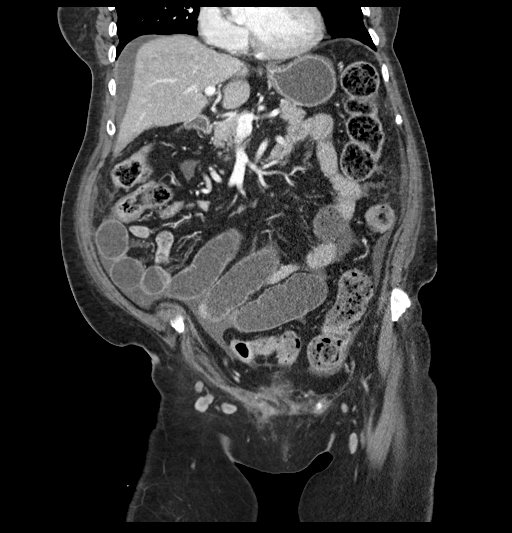
[im 65/117  soft-tissue]
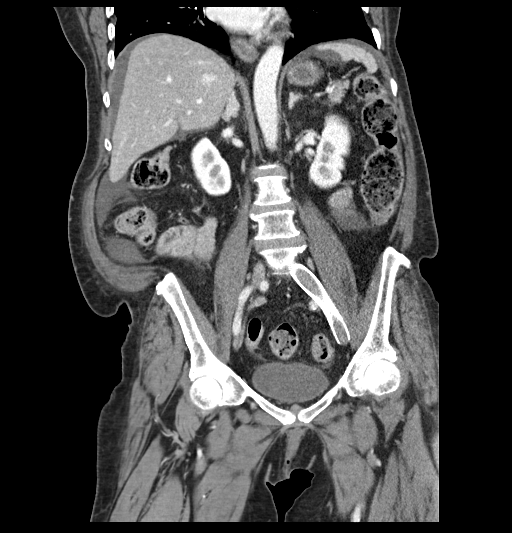

[16 of 46 positions shown; findings below may reference images not displayed]

FINDINGS: Lower chest: No acute pleural or parenchymal lung disease.

Hepatobiliary: Mild diffuse fatty infiltration of the liver. No
focal hepatic abnormalities. Gallbladder is surgically absent.

Pancreas: Unremarkable. No pancreatic ductal dilatation or
surrounding inflammatory changes.

Spleen: Normal in size without focal abnormality.

Adrenals/Urinary Tract: Adrenal glands are unremarkable. Kidneys are
normal, without renal calculi, focal lesion, or hydronephrosis.
Bladder is unremarkable.

Stomach/Bowel: The stomach, duodenum, and proximal jejunum are
decompressed. Within the right lower quadrant, there is change in
caliber at the level of the distal jejunum, with transition point
identified image 56/3. The distal jejunum and ileum are markedly
dilated, with formed stool identified within the ileum. Maximal
caliber of the small bowel measures 5.2 cm. Gas and stool are seen
throughout the colon, with moderate fecal retention again noted. I
do not see a source for colonic obstruction.

No bowel wall thickening.  No inflammatory changes.

Vascular/Lymphatic: Minimal atherosclerosis. Stent extending from
the left external iliac through the left common iliac veins
unchanged. No pathologic adenopathy.

Reproductive: Status post hysterectomy. No adnexal masses.

Other: Small amount of ascites throughout the abdomen and pelvis,
slightly increased since prior study. No free intraperitoneal gas.
No abdominal wall hernia.

Musculoskeletal: No acute or destructive bony lesions. Reconstructed
images demonstrate no additional findings.
IMPRESSION: 1. Dilated distal small bowel involving the distal jejunum through
the ileum. Formed stool within the small bowel, with moderate fecal
retention throughout the colon. I do not see an anatomic source for
distal obstruction, and this could reflect functional obstruction
due to colonic stasis and constipation.
2. Small volume ascites, increased since prior.
3.  Aortic Atherosclerosis ([id number].X).
4. Mild hepatic steatosis.

## 2021-11-06 MED ORDER — ONDANSETRON 4 MG PO TBDP: 4.0000 mg | ORAL_TABLET | Freq: Once | ORAL | Status: DC

## 2021-11-06 MED ORDER — LACTATED RINGERS IV BOLUS
1000.0000 mL | Freq: Once | INTRAVENOUS | Status: AC
  Administered 2021-11-07: 1000 mL via INTRAVENOUS

## 2021-11-06 MED ORDER — FENTANYL CITRATE PF 50 MCG/ML IJ SOSY
50.0000 ug | PREFILLED_SYRINGE | Freq: Once | INTRAMUSCULAR | Status: AC
  Administered 2021-11-06: 50 ug via INTRAVENOUS
  Filled 2021-11-06: qty 1

## 2021-11-06 MED ORDER — IOHEXOL 300 MG/ML  SOLN
100.0000 mL | Freq: Once | INTRAMUSCULAR | Status: AC | PRN
  Administered 2021-11-06: 100 mL via INTRAVENOUS

## 2021-11-06 MED ORDER — ONDANSETRON HCL 4 MG/2ML IJ SOLN
4.0000 mg | Freq: Once | INTRAMUSCULAR | Status: AC
  Administered 2021-11-06: 4 mg via INTRAVENOUS
  Filled 2021-11-06: qty 2

## 2021-11-06 MED ORDER — ACETAMINOPHEN 500 MG PO TABS
1000.0000 mg | ORAL_TABLET | Freq: Once | ORAL | Status: AC
  Administered 2021-11-07: 1000 mg via ORAL
  Filled 2021-11-06: qty 2

## 2021-11-06 NOTE — ED Triage Notes (Signed)
Pt. Stated, ive had stomach pain with N/v since yesterday

## 2021-11-06 NOTE — ED Notes (Signed)
Orene Desanctis, NT at pt bedside for EKG

## 2021-11-06 NOTE — ED Provider Triage Note (Signed)
Emergency Medicine Provider Triage Evaluation Note  Diane [name] , a 76 y.o. female  was evaluated in triage.  Pt complains of generalized abdominal pain, vomiting.  Symptoms began yesterday.  She is unable to have a bowel movement since yesterday and has not been able to pass gas.  She has tried Tylenol with only minimal improvement in her symptoms.  She does have a history of small bowel obstruction and is concerned for the same.  Denies any chest pain, sick contacts with similar symptoms.  Review of Systems  Positive: Abdominal pain, vomiting Negative: Chest pain  Physical Exam  BP (!) 173/102 (BP Location: Right Arm)    Pulse (!) 106    Temp 99.1 F (37.3 C) (Oral)    Resp 18    SpO2 100%  Gen:   Awake, no distress   Resp:  Normal effort  MSK:   Moves extremities without difficulty  Other:  Generalized abdominal tenderness  Medical Decision Making  Medically screening exam initiated at 11:13 AM.  Appropriate orders placed.  [name] was informed that the remainder of the evaluation will be completed by another provider, this initial triage assessment does not replace that evaluation, and the importance of remaining in the ED until their evaluation is complete.  Work-up initiated   Delia Heady, [place] 11/06/21 1114

## 2021-11-06 NOTE — ED Provider Notes (Signed)
[facility] EMERGENCY DEPARTMENT Provider Note   CSN: [id number] Arrival date & time: 11/06/21  1038     History  Chief Complaint  Patient presents with   Nausea   Emesis   Abdominal Pain    Diane [name] is a 76 y.o. female.   Emesis Associated symptoms: abdominal pain   Abdominal Pain Associated symptoms: vomiting    76 year old female with a history of DM2, hypertension, GERD, arthritis, prolonged QT, history of hiatal hernia, constipation who presents emergency department with generalized abdominal pain, nausea, vomiting.  Patient states that symptoms started earlier this morning prior to presentation to the emergency department.  She has been unable to tolerate oral intake all day.  She has a history of prior presentations to the emergency department for this where work-ups were concerning for possible constipation versus ileus versus small bowel obstruction.  She has been endorsing mild bilious emesis.  Her abdominal discomfort is sharp, generalized in location.  She does endorse taking MiraLAX for constipation.  She denies any chest pain or shortness of breath.`  On chart review, the patient did present to the emergency department in September and was discharged on 07/15/2021 after a similar presentation with abdominal pain, nausea, vomiting, low-grade fever.  There was some concern that she would have another bowel obstruction with a CT scan that ultimately showed a large amount of stool throughout the colon and rectum, thought to be secondary to slow transit though partial obstruction at the level of the ileocecal valve was also thought to be possible.  She was admitted to the hospital and seen by general surgery who felt more reassured that her symptoms and presentation were not consistent with bowel obstruction.  Home Medications Prior to Admission medications   Medication Sig Start Date End Date Taking? Authorizing Provider  acetaminophen (TYLENOL) 500 MG  tablet Take 1,000 mg by mouth every 6 (six) hours as needed for moderate pain or headache.   Yes [provider]  amLODipine (NORVASC) 5 MG tablet Take 5 mg by mouth daily.   Yes [provider]  apixaban (ELIQUIS) 5 MG TABS tablet Take 5 mg by mouth 2 (two) times daily.   Yes [provider]  aspirin EC 81 MG tablet Take 81 mg by mouth daily. Swallow whole.   Yes [provider]  docusate sodium (COLACE) 100 MG capsule Take 1 capsule (100 mg total) by mouth 2 (two) times daily. 05/27/21  Yes Lavina Hamman, MD  DULoxetine (CYMBALTA) 20 MG capsule Take 20 mg by mouth every morning. 04/30/21  Yes [provider]  folic acid (FOLVITE) 1 MG tablet Take 1 tablet (1 mg total) by mouth daily. 02/26/21  Yes Medina-Vargas, Monina C, NP  furosemide (LASIX) 20 MG tablet Take 20 mg by mouth daily. 09/10/21  Yes [provider]  gabapentin (NEURONTIN) 300 MG capsule Take 300 mg by mouth at bedtime.   Yes [provider]  losartan (COZAAR) 100 MG tablet Take 100 mg by mouth daily. 07/07/21  Yes [provider]  metFORMIN (GLUCOPHAGE-XR) 500 MG 24 hr tablet Take 1,000 mg by mouth daily with breakfast.   Yes [provider]  methotrexate (RHEUMATREX) 2.5 MG tablet Take 8 tablets (20 mg total) by mouth once a week. 8 tablets weekly (Sunday) Patient taking differently: Take 20 mg by mouth every Sunday. 8 tabs 02/26/21  Yes Medina-Vargas, Monina C, NP  naproxen sodium (ALEVE) 220 MG tablet Take 440 mg by mouth daily as  needed (pain).   Yes [provider]  oxybutynin (DITROPAN-XL) 5 MG 24 hr tablet Take 5 mg by mouth daily. 08/12/21  Yes [provider]  pantoprazole (PROTONIX) 40 MG tablet Take 1 tablet (40 mg total) by mouth daily. 07/03/21  Yes Patrecia Pour, MD  polyethylene glycol (MIRALAX / GLYCOLAX) 17 g packet Take 17 g by mouth 2 (two) times daily. Patient taking differently: Take 17 g by mouth daily as needed for  mild constipation. 05/27/21  Yes Lavina Hamman, MD  rosuvastatin (CRESTOR) 10 MG tablet Take 1 tablet (10 mg total) by mouth daily. 02/26/21  Yes Medina-Vargas, Monina C, NP  simethicone (MYLICON) 80 MG chewable tablet Chew 1 tablet (80 mg total) by mouth 4 (four) times daily. Patient taking differently: Chew 80 mg by mouth every 6 (six) hours as needed for flatulence. 05/27/21  Yes Lavina Hamman, MD  telmisartan (MICARDIS) 80 MG tablet Take 80 mg by mouth at bedtime. 10/12/21  Yes [provider]  oxyCODONE-acetaminophen (PERCOCET) 5-325 MG tablet Take 0.5-1 tablets by mouth every 4 (four) hours as needed. Patient not taking: Reported on 11/07/2021 09/19/21   Orpah Greek, MD      Allergies    Cephalexin, Tapentadol, Codeine, and Nucynta [tapentadol hcl]    Review of Systems   Review of Systems  Gastrointestinal:  Positive for abdominal pain and vomiting.   Physical Exam Updated Vital Signs BP (!) 162/83    Pulse 94    Temp 100.3 F (37.9 C) (Oral)    Resp 16    SpO2 96%  Physical Exam Vitals and nursing note reviewed.  Constitutional:      General: She is not in acute distress.    Appearance: She is well-developed.  HENT:     Head: Normocephalic and atraumatic.  Eyes:     Conjunctiva/sclera: Conjunctivae normal.     Pupils: Pupils are equal, round, and reactive to light.  Cardiovascular:     Rate and Rhythm: Normal rate and regular rhythm.     Heart sounds: No murmur heard. Pulmonary:     Effort: Pulmonary effort is normal. No respiratory distress.     Breath sounds: Normal breath sounds.  Abdominal:     General: There is distension.     Palpations: Abdomen is soft.     Tenderness: There is generalized abdominal tenderness. There is guarding and rebound.  Musculoskeletal:        General: No swelling, deformity or signs of injury.     Cervical back: Neck supple.  Skin:    General: Skin is warm and dry.     Capillary Refill: Capillary refill takes less  than 2 seconds.     Findings: No lesion or rash.  Neurological:     General: No focal deficit present.     Mental Status: She is alert. Mental status is at baseline.  Psychiatric:        Mood and Affect: Mood normal.    ED Results / Procedures / Treatments   Labs (all labs ordered are listed, but only abnormal results are displayed) Labs Reviewed  COMPREHENSIVE METABOLIC PANEL - Abnormal; Notable for the following components:      Result Value   CO2 20 (*)    Glucose, Bld 228 (*)    All other components within normal limits  CBC - Abnormal; Notable for the following components:   WBC 12.4 (*)    RBC 5.56 (*)    HCT 47.1 (*)  RDW 21.6 (*)    All other components within normal limits  URINALYSIS, ROUTINE W REFLEX MICROSCOPIC - Abnormal; Notable for the following components:   APPearance CLOUDY (*)    Bilirubin Urine SMALL (*)    All other components within normal limits  LACTIC ACID, PLASMA - Abnormal; Notable for the following components:   Lactic Acid, Venous 2.0 (*)    All other components within normal limits  RESP PANEL BY RT-PCR (FLU A&B, COVID) ARPGX2  LIPASE, BLOOD  LACTIC ACID, PLASMA  TROPONIN I (HIGH SENSITIVITY)    EKG EKG Interpretation  Date/Time:  Friday November 06 2021 23:22:50 EST Ventricular Rate:  115 PR Interval:  136 QRS Duration: 98 QT Interval:  368 QTC Calculation: 509 R Axis:   37 Text Interpretation: Sinus tachycardia Nonspecific T wave abnormality Abnormal ECG When compared with ECG of 06-Nov-2021 23:22, PREVIOUS ECG IS PRESENT Confirmed by Regan Lemming ([id number]) on 11/06/2021 11:35:04 PM  Radiology CT ABDOMEN PELVIS W CONTRAST  Result Date: 11/06/2021 CLINICAL DATA:  Generalized abdominal pain, vomiting EXAM: CT ABDOMEN AND PELVIS WITH CONTRAST TECHNIQUE: Multidetector CT imaging of the abdomen and pelvis was performed using the standard protocol following bolus administration of intravenous contrast. CONTRAST:  129mL OMNIPAQUE IOHEXOL 300  MG/ML  SOLN COMPARISON:  09/14/2021 FINDINGS: Lower chest: No acute pleural or parenchymal lung disease. Hepatobiliary: Mild diffuse fatty infiltration of the liver. No focal hepatic abnormalities. Gallbladder is surgically absent. Pancreas: Unremarkable. No pancreatic ductal dilatation or surrounding inflammatory changes. Spleen: Normal in size without focal abnormality. Adrenals/Urinary Tract: Adrenal glands are unremarkable. Kidneys are normal, without renal calculi, focal lesion, or hydronephrosis. Bladder is unremarkable. Stomach/Bowel: The stomach, duodenum, and proximal jejunum are decompressed. Within the right lower quadrant, there is change in caliber at the level of the distal jejunum, with transition point identified image 56/3. The distal jejunum and ileum are markedly dilated, with formed stool identified within the ileum. Maximal caliber of the small bowel measures 5.2 cm. Gas and stool are seen throughout the colon, with moderate fecal retention again noted. I do not see a source for colonic obstruction. No bowel wall thickening.  No inflammatory changes. Vascular/Lymphatic: Minimal atherosclerosis. Stent extending from the left external iliac through the left common iliac veins unchanged. No pathologic adenopathy. Reproductive: Status post hysterectomy. No adnexal masses. Other: Small amount of ascites throughout the abdomen and pelvis, slightly increased since prior study. No free intraperitoneal gas. No abdominal wall hernia. Musculoskeletal: No acute or destructive bony lesions. Reconstructed images demonstrate no additional findings. IMPRESSION: 1. Dilated distal small bowel involving the distal jejunum through the ileum. Formed stool within the small bowel, with moderate fecal retention throughout the colon. I do not see an anatomic source for distal obstruction, and this could reflect functional obstruction due to colonic stasis and constipation. 2. Small volume ascites, increased since prior.  3.  Aortic Atherosclerosis (ICD10-I70.0). 4. Mild hepatic steatosis. Electronically Signed   By: Randa Ngo M.D.   On: 11/06/2021 15:11    Procedures Procedures    Medications Ordered in ED Medications  iohexol (OMNIPAQUE) 300 MG/ML solution 100 mL (100 mLs Intravenous Contrast Given 11/06/21 1444)  lactated ringers bolus 1,000 mL (0 mLs Intravenous Stopped 11/07/21 0519)  ondansetron (ZOFRAN) injection 4 mg (4 mg Intravenous Given 11/06/21 2358)  acetaminophen (TYLENOL) tablet 1,000 mg (1,000 mg Oral Given 11/07/21 0000)  fentaNYL (SUBLIMAZE) injection 50 mcg (50 mcg Intravenous Given 11/06/21 2357)  Glycerin (Adult) 2 g suppository 1 suppository (1 suppository Rectal Given  11/07/21 0126)  sodium phosphate (FLEET) 7-19 GM/118ML enema 1 enema (1 enema Rectal Given 11/07/21 0128)  lactated ringers bolus 250 mL (0 mLs Intravenous Stopped 11/07/21 0519)  fentaNYL (SUBLIMAZE) injection 50 mcg (50 mcg Intravenous Given 11/07/21 0631)    ED Course/ Medical Decision Making/ A&P Clinical Course as of 11/07/21 0654  Sat Nov 07, 2021  0615 Temp: 100.3 F (37.9 C) [JL]  0615 Lactic Acid, Venous(!!): 2.0 [JL]  0615 WBC(!): 12.4 [JL]    Clinical Course User Index [JL] Regan Lemming, MD                           Medical Decision Making  76 year old female with a history of DM2, hypertension, GERD, arthritis, prolonged QT, history of hiatal hernia, constipation who presents emergency department with generalized abdominal pain, nausea, vomiting.  Patient states that symptoms started earlier this morning prior to presentation to the emergency department.  She has been unable to tolerate oral intake all day.  She has a history of prior presentations to the emergency department for this where work-ups were concerning for possible constipation versus ileus versus small bowel obstruction.  She has been endorsing mild bilious emesis.  Her abdominal discomfort is sharp, generalized in location.  She does endorse taking  MiraLAX for constipation.  She denies any chest pain or shortness of breath.`  On chart review, the patient did present to the emergency department in September and was discharged on 07/15/2021 after a similar presentation with abdominal pain, nausea, vomiting, low-grade fever.  There was some concern that she would have another bowel obstruction with a CT scan that ultimately showed a large amount of stool throughout the colon and rectum, thought to be secondary to slow transit though partial obstruction at the level of the ileocecal valve was also thought to be possible.  She was admitted to the hospital and seen by general surgery who felt more reassured that her symptoms and presentation were not consistent with bowel obstruction.  On arrival, the patient was borderline febrile, T99.1, subsequently rechecked and found to be borderline febrile to 100.3, tachycardic, pulse 106, not tachypneic, hypertensive BP 173/102, saturating her percent on room air.  An EKG was performed and revealed sinus tachycardia, nonspecific T wave changes present.  Laboratory work-up significant for urinalysis without evidence of UTI, initial lactic acid 2.0, repeat lactic acid 1.7, COVID-19 and influenza PCR testing resulted negative.  CMP revealed hyperglycemia to 228 with no anion gap acidosis, mildly decreased bicarb of 20, no electrolyte abnormality beyond this and no change in renal or liver function.  A CBC revealed a leukocytosis to 12.4, no significant anemia or platelet abnormality.  CT abdomen pelvis was reviewed by myself, general surgery in consultation and radiology.  There is stool all throughout the large bowel backing up into the small bowel with no evidence of a transition point to indicate bowel obstruction.  Discussed the case with on-call general surgery who reviewed the patient's CT imaging and felt that the patient's presentation was less consistent with an acute bowel obstruction at this time.  Felt that  symptoms were most likely consistent and due to constipation.  The patient was provided with a glycerin suppository and a fleets enema which resulted in 2 large bowel movements in the emergency department.  Following this, the patient had some improvement in her pain but continued to endorse pain and has significant tenderness on palpation.  Given her leukocytosis, borderline fever, significantly  distended colon from constipation, concern for developing stercoral colitis versus worsening constipation.  The patient has been unable to tolerate oral intake throughout the past 24 hours with persistent nausea and vomiting associated with her likely constipation.  Given this concern, I recommended that the patient be admitted for further enemas, continued observation, IV fluid resuscitation and further management.  Hospitalist medicine was consulted for admission.     Final Clinical Impression(s) / ED Diagnoses Final diagnoses:  Constipation, unspecified constipation type  Generalized abdominal pain  Nausea and vomiting, unspecified vomiting type    Rx / DC Orders ED Discharge Orders     None         Regan Lemming, MD 11/07/21 [phone]

## 2021-11-07 ENCOUNTER — Observation Stay (HOSPITAL_COMMUNITY): Payer: Medicare Other

## 2021-11-07 ENCOUNTER — Other Ambulatory Visit: Payer: Self-pay

## 2021-11-07 ENCOUNTER — Encounter (HOSPITAL_COMMUNITY): Payer: Self-pay | Admitting: Internal Medicine

## 2021-11-07 DIAGNOSIS — K219 Gastro-esophageal reflux disease without esophagitis: Secondary | ICD-10-CM

## 2021-11-07 DIAGNOSIS — Z86718 Personal history of other venous thrombosis and embolism: Secondary | ICD-10-CM | POA: Diagnosis not present

## 2021-11-07 DIAGNOSIS — R1084 Generalized abdominal pain: Secondary | ICD-10-CM | POA: Diagnosis not present

## 2021-11-07 DIAGNOSIS — K560 Paralytic ileus: Secondary | ICD-10-CM | POA: Diagnosis not present

## 2021-11-07 DIAGNOSIS — R1110 Vomiting, unspecified: Secondary | ICD-10-CM | POA: Diagnosis not present

## 2021-11-07 DIAGNOSIS — E872 Acidosis, unspecified: Secondary | ICD-10-CM

## 2021-11-07 DIAGNOSIS — K5900 Constipation, unspecified: Secondary | ICD-10-CM | POA: Diagnosis not present

## 2021-11-07 DIAGNOSIS — R935 Abnormal findings on diagnostic imaging of other abdominal regions, including retroperitoneum: Secondary | ICD-10-CM | POA: Diagnosis not present

## 2021-11-07 DIAGNOSIS — E1140 Type 2 diabetes mellitus with diabetic neuropathy, unspecified: Secondary | ICD-10-CM

## 2021-11-07 DIAGNOSIS — R6510 Systemic inflammatory response syndrome (SIRS) of non-infectious origin without acute organ dysfunction: Secondary | ICD-10-CM

## 2021-11-07 DIAGNOSIS — I10 Essential (primary) hypertension: Secondary | ICD-10-CM | POA: Diagnosis not present

## 2021-11-07 DIAGNOSIS — M05741 Rheumatoid arthritis with rheumatoid factor of right hand without organ or systems involvement: Secondary | ICD-10-CM

## 2021-11-07 DIAGNOSIS — R109 Unspecified abdominal pain: Secondary | ICD-10-CM | POA: Diagnosis not present

## 2021-11-07 DIAGNOSIS — M05742 Rheumatoid arthritis with rheumatoid factor of left hand without organ or systems involvement: Secondary | ICD-10-CM

## 2021-11-07 LAB — BASIC METABOLIC PANEL
Anion gap: 11 (ref 5–15)
BUN: 30 mg/dL — ABNORMAL HIGH (ref 8–23)
CO2: 23 mmol/L (ref 22–32)
Calcium: 8.5 mg/dL — ABNORMAL LOW (ref 8.9–10.3)
Chloride: 104 mmol/L (ref 98–111)
Creatinine, Ser: 1.19 mg/dL — ABNORMAL HIGH (ref 0.44–1.00)
GFR, Estimated: 48 mL/min — ABNORMAL LOW (ref >60)
Glucose, Bld: 168 mg/dL — ABNORMAL HIGH (ref 70–99)
Potassium: 3.7 mmol/L (ref 3.5–5.1)
Sodium: 138 mmol/L (ref 135–145)

## 2021-11-07 LAB — GLUCOSE, CAPILLARY: Glucose-Capillary: 103 mg/dL — ABNORMAL HIGH (ref 70–99)

## 2021-11-07 LAB — URINALYSIS, ROUTINE W REFLEX MICROSCOPIC
Glucose, UA: NEGATIVE mg/dL
Hgb urine dipstick: NEGATIVE
Ketones, ur: NEGATIVE mg/dL
Leukocytes,Ua: NEGATIVE
Nitrite: NEGATIVE
Protein, ur: NEGATIVE mg/dL
Specific Gravity, Urine: 1.02 (ref 1.005–1.030)
pH: 5 (ref 5.0–8.0)

## 2021-11-07 LAB — CBC
HCT: 39 % (ref 36.0–46.0)
Hemoglobin: 12.1 g/dL (ref 12.0–15.0)
MCH: 26.1 pg (ref 26.0–34.0)
MCHC: 31 g/dL (ref 30.0–36.0)
MCV: 84.2 fL (ref 80.0–100.0)
Platelets: 268 10*3/uL (ref 150–400)
RBC: 4.63 MIL/uL (ref 3.87–5.11)
RDW: 21.1 % — ABNORMAL HIGH (ref 11.5–15.5)
WBC: 6.8 10*3/uL (ref 4.0–10.5)
nRBC: 0 % (ref 0.0–0.2)

## 2021-11-07 LAB — RESP PANEL BY RT-PCR (FLU A&B, COVID) ARPGX2
Influenza A by PCR: NEGATIVE
Influenza B by PCR: NEGATIVE
SARS Coronavirus 2 by RT PCR: NEGATIVE

## 2021-11-07 LAB — MAGNESIUM: Magnesium: 1.9 mg/dL (ref 1.7–2.4)

## 2021-11-07 LAB — LACTIC ACID, PLASMA
Lactic Acid, Venous: 2 mmol/L (ref 0.5–1.9)
Lactic Acid, Venous: 1.7 mmol/L (ref 0.5–1.9)

## 2021-11-07 LAB — HEPARIN LEVEL (UNFRACTIONATED): Heparin Unfractionated: >1.1 IU/mL — ABNORMAL HIGH (ref 0.30–0.70)

## 2021-11-07 LAB — HEMOGLOBIN A1C
Hgb A1c MFr Bld: 6.5 % — ABNORMAL HIGH (ref 4.8–5.6)
Mean Plasma Glucose: 139.85 mg/dL

## 2021-11-07 LAB — CBG MONITORING, ED
Glucose-Capillary: 166 mg/dL — ABNORMAL HIGH (ref 70–99)
Glucose-Capillary: 138 mg/dL — ABNORMAL HIGH (ref 70–99)
Glucose-Capillary: 133 mg/dL — ABNORMAL HIGH (ref 70–99)

## 2021-11-07 LAB — TROPONIN I (HIGH SENSITIVITY)
Troponin I (High Sensitivity): 14 ng/L (ref <18)
Troponin I (High Sensitivity): 13 ng/L (ref <18)

## 2021-11-07 LAB — APTT: aPTT: 42 seconds — ABNORMAL HIGH (ref 24–36)

## 2021-11-07 MED ORDER — SODIUM CHLORIDE 0.9% FLUSH
3.0000 mL | Freq: Two times a day (BID) | INTRAVENOUS | Status: DC
  Administered 2021-11-08 – 2021-11-10 (×2): 3 mL via INTRAVENOUS

## 2021-11-07 MED ORDER — ACETAMINOPHEN 650 MG RE SUPP: 650.0000 mg | Freq: Four times a day (QID) | RECTAL | Status: DC | PRN

## 2021-11-07 MED ORDER — SODIUM CHLORIDE 0.9 % IV SOLN: INTRAVENOUS | Status: DC

## 2021-11-07 MED ORDER — HEPARIN (PORCINE) 25000 UT/250ML-% IV SOLN
1300.0000 [IU]/h | INTRAVENOUS | Status: DC
  Administered 2021-11-07: 1150 [IU]/h via INTRAVENOUS
  Filled 2021-11-07: qty 250

## 2021-11-07 MED ORDER — FENTANYL CITRATE PF 50 MCG/ML IJ SOSY
50.0000 ug | PREFILLED_SYRINGE | Freq: Once | INTRAMUSCULAR | Status: AC
  Administered 2021-11-07: 50 ug via INTRAVENOUS
  Filled 2021-11-07: qty 1

## 2021-11-07 MED ORDER — ACETAMINOPHEN 325 MG PO TABS: 650.0000 mg | ORAL_TABLET | Freq: Four times a day (QID) | ORAL | Status: DC | PRN

## 2021-11-07 MED ORDER — LACTATED RINGERS IV BOLUS
250.0000 mL | Freq: Once | INTRAVENOUS | Status: AC
  Administered 2021-11-07: 250 mL via INTRAVENOUS

## 2021-11-07 MED ORDER — SORBITOL 70 % SOLN
400.0000 mL | TOPICAL_OIL | Freq: Once | ORAL | Status: AC
  Administered 2021-11-07: 400 mL via RECTAL
  Filled 2021-11-07 (×2): qty 120

## 2021-11-07 MED ORDER — SENNOSIDES-DOCUSATE SODIUM 8.6-50 MG PO TABS
1.0000 | ORAL_TABLET | Freq: Two times a day (BID) | ORAL | Status: DC
  Administered 2021-11-07 – 2021-11-09 (×5): 1 via ORAL
  Filled 2021-11-07 (×5): qty 1

## 2021-11-07 MED ORDER — ALBUTEROL SULFATE (2.5 MG/3ML) 0.083% IN NEBU: 2.5000 mg | INHALATION_SOLUTION | Freq: Four times a day (QID) | RESPIRATORY_TRACT | Status: DC | PRN

## 2021-11-07 MED ORDER — INSULIN ASPART 100 UNIT/ML IJ SOLN
0.0000 [IU] | Freq: Three times a day (TID) | INTRAMUSCULAR | Status: DC
  Administered 2021-11-07: 08:00:00 2 [IU] via SUBCUTANEOUS
  Administered 2021-11-07: 1 [IU] via SUBCUTANEOUS

## 2021-11-07 MED ORDER — SIMETHICONE 80 MG PO CHEW: 80.0000 mg | CHEWABLE_TABLET | Freq: Four times a day (QID) | ORAL | Status: DC | PRN

## 2021-11-07 MED ORDER — GLYCERIN (LAXATIVE) 2 G RE SUPP
1.0000 | Freq: Once | RECTAL | Status: AC
  Administered 2021-11-07: 1 via RECTAL
  Filled 2021-11-07: qty 1

## 2021-11-07 MED ORDER — FENTANYL CITRATE PF 50 MCG/ML IJ SOSY
25.0000 ug | PREFILLED_SYRINGE | INTRAMUSCULAR | Status: DC | PRN
  Administered 2021-11-07: 25 ug via INTRAVENOUS
  Filled 2021-11-07: qty 1

## 2021-11-07 MED ORDER — FLEET ENEMA 7-19 GM/118ML RE ENEM
1.0000 | ENEMA | Freq: Once | RECTAL | Status: AC
  Administered 2021-11-07: 1 via RECTAL
  Filled 2021-11-07: qty 1

## 2021-11-07 MED ORDER — PANTOPRAZOLE SODIUM 40 MG IV SOLR
40.0000 mg | Freq: Two times a day (BID) | INTRAVENOUS | Status: DC
  Administered 2021-11-07 – 2021-11-10 (×7): 40 mg via INTRAVENOUS
  Filled 2021-11-07 (×7): qty 40

## 2021-11-07 MED ORDER — LACTULOSE 10 GM/15ML PO SOLN
20.0000 g | Freq: Once | ORAL | Status: AC
  Administered 2021-11-07: 20 g via ORAL
  Filled 2021-11-07: qty 30

## 2021-11-07 MED ORDER — ENOXAPARIN SODIUM 40 MG/0.4ML IJ SOSY: 40.0000 mg | PREFILLED_SYRINGE | INTRAMUSCULAR | Status: DC
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
## 2021-11-07 NOTE — ED Notes (Signed)
Pt c/o need to use the bathroom. This RN assisted pt into wheelchair and transported pt into the bathroom. Urine sample collected; pt's brief changed; and pt's gown changed due to stain on her gown from prior emesis. RN assisted pt back into bed.

## 2021-11-07 NOTE — ED Notes (Signed)
RN told Dr. Armandina Gemma, EDP, of pt's lactic acid 2.0

## 2021-11-07 NOTE — Progress Notes (Signed)
ANTICOAGULATION CONSULT NOTE - Follow Up Consult  Pharmacy Consult for heparin Indication: Hx DVT  Allergies  Allergen Reactions   Cephalexin Other (See Comments)    URINARY RETENTION = "blocked my urine"   Tapentadol     Other reaction(s): zombie Other reaction(s): zombie   Codeine Hives and Rash   Nucynta [Tapentadol Hcl] Other (See Comments)    Altered mental status    Patient Measurements: Height: 5\' 8"  (172.7 cm) Weight: 92 kg (202 lb 13.2 oz) IBW/kg (Calculated) : 63.9 Heparin Dosing Weight: 83kg  Vital Signs: Temp: 98.1 F (36.7 C) (01/07 2000) Temp Source: Oral (01/07 2000) BP: 126/62 (01/07 2000) Pulse Rate: 95 (01/07 2000)  Labs: Recent Labs    11/06/21 1109 11/07/21 0649 11/07/21 0713 11/07/21 2152 11/07/21 2235  HGB 14.9  --  12.1  --   --   HCT 47.1*  --  39.0  --   --   PLT 281  --  268  --   --   APTT  --   --   --   --  42*  HEPARINUNFRC  --   --   --   --  >1.10*  CREATININE 0.66  --  1.19*  --   --   TROPONINIHS  --  14  --  13  --      Estimated Creatinine Clearance: 48.4 mL/min (A) (by C-G formula based on SCr of 1.19 mg/dL (H)).   Medical History: Past Medical History:  Diagnosis Date   Abnormal nuclear stress test 10/18/2013   Arm pain 10/18/2013   Arthritis    Cataract    RIGHT EYE   Chest pain 10/18/2013   Diabetes mellitus    dx over 5 yrs ago   GERD (gastroesophageal reflux disease)    Heart murmur    "yrs ago in [place] -- been here since 1999, "   History of hiatal hernia    Hypertension    Prolonged Q-T interval on ECG 04/20/2017   Pulmonary edema 04/20/2017    Assessment: 65 YOF presenting with abdominal pain, N/V, on Eliquis PTA for hx of DVT, last dose taken 1/5  Currently on IV heparin at 1150 units/hr. aPTT and HL not correlating due to effect of Eliquis. Will adjust based off aPTT which is subtherapeutic at 42s. RN reports no s/s of bleeding   Goal of Therapy:  Heparin level 0.3-0.7 units/ml aPTT 66-102  seconds Monitor platelets by anticoagulation protocol: Yes   Plan:  Increase heparin infusion to 1300 units/hr  F/u 8 hour aPTT Monitor daily HL, aPTT and s/s of bleeding   Albertina Parr, PharmD., BCPS, BCCCP Clinical Pharmacist Please refer to [facility] for unit-specific pharmacist

## 2021-11-07 NOTE — ED Notes (Signed)
Following suppository and enema administration, pt had 2 successful bowel movements. Pt endorses relief from discomfort. Returned to [place] [facility].

## 2021-11-07 NOTE — Progress Notes (Signed)
ANTICOAGULATION CONSULT NOTE - Initial Consult  Pharmacy Consult for heparin Indication: Hx DVT  Allergies  Allergen Reactions   Cephalexin Other (See Comments)    URINARY RETENTION = "blocked my urine"   Tapentadol     Other reaction(s): zombie Other reaction(s): zombie   Codeine Hives and Rash   Nucynta [Tapentadol Hcl] Other (See Comments)    Altered mental status    Patient Measurements:   Heparin Dosing Weight: 83kg  Vital Signs: BP: 142/79 (01/07 1100) Pulse Rate: 95 (01/07 1100)  Labs: Recent Labs    11/06/21 1109 11/07/21 0649 11/07/21 0713  HGB 14.9  --  12.1  HCT 47.1*  --  39.0  PLT 281  --  268  CREATININE 0.66  --  1.19*  TROPONINIHS  --  14  --     CrCl cannot be calculated (Unknown ideal weight.).   Medical History: Past Medical History:  Diagnosis Date   Abnormal nuclear stress test 10/18/2013   Arm pain 10/18/2013   Arthritis    Cataract    RIGHT EYE   Chest pain 10/18/2013   Diabetes mellitus    dx over 5 yrs ago   GERD (gastroesophageal reflux disease)    Heart murmur    "yrs ago in [place] -- been here since 1999, "   History of hiatal hernia    Hypertension    Prolonged Q-T interval on ECG 04/20/2017   Pulmonary edema 04/20/2017    Assessment: 65 YOF presenting with abdominal pain, N/V, on Eliquis PTA for hx of DVT, last dose taken 1/5  Goal of Therapy:  Heparin level 0.3-0.7 units/ml aPTT 66-102 seconds Monitor platelets by anticoagulation protocol: Yes   Plan:  Heparin gtt at 1150 units/hr, no bolus F/u 8 hour aPTT, heparin level  Bertis Ruddy, PharmD Clinical Pharmacist ED Pharmacist Phone # [phone] 11/07/2021 11:49 AM

## 2021-11-07 NOTE — ED Notes (Signed)
Pt temporarily moved from hall 21 to room 5 for suppository and enema administration.
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
## 2021-11-07 NOTE — ED Notes (Signed)
Ice water provided to pt

## 2021-11-08 ENCOUNTER — Observation Stay (HOSPITAL_COMMUNITY): Payer: Medicare Other

## 2021-11-08 DIAGNOSIS — K5730 Diverticulosis of large intestine without perforation or abscess without bleeding: Secondary | ICD-10-CM | POA: Diagnosis not present

## 2021-11-08 DIAGNOSIS — Z6830 Body mass index (BMI) 30.0-30.9, adult: Secondary | ICD-10-CM | POA: Diagnosis not present

## 2021-11-08 DIAGNOSIS — E876 Hypokalemia: Secondary | ICD-10-CM

## 2021-11-08 DIAGNOSIS — D509 Iron deficiency anemia, unspecified: Secondary | ICD-10-CM | POA: Diagnosis present

## 2021-11-08 DIAGNOSIS — E1140 Type 2 diabetes mellitus with diabetic neuropathy, unspecified: Secondary | ICD-10-CM | POA: Diagnosis present

## 2021-11-08 DIAGNOSIS — M069 Rheumatoid arthritis, unspecified: Secondary | ICD-10-CM | POA: Diagnosis present

## 2021-11-08 DIAGNOSIS — Z881 Allergy status to other antibiotic agents status: Secondary | ICD-10-CM | POA: Diagnosis not present

## 2021-11-08 DIAGNOSIS — Z885 Allergy status to narcotic agent status: Secondary | ICD-10-CM | POA: Diagnosis not present

## 2021-11-08 DIAGNOSIS — E872 Acidosis, unspecified: Secondary | ICD-10-CM | POA: Diagnosis present

## 2021-11-08 DIAGNOSIS — N179 Acute kidney failure, unspecified: Secondary | ICD-10-CM | POA: Diagnosis not present

## 2021-11-08 DIAGNOSIS — K5909 Other constipation: Secondary | ICD-10-CM | POA: Diagnosis present

## 2021-11-08 DIAGNOSIS — Z7901 Long term (current) use of anticoagulants: Secondary | ICD-10-CM | POA: Diagnosis not present

## 2021-11-08 DIAGNOSIS — E1151 Type 2 diabetes mellitus with diabetic peripheral angiopathy without gangrene: Secondary | ICD-10-CM | POA: Diagnosis present

## 2021-11-08 DIAGNOSIS — K5900 Constipation, unspecified: Secondary | ICD-10-CM

## 2021-11-08 DIAGNOSIS — K567 Ileus, unspecified: Secondary | ICD-10-CM | POA: Diagnosis not present

## 2021-11-08 DIAGNOSIS — R933 Abnormal findings on diagnostic imaging of other parts of digestive tract: Secondary | ICD-10-CM | POA: Diagnosis not present

## 2021-11-08 DIAGNOSIS — R1084 Generalized abdominal pain: Secondary | ICD-10-CM | POA: Diagnosis present

## 2021-11-08 DIAGNOSIS — K219 Gastro-esophageal reflux disease without esophagitis: Secondary | ICD-10-CM | POA: Diagnosis present

## 2021-11-08 DIAGNOSIS — Z20822 Contact with and (suspected) exposure to covid-19: Secondary | ICD-10-CM | POA: Diagnosis present

## 2021-11-08 DIAGNOSIS — D649 Anemia, unspecified: Secondary | ICD-10-CM

## 2021-11-08 DIAGNOSIS — E860 Dehydration: Secondary | ICD-10-CM | POA: Diagnosis present

## 2021-11-08 DIAGNOSIS — E119 Type 2 diabetes mellitus without complications: Secondary | ICD-10-CM

## 2021-11-08 DIAGNOSIS — I10 Essential (primary) hypertension: Secondary | ICD-10-CM | POA: Diagnosis present

## 2021-11-08 DIAGNOSIS — Z86718 Personal history of other venous thrombosis and embolism: Secondary | ICD-10-CM | POA: Diagnosis not present

## 2021-11-08 DIAGNOSIS — Z79899 Other long term (current) drug therapy: Secondary | ICD-10-CM | POA: Diagnosis not present

## 2021-11-08 DIAGNOSIS — R6510 Systemic inflammatory response syndrome (SIRS) of non-infectious origin without acute organ dysfunction: Secondary | ICD-10-CM | POA: Diagnosis present

## 2021-11-08 DIAGNOSIS — K635 Polyp of colon: Secondary | ICD-10-CM | POA: Diagnosis not present

## 2021-11-08 DIAGNOSIS — E785 Hyperlipidemia, unspecified: Secondary | ICD-10-CM

## 2021-11-08 DIAGNOSIS — I252 Old myocardial infarction: Secondary | ICD-10-CM | POA: Diagnosis not present

## 2021-11-08 DIAGNOSIS — Z888 Allergy status to other drugs, medicaments and biological substances status: Secondary | ICD-10-CM | POA: Diagnosis not present

## 2021-11-08 LAB — BASIC METABOLIC PANEL
Anion gap: 8 (ref 5–15)
BUN: 20 mg/dL (ref 8–23)
CO2: 21 mmol/L — ABNORMAL LOW (ref 22–32)
Calcium: 7.9 mg/dL — ABNORMAL LOW (ref 8.9–10.3)
Chloride: 112 mmol/L — ABNORMAL HIGH (ref 98–111)
Creatinine, Ser: 0.75 mg/dL (ref 0.44–1.00)
GFR, Estimated: >60 mL/min (ref >60)
Glucose, Bld: 101 mg/dL — ABNORMAL HIGH (ref 70–99)
Potassium: 3.3 mmol/L — ABNORMAL LOW (ref 3.5–5.1)
Sodium: 141 mmol/L (ref 135–145)

## 2021-11-08 LAB — APTT
aPTT: 61 seconds — ABNORMAL HIGH (ref 24–36)
aPTT: 63 seconds — ABNORMAL HIGH (ref 24–36)

## 2021-11-08 LAB — CBC
HCT: 32.4 % — ABNORMAL LOW (ref 36.0–46.0)
Hemoglobin: 9.8 g/dL — ABNORMAL LOW (ref 12.0–15.0)
MCH: 26.1 pg (ref 26.0–34.0)
MCHC: 30.2 g/dL (ref 30.0–36.0)
MCV: 86.4 fL (ref 80.0–100.0)
Platelets: 224 10*3/uL (ref 150–400)
RBC: 3.75 MIL/uL — ABNORMAL LOW (ref 3.87–5.11)
RDW: 20.9 % — ABNORMAL HIGH (ref 11.5–15.5)
WBC: 5.9 10*3/uL (ref 4.0–10.5)
nRBC: 0 % (ref 0.0–0.2)

## 2021-11-08 LAB — GLUCOSE, CAPILLARY
Glucose-Capillary: 111 mg/dL — ABNORMAL HIGH (ref 70–99)
Glucose-Capillary: 72 mg/dL (ref 70–99)
Glucose-Capillary: 86 mg/dL (ref 70–99)
Glucose-Capillary: 61 mg/dL — ABNORMAL LOW (ref 70–99)

## 2021-11-08 LAB — HEPARIN LEVEL (UNFRACTIONATED)
Heparin Unfractionated: >1.1 IU/mL — ABNORMAL HIGH (ref 0.30–0.70)
Heparin Unfractionated: 0.79 IU/mL — ABNORMAL HIGH (ref 0.30–0.70)

## 2021-11-08 IMAGING — DX DG ABD PORTABLE 1V
2 series · 2 of 2 positions shown · non-contrast
Comparison: Prior abdominal radiograph 11/07/2021

CLINICAL DATA: Constipation

EXAM:
PORTABLE ABDOMEN - 1 VIEW

[abdomen supine (1 of 2)]
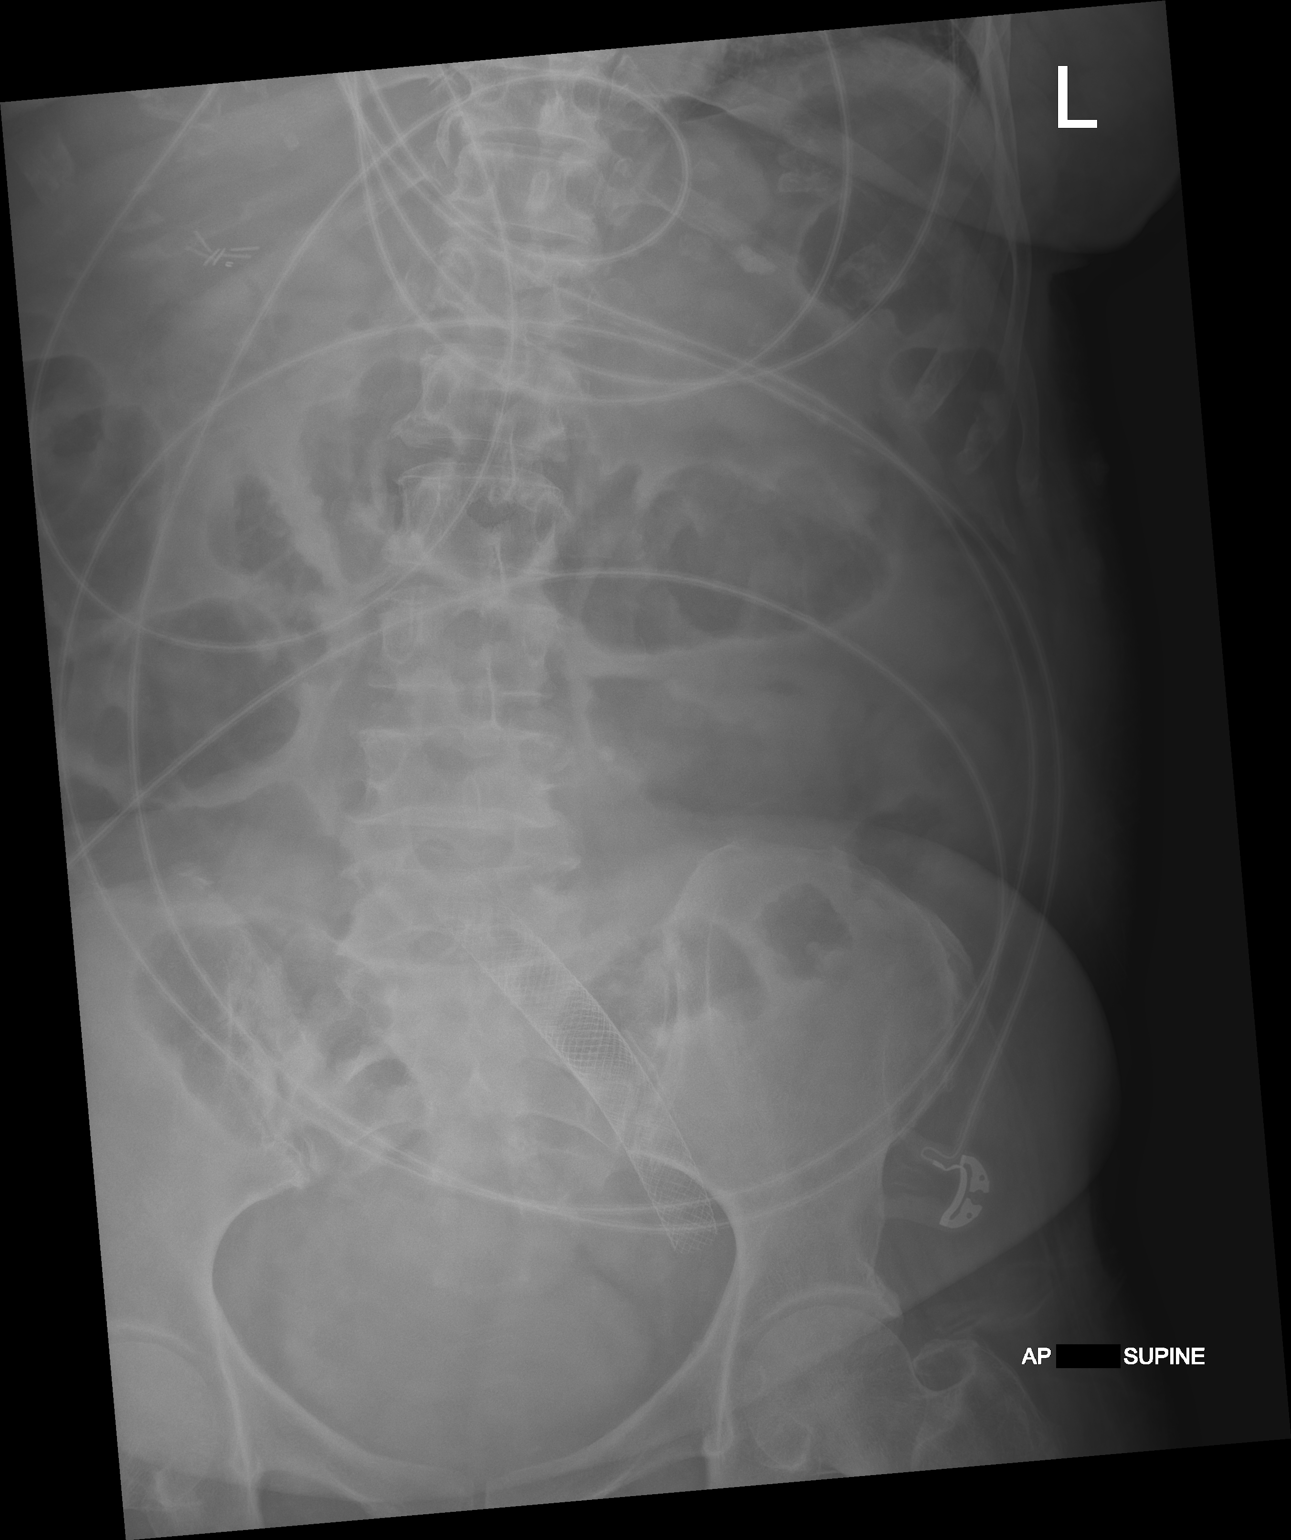

[abdomen supine (2 of 2)]
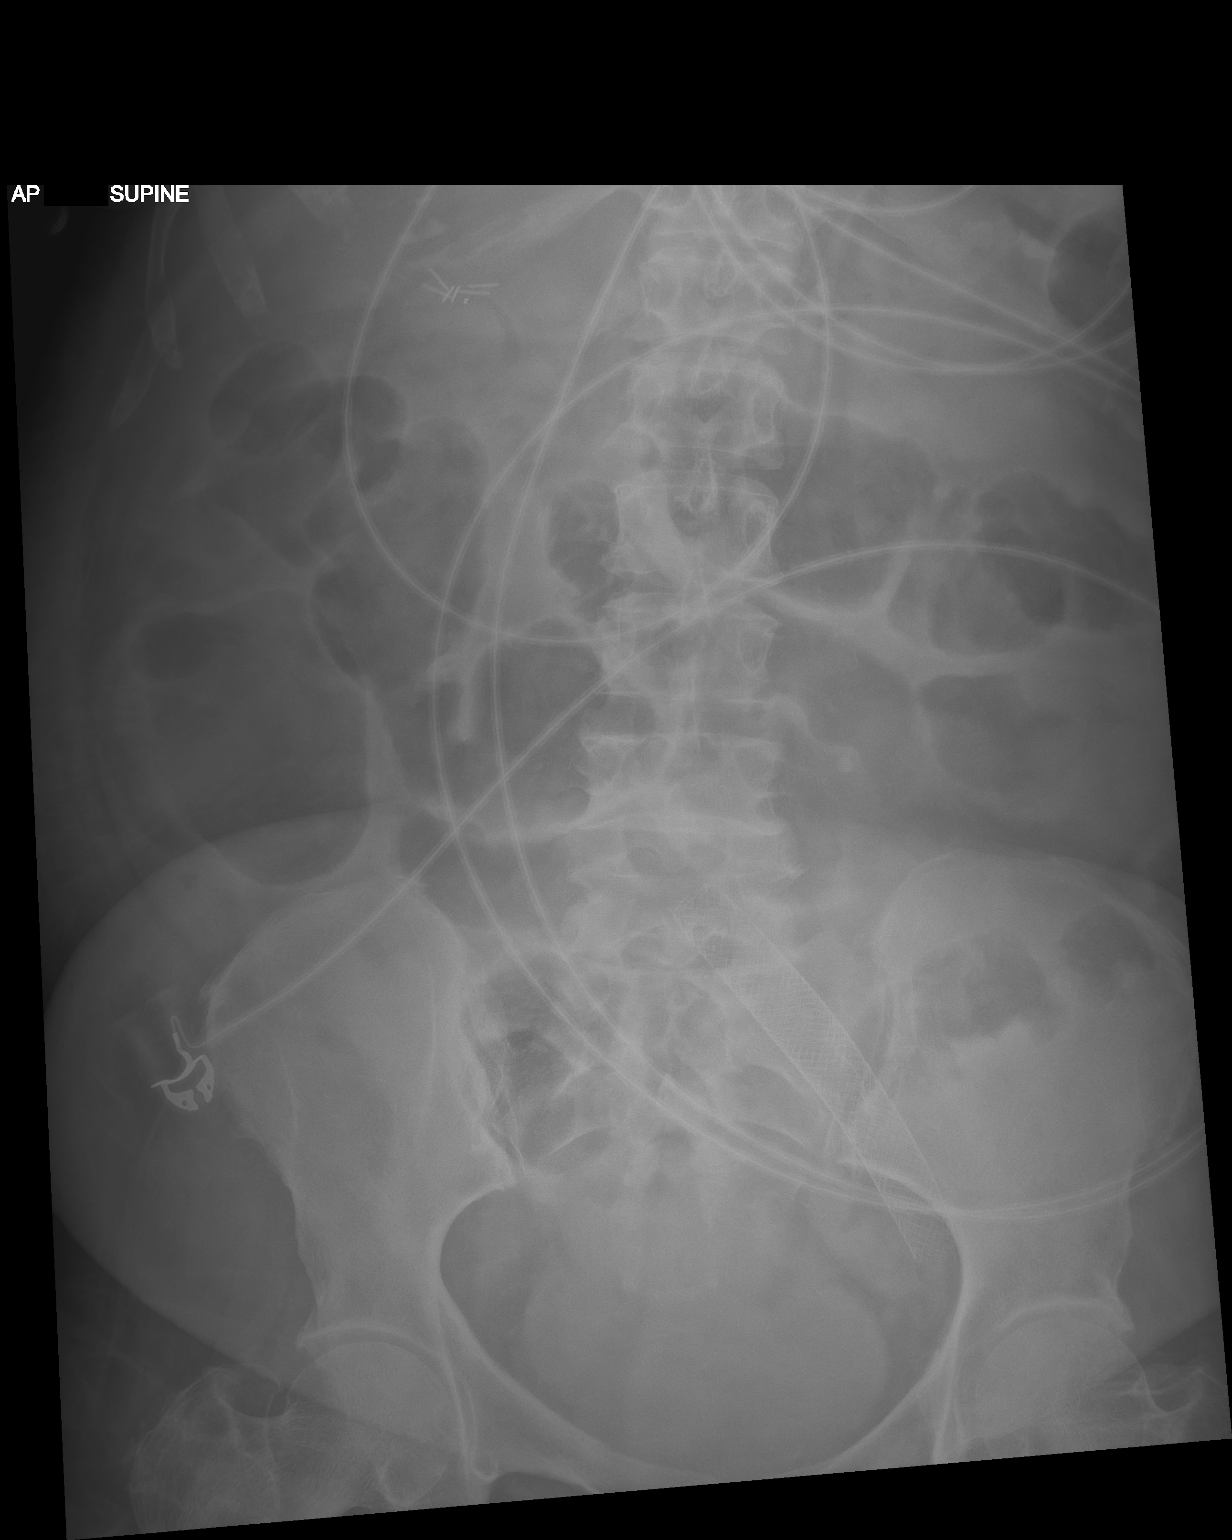

[2 of 2 positions shown; findings below may reference images not displayed]

FINDINGS: Significantly reduced colonic stool burden. Metallic stent projects
over the left common and external iliac veins. Surgical clips in the
right upper quadrant consistent with prior cholecystectomy. No
evidence of bowel obstruction. No acute osseous abnormality.
IMPRESSION: Significant interval reduction in colonic stool burden.

## 2021-11-08 MED ORDER — POLYETHYLENE GLYCOL 3350 17 GM/SCOOP PO POWD
1.0000 | Freq: Once | ORAL | Status: AC
  Administered 2021-11-08: 238 g via ORAL
  Filled 2021-11-08 (×2): qty 255

## 2021-11-08 MED ORDER — POTASSIUM CHLORIDE CRYS ER 20 MEQ PO TBCR
40.0000 meq | EXTENDED_RELEASE_TABLET | Freq: Once | ORAL | Status: AC
  Administered 2021-11-08: 40 meq via ORAL
  Filled 2021-11-08: qty 2

## 2021-11-08 MED ORDER — HEPARIN (PORCINE) 25000 UT/250ML-% IV SOLN
1350.0000 [IU]/h | INTRAVENOUS | Status: DC
  Administered 2021-11-08: 1400 [IU]/h via INTRAVENOUS
  Administered 2021-11-09 – 2021-11-10 (×2): 1350 [IU]/h via INTRAVENOUS
  Filled 2021-11-08 (×4): qty 250

## 2021-11-08 NOTE — Assessment & Plan Note (Addendum)
-  Last hemoglobin A1c was 7.8.  Continue home medications

## 2021-11-08 NOTE — Assessment & Plan Note (Addendum)
-  She is on chronic anticoagulation with Eliquis which is now on hold.  Continue heparin drip.  She has history of DVT distal of the IVC extending throughout left leg s/p thrombectomy on 02/06/2021 by Dr. Oneida Alar, and recurrence requiring repeat thrombectomy with balloon angioplasty 8/1 by Dr. Donzetta Matters -Resume aspirin and statin once able

## 2021-11-08 NOTE — Assessment & Plan Note (Addendum)
-   Likely due to dehydration in the setting of poor p.o. intake, resolved

## 2021-11-08 NOTE — Assessment & Plan Note (Addendum)
-  Continue methotrexate likely in outpatient setting, for now hold

## 2021-11-08 NOTE — Progress Notes (Signed)
ANTICOAGULATION CONSULT NOTE - Follow Up Consult  Pharmacy Consult for heparin Indication: Hx DVT  Allergies  Allergen Reactions   Cephalexin Other (See Comments)    URINARY RETENTION = "blocked my urine"   Tapentadol     Other reaction(s): zombie Other reaction(s): zombie   Codeine Hives and Rash   Nucynta [Tapentadol Hcl] Other (See Comments)    Altered mental status    Patient Measurements: Height: 5\' 8"  (172.7 cm) Weight: 92 kg (202 lb 13.2 oz) IBW/kg (Calculated) : 63.9 Heparin Dosing Weight: 83kg  Vital Signs: Temp: 99 F (37.2 C) (01/08 1316) Temp Source: Oral (01/08 1316) BP: 150/68 (01/08 2205) Pulse Rate: 72 (01/08 2205)  Labs: Recent Labs    11/06/21 1109 11/07/21 0649 11/07/21 0713 11/07/21 2152 11/07/21 2235 11/08/21 0534 11/08/21 0922 11/08/21 2128  HGB 14.9  --  12.1  --   --  9.8*  --   --   HCT 47.1*  --  39.0  --   --  32.4*  --   --   PLT 281  --  268  --   --  224  --   --   APTT  --   --   --   --  42*  --  61* 63*  HEPARINUNFRC  --   --   --   --  >1.10* >1.10*  --  0.79*  CREATININE 0.66  --  1.19*  --   --  0.75  --   --   TROPONINIHS  --  14  --  13  --   --   --   --      Estimated Creatinine Clearance: 72 mL/min (by C-G formula based on SCr of 0.75 mg/dL).   Medical History: Past Medical History:  Diagnosis Date   Abnormal nuclear stress test 10/18/2013   Arm pain 10/18/2013   Arthritis    Cataract    RIGHT EYE   Chest pain 10/18/2013   Diabetes mellitus    dx over 5 yrs ago   GERD (gastroesophageal reflux disease)    Heart murmur    "yrs ago in [place] -- been here since 1999, "   History of hiatal hernia    Hypertension    Prolonged Q-T interval on ECG 04/20/2017   Pulmonary edema 04/20/2017    Assessment: 76 YOF presenting with abdominal pain, N/V, on Eliquis PTA for hx of DVT, last dose taken 1/5.  APTT just below goal on recheck this evening at 63s. Heparin level still not correlating at 0.79. No bleeding or  IV issues noted.   Goal of Therapy:  Heparin level 0.3-0.7 units/ml aPTT 66-102 seconds Monitor platelets by anticoagulation protocol: Yes   Plan:  Increase heparin infusion to 1500 units/hr  Monitor daily HL, aPTT and s/s of bleeding   Erin Hearing PharmD., BCPS Clinical Pharmacist 11/08/2021 10:15 PM

## 2021-11-08 NOTE — Progress Notes (Signed)
ANTICOAGULATION CONSULT NOTE - Follow Up Consult  Pharmacy Consult for heparin Indication: Hx DVT  Allergies  Allergen Reactions   Cephalexin Other (See Comments)    URINARY RETENTION = "blocked my urine"   Tapentadol     Other reaction(s): zombie Other reaction(s): zombie   Codeine Hives and Rash   Nucynta [Tapentadol Hcl] Other (See Comments)    Altered mental status    Patient Measurements: Height: 5\' 8"  (172.7 cm) Weight: 92 kg (202 lb 13.2 oz) IBW/kg (Calculated) : 63.9 Heparin Dosing Weight: 83kg  Vital Signs: Temp: 98.9 F (37.2 C) (01/08 0416) Temp Source: Axillary (01/08 0416) BP: 121/60 (01/08 0416) Pulse Rate: 90 (01/08 0416)  Labs: Recent Labs    11/06/21 1109 11/07/21 0649 11/07/21 0713 11/07/21 2152 11/07/21 2235 11/08/21 0534 11/08/21 0922  HGB 14.9  --  12.1  --   --  9.8*  --   HCT 47.1*  --  39.0  --   --  32.4*  --   PLT 281  --  268  --   --  224  --   APTT  --   --   --   --  42*  --  61*  HEPARINUNFRC  --   --   --   --  >1.10* >1.10*  --   CREATININE 0.66  --  1.19*  --   --  0.75  --   TROPONINIHS  --  14  --  13  --   --   --      Estimated Creatinine Clearance: 72 mL/min (by C-G formula based on SCr of 0.75 mg/dL).   Medical History: Past Medical History:  Diagnosis Date   Abnormal nuclear stress test 10/18/2013   Arm pain 10/18/2013   Arthritis    Cataract    RIGHT EYE   Chest pain 10/18/2013   Diabetes mellitus    dx over 5 yrs ago   GERD (gastroesophageal reflux disease)    Heart murmur    "yrs ago in [place] -- been here since 1999, "   History of hiatal hernia    Hypertension    Prolonged Q-T interval on ECG 04/20/2017   Pulmonary edema 04/20/2017    Assessment: 82 YOF presenting with abdominal pain, N/V, on Eliquis PTA for hx of DVT, last dose taken 1/5.  APTT subtherapeutic 1/8 AM at 61 with heparin running at 1,300 units/h. Hgb decreased 12.1>9.8 (1/7>1/8), PLT stable/WNL. Patient is also on NS infusion, so  HGB drop may be dilutional. RN reports no s/s of bleeding. Increase heparin and check heparin level/APTT in 8h as APTT is trending up.   Goal of Therapy:  Heparin level 0.3-0.7 units/ml aPTT 66-102 seconds Monitor platelets by anticoagulation protocol: Yes   Plan:  Increase heparin infusion to 1400 units/hr  F/u 8 hour aPTT/HL Monitor daily HL, aPTT and s/s of bleeding   Adria Dill, PharmD PGY-1 Acute Care Resident  11/08/2021 10:14 AM

## 2021-11-08 NOTE — Care Management Obs Status (Signed)
[place] NOTIFICATION   Patient Details  Name: Diane [name] MRN: [id number] Date of Birth: 1946/08/29   Medicare Observation Status Notification Given:  Yes    Bartholomew Crews, RN 11/08/2021, 8:26 AM

## 2021-11-08 NOTE — Progress Notes (Signed)
Weekend coverage for Dr. Collene Mares Progress Note   Subjective  Chief Complaint: Constipation and small bowel abnormality  Today, the patient tells me that she does feel slightly better as she produced a lot of stool after 3 enemas yesterday.  She still has some generalized abdominal tenderness and does not feel like she is passing much gas.  Tells me she has had no further episodes of vomiting and does not really feel nauseous at this moment, but has not tried to eat anything.   Objective   Vital signs in last 24 hours: Temp:  [98.1 F (36.7 C)-98.9 F (37.2 C)] 98.9 F (37.2 C) (01/08 0416) Pulse Rate:  [90-95] 90 (01/08 0416) Resp:  [14-23] 14 (01/08 0416) BP: (117-143)/(60-79) 121/60 (01/08 0416) SpO2:  [92 %-99 %] 92 % (01/08 0416) Weight:  [92 kg] 92 kg (01/07 1800) Last BM Date: 11/07/21 General:   Overweight African-American female in NAD Heart:  Regular rate and rhythm; no murmurs Lungs: Respirations even and unlabored, lungs CTA bilaterally Abdomen:  Soft, mild generalized TTP (decreased from yesterday) and moderate distention, decreased bowel sounds all 4 quadrants Psych:  Cooperative. Normal mood and affect.  Intake/Output from previous day: 01/07 0701 - 01/08 0700 In: 1493.2 [I.V.:1493.2] Out: -    Lab Results: Recent Labs    11/06/21 1109 11/07/21 0713 11/08/21 0534  WBC 12.4* 6.8 5.9  HGB 14.9 12.1 9.8*  HCT 47.1* 39.0 32.4*  PLT 281 268 224   BMET Recent Labs    11/06/21 1109 11/07/21 0713 11/08/21 0534  NA 137 138 141  K 3.9 3.7 3.3*  CL 104 104 112*  CO2 20* 23 21*  GLUCOSE 228* 168* 101*  BUN 11 30* 20  CREATININE 0.66 1.19* 0.75  CALCIUM 9.2 8.5* 7.9*   LFT Recent Labs    11/06/21 1109  PROT 8.1  ALBUMIN 3.8  AST 17  ALT 18  ALKPHOS 72  BILITOT 0.9    Studies/Results: DG Abd 1 View  Result Date: 11/07/2021 CLINICAL DATA:  Abdominal pain and vomiting. EXAM: ABDOMEN - 1 VIEW COMPARISON:  Scout film from CT scan 04/06/2022.  X-rays from 06/03/2021 FINDINGS: Diffuse gaseous small bowel distension noted, mildly decreased compared to scout film from yesterday's CT. Stool-filled loops of small bowel and/or colon noted in the left abdomen. Left femoral vascular stent device noted. Surgical clips noted right upper quadrant. IMPRESSION: Diffuse gaseous distention of small bowel is mildly decreased compared to scout film from yesterday's CT. Electronically Signed   By: Misty Stanley M.D.   On: 11/07/2021 13:40   CT ABDOMEN PELVIS W CONTRAST  Result Date: 11/06/2021 CLINICAL DATA:  Generalized abdominal pain, vomiting EXAM: CT ABDOMEN AND PELVIS WITH CONTRAST TECHNIQUE: Multidetector CT imaging of the abdomen and pelvis was performed using the standard protocol following bolus administration of intravenous contrast. CONTRAST:  161mL OMNIPAQUE IOHEXOL 300 MG/ML  SOLN COMPARISON:  09/14/2021 FINDINGS: Lower chest: No acute pleural or parenchymal lung disease. Hepatobiliary: Mild diffuse fatty infiltration of the liver. No focal hepatic abnormalities. Gallbladder is surgically absent. Pancreas: Unremarkable. No pancreatic ductal dilatation or surrounding inflammatory changes. Spleen: Normal in size without focal abnormality. Adrenals/Urinary Tract: Adrenal glands are unremarkable. Kidneys are normal, without renal calculi, focal lesion, or hydronephrosis. Bladder is unremarkable. Stomach/Bowel: The stomach, duodenum, and proximal jejunum are decompressed. Within the right lower quadrant, there is change in caliber at the level of the distal jejunum, with transition point identified image 56/3. The distal jejunum and ileum are markedly  dilated, with formed stool identified within the ileum. Maximal caliber of the small bowel measures 5.2 cm. Gas and stool are seen throughout the colon, with moderate fecal retention again noted. I do not see a source for colonic obstruction. No bowel wall thickening.  No inflammatory changes. Vascular/Lymphatic:  Minimal atherosclerosis. Stent extending from the left external iliac through the left common iliac veins unchanged. No pathologic adenopathy. Reproductive: Status post hysterectomy. No adnexal masses. Other: Small amount of ascites throughout the abdomen and pelvis, slightly increased since prior study. No free intraperitoneal gas. No abdominal wall hernia. Musculoskeletal: No acute or destructive bony lesions. Reconstructed images demonstrate no additional findings. IMPRESSION: 1. Dilated distal small bowel involving the distal jejunum through the ileum. Formed stool within the small bowel, with moderate fecal retention throughout the colon. I do not see an anatomic source for distal obstruction, and this could reflect functional obstruction due to colonic stasis and constipation. 2. Small volume ascites, increased since prior. 3.  Aortic Atherosclerosis (ICD10-I70.0). 4. Mild hepatic steatosis. Electronically Signed   By: Randa Ngo M.D.   On: 11/06/2021 15:11   DG Abd Portable 1V  Result Date: 11/08/2021 CLINICAL DATA:  Constipation EXAM: PORTABLE ABDOMEN - 1 VIEW COMPARISON:  Prior abdominal radiograph 11/07/2021 FINDINGS: Significantly reduced colonic stool burden. Metallic stent projects over the left common and external iliac veins. Surgical clips in the right upper quadrant consistent with prior cholecystectomy. No evidence of bowel obstruction. No acute osseous abnormality. IMPRESSION: Significant interval reduction in colonic stool burden. Electronically Signed   By: Jacqulynn Cadet M.D.   On: 11/08/2021 07:08      Assessment / Plan:   Assessment: 1.  Severe constipation: With nausea and vomiting as well as generalized abdominal pain, CT showing dilated distal small bowel and stool throughout the colon, last colonoscopy attempted by Dr. Collene Mares 3/22 was aborted due to poor prep, previous CTs noted concern for possible slow transit versus partial obstruction/mass at the level of the ileocecal  valve, patient improved overnight after 3 smog enemas, but remains distended with decreased bowel sounds; consider functional constipation with associated obstructive symptoms versus lesion at the region of the ileocecal valve (i.e. cancer) 2.  Lactic acidosis 3.  Diabetes type 2 4.  History of DVT on chronic anticoagulation: She has been switched to IV heparin 5.  History of SBO 6.  RA on methotrexate 7.  GERD  Plan: 1.  Reviewed recent abdominal x-ray with the patient, thankfully this shows a significant interval reduction in colonic stool burden and no evidence of obstruction.  Patient is still moderately distended on exam with some mild tenderness and decreased bowel sounds.  At this point discussed starting a MiraLAX bowel prep with her to sip on over the day.  Explained that Dr. Collene Mares and Dr. Benson Norway will take over tomorrow and will likely want to give her another bowel prep and possibly plan for colonoscopy on Tuesday. 2.  Patient can be on clears today 3.  Patient will need her heparin to be held for procedures if planned in the future  Thank you for your kind consultation, Drs. Collene Mares and Benson Norway will take over tomorrow.   LOS: 0 days   Levin Erp  11/08/2021, 9:43 AM

## 2021-11-08 NOTE — Hospital Course (Signed)
76 year old female with HTN, CAD with prior non-STEMI, left DVT status post mechanical thrombectomy August 2022 on chronic anticoagulation, DM2, history of SBO, chronic constipation comes in with abdominal pain, nausea or vomiting.  Symptoms started 2 days prior to admission.  She has been nauseous and unable to any food or liquids down.  A prior CT was concerning for partial obstruction at the level of the ileocecal valve.  Colonoscopy last year was unable to be fully completed due to poor prep.  GI was consulted in the ED and she was admitted to the hospital.

## 2021-11-08 NOTE — Assessment & Plan Note (Addendum)
-  Patient was admitted to the hospital with generalized abdominal pain, nausea, vomiting in the setting of functional obstruction due to severe constipation.  Prior imaging raise concerns about partial obstruction at the ileocecal valve.  GI consulted.  She has received several enemas, has been placed on aggressive bowel regimen with improvement in her symptoms.  She underwent a colonoscopy on 1/10 which showed four 3 to 4 mm polyps in the cecum, removed with a cold snare.  It also showed diverticulosis in the entire colon.  She recovered well post procedurally, feeling better, discussed case with Dr. Benson Norway with GI and she will be discharged home in stable condition.  She can resume anticoagulation on discharge.  She will follow-up as an outpatient with Dr. Collene Mares in follow-up on on polyp pathology

## 2021-11-08 NOTE — Progress Notes (Signed)
PROGRESS NOTE  Diane [name] ZOX:[id number] DOB: Feb 11, 1946 DOA: 11/06/2021 PCP: [name], MD   LOS: 0 days   Brief Narrative / Interim history: 76 year old female with HTN, CAD with prior non-STEMI, left DVT status post mechanical thrombectomy August 2022 on chronic anticoagulation, DM2, history of SBO, chronic constipation comes in with abdominal pain, nausea or vomiting.  Symptoms started 2 days prior to admission.  She has been nauseous and unable to any food or liquids down.  A prior CT was concerning for partial obstruction at the level of the ileocecal valve.  Colonoscopy last year was unable to be fully completed due to poor prep.  GI was consulted in the ED and she was admitted to the hospital.  Subjective / 24h Interval events: Complains of persistent abdominal pain.  She has had another bowel movement.  Abdominal pain is throughout.  She is not hungry but wants to try some liquids  Assessment & Plan: * Constipation- (present on admission) -Patient was admitted to the hospital with generalized abdominal pain, nausea, vomiting in the setting of functional obstruction due to severe constipation.  Prior imaging raise concerns about partial obstruction at the ileocecal valve.  GI consulted.  She has received several enemas yesterday in the ED and she has been placed on aggressive bowel regimen with some improvement in her symptoms.  Abdominal x-ray this morning shows improvement.  Remains symptomatic, discussed with GI this morning attempted clear liquid diet and continue slow bowel prep for potential colonoscopy in the next 1 to 2 days.   Type 2 diabetes mellitus with diabetic neuropathy, unspecified ([place])- (present on admission) -Last hemoglobin A1c was 7.8.  Has been placed on sliding scale, continue  CBG (last 3)  Recent Labs    11/07/21 1606 11/07/21 2134 11/08/21 0756  GLUCAP 133* 103* 111*    Hypertension- (present on admission) -Home blood pressure regimen  includes amlodipine 5 mg daily, furosemide 20 mg daily, losartan 100 mg daily, and telmisartan 80 mg -Currently normotensive, monitor, if she tolerates p.o. intake and becomes hypertensive I will resume  AKI (acute kidney injury) ([place])- (present on admission) - Likely due to dehydration in the setting of poor p.o. intake, improved with fluids  Hyperlipidemia, unspecified - Hold statin, resume if she takes p.o. intake consistent  GERD (gastroesophageal reflux disease)- (present on admission) -Continue protonix  SIRS (systemic inflammatory response syndrome) ([place])- (present on admission) - Do not suspect an infectious source, improved  History of DVT (deep vein thrombosis) -She is on chronic anticoagulation with Eliquis which is now on hold.  Continue heparin drip.  She has history of DVT distal of the IVC extending throughout left leg s/p thrombectomy on 02/06/2021 by Dr. Oneida Alar, and recurrence requiring repeat thrombectomy with balloon angioplasty 8/1 by Dr. Donzetta Matters -Resume aspirin and statin once able  Anemia - Hemoglobin stable, no bleeding, possibly dilutional component given IV fluids.  Continue to monitor  Rheumatoid arthritis ([place])- (present on admission) -Continue methotrexate likely in outpatient setting, for now hold   Scheduled Meds:  insulin aspart  0-9 Units Subcutaneous TID WC   pantoprazole (PROTONIX) IV  40 mg Intravenous Q12H   senna-docusate  1 tablet Oral BID   sodium chloride flush  3 mL Intravenous Q12H   Continuous Infusions:  sodium chloride 75 mL/hr at 11/07/21 2202   heparin 1,400 Units/hr (11/08/21 1050)   PRN Meds:.acetaminophen **OR** acetaminophen, albuterol, simethicone  Diet Orders (From admission, onward)     Start     Ordered  11/08/21 0950  Diet clear liquid Room service appropriate? Yes; Fluid consistency: Thin  Diet effective now       Question Answer Comment  Room service appropriate? Yes   Fluid consistency: Thin      11/08/21 0950             DVT prophylaxis: heparin infusion  Lab Results  Component Value Date   PLT 224 11/08/2021      Code Status: Full Code  Family Communication: No family at bedside  Status is: Observation  The patient will require care spanning > 2 midnights and should be moved to inpatient because: Persistent symptoms, inconsistent p.o. intake, plans for inpatient colonoscopy  Level of care: Telemetry Medical  Consultants:  GI  Procedures:  none  Microbiology  none  Antimicrobials: none    Objective: Vitals:   11/07/21 1735 11/07/21 1800 11/07/21 2000 11/08/21 0416  BP:  137/72 126/62 121/60  Pulse:   95 90  Resp:  20 17 14   Temp:   98.1 F (36.7 C) 98.9 F (37.2 C)  TempSrc:   Oral Axillary  SpO2: 98% 99% 99% 92%  Weight:  92 kg    Height:  5\' 8"  (1.727 m)      Intake/Output Summary (Last 24 hours) at 11/08/2021 1055 Last data filed at 11/08/2021 0300 Gross per 24 hour  Intake 1493.17 ml  Output --  Net 1493.17 ml   Wt Readings from Last 3 Encounters:  11/07/21 92 kg  10/06/21 91.7 kg  09/19/21 92.1 kg    Examination:  Constitutional: NAD Eyes: no scleral icterus ENMT: Mucous membranes are moist.  Neck: normal, supple Respiratory: clear to auscultation bilaterally, no wheezing, no crackles. Normal respiratory effort.  Cardiovascular: Regular rate and rhythm, 3/6 SEM. No LE edema.  Abdomen: Diffusely tender to palpation, no guarding or rebound.  Bowel sounds positive Musculoskeletal: no clubbing / cyanosis.  Skin: No rashes seen Neurologic: nonfocal Psychiatric: Normal judgment and insight. Alert and oriented x 3. Normal mood.    Data Reviewed: I have independently reviewed following labs and imaging studies  CBC Recent Labs  Lab 11/06/21 1109 11/07/21 0713 11/08/21 0534  WBC 12.4* 6.8 5.9  HGB 14.9 12.1 9.8*  HCT 47.1* 39.0 32.4*  PLT 281 268 224  MCV 84.7 84.2 86.4  MCH 26.8 26.1 26.1  MCHC 31.6 31.0 30.2  RDW 21.6* 21.1* 20.9*     Recent Labs  Lab 11/06/21 1109 11/07/21 0315 11/07/21 0525 11/07/21 0649 11/07/21 0713 11/07/21 0810 11/08/21 0534  NA 137  --   --   --  138  --  141  K 3.9  --   --   --  3.7  --  3.3*  CL 104  --   --   --  104  --  112*  CO2 20*  --   --   --  23  --  21*  GLUCOSE 228*  --   --   --  168*  --  101*  BUN 11  --   --   --  30*  --  20  CREATININE 0.66  --   --   --  1.19*  --  0.75  CALCIUM 9.2  --   --   --  8.5*  --  7.9*  AST 17  --   --   --   --   --   --   ALT 18  --   --   --   --   --   --  ALKPHOS 72  --   --   --   --   --   --   BILITOT 0.9  --   --   --   --   --   --   ALBUMIN 3.8  --   --   --   --   --   --   MG  --   --   --  1.9  --   --   --   LATICACIDVEN  --  2.0* 1.7  --   --   --   --   HGBA1C  --   --   --   --   --  6.5*  --     ------------------------------------------------------------------------------------------------------------------ No results for input(s): CHOL, HDL, LDLCALC, TRIG, CHOLHDL, LDLDIRECT in the last 72 hours.  Lab Results  Component Value Date   HGBA1C 6.5 (H) 11/07/2021   ------------------------------------------------------------------------------------------------------------------ No results for input(s): TSH, T4TOTAL, T3FREE, THYROIDAB in the last 72 hours.  Invalid input(s): FREET3  Cardiac Enzymes No results for input(s): CKMB, TROPONINI, MYOGLOBIN in the last 168 hours.  Invalid input(s): CK ------------------------------------------------------------------------------------------------------------------    Component Value Date/Time   BNP 51.6 07/02/2021 1255    `CBG: Recent Labs  Lab 11/07/21 0810 11/07/21 1158 11/07/21 1606 11/07/21 2134 11/08/21 0756  GLUCAP 166* 138* 133* 103* 111*    Recent Results (from the past 240 hour(s))  Resp Panel by RT-PCR (Flu A&B, Covid) Nasopharyngeal Swab     Status: None   Collection Time: 11/07/21 12:05 AM   Specimen: Nasopharyngeal Swab;  Nasopharyngeal(NP) swabs in vial transport medium  Result Value Ref Range Status   SARS Coronavirus 2 by RT PCR NEGATIVE NEGATIVE Final    Comment: (NOTE) SARS-CoV-2 target nucleic acids are NOT DETECTED.  The SARS-CoV-2 RNA is generally detectable in upper respiratory specimens during the acute phase of infection. The lowest concentration of SARS-CoV-2 viral copies this assay can detect is 138 copies/mL. A negative result does not preclude SARS-Cov-2 infection and should not be used as the sole basis for treatment or other patient management decisions. A negative result may occur with  improper specimen collection/handling, submission of specimen other than nasopharyngeal swab, presence of viral mutation(s) within the areas targeted by this assay, and inadequate number of viral copies(<138 copies/mL). A negative result must be combined with clinical observations, patient history, and epidemiological information. The expected result is Negative.  Fact Sheet for Patients:  EntrepreneurPulse.com.au  Fact Sheet for Healthcare Providers:  IncredibleEmployment.be  This test is no t yet approved or cleared by the [place] FDA and  has been authorized for detection and/or diagnosis of SARS-CoV-2 by FDA under an Emergency Use Authorization (EUA). This EUA will remain  in effect (meaning this test can be used) for the duration of the COVID-19 declaration under Section 564(b)(1) of the Act, 21 U.S.C.section 360bbb-3(b)(1), unless the authorization is terminated  or revoked sooner.       Influenza A by PCR NEGATIVE NEGATIVE Final   Influenza B by PCR NEGATIVE NEGATIVE Final    Comment: (NOTE) The Xpert Xpress SARS-CoV-2/FLU/RSV plus assay is intended as an aid in the diagnosis of influenza from Nasopharyngeal swab specimens and should not be used as a sole basis for treatment. Nasal washings and aspirates are unacceptable for Xpert Xpress  SARS-CoV-2/FLU/RSV testing.  Fact Sheet for Patients: EntrepreneurPulse.com.au  Fact Sheet for Healthcare Providers: IncredibleEmployment.be  This test is not yet approved or cleared by the [place] and  has been authorized for detection and/or diagnosis of SARS-CoV-2 by FDA under an Emergency Use Authorization (EUA). This EUA will remain in effect (meaning this test can be used) for the duration of the COVID-19 declaration under Section 564(b)(1) of the Act, 21 U.S.C. section 360bbb-3(b)(1), unless the authorization is terminated or revoked.  Performed at [facility], [place] [address], [place], [place] [address]      Radiology Studies: DG Abd 1 View  Result Date: 11/07/2021 CLINICAL DATA:  Abdominal pain and vomiting. EXAM: ABDOMEN - 1 VIEW COMPARISON:  Scout film from CT scan 04/06/2022. X-rays from 06/03/2021 FINDINGS: Diffuse gaseous small bowel distension noted, mildly decreased compared to scout film from yesterday's CT. Stool-filled loops of small bowel and/or colon noted in the left abdomen. Left femoral vascular stent device noted. Surgical clips noted right upper quadrant. IMPRESSION: Diffuse gaseous distention of small bowel is mildly decreased compared to scout film from yesterday's CT. Electronically Signed   By: Misty Stanley M.D.   On: 11/07/2021 13:40   DG Abd Portable 1V  Result Date: 11/08/2021 CLINICAL DATA:  Constipation EXAM: PORTABLE ABDOMEN - 1 VIEW COMPARISON:  Prior abdominal radiograph 11/07/2021 FINDINGS: Significantly reduced colonic stool burden. Metallic stent projects over the left common and external iliac veins. Surgical clips in the right upper quadrant consistent with prior cholecystectomy. No evidence of bowel obstruction. No acute osseous abnormality. IMPRESSION: Significant interval reduction in colonic stool burden. Electronically Signed   By: Jacqulynn Cadet M.D.   On: 11/08/2021 07:08     Marzetta Board, MD, PhD [facility]  Between 7 am - 7 pm I am available, please contact me via Amion (for emergencies) or Securechat (non urgent messages)  Between 7 pm - 7 am I am not available, please contact night coverage MD/APP via Amion

## 2021-11-08 NOTE — TOC Initial Note (Signed)
Transition of Care [facility]) - Initial/Assessment Note    Patient Details  Name: Diane [name] MRN: [id number] Date of Birth: 05-05-1946  Transition of Care [facility]) CM/SW Contact:    Bartholomew Crews, RN Phone Number: [phone] 11/08/2021, 8:27 AM  Clinical Narrative:                  Spoke with patient at the bedside. PTA home alone. Independent. No active [place] services. Has DME at home to include RW, rollator, and tub bench. Demographics and PCP verified. Has transportation at discharge. TOC following for transition needs.   Expected Discharge Plan: Home/Self Care Barriers to Discharge: Continued Medical Work up   Patient Goals and CMS Choice Patient states their goals for this hospitalization and ongoing recovery are:: return home CMS Medicare.gov Compare Post Acute Care list provided to:: Patient Choice offered to / list presented to : NA  Expected Discharge Plan and Services Expected Discharge Plan: Home/Self Care In-house Referral: NA Discharge Planning Services: CM Consult   Living arrangements for the past 2 months: Single Family Home                 DME Arranged: N/A DME Agency: NA       HH Arranged: NA HH Agency: NA        Prior Living Arrangements/Services Living arrangements for the past 2 months: Single Family Home Lives with:: Self Patient language and need for interpreter reviewed:: Yes Do you feel safe going back to the place where you live?: Yes      Need for Family Participation in Patient Care: No (Comment)   Current home services: DME (RW, rollator, tub bench) Criminal Activity/Legal Involvement Pertinent to Current Situation/Hospitalization: No - Comment as needed  Activities of Daily Living Home Assistive Devices/Equipment: None ADL Screening (condition at time of admission) Patient's cognitive ability adequate to safely complete daily activities?: Yes Is the patient deaf or have difficulty hearing?: No Does the patient have difficulty seeing, even  when wearing glasses/contacts?: No Does the patient have difficulty concentrating, remembering, or making decisions?: No Patient able to express need for assistance with ADLs?: No Does the patient have difficulty dressing or bathing?: No Independently performs ADLs?: Yes (appropriate for developmental age) Does the patient have difficulty walking or climbing stairs?: No Weakness of Legs: None Weakness of Arms/Hands: None  Permission Sought/Granted                  Emotional Assessment Appearance:: Appears stated age Attitude/Demeanor/Rapport: Engaged Affect (typically observed): Accepting Orientation: : Oriented to Self, Oriented to Place, Oriented to  Time, Oriented to Situation Alcohol / Substance Use: Not Applicable Psych Involvement: No (comment)  Admission diagnosis:  Generalized abdominal pain [R10.84] Constipation [K59.00] Abdominal pain [R10.9] Constipation, unspecified constipation type [K59.00] Nausea and vomiting, unspecified vomiting type [R11.2] Patient Active Problem List   Diagnosis Date Noted   Diabetes mellitus, poorly controlled, with hyperglycemia ([place]) 11/08/2021   Hyperlipidemia, unspecified 11/08/2021   History of DVT (deep vein thrombosis) 11/07/2021   SIRS (systemic inflammatory response syndrome) ([place]) 11/07/2021   Lactic acidosis 11/07/2021   GERD (gastroesophageal reflux disease) 11/07/2021   Left leg DVT ([place]) 05/30/2021   Normochromic normocytic anemia 02/12/2021   Constipation    Fever    Acute DVT (deep venous thrombosis) ([place]) 02/04/2021   AKI (acute kidney injury) ([place]) 01/23/2021   Rheumatoid arthritis ([place]) 01/23/2021   SBO (small bowel obstruction) ([place]) 01/22/2021   Postoperative anemia due to acute blood loss 06/18/2018  Type 2 diabetes mellitus with diabetic neuropathy, unspecified ([place]) 06/18/2018   Peripheral neuropathy 06/18/2018   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 [place] [address] Phone: [phone] Fax: [phone]     Social Determinants of Health (SDOH) Interventions    Readmission Risk Interventions Readmission Risk Prevention Plan 07/15/2021  Transportation Screening Complete  Medication Review Press photographer) Complete  PCP or Specialist appointment within 3-5 days of discharge Complete  HRI or [place] Complete  SW Recovery Care/Counseling Consult Complete  [place] Not Applicable  Some recent data might be hidden

## 2021-11-08 NOTE — Assessment & Plan Note (Addendum)
-   Do not suspect an infectious source, improved

## 2021-11-08 NOTE — Assessment & Plan Note (Addendum)
-   Continue to hold statin

## 2021-11-08 NOTE — Plan of Care (Signed)
°  Problem: Education: Goal: Knowledge of General Education information will improve Description: Including pain rating scale, medication(s)/side effects and non-pharmacologic comfort measures Outcome: Progressing   Problem: Nutrition: Goal: Adequate nutrition will be maintained Outcome: Progressing   Problem: Elimination: Goal: Will not experience complications related to bowel motility Outcome: Progressing   Problem: Pain Managment: Goal: General experience of comfort will improve Outcome: Progressing   Problem: Safety: Goal: Ability to remain free from injury will improve Outcome: Progressing   Problem: Skin Integrity: Goal: Risk for impaired skin integrity will decrease Outcome: Progressing

## 2021-11-08 NOTE — Assessment & Plan Note (Addendum)
-   Hemoglobin stable, no bleeding, possibly dilutional component given IV fluids.  Stable today, no bleeding

## 2021-11-08 NOTE — Assessment & Plan Note (Addendum)
Continue protonix  

## 2021-11-08 NOTE — Assessment & Plan Note (Addendum)
-   Continue home medications 

## 2021-11-09 ENCOUNTER — Encounter (HOSPITAL_COMMUNITY): Payer: Self-pay | Admitting: Internal Medicine

## 2021-11-09 LAB — COMPREHENSIVE METABOLIC PANEL
ALT: 10 U/L (ref 0–44)
AST: 12 U/L — ABNORMAL LOW (ref 15–41)
Albumin: 2.6 g/dL — ABNORMAL LOW (ref 3.5–5.0)
Alkaline Phosphatase: 46 U/L (ref 38–126)
Anion gap: 6 (ref 5–15)
BUN: 7 mg/dL — ABNORMAL LOW (ref 8–23)
CO2: 24 mmol/L (ref 22–32)
Calcium: 8.1 mg/dL — ABNORMAL LOW (ref 8.9–10.3)
Chloride: 109 mmol/L (ref 98–111)
Creatinine, Ser: 0.55 mg/dL (ref 0.44–1.00)
GFR, Estimated: >60 mL/min (ref >60)
Glucose, Bld: 96 mg/dL (ref 70–99)
Potassium: 3.2 mmol/L — ABNORMAL LOW (ref 3.5–5.1)
Sodium: 139 mmol/L (ref 135–145)
Total Bilirubin: 0.7 mg/dL (ref 0.3–1.2)
Total Protein: 5.5 g/dL — ABNORMAL LOW (ref 6.5–8.1)

## 2021-11-09 LAB — CBC
HCT: 31.7 % — ABNORMAL LOW (ref 36.0–46.0)
Hemoglobin: 9.7 g/dL — ABNORMAL LOW (ref 12.0–15.0)
MCH: 26.2 pg (ref 26.0–34.0)
MCHC: 30.6 g/dL (ref 30.0–36.0)
MCV: 85.7 fL (ref 80.0–100.0)
Platelets: 207 10*3/uL (ref 150–400)
RBC: 3.7 MIL/uL — ABNORMAL LOW (ref 3.87–5.11)
RDW: 20.8 % — ABNORMAL HIGH (ref 11.5–15.5)
WBC: 5.3 10*3/uL (ref 4.0–10.5)
nRBC: 0 % (ref 0.0–0.2)

## 2021-11-09 LAB — GLUCOSE, CAPILLARY
Glucose-Capillary: 100 mg/dL — ABNORMAL HIGH (ref 70–99)
Glucose-Capillary: 71 mg/dL (ref 70–99)
Glucose-Capillary: 75 mg/dL (ref 70–99)
Glucose-Capillary: 84 mg/dL (ref 70–99)
Glucose-Capillary: 86 mg/dL (ref 70–99)
Glucose-Capillary: 93 mg/dL (ref 70–99)

## 2021-11-09 LAB — HEPARIN LEVEL (UNFRACTIONATED): Heparin Unfractionated: 0.92 IU/mL — ABNORMAL HIGH (ref 0.30–0.70)

## 2021-11-09 LAB — APTT
aPTT: 123 seconds — ABNORMAL HIGH (ref 24–36)
aPTT: 72 seconds — ABNORMAL HIGH (ref 24–36)

## 2021-11-09 LAB — MAGNESIUM: Magnesium: 1.9 mg/dL (ref 1.7–2.4)

## 2021-11-09 MED ORDER — AMLODIPINE BESYLATE 5 MG PO TABS
5.0000 mg | ORAL_TABLET | Freq: Every day | ORAL | Status: DC
  Administered 2021-11-09: 12:00:00 5 mg via ORAL
  Filled 2021-11-09: qty 1

## 2021-11-09 MED ORDER — POTASSIUM CHLORIDE CRYS ER 20 MEQ PO TBCR
40.0000 meq | EXTENDED_RELEASE_TABLET | Freq: Once | ORAL | Status: AC
  Administered 2021-11-09: 40 meq via ORAL
  Filled 2021-11-09: qty 2

## 2021-11-09 MED ORDER — PEG 3350-KCL-NA BICARB-NACL 420 G PO SOLR
4000.0000 mL | Freq: Once | ORAL | Status: AC
  Administered 2021-11-09: 4000 mL via ORAL
  Filled 2021-11-09: qty 4000

## 2021-11-09 MED ORDER — LOSARTAN POTASSIUM 50 MG PO TABS
25.0000 mg | ORAL_TABLET | Freq: Every day | ORAL | Status: DC
  Administered 2021-11-09: 12:00:00 25 mg via ORAL
  Filled 2021-11-09: qty 1

## 2021-11-09 NOTE — Assessment & Plan Note (Signed)
-  Replete today

## 2021-11-09 NOTE — Progress Notes (Signed)
ANTICOAGULATION CONSULT NOTE - Follow Up Consult  Pharmacy Consult for heparin Indication:  h/o DVT  Labs: Recent Labs    11/07/21 0649 11/07/21 0713 11/07/21 2152 11/07/21 2235 11/08/21 0534 11/08/21 0922 11/08/21 2128 11/09/21 0620  HGB  --  12.1  --   --  9.8*  --   --  9.7*  HCT  --  39.0  --   --  32.4*  --   --  31.7*  PLT  --  268  --   --  224  --   --  207  APTT  --   --   --    < >  --  61* 63* 123*  HEPARINUNFRC  --   --   --    < > >1.10*  --  0.79* 0.92*  CREATININE  --  1.19*  --   --  0.75  --   --  0.55  TROPONINIHS 14  --  13  --   --   --   --   --    < > = values in this interval not displayed.    Assessment: 76yo female supratherapeutic on heparin after rate changes; no infusion issues or signs of bleeding per RN.  Goal of Therapy:  aPTT 66-102 seconds   Plan:  Will decrease heparin infusion by 1-2 units/kg/hr to 1350 units/hr and check PTT in 6 hours.    Wynona Neat, PharmD, BCPS  11/09/2021,7:33 AM

## 2021-11-09 NOTE — H&P (View-Only) (Signed)
Subjective: Navel pain, which is unchanged.  Good bowel movements yesterday.  Objective: Vital signs in last 24 hours: Temp:  [99 F (37.2 C)] 99 F (37.2 C) (01/08 1316) Pulse Rate:  [72-75] 72 (01/08 2205) Resp:  [16] 16 (01/08 2205) BP: (130-150)/(64-68) 150/68 (01/08 2205) SpO2:  [98 %] 98 % (01/08 2205) Last BM Date: 11/08/21  Intake/Output from previous day: 01/08 0701 - 01/09 0700 In: 2008.7 [I.V.:2008.7] Out: -  Intake/Output this shift: No intake/output data recorded.  General appearance: alert and no distress GI: soft, non-tender; bowel sounds normal; no masses,  no organomegaly  Lab Results: Recent Labs    11/07/21 0713 11/08/21 0534 11/09/21 0620  WBC 6.8 5.9 5.3  HGB 12.1 9.8* 9.7*  HCT 39.0 32.4* 31.7*  PLT 268 224 207   BMET Recent Labs    11/06/21 1109 11/07/21 0713 11/08/21 0534  NA 137 138 141  K 3.9 3.7 3.3*  CL 104 104 112*  CO2 20* 23 21*  GLUCOSE 228* 168* 101*  BUN 11 30* 20  CREATININE 0.66 1.19* 0.75  CALCIUM 9.2 8.5* 7.9*   LFT Recent Labs    11/06/21 1109  PROT 8.1  ALBUMIN 3.8  AST 17  ALT 18  ALKPHOS 72  BILITOT 0.9   PT/INR No results for input(s): LABPROT, INR in the last 72 hours. Hepatitis Panel No results for input(s): HEPBSAG, HCVAB, HEPAIGM, HEPBIGM in the last 72 hours. C-Diff No results for input(s): CDIFFTOX in the last 72 hours. Fecal Lactopherrin No results for input(s): FECLLACTOFRN in the last 72 hours.  Studies/Results: DG Abd 1 View  Result Date: 11/07/2021 CLINICAL DATA:  Abdominal pain and vomiting. EXAM: ABDOMEN - 1 VIEW COMPARISON:  Scout film from CT scan 04/06/2022. X-rays from 06/03/2021 FINDINGS: Diffuse gaseous small bowel distension noted, mildly decreased compared to scout film from yesterday's CT. Stool-filled loops of small bowel and/or colon noted in the left abdomen. Left femoral vascular stent device noted. Surgical clips noted right upper quadrant. IMPRESSION: Diffuse gaseous  distention of small bowel is mildly decreased compared to scout film from yesterday's CT. Electronically Signed   By: Misty Stanley M.D.   On: 11/07/2021 13:40   DG Abd Portable 1V  Result Date: 11/08/2021 CLINICAL DATA:  Constipation EXAM: PORTABLE ABDOMEN - 1 VIEW COMPARISON:  Prior abdominal radiograph 11/07/2021 FINDINGS: Significantly reduced colonic stool burden. Metallic stent projects over the left common and external iliac veins. Surgical clips in the right upper quadrant consistent with prior cholecystectomy. No evidence of bowel obstruction. No acute osseous abnormality. IMPRESSION: Significant interval reduction in colonic stool burden. Electronically Signed   By: Jacqulynn Cadet M.D.   On: 11/08/2021 07:08    Medications: Scheduled:  insulin aspart  0-9 Units Subcutaneous TID WC   pantoprazole (PROTONIX) IV  40 mg Intravenous Q12H   polyethylene glycol-electrolytes  4,000 mL Oral Once   senna-docusate  1 tablet Oral BID   sodium chloride flush  3 mL Intravenous Q12H   Continuous:  sodium chloride 75 mL/hr at 11/08/21 1132   heparin 1,500 Units/hr (11/08/21 2320)    Assessment/Plan: 1) Severe constipation. 2) Abnormal CT scan from 05/2021. 3) Anemia - stable.   She reports good bowel movements yesterday, but she does not report any significant change with her abdominal pain.  Clinically she is stable.  Plan: 1) A colonoscopy will be attempted tomorrow.  LOS: 1 day   Diane [name] D 11/09/2021, 7:03 AM

## 2021-11-09 NOTE — Progress Notes (Addendum)
ANTICOAGULATION CONSULT NOTE - Follow Up Consult  Pharmacy Consult for heparin Indication: Hx DVT  Allergies  Allergen Reactions   Cephalexin Other (See Comments)    URINARY RETENTION = "blocked my urine"   Tapentadol     Other reaction(s): zombie Other reaction(s): zombie   Codeine Hives and Rash   Nucynta [Tapentadol Hcl] Other (See Comments)    Altered mental status    Patient Measurements: Height: 5\' 8"  (172.7 cm) Weight: 92 kg (202 lb 13.2 oz) IBW/kg (Calculated) : 63.9 Heparin Dosing Weight: 83kg  Vital Signs: Temp: 98.7 F (37.1 C) (01/09 0734) Temp Source: Oral (01/09 0734) BP: 155/80 (01/09 0734) Pulse Rate: 70 (01/09 0734)  Labs: Recent Labs     0000 11/07/21 0649 11/07/21 0713 11/07/21 2152 11/07/21 2235 11/08/21 0534 11/08/21 0922 11/08/21 2128 11/09/21 0620 11/09/21 1357  HGB   < >  --  12.1  --   --  9.8*  --   --  9.7*  --   HCT  --   --  39.0  --   --  32.4*  --   --  31.7*  --   PLT  --   --  268  --   --  224  --   --  207  --   APTT  --   --   --   --    < >  --    < > 63* 123* 72*  HEPARINUNFRC  --   --   --   --    < > >1.10*  --  0.79* 0.92*  --   CREATININE  --   --  1.19*  --   --  0.75  --   --  0.55  --   TROPONINIHS  --  14  --  13  --   --   --   --   --   --    < > = values in this interval not displayed.     Estimated Creatinine Clearance: 72 mL/min (by C-G formula based on SCr of 0.55 mg/dL).   Medical History: Past Medical History:  Diagnosis Date   Abnormal nuclear stress test 10/18/2013   Arm pain 10/18/2013   Arthritis    Cataract    RIGHT EYE   Chest pain 10/18/2013   Diabetes mellitus    dx over 5 yrs ago   GERD (gastroesophageal reflux disease)    Heart murmur    "yrs ago in [place] -- been here since 1999, "   History of hiatal hernia    Hypertension    Prolonged Q-T interval on ECG 04/20/2017   Pulmonary edema 04/20/2017    Assessment: 44 YOF presenting with abdominal pain, N/V, on Eliquis PTA for hx  of DVT, last dose taken 1/5.  PTT is now in range at 72. We will continue with the same rate and check in AM.   Goal of Therapy:  Heparin level 0.3-0.7 units/ml aPTT 66-102 seconds Monitor platelets by anticoagulation protocol: Yes   Plan:  Continue heparin infusion 1350 units/hr  Monitor daily HL, aPTT and s/s of bleeding   Onnie Boer, PharmD, BCIDP, AAHIVP, CPP Infectious Disease Pharmacist 11/09/2021 2:57 PM

## 2021-11-09 NOTE — Plan of Care (Signed)

## 2021-11-09 NOTE — Progress Notes (Signed)
PROGRESS NOTE  Diane [name] DXA:[id number] DOB: 1946/07/16 DOA: 11/06/2021 PCP: [name], MD   LOS: 1 day   Brief Narrative / Interim history: 76 year old female with HTN, CAD with prior non-STEMI, left DVT status post mechanical thrombectomy August 2022 on chronic anticoagulation, DM2, history of SBO, chronic constipation comes in with abdominal pain, nausea or vomiting.  Symptoms started 2 days prior to admission.  She has been nauseous and unable to any food or liquids down.  A prior CT was concerning for partial obstruction at the level of the ileocecal valve.  Colonoscopy last year was unable to be fully completed due to poor prep.  GI was consulted in the ED and she was admitted to the hospital.  Subjective / 24h Interval events: Complains of persistent abdominal pain.  She has had another bowel movement.  Abdominal pain is throughout.  She is not hungry but wants to try some liquids  Assessment & Plan: * Constipation- (present on admission) -Patient was admitted to the hospital with generalized abdominal pain, nausea, vomiting in the setting of functional obstruction due to severe constipation.  Prior imaging raise concerns about partial obstruction at the ileocecal valve.  GI consulted.  She has received several enemas yesterday in the ED and she has been placed on aggressive bowel regimen with some improvement in her symptoms.  Abdominal x-ray this morning shows improvement.  Remains symptomatic, Dr. Benson Norway evaluated patient this morning and plans for colonoscopy tomorrow morning   Type 2 diabetes mellitus with diabetic neuropathy, unspecified ([place])- (present on admission) -Last hemoglobin A1c was 7.8.  Has been placed on sliding scale, continue.  CBGs acceptable  CBG (last 3)  Recent Labs    11/09/21 0057 11/09/21 0541 11/09/21 0735  GLUCAP 93 86 84    Hypertension- (present on admission) -Home blood pressure regimen includes amlodipine 5 mg daily, furosemide 20 mg  daily, losartan 100 mg daily -Blood pressure increasing, resume amlodipine and low-dose losartan  AKI (acute kidney injury) ([place])- (present on admission) - Likely due to dehydration in the setting of poor p.o. intake, creatinine normalized with fluids  Hypokalemia -Replete today  Hyperlipidemia, unspecified - Continue to hold statin  GERD (gastroesophageal reflux disease)- (present on admission) -Continue protonix  SIRS (systemic inflammatory response syndrome) ([place])- (present on admission) - Do not suspect an infectious source, improved  History of DVT (deep vein thrombosis) -She is on chronic anticoagulation with Eliquis which is now on hold.  Continue heparin drip.  She has history of DVT distal of the IVC extending throughout left leg s/p thrombectomy on 02/06/2021 by Dr. Oneida Alar, and recurrence requiring repeat thrombectomy with balloon angioplasty 8/1 by Dr. Donzetta Matters -Resume aspirin and statin once able  Anemia - Hemoglobin stable, no bleeding, possibly dilutional component given IV fluids.  Stable today, no bleeding  Rheumatoid arthritis ([place])- (present on admission) -Continue methotrexate likely in outpatient setting, for now hold   Scheduled Meds:  amLODipine  5 mg Oral Daily   insulin aspart  0-9 Units Subcutaneous TID WC   losartan  25 mg Oral Daily   pantoprazole (PROTONIX) IV  40 mg Intravenous Q12H   polyethylene glycol-electrolytes  4,000 mL Oral Once   potassium chloride  40 mEq Oral Once   senna-docusate  1 tablet Oral BID   sodium chloride flush  3 mL Intravenous Q12H   Continuous Infusions:  sodium chloride 75 mL/hr at 11/09/21 1023   heparin 1,350 Units/hr (11/09/21 1022)   PRN Meds:.acetaminophen **OR** acetaminophen, albuterol, simethicone  Diet Orders (From admission, onward)     Start     Ordered   11/10/21 0001  Diet NPO time specified  Diet effective midnight        11/09/21 0700   11/08/21 0950  Diet clear liquid Room service appropriate? Yes;  Fluid consistency: Thin  Diet effective now       Question Answer Comment  Room service appropriate? Yes   Fluid consistency: Thin      11/08/21 0950            DVT prophylaxis: heparin infusion  Lab Results  Component Value Date   PLT 207 11/09/2021      Code Status: Full Code  Family Communication: No family at bedside  Status is: Inpatient   Level of care: Med-Surg  Consultants:  GI  Procedures:  none  Microbiology  none  Antimicrobials: none    Objective: Vitals:   11/08/21 0416 11/08/21 1316 11/08/21 2205 11/09/21 0734  BP: 121/60 130/64 (!) 150/68 (!) 155/80  Pulse: 90 75 72 70  Resp: 14 16 16 17   Temp: 98.9 F (37.2 C) 99 F (37.2 C)  98.7 F (37.1 C)  TempSrc: Axillary Oral  Oral  SpO2: 92% 98% 98% 100%  Weight:      Height:        Intake/Output Summary (Last 24 hours) at 11/09/2021 1114 Last data filed at 11/09/2021 0300 Gross per 24 hour  Intake 2008.69 ml  Output --  Net 2008.69 ml    Wt Readings from Last 3 Encounters:  11/07/21 92 kg  10/06/21 91.7 kg  09/19/21 92.1 kg    Examination:  Constitutional: No distress Eyes: Anicteric ENMT: Mucous membranes are moist.  Neck: normal, supple Respiratory: Lungs are clear bilaterally without wheezing or crackles heard. Cardiovascular: Regular rate and rhythm 3/6 SEM.  No peripheral edema Abdomen: Remains diffusely tender to palpation without guarding or rebound Musculoskeletal: no clubbing / cyanosis.  Skin: No rashes seen Neurologic: No focal deficits   Data Reviewed: I have independently reviewed following labs and imaging studies  CBC Recent Labs  Lab 11/06/21 1109 11/07/21 0713 11/08/21 0534 11/09/21 0620  WBC 12.4* 6.8 5.9 5.3  HGB 14.9 12.1 9.8* 9.7*  HCT 47.1* 39.0 32.4* 31.7*  PLT 281 268 224 207  MCV 84.7 84.2 86.4 85.7  MCH 26.8 26.1 26.1 26.2  MCHC 31.6 31.0 30.2 30.6  RDW 21.6* 21.1* 20.9* 20.8*     Recent Labs  Lab 11/06/21 1109 11/07/21 0315  11/07/21 0525 11/07/21 0649 11/07/21 0713 11/07/21 0810 11/08/21 0534 11/09/21 0620  NA 137  --   --   --  138  --  141 139  K 3.9  --   --   --  3.7  --  3.3* 3.2*  CL 104  --   --   --  104  --  112* 109  CO2 20*  --   --   --  23  --  21* 24  GLUCOSE 228*  --   --   --  168*  --  101* 96  BUN 11  --   --   --  30*  --  20 7*  CREATININE 0.66  --   --   --  1.19*  --  0.75 0.55  CALCIUM 9.2  --   --   --  8.5*  --  7.9* 8.1*  AST 17  --   --   --   --   --   --  12*  ALT 18  --   --   --   --   --   --  10  ALKPHOS 72  --   --   --   --   --   --  46  BILITOT 0.9  --   --   --   --   --   --  0.7  ALBUMIN 3.8  --   --   --   --   --   --  2.6*  MG  --   --   --  1.9  --   --   --  1.9  LATICACIDVEN  --  2.0* 1.7  --   --   --   --   --   HGBA1C  --   --   --   --   --  6.5*  --   --      ------------------------------------------------------------------------------------------------------------------ No results for input(s): CHOL, HDL, LDLCALC, TRIG, CHOLHDL, LDLDIRECT in the last 72 hours.  Lab Results  Component Value Date   HGBA1C 6.5 (H) 11/07/2021   ------------------------------------------------------------------------------------------------------------------ No results for input(s): TSH, T4TOTAL, T3FREE, THYROIDAB in the last 72 hours.  Invalid input(s): FREET3  Cardiac Enzymes No results for input(s): CKMB, TROPONINI, MYOGLOBIN in the last 168 hours.  Invalid input(s): CK ------------------------------------------------------------------------------------------------------------------    Component Value Date/Time   BNP 51.6 07/02/2021 1255    `CBG: Recent Labs  Lab 11/08/21 1642 11/08/21 2158 11/09/21 0057 11/09/21 0541 11/09/21 0735  GLUCAP 86 61* 93 86 84     Recent Results (from the past 240 hour(s))  Resp Panel by RT-PCR (Flu A&B, Covid) Nasopharyngeal Swab     Status: None   Collection Time: 11/07/21 12:05 AM   Specimen: Nasopharyngeal  Swab; Nasopharyngeal(NP) swabs in vial transport medium  Result Value Ref Range Status   SARS Coronavirus 2 by RT PCR NEGATIVE NEGATIVE Final    Comment: (NOTE) SARS-CoV-2 target nucleic acids are NOT DETECTED.  The SARS-CoV-2 RNA is generally detectable in upper respiratory specimens during the acute phase of infection. The lowest concentration of SARS-CoV-2 viral copies this assay can detect is 138 copies/mL. A negative result does not preclude SARS-Cov-2 infection and should not be used as the sole basis for treatment or other patient management decisions. A negative result may occur with  improper specimen collection/handling, submission of specimen other than nasopharyngeal swab, presence of viral mutation(s) within the areas targeted by this assay, and inadequate number of viral copies(<138 copies/mL). A negative result must be combined with clinical observations, patient history, and epidemiological information. The expected result is Negative.  Fact Sheet for Patients:  EntrepreneurPulse.com.au  Fact Sheet for Healthcare Providers:  IncredibleEmployment.be  This test is no t yet approved or cleared by the [place] FDA and  has been authorized for detection and/or diagnosis of SARS-CoV-2 by FDA under an Emergency Use Authorization (EUA). This EUA will remain  in effect (meaning this test can be used) for the duration of the COVID-19 declaration under Section 564(b)(1) of the Act, 21 U.S.C.section 360bbb-3(b)(1), unless the authorization is terminated  or revoked sooner.       Influenza A by PCR NEGATIVE NEGATIVE Final   Influenza B by PCR NEGATIVE NEGATIVE Final    Comment: (NOTE) The Xpert Xpress SARS-CoV-2/FLU/RSV plus assay is intended as an aid in the diagnosis of influenza from Nasopharyngeal swab specimens and should not be used as a sole basis for treatment. Nasal washings and aspirates  are unacceptable for Xpert Xpress  SARS-CoV-2/FLU/RSV testing.  Fact Sheet for Patients: EntrepreneurPulse.com.au  Fact Sheet for Healthcare Providers: IncredibleEmployment.be  This test is not yet approved or cleared by the [place] FDA and has been authorized for detection and/or diagnosis of SARS-CoV-2 by FDA under an Emergency Use Authorization (EUA). This EUA will remain in effect (meaning this test can be used) for the duration of the COVID-19 declaration under Section 564(b)(1) of the Act, 21 U.S.C. section 360bbb-3(b)(1), unless the authorization is terminated or revoked.  Performed at [facility], [place] [address] [place], [place] [address]       Radiology Studies: No results found.   Marzetta Board, MD, PhD [facility]  Between 7 am - 7 pm I am available, please contact me via Amion (for emergencies) or Securechat (non urgent messages)  Between 7 pm - 7 am I am not available, please contact night coverage MD/APP via Amion

## 2021-11-09 NOTE — Progress Notes (Signed)
Subjective: Navel pain, which is unchanged.  Good bowel movements yesterday.  Objective: Vital signs in last 24 hours: Temp:  [99 F (37.2 C)] 99 F (37.2 C) (01/08 1316) Pulse Rate:  [72-75] 72 (01/08 2205) Resp:  [16] 16 (01/08 2205) BP: (130-150)/(64-68) 150/68 (01/08 2205) SpO2:  [98 %] 98 % (01/08 2205) Last BM Date: 11/08/21  Intake/Output from previous day: 01/08 0701 - 01/09 0700 In: 2008.7 [I.V.:2008.7] Out: -  Intake/Output this shift: No intake/output data recorded.  General appearance: alert and no distress GI: soft, non-tender; bowel sounds normal; no masses,  no organomegaly  Lab Results: Recent Labs    11/07/21 0713 11/08/21 0534 11/09/21 0620  WBC 6.8 5.9 5.3  HGB 12.1 9.8* 9.7*  HCT 39.0 32.4* 31.7*  PLT 268 224 207   BMET Recent Labs    11/06/21 1109 11/07/21 0713 11/08/21 0534  NA 137 138 141  K 3.9 3.7 3.3*  CL 104 104 112*  CO2 20* 23 21*  GLUCOSE 228* 168* 101*  BUN 11 30* 20  CREATININE 0.66 1.19* 0.75  CALCIUM 9.2 8.5* 7.9*   LFT Recent Labs    11/06/21 1109  PROT 8.1  ALBUMIN 3.8  AST 17  ALT 18  ALKPHOS 72  BILITOT 0.9   PT/INR No results for input(s): LABPROT, INR in the last 72 hours. Hepatitis Panel No results for input(s): HEPBSAG, HCVAB, HEPAIGM, HEPBIGM in the last 72 hours. C-Diff No results for input(s): CDIFFTOX in the last 72 hours. Fecal Lactopherrin No results for input(s): FECLLACTOFRN in the last 72 hours.  Studies/Results: DG Abd 1 View  Result Date: 11/07/2021 CLINICAL DATA:  Abdominal pain and vomiting. EXAM: ABDOMEN - 1 VIEW COMPARISON:  Scout film from CT scan 04/06/2022. X-rays from 06/03/2021 FINDINGS: Diffuse gaseous small bowel distension noted, mildly decreased compared to scout film from yesterday's CT. Stool-filled loops of small bowel and/or colon noted in the left abdomen. Left femoral vascular stent device noted. Surgical clips noted right upper quadrant. IMPRESSION: Diffuse gaseous  distention of small bowel is mildly decreased compared to scout film from yesterday's CT. Electronically Signed   By: Misty Stanley M.D.   On: 11/07/2021 13:40   DG Abd Portable 1V  Result Date: 11/08/2021 CLINICAL DATA:  Constipation EXAM: PORTABLE ABDOMEN - 1 VIEW COMPARISON:  Prior abdominal radiograph 11/07/2021 FINDINGS: Significantly reduced colonic stool burden. Metallic stent projects over the left common and external iliac veins. Surgical clips in the right upper quadrant consistent with prior cholecystectomy. No evidence of bowel obstruction. No acute osseous abnormality. IMPRESSION: Significant interval reduction in colonic stool burden. Electronically Signed   By: Jacqulynn Cadet M.D.   On: 11/08/2021 07:08    Medications: Scheduled:  insulin aspart  0-9 Units Subcutaneous TID WC   pantoprazole (PROTONIX) IV  40 mg Intravenous Q12H   polyethylene glycol-electrolytes  4,000 mL Oral Once   senna-docusate  1 tablet Oral BID   sodium chloride flush  3 mL Intravenous Q12H   Continuous:  sodium chloride 75 mL/hr at 11/08/21 1132   heparin 1,500 Units/hr (11/08/21 2320)    Assessment/Plan: 1) Severe constipation. 2) Abnormal CT scan from 05/2021. 3) Anemia - stable.   She reports good bowel movements yesterday, but she does not report any significant change with her abdominal pain.  Clinically she is stable.  Plan: 1) A colonoscopy will be attempted tomorrow.  LOS: 1 day   Aria Jarrard D 11/09/2021, 7:03 AM

## 2021-11-10 ENCOUNTER — Encounter (HOSPITAL_COMMUNITY)
Admission: EM | Disposition: A | Discharge status: Home / Self Care | Payer: Self-pay | Source: Home / Self Care | Attending: Internal Medicine

## 2021-11-10 ENCOUNTER — Inpatient Hospital Stay (HOSPITAL_COMMUNITY): Payer: Medicare Other | Admitting: Critical Care Medicine

## 2021-11-10 ENCOUNTER — Encounter (HOSPITAL_COMMUNITY): Payer: Self-pay | Admitting: Internal Medicine

## 2021-11-10 DIAGNOSIS — N179 Acute kidney failure, unspecified: Secondary | ICD-10-CM

## 2021-11-10 HISTORY — PX: POLYPECTOMY: SHX5525

## 2021-11-10 HISTORY — PX: COLONOSCOPY WITH PROPOFOL: SHX5780

## 2021-11-10 LAB — COMPREHENSIVE METABOLIC PANEL
ALT: 12 U/L (ref 0–44)
AST: 15 U/L (ref 15–41)
Albumin: 3 g/dL — ABNORMAL LOW (ref 3.5–5.0)
Alkaline Phosphatase: 46 U/L (ref 38–126)
Anion gap: 9 (ref 5–15)
BUN: <5 mg/dL — ABNORMAL LOW (ref 8–23)
CO2: 20 mmol/L — ABNORMAL LOW (ref 22–32)
Calcium: 8.3 mg/dL — ABNORMAL LOW (ref 8.9–10.3)
Chloride: 113 mmol/L — ABNORMAL HIGH (ref 98–111)
Creatinine, Ser: 0.58 mg/dL (ref 0.44–1.00)
GFR, Estimated: >60 mL/min (ref >60)
Glucose, Bld: 89 mg/dL (ref 70–99)
Potassium: 3.4 mmol/L — ABNORMAL LOW (ref 3.5–5.1)
Sodium: 142 mmol/L (ref 135–145)
Total Bilirubin: 0.6 mg/dL (ref 0.3–1.2)
Total Protein: 6 g/dL — ABNORMAL LOW (ref 6.5–8.1)

## 2021-11-10 LAB — APTT: aPTT: 94 seconds — ABNORMAL HIGH (ref 24–36)

## 2021-11-10 LAB — CBC
HCT: 33.1 % — ABNORMAL LOW (ref 36.0–46.0)
Hemoglobin: 10.4 g/dL — ABNORMAL LOW (ref 12.0–15.0)
MCH: 26.5 pg (ref 26.0–34.0)
MCHC: 31.4 g/dL (ref 30.0–36.0)
MCV: 84.4 fL (ref 80.0–100.0)
Platelets: 205 10*3/uL (ref 150–400)
RBC: 3.92 MIL/uL (ref 3.87–5.11)
RDW: 20.5 % — ABNORMAL HIGH (ref 11.5–15.5)
WBC: 5 10*3/uL (ref 4.0–10.5)
nRBC: 0 % (ref 0.0–0.2)

## 2021-11-10 LAB — GLUCOSE, CAPILLARY
Glucose-Capillary: 99 mg/dL (ref 70–99)
Glucose-Capillary: 72 mg/dL (ref 70–99)
Glucose-Capillary: 114 mg/dL — ABNORMAL HIGH (ref 70–99)

## 2021-11-10 LAB — HEPARIN LEVEL (UNFRACTIONATED): Heparin Unfractionated: 0.5 IU/mL (ref 0.30–0.70)

## 2021-11-10 SURGERY — COLONOSCOPY WITH PROPOFOL
Anesthesia: Monitor Anesthesia Care

## 2021-11-10 MED ORDER — FENTANYL CITRATE (PF) 100 MCG/2ML IJ SOLN: 25.0000 ug | INTRAMUSCULAR | Status: DC | PRN

## 2021-11-10 MED ORDER — LABETALOL HCL 5 MG/ML IV SOLN
5.0000 mg | Freq: Once | INTRAVENOUS | Status: AC
  Administered 2021-11-10: 5 mg via INTRAVENOUS

## 2021-11-10 MED ORDER — SODIUM CHLORIDE 0.9 % IV SOLN: INTRAVENOUS | Status: DC

## 2021-11-10 MED ORDER — PROPOFOL 500 MG/50ML IV EMUL
INTRAVENOUS | Status: DC | PRN
  Administered 2021-11-10: 150 ug/kg/min via INTRAVENOUS

## 2021-11-10 MED ORDER — LABETALOL HCL 5 MG/ML IV SOLN
INTRAVENOUS | Status: AC
  Filled 2021-11-10: qty 4

## 2021-11-10 MED ORDER — SENNOSIDES-DOCUSATE SODIUM 8.6-50 MG PO TABS: 1.0000 | ORAL_TABLET | Freq: Every day | ORAL | 1 refills | Status: DC

## 2021-11-10 MED ORDER — PROPOFOL 10 MG/ML IV BOLUS
INTRAVENOUS | Status: DC | PRN
  Administered 2021-11-10: 20 mg via INTRAVENOUS

## 2021-11-10 MED ORDER — ACETAMINOPHEN 10 MG/ML IV SOLN
1000.0000 mg | Freq: Once | INTRAVENOUS | Status: DC | PRN
  Filled 2021-11-10: qty 100

## 2021-11-10 SURGICAL SUPPLY — 22 items

## 2021-11-10 NOTE — Op Note (Addendum)
[facility] [name] Procedure Date : 11/10/2021 MRN: [id number] Attending MD: Carol Ada , MD Date of Birth: July 24, 1946 CSN: [id number] Age: 76 Admit Type: Inpatient Procedure:                Colonoscopy Indications:              Abnormal CT of the GI tract Providers:                Carol Ada, MD, Jaci Carrel, RN, Cletis Athens,                            Technician, Merrilyn Puma, CRNA Referring MD:              Medicines:                Propofol per Anesthesia Complications:            No immediate complications. Estimated Blood Loss:     Estimated blood loss: none. Procedure:                Pre-Anesthesia Assessment:                           - Prior to the procedure, a History and Physical                            was performed, and patient medications and                            allergies were reviewed. The patient's tolerance of                            previous anesthesia was also reviewed. The risks                            and benefits of the procedure and the sedation                            options and risks were discussed with the patient.                            All questions were answered, and informed consent                            was obtained. Prior Anticoagulants: The patient has                            taken Eliquis (apixaban), last dose was 5 days                            prior to procedure. ASA Grade Assessment: III - A                            patient with severe systemic disease. After  reviewing the risks and benefits, the patient was                            deemed in satisfactory condition to undergo the                            procedure.                           - Sedation was administered by an anesthesia                            professional. Deep sedation was attained.                           After obtaining informed consent, the colonoscope                             was passed under direct vision. Throughout the                            procedure, the patient's blood pressure, pulse, and                            oxygen saturations were monitored continuously. The                            CF-HQ190L ([id number] Olympus colonoscope was                            introduced through the anus and advanced to the the                            cecum, identified by appendiceal orifice and                            ileocecal valve. The colonoscopy was technically                            difficult and complex due to significant looping                            and a tortuous colon. Successful completion of the                            procedure was aided by using manual pressure and                            straightening and shortening the scope to obtain                            bowel loop reduction. The patient tolerated the  procedure well. The quality of the bowel                            preparation was evaluated using the BBPS [facility]                            Bowel Preparation Scale) with scores of: Right                            Colon = 2 (minor amount of residual staining, small                            fragments of stool and/or opaque liquid, but mucosa                            seen well), Transverse Colon = 2 (minor amount of                            residual staining, small fragments of stool and/or                            opaque liquid, but mucosa seen well) and Left Colon                            = 2 (minor amount of residual staining, small                            fragments of stool and/or opaque liquid, but mucosa                            seen well). The total BBPS score equals 6. The                            quality of the bowel preparation was good. The                            ileocecal valve, appendiceal orifice, and rectum                            were  photographed. Scope In: 1:35:42 PM Scope Out: 2:17:55 PM Scope Withdrawal Time: 0 hours 32 minutes 20 seconds  Total Procedure Duration: 0 hours 42 minutes 13 seconds  Findings:      Four sessile polyps were found in the cecum. The polyps were 3 to 4 mm       in size. These polyps were removed with a cold snare. Resection and       retrieval were complete.      Scattered medium-mouthed diverticula were found in the entire colon. Impression:               - Four 3 to 4 mm polyps in the cecum, removed with  a cold snare. Resected and retrieved.                           - Diverticulosis in the entire examined colon. Recommendation:           - Return patient to hospital ward for ongoing care.                           - Resume regular diet.                           - Continue present medications.                           - Await pathology results.                           - Okay to resume anticoagulation.                           - Follow up with Dr. Collene Mares in 4 weeks. Procedure Code(s):        --- Professional ---                           [phone], Colonoscopy, flexible; with removal of                            tumor(s), polyp(s), or other lesion(s) by snare                            technique Diagnosis Code(s):        --- Professional ---                           K63.5, Polyp of colon                           K57.30, Diverticulosis of large intestine without                            perforation or abscess without bleeding                           R93.3, Abnormal findings on diagnostic imaging of                            other parts of digestive tract CPT copyright 2019 [employer]. All rights reserved. The codes documented in this report are preliminary and upon coder review may  be revised to meet current compliance requirements. Carol Ada, MD Carol Ada, MD 11/10/2021 2:24:35 PM This report has been signed  electronically. Number of Addenda: 0

## 2021-11-10 NOTE — Anesthesia Procedure Notes (Signed)
Procedure Name: MAC Date/Time: 11/10/2021 1:30 PM Performed by: Wilburn Cornelia, CRNA Pre-anesthesia Checklist: Patient identified, Emergency Drugs available, Suction available, Patient being monitored and Timeout performed Patient Re-evaluated:Patient Re-evaluated prior to induction Oxygen Delivery Method: Simple face mask Placement Confirmation: positive ETCO2 and breath sounds checked- equal and bilateral Dental Injury: Teeth and Oropharynx as per pre-operative assessment

## 2021-11-10 NOTE — Transfer of Care (Signed)
Immediate Anesthesia Transfer of Care Note  Patient: Diane [name]  Procedure(s) Performed: COLONOSCOPY WITH PROPOFOL POLYPECTOMY  Patient Location: Endoscopy Unit  Anesthesia Type:MAC  Level of Consciousness: awake and alert   Airway & Oxygen Therapy: Patient Spontanous Breathing  Post-op Assessment: Report given to RN and Post -op Vital signs reviewed and stable  Post vital signs: Reviewed and stable  Last Vitals:  Vitals Value Taken Time  BP 160/78 11/10/21 1426  Temp    Pulse 84 11/10/21 1427  Resp 28 11/10/21 1427  SpO2 100 % 11/10/21 1427  Vitals shown include unvalidated device data.  Last Pain:  Vitals:   11/10/21 1252  TempSrc: Temporal  PainSc: 0-No pain      Patients Stated Pain Goal: 2 (58/30/94 0768)  Complications: No notable events documented.

## 2021-11-10 NOTE — Anesthesia Postprocedure Evaluation (Signed)
Anesthesia Post Note  Patient: Diane [name]  Procedure(s) Performed: COLONOSCOPY WITH PROPOFOL POLYPECTOMY     Patient location during evaluation: PACU Anesthesia Type: MAC Level of consciousness: awake and alert Pain management: pain level controlled Vital Signs Assessment: post-procedure vital signs reviewed and stable Respiratory status: spontaneous breathing, nonlabored ventilation, respiratory function stable and patient connected to nasal cannula oxygen Cardiovascular status: stable and blood pressure returned to baseline Postop Assessment: no apparent nausea or vomiting Anesthetic complications: no   No notable events documented.  Last Vitals:  Vitals:   11/10/21 1252 11/10/21 1426  BP: (!) 196/82   Pulse: 73 86  Resp: 20 (!) 26  Temp: (!) 36.2 C   SpO2:  100%    Last Pain:  Vitals:   11/10/21 1252  TempSrc: Temporal  PainSc: 0-No pain                 Barnet Glasgow

## 2021-11-10 NOTE — Plan of Care (Signed)

## 2021-11-10 NOTE — Discharge Summary (Signed)
Physician Discharge Summary  Nataleah Scioneaux Trang YTK:[id number] DOB: [birth year] DOA: 11/06/2021  PCP: [name], MD  Admit date: 11/06/2021 Discharge date: 11/10/2021  Admitted From: home Disposition:  home  Recommendations for Outpatient Follow-up:  Follow up with PCP in 1-2 weeks Please follow up on the following pending results: polyps pathology   Home Health: none Equipment/Devices: none  Discharge Condition: stable CODE STATUS: Full code Diet recommendation: regular  HPI: Per admitting MD, Diane [name] is a 76 y.o. female with medical history significant of hypertension, NSTEMI, left DVT s/p mechanical thrombectomy 06/01/21 on chronic anticoagulation, diabetes mellitus type 2, history of SBO, chronic constipation, and GERD presents with complaints of abdominal pain with nausea and vomiting.  Symptoms started 2 days ago.  Reports having severe generalized sharp pain across her abdomen.  Her belly is very distended and pain worsened with palpation and any movement.  She had been nauseous and unable to keep any food or liquids down for which she came to the ER yesterday morning.  Emesis was reported to be yellow in color, and denies seeing any fecal matter.  Associated symptoms include generalized malaise, some shortness of breath with pain, and inability to rest due to symptoms.  She had been on MiraLAX and started which she thinks was Metamucil 2 weeks ago after seeing it on a TV commercial.  Patient has long history of constipation and prior small bowel obstructions requiring nasogastric tube placement.  Records note she was supposed to have colonoscopy but was changed to a flexible sigmoidoscopy with Dr. Collene Mares in 12/2020 due to poor prep and ultimately procedure was aborted.  Hospital Course / Discharge diagnoses: * Constipation- (present on admission) -Patient was admitted to the hospital with generalized abdominal pain, nausea, vomiting in the setting of functional obstruction due  to severe constipation.  Prior imaging raise concerns about partial obstruction at the ileocecal valve.  GI consulted.  She has received several enemas, has been placed on aggressive bowel regimen with improvement in her symptoms.  She underwent a colonoscopy on 1/10 which showed four 3 to 4 mm polyps in the cecum, removed with a cold snare.  It also showed diverticulosis in the entire colon.  She recovered well post procedurally, feeling better, discussed case with Dr. Benson Norway with GI and she will be discharged home in stable condition.  She can resume anticoagulation on discharge.  She will follow-up as an outpatient with Dr. Collene Mares in follow-up on on polyp pathology   Type 2 diabetes mellitus with diabetic neuropathy, unspecified ([place])- (present on admission) -Last hemoglobin A1c was 7.8.  Continue home medications  Hypertension- (present on admission) -Continue home medications  AKI (acute kidney injury) ([employer])- (present on admission) - Likely due to dehydration in the setting of poor p.o. intake, resolved  Hypokalemia -Replete today  Hyperlipidemia, unspecified - Continue to hold statin  GERD (gastroesophageal reflux disease)- (present on admission) -Continue protonix  SIRS (systemic inflammatory response syndrome) ([place])- (present on admission) - Do not suspect an infectious source, improved  History of DVT (deep vein thrombosis) -She is on chronic anticoagulation with Eliquis which is now on hold.  Continue heparin drip.  She has history of DVT distal of the IVC extending throughout left leg s/p thrombectomy on 02/06/2021 by Dr. Oneida Alar, and recurrence requiring repeat thrombectomy with balloon angioplasty 8/1 by Dr. Donzetta Matters -Resume aspirin and statin once able  Anemia - Hemoglobin stable, no bleeding, possibly dilutional component given IV fluids.  Stable today, no bleeding  Rheumatoid  arthritis ([place])- (present on admission) -Continue methotrexate likely in outpatient setting, for now  hold    Sepsis ruled out   Discharge Instructions   Allergies as of 11/10/2021       Reactions   Cephalexin Other (See Comments)   URINARY RETENTION = "blocked my urine"   Tapentadol    Other reaction(s): zombie Other reaction(s): zombie   Codeine Hives, Rash   Nucynta [tapentadol Hcl] Other (See Comments)   Altered mental status        Medication List     STOP taking these medications    losartan 100 MG tablet Commonly known as: COZAAR   oxyCODONE-acetaminophen 5-325 MG tablet Commonly known as: Percocet       TAKE these medications    acetaminophen 500 MG tablet Commonly known as: TYLENOL Take 1,000 mg by mouth every 6 (six) hours as needed for moderate pain or headache.   amLODipine 5 MG tablet Commonly known as: NORVASC Take 5 mg by mouth daily.   apixaban 5 MG Tabs tablet Commonly known as: ELIQUIS Take 5 mg by mouth 2 (two) times daily.   aspirin EC 81 MG tablet Take 81 mg by mouth daily. Swallow whole.   docusate sodium 100 MG capsule Commonly known as: COLACE Take 1 capsule (100 mg total) by mouth 2 (two) times daily.   DULoxetine 20 MG capsule Commonly known as: CYMBALTA Take 20 mg by mouth every morning.   folic acid 1 MG tablet Commonly known as: FOLVITE Take 1 tablet (1 mg total) by mouth daily.   furosemide 20 MG tablet Commonly known as: LASIX Take 20 mg by mouth daily.   gabapentin 300 MG capsule Commonly known as: NEURONTIN Take 300 mg by mouth at bedtime.   metFORMIN 500 MG 24 hr tablet Commonly known as: GLUCOPHAGE-XR Take 1,000 mg by mouth daily with breakfast.   methotrexate 2.5 MG tablet Commonly known as: RHEUMATREX Take 8 tablets (20 mg total) by mouth once a week. 8 tablets weekly (Sunday) What changed:  when to take this additional instructions   naproxen sodium 220 MG tablet Commonly known as: ALEVE Take 440 mg by mouth daily as needed (pain).   oxybutynin 5 MG 24 hr tablet Commonly known as:  DITROPAN-XL Take 5 mg by mouth daily.   pantoprazole 40 MG tablet Commonly known as: PROTONIX Take 1 tablet (40 mg total) by mouth daily.   polyethylene glycol 17 g packet Commonly known as: MIRALAX / GLYCOLAX Take 17 g by mouth 2 (two) times daily. What changed:  when to take this reasons to take this   rosuvastatin 10 MG tablet Commonly known as: CRESTOR Take 1 tablet (10 mg total) by mouth daily.   senna-docusate 8.6-50 MG tablet Commonly known as: Senokot-S Take 1 tablet by mouth at bedtime.   simethicone 80 MG chewable tablet Commonly known as: MYLICON Chew 1 tablet (80 mg total) by mouth 4 (four) times daily. What changed:  when to take this reasons to take this   telmisartan 80 MG tablet Commonly known as: MICARDIS Take 80 mg by mouth at bedtime.         Consultations: GI  Procedures/Studies:  DG Abd 1 View  Result Date: 11/07/2021 CLINICAL DATA:  Abdominal pain and vomiting. EXAM: ABDOMEN - 1 VIEW COMPARISON:  Scout film from CT scan 04/06/2022. X-rays from 06/03/2021 FINDINGS: Diffuse gaseous small bowel distension noted, mildly decreased compared to scout film from yesterday's CT. Stool-filled loops of small bowel and/or colon noted  in the left abdomen. Left femoral vascular stent device noted. Surgical clips noted right upper quadrant. IMPRESSION: Diffuse gaseous distention of small bowel is mildly decreased compared to scout film from yesterday's CT. Electronically Signed   By: Misty [place] M.D.   On: 11/07/2021 13:40   CT ABDOMEN PELVIS W CONTRAST  Result Date: 11/06/2021 CLINICAL DATA:  Generalized abdominal pain, vomiting EXAM: CT ABDOMEN AND PELVIS WITH CONTRAST TECHNIQUE: Multidetector CT imaging of the abdomen and pelvis was performed using the standard protocol following bolus administration of intravenous contrast. CONTRAST:  125mL OMNIPAQUE IOHEXOL 300 MG/ML  SOLN COMPARISON:  09/14/2021 FINDINGS: Lower chest: No acute pleural or parenchymal lung  disease. Hepatobiliary: Mild diffuse fatty infiltration of the liver. No focal hepatic abnormalities. Gallbladder is surgically absent. Pancreas: Unremarkable. No pancreatic ductal dilatation or surrounding inflammatory changes. Spleen: Normal in size without focal abnormality. Adrenals/Urinary Tract: Adrenal glands are unremarkable. Kidneys are normal, without renal calculi, focal lesion, or hydronephrosis. Bladder is unremarkable. Stomach/Bowel: The stomach, duodenum, and proximal jejunum are decompressed. Within the right lower quadrant, there is change in caliber at the level of the distal jejunum, with transition point identified image 56/3. The distal jejunum and ileum are markedly dilated, with formed stool identified within the ileum. Maximal caliber of the small bowel measures 5.2 cm. Gas and stool are seen throughout the colon, with moderate fecal retention again noted. I do not see a source for colonic obstruction. No bowel wall thickening.  No inflammatory changes. Vascular/Lymphatic: Minimal atherosclerosis. Stent extending from the left external iliac through the left common iliac veins unchanged. No pathologic adenopathy. Reproductive: Status post hysterectomy. No adnexal masses. Other: Small amount of ascites throughout the abdomen and pelvis, slightly increased since prior study. No free intraperitoneal gas. No abdominal wall hernia. Musculoskeletal: No acute or destructive bony lesions. Reconstructed images demonstrate no additional findings. IMPRESSION: 1. Dilated distal small bowel involving the distal jejunum through the ileum. Formed stool within the small bowel, with moderate fecal retention throughout the colon. I do not see an anatomic source for distal obstruction, and this could reflect functional obstruction due to colonic stasis and constipation. 2. Small volume ascites, increased since prior. 3.  Aortic Atherosclerosis (ICD10-I70.0). 4. Mild hepatic steatosis. Electronically Signed    By: Randa Ngo M.D.   On: 11/06/2021 15:11   DG Abd Portable 1V  Result Date: 11/08/2021 CLINICAL DATA:  Constipation EXAM: PORTABLE ABDOMEN - 1 VIEW COMPARISON:  Prior abdominal radiograph 11/07/2021 FINDINGS: Significantly reduced colonic stool burden. Metallic stent projects over the left common and external iliac veins. Surgical clips in the right upper quadrant consistent with prior cholecystectomy. No evidence of bowel obstruction. No acute osseous abnormality. IMPRESSION: Significant interval reduction in colonic stool burden. Electronically Signed   By: Jacqulynn Cadet M.D.   On: 11/08/2021 07:08     Subjective: - no chest pain, shortness of breath, no abdominal pain, nausea or vomiting.   Discharge Exam: BP (!) 166/75 (BP Location: Left Arm)    Pulse 68    Temp 98.5 F (36.9 C) (Oral)    Resp 16    Ht 5\' 8"  (1.727 m)    Wt 92 kg    SpO2 90%    BMI 30.84 kg/m   General: Pt is alert, awake, not in acute distress Cardiovascular: RRR, S1/S2 +, no rubs, no gallops Respiratory: CTA bilaterally, no wheezing, no rhonchi Abdominal: Soft, NT, ND, bowel sounds + Extremities: no edema, no cyanosis    The results of significant  diagnostics from this hospitalization (including imaging, microbiology, ancillary and laboratory) are listed below for reference.     Microbiology: Recent Results (from the past 240 hour(s))  Resp Panel by RT-PCR (Flu A&B, Covid) Nasopharyngeal Swab     Status: None   Collection Time: 11/07/21 12:05 AM   Specimen: Nasopharyngeal Swab; Nasopharyngeal(NP) swabs in vial transport medium  Result Value Ref Range Status   SARS Coronavirus 2 by RT PCR NEGATIVE NEGATIVE Final    Comment: (NOTE) SARS-CoV-2 target nucleic acids are NOT DETECTED.  The SARS-CoV-2 RNA is generally detectable in upper respiratory specimens during the acute phase of infection. The lowest concentration of SARS-CoV-2 viral copies this assay can detect is 138 copies/mL. A negative result  does not preclude SARS-Cov-2 infection and should not be used as the sole basis for treatment or other patient management decisions. A negative result may occur with  improper specimen collection/handling, submission of specimen other than nasopharyngeal swab, presence of viral mutation(s) within the areas targeted by this assay, and inadequate number of viral copies(<138 copies/mL). A negative result must be combined with clinical observations, patient history, and epidemiological information. The expected result is Negative.  Fact Sheet for Patients:  EntrepreneurPulse.com.au  Fact Sheet for Healthcare Providers:  IncredibleEmployment.be  This test is no t yet approved or cleared by the [place] FDA and  has been authorized for detection and/or diagnosis of SARS-CoV-2 by FDA under an Emergency Use Authorization (EUA). This EUA will remain  in effect (meaning this test can be used) for the duration of the COVID-19 declaration under Section 564(b)(1) of the Act, 21 U.S.C.section 360bbb-3(b)(1), unless the authorization is terminated  or revoked sooner.       Influenza A by PCR NEGATIVE NEGATIVE Final   Influenza B by PCR NEGATIVE NEGATIVE Final    Comment: (NOTE) The Xpert Xpress SARS-CoV-2/FLU/RSV plus assay is intended as an aid in the diagnosis of influenza from Nasopharyngeal swab specimens and should not be used as a sole basis for treatment. Nasal washings and aspirates are unacceptable for Xpert Xpress SARS-CoV-2/FLU/RSV testing.  Fact Sheet for Patients: EntrepreneurPulse.com.au  Fact Sheet for Healthcare Providers: IncredibleEmployment.be  This test is not yet approved or cleared by the [place] FDA and has been authorized for detection and/or diagnosis of SARS-CoV-2 by FDA under an Emergency Use Authorization (EUA). This EUA will remain in effect (meaning this test can be used) for  the duration of the COVID-19 declaration under Section 564(b)(1) of the Act, 21 U.S.C. section 360bbb-3(b)(1), unless the authorization is terminated or revoked.  Performed at [facility], [place] [address] [place], [place] [address]      Labs: Basic Metabolic Panel: Recent Labs  Lab 11/06/21 1109 11/07/21 0649 11/07/21 0713 11/08/21 0534 11/09/21 0620 11/10/21 0149  NA 137  --  138 141 139 142  K 3.9  --  3.7 3.3* 3.2* 3.4*  CL 104  --  104 112* 109 113*  CO2 20*  --  23 21* 24 20*  GLUCOSE 228*  --  168* 101* 96 89  BUN 11  --  30* 20 7* <5*  CREATININE 0.66  --  1.19* 0.75 0.55 0.58  CALCIUM 9.2  --  8.5* 7.9* 8.1* 8.3*  MG  --  1.9  --   --  1.9  --    Liver Function Tests: Recent Labs  Lab 11/06/21 1109 11/09/21 0620 11/10/21 0149  AST 17 12* 15  ALT 18 10 12   ALKPHOS  72 46 46  BILITOT 0.9 0.7 0.6  PROT 8.1 5.5* 6.0*  ALBUMIN 3.8 2.6* 3.0*   CBC: Recent Labs  Lab 11/06/21 1109 11/07/21 0713 11/08/21 0534 11/09/21 0620 11/10/21 0149  WBC 12.4* 6.8 5.9 5.3 5.0  HGB 14.9 12.1 9.8* 9.7* 10.4*  HCT 47.1* 39.0 32.4* 31.7* 33.1*  MCV 84.7 84.2 86.4 85.7 84.4  PLT 281 268 224 207 205   CBG: Recent Labs  Lab 11/09/21 1155 11/09/21 1559 11/09/21 2013 11/10/21 0802 11/10/21 1139  GLUCAP 71 75 100* 99 72   Hgb A1c No results for input(s): HGBA1C in the last 72 hours. Lipid Profile No results for input(s): CHOL, HDL, LDLCALC, TRIG, CHOLHDL, LDLDIRECT in the last 72 hours. Thyroid function studies No results for input(s): TSH, T4TOTAL, T3FREE, THYROIDAB in the last 72 hours.  Invalid input(s): FREET3 Urinalysis    Component Value Date/Time   COLORURINE YELLOW 11/07/2021 0059   APPEARANCEUR CLOUDY (A) 11/07/2021 0059   LABSPEC 1.020 11/07/2021 0059   PHURINE 5.0 11/07/2021 0059   GLUCOSEU NEGATIVE 11/07/2021 0059   HGBUR NEGATIVE 11/07/2021 0059   BILIRUBINUR SMALL (A) 11/07/2021 0059   KETONESUR NEGATIVE 11/07/2021 0059   PROTEINUR  NEGATIVE 11/07/2021 0059   UROBILINOGEN 0.2 06/15/2014 0524   NITRITE NEGATIVE 11/07/2021 0059   LEUKOCYTESUR NEGATIVE 11/07/2021 0059    FURTHER DISCHARGE INSTRUCTIONS:   Get Medicines reviewed and adjusted: Please take all your medications with you for your next visit with your Primary MD   Laboratory/radiological data: Please request your Primary MD to go over all hospital tests and procedure/radiological results at the follow up, please ask your Primary MD to get all Hospital records sent to his/her office.   In some cases, they will be blood work, cultures and biopsy results pending at the time of your discharge. Please request that your primary care M.D. goes through all the records of your hospital data and follows up on these results.   Also Note the following: If you experience worsening of your admission symptoms, develop shortness of breath, life threatening emergency, suicidal or homicidal thoughts you must seek medical attention immediately by calling 911 or calling your MD immediately  if symptoms less severe.   You must read complete instructions/literature along with all the possible adverse reactions/side effects for all the Medicines you take and that have been prescribed to you. Take any new Medicines after you have completely understood and accpet all the possible adverse reactions/side effects.    Do not drive when taking Pain medications or sleeping medications (Benzodaizepines)   Do not take more than prescribed Pain, Sleep and Anxiety Medications. It is not advisable to combine anxiety,sleep and pain medications without talking with your primary care practitioner   Special Instructions: If you have smoked or chewed Tobacco  in the last 2 yrs please stop smoking, stop any regular Alcohol  and or any Recreational drug use.   Wear Seat belts while driving.   Please note: You were cared for by a hospitalist during your hospital stay. Once you are discharged, your  primary care physician will handle any further medical issues. Please note that NO REFILLS for any discharge medications will be authorized once you are discharged, as it is imperative that you return to your primary care physician (or establish a relationship with a primary care physician if you do not have one) for your post hospital discharge needs so that they can reassess your need for medications and monitor your lab values.  Time  coordinating discharge: 35 minutes  SIGNED:  Marzetta Board, MD, PhD 11/10/2021, 3:29 PM

## 2021-11-10 NOTE — Discharge Instructions (Signed)
Information on my medicine - ELIQUIS (apixaban)  Why was Eliquis prescribed for you? Eliquis was prescribed to treat blood clots that may have been found in the veins of your legs (deep vein thrombosis) or in your lungs (pulmonary embolism) and to reduce the risk of them occurring again.  What do You need to know about Eliquis ?  Your current dose is ONE 5 mg tablet taken TWICE daily.  Eliquis may be taken with or without food.   Try to take the dose about the same time in the morning and in the evening. If you have difficulty swallowing the tablet whole please discuss with your pharmacist how to take the medication safely.  Take Eliquis exactly as prescribed and DO NOT stop taking Eliquis without talking to the doctor who prescribed the medication.  Stopping may increase your risk of developing a new blood clot.  Refill your prescription before you run out.  After discharge, you should have regular check-up appointments with your healthcare provider that is prescribing your Eliquis.    What do you do if you miss a dose? If a dose of ELIQUIS is not taken at the scheduled time, take it as soon as possible on the same day and twice-daily administration should be resumed. The dose should not be doubled to make up for a missed dose.  Important Safety Information A possible side effect of Eliquis is bleeding. You should call your healthcare provider right away if you experience any of the following: ? Bleeding from an injury or your nose that does not stop. ? Unusual colored urine (red or dark brown) or unusual colored stools (red or black). ? Unusual bruising for unknown reasons. ? A serious fall or if you hit your head (even if there is no bleeding).  Some medicines may interact with Eliquis and might increase your risk of bleeding or clotting while on Eliquis. To help avoid this, consult your healthcare provider or pharmacist prior to using any new prescription or non-prescription  medications, including herbals, vitamins, non-steroidal anti-inflammatory drugs (NSAIDs) and supplements.  This website has more information on Eliquis (apixaban): http://www.eliquis.com/eliquis/home  

## 2021-11-10 NOTE — Progress Notes (Addendum)
ANTICOAGULATION CONSULT NOTE - Follow Up Consult  Pharmacy Consult for Heparin (Eliquis on hold) Indication:  hx DVT  Allergies  Allergen Reactions   Cephalexin Other (See Comments)    URINARY RETENTION = "blocked my urine"   Tapentadol     Other reaction(s): zombie Other reaction(s): zombie   Codeine Hives and Rash   Nucynta [Tapentadol Hcl] Other (See Comments)    Altered mental status    Patient Measurements: Height: 5\' 8"  (172.7 cm) Weight: 92 kg (202 lb 13.2 oz) IBW/kg (Calculated) : 63.9 Heparin Dosing Weight: 83 kg  Vital Signs: Temp: 98.7 F (37.1 C) (01/10 0801) Temp Source: Oral (01/10 0801) BP: 134/65 (01/10 0801) Pulse Rate: 71 (01/10 0801)  Labs: Recent Labs    11/07/21 2152 11/07/21 2235 11/08/21 0534 11/08/21 0922 11/08/21 2128 11/09/21 0620 11/09/21 1357 11/10/21 0149  HGB  --    < > 9.8*  --   --  9.7*  --  10.4*  HCT  --   --  32.4*  --   --  31.7*  --  33.1*  PLT  --   --  224  --   --  207  --  205  APTT  --    < >  --    < > 63* 123* 72* 94*  HEPARINUNFRC  --    < > >1.10*  --  0.79* 0.92*  --  0.50  CREATININE  --   --  0.75  --   --  0.55  --  0.58  TROPONINIHS 13  --   --   --   --   --   --   --    < > = values in this interval not displayed.    Estimated Creatinine Clearance: 72 mL/min (by C-G formula based on SCr of 0.58 mg/dL).  Assessment: 41 YOF presenting with abdominal pain, N/V, on Eliquis PTA for hx of DVT, last dose taken 1/5. Pharmacy consulted for IV heparin dosing while Eliquis is on hold.     aPTT therapeutic (94 seconds) this am on 1350 units/hr. Heparin level 0.50, also therapeutic and no longer falsely high due to recent Eliquis doses.   Goal of Therapy:  Heparin level 0.3-0.7 units/ml aPTT 66-102 seconds Monitor platelets by anticoagulation protocol: Yes   Plan:  Heparin drip held at 10:15am for colonoscopy this afternoon. Will follow up post-procedure for anticoagulation plans. Eliquis on hold since  admitted.  Arty Baumgartner, RPh 11/10/2021,11:48 AM

## 2021-11-10 NOTE — Interval H&P Note (Signed)
History and Physical Interval Note:  11/10/2021 1:25 PM  Diane [name]  has presented today for surgery, with the diagnosis of Abnormal CT scan.  The various methods of treatment have been discussed with the patient and family. After consideration of risks, benefits and other options for treatment, the patient has consented to  Procedure(s): COLONOSCOPY WITH PROPOFOL (N/A) as a surgical intervention.  The patient's history has been reviewed, patient examined, no change in status, stable for surgery.  I have reviewed the patient's chart and labs.  Questions were answered to the patient's satisfaction.     Gaynell Eggleton D

## 2021-11-10 NOTE — Progress Notes (Signed)
Discharge paperwork reviewed with patient. All questions answered. Patient waiting for family to come to bring clothes. Then will be transported to lobby.

## 2021-11-10 NOTE — Anesthesia Preprocedure Evaluation (Addendum)
Anesthesia Evaluation  Patient identified by MRN, date of birth, ID band Patient awake    Reviewed: Allergy & Precautions, NPO status , Patient's Chart, lab work & pertinent test results  Airway Mallampati: II  TM Distance: >3 FB Neck ROM: Full    Dental no notable dental hx. (+) Upper Dentures, Missing,    Pulmonary neg pulmonary ROS,    Pulmonary exam normal breath sounds clear to auscultation       Cardiovascular hypertension, Pt. on medications + Past MI (NSTEMI)  Normal cardiovascular exam Rhythm:Regular Rate:Normal  07/2021 TTE 1. Left ventricular ejection fraction, by estimation, is 60 to 65%. The  left ventricle has normal function. The left ventricle has no regional  wall motion abnormalities. Left ventricular diastolic parameters are  consistent with Grade I diastolic  dysfunction (impaired relaxation).  2. Right ventricular systolic function is normal. The right ventricular  size is normal. There is normal pulmonary artery systolic pressure.  3. The mitral valve is normal in structure. No evidence of mitral valve  regurgitation. No evidence of mitral stenosis.  4. The aortic valve is tricuspid. There is mild calcification of the  aortic valve. There is mild thickening of the aortic valve. Aortic valve  regurgitation is not visualized. Mild aortic valve sclerosis is present,  with no evidence of aortic valve  stenosis.  5. Aortic dilatation noted. There is borderline dilatation of the  ascending aorta, measuring 36 mm.  6. The inferior vena cava is normal in size with greater than 50%  respiratory variability, suggesting right atrial pressure of 3 mmHg.    Neuro/Psych  Neuromuscular disease    GI/Hepatic hiatal hernia, GERD  ,  Endo/Other  diabetes, Type 2  Renal/GU      Musculoskeletal  (+) Arthritis ,   Abdominal   Peds  Hematology  (+) anemia , Lab Results      Component                Value                Date                      WBC                      5.0                 11/10/2021                HGB                      10.4 (L)            11/10/2021                HCT                      33.1 (L)            11/10/2021                MCV                      84.4                11/10/2021                PLT  205                 11/10/2021              Anesthesia Other Findings   Reproductive/Obstetrics                            Anesthesia Physical Anesthesia Plan  ASA: 3  Anesthesia Plan: MAC   Post-op Pain Management:    Induction:   PONV Risk Score and Plan: Treatment may vary due to age or medical condition  Airway Management Planned: Natural Airway and Simple Face Mask  Additional Equipment: None  Intra-op Plan:   Post-operative Plan:   Informed Consent: I have reviewed the patients History and Physical, chart, labs and discussed the procedure including the risks, benefits and alternatives for the proposed anesthesia with the patient or authorized representative who has indicated his/her understanding and acceptance.     Dental advisory given  Plan Discussed with:   Anesthesia Plan Comments: (Colonscopy for Abd pain An NV)       Anesthesia Quick Evaluation

## 2021-11-11 ENCOUNTER — Encounter (HOSPITAL_COMMUNITY): Payer: Self-pay | Admitting: Gastroenterology

## 2021-11-11 LAB — SURGICAL PATHOLOGY

## 2021-11-12 ENCOUNTER — Other Ambulatory Visit: Payer: Self-pay | Admitting: *Deleted

## 2021-11-12 DIAGNOSIS — K5904 Chronic idiopathic constipation: Secondary | ICD-10-CM | POA: Diagnosis not present

## 2021-11-12 DIAGNOSIS — R519 Headache, unspecified: Secondary | ICD-10-CM | POA: Diagnosis not present

## 2021-11-12 NOTE — Patient Outreach (Signed)
[facility]) Care Management  11/12/2021  Diane [name] June 09, 1946 [id number]  Initial Referral Received 1/5 Pt d/c 1/10 hospital Telephone Assessment-Unsuccessful  RN attempted outreach call however unsuccessful. RN able to leave a HIPAA approved voice message requesting a call back.  RN will attempt another outreach call over the next week for pending services. Will also send outreach letter.  [name], RN Care Management Coordinator [place] Office [phone]

## 2021-11-16 DIAGNOSIS — M0579 Rheumatoid arthritis with rheumatoid factor of multiple sites without organ or systems involvement: Secondary | ICD-10-CM | POA: Diagnosis not present

## 2021-11-16 DIAGNOSIS — M7989 Other specified soft tissue disorders: Secondary | ICD-10-CM | POA: Diagnosis not present

## 2021-11-16 DIAGNOSIS — M79643 Pain in unspecified hand: Secondary | ICD-10-CM | POA: Diagnosis not present

## 2021-11-16 DIAGNOSIS — K760 Fatty (change of) liver, not elsewhere classified: Secondary | ICD-10-CM | POA: Diagnosis not present

## 2021-11-16 DIAGNOSIS — R252 Cramp and spasm: Secondary | ICD-10-CM | POA: Diagnosis not present

## 2021-11-16 DIAGNOSIS — D509 Iron deficiency anemia, unspecified: Secondary | ICD-10-CM | POA: Diagnosis not present

## 2021-11-16 DIAGNOSIS — M1990 Unspecified osteoarthritis, unspecified site: Secondary | ICD-10-CM | POA: Diagnosis not present

## 2021-11-16 DIAGNOSIS — Z79899 Other long term (current) drug therapy: Secondary | ICD-10-CM | POA: Diagnosis not present

## 2021-11-16 DIAGNOSIS — M349 Systemic sclerosis, unspecified: Secondary | ICD-10-CM | POA: Diagnosis not present

## 2021-11-17 ENCOUNTER — Other Ambulatory Visit: Payer: Self-pay | Admitting: *Deleted

## 2021-11-17 NOTE — Patient Outreach (Signed)
[place] [facility]) Care Management  11/17/2021  [place] 1946/06/21 [id number]   Telephone Assessment-Unsuccessful Outreach #2  RN attempted another outreach today however remains unsuccessful. RN able to leave a HIPAA approved voice message requesting a call back.  Will attempt another outreach call over the next week for pending [facility] services.  [name], RN Care Management Coordinator [place] Office [phone]

## 2021-11-24 ENCOUNTER — Other Ambulatory Visit: Payer: Self-pay | Admitting: *Deleted

## 2021-11-24 NOTE — Patient Outreach (Signed)
[name]) Care Management  11/24/2021  Diane [name] 04-05-1946 [id number]  RN attempted to call pt today however unsuccessful. RN able to leave a HIPAA voice message requesting a call back.  RN also contact the primary provider with a message for the pt.  Will outreach once again over the next month for pending [facility] services.  [name], RN Care Management Coordinator [place] Office [phone]

## 2021-12-01 DIAGNOSIS — E1165 Type 2 diabetes mellitus with hyperglycemia: Secondary | ICD-10-CM | POA: Diagnosis not present

## 2021-12-01 DIAGNOSIS — M1990 Unspecified osteoarthritis, unspecified site: Secondary | ICD-10-CM | POA: Diagnosis not present

## 2021-12-01 DIAGNOSIS — E782 Mixed hyperlipidemia: Secondary | ICD-10-CM | POA: Diagnosis not present

## 2021-12-01 DIAGNOSIS — I10 Essential (primary) hypertension: Secondary | ICD-10-CM | POA: Diagnosis not present

## 2021-12-18 ENCOUNTER — Encounter: Payer: Self-pay | Admitting: *Deleted

## 2021-12-22 ENCOUNTER — Inpatient Hospital Stay (HOSPITAL_COMMUNITY)
Admission: EM | Admit: 2021-12-22 | Discharge: 2021-12-25 | DRG: 389 | Disposition: A | Discharge status: Home / Self Care | Payer: Medicare Other | Attending: Internal Medicine | Admitting: Internal Medicine

## 2021-12-22 ENCOUNTER — Emergency Department (HOSPITAL_COMMUNITY): Payer: Medicare Other

## 2021-12-22 DIAGNOSIS — R933 Abnormal findings on diagnostic imaging of other parts of digestive tract: Secondary | ICD-10-CM | POA: Diagnosis not present

## 2021-12-22 DIAGNOSIS — Z961 Presence of intraocular lens: Secondary | ICD-10-CM | POA: Diagnosis present

## 2021-12-22 DIAGNOSIS — M79604 Pain in right leg: Secondary | ICD-10-CM | POA: Diagnosis not present

## 2021-12-22 DIAGNOSIS — R9431 Abnormal electrocardiogram [ECG] [EKG]: Secondary | ICD-10-CM | POA: Diagnosis present

## 2021-12-22 DIAGNOSIS — R1031 Right lower quadrant pain: Secondary | ICD-10-CM | POA: Diagnosis not present

## 2021-12-22 DIAGNOSIS — I5032 Chronic diastolic (congestive) heart failure: Secondary | ICD-10-CM | POA: Diagnosis not present

## 2021-12-22 DIAGNOSIS — Z86718 Personal history of other venous thrombosis and embolism: Secondary | ICD-10-CM

## 2021-12-22 DIAGNOSIS — K5909 Other constipation: Secondary | ICD-10-CM | POA: Diagnosis present

## 2021-12-22 DIAGNOSIS — Z9049 Acquired absence of other specified parts of digestive tract: Secondary | ICD-10-CM | POA: Diagnosis not present

## 2021-12-22 DIAGNOSIS — Z20822 Contact with and (suspected) exposure to covid-19: Secondary | ICD-10-CM | POA: Diagnosis not present

## 2021-12-22 DIAGNOSIS — I110 Hypertensive heart disease with heart failure: Secondary | ICD-10-CM | POA: Diagnosis not present

## 2021-12-22 DIAGNOSIS — I252 Old myocardial infarction: Secondary | ICD-10-CM

## 2021-12-22 DIAGNOSIS — K5900 Constipation, unspecified: Secondary | ICD-10-CM | POA: Diagnosis not present

## 2021-12-22 DIAGNOSIS — H269 Unspecified cataract: Secondary | ICD-10-CM | POA: Diagnosis present

## 2021-12-22 DIAGNOSIS — Z6831 Body mass index (BMI) 31.0-31.9, adult: Secondary | ICD-10-CM

## 2021-12-22 DIAGNOSIS — E785 Hyperlipidemia, unspecified: Secondary | ICD-10-CM | POA: Diagnosis not present

## 2021-12-22 DIAGNOSIS — Z7984 Long term (current) use of oral hypoglycemic drugs: Secondary | ICD-10-CM | POA: Diagnosis not present

## 2021-12-22 DIAGNOSIS — Z888 Allergy status to other drugs, medicaments and biological substances status: Secondary | ICD-10-CM

## 2021-12-22 DIAGNOSIS — K560 Paralytic ileus: Principal | ICD-10-CM | POA: Diagnosis present

## 2021-12-22 DIAGNOSIS — Z043 Encounter for examination and observation following other accident: Secondary | ICD-10-CM | POA: Diagnosis not present

## 2021-12-22 DIAGNOSIS — M79671 Pain in right foot: Secondary | ICD-10-CM | POA: Diagnosis not present

## 2021-12-22 DIAGNOSIS — Z7901 Long term (current) use of anticoagulants: Secondary | ICD-10-CM | POA: Diagnosis not present

## 2021-12-22 DIAGNOSIS — M7989 Other specified soft tissue disorders: Secondary | ICD-10-CM | POA: Diagnosis not present

## 2021-12-22 DIAGNOSIS — E11649 Type 2 diabetes mellitus with hypoglycemia without coma: Secondary | ICD-10-CM | POA: Diagnosis not present

## 2021-12-22 DIAGNOSIS — K6389 Other specified diseases of intestine: Secondary | ICD-10-CM | POA: Diagnosis not present

## 2021-12-22 DIAGNOSIS — K567 Ileus, unspecified: Secondary | ICD-10-CM | POA: Diagnosis not present

## 2021-12-22 DIAGNOSIS — Z79631 Long term (current) use of antimetabolite agent: Secondary | ICD-10-CM | POA: Diagnosis not present

## 2021-12-22 DIAGNOSIS — R109 Unspecified abdominal pain: Secondary | ICD-10-CM | POA: Diagnosis not present

## 2021-12-22 DIAGNOSIS — E876 Hypokalemia: Secondary | ICD-10-CM | POA: Diagnosis not present

## 2021-12-22 DIAGNOSIS — K5904 Chronic idiopathic constipation: Secondary | ICD-10-CM | POA: Diagnosis not present

## 2021-12-22 DIAGNOSIS — R188 Other ascites: Secondary | ICD-10-CM | POA: Diagnosis not present

## 2021-12-22 DIAGNOSIS — Z90710 Acquired absence of both cervix and uterus: Secondary | ICD-10-CM

## 2021-12-22 DIAGNOSIS — Z96611 Presence of right artificial shoulder joint: Secondary | ICD-10-CM | POA: Diagnosis present

## 2021-12-22 DIAGNOSIS — E1140 Type 2 diabetes mellitus with diabetic neuropathy, unspecified: Secondary | ICD-10-CM | POA: Diagnosis present

## 2021-12-22 DIAGNOSIS — K219 Gastro-esophageal reflux disease without esophagitis: Secondary | ICD-10-CM | POA: Diagnosis not present

## 2021-12-22 DIAGNOSIS — R112 Nausea with vomiting, unspecified: Secondary | ICD-10-CM | POA: Diagnosis not present

## 2021-12-22 DIAGNOSIS — M069 Rheumatoid arthritis, unspecified: Secondary | ICD-10-CM | POA: Diagnosis not present

## 2021-12-22 DIAGNOSIS — I2510 Atherosclerotic heart disease of native coronary artery without angina pectoris: Secondary | ICD-10-CM | POA: Diagnosis present

## 2021-12-22 DIAGNOSIS — Z9842 Cataract extraction status, left eye: Secondary | ICD-10-CM

## 2021-12-22 DIAGNOSIS — I10 Essential (primary) hypertension: Secondary | ICD-10-CM | POA: Diagnosis not present

## 2021-12-22 DIAGNOSIS — E860 Dehydration: Secondary | ICD-10-CM | POA: Diagnosis present

## 2021-12-22 DIAGNOSIS — R1084 Generalized abdominal pain: Secondary | ICD-10-CM | POA: Diagnosis not present

## 2021-12-22 DIAGNOSIS — Z79899 Other long term (current) drug therapy: Secondary | ICD-10-CM

## 2021-12-22 DIAGNOSIS — Z885 Allergy status to narcotic agent status: Secondary | ICD-10-CM

## 2021-12-22 LAB — URINALYSIS, ROUTINE W REFLEX MICROSCOPIC
Bacteria, UA: NONE SEEN
Bilirubin Urine: NEGATIVE
Glucose, UA: 50 mg/dL — AB
Hgb urine dipstick: NEGATIVE
Ketones, ur: 5 mg/dL — AB
Leukocytes,Ua: NEGATIVE
Nitrite: NEGATIVE
Protein, ur: 100 mg/dL — AB
Specific Gravity, Urine: 1.017 (ref 1.005–1.030)
pH: 5 (ref 5.0–8.0)

## 2021-12-22 LAB — CBG MONITORING, ED
Glucose-Capillary: 111 mg/dL — ABNORMAL HIGH (ref 70–99)
Glucose-Capillary: 103 mg/dL — ABNORMAL HIGH (ref 70–99)
Glucose-Capillary: 86 mg/dL (ref 70–99)

## 2021-12-22 LAB — COMPREHENSIVE METABOLIC PANEL
ALT: 14 U/L (ref 0–44)
AST: 19 U/L (ref 15–41)
Albumin: 4 g/dL (ref 3.5–5.0)
Alkaline Phosphatase: 66 U/L (ref 38–126)
Anion gap: 12 (ref 5–15)
BUN: 11 mg/dL (ref 8–23)
CO2: 24 mmol/L (ref 22–32)
Calcium: 9.6 mg/dL (ref 8.9–10.3)
Chloride: 103 mmol/L (ref 98–111)
Creatinine, Ser: 0.79 mg/dL (ref 0.44–1.00)
GFR, Estimated: >60 mL/min (ref >60)
Glucose, Bld: 223 mg/dL — ABNORMAL HIGH (ref 70–99)
Potassium: 4.1 mmol/L (ref 3.5–5.1)
Sodium: 139 mmol/L (ref 135–145)
Total Bilirubin: 0.4 mg/dL (ref 0.3–1.2)
Total Protein: 7.9 g/dL (ref 6.5–8.1)

## 2021-12-22 LAB — CBC
HCT: 43.9 % (ref 36.0–46.0)
Hemoglobin: 13.7 g/dL (ref 12.0–15.0)
MCH: 26.3 pg (ref 26.0–34.0)
MCHC: 31.2 g/dL (ref 30.0–36.0)
MCV: 84.3 fL (ref 80.0–100.0)
Platelets: 293 10*3/uL (ref 150–400)
RBC: 5.21 MIL/uL — ABNORMAL HIGH (ref 3.87–5.11)
RDW: 17.9 % — ABNORMAL HIGH (ref 11.5–15.5)
WBC: 11.5 10*3/uL — ABNORMAL HIGH (ref 4.0–10.5)
nRBC: 0 % (ref 0.0–0.2)

## 2021-12-22 LAB — RESP PANEL BY RT-PCR (FLU A&B, COVID) ARPGX2
Influenza A by PCR: NEGATIVE
Influenza B by PCR: NEGATIVE
SARS Coronavirus 2 by RT PCR: NEGATIVE

## 2021-12-22 LAB — LIPASE, BLOOD: Lipase: 35 U/L (ref 11–51)

## 2021-12-22 LAB — APTT: aPTT: 79 seconds — ABNORMAL HIGH (ref 24–36)

## 2021-12-22 LAB — HEPARIN LEVEL (UNFRACTIONATED): Heparin Unfractionated: >1.1 IU/mL — ABNORMAL HIGH (ref 0.30–0.70)

## 2021-12-22 IMAGING — CT CT ABD-PELV W/ CM
2 of 5 series · 16 of 46 positions shown, 18 images · IV contrast (APPLIED)
Comparison: 11/06/2021

CLINICAL DATA: Abdominal pain and nausea and vomiting for 2 days.
Suspected bowel obstruction.

EXAM:
CT ABDOMEN AND PELVIS WITH CONTRAST
TECHNIQUE: Multidetector CT imaging of the abdomen and pelvis was performed
using the standard protocol following bolus administration of
intravenous contrast.

[Series 3: abdomen 5.0 · axial · 0.87mm/px · z∈[+610,+1060]mm · 13 of 102 slices shown, 15 images]
[im 6/102  soft-tissue]
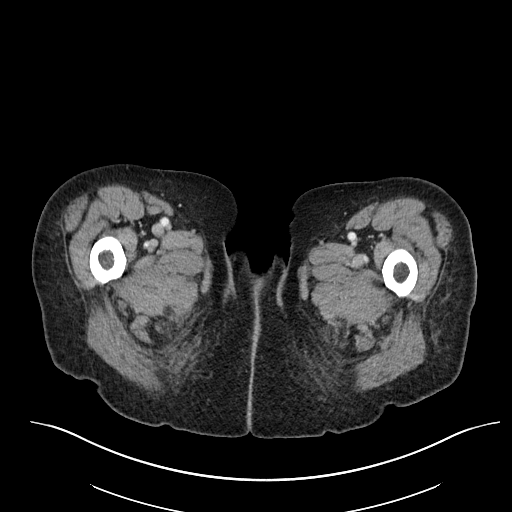
[im 6/102  bone]
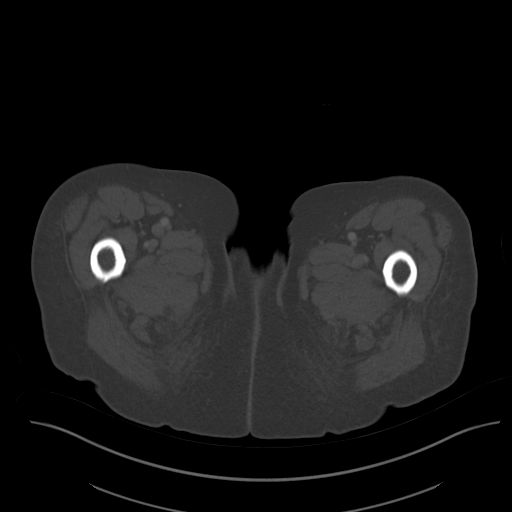
[im 12/102  soft-tissue]
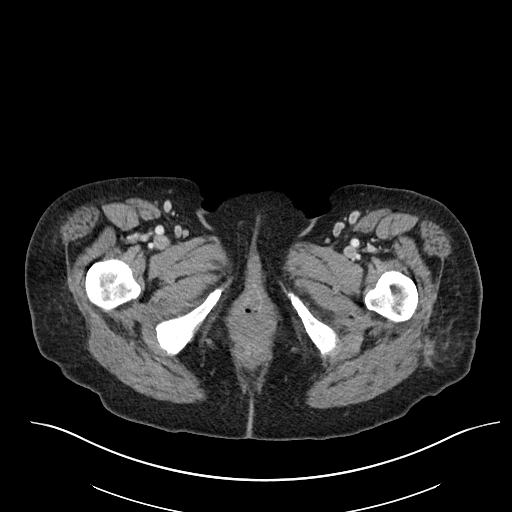
[im 24/102  soft-tissue]
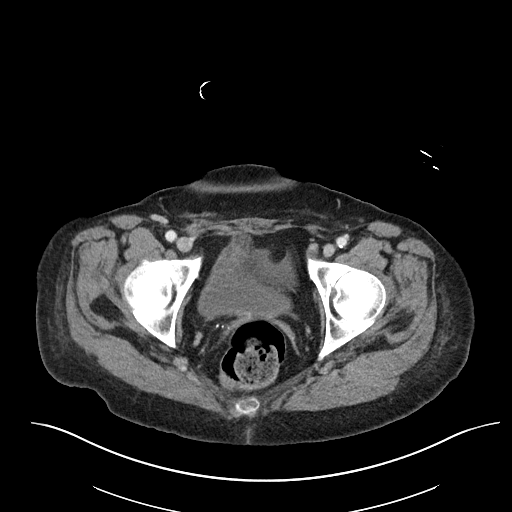
[im 30/102  soft-tissue]
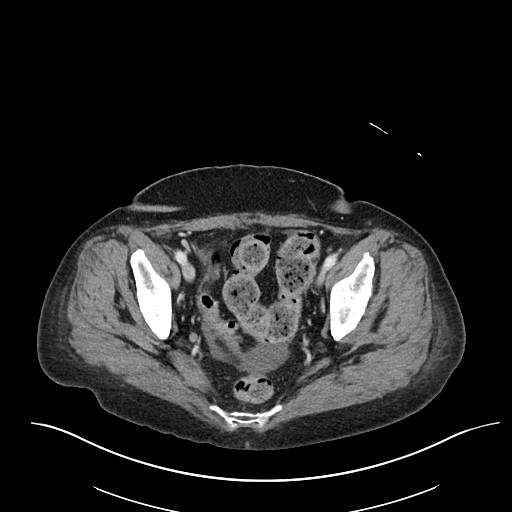
[im 36/102  soft-tissue]
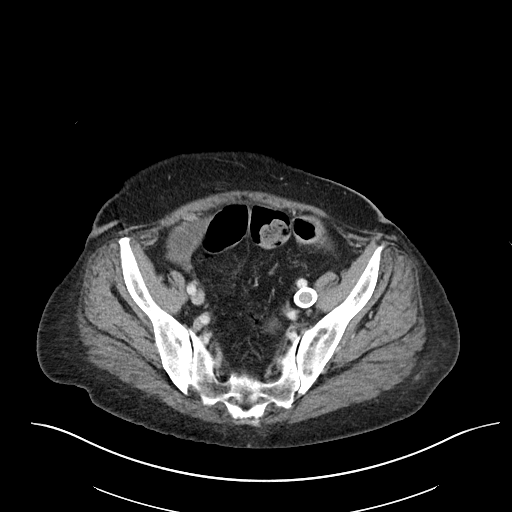
[im 42/102  soft-tissue]
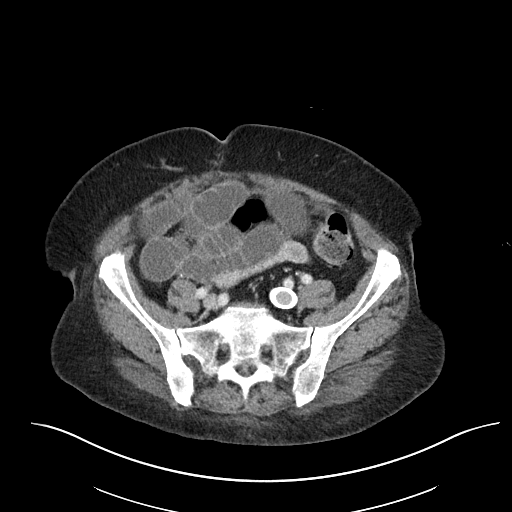
[im 54/102  soft-tissue]
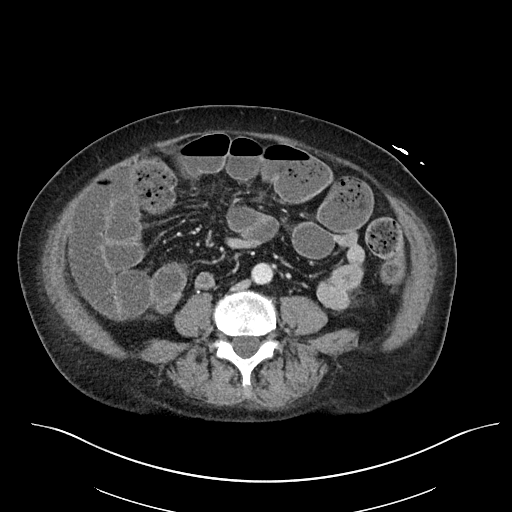
[im 60/102  soft-tissue]
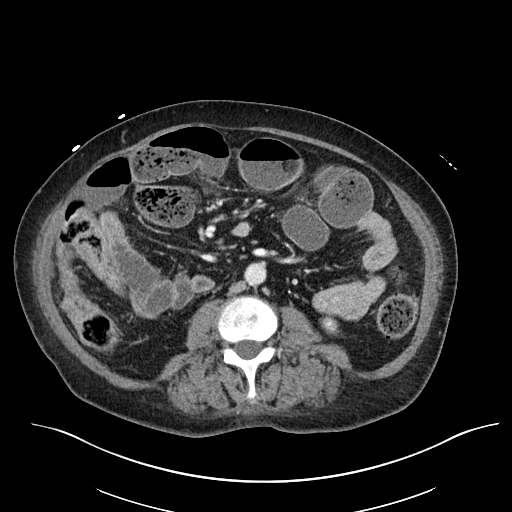
[im 66/102  soft-tissue]
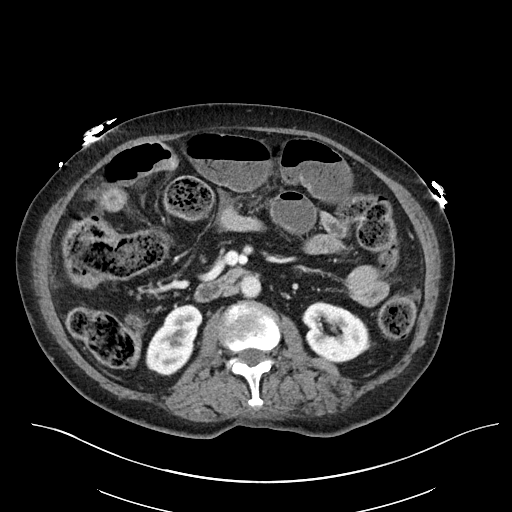
[im 66/102  bone]
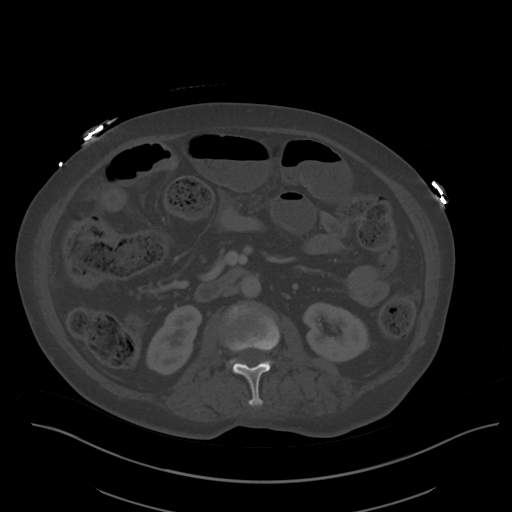
[im 72/102  soft-tissue]
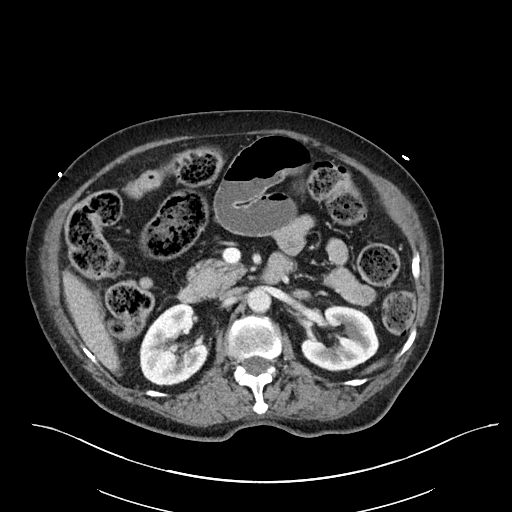
[im 78/102  soft-tissue]
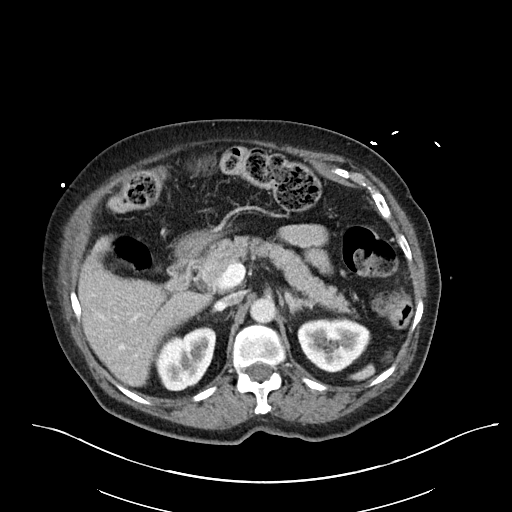
[im 90/102  soft-tissue]
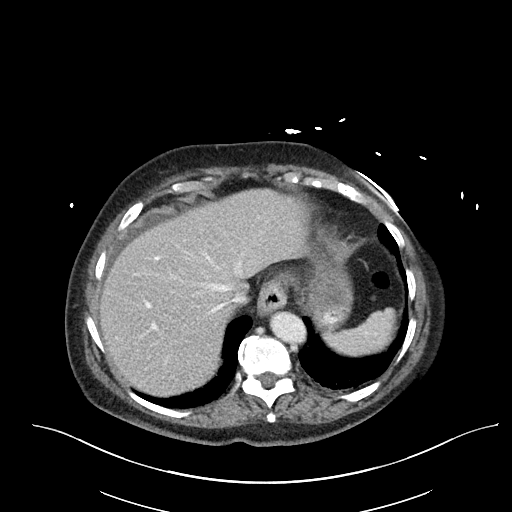
[im 96/102  soft-tissue]
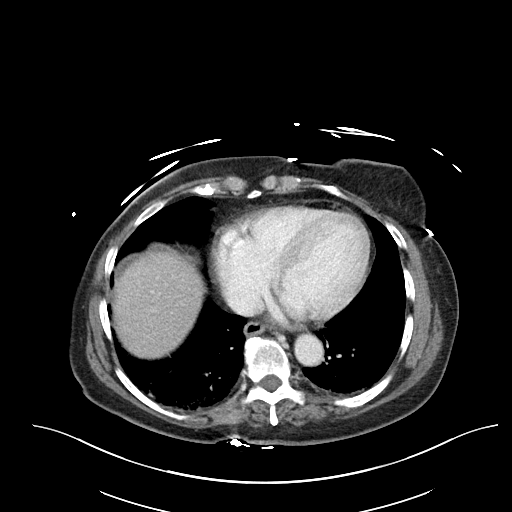

[Series 6: abdomen 3.0 mpr cor · coronal · 0.77mm/px · 3 of 102 slices shown]
[im 34/102  soft-tissue]
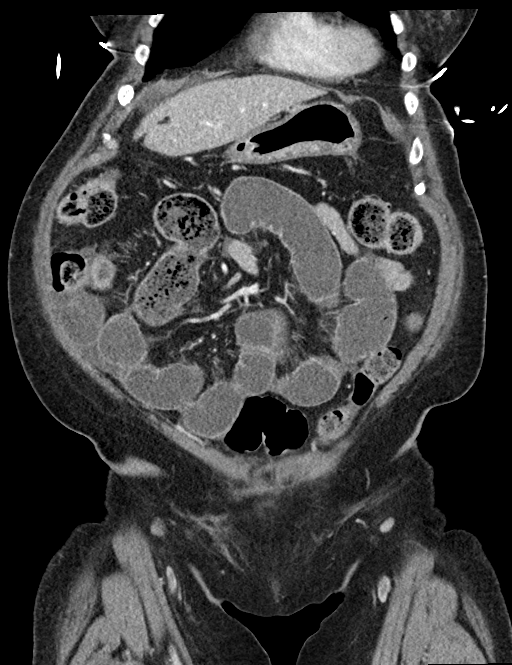
[im 45/102  soft-tissue]
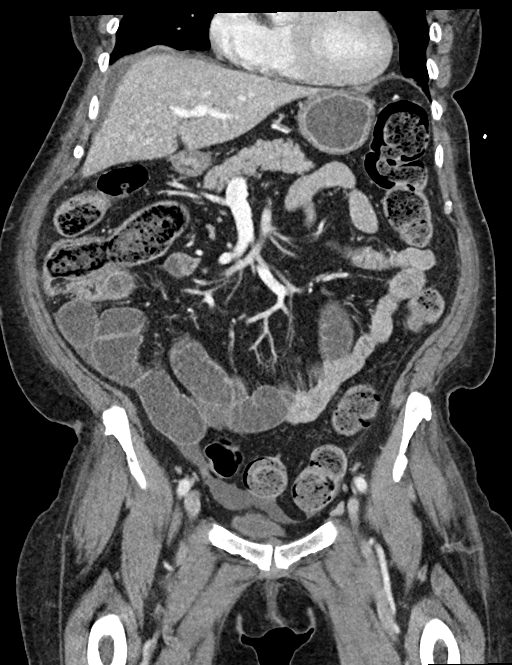
[im 57/102  soft-tissue]
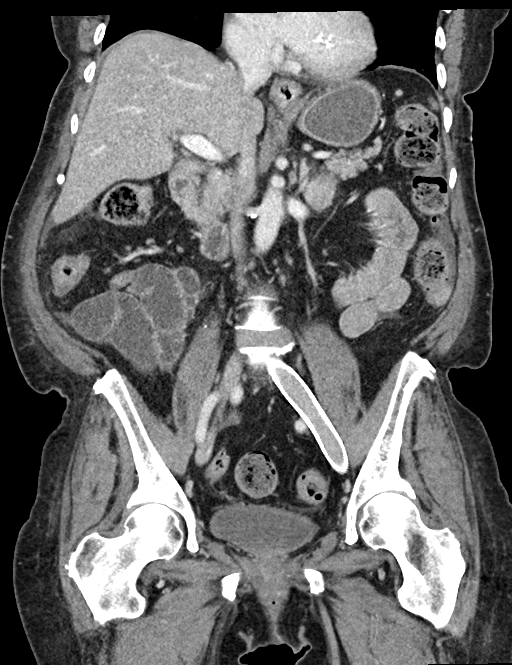

[16 of 46 positions shown; findings below may reference images not displayed]

RADIATION DOSE REDUCTION: This exam was performed according to the
departmental dose-optimization program which includes automated
exposure control, adjustment of the mA and/or kV according to
patient size and/or use of iterative reconstruction technique.

CONTRAST:  100mL OMNIPAQUE IOHEXOL 300 MG/ML  SOLN
FINDINGS: Lower Chest: No acute findings.

Hepatobiliary: No hepatic masses identified. Prior cholecystectomy.
No evidence of biliary obstruction.

Pancreas:  No mass or inflammatory changes.

Spleen: Within normal limits in size and appearance.

Adrenals/Urinary Tract: No masses identified. No evidence of
ureteral calculi or hydronephrosis.

Stomach/Bowel: Moderate stool again seen throughout the colon,
however the colon is nondilated. Moderate diffuse small bowel
dilatation is seen containing air-fluid levels, without significant
change since previous study. Fecal content is seen within distal
small bowel loops including the terminal ileum, also without
significant change. Normal appendix visualized. No focal
inflammatory process identified.

Vascular/Lymphatic: No pathologically enlarged lymph nodes. No acute
vascular findings. Stent again seen within the left common iliac
vein.

Reproductive: Prior hysterectomy noted. Adnexal regions are
unremarkable in appearance.

Other: Mild ascites in the right upper quadrant and pelvis shows
mild decrease since previous study.

Musculoskeletal:  No suspicious bone lesions identified.
IMPRESSION: No significant change in moderate small bowel dilatation with fecal
content in distal ileum, and moderate stool burden throughout
nondilated colon. Although no etiology for mechanical small-bowel
obstruction is seen, differential diagnosis includes functional
small bowel obstruction due to stool and adynamic ileus.

Mild ascites, slightly decreased since previous study.

## 2021-12-22 MED ORDER — LINACLOTIDE 145 MCG PO CAPS
145.0000 ug | ORAL_CAPSULE | Freq: Every day | ORAL | Status: DC
  Administered 2021-12-23: 145 ug via ORAL
  Filled 2021-12-22: qty 1

## 2021-12-22 MED ORDER — DICYCLOMINE HCL 10 MG/ML IM SOLN
20.0000 mg | Freq: Once | INTRAMUSCULAR | Status: AC
  Administered 2021-12-22: 20 mg via INTRAMUSCULAR
  Filled 2021-12-22 (×2): qty 2

## 2021-12-22 MED ORDER — INSULIN ASPART 100 UNIT/ML IJ SOLN: 0.0000 [IU] | Freq: Three times a day (TID) | INTRAMUSCULAR | Status: DC

## 2021-12-22 MED ORDER — ROSUVASTATIN CALCIUM 5 MG PO TABS
10.0000 mg | ORAL_TABLET | Freq: Every day | ORAL | Status: DC
  Administered 2021-12-22 – 2021-12-25 (×4): 10 mg via ORAL
  Filled 2021-12-22 (×4): qty 2

## 2021-12-22 MED ORDER — ONDANSETRON HCL 4 MG/2ML IJ SOLN
4.0000 mg | Freq: Once | INTRAMUSCULAR | Status: AC
  Administered 2021-12-22: 4 mg via INTRAVENOUS
  Filled 2021-12-22: qty 2

## 2021-12-22 MED ORDER — MORPHINE SULFATE (PF) 4 MG/ML IV SOLN
4.0000 mg | Freq: Once | INTRAVENOUS | Status: AC
  Administered 2021-12-22: 4 mg via INTRAVENOUS
  Filled 2021-12-22: qty 1

## 2021-12-22 MED ORDER — MORPHINE SULFATE (PF) 2 MG/ML IV SOLN: 2.0000 mg | INTRAVENOUS | Status: DC | PRN

## 2021-12-22 MED ORDER — ACETAMINOPHEN 650 MG RE SUPP: 650.0000 mg | Freq: Four times a day (QID) | RECTAL | Status: DC | PRN

## 2021-12-22 MED ORDER — DICYCLOMINE HCL 10 MG/ML IM SOLN: 20.0000 mg | Freq: Once | INTRAMUSCULAR | Status: DC

## 2021-12-22 MED ORDER — IRBESARTAN 300 MG PO TABS
300.0000 mg | ORAL_TABLET | Freq: Every day | ORAL | Status: DC
Start: 2021-12-22 — End: 2021-12-25
  Administered 2021-12-22 – 2021-12-25 (×4): 300 mg via ORAL
  Filled 2021-12-22 (×5): qty 1

## 2021-12-22 MED ORDER — HYDRALAZINE HCL 20 MG/ML IJ SOLN: 10.0000 mg | INTRAMUSCULAR | Status: DC | PRN

## 2021-12-22 MED ORDER — LACTATED RINGERS IV BOLUS
1000.0000 mL | Freq: Once | INTRAVENOUS | Status: AC
  Administered 2021-12-22: 1000 mL via INTRAVENOUS

## 2021-12-22 MED ORDER — POLYETHYLENE GLYCOL 3350 17 GM/SCOOP PO POWD: 1.0000 | Freq: Once | ORAL | Status: DC

## 2021-12-22 MED ORDER — PROCHLORPERAZINE EDISYLATE 10 MG/2ML IJ SOLN: 10.0000 mg | INTRAMUSCULAR | Status: DC | PRN

## 2021-12-22 MED ORDER — LACTATED RINGERS IV SOLN: INTRAVENOUS | Status: DC

## 2021-12-22 MED ORDER — FLEET ENEMA 7-19 GM/118ML RE ENEM
2.0000 | ENEMA | Freq: Once | RECTAL | Status: AC
  Administered 2021-12-22: 2 via RECTAL
  Filled 2021-12-22: qty 2

## 2021-12-22 MED ORDER — OXYCODONE HCL 5 MG PO TABS
5.0000 mg | ORAL_TABLET | ORAL | Status: DC | PRN
  Administered 2021-12-23 – 2021-12-24 (×3): 10 mg via ORAL
  Filled 2021-12-22 (×3): qty 2

## 2021-12-22 MED ORDER — PEG-KCL-NACL-NASULF-NA ASC-C 100 G PO SOLR
0.5000 | Freq: Once | ORAL | Status: DC
Start: 2021-12-22 — End: 2021-12-22
  Filled 2021-12-22: qty 1

## 2021-12-22 MED ORDER — GLYCERIN (LAXATIVE) 2 G RE SUPP
1.0000 | Freq: Once | RECTAL | Status: DC
Start: 2021-12-22 — End: 2021-12-22
  Filled 2021-12-22: qty 1

## 2021-12-22 MED ORDER — AMLODIPINE BESYLATE 5 MG PO TABS
5.0000 mg | ORAL_TABLET | Freq: Every day | ORAL | Status: DC
  Administered 2021-12-22 – 2021-12-25 (×4): 5 mg via ORAL
  Filled 2021-12-22 (×4): qty 1

## 2021-12-22 MED ORDER — POLYETHYLENE GLYCOL 3350 17 G PO PACK
17.0000 g | PACK | Freq: Three times a day (TID) | ORAL | Status: DC
  Administered 2021-12-22 – 2021-12-25 (×9): 17 g via ORAL
  Filled 2021-12-22 (×9): qty 1

## 2021-12-22 MED ORDER — IOHEXOL 300 MG/ML  SOLN
100.0000 mL | Freq: Once | INTRAMUSCULAR | Status: AC | PRN
  Administered 2021-12-22: 100 mL via INTRAVENOUS

## 2021-12-22 MED ORDER — FOLIC ACID 1 MG PO TABS
1.0000 mg | ORAL_TABLET | Freq: Every day | ORAL | Status: DC
  Administered 2021-12-22 – 2021-12-25 (×4): 1 mg via ORAL
  Filled 2021-12-22 (×4): qty 1

## 2021-12-22 MED ORDER — DULOXETINE HCL 20 MG PO CPEP
20.0000 mg | ORAL_CAPSULE | Freq: Every morning | ORAL | Status: DC
  Administered 2021-12-22 – 2021-12-25 (×4): 20 mg via ORAL
  Filled 2021-12-22 (×4): qty 1

## 2021-12-22 MED ORDER — OXYBUTYNIN CHLORIDE ER 5 MG PO TB24
5.0000 mg | ORAL_TABLET | Freq: Every day | ORAL | Status: DC
Start: 2021-12-22 — End: 2021-12-23
  Administered 2021-12-22: 5 mg via ORAL
  Filled 2021-12-22 (×2): qty 1

## 2021-12-22 MED ORDER — ASPIRIN EC 81 MG PO TBEC
81.0000 mg | DELAYED_RELEASE_TABLET | Freq: Every day | ORAL | Status: DC
Start: 2021-12-22 — End: 2021-12-25
  Administered 2021-12-22 – 2021-12-25 (×4): 81 mg via ORAL
  Filled 2021-12-22 (×4): qty 1

## 2021-12-22 MED ORDER — ACETAMINOPHEN 325 MG PO TABS: 650.0000 mg | ORAL_TABLET | Freq: Four times a day (QID) | ORAL | Status: DC | PRN

## 2021-12-22 MED ORDER — HEPARIN (PORCINE) 25000 UT/250ML-% IV SOLN
1350.0000 [IU]/h | INTRAVENOUS | Status: AC
  Administered 2021-12-22: 1350 [IU]/h via INTRAVENOUS
  Filled 2021-12-22 (×2): qty 250

## 2021-12-22 NOTE — Assessment & Plan Note (Signed)
-  Recent A1c was 6.5, indicating good control -Hold metformin -Will cover with moderate-scale SSI
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
## 2021-12-22 NOTE — Assessment & Plan Note (Signed)
-  Continue Norvasc, Micardis -Will add prn IV hydralazine

## 2021-12-22 NOTE — Assessment & Plan Note (Addendum)
-  Patient with recurrent admissions for adynamic ileus/SBO/constipation -Regardless, she continues to have flares despite good bowel regimen at home -She is presenting with acute onset of abdominal pain with n/v, abdominal distention, and CT findings c/w SBO vs. Adynamic ileus -Will admit to Med Surg -Gen Surg consulted; she appears to be a patient who would benefit from operative intervention given her multiple recurrences despite appropriate management -If no operative intervention, then will need improved bowel regimen.  Will ask Dr. Adriana Mccallum to see. -NPO for bowel rest -Consider NG tube, although she is not currently vomting -IVF hydration -Pain control with Tylenol/oxycodone/morphine (although minimize narcotics since this will make constipation worse)
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
## 2021-12-22 NOTE — Assessment & Plan Note (Signed)
-  Likely associated with n/v, mild dehydration -Will attempt to avoid QT-prolonging medications, but that is difficult with antiemetics -Will use Compazine for now -Recheck EKG in AM

## 2021-12-22 NOTE — Assessment & Plan Note (Deleted)
-  Patient with recurrent admissions for SBO, possibly adynamic ileus -Regardless, she continues to have flares despite good bowel regimen at home -She is presenting with acute onset of abdominal pain with n/v, abdominal distention, and CT findings c/w SBO vs. Adynamic ileus -Will admit to Med Surg -Gen Surg consulted; she appears to be a patient who would benefit from operative intervention given her multiple recurrences despite appropriate management -NPO for bowel rest -Consider NG tube, although she is not currently vomting -IVF hydration -Pain control with Tylenol/oxycodone/morphine

## 2021-12-22 NOTE — Progress Notes (Signed)
[facility] for heparin Indication: DVT  Allergies  Allergen Reactions   Cephalexin Other (See Comments)    URINARY RETENTION = "blocked my urine"   Tapentadol     Other reaction(s): zombie Other reaction(s): zombie   Codeine Hives and Rash   Nucynta [Tapentadol Hcl] Other (See Comments)    Altered mental status    Patient Measurements:   Heparin Dosing Weight:   Vital Signs: BP: 168/75 (02/21 2245) Pulse Rate: 85 (02/21 2245)  Labs: Recent Labs    12/22/21 0718 12/22/21 2201  HGB 13.7  --   HCT 43.9  --   PLT 293  --   APTT  --  79*  HEPARINUNFRC  --  >1.10*  CREATININE 0.79  --      CrCl cannot be calculated (Unknown ideal weight.).   Assessment: 41 yof presented to the ED with abdominal pain. To start IV heparin for history of VTE. She is on chronic apixaban with her last dose being yesterd. Baseline CBC is WNL. No bleeding noted.   PTT 79 seconds  Goal of Therapy:  Heparin level 0.3-0.7 units/ml aPTT 66-103 seconds Monitor platelets by anticoagulation protocol: Yes   Plan:  Heparin gtt 1350 units/hr - recently therapeutic on this rate Daily heparin level, aPTT and CBC  Tad Moore 12/22/2021,11:09 PM

## 2021-12-22 NOTE — Assessment & Plan Note (Signed)
-  She had DVT s/p thrombectomy in April and July of last year -She will need lifelong AC -Hold Eliquis for now -Will start heparin drip

## 2021-12-22 NOTE — Assessment & Plan Note (Signed)
-  Hold methotrexate for now

## 2021-12-22 NOTE — ED Provider Notes (Signed)
[facility] EMERGENCY DEPARTMENT Provider Note   CSN: [id number] Arrival date & time: 12/22/21  0174     History  Chief Complaint  Patient presents with   Abdominal Pain   Nausea   Emesis    Diane [name] is a 76 y.o. female.  Pt is a 75y/o female with hx of hypertension, NSTEMI, left DVT s/p mechanical thrombectomy 06/01/21 on chronic anticoagulation, DM, history of SBO, chronic constipation, and GERD who is presenting today with complaints of nausea vomiting and diffuse abdominal pain.  Patient reports her symptoms started yesterday evening and have been persistent all night long.  The last episode of emesis was this morning around 5:30 AM.  She has not passed any gas since last night but reports having a normal caliber stool yesterday morning.  She is continue to use MiraLAX.  She denies any new food exposure.  No urinary symptoms no fever, chest pain or shortness of breath.  She reports unfortunately this happens to her often was last hospitalized in January of this year for possible ileus and severe constipation requiring multiple enemas.  At that time she had a colonoscopy that showed 3-4 polyps that were removed and diffuse diverticulosis.  She went home with persistent use of medications for constipation and had been doing well till yesterday.  The history is provided by the patient.  Abdominal Pain Associated symptoms: vomiting   Emesis Associated symptoms: abdominal pain       Home Medications Prior to Admission medications   Medication Sig Start Date End Date Taking? Authorizing Provider  acetaminophen (TYLENOL) 500 MG tablet Take 1,000 mg by mouth every 6 (six) hours as needed for moderate pain or headache.    [provider]  amLODipine (NORVASC) 5 MG tablet Take 5 mg by mouth daily.    [provider]  apixaban (ELIQUIS) 5 MG TABS tablet Take 5 mg by mouth 2 (two) times daily.    [provider]  aspirin EC 81 MG tablet Take  81 mg by mouth daily. Swallow whole.    [provider]  docusate sodium (COLACE) 100 MG capsule Take 1 capsule (100 mg total) by mouth 2 (two) times daily. 05/27/21   Lavina Hamman, MD  DULoxetine (CYMBALTA) 20 MG capsule Take 20 mg by mouth every morning. 04/30/21   [provider]  folic acid (FOLVITE) 1 MG tablet Take 1 tablet (1 mg total) by mouth daily. 02/26/21   Medina-Vargas, Monina C, NP  furosemide (LASIX) 20 MG tablet Take 20 mg by mouth daily. 09/10/21   [provider]  gabapentin (NEURONTIN) 300 MG capsule Take 300 mg by mouth at bedtime.    [provider]  metFORMIN (GLUCOPHAGE-XR) 500 MG 24 hr tablet Take 1,000 mg by mouth daily with breakfast.    [provider]  methotrexate (RHEUMATREX) 2.5 MG tablet Take 8 tablets (20 mg total) by mouth once a week. 8 tablets weekly (Sunday) Patient taking differently: Take 20 mg by mouth every Sunday. 8 tabs 02/26/21   Medina-Vargas, Monina C, NP  naproxen sodium (ALEVE) 220 MG tablet Take 440 mg by mouth daily as needed (pain).    [provider]  oxybutynin (DITROPAN-XL) 5 MG 24 hr tablet Take 5 mg by mouth daily. 08/12/21   [provider]  pantoprazole (PROTONIX) 40 MG tablet Take 1 tablet (40 mg total) by mouth daily. 07/03/21   Patrecia Pour, MD  polyethylene glycol (MIRALAX / GLYCOLAX) 17 g packet  Take 17 g by mouth 2 (two) times daily. Patient taking differently: Take 17 g by mouth daily as needed for mild constipation. 05/27/21   Lavina Hamman, MD  rosuvastatin (CRESTOR) 10 MG tablet Take 1 tablet (10 mg total) by mouth daily. 02/26/21   Medina-Vargas, Monina C, NP  senna-docusate (SENOKOT-S) 8.6-50 MG tablet Take 1 tablet by mouth at bedtime. 11/10/21   Caren Griffins, MD  simethicone (MYLICON) 80 MG chewable tablet Chew 1 tablet (80 mg total) by mouth 4 (four) times daily. Patient taking differently: Chew 80 mg by mouth every 6 (six) hours as needed for flatulence. 05/27/21    Lavina Hamman, MD  telmisartan (MICARDIS) 80 MG tablet Take 80 mg by mouth at bedtime. 10/12/21   [provider]      Allergies    Cephalexin, Tapentadol, Codeine, and Nucynta [tapentadol hcl]    Review of Systems   Review of Systems  Gastrointestinal:  Positive for abdominal pain and vomiting.   Physical Exam Updated Vital Signs BP (!) 168/93    Pulse 91    Temp 99 F (37.2 C) (Oral)    Resp 15    SpO2 98%  Physical Exam Vitals and nursing note reviewed.  Constitutional:      General: She is not in acute distress.    Appearance: She is well-developed.  HENT:     Head: Normocephalic and atraumatic.  Eyes:     Pupils: Pupils are equal, round, and reactive to light.  Cardiovascular:     Rate and Rhythm: Normal rate and regular rhythm.     Heart sounds: Normal heart sounds. No murmur heard.   No friction rub.  Pulmonary:     Effort: Pulmonary effort is normal.     Breath sounds: Normal breath sounds. No wheezing or rales.  Abdominal:     General: Bowel sounds are decreased. There is no distension.     Palpations: Abdomen is soft.     Tenderness: There is generalized abdominal tenderness. There is no guarding or rebound.  Musculoskeletal:        General: No tenderness. Normal range of motion.     Comments: No edema  Skin:    General: Skin is warm and dry.     Findings: No rash.  Neurological:     Mental Status: She is alert and oriented to person, place, and time.     Cranial Nerves: No cranial nerve deficit.  Psychiatric:        Behavior: Behavior normal.    ED Results / Procedures / Treatments   Labs (all labs ordered are listed, but only abnormal results are displayed) Labs Reviewed  COMPREHENSIVE METABOLIC PANEL - Abnormal; Notable for the following components:      Result Value   Glucose, Bld 223 (*)    All other components within normal limits  CBC - Abnormal; Notable for the following components:   WBC 11.5 (*)    RBC 5.21 (*)    RDW 17.9  (*)    All other components within normal limits  URINALYSIS, ROUTINE W REFLEX MICROSCOPIC - Abnormal; Notable for the following components:   Glucose, UA 50 (*)    Ketones, ur 5 (*)    Protein, ur 100 (*)    All other components within normal limits  LIPASE, BLOOD    EKG EKG Interpretation  Date/Time:  Tuesday December 22 2021 08:03:30 EST Ventricular Rate:  91 PR Interval:  139 QRS Duration: 95 QT Interval:  458 QTC Calculation: 564 R Axis:   41 Text Interpretation: Sinus rhythm Borderline T abnormalities, anterior leads new  Prolonged QT interval Confirmed by Blanchie Dessert [phone]) on 12/22/2021 8:18:26 AM  Radiology CT ABDOMEN PELVIS W CONTRAST  Result Date: 12/22/2021 CLINICAL DATA:  Abdominal pain and nausea and vomiting for 2 days. Suspected bowel obstruction. EXAM: CT ABDOMEN AND PELVIS WITH CONTRAST TECHNIQUE: Multidetector CT imaging of the abdomen and pelvis was performed using the standard protocol following bolus administration of intravenous contrast. RADIATION DOSE REDUCTION: This exam was performed according to the departmental dose-optimization program which includes automated exposure control, adjustment of the mA and/or kV according to patient size and/or use of iterative reconstruction technique. CONTRAST:  132mL OMNIPAQUE IOHEXOL 300 MG/ML  SOLN COMPARISON:  11/06/2021 FINDINGS: Lower Chest: No acute findings. Hepatobiliary: No hepatic masses identified. Prior cholecystectomy. No evidence of biliary obstruction. Pancreas:  No mass or inflammatory changes. Spleen: Within normal limits in size and appearance. Adrenals/Urinary Tract: No masses identified. No evidence of ureteral calculi or hydronephrosis. Stomach/Bowel: Moderate stool again seen throughout the colon, however the colon is nondilated. Moderate diffuse small bowel dilatation is seen containing air-fluid levels, without significant change since previous study. Fecal content is seen within distal small bowel  loops including the terminal ileum, also without significant change. Normal appendix visualized. No focal inflammatory process identified. Vascular/Lymphatic: No pathologically enlarged lymph nodes. No acute vascular findings. Stent again seen within the left common iliac vein. Reproductive: Prior hysterectomy noted. Adnexal regions are unremarkable in appearance. Other: Mild ascites in the right upper quadrant and pelvis shows mild decrease since previous study. Musculoskeletal:  No suspicious bone lesions identified. IMPRESSION: No significant change in moderate small bowel dilatation with fecal content in distal ileum, and moderate stool burden throughout nondilated colon. Although no etiology for mechanical small-bowel obstruction is seen, differential diagnosis includes functional small bowel obstruction due to stool and adynamic ileus. Mild ascites, slightly decreased since previous study. Electronically Signed   By: Marlaine Hind M.D.   On: 12/22/2021 10:12    Procedures Procedures    Medications Ordered in ED Medications  dicyclomine (BENTYL) injection 20 mg (has no administration in time range)  amLODipine (NORVASC) tablet 5 mg (has no administration in time range)  polyethylene glycol powder (GLYCOLAX/MIRALAX) container 255 g (has no administration in time range)  Glycerin (Adult) 2 g suppository 1 suppository (has no administration in time range)  lactated ringers bolus 1,000 mL (1,000 mLs Intravenous New Bag/Given 12/22/21 0817)  ondansetron (ZOFRAN) injection 4 mg (4 mg Intravenous Given 12/22/21 0817)  morphine (PF) 4 MG/ML injection 4 mg (4 mg Intravenous Given 12/22/21 0818)  iohexol (OMNIPAQUE) 300 MG/ML solution 100 mL (100 mLs Intravenous Contrast Given 12/22/21 3354)    ED Course/ Medical Decision Making/ A&P                           Medical Decision Making Amount and/or Complexity of Data Reviewed External Data Reviewed: radiology and notes. Labs: ordered. Decision-making  details documented in ED Course. Radiology: ordered and independent interpretation performed. Decision-making details documented in ED Course. ECG/medicine tests: ordered and independent interpretation performed. Decision-making details documented in ED Course.  Risk OTC drugs. Prescription drug management. Decision regarding hospitalization.   Patient is a 76 year old female presenting today with nausea vomiting and abdominal pain.  This is a recurrent issue for her with prior history of small bowel obstruction as well has prior history of severe constipation  requiring multiple enemas and resulting in ileus.  Patient has diffuse abdominal tenderness mostly in the right upper quadrant and in the past she has had issues around her ileocecal valve.  She denies any urinary complaints and no infectious complaints at this time.  Symptoms started yesterday and have been persistent.  She was given IV fluids, antiemetic, labs are pending.  External medical records from her last hospitalization were reviewed from January at which time she had to have multiple enemas, clear liquid diet, IV fluids and then had a colonoscopy where she had polyps removed but ultimately was discharged home with a aggressive bowel regime.  10:40 AM I independently interpreted patient's labs and EKG.  EKG with no acute findings today, CBC, CMP, lipase are all within normal limits except for mild leukocytosis of 11, UA without significant findings.  I independently viewed and interpreted patient's CT of her abdomen pelvis which shows no evidence of renal stones or pyelonephritis.  Radiology reported moderate small bowel dilation with fecal content in the distal ileum and moderate stool burden throughout the nondilated colon although no etiology for mechanical small bowel obstruction is seen it could be functional small bowel obstruction due to stool and adynamic ileus.  Patient's symptoms suggest this.  On repeat evaluation her nausea  has improved with medication but she still has significant abdominal discomfort and bloating.  She has no stool in the rectum concerning for fecal impaction at this time.  Reviewing notes from prior hospitalization and gastroenterology she did have a colonoscopy that showed polyps in the cecum but no evidence of mass that is causing obstruction.  Suspect she had again has a ileus from severe constipation.  She was started on a slow bowel prep but she has been taking MiraLAX daily daily and will most likely need adjustment of her home regime to prevent recurrence of her symptoms.  She is on multiple heart medications as well as Lasix which might be causing her constipation.  No perforation or abscesses at this time.  Findings discussed with the patient and her daughter.  At this time patient does not feel like she can eat or drink anything and concerned that she would have recurrent symptoms if she went home at this time.  Feel that she meets admission criteria for bowel prep, improvement of symptoms prior to discharge.        Final Clinical Impression(s) / ED Diagnoses Final diagnoses:  Adynamic ileus ([place])  Ileus of unspecified type ([facility])  Constipation, unspecified constipation type    Rx / DC Orders ED Discharge Orders     None         Blanchie Dessert, MD 12/22/21 1044

## 2021-12-22 NOTE — ED Notes (Signed)
This RN attempted blood draw and NT attempted x2 for heparin level and aPTT. IV team consult placed

## 2021-12-22 NOTE — Assessment & Plan Note (Signed)
Continue Crestor 

## 2021-12-22 NOTE — ED Triage Notes (Signed)
Pt. Stated, Diane [name] had N/V and stomach pain for 2 days.
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
## 2021-12-22 NOTE — Progress Notes (Signed)
ANTICOAGULATION CONSULT NOTE - Initial Consult  Pharmacy Consult for heparin Indication: DVT  Allergies  Allergen Reactions   Cephalexin Other (See Comments)    URINARY RETENTION = "blocked my urine"   Tapentadol     Other reaction(s): zombie Other reaction(s): zombie   Codeine Hives and Rash   Nucynta [Tapentadol Hcl] Other (See Comments)    Altered mental status    Patient Measurements:   Heparin Dosing Weight:   Vital Signs: Temp: 99 F (37.2 C) (02/21 0705) Temp Source: Oral (02/21 0705) BP: 173/92 (02/21 1102) Pulse Rate: 91 (02/21 0954)  Labs: Recent Labs    12/22/21 0718  HGB 13.7  HCT 43.9  PLT 293  CREATININE 0.79    CrCl cannot be calculated (Unknown ideal weight.).   Medical History: Past Medical History:  Diagnosis Date   Abnormal nuclear stress test 10/18/2013   Arm pain 10/18/2013   Arthritis    Cataract    RIGHT EYE   Chest pain 10/18/2013   Diabetes mellitus    dx over 5 yrs ago   GERD (gastroesophageal reflux disease)    Heart murmur    "yrs ago in [place] -- been here since 1999, "   History of hiatal hernia    Hypertension    Prolonged Q-T interval on ECG 04/20/2017   Pulmonary edema 04/20/2017    Medications:  Infusions:   heparin     lactated ringers      Assessment: 5 yof presented to the ED with abdominal pain. To start IV heparin for history of VTE. She is on chronic apixaban with her last dose being yesterd. Baseline CBC is WNL. No bleeding noted.   Goal of Therapy:  Heparin level 0.3-0.7 units/ml aPTT 66-103 seconds Monitor platelets by anticoagulation protocol: Yes   Plan:  Heparin gtt 1350 units/hr - recently therapeutic on this rate Check an 8 hr heparin level/aPTT Daily heparin level, aPTT and CBC  Diane [name], Diane [name] 12/22/2021,12:03 PM

## 2021-12-23 ENCOUNTER — Encounter (HOSPITAL_COMMUNITY): Payer: Self-pay | Admitting: Internal Medicine

## 2021-12-23 ENCOUNTER — Other Ambulatory Visit: Payer: Self-pay

## 2021-12-23 ENCOUNTER — Inpatient Hospital Stay (HOSPITAL_COMMUNITY): Payer: Medicare Other

## 2021-12-23 LAB — CBC
HCT: 33.8 % — ABNORMAL LOW (ref 36.0–46.0)
Hemoglobin: 10.4 g/dL — ABNORMAL LOW (ref 12.0–15.0)
MCH: 26.5 pg (ref 26.0–34.0)
MCHC: 30.8 g/dL (ref 30.0–36.0)
MCV: 86 fL (ref 80.0–100.0)
Platelets: 213 10*3/uL (ref 150–400)
RBC: 3.93 MIL/uL (ref 3.87–5.11)
RDW: 17.6 % — ABNORMAL HIGH (ref 11.5–15.5)
WBC: 5.2 10*3/uL (ref 4.0–10.5)
nRBC: 0 % (ref 0.0–0.2)

## 2021-12-23 LAB — GLUCOSE, CAPILLARY
Glucose-Capillary: 124 mg/dL — ABNORMAL HIGH (ref 70–99)
Glucose-Capillary: 68 mg/dL — ABNORMAL LOW (ref 70–99)
Glucose-Capillary: 89 mg/dL (ref 70–99)
Glucose-Capillary: 93 mg/dL (ref 70–99)
Glucose-Capillary: 95 mg/dL (ref 70–99)

## 2021-12-23 LAB — HEPARIN LEVEL (UNFRACTIONATED): Heparin Unfractionated: >1.1 [IU]/mL — ABNORMAL HIGH (ref 0.30–0.70)

## 2021-12-23 LAB — CBG MONITORING, ED: Glucose-Capillary: 93 mg/dL (ref 70–99)

## 2021-12-23 LAB — BASIC METABOLIC PANEL WITH GFR
Anion gap: 11 (ref 5–15)
BUN: 11 mg/dL (ref 8–23)
CO2: 21 mmol/L — ABNORMAL LOW (ref 22–32)
Calcium: 8.2 mg/dL — ABNORMAL LOW (ref 8.9–10.3)
Chloride: 107 mmol/L (ref 98–111)
Creatinine, Ser: 0.48 mg/dL (ref 0.44–1.00)
GFR, Estimated: >60 mL/min
Glucose, Bld: 125 mg/dL — ABNORMAL HIGH (ref 70–99)
Potassium: 3 mmol/L — ABNORMAL LOW (ref 3.5–5.1)
Sodium: 139 mmol/L (ref 135–145)

## 2021-12-23 LAB — MAGNESIUM: Magnesium: 1.6 mg/dL — ABNORMAL LOW (ref 1.7–2.4)

## 2021-12-23 LAB — APTT: aPTT: 98 seconds — ABNORMAL HIGH (ref 24–36)

## 2021-12-23 IMAGING — DX DG ABDOMEN 1V
1 series · 1 of 1 positions shown · non-contrast
Comparison: 12/22/2021

CLINICAL DATA: Fall ileus.

EXAM:
ABDOMEN - 1 VIEW

[abdomen]
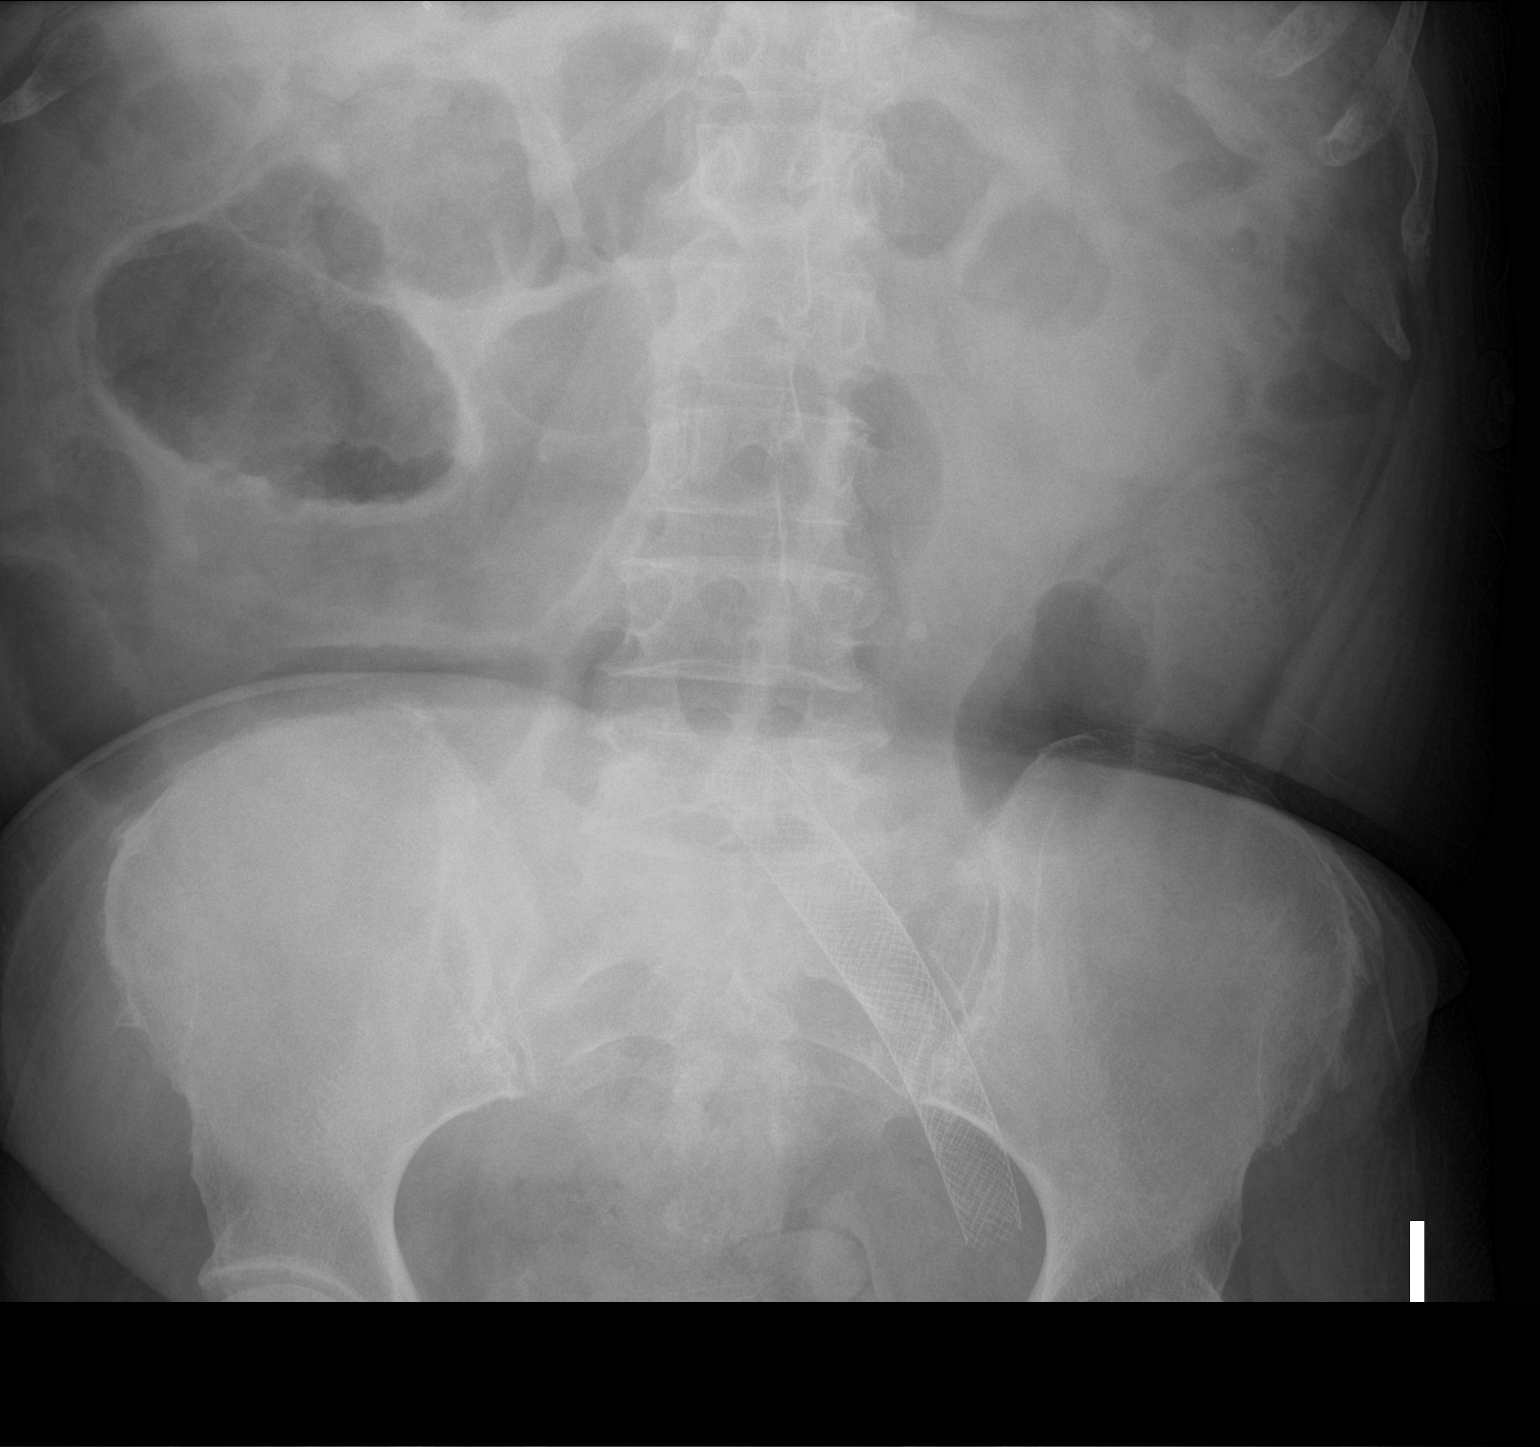

[1 of 1 positions shown; findings below may reference images not displayed]

FINDINGS: There is gaseous distension of large and small bowel within the
abdomen, which appears similar to slightly improved compared to the
previous CT, when accounting for differences in technique. No gross
free intraperitoneal air on AP portable supine view. Left iliac
vascular stent.
IMPRESSION: Persistent gaseous distension of large and small bowel within the
abdomen, similar to slightly improved compared to previous CT.

## 2021-12-23 MED ORDER — DEXTROSE 50 % IV SOLN
12.5000 g | INTRAVENOUS | Status: AC
  Administered 2021-12-23: 12.5 g via INTRAVENOUS

## 2021-12-23 MED ORDER — BISACODYL 10 MG RE SUPP
10.0000 mg | Freq: Every day | RECTAL | Status: DC
Start: 2021-12-23 — End: 2021-12-25
  Administered 2021-12-23 – 2021-12-25 (×3): 10 mg via RECTAL
  Filled 2021-12-23 (×3): qty 1

## 2021-12-23 MED ORDER — POTASSIUM CHLORIDE CRYS ER 20 MEQ PO TBCR
40.0000 meq | EXTENDED_RELEASE_TABLET | ORAL | Status: AC
  Administered 2021-12-23 (×2): 40 meq via ORAL
  Filled 2021-12-23 (×2): qty 2

## 2021-12-23 MED ORDER — DEXTROSE-NACL 5-0.9 % IV SOLN: INTRAVENOUS | Status: DC

## 2021-12-23 MED ORDER — LINACLOTIDE 145 MCG PO CAPS
290.0000 ug | ORAL_CAPSULE | Freq: Every day | ORAL | Status: DC
  Administered 2021-12-24 – 2021-12-25 (×2): 290 ug via ORAL
  Filled 2021-12-23 (×2): qty 2

## 2021-12-23 MED ORDER — DEXTROSE 50 % IV SOLN
INTRAVENOUS | Status: AC
Start: 2021-12-23 — End: 2021-12-23
  Filled 2021-12-23: qty 50

## 2021-12-23 MED ORDER — BETHANECHOL CHLORIDE 5 MG PO TABS
5.0000 mg | ORAL_TABLET | Freq: Three times a day (TID) | ORAL | Status: DC
  Administered 2021-12-23 – 2021-12-25 (×6): 5 mg via ORAL
  Filled 2021-12-23 (×8): qty 1

## 2021-12-23 MED ORDER — SORBITOL 70 % SOLN
200.0000 mL | TOPICAL_OIL | Freq: Once | ORAL | Status: AC
  Administered 2021-12-23: 200 mL via RECTAL
  Filled 2021-12-23 (×2): qty 60

## 2021-12-23 MED ORDER — APIXABAN 5 MG PO TABS
5.0000 mg | ORAL_TABLET | Freq: Two times a day (BID) | ORAL | Status: DC
  Administered 2021-12-23 – 2021-12-25 (×5): 5 mg via ORAL
  Filled 2021-12-23 (×5): qty 1

## 2021-12-23 NOTE — ED Notes (Signed)
Pt received second enema. Pt currently sitting on bedpan

## 2021-12-23 NOTE — Consult Note (Signed)
° °  [facility] Inpatient Consult   12/23/2021  CABRIA MICALIZZI 17-Dec-1945 [id number]  [facility] [ACO] Patient: [employer] Medicare  Primary Care Provider:  [name], MD , is an embedded provider with a Chronic Care Management team and program, and is listed for the transition of care follow up and appointments.  Patient was screened for pending activity with [facility] Coordinator.  RN CC had difficulty contacting patient prior to admission for high risk for unplanned readmission follow up. Spoke with [facility] RN to update that patient has an Embedded provider with an Embedded program listed. Patient arrived on the unit and resting at this time.  Plan: Follow for transitional needs for post hospital care Embedded verses [facility].  Please contact for further questions,  Natividad Brood, RN BSN [facility] Liaison  [phone] business mobile phone Toll free office [phone]  Fax number: [phone] [email or web address]

## 2021-12-23 NOTE — Progress Notes (Signed)
PROGRESS NOTE    MARSIA CINO  UYQ:[id number] DOB: 03-21-1946 DOA: 12/22/2021 PCP: [name], MD   Brief Narrative: HPI per Dr. Lorin Mercy on 12/22/2021  Jenna Luo Double is a 76 y.o. female with medical history significant of DM; HTN; CAD; RA; DVT s/p mechanical thrombectomy on 06/01/21 on chronic Eliquis; and chronic diastolic CHF presenting with abdominal pain with n/v.  She was previously admitted from 1/6-10 with similar issues.  GI was consulted and she received enemas and was placed on an aggressive bowel regimen with improvement.   She has had ongoing problems in the last month - no vomiting but would have cramps.  She started vomiting yesterday and suffered with cramps all night.  She has been taking stool softener and Miralax, has had soft stools.  She couldn't sleep night before last due to leg cramps.  She has a lot of abdominal distention.  She has changed her diet, eating less, sometimes fruit and vegetables.  She reports that this is her 10th hospitalization for this issue since March 2022 - Epic shows admission 3/24-4/1 with GI and surgery consulting; DVT admission 4/6-12 with thrombectomy and thrombolysis; admission from 7/22-27 for SBO; recurrent DVT with thrombectomy 7/30-8/5; and ER visit for constipation on 11/13.  Her next visit was her most recent hospitalization. Assessment & Plan:   Principal Problem:   Adynamic ileus ([place]) Active Problems:   Hypertension   Prolonged Q-T interval on ECG   Type 2 diabetes mellitus with diabetic neuropathy, unspecified (HCC)   Rheumatoid arthritis ([place])   History of DVT (deep vein thrombosis)   Hyperlipidemia, unspecified   #1 adynamic ileus with recurrent hospital admissions for the same. Patient admitted with abdominal pain nausea vomiting abdominal distention and CT findings consistent with adynamic ileus versus SBO.  Patient has not had any nausea vomiting today. Continue slow IV hydration. N.p.o. for now Avoid narcotics as much  as possible. Patient seen by general surgery and GI. No surgical intervention needed time per general surgery. Will increase MiraLAX to 3 times daily Continue Linzess higher dose daily Smog enema Dulcolax suppository Follow-up KUB today Minimize narcotics and correct electrolytes Increase activity out of bed Will discontinue Ditropan as it have anticholinergic effects.  Will try bethanechol low-dose as it also improves gastric motility. DC morphine As needed oxycodone only for severe pain Colonoscopy January 2023 showed only 3 polyps the cecum.  No evidence of stricture was noted.  #2 history of DVT patient had thrombectomy in April and July of last year.  Restart Eliquis. DC heparin.  #3 history of rheumatoid arthritis on methotrexate  #4 type 2 diabetes complicated with neuropathy on metformin at home Continue to hold metformin Patient was hypoglycemic started on D5 normal saline at 75 cc an hour. CBG (last 3)  Recent Labs    12/23/21 0833 12/23/21 1152 12/23/21 1225  GLUCAP 93 68* 93    #5 history of essential hypertension on Norvasc and Micardis continue as needed hydralazine.  Lasix on hold.  #6 prolonged QT-improving QTc down to 476 from 574.  Continue to avoid QT prolonging agents as much as possible.    Estimated body mass index is 31.32 kg/m as calculated from the following:   Height as of this encounter: 5\' 8"  (1.727 m).   Weight as of this encounter: 93.4 kg.  DVT prophylaxis: Eliquis  code Status: Full code Family Communication: None at bedside  disposition Plan:  Status is: Inpatient Remains inpatient appropriate because: Ileus on IV fluids patient  n.p.o   Consultants:  GI  Procedures: None Antimicrobials: None  Subjective: Patient is resting in bed she had just gotten her enema while I was in the room.  Later the nurse reported she had a BM after the enema.  She also is hypoglycemic D5 NS started.  Objective: Vitals:   12/23/21 0828 12/23/21  0828 12/23/21 1108 12/23/21 1151  BP: (!) 153/82  (!) 166/81 (!) 172/75  Pulse: 76  76 80  Resp: 10  11 16   Temp: 98.2 F (36.8 C)  98.5 F (36.9 C) 98.1 F (36.7 C)  TempSrc: Oral   Oral  SpO2: 100%  98% 96%  Weight:  93.4 kg    Height:  5\' 8"  (1.727 m)      Intake/Output Summary (Last 24 hours) at 12/23/2021 1358 Last data filed at 12/23/2021 1002 Gross per 24 hour  Intake 269.72 ml  Output 1 ml  Net 268.72 ml   Filed Weights   12/23/21 0828  Weight: 93.4 kg    Examination:  General exam: Appears in mild distress  respiratory system: Clear to auscultation. Respiratory effort normal. Cardiovascular system: S1 & S2 heard, RRR. No JVD, murmurs, rubs, gallops or clicks. No pedal edema. Gastrointestinal system: Abdomen is distended, soft and upper right-sided tender. No organomegaly or masses felt. Normal bowel sounds heard. Central nervous system: Alert and oriented. No focal neurological deficits. Extremities: Symmetric 5 x 5 power. Skin: No rashes, lesions or ulcers Psychiatry: Judgement and insight appear normal. Mood & affect appropriate.     Data Reviewed: I have personally reviewed following labs and imaging studies  CBC: Recent Labs  Lab 12/22/21 0718 12/23/21 0330  WBC 11.5* 5.2  HGB 13.7 10.4*  HCT 43.9 33.8*  MCV 84.3 86.0  PLT 293 595   Basic Metabolic Panel: Recent Labs  Lab 12/22/21 0718 12/23/21 0330  NA 139 139  K 4.1 3.0*  CL 103 107  CO2 24 21*  GLUCOSE 223* 125*  BUN 11 11  CREATININE 0.79 0.48  CALCIUM 9.6 8.2*  MG  --  1.6*   GFR: Estimated Creatinine Clearance: 72.6 mL/min (by C-G formula based on SCr of 0.48 mg/dL). Liver Function Tests: Recent Labs  Lab 12/22/21 0718  AST 19  ALT 14  ALKPHOS 66  BILITOT 0.4  PROT 7.9  ALBUMIN 4.0   Recent Labs  Lab 12/22/21 0718  LIPASE 35   No results for input(s): AMMONIA in the last 168 hours. Coagulation Profile: No results for input(s): INR, PROTIME in the last 168  hours. Cardiac Enzymes: No results for input(s): CKTOTAL, CKMB, CKMBINDEX, TROPONINI in the last 168 hours. BNP (last 3 results) No results for input(s): PROBNP in the last 8760 hours. HbA1C: No results for input(s): HGBA1C in the last 72 hours. CBG: Recent Labs  Lab 12/22/21 1645 12/22/21 2143 12/23/21 0833 12/23/21 1152 12/23/21 1225  GLUCAP 103* 86 93 68* 93   Lipid Profile: No results for input(s): CHOL, HDL, LDLCALC, TRIG, CHOLHDL, LDLDIRECT in the last 72 hours. Thyroid Function Tests: No results for input(s): TSH, T4TOTAL, FREET4, T3FREE, THYROIDAB in the last 72 hours. Anemia Panel: No results for input(s): VITAMINB12, FOLATE, FERRITIN, TIBC, IRON, RETICCTPCT in the last 72 hours. Sepsis Labs: No results for input(s): PROCALCITON, LATICACIDVEN in the last 168 hours.  Recent Results (from the past 240 hour(s))  Resp Panel by RT-PCR (Flu A&B, Covid) Nasopharyngeal Swab     Status: None   Collection Time: 12/22/21 10:20 PM   Specimen:  Nasopharyngeal Swab; Nasopharyngeal(NP) swabs in vial transport medium  Result Value Ref Range Status   SARS Coronavirus 2 by RT PCR NEGATIVE NEGATIVE Final    Comment: (NOTE) SARS-CoV-2 target nucleic acids are NOT DETECTED.  The SARS-CoV-2 RNA is generally detectable in upper respiratory specimens during the acute phase of infection. The lowest concentration of SARS-CoV-2 viral copies this assay can detect is 138 copies/mL. A negative result does not preclude SARS-Cov-2 infection and should not be used as the sole basis for treatment or other patient management decisions. A negative result may occur with  improper specimen collection/handling, submission of specimen other than nasopharyngeal swab, presence of viral mutation(s) within the areas targeted by this assay, and inadequate number of viral copies(<138 copies/mL). A negative result must be combined with clinical observations, patient history, and epidemiological information.  The expected result is Negative.  Fact Sheet for Patients:  EntrepreneurPulse.com.au  Fact Sheet for Healthcare Providers:  IncredibleEmployment.be  This test is no t yet approved or cleared by the [place] FDA and  has been authorized for detection and/or diagnosis of SARS-CoV-2 by FDA under an Emergency Use Authorization (EUA). This EUA will remain  in effect (meaning this test can be used) for the duration of the COVID-19 declaration under Section 564(b)(1) of the Act, 21 U.S.C.section 360bbb-3(b)(1), unless the authorization is terminated  or revoked sooner.       Influenza A by PCR NEGATIVE NEGATIVE Final   Influenza B by PCR NEGATIVE NEGATIVE Final    Comment: (NOTE) The Xpert Xpress SARS-CoV-2/FLU/RSV plus assay is intended as an aid in the diagnosis of influenza from Nasopharyngeal swab specimens and should not be used as a sole basis for treatment. Nasal washings and aspirates are unacceptable for Xpert Xpress SARS-CoV-2/FLU/RSV testing.  Fact Sheet for Patients: EntrepreneurPulse.com.au  Fact Sheet for Healthcare Providers: IncredibleEmployment.be  This test is not yet approved or cleared by the [place] FDA and has been authorized for detection and/or diagnosis of SARS-CoV-2 by FDA under an Emergency Use Authorization (EUA). This EUA will remain in effect (meaning this test can be used) for the duration of the COVID-19 declaration under Section 564(b)(1) of the Act, 21 U.S.C. section 360bbb-3(b)(1), unless the authorization is terminated or revoked.  Performed at [facility], [place] [address], [place], [place] [address]          Radiology Studies: CT ABDOMEN PELVIS W CONTRAST  Result Date: 12/22/2021 CLINICAL DATA:  Abdominal pain and nausea and vomiting for 2 days. Suspected bowel obstruction. EXAM: CT ABDOMEN AND PELVIS WITH CONTRAST TECHNIQUE: Multidetector CT  imaging of the abdomen and pelvis was performed using the standard protocol following bolus administration of intravenous contrast. RADIATION DOSE REDUCTION: This exam was performed according to the departmental dose-optimization program which includes automated exposure control, adjustment of the mA and/or kV according to patient size and/or use of iterative reconstruction technique. CONTRAST:  175mL OMNIPAQUE IOHEXOL 300 MG/ML  SOLN COMPARISON:  11/06/2021 FINDINGS: Lower Chest: No acute findings. Hepatobiliary: No hepatic masses identified. Prior cholecystectomy. No evidence of biliary obstruction. Pancreas:  No mass or inflammatory changes. Spleen: Within normal limits in size and appearance. Adrenals/Urinary Tract: No masses identified. No evidence of ureteral calculi or hydronephrosis. Stomach/Bowel: Moderate stool again seen throughout the colon, however the colon is nondilated. Moderate diffuse small bowel dilatation is seen containing air-fluid levels, without significant change since previous study. Fecal content is seen within distal small bowel loops including the terminal ileum, also without significant change. Normal  appendix visualized. No focal inflammatory process identified. Vascular/Lymphatic: No pathologically enlarged lymph nodes. No acute vascular findings. Stent again seen within the left common iliac vein. Reproductive: Prior hysterectomy noted. Adnexal regions are unremarkable in appearance. Other: Mild ascites in the right upper quadrant and pelvis shows mild decrease since previous study. Musculoskeletal:  No suspicious bone lesions identified. IMPRESSION: No significant change in moderate small bowel dilatation with fecal content in distal ileum, and moderate stool burden throughout nondilated colon. Although no etiology for mechanical small-bowel obstruction is seen, differential diagnosis includes functional small bowel obstruction due to stool and adynamic ileus. Mild ascites, slightly  decreased since previous study. Electronically Signed   By: Marlaine Hind M.D.   On: 12/22/2021 10:12        Scheduled Meds:  amLODipine  5 mg Oral Daily   apixaban  5 mg Oral BID   aspirin EC  81 mg Oral Daily   dextrose       DULoxetine  20 mg Oral q morning   folic acid  1 mg Oral Daily   insulin aspart  0-15 Units Subcutaneous TID WC   irbesartan  300 mg Oral Daily   linaclotide  145 mcg Oral QAC breakfast   polyethylene glycol  17 g Oral TID   rosuvastatin  10 mg Oral Daily   Continuous Infusions:  lactated ringers 75 mL/hr at 12/22/21 1354     LOS: 1 day    Time spent: 81 min   Georgette Shell, MD 12/23/2021, 1:58 PM

## 2021-12-23 NOTE — Progress Notes (Signed)
Subjective: Patient seems to be doing better with the increased intensified bowel regimen and has had some BMs today.  On detailed questioning she tells me she takes Metamucil with very little water which might be worsening her constipation.  She is somewhat confused and cannot tell me whether it is Colace or MiraLAX that she takes on a regular basis.  Objective: Vital signs in last 24 hours: Temp:  [98 F (36.7 C)-98.5 F (36.9 C)] 98 F (36.7 C) (02/22 1637) Pulse Rate:  [76-89] 81 (02/22 1637) Resp:  [10-20] 16 (02/22 1151) BP: (148-177)/(75-84) 148/78 (02/22 1637) SpO2:  [95 %-100 %] 95 % (02/22 1637) Weight:  [93.4 kg] 93.4 kg (02/22 0828) Last BM Date : 12/23/21  Intake/Output from previous day: 02/21 0701 - 02/22 0700 In: 1000 [IV Piggyback:1000] Out: 1 [Stool:1] Intake/Output this shift: Total I/O In: 269.7 [I.V.:269.7] Out: 1 [Stool:1]  General appearance: alert, cooperative, appears stated age, no distress, and morbidly obese Resp: clear to auscultation bilaterally Cardio: regular rate and rhythm, S1, S2 normal, no murmur, click, rub or gallop GI: soft, distended with hyperactive bowel sounds in right lower quadrants on palpation with guarding but without rebound or rigidity; no masses,  no organomegaly  Lab Results: Recent Labs    12/22/21 0718 12/23/21 0330  WBC 11.5* 5.2  HGB 13.7 10.4*  HCT 43.9 33.8*  PLT 293 213   BMET Recent Labs    12/22/21 0718 12/23/21 0330  NA 139 139  K 4.1 3.0*  CL 103 107  CO2 24 21*  GLUCOSE 223* 125*  BUN 11 11  CREATININE 0.79 0.48  CALCIUM 9.6 8.2*   LFT Recent Labs    12/22/21 0718  PROT 7.9  ALBUMIN 4.0  AST 19  ALT 14  ALKPHOS 66  BILITOT 0.4   Studies/Results: DG Abd 1 View  Result Date: 12/23/2021 CLINICAL DATA:  Fall ileus. EXAM: ABDOMEN - 1 VIEW COMPARISON:  12/22/2021 FINDINGS: There is gaseous distension of large and small bowel within the abdomen, which appears similar to slightly improved  compared to the previous CT, when accounting for differences in technique. No gross free intraperitoneal air on AP portable supine view. Left iliac vascular stent. IMPRESSION: Persistent gaseous distension of large and small bowel within the abdomen, similar to slightly improved compared to previous CT. Electronically Signed   By: Davina Poke D.O.   On: 12/23/2021 15:15   CT ABDOMEN PELVIS W CONTRAST  Result Date: 12/22/2021 CLINICAL DATA:  Abdominal pain and nausea and vomiting for 2 days. Suspected bowel obstruction. EXAM: CT ABDOMEN AND PELVIS WITH CONTRAST TECHNIQUE: Multidetector CT imaging of the abdomen and pelvis was performed using the standard protocol following bolus administration of intravenous contrast. RADIATION DOSE REDUCTION: This exam was performed according to the departmental dose-optimization program which includes automated exposure control, adjustment of the mA and/or kV according to patient size and/or use of iterative reconstruction technique. CONTRAST:  173mL OMNIPAQUE IOHEXOL 300 MG/ML  SOLN COMPARISON:  11/06/2021 FINDINGS: Lower Chest: No acute findings. Hepatobiliary: No hepatic masses identified. Prior cholecystectomy. No evidence of biliary obstruction. Pancreas:  No mass or inflammatory changes. Spleen: Within normal limits in size and appearance. Adrenals/Urinary Tract: No masses identified. No evidence of ureteral calculi or hydronephrosis. Stomach/Bowel: Moderate stool again seen throughout the colon, however the colon is nondilated. Moderate diffuse small bowel dilatation is seen containing air-fluid levels, without significant change since previous study. Fecal content is seen within distal small bowel loops including the terminal ileum, also  without significant change. Normal appendix visualized. No focal inflammatory process identified. Vascular/Lymphatic: No pathologically enlarged lymph nodes. No acute vascular findings. Stent again seen within the left common iliac  vein. Reproductive: Prior hysterectomy noted. Adnexal regions are unremarkable in appearance. Other: Mild ascites in the right upper quadrant and pelvis shows mild decrease since previous study. Musculoskeletal:  No suspicious bone lesions identified. IMPRESSION: No significant change in moderate small bowel dilatation with fecal content in distal ileum, and moderate stool burden throughout nondilated colon. Although no etiology for mechanical small-bowel obstruction is seen, differential diagnosis includes functional small bowel obstruction due to stool and adynamic ileus. Mild ascites, slightly decreased since previous study. Electronically Signed   By: Marlaine Hind M.D.   On: 12/22/2021 10:12    Medications: I have reviewed the patient's current medications. Prior to Admission:  Medications Prior to Admission  Medication Sig Dispense Refill Last Dose   acetaminophen (TYLENOL) 500 MG tablet Take 1,000 mg by mouth every 6 (six) hours as needed for moderate pain or headache.   12/21/2021   apixaban (ELIQUIS) 5 MG TABS tablet Take 5 mg by mouth 2 (two) times daily.   12/21/2021 at 1500   aspirin EC 81 MG tablet Take 81 mg by mouth daily. Swallow whole.   12/20/2021   docusate sodium (COLACE) 100 MG capsule Take 1 capsule (100 mg total) by mouth 2 (two) times daily. 60 capsule 0 12/21/2021   DULoxetine (CYMBALTA) 20 MG capsule Take 20 mg by mouth every morning.   07/05/91   folic acid (FOLVITE) 1 MG tablet Take 1 tablet (1 mg total) by mouth daily. 30 tablet 0 12/21/2021   furosemide (LASIX) 20 MG tablet Take 20 mg by mouth daily.   12/21/2021   metFORMIN (GLUCOPHAGE-XR) 500 MG 24 hr tablet Take 1,000 mg by mouth daily with breakfast.   12/21/2021   methotrexate (RHEUMATREX) 2.5 MG tablet Take 8 tablets (20 mg total) by mouth once a week. 8 tablets weekly (Sunday) (Patient taking differently: Take 20 mg by mouth every Sunday. 8 tabs) 32 tablet 0 12/20/2021   oxybutynin (DITROPAN-XL) 5 MG 24 hr tablet Take 5 mg  by mouth daily.   12/21/2021   polyethylene glycol (MIRALAX / GLYCOLAX) 17 g packet Take 17 g by mouth 2 (two) times daily. (Patient taking differently: Take 17 g by mouth daily as needed for mild constipation.) 14 each 0 12/21/2021   rosuvastatin (CRESTOR) 10 MG tablet Take 1 tablet (10 mg total) by mouth daily. 30 tablet 0 12/21/2021   simethicone (MYLICON) 80 MG chewable tablet Chew 1 tablet (80 mg total) by mouth 4 (four) times daily. (Patient taking differently: Chew 80 mg by mouth every 6 (six) hours as needed for flatulence.) 30 tablet 0 unk   telmisartan (MICARDIS) 80 MG tablet Take 80 mg by mouth at bedtime.   12/20/2021   amLODipine (NORVASC) 5 MG tablet Take 5 mg by mouth daily.      Scheduled:  amLODipine  5 mg Oral Daily   apixaban  5 mg Oral BID   aspirin EC  81 mg Oral Daily   bethanechol  5 mg Oral TID   bisacodyl  10 mg Rectal Daily   dextrose       DULoxetine  20 mg Oral q morning   folic acid  1 mg Oral Daily   insulin aspart  0-15 Units Subcutaneous TID WC   irbesartan  300 mg Oral Daily   [START ON 12/24/2021] linaclotide  290  mcg Oral QAC breakfast   polyethylene glycol  17 g Oral TID   rosuvastatin  10 mg Oral Daily    Assessment/Plan: 1) Chronic constipation with adynamic ileus improving with present bowel regimen continue present care.  Should be advised against the use of fiber supplements if she is not in concomitantly increasing her fluid intake. 2) HTN/hyperlipidemia/prolonged QT interval. 3) AODM. 4) Rheumatoid arthritis. 5) History of DVT on Eliquis.   LOS: 1 day   Diane [name] 12/23/2021, 7:00 PM

## 2021-12-23 NOTE — Progress Notes (Signed)
°   12/23/21 1530  Clinical Encounter Type  Visited With Patient  Visit Type Initial;Spiritual support   Chaplain visited patient as part of rounding on the floor.  Patient shared that she had been in the hospital several times for the same issue.  She is hoping that this time a solution may have been identified. Family has been visiting with her making the stay easier, but she is looking forward to going home. She misses her familiar surroundings.  We spoke a little while and I let her know if she needed a chaplain to inform her nurse.   [place] Resident  [facility] [phone]

## 2021-12-23 NOTE — Progress Notes (Signed)
[place] for heparin Indication: DVT  Allergies  Allergen Reactions   Cephalexin Other (See Comments)    URINARY RETENTION = "blocked my urine"   Tapentadol     Other reaction(s): zombie Other reaction(s): zombie   Codeine Hives and Rash   Nucynta [Tapentadol Hcl] Other (See Comments)    Altered mental status    Patient Measurements: Height: 5\' 8"  (172.7 cm) Weight: 93.4 kg (206 lb) IBW/kg (Calculated) : 63.9 Heparin Dosing Weight:   Vital Signs: Temp: 98.2 F (36.8 C) (02/22 0828) Temp Source: Oral (02/22 0828) BP: 153/82 (02/22 0828) Pulse Rate: 76 (02/22 0828)  Labs: Recent Labs    12/22/21 0718 12/22/21 2201 12/23/21 0330  HGB 13.7  --  10.4*  HCT 43.9  --  33.8*  PLT 293  --  213  APTT  --  79* 98*  HEPARINUNFRC  --  >1.10* >1.10*  CREATININE 0.79  --  0.48     Estimated Creatinine Clearance: 72.6 mL/min (by C-G formula based on SCr of 0.48 mg/dL).   Assessment: 59 yof presented to the ED with abdominal pain. To start IV heparin for history of VTE. She is on chronic apixaban with her last dose on 12/21/21 @ 15:00. Surgery consulted and determined there is no need for surgical intervention.  H/H trending down. Plt stable. No bleeding noted. aPTT 98 seconds Heparin level >1.1 - Apixaban interferes with heparin levels, monitor aPTT until apixaban is cleared.  Goal of Therapy:  Heparin level 0.3-0.7 units/ml aPTT 66-103 seconds Monitor platelets by anticoagulation protocol: Yes   Plan:  Transition from heparin infusion to home apixaban 5mg  BID.  Monitor s/sx of bleeding.  Kaleen Mask 12/23/2021,8:33 AM

## 2021-12-24 ENCOUNTER — Inpatient Hospital Stay (HOSPITAL_COMMUNITY): Payer: Medicare Other

## 2021-12-24 DIAGNOSIS — M79604 Pain in right leg: Secondary | ICD-10-CM

## 2021-12-24 DIAGNOSIS — M7989 Other specified soft tissue disorders: Secondary | ICD-10-CM

## 2021-12-24 LAB — CBC
HCT: 32 % — ABNORMAL LOW (ref 36.0–46.0)
Hemoglobin: 9.8 g/dL — ABNORMAL LOW (ref 12.0–15.0)
MCH: 26 pg (ref 26.0–34.0)
MCHC: 30.6 g/dL (ref 30.0–36.0)
MCV: 84.9 fL (ref 80.0–100.0)
Platelets: 189 10*3/uL (ref 150–400)
RBC: 3.77 MIL/uL — ABNORMAL LOW (ref 3.87–5.11)
RDW: 17.5 % — ABNORMAL HIGH (ref 11.5–15.5)
WBC: 5.5 10*3/uL (ref 4.0–10.5)
nRBC: 0 % (ref 0.0–0.2)

## 2021-12-24 LAB — COMPREHENSIVE METABOLIC PANEL
ALT: 8 U/L (ref 0–44)
AST: 13 U/L — ABNORMAL LOW (ref 15–41)
Albumin: 2.9 g/dL — ABNORMAL LOW (ref 3.5–5.0)
Alkaline Phosphatase: 48 U/L (ref 38–126)
Anion gap: 9 (ref 5–15)
BUN: 6 mg/dL — ABNORMAL LOW (ref 8–23)
CO2: 23 mmol/L (ref 22–32)
Calcium: 8.3 mg/dL — ABNORMAL LOW (ref 8.9–10.3)
Chloride: 107 mmol/L (ref 98–111)
Creatinine, Ser: 0.53 mg/dL (ref 0.44–1.00)
GFR, Estimated: >60 mL/min (ref >60)
Glucose, Bld: 108 mg/dL — ABNORMAL HIGH (ref 70–99)
Potassium: 3.7 mmol/L (ref 3.5–5.1)
Sodium: 139 mmol/L (ref 135–145)
Total Bilirubin: 0.5 mg/dL (ref 0.3–1.2)
Total Protein: 5.8 g/dL — ABNORMAL LOW (ref 6.5–8.1)

## 2021-12-24 LAB — GLUCOSE, CAPILLARY
Glucose-Capillary: 110 mg/dL — ABNORMAL HIGH (ref 70–99)
Glucose-Capillary: 110 mg/dL — ABNORMAL HIGH (ref 70–99)
Glucose-Capillary: 115 mg/dL — ABNORMAL HIGH (ref 70–99)
Glucose-Capillary: 114 mg/dL — ABNORMAL HIGH (ref 70–99)
Glucose-Capillary: 131 mg/dL — ABNORMAL HIGH (ref 70–99)

## 2021-12-24 LAB — HEPARIN LEVEL (UNFRACTIONATED): Heparin Unfractionated: >1.1 IU/mL — ABNORMAL HIGH (ref 0.30–0.70)

## 2021-12-24 LAB — MAGNESIUM: Magnesium: 1.7 mg/dL (ref 1.7–2.4)

## 2021-12-24 LAB — APTT: aPTT: 32 seconds (ref 24–36)

## 2021-12-24 MED ORDER — POTASSIUM CHLORIDE CRYS ER 20 MEQ PO TBCR
40.0000 meq | EXTENDED_RELEASE_TABLET | Freq: Once | ORAL | Status: AC
  Administered 2021-12-24: 40 meq via ORAL
  Filled 2021-12-24: qty 2

## 2021-12-24 MED ORDER — MAGNESIUM SULFATE 4 GM/100ML IV SOLN
4.0000 g | Freq: Once | INTRAVENOUS | Status: AC
  Administered 2021-12-24: 4 g via INTRAVENOUS
  Filled 2021-12-24: qty 100

## 2021-12-24 MED ORDER — SORBITOL 70 % SOLN
300.0000 mL | TOPICAL_OIL | Freq: Once | ORAL | Status: AC
  Administered 2021-12-24: 300 mL via RECTAL
  Filled 2021-12-24: qty 90

## 2021-12-24 NOTE — Progress Notes (Signed)
Lower extremity venous RT study completed.   Please see CV Proc for preliminary results.   Dondra Rhett, RDMS, RVT  

## 2021-12-24 NOTE — Progress Notes (Signed)
°  Transition of Care [facility]) Screening Note   Patient Details  Name: Diane [name] Date of Birth: Mar 24, 1946   Transition of Care [facility]) CM/SW Contact:    Tom-Johnson, Renea Ee, RN Phone Number: 12/24/2021, 3:49 PM  Patient is from home alone. Admitted for Adynamic Ileus. Has three adult children who lives close by. Has a cane, walker, shower seat at home. Independent with care and drives self prior to admission. PCP is [name], MD and uses [facility] on [facility]. Transition of Care Department [facility]) has reviewed patient and no TOC needs or recommendations have been identified at this time. TOC will continue to monitor patient advancement through interdisciplinary progression rounds. If new patient transition needs arise, please place a TOC consult.

## 2021-12-24 NOTE — Progress Notes (Addendum)
PROGRESS NOTE    SADIE HAZELETT  ZOX:[id number] DOB: May 22, 1946 DOA: 12/22/2021 PCP: [name], MD   Brief Narrative: HPI per Dr. Lorin Mercy on 12/22/2021  Jenna Luo Colbaugh is a 76 y.o. female with medical history significant of DM; HTN; CAD; RA; DVT s/p mechanical thrombectomy on 06/01/21 on chronic Eliquis; and chronic diastolic CHF presenting with abdominal pain with n/v.  She was previously admitted from 1/6-10 with similar issues.  GI was consulted and she received enemas and was placed on an aggressive bowel regimen with improvement.   She has had ongoing problems in the last month - no vomiting but would have cramps.  She started vomiting yesterday and suffered with cramps all night.  She has been taking stool softener and Miralax, has had soft stools.  She couldn't sleep night before last due to leg cramps.  She has a lot of abdominal distention.  She has changed her diet, eating less, sometimes fruit and vegetables.  She reports that this is her 10th hospitalization for this issue since March 2022 - Epic shows admission 3/24-4/1 with GI and surgery consulting; DVT admission 4/6-12 with thrombectomy and thrombolysis; admission from 7/22-27 for SBO; recurrent DVT with thrombectomy 7/30-8/5; and ER visit for constipation on 11/13.  Her next visit was her most recent hospitalization. Assessment & Plan:   Principal Problem:   Adynamic ileus ([place]) Active Problems:   Hypertension   Prolonged Q-T interval on ECG   Type 2 diabetes mellitus with diabetic neuropathy, unspecified (HCC)   Rheumatoid arthritis ([place])   History of DVT (deep vein thrombosis)   Hyperlipidemia, unspecified   #1 adynamic ileus with recurrent hospital admissions for the same. Patient admitted with abdominal pain nausea vomiting abdominal distention and CT findings consistent with adynamic ileus versus SBO.  Patient has not had any nausea vomiting today.  She had a bowel movement yesterday-after she came up to 52 M and is  having flatus this morning.  She denies any nausea vomiting.  I will advance her diet to clear liquids.  KUB from today still shows some distention in the small bowel and colon.  Morphine stopped to minimize narcotics.  Continue MiraLAX 3 times a day with 1 full glass of water or liquid.  Continue Linzess. Continue slow IV hydration. Colonoscopy January 2023 showed only 3 polyps the cecum.  No evidence of stricture was noted. Encourage out of bed increase activity ambulate with assistance.  #2 history of DVT patient had thrombectomy in April and July of last year. Had complaint of right lower extremity heaviness and pain overnight.  A repeat Doppler of the right lower extremity has been ordered.  Continue Eliquis.  #3 history of rheumatoid arthritis on methotrexate  #4 type 2 diabetes complicated with neuropathy on metformin at home Continue to hold metformin Patient was hypoglycemic started on D5 normal saline at 50 cc an hour. CBG (last 3)  Recent Labs    12/23/21 2350 12/24/21 0411 12/24/21 0749  GLUCAP 124* 110* 110*     #5 history of essential hypertension on Norvasc and Micardis continue as needed hydralazine.  Lasix on hold.  #6 prolonged QT-improving QTc down to 476 from 574.  Continue to avoid QT prolonging agents as much as possible.  #7 hypokalemia and hypomagnesemia replete both to keep levels above 4 and 2 respectively.    Estimated body mass index is 30.94 kg/m as calculated from the following:   Height as of this encounter: 5\' 8"  (1.727 m).   Weight  as of this encounter: 92.3 kg.  DVT prophylaxis: Eliquis  code Status: Full code Family Communication: None at bedside  disposition Plan:  Status is: Inpatient Remains inpatient appropriate because: Ileus on IV fluids patient n.p.o   Consultants:  GI  Procedures: None Antimicrobials: None  Subjective:  Patient resting in bed she reports having flatus today.  She had a BM yesterday since coming up to 5 AM.   She received smog enema yesterday.  Denies any nausea vomiting.  Continues to have right-sided abdominal discomfort.  Objective: Vitals:   12/24/21 0414 12/24/21 0415 12/24/21 0545 12/24/21 0906  BP:  (!) 159/86 (!) 162/82 (!) 151/72  Pulse:  81 81 76  Resp:  17 20 19   Temp:  98.3 F (36.8 C) 98.4 F (36.9 C) 99.2 F (37.3 C)  TempSrc:  Oral Oral Oral  SpO2:  94% 96% 95%  Weight: 92.3 kg     Height:        Intake/Output Summary (Last 24 hours) at 12/24/2021 1113 Last data filed at 12/24/2021 0600 Gross per 24 hour  Intake 1020 ml  Output 1 ml  Net 1019 ml    Filed Weights   12/23/21 0828 12/24/21 0414  Weight: 93.4 kg 92.3 kg    Examination:  General exam: Appears in mild distress  respiratory system: Clear to auscultation. Respiratory effort normal. Cardiovascular system: S1 & S2 heard, RRR. No JVD, murmurs, rubs, gallops or clicks. No pedal edema. Gastrointestinal system: Abdomen is distended, soft and upper right-sided tender. No organomegaly or masses felt. Normal bowel sounds heard. Central nervous system: Alert and oriented. No focal neurological deficits. Extremities: Symmetric 5 x 5 power. Skin: No rashes, lesions or ulcers Psychiatry: Judgement and insight appear normal. Mood & affect appropriate.     Data Reviewed: I have personally reviewed following labs and imaging studies  CBC: Recent Labs  Lab 12/22/21 0718 12/23/21 0330 12/24/21 0434  WBC 11.5* 5.2 5.5  HGB 13.7 10.4* 9.8*  HCT 43.9 33.8* 32.0*  MCV 84.3 86.0 84.9  PLT 293 213 355    Basic Metabolic Panel: Recent Labs  Lab 12/22/21 0718 12/23/21 0330 12/24/21 0434  NA 139 139 139  K 4.1 3.0* 3.7  CL 103 107 107  CO2 24 21* 23  GLUCOSE 223* 125* 108*  BUN 11 11 6*  CREATININE 0.79 0.48 0.53  CALCIUM 9.6 8.2* 8.3*  MG  --  1.6* 1.7    GFR: Estimated Creatinine Clearance: 72.2 mL/min (by C-G formula based on SCr of 0.53 mg/dL). Liver Function Tests: Recent Labs  Lab  12/22/21 0718 12/24/21 0434  AST 19 13*  ALT 14 8  ALKPHOS 66 48  BILITOT 0.4 0.5  PROT 7.9 5.8*  ALBUMIN 4.0 2.9*    Recent Labs  Lab 12/22/21 0718  LIPASE 35    No results for input(s): AMMONIA in the last 168 hours. Coagulation Profile: No results for input(s): INR, PROTIME in the last 168 hours. Cardiac Enzymes: No results for input(s): CKTOTAL, CKMB, CKMBINDEX, TROPONINI in the last 168 hours. BNP (last 3 results) No results for input(s): PROBNP in the last 8760 hours. HbA1C: No results for input(s): HGBA1C in the last 72 hours. CBG: Recent Labs  Lab 12/23/21 1635 12/23/21 2019 12/23/21 2350 12/24/21 0411 12/24/21 0749  GLUCAP 89 95 124* 110* 110*    Lipid Profile: No results for input(s): CHOL, HDL, LDLCALC, TRIG, CHOLHDL, LDLDIRECT in the last 72 hours. Thyroid Function Tests: No results for input(s): TSH, T4TOTAL,  FREET4, T3FREE, THYROIDAB in the last 72 hours. Anemia Panel: No results for input(s): VITAMINB12, FOLATE, FERRITIN, TIBC, IRON, RETICCTPCT in the last 72 hours. Sepsis Labs: No results for input(s): PROCALCITON, LATICACIDVEN in the last 168 hours.  Recent Results (from the past 240 hour(s))  Resp Panel by RT-PCR (Flu A&B, Covid) Nasopharyngeal Swab     Status: None   Collection Time: 12/22/21 10:20 PM   Specimen: Nasopharyngeal Swab; Nasopharyngeal(NP) swabs in vial transport medium  Result Value Ref Range Status   SARS Coronavirus 2 by RT PCR NEGATIVE NEGATIVE Final    Comment: (NOTE) SARS-CoV-2 target nucleic acids are NOT DETECTED.  The SARS-CoV-2 RNA is generally detectable in upper respiratory specimens during the acute phase of infection. The lowest concentration of SARS-CoV-2 viral copies this assay can detect is 138 copies/mL. A negative result does not preclude SARS-Cov-2 infection and should not be used as the sole basis for treatment or other patient management decisions. A negative result may occur with  improper specimen  collection/handling, submission of specimen other than nasopharyngeal swab, presence of viral mutation(s) within the areas targeted by this assay, and inadequate number of viral copies(<138 copies/mL). A negative result must be combined with clinical observations, patient history, and epidemiological information. The expected result is Negative.  Fact Sheet for Patients:  EntrepreneurPulse.com.au  Fact Sheet for Healthcare Providers:  IncredibleEmployment.be  This test is no t yet approved or cleared by the [place] FDA and  has been authorized for detection and/or diagnosis of SARS-CoV-2 by FDA under an Emergency Use Authorization (EUA). This EUA will remain  in effect (meaning this test can be used) for the duration of the COVID-19 declaration under Section 564(b)(1) of the Act, 21 U.S.C.section 360bbb-3(b)(1), unless the authorization is terminated  or revoked sooner.       Influenza A by PCR NEGATIVE NEGATIVE Final   Influenza B by PCR NEGATIVE NEGATIVE Final    Comment: (NOTE) The Xpert Xpress SARS-CoV-2/FLU/RSV plus assay is intended as an aid in the diagnosis of influenza from Nasopharyngeal swab specimens and should not be used as a sole basis for treatment. Nasal washings and aspirates are unacceptable for Xpert Xpress SARS-CoV-2/FLU/RSV testing.  Fact Sheet for Patients: EntrepreneurPulse.com.au  Fact Sheet for Healthcare Providers: IncredibleEmployment.be  This test is not yet approved or cleared by the [place] FDA and has been authorized for detection and/or diagnosis of SARS-CoV-2 by FDA under an Emergency Use Authorization (EUA). This EUA will remain in effect (meaning this test can be used) for the duration of the COVID-19 declaration under Section 564(b)(1) of the Act, 21 U.S.C. section 360bbb-3(b)(1), unless the authorization is terminated or revoked.  Performed at [facility], [place] [address], [place], [place] [address]           Radiology Studies: DG Abd 1 View  Result Date: 12/23/2021 CLINICAL DATA:  Fall ileus. EXAM: ABDOMEN - 1 VIEW COMPARISON:  12/22/2021 FINDINGS: There is gaseous distension of large and small bowel within the abdomen, which appears similar to slightly improved compared to the previous CT, when accounting for differences in technique. No gross free intraperitoneal air on AP portable supine view. Left iliac vascular stent. IMPRESSION: Persistent gaseous distension of large and small bowel within the abdomen, similar to slightly improved compared to previous CT. Electronically Signed   By: Davina Poke D.O.   On: 12/23/2021 15:15   DG Abd Portable 1V  Result Date: 12/24/2021 CLINICAL DATA:  Ileus EXAM: PORTABLE ABDOMEN -  1 VIEW COMPARISON:  Portable exam 0519 hours compared to 12/23/2021 FINDINGS: Vascular stent LEFT iliac. Slight gaseous distention of bowel loops in the mid abdomen again seen. No bowel wall thickening or evidence of obstruction. Surgical clips RIGHT upper quadrant. No acute osseous findings. IMPRESSION: Persistent gaseous distention of large and small bowel loops in mid abdomen. Electronically Signed   By: Lavonia Dana M.D.   On: 12/24/2021 08:20        Scheduled Meds:  amLODipine  5 mg Oral Daily   apixaban  5 mg Oral BID   aspirin EC  81 mg Oral Daily   bethanechol  5 mg Oral TID   bisacodyl  10 mg Rectal Daily   DULoxetine  20 mg Oral q morning   folic acid  1 mg Oral Daily   insulin aspart  0-15 Units Subcutaneous TID WC   irbesartan  300 mg Oral Daily   linaclotide  290 mcg Oral QAC breakfast   polyethylene glycol  17 g Oral TID   rosuvastatin  10 mg Oral Daily   Continuous Infusions:  dextrose 5 % and 0.9% NaCl 75 mL/hr at 12/24/21 0502     LOS: 2 days    Time spent: 55 min   Georgette Shell, MD 12/24/2021, 11:13 AM

## 2021-12-24 NOTE — Progress Notes (Signed)
HOSPITAL MEDICINE OVERNIGHT EVENT NOTE    Notified by nursing that patient is complaining of right lower extremity heaviness, similar to when she experienced a DVT in the past.  Patient describes the discomfort as a 7 out of 10 of intensity, worse with weightbearing.  Chart reviewed, patient currently n.p.o. due to ileus.  Patient is already on ongoing Eliquis therapy making a new DVT extremely unlikely.  Due to severity of patient's symptoms we will go ahead and obtain an ultrasound of the right lower extremity.  Vernelle Emerald  MD [facility]

## 2021-12-24 NOTE — Significant Event (Signed)
Rapid Response Event Note   Reason for Call :  7/10 R mid calf pain radiating up to R mid thigh. Pt is worried about this being another DVT.  Initial Focused Assessment:  Pt lying in bed with eyes open, in no distress. She is alert and oriented. She denies chest pain/SOB. Lungs clear t/o. Skin warm and dry. B legs/feet are warm to touch with palpable pulses.   T-98.4, HR-81, BP-162/82, RR-20, SpO2-96% on RA.  Interventions:  Bedrest LE doppler in AM Plan of Care:  Pt is stable. Await doppler study results. Continue to monitor closely. Call RRT if further assistance needed.   Event Summary:   MD Notified: Dr. Cyd Silence notified by bedside RN Call [place] Time:0610  Dillard Essex, RN

## 2021-12-24 NOTE — Progress Notes (Signed)
While assisting patient to Diane [name] at approximately 0530, patient began complaining of discomfort when applying weight to RLE.  She c/o of heaviness in RLE (similar to DVT in LLE last year) and rates at 7/10.  It starts laterally mid calf and progressed to mid thigh suddenly.  VS are stable.  BS clear and no shortness of breath.  BLE are warm to touch and pulses are easily palpable.  Rapid Response Nurse and Dr. Cyd Silence made aware.  Orders received and implemented.  Will continue to monitor patient.  Earleen Reaper RN

## 2021-12-24 NOTE — Plan of Care (Signed)
Problem: Clinical Measurements: °Goal: Will remain free from infection °Outcome: Completed/Met °  °

## 2021-12-24 NOTE — Progress Notes (Signed)
Subjective: Three bowel movements yesterday.  She is feeling better, but she was not able to rest last evening as a result of right foot pain.  Objective: Vital signs in last 24 hours: Temp:  [98 F (36.7 C)-99.2 F (37.3 C)] 99.2 F (37.3 C) (02/23 0906) Pulse Rate:  [76-85] 76 (02/23 0906) Resp:  [16-20] 19 (02/23 0906) BP: (148-164)/(70-86) 151/72 (02/23 0906) SpO2:  [94 %-100 %] 95 % (02/23 0906) Weight:  [92.3 kg] 92.3 kg (02/23 0414) Last BM Date : 12/23/21  Intake/Output from previous day: 02/22 0701 - 02/23 0700 In: 1289.7 [P.O.:120; I.V.:1169.7] Out: 1 [Stool:1] Intake/Output this shift: Total I/O In: 220 [P.O.:220] Out: -   General appearance: alert and no distress GI: soft, non-tender; bowel sounds normal; no masses,  no organomegaly  Lab Results: Recent Labs    12/22/21 0718 12/23/21 0330 12/24/21 0434  WBC 11.5* 5.2 5.5  HGB 13.7 10.4* 9.8*  HCT 43.9 33.8* 32.0*  PLT 293 213 189   BMET Recent Labs    12/22/21 0718 12/23/21 0330 12/24/21 0434  NA 139 139 139  K 4.1 3.0* 3.7  CL 103 107 107  CO2 24 21* 23  GLUCOSE 223* 125* 108*  BUN 11 11 6*  CREATININE 0.79 0.48 0.53  CALCIUM 9.6 8.2* 8.3*   LFT Recent Labs    12/24/21 0434  PROT 5.8*  ALBUMIN 2.9*  AST 13*  ALT 8  ALKPHOS 48  BILITOT 0.5   PT/INR No results for input(s): LABPROT, INR in the last 72 hours. Hepatitis Panel No results for input(s): HEPBSAG, HCVAB, HEPAIGM, HEPBIGM in the last 72 hours. C-Diff No results for input(s): CDIFFTOX in the last 72 hours. Fecal Lactopherrin No results for input(s): FECLLACTOFRN in the last 72 hours.  Studies/Results: DG Abd 1 View  Result Date: 12/23/2021 CLINICAL DATA:  Fall ileus. EXAM: ABDOMEN - 1 VIEW COMPARISON:  12/22/2021 FINDINGS: There is gaseous distension of large and small bowel within the abdomen, which appears similar to slightly improved compared to the previous CT, when accounting for differences in technique. No gross  free intraperitoneal air on AP portable supine view. Left iliac vascular stent. IMPRESSION: Persistent gaseous distension of large and small bowel within the abdomen, similar to slightly improved compared to previous CT. Electronically Signed   By: Davina Poke D.O.   On: 12/23/2021 15:15   DG Abd Portable 1V  Result Date: 12/24/2021 CLINICAL DATA:  Ileus EXAM: PORTABLE ABDOMEN - 1 VIEW COMPARISON:  Portable exam 0519 hours compared to 12/23/2021 FINDINGS: Vascular stent LEFT iliac. Slight gaseous distention of bowel loops in the mid abdomen again seen. No bowel wall thickening or evidence of obstruction. Surgical clips RIGHT upper quadrant. No acute osseous findings. IMPRESSION: Persistent gaseous distention of large and small bowel loops in mid abdomen. Electronically Signed   By: Lavonia Dana M.D.   On: 12/24/2021 08:20   VAS [place] LOWER EXTREMITY VENOUS (DVT)  Result Date: 12/24/2021  Lower Venous DVT Study Patient Name:  Diane [name]  Date of Exam:   12/24/2021 Medical Rec #: [id number]       Accession #:    [id number] Date of Birth: 04/09/1946       Patient Gender: F Patient Age:   76 years Exam Location:  [facility] Procedure:      VAS [place] LOWER EXTREMITY VENOUS (DVT) Referring Phys: Inda Merlin --------------------------------------------------------------------------------  Indications: Pain and swelling RT lower extremity, history of extensive LT lower extremity DVT.  Risk  Factors: Surgery 06-21-2021 Left lower extremity thrombectomy. Anticoagulation: Eliquis. Comparison Study: 06-27-2020 Prior RIGHT lower extremity venous was negative for                   DVT. Multiple prior LEFT lower extremity venous studies with                   history of DVT. Performing Technologist: Darlin Coco RDMS, RVT  Examination Guidelines: A complete evaluation includes B-mode imaging, spectral Doppler, color Doppler, and power Doppler as needed of all accessible portions of each vessel. Bilateral testing  is considered an integral part of a complete examination. Limited examinations for reoccurring indications may be performed as noted. The reflux portion of the exam is performed with the patient in reverse Trendelenburg.  +---------+---------------+---------+-----------+----------+--------------+  RIGHT     Compressibility Phasicity Spontaneity Properties Thrombus Aging  +---------+---------------+---------+-----------+----------+--------------+  CFV       Full            Yes       Yes                                    +---------+---------------+---------+-----------+----------+--------------+  SFJ       Full                                                             +---------+---------------+---------+-----------+----------+--------------+  FV Prox   Full                                                             +---------+---------------+---------+-----------+----------+--------------+  FV Mid    Full                                                             +---------+---------------+---------+-----------+----------+--------------+  FV Distal Full                                                             +---------+---------------+---------+-----------+----------+--------------+  PFV       Full                                                             +---------+---------------+---------+-----------+----------+--------------+  POP       Full            Yes       Yes                                    +---------+---------------+---------+-----------+----------+--------------+  PTV       Full                                                             +---------+---------------+---------+-----------+----------+--------------+  PERO      Full                                                             +---------+---------------+---------+-----------+----------+--------------+  Gastroc   Full                                                              +---------+---------------+---------+-----------+----------+--------------+   +----+---------------+---------+-----------+----------+--------------+  LEFT Compressibility Phasicity Spontaneity Properties Thrombus Aging  +----+---------------+---------+-----------+----------+--------------+  CFV  Full            Yes       Yes                                    +----+---------------+---------+-----------+----------+--------------+     Summary: RIGHT: - There is no evidence of deep vein thrombosis in the lower extremity.  - No cystic structure found in the popliteal fossa.  LEFT: - No evidence of common femoral vein obstruction.  *See table(s) above for measurements and observations.    Preliminary     Medications: Scheduled:  amLODipine  5 mg Oral Daily   apixaban  5 mg Oral BID   aspirin EC  81 mg Oral Daily   bethanechol  5 mg Oral TID   bisacodyl  10 mg Rectal Daily   DULoxetine  20 mg Oral q morning   folic acid  1 mg Oral Daily   insulin aspart  0-15 Units Subcutaneous TID WC   irbesartan  300 mg Oral Daily   linaclotide  290 mcg Oral QAC breakfast   polyethylene glycol  17 g Oral TID   rosuvastatin  10 mg Oral Daily   sorbitol, milk of mag, mineral oil, glycerin (SMOG) enema  300 mL Rectal Once   Continuous:  dextrose 5 % and 0.9% NaCl 50 mL/hr at 12/24/21 1213    Assessment/Plan: 1) Ileus - resolved. 2) Constipation.   Clinically she appears improved.  She appears to be responding to the current bowel regimen.  She will maintain this regimen.  Plan: 1) Continue the current regimen. 2) Follow up with Dr. Collene Mares in 1-2 weeks upon discharge.  LOS: 2 days   Cylas Falzone D 12/24/2021, 3:58 PM

## 2021-12-25 ENCOUNTER — Other Ambulatory Visit: Payer: Self-pay | Admitting: *Deleted

## 2021-12-25 ENCOUNTER — Inpatient Hospital Stay (HOSPITAL_COMMUNITY): Payer: Medicare Other

## 2021-12-25 LAB — MAGNESIUM: Magnesium: 2.1 mg/dL (ref 1.7–2.4)

## 2021-12-25 LAB — CBC
HCT: 33.9 % — ABNORMAL LOW (ref 36.0–46.0)
Hemoglobin: 10.3 g/dL — ABNORMAL LOW (ref 12.0–15.0)
MCH: 25.9 pg — ABNORMAL LOW (ref 26.0–34.0)
MCHC: 30.4 g/dL (ref 30.0–36.0)
MCV: 85.4 fL (ref 80.0–100.0)
Platelets: 200 10*3/uL (ref 150–400)
RBC: 3.97 MIL/uL (ref 3.87–5.11)
RDW: 17.6 % — ABNORMAL HIGH (ref 11.5–15.5)
WBC: 5.6 10*3/uL (ref 4.0–10.5)
nRBC: 0 % (ref 0.0–0.2)

## 2021-12-25 LAB — COMPREHENSIVE METABOLIC PANEL
ALT: 12 U/L (ref 0–44)
AST: 16 U/L (ref 15–41)
Albumin: 3 g/dL — ABNORMAL LOW (ref 3.5–5.0)
Alkaline Phosphatase: 48 U/L (ref 38–126)
Anion gap: 7 (ref 5–15)
BUN: <5 mg/dL — ABNORMAL LOW (ref 8–23)
CO2: 22 mmol/L (ref 22–32)
Calcium: 8.3 mg/dL — ABNORMAL LOW (ref 8.9–10.3)
Chloride: 108 mmol/L (ref 98–111)
Creatinine, Ser: 0.61 mg/dL (ref 0.44–1.00)
GFR, Estimated: >60 mL/min (ref >60)
Glucose, Bld: 113 mg/dL — ABNORMAL HIGH (ref 70–99)
Potassium: 3.8 mmol/L (ref 3.5–5.1)
Sodium: 137 mmol/L (ref 135–145)
Total Bilirubin: 0.4 mg/dL (ref 0.3–1.2)
Total Protein: 6 g/dL — ABNORMAL LOW (ref 6.5–8.1)

## 2021-12-25 LAB — GLUCOSE, CAPILLARY
Glucose-Capillary: 116 mg/dL — ABNORMAL HIGH (ref 70–99)
Glucose-Capillary: 103 mg/dL — ABNORMAL HIGH (ref 70–99)

## 2021-12-25 MED ORDER — DOCUSATE SODIUM 100 MG PO CAPS: 200.0000 mg | ORAL_CAPSULE | Freq: Three times a day (TID) | ORAL | 0 refills | Status: DC

## 2021-12-25 MED ORDER — LINACLOTIDE 290 MCG PO CAPS: 290.0000 ug | ORAL_CAPSULE | Freq: Every day | ORAL | 2 refills | Status: DC

## 2021-12-25 MED ORDER — GLYCERIN (ADULT) 2 G RE SUPP: 1.0000 | RECTAL | 0 refills | Status: DC | PRN

## 2021-12-25 MED ORDER — POLYETHYLENE GLYCOL 3350 17 G PO PACK: 17.0000 g | PACK | Freq: Three times a day (TID) | ORAL | 0 refills | Status: DC

## 2021-12-25 MED ORDER — BETHANECHOL CHLORIDE 5 MG PO TABS: 5.0000 mg | ORAL_TABLET | Freq: Three times a day (TID) | ORAL | 2 refills | Status: DC

## 2021-12-25 NOTE — Patient Outreach (Signed)
[facility]) Care Management  12/25/2021  Diane [name] [birth year] [id number]   Telephone Assessment-Unsuccessful  RN attempted outreach call however unsuccessful. RN able to leave a HIPAA voice message requesting a call back.  Will attempted another outreach call over the next week for pending services.  [name], RN Care Management Coordinator [place] [facility] [phone]

## 2021-12-25 NOTE — TOC Transition Note (Signed)
Transition of Care [facility]) - CM/SW Discharge Note   Patient Details  Name: Diane [name] MRN: [id number] Date of Birth: 1946/03/11  [facility]) CM/SW Contact:  Tom-Johnson, Renea Ee, RN Phone Number: 12/25/2021, 11:37 AM   Clinical Narrative:     Patient is scheduled for discharge today. Denies any needs. No recommendations noted. Family to transport at discharge. No further TOC needs noted.  Final next level of care: Home/Self Care Barriers to Discharge: Barriers Resolved   Patient Goals and CMS Choice Patient states their goals for this hospitalization and ongoing recovery are:: To return home CMS Medicare.gov Compare Post Acute Care list provided to:: Patient Choice offered to / list presented to : NA  Discharge Placement                       Discharge Plan and Services                DME Arranged: N/A DME Agency: NA       HH Arranged: NA HH Agency: NA        Social Determinants of Health (SDOH) Interventions     Readmission Risk Interventions Readmission Risk Prevention Plan 07/15/2021  Transportation Screening Complete  Medication Review Press photographer) Complete  PCP or Specialist appointment within 3-5 days of discharge Complete  HRI or [place] Complete  SW Recovery Care/Counseling Consult Complete  [place] Not Applicable  Some recent data might be hidden

## 2021-12-25 NOTE — Discharge Summary (Signed)
Physician Discharge Summary  Diane [name] EGB:[id number] DOB: 1946/10/05 DOA: 12/22/2021  PCP: [name], MD  Admit date: 12/22/2021 Discharge date: 12/25/2021  Admitted From: Home Disposition: Home  Recommendations for Outpatient Follow-up:  Follow up with PCP in 1-2 weeks Please obtain BMP/CBC in one week Please follow up with GI Dr. Collene Mares Home Health: PT Equipment/Devices: None Discharge Condition: Stable CODE STATUS: Full code Diet recommendation: Cardiac carb modified Brief/Interim Summary:Diane [name] is a 76 y.o. female with medical history significant of DM; HTN; CAD; RA; DVT s/p mechanical thrombectomy on 06/01/21 on chronic Eliquis; and chronic diastolic CHF presenting with abdominal pain with n/v.  She was previously admitted from 1/6-10 with similar issues.  GI was consulted and she received enemas and was placed on an aggressive bowel regimen with improvement.   She has had ongoing problems in the last month - no vomiting but would have cramps.  She started vomiting yesterday and suffered with cramps all night.  She has been taking stool softener and Miralax, has had soft stools.  She couldn't sleep night before last due to leg cramps.  She has a lot of abdominal distention.  She has changed her diet, eating less, sometimes fruit and vegetables.  She reports that this is her 10th hospitalization for this issue since March 2022 - Epic shows admission 3/24-4/1 with GI and surgery consulting; DVT admission 4/6-12 with thrombectomy and thrombolysis; admission from 7/22-27 for SBO; recurrent DVT with thrombectomy 7/30-8/5; and ER visit for constipation on 11/13.  Her next visit was her most recent hospitalization.   Discharge Diagnoses:  Principal Problem:   Adynamic ileus ([place]) Active Problems:   Hypertension   Prolonged Q-T interval on ECG   Type 2 diabetes mellitus with diabetic neuropathy, unspecified (HCC)   Rheumatoid arthritis ([place])   History of DVT (deep vein  thrombosis)   Hyperlipidemia, unspecified    #1 adynamic ileus with recurrent hospital admissions for the same. Patient admitted with abdominal pain nausea vomiting abdominal distention and CT findings consistent with adynamic ileus versus SBO.  Patient tolerated a mechanical soft diet prior to discharge.  She had bowel movements and flatus prior to discharge.  She was asked to continue MiraLAX with a full glass of water 3 times a day, Colace 2 tablets 3 times a day, Linzess and glycerin suppositories as needed.  Discussed with her to eat frequent small meals then eating 3 large meals.  Advised her to encourage activities and to be ambulating as much as safely possible.     #2 history of DVT patient had thrombectomy in April and July of last year. Had complaint of right lower extremity heaviness and pain overnight.  A repeat Doppler of the right lower extremity shows no DVT.Marland Kitchen  Continue Eliquis.   #3 history of rheumatoid arthritis on methotrexate   #4 type 2 diabetes complicated with neuropathy on metformin at home   #5 history of essential hypertension continue Norvasc and Micardis and Lasix.   #6 prolonged QT-improving QTc down to 476 from 574.  Continue to avoid QT prolonging agents as much as possible.   #7 hypokalemia and hypomagnesemia on discharge.    Estimated body mass index is 30.94 kg/m as calculated from the following:   Height as of this encounter: 5\' 8"  (1.727 m).   Weight as of this encounter: 92.3 kg.  Discharge Instructions  Discharge Instructions     Diet - low sodium heart healthy   Complete by: As directed  Increase activity slowly   Complete by: As directed       Allergies as of 12/25/2021       Reactions   Cephalexin Other (See Comments)   URINARY RETENTION = "blocked my urine"   Tapentadol    Other reaction(s): zombie Other reaction(s): zombie   Codeine Hives, Rash   Nucynta [tapentadol Hcl] Other (See Comments)   Altered mental status         Medication List     STOP taking these medications    oxybutynin 5 MG 24 hr tablet Commonly known as: DITROPAN-XL   simethicone 80 MG chewable tablet Commonly known as: MYLICON       TAKE these medications    acetaminophen 500 MG tablet Commonly known as: TYLENOL Take 1,000 mg by mouth every 6 (six) hours as needed for moderate pain or headache.   amLODipine 5 MG tablet Commonly known as: NORVASC Take 5 mg by mouth daily.   apixaban 5 MG Tabs tablet Commonly known as: ELIQUIS Take 5 mg by mouth 2 (two) times daily.   aspirin EC 81 MG tablet Take 81 mg by mouth daily. Swallow whole.   bethanechol 5 MG tablet Commonly known as: URECHOLINE Take 1 tablet (5 mg total) by mouth 3 (three) times daily.   docusate sodium 100 MG capsule Commonly known as: COLACE Take 2 capsules (200 mg total) by mouth 3 (three) times daily. What changed:  how much to take when to take this   DULoxetine 20 MG capsule Commonly known as: CYMBALTA Take 20 mg by mouth every morning.   folic acid 1 MG tablet Commonly known as: FOLVITE Take 1 tablet (1 mg total) by mouth daily.   furosemide 20 MG tablet Commonly known as: LASIX Take 20 mg by mouth daily.   glycerin adult 2 g suppository Place 1 suppository rectally as needed for constipation.   linaclotide 290 MCG Caps capsule Commonly known as: LINZESS Take 1 capsule (290 mcg total) by mouth daily before breakfast.   metFORMIN 500 MG 24 hr tablet Commonly known as: GLUCOPHAGE-XR Take 1,000 mg by mouth daily with breakfast.   methotrexate 2.5 MG tablet Commonly known as: RHEUMATREX Take 8 tablets (20 mg total) by mouth once a week. 8 tablets weekly (Sunday) What changed:  when to take this additional instructions   polyethylene glycol 17 g packet Commonly known as: MIRALAX / GLYCOLAX Take 17 g by mouth 3 (three) times daily. What changed: when to take this   rosuvastatin 10 MG tablet Commonly known as: CRESTOR Take 1  tablet (10 mg total) by mouth daily.   telmisartan 80 MG tablet Commonly known as: MICARDIS Take 80 mg by mouth at bedtime.        Allergies  Allergen Reactions   Cephalexin Other (See Comments)    URINARY RETENTION = "blocked my urine"   Tapentadol     Other reaction(s): zombie Other reaction(s): zombie   Codeine Hives and Rash   Nucynta [Tapentadol Hcl] Other (See Comments)    Altered mental status    Consultations: Dr Collene Mares   Procedures/Studies: DG Abd 1 View  Result Date: 12/25/2021 CLINICAL DATA:  Continued abdominal pain, nausea, vomiting, follow-up ileus EXAM: ABDOMEN - 1 VIEW COMPARISON:  Portable exam of 12/24/2021 FINDINGS: Vascular stent LEFT iliac. Gas throughout colon without colonic wall thickening. Decreased small bowel distension. Surgical clips RIGHT upper quadrant question cholecystectomy. Bones demineralized. IMPRESSION: Decreased small bowel distension. Electronically Signed   By: Crist Infante.D.  On: 12/25/2021 08:23   DG Abd 1 View  Result Date: 12/23/2021 CLINICAL DATA:  Fall ileus. EXAM: ABDOMEN - 1 VIEW COMPARISON:  12/22/2021 FINDINGS: There is gaseous distension of large and small bowel within the abdomen, which appears similar to slightly improved compared to the previous CT, when accounting for differences in technique. No gross free intraperitoneal air on AP portable supine view. Left iliac vascular stent. IMPRESSION: Persistent gaseous distension of large and small bowel within the abdomen, similar to slightly improved compared to previous CT. Electronically Signed   By: Davina Poke D.O.   On: 12/23/2021 15:15   CT ABDOMEN PELVIS W CONTRAST  Result Date: 12/22/2021 CLINICAL DATA:  Abdominal pain and nausea and vomiting for 2 days. Suspected bowel obstruction. EXAM: CT ABDOMEN AND PELVIS WITH CONTRAST TECHNIQUE: Multidetector CT imaging of the abdomen and pelvis was performed using the standard protocol following bolus administration of  intravenous contrast. RADIATION DOSE REDUCTION: This exam was performed according to the departmental dose-optimization program which includes automated exposure control, adjustment of the mA and/or kV according to patient size and/or use of iterative reconstruction technique. CONTRAST:  16mL OMNIPAQUE IOHEXOL 300 MG/ML  SOLN COMPARISON:  11/06/2021 FINDINGS: Lower Chest: No acute findings. Hepatobiliary: No hepatic masses identified. Prior cholecystectomy. No evidence of biliary obstruction. Pancreas:  No mass or inflammatory changes. Spleen: Within normal limits in size and appearance. Adrenals/Urinary Tract: No masses identified. No evidence of ureteral calculi or hydronephrosis. Stomach/Bowel: Moderate stool again seen throughout the colon, however the colon is nondilated. Moderate diffuse small bowel dilatation is seen containing air-fluid levels, without significant change since previous study. Fecal content is seen within distal small bowel loops including the terminal ileum, also without significant change. Normal appendix visualized. No focal inflammatory process identified. Vascular/Lymphatic: No pathologically enlarged lymph nodes. No acute vascular findings. Stent again seen within the left common iliac vein. Reproductive: Prior hysterectomy noted. Adnexal regions are unremarkable in appearance. Other: Mild ascites in the right upper quadrant and pelvis shows mild decrease since previous study. Musculoskeletal:  No suspicious bone lesions identified. IMPRESSION: No significant change in moderate small bowel dilatation with fecal content in distal ileum, and moderate stool burden throughout nondilated colon. Although no etiology for mechanical small-bowel obstruction is seen, differential diagnosis includes functional small bowel obstruction due to stool and adynamic ileus. Mild ascites, slightly decreased since previous study. Electronically Signed   By: Marlaine Hind M.D.   On: 12/22/2021 10:12   DG  Abd Portable 1V  Result Date: 12/24/2021 CLINICAL DATA:  Ileus EXAM: PORTABLE ABDOMEN - 1 VIEW COMPARISON:  Portable exam 0519 hours compared to 12/23/2021 FINDINGS: Vascular stent LEFT iliac. Slight gaseous distention of bowel loops in the mid abdomen again seen. No bowel wall thickening or evidence of obstruction. Surgical clips RIGHT upper quadrant. No acute osseous findings. IMPRESSION: Persistent gaseous distention of large and small bowel loops in mid abdomen. Electronically Signed   By: Lavonia Dana M.D.   On: 12/24/2021 08:20   VAS [place] LOWER EXTREMITY VENOUS (DVT)  Result Date: 12/24/2021  Lower Venous DVT Study Patient Name:  Diane [name]  Date of Exam:   12/24/2021 Medical Rec #: [id number]       Accession #:    [id number] Date of Birth: Aug 20, 1946       Patient Gender: F Patient Age:   39 years Exam Location:  [facility] Procedure:      VAS [place] LOWER EXTREMITY VENOUS (DVT) Referring Phys: Inda Merlin --------------------------------------------------------------------------------  Indications: Pain and swelling RT lower extremity, history of extensive LT lower extremity DVT.  Risk Factors: Surgery 06-21-2021 Left lower extremity thrombectomy. Anticoagulation: Eliquis. Comparison Study: 06-27-2020 Prior RIGHT lower extremity venous was negative for                   DVT. Multiple prior LEFT lower extremity venous studies with                   history of DVT. Performing Technologist: Darlin Coco RDMS, RVT  Examination Guidelines: A complete evaluation includes B-mode imaging, spectral Doppler, color Doppler, and power Doppler as needed of all accessible portions of each vessel. Bilateral testing is considered an integral part of a complete examination. Limited examinations for reoccurring indications may be performed as noted. The reflux portion of the exam is performed with the patient in reverse Trendelenburg.  +---------+---------------+---------+-----------+----------+--------------+   RIGHT     Compressibility Phasicity Spontaneity Properties Thrombus Aging  +---------+---------------+---------+-----------+----------+--------------+  CFV       Full            Yes       Yes                                    +---------+---------------+---------+-----------+----------+--------------+  SFJ       Full                                                             +---------+---------------+---------+-----------+----------+--------------+  FV Prox   Full                                                             +---------+---------------+---------+-----------+----------+--------------+  FV Mid    Full                                                             +---------+---------------+---------+-----------+----------+--------------+  FV Distal Full                                                             +---------+---------------+---------+-----------+----------+--------------+  PFV       Full                                                             +---------+---------------+---------+-----------+----------+--------------+  POP       Full            Yes       Yes                                    +---------+---------------+---------+-----------+----------+--------------+  PTV       Full                                                             +---------+---------------+---------+-----------+----------+--------------+  PERO      Full                                                             +---------+---------------+---------+-----------+----------+--------------+  Gastroc   Full                                                             +---------+---------------+---------+-----------+----------+--------------+   +----+---------------+---------+-----------+----------+--------------+  LEFT Compressibility Phasicity Spontaneity Properties Thrombus Aging  +----+---------------+---------+-----------+----------+--------------+  CFV  Full            Yes       Yes                                     +----+---------------+---------+-----------+----------+--------------+     Summary: RIGHT: - There is no evidence of deep vein thrombosis in the lower extremity.  - No cystic structure found in the popliteal fossa.  LEFT: - No evidence of common femoral vein obstruction.  *See table(s) above for measurements and observations. Electronically signed by Orlie Pollen on 12/24/2021 at 6:06:56 PM.    Final    (Echo, Carotid, EGD, Colonoscopy, ERCP)    Subjective:  Patient is resting in bed she is anxious to go home she is tolerating a full liquid diet and she tolerated a soft diet prior to discharge she had bowel movements and is having flatus Discharge Exam: Vitals:   12/25/21 0448 12/25/21 0915  BP: (!) 150/78 (!) 145/69  Pulse: 73 71  Resp: 18 18  Temp: 98.5 F (36.9 C) 98.8 F (37.1 C)  SpO2: 94% 99%   Vitals:   12/24/21 1635 12/24/21 2041 12/25/21 0448 12/25/21 0915  BP: (!) 168/90 (!) 169/75 (!) 150/78 (!) 145/69  Pulse: 82 83 73 71  Resp:  18 18 18   Temp: 99.3 F (37.4 C) 98.4 F (36.9 C) 98.5 F (36.9 C) 98.8 F (37.1 C)  TempSrc: Oral Oral  Oral  SpO2: 97% 96% 94% 99%  Weight:  92.3 kg    Height:        General: Pt is alert, awake, not in acute distress Cardiovascular: RRR, S1/S2 +, no rubs, no gallops Respiratory: CTA bilaterally, no wheezing, no rhonchi Abdominal: Soft, NT, ND, bowel sounds + Extremities: no edema, no cyanosis    The results of significant diagnostics from this hospitalization (including imaging, microbiology, ancillary and laboratory) are listed below for reference.     Microbiology: Recent Results (from the past 240 hour(s))  Resp Panel by RT-PCR (Flu A&B, Covid) Nasopharyngeal Swab     Status: None   Collection Time: 12/22/21 10:20 PM   Specimen:  Nasopharyngeal Swab; Nasopharyngeal(NP) swabs in vial transport medium  Result Value Ref Range Status   SARS Coronavirus 2 by RT PCR NEGATIVE NEGATIVE Final    Comment: (NOTE) SARS-CoV-2  target nucleic acids are NOT DETECTED.  The SARS-CoV-2 RNA is generally detectable in upper respiratory specimens during the acute phase of infection. The lowest concentration of SARS-CoV-2 viral copies this assay can detect is 138 copies/mL. A negative result does not preclude SARS-Cov-2 infection and should not be used as the sole basis for treatment or other patient management decisions. A negative result may occur with  improper specimen collection/handling, submission of specimen other than nasopharyngeal swab, presence of viral mutation(s) within the areas targeted by this assay, and inadequate number of viral copies(<138 copies/mL). A negative result must be combined with clinical observations, patient history, and epidemiological information. The expected result is Negative.  Fact Sheet for Patients:  EntrepreneurPulse.com.au  Fact Sheet for Healthcare Providers:  IncredibleEmployment.be  This test is no t yet approved or cleared by the [place] FDA and  has been authorized for detection and/or diagnosis of SARS-CoV-2 by FDA under an Emergency Use Authorization (EUA). This EUA will remain  in effect (meaning this test can be used) for the duration of the COVID-19 declaration under Section 564(b)(1) of the Act, 21 U.S.C.section 360bbb-3(b)(1), unless the authorization is terminated  or revoked sooner.       Influenza A by PCR NEGATIVE NEGATIVE Final   Influenza B by PCR NEGATIVE NEGATIVE Final    Comment: (NOTE) The Xpert Xpress SARS-CoV-2/FLU/RSV plus assay is intended as an aid in the diagnosis of influenza from Nasopharyngeal swab specimens and should not be used as a sole basis for treatment. Nasal washings and aspirates are unacceptable for Xpert Xpress SARS-CoV-2/FLU/RSV testing.  Fact Sheet for Patients: EntrepreneurPulse.com.au  Fact Sheet for Healthcare  Providers: IncredibleEmployment.be  This test is not yet approved or cleared by the [place] FDA and has been authorized for detection and/or diagnosis of SARS-CoV-2 by FDA under an Emergency Use Authorization (EUA). This EUA will remain in effect (meaning this test can be used) for the duration of the COVID-19 declaration under Section 564(b)(1) of the Act, 21 U.S.C. section 360bbb-3(b)(1), unless the authorization is terminated or revoked.  Performed at [facility], [place] [address] [place], [place] [address]      Labs: BNP (last 3 results) Recent Labs    07/02/21 1255  BNP 10.2   Basic Metabolic Panel: Recent Labs  Lab 12/22/21 0718 12/23/21 0330 12/24/21 0434 12/25/21 0509  NA 139 139 139 137  K 4.1 3.0* 3.7 3.8  CL 103 107 107 108  CO2 24 21* 23 22  GLUCOSE 223* 125* 108* 113*  BUN 11 11 6* <5*  CREATININE 0.79 0.48 0.53 0.61  CALCIUM 9.6 8.2* 8.3* 8.3*  MG  --  1.6* 1.7 2.1   Liver Function Tests: Recent Labs  Lab 12/22/21 0718 12/24/21 0434 12/25/21 0509  AST 19 13* 16  ALT 14 8 12   ALKPHOS 66 48 48  BILITOT 0.4 0.5 0.4  PROT 7.9 5.8* 6.0*  ALBUMIN 4.0 2.9* 3.0*   Recent Labs  Lab 12/22/21 0718  LIPASE 35   No results for input(s): AMMONIA in the last 168 hours. CBC: Recent Labs  Lab 12/22/21 0718 12/23/21 0330 12/24/21 0434 12/25/21 0509  WBC 11.5* 5.2 5.5 5.6  HGB 13.7 10.4* 9.8* 10.3*  HCT 43.9 33.8* 32.0* 33.9*  MCV 84.3 86.0 84.9 85.4  PLT 293  213 189 200   Cardiac Enzymes: No results for input(s): CKTOTAL, CKMB, CKMBINDEX, TROPONINI in the last 168 hours. BNP: Invalid input(s): POCBNP CBG: Recent Labs  Lab 12/24/21 1147 12/24/21 1636 12/24/21 2042 12/25/21 0720 12/25/21 1136  GLUCAP 115* 114* 131* 116* 103*   D-Dimer No results for input(s): DDIMER in the last 72 hours. Hgb A1c No results for input(s): HGBA1C in the last 72 hours. Lipid Profile No results for input(s): CHOL, HDL,  LDLCALC, TRIG, CHOLHDL, LDLDIRECT in the last 72 hours. Thyroid function studies No results for input(s): TSH, T4TOTAL, T3FREE, THYROIDAB in the last 72 hours.  Invalid input(s): FREET3 Anemia work up No results for input(s): VITAMINB12, FOLATE, FERRITIN, TIBC, IRON, RETICCTPCT in the last 72 hours. Urinalysis    Component Value Date/Time   COLORURINE YELLOW 12/22/2021 0733   APPEARANCEUR CLEAR 12/22/2021 0733   LABSPEC 1.017 12/22/2021 0733   PHURINE 5.0 12/22/2021 0733   GLUCOSEU 50 (A) 12/22/2021 0733   HGBUR NEGATIVE 12/22/2021 0733   BILIRUBINUR NEGATIVE 12/22/2021 0733   KETONESUR 5 (A) 12/22/2021 0733   PROTEINUR 100 (A) 12/22/2021 0733   UROBILINOGEN 0.2 06/15/2014 0524   NITRITE NEGATIVE 12/22/2021 0733   LEUKOCYTESUR NEGATIVE 12/22/2021 0733   Sepsis Labs Invalid input(s): PROCALCITONIN,  WBC,  LACTICIDVEN Microbiology Recent Results (from the past 240 hour(s))  Resp Panel by RT-PCR (Flu A&B, Covid) Nasopharyngeal Swab     Status: None   Collection Time: 12/22/21 10:20 PM   Specimen: Nasopharyngeal Swab; Nasopharyngeal(NP) swabs in vial transport medium  Result Value Ref Range Status   SARS Coronavirus 2 by RT PCR NEGATIVE NEGATIVE Final    Comment: (NOTE) SARS-CoV-2 target nucleic acids are NOT DETECTED.  The SARS-CoV-2 RNA is generally detectable in upper respiratory specimens during the acute phase of infection. The lowest concentration of SARS-CoV-2 viral copies this assay can detect is 138 copies/mL. A negative result does not preclude SARS-Cov-2 infection and should not be used as the sole basis for treatment or other patient management decisions. A negative result may occur with  improper specimen collection/handling, submission of specimen other than nasopharyngeal swab, presence of viral mutation(s) within the areas targeted by this assay, and inadequate number of viral copies(<138 copies/mL). A negative result must be combined with clinical  observations, patient history, and epidemiological information. The expected result is Negative.  Fact Sheet for Patients:  EntrepreneurPulse.com.au  Fact Sheet for Healthcare Providers:  IncredibleEmployment.be  This test is no t yet approved or cleared by the [place] FDA and  has been authorized for detection and/or diagnosis of SARS-CoV-2 by FDA under an Emergency Use Authorization (EUA). This EUA will remain  in effect (meaning this test can be used) for the duration of the COVID-19 declaration under Section 564(b)(1) of the Act, 21 U.S.C.section 360bbb-3(b)(1), unless the authorization is terminated  or revoked sooner.       Influenza A by PCR NEGATIVE NEGATIVE Final   Influenza B by PCR NEGATIVE NEGATIVE Final    Comment: (NOTE) The Xpert Xpress SARS-CoV-2/FLU/RSV plus assay is intended as an aid in the diagnosis of influenza from Nasopharyngeal swab specimens and should not be used as a sole basis for treatment. Nasal washings and aspirates are unacceptable for Xpert Xpress SARS-CoV-2/FLU/RSV testing.  Fact Sheet for Patients: EntrepreneurPulse.com.au  Fact Sheet for Healthcare Providers: IncredibleEmployment.be  This test is not yet approved or cleared by the [place] FDA and has been authorized for detection and/or diagnosis of SARS-CoV-2 by FDA under an Emergency Use  Authorization (EUA). This EUA will remain in effect (meaning this test can be used) for the duration of the COVID-19 declaration under Section 564(b)(1) of the Act, 21 U.S.C. section 360bbb-3(b)(1), unless the authorization is terminated or revoked.  Performed at [facility], [place] [address] [place], [place] [address]      Time coordinating discharge: 39 minutes  SIGNED:   Georgette Shell, MD  [facility] 12/25/2021, 3:18 PM

## 2021-12-25 NOTE — Care Management Important Message (Signed)
Important Message  Patient Details  Name: Diane [name] MRN: [id number] Date of Birth: [birth year]   Medicare Important Message Given:  Yes     [place] [address] 12/25/2021, 3:40 PM

## 2021-12-29 DIAGNOSIS — I10 Essential (primary) hypertension: Secondary | ICD-10-CM | POA: Diagnosis not present

## 2021-12-29 DIAGNOSIS — E782 Mixed hyperlipidemia: Secondary | ICD-10-CM | POA: Diagnosis not present

## 2021-12-29 DIAGNOSIS — E1165 Type 2 diabetes mellitus with hyperglycemia: Secondary | ICD-10-CM | POA: Diagnosis not present

## 2021-12-29 DIAGNOSIS — M1990 Unspecified osteoarthritis, unspecified site: Secondary | ICD-10-CM | POA: Diagnosis not present

## 2022-01-12 DIAGNOSIS — K5730 Diverticulosis of large intestine without perforation or abscess without bleeding: Secondary | ICD-10-CM | POA: Diagnosis not present

## 2022-01-12 DIAGNOSIS — K760 Fatty (change of) liver, not elsewhere classified: Secondary | ICD-10-CM | POA: Diagnosis not present

## 2022-01-12 DIAGNOSIS — K5904 Chronic idiopathic constipation: Secondary | ICD-10-CM | POA: Diagnosis not present

## 2022-01-14 DIAGNOSIS — H43393 Other vitreous opacities, bilateral: Secondary | ICD-10-CM | POA: Diagnosis not present

## 2022-01-14 DIAGNOSIS — H31093 Other chorioretinal scars, bilateral: Secondary | ICD-10-CM | POA: Diagnosis not present

## 2022-01-14 DIAGNOSIS — H43813 Vitreous degeneration, bilateral: Secondary | ICD-10-CM | POA: Diagnosis not present

## 2022-01-29 DIAGNOSIS — E1165 Type 2 diabetes mellitus with hyperglycemia: Secondary | ICD-10-CM | POA: Diagnosis not present

## 2022-01-29 DIAGNOSIS — E782 Mixed hyperlipidemia: Secondary | ICD-10-CM | POA: Diagnosis not present

## 2022-01-29 DIAGNOSIS — I10 Essential (primary) hypertension: Secondary | ICD-10-CM | POA: Diagnosis not present

## 2022-01-29 DIAGNOSIS — M1990 Unspecified osteoarthritis, unspecified site: Secondary | ICD-10-CM | POA: Diagnosis not present

## 2022-02-15 DIAGNOSIS — M0579 Rheumatoid arthritis with rheumatoid factor of multiple sites without organ or systems involvement: Secondary | ICD-10-CM | POA: Diagnosis not present

## 2022-02-15 DIAGNOSIS — M1990 Unspecified osteoarthritis, unspecified site: Secondary | ICD-10-CM | POA: Diagnosis not present

## 2022-02-15 DIAGNOSIS — M7989 Other specified soft tissue disorders: Secondary | ICD-10-CM | POA: Diagnosis not present

## 2022-02-15 DIAGNOSIS — K760 Fatty (change of) liver, not elsewhere classified: Secondary | ICD-10-CM | POA: Diagnosis not present

## 2022-02-15 DIAGNOSIS — Z79899 Other long term (current) drug therapy: Secondary | ICD-10-CM | POA: Diagnosis not present

## 2022-02-15 DIAGNOSIS — D509 Iron deficiency anemia, unspecified: Secondary | ICD-10-CM | POA: Diagnosis not present

## 2022-02-15 DIAGNOSIS — R252 Cramp and spasm: Secondary | ICD-10-CM | POA: Diagnosis not present

## 2022-02-15 DIAGNOSIS — M349 Systemic sclerosis, unspecified: Secondary | ICD-10-CM | POA: Diagnosis not present

## 2022-02-15 DIAGNOSIS — M79643 Pain in unspecified hand: Secondary | ICD-10-CM | POA: Diagnosis not present

## 2022-02-23 DIAGNOSIS — R519 Headache, unspecified: Secondary | ICD-10-CM | POA: Diagnosis not present

## 2022-02-23 DIAGNOSIS — G479 Sleep disorder, unspecified: Secondary | ICD-10-CM | POA: Diagnosis not present

## 2022-02-23 DIAGNOSIS — R29818 Other symptoms and signs involving the nervous system: Secondary | ICD-10-CM | POA: Diagnosis not present

## 2022-02-24 DIAGNOSIS — Z96611 Presence of right artificial shoulder joint: Secondary | ICD-10-CM | POA: Diagnosis not present

## 2022-02-28 DIAGNOSIS — E1165 Type 2 diabetes mellitus with hyperglycemia: Secondary | ICD-10-CM | POA: Diagnosis not present

## 2022-02-28 DIAGNOSIS — M1990 Unspecified osteoarthritis, unspecified site: Secondary | ICD-10-CM | POA: Diagnosis not present

## 2022-02-28 DIAGNOSIS — E782 Mixed hyperlipidemia: Secondary | ICD-10-CM | POA: Diagnosis not present

## 2022-02-28 DIAGNOSIS — I10 Essential (primary) hypertension: Secondary | ICD-10-CM | POA: Diagnosis not present

## 2022-03-02 ENCOUNTER — Other Ambulatory Visit: Payer: Self-pay | Admitting: *Deleted

## 2022-03-02 DIAGNOSIS — I82402 Acute embolism and thrombosis of unspecified deep veins of left lower extremity: Secondary | ICD-10-CM

## 2022-03-08 DIAGNOSIS — I700 Atherosclerosis of aorta: Secondary | ICD-10-CM | POA: Diagnosis not present

## 2022-03-08 DIAGNOSIS — E1165 Type 2 diabetes mellitus with hyperglycemia: Secondary | ICD-10-CM | POA: Diagnosis not present

## 2022-03-11 DIAGNOSIS — R519 Headache, unspecified: Secondary | ICD-10-CM | POA: Diagnosis not present

## 2022-03-11 DIAGNOSIS — G479 Sleep disorder, unspecified: Secondary | ICD-10-CM | POA: Diagnosis not present

## 2022-03-11 DIAGNOSIS — R29818 Other symptoms and signs involving the nervous system: Secondary | ICD-10-CM | POA: Diagnosis not present

## 2022-03-15 ENCOUNTER — Encounter: Payer: Self-pay | Admitting: Surgery

## 2022-03-15 ENCOUNTER — Ambulatory Visit (HOSPITAL_COMMUNITY)
Admission: RE | Admit: 2022-03-15 | Discharge: 2022-03-15 | Disposition: A | Discharge status: Home / Self Care | Payer: Medicare Other | Source: Ambulatory Visit | Attending: Surgery | Admitting: Surgery

## 2022-03-15 ENCOUNTER — Ambulatory Visit: Payer: Medicare Other | Admitting: Surgery

## 2022-03-15 VITALS — BP 148/78 | HR 81 | Temp 98.1°F | Resp 20 | Ht 68.0 in | Wt 209.6 lb

## 2022-03-15 DIAGNOSIS — I82402 Acute embolism and thrombosis of unspecified deep veins of left lower extremity: Secondary | ICD-10-CM | POA: Diagnosis not present

## 2022-03-15 NOTE — Progress Notes (Signed)
? ?[facility] ? ?[name] MRN: [id number] DOB: [birth year] Sex: female ? ? ?REASON FOR VISIT:  ? ? ?Follow up ? ?HISOTRY OF PRESENT ILLNESS:  ? ? ?Diane [name] is a 76 y.o. female who is a former patient of Dr. Oneida [name].  She initially presented on 02/06/2021 with left leg DVT.  She underwent left leg thrombectomy and subsequent left common and external iliac venous stents (16 x 90 x 2) she stopped her Eliquis for several days and developed a recurrent DVT which was treated by Dr. Donzetta [name] on 06/01/2021 with mechanical thrombectomy. ? ? ?PAST MEDICAL HISTORY:  ? ?Past Medical History:  ?Diagnosis Date  ? Abnormal nuclear stress test 10/18/2013  ? Arm pain 10/18/2013  ? Arthritis   ? Cataract   ? RIGHT EYE  ? Chest pain 10/18/2013  ? Diabetes mellitus   ? dx over 5 yrs ago  ? GERD (gastroesophageal reflux disease)   ? Heart murmur   ? "yrs ago in [place] -- been here since 1999, "  ? History of hiatal hernia   ? Hypertension   ? Prolonged Q-T interval on ECG 04/20/2017  ? Pulmonary edema 04/20/2017  ? ? ? ?FAMILY HISTORY:  ? ?Family History  ?Family history unknown: Yes  ? ? ?SOCIAL HISTORY:  ? ?Social History  ? ?Tobacco Use  ? Smoking status: Never  ?  Passive exposure: Never  ? Smokeless tobacco: Never  ?Substance Use Topics  ? Alcohol use: Never  ? ? ? ?ALLERGIES:  ? ?Allergies  ?Allergen Reactions  ? Cephalexin Other (See Comments)  ?  URINARY RETENTION = "blocked my urine"  ? Tapentadol   ?  Other reaction(s): zombie ?Other reaction(s): zombie  ? Codeine Hives and Rash  ? Nucynta [Tapentadol Hcl] Other (See Comments)  ?  Altered mental status  ? ? ? ?CURRENT MEDICATIONS:  ? ?Current Outpatient Medications  ?Medication Sig Dispense Refill  ? acetaminophen (TYLENOL) 500 MG tablet Take 1,000 mg by mouth every 6 (six) hours as needed for moderate pain or headache.    ? amLODipine (NORVASC) 5 MG tablet Take 5 mg by mouth daily.    ? apixaban  (ELIQUIS) 5 MG TABS tablet Take 5 mg by mouth 2 (two) times daily.    ? aspirin EC 81 MG tablet Take 81 mg by mouth daily. Swallow whole.    ? bethanechol (URECHOLINE) 5 MG tablet Take 1 tablet (5 mg total) by mouth 3 (three) times daily. 90 tablet 2  ? docusate sodium (COLACE) 100 MG capsule Take 2 capsules (200 mg total) by mouth 3 (three) times daily. 60 capsule 0  ? DULoxetine (CYMBALTA) 20 MG capsule Take 20 mg by mouth every morning.    ? folic acid (FOLVITE) 1 MG tablet Take 1 tablet (1 mg total) by mouth daily. 30 tablet 0  ? furosemide (LASIX) 20 MG tablet Take 20 mg by mouth daily.    ? glycerin adult 2 g suppository Place 1 suppository rectally as needed for constipation. 30 suppository 0  ? linaclotide (LINZESS) 290 MCG CAPS capsule Take 1 capsule (290 mcg total) by mouth daily before breakfast. 30 capsule 2  ? metFORMIN (GLUCOPHAGE-XR) 500 MG 24 hr tablet Take 1,000 mg by mouth daily with breakfast.    ? methotrexate (RHEUMATREX) 2.5 MG tablet Take 8 tablets (20 mg total) by mouth once a week. 8 tablets weekly (Sunday) (Patient taking differently: Take 20 mg by mouth every  Sunday. 8 tabs) 32 tablet 0  ? polyethylene glycol (MIRALAX / GLYCOLAX) 17 g packet Take 17 g by mouth 3 (three) times daily. 14 each 0  ? rosuvastatin (CRESTOR) 10 MG tablet Take 1 tablet (10 mg total) by mouth daily. 30 tablet 0  ? telmisartan (MICARDIS) 80 MG tablet Take 80 mg by mouth at bedtime.    ? ?No current facility-administered medications for this visit.  ? ? ?REVIEW OF SYSTEMS:  ? ?'[X]'$  denotes positive finding, '[ ]'$  denotes negative finding ?Cardiac  Comments:  ?Chest pain or chest pressure:    ?Shortness of breath upon exertion:    ?Short of breath when lying flat:    ?Irregular heart rhythm:    ?    ?Vascular    ?Pain in calf, thigh, or hip brought on by ambulation:    ?Pain in feet at night that wakes you up from your sleep:     ?Blood clot in your veins:    ?Leg swelling:     ?    ?Pulmonary    ?Oxygen at home:     ?Productive cough:     ?Wheezing:     ?    ?Neurologic    ?Sudden weakness in arms or legs:     ?Sudden numbness in arms or legs:     ?Sudden onset of difficulty speaking or slurred speech:    ?Temporary loss of vision in one eye:     ?Problems with dizziness:     ?    ?Gastrointestinal    ?Blood in stool:     ?Vomited blood:     ?    ?Genitourinary    ?Burning when urinating:     ?Blood in urine:    ?    ?Psychiatric    ?Major depression:     ?    ?Hematologic    ?Bleeding problems:    ?Problems with blood clotting too easily:    ?    ?Skin    ?Rashes or ulcers:    ?    ?Constitutional    ?Fever or chills:    ? ? ?PHYSICAL EXAM:  ? ?Vitals:  ? 03/15/22 1030  ?BP: (!) 148/78  ?Pulse: 81  ?Resp: 20  ?Temp: 98.1 ?F (36.7 ?C)  ?TempSrc: Temporal  ?SpO2: 95%  ?Weight: 209 lb 9.6 oz (95.1 kg)  ?Height: '5\' 8"'$  (1.727 m)  ? ? ?GENERAL: The patient is a well-nourished female, in no acute distress. The vital signs are documented above. ?CARDIAC: There is a regular rate and rhythm.  ?VASCULAR: Bilateral edema with hyperpigmentation ?PULMONARY: Non-labored respirations ?MUSCULOSKELETAL: There are no major deformities or cyanosis. ?NEUROLOGIC: No focal weakness or paresthesias are detected. ?SKIN: There are no ulcers or rashes noted. ?PSYCHIATRIC: The patient has a normal affect. ? ?STUDIES:  ? ?I have reviewed the following: ? ?IVC/Iliac: Chronic appearing thrombus in the CFV and EIV stents. Native  ?EIV appears patent. IVC appears patent.  ? ?MEDICAL ISSUES:  ? ?Left leg DVT: Patient was initially diagnosed with May Thurner syndrome and treated with stenting and Eliquis.  She came off her Eliquis and had recurrent thrombosis.  Dr. Oneida [name] had contemplated stopping her Eliquis however I am a little reluctant to do so.  She also has bilateral leg swelling which could be related to venous insufficiency, however I would not recommend laser ablation in the setting of DVT.  And encouraged her to continue to wear bilateral leg  compression socks which she does regularly.  She will follow-up in 1 year for repeat ultrasound of her stents. ? ? ? ?Diane Major, IV, MD, FACS ?[facility] ?Tel [phone] ?Pager [phone]  ?

## 2022-03-17 DIAGNOSIS — M341 CR(E)ST syndrome: Secondary | ICD-10-CM | POA: Diagnosis not present

## 2022-03-17 DIAGNOSIS — E1142 Type 2 diabetes mellitus with diabetic polyneuropathy: Secondary | ICD-10-CM | POA: Diagnosis not present

## 2022-03-17 DIAGNOSIS — M349 Systemic sclerosis, unspecified: Secondary | ICD-10-CM | POA: Diagnosis not present

## 2022-03-17 DIAGNOSIS — K5904 Chronic idiopathic constipation: Secondary | ICD-10-CM | POA: Diagnosis not present

## 2022-03-31 DIAGNOSIS — M1990 Unspecified osteoarthritis, unspecified site: Secondary | ICD-10-CM | POA: Diagnosis not present

## 2022-03-31 DIAGNOSIS — E1165 Type 2 diabetes mellitus with hyperglycemia: Secondary | ICD-10-CM | POA: Diagnosis not present

## 2022-03-31 DIAGNOSIS — E782 Mixed hyperlipidemia: Secondary | ICD-10-CM | POA: Diagnosis not present

## 2022-03-31 DIAGNOSIS — I10 Essential (primary) hypertension: Secondary | ICD-10-CM | POA: Diagnosis not present

## 2022-04-15 DIAGNOSIS — R29818 Other symptoms and signs involving the nervous system: Secondary | ICD-10-CM | POA: Diagnosis not present

## 2022-04-15 DIAGNOSIS — G479 Sleep disorder, unspecified: Secondary | ICD-10-CM | POA: Diagnosis not present

## 2022-04-15 DIAGNOSIS — R519 Headache, unspecified: Secondary | ICD-10-CM | POA: Diagnosis not present

## 2022-04-22 ENCOUNTER — Other Ambulatory Visit: Payer: Self-pay | Admitting: Internal Medicine

## 2022-04-25 DIAGNOSIS — Z91048 Other nonmedicinal substance allergy status: Secondary | ICD-10-CM | POA: Diagnosis not present

## 2022-04-25 DIAGNOSIS — Z885 Allergy status to narcotic agent status: Secondary | ICD-10-CM | POA: Diagnosis not present

## 2022-04-25 DIAGNOSIS — Z881 Allergy status to other antibiotic agents status: Secondary | ICD-10-CM | POA: Diagnosis not present

## 2022-04-25 DIAGNOSIS — K219 Gastro-esophageal reflux disease without esophagitis: Secondary | ICD-10-CM | POA: Diagnosis not present

## 2022-04-25 DIAGNOSIS — I5032 Chronic diastolic (congestive) heart failure: Secondary | ICD-10-CM | POA: Diagnosis not present

## 2022-04-25 DIAGNOSIS — I252 Old myocardial infarction: Secondary | ICD-10-CM | POA: Diagnosis not present

## 2022-04-25 DIAGNOSIS — I110 Hypertensive heart disease with heart failure: Secondary | ICD-10-CM | POA: Diagnosis not present

## 2022-04-25 DIAGNOSIS — Z4659 Encounter for fitting and adjustment of other gastrointestinal appliance and device: Secondary | ICD-10-CM | POA: Diagnosis not present

## 2022-04-25 DIAGNOSIS — Z86718 Personal history of other venous thrombosis and embolism: Secondary | ICD-10-CM | POA: Diagnosis not present

## 2022-04-25 DIAGNOSIS — K56609 Unspecified intestinal obstruction, unspecified as to partial versus complete obstruction: Secondary | ICD-10-CM | POA: Diagnosis not present

## 2022-04-25 DIAGNOSIS — Z888 Allergy status to other drugs, medicaments and biological substances status: Secondary | ICD-10-CM | POA: Diagnosis not present

## 2022-04-25 DIAGNOSIS — E119 Type 2 diabetes mellitus without complications: Secondary | ICD-10-CM | POA: Diagnosis not present

## 2022-04-25 DIAGNOSIS — R1110 Vomiting, unspecified: Secondary | ICD-10-CM | POA: Diagnosis not present

## 2022-04-25 DIAGNOSIS — R1084 Generalized abdominal pain: Secondary | ICD-10-CM | POA: Diagnosis not present

## 2022-04-25 DIAGNOSIS — K5909 Other constipation: Secondary | ICD-10-CM | POA: Diagnosis not present

## 2022-04-26 DIAGNOSIS — R1084 Generalized abdominal pain: Secondary | ICD-10-CM | POA: Diagnosis not present

## 2022-04-26 DIAGNOSIS — R1110 Vomiting, unspecified: Secondary | ICD-10-CM | POA: Diagnosis not present

## 2022-04-26 DIAGNOSIS — K56609 Unspecified intestinal obstruction, unspecified as to partial versus complete obstruction: Secondary | ICD-10-CM | POA: Diagnosis not present

## 2022-04-27 DIAGNOSIS — R1084 Generalized abdominal pain: Secondary | ICD-10-CM | POA: Diagnosis not present

## 2022-04-27 DIAGNOSIS — K56609 Unspecified intestinal obstruction, unspecified as to partial versus complete obstruction: Secondary | ICD-10-CM | POA: Diagnosis not present

## 2022-04-28 ENCOUNTER — Other Ambulatory Visit: Payer: Self-pay | Admitting: Internal Medicine

## 2022-04-28 DIAGNOSIS — N644 Mastodynia: Secondary | ICD-10-CM

## 2022-04-28 DIAGNOSIS — R1084 Generalized abdominal pain: Secondary | ICD-10-CM | POA: Diagnosis not present

## 2022-04-30 DIAGNOSIS — K5904 Chronic idiopathic constipation: Secondary | ICD-10-CM | POA: Diagnosis not present

## 2022-04-30 DIAGNOSIS — N3281 Overactive bladder: Secondary | ICD-10-CM | POA: Diagnosis not present

## 2022-04-30 DIAGNOSIS — I10 Essential (primary) hypertension: Secondary | ICD-10-CM | POA: Diagnosis not present

## 2022-05-11 DIAGNOSIS — R1084 Generalized abdominal pain: Secondary | ICD-10-CM | POA: Diagnosis not present

## 2022-05-11 DIAGNOSIS — K56600 Partial intestinal obstruction, unspecified as to cause: Secondary | ICD-10-CM | POA: Diagnosis not present

## 2022-05-24 DIAGNOSIS — M0579 Rheumatoid arthritis with rheumatoid factor of multiple sites without organ or systems involvement: Secondary | ICD-10-CM | POA: Diagnosis not present

## 2022-05-24 DIAGNOSIS — M1990 Unspecified osteoarthritis, unspecified site: Secondary | ICD-10-CM | POA: Diagnosis not present

## 2022-05-24 DIAGNOSIS — M79643 Pain in unspecified hand: Secondary | ICD-10-CM | POA: Diagnosis not present

## 2022-05-24 DIAGNOSIS — D509 Iron deficiency anemia, unspecified: Secondary | ICD-10-CM | POA: Diagnosis not present

## 2022-05-24 DIAGNOSIS — M7989 Other specified soft tissue disorders: Secondary | ICD-10-CM | POA: Diagnosis not present

## 2022-05-24 DIAGNOSIS — Z79899 Other long term (current) drug therapy: Secondary | ICD-10-CM | POA: Diagnosis not present

## 2022-05-24 DIAGNOSIS — R252 Cramp and spasm: Secondary | ICD-10-CM | POA: Diagnosis not present

## 2022-05-24 DIAGNOSIS — M545 Low back pain, unspecified: Secondary | ICD-10-CM | POA: Diagnosis not present

## 2022-05-24 DIAGNOSIS — M7061 Trochanteric bursitis, right hip: Secondary | ICD-10-CM | POA: Diagnosis not present

## 2022-05-24 DIAGNOSIS — K760 Fatty (change of) liver, not elsewhere classified: Secondary | ICD-10-CM | POA: Diagnosis not present

## 2022-05-24 DIAGNOSIS — M25551 Pain in right hip: Secondary | ICD-10-CM | POA: Diagnosis not present

## 2022-05-24 DIAGNOSIS — M79651 Pain in right thigh: Secondary | ICD-10-CM | POA: Diagnosis not present

## 2022-05-24 DIAGNOSIS — M349 Systemic sclerosis, unspecified: Secondary | ICD-10-CM | POA: Diagnosis not present

## 2022-05-27 DIAGNOSIS — R519 Headache, unspecified: Secondary | ICD-10-CM | POA: Diagnosis not present

## 2022-05-27 DIAGNOSIS — R29818 Other symptoms and signs involving the nervous system: Secondary | ICD-10-CM | POA: Diagnosis not present

## 2022-05-27 DIAGNOSIS — G479 Sleep disorder, unspecified: Secondary | ICD-10-CM | POA: Diagnosis not present

## 2022-05-31 ENCOUNTER — Other Ambulatory Visit: Payer: Self-pay | Admitting: Physician Assistant

## 2022-05-31 DIAGNOSIS — I10 Essential (primary) hypertension: Secondary | ICD-10-CM | POA: Diagnosis not present

## 2022-05-31 DIAGNOSIS — K5904 Chronic idiopathic constipation: Secondary | ICD-10-CM | POA: Diagnosis not present

## 2022-05-31 DIAGNOSIS — N3281 Overactive bladder: Secondary | ICD-10-CM | POA: Diagnosis not present

## 2022-06-02 ENCOUNTER — Other Ambulatory Visit: Payer: Self-pay | Admitting: Physician Assistant

## 2022-06-02 DIAGNOSIS — H9203 Otalgia, bilateral: Secondary | ICD-10-CM

## 2022-06-02 DIAGNOSIS — R200 Anesthesia of skin: Secondary | ICD-10-CM

## 2022-06-02 DIAGNOSIS — G500 Trigeminal neuralgia: Secondary | ICD-10-CM

## 2022-06-02 DIAGNOSIS — R519 Headache, unspecified: Secondary | ICD-10-CM

## 2022-06-07 ENCOUNTER — Ambulatory Visit: Payer: Medicare Other

## 2022-06-07 ENCOUNTER — Ambulatory Visit
Admission: RE | Admit: 2022-06-07 | Discharge: 2022-06-07 | Disposition: A | Discharge status: Home / Self Care | Payer: Medicare Other | Source: Ambulatory Visit | Attending: Internal Medicine | Admitting: Internal Medicine

## 2022-06-07 DIAGNOSIS — N644 Mastodynia: Secondary | ICD-10-CM | POA: Diagnosis not present

## 2022-06-15 ENCOUNTER — Ambulatory Visit
Admission: RE | Admit: 2022-06-15 | Discharge: 2022-06-15 | Disposition: A | Discharge status: Home / Self Care | Payer: Medicare Other | Source: Ambulatory Visit | Attending: Physician Assistant | Admitting: Physician Assistant

## 2022-06-15 DIAGNOSIS — R202 Paresthesia of skin: Secondary | ICD-10-CM | POA: Insufficient documentation

## 2022-06-15 DIAGNOSIS — R519 Headache, unspecified: Secondary | ICD-10-CM | POA: Diagnosis not present

## 2022-06-15 DIAGNOSIS — R200 Anesthesia of skin: Secondary | ICD-10-CM | POA: Diagnosis not present

## 2022-06-15 DIAGNOSIS — H9203 Otalgia, bilateral: Secondary | ICD-10-CM | POA: Diagnosis not present

## 2022-06-15 DIAGNOSIS — G500 Trigeminal neuralgia: Secondary | ICD-10-CM | POA: Diagnosis not present

## 2022-06-15 MED ORDER — GADOBUTROL 1 MMOL/ML IV SOLN
9.0000 mL | Freq: Once | INTRAVENOUS | Status: AC | PRN
  Administered 2022-06-15: 9 mL via INTRAVENOUS

## 2022-06-16 DIAGNOSIS — E1165 Type 2 diabetes mellitus with hyperglycemia: Secondary | ICD-10-CM | POA: Diagnosis not present

## 2022-06-16 DIAGNOSIS — I10 Essential (primary) hypertension: Secondary | ICD-10-CM | POA: Diagnosis not present

## 2022-06-16 DIAGNOSIS — E782 Mixed hyperlipidemia: Secondary | ICD-10-CM | POA: Diagnosis not present

## 2022-06-16 DIAGNOSIS — Z780 Asymptomatic menopausal state: Secondary | ICD-10-CM | POA: Diagnosis not present

## 2022-07-14 ENCOUNTER — Ambulatory Visit: Payer: Medicare Other | Admitting: Podiatry

## 2022-07-14 ENCOUNTER — Encounter: Payer: Self-pay | Admitting: Podiatry

## 2022-07-14 DIAGNOSIS — L84 Corns and callosities: Secondary | ICD-10-CM | POA: Diagnosis not present

## 2022-07-14 DIAGNOSIS — E1142 Type 2 diabetes mellitus with diabetic polyneuropathy: Secondary | ICD-10-CM

## 2022-07-14 NOTE — Progress Notes (Signed)
This patient returns to my office for at risk foot care.  This patient requires this care by a professional since this patient will be at risk due to having  diabetes.She has burning due to callus on both feet.  She has not been seen in over 1 1/2 yrs. This patient presents for at risk foot care today.  General Appearance  Alert, conversant and in no acute stress.  Vascular  Dorsalis pedis and posterior tibial  pulses are palpable  bilaterally.  Capillary return is within normal limits  bilaterally. Temperature is within normal limits  bilaterally.  Neurologic  Senn-Weinstein monofilament wire test within normal limits  bilaterally. Muscle power within normal limits bilaterally.  Nails Thick disfigured discolored nails with subungual debris  from hallux to fifth toes bilaterally. No evidence of bacterial infection or drainage bilaterally.  Orthopedic  No limitations of motion  feet .  No crepitus or effusions noted.  No bony pathology or digital deformities noted.  Skin  normotropic skin with no porokeratosis noted bilaterally.  No signs of infections or ulcers noted. Callus sub 5  B/L and sub 1 right foot.    Onychomycosis  Pain in right toes  Pain in left toes  Consent was obtained for treatment procedures.   Mechanical debridement of nails 1-5  bilaterally performed with a nail nipper.  Filed with dremel without incident.  Discussed surgical consult with patient  as needed.   Return office visit    prn                 Told patient to return for periodic foot care and evaluation due to potential at risk complications.   Gardiner Barefoot DPM

## 2022-08-10 ENCOUNTER — Other Ambulatory Visit (HOSPITAL_COMMUNITY): Payer: Self-pay | Admitting: Internal Medicine

## 2022-08-10 ENCOUNTER — Other Ambulatory Visit: Payer: Self-pay | Admitting: Internal Medicine

## 2022-08-10 DIAGNOSIS — K56609 Unspecified intestinal obstruction, unspecified as to partial versus complete obstruction: Secondary | ICD-10-CM | POA: Diagnosis not present

## 2022-08-10 DIAGNOSIS — K5904 Chronic idiopathic constipation: Secondary | ICD-10-CM | POA: Diagnosis not present

## 2022-08-10 DIAGNOSIS — M341 CR(E)ST syndrome: Secondary | ICD-10-CM | POA: Diagnosis not present

## 2022-08-10 DIAGNOSIS — Z8739 Personal history of other diseases of the musculoskeletal system and connective tissue: Secondary | ICD-10-CM | POA: Diagnosis not present

## 2022-08-11 DIAGNOSIS — H9202 Otalgia, left ear: Secondary | ICD-10-CM | POA: Diagnosis not present

## 2022-08-13 DIAGNOSIS — H9203 Otalgia, bilateral: Secondary | ICD-10-CM | POA: Diagnosis not present

## 2022-08-18 ENCOUNTER — Inpatient Hospital Stay (HOSPITAL_COMMUNITY): Admission: RE | Admit: 2022-08-18 | Payer: Medicare Other | Source: Ambulatory Visit

## 2022-08-23 ENCOUNTER — Ambulatory Visit (HOSPITAL_COMMUNITY)
Admission: RE | Admit: 2022-08-23 | Discharge: 2022-08-23 | Disposition: A | Discharge status: Home / Self Care | Payer: Medicare HMO | Source: Ambulatory Visit | Attending: Internal Medicine | Admitting: Internal Medicine

## 2022-08-23 DIAGNOSIS — K5904 Chronic idiopathic constipation: Secondary | ICD-10-CM | POA: Diagnosis not present

## 2022-08-23 DIAGNOSIS — M341 CR(E)ST syndrome: Secondary | ICD-10-CM | POA: Diagnosis not present

## 2022-08-23 DIAGNOSIS — K56609 Unspecified intestinal obstruction, unspecified as to partial versus complete obstruction: Secondary | ICD-10-CM | POA: Diagnosis not present

## 2022-08-26 DIAGNOSIS — R252 Cramp and spasm: Secondary | ICD-10-CM | POA: Diagnosis not present

## 2022-08-26 DIAGNOSIS — M1990 Unspecified osteoarthritis, unspecified site: Secondary | ICD-10-CM | POA: Diagnosis not present

## 2022-08-26 DIAGNOSIS — M79643 Pain in unspecified hand: Secondary | ICD-10-CM | POA: Diagnosis not present

## 2022-08-26 DIAGNOSIS — K760 Fatty (change of) liver, not elsewhere classified: Secondary | ICD-10-CM | POA: Diagnosis not present

## 2022-08-26 DIAGNOSIS — Z79899 Other long term (current) drug therapy: Secondary | ICD-10-CM | POA: Diagnosis not present

## 2022-08-26 DIAGNOSIS — M349 Systemic sclerosis, unspecified: Secondary | ICD-10-CM | POA: Diagnosis not present

## 2022-08-26 DIAGNOSIS — M7989 Other specified soft tissue disorders: Secondary | ICD-10-CM | POA: Diagnosis not present

## 2022-08-26 DIAGNOSIS — M0579 Rheumatoid arthritis with rheumatoid factor of multiple sites without organ or systems involvement: Secondary | ICD-10-CM | POA: Diagnosis not present

## 2022-08-26 DIAGNOSIS — D509 Iron deficiency anemia, unspecified: Secondary | ICD-10-CM | POA: Diagnosis not present

## 2022-09-16 DIAGNOSIS — R21 Rash and other nonspecific skin eruption: Secondary | ICD-10-CM | POA: Diagnosis not present

## 2022-09-16 DIAGNOSIS — M792 Neuralgia and neuritis, unspecified: Secondary | ICD-10-CM | POA: Diagnosis not present

## 2022-10-05 DIAGNOSIS — Z7901 Long term (current) use of anticoagulants: Secondary | ICD-10-CM | POA: Diagnosis not present

## 2022-10-05 DIAGNOSIS — R5383 Other fatigue: Secondary | ICD-10-CM | POA: Diagnosis not present

## 2022-10-05 DIAGNOSIS — Z8739 Personal history of other diseases of the musculoskeletal system and connective tissue: Secondary | ICD-10-CM | POA: Diagnosis not present

## 2022-10-05 DIAGNOSIS — K5904 Chronic idiopathic constipation: Secondary | ICD-10-CM | POA: Diagnosis not present

## 2022-10-05 DIAGNOSIS — E1142 Type 2 diabetes mellitus with diabetic polyneuropathy: Secondary | ICD-10-CM | POA: Diagnosis not present

## 2022-10-05 DIAGNOSIS — M341 CR(E)ST syndrome: Secondary | ICD-10-CM | POA: Diagnosis not present

## 2022-10-05 DIAGNOSIS — K56609 Unspecified intestinal obstruction, unspecified as to partial versus complete obstruction: Secondary | ICD-10-CM | POA: Diagnosis not present

## 2022-10-05 DIAGNOSIS — M349 Systemic sclerosis, unspecified: Secondary | ICD-10-CM | POA: Diagnosis not present

## 2022-10-06 DIAGNOSIS — Z0000 Encounter for general adult medical examination without abnormal findings: Secondary | ICD-10-CM | POA: Diagnosis not present

## 2022-10-06 DIAGNOSIS — Z23 Encounter for immunization: Secondary | ICD-10-CM | POA: Diagnosis not present

## 2022-10-08 ENCOUNTER — Telehealth: Payer: Self-pay | Admitting: *Deleted

## 2022-10-08 ENCOUNTER — Encounter: Payer: Self-pay | Admitting: Podiatry

## 2022-10-08 ENCOUNTER — Ambulatory Visit: Payer: Medicare HMO | Admitting: Podiatry

## 2022-10-08 ENCOUNTER — Ambulatory Visit: Payer: Medicare Other | Admitting: Podiatry

## 2022-10-08 VITALS — BP 153/80

## 2022-10-08 DIAGNOSIS — E1142 Type 2 diabetes mellitus with diabetic polyneuropathy: Secondary | ICD-10-CM

## 2022-10-08 DIAGNOSIS — L84 Corns and callosities: Secondary | ICD-10-CM

## 2022-10-08 NOTE — Telephone Encounter (Signed)
error 

## 2022-10-08 NOTE — Progress Notes (Signed)
This patient returns to my office for at risk foot care.  This patient requires this care by a professional since this patient will be at risk due to having  diabetes.She has burning due to callus on both feet.  She was seen by myself 3 months ago.  She now says she has severe pain noted from the calluses on her right foot. This patient presents for at risk foot care today.  General Appearance  Alert, conversant and in no acute stress.  Vascular  Dorsalis pedis and posterior tibial  pulses are palpable  bilaterally.  Capillary return is within normal limits  bilaterally. Temperature is within normal limits  bilaterally.  Neurologic  Senn-Weinstein monofilament wire test within normal limits  bilaterally. Muscle power within normal limits bilaterally.  Nails Thick disfigured discolored nails with subungual debris  from hallux to fifth toes bilaterally. No evidence of bacterial infection or drainage bilaterally. Nail polish on nails today.    Orthopedic  No limitations of motion  feet .  No crepitus or effusions noted.  No bony pathology or digital deformities noted.  Skin  normotropic skin with no porokeratosis noted bilaterally.  No signs of infections or ulcers noted. Callus sub 5  B/L and sub 1 right foot.    Onychomycosis  Pain in right toes  Pain in left toes  Consent was obtained for treatment procedures.   Mechanical debridement of nails 1-5  bilaterally performed with a nail nipper.  Filed with dremel without incident.  Discussed surgical consult with patient  as needed.Told her to make an appointment with Dr.  Paulla Dolly for fifth met head resection right foot.   Return office visit    3 months                Told patient to return for periodic foot care and evaluation due to potential at risk complications.   Gardiner Barefoot DPM

## 2022-10-13 DIAGNOSIS — Z23 Encounter for immunization: Secondary | ICD-10-CM | POA: Diagnosis not present

## 2022-10-13 DIAGNOSIS — M349 Systemic sclerosis, unspecified: Secondary | ICD-10-CM | POA: Diagnosis not present

## 2022-10-13 DIAGNOSIS — I82402 Acute embolism and thrombosis of unspecified deep veins of left lower extremity: Secondary | ICD-10-CM | POA: Diagnosis not present

## 2022-10-13 DIAGNOSIS — E1165 Type 2 diabetes mellitus with hyperglycemia: Secondary | ICD-10-CM | POA: Diagnosis not present

## 2022-10-13 DIAGNOSIS — M069 Rheumatoid arthritis, unspecified: Secondary | ICD-10-CM | POA: Diagnosis not present

## 2022-10-13 DIAGNOSIS — D6859 Other primary thrombophilia: Secondary | ICD-10-CM | POA: Diagnosis not present

## 2022-10-13 DIAGNOSIS — M341 CR(E)ST syndrome: Secondary | ICD-10-CM | POA: Diagnosis not present

## 2022-10-13 DIAGNOSIS — D692 Other nonthrombocytopenic purpura: Secondary | ICD-10-CM | POA: Diagnosis not present

## 2022-10-13 DIAGNOSIS — I700 Atherosclerosis of aorta: Secondary | ICD-10-CM | POA: Diagnosis not present

## 2022-10-13 DIAGNOSIS — E782 Mixed hyperlipidemia: Secondary | ICD-10-CM | POA: Diagnosis not present

## 2022-10-13 DIAGNOSIS — Z0000 Encounter for general adult medical examination without abnormal findings: Secondary | ICD-10-CM | POA: Diagnosis not present

## 2022-10-14 DIAGNOSIS — M79643 Pain in unspecified hand: Secondary | ICD-10-CM | POA: Diagnosis not present

## 2022-10-14 DIAGNOSIS — M349 Systemic sclerosis, unspecified: Secondary | ICD-10-CM | POA: Diagnosis not present

## 2022-10-14 DIAGNOSIS — Z79899 Other long term (current) drug therapy: Secondary | ICD-10-CM | POA: Diagnosis not present

## 2022-10-14 DIAGNOSIS — K760 Fatty (change of) liver, not elsewhere classified: Secondary | ICD-10-CM | POA: Diagnosis not present

## 2022-10-14 DIAGNOSIS — M1990 Unspecified osteoarthritis, unspecified site: Secondary | ICD-10-CM | POA: Diagnosis not present

## 2022-10-14 DIAGNOSIS — D509 Iron deficiency anemia, unspecified: Secondary | ICD-10-CM | POA: Diagnosis not present

## 2022-10-14 DIAGNOSIS — M0579 Rheumatoid arthritis with rheumatoid factor of multiple sites without organ or systems involvement: Secondary | ICD-10-CM | POA: Diagnosis not present

## 2022-10-14 DIAGNOSIS — M7989 Other specified soft tissue disorders: Secondary | ICD-10-CM | POA: Diagnosis not present

## 2022-10-14 DIAGNOSIS — R252 Cramp and spasm: Secondary | ICD-10-CM | POA: Diagnosis not present

## 2022-10-15 ENCOUNTER — Ambulatory Visit: Payer: Medicare HMO | Admitting: Podiatry

## 2022-10-20 ENCOUNTER — Encounter: Payer: Self-pay | Admitting: Podiatry

## 2022-10-20 ENCOUNTER — Ambulatory Visit (INDEPENDENT_AMBULATORY_CARE_PROVIDER_SITE_OTHER): Payer: Medicare HMO

## 2022-10-20 ENCOUNTER — Ambulatory Visit: Payer: Medicare HMO | Admitting: Podiatry

## 2022-10-20 DIAGNOSIS — L84 Corns and callosities: Secondary | ICD-10-CM | POA: Diagnosis not present

## 2022-10-20 DIAGNOSIS — M21621 Bunionette of right foot: Secondary | ICD-10-CM

## 2022-10-20 NOTE — Progress Notes (Signed)
Subjective:   Patient ID: Diane [name], female   DOB: 76 y.o.   MRN: [id number]   HPI Patient presents on referral from Dr. Prudence Davidson for chronic pain in the fifth metatarsal head right mild discomfort in the base of the first metatarsal but the fifth metatarsal is worse and has been chronically sore for her.  States her sugars been running well with her A1c around 7.0   ROS      Objective:  Physical Exam  Neuro neurovascular status found to be intact patient is noted to have exquisite discomfort in the fifth metatarsal right with keratotic lesion formation thick and painful the first metatarsal is mildly sore.  Good digital perfusion well-oriented     Assessment:  Painful chronic lesion subfifth metatarsal head right very painful when pressed with lesion first metatarsal mildly tender     Plan:  Chronic tailor's bunion deformity right with probability for inflammation of the first metatarsal head right plantar more difficult condition and hopefully more compensatory from the lateral pain.  Reviewed the condition discussed treatment options I recommended metatarsal head resection explained procedure risk patient wants surgery understanding risk and at this point I allowed patient to go over consent form going over all possible complications associated with surgery and the fact there is no long-term guarantees.  Patient is willing to accept risk and after review signed consent form does understand diabetes plays into that but with her A1c good I think the risk is acceptable and she does also.  All questions answered for her today I did dispense air fracture walker properly fitted to her lower leg as I want her to wear this for a number of weeks after the procedures performed and I want her to get used to it prior to surgery.  Also will discuss with her doctor her history of DVT in her left leg and may have to stop her Eliquis and treat in another way  X-rays indicate there is hypertrophy around  the head of the fifth metatarsal right

## 2022-10-21 ENCOUNTER — Other Ambulatory Visit: Payer: Self-pay | Admitting: Podiatry

## 2022-10-21 DIAGNOSIS — M21621 Bunionette of right foot: Secondary | ICD-10-CM

## 2022-10-21 DIAGNOSIS — L84 Corns and callosities: Secondary | ICD-10-CM

## 2022-10-27 ENCOUNTER — Telehealth: Payer: Self-pay | Admitting: Cardiovascular Disease

## 2022-10-27 ENCOUNTER — Telehealth: Payer: Self-pay

## 2022-10-27 NOTE — Telephone Encounter (Signed)
   Pre-operative Risk Assessment    [name]  DOB: August 05, 1946 MRN: [id number]      Request for Surgical Clearance    Procedure:  Metatarsal Head Resection on right foot  Date of Surgery:  Clearance 11/02/22                                 Surgeon:  Dr. Ila Mcgill  Surgeon's Group or Practice Name:  [facility]  Phone number:  [phone] Fax number:  [phone]   Type of Clearance Requested:   - Pharmacy:  Hold Apixaban (Eliquis)     Type of Anesthesia:   Anesthesiologist choice    Additional requests/questions:  Please advise surgeon/provider what medications should be held.  Signed, April Henson   10/27/2022, 9:22 AM

## 2022-10-27 NOTE — Telephone Encounter (Signed)
   [name]  DOB: 1946/04/17 MRN: [id number]  Primary Cardiologist: Evalina Field, MD  Chart reviewed as part of pre-operative protocol coverage.   Patient is on chronic Eliquis for history of DVT. Eliquis is managed per Vascular Surgery. Therefore, recommendations for holding Eliquis prior to surgery should come from Vascular Surgery.  I will route this recommendation to the requesting provider and remove this request from the preop pool.    Trudi Ida, NP 10/27/2022, 4:53 PM

## 2022-10-27 NOTE — Telephone Encounter (Signed)
DOS 11/02/2022  METATARSAL HEAD RESC 5TH RT - 28113  [facility]  The following codes do not require a pre-authorization  Created on 10/27/2022  Service info [phone] Ostectomy, complete excision; fifth metatarsal head

## 2022-10-28 ENCOUNTER — Telehealth: Payer: Self-pay

## 2022-10-28 NOTE — Telephone Encounter (Signed)
Diane [name] is having surgery with Dr. Paulla Dolly on 11/02/2022. I spoke to her about Eliquis. She stated she has not taken this medication in a month.

## 2022-11-01 MED ORDER — HYDROCODONE-ACETAMINOPHEN 10-325 MG PO TABS: 1.0000 | ORAL_TABLET | Freq: Three times a day (TID) | ORAL | 0 refills | Status: AC | PRN

## 2022-11-01 NOTE — Addendum Note (Signed)
Addended by: Wallene Huh on: 11/01/2022 09:14 PM   Modules accepted: Orders

## 2022-11-02 ENCOUNTER — Encounter: Payer: Self-pay | Admitting: Podiatry

## 2022-11-02 DIAGNOSIS — M21621 Bunionette of right foot: Secondary | ICD-10-CM | POA: Diagnosis not present

## 2022-11-02 DIAGNOSIS — M2011 Hallux valgus (acquired), right foot: Secondary | ICD-10-CM | POA: Diagnosis not present

## 2022-11-05 ENCOUNTER — Telehealth: Payer: Self-pay

## 2022-11-05 NOTE — Telephone Encounter (Signed)
Post-op call completed.

## 2022-11-08 ENCOUNTER — Ambulatory Visit (INDEPENDENT_AMBULATORY_CARE_PROVIDER_SITE_OTHER): Payer: Medicare HMO

## 2022-11-08 ENCOUNTER — Ambulatory Visit (INDEPENDENT_AMBULATORY_CARE_PROVIDER_SITE_OTHER): Payer: Medicare HMO | Admitting: *Deleted

## 2022-11-08 DIAGNOSIS — Z9889 Other specified postprocedural states: Secondary | ICD-10-CM

## 2022-11-08 DIAGNOSIS — M21621 Bunionette of right foot: Secondary | ICD-10-CM

## 2022-11-08 NOTE — Progress Notes (Signed)
Patient presents today for post op visit # 1, patient of [facility].   POV #1 DOS 11/02/2022 5TH METATARSAL HEAD RESCECTION RT    Patient presents in her surgical shoe weightbearing. Denies any falls or injury to the foot. Foot is swollen. No signs of infection. No calf pain or shortness of breath. Bandages dry and intact. Incision is intact. She is taking her pain medication at night. She is in quite a bit of pain.   BP: 158/80 P: 80   Xrays taken today and reviewed by Dr. Paulla Dolly. He did take a look at her foot today as well.   Foot redressed today with acewrap and placed back in the shoe. She can start showering normally per Dr. Mellody Drown instructions. Reviewed icing and elevation. Patient will follow up with Dr. Paulla Dolly in 2 weeks for POV# 2 with xrays and suture removal.

## 2022-11-09 ENCOUNTER — Telehealth: Payer: Self-pay | Admitting: *Deleted

## 2022-11-09 NOTE — Telephone Encounter (Signed)
Patient is calling because she was not able to sleep last night  after taking pain medicine prescribed,this was the first time walking after her surgery,not sure what is going on, has not taken any of the medicine today,please advise.

## 2022-11-10 NOTE — Telephone Encounter (Signed)
Should be able to stop taking the pain medicine by now

## 2022-11-11 NOTE — Telephone Encounter (Signed)
She has stopped the medicine and feels much better.

## 2022-11-22 ENCOUNTER — Ambulatory Visit (INDEPENDENT_AMBULATORY_CARE_PROVIDER_SITE_OTHER): Payer: Medicare HMO

## 2022-11-22 ENCOUNTER — Ambulatory Visit (INDEPENDENT_AMBULATORY_CARE_PROVIDER_SITE_OTHER): Payer: Medicare HMO | Admitting: *Deleted

## 2022-11-22 DIAGNOSIS — Z9889 Other specified postprocedural states: Secondary | ICD-10-CM | POA: Diagnosis not present

## 2022-11-22 DIAGNOSIS — M21621 Bunionette of right foot: Secondary | ICD-10-CM

## 2022-11-22 NOTE — Progress Notes (Signed)
Patient presents today for post op visit # 2, patient of Dr. Paulla Dolly.    POV #2 DOS 11/02/2022 5TH METATARSAL HEAD RESCECTION RT    Sutures removed today. Patient states that her foot burns a lot.  He did review xrays and her foot today   Reviewed icing and elevation. Dispensed compression anklet. Patient will follow up with Dr. Paulla Dolly as needed

## 2022-11-29 DIAGNOSIS — R202 Paresthesia of skin: Secondary | ICD-10-CM | POA: Diagnosis not present

## 2022-11-29 DIAGNOSIS — R200 Anesthesia of skin: Secondary | ICD-10-CM | POA: Diagnosis not present

## 2022-11-29 DIAGNOSIS — G500 Trigeminal neuralgia: Secondary | ICD-10-CM | POA: Diagnosis not present

## 2022-11-29 DIAGNOSIS — H9203 Otalgia, bilateral: Secondary | ICD-10-CM | POA: Diagnosis not present

## 2022-11-29 DIAGNOSIS — G479 Sleep disorder, unspecified: Secondary | ICD-10-CM | POA: Diagnosis not present

## 2022-11-29 DIAGNOSIS — R519 Headache, unspecified: Secondary | ICD-10-CM | POA: Diagnosis not present

## 2022-11-29 DIAGNOSIS — R29818 Other symptoms and signs involving the nervous system: Secondary | ICD-10-CM | POA: Diagnosis not present

## 2022-12-06 ENCOUNTER — Encounter (HOSPITAL_COMMUNITY): Payer: Self-pay | Admitting: *Deleted

## 2022-12-06 ENCOUNTER — Other Ambulatory Visit: Payer: Self-pay

## 2022-12-06 ENCOUNTER — Emergency Department (HOSPITAL_COMMUNITY)
Admission: EM | Admit: 2022-12-06 | Discharge: 2022-12-06 | Disposition: A | Discharge status: Home / Self Care | Payer: Medicare HMO | Attending: Emergency Medicine | Admitting: Emergency Medicine

## 2022-12-06 ENCOUNTER — Emergency Department (HOSPITAL_COMMUNITY): Payer: Medicare HMO

## 2022-12-06 DIAGNOSIS — I10 Essential (primary) hypertension: Secondary | ICD-10-CM | POA: Insufficient documentation

## 2022-12-06 DIAGNOSIS — R1031 Right lower quadrant pain: Secondary | ICD-10-CM | POA: Insufficient documentation

## 2022-12-06 DIAGNOSIS — Z7901 Long term (current) use of anticoagulants: Secondary | ICD-10-CM | POA: Insufficient documentation

## 2022-12-06 DIAGNOSIS — R109 Unspecified abdominal pain: Secondary | ICD-10-CM | POA: Diagnosis present

## 2022-12-06 DIAGNOSIS — E119 Type 2 diabetes mellitus without complications: Secondary | ICD-10-CM | POA: Diagnosis not present

## 2022-12-06 DIAGNOSIS — K760 Fatty (change of) liver, not elsewhere classified: Secondary | ICD-10-CM | POA: Diagnosis not present

## 2022-12-06 DIAGNOSIS — Z7982 Long term (current) use of aspirin: Secondary | ICD-10-CM | POA: Diagnosis not present

## 2022-12-06 DIAGNOSIS — Z7984 Long term (current) use of oral hypoglycemic drugs: Secondary | ICD-10-CM | POA: Insufficient documentation

## 2022-12-06 DIAGNOSIS — R1011 Right upper quadrant pain: Secondary | ICD-10-CM | POA: Insufficient documentation

## 2022-12-06 DIAGNOSIS — Z79899 Other long term (current) drug therapy: Secondary | ICD-10-CM | POA: Insufficient documentation

## 2022-12-06 LAB — CBC WITH DIFFERENTIAL/PLATELET
Abs Immature Granulocytes: 0.01 10*3/uL (ref 0.00–0.07)
Basophils Absolute: 0 10*3/uL (ref 0.0–0.1)
Basophils Relative: 0 %
Eosinophils Absolute: 0 10*3/uL (ref 0.0–0.5)
Eosinophils Relative: 1 %
HCT: 35.3 % — ABNORMAL LOW (ref 36.0–46.0)
Hemoglobin: 10.6 g/dL — ABNORMAL LOW (ref 12.0–15.0)
Immature Granulocytes: 0 %
Lymphocytes Relative: 25 %
Lymphs Abs: 1.5 10*3/uL (ref 0.7–4.0)
MCH: 24.7 pg — ABNORMAL LOW (ref 26.0–34.0)
MCHC: 30 g/dL (ref 30.0–36.0)
MCV: 82.1 fL (ref 80.0–100.0)
Monocytes Absolute: 0.7 10*3/uL (ref 0.1–1.0)
Monocytes Relative: 11 %
Neutro Abs: 3.8 10*3/uL (ref 1.7–7.7)
Neutrophils Relative %: 63 %
Platelets: 243 10*3/uL (ref 150–400)
RBC: 4.3 MIL/uL (ref 3.87–5.11)
RDW: 19.3 % — ABNORMAL HIGH (ref 11.5–15.5)
WBC: 6 10*3/uL (ref 4.0–10.5)
nRBC: 0 % (ref 0.0–0.2)

## 2022-12-06 LAB — URINALYSIS, ROUTINE W REFLEX MICROSCOPIC
Bilirubin Urine: NEGATIVE
Glucose, UA: NEGATIVE mg/dL
Hgb urine dipstick: NEGATIVE
Ketones, ur: NEGATIVE mg/dL
Leukocytes,Ua: NEGATIVE
Nitrite: NEGATIVE
Protein, ur: NEGATIVE mg/dL
Specific Gravity, Urine: 1.008 (ref 1.005–1.030)
pH: 7 (ref 5.0–8.0)

## 2022-12-06 LAB — COMPREHENSIVE METABOLIC PANEL
ALT: 15 U/L (ref 0–44)
AST: 20 U/L (ref 15–41)
Albumin: 3.5 g/dL (ref 3.5–5.0)
Alkaline Phosphatase: 54 U/L (ref 38–126)
Anion gap: 12 (ref 5–15)
BUN: 6 mg/dL — ABNORMAL LOW (ref 8–23)
CO2: 23 mmol/L (ref 22–32)
Calcium: 8.9 mg/dL (ref 8.9–10.3)
Chloride: 105 mmol/L (ref 98–111)
Creatinine, Ser: 0.65 mg/dL (ref 0.44–1.00)
GFR, Estimated: >60 mL/min (ref >60)
Glucose, Bld: 147 mg/dL — ABNORMAL HIGH (ref 70–99)
Potassium: 3.8 mmol/L (ref 3.5–5.1)
Sodium: 140 mmol/L (ref 135–145)
Total Bilirubin: 0.3 mg/dL (ref 0.3–1.2)
Total Protein: 6.9 g/dL (ref 6.5–8.1)

## 2022-12-06 LAB — PROTIME-INR
INR: 1.2 (ref 0.8–1.2)
Prothrombin Time: 15.5 seconds — ABNORMAL HIGH (ref 11.4–15.2)

## 2022-12-06 LAB — LIPASE, BLOOD: Lipase: 49 U/L (ref 11–51)

## 2022-12-06 MED ORDER — FENTANYL CITRATE PF 50 MCG/ML IJ SOSY
50.0000 ug | PREFILLED_SYRINGE | Freq: Once | INTRAMUSCULAR | Status: AC
  Administered 2022-12-06: 50 ug via INTRAVENOUS
  Filled 2022-12-06: qty 1

## 2022-12-06 MED ORDER — IOHEXOL 350 MG/ML SOLN
75.0000 mL | Freq: Once | INTRAVENOUS | Status: AC | PRN
  Administered 2022-12-06: 75 mL via INTRAVENOUS

## 2022-12-06 NOTE — Discharge Instructions (Signed)
Your laboratories also are within normal limits today.  We discussed the results of your CT scan on today's visit.  Please follow-up with your primary care physician as needed.

## 2022-12-06 NOTE — ED Provider Notes (Signed)
[facility] Provider Note   CSN: [id number] Arrival date & time: 12/06/22  2536     History  Chief Complaint  Patient presents with   Abdominal Pain    Diane [name] is a 77 y.o. female.  77 y.o female with a PMH of HTN, DM, Pulmonary edema presents to the ED with a chief complaint of abdominal pain.  Describing as a fullness, sharp "like nails are poking me "sensation to the right upper and lower side of her abdomen.  Does report yesterday she was talking to her aunt laying on her right side, when suddenly the pain caught her, she reports taking 2 gabapentin without much improvement in symptoms.  She also reports the pain returned this morning, prior to arrival to the emergency department she also had to gabapentin which does help with calming down the pain.  She has had multiple admissions to the hospital for bowel obstruction, reports they were unable to do surgical intervention last time as she has "multiple keloids "in her abdomen.  Last bowel movement was yesterday with no blood in her stool.  She is also endorsing some nausea however has not been running any fever.  No vomiting, no suspicious food intake, no chest pain or shortness of breath.  The history is provided by the patient and medical records.  Abdominal Pain Pain location:  RLQ Pain quality: sharp   Pain radiates to:  Does not radiate Pain severity:  Severe Onset quality:  Sudden Duration:  1 day Timing:  Intermittent Progression:  Worsening Chronicity:  New Associated symptoms: nausea   Associated symptoms: no chest pain, no fever, no shortness of breath, no sore throat and no vomiting        Home Medications Prior to Admission medications   Medication Sig Start Date End Date Taking? Authorizing Provider  gabapentin (NEURONTIN) 300 MG capsule Take 300 mg in the morning and 600 mg at night.  Take additional 300 mg as needed for severe pain 11/29/22  Yes [provider]  acetaminophen (TYLENOL) 500 MG tablet Take 1,000 mg by mouth every 6 (six) hours as needed for moderate pain or headache.    [provider]  amLODipine (NORVASC) 5 MG tablet Take 5 mg by mouth daily.    [provider]  apixaban (ELIQUIS) 5 MG TABS tablet Take 5 mg by mouth 2 (two) times daily.    [provider]  aspirin EC 81 MG tablet Take 81 mg by mouth daily. Swallow whole.    [provider]  bethanechol (URECHOLINE) 5 MG tablet Take 1 tablet (5 mg total) by mouth 3 (three) times daily. 12/25/21   Georgette Shell, MD  docusate sodium (COLACE) 100 MG capsule Take 2 capsules (200 mg total) by mouth 3 (three) times daily. 12/25/21   Georgette Shell, MD  DULoxetine (CYMBALTA) 20 MG capsule Take 20 mg by mouth every morning. 04/30/21   [provider]  folic acid (FOLVITE) 1 MG tablet Take 1 tablet (1 mg total) by mouth daily. 02/26/21   Medina-Vargas, Monina C, NP  furosemide (LASIX) 20 MG tablet Take 20 mg by mouth daily. 09/10/21   [provider]  glycerin adult 2 g suppository Place 1 suppository rectally as needed for constipation. 12/25/21   Georgette Shell, MD  linaclotide [facility]) 290 MCG CAPS capsule Take 1 capsule (290 mcg total) by mouth daily before breakfast. 12/25/21   Georgette Shell, MD  metFORMIN (GLUCOPHAGE-XR) 500 MG 24 hr tablet Take 1,000 mg by mouth daily with breakfast.    [provider]  methotrexate (RHEUMATREX) 2.5 MG tablet Take 8 tablets (20 mg total) by mouth once a week. 8 tablets weekly (Sunday) Patient taking differently: Take 20 mg by mouth every Sunday. 8 tabs 02/26/21   Medina-Vargas, Monina C, NP  polyethylene glycol (MIRALAX / GLYCOLAX) 17 g packet Take 17 g by mouth 3 (three) times daily. 12/25/21   Georgette Shell, MD  rosuvastatin (CRESTOR) 10 MG tablet Take 1 tablet (10 mg total) by mouth daily. 02/26/21   Medina-Vargas, Monina C, NP  telmisartan (MICARDIS) 80  MG tablet Take 80 mg by mouth at bedtime. 10/12/21   [provider]      Allergies    Cephalexin, Tapentadol, Codeine, and Nucynta [tapentadol hcl]    Review of Systems   Review of Systems  Constitutional:  Negative for fever.  HENT:  Negative for sore throat.   Respiratory:  Negative for shortness of breath.   Cardiovascular:  Negative for chest pain.  Gastrointestinal:  Positive for abdominal pain and nausea. Negative for vomiting.  Genitourinary:  Negative for flank pain.  All other systems reviewed and are negative.   Physical Exam Updated Vital Signs BP (!) 163/89   Pulse 89   Temp 97.8 F (36.6 C) (Oral)   Resp 16   Ht '5\' 8"'$  (1.727 m)   Wt 95.3 kg   SpO2 95%   BMI 31.93 kg/m  Physical Exam Vitals and nursing note reviewed.  Constitutional:      General: She is not in acute distress.    Appearance: She is well-developed.  HENT:     Head: Normocephalic and atraumatic.     Mouth/Throat:     Pharynx: No oropharyngeal exudate.  Eyes:     Pupils: Pupils are equal, round, and reactive to light.  Cardiovascular:     Rate and Rhythm: Regular rhythm.     Heart sounds: Normal heart sounds.  Pulmonary:     Effort: Pulmonary effort is normal. No respiratory distress.     Breath sounds: Normal breath sounds.  Abdominal:     General: Bowel sounds are decreased. There is no distension.     Palpations: Abdomen is soft.     Tenderness: There is abdominal tenderness in the right upper quadrant and right lower quadrant.  Musculoskeletal:        General: No tenderness or deformity.     Cervical back: Normal range of motion.     Right lower leg: No edema.     Left lower leg: No edema.  Skin:    General: Skin is warm and dry.  Neurological:     Mental Status: She is alert and oriented to person, place, and time.     ED Results / Procedures / Treatments   Labs (all labs ordered are listed, but only abnormal results are displayed) Labs Reviewed  CBC WITH  DIFFERENTIAL/PLATELET - Abnormal; Notable for the following components:      Result Value   Hemoglobin 10.6 (*)    HCT 35.3 (*)    MCH 24.7 (*)    RDW 19.3 (*)    All other components within normal limits  COMPREHENSIVE METABOLIC PANEL - Abnormal; Notable for the following components:   Glucose, Bld 147 (*)    BUN 6 (*)    All other components within normal limits  URINALYSIS, ROUTINE W REFLEX MICROSCOPIC - Abnormal; Notable for  the following components:   Color, Urine STRAW (*)    All other components within normal limits  PROTIME-INR - Abnormal; Notable for the following components:   Prothrombin Time 15.5 (*)    All other components within normal limits  LIPASE, BLOOD    EKG None  Radiology CT ABDOMEN PELVIS W CONTRAST  Result Date: 12/06/2022 CLINICAL DATA:  Bowel obstruction suspected. EXAM: CT ABDOMEN AND PELVIS WITH CONTRAST TECHNIQUE: Multidetector CT imaging of the abdomen and pelvis was performed using the standard protocol following bolus administration of intravenous contrast. RADIATION DOSE REDUCTION: This exam was performed according to the departmental dose-optimization program which includes automated exposure control, adjustment of the mA and/or kV according to patient size and/or use of iterative reconstruction technique. CONTRAST:  43m OMNIPAQUE IOHEXOL 350 MG/ML SOLN COMPARISON:  CT abdomen pelvis dated December 22, 2021. FINDINGS: Lower chest: No acute abnormality. Hepatobiliary: Diffusely decreased liver density without focal abnormality. Status post cholecystectomy. No biliary dilatation. Pancreas: Unremarkable. No pancreatic ductal dilatation or surrounding inflammatory changes. Spleen: Normal in size without focal abnormality. Adrenals/Urinary Tract: Adrenal glands are unremarkable. Kidneys are normal, without renal calculi, focal lesion, or hydronephrosis. Bladder is unremarkable. Stomach/Bowel: Unchanged small hiatal hernia. The stomach is otherwise within normal  limits. No bowel wall thickening, distention, or surrounding inflammatory changes. Moderate stool burden throughout the colon. Normal appendix. Vascular/Lymphatic: Aortic atherosclerosis. Prior stenting of the left common iliac vein with new peripheral eccentric low-density along the wall of the stent, suspicious for nonocclusive thrombus. No enlarged abdominal or pelvic lymph nodes. Reproductive: Status post hysterectomy. No adnexal masses. Other: No free fluid or pneumoperitoneum. Prior umbilical and right inguinal hernia repairs. Musculoskeletal: No acute or significant osseous findings. IMPRESSION: 1. No acute intra-abdominal process.  No bowel obstruction. 2. Prior stenting of the left common iliac vein with new intraluminal nonocclusive thrombus. 3. Hepatic steatosis. 4.  Aortic Atherosclerosis (ICD10-I70.0). Electronically Signed   By: WTitus DubinM.D.   On: 12/06/2022 12:02    Procedures Procedures    Medications Ordered in ED Medications  iohexol (OMNIPAQUE) 350 MG/ML injection 75 mL (75 mLs Intravenous Contrast Given 12/06/22 1149)  fentaNYL (SUBLIMAZE) injection 50 mcg (50 mcg Intravenous Given 12/06/22 1308)    ED Course/ Medical Decision Making/ A&P                             Medical Decision Making Amount and/or Complexity of Data Reviewed Labs: ordered. Radiology: ordered.  Risk Prescription drug management.   This patient presents to the ED for concern of right sided abdominal pain, this involves a number of treatment options, and is a complaint that carries with it a high risk of complications and morbidity.  The differential diagnosis includes  reflux, obstruction versus viral.   Co morbidities: Discussed in HPI   Brief History:  Patient here with right-sided abdominal pain that began last night, taking gabapentin for it without much improvement in symptoms.  Prior history of bowel obstruction.  Did have cholecystectomy, has had multiple visits due to GI symptoms.   Reports the pain is alleviated with not laying on that side and rest.  No fever, no suspicious food intake.  EMR reviewed including pt PMHx, past surgical history and past visits to ER.   See HPI for more details   Lab Tests:  I ordered and independently interpreted labs.  The pertinent results include:    I personally reviewed all laboratory work and imaging. Metabolic  panel without any acute abnormality specifically kidney function within normal limits and no significant electrolyte abnormalities. CBC without leukocytosis or significant anemia.   Imaging Studies:  NAD. I personally reviewed all imaging studies and no acute abnormality found. I agree with radiology interpretation.  Medicines ordered:  I ordered medication including fentanyl  for pain control Reevaluation of the patient after these medicines showed that the patient resolved I have reviewed the patients home medicines and have made adjustments as needed  Reevaluation:  After the interventions noted above I re-evaluated patient and found that they have :resolved   Social Determinants of Health:  The patient's social determinants of health were a factor in the care of this patient  Problem List / ED Course:  Patient presents to the ED with right-sided abdominal pain which began yesterday, has taken gabapentin for the symptoms without any improvement.  No nausea, no vomiting.  Did have a prior cholecystectomy therefore I doubt gallbladder pathology at this time.  Pain is nonreproducible with palpation.  She does have prior history of bowel obstruction therefore she is concerned that this may be a new bowel obstruction.  On evaluation she is overall nontoxic-appearing, vitals are within normal limits.  Interpretation of her labs by me reveal no abnormality, bowel sounds are slightly diminished however she did have a bowel movement yesterday without any blood in her stool.  UA with no nitrate, leukocyte noted.  CT  abdomen and pelvis obtained which did not show any acute findings, no concerns for obstruction at this time.  She was given fentanyl for pain control with resolution in her symptoms.  Do not suspect obstruction at this time as patient with a negative CT, normal bowel movement yesterday and stable vital signs.  I discussed with her appropriate follow-up with primary care physician.  Doubt gallbladder pathology stem as she had a prior cholecystectomy.  She does have a paramedical hernia, I do not suspect that this is causing her symptoms as is not incarcerated or strangulated.  Patient is agreeable to follow-up with PCP.  Stable for discharge.  Dispostion:  After consideration of the diagnostic results and the patients response to treatment, I feel that the patent would benefit from follow up with primary care physician.    Portions of this note were generated with [employer]. Dictation errors may occur despite best attempts at proofreading.   Final Clinical Impression(s) / ED Diagnoses Final diagnoses:  Right upper quadrant abdominal pain    Rx / DC Orders ED Discharge Orders     None         Janeece Fitting, PA-C 12/06/22 1358    Godfrey Pick, MD 12/08/22 [phone]

## 2022-12-06 NOTE — ED Triage Notes (Signed)
Patient states she hasn't been able to sleep on her right side for several weeks , states her right side is sore and it goes into her back states she has knot on her side. She took 2 gabapin  last pm and 2 this am of which has eased the pain some. Denies n/v

## 2022-12-14 ENCOUNTER — Telehealth: Payer: Self-pay

## 2022-12-14 NOTE — Telephone Encounter (Signed)
        Patient  visited [facility] on 2/5  Telephone encounter attempt :  1st  A HIPAA compliant voice message was left requesting a return call.  Instructed patient to call back     [facility] [phone] [address], [place], [place] [address] Phone: [phone] Email: [email or web address]

## 2022-12-15 ENCOUNTER — Telehealth: Payer: Self-pay

## 2022-12-15 DIAGNOSIS — F419 Anxiety disorder, unspecified: Secondary | ICD-10-CM | POA: Diagnosis not present

## 2022-12-15 DIAGNOSIS — E782 Mixed hyperlipidemia: Secondary | ICD-10-CM | POA: Diagnosis not present

## 2022-12-15 DIAGNOSIS — K560 Paralytic ileus: Secondary | ICD-10-CM | POA: Diagnosis not present

## 2022-12-15 DIAGNOSIS — R109 Unspecified abdominal pain: Secondary | ICD-10-CM | POA: Diagnosis not present

## 2022-12-15 DIAGNOSIS — I10 Essential (primary) hypertension: Secondary | ICD-10-CM | POA: Diagnosis not present

## 2022-12-15 DIAGNOSIS — K760 Fatty (change of) liver, not elsewhere classified: Secondary | ICD-10-CM | POA: Diagnosis not present

## 2022-12-15 DIAGNOSIS — G4700 Insomnia, unspecified: Secondary | ICD-10-CM | POA: Diagnosis not present

## 2022-12-15 DIAGNOSIS — E1165 Type 2 diabetes mellitus with hyperglycemia: Secondary | ICD-10-CM | POA: Diagnosis not present

## 2022-12-15 NOTE — Telephone Encounter (Signed)
        Patient  visited [facility] on 2/5    Telephone encounter attempt :  2nd  A HIPAA compliant voice message was left requesting a return call.  Instructed patient to call back .    [facility] [phone] [address], [place], [place] [address] Phone: [phone] Email: [email or web address]

## 2022-12-30 DIAGNOSIS — I10 Essential (primary) hypertension: Secondary | ICD-10-CM | POA: Diagnosis not present

## 2022-12-30 DIAGNOSIS — N3281 Overactive bladder: Secondary | ICD-10-CM | POA: Diagnosis not present

## 2022-12-30 DIAGNOSIS — K5904 Chronic idiopathic constipation: Secondary | ICD-10-CM | POA: Diagnosis not present

## 2023-01-03 ENCOUNTER — Ambulatory Visit (INDEPENDENT_AMBULATORY_CARE_PROVIDER_SITE_OTHER): Payer: Medicare HMO

## 2023-01-03 ENCOUNTER — Ambulatory Visit (INDEPENDENT_AMBULATORY_CARE_PROVIDER_SITE_OTHER): Payer: Medicare HMO | Admitting: Podiatry

## 2023-01-03 VITALS — BP 150/78 | HR 89

## 2023-01-03 DIAGNOSIS — D689 Coagulation defect, unspecified: Secondary | ICD-10-CM | POA: Diagnosis not present

## 2023-01-03 DIAGNOSIS — Z9889 Other specified postprocedural states: Secondary | ICD-10-CM

## 2023-01-03 DIAGNOSIS — M79671 Pain in right foot: Secondary | ICD-10-CM | POA: Diagnosis not present

## 2023-01-03 DIAGNOSIS — L84 Corns and callosities: Secondary | ICD-10-CM | POA: Diagnosis not present

## 2023-01-03 NOTE — Progress Notes (Signed)
Subjective:   Patient ID: Diane [name], female   DOB: 77 y.o.   MRN: [id number]   HPI Patient presents doing well with surgery but does have 2 chronic lesions right plantar foot that are sore and she is on blood thinner and cannot take care of them.   ROS      Objective:  Physical Exam  Neurovascular status intact muscle strength adequate with the right incision site healing well and lesion formation with thick keratotic tissue subone subfive right first metatarsal     Assessment:  Doing well post fifth metatarsal head resection with lesion formation x 2 right with risk factors     Plan:  H&P reviewed x-ray and using sterile instrumentation debrided both areas no iatrogenic bleeding and patient will be seen back for repeat check and is encouraged to call questions concerns  X-rays indicate satisfactory resection head of fifth metatarsal right with no pathology reappoint as needed

## 2023-01-13 DIAGNOSIS — D509 Iron deficiency anemia, unspecified: Secondary | ICD-10-CM | POA: Diagnosis not present

## 2023-01-13 DIAGNOSIS — Z79899 Other long term (current) drug therapy: Secondary | ICD-10-CM | POA: Diagnosis not present

## 2023-01-13 DIAGNOSIS — M79643 Pain in unspecified hand: Secondary | ICD-10-CM | POA: Diagnosis not present

## 2023-01-13 DIAGNOSIS — M1990 Unspecified osteoarthritis, unspecified site: Secondary | ICD-10-CM | POA: Diagnosis not present

## 2023-01-13 DIAGNOSIS — R252 Cramp and spasm: Secondary | ICD-10-CM | POA: Diagnosis not present

## 2023-01-13 DIAGNOSIS — M349 Systemic sclerosis, unspecified: Secondary | ICD-10-CM | POA: Diagnosis not present

## 2023-01-13 DIAGNOSIS — K760 Fatty (change of) liver, not elsewhere classified: Secondary | ICD-10-CM | POA: Diagnosis not present

## 2023-01-13 DIAGNOSIS — M0579 Rheumatoid arthritis with rheumatoid factor of multiple sites without organ or systems involvement: Secondary | ICD-10-CM | POA: Diagnosis not present

## 2023-01-13 DIAGNOSIS — M7989 Other specified soft tissue disorders: Secondary | ICD-10-CM | POA: Diagnosis not present

## 2023-01-18 DIAGNOSIS — D649 Anemia, unspecified: Secondary | ICD-10-CM | POA: Diagnosis not present

## 2023-01-21 DIAGNOSIS — R3915 Urgency of urination: Secondary | ICD-10-CM | POA: Diagnosis not present

## 2023-01-21 DIAGNOSIS — R6889 Other general symptoms and signs: Secondary | ICD-10-CM | POA: Diagnosis not present

## 2023-01-21 DIAGNOSIS — N3941 Urge incontinence: Secondary | ICD-10-CM | POA: Diagnosis not present

## 2023-01-26 DIAGNOSIS — R519 Headache, unspecified: Secondary | ICD-10-CM | POA: Diagnosis not present

## 2023-01-26 DIAGNOSIS — R200 Anesthesia of skin: Secondary | ICD-10-CM | POA: Diagnosis not present

## 2023-01-26 DIAGNOSIS — G479 Sleep disorder, unspecified: Secondary | ICD-10-CM | POA: Diagnosis not present

## 2023-01-26 DIAGNOSIS — G500 Trigeminal neuralgia: Secondary | ICD-10-CM | POA: Diagnosis not present

## 2023-01-26 DIAGNOSIS — R202 Paresthesia of skin: Secondary | ICD-10-CM | POA: Diagnosis not present

## 2023-01-26 DIAGNOSIS — H9203 Otalgia, bilateral: Secondary | ICD-10-CM | POA: Diagnosis not present

## 2023-01-30 DIAGNOSIS — K5904 Chronic idiopathic constipation: Secondary | ICD-10-CM | POA: Diagnosis not present

## 2023-01-30 DIAGNOSIS — N3281 Overactive bladder: Secondary | ICD-10-CM | POA: Diagnosis not present

## 2023-01-30 DIAGNOSIS — I10 Essential (primary) hypertension: Secondary | ICD-10-CM | POA: Diagnosis not present

## 2023-02-07 DIAGNOSIS — E1165 Type 2 diabetes mellitus with hyperglycemia: Secondary | ICD-10-CM | POA: Diagnosis not present

## 2023-02-07 DIAGNOSIS — E782 Mixed hyperlipidemia: Secondary | ICD-10-CM | POA: Diagnosis not present

## 2023-02-07 DIAGNOSIS — D6869 Other thrombophilia: Secondary | ICD-10-CM | POA: Diagnosis not present

## 2023-02-07 DIAGNOSIS — I10 Essential (primary) hypertension: Secondary | ICD-10-CM | POA: Diagnosis not present

## 2023-02-07 DIAGNOSIS — I700 Atherosclerosis of aorta: Secondary | ICD-10-CM | POA: Diagnosis not present

## 2023-02-14 DIAGNOSIS — M349 Systemic sclerosis, unspecified: Secondary | ICD-10-CM | POA: Diagnosis not present

## 2023-02-14 DIAGNOSIS — E782 Mixed hyperlipidemia: Secondary | ICD-10-CM | POA: Diagnosis not present

## 2023-02-14 DIAGNOSIS — I82402 Acute embolism and thrombosis of unspecified deep veins of left lower extremity: Secondary | ICD-10-CM | POA: Diagnosis not present

## 2023-02-14 DIAGNOSIS — D692 Other nonthrombocytopenic purpura: Secondary | ICD-10-CM | POA: Diagnosis not present

## 2023-02-14 DIAGNOSIS — M341 CR(E)ST syndrome: Secondary | ICD-10-CM | POA: Diagnosis not present

## 2023-02-14 DIAGNOSIS — I700 Atherosclerosis of aorta: Secondary | ICD-10-CM | POA: Diagnosis not present

## 2023-02-14 DIAGNOSIS — M069 Rheumatoid arthritis, unspecified: Secondary | ICD-10-CM | POA: Diagnosis not present

## 2023-02-14 DIAGNOSIS — E1165 Type 2 diabetes mellitus with hyperglycemia: Secondary | ICD-10-CM | POA: Diagnosis not present

## 2023-02-14 DIAGNOSIS — D6869 Other thrombophilia: Secondary | ICD-10-CM | POA: Diagnosis not present

## 2023-03-01 DIAGNOSIS — N3281 Overactive bladder: Secondary | ICD-10-CM | POA: Diagnosis not present

## 2023-03-01 DIAGNOSIS — R6889 Other general symptoms and signs: Secondary | ICD-10-CM | POA: Diagnosis not present

## 2023-03-01 DIAGNOSIS — R1011 Right upper quadrant pain: Secondary | ICD-10-CM | POA: Diagnosis not present

## 2023-03-01 DIAGNOSIS — K5904 Chronic idiopathic constipation: Secondary | ICD-10-CM | POA: Diagnosis not present

## 2023-03-01 DIAGNOSIS — I10 Essential (primary) hypertension: Secondary | ICD-10-CM | POA: Diagnosis not present

## 2023-03-01 DIAGNOSIS — R109 Unspecified abdominal pain: Secondary | ICD-10-CM | POA: Diagnosis not present

## 2023-03-01 DIAGNOSIS — I700 Atherosclerosis of aorta: Secondary | ICD-10-CM | POA: Diagnosis not present

## 2023-03-15 DIAGNOSIS — B028 Zoster with other complications: Secondary | ICD-10-CM | POA: Diagnosis not present

## 2023-03-30 DIAGNOSIS — M6289 Other specified disorders of muscle: Secondary | ICD-10-CM | POA: Diagnosis not present

## 2023-03-30 DIAGNOSIS — M62838 Other muscle spasm: Secondary | ICD-10-CM | POA: Diagnosis not present

## 2023-03-30 DIAGNOSIS — R159 Full incontinence of feces: Secondary | ICD-10-CM | POA: Diagnosis not present

## 2023-03-30 DIAGNOSIS — R1011 Right upper quadrant pain: Secondary | ICD-10-CM | POA: Diagnosis not present

## 2023-03-30 DIAGNOSIS — N3941 Urge incontinence: Secondary | ICD-10-CM | POA: Diagnosis not present

## 2023-03-30 DIAGNOSIS — M6281 Muscle weakness (generalized): Secondary | ICD-10-CM | POA: Diagnosis not present

## 2023-04-11 DIAGNOSIS — D509 Iron deficiency anemia, unspecified: Secondary | ICD-10-CM | POA: Diagnosis not present

## 2023-04-11 DIAGNOSIS — K760 Fatty (change of) liver, not elsewhere classified: Secondary | ICD-10-CM | POA: Diagnosis not present

## 2023-04-11 DIAGNOSIS — Z79899 Other long term (current) drug therapy: Secondary | ICD-10-CM | POA: Diagnosis not present

## 2023-04-11 DIAGNOSIS — M0579 Rheumatoid arthritis with rheumatoid factor of multiple sites without organ or systems involvement: Secondary | ICD-10-CM | POA: Diagnosis not present

## 2023-04-11 DIAGNOSIS — M1990 Unspecified osteoarthritis, unspecified site: Secondary | ICD-10-CM | POA: Diagnosis not present

## 2023-04-11 DIAGNOSIS — M79643 Pain in unspecified hand: Secondary | ICD-10-CM | POA: Diagnosis not present

## 2023-04-11 DIAGNOSIS — M7989 Other specified soft tissue disorders: Secondary | ICD-10-CM | POA: Diagnosis not present

## 2023-04-11 DIAGNOSIS — M349 Systemic sclerosis, unspecified: Secondary | ICD-10-CM | POA: Diagnosis not present

## 2023-04-11 DIAGNOSIS — R252 Cramp and spasm: Secondary | ICD-10-CM | POA: Diagnosis not present

## 2023-04-12 DIAGNOSIS — R21 Rash and other nonspecific skin eruption: Secondary | ICD-10-CM | POA: Diagnosis not present

## 2023-04-14 DIAGNOSIS — N3941 Urge incontinence: Secondary | ICD-10-CM | POA: Diagnosis not present

## 2023-04-14 DIAGNOSIS — M6289 Other specified disorders of muscle: Secondary | ICD-10-CM | POA: Diagnosis not present

## 2023-04-14 DIAGNOSIS — M6281 Muscle weakness (generalized): Secondary | ICD-10-CM | POA: Diagnosis not present

## 2023-04-14 DIAGNOSIS — M62838 Other muscle spasm: Secondary | ICD-10-CM | POA: Diagnosis not present

## 2023-04-20 DIAGNOSIS — M349 Systemic sclerosis, unspecified: Secondary | ICD-10-CM | POA: Diagnosis not present

## 2023-04-20 DIAGNOSIS — L0292 Furuncle, unspecified: Secondary | ICD-10-CM | POA: Diagnosis not present

## 2023-05-16 DIAGNOSIS — R413 Other amnesia: Secondary | ICD-10-CM | POA: Diagnosis not present

## 2023-05-16 DIAGNOSIS — R2689 Other abnormalities of gait and mobility: Secondary | ICD-10-CM | POA: Diagnosis not present

## 2023-05-16 DIAGNOSIS — R202 Paresthesia of skin: Secondary | ICD-10-CM | POA: Diagnosis not present

## 2023-05-16 DIAGNOSIS — G500 Trigeminal neuralgia: Secondary | ICD-10-CM | POA: Diagnosis not present

## 2023-05-16 DIAGNOSIS — R42 Dizziness and giddiness: Secondary | ICD-10-CM | POA: Diagnosis not present

## 2023-05-16 DIAGNOSIS — G479 Sleep disorder, unspecified: Secondary | ICD-10-CM | POA: Diagnosis not present

## 2023-05-16 DIAGNOSIS — N3941 Urge incontinence: Secondary | ICD-10-CM | POA: Diagnosis not present

## 2023-05-16 DIAGNOSIS — R200 Anesthesia of skin: Secondary | ICD-10-CM | POA: Diagnosis not present

## 2023-05-16 DIAGNOSIS — R519 Headache, unspecified: Secondary | ICD-10-CM | POA: Diagnosis not present

## 2023-05-17 DIAGNOSIS — L80 Vitiligo: Secondary | ICD-10-CM | POA: Diagnosis not present

## 2023-05-17 DIAGNOSIS — R21 Rash and other nonspecific skin eruption: Secondary | ICD-10-CM | POA: Diagnosis not present

## 2023-05-17 DIAGNOSIS — L299 Pruritus, unspecified: Secondary | ICD-10-CM | POA: Diagnosis not present

## 2023-05-20 ENCOUNTER — Other Ambulatory Visit: Payer: Self-pay | Admitting: Physician Assistant

## 2023-05-20 DIAGNOSIS — R413 Other amnesia: Secondary | ICD-10-CM

## 2023-05-20 DIAGNOSIS — G912 (Idiopathic) normal pressure hydrocephalus: Secondary | ICD-10-CM

## 2023-05-20 DIAGNOSIS — R29818 Other symptoms and signs involving the nervous system: Secondary | ICD-10-CM

## 2023-05-20 DIAGNOSIS — R42 Dizziness and giddiness: Secondary | ICD-10-CM

## 2023-05-24 ENCOUNTER — Other Ambulatory Visit: Payer: Self-pay | Admitting: Internal Medicine

## 2023-05-24 DIAGNOSIS — Z0000 Encounter for general adult medical examination without abnormal findings: Secondary | ICD-10-CM

## 2023-05-26 DIAGNOSIS — M62838 Other muscle spasm: Secondary | ICD-10-CM | POA: Diagnosis not present

## 2023-05-26 DIAGNOSIS — M6289 Other specified disorders of muscle: Secondary | ICD-10-CM | POA: Diagnosis not present

## 2023-05-26 DIAGNOSIS — K5900 Constipation, unspecified: Secondary | ICD-10-CM | POA: Diagnosis not present

## 2023-05-26 DIAGNOSIS — N3941 Urge incontinence: Secondary | ICD-10-CM | POA: Diagnosis not present

## 2023-05-26 DIAGNOSIS — M6281 Muscle weakness (generalized): Secondary | ICD-10-CM | POA: Diagnosis not present

## 2023-06-01 ENCOUNTER — Encounter: Payer: Self-pay | Admitting: Physician Assistant

## 2023-06-03 ENCOUNTER — Ambulatory Visit
Admission: RE | Admit: 2023-06-03 | Discharge: 2023-06-03 | Disposition: A | Discharge status: Home / Self Care | Payer: Medicare HMO | Source: Ambulatory Visit | Attending: Physician Assistant | Admitting: Physician Assistant

## 2023-06-03 DIAGNOSIS — R413 Other amnesia: Secondary | ICD-10-CM

## 2023-06-03 DIAGNOSIS — R42 Dizziness and giddiness: Secondary | ICD-10-CM | POA: Diagnosis not present

## 2023-06-03 DIAGNOSIS — R29818 Other symptoms and signs involving the nervous system: Secondary | ICD-10-CM

## 2023-06-03 DIAGNOSIS — G912 (Idiopathic) normal pressure hydrocephalus: Secondary | ICD-10-CM

## 2023-06-09 ENCOUNTER — Ambulatory Visit: Payer: Medicare HMO

## 2023-06-14 DIAGNOSIS — R310 Gross hematuria: Secondary | ICD-10-CM | POA: Diagnosis not present

## 2023-06-14 DIAGNOSIS — N390 Urinary tract infection, site not specified: Secondary | ICD-10-CM | POA: Diagnosis not present

## 2023-06-14 DIAGNOSIS — R3915 Urgency of urination: Secondary | ICD-10-CM | POA: Diagnosis not present

## 2023-06-14 DIAGNOSIS — R3982 Chronic bladder pain: Secondary | ICD-10-CM | POA: Diagnosis not present

## 2023-06-14 DIAGNOSIS — C669 Malignant neoplasm of unspecified ureter: Secondary | ICD-10-CM | POA: Diagnosis not present

## 2023-06-17 ENCOUNTER — Ambulatory Visit: Payer: Medicare HMO

## 2023-06-21 DIAGNOSIS — M62838 Other muscle spasm: Secondary | ICD-10-CM | POA: Diagnosis not present

## 2023-06-21 DIAGNOSIS — M6281 Muscle weakness (generalized): Secondary | ICD-10-CM | POA: Diagnosis not present

## 2023-06-21 DIAGNOSIS — N3941 Urge incontinence: Secondary | ICD-10-CM | POA: Diagnosis not present

## 2023-06-21 DIAGNOSIS — R6889 Other general symptoms and signs: Secondary | ICD-10-CM | POA: Diagnosis not present

## 2023-06-21 DIAGNOSIS — R202 Paresthesia of skin: Secondary | ICD-10-CM | POA: Diagnosis not present

## 2023-06-21 DIAGNOSIS — R200 Anesthesia of skin: Secondary | ICD-10-CM | POA: Diagnosis not present

## 2023-06-21 DIAGNOSIS — M6289 Other specified disorders of muscle: Secondary | ICD-10-CM | POA: Diagnosis not present

## 2023-06-21 DIAGNOSIS — R3982 Chronic bladder pain: Secondary | ICD-10-CM | POA: Diagnosis not present

## 2023-06-24 ENCOUNTER — Ambulatory Visit (HOSPITAL_COMMUNITY)
Admission: EM | Admit: 2023-06-24 | Discharge: 2023-06-24 | Disposition: A | Discharge status: Home / Self Care | Payer: Medicare HMO | Attending: Emergency Medicine | Admitting: Emergency Medicine

## 2023-06-24 ENCOUNTER — Encounter (HOSPITAL_COMMUNITY): Payer: Self-pay

## 2023-06-24 DIAGNOSIS — Z1152 Encounter for screening for COVID-19: Secondary | ICD-10-CM | POA: Diagnosis not present

## 2023-06-24 DIAGNOSIS — R051 Acute cough: Secondary | ICD-10-CM

## 2023-06-24 MED ORDER — BENZONATATE 100 MG PO CAPS: 100.0000 mg | ORAL_CAPSULE | Freq: Three times a day (TID) | ORAL | 0 refills | Status: DC | PRN

## 2023-06-24 NOTE — ED Triage Notes (Signed)
Pt c/o cough since last night and unable to get any rest. States her chest hurts when she is coughing. States took allergy meds last night with little relief.

## 2023-06-24 NOTE — Discharge Instructions (Addendum)
We will call you if your covid test returns positive.   Try the cough pills (Tessalon) three times daily (every 8 hours) If this medication makes you drowsy, take only before bedtime  Also try honey and cough drops Drink lots of fluids!
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
## 2023-06-25 LAB — SARS CORONAVIRUS 2 (TAT 6-24 HRS): SARS Coronavirus 2: NEGATIVE

## 2023-06-27 DIAGNOSIS — R3915 Urgency of urination: Secondary | ICD-10-CM | POA: Diagnosis not present

## 2023-06-27 DIAGNOSIS — N393 Stress incontinence (female) (male): Secondary | ICD-10-CM | POA: Diagnosis not present

## 2023-06-27 DIAGNOSIS — R6889 Other general symptoms and signs: Secondary | ICD-10-CM | POA: Diagnosis not present

## 2023-06-29 ENCOUNTER — Ambulatory Visit: Payer: Medicare HMO

## 2023-07-06 ENCOUNTER — Ambulatory Visit
Admission: RE | Admit: 2023-07-06 | Discharge: 2023-07-06 | Disposition: A | Discharge status: Home / Self Care | Payer: Medicare HMO | Source: Ambulatory Visit | Attending: Internal Medicine | Admitting: Internal Medicine

## 2023-07-06 DIAGNOSIS — Z0000 Encounter for general adult medical examination without abnormal findings: Secondary | ICD-10-CM

## 2023-07-06 DIAGNOSIS — Z1231 Encounter for screening mammogram for malignant neoplasm of breast: Secondary | ICD-10-CM | POA: Diagnosis not present

## 2023-07-12 DIAGNOSIS — Z79899 Other long term (current) drug therapy: Secondary | ICD-10-CM | POA: Diagnosis not present

## 2023-07-12 DIAGNOSIS — D509 Iron deficiency anemia, unspecified: Secondary | ICD-10-CM | POA: Diagnosis not present

## 2023-07-12 DIAGNOSIS — R252 Cramp and spasm: Secondary | ICD-10-CM | POA: Diagnosis not present

## 2023-07-12 DIAGNOSIS — K760 Fatty (change of) liver, not elsewhere classified: Secondary | ICD-10-CM | POA: Diagnosis not present

## 2023-07-12 DIAGNOSIS — M7989 Other specified soft tissue disorders: Secondary | ICD-10-CM | POA: Diagnosis not present

## 2023-07-12 DIAGNOSIS — M1990 Unspecified osteoarthritis, unspecified site: Secondary | ICD-10-CM | POA: Diagnosis not present

## 2023-07-12 DIAGNOSIS — M79643 Pain in unspecified hand: Secondary | ICD-10-CM | POA: Diagnosis not present

## 2023-07-12 DIAGNOSIS — M0579 Rheumatoid arthritis with rheumatoid factor of multiple sites without organ or systems involvement: Secondary | ICD-10-CM | POA: Diagnosis not present

## 2023-07-12 DIAGNOSIS — M349 Systemic sclerosis, unspecified: Secondary | ICD-10-CM | POA: Diagnosis not present

## 2023-07-13 DIAGNOSIS — Z23 Encounter for immunization: Secondary | ICD-10-CM | POA: Diagnosis not present

## 2023-07-13 DIAGNOSIS — R059 Cough, unspecified: Secondary | ICD-10-CM | POA: Diagnosis not present

## 2023-07-18 DIAGNOSIS — R2689 Other abnormalities of gait and mobility: Secondary | ICD-10-CM | POA: Diagnosis not present

## 2023-07-18 DIAGNOSIS — R42 Dizziness and giddiness: Secondary | ICD-10-CM | POA: Diagnosis not present

## 2023-07-18 DIAGNOSIS — H9203 Otalgia, bilateral: Secondary | ICD-10-CM | POA: Diagnosis not present

## 2023-07-18 DIAGNOSIS — R413 Other amnesia: Secondary | ICD-10-CM | POA: Diagnosis not present

## 2023-07-18 DIAGNOSIS — G479 Sleep disorder, unspecified: Secondary | ICD-10-CM | POA: Diagnosis not present

## 2023-07-18 DIAGNOSIS — R519 Headache, unspecified: Secondary | ICD-10-CM | POA: Diagnosis not present

## 2023-07-20 ENCOUNTER — Encounter (HOSPITAL_COMMUNITY): Payer: Self-pay | Admitting: Rheumatology

## 2023-07-27 DIAGNOSIS — R2689 Other abnormalities of gait and mobility: Secondary | ICD-10-CM | POA: Diagnosis not present

## 2023-07-27 DIAGNOSIS — R42 Dizziness and giddiness: Secondary | ICD-10-CM | POA: Diagnosis not present

## 2023-07-27 DIAGNOSIS — R413 Other amnesia: Secondary | ICD-10-CM | POA: Diagnosis not present

## 2023-07-27 DIAGNOSIS — R519 Headache, unspecified: Secondary | ICD-10-CM | POA: Diagnosis not present

## 2023-08-09 DIAGNOSIS — K760 Fatty (change of) liver, not elsewhere classified: Secondary | ICD-10-CM | POA: Diagnosis not present

## 2023-08-09 DIAGNOSIS — E1165 Type 2 diabetes mellitus with hyperglycemia: Secondary | ICD-10-CM | POA: Diagnosis not present

## 2023-08-09 DIAGNOSIS — E782 Mixed hyperlipidemia: Secondary | ICD-10-CM | POA: Diagnosis not present

## 2023-08-09 DIAGNOSIS — D6869 Other thrombophilia: Secondary | ICD-10-CM | POA: Diagnosis not present

## 2023-08-09 DIAGNOSIS — I700 Atherosclerosis of aorta: Secondary | ICD-10-CM | POA: Diagnosis not present

## 2023-08-09 DIAGNOSIS — M341 CR(E)ST syndrome: Secondary | ICD-10-CM | POA: Diagnosis not present

## 2023-08-09 DIAGNOSIS — Z7901 Long term (current) use of anticoagulants: Secondary | ICD-10-CM | POA: Diagnosis not present

## 2023-08-09 DIAGNOSIS — I10 Essential (primary) hypertension: Secondary | ICD-10-CM | POA: Diagnosis not present

## 2023-08-10 DIAGNOSIS — I700 Atherosclerosis of aorta: Secondary | ICD-10-CM | POA: Diagnosis not present

## 2023-08-10 DIAGNOSIS — E1165 Type 2 diabetes mellitus with hyperglycemia: Secondary | ICD-10-CM | POA: Diagnosis not present

## 2023-08-10 DIAGNOSIS — D692 Other nonthrombocytopenic purpura: Secondary | ICD-10-CM | POA: Diagnosis not present

## 2023-08-10 DIAGNOSIS — M349 Systemic sclerosis, unspecified: Secondary | ICD-10-CM | POA: Diagnosis not present

## 2023-08-10 DIAGNOSIS — M341 CR(E)ST syndrome: Secondary | ICD-10-CM | POA: Diagnosis not present

## 2023-08-10 DIAGNOSIS — M069 Rheumatoid arthritis, unspecified: Secondary | ICD-10-CM | POA: Diagnosis not present

## 2023-08-10 DIAGNOSIS — Z23 Encounter for immunization: Secondary | ICD-10-CM | POA: Diagnosis not present

## 2023-08-10 DIAGNOSIS — K560 Paralytic ileus: Secondary | ICD-10-CM | POA: Diagnosis not present

## 2023-08-10 DIAGNOSIS — D6869 Other thrombophilia: Secondary | ICD-10-CM | POA: Diagnosis not present

## 2023-08-16 DIAGNOSIS — M62838 Other muscle spasm: Secondary | ICD-10-CM | POA: Diagnosis not present

## 2023-08-16 DIAGNOSIS — M6289 Other specified disorders of muscle: Secondary | ICD-10-CM | POA: Diagnosis not present

## 2023-08-16 DIAGNOSIS — R159 Full incontinence of feces: Secondary | ICD-10-CM | POA: Diagnosis not present

## 2023-08-16 DIAGNOSIS — N3941 Urge incontinence: Secondary | ICD-10-CM | POA: Diagnosis not present

## 2023-08-16 DIAGNOSIS — M6281 Muscle weakness (generalized): Secondary | ICD-10-CM | POA: Diagnosis not present

## 2023-08-25 ENCOUNTER — Other Ambulatory Visit: Payer: Self-pay | Admitting: *Deleted

## 2023-08-25 DIAGNOSIS — I82402 Acute embolism and thrombosis of unspecified deep veins of left lower extremity: Secondary | ICD-10-CM

## 2023-09-02 ENCOUNTER — Ambulatory Visit: Payer: Medicare HMO | Admitting: Physician Assistant

## 2023-09-02 ENCOUNTER — Ambulatory Visit (HOSPITAL_COMMUNITY)
Admission: RE | Admit: 2023-09-02 | Discharge: 2023-09-02 | Disposition: A | Discharge status: Home / Self Care | Payer: Medicare HMO | Source: Ambulatory Visit | Attending: Vascular Surgery

## 2023-09-02 VITALS — BP 176/78 | HR 82 | Temp 98.4°F | Resp 22 | Ht 68.0 in | Wt 203.0 lb

## 2023-09-02 DIAGNOSIS — I872 Venous insufficiency (chronic) (peripheral): Secondary | ICD-10-CM

## 2023-09-02 DIAGNOSIS — I82402 Acute embolism and thrombosis of unspecified deep veins of left lower extremity: Secondary | ICD-10-CM | POA: Insufficient documentation

## 2023-09-02 DIAGNOSIS — I871 Compression of vein: Secondary | ICD-10-CM

## 2023-09-02 DIAGNOSIS — Z86718 Personal history of other venous thrombosis and embolism: Secondary | ICD-10-CM | POA: Diagnosis not present
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
## 2023-09-06 ENCOUNTER — Encounter (HOSPITAL_COMMUNITY): Payer: Medicare HMO

## 2023-09-06 ENCOUNTER — Ambulatory Visit: Payer: Medicare HMO

## 2023-09-07 DIAGNOSIS — E538 Deficiency of other specified B group vitamins: Secondary | ICD-10-CM | POA: Diagnosis not present

## 2023-09-13 ENCOUNTER — Emergency Department (HOSPITAL_BASED_OUTPATIENT_CLINIC_OR_DEPARTMENT_OTHER): Payer: Medicare HMO

## 2023-09-13 ENCOUNTER — Encounter (HOSPITAL_BASED_OUTPATIENT_CLINIC_OR_DEPARTMENT_OTHER): Payer: Self-pay

## 2023-09-13 ENCOUNTER — Emergency Department (HOSPITAL_BASED_OUTPATIENT_CLINIC_OR_DEPARTMENT_OTHER)
Admission: EM | Admit: 2023-09-13 | Discharge: 2023-09-13 | Disposition: A | Discharge status: Home / Self Care | Payer: Medicare HMO | Attending: Emergency Medicine | Admitting: Emergency Medicine

## 2023-09-13 DIAGNOSIS — R42 Dizziness and giddiness: Secondary | ICD-10-CM | POA: Insufficient documentation

## 2023-09-13 DIAGNOSIS — Z471 Aftercare following joint replacement surgery: Secondary | ICD-10-CM | POA: Diagnosis not present

## 2023-09-13 DIAGNOSIS — Z79899 Other long term (current) drug therapy: Secondary | ICD-10-CM | POA: Diagnosis not present

## 2023-09-13 DIAGNOSIS — I6521 Occlusion and stenosis of right carotid artery: Secondary | ICD-10-CM | POA: Insufficient documentation

## 2023-09-13 DIAGNOSIS — Z7901 Long term (current) use of anticoagulants: Secondary | ICD-10-CM | POA: Insufficient documentation

## 2023-09-13 DIAGNOSIS — I10 Essential (primary) hypertension: Secondary | ICD-10-CM | POA: Diagnosis not present

## 2023-09-13 DIAGNOSIS — I6501 Occlusion and stenosis of right vertebral artery: Secondary | ICD-10-CM | POA: Diagnosis not present

## 2023-09-13 LAB — CBC WITH DIFFERENTIAL/PLATELET
Abs Immature Granulocytes: 0.01 10*3/uL (ref 0.00–0.07)
Basophils Absolute: 0 10*3/uL (ref 0.0–0.1)
Basophils Relative: 0 %
Eosinophils Absolute: 0.1 10*3/uL (ref 0.0–0.5)
Eosinophils Relative: 1 %
HCT: 28.3 % — ABNORMAL LOW (ref 36.0–46.0)
Hemoglobin: 8.4 g/dL — ABNORMAL LOW (ref 12.0–15.0)
Immature Granulocytes: 0 %
Lymphocytes Relative: 29 %
Lymphs Abs: 1.7 10*3/uL (ref 0.7–4.0)
MCH: 21.4 pg — ABNORMAL LOW (ref 26.0–34.0)
MCHC: 29.7 g/dL — ABNORMAL LOW (ref 30.0–36.0)
MCV: 72 fL — ABNORMAL LOW (ref 80.0–100.0)
Monocytes Absolute: 0.7 10*3/uL (ref 0.1–1.0)
Monocytes Relative: 12 %
Neutro Abs: 3.3 10*3/uL (ref 1.7–7.7)
Neutrophils Relative %: 58 %
Platelets: 237 10*3/uL (ref 150–400)
RBC: 3.93 MIL/uL (ref 3.87–5.11)
RDW: 19.2 % — ABNORMAL HIGH (ref 11.5–15.5)
WBC: 5.8 10*3/uL (ref 4.0–10.5)
nRBC: 0 % (ref 0.0–0.2)

## 2023-09-13 LAB — BASIC METABOLIC PANEL
Anion gap: 10 (ref 5–15)
BUN: 9 mg/dL (ref 8–23)
CO2: 26 mmol/L (ref 22–32)
Calcium: 8.8 mg/dL — ABNORMAL LOW (ref 8.9–10.3)
Chloride: 103 mmol/L (ref 98–111)
Creatinine, Ser: 0.79 mg/dL (ref 0.44–1.00)
GFR, Estimated: >60 mL/min (ref >60)
Glucose, Bld: 203 mg/dL — ABNORMAL HIGH (ref 70–99)
Potassium: 3.6 mmol/L (ref 3.5–5.1)
Sodium: 139 mmol/L (ref 135–145)

## 2023-09-13 LAB — TROPONIN I (HIGH SENSITIVITY)
Troponin I (High Sensitivity): 7 ng/L (ref <18)
Troponin I (High Sensitivity): 8 ng/L (ref <18)

## 2023-09-13 LAB — URINALYSIS, ROUTINE W REFLEX MICROSCOPIC
Bilirubin Urine: NEGATIVE
Glucose, UA: NEGATIVE mg/dL
Hgb urine dipstick: NEGATIVE
Ketones, ur: NEGATIVE mg/dL
Leukocytes,Ua: NEGATIVE
Nitrite: NEGATIVE
Protein, ur: NEGATIVE mg/dL
Specific Gravity, Urine: >=1.03 (ref 1.005–1.030)
pH: 5.5 (ref 5.0–8.0)

## 2023-09-13 MED ORDER — HYDRALAZINE HCL 20 MG/ML IJ SOLN
5.0000 mg | Freq: Once | INTRAMUSCULAR | Status: AC
  Administered 2023-09-13: 5 mg via INTRAVENOUS
  Filled 2023-09-13: qty 1

## 2023-09-13 MED ORDER — DIPHENHYDRAMINE HCL 50 MG/ML IJ SOLN
25.0000 mg | Freq: Once | INTRAMUSCULAR | Status: DC
  Filled 2023-09-13: qty 1

## 2023-09-13 MED ORDER — AMLODIPINE BESYLATE 5 MG PO TABS: 5.0000 mg | ORAL_TABLET | Freq: Every day | ORAL | 0 refills | Status: DC

## 2023-09-13 MED ORDER — METOCLOPRAMIDE HCL 5 MG/ML IJ SOLN
10.0000 mg | Freq: Once | INTRAMUSCULAR | Status: DC
  Filled 2023-09-13: qty 2

## 2023-09-13 MED ORDER — IOHEXOL 350 MG/ML SOLN
75.0000 mL | Freq: Once | INTRAVENOUS | Status: AC | PRN
  Administered 2023-09-13: 75 mL via INTRAVENOUS

## 2023-09-13 NOTE — ED Triage Notes (Addendum)
States yesterday took eliquis and within an hour became dizzy & double vision appx 3pm until 9pm, checked bp and it was high. Denies chest pain or dizziness, no headache in triage.  Hasn't taken antihypertensives in "a while".

## 2023-09-13 NOTE — Discharge Instructions (Addendum)
As we discussed your blood pressure was elevated.  I have refilled your Norvasc 5 mg daily.  You also have 50% stenosis of the right internal carotid artery.  I have referred you to a neuro interventionalists for follow-up.  You can also talk about this with your neurologist tomorrow  Please follow-up with your doctor in a week to recheck your blood pressure  Return to ER if you have worse dizziness or headache or blood pressure greater than 200 or trouble speaking or weakness

## 2023-09-13 NOTE — ED Provider Notes (Signed)
[place] [facility] Provider Note   CSN: [id number] Arrival date & time: 09/13/23  1539     History  Chief Complaint  Patient presents with   Hypertension    Diane [name] is a 77 y.o. female here presenting with headache and dizziness and double vision.  Patient just saw vascular surgeon and had a nonocclusive clot that was chronic and was restarted on Eliquis.  Patient took her first dose yesterday.  She states that within an hour she had double vision and felt very dizzy.  She states that her blood pressure was elevated at that time.  However she did not come to the ER.  She states that she is no longer dizzy today after she woke up.  She states that she is walking fine and has no trouble speaking.  She denies any chest pain.  She was complaining about some ongoing chest pressure for several weeks with vascular surgery.  Patient was noted to be hypertensive with blood pressure in the 190s and states that she has not been taking her blood pressure medicine for months now.  She was on Norvasc previously.  The history is provided by the patient.       Home Medications Prior to Admission medications   Medication Sig Start Date End Date Taking? Authorizing Provider  acetaminophen (TYLENOL) 500 MG tablet Take 1,000 mg by mouth every 6 (six) hours as needed for moderate pain or headache.    [provider]  amLODipine (NORVASC) 5 MG tablet Take 5 mg by mouth daily.    [provider]  apixaban (ELIQUIS) 5 MG TABS tablet Take 5 mg by mouth 2 (two) times daily.    [provider]  aspirin EC 81 MG tablet Take 81 mg by mouth daily. Swallow whole.    [provider]  benzonatate (TESSALON) 100 MG capsule Take 1 capsule (100 mg total) by mouth 3 (three) times daily as needed for cough. 06/24/23   Rising, Lurena Joiner, PA-C  bethanechol (URECHOLINE) 5 MG tablet Take 1 tablet (5 mg total) by mouth 3 (three) times daily. 12/25/21    Alwyn Ren, MD  docusate sodium (COLACE) 100 MG capsule Take 2 capsules (200 mg total) by mouth 3 (three) times daily. 12/25/21   Alwyn Ren, MD  DULoxetine (CYMBALTA) 20 MG capsule Take 20 mg by mouth every morning. 04/30/21   [provider]  folic acid (FOLVITE) 1 MG tablet Take 1 tablet (1 mg total) by mouth daily. 02/26/21   Medina-Vargas, Monina C, NP  furosemide (LASIX) 20 MG tablet Take 20 mg by mouth daily. 09/10/21   [provider]  gabapentin (NEURONTIN) 300 MG capsule Take 300 mg in the morning and 600 mg at night.  Take additional 300 mg as needed for severe pain 11/29/22   [provider]  glycerin adult 2 g suppository Place 1 suppository rectally as needed for constipation. 12/25/21   Alwyn Ren, MD  linaclotide [facility]) 290 MCG CAPS capsule Take 1 capsule (290 mcg total) by mouth daily before breakfast. 12/25/21   Alwyn Ren, MD  metFORMIN (GLUCOPHAGE-XR) 500 MG 24 hr tablet Take 1,000 mg by mouth daily with breakfast.    [provider]  methotrexate (RHEUMATREX) 2.5 MG tablet Take 8 tablets (20 mg total) by mouth once a week. 8 tablets weekly (Sunday) Patient taking differently: Take 20 mg by mouth every Sunday. 8 tabs 02/26/21   Medina-Vargas, Margit Banda, NP  polyethylene glycol (MIRALAX / GLYCOLAX) 17 g packet Take 17 g by mouth 3 (three) times daily. 12/25/21   Alwyn Ren, MD  rosuvastatin (CRESTOR) 10 MG tablet Take 1 tablet (10 mg total) by mouth daily. 02/26/21   Medina-Vargas, Monina C, NP  telmisartan (MICARDIS) 80 MG tablet Take 80 mg by mouth at bedtime. 10/12/21   [provider]      Allergies    Cephalexin, Tapentadol, Codeine, and Nucynta [tapentadol hcl]    Review of Systems   Review of Systems  Neurological:  Positive for dizziness and headaches.  All other systems reviewed and are negative.   Physical Exam Updated Vital Signs BP (!) 168/73   Pulse 87   Temp 98.7 F  (37.1 C)   Resp (!) 23   Ht 5\' 8"  (1.727 m)   Wt 92.1 kg   SpO2 100%   BMI 30.87 kg/m  Physical Exam Vitals and nursing note reviewed.  Constitutional:      Comments: Chronically ill but not acutely ill  HENT:     Head: Normocephalic.     Nose: Nose normal.     Mouth/Throat:     Mouth: Mucous membranes are moist.  Eyes:     Extraocular Movements: Extraocular movements intact.     Pupils: Pupils are equal, round, and reactive to light.  Cardiovascular:     Rate and Rhythm: Normal rate and regular rhythm.     Pulses: Normal pulses.     Heart sounds: Normal heart sounds.  Pulmonary:     Effort: Pulmonary effort is normal.     Breath sounds: Normal breath sounds.  Abdominal:     General: Abdomen is flat.     Palpations: Abdomen is soft.  Musculoskeletal:        General: Normal range of motion.     Cervical back: Normal range of motion and neck supple.  Skin:    General: Skin is warm.     Capillary Refill: Capillary refill takes less than 2 seconds.  Neurological:     General: No focal deficit present.     Mental Status: She is oriented to person, place, and time.     Comments: No obvious nystagmus.  Patient has normal strength and sensation bilateral arms and legs.  Normal finger-to-nose bilaterally.  Patient had negative Romberg sign.  Patient has normal gait.  Psychiatric:        Mood and Affect: Mood normal.        Behavior: Behavior normal.     ED Results / Procedures / Treatments   Labs (all labs ordered are listed, but only abnormal results are displayed) Labs Reviewed  BASIC METABOLIC PANEL - Abnormal; Notable for the following components:      Result Value   Glucose, Bld 203 (*)    Calcium 8.8 (*)    All other components within normal limits  CBC WITH DIFFERENTIAL/PLATELET - Abnormal; Notable for the following components:   Hemoglobin 8.4 (*)    HCT 28.3 (*)    MCV 72.0 (*)    MCH 21.4 (*)    MCHC 29.7 (*)    RDW 19.2 (*)    All other components  within normal limits  URINALYSIS, ROUTINE W REFLEX MICROSCOPIC  CBC  TROPONIN I (HIGH SENSITIVITY)    EKG EKG Interpretation Date/Time:  Tuesday September 13 2023 16:10:28 EST Ventricular Rate:  81 PR Interval:  158 QRS Duration:  91 QT Interval:  418 QTC Calculation: 486 R Axis:  44  Text Interpretation: Sinus rhythm Atrial premature complex Borderline T wave abnormalities Borderline prolonged QT interval No significant change since last tracing Confirmed by Richardean Canal [phone]) on 09/13/2023 5:10:04 PM  Radiology No results found.  Procedures Procedures    Medications Ordered in ED Medications  hydrALAZINE (APRESOLINE) injection 5 mg (5 mg Intravenous Given 09/13/23 1756)    ED Course/ Medical Decision Making/ A&P                                 Medical Decision Making ZELAH WYLAND is a 77 y.o. female here presenting with dizziness.  Patient has acute onset of dizziness about 24 hours ago.  Patient is also hypertensive in the ED.  Patient has normal neuroexam now.  She is already on Eliquis.  I think she likely has symptomatic hypertension.  Discussed with Dr. Iver Nestle from neurology.  She recommends CTA to rule out dissection or LVO.  She agreed with BP meds.  If CTA unremarkable we will hold off on MRI.  7:59 PM CT head showed no LVO.  Patient does have 50% stenosis of the right ICA.  Will refer to neurointerventional as outpatient.  Patient does have a neurology appointment tomorrow.  Will refill her Norvasc and I think she has symptomatic hypertension.  Problems Addressed: Dizziness: acute illness or injury Hypertension, unspecified type: chronic illness or injury with exacerbation, progression, or side effects of treatment Stenosis of right internal carotid artery: chronic illness or injury  Amount and/or Complexity of Data Reviewed Labs: ordered. Decision-making details documented in ED Course. Radiology: ordered and independent interpretation performed.  Decision-making details documented in ED Course.  Risk Prescription drug management.    Final Clinical Impression(s) / ED Diagnoses Final diagnoses:  None    Rx / DC Orders ED Discharge Orders     None         Charlynne Pander, MD 09/13/23 2000

## 2023-09-13 NOTE — ED Notes (Signed)
Fall risk armband Fall risk socks Fall risk sign on door

## 2023-09-14 DIAGNOSIS — L299 Pruritus, unspecified: Secondary | ICD-10-CM | POA: Diagnosis not present

## 2023-09-14 DIAGNOSIS — E538 Deficiency of other specified B group vitamins: Secondary | ICD-10-CM | POA: Diagnosis not present

## 2023-09-16 ENCOUNTER — Other Ambulatory Visit: Payer: Self-pay

## 2023-09-16 ENCOUNTER — Emergency Department (HOSPITAL_COMMUNITY)
Admission: EM | Admit: 2023-09-16 | Discharge: 2023-09-17 | Disposition: A | Discharge status: Home / Self Care | Payer: Medicare HMO | Attending: Emergency Medicine | Admitting: Emergency Medicine

## 2023-09-16 ENCOUNTER — Other Ambulatory Visit (HOSPITAL_COMMUNITY): Payer: Self-pay | Admitting: Emergency Medicine

## 2023-09-16 ENCOUNTER — Emergency Department (HOSPITAL_COMMUNITY): Payer: Medicare HMO

## 2023-09-16 ENCOUNTER — Ambulatory Visit: Payer: Self-pay

## 2023-09-16 ENCOUNTER — Encounter (HOSPITAL_COMMUNITY): Payer: Self-pay

## 2023-09-16 DIAGNOSIS — E119 Type 2 diabetes mellitus without complications: Secondary | ICD-10-CM | POA: Insufficient documentation

## 2023-09-16 DIAGNOSIS — I771 Stricture of artery: Secondary | ICD-10-CM

## 2023-09-16 DIAGNOSIS — K5900 Constipation, unspecified: Secondary | ICD-10-CM | POA: Insufficient documentation

## 2023-09-16 DIAGNOSIS — I10 Essential (primary) hypertension: Secondary | ICD-10-CM | POA: Insufficient documentation

## 2023-09-16 DIAGNOSIS — Z9049 Acquired absence of other specified parts of digestive tract: Secondary | ICD-10-CM | POA: Diagnosis not present

## 2023-09-16 DIAGNOSIS — R140 Abdominal distension (gaseous): Secondary | ICD-10-CM | POA: Diagnosis not present

## 2023-09-16 NOTE — ED Triage Notes (Signed)
Pt states she has not had a BM x 6 days; using laxatives at home without relief; denies pain, denies n/v; endorses abd bloating, endorses hx bowel obstruction

## 2023-09-16 NOTE — ED Provider Triage Note (Signed)
Emergency Medicine Provider Triage Evaluation Note  Diane [name] , a 77 y.o. female  was evaluated in triage.  Pt complains of abdominal swelling and no BM in 6 days  Review of Systems  Positive: Constipation and swelling Negative: fever  Physical Exam  BP (!) 165/78 (BP Location: Right Arm)   Pulse 86   Temp 98.4 F (36.9 C) (Oral)   Resp 18   SpO2 100%  Gen:   Awake, no distress   Resp:  Normal effort  MSK:   Moves extremities without difficulty  Other:  Abdomen swollen nontender   Medical Decision Making  Medically screening exam initiated at 2:04 PM.  Appropriate orders placed.  Diane [name] was informed that the remainder of the evaluation will be completed by another provider, this initial triage assessment does not replace that evaluation, and the importance of remaining in the ED until their evaluation is complete.     Diane [name], [place] 09/16/23 [phone]

## 2023-09-16 NOTE — Telephone Encounter (Signed)
  Chief Complaint: No BM  6 days - abdominal  Symptoms: No Bm 6 days - no urge - abdominal bloating. Frequency: 6 days Pertinent Negatives: Patient denies  Disposition: [x] ED /[] Urgent Care (no appt availability in office) / [] Appointment(In office/virtual)/ []  [facility]/ [] Home Care/ [] Refused Recommended Disposition /[] [facility]/ [x]  Follow-up with PCP Additional Notes: Pt states that she has not had a BM for 6 days. No stools, no urge and abdomen is very swollen. Spoke to pcp yesterday, who advised laxative. Pt took laxative without and resolution.  Pt will call PCP to be seen today. PT will go to ED if pcp is unable to pcp.  Summary: swollen stomach   Patient called stated she has not had a bowel movement in 6 days and her stomach is swollen and moved to her right side without pain. Please f/u with patient     Reason for Disposition  Last bowel movement (BM) > 4 days ago  Answer Assessment - Initial Assessment Questions 1. STOOL PATTERN OR FREQUENCY: "How often do you have a bowel movement (BM)?"  (Normal range: 3 times a day to every 3 days)  "When was your last BM?"       6 days 2. STRAINING: "Do you have to strain to have a BM?"      no 3. RECTAL PAIN: "Does your rectum hurt when the stool comes out?" If Yes, ask: "Do you have hemorrhoids? How bad is the pain?"  (Scale 1-10; or mild, moderate, severe)     no 4. STOOL COMPOSITION: "Are the stools hard?"      no 5. BLOOD ON STOOLS: "Has there been any blood on the toilet tissue or on the surface of the BM?" If Yes, ask: "When was the last time?"     no 6. CHRONIC CONSTIPATION: "Is this a new problem for you?"  If No, ask: "How long have you had this problem?" (days, weeks, months)      6 days 7. CHANGES IN DIET OR HYDRATION: "Have there been any recent changes in your diet?" "How much fluids are you drinking on a daily basis?"  "How much have you had to drink today?"     no 8. MEDICINES: "Have you been  taking any new medicines?" "Are you taking any narcotic pain medicines?" (e.g., Dilaudid, morphine, Percocet, Vicodin)     gabapentin 9. LAXATIVES: "Have you been using any stool softeners, laxatives, or enemas?"  If Yes, ask "What, how often, and when was the last time?"     yes 13. MEDICAL HISTORY: "Do you have a history of hemorrhoids, rectal fissures, or rectal surgery or rectal abscess?"         Ongoing stomach issues  Protocols used: Constipation-A-AH

## 2023-09-16 NOTE — ED Notes (Signed)
Tech unable to retrieve O2 sat during repeat vitals due to pt. finger being cold. Tech asked pt. If she currently felt sob and she said no. Pt. Told to make tech aware if that changes.

## 2023-09-17 ENCOUNTER — Emergency Department (HOSPITAL_COMMUNITY): Payer: Medicare HMO

## 2023-09-17 DIAGNOSIS — R140 Abdominal distension (gaseous): Secondary | ICD-10-CM | POA: Diagnosis not present

## 2023-09-17 DIAGNOSIS — Z9049 Acquired absence of other specified parts of digestive tract: Secondary | ICD-10-CM | POA: Diagnosis not present

## 2023-09-17 LAB — URINALYSIS, ROUTINE W REFLEX MICROSCOPIC
Bilirubin Urine: NEGATIVE
Glucose, UA: NEGATIVE mg/dL
Hgb urine dipstick: NEGATIVE
Ketones, ur: NEGATIVE mg/dL
Leukocytes,Ua: NEGATIVE
Nitrite: NEGATIVE
Protein, ur: NEGATIVE mg/dL
Specific Gravity, Urine: 1.014 (ref 1.005–1.030)
pH: 5 (ref 5.0–8.0)

## 2023-09-17 LAB — CBC WITH DIFFERENTIAL/PLATELET
Abs Immature Granulocytes: 0.01 10*3/uL (ref 0.00–0.07)
Basophils Absolute: 0 10*3/uL (ref 0.0–0.1)
Basophils Relative: 0 %
Eosinophils Absolute: 0.1 10*3/uL (ref 0.0–0.5)
Eosinophils Relative: 2 %
HCT: 32.4 % — ABNORMAL LOW (ref 36.0–46.0)
Hemoglobin: 9.5 g/dL — ABNORMAL LOW (ref 12.0–15.0)
Immature Granulocytes: 0 %
Lymphocytes Relative: 33 %
Lymphs Abs: 2 10*3/uL (ref 0.7–4.0)
MCH: 21.8 pg — ABNORMAL LOW (ref 26.0–34.0)
MCHC: 29.3 g/dL — ABNORMAL LOW (ref 30.0–36.0)
MCV: 74.3 fL — ABNORMAL LOW (ref 80.0–100.0)
Monocytes Absolute: 0.7 10*3/uL (ref 0.1–1.0)
Monocytes Relative: 11 %
Neutro Abs: 3.3 10*3/uL (ref 1.7–7.7)
Neutrophils Relative %: 54 %
Platelets: 226 10*3/uL (ref 150–400)
RBC: 4.36 MIL/uL (ref 3.87–5.11)
RDW: 19.1 % — ABNORMAL HIGH (ref 11.5–15.5)
WBC: 6.1 10*3/uL (ref 4.0–10.5)
nRBC: 0 % (ref 0.0–0.2)

## 2023-09-17 LAB — COMPREHENSIVE METABOLIC PANEL
ALT: 13 U/L (ref 0–44)
AST: 20 U/L (ref 15–41)
Albumin: 3.5 g/dL (ref 3.5–5.0)
Alkaline Phosphatase: 52 U/L (ref 38–126)
Anion gap: 10 (ref 5–15)
BUN: 10 mg/dL (ref 8–23)
CO2: 22 mmol/L (ref 22–32)
Calcium: 9.4 mg/dL (ref 8.9–10.3)
Chloride: 108 mmol/L (ref 98–111)
Creatinine, Ser: 0.45 mg/dL (ref 0.44–1.00)
GFR, Estimated: >60 mL/min (ref >60)
Glucose, Bld: 156 mg/dL — ABNORMAL HIGH (ref 70–99)
Potassium: 3.7 mmol/L (ref 3.5–5.1)
Sodium: 140 mmol/L (ref 135–145)
Total Bilirubin: 0.5 mg/dL (ref <1.2)
Total Protein: 7.3 g/dL (ref 6.5–8.1)

## 2023-09-17 LAB — LIPASE, BLOOD: Lipase: 48 U/L (ref 11–51)

## 2023-09-17 MED ORDER — MORPHINE SULFATE (PF) 4 MG/ML IV SOLN
4.0000 mg | Freq: Once | INTRAVENOUS | Status: AC
  Administered 2023-09-17: 4 mg via INTRAVENOUS
  Filled 2023-09-17: qty 1

## 2023-09-17 MED ORDER — GOLYTELY 236 G PO SOLR
4000.0000 mL | Freq: Once | ORAL | 0 refills | Status: AC
Start: 2023-09-17 — End: 2023-09-17

## 2023-09-17 MED ORDER — GLYCERIN (ADULT) 2 G RE SUPP: 1.0000 | RECTAL | 0 refills | Status: DC | PRN

## 2023-09-17 MED ORDER — BISACODYL 10 MG RE SUPP: 10.0000 mg | RECTAL | 0 refills | Status: DC | PRN

## 2023-09-17 MED ORDER — IOHEXOL 350 MG/ML SOLN
75.0000 mL | Freq: Once | INTRAVENOUS | Status: AC | PRN
  Administered 2023-09-17: 75 mL via INTRAVENOUS

## 2023-09-17 NOTE — ED Notes (Signed)
Spoke to daughter via phone to transport pt home.

## 2023-09-17 NOTE — ED Notes (Signed)
Blue top as well as type and screen in lab if needed

## 2023-09-17 NOTE — Discharge Instructions (Signed)
You were evaluated in the Emergency Department and after careful evaluation, we did not find any emergent condition requiring admission or further testing in the hospital.  Your exam/testing today was overall reassuring.  Symptoms seem to be due to constipation.  No signs of blockages.  Take the polyethylene glycol as if you are prepping for colonoscopy as we discussed.  Use the suppositories as needed.  Please return to the Emergency Department if you experience any worsening of your condition.  Thank you for allowing [place] to be a part of your care.
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
## 2023-09-19 ENCOUNTER — Ambulatory Visit (HOSPITAL_COMMUNITY)
Admission: RE | Admit: 2023-09-19 | Discharge: 2023-09-19 | Disposition: A | Discharge status: Home / Self Care | Payer: Medicare HMO | Source: Ambulatory Visit | Attending: Emergency Medicine | Admitting: Emergency Medicine

## 2023-09-19 ENCOUNTER — Other Ambulatory Visit (HOSPITAL_COMMUNITY): Payer: Self-pay | Admitting: Neuroradiology

## 2023-09-19 DIAGNOSIS — I771 Stricture of artery: Secondary | ICD-10-CM

## 2023-09-19 DIAGNOSIS — H532 Diplopia: Secondary | ICD-10-CM
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
## 2023-09-22 ENCOUNTER — Other Ambulatory Visit: Payer: Self-pay

## 2023-09-22 DIAGNOSIS — I871 Compression of vein: Secondary | ICD-10-CM

## 2023-09-23 DIAGNOSIS — G479 Sleep disorder, unspecified: Secondary | ICD-10-CM | POA: Diagnosis not present

## 2023-09-23 DIAGNOSIS — E538 Deficiency of other specified B group vitamins: Secondary | ICD-10-CM | POA: Diagnosis not present

## 2023-09-23 DIAGNOSIS — G912 (Idiopathic) normal pressure hydrocephalus: Secondary | ICD-10-CM | POA: Diagnosis not present

## 2023-09-23 DIAGNOSIS — R519 Headache, unspecified: Secondary | ICD-10-CM | POA: Diagnosis not present

## 2023-09-23 DIAGNOSIS — R42 Dizziness and giddiness: Secondary | ICD-10-CM | POA: Diagnosis not present

## 2023-09-23 DIAGNOSIS — R200 Anesthesia of skin: Secondary | ICD-10-CM | POA: Diagnosis not present

## 2023-09-23 DIAGNOSIS — R413 Other amnesia: Secondary | ICD-10-CM | POA: Diagnosis not present

## 2023-09-23 DIAGNOSIS — R2689 Other abnormalities of gait and mobility: Secondary | ICD-10-CM | POA: Diagnosis not present

## 2023-09-23 DIAGNOSIS — H9203 Otalgia, bilateral: Secondary | ICD-10-CM | POA: Diagnosis not present

## 2023-09-27 ENCOUNTER — Other Ambulatory Visit: Payer: Self-pay | Admitting: Physician Assistant

## 2023-09-27 DIAGNOSIS — R42 Dizziness and giddiness: Secondary | ICD-10-CM | POA: Diagnosis not present

## 2023-09-27 DIAGNOSIS — E538 Deficiency of other specified B group vitamins: Secondary | ICD-10-CM | POA: Diagnosis not present

## 2023-09-27 DIAGNOSIS — R200 Anesthesia of skin: Secondary | ICD-10-CM | POA: Diagnosis not present

## 2023-09-27 DIAGNOSIS — H532 Diplopia: Secondary | ICD-10-CM

## 2023-09-27 DIAGNOSIS — R2990 Unspecified symptoms and signs involving the nervous system: Secondary | ICD-10-CM

## 2023-09-27 DIAGNOSIS — R2689 Other abnormalities of gait and mobility: Secondary | ICD-10-CM | POA: Diagnosis not present

## 2023-09-27 DIAGNOSIS — G479 Sleep disorder, unspecified: Secondary | ICD-10-CM | POA: Diagnosis not present

## 2023-09-27 DIAGNOSIS — G912 (Idiopathic) normal pressure hydrocephalus: Secondary | ICD-10-CM | POA: Diagnosis not present

## 2023-09-27 DIAGNOSIS — R413 Other amnesia: Secondary | ICD-10-CM | POA: Diagnosis not present

## 2023-09-27 DIAGNOSIS — R519 Headache, unspecified: Secondary | ICD-10-CM | POA: Diagnosis not present

## 2023-09-27 DIAGNOSIS — R202 Paresthesia of skin: Secondary | ICD-10-CM | POA: Diagnosis not present

## 2023-09-27 DIAGNOSIS — H9203 Otalgia, bilateral: Secondary | ICD-10-CM | POA: Diagnosis not present

## 2023-09-28 DIAGNOSIS — H43813 Vitreous degeneration, bilateral: Secondary | ICD-10-CM | POA: Diagnosis not present

## 2023-09-28 DIAGNOSIS — H33321 Round hole, right eye: Secondary | ICD-10-CM | POA: Diagnosis not present

## 2023-10-10 ENCOUNTER — Other Ambulatory Visit (HOSPITAL_COMMUNITY): Payer: Self-pay | Admitting: Respiratory Therapy

## 2023-10-10 DIAGNOSIS — M349 Systemic sclerosis, unspecified: Secondary | ICD-10-CM

## 2023-10-14 ENCOUNTER — Ambulatory Visit
Admission: RE | Admit: 2023-10-14 | Discharge: 2023-10-14 | Disposition: A | Discharge status: Home / Self Care | Payer: Medicare HMO | Source: Ambulatory Visit | Attending: Physician Assistant | Admitting: Physician Assistant

## 2023-10-14 DIAGNOSIS — H532 Diplopia: Secondary | ICD-10-CM | POA: Diagnosis not present

## 2023-10-14 DIAGNOSIS — G319 Degenerative disease of nervous system, unspecified: Secondary | ICD-10-CM | POA: Diagnosis not present

## 2023-10-14 DIAGNOSIS — R2990 Unspecified symptoms and signs involving the nervous system: Secondary | ICD-10-CM

## 2023-10-14 DIAGNOSIS — I6782 Cerebral ischemia: Secondary | ICD-10-CM | POA: Diagnosis not present

## 2023-10-31 DIAGNOSIS — Z961 Presence of intraocular lens: Secondary | ICD-10-CM | POA: Diagnosis not present

## 2023-10-31 DIAGNOSIS — H43813 Vitreous degeneration, bilateral: Secondary | ICD-10-CM | POA: Diagnosis not present

## 2023-10-31 DIAGNOSIS — H33321 Round hole, right eye: Secondary | ICD-10-CM | POA: Diagnosis not present

## 2023-11-07 DIAGNOSIS — Z8673 Personal history of transient ischemic attack (TIA), and cerebral infarction without residual deficits: Secondary | ICD-10-CM | POA: Diagnosis not present

## 2023-11-07 DIAGNOSIS — I10 Essential (primary) hypertension: Secondary | ICD-10-CM | POA: Diagnosis not present

## 2023-11-07 DIAGNOSIS — Z7901 Long term (current) use of anticoagulants: Secondary | ICD-10-CM | POA: Diagnosis not present

## 2023-11-07 DIAGNOSIS — I2510 Atherosclerotic heart disease of native coronary artery without angina pectoris: Secondary | ICD-10-CM | POA: Diagnosis not present

## 2023-11-07 DIAGNOSIS — E782 Mixed hyperlipidemia: Secondary | ICD-10-CM | POA: Diagnosis not present

## 2023-11-07 NOTE — Progress Notes (Signed)
 New Patient Visit    Chief Complaint: Establish care for CAD, hypertension  Date of Service: 11/07/2023 Date of Birth: Jun 04, 1946 PCP: [name], MD  History of Present Illness: Diane [name] is a 78 y.o.female patient who presented to establish care for CAD, hypertension.  Past medical history significant for mild nonobstructive CAD on CTA coronaries 07/2021 which showed CAC score of 303 with nonobstructive mild multivessel disease.  Left heart cath normal 2018.  Other history include recurrent DVT on Eliquis , hypertension, diabetes, hyperlipidemia, 50% right ICA stenosis.  Last echo 07/2021 with normal biventricular systolic function, LVEF 60 to 34%, no significant valvular abnormality.  Today patient details that she is overall stable from cardiac standpoint.  Has occasional left-sided aching chest discomfort which she has for years, mostly happens at rest and prior CTA coronary done for the same reason with mild nonobstructive CAD.  No anginal chest discomfort or increased shortness of breath.  No palpitations, dizziness.  Detailed that her blood pressure was running high before which is normal today.  EKG today showed sinus rhythm with possible anteroseptal infarct  Past Medical and Surgical History  Past Medical History Past Medical History:  Diagnosis Date  . Cataract cortical, senile   . Type 2 diabetes mellitus (CMS/HHS-HCC)   . Vision abnormalities     Past Surgical History She has a past surgical history that includes shoulder surgery; Cholecystectomy open; lens eye surgery (Left, 2012 or 2013); lens eye surgery (Right, 11/13/2018); and right foot surgery (11/02/2022).   Medications and Allergies  Current Medications Current Outpatient Medications  Medication Sig Dispense Refill  . amLODIPine  (NORVASC ) 5 MG tablet Take 5 mg by mouth 2 (two) times daily    . apixaban  (ELIQUIS ) 5 mg tablet Take 5 mg by mouth every 12 (twelve) hours    . diclofenac  (VOLTAREN ) 1 % topical gel  Apply 2 g topically 4 (four) times daily    . gabapentin  (NEURONTIN ) 300 MG capsule Take 300 mg in the morning and 600 mg at night for head pain. Can take an additional 300 mg as needed. 120 capsule 1  . aspirin  81 MG chewable tablet Take 81 mg by mouth once daily (Patient not taking: Reported on 11/07/2023)    . atorvastatin (LIPITOR) 20 MG tablet Take 1 tablet (20 mg total) by mouth once daily 30 tablet 11  . DULoxetine  (CYMBALTA ) 20 MG DR capsule Take 20 mg by mouth once daily (Patient not taking: Reported on 11/07/2023)    . fexofenadine (ALLEGRA) 60 MG tablet Take 60 mg by mouth 2 (two) times daily as needed (Patient not taking: Reported on 11/07/2023)    . folic acid  (FOLVITE ) 1 MG tablet Take 1 mg by mouth once daily (Patient not taking: Reported on 07/18/2023)    . metFORMIN  (GLUCOPHAGE -XR) 500 MG XR tablet Take 500 mg by mouth 2 (two) times daily (Patient not taking: Reported on 11/07/2023)    . methotrexate  (RHEUMATREX) 2.5 MG tablet Take 2.5 mg by mouth every 7 (seven) days (Patient not taking: Reported on 11/07/2023)  3  . predniSONE  (DELTASONE ) 1 MG tablet Take 1 mg by mouth once daily (Patient not taking: Reported on 11/07/2023)    . vibegron 75 mg Tab Take 75 mg by mouth once daily (Patient not taking: Reported on 05/16/2023)     No current facility-administered medications for this visit.    Allergies Cephalexin, Codeine, Grass pollen, and Tapentadol hcl  Social and Family History  Social History  reports that she has never  smoked. She has never used smokeless tobacco. She reports that she does not drink alcohol and does not use drugs.  Family History family history includes No Known Problems in her brother, maternal aunt, maternal grandfather, maternal grandmother, maternal uncle, mother, paternal aunt, paternal grandfather, paternal grandmother, paternal uncle, sister, and another family member; Rheum arthritis in her father.   Review of Systems   Review of Systems:  Chronic  occasional nonanginal chest discomfort  Physical Examination   Vitals:BP 130/76 (BP Location: Left upper arm, Patient Position: Sitting, BP Cuff Size: Adult)   Pulse 85   Resp 16   Ht 172.7 cm (5' 8)   Wt 88 kg (194 lb)   SpO2 99%   BMI 29.50 kg/m  Ht:172.7 cm (5' 8) Wt:88 kg (194 lb) ADJ:Anib surface area is 2.05 meters squared. Body mass index is 29.5 kg/m.  HEENT: Pupils equally reactive to light and accomodation  Neck: Supple, no significant JVD Lungs: clear to auscultation bilaterally; no wheezes, rales, rhonchi Heart: Regular rate and rhythm. No murmur Extremities: no pedal edema  Assessment and Plan   78 y.o. female with  Mild nonobstructive CAD on CTA coronary 07/2021 Hypertension Hyperlipidemia/diabetes Carotid artery stenosis Recurrent DVT  Stable from cardiac standpoint.  No anginal symptoms.  Euvolemic on exam. Blood pressure well-controlled today, continue amlodipine . She is on Eliquis  for anticoagulation for recurrent DVT, followed by vascular. Will start atorvastatin 20 mg daily with her history of CAD/carotid artery stenosis Heart healthy diet and regular activity as tolerated  Orders Placed This Encounter  Procedures  . ECG 12-lead    Return in about 6 months (around 05/06/2024).  KRISHNA CHAITANYA ALLURI, MD  This dictation was prepared with dragon dictation. Any transcription errors that result from this process are unintentional.

## 2023-11-28 DIAGNOSIS — Z5912 Inadequate housing utilities: Secondary | ICD-10-CM | POA: Diagnosis not present

## 2023-11-28 DIAGNOSIS — Z5982 Transportation insecurity: Secondary | ICD-10-CM | POA: Diagnosis not present

## 2023-11-28 DIAGNOSIS — Z5941 Food insecurity: Secondary | ICD-10-CM | POA: Diagnosis not present

## 2023-11-28 DIAGNOSIS — R2990 Unspecified symptoms and signs involving the nervous system: Secondary | ICD-10-CM | POA: Diagnosis not present

## 2023-11-28 DIAGNOSIS — H532 Diplopia: Secondary | ICD-10-CM | POA: Diagnosis not present

## 2023-11-28 DIAGNOSIS — R2689 Other abnormalities of gait and mobility: Secondary | ICD-10-CM | POA: Diagnosis not present

## 2023-11-28 DIAGNOSIS — G479 Sleep disorder, unspecified: Secondary | ICD-10-CM | POA: Diagnosis not present

## 2023-11-28 DIAGNOSIS — H9203 Otalgia, bilateral: Secondary | ICD-10-CM | POA: Diagnosis not present

## 2023-11-28 DIAGNOSIS — R413 Other amnesia: Secondary | ICD-10-CM | POA: Diagnosis not present

## 2023-11-28 DIAGNOSIS — R519 Headache, unspecified: Secondary | ICD-10-CM | POA: Diagnosis not present

## 2023-11-29 DIAGNOSIS — R1312 Dysphagia, oropharyngeal phase: Secondary | ICD-10-CM | POA: Diagnosis not present

## 2023-11-29 DIAGNOSIS — E1165 Type 2 diabetes mellitus with hyperglycemia: Secondary | ICD-10-CM | POA: Diagnosis not present

## 2023-12-03 ENCOUNTER — Emergency Department (HOSPITAL_BASED_OUTPATIENT_CLINIC_OR_DEPARTMENT_OTHER): Payer: Medicare HMO

## 2023-12-03 ENCOUNTER — Emergency Department (HOSPITAL_BASED_OUTPATIENT_CLINIC_OR_DEPARTMENT_OTHER)
Admission: EM | Admit: 2023-12-03 | Discharge: 2023-12-03 | Disposition: A | Discharge status: Home / Self Care | Payer: Medicare HMO | Attending: Emergency Medicine | Admitting: Emergency Medicine

## 2023-12-03 ENCOUNTER — Encounter (HOSPITAL_BASED_OUTPATIENT_CLINIC_OR_DEPARTMENT_OTHER): Payer: Self-pay

## 2023-12-03 ENCOUNTER — Other Ambulatory Visit: Payer: Self-pay

## 2023-12-03 DIAGNOSIS — I517 Cardiomegaly: Secondary | ICD-10-CM | POA: Insufficient documentation

## 2023-12-03 DIAGNOSIS — I10 Essential (primary) hypertension: Secondary | ICD-10-CM | POA: Insufficient documentation

## 2023-12-03 DIAGNOSIS — R079 Chest pain, unspecified: Secondary | ICD-10-CM | POA: Insufficient documentation

## 2023-12-03 DIAGNOSIS — I700 Atherosclerosis of aorta: Secondary | ICD-10-CM | POA: Diagnosis not present

## 2023-12-03 DIAGNOSIS — E785 Hyperlipidemia, unspecified: Secondary | ICD-10-CM | POA: Insufficient documentation

## 2023-12-03 DIAGNOSIS — R042 Hemoptysis: Secondary | ICD-10-CM | POA: Insufficient documentation

## 2023-12-03 DIAGNOSIS — Z79899 Other long term (current) drug therapy: Secondary | ICD-10-CM | POA: Insufficient documentation

## 2023-12-03 DIAGNOSIS — R040 Epistaxis: Secondary | ICD-10-CM | POA: Insufficient documentation

## 2023-12-03 DIAGNOSIS — Z7901 Long term (current) use of anticoagulants: Secondary | ICD-10-CM | POA: Insufficient documentation

## 2023-12-03 DIAGNOSIS — R0789 Other chest pain: Secondary | ICD-10-CM | POA: Diagnosis not present

## 2023-12-03 DIAGNOSIS — Z7982 Long term (current) use of aspirin: Secondary | ICD-10-CM | POA: Insufficient documentation

## 2023-12-03 DIAGNOSIS — R0989 Other specified symptoms and signs involving the circulatory and respiratory systems: Secondary | ICD-10-CM | POA: Diagnosis not present

## 2023-12-03 DIAGNOSIS — Z96611 Presence of right artificial shoulder joint: Secondary | ICD-10-CM | POA: Diagnosis not present

## 2023-12-03 DIAGNOSIS — E1169 Type 2 diabetes mellitus with other specified complication: Secondary | ICD-10-CM | POA: Insufficient documentation

## 2023-12-03 DIAGNOSIS — Z7984 Long term (current) use of oral hypoglycemic drugs: Secondary | ICD-10-CM | POA: Insufficient documentation

## 2023-12-03 LAB — TROPONIN I (HIGH SENSITIVITY)
Troponin I (High Sensitivity): 6 ng/L (ref <18)
Troponin I (High Sensitivity): 6 ng/L (ref <18)

## 2023-12-03 LAB — BASIC METABOLIC PANEL
Anion gap: 8 (ref 5–15)
BUN: 11 mg/dL (ref 8–23)
CO2: 25 mmol/L (ref 22–32)
Calcium: 8.8 mg/dL — ABNORMAL LOW (ref 8.9–10.3)
Chloride: 108 mmol/L (ref 98–111)
Creatinine, Ser: 0.51 mg/dL (ref 0.44–1.00)
GFR, Estimated: >60 mL/min (ref >60)
Glucose, Bld: 97 mg/dL (ref 70–99)
Potassium: 3.7 mmol/L (ref 3.5–5.1)
Sodium: 141 mmol/L (ref 135–145)

## 2023-12-03 LAB — CBC WITH DIFFERENTIAL/PLATELET
Abs Immature Granulocytes: 0.02 10*3/uL (ref 0.00–0.07)
Basophils Absolute: 0 10*3/uL (ref 0.0–0.1)
Basophils Relative: 0 %
Eosinophils Absolute: 0.2 10*3/uL (ref 0.0–0.5)
Eosinophils Relative: 3 %
HCT: 27.9 % — ABNORMAL LOW (ref 36.0–46.0)
Hemoglobin: 8.1 g/dL — ABNORMAL LOW (ref 12.0–15.0)
Immature Granulocytes: 0 %
Lymphocytes Relative: 32 %
Lymphs Abs: 2 10*3/uL (ref 0.7–4.0)
MCH: 20.9 pg — ABNORMAL LOW (ref 26.0–34.0)
MCHC: 29 g/dL — ABNORMAL LOW (ref 30.0–36.0)
MCV: 71.9 fL — ABNORMAL LOW (ref 80.0–100.0)
Monocytes Absolute: 0.5 10*3/uL (ref 0.1–1.0)
Monocytes Relative: 9 %
Neutro Abs: 3.5 10*3/uL (ref 1.7–7.7)
Neutrophils Relative %: 56 %
Platelets: 216 10*3/uL (ref 150–400)
RBC: 3.88 MIL/uL (ref 3.87–5.11)
RDW: 19.3 % — ABNORMAL HIGH (ref 11.5–15.5)
WBC: 6.2 10*3/uL (ref 4.0–10.5)
nRBC: 0 % (ref 0.0–0.2)

## 2023-12-03 LAB — BRAIN NATRIURETIC PEPTIDE: B Natriuretic Peptide: 91.5 pg/mL (ref 0.0–100.0)

## 2023-12-03 MED ORDER — SALINE SPRAY 0.65 % NA SOLN
1.0000 | NASAL | 0 refills | Status: DC | PRN
Start: 2023-12-03 — End: 2024-01-16

## 2023-12-03 MED ORDER — OXYMETAZOLINE HCL 0.05 % NA SOLN
1.0000 | Freq: Once | NASAL | Status: AC
  Administered 2023-12-03: 1 via NASAL
  Filled 2023-12-03: qty 30

## 2023-12-03 NOTE — ED Notes (Signed)
AVS provided by edp was reviewed with pt. Pt verbalized understanding with no additional questions at this time. Pharmacy verified by pt. Pt departs via wheelchair to car going home with daughter at bedside.

## 2023-12-03 NOTE — Discharge Instructions (Addendum)
You can use a saline nasal spray to help out with your nosebleeds.  If you have a repeat nosebleed, spray Afrin in your nose and hold pressure and wait for 15 minutes to see if it resolves.  Your blood work that was done today showed that your hemoglobin has stayed a little bit on the low side.  Please follow-up with your primary care doctor about this.  Your chest x-ray, and test to look for injury to your heart were normal.

## 2023-12-03 NOTE — ED Notes (Signed)
Second RN at bedside attempting IV [place]

## 2023-12-03 NOTE — ED Notes (Signed)
EDP stevens informed pt is still bleeding from right nostril following afrin administration. ENT cart at bedside
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
## 2023-12-03 NOTE — ED Notes (Signed)
EKG obtained and given to Dr.Stevens

## 2023-12-03 NOTE — ED Notes (Signed)
Nose bleeding controlled

## 2023-12-03 NOTE — ED Triage Notes (Signed)
Patient arrives POV with complaints of nosebleed (resolved) earlier today.  Patient states that she was coughing up blood as well.  No pain.

## 2023-12-14 NOTE — Progress Notes (Signed)
Diane [name] Kitchen

## 2024-01-03 DIAGNOSIS — R351 Nocturia: Secondary | ICD-10-CM | POA: Diagnosis not present

## 2024-01-03 DIAGNOSIS — R3915 Urgency of urination: Secondary | ICD-10-CM | POA: Diagnosis not present

## 2024-01-05 DIAGNOSIS — E782 Mixed hyperlipidemia: Secondary | ICD-10-CM | POA: Diagnosis not present

## 2024-01-05 DIAGNOSIS — I700 Atherosclerosis of aorta: Secondary | ICD-10-CM | POA: Diagnosis not present

## 2024-01-05 DIAGNOSIS — M349 Systemic sclerosis, unspecified: Secondary | ICD-10-CM | POA: Diagnosis not present

## 2024-01-05 DIAGNOSIS — K560 Paralytic ileus: Secondary | ICD-10-CM | POA: Diagnosis not present

## 2024-01-05 DIAGNOSIS — E1165 Type 2 diabetes mellitus with hyperglycemia: Secondary | ICD-10-CM | POA: Diagnosis not present

## 2024-01-05 DIAGNOSIS — K760 Fatty (change of) liver, not elsewhere classified: Secondary | ICD-10-CM | POA: Diagnosis not present

## 2024-01-05 DIAGNOSIS — D6869 Other thrombophilia: Secondary | ICD-10-CM | POA: Diagnosis not present

## 2024-01-05 DIAGNOSIS — I10 Essential (primary) hypertension: Secondary | ICD-10-CM | POA: Diagnosis not present

## 2024-01-05 DIAGNOSIS — M341 CR(E)ST syndrome: Secondary | ICD-10-CM | POA: Diagnosis not present

## 2024-01-05 DIAGNOSIS — M069 Rheumatoid arthritis, unspecified: Secondary | ICD-10-CM | POA: Diagnosis not present

## 2024-01-05 DIAGNOSIS — D692 Other nonthrombocytopenic purpura: Secondary | ICD-10-CM | POA: Diagnosis not present

## 2024-01-05 DIAGNOSIS — I82402 Acute embolism and thrombosis of unspecified deep veins of left lower extremity: Secondary | ICD-10-CM | POA: Diagnosis not present

## 2024-01-12 DIAGNOSIS — M069 Rheumatoid arthritis, unspecified: Secondary | ICD-10-CM | POA: Diagnosis not present

## 2024-01-12 DIAGNOSIS — M341 CR(E)ST syndrome: Secondary | ICD-10-CM | POA: Diagnosis not present

## 2024-01-12 DIAGNOSIS — Z7901 Long term (current) use of anticoagulants: Secondary | ICD-10-CM | POA: Diagnosis not present

## 2024-01-12 DIAGNOSIS — J208 Acute bronchitis due to other specified organisms: Secondary | ICD-10-CM | POA: Diagnosis not present

## 2024-01-12 DIAGNOSIS — D6869 Other thrombophilia: Secondary | ICD-10-CM | POA: Diagnosis not present

## 2024-01-12 DIAGNOSIS — I872 Venous insufficiency (chronic) (peripheral): Secondary | ICD-10-CM | POA: Diagnosis not present

## 2024-01-12 DIAGNOSIS — I10 Essential (primary) hypertension: Secondary | ICD-10-CM | POA: Diagnosis not present

## 2024-01-12 DIAGNOSIS — E1165 Type 2 diabetes mellitus with hyperglycemia: Secondary | ICD-10-CM | POA: Diagnosis not present

## 2024-01-12 DIAGNOSIS — Z23 Encounter for immunization: Secondary | ICD-10-CM | POA: Diagnosis not present

## 2024-01-12 DIAGNOSIS — Z0000 Encounter for general adult medical examination without abnormal findings: Secondary | ICD-10-CM | POA: Diagnosis not present

## 2024-01-12 DIAGNOSIS — D500 Iron deficiency anemia secondary to blood loss (chronic): Secondary | ICD-10-CM | POA: Diagnosis not present

## 2024-01-12 DIAGNOSIS — R051 Acute cough: Secondary | ICD-10-CM | POA: Diagnosis not present

## 2024-01-12 DIAGNOSIS — M349 Systemic sclerosis, unspecified: Secondary | ICD-10-CM | POA: Diagnosis not present

## 2024-01-12 DIAGNOSIS — R5383 Other fatigue: Secondary | ICD-10-CM | POA: Diagnosis not present

## 2024-01-12 DIAGNOSIS — E782 Mixed hyperlipidemia: Secondary | ICD-10-CM | POA: Diagnosis not present

## 2024-01-12 DIAGNOSIS — K2100 Gastro-esophageal reflux disease with esophagitis, without bleeding: Secondary | ICD-10-CM | POA: Diagnosis not present

## 2024-01-13 DIAGNOSIS — I110 Hypertensive heart disease with heart failure: Secondary | ICD-10-CM | POA: Diagnosis not present

## 2024-01-13 DIAGNOSIS — E1142 Type 2 diabetes mellitus with diabetic polyneuropathy: Secondary | ICD-10-CM | POA: Diagnosis not present

## 2024-01-13 DIAGNOSIS — K219 Gastro-esophageal reflux disease without esophagitis: Secondary | ICD-10-CM | POA: Diagnosis not present

## 2024-01-13 DIAGNOSIS — I2510 Atherosclerotic heart disease of native coronary artery without angina pectoris: Secondary | ICD-10-CM | POA: Diagnosis not present

## 2024-01-13 DIAGNOSIS — D6859 Other primary thrombophilia: Secondary | ICD-10-CM | POA: Diagnosis not present

## 2024-01-13 DIAGNOSIS — E1169 Type 2 diabetes mellitus with other specified complication: Secondary | ICD-10-CM | POA: Diagnosis not present

## 2024-01-13 DIAGNOSIS — E261 Secondary hyperaldosteronism: Secondary | ICD-10-CM | POA: Diagnosis not present

## 2024-01-13 DIAGNOSIS — E785 Hyperlipidemia, unspecified: Secondary | ICD-10-CM | POA: Diagnosis not present

## 2024-01-13 DIAGNOSIS — E663 Overweight: Secondary | ICD-10-CM | POA: Diagnosis not present

## 2024-01-13 DIAGNOSIS — I825Z3 Chronic embolism and thrombosis of unspecified deep veins of distal lower extremity, bilateral: Secondary | ICD-10-CM | POA: Diagnosis not present

## 2024-01-13 DIAGNOSIS — I509 Heart failure, unspecified: Secondary | ICD-10-CM | POA: Diagnosis not present

## 2024-01-13 DIAGNOSIS — D6869 Other thrombophilia: Secondary | ICD-10-CM | POA: Diagnosis not present

## 2024-01-16 ENCOUNTER — Encounter (INDEPENDENT_AMBULATORY_CARE_PROVIDER_SITE_OTHER): Payer: Self-pay | Admitting: Otolaryngology

## 2024-01-16 ENCOUNTER — Ambulatory Visit (INDEPENDENT_AMBULATORY_CARE_PROVIDER_SITE_OTHER): Payer: Medicare HMO | Admitting: Otolaryngology

## 2024-01-16 VITALS — BP 160/75 | HR 77 | Ht 68.0 in | Wt 195.0 lb

## 2024-01-16 DIAGNOSIS — K219 Gastro-esophageal reflux disease without esophagitis: Secondary | ICD-10-CM

## 2024-01-16 DIAGNOSIS — R0981 Nasal congestion: Secondary | ICD-10-CM

## 2024-01-16 DIAGNOSIS — J343 Hypertrophy of nasal turbinates: Secondary | ICD-10-CM

## 2024-01-16 DIAGNOSIS — R1310 Dysphagia, unspecified: Secondary | ICD-10-CM

## 2024-01-16 DIAGNOSIS — J3089 Other allergic rhinitis: Secondary | ICD-10-CM

## 2024-01-16 DIAGNOSIS — R040 Epistaxis: Secondary | ICD-10-CM

## 2024-01-16 DIAGNOSIS — H9202 Otalgia, left ear: Secondary | ICD-10-CM

## 2024-01-16 DIAGNOSIS — J342 Deviated nasal septum: Secondary | ICD-10-CM

## 2024-01-16 DIAGNOSIS — M26622 Arthralgia of left temporomandibular joint: Secondary | ICD-10-CM

## 2024-01-16 MED ORDER — OXYMETAZOLINE HCL 0.05 % NA SOLN: 1.0000 | Freq: Two times a day (BID) | NASAL | 0 refills | Status: AC

## 2024-01-16 MED ORDER — SALINE SPRAY 0.65 % NA SOLN: 1.0000 | NASAL | 5 refills | Status: DC | PRN
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
## 2024-01-16 NOTE — Patient Instructions (Addendum)
 Epistaxis prevention instructions given to the patient: - use nasal saline spray x6/day and Vaseline twice a day to prevent nose bleeds - for active nose bleeds use Afrin and nasal tip pressure x 10 min to stop them - if nose bleed does not stop with above measures, please go to Emergency Room  - please see your primary care provider to check your blood pressure and make sure it is under control - return for recurrent nose bleeds and we will consider cautery of your nasal blood vessels  - Purchase BleedStop to have at home and help with epistaxis control    TMJ (Temporomandibular Joint Syndrome) The temporomandibular (tem-[id number]-man-DIB-u-lur) joint (TMJ) acts like a sliding hinge, connecting your jawbone to your skull. You have one joint on each side of your jaw. TMJ disorders -- a type of temporomandibular disorder or TMD -- can cause pain in your jaw joint and in the muscles that control jaw movement.  The exact cause of a person's TMJ disorder is often difficult to determine. Your pain may be due to a combination of factors, such as genetics, arthritis or jaw injury. Some people who have jaw pain also tend to clench or grind their teeth (bruxism), although many people habitually clench or grind their teeth and never develop TMJ disorders.  In most cases, the pain and discomfort associated with TMJ disorders is temporary and can be relieved with self-managed care or nonsurgical treatments. This includes stress reduction, softer diet when the pain is present, anti-inflammatory pain medications such as Motrin and warm compresses.

## 2024-01-17 ENCOUNTER — Other Ambulatory Visit (HOSPITAL_COMMUNITY): Payer: Self-pay | Admitting: *Deleted

## 2024-01-17 DIAGNOSIS — R1310 Dysphagia, unspecified: Secondary | ICD-10-CM

## 2024-01-18 ENCOUNTER — Emergency Department (HOSPITAL_COMMUNITY)

## 2024-01-18 ENCOUNTER — Encounter (HOSPITAL_COMMUNITY): Payer: Self-pay | Admitting: Emergency Medicine

## 2024-01-18 ENCOUNTER — Inpatient Hospital Stay (HOSPITAL_COMMUNITY)
Admission: EM | Admit: 2024-01-18 | Discharge: 2024-01-24 | DRG: 390 | Disposition: A | Discharge status: Home / Self Care | Attending: Internal Medicine | Admitting: Internal Medicine

## 2024-01-18 ENCOUNTER — Other Ambulatory Visit: Payer: Self-pay

## 2024-01-18 DIAGNOSIS — Z7901 Long term (current) use of anticoagulants: Secondary | ICD-10-CM

## 2024-01-18 DIAGNOSIS — Z90710 Acquired absence of both cervix and uterus: Secondary | ICD-10-CM | POA: Diagnosis not present

## 2024-01-18 DIAGNOSIS — E785 Hyperlipidemia, unspecified: Secondary | ICD-10-CM | POA: Diagnosis not present

## 2024-01-18 DIAGNOSIS — K5669 Other partial intestinal obstruction: Secondary | ICD-10-CM | POA: Diagnosis not present

## 2024-01-18 DIAGNOSIS — Z7984 Long term (current) use of oral hypoglycemic drugs: Secondary | ICD-10-CM | POA: Diagnosis not present

## 2024-01-18 DIAGNOSIS — Z882 Allergy status to sulfonamides status: Secondary | ICD-10-CM | POA: Diagnosis not present

## 2024-01-18 DIAGNOSIS — M069 Rheumatoid arthritis, unspecified: Secondary | ICD-10-CM | POA: Diagnosis present

## 2024-01-18 DIAGNOSIS — R269 Unspecified abnormalities of gait and mobility: Secondary | ICD-10-CM

## 2024-01-18 DIAGNOSIS — D8989 Other specified disorders involving the immune mechanism, not elsewhere classified: Secondary | ICD-10-CM | POA: Diagnosis present

## 2024-01-18 DIAGNOSIS — Z96611 Presence of right artificial shoulder joint: Secondary | ICD-10-CM | POA: Diagnosis not present

## 2024-01-18 DIAGNOSIS — Z881 Allergy status to other antibiotic agents status: Secondary | ICD-10-CM

## 2024-01-18 DIAGNOSIS — E11649 Type 2 diabetes mellitus with hypoglycemia without coma: Secondary | ICD-10-CM | POA: Diagnosis not present

## 2024-01-18 DIAGNOSIS — M05741 Rheumatoid arthritis with rheumatoid factor of right hand without organ or systems involvement: Secondary | ICD-10-CM

## 2024-01-18 DIAGNOSIS — R040 Epistaxis: Secondary | ICD-10-CM | POA: Diagnosis present

## 2024-01-18 DIAGNOSIS — Z885 Allergy status to narcotic agent status: Secondary | ICD-10-CM | POA: Diagnosis not present

## 2024-01-18 DIAGNOSIS — Z86718 Personal history of other venous thrombosis and embolism: Secondary | ICD-10-CM

## 2024-01-18 DIAGNOSIS — M7989 Other specified soft tissue disorders: Secondary | ICD-10-CM | POA: Diagnosis not present

## 2024-01-18 DIAGNOSIS — Z886 Allergy status to analgesic agent status: Secondary | ICD-10-CM | POA: Diagnosis not present

## 2024-01-18 DIAGNOSIS — K219 Gastro-esophageal reflux disease without esophagitis: Secondary | ICD-10-CM | POA: Diagnosis not present

## 2024-01-18 DIAGNOSIS — Z9842 Cataract extraction status, left eye: Secondary | ICD-10-CM | POA: Diagnosis not present

## 2024-01-18 DIAGNOSIS — K56609 Unspecified intestinal obstruction, unspecified as to partial versus complete obstruction: Secondary | ICD-10-CM | POA: Diagnosis not present

## 2024-01-18 DIAGNOSIS — D509 Iron deficiency anemia, unspecified: Secondary | ICD-10-CM | POA: Diagnosis present

## 2024-01-18 DIAGNOSIS — L989 Disorder of the skin and subcutaneous tissue, unspecified: Secondary | ICD-10-CM | POA: Diagnosis not present

## 2024-01-18 DIAGNOSIS — Z961 Presence of intraocular lens: Secondary | ICD-10-CM | POA: Diagnosis present

## 2024-01-18 DIAGNOSIS — E876 Hypokalemia: Secondary | ICD-10-CM | POA: Diagnosis not present

## 2024-01-18 DIAGNOSIS — Z79899 Other long term (current) drug therapy: Secondary | ICD-10-CM | POA: Diagnosis not present

## 2024-01-18 DIAGNOSIS — Z7982 Long term (current) use of aspirin: Secondary | ICD-10-CM

## 2024-01-18 DIAGNOSIS — R1084 Generalized abdominal pain: Secondary | ICD-10-CM | POA: Diagnosis not present

## 2024-01-18 DIAGNOSIS — I10 Essential (primary) hypertension: Secondary | ICD-10-CM | POA: Diagnosis present

## 2024-01-18 DIAGNOSIS — K5900 Constipation, unspecified: Secondary | ICD-10-CM | POA: Diagnosis not present

## 2024-01-18 DIAGNOSIS — M05742 Rheumatoid arthritis with rheumatoid factor of left hand without organ or systems involvement: Secondary | ICD-10-CM

## 2024-01-18 DIAGNOSIS — E119 Type 2 diabetes mellitus without complications: Secondary | ICD-10-CM | POA: Diagnosis not present

## 2024-01-18 DIAGNOSIS — R9431 Abnormal electrocardiogram [ECG] [EKG]: Secondary | ICD-10-CM | POA: Diagnosis not present

## 2024-01-18 DIAGNOSIS — Z4682 Encounter for fitting and adjustment of non-vascular catheter: Secondary | ICD-10-CM | POA: Diagnosis not present

## 2024-01-18 LAB — URINALYSIS, ROUTINE W REFLEX MICROSCOPIC
Glucose, UA: NEGATIVE mg/dL
Hgb urine dipstick: NEGATIVE
Ketones, ur: NEGATIVE mg/dL
Leukocytes,Ua: NEGATIVE
Nitrite: NEGATIVE
Protein, ur: 100 mg/dL — AB
Specific Gravity, Urine: 1.027 (ref 1.005–1.030)
pH: 5 (ref 5.0–8.0)

## 2024-01-18 LAB — COMPREHENSIVE METABOLIC PANEL
ALT: 12 U/L (ref 0–44)
AST: 17 U/L (ref 15–41)
Albumin: 3.7 g/dL (ref 3.5–5.0)
Alkaline Phosphatase: 47 U/L (ref 38–126)
Anion gap: 13 (ref 5–15)
BUN: 6 mg/dL — ABNORMAL LOW (ref 8–23)
CO2: 21 mmol/L — ABNORMAL LOW (ref 22–32)
Calcium: 9.5 mg/dL (ref 8.9–10.3)
Chloride: 104 mmol/L (ref 98–111)
Creatinine, Ser: 0.48 mg/dL (ref 0.44–1.00)
GFR, Estimated: >60 mL/min (ref >60)
Glucose, Bld: 150 mg/dL — ABNORMAL HIGH (ref 70–99)
Potassium: 4.3 mmol/L (ref 3.5–5.1)
Sodium: 138 mmol/L (ref 135–145)
Total Bilirubin: 0.6 mg/dL (ref 0.0–1.2)
Total Protein: 7.6 g/dL (ref 6.5–8.1)

## 2024-01-18 LAB — CBC
HCT: 32.9 % — ABNORMAL LOW (ref 36.0–46.0)
Hemoglobin: 9.4 g/dL — ABNORMAL LOW (ref 12.0–15.0)
MCH: 20.5 pg — ABNORMAL LOW (ref 26.0–34.0)
MCHC: 28.6 g/dL — ABNORMAL LOW (ref 30.0–36.0)
MCV: 71.8 fL — ABNORMAL LOW (ref 80.0–100.0)
Platelets: 301 10*3/uL (ref 150–400)
RBC: 4.58 MIL/uL (ref 3.87–5.11)
RDW: 18.9 % — ABNORMAL HIGH (ref 11.5–15.5)
WBC: 9.4 10*3/uL (ref 4.0–10.5)
nRBC: 0 % (ref 0.0–0.2)

## 2024-01-18 LAB — LIPASE, BLOOD: Lipase: 36 U/L (ref 11–51)

## 2024-01-18 MED ORDER — IRBESARTAN 150 MG PO TABS: 150.0000 mg | ORAL_TABLET | Freq: Every day | ORAL | Status: DC

## 2024-01-18 MED ORDER — DULOXETINE HCL 20 MG PO CPEP: 20.0000 mg | ORAL_CAPSULE | Freq: Every morning | ORAL | Status: DC

## 2024-01-18 MED ORDER — PROCHLORPERAZINE EDISYLATE 10 MG/2ML IJ SOLN: 5.0000 mg | INTRAMUSCULAR | Status: DC | PRN

## 2024-01-18 MED ORDER — INSULIN ASPART 100 UNIT/ML IJ SOLN
0.0000 [IU] | INTRAMUSCULAR | Status: DC
  Administered 2024-01-19 (×2): 2 [IU] via SUBCUTANEOUS
  Administered 2024-01-19 – 2024-01-22 (×3): 3 [IU] via SUBCUTANEOUS

## 2024-01-18 MED ORDER — ACETAMINOPHEN 325 MG PO TABS: 650.0000 mg | ORAL_TABLET | Freq: Four times a day (QID) | ORAL | Status: DC | PRN

## 2024-01-18 MED ORDER — AMLODIPINE BESYLATE 5 MG PO TABS: 5.0000 mg | ORAL_TABLET | Freq: Every day | ORAL | Status: DC

## 2024-01-18 MED ORDER — SALINE SPRAY 0.65 % NA SOLN: 1.0000 | NASAL | Status: DC | PRN

## 2024-01-18 MED ORDER — SODIUM CHLORIDE 0.9 % IV BOLUS
500.0000 mL | Freq: Once | INTRAVENOUS | Status: AC
  Administered 2024-01-18: 500 mL via INTRAVENOUS

## 2024-01-18 MED ORDER — GABAPENTIN 300 MG PO CAPS: 600.0000 mg | ORAL_CAPSULE | Freq: Every day | ORAL | Status: DC

## 2024-01-18 MED ORDER — KETOROLAC TROMETHAMINE 30 MG/ML IJ SOLN
30.0000 mg | Freq: Once | INTRAMUSCULAR | Status: AC
Start: 2024-01-18 — End: 2024-01-18
  Administered 2024-01-18: 30 mg via INTRAVENOUS
  Filled 2024-01-18: qty 1

## 2024-01-18 MED ORDER — ROSUVASTATIN CALCIUM 5 MG PO TABS: 10.0000 mg | ORAL_TABLET | Freq: Every day | ORAL | Status: DC

## 2024-01-18 MED ORDER — ASPIRIN 81 MG PO TBEC: 81.0000 mg | DELAYED_RELEASE_TABLET | Freq: Every day | ORAL | Status: DC

## 2024-01-18 MED ORDER — DIATRIZOATE MEGLUMINE & SODIUM 66-10 % PO SOLN
90.0000 mL | Freq: Once | ORAL | Status: AC
  Administered 2024-01-19: 90 mL via NASOGASTRIC
  Filled 2024-01-18 (×2): qty 90

## 2024-01-18 MED ORDER — FENTANYL CITRATE PF 50 MCG/ML IJ SOSY
50.0000 ug | PREFILLED_SYRINGE | Freq: Once | INTRAMUSCULAR | Status: AC
  Administered 2024-01-19: 50 ug via INTRAVENOUS
  Filled 2024-01-18 (×2): qty 1

## 2024-01-18 MED ORDER — ONDANSETRON HCL 4 MG/2ML IJ SOLN
4.0000 mg | Freq: Once | INTRAMUSCULAR | Status: AC
Start: 2024-01-18 — End: 2024-01-18
  Administered 2024-01-18: 4 mg via INTRAVENOUS
  Filled 2024-01-18: qty 2

## 2024-01-18 MED ORDER — GABAPENTIN 300 MG PO CAPS: 300.0000 mg | ORAL_CAPSULE | Freq: Every morning | ORAL | Status: DC

## 2024-01-18 MED ORDER — LINACLOTIDE 145 MCG PO CAPS
290.0000 ug | ORAL_CAPSULE | Freq: Every day | ORAL | Status: DC
  Filled 2024-01-18: qty 2

## 2024-01-18 MED ORDER — ACETAMINOPHEN 650 MG RE SUPP: 650.0000 mg | Freq: Four times a day (QID) | RECTAL | Status: DC | PRN

## 2024-01-18 MED ORDER — IOHEXOL 350 MG/ML SOLN
75.0000 mL | Freq: Once | INTRAVENOUS | Status: AC | PRN
  Administered 2024-01-18: 75 mL via INTRAVENOUS

## 2024-01-18 MED ORDER — OXYMETAZOLINE HCL 0.05 % NA SOLN
1.0000 | Freq: Two times a day (BID) | NASAL | Status: AC
  Administered 2024-01-19 – 2024-01-21 (×6): 1 via NASAL
  Filled 2024-01-18: qty 30

## 2024-01-18 NOTE — ED Provider Notes (Signed)
 Update, general surgeon did get in touch me to request now that we attempt a left nostril NG tube the patient's stomach appears quite distended on CT scan and she has belching and vomiting.  He feels this may relieve her symptoms and help decompress the proximal bowel.  The patient does have a noted history of recurrent epistaxis predominantly in the right nostril, and so we will attempt a left nostril NG placement.  RN instructed for placement.   Terald Sleeper, MD 01/18/24 [phone]

## 2024-01-18 NOTE — H&P (Incomplete)
 PCP:   Georgianne Fick, MD   Chief Complaint:  Abdominal pain  HPI: This is a 78 year old female with past medical history significant for HTN, T2DM, prolonged QTc.  2 nights ago she developed generalized abdominal pain, which did not go away.  Per patient she has not been able to sleep the last 2 nights because of it.  She has had lots of nausea and vomiting.  Last emesis at 6 PM.  She finally came to the ER.  In the ER presenting vitals stable.  CT abdomen pelvis shows SBO with transition point.  Surgeon has been consulted.  Review of Systems:  Per HPI  Past Medical History: Past Medical History:  Diagnosis Date   Abnormal nuclear stress test 10/18/2013   Arm pain 10/18/2013   Arthritis    Cataract    RIGHT EYE   Chest pain 10/18/2013   Diabetes mellitus    dx over 5 yrs ago   GERD (gastroesophageal reflux disease)    Heart murmur    "yrs ago in [place] -- been here since 1999, "   History of hiatal hernia    Hypertension    Prolonged Q-T interval on ECG 04/20/2017   Pulmonary edema 04/20/2017   Past Surgical History:  Procedure Laterality Date   ABDOMINAL HYSTERECTOMY     BREAST EXCISIONAL BIOPSY     BREAST SURGERY     biopsy for cystic breasts   burns     with cooking oil yrs ago   CATARACT EXTRACTION W/ INTRAOCULAR LENS IMPLANT Left 2017   CHOLECYSTECTOMY     COLONOSCOPY     COLONOSCOPY WITH PROPOFOL N/A 11/10/2021   Procedure: COLONOSCOPY WITH PROPOFOL;  Surgeon: Jeani Hawking, MD;  Location: [facility] ENDOSCOPY;  Service: Endoscopy;  Laterality: N/A;   DILATION AND CURETTAGE OF UTERUS     ESOPHAGOGASTRODUODENOSCOPY ENDOSCOPY     EYE SURGERY Right    left eye has new lens   HERNIA REPAIR     INSERTION OF MESH N/A 11/24/2017   Procedure: INSERTION OF MESH;  Surgeon: Manus Rudd, MD;  Location: [facility] OR;  Service: General;  Laterality: N/A;   LEFT HEART CATH AND CORONARY ANGIOGRAPHY N/A 04/21/2017   Procedure: Left Heart Cath and Coronary Angiography;  Surgeon:  Runell Gess, MD;  Location: [facility] INVASIVE CV LAB;  Service: Cardiovascular;  Laterality: N/A;   PERIPHERAL VASCULAR THROMBECTOMY Left 02/06/2021   Procedure: PERIPHERAL VASCULAR THROMBECTOMY;  Surgeon: Sherren Kerns, MD;  Location: [facility] INVASIVE CV LAB;  Service: Cardiovascular;  Laterality: Left;   PERIPHERAL VASCULAR THROMBECTOMY N/A 06/01/2021   Procedure: PERIPHERAL VASCULAR THROMBECTOMY;  Surgeon: Maeola Harman, MD;  Location: [facility] INVASIVE CV LAB;  Service: Cardiovascular;  Laterality: N/A;   POLYPECTOMY  11/10/2021   Procedure: POLYPECTOMY;  Surgeon: Jeani Hawking, MD;  Location: [facility] ENDOSCOPY;  Service: Endoscopy;;   REVERSE SHOULDER ARTHROPLASTY Right 06/02/2018   REVERSE SHOULDER ARTHROPLASTY Right 06/02/2018   Procedure: REVERSE SHOULDER ARTHROPLASTY;  Surgeon: Beverely Low, MD;  Location[facility] OR;  Service: Orthopedics;  Laterality: Right;   SHOULDER ARTHROSCOPY WITH SUBACROMIAL DECOMPRESSION AND OPEN ROTATOR C Right 03/11/2017   Procedure: RIGHT SHOULDER ARTHROSCOPY, subacromial decompression, MINI-OPEN rotator cuff repair, OPEN distal clavicle resection;  Surgeon: Beverely Low, MD;  Location: [facility] OR;  Service: Orthopedics;  Laterality: Right;   UMBILICAL HERNIA REPAIR N/A 11/24/2017   Procedure: UMBILICAL HERINA REPAIR;  Surgeon: Manus Rudd, MD;  Location: [facility] OR;  Service: General;  Laterality: N/A;    Medications: Prior  to Admission medications   Medication Sig Start Date End Date Taking? Authorizing Provider  acetaminophen (TYLENOL) 500 MG tablet Take 1,000 mg by mouth every 6 (six) hours as needed for moderate pain or headache.    [provider]  amLODipine (NORVASC) 5 MG tablet Take 1 tablet (5 mg total) by mouth daily. 09/13/23   Charlynne Pander, MD  apixaban (ELIQUIS) 5 MG TABS tablet Take 5 mg by mouth 2 (two) times daily.    [provider]  aspirin EC 81 MG tablet Take 81 mg by mouth daily. Swallow whole.    [provider]   benzonatate (TESSALON) 100 MG capsule Take 1 capsule (100 mg total) by mouth 3 (three) times daily as needed for cough. 06/24/23   Rising, Lurena Joiner, PA-C  bethanechol (URECHOLINE) 5 MG tablet Take 1 tablet (5 mg total) by mouth 3 (three) times daily. 12/25/21   Alwyn Ren, MD  bisacodyl (DULCOLAX) 10 MG suppository Place 1 suppository (10 mg total) rectally as needed for moderate constipation. 09/17/23   Sabas Sous, MD  docusate sodium (COLACE) 100 MG capsule Take 2 capsules (200 mg total) by mouth 3 (three) times daily. 12/25/21   Alwyn Ren, MD  DULoxetine (CYMBALTA) 20 MG capsule Take 20 mg by mouth every morning. 04/30/21   [provider]  folic acid (FOLVITE) 1 MG tablet Take 1 tablet (1 mg total) by mouth daily. 02/26/21   Medina-Vargas, Monina C, NP  furosemide (LASIX) 20 MG tablet Take 20 mg by mouth daily. 09/10/21   [provider]  gabapentin (NEURONTIN) 300 MG capsule Take 300 mg in the morning and 600 mg at night.  Take additional 300 mg as needed for severe pain 11/29/22   [provider]  glycerin adult 2 g suppository Place 1 suppository rectally as needed for constipation. 09/17/23   Sabas Sous, MD  linaclotide Karlene Einstein) 290 MCG CAPS capsule Take 1 capsule (290 mcg total) by mouth daily before breakfast. 12/25/21   Alwyn Ren, MD  metFORMIN (GLUCOPHAGE-XR) 500 MG 24 hr tablet Take 1,000 mg by mouth daily with breakfast.    [provider]  methotrexate (RHEUMATREX) 2.5 MG tablet Take 8 tablets (20 mg total) by mouth once a week. 8 tablets weekly (Sunday) Patient taking differently: Take 20 mg by mouth every Sunday. 8 tabs 02/26/21   Medina-Vargas, Monina C, NP  oxymetazoline (AFRIN NASAL SPRAY) 0.05 % nasal spray Place 1 spray into both nostrils 2 (two) times daily. 01/16/24   Ashok Croon, MD  polyethylene glycol (MIRALAX / GLYCOLAX) 17 g packet Take 17 g by mouth 3 (three) times daily. 12/25/21   Alwyn Ren, MD  rosuvastatin (CRESTOR) 10 MG tablet Take 1 tablet (10 mg total) by mouth daily. 02/26/21   Medina-Vargas, Monina C, NP  sodium chloride (OCEAN) 0.65 % SOLN nasal spray Place 1 spray into both nostrils as needed for congestion. 01/16/24   Ashok Croon, MD  telmisartan (MICARDIS) 80 MG tablet Take 80 mg by mouth at bedtime. 10/12/21   [provider]    Allergies:   Allergies  Allergen Reactions   Cephalexin Other (See Comments)    URINARY RETENTION = "blocked my urine"   Tapentadol     Other reaction(s): zombie Other reaction(s): zombie   Codeine Hives and Rash   Grass Pollen(K-O-R-T-Swt Vern) Rash   Nucynta [Tapentadol Hcl] Other (See Comments)    Altered mental status    Social History:  reports  that she has never smoked. She has never been exposed to tobacco smoke. She has never used smokeless tobacco. She reports that she does not drink alcohol and does not use drugs.  Family History: Family History  Family history unknown: Yes    Physical Exam: Vitals:   01/18/24 1046 01/18/24 1048 01/18/24 1712 01/18/24 2305  BP:  (!) 144/71 138/70 (!) 177/81  Pulse:  78 93 97  Resp:  16 18 14   Temp:  98.8 F (37.1 C) 98.8 F (37.1 C) 98.7 F (37.1 C)  TempSrc:   Oral Oral  SpO2:  99% 99% 97%  Weight: 88 kg     Height: 5\' 8"  (1.727 m)       General:  A&O x3, well developed and nourished, currently in no distress Eyes: Pink conjunctiva, no scleral icterus ENT: Moist oral mucosa Lungs: CTA B/L, no wheeze, no crackles, no use of accessory muscles Cardiovascular: RRR, no bruits, no JVD Abdomen: soft, normal BS, positive TTP GU: not examined Neuro: CN II - XII grossly intact, sensation intact Musculoskeletal: strength 5/5 all extremities, no edema Skin: no rash, no subcutaneous crepitation, no decubitus Psych: appropriate patient   Labs on Admission:  Recent Labs    01/18/24 1055  NA 138  K 4.3  CL 104  CO2 21*  GLUCOSE 150*  BUN 6*   CREATININE 0.48  CALCIUM 9.5   Recent Labs    01/18/24 1055  AST 17  ALT 12  ALKPHOS 47  BILITOT 0.6  PROT 7.6  ALBUMIN 3.7   Recent Labs    01/18/24 1055  LIPASE 36   Recent Labs    01/18/24 1055  WBC 9.4  HGB 9.4*  HCT 32.9*  MCV 71.8*  PLT 301    Radiological Exams on Admission: CT ABDOMEN PELVIS W CONTRAST Result Date: 01/18/2024 CLINICAL DATA:  Generalized abdominal pain and cramping, nausea and vomiting EXAM: CT ABDOMEN AND PELVIS WITH CONTRAST TECHNIQUE: Multidetector CT imaging of the abdomen and pelvis was performed using the standard protocol following bolus administration of intravenous contrast. RADIATION DOSE REDUCTION: This exam was performed according to the departmental dose-optimization program which includes automated exposure control, adjustment of the mA and/or kV according to patient size and/or use of iterative reconstruction technique. CONTRAST:  75mL OMNIPAQUE IOHEXOL 350 MG/ML SOLN COMPARISON:  09/17/2023 FINDINGS: Lower chest: No acute pleural or parenchymal lung disease. Hepatobiliary: No focal liver abnormality is seen. Status post cholecystectomy. No biliary dilatation. Pancreas: Unremarkable. No pancreatic ductal dilatation or surrounding inflammatory changes. Spleen: Normal in size without focal abnormality. Adrenals/Urinary Tract: Adrenal glands are unremarkable. Kidneys are normal, without renal calculi, focal lesion, or hydronephrosis. Bladder is unremarkable. Stomach/Bowel: There is a large amount of retained stool throughout the colon consistent with constipation. Increased fecal burden since prior study. There is prominent dilation of the mid to distal jejunum, with fecalization within the distended small bowel loops which measure up to 5 cm in diameter. There is a transition point within the central abdomen, reference image 62/3, with normal caliber of the ileum beyond this point. Findings are concerning for early or intermittent small bowel  obstruction. No bowel wall thickening or inflammatory change.  Normal appendix. Vascular/Lymphatic: Aortic atherosclerosis. Stable aortic atherosclerosis. Stable stent extending from the left common iliac through the left external iliac vein. No pathologic adenopathy. Reproductive: Status post hysterectomy. No adnexal masses. Other: Trace pelvic free fluid. No free intraperitoneal gas. No abdominal wall hernia. Musculoskeletal: No acute or destructive bony abnormalities. Reconstructed images  demonstrate no additional findings. IMPRESSION: 1. Distended small bowel extending from the proximal through the distal jejunum, with formed stool seen within the distended distal jejunum. Transition point within the central abdomen with decompressed terminal ileum beyond this point, compatible with small-bowel obstruction. 2. Large amount of retained stool throughout the colon, increased since prior study, consistent with constipation. 3. Trace pelvic free fluid. 4.  Aortic Atherosclerosis (ICD10-I70.0). Electronically Signed   By: Sharlet Salina M.D.   On: 01/18/2024 22:28    Assessment/Plan Present on Admission:  SBO (small bowel obstruction) (HCC) -NG tube placed right naris.  [Patient with recurrent epistaxis left nares] -Pain meds as needed, antiemetics as needed -IV fluid hydration -Surgeon Dr. Derrell Lolling seen patient -Patient on Eliquis, will hold.  Initiate heparin drip -N.p.o. IV Protonix ordered   Recurrent epistaxis -Has appt w/ ENT next week for intervention   History of DVT -Eliquis on hold.  Heparin drip initiated -Patient maintained on Eliquis on hold   Hypertension -As needed hydralazine ordered   HTN //  T2DM -Sliding scale insulin ordered -All PO. medications on hold   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HCC) //  Rheumatoid arthritis (HCC) -Methotrexate, folic acid on hold -Opioid medications on hold   Prolonged Q-T interval on ECG  Diane [name] 01/18/2024, 11:28 PM

## 2024-01-18 NOTE — Consult Note (Signed)
 Reason for Consult: SBO Referring Physician: Dr. Mertie Clause Diane [name] is an 78 y.o. female.  HPI: 78 year old female with 1 day history of abdominal pain and bloating.  Patient states this was associate with nausea vomiting.  She states that she has had multiple occurrences of this in the past which required hospitalizations.  Patient states that she has been having bowel movements with last 1 being yesterday morning.  States this was fairly normal.  Patient underwent CT scan per EDP.  Patient was found to have a large stool burden, with transition point in the central abdomen.  Of note patient does state that she has had multiple abdominal surgeries to include lap chole's, hernia repairs both inguinal and ventral hernias.  General surgery was consulted for further evaluation and management.    Past Medical History:  Diagnosis Date   Abnormal nuclear stress test 10/18/2013   Arm pain 10/18/2013   Arthritis    Cataract    RIGHT EYE   Chest pain 10/18/2013   Diabetes mellitus    dx over 5 yrs ago   GERD (gastroesophageal reflux disease)    Heart murmur    "yrs ago in [place] -- been here since 1999, "   History of hiatal hernia    Hypertension    Prolonged Q-T interval on ECG 04/20/2017   Pulmonary edema 04/20/2017    Past Surgical History:  Procedure Laterality Date   ABDOMINAL HYSTERECTOMY     BREAST EXCISIONAL BIOPSY     BREAST SURGERY     biopsy for cystic breasts   burns     with cooking oil yrs ago   CATARACT EXTRACTION W/ INTRAOCULAR LENS IMPLANT Left 2017   CHOLECYSTECTOMY     COLONOSCOPY     COLONOSCOPY WITH PROPOFOL N/A 11/10/2021   Procedure: COLONOSCOPY WITH PROPOFOL;  Surgeon: Jeani Hawking, MD;  Location: [facility] ENDOSCOPY;  Service: Endoscopy;  Laterality: N/A;   DILATION AND CURETTAGE OF UTERUS     ESOPHAGOGASTRODUODENOSCOPY ENDOSCOPY     EYE SURGERY Right    left eye has new lens   HERNIA REPAIR     INSERTION OF MESH N/A 11/24/2017   Procedure: INSERTION  OF MESH;  Surgeon: Manus Rudd, MD;  Location: [facility] OR;  Service: General;  Laterality: N/A;   LEFT HEART CATH AND CORONARY ANGIOGRAPHY N/A 04/21/2017   Procedure: Left Heart Cath and Coronary Angiography;  Surgeon: Runell Gess, MD;  Location: [facility] INVASIVE CV LAB;  Service: Cardiovascular;  Laterality: N/A;   PERIPHERAL VASCULAR THROMBECTOMY Left 02/06/2021   Procedure: PERIPHERAL VASCULAR THROMBECTOMY;  Surgeon: Sherren Kerns, MD;  Location: [facility] INVASIVE CV LAB;  Service: Cardiovascular;  Laterality: Left;   PERIPHERAL VASCULAR THROMBECTOMY N/A 06/01/2021   Procedure: PERIPHERAL VASCULAR THROMBECTOMY;  Surgeon: Maeola Harman, MD;  Location: [facility] INVASIVE CV LAB;  Service: Cardiovascular;  Laterality: N/A;   POLYPECTOMY  11/10/2021   Procedure: POLYPECTOMY;  Surgeon: Jeani Hawking, MD;  Location: [facility] ENDOSCOPY;  Service: Endoscopy;;   REVERSE SHOULDER ARTHROPLASTY Right 06/02/2018   REVERSE SHOULDER ARTHROPLASTY Right 06/02/2018   Procedure: REVERSE SHOULDER ARTHROPLASTY;  Surgeon: Beverely Low, MD;  Location: [facility] OR;  Service: Orthopedics;  Laterality: Right;   SHOULDER ARTHROSCOPY WITH SUBACROMIAL DECOMPRESSION AND OPEN ROTATOR C Right 03/11/2017   Procedure: RIGHT SHOULDER ARTHROSCOPY, subacromial decompression, MINI-OPEN rotator cuff repair, OPEN distal clavicle resection;  Surgeon: Beverely Low, MD;  Location: [facility] OR;  Service: Orthopedics;  Laterality: Right;   UMBILICAL HERNIA REPAIR N/A 11/24/2017  Procedure: UMBILICAL HERINA REPAIR;  Surgeon: Manus Rudd, MD;  Location: [facility] OR;  Service: General;  Laterality: N/A;    Family History  Family history unknown: Yes    Social History:  reports that she has never smoked. She has never been exposed to tobacco smoke. She has never used smokeless tobacco. She reports that she does not drink alcohol and does not use drugs.  Allergies:  Allergies  Allergen Reactions   Cephalexin Other (See Comments)    URINARY RETENTION = "blocked  my urine"   Tapentadol     Other reaction(s): zombie Other reaction(s): zombie   Codeine Hives and Rash   Grass Pollen(K-O-R-T-Swt Vern) Rash   Nucynta [Tapentadol Hcl] Other (See Comments)    Altered mental status    Medications: I have reviewed the patient's current medications.  Results for orders placed or performed during the hospital encounter of 01/18/24 (from the past 48 hours)  Lipase, blood     Status: None   Collection Time: 01/18/24 10:55 AM  Result Value Ref Range   Lipase 36 11 - 51 U/L    Comment: Performed at [facility], [address] [place], [place] [address]  Comprehensive metabolic panel     Status: Abnormal   Collection Time: 01/18/24 10:55 AM  Result Value Ref Range   Sodium 138 135 - 145 mmol/L   Potassium 4.3 3.5 - 5.1 mmol/L   Chloride 104 98 - 111 mmol/L   CO2 21 (L) 22 - 32 mmol/L   Glucose, Bld 150 (H) 70 - 99 mg/dL    Comment: Glucose reference range applies only to samples taken after fasting for at least 8 hours.   BUN 6 (L) 8 - 23 mg/dL   Creatinine, Ser 9.62 0.44 - 1.00 mg/dL   Calcium 9.5 8.9 - 95.2 mg/dL   Total Protein 7.6 6.5 - 8.1 g/dL   Albumin 3.7 3.5 - 5.0 g/dL   AST 17 15 - 41 U/L   ALT 12 0 - 44 U/L   Alkaline Phosphatase 47 38 - 126 U/L   Total Bilirubin 0.6 0.0 - 1.2 mg/dL   GFR, Estimated >84 >13 mL/min    Comment: (NOTE) Calculated using the CKD-EPI Creatinine Equation (2021)    Anion gap 13 5 - 15    Comment: Performed at [facility], [address], [place], [place] [address]  CBC     Status: Abnormal   Collection Time: 01/18/24 10:55 AM  Result Value Ref Range   WBC 9.4 4.0 - 10.5 K/uL   RBC 4.58 3.87 - 5.11 MIL/uL   Hemoglobin 9.4 (L) 12.0 - 15.0 g/dL   HCT 02.7 (L) 25.3 - 66.4 %   MCV 71.8 (L) 80.0 - 100.0 fL   MCH 20.5 (L) 26.0 - 34.0 pg   MCHC 28.6 (L) 30.0 - 36.0 g/dL   RDW 40.3 (H) 47.4 - 25.9 %   Platelets 301 150 - 400 K/uL   nRBC 0.0 0.0 - 0.2 %    Comment: Performed at [facility], [address] [place], [place] [address]  Urinalysis, Routine w reflex microscopic -Urine, Clean Catch     Status: Abnormal   Collection Time: 01/18/24  3:45 PM  Result Value Ref Range   Color, Urine AMBER (A) YELLOW    Comment: BIOCHEMICALS MAY BE AFFECTED BY COLOR   APPearance HAZY (A) CLEAR   Specific Gravity, Urine 1.027 1.005 - 1.030   pH 5.0 5.0 -  8.0   Glucose, UA NEGATIVE NEGATIVE mg/dL   Hgb urine dipstick NEGATIVE NEGATIVE   Bilirubin Urine SMALL (A) NEGATIVE   Ketones, ur NEGATIVE NEGATIVE mg/dL   Protein, ur 952 (A) NEGATIVE mg/dL   Nitrite NEGATIVE NEGATIVE   Leukocytes,Ua NEGATIVE NEGATIVE   RBC / HPF 0-5 0 - 5 RBC/hpf   WBC, UA 6-10 0 - 5 WBC/hpf   Bacteria, UA RARE (A) NONE SEEN   Squamous Epithelial / HPF 6-10 0 - 5 /HPF   Mucus PRESENT     Comment: Performed at [facility], [address], [place], [place] [address]    CT ABDOMEN PELVIS W CONTRAST Result Date: 01/18/2024 CLINICAL DATA:  Generalized abdominal pain and cramping, nausea and vomiting EXAM: CT ABDOMEN AND PELVIS WITH CONTRAST TECHNIQUE: Multidetector CT imaging of the abdomen and pelvis was performed using the standard protocol following bolus administration of intravenous contrast. RADIATION DOSE REDUCTION: This exam was performed according to the departmental dose-optimization program which includes automated exposure control, adjustment of the mA and/or kV according to patient size and/or use of iterative reconstruction technique. CONTRAST:  75mL OMNIPAQUE IOHEXOL 350 MG/ML SOLN COMPARISON:  09/17/2023 FINDINGS: Lower chest: No acute pleural or parenchymal lung disease. Hepatobiliary: No focal liver abnormality is seen. Status post cholecystectomy. No biliary dilatation. Pancreas: Unremarkable. No pancreatic ductal dilatation or surrounding inflammatory changes. Spleen: Normal in size without focal abnormality. Adrenals/Urinary Tract: Adrenal glands are unremarkable. Kidneys are normal,  without renal calculi, focal lesion, or hydronephrosis. Bladder is unremarkable. Stomach/Bowel: There is a large amount of retained stool throughout the colon consistent with constipation. Increased fecal burden since prior study. There is prominent dilation of the mid to distal jejunum, with fecalization within the distended small bowel loops which measure up to 5 cm in diameter. There is a transition point within the central abdomen, reference image 62/3, with normal caliber of the ileum beyond this point. Findings are concerning for early or intermittent small bowel obstruction. No bowel wall thickening or inflammatory change.  Normal appendix. Vascular/Lymphatic: Aortic atherosclerosis. Stable aortic atherosclerosis. Stable stent extending from the left common iliac through the left external iliac vein. No pathologic adenopathy. Reproductive: Status post hysterectomy. No adnexal masses. Other: Trace pelvic free fluid. No free intraperitoneal gas. No abdominal wall hernia. Musculoskeletal: No acute or destructive bony abnormalities. Reconstructed images demonstrate no additional findings. IMPRESSION: 1. Distended small bowel extending from the proximal through the distal jejunum, with formed stool seen within the distended distal jejunum. Transition point within the central abdomen with decompressed terminal ileum beyond this point, compatible with small-bowel obstruction. 2. Large amount of retained stool throughout the colon, increased since prior study, consistent with constipation. 3. Trace pelvic free fluid. 4.  Aortic Atherosclerosis (ICD10-I70.0). Electronically Signed   By: Sharlet Salina M.D.   On: 01/18/2024 22:28    Review of Systems  Constitutional:  Negative for chills and fever.  HENT:  Negative for ear discharge, hearing loss and sore throat.   Eyes:  Negative for discharge.  Respiratory:  Negative for cough and shortness of breath.   Cardiovascular:  Negative for chest pain and leg  swelling.  Gastrointestinal:  Positive for abdominal distention, abdominal pain, nausea and vomiting. Negative for constipation and diarrhea.  Musculoskeletal:  Negative for myalgias and neck pain.  Skin:  Negative for rash.  Allergic/Immunologic: Negative for environmental allergies.  Neurological:  Negative for dizziness and seizures.  Hematological:  Does not bruise/bleed easily.  Psychiatric/Behavioral:  Negative for suicidal ideas.  All other systems reviewed and are negative.  Blood pressure (!) 177/81, pulse 97, temperature 98.7 F (37.1 C), temperature source Oral, resp. rate 14, height 5\' 8"  (1.727 m), weight 88 kg, SpO2 97%. Physical Exam Constitutional:      Appearance: She is well-developed.     Comments: Conversant No acute distress  HENT:     Head: Normocephalic and atraumatic.  Eyes:     General: Lids are normal. No scleral icterus.    Pupils: Pupils are equal, round, and reactive to light.     Comments: Pupils are equal round and reactive No lid lag Moist conjunctiva  Neck:     Thyroid: No thyromegaly.     Trachea: No tracheal tenderness.     Comments: No cervical lymphadenopathy Cardiovascular:     Rate and Rhythm: Normal rate and regular rhythm.     Heart sounds: No murmur heard. Pulmonary:     Effort: Pulmonary effort is normal.     Breath sounds: Normal breath sounds. No wheezing or rales.  Abdominal:     General: There is distension.     Tenderness: There is generalized abdominal tenderness.     Hernia: No hernia is present.  Musculoskeletal:     Cervical back: Normal range of motion and neck supple.  Skin:    General: Skin is warm.     Findings: No rash.     Nails: There is no clubbing.     Comments: Normal skin turgor  Neurological:     Mental Status: She is alert and oriented to person, place, and time.     Comments: Normal gait and station  Psychiatric:        Mood and Affect: Mood normal.        Thought Content: Thought content normal.         Judgment: Judgment normal.     Comments: Appropriate affect     Assessment/Plan: 78 year old female with SBO History of hypertension Rheumatoid arthritis Diabetes  1.  Admit to medicine 2.  Begin SBO protocol, NG tube. 3.  Will follow along.  Axel Filler 01/18/2024, 11:46 PM

## 2024-01-18 NOTE — ED Provider Notes (Signed)
 [place] [facility]ERGENCY DEPARTMENT AT [facility] Provider Note   CSN: [id number] Arrival date & time: 01/18/24  1013     History  Chief Complaint  Patient presents with   Abdominal Pain    Diane [name] is a 78 y.o. female with a history of bowel obstruction, cholecystectomy, presented to ED with nausea vomiting and abdominal pain.  Patient reports his symptoms began within the past 24 hours.  She says she is moving her bowels regularly and has not been constipated or having any significant diarrhea.  She reports she has had persistent vomiting and cramping diffuse abdominal pain as well as abdominal distention.  She is here with her daughter at the bedside.  HPI     Home Medications Prior to Admission medications   Medication Sig Start Date End Date Taking? Authorizing Provider  acetaminophen (TYLENOL) 500 MG tablet Take 1,000 mg by mouth every 6 (six) hours as needed for moderate pain or headache.    [provider]  amLODipine (NORVASC) 5 MG tablet Take 1 tablet (5 mg total) by mouth daily. 09/13/23   Charlynne Pander, MD  apixaban (ELIQUIS) 5 MG TABS tablet Take 5 mg by mouth 2 (two) times daily.    [provider]  aspirin EC 81 MG tablet Take 81 mg by mouth daily. Swallow whole.    [provider]  benzonatate (TESSALON) 100 MG capsule Take 1 capsule (100 mg total) by mouth 3 (three) times daily as needed for cough. 06/24/23   Rising, Lurena Joiner, PA-C  bethanechol (URECHOLINE) 5 MG tablet Take 1 tablet (5 mg total) by mouth 3 (three) times daily. 12/25/21   Alwyn Ren, MD  bisacodyl (DULCOLAX) 10 MG suppository Place 1 suppository (10 mg total) rectally as needed for moderate constipation. 09/17/23   Sabas Sous, MD  docusate sodium (COLACE) 100 MG capsule Take 2 capsules (200 mg total) by mouth 3 (three) times daily. 12/25/21   Alwyn Ren, MD  DULoxetine (CYMBALTA) 20 MG capsule Take 20 mg by mouth every morning. 04/30/21    [provider]  folic acid (FOLVITE) 1 MG tablet Take 1 tablet (1 mg total) by mouth daily. 02/26/21   Medina-Vargas, Monina C, NP  furosemide (LASIX) 20 MG tablet Take 20 mg by mouth daily. 09/10/21   [provider]  gabapentin (NEURONTIN) 300 MG capsule Take 300 mg in the morning and 600 mg at night.  Take additional 300 mg as needed for severe pain 11/29/22   [provider]  glycerin adult 2 g suppository Place 1 suppository rectally as needed for constipation. 09/17/23   Sabas Sous, MD  linaclotide Karlene Einstein) 290 MCG CAPS capsule Take 1 capsule (290 mcg total) by mouth daily before breakfast. 12/25/21   Alwyn Ren, MD  metFORMIN (GLUCOPHAGE-XR) 500 MG 24 hr tablet Take 1,000 mg by mouth daily with breakfast.    [provider]  methotrexate (RHEUMATREX) 2.5 MG tablet Take 8 tablets (20 mg total) by mouth once a week. 8 tablets weekly (Sunday) Patient taking differently: Take 20 mg by mouth every Sunday. 8 tabs 02/26/21   Medina-Vargas, Monina C, NP  oxymetazoline (AFRIN NASAL SPRAY) 0.05 % nasal spray Place 1 spray into both nostrils 2 (two) times daily. 01/16/24   Ashok Croon, MD  polyethylene glycol (MIRALAX / GLYCOLAX) 17 g packet Take 17 g by mouth 3 (three) times daily. 12/25/21   Alwyn Ren, MD  rosuvastatin (CRESTOR) 10 MG tablet Take  1 tablet (10 mg total) by mouth daily. 02/26/21   Medina-Vargas, Monina C, NP  sodium chloride (OCEAN) 0.65 % SOLN nasal spray Place 1 spray into both nostrils as needed for congestion. 01/16/24   Ashok Croon, MD  telmisartan (MICARDIS) 80 MG tablet Take 80 mg by mouth at bedtime. 10/12/21   [provider]      Allergies    Cephalexin, Tapentadol, Codeine, Grass pollen(k-o-r-t-swt vern), and Nucynta [tapentadol hcl]    Review of Systems   Review of Systems  Physical Exam Updated Vital Signs BP (!) 177/81 (BP Location: Right Wrist)   Pulse 97   Temp 98.7 F (37.1 C) (Oral)    Resp 14   Ht 5\' 8"  (1.727 m)   Wt 88 kg   SpO2 97%   BMI 29.50 kg/m  Physical Exam Constitutional:      General: She is not in acute distress. HENT:     Head: Normocephalic and atraumatic.  Eyes:     Conjunctiva/sclera: Conjunctivae normal.     Pupils: Pupils are equal, round, and reactive to light.  Cardiovascular:     Rate and Rhythm: Normal rate and regular rhythm.  Pulmonary:     Effort: Pulmonary effort is normal. No respiratory distress.  Abdominal:     General: There is distension.     Tenderness: There is generalized abdominal tenderness.  Skin:    General: Skin is warm and dry.  Neurological:     General: No focal deficit present.     Mental Status: She is alert. Mental status is at baseline.  Psychiatric:        Mood and Affect: Mood normal.        Behavior: Behavior normal.     ED Results / Procedures / Treatments   Labs (all labs ordered are listed, but only abnormal results are displayed) Labs Reviewed  COMPREHENSIVE METABOLIC PANEL - Abnormal; Notable for the following components:      Result Value   CO2 21 (*)    Glucose, Bld 150 (*)    BUN 6 (*)    All other components within normal limits  CBC - Abnormal; Notable for the following components:   Hemoglobin 9.4 (*)    HCT 32.9 (*)    MCV 71.8 (*)    MCH 20.5 (*)    MCHC 28.6 (*)    RDW 18.9 (*)    All other components within normal limits  URINALYSIS, ROUTINE W REFLEX MICROSCOPIC - Abnormal; Notable for the following components:   Color, Urine AMBER (*)    APPearance HAZY (*)    Bilirubin Urine SMALL (*)    Protein, ur 100 (*)    Bacteria, UA RARE (*)    All other components within normal limits  LIPASE, BLOOD    EKG None  Radiology CT ABDOMEN PELVIS W CONTRAST Result Date: 01/18/2024 CLINICAL DATA:  Generalized abdominal pain and cramping, nausea and vomiting EXAM: CT ABDOMEN AND PELVIS WITH CONTRAST TECHNIQUE: Multidetector CT imaging of the abdomen and pelvis was performed using the  standard protocol following bolus administration of intravenous contrast. RADIATION DOSE REDUCTION: This exam was performed according to the departmental dose-optimization program which includes automated exposure control, adjustment of the mA and/or kV according to patient size and/or use of iterative reconstruction technique. CONTRAST:  75mL OMNIPAQUE IOHEXOL 350 MG/ML SOLN COMPARISON:  09/17/2023 FINDINGS: Lower chest: No acute pleural or parenchymal lung disease. Hepatobiliary: No focal liver abnormality is seen. Status post cholecystectomy. No biliary dilatation.  Pancreas: Unremarkable. No pancreatic ductal dilatation or surrounding inflammatory changes. Spleen: Normal in size without focal abnormality. Adrenals/Urinary Tract: Adrenal glands are unremarkable. Kidneys are normal, without renal calculi, focal lesion, or hydronephrosis. Bladder is unremarkable. Stomach/Bowel: There is a large amount of retained stool throughout the colon consistent with constipation. Increased fecal burden since prior study. There is prominent dilation of the mid to distal jejunum, with fecalization within the distended small bowel loops which measure up to 5 cm in diameter. There is a transition point within the central abdomen, reference image 62/3, with normal caliber of the ileum beyond this point. Findings are concerning for early or intermittent small bowel obstruction. No bowel wall thickening or inflammatory change.  Normal appendix. Vascular/Lymphatic: Aortic atherosclerosis. Stable aortic atherosclerosis. Stable stent extending from the left common iliac through the left external iliac vein. No pathologic adenopathy. Reproductive: Status post hysterectomy. No adnexal masses. Other: Trace pelvic free fluid. No free intraperitoneal gas. No abdominal wall hernia. Musculoskeletal: No acute or destructive bony abnormalities. Reconstructed images demonstrate no additional findings. IMPRESSION: 1. Distended small bowel  extending from the proximal through the distal jejunum, with formed stool seen within the distended distal jejunum. Transition point within the central abdomen with decompressed terminal ileum beyond this point, compatible with small-bowel obstruction. 2. Large amount of retained stool throughout the colon, increased since prior study, consistent with constipation. 3. Trace pelvic free fluid. 4.  Aortic Atherosclerosis (ICD10-I70.0). Electronically Signed   By: Sharlet Salina M.D.   On: 01/18/2024 22:28    Procedures Procedures    Medications Ordered in ED Medications  diatrizoate meglumine-sodium (GASTROGRAFIN) 66-10 % solution 90 mL (has no administration in time range)  ondansetron (ZOFRAN) injection 4 mg (4 mg Intravenous Given 01/18/24 2236)  ketorolac (TORADOL) 30 MG/ML injection 30 mg (30 mg Intravenous Given 01/18/24 2237)  sodium chloride 0.9 % bolus 500 mL (500 mLs Intravenous New Bag/Given 01/18/24 2235)  iohexol (OMNIPAQUE) 350 MG/ML injection 75 mL (75 mLs Intravenous Contrast Given 01/18/24 2218)    ED Course/ Medical Decision Making/ A&P Clinical Course as of 01/18/24 2319  Wed Jan 18, 2024  2303 Dr Derrell Lolling gen surg consulted and requesting medical admission [MT]    Clinical Course User Index [MT] Geovanni Rahming, Kermit Balo, MD                                 Medical Decision Making Amount and/or Complexity of Data Reviewed Labs: ordered. Radiology: ordered.  Risk Prescription drug management.   This patient presents to the ED with concern for nausea, vomiting, abdominal pain. This involves an extensive number of treatment options, and is a complaint that carries with it a high risk of complications and morbidity.  The differential diagnosis includes bowel obstruction versus ileus versus constipation versus gastroenteritis or pancreatitis versus  Co-morbidities that complicate the patient evaluation: History of abdominal surgery and recurring bowel obstruction at risk of  recurrence  Additional history obtained from patient's daughter at bedside   I ordered and personally interpreted labs.  The pertinent results include: No emergent findings.  Chronic anemia  I ordered imaging studies including CT abdomen pelvis I independently visualized and interpreted imaging which showed small bowel obstruction with distal jejunal transition point I agree with the radiologist interpretation  I ordered medication including IV Toradol for pain, IV fluids and Zofran for nausea and vomiting obstruction symptoms  I have reviewed the patients home medicines and have  made adjustments as needed  I requested consultation with the general surgery Dr Derrell Lolling,  and discussed lab and imaging findings as well as pertinent plan - they recommend: medical admission, IV fluids, SBO protocol.  Initially we had a discussion about placing NG tube, but given the patient's history of recurring epistaxis, we felt that the risk may outweigh the benefits.  After the interventions noted above, I reevaluated the patient and found that they have: stayed the same   Dispostion:  After consideration of the diagnostic results and the patients response to treatment, I feel that the patent would benefit from medical admission         Final Clinical Impression(s) / ED Diagnoses Final diagnoses:  Small bowel obstruction [facility])    Rx / DC Orders ED Discharge Orders     None         Terald Sleeper, MD 01/18/24 2319

## 2024-01-18 NOTE — ED Triage Notes (Signed)
 Pt endorses generalized abd pain and cramping with n/v since last night. Denies dysuria.

## 2024-01-19 ENCOUNTER — Inpatient Hospital Stay (HOSPITAL_COMMUNITY)

## 2024-01-19 DIAGNOSIS — K56609 Unspecified intestinal obstruction, unspecified as to partial versus complete obstruction: Secondary | ICD-10-CM | POA: Diagnosis not present

## 2024-01-19 LAB — CBC WITH DIFFERENTIAL/PLATELET
Abs Immature Granulocytes: 0.03 10*3/uL (ref 0.00–0.07)
Basophils Absolute: 0 10*3/uL (ref 0.0–0.1)
Basophils Relative: 0 %
Eosinophils Absolute: 0.1 10*3/uL (ref 0.0–0.5)
Eosinophils Relative: 1 %
HCT: 29.9 % — ABNORMAL LOW (ref 36.0–46.0)
Hemoglobin: 8.7 g/dL — ABNORMAL LOW (ref 12.0–15.0)
Immature Granulocytes: 0 %
Lymphocytes Relative: 26 %
Lymphs Abs: 2 10*3/uL (ref 0.7–4.0)
MCH: 20 pg — ABNORMAL LOW (ref 26.0–34.0)
MCHC: 29.1 g/dL — ABNORMAL LOW (ref 30.0–36.0)
MCV: 68.7 fL — ABNORMAL LOW (ref 80.0–100.0)
Monocytes Absolute: 1.1 10*3/uL — ABNORMAL HIGH (ref 0.1–1.0)
Monocytes Relative: 14 %
Neutro Abs: 4.6 10*3/uL (ref 1.7–7.7)
Neutrophils Relative %: 59 %
Platelets: 278 10*3/uL (ref 150–400)
RBC: 4.35 MIL/uL (ref 3.87–5.11)
RDW: 18.5 % — ABNORMAL HIGH (ref 11.5–15.5)
WBC: 7.9 10*3/uL (ref 4.0–10.5)
nRBC: 0 % (ref 0.0–0.2)

## 2024-01-19 LAB — APTT: aPTT: 56 s — ABNORMAL HIGH (ref 24–36)

## 2024-01-19 LAB — BASIC METABOLIC PANEL
Anion gap: 8 (ref 5–15)
BUN: 16 mg/dL (ref 8–23)
CO2: 25 mmol/L (ref 22–32)
Calcium: 8.9 mg/dL (ref 8.9–10.3)
Chloride: 105 mmol/L (ref 98–111)
Creatinine, Ser: 0.81 mg/dL (ref 0.44–1.00)
GFR, Estimated: >60 mL/min (ref >60)
Glucose, Bld: 128 mg/dL — ABNORMAL HIGH (ref 70–99)
Potassium: 4.6 mmol/L (ref 3.5–5.1)
Sodium: 138 mmol/L (ref 135–145)

## 2024-01-19 LAB — GLUCOSE, CAPILLARY
Glucose-Capillary: 158 mg/dL — ABNORMAL HIGH (ref 70–99)
Glucose-Capillary: 128 mg/dL — ABNORMAL HIGH (ref 70–99)
Glucose-Capillary: 116 mg/dL — ABNORMAL HIGH (ref 70–99)
Glucose-Capillary: 158 mg/dL — ABNORMAL HIGH (ref 70–99)
Glucose-Capillary: 141 mg/dL — ABNORMAL HIGH (ref 70–99)
Glucose-Capillary: 131 mg/dL — ABNORMAL HIGH (ref 70–99)

## 2024-01-19 LAB — HEPARIN LEVEL (UNFRACTIONATED): Heparin Unfractionated: 0.9 [IU]/mL — ABNORMAL HIGH (ref 0.30–0.70)

## 2024-01-19 LAB — HEMOGLOBIN A1C
Hgb A1c MFr Bld: 6.8 % — ABNORMAL HIGH (ref 4.8–5.6)
Mean Plasma Glucose: 148.46 mg/dL

## 2024-01-19 MED ORDER — FLEET ENEMA RE ENEM
1.0000 | ENEMA | Freq: Once | RECTAL | Status: AC
  Administered 2024-01-19: 1 via RECTAL
  Filled 2024-01-19: qty 1

## 2024-01-19 MED ORDER — HEPARIN (PORCINE) 25000 UT/250ML-% IV SOLN
1450.0000 [IU]/h | INTRAVENOUS | Status: DC
  Administered 2024-01-19: 1300 [IU]/h via INTRAVENOUS
  Administered 2024-01-20: 1450 [IU]/h via INTRAVENOUS
  Filled 2024-01-19 (×2): qty 250

## 2024-01-19 MED ORDER — HYDROMORPHONE HCL 1 MG/ML IJ SOLN
1.0000 mg | INTRAMUSCULAR | Status: DC | PRN
  Administered 2024-01-19 – 2024-01-21 (×9): 1 mg via INTRAVENOUS
  Filled 2024-01-19 (×9): qty 1

## 2024-01-19 MED ORDER — HYDRALAZINE HCL 20 MG/ML IJ SOLN: 5.0000 mg | INTRAMUSCULAR | Status: DC | PRN

## 2024-01-19 MED ORDER — LACTATED RINGERS IV SOLN
INTRAVENOUS | Status: AC
Start: 2024-01-19 — End: 2024-01-20

## 2024-01-19 NOTE — Progress Notes (Signed)
 Received consult on behalf of pt. Pt has working IV in LAC. Asking for a new PIV site due to persistent beeping. Assessed bilateral arms with and without [place]. No appropriate veins found at this time. Recommend central access if pt loses current IV.

## 2024-01-19 NOTE — Progress Notes (Signed)
 PHARMACY - ANTICOAGULATION CONSULT NOTE  Pharmacy Consult for heparin Indication:  h/o recurrent DVT  Allergies  Allergen Reactions   Cephalexin Other (See Comments)    URINARY RETENTION = "blocked my urine"   Tapentadol     Other reaction(s): zombie Other reaction(s): zombie   Codeine Hives and Rash   Grass Pollen(K-O-R-T-Swt Vern) Rash   Nucynta [Tapentadol Hcl] Other (See Comments)    Altered mental status    Patient Measurements: Height: 5\' 8"  (172.7 cm) Weight: 88 kg (194 lb 0.1 oz) IBW/kg (Calculated) : 63.9 Heparin Dosing Weight: 82 kg  Vital Signs: Temp: 98.2 F (36.8 C) (03/20 1151) BP: 158/70 (03/20 1151) Pulse Rate: 81 (03/20 1151)  Labs: Recent Labs    01/18/24 1055 01/19/24 0649 01/19/24 1309  HGB 9.4* 8.7*  --   HCT 32.9* 29.9*  --   PLT 301 278  --   APTT  --   --  56*  HEPARINUNFRC  --   --  0.90*  CREATININE 0.48 0.81  --     Estimated Creatinine Clearance: 67.5 mL/min (by C-G formula based on SCr of 0.81 mg/dL).   Medical History: Past Medical History:  Diagnosis Date   Abnormal nuclear stress test 10/18/2013   Arm pain 10/18/2013   Arthritis    Cataract    RIGHT EYE   Chest pain 10/18/2013   Diabetes mellitus    dx over 5 yrs ago   GERD (gastroesophageal reflux disease)    Heart murmur    "yrs ago in [place] -- been here since 1999, "   History of hiatal hernia    Hypertension    Prolonged Q-T interval on ECG 04/20/2017   Pulmonary edema 04/20/2017     Assessment: 77yo female c/o generalized abdominal pain w/ N/V, found to have SBO, to transition from PTA apixaban for h/o recurrent DVT (last dose 3/18) to heparin while awaiting possible intervention.  Heparin level 0.9 is not correlating with aPTT 56, which is subtherapeutic on 1300 units/hr.  No issues with infusion or bleeding per RN.  Goal of Therapy:  Heparin level 0.3-0.7 units/ml aPTT 66-102 seconds Monitor platelets by anticoagulation protocol: Yes   Plan:   Increase heparin infusion to 1450 units/hr F/u aPTT until correlates with heparin level  Monitor daily aPTT, heparin level, CBC, signs/symptoms of bleeding   Alphia Moh, PharmD, BCPS, [facility] Clinical Pharmacist  Please check AMION for all [facility] Pharmacy phone numbers After 10:00 PM, call Main Pharmacy [phone]

## 2024-01-19 NOTE — Progress Notes (Signed)
 PROGRESS NOTE  Diane [name]  DOB: May 12, 1946  PCP: Georgianne Fick, MD YNW:[id number]  DOA: 01/18/2024  LOS: 1 day  Hospital Day: 2  Brief narrative: Diane [name] is a 78 y.o. female with PMH significant for DM2, HTN, HLD, prolonged QTc, multiple abdominal surgeries in the past including abdominal hysterectomy, hernia repair, h/o bowel obstructions which improved with conservative management. 3/10, patient presented to the ED with complaint of generalized abdominal pain, cramping, nausea, vomiting for 2 days.  In the ED, hemodynamically stable Labs showed WBC count normal, hemoglobin low at 9.4 with microcytosis, electrolytes normal CT abdomen pelvis was obtained which showed -Distended small bowel extending from the proximal through the distal jejunum, with formed stool seen within the distended distal jejunum. Transition point within the central abdomen with decompressed terminal ileum beyond this point, compatible with small-bowel obstruction. -Large amount of retained stool throughout the colon, increased since prior study, consistent with constipation.  Patient was seen by general surgery. Started on conservative management.  NG tube inserted Admitted to [facility]  Subjective: Patient was seen and examined this morning.  Pleasant elderly African-American female. Was a 9 out of 10 pain at the time of my evaluation.  IV Dilaudid was ordered. General surgery followed up this morning. Plan to continue conservative management  Assessment and plan: Small bowel obstruction Presented with abdominal pain, nausea vomiting for 2 days. Imaging findings as above. Started on collaborative management, NG suction General Surgery following N.p.o. status IV fluid -LR at 100 mL/h Pain control with IV Dilaudid PRN Monitor electrolytes Recent Labs  Lab 01/18/24 1055 01/19/24 0649  K 4.3 4.6   Large stool burden CT abdomen showed formed stool within the distal jejunum as well as  throughout the colon. Constipation could very well be the reason for bowel obstruction.  Fleet enema ordered.  Type 2 diabetes mellitus A1c 6.8 on 01/19/2024 PTA meds-none Continue SSI/Accu-Cheks Recent Labs  Lab 01/19/24 0159 01/19/24 0410 01/19/24 0836  GLUCAP 158* 128* 116*   Hypertension PTA meds- Lasix, amlodipine oral meds on hold Hydralazine as needed  Hyperlipidemia Statin on hold  H/o DVT Chronically anticoagulated on Eliquis.  Switched to IV heparin drip on admission  Recurrent epistaxis Has appt w/ ENT next week for intervention Monitor for epistaxis while in the hospital.  Autoimmune skin disease  Rheumatoid arthritis PTA meds- methotrexate, folic acid.  Currently on hold.  Chronic microcytic anemia Hemoglobin at baseline seems to be between 8 and 9. Currently at baseline.  Continue to monitor Obtain annual panel. Recent Labs    09/13/23 1757 09/17/23 0031 12/03/23 1850 01/18/24 1055 01/19/24 0649  HGB 8.4* 9.5* 8.1* 9.4* 8.7*  MCV 72.0* 74.3* 71.9* 71.8* 68.7*   H/o prolonged QTc  Obtain EKG.   Mobility: Encourage ambulation  Goals of care   Code Status: Full Code     DVT prophylaxis:  SCDs Start: 01/18/24 2335   Antimicrobials: None Fluid: LR at 100 mL/hr Consultants: General surgery Family Communication: Family not at bedside  Status: Inpatient Level of care:  Med-Surg   Patient is from: Home Needs to continue in-hospital care: On conservative management for bowel obstruction Anticipated d/c to: Pending clinical course   Diet:  Diet Order             Diet NPO time specified  Diet effective midnight                   Scheduled Meds:  insulin aspart  0-15 Units Subcutaneous Q4H  oxymetazoline  1 spray Each Nare BID   sodium phosphate  1 enema Rectal Once    PRN meds: acetaminophen **OR** acetaminophen, hydrALAZINE, HYDROmorphone (DILAUDID) injection, prochlorperazine, sodium chloride   Infusions:   heparin  1,300 Units/hr (01/19/24 0419)   lactated ringers 100 mL/hr at 01/19/24 0415    Antimicrobials: Anti-infectives (From admission, onward)    None       Objective: Vitals:   01/19/24 0200 01/19/24 0734  BP: (!) 147/73 (!) 154/83  Pulse: 94 74  Resp: 18 17  Temp: 98.8 F (37.1 C) 97.7 F (36.5 C)  SpO2: 99% 99%    Intake/Output Summary (Last 24 hours) at 01/19/2024 1138 Last data filed at 01/19/2024 0900 Gross per 24 hour  Intake 680.2 ml  Output 75 ml  Net 605.2 ml   Filed Weights   01/18/24 1046  Weight: 88 kg   Weight change:  Body mass index is 29.5 kg/m.   Physical Exam: General exam: Pleasant, elderly African-American female.  In distress from bowel obstruction Skin: No rashes, lesions or ulcers. HEENT: Atraumatic, normocephalic, no obvious bleeding Lungs: Clear to auscultation bilaterally,  CVS: S1, S2, no murmur,   GI/Abd: Soft, distended, mild to moderate generalized tenderness.  Bowel sounds sluggish CNS: Alert, awake, oriented x 3 Psychiatry: Sad affect Extremities: No pedal edema, no calf tenderness,   Data Review: I have personally reviewed the laboratory data and studies available.  F/u labs ordered Unresulted Labs (From admission, onward)     Start     Ordered   01/20/24 0500  Heparin level (unfractionated)  Daily,   R     Placed in "And" Linked Group   01/19/24 0331   01/20/24 0500  APTT  Daily,   R     Placed in "And" Linked Group   01/19/24 0331   01/20/24 0500  CBC  Daily,   R     Placed in "And" Linked Group   01/19/24 0331   01/20/24 0500  Vitamin B12  (Anemia Panel (PNL))  Tomorrow morning,   R        01/19/24 1133   01/20/24 0500  Folate  (Anemia Panel (PNL))  Tomorrow morning,   R        01/19/24 1133   01/20/24 0500  Iron and TIBC  (Anemia Panel (PNL))  Tomorrow morning,   R        01/19/24 1133   01/20/24 0500  Ferritin  (Anemia Panel (PNL))  Tomorrow morning,   R        01/19/24 1133   01/20/24 0500  Reticulocytes  (Anemia  Panel (PNL))  Tomorrow morning,   R        01/19/24 1133   01/20/24 0500  CBC with Differential/Platelet  Daily,   R      01/19/24 1133   01/20/24 0500  Basic metabolic panel  Daily,   R      01/19/24 1133   01/20/24 0500  Magnesium  Tomorrow morning,   R        01/19/24 1133   01/20/24 0500  Phosphorus  Tomorrow morning,   R        01/19/24 1133   01/19/24 1200  Heparin level (unfractionated)  Once-Timed,   TIMED       Placed in "And" Linked Group   01/19/24 0331   01/19/24 1200  APTT  Once-Timed,   TIMED       Placed in "And" Linked Group  01/19/24 0331            Total time spent in review of labs and imaging, patient evaluation, formulation of plan, documentation and communication with family: 55 minutes  Signed, Lorin Glass, MD [facility] 01/19/2024

## 2024-01-19 NOTE — Progress Notes (Signed)
 PHARMACY - ANTICOAGULATION CONSULT NOTE  Pharmacy Consult for heparin Indication:  h/o recurrent DVT  Allergies  Allergen Reactions   Cephalexin Other (See Comments)    URINARY RETENTION = "blocked my urine"   Tapentadol     Other reaction(s): zombie Other reaction(s): zombie   Codeine Hives and Rash   Grass Pollen(K-O-R-T-Swt Vern) Rash   Nucynta [Tapentadol Hcl] Other (See Comments)    Altered mental status    Patient Measurements: Height: 5\' 8"  (172.7 cm) Weight: 88 kg (194 lb 0.1 oz) IBW/kg (Calculated) : 63.9 Heparin Dosing Weight: 80kg  Vital Signs: Temp: 98.8 F (37.1 C) (03/20 0200) Temp Source: Oral (03/20 0200) BP: 147/73 (03/20 0200) Pulse Rate: 94 (03/20 0200)  Labs: Recent Labs    01/18/24 1055  HGB 9.4*  HCT 32.9*  PLT 301  CREATININE 0.48    Estimated Creatinine Clearance: 68.3 mL/min (by C-G formula based on SCr of 0.48 mg/dL).   Medical History: Past Medical History:  Diagnosis Date   Abnormal nuclear stress test 10/18/2013   Arm pain 10/18/2013   Arthritis    Cataract    RIGHT EYE   Chest pain 10/18/2013   Diabetes mellitus    dx over 5 yrs ago   GERD (gastroesophageal reflux disease)    Heart murmur    "yrs ago in [place] -- been here since 1999, "   History of hiatal hernia    Hypertension    Prolonged Q-T interval on ECG 04/20/2017   Pulmonary edema 04/20/2017    Medications:  Medications Prior to Admission  Medication Sig Dispense Refill Last Dose/Taking   amLODipine (NORVASC) 5 MG tablet Take 1 tablet (5 mg total) by mouth daily. 30 tablet 0 Past Week   apixaban (ELIQUIS) 5 MG TABS tablet Take 5 mg by mouth 2 (two) times daily.   01/17/2024   aspirin EC 81 MG tablet Take 81 mg by mouth daily. Swallow whole.   Past Week   atorvastatin (LIPITOR) 20 MG tablet Take 20 mg by mouth daily.   Past Week   brompheniramine-pseudoephedrine-DM 30-2-10 MG/5ML syrup Take 5 mLs by mouth 3 (three) times daily as needed.   Past Week    cyanocobalamin 1000 MCG tablet Take 1,000 mcg by mouth daily.   Past Week   famotidine (PEPCID) 20 MG tablet Take 20 mg by mouth at bedtime.   Past Week   furosemide (LASIX) 20 MG tablet Take 20 mg by mouth daily.   Past Week   gabapentin (NEURONTIN) 300 MG capsule Take 300 mg in the morning and 600 mg at night.  Take additional 300 mg as needed for severe pain   Past Week   oxybutynin (DITROPAN-XL) 5 MG 24 hr tablet TAKE 1 TABLET BY MOUTH ONCE DAILY FOR OVERACTIVE BLADDER   Past Week   oxymetazoline (AFRIN NASAL SPRAY) 0.05 % nasal spray Place 1 spray into both nostrils 2 (two) times daily. 30 mL 0 Past Week   pantoprazole (PROTONIX) 40 MG tablet Take 40 mg by mouth every morning.   Past Week   rosuvastatin (CRESTOR) 10 MG tablet Take 1 tablet (10 mg total) by mouth daily. 30 tablet 0 Past Week   metFORMIN (GLUCOPHAGE-XR) 500 MG 24 hr tablet Take 1,000 mg by mouth daily with breakfast. (Patient not taking: Reported on 01/19/2024)   Not Taking   Scheduled:   insulin aspart  0-15 Units Subcutaneous Q4H   oxymetazoline  1 spray Each Nare BID   Infusions:   lactated  ringers      Assessment: 78yo female c/o generalized abdominal pain w/ N/V, found to have SBO, to transition from apixaban for h/o recurrent DVT (last dose 3/18) to heparin while awaiting possible intervention.  Goal of Therapy:  Heparin level 0.3-0.7 units/ml aPTT 66-102 seconds Monitor platelets by anticoagulation protocol: Yes   Plan:  Start heparin infusion at 1300 units/hr. Monitor heparin levels, aPTT (while DOAC affects anti-Xa assay), and CBC.  Vernard Gambles, PharmD, BCPS  01/19/2024,3:27 AM

## 2024-01-19 NOTE — Plan of Care (Signed)

## 2024-01-19 NOTE — Progress Notes (Signed)
 Progress Note     Subjective: Pt still having abdominal pain although slightly improving, cramping in nature and generalized. No bowel function yet. She reports she has had bowel obstructions but no prior surgery for this.   Objective: Vital signs in last 24 hours: Temp:  [97.7 F (36.5 C)-99.1 F (37.3 C)] 97.7 F (36.5 C) (03/20 0734) Pulse Rate:  [74-97] 74 (03/20 0734) Resp:  [14-18] 17 (03/20 0734) BP: (138-177)/(70-83) 154/83 (03/20 0734) SpO2:  [97 %-99 %] 99 % (03/20 0734) Weight:  [88 kg] 88 kg (03/19 1046) Last BM Date : 01/16/24  Intake/Output from previous day: 03/19 0701 - 03/20 0700 In: 680.2 [NG/GT:180; IV Piggyback:500.2] Out: 75 [Emesis/NG output:75] Intake/Output this shift: No intake/output data recorded.  PE: General: pleasant, WD, elderly female who is laying in bed in NAD Heart: regular, rate, and rhythm.  Lungs: CRespiratory effort nonlabored Abd: soft, mild generalized ttp without guarding or peritonitis, mild distention, NGT with thin bilious drainage Psych: A&Ox3 with an appropriate affect.    Lab Results:  Recent Labs    01/18/24 1055 01/19/24 0649  WBC 9.4 7.9  HGB 9.4* 8.7*  HCT 32.9* 29.9*  PLT 301 278   BMET Recent Labs    01/18/24 1055 01/19/24 0649  NA 138 138  K 4.3 4.6  CL 104 105  CO2 21* 25  GLUCOSE 150* 128*  BUN 6* 16  CREATININE 0.48 0.81  CALCIUM 9.5 8.9   PT/INR No results for input(s): "LABPROT", "INR" in the last 72 hours. CMP     Component Value Date/Time   NA 138 01/19/2024 0649   NA 145 06/19/2018 0000   K 4.6 01/19/2024 0649   CL 105 01/19/2024 0649   CO2 25 01/19/2024 0649   GLUCOSE 128 (H) 01/19/2024 0649   BUN 16 01/19/2024 0649   BUN 7 06/19/2018 0000   CREATININE 0.81 01/19/2024 0649   CALCIUM 8.9 01/19/2024 0649   PROT 7.6 01/18/2024 1055   ALBUMIN 3.7 01/18/2024 1055   AST 17 01/18/2024 1055   ALT 12 01/18/2024 1055   ALKPHOS 47 01/18/2024 1055   BILITOT 0.6 01/18/2024 1055    GFRNONAA >60 01/19/2024 0649   GFRAA >60 12/09/2019 2243   Lipase     Component Value Date/Time   LIPASE 36 01/18/2024 1055       Studies/Results: DG Abdomen 1 View Result Date: 01/19/2024 CLINICAL DATA:  NG tube placement EXAM: ABDOMEN - 1 VIEW COMPARISON:  CT earlier today FINDINGS: NG tube tip is in the mid stomach. Dilated bowel noted in the upper abdomen. IMPRESSION: NG tube in the mid stomach. Electronically Signed   By: Charlett Nose M.D.   On: 01/19/2024 01:34   CT ABDOMEN PELVIS W CONTRAST Result Date: 01/18/2024 CLINICAL DATA:  Generalized abdominal pain and cramping, nausea and vomiting EXAM: CT ABDOMEN AND PELVIS WITH CONTRAST TECHNIQUE: Multidetector CT imaging of the abdomen and pelvis was performed using the standard protocol following bolus administration of intravenous contrast. RADIATION DOSE REDUCTION: This exam was performed according to the departmental dose-optimization program which includes automated exposure control, adjustment of the mA and/or kV according to patient size and/or use of iterative reconstruction technique. CONTRAST:  75mL OMNIPAQUE IOHEXOL 350 MG/ML SOLN COMPARISON:  09/17/2023 FINDINGS: Lower chest: No acute pleural or parenchymal lung disease. Hepatobiliary: No focal liver abnormality is seen. Status post cholecystectomy. No biliary dilatation. Pancreas: Unremarkable. No pancreatic ductal dilatation or surrounding inflammatory changes. Spleen: Normal in size without focal abnormality. Adrenals/Urinary Tract:  Adrenal glands are unremarkable. Kidneys are normal, without renal calculi, focal lesion, or hydronephrosis. Bladder is unremarkable. Stomach/Bowel: There is a large amount of retained stool throughout the colon consistent with constipation. Increased fecal burden since prior study. There is prominent dilation of the mid to distal jejunum, with fecalization within the distended small bowel loops which measure up to 5 cm in diameter. There is a transition  point within the central abdomen, reference image 62/3, with normal caliber of the ileum beyond this point. Findings are concerning for early or intermittent small bowel obstruction. No bowel wall thickening or inflammatory change.  Normal appendix. Vascular/Lymphatic: Aortic atherosclerosis. Stable aortic atherosclerosis. Stable stent extending from the left common iliac through the left external iliac vein. No pathologic adenopathy. Reproductive: Status post hysterectomy. No adnexal masses. Other: Trace pelvic free fluid. No free intraperitoneal gas. No abdominal wall hernia. Musculoskeletal: No acute or destructive bony abnormalities. Reconstructed images demonstrate no additional findings. IMPRESSION: 1. Distended small bowel extending from the proximal through the distal jejunum, with formed stool seen within the distended distal jejunum. Transition point within the central abdomen with decompressed terminal ileum beyond this point, compatible with small-bowel obstruction. 2. Large amount of retained stool throughout the colon, increased since prior study, consistent with constipation. 3. Trace pelvic free fluid. 4.  Aortic Atherosclerosis (ICD10-I70.0). Electronically Signed   By: Sharlet Salina M.D.   On: 01/18/2024 22:28    Anti-infectives: Anti-infectives (From admission, onward)    None        Assessment/Plan  SBO  - CT 3/19 with distended small bowel extending from proximal through distal jejunum, transition within central abdomen, large amount of retained stool throughout the colon  - SBO protocol  - no indication for emergent surgical intervention at this time but we will follow  - mobilize as tolerated - keep K >4.0 and Mg >2.0 to optimize bowel function   FEN: NPO, IVF per [facility] VTE: heparin gtt ID: no current abx  - per [facility] -  T2DM HTN GERD RA Hx of DVT on Eliquis  LOS: 1 day   I reviewed hospitalist notes, last 24 h vitals and pain scores, last 48 h intake and output,  last 24 h labs and trends, and last 24 h imaging results.  This care required moderate level of medical decision making.    Juliet Rude, [facility] Surgery 01/19/2024, 9:53 AM Please see Amion for pager number during day hours 7:00am-4:30pm

## 2024-01-20 ENCOUNTER — Inpatient Hospital Stay (HOSPITAL_COMMUNITY)

## 2024-01-20 DIAGNOSIS — M7989 Other specified soft tissue disorders: Secondary | ICD-10-CM | POA: Diagnosis not present

## 2024-01-20 DIAGNOSIS — K56609 Unspecified intestinal obstruction, unspecified as to partial versus complete obstruction: Secondary | ICD-10-CM | POA: Diagnosis not present

## 2024-01-20 LAB — CBC WITH DIFFERENTIAL/PLATELET
Abs Immature Granulocytes: 0.02 10*3/uL (ref 0.00–0.07)
Basophils Absolute: 0 10*3/uL (ref 0.0–0.1)
Basophils Relative: 0 %
Eosinophils Absolute: 0.1 10*3/uL (ref 0.0–0.5)
Eosinophils Relative: 1 %
HCT: 27.4 % — ABNORMAL LOW (ref 36.0–46.0)
Hemoglobin: 8.1 g/dL — ABNORMAL LOW (ref 12.0–15.0)
Immature Granulocytes: 0 %
Lymphocytes Relative: 21 %
Lymphs Abs: 1.6 10*3/uL (ref 0.7–4.0)
MCH: 20.5 pg — ABNORMAL LOW (ref 26.0–34.0)
MCHC: 29.6 g/dL — ABNORMAL LOW (ref 30.0–36.0)
MCV: 69.4 fL — ABNORMAL LOW (ref 80.0–100.0)
Monocytes Absolute: 1.4 10*3/uL — ABNORMAL HIGH (ref 0.1–1.0)
Monocytes Relative: 18 %
Neutro Abs: 4.6 10*3/uL (ref 1.7–7.7)
Neutrophils Relative %: 60 %
Platelets: 238 10*3/uL (ref 150–400)
RBC: 3.95 MIL/uL (ref 3.87–5.11)
RDW: 18.6 % — ABNORMAL HIGH (ref 11.5–15.5)
WBC: 7.6 10*3/uL (ref 4.0–10.5)
nRBC: 0 % (ref 0.0–0.2)

## 2024-01-20 LAB — IRON AND TIBC
Iron: 18 ug/dL — ABNORMAL LOW (ref 28–170)
Saturation Ratios: 5 % — ABNORMAL LOW (ref 10.4–31.8)
TIBC: 379 ug/dL (ref 250–450)
UIBC: 361 ug/dL

## 2024-01-20 LAB — BASIC METABOLIC PANEL
Anion gap: 14 (ref 5–15)
BUN: 21 mg/dL (ref 8–23)
CO2: 23 mmol/L (ref 22–32)
Calcium: 8.7 mg/dL — ABNORMAL LOW (ref 8.9–10.3)
Chloride: 103 mmol/L (ref 98–111)
Creatinine, Ser: 0.68 mg/dL (ref 0.44–1.00)
GFR, Estimated: >60 mL/min (ref >60)
Glucose, Bld: 127 mg/dL — ABNORMAL HIGH (ref 70–99)
Potassium: 4.4 mmol/L (ref 3.5–5.1)
Sodium: 140 mmol/L (ref 135–145)

## 2024-01-20 LAB — MAGNESIUM: Magnesium: 2.1 mg/dL (ref 1.7–2.4)

## 2024-01-20 LAB — GLUCOSE, CAPILLARY
Glucose-Capillary: 140 mg/dL — ABNORMAL HIGH (ref 70–99)
Glucose-Capillary: 113 mg/dL — ABNORMAL HIGH (ref 70–99)
Glucose-Capillary: 99 mg/dL (ref 70–99)
Glucose-Capillary: 116 mg/dL — ABNORMAL HIGH (ref 70–99)
Glucose-Capillary: 102 mg/dL — ABNORMAL HIGH (ref 70–99)
Glucose-Capillary: 76 mg/dL (ref 70–99)

## 2024-01-20 LAB — VITAMIN B12: Vitamin B-12: 305 pg/mL (ref 180–914)

## 2024-01-20 LAB — HEPARIN LEVEL (UNFRACTIONATED): Heparin Unfractionated: >1.1 [IU]/mL — ABNORMAL HIGH (ref 0.30–0.70)

## 2024-01-20 LAB — FOLATE: Folate: 14.4 ng/mL (ref >5.9)

## 2024-01-20 LAB — PHOSPHORUS: Phosphorus: 5.5 mg/dL — ABNORMAL HIGH (ref 2.5–4.6)

## 2024-01-20 LAB — FERRITIN: Ferritin: 8 ng/mL — ABNORMAL LOW (ref 11–307)

## 2024-01-20 LAB — RETICULOCYTES
Immature Retic Fract: 28.8 % — ABNORMAL HIGH (ref 2.3–15.9)
RBC.: 3.95 MIL/uL (ref 3.87–5.11)
Retic Count, Absolute: 54.1 10*3/uL (ref 19.0–186.0)
Retic Ct Pct: 1.4 % (ref 0.4–3.1)

## 2024-01-20 LAB — APTT: aPTT: 104 s — ABNORMAL HIGH (ref 24–36)

## 2024-01-20 MED ORDER — SODIUM CHLORIDE 0.9 % IV SOLN
500.0000 mg | Freq: Once | INTRAVENOUS | Status: AC
  Administered 2024-01-20: 500 mg via INTRAVENOUS
  Filled 2024-01-20: qty 25

## 2024-01-20 MED ORDER — IRON SUCROSE 500 MG IVPB - SIMPLE MED
500.0000 mg | Freq: Once | INTRAVENOUS | Status: DC
  Filled 2024-01-20: qty 275

## 2024-01-20 MED ORDER — LACTATED RINGERS IV SOLN: INTRAVENOUS | Status: DC

## 2024-01-20 MED ORDER — ENOXAPARIN SODIUM 40 MG/0.4ML IJ SOSY
40.0000 mg | PREFILLED_SYRINGE | Freq: Every day | INTRAMUSCULAR | Status: AC
Start: 2024-01-20 — End: ?
  Administered 2024-01-20 – 2024-01-24 (×5): 40 mg via SUBCUTANEOUS
  Filled 2024-01-20 (×4): qty 0.4

## 2024-01-20 MED ORDER — POLYETHYLENE GLYCOL 3350 17 G PO PACK
17.0000 g | PACK | Freq: Two times a day (BID) | ORAL | Status: DC
  Administered 2024-01-20 – 2024-01-21 (×2): 17 g via NASOGASTRIC
  Filled 2024-01-20 (×4): qty 1

## 2024-01-20 NOTE — Progress Notes (Signed)
 Progress Note     Subjective: Still distended with abdominal pain though pain is not worsening. No flatus or BM since PTA but she feels like she may need to have a bowel movement soon. No n/v.   Objective: Vital signs in last 24 hours: Temp:  [98.1 F (36.7 C)-98.6 F (37 C)] 98.1 F (36.7 C) (03/21 0805) Pulse Rate:  [66-94] 66 (03/21 0805) Resp:  [16-18] 17 (03/21 0805) BP: (130-158)/(65-78) 133/72 (03/21 0805) SpO2:  [86 %-100 %] 100 % (03/21 0805) Last BM Date : 01/16/24  Intake/Output from previous day: 03/20 0701 - 03/21 0700 In: 1364.3 [P.O.:50; I.V.:1224.3; NG/GT:90] Out: 1450 [Emesis/NG output:1450] Intake/Output this shift: No intake/output data recorded.  PE: General: pleasant, WD, elderly female who is laying in bed in NAD Heart: regular, rate, and rhythm.  Lungs: Respiratory effort nonlabored on room air Abd: soft, mild generalized ttp without guarding or peritonitis, mild distention, NGT with thin bilious drainage Psych: A&Ox3 with an appropriate affect.    Lab Results:  Recent Labs    01/19/24 0649 01/20/24 0636  WBC 7.9 7.6  HGB 8.7* 8.1*  HCT 29.9* 27.4*  PLT 278 238   BMET Recent Labs    01/19/24 0649 01/20/24 0636  NA 138 140  K 4.6 4.4  CL 105 103  CO2 25 23  GLUCOSE 128* 127*  BUN 16 21  CREATININE 0.81 0.68  CALCIUM 8.9 8.7*   PT/INR No results for input(s): "LABPROT", "INR" in the last 72 hours. CMP     Component Value Date/Time   NA 140 01/20/2024 0636   NA 145 06/19/2018 0000   K 4.4 01/20/2024 0636   CL 103 01/20/2024 0636   CO2 23 01/20/2024 0636   GLUCOSE 127 (H) 01/20/2024 0636   BUN 21 01/20/2024 0636   BUN 7 06/19/2018 0000   CREATININE 0.68 01/20/2024 0636   CALCIUM 8.7 (L) 01/20/2024 0636   PROT 7.6 01/18/2024 1055   ALBUMIN 3.7 01/18/2024 1055   AST 17 01/18/2024 1055   ALT 12 01/18/2024 1055   ALKPHOS 47 01/18/2024 1055   BILITOT 0.6 01/18/2024 1055   GFRNONAA >60 01/20/2024 0636   GFRAA >60  12/09/2019 2243   Lipase     Component Value Date/Time   LIPASE 36 01/18/2024 1055       Studies/Results: DG Abd Portable 1V-Small Bowel Obstruction Protocol-initial, 8 hr delay Result Date: 01/19/2024 CLINICAL DATA:  Small-bowel obstruction.  8 hour delayed film EXAM: PORTABLE ABDOMEN - 1 VIEW COMPARISON:  CT 01/18/2024. Limited upper abdominal x-ray 01/19/2024 FINDINGS: Left-sided iliac vein vascular stent identified. Enteric tube along the stomach. Surgical clips in the right upper quadrant of the abdomen. Contrast in the urinary bladder from prior CT. Persistent dilated loops of small bowel the mid abdomen. Diameters are seen approaching up to 4 cm. There is contrast along several loops of small bowel. No clear colonic contrast at this time. Prominent colonic stool identified. No obvious free air on these portable supine radiographs. Degenerative changes of the spine and pelvis. IMPRESSION: Contrast seen along dilated loops of small bowel diffusely. No clear colonic contrast at this time. Please correlate clinical presentation. Additional follow-up. Electronically Signed   By: Karen Kays M.D.   On: 01/19/2024 15:01   DG Abdomen 1 View Result Date: 01/19/2024 CLINICAL DATA:  NG tube placement EXAM: ABDOMEN - 1 VIEW COMPARISON:  CT earlier today FINDINGS: NG tube tip is in the mid stomach. Dilated bowel noted in the upper abdomen.  IMPRESSION: NG tube in the mid stomach. Electronically Signed   By: Charlett Nose M.D.   On: 01/19/2024 01:34   CT ABDOMEN PELVIS W CONTRAST Result Date: 01/18/2024 CLINICAL DATA:  Generalized abdominal pain and cramping, nausea and vomiting EXAM: CT ABDOMEN AND PELVIS WITH CONTRAST TECHNIQUE: Multidetector CT imaging of the abdomen and pelvis was performed using the standard protocol following bolus administration of intravenous contrast. RADIATION DOSE REDUCTION: This exam was performed according to the departmental dose-optimization program which includes automated  exposure control, adjustment of the mA and/or kV according to patient size and/or use of iterative reconstruction technique. CONTRAST:  75mL OMNIPAQUE IOHEXOL 350 MG/ML SOLN COMPARISON:  09/17/2023 FINDINGS: Lower chest: No acute pleural or parenchymal lung disease. Hepatobiliary: No focal liver abnormality is seen. Status post cholecystectomy. No biliary dilatation. Pancreas: Unremarkable. No pancreatic ductal dilatation or surrounding inflammatory changes. Spleen: Normal in size without focal abnormality. Adrenals/Urinary Tract: Adrenal glands are unremarkable. Kidneys are normal, without renal calculi, focal lesion, or hydronephrosis. Bladder is unremarkable. Stomach/Bowel: There is a large amount of retained stool throughout the colon consistent with constipation. Increased fecal burden since prior study. There is prominent dilation of the mid to distal jejunum, with fecalization within the distended small bowel loops which measure up to 5 cm in diameter. There is a transition point within the central abdomen, reference image 62/3, with normal caliber of the ileum beyond this point. Findings are concerning for early or intermittent small bowel obstruction. No bowel wall thickening or inflammatory change.  Normal appendix. Vascular/Lymphatic: Aortic atherosclerosis. Stable aortic atherosclerosis. Stable stent extending from the left common iliac through the left external iliac vein. No pathologic adenopathy. Reproductive: Status post hysterectomy. No adnexal masses. Other: Trace pelvic free fluid. No free intraperitoneal gas. No abdominal wall hernia. Musculoskeletal: No acute or destructive bony abnormalities. Reconstructed images demonstrate no additional findings. IMPRESSION: 1. Distended small bowel extending from the proximal through the distal jejunum, with formed stool seen within the distended distal jejunum. Transition point within the central abdomen with decompressed terminal ileum beyond this point,  compatible with small-bowel obstruction. 2. Large amount of retained stool throughout the colon, increased since prior study, consistent with constipation. 3. Trace pelvic free fluid. 4.  Aortic Atherosclerosis (ICD10-I70.0). Electronically Signed   By: Sharlet Salina M.D.   On: 01/18/2024 22:28    Anti-infectives: Anti-infectives (From admission, onward)    None        Assessment/Plan  SBO  - CT 3/19 with distended small bowel extending from proximal through distal jejunum, transition within central abdomen, large amount of retained stool throughout the colon  - SBO protocol - 8 hr film with contrast in SB. 24 hr film pending - NGT outptut >1L and no bowel function. Continue LIWS - no indication for emergent surgical intervention at this time. We will continue to follow  - mobilize as tolerated - keep K >4.0 and Mg >2.0 to optimize bowel function   FEN: NPO, NGT LIWS, IVF per [facility] VTE: heparin gtt ID: no current abx  - per [facility] -  T2DM HTN GERD RA Hx of DVT on Eliquis  LOS: 2 days   I reviewed hospitalist notes, last 24 h vitals and pain scores, last 48 h intake and output, last 24 h labs and trends, and last 24 h imaging results.  This care required moderate level of medical decision making.    Eric Form, [facility] Surgery 01/20/2024, 8:40 AM Please see Amion for pager number during  day hours 7:00am-4:30pm

## 2024-01-20 NOTE — Plan of Care (Signed)

## 2024-01-20 NOTE — Progress Notes (Signed)
 Lower extremity venous duplex completed. Please see CV Procedures for preliminary results.  Shona Simpson, RVT 01/20/24 11:02 AM

## 2024-01-20 NOTE — Progress Notes (Signed)
 PROGRESS NOTE  Diane [name]  DOB: [birth year]  PCP: Georgianne Fick, MD NGE:[id number]  DOA: 01/18/2024  LOS: 2 days  Hospital Day: 3  Brief narrative: Diane [name] is a 78 y.o. female with PMH significant for DM2, HTN, HLD, prolonged QTc, multiple abdominal surgeries in the past including abdominal hysterectomy, hernia repair, h/o bowel obstructions which improved with conservative management. 3/10, patient presented to the ED with complaint of generalized abdominal pain, cramping, nausea, vomiting for 2 days.  In the ED, hemodynamically stable Labs showed WBC count normal, hemoglobin low at 9.4 with microcytosis, electrolytes normal CT abdomen pelvis was obtained which showed -Distended small bowel extending from the proximal through the distal jejunum, with formed stool seen within the distended distal jejunum. Transition point within the central abdomen with decompressed terminal ileum beyond this point, compatible with small-bowel obstruction. -Large amount of retained stool throughout the colon, increased since prior study, consistent with constipation.  Patient was seen by general surgery. Started on conservative management.  NG tube inserted Admitted to [facility]  Subjective: Patient was seen and examined this morning.  Propped up in bed.  NG tube suction ongoing.  More than 1 L in 24 hours.  Continues to have intermittent crampy abdominal pain. Daughter at bedside Labs with hemoglobin down to 8.1.  Ferritin significant low at 8.  Assessment and plan: Small bowel obstruction Presented with abdominal pain, nausea vomiting for 2 days. Imaging findings as above. Started on collaborative management, NG suction General Surgery following N.p.o. status Currently on IV fluid -LR at 100 mL/h Pain control with IV Dilaudid PRN Monitor electrolytes Recent Labs  Lab 01/18/24 1055 01/19/24 0649 01/20/24 0636  K 4.3 4.6 4.4  MG  --   --  2.1  PHOS  --   --  5.5*   Large  stool burden CT abdomen showed formed stool within the distal jejunum as well as throughout the colon. Constipation could very well be the reason for bowel obstruction.  Fleet enema given yesterday with no success. I have sent a message to surgery Dr. Sheliah Hatch asking for suggestion on bowel regimen.  Acute on chronic microcytic anemia Severe emergency Hemoglobin chronically low, baseline above 9. Hemoglobin dropped today, no active bleeding, likely dilutional Ferritin level low at 8 suggestive of severe iron deficiency and probably chronic GI loss while on Eliquis. Anticoagulation held. Given IV Venofer today Manage GI intervention postdischarge.  Had seen Dr. Elnoria Howard in the past Recent Labs    09/17/23 0031 12/03/23 1850 01/18/24 1055 01/19/24 0649 01/20/24 0636  HGB 9.5* 8.1* 9.4* 8.7* 8.1*  MCV 74.3* 71.9* 71.8* 68.7* 69.4*  VITAMINB12  --   --   --   --  305  FOLATE  --   --   --   --  14.4  FERRITIN  --   --   --   --  8*  TIBC  --   --   --   --  379  IRON  --   --   --   --  18*  RETICCTPCT  --   --   --   --  1.4   H/o DVT Had recurrence of DVT in 2022 and hence on lifelong Eliquis  chronically anticoagulated on Eliquis.  Switched to IV heparin drip on admission Ultrasound duplex today does not show any evidence of DVT. Given low hemoglobin and low ferritin, I would hold heparin drip for now.  Recurrent epistaxis Patient reports recent recurrent epistaxis.  Was seen by ENT as an outpatient.  Patient reports she has a plan for some kind of cauterization next week. Currently no bleeding.  Type 2 diabetes mellitus A1c 6.8 on 01/19/2024 PTA meds-none Continue SSI/Accu-Cheks Recent Labs  Lab 01/19/24 2038 01/20/24 0216 01/20/24 0502 01/20/24 0753 01/20/24 1158  GLUCAP 131* 140* 113* 99 116*   Hypertension PTA meds- Lasix, amlodipine oral meds on hold Hydralazine as needed  Hyperlipidemia Statin on hold  Autoimmune skin disease  Rheumatoid arthritis PTA  meds- methotrexate, folic acid.  Currently on hold.  H/o prolonged QTc  EKG 3/20 with QTc 466 ms.  Monitor electrolytes   Mobility: Encourage ambulation  Goals of care   Code Status: Full Code     DVT prophylaxis:  enoxaparin (LOVENOX) injection 40 mg Start: 01/20/24 1030 SCDs Start: 01/18/24 2335   Antimicrobials: None Fluid: LR at 100 mL/hr to continue Consultants: General surgery Family Communication: Family not at bedside  Status: Inpatient Level of care:  Med-Surg   Patient is from: Home Needs to continue in-hospital care: On conservative management for bowel obstruction Anticipated d/c to: Pending clinical course   Diet:  Diet Order             Diet NPO time specified  Diet effective midnight                   Scheduled Meds:  enoxaparin (LOVENOX) injection  40 mg Subcutaneous Daily   insulin aspart  0-15 Units Subcutaneous Q4H   oxymetazoline  1 spray Each Nare BID   polyethylene glycol  17 g Per NG tube BID    PRN meds: acetaminophen **OR** acetaminophen, hydrALAZINE, HYDROmorphone (DILAUDID) injection, prochlorperazine, sodium chloride   Infusions:   iron sucrose 500 mg (01/20/24 1235)   lactated ringers 100 mL/hr at 01/20/24 1448    Antimicrobials: Anti-infectives (From admission, onward)    None       Objective: Vitals:   01/20/24 0501 01/20/24 0805  BP: 135/65 133/72  Pulse: 94 66  Resp: 18 17  Temp: 98.6 F (37 C) 98.1 F (36.7 C)  SpO2: 95% 100%    Intake/Output Summary (Last 24 hours) at 01/20/2024 1524 Last data filed at 01/20/2024 1100 Gross per 24 hour  Intake 1414.26 ml  Output 1450 ml  Net -35.74 ml   Filed Weights   01/18/24 1046  Weight: 88 kg   Weight change:  Body mass index is 29.5 kg/m.   Physical Exam: General exam: Pleasant, elderly African-American female.  In distress from abdominal cramps Skin: No rashes, lesions or ulcers. HEENT: Atraumatic, normocephalic, no obvious bleeding Lungs: Clear to  auscultation bilaterally,  CVS: S1, S2, no murmur,   GI/Abd: Soft, distended, mild to moderate generalized tenderness.  Bowel sounds sluggish. CNS: Alert, awake, oriented x 3 Psychiatry: Sad affect Extremities: No pedal edema, no calf tenderness,   Data Review: I have personally reviewed the laboratory data and studies available.  F/u labs ordered Unresulted Labs (From admission, onward)     Start     Ordered   01/21/24 0500  Type and screen  Once,   R        01/20/24 0851   01/20/24 0500  CBC with Differential/Platelet  Daily,   R      01/19/24 1133   01/20/24 0500  Basic metabolic panel  Daily,   R      01/19/24 1133            Total time spent in  review of labs and imaging, patient evaluation, formulation of plan, documentation and communication with family: 45 minutes  Signed, Lorin Glass, MD [facility] 01/20/2024

## 2024-01-21 DIAGNOSIS — K56609 Unspecified intestinal obstruction, unspecified as to partial versus complete obstruction: Secondary | ICD-10-CM | POA: Diagnosis not present

## 2024-01-21 LAB — BASIC METABOLIC PANEL
Anion gap: 6 (ref 5–15)
BUN: 13 mg/dL (ref 8–23)
CO2: 24 mmol/L (ref 22–32)
Calcium: 8.2 mg/dL — ABNORMAL LOW (ref 8.9–10.3)
Chloride: 108 mmol/L (ref 98–111)
Creatinine, Ser: 0.57 mg/dL (ref 0.44–1.00)
GFR, Estimated: >60 mL/min (ref >60)
Glucose, Bld: 109 mg/dL — ABNORMAL HIGH (ref 70–99)
Potassium: 3.9 mmol/L (ref 3.5–5.1)
Sodium: 138 mmol/L (ref 135–145)

## 2024-01-21 LAB — GLUCOSE, CAPILLARY
Glucose-Capillary: 106 mg/dL — ABNORMAL HIGH (ref 70–99)
Glucose-Capillary: 108 mg/dL — ABNORMAL HIGH (ref 70–99)
Glucose-Capillary: 109 mg/dL — ABNORMAL HIGH (ref 70–99)
Glucose-Capillary: 53 mg/dL — ABNORMAL LOW (ref 70–99)
Glucose-Capillary: 77 mg/dL (ref 70–99)
Glucose-Capillary: 80 mg/dL (ref 70–99)
Glucose-Capillary: 89 mg/dL (ref 70–99)
Glucose-Capillary: 92 mg/dL (ref 70–99)

## 2024-01-21 LAB — CBC WITH DIFFERENTIAL/PLATELET
Abs Immature Granulocytes: 0.03 10*3/uL (ref 0.00–0.07)
Basophils Absolute: 0 10*3/uL (ref 0.0–0.1)
Basophils Relative: 0 %
Eosinophils Absolute: 0.1 10*3/uL (ref 0.0–0.5)
Eosinophils Relative: 1 %
HCT: 27.3 % — ABNORMAL LOW (ref 36.0–46.0)
Hemoglobin: 7.8 g/dL — ABNORMAL LOW (ref 12.0–15.0)
Immature Granulocytes: 0 %
Lymphocytes Relative: 14 %
Lymphs Abs: 1.1 10*3/uL (ref 0.7–4.0)
MCH: 20.2 pg — ABNORMAL LOW (ref 26.0–34.0)
MCHC: 28.6 g/dL — ABNORMAL LOW (ref 30.0–36.0)
MCV: 70.7 fL — ABNORMAL LOW (ref 80.0–100.0)
Monocytes Absolute: 1 10*3/uL (ref 0.1–1.0)
Monocytes Relative: 14 %
Neutro Abs: 5.2 10*3/uL (ref 1.7–7.7)
Neutrophils Relative %: 71 %
Platelets: 227 10*3/uL (ref 150–400)
RBC: 3.86 MIL/uL — ABNORMAL LOW (ref 3.87–5.11)
RDW: 18.9 % — ABNORMAL HIGH (ref 11.5–15.5)
WBC: 7.4 10*3/uL (ref 4.0–10.5)
nRBC: 0.3 % — ABNORMAL HIGH (ref 0.0–0.2)

## 2024-01-21 LAB — TYPE AND SCREEN
ABO/RH(D): B POS
Antibody Screen: NEGATIVE

## 2024-01-21 MED ORDER — DEXTROSE-SODIUM CHLORIDE 5-0.9 % IV SOLN: INTRAVENOUS | Status: DC

## 2024-01-21 MED ORDER — BISACODYL 10 MG RE SUPP
10.0000 mg | Freq: Once | RECTAL | Status: AC
  Administered 2024-01-21: 10 mg via RECTAL
  Filled 2024-01-21: qty 1

## 2024-01-21 MED ORDER — DEXTROSE 50 % IV SOLN
25.0000 g | INTRAVENOUS | Status: AC
  Administered 2024-01-21: 25 g via INTRAVENOUS
  Filled 2024-01-21: qty 50

## 2024-01-21 NOTE — Plan of Care (Signed)

## 2024-01-21 NOTE — Progress Notes (Signed)
 PROGRESS NOTE  Diane [name]  DOB: 12-21-1945  PCP: Georgianne Fick, MD ZOX:[id number]  DOA: 01/18/2024  LOS: 3 days  Hospital Day: 4  Brief narrative: Diane [name] is a 78 y.o. female with PMH significant for DM2, HTN, HLD, prolonged QTc, multiple abdominal surgeries in the past including abdominal hysterectomy, hernia repair, h/o bowel obstructions which improved with conservative management. 3/10, patient presented to the ED with complaint of generalized abdominal pain, cramping, nausea, vomiting for 2 days.  In the ED, hemodynamically stable Labs showed WBC count normal, hemoglobin low at 9.4 with microcytosis, electrolytes normal CT abdomen pelvis was obtained which showed -Distended small bowel extending from the proximal through the distal jejunum, with formed stool seen within the distended distal jejunum. Transition point within the central abdomen with decompressed terminal ileum beyond this point, compatible with small-bowel obstruction. -Large amount of retained stool throughout the colon, increased since prior study, consistent with constipation.  Patient was seen by general surgery. Started on conservative management.  NG tube inserted Admitted to [facility]  Subjective: Patient was seen and examined this morning. Before I saw her, she was trying to have bowel movement but has not succeeded. Patient had mentioned earlier to surgery team that she was passing flatus.  Could not confirm that with me. NG tube was removed today.  Assessment and plan: Small bowel obstruction Presented with abdominal pain, nausea vomiting for 2 days. Imaging findings as above. Started on collaborative management, NG suction General Surgery following N.p.o. status Currently on IV fluid Pain control with IV Dilaudid PRN Monitor electrolytes Recent Labs  Lab 01/18/24 1055 01/19/24 0649 01/20/24 0636 01/21/24 0436  K 4.3 4.6 4.4 3.9  MG  --   --  2.1  --   PHOS  --   --  5.5*  --     Large stool burden CT abdomen showed formed stool within the distal jejunum as well as throughout the colon. Constipation could very well be the reason for bowel obstruction.  Has been tried on Fleet enema, MiraLAX and Gastrografin Notes associated.  Acute on chronic microcytic anemia Severe emergency Hemoglobin chronically low, baseline above 9. Hemoglobin dropped today, no active bleeding, likely dilutional Ferritin level low at 8 suggestive of severe iron deficiency and probably chronic GI loss while on Eliquis. Anticoagulation held. Given IV Venofer on 3/21 Manage GI intervention postdischarge.  Had seen Dr. Elnoria Howard in the past Recent Labs    12/03/23 1850 01/18/24 1055 01/19/24 0649 01/20/24 0636 01/21/24 0436  HGB 8.1* 9.4* 8.7* 8.1* 7.8*  MCV 71.9* 71.8* 68.7* 69.4* 70.7*  VITAMINB12  --   --   --  305  --   FOLATE  --   --   --  14.4  --   FERRITIN  --   --   --  8*  --   TIBC  --   --   --  379  --   IRON  --   --   --  18*  --   RETICCTPCT  --   --   --  1.4  --    H/o DVT Had recurrence of DVT in 2022 and hence on lifelong Eliquis  chronically anticoagulated on Eliquis.  Switched to IV heparin drip on admission Ultrasound duplex today does not show any evidence of DVT. Given low hemoglobin and low ferritin, I would hold heparin drip for now.  Recurrent epistaxis Patient reports recent recurrent epistaxis.  Was seen by ENT as an outpatient.  Patient reports she has a plan for some kind of cauterization next week. Currently no bleeding.  Type 2 diabetes mellitus Hypoglycemia A1c 6.8 on 01/19/2024 PTA meds-none Currently on SSI/Accu-Cheks.  Blood sugar level low at 53 earlier today.  Switched IV fluid from LR to D5 NS. Recent Labs  Lab 01/21/24 0008 01/21/24 0412 01/21/24 0733 01/21/24 1205 01/21/24 1236  GLUCAP 109* 106* 80 53* 108*   Hypertension PTA meds- Lasix, amlodipine oral meds on hold Hydralazine as needed  Hyperlipidemia Statin on  hold  Autoimmune skin disease  Rheumatoid arthritis PTA meds- methotrexate, folic acid.  Currently on hold.  H/o prolonged QTc  EKG 3/20 with QTc 466 ms.  Monitor electrolytes   Mobility: Encourage ambulation  Goals of care   Code Status: Full Code     DVT prophylaxis:  enoxaparin (LOVENOX) injection 40 mg Start: 01/20/24 1030 SCDs Start: 01/18/24 2335   Antimicrobials: None Fluid: D5 NS at 50 mL/h Consultants: General surgery Family Communication: Family not at bedside  Status: Inpatient Level of care:  Med-Surg   Patient is from: Home Needs to continue in-hospital care: On conservative management for bowel obstruction Anticipated d/c to: Pending clinical course   Diet:  Diet Order             Diet NPO time specified Except for: Sips with Meds, Ice Chips  Diet effective midnight                   Scheduled Meds:  enoxaparin (LOVENOX) injection  40 mg Subcutaneous Daily   insulin aspart  0-15 Units Subcutaneous Q4H   polyethylene glycol  17 g Per NG tube BID    PRN meds: acetaminophen **OR** acetaminophen, hydrALAZINE, HYDROmorphone (DILAUDID) injection, prochlorperazine, sodium chloride   Infusions:   dextrose 5 % and 0.9 % NaCl 75 mL/hr at 01/21/24 1313    Antimicrobials: Anti-infectives (From admission, onward)    None       Objective: Vitals:   01/21/24 0410 01/21/24 0731  BP: (!) 159/69 (!) 155/66  Pulse: 91 95  Resp: 16 17  Temp: 98.8 F (37.1 C) 98.5 F (36.9 C)  SpO2: 94% 92%    Intake/Output Summary (Last 24 hours) at 01/21/2024 1447 Last data filed at 01/21/2024 1300 Gross per 24 hour  Intake 200 ml  Output 625 ml  Net -425 ml   Filed Weights   01/18/24 1046  Weight: 88 kg   Weight change:  Body mass index is 29.5 kg/m.   Physical Exam: General exam: Pleasant, elderly African-American female.  In distress from abdominal cramps Skin: No rashes, lesions or ulcers. HEENT: Atraumatic, normocephalic, no obvious  bleeding Lungs: Clear to auscultation bilaterally,  CVS: S1, S2, no murmur,   GI/Abd: Soft, distended, mild to moderate generalized tenderness.  Bowel sounds sluggish. CNS: Alert, awake, oriented x 3 Psychiatry: Sad affect Extremities: No pedal edema, no calf tenderness,   Data Review: I have personally reviewed the laboratory data and studies available.  F/u labs ordered Unresulted Labs (From admission, onward)     Start     Ordered   01/20/24 0500  CBC with Differential/Platelet  Daily,   R      01/19/24 1133   01/20/24 0500  Basic metabolic panel  Daily,   R      01/19/24 1133            Total time spent in review of labs and imaging, patient evaluation, formulation of plan, documentation and communication with  family: 45 minutes  Signed, Lorin Glass, MD [facility] 01/21/2024

## 2024-01-21 NOTE — Progress Notes (Signed)
 Progress Note     Subjective: Having flatus, no BM yet  Objective: Vital signs in last 24 hours: Temp:  [98.4 F (36.9 C)-99.5 F (37.5 C)] 98.5 F (36.9 C) (03/22 0731) Pulse Rate:  [88-95] 95 (03/22 0731) Resp:  [16-18] 17 (03/22 0731) BP: (139-159)/(66-71) 155/66 (03/22 0731) SpO2:  [73 %-94 %] 92 % (03/22 0731) Last BM Date : 01/16/24  Intake/Output from previous day: 03/21 0701 - 03/22 0700 In: 310 [P.O.:100; NG/GT:210] Out: 1125 [Emesis/NG output:1125] Intake/Output this shift: No intake/output data recorded.  PE: General: pleasant, WD, elderly female who is laying in bed in NAD Heart: regular, rate, and rhythm.  Lungs: Respiratory effort nonlabored on room air Abd: soft, no distention, NGT with thin clear drainage Psych: A&Ox3 with an appropriate affect.    Lab Results:  Recent Labs    01/20/24 0636 01/21/24 0436  WBC 7.6 7.4  HGB 8.1* 7.8*  HCT 27.4* 27.3*  PLT 238 227   BMET Recent Labs    01/20/24 0636 01/21/24 0436  NA 140 138  K 4.4 3.9  CL 103 108  CO2 23 24  GLUCOSE 127* 109*  BUN 21 13  CREATININE 0.68 0.57  CALCIUM 8.7* 8.2*   PT/INR No results for input(s): "LABPROT", "INR" in the last 72 hours. CMP     Component Value Date/Time   NA 138 01/21/2024 0436   NA 145 06/19/2018 0000   K 3.9 01/21/2024 0436   CL 108 01/21/2024 0436   CO2 24 01/21/2024 0436   GLUCOSE 109 (H) 01/21/2024 0436   BUN 13 01/21/2024 0436   BUN 7 06/19/2018 0000   CREATININE 0.57 01/21/2024 0436   CALCIUM 8.2 (L) 01/21/2024 0436   PROT 7.6 01/18/2024 1055   ALBUMIN 3.7 01/18/2024 1055   AST 17 01/18/2024 1055   ALT 12 01/18/2024 1055   ALKPHOS 47 01/18/2024 1055   BILITOT 0.6 01/18/2024 1055   GFRNONAA >60 01/21/2024 0436   GFRAA >60 12/09/2019 2243   Lipase     Component Value Date/Time   LIPASE 36 01/18/2024 1055       Studies/Results: VAS [place] LOWER EXTREMITY VENOUS (DVT) Result Date: 01/20/2024  Lower Venous DVT Study Patient Name:   FEIGE LOWDERMILK  Date of Exam:   01/20/2024 Medical Rec #: [id number]       Accession #:    [id number] Date of Birth: [birth year]       Patient Gender: F Patient Age:   78 years Exam Location:  [facility] Procedure:      VAS [place] LOWER EXTREMITY VENOUS (DVT) Referring Phys: Lorin Glass --------------------------------------------------------------------------------  Indications: Swelling, and Pain.  Risk Factors: DVT Hx past pregnancy. Anticoagulation: Heparin. Comparison Study: No significant changes seen since previous exam 12/24/21. Performing Technologist: Shona Simpson  Examination Guidelines: A complete evaluation includes B-mode imaging, spectral Doppler, color Doppler, and power Doppler as needed of all accessible portions of each vessel. Bilateral testing is considered an integral part of a complete examination. Limited examinations for reoccurring indications may be performed as noted. The reflux portion of the exam is performed with the patient in reverse Trendelenburg.  +---------+---------------+---------+-----------+----------+--------------+ RIGHT    CompressibilityPhasicitySpontaneityPropertiesThrombus Aging +---------+---------------+---------+-----------+----------+--------------+ CFV      Full           Yes      Yes                                 +---------+---------------+---------+-----------+----------+--------------+  SFJ      Full                                                        +---------+---------------+---------+-----------+----------+--------------+ FV Prox  Full                                                        +---------+---------------+---------+-----------+----------+--------------+ FV Mid   Full                                                        +---------+---------------+---------+-----------+----------+--------------+ FV DistalFull                                                         +---------+---------------+---------+-----------+----------+--------------+ PFV      Full                                                        +---------+---------------+---------+-----------+----------+--------------+ POP      Full           Yes      Yes                                 +---------+---------------+---------+-----------+----------+--------------+ PTV      Full                                                        +---------+---------------+---------+-----------+----------+--------------+ PERO     Full                                                        +---------+---------------+---------+-----------+----------+--------------+   +---------+---------------+---------+-----------+----------+--------------+ LEFT     CompressibilityPhasicitySpontaneityPropertiesThrombus Aging +---------+---------------+---------+-----------+----------+--------------+ CFV      Full           Yes      Yes                                 +---------+---------------+---------+-----------+----------+--------------+ SFJ      Full                                                        +---------+---------------+---------+-----------+----------+--------------+  FV Prox  Full                                                        +---------+---------------+---------+-----------+----------+--------------+ FV Mid   Full                                                        +---------+---------------+---------+-----------+----------+--------------+ FV DistalFull                                                        +---------+---------------+---------+-----------+----------+--------------+ PFV      Full                                                        +---------+---------------+---------+-----------+----------+--------------+ POP      Full           Yes      Yes                                  +---------+---------------+---------+-----------+----------+--------------+ PTV      Full                                                        +---------+---------------+---------+-----------+----------+--------------+ PERO     Full                                                        +---------+---------------+---------+-----------+----------+--------------+     Summary: BILATERAL: - No evidence of deep vein thrombosis seen in the lower extremities, bilaterally. -No evidence of popliteal cyst, bilaterally.   *See table(s) above for measurements and observations. Electronically signed by Lemar Livings MD on 01/20/2024 at 2:18:47 PM.    Final    DG Abd Portable 1V Result Date: 01/20/2024 CLINICAL DATA:  Small bowel obstruction. EXAM: PORTABLE ABDOMEN - 1 VIEW COMPARISON:  One-view abdomen 01/19/2024. FINDINGS: Lung bases are clear. Side port of the NG tube is just beyond the GE junction. Mild gaseous distension of small bowel remains. Gas and stool are present throughout the colon. No residual obstruction is present. Vascular stent is noted. IMPRESSION: 1. Mild gaseous distension of small bowel without residual obstruction. 2. Side port of the NG tube is just beyond the GE junction. Electronically Signed   By: Marin Roberts M.D.   On: 01/20/2024 13:28   DG Abd Portable 1V-Small Bowel Obstruction Protocol-initial, 8 hr delay Result Date: 01/19/2024 CLINICAL DATA:  Small-bowel obstruction.  8 hour delayed film EXAM: PORTABLE ABDOMEN - 1 VIEW COMPARISON:  CT 01/18/2024. Limited upper abdominal x-ray 01/19/2024 FINDINGS: Left-sided iliac vein vascular stent identified. Enteric tube along the stomach. Surgical clips in the right upper quadrant of the abdomen. Contrast in the urinary bladder from prior CT. Persistent dilated loops of small bowel the mid abdomen. Diameters are seen approaching up to 4 cm. There is contrast along several loops of small bowel. No clear colonic contrast at this  time. Prominent colonic stool identified. No obvious free air on these portable supine radiographs. Degenerative changes of the spine and pelvis. IMPRESSION: Contrast seen along dilated loops of small bowel diffusely. No clear colonic contrast at this time. Please correlate clinical presentation. Additional follow-up. Electronically Signed   By: Karen Kays M.D.   On: 01/19/2024 15:01    Anti-infectives: Anti-infectives (From admission, onward)    None        Assessment/Plan  SBO  - CT 3/19 with distended small bowel extending from proximal through distal jejunum, transition within central abdomen, large amount of retained stool throughout the colon  - SBO protocol - 8 hr film with contrast in SB. 24 hr film contrast in colon - NGT outptut clear and passing flatus- will d/c today but keep NPO - suppository today to help stimulate bowel function - no indication for emergent surgical intervention at this time. We will continue to follow  - mobilize as tolerated - keep K >4.0 and Mg >2.0 to optimize bowel function   FEN: NPO, NGT LIWS, IVF per [facility] VTE: heparin gtt ID: no current abx  - per [facility] -  T2DM HTN GERD RA Hx of DVT on Eliquis  LOS: 3 days   I reviewed hospitalist notes, last 24 h vitals and pain scores, last 48 h intake and output, last 24 h labs and trends, and last 24 h imaging results.  This care required moderate level of medical decision making.    Vanita Panda, MD [facility] Surgery 01/21/2024, 8:06 AM Please see Amion for pager number during day hours 7:00am-4:30pm

## 2024-01-22 DIAGNOSIS — K56609 Unspecified intestinal obstruction, unspecified as to partial versus complete obstruction: Secondary | ICD-10-CM | POA: Diagnosis not present

## 2024-01-22 LAB — CBC WITH DIFFERENTIAL/PLATELET
Abs Immature Granulocytes: 0.06 10*3/uL (ref 0.00–0.07)
Basophils Absolute: 0 10*3/uL (ref 0.0–0.1)
Basophils Relative: 0 %
Eosinophils Absolute: 0.1 10*3/uL (ref 0.0–0.5)
Eosinophils Relative: 1 %
HCT: 25.9 % — ABNORMAL LOW (ref 36.0–46.0)
Hemoglobin: 7.5 g/dL — ABNORMAL LOW (ref 12.0–15.0)
Immature Granulocytes: 1 %
Lymphocytes Relative: 8 %
Lymphs Abs: 0.7 10*3/uL (ref 0.7–4.0)
MCH: 20.2 pg — ABNORMAL LOW (ref 26.0–34.0)
MCHC: 29 g/dL — ABNORMAL LOW (ref 30.0–36.0)
MCV: 69.8 fL — ABNORMAL LOW (ref 80.0–100.0)
Monocytes Absolute: 0.7 10*3/uL (ref 0.1–1.0)
Monocytes Relative: 9 %
Neutro Abs: 6.4 10*3/uL (ref 1.7–7.7)
Neutrophils Relative %: 81 %
Platelets: 215 10*3/uL (ref 150–400)
RBC: 3.71 MIL/uL — ABNORMAL LOW (ref 3.87–5.11)
RDW: 18.9 % — ABNORMAL HIGH (ref 11.5–15.5)
WBC: 7.9 10*3/uL (ref 4.0–10.5)
nRBC: 0.3 % — ABNORMAL HIGH (ref 0.0–0.2)

## 2024-01-22 LAB — BASIC METABOLIC PANEL
Anion gap: 8 (ref 5–15)
Anion gap: 9 (ref 5–15)
BUN: 7 mg/dL — ABNORMAL LOW (ref 8–23)
BUN: 6 mg/dL — ABNORMAL LOW (ref 8–23)
CO2: 21 mmol/L — ABNORMAL LOW (ref 22–32)
CO2: 22 mmol/L (ref 22–32)
Calcium: 8 mg/dL — ABNORMAL LOW (ref 8.9–10.3)
Calcium: 8.6 mg/dL — ABNORMAL LOW (ref 8.9–10.3)
Chloride: 112 mmol/L — ABNORMAL HIGH (ref 98–111)
Chloride: 108 mmol/L (ref 98–111)
Creatinine, Ser: 0.51 mg/dL (ref 0.44–1.00)
Creatinine, Ser: 0.57 mg/dL (ref 0.44–1.00)
GFR, Estimated: >60 mL/min (ref >60)
GFR, Estimated: >60 mL/min (ref >60)
Glucose, Bld: 119 mg/dL — ABNORMAL HIGH (ref 70–99)
Glucose, Bld: 130 mg/dL — ABNORMAL HIGH (ref 70–99)
Potassium: 2.5 mmol/L — CL (ref 3.5–5.1)
Potassium: 3.1 mmol/L — ABNORMAL LOW (ref 3.5–5.1)
Sodium: 141 mmol/L (ref 135–145)
Sodium: 139 mmol/L (ref 135–145)

## 2024-01-22 LAB — GLUCOSE, CAPILLARY
Glucose-Capillary: 96 mg/dL (ref 70–99)
Glucose-Capillary: 106 mg/dL — ABNORMAL HIGH (ref 70–99)
Glucose-Capillary: 156 mg/dL — ABNORMAL HIGH (ref 70–99)
Glucose-Capillary: 79 mg/dL (ref 70–99)
Glucose-Capillary: 91 mg/dL (ref 70–99)

## 2024-01-22 MED ORDER — POTASSIUM CHLORIDE 10 MEQ/100ML IV SOLN
10.0000 meq | INTRAVENOUS | Status: AC
  Administered 2024-01-22 (×4): 10 meq via INTRAVENOUS
  Filled 2024-01-22 (×4): qty 100

## 2024-01-22 MED ORDER — IRON SUCROSE 500 MG IVPB - SIMPLE MED
500.0000 mg | Freq: Once | INTRAVENOUS | Status: DC
  Filled 2024-01-22: qty 275

## 2024-01-22 MED ORDER — SODIUM CHLORIDE 0.9 % IV SOLN
500.0000 mg | Freq: Once | INTRAVENOUS | Status: AC
  Administered 2024-01-22: 500 mg via INTRAVENOUS
  Filled 2024-01-22: qty 25

## 2024-01-22 MED ORDER — MELATONIN 5 MG PO TABS
5.0000 mg | ORAL_TABLET | Freq: Every evening | ORAL | Status: DC | PRN
  Administered 2024-01-22: 5 mg via ORAL
  Filled 2024-01-22: qty 1

## 2024-01-22 MED ORDER — POTASSIUM CHLORIDE CRYS ER 20 MEQ PO TBCR
40.0000 meq | EXTENDED_RELEASE_TABLET | Freq: Once | ORAL | Status: AC
  Administered 2024-01-22: 40 meq via ORAL
  Filled 2024-01-22: qty 2

## 2024-01-22 MED ORDER — POLYETHYLENE GLYCOL 3350 17 G PO PACK
17.0000 g | PACK | Freq: Two times a day (BID) | ORAL | Status: DC
  Administered 2024-01-22: 17 g via ORAL
  Filled 2024-01-22 (×3): qty 1

## 2024-01-22 NOTE — Progress Notes (Signed)
 PROGRESS NOTE  Diane [name]  DOB: [birth year]  PCP: Georgianne Fick, MD JYN:[id number]  DOA: 01/18/2024  LOS: 4 days  Hospital Day: 5  Brief narrative: Diane [name] is a 78 y.o. female with PMH significant for DM2, HTN, HLD, prolonged QTc, multiple abdominal surgeries in the past including abdominal hysterectomy, hernia repair, h/o bowel obstructions which improved with conservative management. 3/10, patient presented to the ED with complaint of generalized abdominal pain, cramping, nausea, vomiting for 2 days.  In the ED, hemodynamically stable Labs showed WBC count normal, hemoglobin low at 9.4 with microcytosis, electrolytes normal CT abdomen pelvis was obtained which showed -Distended small bowel extending from the proximal through the distal jejunum, with formed stool seen within the distended distal jejunum. Transition point within the central abdomen with decompressed terminal ileum beyond this point, compatible with small-bowel obstruction. -Large amount of retained stool throughout the colon, increased since prior study, consistent with constipation.  Patient was seen by general surgery. Started on conservative management.  NG tube inserted Admitted to [facility] Clinically improving  Subjective: Patient was seen and examined this morning. Sitting up in recliner.  Feels much better.  Had 4 large bowel movements since yesterday.  Abdominal distention and pressure improving.  Thinks she still has more to poop. No nausea or vomiting.  Noted initiation of clear liquid diet by general surgery this morning Will stop IV fluid .    Assessment and plan: Small bowel obstruction Presented with abdominal pain, nausea vomiting for 2 days. Imaging findings as above. Improved with conservative management.  NG tube out yesterday. On clear liquid diet today Adequately hydrated.  Stop IV fluid today Pain control to continue with IV Dilaudid PRN  Large stool burden CT abdomen showed  formed stool within the distal jejunum as well as throughout the colon. Constipation could very well be the reason for bowel obstruction.  Constipation resolving with twice daily MiraLAX. Recommend regular outpatient bowel regimen.  Hypokalemia Potassium level significantly low at 2.5 today.  Oral and IV replacement ordered. Repeat labs this afternoon Recent Labs  Lab 01/18/24 1055 01/19/24 0649 01/20/24 0636 01/21/24 0436 01/22/24 0720  K 4.3 4.6 4.4 3.9 2.5*  MG  --   --  2.1  --   --   PHOS  --   --  5.5*  --   --    Acute on chronic microcytic anemia Severe iron deficiency Hemoglobin chronically low, baseline above 9. Hemoglobin dropped and is stabilized between 7 and 8.  No active bleeding. Ferritin level was low at 8 suggestive of severe iron deficiency and probably chronic GI loss while on Eliquis. Anticoagulation held. Given IV Venofer on 3/21.  Plan to repeat today. Manage GI intervention postdischarge.  Had seen Dr. Elnoria Howard in the past. Recent Labs    01/18/24 1055 01/19/24 0649 01/20/24 0636 01/21/24 0436 01/22/24 0720  HGB 9.4* 8.7* 8.1* 7.8* 7.5*  MCV 71.8* 68.7* 69.4* 70.7* 69.8*  VITAMINB12  --   --  305  --   --   FOLATE  --   --  14.4  --   --   FERRITIN  --   --  8*  --   --   TIBC  --   --  379  --   --   IRON  --   --  18*  --   --   RETICCTPCT  --   --  1.4  --   --    H/o DVT Had  recurrence of DVT in 2022 and hence on lifelong Eliquis  chronically anticoagulated on Eliquis.  Switched to IV heparin drip on admission Ultrasound duplex does not show any evidence of DVT. Given low hemoglobin and low ferritin, heparin drip was held. If hemoglobin is stable, plan to resume Eliquis at discharge  Recurrent epistaxis Patient reports recent recurrent epistaxis.  Was seen by ENT as an outpatient.  Patient reports she has a plan for some kind of cauterization next week. Currently no bleeding.  Type 2 diabetes mellitus Hypoglycemia A1c 6.8 on  01/19/2024 PTA meds-none.  Had episodes of low blood sugar in the hospital due to n.p.o. status.  Was placed on dextrose drip.  Allowed for clear liquid diet today.  Can stop dextrose drip Recent Labs  Lab 01/21/24 1601 01/21/24 2100 01/21/24 2349 01/22/24 0439 01/22/24 0824  GLUCAP 92 77 89 96 106*   Hypertension PTA meds- Lasix, amlodipine oral meds on hold Hydralazine as needed  Hyperlipidemia Statin to resume at discharge  Autoimmune skin disease  Rheumatoid arthritis PTA meds- methotrexate, folic acid to resume at discharge  H/o prolonged QTc  EKG 3/20 with QTc 466 ms.  Monitor electrolytes   Mobility: Ambulating on the hallway independently  Goals of care   Code Status: Full Code     DVT prophylaxis:  enoxaparin (LOVENOX) injection 40 mg Start: 01/20/24 1030 SCDs Start: 01/18/24 2335   Antimicrobials: None Fluid: Stop IV fluid Consultants: General surgery Family Communication: Family not at bedside  Status: Inpatient Level of care:  Med-Surg   Patient is from: Home Needs to continue in-hospital care: Improving with conservative management for bowel obstruction Anticipated d/c to: If continues to improve, plan to discharge home tomorrow.   Diet:  Diet Order             Diet clear liquid Room service appropriate? Yes; Fluid consistency: Thin  Diet effective now                   Scheduled Meds:  enoxaparin (LOVENOX) injection  40 mg Subcutaneous Daily   insulin aspart  0-15 Units Subcutaneous Q4H   polyethylene glycol  17 g Oral BID    PRN meds: acetaminophen **OR** acetaminophen, hydrALAZINE, HYDROmorphone (DILAUDID) injection, prochlorperazine, sodium chloride   Infusions:   iron sucrose     potassium chloride 10 mEq (01/22/24 0935)    Antimicrobials: Anti-infectives (From admission, onward)    None       Objective: Vitals:   01/22/24 0827 01/22/24 0828  BP: (!) 146/54 (!) 146/54  Pulse: 92 92  Resp: 17 17  Temp: 99.3 F  (37.4 C) 99.3 F (37.4 C)  SpO2: 96% 96%    Intake/Output Summary (Last 24 hours) at 01/22/2024 1005 Last data filed at 01/21/2024 2339 Gross per 24 hour  Intake 796.35 ml  Output --  Net 796.35 ml   Filed Weights   01/18/24 1046  Weight: 88 kg   Weight change:  Body mass index is 29.5 kg/m.   Physical Exam: General exam: Pleasant, elderly African-American female.  Improving. Skin: No rashes, lesions or ulcers. HEENT: Atraumatic, normocephalic, no obvious bleeding Lungs: Clear to auscultation bilaterally,  CVS: S1, S2, no murmur,   GI/Abd: Soft, distended, mild to moderate generalized tenderness.  Bowel sounds sluggish. CNS: Alert, awake, oriented x 3 Psychiatry: Mood appropriate Extremities: No pedal edema, no calf tenderness,   Data Review: I have personally reviewed the laboratory data and studies available.  F/u labs ordered [employer] (  From admission, onward)     Start     Ordered   01/22/24 1400  Basic metabolic panel  Once-Timed,   TIMED        01/22/24 0822            Total time spent in review of labs and imaging, patient evaluation, formulation of plan, documentation and communication with family: 45 minutes  Signed, Lorin Glass, MD [facility] 01/22/2024

## 2024-01-22 NOTE — Progress Notes (Signed)
 Progress Note     Subjective: Had 2 large BM's  Objective: Vital signs in last 24 hours: Temp:  [98.8 F (37.1 C)-99.8 F (37.7 C)] 99.8 F (37.7 C) (03/23 0438) Pulse Rate:  [89] 89 (03/23 0438) Resp:  [17-18] 18 (03/23 0438) BP: (141-157)/(57-81) 145/57 (03/23 0438) SpO2:  [96 %-100 %] 96 % (03/23 0438) Last BM Date : 01/22/24  Intake/Output from previous day: 03/22 0701 - 03/23 0700 In: 796.4 [P.O.:50; I.V.:746.4] Out: -  Intake/Output this shift: No intake/output data recorded.  PE: General: pleasant, WD, elderly female who is laying in bed in NAD Heart: regular, rate, and rhythm.  Lungs: Respiratory effort nonlabored on room air Abd: soft, no distention, NGT with thin clear drainage Psych: A&Ox3 with an appropriate affect.    Lab Results:  Recent Labs    01/21/24 0436 01/22/24 0720  WBC 7.4 7.9  HGB 7.8* 7.5*  HCT 27.3* 25.9*  PLT 227 215   BMET Recent Labs    01/20/24 0636 01/21/24 0436  NA 140 138  K 4.4 3.9  CL 103 108  CO2 23 24  GLUCOSE 127* 109*  BUN 21 13  CREATININE 0.68 0.57  CALCIUM 8.7* 8.2*   PT/INR No results for input(s): "LABPROT", "INR" in the last 72 hours. CMP     Component Value Date/Time   NA 138 01/21/2024 0436   NA 145 06/19/2018 0000   K 3.9 01/21/2024 0436   CL 108 01/21/2024 0436   CO2 24 01/21/2024 0436   GLUCOSE 109 (H) 01/21/2024 0436   BUN 13 01/21/2024 0436   BUN 7 06/19/2018 0000   CREATININE 0.57 01/21/2024 0436   CALCIUM 8.2 (L) 01/21/2024 0436   PROT 7.6 01/18/2024 1055   ALBUMIN 3.7 01/18/2024 1055   AST 17 01/18/2024 1055   ALT 12 01/18/2024 1055   ALKPHOS 47 01/18/2024 1055   BILITOT 0.6 01/18/2024 1055   GFRNONAA >60 01/21/2024 0436   GFRAA >60 12/09/2019 2243   Lipase     Component Value Date/Time   LIPASE 36 01/18/2024 1055       Studies/Results: VAS [place] LOWER EXTREMITY VENOUS (DVT) Result Date: 01/20/2024  Lower Venous DVT Study Patient Name:  BECKA LAGASSE  Date of Exam:    01/20/2024 Medical Rec #: [id number]       Accession #:    [id number] Date of Birth: [birth year]       Patient Gender: F Patient Age:   78 years Exam Location:  [facility] Procedure:      VAS [place] LOWER EXTREMITY VENOUS (DVT) Referring Phys: Lorin Glass --------------------------------------------------------------------------------  Indications: Swelling, and Pain.  Risk Factors: DVT Hx past pregnancy. Anticoagulation: Heparin. Comparison Study: No significant changes seen since previous exam 12/24/21. Performing Technologist: Shona Simpson  Examination Guidelines: A complete evaluation includes B-mode imaging, spectral Doppler, color Doppler, and power Doppler as needed of all accessible portions of each vessel. Bilateral testing is considered an integral part of a complete examination. Limited examinations for reoccurring indications may be performed as noted. The reflux portion of the exam is performed with the patient in reverse Trendelenburg.  +---------+---------------+---------+-----------+----------+--------------+ RIGHT    CompressibilityPhasicitySpontaneityPropertiesThrombus Aging +---------+---------------+---------+-----------+----------+--------------+ CFV      Full           Yes      Yes                                 +---------+---------------+---------+-----------+----------+--------------+  SFJ      Full                                                        +---------+---------------+---------+-----------+----------+--------------+ FV Prox  Full                                                        +---------+---------------+---------+-----------+----------+--------------+ FV Mid   Full                                                        +---------+---------------+---------+-----------+----------+--------------+ FV DistalFull                                                         +---------+---------------+---------+-----------+----------+--------------+ PFV      Full                                                        +---------+---------------+---------+-----------+----------+--------------+ POP      Full           Yes      Yes                                 +---------+---------------+---------+-----------+----------+--------------+ PTV      Full                                                        +---------+---------------+---------+-----------+----------+--------------+ PERO     Full                                                        +---------+---------------+---------+-----------+----------+--------------+   +---------+---------------+---------+-----------+----------+--------------+ LEFT     CompressibilityPhasicitySpontaneityPropertiesThrombus Aging +---------+---------------+---------+-----------+----------+--------------+ CFV      Full           Yes      Yes                                 +---------+---------------+---------+-----------+----------+--------------+ SFJ      Full                                                        +---------+---------------+---------+-----------+----------+--------------+  FV Prox  Full                                                        +---------+---------------+---------+-----------+----------+--------------+ FV Mid   Full                                                        +---------+---------------+---------+-----------+----------+--------------+ FV DistalFull                                                        +---------+---------------+---------+-----------+----------+--------------+ PFV      Full                                                        +---------+---------------+---------+-----------+----------+--------------+ POP      Full           Yes      Yes                                  +---------+---------------+---------+-----------+----------+--------------+ PTV      Full                                                        +---------+---------------+---------+-----------+----------+--------------+ PERO     Full                                                        +---------+---------------+---------+-----------+----------+--------------+     Summary: BILATERAL: - No evidence of deep vein thrombosis seen in the lower extremities, bilaterally. -No evidence of popliteal cyst, bilaterally.   *See table(s) above for measurements and observations. Electronically signed by Lemar Livings MD on 01/20/2024 at 2:18:47 PM.    Final    DG Abd Portable 1V Result Date: 01/20/2024 CLINICAL DATA:  Small bowel obstruction. EXAM: PORTABLE ABDOMEN - 1 VIEW COMPARISON:  One-view abdomen 01/19/2024. FINDINGS: Lung bases are clear. Side port of the NG tube is just beyond the GE junction. Mild gaseous distension of small bowel remains. Gas and stool are present throughout the colon. No residual obstruction is present. Vascular stent is noted. IMPRESSION: 1. Mild gaseous distension of small bowel without residual obstruction. 2. Side port of the NG tube is just beyond the GE junction. Electronically Signed   By: Marin Roberts M.D.   On: 01/20/2024 13:28    Anti-infectives: Anti-infectives (From admission, onward)    None        Assessment/Plan  SBO  - CT 3/19 with distended small bowel extending from proximal through distal jejunum, transition within central abdomen, large amount of retained stool throughout the colon  - SBO protocol - 8 hr film with contrast in SB. 24 hr film contrast in colon - NGT d/c'd yesterday.  Had 2 BM's with suppository - advance diet as tolerated - mobilize as tolerated - will sign off, please call with any changes  FEN: advance diet as tolerated VTE: heparin gtt ID: no current abx  - per TRH -  T2DM HTN GERD RA Hx of DVT on Eliquis   LOS: 4 days   I reviewed hospitalist notes, last 24 h vitals and pain scores, last 48 h intake and output, last 24 h labs and trends, and last 24 h imaging results.  This care required moderate level of medical decision making.    Vanita Panda, MD [facility] 01/22/2024, 7:59 AM Please see Amion for pager number during day hours 7:00am-4:30pm

## 2024-01-22 NOTE — Plan of Care (Signed)
  Problem: Coping: Goal: Ability to adjust to condition or change in health will improve Outcome: Progressing   Problem: Metabolic: Goal: Ability to maintain appropriate glucose levels will improve Outcome: Progressing   Problem: Skin Integrity: Goal: Risk for impaired skin integrity will decrease Outcome: Progressing   Problem: Fluid Volume: Goal: Ability to maintain a balanced intake and output will improve Outcome: Not Progressing

## 2024-01-23 DIAGNOSIS — K56609 Unspecified intestinal obstruction, unspecified as to partial versus complete obstruction: Secondary | ICD-10-CM | POA: Diagnosis not present

## 2024-01-23 LAB — BASIC METABOLIC PANEL
Anion gap: 11 (ref 5–15)
BUN: <5 mg/dL — ABNORMAL LOW (ref 8–23)
CO2: 21 mmol/L — ABNORMAL LOW (ref 22–32)
Calcium: 8.6 mg/dL — ABNORMAL LOW (ref 8.9–10.3)
Chloride: 106 mmol/L (ref 98–111)
Creatinine, Ser: 0.56 mg/dL (ref 0.44–1.00)
GFR, Estimated: >60 mL/min (ref >60)
Glucose, Bld: 91 mg/dL (ref 70–99)
Potassium: 2.8 mmol/L — ABNORMAL LOW (ref 3.5–5.1)
Sodium: 138 mmol/L (ref 135–145)

## 2024-01-23 LAB — CBC
HCT: 29.6 % — ABNORMAL LOW (ref 36.0–46.0)
Hemoglobin: 8.6 g/dL — ABNORMAL LOW (ref 12.0–15.0)
MCH: 20.9 pg — ABNORMAL LOW (ref 26.0–34.0)
MCHC: 29.1 g/dL — ABNORMAL LOW (ref 30.0–36.0)
MCV: 72 fL — ABNORMAL LOW (ref 80.0–100.0)
Platelets: 204 10*3/uL (ref 150–400)
RBC: 4.11 MIL/uL (ref 3.87–5.11)
RDW: 20.3 % — ABNORMAL HIGH (ref 11.5–15.5)
WBC: 10 10*3/uL (ref 4.0–10.5)
nRBC: 0.5 % — ABNORMAL HIGH (ref 0.0–0.2)

## 2024-01-23 LAB — GLUCOSE, CAPILLARY
Glucose-Capillary: 101 mg/dL — ABNORMAL HIGH (ref 70–99)
Glucose-Capillary: 103 mg/dL — ABNORMAL HIGH (ref 70–99)
Glucose-Capillary: 65 mg/dL — ABNORMAL LOW (ref 70–99)
Glucose-Capillary: 79 mg/dL (ref 70–99)
Glucose-Capillary: 83 mg/dL (ref 70–99)
Glucose-Capillary: 86 mg/dL (ref 70–99)

## 2024-01-23 MED ORDER — INSULIN ASPART 100 UNIT/ML IJ SOLN: 0.0000 [IU] | Freq: Every day | INTRAMUSCULAR | Status: DC

## 2024-01-23 MED ORDER — POTASSIUM CHLORIDE 10 MEQ/100ML IV SOLN
10.0000 meq | INTRAVENOUS | Status: AC
  Administered 2024-01-23 (×4): 10 meq via INTRAVENOUS
  Filled 2024-01-23 (×4): qty 100

## 2024-01-23 MED ORDER — POTASSIUM CHLORIDE CRYS ER 20 MEQ PO TBCR
40.0000 meq | EXTENDED_RELEASE_TABLET | ORAL | Status: AC
  Administered 2024-01-23 (×2): 40 meq via ORAL
  Filled 2024-01-23 (×2): qty 2

## 2024-01-23 MED ORDER — INSULIN ASPART 100 UNIT/ML IJ SOLN: 0.0000 [IU] | Freq: Three times a day (TID) | INTRAMUSCULAR | Status: DC

## 2024-01-23 NOTE — Progress Notes (Signed)
   01/23/24 1700  Vitals  Temp 98 F (36.7 C)  Temp Source Oral  BP (!) 157/69  MAP (mmHg) 94  BP Method Automatic  Pulse Rate 75  Pulse Rate Source Monitor  Resp 18  Level of Consciousness  Level of Consciousness Alert  MEWS COLOR  MEWS Score Color Green  Oxygen Therapy  SpO2 100 %  Pain Assessment  Pain Scale 0-10  Pain Score 0  MEWS Score  MEWS Temp 0  MEWS Systolic 0  MEWS Pulse 0  MEWS RR 0  MEWS LOC 0  MEWS Score 0

## 2024-01-23 NOTE — Plan of Care (Signed)
  Problem: Coping: Goal: Ability to adjust to condition or change in health will improve Outcome: Progressing   Problem: Fluid Volume: Goal: Ability to maintain a balanced intake and output will improve Outcome: Progressing   Problem: Health Behavior/Discharge Planning: Goal: Ability to manage health-related needs will improve Outcome: Progressing   Problem: Metabolic: Goal: Ability to maintain appropriate glucose levels will improve Outcome: Progressing

## 2024-01-23 NOTE — Progress Notes (Signed)
 PROGRESS NOTE  Diane [name]  DOB: 10/10/1946  PCP: Georgianne Fick, MD FAO:[id number]  DOA: 01/18/2024  LOS: 5 days  Hospital Day: 6  Brief narrative: Diane [name] is a 78 y.o. female with PMH significant for DM2, HTN, HLD, prolonged QTc, multiple abdominal surgeries in the past including abdominal hysterectomy, hernia repair, h/o bowel obstructions which improved with conservative management. 3/10, patient presented to the ED with complaint of generalized abdominal pain, cramping, nausea, vomiting for 2 days.  In the ED, hemodynamically stable Labs showed WBC count normal, hemoglobin low at 9.4 with microcytosis, electrolytes normal CT abdomen pelvis was obtained which showed -Distended small bowel extending from the proximal through the distal jejunum, with formed stool seen within the distended distal jejunum. Transition point within the central abdomen with decompressed terminal ileum beyond this point, compatible with small-bowel obstruction. -Large amount of retained stool throughout the colon, increased since prior study, consistent with constipation.  Patient was seen by general surgery. Started on conservative management.  NG tube inserted Ad[facility] to [facility] improving  Subjective: Patient was seen and examined this morning. Sitting up at the edge of the bed. Feels better but feels embarrassed that she had multiple bowel movements last night, soiled her bed which had to be cleaned multiple times. Also had fever of 101 yesterday afternoon and 100.9 last night.   Assessment and plan: Small bowel obstruction -resolved Presented with abdominal pain, nausea vomiting for 2 days. Imaging findings as above. Improved with conservative management.  Tolerated regular diet this morning.  Large stool burden CT abdomen showed formed stool within the distal jejunum as well as throughout the colon. Constipation could very well be the reason for bowel obstruction.   Patient was started on twice daily MiraLAX.  Started having bowel movement and now had multiple bowel movements overnight. Will hold off further MiraLAX today but plan to resume tomorrow again.  Fever Patient had fever of 101 yesterday afternoon and 100.9 last night. Unclear etiology WBC count normal until yesterday.  Pending labs today Patient is very concerned because she says she never had fever since 1985! Continue to monitor for next 24 hours Recent Labs  Lab 01/18/24 1055 01/19/24 0649 01/20/24 0636 01/21/24 0436 01/22/24 0720  WBC 9.4 7.9 7.6 7.4 7.9   Hypokalemia Potassium level remains significantly low, likely because of multiple bowel movements.  IV and oral replacements ordered. Repeat labs this afternoon Recent Labs  Lab 01/20/24 0636 01/21/24 0436 01/22/24 0720 01/22/24 1401 01/23/24 0851  K 4.4 3.9 2.5* 3.1* 2.8*  MG 2.1  --   --   --   --   PHOS 5.5*  --   --   --   --    Acute on chronic microcytic anemia Severe iron deficiency Hemoglobin chronically low, baseline above 9. Hemoglobin dropped and is stabilized between 7 and 8.  No active bleeding. Ferritin level was low at 8 suggestive of severe iron deficiency and probably chronic GI loss while on Eliquis. Anticoagulation currently on hold. Given IV Venofer on 3/21 and 3/23. Manage GI intervention postdischarge.  Had seen Dr. Elnoria Howard in the past. Recent Labs    01/18/24 1055 01/19/24 0649 01/20/24 0636 01/21/24 0436 01/22/24 0720  HGB 9.4* 8.7* 8.1* 7.8* 7.5*  MCV 71.8* 68.7* 69.4* 70.7* 69.8*  VITAMINB12  --   --  305  --   --   FOLATE  --   --  14.4  --   --   FERRITIN  --   --  8*  --   --   TIBC  --   --  379  --   --   IRON  --   --  18*  --   --   RETICCTPCT  --   --  1.4  --   --    H/o DVT Had recurrence of DVT in 2022 and hence on lifelong Eliquis  chronically anticoagulated on Eliquis.  Switched to IV heparin drip on admission Ultrasound duplex does not show any evidence of  DVT. Given low hemoglobin and low ferritin, heparin drip was held. If hemoglobin is stable, plan to resume Eliquis at discharge  Recurrent epistaxis Patient reports recent recurrent epistaxis.  Was seen by ENT as an outpatient.  Patient reports she has a plan for some kind of cauterization next week. Currently no evidence of active bleeding..  Type 2 diabetes mellitus Hypoglycemia A1c 6.8 on 01/19/2024 PTA meds-none.   Had episodes of low blood sugar in the hospital due to n.p.o. status.   Blood sugar level now stable. Recent Labs  Lab 01/22/24 1609 01/22/24 2058 01/23/24 0007 01/23/24 0413 01/23/24 0828  GLUCAP 79 91 101* 86 83   Hypertension PTA meds- Lasix, amlodipine oral meds on hold Hydralazine as needed  Hyperlipidemia Statin to resume at discharge  Autoimmune skin disease  Rheumatoid arthritis PTA meds- methotrexate, folic acid to resume at discharge  H/o prolonged QTc  EKG 3/20 with QTc 466 ms.  Monitor electrolytes   Mobility: Ambulating on the hallway independently  Goals of care   Code Status: Full Code     DVT prophylaxis:  enoxaparin (LOVENOX) injection 40 mg Start: 01/20/24 1030 SCDs Start: 01/18/24 2335   Antimicrobials: None Fluid: Not on IV fluid Consultants: General surgery Family Communication: Family not at bedside  Status: Inpatient Level of care:  Med-Surg   Patient is from: Home Needs to continue in-hospital care: Bowel obstruction improved.  Had fever since yesterday. Anticipated d/c to: Plan to DC home tomorrow if fever improves   Diet:  Diet Order             Diet regular Room service appropriate? Yes; Fluid consistency: Thin  Diet effective now                   Scheduled Meds:  enoxaparin (LOVENOX) injection  40 mg Subcutaneous Daily   insulin aspart  0-15 Units Subcutaneous Q4H   potassium chloride  40 mEq Oral Q2H    PRN meds: acetaminophen **OR** acetaminophen, hydrALAZINE, HYDROmorphone (DILAUDID)  injection, melatonin, prochlorperazine, sodium chloride   Infusions:   potassium chloride 10 mEq (01/23/24 1032)    Antimicrobials: Anti-infectives (From admission, onward)    None       Objective: Vitals:   01/23/24 0408 01/23/24 0827  BP: (!) 170/72 (!) 162/76  Pulse: 81 84  Resp: 17 17  Temp: 99.2 F (37.3 C) 98.9 F (37.2 C)  SpO2: 98% 99%    Intake/Output Summary (Last 24 hours) at 01/23/2024 1130 Last data filed at 01/23/2024 0600 Gross per 24 hour  Intake 320 ml  Output --  Net 320 ml   Filed Weights   01/18/24 1046  Weight: 88 kg   Weight change:  Body mass index is 29.5 kg/m.   Physical Exam: General exam: Pleasant, elderly African-American female.  Improving. Skin: No rashes, lesions or ulcers. HEENT: Atraumatic, normocephalic, no obvious bleeding Lungs: Clear to auscultation bilaterally,  CVS: S1, S2, no murmur,   GI/Abd: Soft, distended,  mild to moderate generalized tenderness.  Bowel sounds sluggish. CNS: Alert, awake, oriented x 3 Psychiatry: Mood appropriate Extremities: No pedal edema, no calf tenderness,   Data Review: I have personally reviewed the laboratory data and studies available.  F/u labs ordered Unresulted Labs (From admission, onward)     Start     Ordered   01/24/24 0500  Basic metabolic panel  Tomorrow morning,   R        01/23/24 1009   01/24/24 0500  CBC with Differential/Platelet  Tomorrow morning,   R        01/23/24 1009   01/23/24 1128  CBC  ONCE - STAT,   STAT        01/23/24 1127            Total time spent in review of labs and imaging, patient evaluation, formulation of plan, documentation and communication with family: 45 minutes  Signed, Lorin Glass, MD [facility] 01/23/2024

## 2024-01-23 NOTE — Progress Notes (Signed)
 Lab called stating that they would be re-collecting the patients HGB due to it going up yesterday with out her being transfused.

## 2024-01-23 NOTE — Inpatient Diabetes Management (Signed)
 Inpatient Diabetes Program Recommendations  AACE/ADA: New Consensus Statement on Inpatient Glycemic Control (2015)  Target Ranges:  Prepandial:   less than 140 mg/dL      Peak postprandial:   less than 180 mg/dL (1-2 hours)      Critically ill patients:  140 - 180 mg/dL   Lab Results  Component Value Date   GLUCAP 65 (L) 01/23/2024   HGBA1C 6.8 (H) 01/19/2024    Review of Glycemic Control  Latest Reference Range & Units 01/23/24 00:07 01/23/24 04:13 01/23/24 08:28 01/23/24 12:43  Glucose-Capillary 70 - 99 mg/dL 161 (H) 86 83 65 (L)   Diabetes history: DM  Outpatient Diabetes medications:  None Current orders for Inpatient glycemic control:  Novolog 0-15 units q 4 hours  Inpatient Diabetes Program Recommendations:    Please consider reducing Novolog correction to very sensitive (0-6 units) tid with meals.   Thanks,  Lorenza Cambridge, RN, BC-ADM Inpatient Diabetes Coordinator Pager [phone]  (8a-5p)

## 2024-01-24 ENCOUNTER — Other Ambulatory Visit (HOSPITAL_COMMUNITY): Payer: Self-pay

## 2024-01-24 ENCOUNTER — Encounter (HOSPITAL_COMMUNITY)

## 2024-01-24 ENCOUNTER — Inpatient Hospital Stay (HOSPITAL_COMMUNITY): Admission: RE | Admit: 2024-01-24 | Source: Ambulatory Visit

## 2024-01-24 ENCOUNTER — Ambulatory Visit (HOSPITAL_COMMUNITY)

## 2024-01-24 ENCOUNTER — Telehealth (HOSPITAL_COMMUNITY): Payer: Self-pay | Admitting: Pharmacy Technician

## 2024-01-24 DIAGNOSIS — K56609 Unspecified intestinal obstruction, unspecified as to partial versus complete obstruction: Secondary | ICD-10-CM | POA: Diagnosis not present

## 2024-01-24 LAB — CBC WITH DIFFERENTIAL/PLATELET
Abs Immature Granulocytes: 0.08 10*3/uL — ABNORMAL HIGH (ref 0.00–0.07)
Basophils Absolute: 0 10*3/uL (ref 0.0–0.1)
Basophils Relative: 0 %
Eosinophils Absolute: 0.2 10*3/uL (ref 0.0–0.5)
Eosinophils Relative: 3 %
HCT: 27.6 % — ABNORMAL LOW (ref 36.0–46.0)
Hemoglobin: 8.1 g/dL — ABNORMAL LOW (ref 12.0–15.0)
Immature Granulocytes: 1 %
Lymphocytes Relative: 20 %
Lymphs Abs: 1.5 10*3/uL (ref 0.7–4.0)
MCH: 21.1 pg — ABNORMAL LOW (ref 26.0–34.0)
MCHC: 29.3 g/dL — ABNORMAL LOW (ref 30.0–36.0)
MCV: 71.9 fL — ABNORMAL LOW (ref 80.0–100.0)
Monocytes Absolute: 0.8 10*3/uL (ref 0.1–1.0)
Monocytes Relative: 11 %
Neutro Abs: 4.9 10*3/uL (ref 1.7–7.7)
Neutrophils Relative %: 65 %
Platelets: 199 10*3/uL (ref 150–400)
RBC: 3.84 MIL/uL — ABNORMAL LOW (ref 3.87–5.11)
RDW: 21 % — ABNORMAL HIGH (ref 11.5–15.5)
WBC: 7.5 10*3/uL (ref 4.0–10.5)
nRBC: 0.4 % — ABNORMAL HIGH (ref 0.0–0.2)

## 2024-01-24 LAB — BASIC METABOLIC PANEL
Anion gap: 9 (ref 5–15)
BUN: <5 mg/dL — ABNORMAL LOW (ref 8–23)
CO2: 21 mmol/L — ABNORMAL LOW (ref 22–32)
Calcium: 8.4 mg/dL — ABNORMAL LOW (ref 8.9–10.3)
Chloride: 111 mmol/L (ref 98–111)
Creatinine, Ser: 0.53 mg/dL (ref 0.44–1.00)
GFR, Estimated: >60 mL/min (ref >60)
Glucose, Bld: 90 mg/dL (ref 70–99)
Potassium: 3.1 mmol/L — ABNORMAL LOW (ref 3.5–5.1)
Sodium: 141 mmol/L (ref 135–145)

## 2024-01-24 LAB — GLUCOSE, CAPILLARY
Glucose-Capillary: 84 mg/dL (ref 70–99)
Glucose-Capillary: 104 mg/dL — ABNORMAL HIGH (ref 70–99)

## 2024-01-24 MED ORDER — FERROUS SULFATE 325 (65 FE) MG PO TABS
325.0000 mg | ORAL_TABLET | Freq: Every day | ORAL | 0 refills | Status: AC
  Filled 2024-01-24: qty 90, 90d supply, fill #0

## 2024-01-24 MED ORDER — POTASSIUM CHLORIDE CRYS ER 20 MEQ PO TBCR
20.0000 meq | EXTENDED_RELEASE_TABLET | Freq: Once | ORAL | 0 refills | Status: AC
  Filled 2024-01-24: qty 1, 1d supply, fill #0

## 2024-01-24 MED ORDER — SENNOSIDES-DOCUSATE SODIUM 8.6-50 MG PO TABS
1.0000 | ORAL_TABLET | Freq: Every day | ORAL | 0 refills | Status: AC
Start: 2024-01-24 — End: ?
  Filled 2024-01-24: qty 30, 30d supply, fill #0

## 2024-01-24 MED ORDER — POTASSIUM CHLORIDE CRYS ER 20 MEQ PO TBCR
40.0000 meq | EXTENDED_RELEASE_TABLET | Freq: Once | ORAL | Status: AC
  Administered 2024-01-24: 40 meq via ORAL
  Filled 2024-01-24: qty 2

## 2024-01-24 MED ORDER — POLYETHYLENE GLYCOL 3350 17 GM/SCOOP PO POWD
17.0000 g | Freq: Every day | ORAL | 0 refills | Status: AC | PRN
  Filled 2024-01-24: qty 238, 14d supply, fill #0

## 2024-01-24 NOTE — Progress Notes (Signed)
 Reviewed AVS, patient expressed understanding of medications, MD follow up reviewed.   Patient states all belongings brought to the hospital at time of admission are accounted for and packed to take home.  Picked up medications from [facility] pharmacy. Pt transported to Discharge lounge to wait for Dtr to transport home.

## 2024-01-24 NOTE — Progress Notes (Signed)
 Patient has been discharged per MD order. IV has been removed, tolerated well. Patient has no complaints of pain at this time. Discharge instructions will be given by discharge nurse. Bed in lowest position, call light within reach.

## 2024-01-24 NOTE — Discharge Instructions (Signed)

## 2024-01-24 NOTE — Telephone Encounter (Signed)
 Patient Product/process development scientist completed.    The patient is insured through [employer]/ [employer]. Patient has Medicare and is not eligible for a copay card, but may be able to apply for patient assistance or Medicare RX Payment Plan (Patient Must reach out to their plan, if eligible for payment plan), if available.    Ran test claim for enoxaparin (Lovenox) 100 mg/ml and the current 30 day co-pay is $100.00.   This test claim was processed through [facility]- copay amounts may vary at other pharmacies due to pharmacy/plan contracts, or as the patient moves through the different stages of their insurance plan.     Diane [name], CPHT Pharmacy Technician III Certified Patient Advocate [facility] Patient Advocate Team Direct Number: [phone]  Fax: [phone]

## 2024-01-24 NOTE — TOC CM/SW Note (Signed)
 Transition of Care [facility]) - Inpatient Brief Assessment   Patient Details  Name: Diane [name] MRN: [id number] Date of Birth: February 28, 1946  Transition of Care [facility]) CM/SW Contact:    Mearl Latin, LCSW Phone Number: 01/24/2024, 9:29 AM   Clinical Narrative: Patient admitted from home with small bowel obstruction. SDOH flags reviewed and community resources provided. TOC following for needs.    Transition of Care Asessment: Insurance and Status: Insurance coverage has been reviewed Patient has primary care physician: Yes Home environment has been reviewed: From home Prior level of function:: Independent Prior/Current Home Services: No current home services Social Drivers of Health Review: SDOH reviewed interventions complete Readmission risk has been reviewed: Yes Transition of care needs: transition of care needs identified, TOC will continue to follow

## 2024-01-24 NOTE — Plan of Care (Signed)
  Problem: Education: Goal: Ability to describe self-care measures that may prevent or decrease complications (Diabetes Survival Skills Education) will improve Outcome: Progressing   Problem: Coping: Goal: Ability to adjust to condition or change in health will improve Outcome: Progressing   Problem: Education: Goal: Knowledge of General Education information will improve Description: Including pain rating scale, medication(s)/side effects and non-pharmacologic comfort measures Outcome: Progressing   Problem: Health Behavior/Discharge Planning: Goal: Ability to manage health-related needs will improve Outcome: Progressing

## 2024-01-24 NOTE — Discharge Summary (Signed)
 Physician Discharge Summary  Diane [name] ZOX:[id number] DOB: [birth year] DOA: 01/18/2024  PCP: Georgianne Fick, MD  Admit date: 01/18/2024 Discharge date: 01/24/2024  Admitted From: Home Discharge disposition: Home  Recommendations at discharge:  Bowel regimen -take scheduled Senokot nightly and as needed MiraLAX to avoid constipation Continue Eliquis as before for history of recurrent DVT Your hemoglobin and iron level are low.  You had been started on iron pills.  You need to make a follow-up appointment with your  gastroenterologist. To minimize the risk of bleeding, stop aspirin while on Eliquis. If potassium level is running low.  Continue potassium supplement pill for 5 days.   Brief narrative: CORTLYNN HOLLINSWORTH is a 78 y.o. female with PMH significant for DM2, HTN, HLD, prolonged QTc, multiple abdominal surgeries in the past including abdominal hysterectomy, hernia repair, h/o bowel obstructions which improved with conservative management. 3/10, patient presented to the ED with complaint of generalized abdominal pain, cramping, nausea, vomiting for 2 days.  In the ED, hemodynamically stable Labs showed WBC count normal, hemoglobin low at 9.4 with microcytosis, electrolytes normal CT abdomen pelvis was obtained which showed -Distended small bowel extending from the proximal through the distal jejunum, with formed stool seen within the distended distal jejunum. Transition point within the central abdomen with decompressed terminal ileum beyond this point, compatible with small-bowel obstruction. -Large amount of retained stool throughout the colon, increased since prior study, consistent with constipation.  Patient was seen by general surgery. Started on conservative management.  NG tube inserted Admitted to [facility] Improved with conservative management See below for details  Subjective: Patient was seen and examined this morning. Sitting up at the edge of the bed. Not in  distress.  Had few bowel movements yesterday again.  Not on scheduled MiraLAX now. No recurrence of fever. Feels confident enough to go home today.  Hospital course: Small bowel obstruction -resolved Large stool burden Presented with abdominal pain, nausea vomiting for 2 days. CT abdomen showed distended small bowel extending from the proximal to the distal jejunum, formed stool within the distal jejunum as well as throughout the colon.  She will constipation was likely due to bowel obstruction. Patient was started on conservative management with IV fluid, bowel rest, NG tube suction.  She was also given enema and MiraLAX.  She had multiple bowel movements after which obstruction resolved.  NGT was removed.  Diet was started and advanced.  Able to tolerate regular diet.  Post discharge, recommend bowel regimen to target bowel movement.  Start scheduled Senokot and as needed MiraLAX  Fever Patient had 1 episode of fever of 101 on 3/23.  She was monitored for any infection.  She did not have recurrence of fever.  WBC count remained stable. She was not tried on any antibiotics No need to workup further Recent Labs  Lab 01/20/24 0636 01/21/24 0436 01/22/24 0720 01/23/24 1135 01/24/24 0640  WBC 7.6 7.4 7.9 10.0 7.5   Hypokalemia Potassium level has been running low in the hospital after she had multiple bowel movements.  Adequately replaced.  Given replacement this morning as well.  Discharge on 5 days of oral KCl 20 mill equivalent Recent Labs  Lab 01/20/24 0636 01/21/24 0436 01/22/24 0720 01/22/24 1401 01/23/24 0851 01/24/24 0640  K 4.4 3.9 2.5* 3.1* 2.8* 3.1*  MG 2.1  --   --   --   --   --   PHOS 5.5*  --   --   --   --   --  Acute on chronic microcytic anemia Severe iron deficiency Hemoglobin chronically low and now stable between 8 and 9. No active bleeding. Ferritin level was low at 8 suggestive of severe iron deficiency and probably chronic GI loss while on  Eliquis. Given IV Venofer on 3/21 and 3/23.  Discharge on oral iron pills. Patient reports she had seen Dr. Elnoria Howard in the past. Recommend outpatient follow-up. Recent Labs    01/20/24 0636 01/21/24 0436 01/22/24 0720 01/23/24 1135 01/24/24 0640  HGB 8.1* 7.8* 7.5* 8.6* 8.1*  MCV 69.4* 70.7* 69.8* 72.0* 71.9*  VITAMINB12 305  --   --   --   --   FOLATE 14.4  --   --   --   --   FERRITIN 8*  --   --   --   --   TIBC 379  --   --   --   --   IRON 18*  --   --   --   --   RETICCTPCT 1.4  --   --   --   --    H/o DVT Had recurrence of DVT in 2022 and hence on lifelong Eliquis  chronically anticoagulated on Eliquis.   While inpatient, it was switched to IV heparin. Hemoglobin level is monitored.  Remained stable. Switch back to Eliquis at discharge.  Recurrent epistaxis Patient reports recent recurrent epistaxis.  Was seen by ENT as an outpatient.  Patient reports she has a plan for some kind of cauterization next week. Currently no evidence of active bleeding..  Type 2 diabetes mellitus Hypoglycemia A1c 6.8 on 01/19/2024 PTA meds-none.   Had episodes of low blood sugar in the hospital due to n.p.o. status.   Blood sugar level now stable. Recent Labs  Lab 01/23/24 1243 01/23/24 1704 01/23/24 2054 01/24/24 0758 01/24/24 0924  GLUCAP 65* 79 103* 84 104*   Hypertension PTA meds- Lasix, amlodipine Resume at home.  Hyperlipidemia Statin to resume at discharge  Autoimmune skin disease  Rheumatoid arthritis PTA meds- methotrexate, folic acid to resume at discharge  H/o prolonged QTc  EKG 3/20 with QTc 466 ms.     Mobility: Ambulating on the hallway independently  Goals of care   Code Status: Full Code   Consultants: General surgery Family Communication: Family not at bedside  Diet:  Diet Order             Diet general           Diet regular Room service appropriate? Yes; Fluid consistency: Thin  Diet effective now                   Nutritional  status:  Body mass index is 29.5 kg/m.       Wounds:  -    Discharge Exam:   Vitals:   01/23/24 1700 01/23/24 2056 01/24/24 0412 01/24/24 0759  BP: (!) 157/69 131/62 (!) 159/76 (!) 146/66  Pulse: 75 85 73 77  Resp: 18  18 17   Temp: 98 F (36.7 C) 99.3 F (37.4 C) 98.6 F (37 C) 98.1 F (36.7 C)  TempSrc: Oral Oral Oral   SpO2: 100%  100% 98%  Weight:      Height:        Body mass index is 29.5 kg/m.   General exam: Pleasant, elderly African-American female.  Improving. Skin: No rashes, lesions or ulcers. HEENT: Atraumatic, normocephalic, no obvious bleeding Lungs: Clear to auscultation bilaterally,  CVS: S1, S2, no murmur,   GI/Abd:  Soft, distended, mild to moderate generalized tenderness.  Bowel sounds sluggish. CNS: Alert, awake, oriented x 3 Psychiatry: Mood appropriate Extremities: No pedal edema, no calf tenderness,  Follow ups:    Follow-up Information     Georgianne Fick, MD Follow up.   Specialty: Internal Medicine Contact information: [address] [place] [address] [place] [address] [phone]                 Discharge Instructions:   Discharge Instructions     Call MD for:  difficulty breathing, headache or visual disturbances   Complete by: As directed    Call MD for:  extreme fatigue   Complete by: As directed    Call MD for:  hives   Complete by: As directed    Call MD for:  persistant dizziness or light-headedness   Complete by: As directed    Call MD for:  persistant nausea and vomiting   Complete by: As directed    Call MD for:  severe uncontrolled pain   Complete by: As directed    Call MD for:  temperature >100.4   Complete by: As directed    Diet general   Complete by: As directed    Discharge instructions   Complete by: As directed    Recommendations at discharge:   Bowel regimen -take scheduled Senokot nightly and as needed MiraLAX to avoid constipation  Continue Eliquis as before for history of  recurrent DVT  Your hemoglobin and iron level are low.  You had been started on iron pills.  You need to make a follow-up appointment with your  gastroenterologist.  To minimize the risk of bleeding, stop aspirin while on Eliquis.  If potassium level is running low.  Continue potassium supplement pill for 5 days.   General discharge instructions: Follow with Primary MD Georgianne Fick, MD in 7 days  Please request your PCP  to go over your hospital tests, procedures, radiology results at the follow up. Please get your medicines reviewed and adjusted.  Your PCP may decide to repeat certain labs or tests as needed. Do not drive, operate heavy machinery, perform activities at heights, swimming or participation in water activities or provide baby sitting services if your were admitted for syncope or siezures until you have seen by Primary MD or a Neurologist and advised to do so again. [place] Controlled Substance Reporting System database was reviewed. Do not drive, operate heavy machinery, perform activities at heights, swim, participate in water activities or provide baby-sitting services while on medications for pain, sleep and mood until your outpatient physician has reevaluated you and advised to do so again.  You are strongly recommended to comply with the dose, frequency and duration of prescribed medications. Activity: As tolerated with Full fall precautions use walker/cane & assistance as needed Avoid using any recreational substances like cigarette, tobacco, alcohol, or non-prescribed drug. If you experience worsening of your admission symptoms, develop shortness of breath, life threatening emergency, suicidal or homicidal thoughts you must seek medical attention immediately by calling 911 or calling your MD immediately  if symptoms less severe. You must read complete instructions/literature along with all the possible adverse reactions/side effects for all the medicines you take  and that have been prescribed to you. Take any new medicine only after you have completely understood and accepted all the possible adverse reactions/side effects.  Wear Seat belts while driving. You were cared for by a hospitalist during your hospital stay. If you have any questions about  your discharge medications or the care you received while you were in the hospital after you are discharged, you can call the unit and ask to speak with the hospitalist or the covering physician. Once you are discharged, your primary care physician will handle any further medical issues. Please note that NO REFILLS for any discharge medications will be authorized once you are discharged, as it is imperative that you return to your primary care physician (or establish a relationship with a primary care physician if you do not have one).   Increase activity slowly   Complete by: As directed        Discharge Medications:   Allergies as of 01/24/2024       Reactions   Cephalexin Other (See Comments)   URINARY RETENTION = "blocked my urine"   Tapentadol    Other reaction(s): zombie Other reaction(s): zombie   Codeine Hives, Rash   Grass Pollen([id number]-o-[id number]-t-swt Vern) Rash   Nucynta [tapentadol Hcl] Other (See Comments)   Altered mental status        Medication List     STOP taking these medications    aspirin EC 81 MG tablet   atorvastatin 20 MG tablet Commonly known as: LIPITOR   metFORMIN 500 MG 24 hr tablet Commonly known as: GLUCOPHAGE-XR       TAKE these medications    amLODipine 5 MG tablet Commonly known as: NORVASC Take 1 tablet (5 mg total) by mouth daily.   apixaban 5 MG Tabs tablet Commonly known as: ELIQUIS Take 5 mg by mouth 2 (two) times daily.   brompheniramine-pseudoephedrine-DM 30-2-10 MG/5ML syrup Take 5 mLs by mouth 3 (three) times daily as needed.   cyanocobalamin 1000 MCG tablet Take 1,000 mcg by mouth daily.   famotidine 20 MG tablet Commonly known as:  PEPCID Take 20 mg by mouth at bedtime.   ferrous sulfate 325 (65 FE) MG EC tablet Take 1 tablet (325 mg total) by mouth daily with breakfast.   furosemide 20 MG tablet Commonly known as: LASIX Take 20 mg by mouth daily.   gabapentin 300 MG capsule Commonly known as: NEURONTIN Take 300 mg in the morning and 600 mg at night.  Take additional 300 mg as needed for severe pain   oxybutynin 5 MG 24 hr tablet Commonly known as: DITROPAN-XL TAKE 1 TABLET BY MOUTH ONCE DAILY FOR OVERACTIVE BLADDER   oxymetazoline 0.05 % nasal spray Commonly known as: Afrin Nasal Spray Place 1 spray into both nostrils 2 (two) times daily.   pantoprazole 40 MG tablet Commonly known as: PROTONIX Take 40 mg by mouth every morning.   polyethylene glycol 17 g packet Commonly known as: MiraLax Take 17 g by mouth daily as needed for up to 14 days for severe constipation or moderate constipation.   potassium chloride SA 20 MEQ tablet Commonly known as: KLOR-CON M Take 1 tablet (20 mEq total) by mouth once for 1 dose.   rosuvastatin 10 MG tablet Commonly known as: CRESTOR Take 1 tablet (10 mg total) by mouth daily.   senna-docusate 8.6-50 MG tablet Commonly known as: Senokot-S Take 1 tablet by mouth at bedtime.         The results of significant diagnostics from this hospitalization (including imaging, microbiology, ancillary and laboratory) are listed below for reference.    Procedures and Diagnostic Studies:   DG Abd Portable 1V-Small Bowel Obstruction Protocol-initial, 8 hr delay Result Date: 01/19/2024 CLINICAL DATA:  Small-bowel obstruction.  8 hour delayed film EXAM: PORTABLE ABDOMEN -  1 VIEW COMPARISON:  CT 01/18/2024. Limited upper abdominal x-ray 01/19/2024 FINDINGS: Left-sided iliac vein vascular stent identified. Enteric tube along the stomach. Surgical clips in the right upper quadrant of the abdomen. Contrast in the urinary bladder from prior CT. Persistent dilated loops of small bowel  the mid abdomen. Diameters are seen approaching up to 4 cm. There is contrast along several loops of small bowel. No clear colonic contrast at this time. Prominent colonic stool identified. No obvious free air on these portable supine radiographs. Degenerative changes of the spine and pelvis. IMPRESSION: Contrast seen along dilated loops of small bowel diffusely. No clear colonic contrast at this time. Please correlate clinical presentation. Additional follow-up. Electronically Signed   By: Karen Kays M.D.   On: 01/19/2024 15:01   DG Abdomen 1 View Result Date: 01/19/2024 CLINICAL DATA:  NG tube placement EXAM: ABDOMEN - 1 VIEW COMPARISON:  CT earlier today FINDINGS: NG tube tip is in the mid stomach. Dilated bowel noted in the upper abdomen. IMPRESSION: NG tube in the mid stomach. Electronically Signed   By: Charlett Nose M.D.   On: 01/19/2024 01:34   CT ABDOMEN PELVIS W CONTRAST Result Date: 01/18/2024 CLINICAL DATA:  Generalized abdominal pain and cramping, nausea and vomiting EXAM: CT ABDOMEN AND PELVIS WITH CONTRAST TECHNIQUE: Multidetector CT imaging of the abdomen and pelvis was performed using the standard protocol following bolus administration of intravenous contrast. RADIATION DOSE REDUCTION: This exam was performed according to the departmental dose-optimization program which includes automated exposure control, adjustment of the mA and/or kV according to patient size and/or use of iterative reconstruction technique. CONTRAST:  75mL OMNIPAQUE IOHEXOL 350 MG/ML SOLN COMPARISON:  09/17/2023 FINDINGS: Lower chest: No acute pleural or parenchymal lung disease. Hepatobiliary: No focal liver abnormality is seen. Status post cholecystectomy. No biliary dilatation. Pancreas: Unremarkable. No pancreatic ductal dilatation or surrounding inflammatory changes. Spleen: Normal in size without focal abnormality. Adrenals/Urinary Tract: Adrenal glands are unremarkable. Kidneys are normal, without renal calculi,  focal lesion, or hydronephrosis. Bladder is unremarkable. Stomach/Bowel: There is a large amount of retained stool throughout the colon consistent with constipation. Increased fecal burden since prior study. There is prominent dilation of the mid to distal jejunum, with fecalization within the distended small bowel loops which measure up to 5 cm in diameter. There is a transition point within the central abdomen, reference image 62/3, with normal caliber of the ileum beyond this point. Findings are concerning for early or intermittent small bowel obstruction. No bowel wall thickening or inflammatory change.  Normal appendix. Vascular/Lymphatic: Aortic atherosclerosis. Stable aortic atherosclerosis. Stable stent extending from the left common iliac through the left external iliac vein. No pathologic adenopathy. Reproductive: Status post hysterectomy. No adnexal masses. Other: Trace pelvic free fluid. No free intraperitoneal gas. No abdominal wall hernia. Musculoskeletal: No acute or destructive bony abnormalities. Reconstructed images demonstrate no additional findings. IMPRESSION: 1. Distended small bowel extending from the proximal through the distal jejunum, with formed stool seen within the distended distal jejunum. Transition point within the central abdomen with decompressed terminal ileum beyond this point, compatible with small-bowel obstruction. 2. Large amount of retained stool throughout the colon, increased since prior study, consistent with constipation. 3. Trace pelvic free fluid. 4.  Aortic Atherosclerosis (ICD10-I70.0). Electronically Signed   By: Sharlet Salina M.D.   On: 01/18/2024 22:28     Labs:   Basic Metabolic Panel: Recent Labs  Lab 01/20/24 0636 01/21/24 0436 01/22/24 0720 01/22/24 1401 01/23/24 0851 01/24/24 0640  NA 140  138 141 139 138 141  K 4.4 3.9 2.5* 3.1* 2.8* 3.1*  CL 103 108 112* 108 106 111  CO2 23 24 21* 22 21* 21*  GLUCOSE 127* 109* 119* 130* 91 90  BUN 21 13  7* 6* <5* <5*  CREATININE 0.68 0.57 0.51 0.57 0.56 0.53  CALCIUM 8.7* 8.2* 8.0* 8.6* 8.6* 8.4*  MG 2.1  --   --   --   --   --   PHOS 5.5*  --   --   --   --   --    GFR Estimated Creatinine Clearance: 68.3 mL/min (by C-G formula based on SCr of 0.53 mg/dL). Liver Function Tests: Recent Labs  Lab 01/18/24 1055  AST 17  ALT 12  ALKPHOS 47  BILITOT 0.6  PROT 7.6  ALBUMIN 3.7   Recent Labs  Lab 01/18/24 1055  LIPASE 36   No results for input(s): "AMMONIA" in the last 168 hours. Coagulation profile No results for input(s): "INR", "PROTIME" in the last 168 hours.  CBC: Recent Labs  Lab 01/19/24 0649 01/20/24 0636 01/21/24 0436 01/22/24 0720 01/23/24 1135 01/24/24 0640  WBC 7.9 7.6 7.4 7.9 10.0 7.5  NEUTROABS 4.6 4.6 5.2 6.4  --  4.9  HGB 8.7* 8.1* 7.8* 7.5* 8.6* 8.1*  HCT 29.9* 27.4* 27.3* 25.9* 29.6* 27.6*  MCV 68.7* 69.4* 70.7* 69.8* 72.0* 71.9*  PLT 278 238 227 215 204 199   Cardiac Enzymes: No results for input(s): "CKTOTAL", "CKMB", "CKMBINDEX", "TROPONINI" in the last 168 hours. BNP: Invalid input(s): "POCBNP" CBG: Recent Labs  Lab 01/23/24 1243 01/23/24 1704 01/23/24 2054 01/24/24 0758 01/24/24 0924  GLUCAP 65* 79 103* 84 104*   D-Dimer No results for input(s): "DDIMER" in the last 72 hours. Hgb A1c No results for input(s): "HGBA1C" in the last 72 hours. Lipid Profile No results for input(s): "CHOL", "HDL", "LDLCALC", "TRIG", "CHOLHDL", "LDLDIRECT" in the last 72 hours. Thyroid function studies No results for input(s): "TSH", "T4TOTAL", "T3FREE", "THYROIDAB" in the last 72 hours.  Invalid input(s): "FREET3" Anemia work up No results for input(s): "VITAMINB12", "FOLATE", "FERRITIN", "TIBC", "IRON", "RETICCTPCT" in the last 72 hours. Microbiology No results found for this or any previous visit (from the past 240 hours).  Time coordinating discharge: 45 minutes  Signed: Rosser Collington  [facility] 01/24/2024, 9:36 AM

## 2024-02-01 DIAGNOSIS — R5383 Other fatigue: Secondary | ICD-10-CM | POA: Diagnosis not present

## 2024-02-01 DIAGNOSIS — E1165 Type 2 diabetes mellitus with hyperglycemia: Secondary | ICD-10-CM | POA: Diagnosis not present

## 2024-02-01 DIAGNOSIS — D500 Iron deficiency anemia secondary to blood loss (chronic): Secondary | ICD-10-CM | POA: Diagnosis not present

## 2024-02-01 DIAGNOSIS — E782 Mixed hyperlipidemia: Secondary | ICD-10-CM | POA: Diagnosis not present

## 2024-02-01 DIAGNOSIS — R051 Acute cough: Secondary | ICD-10-CM | POA: Diagnosis not present

## 2024-02-01 DIAGNOSIS — M069 Rheumatoid arthritis, unspecified: Secondary | ICD-10-CM | POA: Diagnosis not present

## 2024-02-01 DIAGNOSIS — D509 Iron deficiency anemia, unspecified: Secondary | ICD-10-CM | POA: Diagnosis not present

## 2024-02-01 DIAGNOSIS — K2100 Gastro-esophageal reflux disease with esophagitis, without bleeding: Secondary | ICD-10-CM | POA: Diagnosis not present

## 2024-02-01 DIAGNOSIS — I10 Essential (primary) hypertension: Secondary | ICD-10-CM | POA: Diagnosis not present

## 2024-02-01 DIAGNOSIS — K560 Paralytic ileus: Secondary | ICD-10-CM | POA: Diagnosis not present

## 2024-02-01 DIAGNOSIS — I82402 Acute embolism and thrombosis of unspecified deep veins of left lower extremity: Secondary | ICD-10-CM | POA: Diagnosis not present

## 2024-02-01 DIAGNOSIS — D692 Other nonthrombocytopenic purpura: Secondary | ICD-10-CM | POA: Diagnosis not present

## 2024-02-07 DIAGNOSIS — K5730 Diverticulosis of large intestine without perforation or abscess without bleeding: Secondary | ICD-10-CM | POA: Diagnosis not present

## 2024-02-07 DIAGNOSIS — R1033 Periumbilical pain: Secondary | ICD-10-CM | POA: Diagnosis not present

## 2024-02-07 DIAGNOSIS — K5904 Chronic idiopathic constipation: Secondary | ICD-10-CM | POA: Diagnosis not present

## 2024-02-07 DIAGNOSIS — D509 Iron deficiency anemia, unspecified: Secondary | ICD-10-CM | POA: Diagnosis not present

## 2024-02-08 DIAGNOSIS — K5904 Chronic idiopathic constipation: Secondary | ICD-10-CM | POA: Diagnosis not present

## 2024-02-08 DIAGNOSIS — K5730 Diverticulosis of large intestine without perforation or abscess without bleeding: Secondary | ICD-10-CM | POA: Diagnosis not present

## 2024-02-08 DIAGNOSIS — D509 Iron deficiency anemia, unspecified: Secondary | ICD-10-CM | POA: Diagnosis not present

## 2024-02-28 DIAGNOSIS — N3941 Urge incontinence: Secondary | ICD-10-CM | POA: Diagnosis not present

## 2024-03-06 DIAGNOSIS — K5904 Chronic idiopathic constipation: Secondary | ICD-10-CM | POA: Diagnosis not present

## 2024-03-06 DIAGNOSIS — K5730 Diverticulosis of large intestine without perforation or abscess without bleeding: Secondary | ICD-10-CM | POA: Diagnosis not present

## 2024-03-12 ENCOUNTER — Ambulatory Visit (HOSPITAL_COMMUNITY)
Admission: RE | Admit: 2024-03-12 | Discharge: 2024-03-12 | Disposition: A | Discharge status: Home / Self Care | Source: Ambulatory Visit | Attending: Internal Medicine | Admitting: Internal Medicine

## 2024-03-12 ENCOUNTER — Ambulatory Visit (HOSPITAL_COMMUNITY)
Admission: RE | Admit: 2024-03-12 | Discharge: 2024-03-12 | Disposition: A | Discharge status: Home / Self Care | Source: Ambulatory Visit | Attending: Otolaryngology

## 2024-03-12 DIAGNOSIS — R1310 Dysphagia, unspecified: Secondary | ICD-10-CM | POA: Diagnosis not present

## 2024-03-12 DIAGNOSIS — R6884 Jaw pain: Secondary | ICD-10-CM | POA: Diagnosis not present

## 2024-03-12 DIAGNOSIS — K449 Diaphragmatic hernia without obstruction or gangrene: Secondary | ICD-10-CM | POA: Diagnosis not present

## 2024-03-12 DIAGNOSIS — R609 Edema, unspecified: Secondary | ICD-10-CM | POA: Insufficient documentation

## 2024-03-12 DIAGNOSIS — K224 Dyskinesia of esophagus: Secondary | ICD-10-CM | POA: Diagnosis not present

## 2024-03-12 NOTE — Progress Notes (Signed)
 Modified Barium Swallow Study  Patient Details  Name: ABEERA BODEN MRN: [id number] Date of Birth: [birth year]  Today's Date: 03/12/2024  Modified Barium Swallow completed.  Full report located under Chart Review in the Imaging Section.  History of Present Illness Pt is an 78 year old female arriving for an OP MBS and esophagram.  Today pt reports primary concern is left jaw pain, worse at night when eating. Also globus on the left, thinks pills linger in the left side of her throat. Pt reported to ENT intermittent dysphagia with sensation of difficulty with solid food passage. Ddx oropharyngeal vs esophageal source. No prior swallow study. No history of aspiration pneumonia. Laryngoscopy showed interarytenoid pachydermia and post cricoid edema. Pt has a history of epistaxis   Clinical Impression Pt demonstrates a functional swallow mechanism with a DIGEST score of 0 (no residue, no aspiration) with some variations of normal, likely age related. Pt does initiate hyoid excursion well after liquids have pooled in deep pharyngeal sulci. Again no aspiration or penetration of the vestibuel observed. Pt did not cough. There was no complaint of globus or jaw pain. In AP view bolus passage was symmetrical; no finding to explain left sided globus. Pt to f/u with MD regarding jaw pain; continue regular diet and thin liquids Factors that may increase risk of adverse event in presence of aspiration Roderick Civatte & Jessy Morocco 2021):    Swallow Evaluation Recommendations Recommendations: PO diet PO Diet Recommendation: Regular;Thin liquids (Level 0) Liquid Administration via: Cup;Straw Medication Administration: Whole meds with liquid Supervision: Patient able to self-feed      Chibueze Beasley, Hardin Leys 03/12/2024,1:12 PM

## 2024-03-15 ENCOUNTER — Other Ambulatory Visit: Payer: Self-pay | Admitting: Gastroenterology

## 2024-03-15 DIAGNOSIS — R1310 Dysphagia, unspecified: Secondary | ICD-10-CM

## 2024-03-20 DIAGNOSIS — R2990 Unspecified symptoms and signs involving the nervous system: Secondary | ICD-10-CM | POA: Diagnosis not present

## 2024-03-20 DIAGNOSIS — G479 Sleep disorder, unspecified: Secondary | ICD-10-CM | POA: Diagnosis not present

## 2024-03-20 DIAGNOSIS — R413 Other amnesia: Secondary | ICD-10-CM | POA: Diagnosis not present

## 2024-03-20 DIAGNOSIS — R2689 Other abnormalities of gait and mobility: Secondary | ICD-10-CM | POA: Diagnosis not present

## 2024-03-20 DIAGNOSIS — Z5941 Food insecurity: Secondary | ICD-10-CM | POA: Diagnosis not present

## 2024-03-20 DIAGNOSIS — H9203 Otalgia, bilateral: Secondary | ICD-10-CM | POA: Diagnosis not present

## 2024-03-20 DIAGNOSIS — Z5982 Transportation insecurity: Secondary | ICD-10-CM | POA: Diagnosis not present

## 2024-03-20 DIAGNOSIS — R519 Headache, unspecified: Secondary | ICD-10-CM | POA: Diagnosis not present

## 2024-03-20 DIAGNOSIS — Z1331 Encounter for screening for depression: Secondary | ICD-10-CM | POA: Diagnosis not present

## 2024-03-20 DIAGNOSIS — H532 Diplopia: Secondary | ICD-10-CM | POA: Diagnosis not present

## 2024-03-23 ENCOUNTER — Other Ambulatory Visit: Payer: Self-pay

## 2024-03-23 ENCOUNTER — Encounter (HOSPITAL_BASED_OUTPATIENT_CLINIC_OR_DEPARTMENT_OTHER): Payer: Self-pay | Admitting: Emergency Medicine

## 2024-03-23 ENCOUNTER — Emergency Department (HOSPITAL_BASED_OUTPATIENT_CLINIC_OR_DEPARTMENT_OTHER)
Admission: EM | Admit: 2024-03-23 | Discharge: 2024-03-23 | Disposition: A | Discharge status: Home / Self Care | Attending: Emergency Medicine | Admitting: Emergency Medicine

## 2024-03-23 ENCOUNTER — Emergency Department (HOSPITAL_BASED_OUTPATIENT_CLINIC_OR_DEPARTMENT_OTHER)

## 2024-03-23 DIAGNOSIS — J069 Acute upper respiratory infection, unspecified: Secondary | ICD-10-CM | POA: Diagnosis not present

## 2024-03-23 DIAGNOSIS — R0981 Nasal congestion: Secondary | ICD-10-CM | POA: Diagnosis present

## 2024-03-23 DIAGNOSIS — Z96611 Presence of right artificial shoulder joint: Secondary | ICD-10-CM | POA: Diagnosis not present

## 2024-03-23 DIAGNOSIS — Z79899 Other long term (current) drug therapy: Secondary | ICD-10-CM | POA: Insufficient documentation

## 2024-03-23 DIAGNOSIS — R059 Cough, unspecified: Secondary | ICD-10-CM | POA: Diagnosis not present

## 2024-03-23 DIAGNOSIS — Z7901 Long term (current) use of anticoagulants: Secondary | ICD-10-CM | POA: Insufficient documentation

## 2024-03-23 DIAGNOSIS — H1033 Unspecified acute conjunctivitis, bilateral: Secondary | ICD-10-CM | POA: Insufficient documentation

## 2024-03-23 DIAGNOSIS — J029 Acute pharyngitis, unspecified: Secondary | ICD-10-CM | POA: Diagnosis not present

## 2024-03-23 LAB — RESP PANEL BY RT-PCR (RSV, FLU A&B, COVID)  RVPGX2
Influenza A by PCR: NEGATIVE
Influenza B by PCR: NEGATIVE
Resp Syncytial Virus by PCR: NEGATIVE
SARS Coronavirus 2 by RT PCR: NEGATIVE

## 2024-03-23 LAB — GROUP A STREP BY PCR: Group A Strep by PCR: NOT DETECTED

## 2024-03-23 NOTE — ED Provider Notes (Signed)
 [facility] Provider Note   CSN: [id number] Arrival date & time: 03/23/24  1125     History  Chief Complaint  Patient presents with   URI    Ranesha LIOR CARTELLI is a 78 y.o. female.  This is a 78 year old female is here today because she woke up and had a sore throat, conjunctivitis and congestion.  Denies any fever or chills.   URI      Home Medications Prior to Admission medications   Medication Sig Start Date End Date Taking? Authorizing Provider  amLODipine  (NORVASC ) 5 MG tablet Take 1 tablet (5 mg total) by mouth daily. 09/13/23   Dalene Duck, MD  apixaban  (ELIQUIS ) 5 MG TABS tablet Take 5 mg by mouth 2 (two) times daily.    [provider]  brompheniramine-pseudoephedrine -DM 30-2-10 MG/5ML syrup Take 5 mLs by mouth 3 (three) times daily as needed. 01/12/24   [provider]  cyanocobalamin  1000 MCG tablet Take 1,000 mcg by mouth daily.    [provider]  famotidine  (PEPCID ) 20 MG tablet Take 20 mg by mouth at bedtime.    [provider]  ferrous sulfate  325 (65 FE) MG tablet Take 1 tablet (325 mg total) by mouth daily with breakfast. 01/24/24 04/23/24  Dahal, Aminta Baldy, MD  furosemide  (LASIX ) 20 MG tablet Take 20 mg by mouth daily. 09/10/21   [provider]  gabapentin  (NEURONTIN ) 300 MG capsule Take 300 mg in the morning and 600 mg at night.  Take additional 300 mg as needed for severe pain 11/29/22   [provider]  oxybutynin  (DITROPAN -XL) 5 MG 24 hr tablet TAKE 1 TABLET BY MOUTH ONCE DAILY FOR OVERACTIVE BLADDER    [provider]  oxymetazoline  (AFRIN NASAL SPRAY) 0.05 % nasal spray Place 1 spray into both nostrils 2 (two) times daily. 01/16/24   Soldatova, Liuba, MD  pantoprazole  (PROTONIX ) 40 MG tablet Take 40 mg by mouth every morning. 01/12/24   [provider]  potassium chloride  SA (KLOR-CON  M) 20 MEQ tablet Take 1 tablet (20 mEq total) by mouth  once for 1 dose. 01/24/24 01/25/24  Hoyt Macleod, MD  rosuvastatin  (CRESTOR ) 10 MG tablet Take 1 tablet (10 mg total) by mouth daily. 02/26/21   Medina-Vargas, Monina C, NP  senna-docusate (SENOKOT-S) 8.6-50 MG tablet Take 1 tablet by mouth at bedtime. 01/24/24   Dahal, Binaya, MD      Allergies    Cephalexin, Tapentadol, Codeine, Grass pollen(k-o-r-t-swt vern), and Nucynta [tapentadol hcl]    Review of Systems   Review of Systems  Physical Exam Updated Vital Signs BP (!) 167/75 (BP Location: Right Arm)   Pulse 63   Temp 99 F (37.2 C) (Oral)   Resp 20   Ht 5\' 8"  (1.727 m)   Wt 88 kg   SpO2 95%   BMI 29.50 kg/m  Physical Exam Vitals and nursing note reviewed.  Constitutional:      Appearance: She is not ill-appearing or toxic-appearing.  HENT:     Head: Normocephalic and atraumatic.     Right Ear: External ear normal.     Left Ear: External ear normal.     Mouth/Throat:     Mouth: Mucous membranes are moist.     Pharynx: Posterior oropharyngeal erythema present. No oropharyngeal exudate.     Comments: Uvula midline.  Not stridulous. Eyes:     Pupils: Pupils are equal, round, and reactive to light.     Comments: Bilateral  conjunctivitis, no exudate  Cardiovascular:     Rate and Rhythm: Normal rate.  Pulmonary:     Effort: Pulmonary effort is normal.     Breath sounds: Normal breath sounds.  Abdominal:     General: Abdomen is flat.     Palpations: Abdomen is soft.  Neurological:     General: No focal deficit present.     Mental Status: She is alert.     ED Results / Procedures / Treatments   Labs (all labs ordered are listed, but only abnormal results are displayed) Labs Reviewed  RESP PANEL BY RT-PCR (RSV, FLU A&B, COVID)  RVPGX2  GROUP A STREP BY PCR    EKG None  Radiology DG Chest 2 View Result Date: 03/23/2024 CLINICAL DATA:  Cough.  Sore throat. EXAM: CHEST - 2 VIEW COMPARISON:  12/03/2023. FINDINGS: Bilateral lung fields are clear. Bilateral  costophrenic angles are clear. Normal cardio-mediastinal silhouette. No acute osseous abnormalities. Right reverse shoulder arthroplasty noted. The soft tissues are within normal limits. IMPRESSION: No active cardiopulmonary disease. Electronically Signed   By: Beula Brunswick M.D.   On: 03/23/2024 13:13    Procedures Procedures    Medications Ordered in ED Medications - No data to display  ED Course/ Medical Decision Making/ A&P                                 Medical Decision Making 78 year old female here today for URI symptoms.  Plan-patient had negative viral swabs.  She has normal vital signs.  Her chest x-ray is clear.  Lung sounds are clear.  Advised on symptomatic management.  Will discharge.  Amount and/or Complexity of Data Reviewed Radiology: ordered.           Final Clinical Impression(s) / ED Diagnoses Final diagnoses:  Viral upper respiratory tract infection    Rx / DC Orders ED Discharge Orders     None         Afton Horse T, DO 03/23/24 1534

## 2024-03-23 NOTE — ED Triage Notes (Signed)
 Woke up has sore throat and red eyes and congestion

## 2024-03-29 ENCOUNTER — Ambulatory Visit: Admitting: Cardiology

## 2024-04-27 DIAGNOSIS — R413 Other amnesia: Secondary | ICD-10-CM | POA: Diagnosis not present

## 2024-04-27 DIAGNOSIS — R2990 Unspecified symptoms and signs involving the nervous system: Secondary | ICD-10-CM | POA: Diagnosis not present

## 2024-04-27 DIAGNOSIS — R202 Paresthesia of skin: Secondary | ICD-10-CM | POA: Diagnosis not present

## 2024-04-27 DIAGNOSIS — G479 Sleep disorder, unspecified: Secondary | ICD-10-CM | POA: Diagnosis not present

## 2024-04-27 DIAGNOSIS — Z5912 Inadequate housing utilities: Secondary | ICD-10-CM | POA: Diagnosis not present

## 2024-04-27 DIAGNOSIS — Z5941 Food insecurity: Secondary | ICD-10-CM | POA: Diagnosis not present

## 2024-04-27 DIAGNOSIS — R200 Anesthesia of skin: Secondary | ICD-10-CM | POA: Diagnosis not present

## 2024-04-27 DIAGNOSIS — R519 Headache, unspecified: Secondary | ICD-10-CM | POA: Diagnosis not present

## 2024-04-27 DIAGNOSIS — R29818 Other symptoms and signs involving the nervous system: Secondary | ICD-10-CM | POA: Diagnosis not present

## 2024-04-27 DIAGNOSIS — R42 Dizziness and giddiness: Secondary | ICD-10-CM | POA: Diagnosis not present

## 2024-04-27 DIAGNOSIS — H532 Diplopia: Secondary | ICD-10-CM | POA: Diagnosis not present

## 2024-05-08 ENCOUNTER — Other Ambulatory Visit: Payer: Self-pay

## 2024-05-08 DIAGNOSIS — N950 Postmenopausal bleeding: Secondary | ICD-10-CM

## 2024-05-08 DIAGNOSIS — R1030 Lower abdominal pain, unspecified: Secondary | ICD-10-CM | POA: Diagnosis not present

## 2024-05-08 DIAGNOSIS — N939 Abnormal uterine and vaginal bleeding, unspecified: Secondary | ICD-10-CM | POA: Diagnosis not present

## 2024-05-09 ENCOUNTER — Ambulatory Visit
Admission: RE | Admit: 2024-05-09 | Discharge: 2024-05-09 | Disposition: A | Discharge status: Home / Self Care | Source: Ambulatory Visit

## 2024-05-09 DIAGNOSIS — N950 Postmenopausal bleeding: Secondary | ICD-10-CM | POA: Diagnosis not present

## 2024-05-09 DIAGNOSIS — K449 Diaphragmatic hernia without obstruction or gangrene: Secondary | ICD-10-CM | POA: Diagnosis not present

## 2024-05-09 DIAGNOSIS — R1030 Lower abdominal pain, unspecified: Secondary | ICD-10-CM | POA: Diagnosis not present

## 2024-05-09 DIAGNOSIS — N939 Abnormal uterine and vaginal bleeding, unspecified: Secondary | ICD-10-CM

## 2024-05-18 DIAGNOSIS — L84 Corns and callosities: Secondary | ICD-10-CM | POA: Diagnosis not present

## 2024-05-18 DIAGNOSIS — M461 Sacroiliitis, not elsewhere classified: Secondary | ICD-10-CM | POA: Diagnosis not present

## 2024-05-18 DIAGNOSIS — M7061 Trochanteric bursitis, right hip: Secondary | ICD-10-CM | POA: Diagnosis not present

## 2024-05-18 DIAGNOSIS — M25551 Pain in right hip: Secondary | ICD-10-CM | POA: Diagnosis not present

## 2024-05-18 DIAGNOSIS — M1611 Unilateral primary osteoarthritis, right hip: Secondary | ICD-10-CM | POA: Diagnosis not present

## 2024-05-18 DIAGNOSIS — M76891 Other specified enthesopathies of right lower limb, excluding foot: Secondary | ICD-10-CM | POA: Diagnosis not present

## 2024-05-24 ENCOUNTER — Encounter: Payer: Self-pay | Admitting: Cardiology

## 2024-05-24 ENCOUNTER — Ambulatory Visit: Attending: Cardiology | Admitting: Cardiology

## 2024-05-24 VITALS — BP 140/66 | HR 85 | Ht 68.0 in | Wt 184.0 lb

## 2024-05-24 DIAGNOSIS — R011 Cardiac murmur, unspecified: Secondary | ICD-10-CM | POA: Diagnosis not present

## 2024-05-24 DIAGNOSIS — I82402 Acute embolism and thrombosis of unspecified deep veins of left lower extremity: Secondary | ICD-10-CM | POA: Diagnosis not present

## 2024-05-24 DIAGNOSIS — I2510 Atherosclerotic heart disease of native coronary artery without angina pectoris: Secondary | ICD-10-CM | POA: Diagnosis not present

## 2024-05-24 DIAGNOSIS — I10 Essential (primary) hypertension: Secondary | ICD-10-CM | POA: Diagnosis not present

## 2024-05-24 DIAGNOSIS — R072 Precordial pain: Secondary | ICD-10-CM

## 2024-05-24 MED ORDER — METOPROLOL TARTRATE 100 MG PO TABS: 100.0000 mg | ORAL_TABLET | Freq: Once | ORAL | 0 refills | Status: AC

## 2024-05-24 MED ORDER — LOSARTAN POTASSIUM 25 MG PO TABS: 25.0000 mg | ORAL_TABLET | Freq: Every day | ORAL | 3 refills | Status: AC

## 2024-05-24 NOTE — Patient Instructions (Signed)
 Medication Instructions:  Your physician has recommended you make the following change in your medication:  START LOSARTAN  25 MG DAILY.   *If you need a refill on your cardiac medications before your next appointment, please call your pharmacy*  Lab Work: TO BE DONE IN 1 WEEK: BMET If you have labs (blood work) drawn today and your tests are completely normal, you will receive your results only by: MyChart Message (if you have MyChart) OR A paper copy in the mail If you have any lab test that is abnormal or we need to change your treatment, we will call you to review the results.  Testing/Procedures: Your physician has requested that you have an echocardiogram. Echocardiography is a painless test that uses sound waves to create images of your heart. It provides your doctor with information about the size and shape of your heart and how well your heart's chambers and valves are working. This procedure takes approximately one hour. There are no restrictions for this procedure. Please do NOT wear cologne, perfume, aftershave, or lotions (deodorant is allowed). Please arrive 15 minutes prior to your appointment time.  Please note: We ask at that you not bring children with you during ultrasound (echo/ vascular) testing. Due to room size and safety concerns, children are not allowed in the ultrasound rooms during exams. Our front office staff cannot provide observation of children in our lobby area while testing is being conducted. An adult accompanying a patient to their appointment will only be allowed in the ultrasound room at the discretion of the ultrasound technician under special circumstances. We apologize for any inconvenience.   YOUR PROVIDER RECOMMENDS THAT YOU HAVE A CORONARY CTA  Follow-Up: At [facility], you and your health needs are our priority.  As part of our continuing mission to provide you with exceptional heart care, our providers are all part of one team.  This  team includes your primary Cardiologist (physician) and Advanced Practice Providers or APPs (Physician Assistants and Nurse Practitioners) who all work together to provide you with the care you need, when you need it.  Your next appointment:   34 month(s)  Provider:   [name], MD OR APP  We recommend signing up for the patient portal called MyChart.  Sign up information is provided on this After Visit Summary.  MyChart is used to connect with patients for Virtual Visits (Telemedicine).  Patients are able to view lab/test results, encounter notes, upcoming appointments, etc.  Non-urgent messages can be sent to your provider as well.   To learn more about what you can do with MyChart, go to [email or web address].   Other Instructions   Your cardiac CT will be scheduled at one of the below locations:   [facility] [address] [place], [place] [address] [phone]   OR   [facility] [address]  [place], [place] [address]   If scheduled at [facility], please arrive at the [facility] (Entrance C2) of [facility] 30 minutes prior to test start time. You can use the FREE valet parking offered at entrance C (encouraged to control the heart rate for the test)  Proceed to the [facility] Radiology Department (first floor) to check-in and test prep.  All radiology patients and guests should use entrance C2 at [facility], accessed from [facility], even though the hospital's physical address listed is [address].  If scheduled at  the Heart and Vascular Tower at [address], please enter the parking lot using the [address] entrance and use the FREE valet service at the patient drop-off area. Enter the buidling and check-in with registration on the main floor.  Please follow these instructions carefully (unless otherwise directed):  An IV will be  required for this test and Nitroglycerin  will be given.   On the Night Before the Test: Be sure to Drink plenty of water. Do not consume any caffeinated/decaffeinated beverages or chocolate 12 hours prior to your test. Do not take any antihistamines 12 hours prior to your test.  On the Day of the Test: Drink plenty of water until 1 hour prior to the test. Do not eat any food 1 hour prior to test. You may take your regular medications prior to the test.  Take metoprolol  (Lopressor ) 100 MG  two hours prior to test. If you take Furosemide /Hydrochlorothiazide /Spironolactone/Chlorthalidone, please HOLD on the morning of the test. Patients who wear a continuous glucose monitor MUST remove the device prior to scanning. FEMALES- please wear underwire-free bra if available, avoid dresses & tight clothing      After the Test: Drink plenty of water. After receiving IV contrast, you may experience a mild flushed feeling. This is normal. On occasion, you may experience a mild rash up to 24 hours after the test. This is not dangerous. If this occurs, you can take Benadryl  25 mg, Zyrtec, Claritin , or Allegra and increase your fluid intake. (Patients taking Tikosyn should avoid Benadryl , and may take Zyrtec, Claritin , or Allegra) If you experience trouble breathing, this can be serious. If it is severe call 911 IMMEDIATELY. If it is mild, please call our office.  We will call to schedule your test 2-4 weeks out understanding that some insurance companies will need an authorization prior to the service being performed.   For more information and frequently asked questions, please visit our website : [email or web address]  For non-scheduling related questions, please contact the cardiac imaging nurse navigator should you have any questions/concerns: Cardiac Imaging Nurse Navigators Direct Office Dial: [phone]   For scheduling needs, including cancellations and rescheduling, please call  [place], [phone].

## 2024-05-24 NOTE — Progress Notes (Signed)
 Cardiology Office Note:   Date:  05/28/2024  ID:  Diane [name], DOB 07/24/1946, MRN [id number] PCP: [name], MD  [facility] Providers Cardiologist:  [name], MD    History of Present Illness:    Discussed the use of AI scribe software for clinical note transcription with the patient, who gave verbal consent to proceed.  History of Present Illness She was referred back to cardiology primarily for assistance with BP management. She has a history of hypertension, DM, recurrent DVT s/p mechanical thrombectomy 06/01/2021, RA, scleroderma. Patient remotely a [facility] patient, was seen in 2022 for chest pain that lead to an admission. Her past cardiac evaluations include a coronary CTA in 2022 showing minimal coronary artery disease and a normal left heart catheterization in 2018.   In January of this year, she was seen by [name].   She sometimes experiences a sensation of 'water swashing' across her chest, particularly when lying down, accompanied by heavy chest pain. This sensation improves with the use of Tums, allowing her to sleep. She describes the experience as 'very scary'.  She has had multiple emergency room visits due to high blood pressure episodes, with readings as high as 200/100 mmHg. These episodes were associated with epistaxis and diplopia as well as chest discomfort. Her blood pressure has since improved to 142/68 mmHg with increased medication, and she still experiences chest discomfort, though less severe than before. The chest pain can be persistent and is more noticeable when lying down.  She reports shortness of breath and dizziness. No palpitations, but she notes a loud heartbeat when lying down.  . She is currently taking Eliquis  twice daily and amlodipine  for blood pressure.  She has a history of diabetes with an A1c of 6.8, managed with diet. She has experienced significant weight loss, from 230 lbs to 185 lbs, attributed to a  lack of appetite rather than dietary changes.  She experiences pain from her toes to her hip, for which she recently received a cortisone shot that provided relief. She is not currently taking Lasix  and is unsure about her use of pantoprazole , though she takes Tums for acid reflux symptoms.  Studies Reviewed:    EKG:   EKG Interpretation Date/Time:  Thursday May 24 2024 15:31:32 EDT Ventricular Rate:  81 PR Interval:  136 QRS Duration:  84 QT Interval:  392 QTC Calculation: 455 R Axis:   41  Text Interpretation: Normal sinus rhythm Nonspecific T wave abnormality When compared with ECG of 19-Jan-2024 12:12, Nonspecific T wave abnormality now evident in Inferior leads Confirmed by Trudy Birmingham [phone]) on 05/24/2024 3:50:26 PM    07/02/2021 Coronary CTA IMPRESSION: 1. Coronary calcium  score of 303. This was 85th percentile for age-, sex, and race-matched controls.   2. Normal coronary origin with right dominance.   3. Non-obstructive CAD (<25%) in the LAD/LCX/RCA.   RECOMMENDATIONS: 1. Minimal non-obstructive CAD (0-24%). Consider non-atherosclerotic causes of chest pain. Consider preventive therapy and risk factor modification.  07/02/2021 TTE  IMPRESSIONS     1. Left ventricular ejection fraction, by estimation, is 60 to 65%. The  left ventricle has normal function. The left ventricle has no regional  wall motion abnormalities. There is mild left ventricular hypertrophy.  Left ventricular diastolic parameters  are consistent with Grade I diastolic dysfunction (impaired relaxation).   2. Right ventricular systolic function is normal. The right ventricular  size is normal. There is normal pulmonary artery systolic pressure. The  estimated right ventricular systolic  pressure is 25.7 mmHg.   3. Left atrial size was moderately dilated.   4. The mitral valve is normal in structure. Mild mitral valve  regurgitation. No evidence of mitral stenosis.   5. The aortic valve is  tricuspid. Aortic valve regurgitation is not  visualized. Mild aortic valve sclerosis is present, with no evidence of  aortic valve stenosis.   6. The inferior vena cava is normal in size with greater than 50%  respiratory variability, suggesting right atrial pressure of 3 mmHg.   FINDINGS   Left Ventricle: Left ventricular ejection fraction, by estimation, is 60  to 65%. The left ventricle has normal function. The left ventricle has no  regional wall motion abnormalities. The left ventricular internal cavity  size was normal in size. There is   mild left ventricular hypertrophy. Left ventricular diastolic parameters  are consistent with Grade I diastolic dysfunction (impaired relaxation).   Right Ventricle: The right ventricular size is normal. No increase in  right ventricular wall thickness. Right ventricular systolic function is  normal. There is normal pulmonary artery systolic pressure. The tricuspid  regurgitant velocity is 2.38 m/s, and   with an assumed right atrial pressure of 3 mmHg, the estimated right  ventricular systolic pressure is 25.7 mmHg.   Left Atrium: Left atrial size was moderately dilated.   Right Atrium: Right atrial size was normal in size.   Pericardium: There is no evidence of pericardial effusion.   Mitral Valve: The mitral valve is normal in structure. Mild mitral annular  calcification. Mild mitral valve regurgitation. No evidence of mitral  valve stenosis.   Tricuspid Valve: The tricuspid valve is normal in structure. Tricuspid  valve regurgitation is trivial. No evidence of tricuspid stenosis.   Aortic Valve: The aortic valve is tricuspid. Aortic valve regurgitation is  not visualized. Mild aortic valve sclerosis is present, with no evidence  of aortic valve stenosis. Aortic valve mean gradient measures 7.0 mmHg.  Aortic valve peak gradient  measures 12.4 mmHg. Aortic valve area, by VTI measures 1.21 cm.   Pulmonic Valve: The pulmonic valve  was normal in structure. Pulmonic valve  regurgitation is not visualized. No evidence of pulmonic stenosis.   Aorta: The aortic root is normal in size and structure.   Venous: The inferior vena cava is normal in size with greater than 50%  respiratory variability, suggesting right atrial pressure of 3 mmHg.   IAS/Shunts: No atrial level shunt detected by color flow Doppler.     Risk Assessment/Calculations:           Physical Exam:   VS:  BP (!) 140/66   Pulse 85   Ht 5' 8 (1.727 m)   Wt 184 lb (83.5 kg)   SpO2 98%   BMI 27.98 kg/m    Wt Readings from Last 3 Encounters:  05/24/24 184 lb (83.5 kg)  03/23/24 194 lb 0.1 oz (88 kg)  01/18/24 194 lb 0.1 oz (88 kg)     Physical Exam Vitals reviewed.  Constitutional:      Appearance: Normal appearance.  HENT:     Head: Normocephalic.     Nose: Nose normal.  Eyes:     Pupils: Pupils are equal, round, and reactive to light.  Cardiovascular:     Rate and Rhythm: Normal rate and regular rhythm.     Pulses: Normal pulses.     Heart sounds: Murmur (systolic) heard.     No friction rub. No gallop.  Pulmonary:  Effort: Pulmonary effort is normal.     Breath sounds: Normal breath sounds.  Abdominal:     General: Abdomen is flat.  Musculoskeletal:     Comments: No pitting edema.  Skin:    General: Skin is warm and dry.     Capillary Refill: Capillary refill takes less than 2 seconds.  Neurological:     General: No focal deficit present.     Mental Status: She is alert and oriented to person, place, and time.  Psychiatric:        Mood and Affect: Mood normal.        Behavior: Behavior normal.        Thought Content: Thought content normal.        Judgment: Judgment normal.     ASSESSMENT AND PLAN:    Assessment & Plan Hypertension Hypertension with recent severe elevation episodes causing symptoms such as epistaxis and double vision. Currently better controlled with amlodipine , but still elevated at 140/66.  Losartan  was previously used but discontinued for unknown reasons. - Reintroduce losartan  25 mg daily - Continue amlodipine  - Recheck kidney function in two weeks after starting losartan  - Echocardiogram ordered  Nonobstructive coronary artery disease Nonobstructive coronary artery disease confirmed by coronary CT scan 2022. Previous left heart catheterization in 2018 was normal. Atypical chest pain reported by patient today. Likelihood of ischemic origin is low due to positional nature of pain and relief with antacids. Given some discomfort with hypertensive episodes and hx, there could be a degree of microvascular ischemia though less likely.  - Order coronary CTA to assess coronary perfusion. - Continue Atorvastatin 20mg  - No indication for ASA with chronic Eliquis   Hyperlipidemia Patient started on Atorvastatin 20mg  after seeing [facility] earlier this year. Subsequent labs with PCP showed LDL 38mg /dL. 2022 lipoprotein A was 119.7, needs ongoing aggressive cholesterol control.  - Continue Atorvastatin 20mg   Heart murmur, likely mitral valve Heart murmur likely related to mitral valve with 2022 TTE showing mild MR. - Order echocardiogram  Chest pain, likely due to GERD Intermittent chest pain, primarily occurring when lying down, likely due to GERD. Symptoms improve with Tums. Not currently taking prescribed pantoprazole . - Provide list of over-the-counter acid reflux medications, proton pump inhibitors - Advised taking acid reflux medication in the morning  Type 2 diabetes mellitus Type 2 diabetes mellitus with an A1c of 6.8. Currently managed with diet.   Recurrent DVT s/p thrombectomy - Continues Eliquis  per PCP        Signed, Artist Pouch, PA-C

## 2024-05-25 DIAGNOSIS — M25511 Pain in right shoulder: Secondary | ICD-10-CM | POA: Diagnosis not present

## 2024-05-25 DIAGNOSIS — Z96611 Presence of right artificial shoulder joint: Secondary | ICD-10-CM | POA: Diagnosis not present

## 2024-05-28 DIAGNOSIS — Z96611 Presence of right artificial shoulder joint: Secondary | ICD-10-CM | POA: Diagnosis not present

## 2024-05-28 DIAGNOSIS — R600 Localized edema: Secondary | ICD-10-CM | POA: Diagnosis not present

## 2024-05-28 DIAGNOSIS — M25511 Pain in right shoulder: Secondary | ICD-10-CM | POA: Diagnosis not present

## 2024-05-28 DIAGNOSIS — E119 Type 2 diabetes mellitus without complications: Secondary | ICD-10-CM | POA: Diagnosis not present

## 2024-05-28 DIAGNOSIS — M25871 Other specified joint disorders, right ankle and foot: Secondary | ICD-10-CM | POA: Diagnosis not present

## 2024-05-28 DIAGNOSIS — M778 Other enthesopathies, not elsewhere classified: Secondary | ICD-10-CM | POA: Diagnosis not present

## 2024-06-01 ENCOUNTER — Other Ambulatory Visit: Payer: Self-pay | Admitting: Student

## 2024-06-01 DIAGNOSIS — M25511 Pain in right shoulder: Secondary | ICD-10-CM

## 2024-06-01 DIAGNOSIS — Z96611 Presence of right artificial shoulder joint: Secondary | ICD-10-CM

## 2024-06-19 ENCOUNTER — Ambulatory Visit
Admission: RE | Admit: 2024-06-19 | Discharge: 2024-06-19 | Disposition: A | Discharge status: Home / Self Care | Source: Ambulatory Visit | Attending: Student | Admitting: Student

## 2024-06-19 DIAGNOSIS — M25511 Pain in right shoulder: Secondary | ICD-10-CM | POA: Diagnosis not present

## 2024-06-19 DIAGNOSIS — Z96611 Presence of right artificial shoulder joint: Secondary | ICD-10-CM | POA: Diagnosis not present

## 2024-06-19 DIAGNOSIS — Z86718 Personal history of other venous thrombosis and embolism: Secondary | ICD-10-CM | POA: Diagnosis not present

## 2024-06-19 DIAGNOSIS — E782 Mixed hyperlipidemia: Secondary | ICD-10-CM | POA: Diagnosis not present

## 2024-06-19 DIAGNOSIS — I2510 Atherosclerotic heart disease of native coronary artery without angina pectoris: Secondary | ICD-10-CM | POA: Diagnosis not present

## 2024-06-19 DIAGNOSIS — Z7901 Long term (current) use of anticoagulants: Secondary | ICD-10-CM | POA: Diagnosis not present

## 2024-06-19 DIAGNOSIS — I10 Essential (primary) hypertension: Secondary | ICD-10-CM | POA: Diagnosis not present

## 2024-06-20 ENCOUNTER — Encounter (HOSPITAL_COMMUNITY): Payer: Self-pay

## 2024-06-21 DIAGNOSIS — Z86718 Personal history of other venous thrombosis and embolism: Secondary | ICD-10-CM | POA: Diagnosis not present

## 2024-06-21 DIAGNOSIS — E782 Mixed hyperlipidemia: Secondary | ICD-10-CM | POA: Diagnosis not present

## 2024-06-21 DIAGNOSIS — I10 Essential (primary) hypertension: Secondary | ICD-10-CM | POA: Diagnosis not present

## 2024-06-21 DIAGNOSIS — I2510 Atherosclerotic heart disease of native coronary artery without angina pectoris: Secondary | ICD-10-CM | POA: Diagnosis not present

## 2024-06-21 DIAGNOSIS — Z7901 Long term (current) use of anticoagulants: Secondary | ICD-10-CM | POA: Diagnosis not present

## 2024-06-29 DIAGNOSIS — M76891 Other specified enthesopathies of right lower limb, excluding foot: Secondary | ICD-10-CM | POA: Diagnosis not present

## 2024-06-29 DIAGNOSIS — M1611 Unilateral primary osteoarthritis, right hip: Secondary | ICD-10-CM | POA: Diagnosis not present

## 2024-06-29 DIAGNOSIS — M7061 Trochanteric bursitis, right hip: Secondary | ICD-10-CM | POA: Diagnosis not present

## 2024-06-29 DIAGNOSIS — M461 Sacroiliitis, not elsewhere classified: Secondary | ICD-10-CM | POA: Diagnosis not present

## 2024-06-29 DIAGNOSIS — M25551 Pain in right hip: Secondary | ICD-10-CM | POA: Diagnosis not present

## 2024-06-30 ENCOUNTER — Emergency Department (HOSPITAL_BASED_OUTPATIENT_CLINIC_OR_DEPARTMENT_OTHER)
Admission: EM | Admit: 2024-06-30 | Discharge: 2024-06-30 | Disposition: A | Discharge status: Home / Self Care | Attending: Emergency Medicine | Admitting: Emergency Medicine

## 2024-06-30 ENCOUNTER — Other Ambulatory Visit: Payer: Self-pay

## 2024-06-30 ENCOUNTER — Encounter (HOSPITAL_BASED_OUTPATIENT_CLINIC_OR_DEPARTMENT_OTHER): Payer: Self-pay | Admitting: Emergency Medicine

## 2024-06-30 DIAGNOSIS — Z7901 Long term (current) use of anticoagulants: Secondary | ICD-10-CM | POA: Diagnosis not present

## 2024-06-30 DIAGNOSIS — S4992XA Unspecified injury of left shoulder and upper arm, initial encounter: Secondary | ICD-10-CM | POA: Diagnosis present

## 2024-06-30 DIAGNOSIS — X58XXXA Exposure to other specified factors, initial encounter: Secondary | ICD-10-CM | POA: Insufficient documentation

## 2024-06-30 DIAGNOSIS — S46212A Strain of muscle, fascia and tendon of other parts of biceps, left arm, initial encounter: Secondary | ICD-10-CM | POA: Diagnosis not present

## 2024-06-30 MED ORDER — DICLOFENAC SODIUM 1 % EX GEL: 4.0000 g | Freq: Four times a day (QID) | CUTANEOUS | 0 refills | Status: AC

## 2024-06-30 NOTE — Discharge Instructions (Addendum)
 Please return for chest pain shortness of breath especially if it occurs upon exertion.  Please follow-up with your cardiologist as scheduled in the clinic.  Use the gel as prescribed. Also take tylenol  1000mg (2 extra strength) four times a day.

## 2024-06-30 NOTE — ED Notes (Signed)
 Pts daughter currently on the phone with her sister discussing ED visit.

## 2024-06-30 NOTE — ED Triage Notes (Signed)
 Pt reports sharp LUE pain since last night; denies injury or other sxs

## 2024-06-30 NOTE — ED Provider Notes (Addendum)
 [place] [facility] Provider Note   CSN: [id number] Arrival date & time: 06/30/24  2105     Patient presents with: Arm Pain   Diane [name] is a 78 y.o. female.   78 yo F with a chief complaints of left arm pain.  This is over the anterior lateral aspect of the upper arm.  Seems to come and go.  Nothing she seems to do seems to make it better or worse.  Has been going on since this morning.  No chest pain no difficulty breathing not worse going up stairs or walking down the street.  She denies injury to the area.   Arm Pain       Prior to Admission medications   Medication Sig Start Date End Date Taking? Authorizing Provider  diclofenac  Sodium (VOLTAREN ) 1 % GEL Apply 4 g topically 4 (four) times daily. 06/30/24  Yes Emil Share, DO  amLODipine  (NORVASC ) 5 MG tablet Take 1 tablet (5 mg total) by mouth daily. 09/13/23   Patt Alm Macho, MD  apixaban  (ELIQUIS ) 5 MG TABS tablet Take 5 mg by mouth 2 (two) times daily.    [provider]  atorvastatin (LIPITOR) 20 MG tablet 1 tablet Orally Once a day; Duration: 30 day(s) Patient not taking: Reported on 05/24/2024 04/27/24   [provider]  brompheniramine-pseudoephedrine -DM 30-2-10 MG/5ML syrup Take 5 mLs by mouth 3 (three) times daily as needed. 01/12/24   [provider]  cyanocobalamin  1000 MCG tablet Take 1,000 mcg by mouth daily.    [provider]  famotidine  (PEPCID ) 20 MG tablet Take 20 mg by mouth at bedtime.    [provider]  ferrous sulfate  325 (65 FE) MG tablet Take 1 tablet (325 mg total) by mouth daily with breakfast. 01/24/24 05/24/24  Dahal, Chapman, MD  furosemide  (LASIX ) 20 MG tablet Take 20 mg by mouth daily. Patient not taking: Reported on 05/24/2024 09/10/21   [provider]  gabapentin  (NEURONTIN ) 300 MG capsule Take 300 mg in the morning and 600 mg at night.  Take additional 300 mg as needed for severe pain 11/29/22    [provider]  losartan  (COZAAR ) 25 MG tablet Take 1 tablet (25 mg total) by mouth daily. 05/24/24 08/22/24  Trudy Birmingham, PA-C  metoprolol  tartrate (LOPRESSOR ) 100 MG tablet Take 1 tablet (100 mg total) by mouth once for 1 dose. TWO HOURS PRIOR TO TESTING 05/24/24 05/24/24  Williams, Evan, PA-C  oxybutynin  (DITROPAN -XL) 5 MG 24 hr tablet TAKE 1 TABLET BY MOUTH ONCE DAILY FOR OVERACTIVE BLADDER    [provider]  oxymetazoline  (AFRIN NASAL SPRAY) 0.05 % nasal spray Place 1 spray into both nostrils 2 (two) times daily. 01/16/24   Soldatova, Liuba, MD  pantoprazole  (PROTONIX ) 40 MG tablet Take 40 mg by mouth every morning. 01/12/24   [provider]  potassium chloride  SA (KLOR-CON  M) 20 MEQ tablet Take 1 tablet (20 mEq total) by mouth once for 1 dose. 01/24/24 05/24/24  Arlice Chapman, MD  rosuvastatin  (CRESTOR ) 10 MG tablet Take 1 tablet (10 mg total) by mouth daily. Patient not taking: Reported on 05/24/2024 02/26/21   Medina-Vargas, Monina C, NP  senna-docusate (SENOKOT-S) 8.6-50 MG tablet Take 1 tablet by mouth at bedtime. 01/24/24   Dahal, Binaya, MD    Allergies: Cephalexin, Tapentadol, Codeine, Grass pollen([id number]-o-r-t-swt vern), and Nucynta [tapentadol hcl]    Review of Systems  Updated Vital Signs BP (!) 180/79 (BP Location: Right Arm)  Pulse 84   Temp 98.8 F (37.1 C) (Oral)   Resp 18   Ht 5' 8 (1.727 m)   Wt 86.6 kg   SpO2 100%   BMI 29.04 kg/m   Physical Exam Vitals and nursing note reviewed.  Constitutional:      General: She is not in acute distress.    Appearance: She is well-developed. She is not diaphoretic.  HENT:     Head: Normocephalic and atraumatic.  Eyes:     Pupils: Pupils are equal, round, and reactive to light.  Cardiovascular:     Rate and Rhythm: Normal rate and regular rhythm.     Heart sounds: No murmur heard.    No friction rub. No gallop.  Pulmonary:     Effort: Pulmonary effort is normal.     Breath sounds: No wheezing or  rales.  Abdominal:     General: There is no distension.     Palpations: Abdomen is soft.     Tenderness: There is no abdominal tenderness.  Musculoskeletal:        General: Tenderness present.     Cervical back: Normal range of motion and neck supple.     Comments: Patient's pain is reproduced by palpation over the biceps.  No obvious bony tenderness.  Pulse motor and sensation are intact distally.  Skin:    General: Skin is warm and dry.  Neurological:     Mental Status: She is alert and oriented to person, place, and time.  Psychiatric:        Behavior: Behavior normal.     (all labs ordered are listed, but only abnormal results are displayed) Labs Reviewed - No data to display  EKG: None  Radiology: No results found.   Procedures   Medications Ordered in the ED - No data to display                                  Medical Decision Making  78 yo F with a chief complaint of left arm pain.  This been going on since this morning.  The patient took a baby aspirin  this morning but has not tried any other medications for this.  Most likely a bicep strain by history and physical.  I discussed this with the patient and family and encouraged them to follow-up with her primary care doctor in the office.  The patient's daughter is here and she became angry that I did not perform any tests.  She was quite rude and told me that she is being an advocate for her mother by demanding a cardiac workup as well as making sure that her mother did not have a stroke.  I discussed with them at length how focal left arm pain is not associated with a stroke or heart attack.  I encouraged them to try therapy at home and to follow-up with their doctor in clinic.  9:27 PM:  I have discussed the diagnosis/risks/treatment options with the patient and family.  Evaluation and diagnostic testing in the emergency department does not suggest an emergent condition requiring admission or immediate  intervention beyond what has been performed at this time.  They will follow up with PCP. We also discussed returning to the ED immediately if new or worsening sx occur. We discussed the sx which are most concerning (e.g., sudden worsening pain, fever, inability to tolerate by mouth) that necessitate immediate return. Medications administered to  the patient during their visit and any new prescriptions provided to the patient are listed below.  Medications given during this visit Medications - No data to display   The patient appears reasonably screen and/or stabilized for discharge and I doubt any other medical condition or other [facility] requiring further screening, evaluation, or treatment in the ED at this time prior to discharge.         Final diagnoses:  Biceps strain, left, initial encounter    ED Discharge Orders          Ordered    diclofenac  Sodium (VOLTAREN ) 1 % GEL  4 times daily        06/30/24 2124               Emil Share, DO 06/30/24 2159

## 2024-06-30 NOTE — ED Notes (Signed)
 Further discussed AVS instructions with pt and pts daughter. Pt verbalized understanding of today;s diagnosis and pain management. Pt comfortable with going home. Pt's daughter requesting record of today's visit. AVS copy was provided, instructions on mychart provided, and informed pt could request a copy of her medical record by calling [facility]. Pt  departing with pts daughter at this time.

## 2024-06-30 NOTE — ED Notes (Addendum)
 AVS provided by edp Emil was discussed with pt and pts daughter Sueanne. Pt's daughter requesting cardiac work up. EDP Floyd informed. EDP informed pt and pts family cardiac work up not necessary at this time as this pain is palpable on left bicep.

## 2024-07-03 ENCOUNTER — Ambulatory Visit (HOSPITAL_COMMUNITY)
Admission: RE | Admit: 2024-07-03 | Discharge: 2024-07-03 | Disposition: A | Discharge status: Home / Self Care | Source: Ambulatory Visit | Attending: Cardiology | Admitting: Cardiology

## 2024-07-03 DIAGNOSIS — R011 Cardiac murmur, unspecified: Secondary | ICD-10-CM | POA: Diagnosis not present

## 2024-07-03 LAB — ECHOCARDIOGRAM COMPLETE
AR max vel: 1.39 cm2
AV Area VTI: 1.39 cm2
AV Area mean vel: 1.35 cm2
AV Mean grad: 9 mmHg
AV Peak grad: 18.4 mmHg
Ao pk vel: 2.15 m/s
Area-P 1/2: 3.7 cm2
S' Lateral: 3.1 cm

## 2024-07-04 ENCOUNTER — Ambulatory Visit: Payer: Self-pay | Admitting: Cardiology

## 2024-07-11 DIAGNOSIS — M779 Enthesopathy, unspecified: Secondary | ICD-10-CM | POA: Diagnosis not present

## 2024-08-10 ENCOUNTER — Other Ambulatory Visit
Admission: RE | Admit: 2024-08-10 | Discharge: 2024-08-10 | Disposition: A | Discharge status: Home / Self Care | Source: Ambulatory Visit | Attending: Sports Medicine | Admitting: Sports Medicine

## 2024-08-10 DIAGNOSIS — M7551 Bursitis of right shoulder: Secondary | ICD-10-CM | POA: Diagnosis not present

## 2024-08-10 LAB — SYNOVIAL CELL COUNT + DIFF, W/ CRYSTALS
Crystals, Fluid: NONE SEEN
Eosinophils-Synovial: 0 %
Lymphocytes-Synovial Fld: 11 %
Monocyte-Macrophage-Synovial Fluid: 15 %
Neutrophil, Synovial: 74 %
WBC, Synovial: 41 /mm3 (ref 0–200)

## 2024-08-13 NOTE — Progress Notes (Unsigned)
 Cardiology Office Note:  .   Date:  08/14/2024  ID:  Diane [name], DOB 01-24-1946, MRN [id number] PCP: [name], MD  [facility] Providers Cardiologist:  [name], MD {  History of Present Illness: Diane   Raphael VIKTORYA [name] is a 78 y.o. female  with PMHx of mild nonobstructive CAD (CTA coronaries 07/2021: CAC score of 303 with nonobstructive mild multivessel disease), recurrent DVT on Eliquis  s/p mechanical thrombectomy 06/01/2021, hypertension, diabetes, hyperlipidemia, 50% right ICA stenosis who reports to [facility] office for follow up.   Last seen in heartcare OV 05/24/2024 by Artist Pouch, PA-C for referral with BP management. Reported chest discomfort, SOB, dizziness. Noted multiple ER visits due to high BP in 200's/100's associated w/ epistaxis and double vision. Her BP has improved to 142/68 with increased medication. Restarted losartan  25 mg.  Continued on amlodipine  5 mg. Noted Lipitor 20 mg and Crestor  10 mg on med list, however noted not taking. Follow ECHO 07/03/2024 showed 60-65%, moderate LVH, G1DD, mildly elevated PASP with RVSP of 39.5 mmHg, mildly dilated LA, trivial MV regurgitation, moderate AV calcification. Ordered coronary CTA that is still pending.   Patient seen by [facility] 06/19/2024 by Dr. Wilburn. Reported occasional left sided aching chest discomfort that has been present for years occurring at rest, which was previously worked up with prior CTA coronary.  Increased Losartan  to 50 mg daily in the setting. Continue Amlodipine  5 mg, Eliquis  5 mg BID, ASA 81 mg daily, Crestor  20 mg daily.   Today, reports chronic unchanged chest pain described as throbbing that occurs with lying down associated with her heart beating in ears.  Notes lower extremity edema that has occurred for months with no known correlations.  Per chart review, patient was previously given Lasix  20 mg daily but never picked up medication due to prior excessive urination when taking  lasix .  Patient sleeps with 4 pillow due to GI issues when laying flat and denies any orthopnea. Denies any dizziness, SOB, presyncope, syncope. Reports adherence with medications.  Patient uses wrist blood pressure cuff at home with SBP in the 160s.  Reports low-sodium diet.  Patient is not very active due to GI issues. Denies tobacco use/alcohol/drug use. Denies any recent hospitalizations or visits to the emergency department.    ROS: 10 point review of system has been reviewed and considered negative except ones been listed in the HPI.   Studies Reviewed: .   Lexiscan  04/20/2017 There was no ST segment deviation noted during stress. Defect 1: There is a large defect of moderate severity. Findings consistent with ischemia. Nuclear stress EF: 67%. Large size, moderate intensity reversible (SDS 6) distal anterior, apical, septal and inferoseptal perfusion defect consistent with ischemia. LVEF 67% with normal wall motion. This is a high risk study.  LHC 04/21/2017 The left ventricular systolic function is normal. LV end diastolic pressure is normal. The left ventricular ejection fraction is 55-65% by visual estimate.  Coronary CTA 07/2021 IMPRESSION: 1. Coronary calcium  score of 303. This was 85th percentile for age-,sex, and race-matched controls. 2. Normal coronary origin with right dominance. 3. Non-obstructive CAD (<25%) in the LAD/LCX/RCA. IMPRESSION: Mild circumferential thickening of small hiatal hernia and distal esophagus. Correlate with any clinical evidence of esophagitis with dedicated esophageal evaluation as warranted. Otherwise no acute or significant extracardiac findings. Aortic Atherosclerosis (ICD10-I70.0).  ECHO IMPRESSIONS 07/2024  1. Left ventricular ejection fraction, by estimation, is 60 to 65%. The  left ventricle has normal function. The left ventricle  has no regional  wall motion abnormalities. There is moderate concentric left ventricular  hypertrophy. Left  ventricular  diastolic parameters are consistent with Grade I diastolic dysfunction  (impaired relaxation).   2. Right ventricular systolic function is normal. The right ventricular  size is normal. There is mildly elevated pulmonary artery systolic  pressure. The estimated right ventricular systolic pressure is 39.5 mmHg.   3. Left atrial size was mildly dilated.   4. The mitral valve is normal in structure. Trivial mitral valve  regurgitation. No evidence of mitral stenosis.   5. The aortic valve is tricuspid. There is moderate calcification of the  aortic valve. Aortic valve regurgitation is not visualized. Aortic valve  sclerosis/calcification is present, without any evidence of aortic  stenosis. Aortic valve mean gradient  measures 9.0 mmHg.   6. The inferior vena cava is normal in size with greater than 50%  respiratory variability, suggesting right atrial pressure of 3 mmHg.   Physical Exam:   VS:  BP (!) 159/85 (BP Location: Right Arm, Patient Position: Sitting, Cuff Size: Normal)   Pulse 75   Ht 5' 8 (1.727 m)   Wt 190 lb 6.4 oz (86.4 kg)   SpO2 97%   BMI 28.95 kg/m    Wt Readings from Last 3 Encounters:  08/14/24 190 lb 6.4 oz (86.4 kg)  06/30/24 191 lb (86.6 kg)  05/24/24 184 lb (83.5 kg)    GEN: Well nourished, well developed in no acute distress while sitting in chair.  NECK: No JVD; No carotid bruits CARDIAC: RRR, no murmurs, rubs, gallops RESPIRATORY:  Clear to auscultation without rales, wheezing or rhonchi  ABDOMEN: Soft, non-tender, non-distended EXTREMITIES:  trace edema; No deformity   ASSESSMENT AND PLAN: .   Primary hypertension Reports SBP in the 160s at home using wrist blood pressure cuff.  Discussed that wrist blood pressure cuff is not accurate and encouraged [place] BP cuff. BP this OV elevated 159/85 then repeat 158/78.  Discontinued amlodipine  due to LE edema, although less likely at this dose Ordered Coreg 6.25 mg twice daily Continue losartan   50 mg twice daily.  BP log for 1 month.  We discussed proper BP measurement.  If BP remains elevated then would consider either increasing Coreg or switching her losartan  to olmesartan. Encourage physical activity for 150 minutes per week and heart healthy low sodium diet. Discussed limiting sodium intake to < 2 grams daily.    LE edema ECHO 07/2024: EF 60-65%, moderate LVH, G1DD Reports LE edema for months. Denies any SOB and orthopnea.  Appears Euvolemic on exam.  D/C amlodipine  as above.  Offered Lasix  20 mg prn for LE edema but patient declined due to prior excessive urination on Lasix . Discussed to contact office if LE worsen or SOB develops.    Coronary artery disease, unspecified vessel or lesion type, unspecified whether angina present, unspecified whether native or transplanted heart Hyperlipidemia LDL goal <70 Previous left heart catheterization in 2018 was normal.  Nonobstructive coronary artery disease confirmed by coronary CT scan 2022.  Discussed coronary CTA that is pending. Patient would prefer to defer. Will to consider if symptoms worsen.  Lp(a) 119 in 2022. LDL 81 in 08/2024 Reports chronic unchanged chest pain described as throbbing that occurs with lying down associated with her heart beating in ears. Suspect noncardiac cause.  Denies anginal symptoms. No further ischemic evaluation at this time.  Continue Crestor  20 mg daily.  Not on ASA given chronic Eliquis  use.   Recurrent deep vein  thrombosis (DVT) (HCC) Continue Eliquis  per PCP   Type 2 diabetes mellitus with diabetic neuropathy, without long-term current use of insulin  (HCC)  A1C 6.8 in 12/2023 Currently managed by diet.     Dispo: Follow up in 4-6 weeks with [facility] or APP Discussed it was best to just follow with one cardiology team. Patient states that she plans to only follow with [facility].   Signed, Lorette CINDERELLA Kapur, PA-C

## 2024-08-14 ENCOUNTER — Ambulatory Visit: Payer: Self-pay | Attending: Nurse Practitioner | Admitting: Physician Assistant

## 2024-08-14 ENCOUNTER — Encounter: Payer: Self-pay | Admitting: Nurse Practitioner

## 2024-08-14 VITALS — BP 159/85 | HR 75 | Ht 68.0 in | Wt 190.4 lb

## 2024-08-14 DIAGNOSIS — E785 Hyperlipidemia, unspecified: Secondary | ICD-10-CM

## 2024-08-14 DIAGNOSIS — I2510 Atherosclerotic heart disease of native coronary artery without angina pectoris: Secondary | ICD-10-CM

## 2024-08-14 DIAGNOSIS — E1140 Type 2 diabetes mellitus with diabetic neuropathy, unspecified: Secondary | ICD-10-CM | POA: Diagnosis not present

## 2024-08-14 DIAGNOSIS — I10 Essential (primary) hypertension: Secondary | ICD-10-CM

## 2024-08-14 DIAGNOSIS — I82409 Acute embolism and thrombosis of unspecified deep veins of unspecified lower extremity: Secondary | ICD-10-CM

## 2024-08-14 MED ORDER — CARVEDILOL 6.25 MG PO TABS: 6.2500 mg | ORAL_TABLET | Freq: Two times a day (BID) | ORAL | 3 refills | Status: AC

## 2024-08-14 NOTE — Patient Instructions (Signed)
 Medication Instructions:  Stop Amlodipine  Start Carvedilol 6.25 mg twice daily  *If you need a refill on your cardiac medications before your next appointment, please call your pharmacy*  Lab Work: NONE ordered at this time of appointment   Testing/Procedures: NONE ordered at this time of appointment   Follow-Up: At [facility], you and your health needs are our priority.  As part of our continuing mission to provide you with exceptional heart care, our providers are all part of one team.  This team includes your primary Cardiologist (physician) and Advanced Practice Providers or APPs (Physician Assistants and Nurse Practitioners) who all work together to provide you with the care you need, when you need it.  Your next appointment:   4-6 month(s)  Provider:   [name], MD or APP    We recommend signing up for the patient portal called MyChart.  Sign up information is provided on this After Visit Summary.  MyChart is used to connect with patients for Virtual Visits (Telemedicine).  Patients are able to view lab/test results, encounter notes, upcoming appointments, etc.  Non-urgent messages can be sent to your provider as well.   To learn more about what you can do with MyChart, go to [email or web address].   Other Instructions Omron blood pressure cuff discussed. Please monitor blood pressure for 1 month.

## 2024-08-15 DIAGNOSIS — E782 Mixed hyperlipidemia: Secondary | ICD-10-CM | POA: Diagnosis not present

## 2024-08-15 DIAGNOSIS — I10 Essential (primary) hypertension: Secondary | ICD-10-CM | POA: Diagnosis not present

## 2024-08-15 DIAGNOSIS — I82402 Acute embolism and thrombosis of unspecified deep veins of left lower extremity: Secondary | ICD-10-CM | POA: Diagnosis not present

## 2024-08-15 DIAGNOSIS — I872 Venous insufficiency (chronic) (peripheral): Secondary | ICD-10-CM | POA: Diagnosis not present

## 2024-08-15 DIAGNOSIS — D692 Other nonthrombocytopenic purpura: Secondary | ICD-10-CM | POA: Diagnosis not present

## 2024-08-15 DIAGNOSIS — M069 Rheumatoid arthritis, unspecified: Secondary | ICD-10-CM | POA: Diagnosis not present

## 2024-08-15 DIAGNOSIS — K560 Paralytic ileus: Secondary | ICD-10-CM | POA: Diagnosis not present

## 2024-08-15 DIAGNOSIS — M349 Systemic sclerosis, unspecified: Secondary | ICD-10-CM | POA: Diagnosis not present

## 2024-08-15 DIAGNOSIS — D6869 Other thrombophilia: Secondary | ICD-10-CM | POA: Diagnosis not present

## 2024-08-23 ENCOUNTER — Other Ambulatory Visit: Payer: Self-pay | Admitting: Internal Medicine

## 2024-08-23 ENCOUNTER — Encounter: Payer: Self-pay | Admitting: *Deleted

## 2024-08-23 DIAGNOSIS — Z1231 Encounter for screening mammogram for malignant neoplasm of breast: Secondary | ICD-10-CM

## 2024-08-23 NOTE — Progress Notes (Signed)
 Diane [name]                                          MRN: [id number]   08/23/2024   The VBCI Quality Team Specialist reviewed this patient medical record for the purposes of chart review for care gap closure. The following were reviewed: chart review for care gap closure-transition of care:notification of admission, receipt of discharge, patient engagement, and medication reconciliation post discharge.    VBCI Quality Team

## 2024-08-23 NOTE — Progress Notes (Unsigned)
 JAMIKA SADEK                                          MRN: [id number]   08/23/2024   The VBCI Quality Team Specialist reviewed this patient medical record for the purposes of chart review for care gap closure. The following were reviewed: {CHL AMB VBCI QUALITY SPECIALIST REASON FOR REVIEW:[id number]}.    VBCI Quality Team

## 2024-08-29 DIAGNOSIS — Z96619 Presence of unspecified artificial shoulder joint: Secondary | ICD-10-CM | POA: Diagnosis not present

## 2024-08-29 DIAGNOSIS — T849XXA Unspecified complication of internal orthopedic prosthetic device, implant and graft, initial encounter: Secondary | ICD-10-CM | POA: Diagnosis not present

## 2024-09-03 DIAGNOSIS — M7671 Peroneal tendinitis, right leg: Secondary | ICD-10-CM | POA: Diagnosis not present

## 2024-09-03 DIAGNOSIS — M25871 Other specified joint disorders, right ankle and foot: Secondary | ICD-10-CM | POA: Diagnosis not present

## 2024-09-03 DIAGNOSIS — G5791 Unspecified mononeuropathy of right lower limb: Secondary | ICD-10-CM | POA: Diagnosis not present

## 2024-09-03 DIAGNOSIS — M778 Other enthesopathies, not elsewhere classified: Secondary | ICD-10-CM | POA: Diagnosis not present

## 2024-09-03 DIAGNOSIS — E119 Type 2 diabetes mellitus without complications: Secondary | ICD-10-CM | POA: Diagnosis not present

## 2024-09-04 DIAGNOSIS — M341 CR(E)ST syndrome: Secondary | ICD-10-CM | POA: Diagnosis not present

## 2024-09-04 DIAGNOSIS — I129 Hypertensive chronic kidney disease with stage 1 through stage 4 chronic kidney disease, or unspecified chronic kidney disease: Secondary | ICD-10-CM | POA: Diagnosis not present

## 2024-09-04 DIAGNOSIS — N181 Chronic kidney disease, stage 1: Secondary | ICD-10-CM | POA: Diagnosis not present

## 2024-09-04 DIAGNOSIS — E785 Hyperlipidemia, unspecified: Secondary | ICD-10-CM | POA: Diagnosis not present

## 2024-09-04 DIAGNOSIS — Z7901 Long term (current) use of anticoagulants: Secondary | ICD-10-CM | POA: Diagnosis not present

## 2024-09-04 DIAGNOSIS — K5900 Constipation, unspecified: Secondary | ICD-10-CM | POA: Diagnosis not present

## 2024-09-04 DIAGNOSIS — M0550 Rheumatoid polyneuropathy with rheumatoid arthritis of unspecified site: Secondary | ICD-10-CM | POA: Diagnosis not present

## 2024-09-04 DIAGNOSIS — E1122 Type 2 diabetes mellitus with diabetic chronic kidney disease: Secondary | ICD-10-CM | POA: Diagnosis not present

## 2024-09-04 DIAGNOSIS — M2010 Hallux valgus (acquired), unspecified foot: Secondary | ICD-10-CM | POA: Diagnosis not present

## 2024-09-04 DIAGNOSIS — M349 Systemic sclerosis, unspecified: Secondary | ICD-10-CM | POA: Diagnosis not present

## 2024-09-04 DIAGNOSIS — F329 Major depressive disorder, single episode, unspecified: Secondary | ICD-10-CM | POA: Diagnosis not present

## 2024-09-04 DIAGNOSIS — I209 Angina pectoris, unspecified: Secondary | ICD-10-CM | POA: Diagnosis not present

## 2024-09-04 DIAGNOSIS — I700 Atherosclerosis of aorta: Secondary | ICD-10-CM | POA: Diagnosis not present

## 2024-09-04 DIAGNOSIS — M545 Low back pain, unspecified: Secondary | ICD-10-CM | POA: Diagnosis not present

## 2024-09-04 DIAGNOSIS — M62838 Other muscle spasm: Secondary | ICD-10-CM | POA: Diagnosis not present

## 2024-09-04 DIAGNOSIS — D6949 Other primary thrombocytopenia: Secondary | ICD-10-CM | POA: Diagnosis not present

## 2024-09-04 DIAGNOSIS — E1142 Type 2 diabetes mellitus with diabetic polyneuropathy: Secondary | ICD-10-CM | POA: Diagnosis not present

## 2024-09-04 DIAGNOSIS — K760 Fatty (change of) liver, not elsewhere classified: Secondary | ICD-10-CM | POA: Diagnosis not present

## 2024-09-04 DIAGNOSIS — I4891 Unspecified atrial fibrillation: Secondary | ICD-10-CM | POA: Diagnosis not present

## 2024-09-11 DIAGNOSIS — R2689 Other abnormalities of gait and mobility: Secondary | ICD-10-CM | POA: Diagnosis not present

## 2024-09-11 DIAGNOSIS — M25511 Pain in right shoulder: Secondary | ICD-10-CM | POA: Diagnosis not present

## 2024-09-11 DIAGNOSIS — M79604 Pain in right leg: Secondary | ICD-10-CM | POA: Diagnosis not present

## 2024-09-11 DIAGNOSIS — G479 Sleep disorder, unspecified: Secondary | ICD-10-CM | POA: Diagnosis not present

## 2024-09-11 DIAGNOSIS — M25551 Pain in right hip: Secondary | ICD-10-CM | POA: Diagnosis not present

## 2024-09-11 DIAGNOSIS — I10 Essential (primary) hypertension: Secondary | ICD-10-CM | POA: Diagnosis not present

## 2024-09-11 DIAGNOSIS — Z599 Problem related to housing and economic circumstances, unspecified: Secondary | ICD-10-CM | POA: Diagnosis not present

## 2024-09-11 DIAGNOSIS — G912 (Idiopathic) normal pressure hydrocephalus: Secondary | ICD-10-CM | POA: Diagnosis not present

## 2024-09-11 DIAGNOSIS — R413 Other amnesia: Secondary | ICD-10-CM | POA: Diagnosis not present

## 2024-09-12 ENCOUNTER — Ambulatory Visit
Admission: RE | Admit: 2024-09-12 | Discharge: 2024-09-12 | Disposition: A | Discharge status: Home / Self Care | Source: Ambulatory Visit | Attending: Internal Medicine | Admitting: Internal Medicine

## 2024-09-12 DIAGNOSIS — Z1231 Encounter for screening mammogram for malignant neoplasm of breast: Secondary | ICD-10-CM

## 2024-09-25 ENCOUNTER — Encounter (HOSPITAL_COMMUNITY): Payer: Self-pay | Admitting: Emergency Medicine

## 2024-09-25 ENCOUNTER — Encounter (HOSPITAL_COMMUNITY): Payer: Self-pay

## 2024-09-25 ENCOUNTER — Ambulatory Visit (HOSPITAL_COMMUNITY)
Admission: EM | Admit: 2024-09-25 | Discharge: 2024-09-25 | Disposition: A | Discharge status: Home / Self Care | Attending: Family Medicine | Admitting: Family Medicine

## 2024-09-25 ENCOUNTER — Emergency Department (HOSPITAL_COMMUNITY)
Admission: EM | Admit: 2024-09-25 | Discharge: 2024-09-26 | Disposition: A | Discharge status: Home / Self Care | Attending: Emergency Medicine | Admitting: Emergency Medicine

## 2024-09-25 DIAGNOSIS — R0789 Other chest pain: Secondary | ICD-10-CM | POA: Diagnosis not present

## 2024-09-25 DIAGNOSIS — R1011 Right upper quadrant pain: Secondary | ICD-10-CM | POA: Diagnosis not present

## 2024-09-25 DIAGNOSIS — R102 Pelvic and perineal pain unspecified side: Secondary | ICD-10-CM | POA: Diagnosis not present

## 2024-09-25 DIAGNOSIS — R10A1 Flank pain, right side: Secondary | ICD-10-CM | POA: Diagnosis not present

## 2024-09-25 LAB — COMPREHENSIVE METABOLIC PANEL WITH GFR
ALT: 15 U/L (ref 0–44)
AST: 25 U/L (ref 15–41)
Albumin: 3.6 g/dL (ref 3.5–5.0)
Alkaline Phosphatase: 57 U/L (ref 38–126)
Anion gap: 13 (ref 5–15)
BUN: 9 mg/dL (ref 8–23)
CO2: 21 mmol/L — ABNORMAL LOW (ref 22–32)
Calcium: 9.1 mg/dL (ref 8.9–10.3)
Chloride: 107 mmol/L (ref 98–111)
Creatinine, Ser: 0.57 mg/dL (ref 0.44–1.00)
GFR, Estimated: >60 mL/min (ref >60)
Glucose, Bld: 105 mg/dL — ABNORMAL HIGH (ref 70–99)
Potassium: 4.3 mmol/L (ref 3.5–5.1)
Sodium: 141 mmol/L (ref 135–145)
Total Bilirubin: 0.6 mg/dL (ref 0.0–1.2)
Total Protein: 7.4 g/dL (ref 6.5–8.1)

## 2024-09-25 LAB — POCT URINALYSIS DIP (MANUAL ENTRY)
Bilirubin, UA: NEGATIVE
Blood, UA: NEGATIVE
Glucose, UA: NEGATIVE mg/dL
Ketones, POC UA: NEGATIVE mg/dL
Leukocytes, UA: NEGATIVE
Nitrite, UA: NEGATIVE
Protein Ur, POC: NEGATIVE mg/dL
Spec Grav, UA: 1.02 (ref 1.010–1.025)
Urobilinogen, UA: 0.2 U/dL
pH, UA: 5.5 (ref 5.0–8.0)

## 2024-09-25 LAB — LIPASE, BLOOD: Lipase: 52 U/L — ABNORMAL HIGH (ref 11–51)

## 2024-09-25 LAB — TROPONIN I (HIGH SENSITIVITY): Troponin I (High Sensitivity): 7 ng/L (ref <18)

## 2024-09-25 MED ORDER — HYDROMORPHONE HCL 1 MG/ML IJ SOLN
0.5000 mg | Freq: Once | INTRAMUSCULAR | Status: AC
  Administered 2024-09-26: 0.5 mg via INTRAVENOUS
  Filled 2024-09-25: qty 1

## 2024-09-25 NOTE — ED Provider Notes (Incomplete)
 [facility] Emergency Department Provider Note MRN:  [id number]  Arrival date & time: 09/25/24     Chief Complaint   Chest Pain   History of Present Illness   Diane [name] is a 78 y.o. year-old female presents to the ED with chief complaint of right side pain.  States that the pain is from just beneath the ribs and radiates into her back. She states that it has been going on for several months.  Has been using lidoderm  without much relief.  She does get some relief from a heating pad.  Has been taking gabapentin  for pain.  Denies any nausea or vomiting.  Denies constipation or diarrhea.  Denies any dysuria.  Denies any vaginal discharge.  States that she has had some intermittent vaginal bleeding that comes and goes on its own and is being managed by her doctor with watchful waiting.  Hx of cholecystectomy about 20 years ago.  States that she has had persistent pain in that spot.      History provided by patient. {RB interpreter (Optional):27221}  Review of Systems  Pertinent positive and negative review of systems noted in HPI.    Physical Exam   Vitals:   09/25/24 1948 09/25/24 2240  BP: (!) 210/92 (!) 183/93  Pulse: 100 (!) 59  Resp: 12 14  Temp: 98.2 F (36.8 C) 98.3 F (36.8 C)  SpO2: 100% 95%    CONSTITUTIONAL:  non toxic-appearing, NAD NEURO:  Alert and oriented x 3, CN 3-12 grossly intact EYES:  eyes equal and reactive ENT/NECK:  Supple, no stridor  CARDIO:  normal rate, regular rhythm, appears well-perfused  PULM:  No respiratory distress, CTAB GI/GU:  non-distended, *** MSK/SPINE:  No gross deformities, no edema, moves all extremities *** SKIN:  no rash, ***, atraumatic   *Additional and/or pertinent findings included in MDM below  Diagnostic and Interventional Summary    EKG Interpretation Date/Time:  Tuesday September 25 2024 20:26:03 EST Ventricular Rate:  76 PR Interval:  146 QRS Duration:  84 QT Interval:  420 QTC  Calculation: 472 R Axis:   37  Text Interpretation: Normal sinus rhythm Nonspecific T wave abnormality new Prolonged QT When compared with ECG of 24-May-2024 15:31, PREVIOUS ECG IS PRESENT Confirmed by Doretha Folks ([id number]) on 09/25/2024 10:24:49 PM      *** Labs Reviewed  COMPREHENSIVE METABOLIC PANEL WITH GFR - Abnormal; Notable for the following components:      Result Value   CO2 21 (*)    Glucose, Bld 105 (*)    All other components within normal limits  LIPASE, BLOOD - Abnormal; Notable for the following components:   Lipase 52 (*)    All other components within normal limits  CBC WITH DIFFERENTIAL/PLATELET  URINALYSIS, ROUTINE W REFLEX MICROSCOPIC  CBC WITH DIFFERENTIAL/PLATELET  TROPONIN I (HIGH SENSITIVITY)  TROPONIN I (HIGH SENSITIVITY)    CT ABDOMEN PELVIS W CONTRAST    (Results Pending)    Medications - No data to display   Procedures  /  Critical Care Procedures  ED Course and Medical Decision Making  I have reviewed the triage vital signs, the nursing notes, and pertinent available records from the EMR.  Social Determinants Affecting Complexity of Care: Patient {rbSocial Determinants:27067}. {rbsocialsolutions:27068}  ED Course:    Medical Decision Making Risk Prescription drug management.      {rbcpddx (Optional):29772:::1} {rbabdddx (Optional):29773:s::1}  Consultants: {rbconsultants:27072}   Treatment and Plan: {rbadmissionvdc:27069}  {rbattending:27073}  Final Clinical Impressions(s) / ED Diagnoses  No diagnosis found.  ED Discharge Orders     None         Discharge Instructions Discussed with and Provided to Patient:   Discharge Instructions   None

## 2024-09-25 NOTE — ED Triage Notes (Addendum)
 Patient reports that she has RUQ pain that radiates into the mid back x more than a week. Patient states that she called her PCP earlier in the week and was told to use a pain patch and had no relief.  Patient also reports that she woke this AM and her tongue was black. Patient denies any pain of her tongue.
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
## 2024-09-25 NOTE — ED Notes (Signed)
 Patient is being discharged from the Urgent Care and sent to the Emergency Department via private vehicle . Per provider, patient is in need of higher level of care due to RUQ pain that radiates to back. Patient is aware and verbalizes understanding of plan of care.  Vitals:   09/25/24 1548  BP: (!) 167/73  Pulse: 84  Resp: 16  Temp: 98.3 F (36.8 C)  SpO2: 93%

## 2024-09-25 NOTE — ED Provider Triage Note (Signed)
 Emergency Medicine Provider Triage Evaluation Note  Diane [name] , a 78 y.o. female  was evaluated in triage.  Pt complains of abd pain. Recurrent RUQ pain radiates to back for nearly a month.  Also report intermittent episodes of vaginal bleeding as well.  However has had hysterectomy.  No cp, sob, no fever, chills, n/v/d.  Has tried numerous OTC meds and patches for RUQ pain without relief.  Prior cholecystectomy.  Hx of HTN, took her meds  Review of Systems  Positive: As above Negative: As above  Physical Exam  BP (!) 210/92 (BP Location: Left Arm)   Pulse 100   Temp 98.2 F (36.8 C)   Resp 12   SpO2 100%  Gen:   Awake, no distress   Resp:  Normal effort  MSK:   Moves extremities without difficulty  Other:  Overall well appearing,  Medical Decision Making  Medically screening exam initiated at 8:24 PM.  Appropriate orders placed.  Diane [name] was informed that the remainder of the evaluation will be completed by another provider, this initial triage assessment does not replace that evaluation, and the importance of remaining in the ED until their evaluation is complete.  Doubt dissection or PE.  Concerns for malignancy   Diane [name], Diane [name] 09/25/24 2026

## 2024-09-25 NOTE — ED Provider Notes (Signed)
 [facility] Emergency Department Provider Note MRN:  [id number]  Arrival date & time: 09/26/24     Chief Complaint   Chest Pain   History of Present Illness   Diane [name] is a 78 y.o. year-old female presents to the ED with chief complaint of right side pain.  States that the pain is from just beneath the ribs and radiates into her back. She states that it has been going on for several months.  Has been using lidoderm  without much relief.  She does get some relief from a heating pad.  Has been taking gabapentin  for pain.  Denies any nausea or vomiting.  Denies constipation or diarrhea.  Denies any dysuria.  Denies any vaginal discharge.  States that she has had some intermittent vaginal bleeding that comes and goes on its own and is being managed by her doctor with watchful waiting.  Hx of cholecystectomy about 20 years ago.  States that she has had persistent pain in that spot.      History provided by patient.   Review of Systems  Pertinent positive and negative review of systems noted in HPI.    Physical Exam   Vitals:   09/26/24 0424 09/26/24 0425  BP:    Pulse: 65 72  Resp: 10 (!) 21  Temp:    SpO2: 97% 98%    CONSTITUTIONAL:  non toxic-appearing, NAD NEURO:  Alert and oriented x 3, CN 3-12 grossly intact EYES:  eyes equal and reactive ENT/NECK:  Supple, no stridor  CARDIO:  normal rate, regular rhythm, appears well-perfused  PULM:  No respiratory distress, CTAB GI/GU:  non-distended, non tender MSK/SPINE:  No gross deformities, no edema, moves all extremities  SKIN:  no rash, atraumatic   *Additional and/or pertinent findings included in MDM below  Diagnostic and Interventional Summary    EKG Interpretation Date/Time:  Tuesday September 25 2024 20:26:03 EST Ventricular Rate:  76 PR Interval:  146 QRS Duration:  84 QT Interval:  420 QTC Calculation: 472 R Axis:   37  Text Interpretation: Normal sinus rhythm Nonspecific T wave  abnormality new Prolonged QT When compared with ECG of 24-May-2024 15:31, PREVIOUS ECG IS PRESENT Confirmed by Doretha Folks ([id number]) on 09/25/2024 10:24:49 PM       Labs Reviewed  COMPREHENSIVE METABOLIC PANEL WITH GFR - Abnormal; Notable for the following components:      Result Value   CO2 21 (*)    Glucose, Bld 105 (*)    All other components within normal limits  LIPASE, BLOOD - Abnormal; Notable for the following components:   Lipase 52 (*)    All other components within normal limits  URINALYSIS, ROUTINE W REFLEX MICROSCOPIC - Abnormal; Notable for the following components:   Specific Gravity, Urine 1.034 (*)    All other components within normal limits  CBC WITH DIFFERENTIAL/PLATELET - Abnormal; Notable for the following components:   Hemoglobin 11.0 (*)    HCT 35.8 (*)    MCV 78.3 (*)    MCH 24.1 (*)    RDW 18.8 (*)    Platelets 134 (*)    All other components within normal limits  CBC WITH DIFFERENTIAL/PLATELET  CBC WITH DIFFERENTIAL/PLATELET  TROPONIN I (HIGH SENSITIVITY)  TROPONIN I (HIGH SENSITIVITY)    CT ABDOMEN PELVIS W CONTRAST  Final Result      Medications  HYDROmorphone  (DILAUDID ) injection 0.5 mg (0.5 mg Intravenous Given 09/26/24 0044)  iohexol  (OMNIPAQUE ) 350 MG/ML injection 75 mL (75 mLs  Intravenous Contrast Given 09/26/24 0114)     Procedures  /  Critical Care Procedures  ED Course and Medical Decision Making  I have reviewed the triage vital signs, the nursing notes, and pertinent available records from the EMR.  Social Determinants Affecting Complexity of Care: Patient has no clinically significant social determinants affecting this chief complaint..   ED Course:    Medical Decision Making Patient complains of right inferior rib pain.  She states that she has had this problem for several months, but after further discussion, she states that she has had this pain for many years.  She states it has been present ever since she had her  gallbladder taken out.  She is afebrile.  She does not appear toxic.  She normally takes gabapentin  for the pain, but states it does not help that much.  Labs ordered in triage notable for a mildly elevated lipase at 52, but CT that was also ordered in triage is negative for pancreatitis.  There is a nonocclusive mural thrombus within the left common iliac vein stent.  She is anticoagulated.  Will have her follow-up with vascular for this.  She does not have any central chest pain, no shortness of breath.  Her troponins are negative.  I doubt ACS.  No evidence of acute ischemic changes on EKG.  She is compliant with her Eliquis , I doubt PE.  She is not hypoxic, nor tachycardic.  Her symptoms do seem to be somewhat positional, and that she gets relief when she rests on a pillow.  However, she does not report any worsening pain when she takes a deep breath.  She does not have any rash or evidence of shingles.  No certain cause for the symptoms are identified tonight.  Recommend that she follow-up with her PCP.    Risk Prescription drug management.         Consultants: No consultations were needed in caring for this patient.   Treatment and Plan: I considered admission due to patient's initial presentation, but after considering the examination and diagnostic results, patient will not require admission and can be discharged with outpatient follow-up.    Final Clinical Impressions(s) / ED Diagnoses     ICD-10-CM   1. Right-sided chest wall pain  R07.89       ED Discharge Orders     None         Discharge Instructions Discussed with and Provided to Patient:     Discharge Instructions      Your tests look good tonight.  I recommend that you follow-up with your regular doctor.  Please monitor for fever or productive cough.  If you have persistent nausea or vomiting, shortness of breath, or worsening pain, I recommend that you return to the ER.  If you are not having  regular bowel movements, or if your stool is not soft, I recommend taking over-the-counter MiraLAX .       Vicky Charleston, PA-C 09/26/24 9460    Doretha Folks, MD 09/26/24 1640

## 2024-09-25 NOTE — Discharge Instructions (Signed)
 VISIT SUMMARY:  You came in today with severe right-sided abdominal and back pain that has been ongoing for the past six to seven days. You also reported experiencing pelvic pain and vaginal bleeding, despite having had a hysterectomy 22 years ago. You have a history of multiple abdominal surgeries and a possible hernia.  YOUR PLAN:  -HEMATURIA AND RIGHT FLANK PAIN, SUSPECTED KIDNEY STONE: Hematuria means blood in the urine, and your right flank pain suggests a possible kidney stone. We recommend you go to the ER for a CT scan to evaluate for a kidney stone and to assess the cause of the blood in your urine.  -RECURRENT ABDOMINAL WALL HERNIA: A recurrent abdominal wall hernia means that part of your intestine or other tissue is pushing through a weak spot in your abdominal muscles again. Given your history of multiple surgeries and scar tissue, further surgical intervention is complicated. We will monitor your condition and discuss further steps as needed.  INSTRUCTIONS:  Please go to the ER for a CT scan to evaluate for a kidney stone and to assess the cause of the blood in your urine. Follow up with us  after your ER visit to discuss the results and next steps.

## 2024-09-25 NOTE — ED Triage Notes (Signed)
 PT reports left sided rib pain for several months that is not relieved with lido patches or heating pad. Finger tips are white and unable to get pleth. Sats 100 % on earlobe.

## 2024-09-26 ENCOUNTER — Emergency Department (HOSPITAL_COMMUNITY)

## 2024-09-26 DIAGNOSIS — R1011 Right upper quadrant pain: Secondary | ICD-10-CM | POA: Diagnosis not present

## 2024-09-26 DIAGNOSIS — Z9049 Acquired absence of other specified parts of digestive tract: Secondary | ICD-10-CM | POA: Diagnosis not present

## 2024-09-26 LAB — CBC WITH DIFFERENTIAL/PLATELET
Abs Immature Granulocytes: 0.02 K/uL (ref 0.00–0.07)
Basophils Absolute: 0 K/uL (ref 0.0–0.1)
Basophils Relative: 0 %
Eosinophils Absolute: 0.1 K/uL (ref 0.0–0.5)
Eosinophils Relative: 1 %
HCT: 35.8 % — ABNORMAL LOW (ref 36.0–46.0)
Hemoglobin: 11 g/dL — ABNORMAL LOW (ref 12.0–15.0)
Immature Granulocytes: 0 %
Lymphocytes Relative: 34 %
Lymphs Abs: 1.9 K/uL (ref 0.7–4.0)
MCH: 24.1 pg — ABNORMAL LOW (ref 26.0–34.0)
MCHC: 30.7 g/dL (ref 30.0–36.0)
MCV: 78.3 fL — ABNORMAL LOW (ref 80.0–100.0)
Monocytes Absolute: 0.5 K/uL (ref 0.1–1.0)
Monocytes Relative: 9 %
Neutro Abs: 3.1 K/uL (ref 1.7–7.7)
Neutrophils Relative %: 56 %
Platelets: 134 K/uL — ABNORMAL LOW (ref 150–400)
RBC: 4.57 MIL/uL (ref 3.87–5.11)
RDW: 18.8 % — ABNORMAL HIGH (ref 11.5–15.5)
WBC: 5.7 K/uL (ref 4.0–10.5)
nRBC: 0 % (ref 0.0–0.2)

## 2024-09-26 LAB — TROPONIN I (HIGH SENSITIVITY): Troponin I (High Sensitivity): 8 ng/L (ref <18)

## 2024-09-26 LAB — URINALYSIS, ROUTINE W REFLEX MICROSCOPIC
Bacteria, UA: NONE SEEN
Bilirubin Urine: NEGATIVE
Glucose, UA: NEGATIVE mg/dL
Hgb urine dipstick: NEGATIVE
Ketones, ur: NEGATIVE mg/dL
Leukocytes,Ua: NEGATIVE
Nitrite: NEGATIVE
Protein, ur: NEGATIVE mg/dL
Specific Gravity, Urine: 1.034 — ABNORMAL HIGH (ref 1.005–1.030)
pH: 5 (ref 5.0–8.0)

## 2024-09-26 MED ORDER — IOHEXOL 350 MG/ML SOLN
75.0000 mL | Freq: Once | INTRAVENOUS | Status: AC | PRN
  Administered 2024-09-26: 75 mL via INTRAVENOUS

## 2024-09-26 NOTE — Discharge Instructions (Addendum)
 Your tests look good tonight.  I recommend that you follow-up with your regular doctor.  Please monitor for fever or productive cough.  If you have persistent nausea or vomiting, shortness of breath, or worsening pain, I recommend that you return to the ER.  If you are not having regular bowel movements, or if your stool is not soft, I recommend taking over-the-counter MiraLAX .

## 2024-12-17 ENCOUNTER — Ambulatory Visit: Admitting: Cardiovascular Disease
